# Patient Record
Sex: Female | Born: 1945 | Race: White | Hispanic: No | Marital: Married | Smoking: Former smoker
Health system: Southern US, Community
[De-identification: ages and names within clinical notes are randomized; demographics above are authoritative.]

## PROBLEM LIST (undated history)

## (undated) DIAGNOSIS — D649 Anemia, unspecified: Secondary | ICD-10-CM

## (undated) DIAGNOSIS — F419 Anxiety disorder, unspecified: Secondary | ICD-10-CM

## (undated) DIAGNOSIS — G4730 Sleep apnea, unspecified: Secondary | ICD-10-CM

## (undated) DIAGNOSIS — K219 Gastro-esophageal reflux disease without esophagitis: Secondary | ICD-10-CM

## (undated) DIAGNOSIS — Z9889 Other specified postprocedural states: Secondary | ICD-10-CM

## (undated) DIAGNOSIS — J45909 Unspecified asthma, uncomplicated: Secondary | ICD-10-CM

## (undated) DIAGNOSIS — J302 Other seasonal allergic rhinitis: Secondary | ICD-10-CM

## (undated) DIAGNOSIS — Z993 Dependence on wheelchair: Secondary | ICD-10-CM

## (undated) DIAGNOSIS — R112 Nausea with vomiting, unspecified: Secondary | ICD-10-CM

## (undated) DIAGNOSIS — I10 Essential (primary) hypertension: Secondary | ICD-10-CM

## (undated) HISTORY — DX: Essential (primary) hypertension: I10

## (undated) HISTORY — PX: CHOLECYSTECTOMY: SHX55

## (undated) HISTORY — PX: DILATION AND CURETTAGE OF UTERUS: SHX78

## (undated) HISTORY — PX: APPENDECTOMY: SHX54

## (undated) HISTORY — DX: Other seasonal allergic rhinitis: J30.2

## (undated) HISTORY — PX: TUMOR REMOVAL: SHX12

## (undated) HISTORY — PX: JOINT REPLACEMENT: SHX530

---

## 1991-02-01 HISTORY — PX: PARTIAL HYSTERECTOMY: SHX80

## 2004-07-08 ENCOUNTER — Ambulatory Visit: Payer: Self-pay | Admitting: Obstetrics and Gynecology

## 2005-07-11 ENCOUNTER — Ambulatory Visit: Payer: Self-pay | Admitting: Obstetrics and Gynecology

## 2006-01-31 HISTORY — PX: REPLACEMENT TOTAL KNEE: SUR1224

## 2006-09-25 ENCOUNTER — Ambulatory Visit: Payer: Self-pay | Admitting: General Practice

## 2006-09-25 ENCOUNTER — Other Ambulatory Visit: Payer: Self-pay

## 2006-10-11 ENCOUNTER — Inpatient Hospital Stay: Payer: Self-pay | Admitting: General Practice

## 2007-09-25 ENCOUNTER — Other Ambulatory Visit: Payer: Self-pay

## 2007-09-25 ENCOUNTER — Ambulatory Visit: Payer: Self-pay | Admitting: General Practice

## 2007-10-02 ENCOUNTER — Inpatient Hospital Stay: Payer: Self-pay | Admitting: General Practice

## 2008-02-11 ENCOUNTER — Ambulatory Visit: Payer: Self-pay | Admitting: Internal Medicine

## 2008-03-03 ENCOUNTER — Ambulatory Visit: Payer: Self-pay | Admitting: Internal Medicine

## 2011-09-23 ENCOUNTER — Ambulatory Visit: Payer: Self-pay | Admitting: Internal Medicine

## 2011-10-12 ENCOUNTER — Ambulatory Visit: Payer: Self-pay | Admitting: Cardiology

## 2012-07-30 ENCOUNTER — Ambulatory Visit: Payer: Self-pay | Admitting: Unknown Physician Specialty

## 2012-10-10 ENCOUNTER — Ambulatory Visit: Payer: Self-pay | Admitting: Family Medicine

## 2013-08-09 ENCOUNTER — Ambulatory Visit: Payer: Self-pay | Admitting: Specialist

## 2013-12-24 ENCOUNTER — Ambulatory Visit: Payer: Self-pay | Admitting: Family Medicine

## 2014-03-18 ENCOUNTER — Ambulatory Visit: Payer: Self-pay | Admitting: Family Medicine

## 2014-03-27 ENCOUNTER — Ambulatory Visit (INDEPENDENT_AMBULATORY_CARE_PROVIDER_SITE_OTHER): Payer: Medicare Other | Admitting: Internal Medicine

## 2014-03-27 ENCOUNTER — Encounter: Payer: Self-pay | Admitting: Internal Medicine

## 2014-03-27 ENCOUNTER — Encounter (INDEPENDENT_AMBULATORY_CARE_PROVIDER_SITE_OTHER): Payer: Self-pay

## 2014-03-27 VITALS — BP 124/84 | HR 67 | Temp 97.4°F | Ht 68.0 in | Wt 296.0 lb

## 2014-03-27 DIAGNOSIS — J4520 Mild intermittent asthma, uncomplicated: Secondary | ICD-10-CM

## 2014-03-27 DIAGNOSIS — J45909 Unspecified asthma, uncomplicated: Secondary | ICD-10-CM | POA: Insufficient documentation

## 2014-03-27 DIAGNOSIS — G4733 Obstructive sleep apnea (adult) (pediatric): Secondary | ICD-10-CM | POA: Insufficient documentation

## 2014-03-27 DIAGNOSIS — R918 Other nonspecific abnormal finding of lung field: Secondary | ICD-10-CM

## 2014-03-27 DIAGNOSIS — Z9989 Dependence on other enabling machines and devices: Secondary | ICD-10-CM

## 2014-03-27 NOTE — Patient Instructions (Signed)
Follow up with Dr. Stevenson Clinch in 1 month - methacholine challenge test, pulmonary function test, 6 minute walk test - stop asmanex for now - use albuterol 2puff as needed for shortness of breath\wheezing and coughing every 4-6 hours - if you have to use albuterol for more that 3 consecutive days then restart asmanex 1 inhalation twice per day, rinse and gargle after each use.  - continue with Nystatin swish for thrush.

## 2014-03-31 DIAGNOSIS — R918 Other nonspecific abnormal finding of lung field: Secondary | ICD-10-CM | POA: Insufficient documentation

## 2014-03-31 NOTE — Assessment & Plan Note (Signed)
Encouraged proper weight management.  Excessive weight may contribute to snoring.  Monitor sedative use.  Discussed driving precautions and its relationship with hypersomnolence.  Discussed operating dangerous equipment and its relationship with hypersomnolence.  Discussed sleep hygiene, and benefits of a fixed sleep waked time.  The importance of getting eight or more hours of sleep discussed with patient.  Discussed limiting the use of the computer and television before bedtime.  Decrease naps during the day, so night time sleep will become enhanced.  Limit caffeine, and sleep deprivation.  HTN, stroke, and heart failure are potential risk factors.  Encouraged continued use of CPAP machine 7 nights per week 6-8 hours per night.

## 2014-03-31 NOTE — Progress Notes (Signed)
Date: 03/31/2014  MRN# [id number] Jasmine Morrow 1945/04/29  Referring Physician:   ARDEN AXON is a 69 y.o. old female seen in consultation for establish care of asthma CC: "had asthma in thrush recently" Chief Complaint  Patient presents with  . Advice Only    Pt was dx asthma, she had a repeat ct scan. Pt denies cough, sob, and wheeze. She does have sl. chest tightness.  Synopsis: 69 year old female with clinical diagnosis of asthma, normal spirometry, seen in pulmonary [place] February 2016 for establish care. History of recent mild intermittent asthma, steroid responsive, however, with inhaled steroids gets thrush easily. Current plan: Methacholine challenge testing, pulmonary function tests, 6 minute walk test, when necessary albuterol.  HPI:   She is a pleasant 69 year old female presenting today for establish care visit of a current diagnosis of asthma. History per patient and review of records. Per review of records patient had 4 episodes of coughing and wheezing with recurrent bouts of bronchitis from December 2014 07/02/2013. Patient is being followed by Dr. Vella Kohler who diagnosed her with asthma, based on clinical state and symptoms. The differential was also extension tube dysfunction, chest x-ray did show a questionable tiny left nodular density in June 2015. Patient was referred to ENT for further evaluation, for which she's been following Dr. Tami Ribas allergies and further evaluation of eustactacian tube dysfunction. Patient states that since June she has tried different inhalers for her asthma which include Advair, Symbicort,  Breo.  Patient stated that she got the most clinical benefit from [facility], but would develop thrush in her throat and homicidal her left. Currently she is on Asmanex and nystatin rinse. It is noted that each time she was placed on a steroid inhaler she did have significant improvement cough and wheezing. Patient did have allergy testing performed by ENT, it  was negative, she does have seasonal allergies and is currently on Zyrtec. Patient has a history of previous tobacco use, 2 packs per day x30 years, quit in 1992. She was previously employed doing accounting type work. Patient also has a history of obstructive sleep apnea is currently on CPAP. Patient states that she has not used Asmanex for the past 3 days due to not having any symptoms of shortness of breath, cough, wheezing, also with improvement in thrush of her tongue. Patient states that she uses her CPAP nightly for 4-6 hours. Patient is a recent CT scan done for left upper lobe nodule: New results are significant for stable small subpleural left upper lobe nodule, appears somewhat elongated on the coronal images, there is stable nodular thickening along the right minor fissure, benign appearance, scarring in the lingula has been unchanged, 4 mm groundglass nodule in the left lower lobe is unchanged.  PMHX:   Past Medical History  Diagnosis Date  . Hypertension   . Seasonal allergies    Surgical Hx:  Past Surgical History  Procedure Laterality Date  . Replacement total knee Bilateral 2008    2009  . Cesarean section    . Tumor removal    . Dilation and curettage of uterus     Family Hx:  Family History  Problem Relation Age of Onset  . Bone cancer Mother   . COPD Father    Social Hx:   History  Substance Use Topics  . Smoking status: Former Smoker -- 2.00 packs/day for 30 years    Types: Cigarettes  . Smokeless tobacco: Never Used  . Alcohol Use: No   Medication:  Current Outpatient Rx  Name  Route  Sig  Dispense  Refill  . acetaminophen (TYLENOL) 500 MG tablet   Oral   Take 500 mg by mouth every 6 (six) hours as needed.         Marland Kitchen albuterol (PROVENTIL HFA;VENTOLIN HFA) 108 (90 BASE) MCG/ACT inhaler   Inhalation   Inhale 2 puffs into the lungs every 4 (four) hours as needed.         Marland Kitchen aspirin EC 81 MG tablet   Oral   Take 1 tablet by mouth daily.          Marland Kitchen BREO ELLIPTA 100-25 MCG/INH AEPB   Inhalation   Inhale 100 mcg into the lungs daily.      0     Dispense as written.   . Calcium-Vitamin D 600-200 MG-UNIT per tablet   Oral   Take 1 tablet by mouth 2 (two) times daily.         . carvedilol (COREG) 6.25 MG tablet   Oral   Take 1 tablet by mouth daily.      0   . Cetirizine HCl 10 MG CAPS   Oral   Take 1 tablet by mouth daily.         Marland Kitchen estradiol (ESTRACE) 0.1 MG/GM vaginal cream   Vaginal   Place 2 g vaginally 2 (two) times a week.         . fluticasone (FLONASE) 50 MCG/ACT nasal spray   Each Nare   Place 1 spray into both nostrils daily.         Marland Kitchen triamcinolone ointment (KENALOG) 0.1 %   Topical   Apply 1 application topically 2 (two) times daily.         Marland Kitchen triamterene-hydrochlorothiazide (MAXZIDE-25) 37.5-25 MG per tablet   Oral   Take 1 tablet by mouth daily.      0       Allergies:  Celecoxib; Hydrocodone-acetaminophen; Meloxicam; Morphine sulfate er beads; Naproxen; Other; Rofecoxib; Ciprofloxacin-hydrocortisone; Nickel; and Tramadol-acetaminophen  Review of Systems: Gen:  Denies  fever, sweats, chills HEENT: Denies blurred vision, double vision, ear pain, eye pain, hearing loss, nose bleeds, sore throat Cvc:  No dizziness, chest pain or heaviness Resp:   Intermittent cough and shortness of breath Gi: Denies swallowing difficulty, stomach pain, nausea or vomiting, diarrhea, constipation, bowel incontinence Gu:  Denies bladder incontinence, burning urine Ext:   No Joint pain, stiffness or swelling Skin: No skin rash, easy bruising or bleeding or hives Endoc:  No polyuria, polydipsia , polyphagia or weight change Psych: No depression, insomnia or hallucinations  Other:  All other systems negative  Physical Examination:   VS: BP 124/84 mmHg  Pulse 67  Temp(Src) 97.4 F (36.3 C) (Oral)  Ht 5\' 8"  (1.727 m)  Wt 296 lb (134.265 kg)  BMI 45.02 kg/m2  SpO2 98%  General Appearance: No  distress  Neuro:without focal findings, mental status, speech normal, alert and oriented, cranial nerves 2-12 intact, reflexes normal and symmetric, sensation grossly normal  HEENT: PERRLA, EOM intact, no ptosis, no other lesions noticed; Mallampati 2.  Tongue: Mild thrush left lateral tongue Pulmonary: normal breath sounds., diaphragmatic excursion normal.No wheezing, No rales;   Sputum Production:  None CardiovascularNormal S1,S2.  No m/r/g.  Abdominal aorta pulsation normal.    Abdomen: Benign, Soft, non-tender, No masses, hepatosplenomegaly, No lymphadenopathy Renal:  No costovertebral tenderness  GU:  No performed at this time. Endoc: No evident thyromegaly, no signs of acromegaly or Cushing  features Skin:   warm, no rashes, no ecchymosis  Extremities: normal, no cyanosis, clubbing, no edema, warm with normal capillary refill. Other findings:None    Rad results: (The following images and results were reviewed by Dr. Stevenson Clinch). CT chest June 2015 FINDINGS: There is an indeterminate approximately 6 mm noncalcified nodule within the left upper lobe (image 20, series 4). There is a less well-defined approximately 4 mm ground-glass nodule within the right upper lobe (image 31, series 4).  There is minimal subsegmental atelectasis within the medial aspect of the left upper lobe (image 27, series 4 as with well as within the medial basilar segment of the right lower lobe (representative image 33, series 4). No discrete focal airspace opacities. No pleural effusion or pneumothorax. The central pulmonary airways are widely patent. No mediastinal, hilar axillary lymphadenopathy on this noncontrast examination.  Normal heart size. Minimal coronary artery calcifications. No pericardial effusion. Normal caliber of the thoracic aorta.  Limited noncontrast evaluation of the upper abdomen demonstrates a small splenule within the splenic hilum but is otherwise unremarkable.  No acute or  aggressive osseous abnormalities. Stigmata of dish within the caudal aspect of the thoracic spine.  There is an approximately 2.4 x 2.2 x 1.8 cm hypo attenuating nodule arising from the inferior medial aspect of the left lobe of the thyroid/thyroid isthmus (axial image 6, series 2, coronal image 74, series 5).  IMPRESSION: 1. No acute cardiopulmonary disease. 2. Indeterminate bilateral pulmonary nodules the largest of which within the left upper lobe measures approximately 6 mm in diameter. If the patient is at high risk for bronchogenic carcinoma, follow-up chest CT at 6-12 months is recommended. If the patient is at low risk for bronchogenic carcinoma, follow-up chest CT at 12 months is recommended. This recommendation follows the consensus statement: Guidelines for Management of Small Pulmonary Nodules Detected on CT Scans: A Statement from the [place] as published in Radiology 2005;237:395-400. 3. Suspected approximately 2.4 cm indeterminate nodule arising from the inferior medial aspect of the left lobe of the thyroid/thyroid isthmus. Further evaluation could be performed with dedicated thyroid ultrasound as clinically indicated.  CT chest February 2016 EXAM: CT CHEST WITHOUT CONTRAST  TECHNIQUE: Multidetector CT imaging of the chest was performed following the standard protocol without IV contrast..  COMPARISON: Radiographs 09/25/2006. Chest CT 08/09/2013.  FINDINGS: Mediastinum/Nodes: There are no enlarged mediastinal, hilar or axillary lymph nodes. 1.6 cm low-density lesion involving the left thyroid isthmus on image number 7 is similar to the prior study. The trachea and esophagus demonstrate no significant findings. The heart size is normal. There is no pericardial effusion.Stable mild atherosclerosis of the aorta, great vessels and coronary arteries.  Lungs/Pleura: There is no pleural effusion. Stable small subpleural left upper lobe nodule on images  20 and 21. This appears somewhat elongated on the coronal images. There is stable nodular thickening along the right minor fissure, characteristically benign. Linear scarring in the lingula is unchanged. 4 mm ground-glass nodule in the left lower lobe on image 45 is also unchanged. No new or enlarging nodules identified.  Upper abdomen: Hepatic steatosis. No adrenal mass or acute findings.  Musculoskeletal/Chest wall: There is no chest wall mass or suspicious osseous finding.  IMPRESSION: 1. Stable linear scarring and mild nodularity in both lungs compared with prior study from 7 months ago. 2. Follow-up noncontrast CT in 1 year recommended to better assess stability. This recommendation follows the consensus statement: Guidelines for Management of Small Pulmonary Nodules Detected on CT Scans: A Statement  from the [place] as published in Radiology 2005; 237:395-400. 3. No acute findings or adenopathy. 4. Stable low-density lesion in the thyroid isthmus. 5. Hepatic steatosis.      Other:  07/30/13 SPIROMETRY: FVC was 3.44 liters, 115% of predicted FEV1 was 2.81, 118% of predicted FEV1 ratio was 82 FEF 25-75% liters per second was 130% of predicted Interpretation: normal spirometry  Assessment and Plan: Multiple lung nodules Patient with subcentimeter nodules. Stable nodularity in size and characteristic over the past one year,Will plan for followup CAT scan in one year   OSA on CPAP Encouraged proper weight management.  Excessive weight may contribute to snoring.  Monitor sedative use.  Discussed driving precautions and its relationship with hypersomnolence.  Discussed operating dangerous equipment and its relationship with hypersomnolence.  Discussed sleep hygiene, and benefits of a fixed sleep waked time.  The importance of getting eight or more hours of sleep discussed with patient.  Discussed limiting the use of the computer and television before bedtime.   Decrease naps during the day, so night time sleep will become enhanced.  Limit caffeine, and sleep deprivation.  HTN, stroke, and heart failure are potential risk factors.  Encouraged continued use of CPAP machine 7 nights per week 6-8 hours per night.     Extrinsic asthma Based on history patient most likely has asthma which is developing later in life, however this could be mixed with a component of COPD. On her most recent CT chest there is no noted structural disease to suggest emphysema bronchiectasis. Asthma seems very likely given her improvement in clinical symptoms when she is using an inhaled corticosteroid.  However, her symptoms have been very mild recently and with the use of the ICS she develops thrush fairly quickly even with proper mouth care. At this time. Inhale corticosteroid and downgrade treatment to as needed rescue inhaler and perform bronchoprovocation testing.  Plan: -Methacholine challenge testing, pulmonary function testing, 6 minute walk test -albuterol 2puff as needed for shortness of breath\wheezing and coughing every 4-6 hours -continue with nystatin rinse for 3 days after thrush has cleared.      Updated Medication List Outpatient Encounter Prescriptions as of 03/27/2014  Medication Sig  . acetaminophen (TYLENOL) 500 MG tablet Take 500 mg by mouth every 6 (six) hours as needed.  Marland Kitchen albuterol (PROVENTIL HFA;VENTOLIN HFA) 108 (90 BASE) MCG/ACT inhaler Inhale 2 puffs into the lungs every 4 (four) hours as needed.  Marland Kitchen aspirin EC 81 MG tablet Take 1 tablet by mouth daily.  Marland Kitchen BREO ELLIPTA 100-25 MCG/INH AEPB Inhale 100 mcg into the lungs daily.  . Calcium-Vitamin D 600-200 MG-UNIT per tablet Take 1 tablet by mouth 2 (two) times daily.  . carvedilol (COREG) 6.25 MG tablet Take 1 tablet by mouth daily.  . Cetirizine HCl 10 MG CAPS Take 1 tablet by mouth daily.  Marland Kitchen estradiol (ESTRACE) 0.1 MG/GM vaginal cream Place 2 g vaginally 2 (two) times a week.  .  fluticasone (FLONASE) 50 MCG/ACT nasal spray Place 1 spray into both nostrils daily.  Marland Kitchen triamcinolone ointment (KENALOG) 0.1 % Apply 1 application topically 2 (two) times daily.  Marland Kitchen triamterene-hydrochlorothiazide (MAXZIDE-25) 37.5-25 MG per tablet Take 1 tablet by mouth daily.    Orders for this visit: No orders of the defined types were placed in this encounter.     Thank  you for the consultation and for allowing [facility] to assist in the care of your patient. Our recommendations are noted above.  Please contact  us if we can be of further service.   Vilinda Boehringer, MD [facility] Office Number: [phone]

## 2014-03-31 NOTE — Assessment & Plan Note (Signed)
Patient with subcentimeter nodules. Stable nodularity in size and characteristic over the past one year,Will plan for followup CAT scan in one year

## 2014-03-31 NOTE — Assessment & Plan Note (Addendum)
Based on history patient most likely has asthma which is developing later in life, however this could be mixed with a component of COPD. On her most recent CT chest there is no noted structural disease to suggest emphysema bronchiectasis. Asthma seems very likely given her improvement in clinical symptoms when she is using an inhaled corticosteroid.  However, her symptoms have been very mild recently and with the use of the ICS she develops thrush fairly quickly even with proper mouth care. At this time. Inhale corticosteroid and downgrade treatment to as needed rescue inhaler and perform bronchoprovocation testing.  Plan: -Methacholine challenge testing, pulmonary function testing, 6 minute walk test -albuterol 2puff as needed for shortness of breath\wheezing and coughing every 4-6 hours -continue with nystatin rinse for 3 days after thrush has cleared.

## 2014-04-02 ENCOUNTER — Telehealth: Payer: Self-pay | Admitting: *Deleted

## 2014-04-02 ENCOUNTER — Encounter: Payer: Self-pay | Admitting: Internal Medicine

## 2014-04-02 DIAGNOSIS — J4520 Mild intermittent asthma, uncomplicated: Secondary | ICD-10-CM

## 2014-04-02 NOTE — Telephone Encounter (Signed)
Error

## 2014-04-17 ENCOUNTER — Ambulatory Visit: Payer: Self-pay | Admitting: Internal Medicine

## 2014-04-17 LAB — PULMONARY FUNCTION TEST

## 2014-04-28 ENCOUNTER — Ambulatory Visit: Payer: Medicare Other | Admitting: Internal Medicine

## 2014-05-08 ENCOUNTER — Encounter: Payer: Self-pay | Admitting: Internal Medicine

## 2014-05-09 ENCOUNTER — Other Ambulatory Visit: Payer: Self-pay | Admitting: *Deleted

## 2014-05-09 DIAGNOSIS — J4520 Mild intermittent asthma, uncomplicated: Secondary | ICD-10-CM

## 2014-05-13 ENCOUNTER — Telehealth: Payer: Self-pay | Admitting: *Deleted

## 2014-05-13 ENCOUNTER — Ambulatory Visit (INDEPENDENT_AMBULATORY_CARE_PROVIDER_SITE_OTHER): Payer: Medicare Other | Admitting: Internal Medicine

## 2014-05-13 ENCOUNTER — Encounter: Payer: Self-pay | Admitting: Internal Medicine

## 2014-05-13 VITALS — BP 120/80 | HR 70 | Ht 68.0 in | Wt 300.0 lb

## 2014-05-13 DIAGNOSIS — R0600 Dyspnea, unspecified: Secondary | ICD-10-CM | POA: Diagnosis not present

## 2014-05-13 DIAGNOSIS — R918 Other nonspecific abnormal finding of lung field: Secondary | ICD-10-CM

## 2014-05-13 DIAGNOSIS — Z9989 Dependence on other enabling machines and devices: Secondary | ICD-10-CM

## 2014-05-13 DIAGNOSIS — G4733 Obstructive sleep apnea (adult) (pediatric): Secondary | ICD-10-CM

## 2014-05-13 DIAGNOSIS — J4520 Mild intermittent asthma, uncomplicated: Secondary | ICD-10-CM

## 2014-05-13 DIAGNOSIS — R0602 Shortness of breath: Secondary | ICD-10-CM

## 2014-05-13 LAB — PULMONARY FUNCTION TEST
DL/VA % PRED: 97 %
DL/VA: 5.11 ml/min/mmHg/L
DLCO UNC % PRED: 100 %
DLCO UNC: 29.75 ml/min/mmHg
FEF 25-75 Post: 1.53 L/sec
FEF 25-75 Pre: 3.56 L/sec
FEF2575-%Change-Post: -56 %
FEF2575-%PRED-PRE: 159 %
FEF2575-%Pred-Post: 68 %
FEV1-%CHANGE-POST: -27 %
FEV1-%Pred-Post: 73 %
FEV1-%Pred-Pre: 101 %
FEV1-PRE: 2.76 L
FEV1-Post: 2 L
FEV1FVC-%Change-Post: -25 %
FEV1FVC-%Pred-Pre: 110 %
FEV6-%Change-Post: -2 %
FEV6-%PRED-POST: 93 %
FEV6-%Pred-Pre: 95 %
FEV6-POST: 3.2 L
FEV6-Pre: 3.28 L
FEV6FVC-%PRED-POST: 104 %
FEV6FVC-%Pred-Pre: 104 %
FVC-%Change-Post: -2 %
FVC-%PRED-PRE: 91 %
FVC-%Pred-Post: 89 %
FVC-POST: 3.2 L
FVC-Pre: 3.28 L
POST FEV6/FVC RATIO: 100 %
Post FEV1/FVC ratio: 62 %
Pre FEV1/FVC ratio: 84 %
Pre FEV6/FVC Ratio: 100 %
RV % pred: 91 %
RV: 2.16 L
TLC % pred: 102 %
TLC: 5.79 L

## 2014-05-13 NOTE — Assessment & Plan Note (Signed)
Patient with positive methacholine challenge testing, currently with no obstruction of a pulmonary function test. She does have some mild cough and postnasal drip secondary to allergies for which she is on Zyrtec. Her ENT had previously giving her non-powder inhalers, Asmanex, for which she will start using given home confirmed diagnosis of asthma with a positive methacholine challenge testing. I believe during allergy season her asthma may need controller medication otherwise she is very mild intermittent asthma and only may need a bronchodilator such as albuterol as needed.  Plan: -Asmanex 1 puff twice a day, during high allergy season, otherwise will plan for as needed bronchodilator otherwise. -Continue with allergy control with Zyrtec

## 2014-05-13 NOTE — Progress Notes (Signed)
PFT performed today. 

## 2014-05-13 NOTE — Progress Notes (Signed)
SMW performed today. 

## 2014-05-13 NOTE — Assessment & Plan Note (Signed)
Encouraged proper weight management.  Excessive weight may contribute to snoring.  Monitor sedative use.  Discussed driving precautions and its relationship with hypersomnolence.  Discussed operating dangerous equipment and its relationship with hypersomnolence.  Discussed sleep hygiene, and benefits of a fixed sleep waked time.  The importance of getting eight or more hours of sleep discussed with patient.  Discussed limiting the use of the computer and television before bedtime.  Decrease naps during the day, so night time sleep will become enhanced.  Limit caffeine, and sleep deprivation.  HTN, stroke, and heart failure are potential risk factors.  Encouraged continued use of CPAP machine 7 nights per week 6-8 hours per night.

## 2014-05-13 NOTE — Assessment & Plan Note (Signed)
Patient with subcentimeter nodules. Stable nodularity in size and characteristic over the past one year,Will plan for followup CAT scan in 03/2015

## 2014-05-13 NOTE — Progress Notes (Signed)
MRN# [id number] Jasmine Morrow Jul 22, 1945   CC: Chief Complaint  Patient presents with  . Follow-up    PFT results; SOB at times w/activity      Brief History: Synopsis: 69 year old female with clinical diagnosis of asthma, normal spirometry, seen in pulmonary [place] February 2016 for establish care. History of recent mild intermittent asthma, steroid responsive, however, with inhaled steroids gets thrush easily. Current plan: Methacholine challenge testing, pulmonary function tests, 6 minute walk test, when necessary albuterol.  Brief HPI 03/31/14:She is a pleasant 69 year old female presenting today for establish care visit of a current diagnosis of asthma. History per patient and review of records. 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  Hx of seasonal allergies currently on Zyrtec.  Develops thrush with different inhalers. Currently on intermittent Asmanex  Events since last clinic visit: Bains presents today for follow-up visit of her suspected asthma, and cough. Since her last visit she's had a methacholine challenge test, 6 minute walk test, pulmonary function tests for which she would like to discuss today. Today she endorses that her allergies are acting up and she does have a mild cough, and "tickle" in her throat. Patient is currently using Zyrtec daily.   PMHX:   Past Medical History  Diagnosis Date  . Hypertension   . Seasonal allergies    Surgical Hx:  Past Surgical History  Procedure Laterality Date  . Replacement total knee Bilateral 2008    2009  . Cesarean section    . Tumor removal    . Dilation and curettage of uterus     Family Hx:  Family History  Problem Relation Age of Onset  . Bone cancer Mother   . COPD Father    Social Hx:   History  Substance Use Topics  . Smoking status: Former Smoker -- 2.00 packs/day for 30 years    Types: Cigarettes   . Smokeless tobacco: Never Used  . Alcohol Use: No   Medication:   Current Outpatient Rx  Name  Route  Sig  Dispense  Refill  . acetaminophen (TYLENOL) 500 MG tablet   Oral   Take 500 mg by mouth every 6 (six) hours as needed.         Marland Kitchen albuterol (PROVENTIL HFA;VENTOLIN HFA) 108 (90 BASE) MCG/ACT inhaler   Inhalation   Inhale 2 puffs into the lungs every 4 (four) hours as needed.         Marland Kitchen aspirin EC 81 MG tablet   Oral   Take 1 tablet by mouth daily.         Marland Kitchen BREO ELLIPTA 100-25 MCG/INH AEPB   Inhalation   Inhale 100 mcg into the lungs daily.      0     Dispense as written.   . Calcium-Vitamin D 600-200 MG-UNIT per tablet   Oral   Take 1 tablet by mouth 2 (two) times daily.         . carvedilol (COREG) 6.25 MG tablet   Oral   Take 1 tablet by mouth daily.      0   . Cetirizine HCl 10 MG CAPS   Oral   Take 1 tablet by mouth daily.         Marland Kitchen estradiol (ESTRACE) 0.1 MG/GM vaginal cream   Vaginal   Place 2 g vaginally 2 (two) times a week.         Marland Kitchen  fluticasone (FLONASE) 50 MCG/ACT nasal spray   Each Nare   Place 1 spray into both nostrils daily.         Marland Kitchen triamcinolone ointment (KENALOG) 0.1 %   Topical   Apply 1 application topically 2 (two) times daily.         Marland Kitchen triamterene-hydrochlorothiazide (MAXZIDE-25) 37.5-25 MG per tablet   Oral   Take 1 tablet by mouth daily.      0      Review of Systems: Gen:  Denies  fever, sweats, chills HEENT: Denies blurred vision, double vision, ear pain, eye pain, hearing loss, nose bleeds, sore throat Cvc:  No dizziness, chest pain or heaviness Resp:   Mild cough and throat clearing.  Gi: Denies swallowing difficulty, stomach pain, nausea or vomiting, diarrhea, constipation, bowel incontinence Gu:  Denies bladder incontinence, burning urine Ext:   No Joint pain, stiffness or swelling Skin: No skin rash, easy bruising or bleeding or hives Endoc:  No polyuria, polydipsia , polyphagia or weight  change Psych: No depression, insomnia or hallucinations  Other:  All other systems negative  Allergies:  Celecoxib; Hydrocodone-acetaminophen; Meloxicam; Morphine sulfate er beads; Naproxen; Other; Rofecoxib; Ciprofloxacin-hydrocortisone; Nickel; and Tramadol-acetaminophen  Physical Examination:  VS: BP 120/80 mmHg  Pulse 70  Ht 5\' 8"  (1.727 m)  Wt 300 lb (136.079 kg)  BMI 45.63 kg/m2  SpO2 99%  General Appearance: No distress  Neuro: EXAM: without focal findings, mental status, speech normal, alert and oriented, cranial nerves 2-12 grossly normal  HEENT: PERRLA, EOM intact, no ptosis, no other lesions noticed Pulmonary:Exam: normal breath sounds., diaphragmatic excursion normal.No wheezing, No rales   Cardiovascular:@ Exam:  Normal S1,S2.  No m/r/g.     Abdomen:Exam: Benign, Soft, non-tender, No masses  Skin:   warm, no rashes, no ecchymosis  Extremities: normal, no cyanosis, clubbing, no edema, warm with normal capillary refill.   Labs results:  BMP No results found for: NA, K, CL, CO2, GLUCOSE, BUN, CREATININE   CBC No flowsheet data found.   Methacholine Challenge: Spirometry: FVC: 3.4L 102%predicted FEV1: 2.4L 101%predicted FEV1/FVC:.   Comments: Patient Underwent Methacholine Challenge testing and patient was given subsequent doses to assess for reactive airways and a change form baseline FEV1. It is evidecnt that there was a significant change in FEV1 after 4th dose.   Assessment/Impression: This suggests Positive Methacholine [place] Test.   Pulmonary function testing 05/13/2014 FVC 91% FEV1 101% FEV1/FVC 84, 110% ERV 24 DLCO 100 Impression: Normal PFTs, no obstruction or restriction.  6 minute walk test-258 m/846 feet no desaturations below 88%, highest heart rate 82.  Assessment and Plan:68 yo female presents for follow up of cough and dyspnea. Extrinsic asthma Patient with positive methacholine challenge testing, currently with no obstruction of a  pulmonary function test. She does have some mild cough and postnasal drip secondary to allergies for which she is on Zyrtec. Her ENT had previously giving her non-powder inhalers, Asmanex, for which she will start using given home confirmed diagnosis of asthma with a positive methacholine challenge testing. I believe during allergy season her asthma may need controller medication otherwise she is very mild intermittent asthma and only may need a bronchodilator such as albuterol as needed.  Plan: -Asmanex 1 puff twice a day, during high allergy season, otherwise will plan for as needed bronchodilator otherwise. -Continue with allergy control with Zyrtec     OSA on CPAP Encouraged proper weight management.  Excessive weight may contribute to snoring.  Monitor sedative use.  Discussed driving precautions and its relationship with hypersomnolence.  Discussed operating dangerous equipment and its relationship with hypersomnolence.  Discussed sleep hygiene, and benefits of a fixed sleep waked time.  The importance of getting eight or more hours of sleep discussed with patient.  Discussed limiting the use of the computer and television before bedtime.  Decrease naps during the day, so night time sleep will become enhanced.  Limit caffeine, and sleep deprivation.  HTN, stroke, and heart failure are potential risk factors.  Encouraged continued use of CPAP machine 7 nights per week 6-8 hours per night.       Multiple lung nodules Patient with subcentimeter nodules. Stable nodularity in size and characteristic over the past one year,Will plan for followup CAT scan in 03/2015       Updated Medication List Outpatient Encounter Prescriptions as of 05/13/2014  Medication Sig  . acetaminophen (TYLENOL) 500 MG tablet Take 500 mg by mouth every 6 (six) hours as needed.  Marland Kitchen albuterol (PROVENTIL HFA;VENTOLIN HFA) 108 (90 BASE) MCG/ACT inhaler Inhale 2 puffs into the lungs every 4 (four) hours  as needed.  Marland Kitchen aspirin EC 81 MG tablet Take 1 tablet by mouth daily.  . Calcium-Vitamin D 600-200 MG-UNIT per tablet Take 1 tablet by mouth 2 (two) times daily.  . carvedilol (COREG) 6.25 MG tablet Take 1 tablet by mouth daily.  . Cetirizine HCl 10 MG CAPS Take 1 tablet by mouth daily.  Marland Kitchen estradiol (ESTRACE) 0.1 MG/GM vaginal cream Place 2 g vaginally 2 (two) times a week.  . triamcinolone ointment (KENALOG) 0.1 % Apply 1 application topically 2 (two) times daily.  Marland Kitchen triamterene-hydrochlorothiazide (MAXZIDE-25) 37.5-25 MG per tablet Take 1 tablet by mouth daily.  . [DISCONTINUED] BREO ELLIPTA 100-25 MCG/INH AEPB Inhale 100 mcg into the lungs daily.  . [DISCONTINUED] fluticasone (FLONASE) 50 MCG/ACT nasal spray Place 1 spray into both nostrils daily.    Orders for this visit: No orders of the defined types were placed in this encounter.    Thank  you for the visitation and for allowing  [facility] to assist in the care of your patient. Our recommendations are noted above.  Please contact us if we can be of further service.  Vilinda Boehringer, MD [facility] Office Number: [phone]

## 2014-05-13 NOTE — Telephone Encounter (Signed)
PFT order placed

## 2014-05-13 NOTE — Patient Instructions (Signed)
Follow up with Dr. Stevenson Clinch in 3 months - you have a positive Methacholine Challenge test indicative of Asthma - allergy control will be also paramount to controlling your asthma - you have normal pulmonary function testing  - call our office back once you get home and know what samples you have and would like to use for asthma - diet, weight loss, exercise will add benefit to your overall breathing.

## 2014-06-11 ENCOUNTER — Encounter: Payer: Self-pay | Admitting: Internal Medicine

## 2014-08-11 ENCOUNTER — Ambulatory Visit (INDEPENDENT_AMBULATORY_CARE_PROVIDER_SITE_OTHER): Payer: Medicare Other | Admitting: Internal Medicine

## 2014-08-11 ENCOUNTER — Encounter: Payer: Self-pay | Admitting: Internal Medicine

## 2014-08-11 VITALS — BP 136/80 | HR 83 | Temp 97.8°F | Wt 302.0 lb

## 2014-08-11 DIAGNOSIS — J4520 Mild intermittent asthma, uncomplicated: Secondary | ICD-10-CM

## 2014-08-11 MED ORDER — MOMETASONE FUROATE 110 MCG/INH IN AEPB: 1.0000 | INHALATION_SPRAY | Freq: Every day | RESPIRATORY_TRACT | Status: DC

## 2014-08-11 NOTE — Assessment & Plan Note (Signed)
Patient with positive methacholine challenge testing, currently with no obstruction of a pulmonary function test. She does have some mild cough and postnasal drip secondary to allergies for which she is on Zyrtec. Her ENT had previously giving her non-powder inhalers, Asmanex, for which she will start using given home confirmed diagnosis of asthma with a positive methacholine challenge testing.  Given weather changes and increasing dyspnea and cough, advised patient to start Asmanex 1 puff daily for one month also to gargle and rinse with a Chloraseptic after each use Continue with allergy control with Zyrtec Obtain oral ulcer biopsy report from oral surgeon (Dr. Donzetta Matters)

## 2014-08-11 NOTE — Progress Notes (Signed)
MRN# [id number] Jasmine Morrow [birth year]   CC: Chief Complaint  Patient presents with  . Follow-up    SOB worse in am but gets better throughout day; chest heaviness; prod cough w/green mucus at times      Brief History: Synopsis: 69 year old female with clinical diagnosis of asthma, normal spirometry, seen in pulmonary [place] February 2016 for establish care. History of recent mild intermittent asthma, steroid responsive, however, with inhaled steroids gets thrush easily. Current plan: Methacholine challenge testing, pulmonary function tests, 6 minute walk test, when necessary albuterol.  Brief HPI 03/31/14:She is a pleasant 69 year old female presenting today for establish care visit of a current diagnosis of asthma. History per patient and review of records. 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 Hx of seasonal allergies currently on Zyrtec. Develops thrush with different inhalers. Currently on intermittent Asmanex  ROV 05/13/14: Sukhu presents today for follow-up visit of her suspected asthma, and cough. Since her last visit she's had a methacholine challenge test, 6 minute walk test, pulmonary function tests for which she would like to discuss today. Today she endorses that her allergies are acting up and she does have a mild cough, and "tickle" in her throat. Patient is currently using Zyrtec daily. Plan: Encouraged continued use of CPAP machine 7 nights per week 6-8 hours per night. Stable nodularity in size and characteristic over the past one year,Will plan for followup CAT scan in 03/2015 Asmanex 1 puff twice a day, during high allergy season, otherwise will plan for as needed bronchodilator otherwise. ontinue with allergy control with Zyrtec  Events since last clinic visit:   Patient presents today for a follow-up visit of her asthma. She states since her last visit  she's been doing very well, she is not had use for albuterol or from Asmanex. 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 Patient states since the weather has been erratic in nature, she has had some morning cough that is intermittent of greenish sputum and mild shortness of breath, but this improves throughout the day.   Medication:   Current Outpatient Rx  Name  Route  Sig  Dispense  Refill  . acetaminophen (TYLENOL) 500 MG tablet   Oral   Take 500 mg by mouth every 6 (six) hours as needed.         Marland Kitchen albuterol (PROVENTIL HFA;VENTOLIN HFA) 108 (90 BASE) MCG/ACT inhaler   Inhalation   Inhale 2 puffs into the lungs every 4 (four) hours as needed.         Marland Kitchen aspirin EC 81 MG tablet   Oral   Take 1 tablet by mouth daily.         . Calcium-Vitamin D 600-200 MG-UNIT per tablet   Oral   Take 1 tablet by mouth 2 (two) times daily.         . carvedilol (COREG) 6.25 MG tablet   Oral   Take 1 tablet by mouth daily.      0   . Cetirizine HCl 10 MG CAPS   Oral   Take 1 tablet by mouth daily.         Marland Kitchen  estradiol (ESTRACE) 0.1 MG/GM vaginal cream   Vaginal   Place 2 g vaginally 2 (two) times a week.         . triamcinolone ointment (KENALOG) 0.1 %   Topical   Apply 1 application topically 2 (two) times daily.         Marland Kitchen triamterene-hydrochlorothiazide (MAXZIDE-25) 37.5-25 MG per tablet   Oral   Take 1 tablet by mouth daily.      0   . UNABLE TO FIND      Med Name:Asidis (Vitamin supplement for eyes) Take BID         . Mometasone Furoate 110 MCG/INH AEPB   Inhalation   Inhale 1 puff into the lungs daily.   1 Inhaler   3      Review of Systems: Gen:  Denies  fever, sweats,  chills HEENT: Denies blurred vision, double vision, ear pain, eye pain, hearing loss, nose bleeds, sore throat Cvc:  No dizziness, chest pain or heaviness Resp:   Admits to: Mild shortness of breath, intermittent productive off Gi: Denies swallowing difficulty, stomach pain, nausea or vomiting, diarrhea, constipation, bowel incontinence Gu:  Denies bladder incontinence, burning urine Ext:   No Joint pain, stiffness or swelling Skin: No skin rash, easy bruising or bleeding or hives Endoc:  No polyuria, polydipsia , polyphagia or weight change Other:  All other systems negative  Allergies:  Celecoxib; Hydrocodone-acetaminophen; Meloxicam; Morphine sulfate er beads; Naproxen; Other; Rofecoxib; Ciprofloxacin-hydrocortisone; Nickel; and Tramadol-acetaminophen  Physical Examination:  VS: BP 136/80 mmHg  Pulse 83  Temp(Src) 97.8 F (36.6 C) (Oral)  Wt 302 lb (136.986 kg)  SpO2 97%  General Appearance: No distress  HEENT: PERRLA, no ptosis, no other lesions noticed Pulmonary:normal breath sounds., diaphragmatic excursion normal.No wheezing, No rales   Cardiovascular:  Normal S1,S2.  No m/r/g.     Abdomen:Exam: Benign, Soft, non-tender, No masses  Skin:   warm, no rashes, no ecchymosis  Extremities: normal, no cyanosis, clubbing, warm with normal capillary refill.         Assessment and Plan: 69 year old female seen in follow-up for asthma Extrinsic asthma Patient with positive methacholine challenge testing, currently with no obstruction of a pulmonary function test. She does have some mild cough and postnasal drip secondary to allergies for which she is on Zyrtec. Her ENT had previously giving her non-powder inhalers, Asmanex, for which she will start using given home confirmed diagnosis of asthma with a positive methacholine challenge testing.  Given weather changes and increasing dyspnea and cough, advised patient to start Asmanex 1 puff daily for one month also to gargle and rinse  with a Chloraseptic after each use Continue with allergy control with Zyrtec Obtain oral ulcer biopsy report from oral surgeon (Dr. Donzetta Matters)        Updated Medication List Outpatient Encounter Prescriptions as of 08/11/2014  Medication Sig  . acetaminophen (TYLENOL) 500 MG tablet Take 500 mg by mouth every 6 (six) hours as needed.  Marland Kitchen albuterol (PROVENTIL HFA;VENTOLIN HFA) 108 (90 BASE) MCG/ACT inhaler Inhale 2 puffs into the lungs every 4 (four) hours as needed.  Marland Kitchen aspirin EC 81 MG tablet Take 1 tablet by mouth daily.  . Calcium-Vitamin D 600-200 MG-UNIT per tablet Take 1 tablet by mouth 2 (two) times daily.  . carvedilol (COREG) 6.25 MG tablet Take 1 tablet by mouth daily.  . Cetirizine HCl 10 MG CAPS Take 1 tablet by mouth daily.  Marland Kitchen estradiol (ESTRACE) 0.1 MG/GM vaginal cream Place 2 g vaginally  2 (two) times a week.  . triamcinolone ointment (KENALOG) 0.1 % Apply 1 application topically 2 (two) times daily.  Marland Kitchen triamterene-hydrochlorothiazide (MAXZIDE-25) 37.5-25 MG per tablet Take 1 tablet by mouth daily.  Marland Kitchen UNABLE TO FIND Med Name:Asidis (Vitamin supplement for eyes) Take BID  . Mometasone Furoate 110 MCG/INH AEPB Inhale 1 puff into the lungs daily.   No facility-administered encounter medications on file as of 08/11/2014.    Orders for this visit: No orders of the defined types were placed in this encounter.    Thank  you for the visitation and for allowing  [facility] to assist in the care of your patient. Our recommendations are noted above.  Please contact us if we can be of further service.  Vilinda Boehringer, MD [facility] Office Number: [phone]

## 2014-08-11 NOTE — Patient Instructions (Signed)
Follow up with Dr. Stevenson Clinch 3 months - since allergy season has picked up and the humidity, take Asmanex 159mcg 1 puff daily for 1 month, gargle and rinse after each use - we will obtain a copy of your biopsy results from Dr. Donzetta Matters (oral surgeon). - continue with diet and exercise.

## 2014-11-20 ENCOUNTER — Encounter: Payer: Self-pay | Admitting: Internal Medicine

## 2014-11-20 ENCOUNTER — Ambulatory Visit (INDEPENDENT_AMBULATORY_CARE_PROVIDER_SITE_OTHER): Payer: Medicare Other | Admitting: Internal Medicine

## 2014-11-20 VITALS — BP 138/84 | HR 64 | Wt 301.0 lb

## 2014-11-20 DIAGNOSIS — J4520 Mild intermittent asthma, uncomplicated: Secondary | ICD-10-CM | POA: Diagnosis not present

## 2014-11-20 NOTE — Progress Notes (Signed)
[facility] Consultation      MRN# [id number] Jasmine Morrow December 08, 1945   CC: Chief Complaint  Patient presents with  . Follow-up    doing well; Asmanex makes her worse.      Brief History: Synopsis: 69 year old female with clinical diagnosis of asthma, normal spirometry, seen in pulmonary [place] February 2016 for establish care. History of recent mild intermittent asthma, steroid responsive, however, with inhaled steroids gets thrush easily. Positive [facility] test, normal PFTs. Started on Asmanex.  Clinicaly with Asthma, mostly during allergy season.   Brief HPI 03/31/14:She is a pleasant 69 year old female presenting today for establish care visit of a current diagnosis of asthma. History per patient and review of records. 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 Hx of seasonal allergies currently on Zyrtec. Develops thrush with different inhalers. Currently on intermittent Asmanex  ROV 05/13/14 Ms. Marcelli presents today for follow-up visit of her suspected asthma, and cough. Since her last visit she's had a methacholine challenge test, 6 minute walk test, pulmonary function tests for which she would like to discuss today. Today she endorses that her allergies are acting up and she does have a mild cough, and "tickle" in her throat. Patient is currently using Zyrtec daily. Plan: -Cont with CPAP -Patient with subcentimeter nodules. -Stable nodularity in size and characteristic over the past one year,Will plan for followup CAT scan in 03/2015 -Asmanex 1 puff twice a day, during high allergy season, otherwise will plan for as needed bronchodilator otherwise. -Continue with allergy control with Zyrtec  Events since last clinic visit: Patient resents today for a follow-up visit of asthma and cough. 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 Today patient states that Asmanex does help with some SOB, but after a week of use it was causing paradoxical wheezing.  Once she stopped Asmanex, then the wheezing stopped.   Medication:   Current Outpatient Rx  Name  Route  Sig  Dispense  Refill  . acetaminophen (TYLENOL) 500 MG tablet   Oral   Take 500 mg by mouth every 6 (six) hours as needed.         Marland Kitchen albuterol (PROVENTIL HFA;VENTOLIN HFA) 108 (90 BASE) MCG/ACT inhaler   Inhalation   Inhale 2 puffs into the lungs every 4 (four) hours as needed.         Marland Kitchen aspirin EC 81 MG tablet   Oral   Take 1 tablet by mouth daily.         . Calcium-Vitamin D 600-200 MG-UNIT per tablet   Oral   Take 1 tablet by mouth 2 (two) times daily.         . carvedilol (COREG) 6.25 MG tablet   Oral   Take 1 tablet by mouth daily.      0   . Cetirizine HCl 10 MG CAPS   Oral   Take 1 tablet by mouth daily.         Marland Kitchen estradiol (ESTRACE) 0.1 MG/GM vaginal cream   Vaginal   Place 2 g vaginally 2 (two) times a week.         . Mometasone Furoate 110 MCG/INH AEPB   Inhalation   Inhale 1 puff into the lungs daily.   1 Inhaler  3   . triamcinolone ointment (KENALOG) 0.1 %   Topical   Apply 1 application topically 2 (two) times daily.         Marland Kitchen triamterene-hydrochlorothiazide (MAXZIDE-25) 37.5-25 MG per tablet   Oral   Take 1 tablet by mouth daily.      0   . UNABLE TO FIND      Med Name:Asidis (Vitamin supplement for eyes) Take BID            Review of Systems  Constitutional: Negative for fever and chills.  Eyes: Negative for blurred vision and double vision.  Respiratory: Negative for cough, hemoptysis, sputum production, shortness of breath and wheezing.   Cardiovascular: Negative for chest pain and palpitations.  Gastrointestinal: Negative for heartburn and nausea.  Skin: Negative for  itching and rash.  Neurological: Negative for headaches.      Allergies:  Celecoxib; Hydrocodone-acetaminophen; Meloxicam; Morphine sulfate er beads; Naproxen; Other; Rofecoxib; Ciprofloxacin-hydrocortisone; Nickel; and Tramadol-acetaminophen  Physical Examination:  VS: BP 138/84 mmHg  Pulse 64  Wt 301 lb (136.533 kg)  SpO2 100%  General Appearance: No distress  HEENT: PERRLA, no ptosis, no other lesions noticed Pulmonary:normal breath sounds., diaphragmatic excursion normal.No wheezing, No rales   Cardiovascular:  Normal S1,S2.  No m/r/g.     Abdomen:Exam: Benign, Soft, non-tender, No masses  Skin:   warm, no rashes, no ecchymosis  Extremities: normal, no cyanosis, clubbing, warm with normal capillary refill.      Assessment and Plan: Extrinsic asthma Patient with positive methacholine challenge testing, currently with no obstruction of a pulmonary function test. She does have some mild cough and postnasal drip secondary to allergies for which she is on Zyrtec. Her ENT had previously giving her non-powder inhalers, Asmanex, for which she will start using given home confirmed diagnosis of asthma with a positive methacholine challenge testing. Asmanex is causing some paradoxical wheezing, and this time we recommended using Asmanex mainly during allergy season if she can tolerate it otherwise, patient states she has some samples at home of possibly Symbicort and she will try that.  Plan: -Continue with Asmanex as tolerated during allergy season, otherwise patient advised to try sample of Symbicort which she already has. -Continue allergy control, continue with allergy regiment.         Updated Medication List Outpatient Encounter Prescriptions as of 11/20/2014  Medication Sig  . acetaminophen (TYLENOL) 500 MG tablet Take 500 mg by mouth every 6 (six) hours as needed.  Marland Kitchen albuterol (PROVENTIL HFA;VENTOLIN HFA) 108 (90 BASE) MCG/ACT inhaler Inhale 2 puffs into the lungs every  4 (four) hours as needed.  Marland Kitchen aspirin EC 81 MG tablet Take 1 tablet by mouth daily.  . Calcium-Vitamin D 600-200 MG-UNIT per tablet Take 1 tablet by mouth 2 (two) times daily.  . carvedilol (COREG) 6.25 MG tablet Take 1 tablet by mouth daily.  . Cetirizine HCl 10 MG CAPS Take 1 tablet by mouth daily.  Marland Kitchen estradiol (ESTRACE) 0.1 MG/GM vaginal cream Place 2 g vaginally 2 (two) times a week.  . triamcinolone ointment (KENALOG) 0.1 % Apply 1 application topically 2 (two) times daily.  Marland Kitchen triamterene-hydrochlorothiazide (MAXZIDE-25) 37.5-25 MG per tablet Take 1 tablet by mouth daily.  Marland Kitchen UNABLE TO FIND Med Name:Asidis (Vitamin supplement for eyes) Take BID  . [DISCONTINUED] Mometasone Furoate 110 MCG/INH AEPB Inhale 1 puff into the lungs daily.   No facility-administered encounter medications on file as of 11/20/2014.    Orders for this visit: No orders of the  defined types were placed in this encounter.    Thank  you for the visitation and for allowing  [facility] to assist in the care of your patient. Our recommendations are noted above.  Please contact us if we can be of further service.  Vilinda Boehringer, MD [facility] Office Number: [phone]

## 2014-11-20 NOTE — Patient Instructions (Signed)
Follow up with Dr. Stevenson Clinch in: 6 months - cont with as needed albuterol - Cont with Asmanex during allergy season, if you continue to have wheezing, then stop Asmanex and try your sample Symbicort (1 puff per day, during allergy season, gargle and rinse after each use).  - cont with allergy regiment.

## 2014-11-20 NOTE — Assessment & Plan Note (Signed)
Patient with positive methacholine challenge testing, currently with no obstruction of a pulmonary function test. She does have some mild cough and postnasal drip secondary to allergies for which she is on Zyrtec. Her ENT had previously giving her non-powder inhalers, Asmanex, for which she will start using given home confirmed diagnosis of asthma with a positive methacholine challenge testing. Asmanex is causing some paradoxical wheezing, and this time we recommended using Asmanex mainly during allergy season if she can tolerate it otherwise, patient states she has some samples at home of possibly Symbicort and she will try that.  Plan: -Continue with Asmanex as tolerated during allergy season, otherwise patient advised to try sample of Symbicort which she already has. -Continue allergy control, continue with allergy regiment.

## 2015-03-12 ENCOUNTER — Other Ambulatory Visit: Payer: Self-pay | Admitting: Family Medicine

## 2015-03-12 DIAGNOSIS — Z1231 Encounter for screening mammogram for malignant neoplasm of breast: Secondary | ICD-10-CM

## 2015-03-31 ENCOUNTER — Other Ambulatory Visit: Payer: Self-pay | Admitting: Family Medicine

## 2015-03-31 ENCOUNTER — Ambulatory Visit
Admission: RE | Admit: 2015-03-31 | Discharge: 2015-03-31 | Disposition: A | Discharge status: Home / Self Care | Payer: Medicare Other | Source: Ambulatory Visit | Attending: Family Medicine | Admitting: Family Medicine

## 2015-03-31 DIAGNOSIS — Z1231 Encounter for screening mammogram for malignant neoplasm of breast: Secondary | ICD-10-CM | POA: Diagnosis present

## 2015-05-25 ENCOUNTER — Encounter: Payer: Self-pay | Admitting: Internal Medicine

## 2015-05-25 ENCOUNTER — Ambulatory Visit (INDEPENDENT_AMBULATORY_CARE_PROVIDER_SITE_OTHER): Payer: Medicare Other | Admitting: Internal Medicine

## 2015-05-25 VITALS — BP 136/84 | HR 80 | Ht 68.0 in | Wt 306.8 lb

## 2015-05-25 DIAGNOSIS — R918 Other nonspecific abnormal finding of lung field: Secondary | ICD-10-CM | POA: Diagnosis not present

## 2015-05-25 DIAGNOSIS — J4520 Mild intermittent asthma, uncomplicated: Secondary | ICD-10-CM | POA: Diagnosis not present

## 2015-05-25 MED ORDER — BUDESONIDE-FORMOTEROL FUMARATE 80-4.5 MCG/ACT IN AERO: 2.0000 | INHALATION_SPRAY | Freq: Two times a day (BID) | RESPIRATORY_TRACT | Status: DC

## 2015-05-25 MED ORDER — ALBUTEROL SULFATE HFA 108 (90 BASE) MCG/ACT IN AERS: 2.0000 | INHALATION_SPRAY | RESPIRATORY_TRACT | Status: DC | PRN

## 2015-05-25 NOTE — Progress Notes (Signed)
[facility] Consultation      MRN# [id number] Jasmine Morrow 11-14-1945   CC: Chief Complaint  Patient presents with  . Follow-up    pt states breathing is baseline. DOE, prod cough clear in color due to sinus, chest heaviness. feels asmanex didn't help.      Brief History: Synopsis: 70 year old female with clinical diagnosis of asthma, normal spirometry, seen in pulmonary Jasmine Morrow February 2016 for establish care. History of recent mild intermittent asthma, steroid responsive, however, with inhaled steroids gets thrush easily. Positive M[facility] test, normal PFTs. Started on Asmanex.  Clinicaly with Asthma, mostly during allergy season.   Brief HPI 03/31/14:She is a pleasant 70 year old female presenting today for establish care visit of a current diagnosis of asthma. History per patient and review of records. 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 Hx of seasonal allergies currently on Zyrtec. Develops thrush with different inhalers. Currently on intermittent Asmanex  ROV 05/13/14 Jasmine Morrow presents today for follow-up visit of her suspected asthma, and cough. Since her last visit she's had a methacholine challenge test, 6 minute walk test, pulmonary function tests for which she would like to discuss today. Today she endorses that her allergies are acting up and she does have a mild cough, and "tickle" in her throat. Patient is currently using Zyrtec daily. Plan: -Cont with CPAP -Patient with subcentimeter nodules. -Stable nodularity in size and characteristic over the past one year,Will plan for followup CAT scan in 03/2015 -Asmanex 1 puff twice a day, during high allergy season, otherwise will plan for as needed bronchodilator otherwise. -Continue with allergy control with Zyrtec  Events since last clinic visit: Patient resents today for a follow-up visit of  asthma and cough. Tried using Asmanex during allergy season, but it was not helpful. Today she also admits to nasal drainage especially during high allergy times. She has intermittent albuterol use, maybe 1-2 times per month, depending on the weather. She does have a mild cough in the mornings which she attributes to postnasal drip. She has tried Symbicort, has not used in a long while, cannot remember how long ago she last use. Is willing to start back    Medication:   Current Outpatient Rx  Name  Route  Sig  Dispense  Refill  . acetaminophen (TYLENOL) 500 MG tablet   Oral   Take 500 mg by mouth every 6 (six) hours as needed.         Marland Kitchen albuterol (PROVENTIL HFA;VENTOLIN HFA) 108 (90 BASE) MCG/ACT inhaler   Inhalation   Inhale 2 puffs into the lungs every 4 (four) hours as needed.         Marland Kitchen aspirin EC 81 MG tablet   Oral   Take 1 tablet by mouth daily.         . Calcium-Vitamin D 600-200 MG-UNIT per tablet   Oral   Take 1 tablet by mouth 2 (two) times daily.         . carvedilol (COREG) 6.25 MG tablet   Oral   Take 1 tablet by mouth daily.      0   . Cetirizine HCl 10 MG CAPS   Oral   Take 1 tablet by mouth daily.         Marland Kitchen estradiol (ESTRACE) 0.1 MG/GM vaginal cream   Vaginal   Place 2 g vaginally 2 (two) times a week.         Marland Kitchen  triamcinolone ointment (KENALOG) 0.1 %   Topical   Apply 1 application topically 2 (two) times daily.         Marland Kitchen triamterene-hydrochlorothiazide (MAXZIDE-25) 37.5-25 MG per tablet   Oral   Take 1 tablet by mouth daily.      0   . UNABLE TO FIND      Med Name:Asidis (Vitamin supplement for eyes) Take BID            Review of Systems  Constitutional: Negative for fever and chills.  Eyes: Negative for blurred vision and double vision.  Respiratory: Negative for cough, hemoptysis, sputum production, shortness of breath and wheezing.   Cardiovascular: Negative for chest pain and palpitations.  Gastrointestinal: Negative  for heartburn and nausea.  Skin: Negative for itching and rash.  Neurological: Negative for headaches.      Allergies:  Celecoxib; Hydrocodone-acetaminophen; Meloxicam; Morphine sulfate er beads; Naproxen; Other; Rofecoxib; Ciprofloxacin-hydrocortisone; Nickel; and Tramadol-acetaminophen  Physical Examination:  VS: BP 136/84 mmHg  Pulse 80  Ht 5\' 8"  (1.727 m)  Wt 306 lb 12.8 oz (139.164 kg)  BMI 46.66 kg/m2  SpO2 98%  General Appearance: No distress  HEENT: PERRLA, no ptosis, no other lesions noticed Pulmonary:normal breath sounds., diaphragmatic excursion normal.No wheezing, No rales   Cardiovascular:  Normal S1,S2.  No m/r/g.     Abdomen:Exam: Benign, Soft, non-tender, No masses  Skin:   warm, no rashes, no ecchymosis  Extremities: normal, no cyanosis, clubbing, warm with normal capillary refill.      Assessment and Plan: Multiple lung nodules Patient with subcentimeter nodules. Stable nodularity in size and characteristic over the past one year,Will plan for followup CAT scan in 3 months      Extrinsic asthma Patient with positive methacholine challenge testing, currently with no obstruction on pulmonary function test. She does have some mild cough and postnasal drip secondary to allergies for which she is on Zyrtec. Her ENT had previously giving her non-powder inhalers, Asmanex, for which she will start using given home confirmed diagnosis of asthma with a positive methacholine challenge testing. Asmanex is causing some paradoxical wheezing, we recommended using Asmanex mainly during allergy season which did not provide much benefit.  At today's visit we restarted Symbicort 36mcg.   Plan: -Patient advised to try sample of Symbicort, if no improvement, will try Arnuity, otherwise continue with supportive care and allergy control (She has been doing this all along, without ED/Urgent care visit). -Continue allergy control, continue with allergy  regiment.           Updated Medication List Outpatient Encounter Prescriptions as of 05/25/2015  Medication Sig  . acetaminophen (TYLENOL) 500 MG tablet Take 500 mg by mouth every 6 (six) hours as needed.  Marland Kitchen albuterol (PROVENTIL HFA;VENTOLIN HFA) 108 (90 BASE) MCG/ACT inhaler Inhale 2 puffs into the lungs every 4 (four) hours as needed.  Marland Kitchen aspirin EC 81 MG tablet Take 1 tablet by mouth daily.  . Calcium-Vitamin D 600-200 MG-UNIT per tablet Take 1 tablet by mouth 2 (two) times daily.  . carvedilol (COREG) 6.25 MG tablet Take 1 tablet by mouth daily.  . Cetirizine HCl 10 MG CAPS Take 1 tablet by mouth daily.  Marland Kitchen estradiol (ESTRACE) 0.1 MG/GM vaginal cream Place 2 g vaginally 2 (two) times a week.  . triamcinolone ointment (KENALOG) 0.1 % Apply 1 application topically 2 (two) times daily.  Marland Kitchen triamterene-hydrochlorothiazide (MAXZIDE-25) 37.5-25 MG per tablet Take 1 tablet by mouth daily.  Marland Kitchen UNABLE TO FIND Med Name:Asidis (Vitamin  supplement for eyes) Take BID   No facility-administered encounter medications on file as of 05/25/2015.    Orders for this visit: No orders of the defined types were placed in this encounter.    Thank  you for the visitation and for allowing  [facility] to assist in the care of your patient. Our recommendations are noted above.  Please contact us if we can be of further service.  Vilinda Boehringer, MD [facility] Office Number: [phone]

## 2015-05-25 NOTE — Patient Instructions (Addendum)
Follow-up (Dr. Stevenson Clinch in 3 months -Continue with allergy regiment -restart symbicort 5mcg, 1 puff bid - gargle and rinse after each.

## 2015-05-27 ENCOUNTER — Telehealth: Payer: Self-pay

## 2015-05-27 NOTE — Assessment & Plan Note (Signed)
Patient with subcentimeter nodules. Stable nodularity in size and characteristic over the past one year,Will plan for followup CAT scan in 3 months

## 2015-05-27 NOTE — Telephone Encounter (Signed)
Pt had OV with VM on 05/25/15 was given a sample of Symbicort. Pt states since starting Symbicort her heart is racing & it's keeping her awake @ night. Spoke with VM pt advised to stop Symbicort and try a sample of arnuity & call us in the next week if she's benefiting from it we will call in RX. Pt informed of sample left up front. Pt voiced understanding. Nothing further needed.

## 2015-05-27 NOTE — Assessment & Plan Note (Signed)
Patient with positive methacholine challenge testing, currently with no obstruction on pulmonary function test. She does have some mild cough and postnasal drip secondary to allergies for which she is on Zyrtec. Her ENT had previously giving her non-powder inhalers, Asmanex, for which she will start using given home confirmed diagnosis of asthma with a positive methacholine challenge testing. Asmanex is causing some paradoxical wheezing, we recommended using Asmanex mainly during allergy season which did not provide much benefit.  At today's visit we restarted Symbicort 51mcg.   Plan: -Patient advised to try sample of Symbicort, if no improvement, will try Arnuity, otherwise continue with supportive care and allergy control (She has been doing this all along, without ED/Urgent care visit). -Continue allergy control, continue with allergy regiment.

## 2015-08-17 ENCOUNTER — Telehealth: Payer: Self-pay | Admitting: *Deleted

## 2015-08-17 ENCOUNTER — Ambulatory Visit
Admission: RE | Admit: 2015-08-17 | Discharge: 2015-08-17 | Disposition: A | Discharge status: Home / Self Care | Payer: Medicare Other | Source: Ambulatory Visit | Attending: Internal Medicine | Admitting: Internal Medicine

## 2015-08-17 DIAGNOSIS — R918 Other nonspecific abnormal finding of lung field: Secondary | ICD-10-CM | POA: Insufficient documentation

## 2015-08-17 DIAGNOSIS — I700 Atherosclerosis of aorta: Secondary | ICD-10-CM | POA: Diagnosis not present

## 2015-08-17 MED ORDER — IOPAMIDOL (ISOVUE-300) INJECTION 61%
75.0000 mL | Freq: Once | INTRAVENOUS | Status: AC | PRN
  Administered 2015-08-17: 75 mL via INTRAVENOUS

## 2015-08-17 NOTE — Telephone Encounter (Signed)
-----   Message from Vilinda Boehringer, MD sent at 08/17/2015 12:21 PM EDT ----- Regarding: CT Results Reason for patient of her CT scan results. Stable nodules, no new findings, benign chest CT. Further details can be discussed at follow-up visit   Thank you

## 2015-08-17 NOTE — Telephone Encounter (Signed)
Pt informed. Nothing further needed. 

## 2015-08-24 ENCOUNTER — Ambulatory Visit (INDEPENDENT_AMBULATORY_CARE_PROVIDER_SITE_OTHER): Payer: Medicare Other | Admitting: Internal Medicine

## 2015-08-24 ENCOUNTER — Encounter: Payer: Self-pay | Admitting: Internal Medicine

## 2015-08-24 VITALS — BP 136/74 | HR 85 | Ht 68.0 in | Wt 308.0 lb

## 2015-08-24 DIAGNOSIS — R918 Other nonspecific abnormal finding of lung field: Secondary | ICD-10-CM | POA: Diagnosis not present

## 2015-08-24 DIAGNOSIS — J4520 Mild intermittent asthma, uncomplicated: Secondary | ICD-10-CM

## 2015-08-24 DIAGNOSIS — G4733 Obstructive sleep apnea (adult) (pediatric): Secondary | ICD-10-CM

## 2015-08-24 DIAGNOSIS — Z9989 Dependence on other enabling machines and devices: Secondary | ICD-10-CM

## 2015-08-24 NOTE — Assessment & Plan Note (Signed)
Encouraged proper weight management.  Excessive weight may contribute to snoring.  Monitor sedative use.  Discussed driving precautions and its relationship with hypersomnolence.  Discussed operating dangerous equipment and its relationship with hypersomnolence.  Discussed sleep hygiene, and benefits of a fixed sleep waked time.  The importance of getting eight or more hours of sleep discussed with patient.  Discussed limiting the use of the computer and television before bedtime.  Decrease naps during the day, so night time sleep will become enhanced.  Limit caffeine, and sleep deprivation.  HTN, stroke, and heart failure are potential risk factors.  Encouraged continued use of CPAP machine 7 nights per week 6-8 hours per night.     

## 2015-08-24 NOTE — Progress Notes (Signed)
[facility] Consultation      MRN# [id number] Jasmine Morrow 06-Jul-1945   CC: Chief Complaint  Patient presents with  . Follow-up    breathing is baseline since last OV. sob w/exertion & occ prod cough w/clear mucus mainly in the morning.      Brief History: Synopsis: 70 year old female with clinical diagnosis of asthma, normal spirometry, seen in pulmonary [place] February 2016 for establish care. History of recent mild intermittent asthma, steroid responsive, however, with inhaled steroids gets thrush easily. Positive MCH test, normal PFTs. Started on Asmanex.  Clinicaly with Asthma, mostly during allergy season.   Brief HPI 03/31/14:She is a pleasant 70 year old female presenting today for establish care visit of a current diagnosis of asthma. History per patient and review of records. 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 Hx of seasonal allergies currently on Zyrtec. Develops thrush with different inhalers. Currently on intermittent Asmanex  ROV 05/13/14 Ms. Birdsong presents today for follow-up visit of her suspected asthma, and cough. Since her last visit she's had a methacholine challenge test, 6 minute walk test, pulmonary function tests for which she would like to discuss today. Today she endorses that her allergies are acting up and she does have a mild cough, and "tickle" in her throat. Patient is currently using Zyrtec daily. Plan: -Cont with CPAP -Patient with subcentimeter nodules. -Stable nodularity in size and characteristic over the past one year,Will plan for followup CAT scan in 03/2015 -Asmanex 1 puff twice a day, during high allergy season, otherwise will plan for as needed bronchodilator otherwise. -Continue with allergy control with Zyrtec  Events since last clinic visit: Patient resents today for a follow-up visit of asthma and  cough. Today states that she has a mild intermittent cough, mostly non-productive. Does have some morning sinus drainage, which resolves as the day progress. May use albuterol inhaler once or twice in 14 days. Only has dyspnea with exertion.  Overall with stable respiratory status.  CT Chest results also discussed today.     Medication:   Current Outpatient Rx  . Order #: [id number] Class: Historical Med  . Order #: [id number] Class: Normal  . Order #: [id number] Class: Historical Med  . Order #: [id number] Class: Historical Med  . Order #: [id number] Class: Historical Med  . Order #: [id number] Class: Historical Med  . Order #: [id number] Class: Historical Med  . Order #: [id number] Class: Historical Med  . Order #: [id number] Class: Historical Med  . Order #: [id number] Class: Historical Med  . Order #: [id number] Class: Historical Med     Review of Systems  Constitutional: Negative for chills and fever.  Eyes: Negative for blurred vision and double vision.  Respiratory: Negative for cough, hemoptysis, sputum production, shortness of breath and wheezing.   Cardiovascular: Negative for chest pain and palpitations.  Gastrointestinal: Negative for heartburn and nausea.  Skin: Negative for itching and rash.  Neurological: Negative for headaches.      Allergies:  Celecoxib; Hydrocodone-acetaminophen; Meloxicam; Morphine sulfate er beads; Naproxen; Other; Rofecoxib; Ciprofloxacin-hydrocortisone; Nickel; and Tramadol-acetaminophen  Physical Examination:  VS: BP 136/74 (BP Location: Left Wrist, Cuff Size: Normal)   Pulse 85   Ht 5\' 8"  (1.727 m)   Wt (!) 308 lb (139.7 kg)   SpO2 96%   BMI 46.83 kg/m   General Appearance: No distress  HEENT: PERRLA, no ptosis, no other lesions noticed Pulmonary:normal breath sounds., diaphragmatic excursion normal.No wheezing, No rales  Cardiovascular:  Normal S1,S2.  No m/r/g.     Abdomen:Exam: Benign, Soft, non-tender, No masses  Skin:   warm, no rashes, no  ecchymosis  Extremities: normal, no cyanosis, clubbing, warm with normal capillary refill.    (The following images and results were reviewed by Dr. Stevenson Clinch on 08/24/2015).  CT Chest 08/17/15 CT CHEST WITH CONTRAST  TECHNIQUE: Multidetector CT imaging of the chest was performed during intravenous contrast administration.  CONTRAST:  2mL ISOVUE-300 IOPAMIDOL (ISOVUE-300) INJECTION 61%  COMPARISON:  03/18/2014  FINDINGS: Cardiovascular: The heart is normal in size. No pericardial effusion. The aorta is normal in caliber. No dissection. Mild tortuosity and scattered atherosclerotic calcifications. The branch vessels are patent. Stable scattered coronary artery calcifications.  Mediastinum/Nodes: No breast masses, supraclavicular or axillary lymphadenopathy. There is a stable low-attenuation lesion in the isthmic region of the thyroid gland consistent with a benign cyst.  The heart is normal in size. No pericardial effusion. The aorta is normal in caliber. No dissection. Scattered coronary artery calcifications.  No mediastinal or hilar mass or adenopathy. The esophagus is grossly normal  Lungs/Pleura: Stable small bilateral pulmonary nodules since 2015. No new nodules or acute pulmonary findings. Stable linear scarring changes in the left upper lobe. No pleural effusion.  Upper Abdomen: No significant upper abdominal findings.  Musculoskeletal: No significant bony findings.  IMPRESSION: 1. Stable small bilateral pulmonary nodules since 2015. These are considered benign. No further imaging evaluation or follow-up is necessary. 2. No acute pulmonary findings. Stable scarring changes in the left upper lobe and right lower lobe. 3. No mediastinal or hilar mass or adenopathy. Stable atherosclerotic calcifications involving the aorta and coronary arteries.  Assessment and Plan:70 yo with mild-intermittent asthma for follow up, along with subcentimeter nodules.    Extrinsic asthma Patient with positive methacholine challenge testing, currently with no obstruction on pulmonary function test. She does have some mild cough and postnasal drip secondary to allergies for which she is on Zyrtec. Has tried inhalers in the past, with no significant improvement, most recently Symbicort (however, has tried inhalers hoping for immediate relief, and has not used them consistently). Does not react well to powder inhalers (has chest discomfort).  Given her mild symptoms of asthma, will treat with SABA only.  Majority of her symptoms are usually due to post nasal drip.   Plan: - albuterol inhaler - 2puff every 3-4 hours as needed for shortness of breath\wheezing\recurrent cough - Continue allergy control, continue with allergy regiment.         Multiple lung nodules  CT Chest July 2017 - Stable left upper lobe nodule about 4 mm - no new nodules.  - no need for further imaging  OSA on CPAP Encouraged proper weight management.  Excessive weight may contribute to snoring.  Monitor sedative use.  Discussed driving precautions and its relationship with hypersomnolence.  Discussed operating dangerous equipment and its relationship with hypersomnolence.  Discussed sleep hygiene, and benefits of a fixed sleep waked time.  The importance of getting eight or more hours of sleep discussed with patient.  Discussed limiting the use of the computer and television before bedtime.  Decrease naps during the day, so night time sleep will become enhanced.  Limit caffeine, and sleep deprivation.  HTN, stroke, and heart failure are potential risk factors.  Encouraged continued use of CPAP machine 7 nights per week 6-8 hours per night.       Updated Medication List Outpatient Encounter Prescriptions as of 08/24/2015  Medication Sig  .  acetaminophen (TYLENOL) 500 MG tablet Take 500 mg by mouth every 6 (six) hours as needed.  Marland Kitchen albuterol (PROVENTIL HFA;VENTOLIN HFA)  108 (90 Base) MCG/ACT inhaler Inhale 2 puffs into the lungs every 4 (four) hours as needed.  Marland Kitchen aspirin EC 81 MG tablet Take 1 tablet by mouth daily.  . Calcium-Vitamin D 600-200 MG-UNIT per tablet Take 1 tablet by mouth 2 (two) times daily.  . carvedilol (COREG) 6.25 MG tablet Take 1 tablet by mouth daily.  . Cetirizine HCl 10 MG CAPS Take 1 tablet by mouth daily.  Marland Kitchen estradiol (ESTRACE) 0.1 MG/GM vaginal cream Place 2 g vaginally 2 (two) times a week.  Marland Kitchen omeprazole (PRILOSEC) 20 MG capsule TAKE ONE CAPSULE BY MOUTH DAILY  . triamcinolone ointment (KENALOG) 0.1 % Apply 1 application topically 2 (two) times daily.  Marland Kitchen triamterene-hydrochlorothiazide (MAXZIDE-25) 37.5-25 MG per tablet Take 1 tablet by mouth daily.  Marland Kitchen UNABLE TO FIND Med Name:Asidis (Vitamin supplement for eyes) Take BID  . [DISCONTINUED] budesonide-formoterol (SYMBICORT) 80-4.5 MCG/ACT inhaler Inhale 2 puffs into the lungs 2 (two) times daily.   No facility-administered encounter medications on file as of 08/24/2015.     Orders for this visit: No orders of the defined types were placed in this encounter.   Thank  you for the visitation and for allowing  [facility] to assist in the care of your patient. Our recommendations are noted above.  Please contact us if we can be of further service.  Vilinda Boehringer, MD [facility] Office Number: [phone]

## 2015-08-24 NOTE — Patient Instructions (Signed)
Follow up with Dr. Stevenson Clinch in:6 months - cont with allergy control  - may use sinus rinse, such as netti pot, 2-3 time per week (follow directions on packaging) - avoid allergens as much as possible.

## 2015-08-24 NOTE — Assessment & Plan Note (Addendum)
Patient with positive methacholine challenge testing, currently with no obstruction on pulmonary function test. She does have some mild cough and postnasal drip secondary to allergies for which she is on Zyrtec. Has tried inhalers in the past, with no significant improvement, most recently Symbicort (however, has tried inhalers hoping for immediate relief, and has not used them consistently). Does not react well to powder inhalers (has chest discomfort).  Given her mild symptoms of asthma, will treat with SABA only.  Majority of her symptoms are usually due to post nasal drip.   Plan: - albuterol inhaler - 2puff every 3-4 hours as needed for shortness of breath\wheezing\recurrent cough - Continue allergy control, continue with allergy regiment.

## 2015-08-24 NOTE — Assessment & Plan Note (Signed)
  CT Chest July 2017 - Stable left upper lobe nodule about 4 mm - no new nodules.  - no need for further imaging

## 2016-03-18 ENCOUNTER — Other Ambulatory Visit: Payer: Self-pay | Admitting: Family Medicine

## 2016-03-18 DIAGNOSIS — Z1231 Encounter for screening mammogram for malignant neoplasm of breast: Secondary | ICD-10-CM

## 2016-04-26 ENCOUNTER — Ambulatory Visit
Admission: RE | Admit: 2016-04-26 | Discharge: 2016-04-26 | Disposition: A | Discharge status: Home / Self Care | Payer: Medicare Other | Source: Ambulatory Visit | Attending: Family Medicine | Admitting: Family Medicine

## 2016-04-26 DIAGNOSIS — Z1231 Encounter for screening mammogram for malignant neoplasm of breast: Secondary | ICD-10-CM

## 2016-05-09 ENCOUNTER — Ambulatory Visit (INDEPENDENT_AMBULATORY_CARE_PROVIDER_SITE_OTHER): Payer: Medicare Other | Admitting: Pulmonary Disease

## 2016-05-09 ENCOUNTER — Encounter: Payer: Self-pay | Admitting: Pulmonary Disease

## 2016-05-09 VITALS — BP 134/86 | HR 97 | Wt 312.0 lb

## 2016-05-09 DIAGNOSIS — J4530 Mild persistent asthma, uncomplicated: Secondary | ICD-10-CM

## 2016-05-09 DIAGNOSIS — J301 Allergic rhinitis due to pollen: Secondary | ICD-10-CM

## 2016-05-09 MED ORDER — MONTELUKAST SODIUM 10 MG PO TABS: 10.0000 mg | ORAL_TABLET | Freq: Every day | ORAL | 10 refills | Status: DC

## 2016-05-09 MED ORDER — ALBUTEROL SULFATE HFA 108 (90 BASE) MCG/ACT IN AERS: 2.0000 | INHALATION_SPRAY | RESPIRATORY_TRACT | 3 refills | Status: DC | PRN

## 2016-05-09 NOTE — Patient Instructions (Addendum)
Singulair (montelukast) 10 mg daily - take at bedtime. You amy try on and off to discern whether it is helping your asthma and/or allergy symptoms.  Continue Zyrtec each morning Continue albuterol as needed (refilled) Follow up in 6 months or as needed

## 2016-05-09 NOTE — Progress Notes (Signed)
PULMONARY OFFICE FOLLOW UP NOTE  PROBLEMS:  Mild intermittent asthma with seasonal component Chronic seasonal allergic rhinitis  DATA: 05/13/14 PFTs:  Entirely normal 08/17/15 CT chest: Stable, very small bilateral pulmonary nodules. Lingular infiltrate with mild bronchiectatic changes  INTERVAL HISTORY: Previously followed by Dr. Stevenson Clinch. Last seen July 2017. No major events  SUBJ: Overall has been extremely stable since last seen by Dr. Stevenson Clinch in July. However, with the recent increase in pollens, she notes increased nasal congestion and posterior nasal drainage. She has not been able to tolerate inhaled steroids due to recurrent thrush. She has an albuterol rescue inhaler which she uses extremely rarely. Denies CP, fever, purulent sputum, hemoptysis, LE edema and calf tenderness.  OBJ: Vitals:   05/09/16 1610  BP: 134/86  Pulse: 97  SpO2: 96%  Weight: (!) 141.5 kg (312 lb)    Gen: Obese in NAD HEENT: All WNL Neck: NO LAN, no JVD noted Lungs: full BS, normal percussion note, no wheezes Cardiovascular: Reg rate, normal rhythm, no M noted Abdomen: Soft, NT +BS Ext: no C/C/E Neuro: CNs intact, motor/sens grossly intact Skin: No lesions noted   DATA: No new labs or chest x-ray  IMPRESSION: Mild persistent asthma without complication  Chronic seasonal allergic rhinitis due to pollen    PLAN: Trial of Singulair (montelukast) 10 mg daily at bedtime Continue Zyrtec each morning Continue albuterol as needed (refilled) Follow up in 6 months or as needed   Merton Border, MD [facility] service Mobile [phone] Pager [phone] 05/09/2016

## 2016-05-19 ENCOUNTER — Telehealth: Payer: Self-pay | Admitting: Pulmonary Disease

## 2016-05-19 NOTE — Telephone Encounter (Signed)
Pt states a few days ago she started having cracking and wheezing in her chest. Please call.

## 2016-05-20 NOTE — Telephone Encounter (Signed)
4 days ago pt started with wheezing, crackling and prod cough.  States when she seen you last week she was ok but now it has changed. Pt has used her Albuterol and Singulair. Pt is asking if you have any suggest for an inhaler, non powder and if she needs Prednisone and abx. Please advise.

## 2016-05-25 MED ORDER — BECLOMETHASONE DIPROPIONATE 40 MCG/ACT IN AERS: 2.0000 | INHALATION_SPRAY | Freq: Two times a day (BID) | RESPIRATORY_TRACT | 12 refills | Status: DC

## 2016-05-25 NOTE — Telephone Encounter (Signed)
If she still feels that she needs something, we can start an inhaled steroid but she has had problems with thrush in the past. I have placed ordered for an order for low dose Qvar inhaler if she wants to try that. Rinse mouth thoroughly after use  [name]

## 2016-05-26 NOTE — Telephone Encounter (Signed)
Pt states she went to her PCP and got abx and Prednisone. She ask that we cancel the Qvar that was sent in. pharmacy called. Nothing further needed.

## 2016-06-13 ENCOUNTER — Ambulatory Visit: Payer: Medicare Other | Admitting: Pulmonary Disease

## 2017-02-23 ENCOUNTER — Telehealth: Payer: Self-pay | Admitting: Pulmonary Disease

## 2017-02-23 NOTE — Telephone Encounter (Signed)
Attempted to schedule fu Dr. Alva Garnet   Patient declined has found a new provider

## 2017-04-07 ENCOUNTER — Other Ambulatory Visit: Payer: Self-pay | Admitting: Family Medicine

## 2017-04-07 DIAGNOSIS — Z1231 Encounter for screening mammogram for malignant neoplasm of breast: Secondary | ICD-10-CM

## 2017-05-02 ENCOUNTER — Ambulatory Visit
Admission: RE | Admit: 2017-05-02 | Discharge: 2017-05-02 | Disposition: A | Discharge status: Home / Self Care | Payer: Medicare Other | Source: Ambulatory Visit | Attending: Family Medicine | Admitting: Family Medicine

## 2017-05-02 DIAGNOSIS — Z1231 Encounter for screening mammogram for malignant neoplasm of breast: Secondary | ICD-10-CM | POA: Diagnosis present

## 2018-06-20 ENCOUNTER — Other Ambulatory Visit: Payer: Self-pay | Admitting: Family Medicine

## 2018-06-20 DIAGNOSIS — Z1231 Encounter for screening mammogram for malignant neoplasm of breast: Secondary | ICD-10-CM

## 2018-07-10 ENCOUNTER — Other Ambulatory Visit: Payer: Self-pay

## 2018-07-10 ENCOUNTER — Ambulatory Visit
Admission: RE | Admit: 2018-07-10 | Discharge: 2018-07-10 | Disposition: A | Discharge status: Home / Self Care | Payer: Medicare Other | Source: Ambulatory Visit | Attending: Family Medicine | Admitting: Family Medicine

## 2018-07-10 DIAGNOSIS — Z1231 Encounter for screening mammogram for malignant neoplasm of breast: Secondary | ICD-10-CM | POA: Insufficient documentation

## 2019-02-06 ENCOUNTER — Emergency Department: Payer: Medicare Other

## 2019-02-06 ENCOUNTER — Other Ambulatory Visit: Payer: Self-pay

## 2019-02-06 ENCOUNTER — Emergency Department
Admission: EM | Admit: 2019-02-06 | Discharge: 2019-02-06 | Disposition: A | Discharge status: Home / Self Care | Payer: Medicare Other | Source: Home / Self Care | Attending: Emergency Medicine | Admitting: Emergency Medicine

## 2019-02-06 DIAGNOSIS — Z79899 Other long term (current) drug therapy: Secondary | ICD-10-CM | POA: Insufficient documentation

## 2019-02-06 DIAGNOSIS — S82851A Displaced trimalleolar fracture of right lower leg, initial encounter for closed fracture: Secondary | ICD-10-CM | POA: Diagnosis not present

## 2019-02-06 DIAGNOSIS — Z87891 Personal history of nicotine dependence: Secondary | ICD-10-CM | POA: Insufficient documentation

## 2019-02-06 DIAGNOSIS — Y939 Activity, unspecified: Secondary | ICD-10-CM | POA: Insufficient documentation

## 2019-02-06 DIAGNOSIS — Z7982 Long term (current) use of aspirin: Secondary | ICD-10-CM | POA: Insufficient documentation

## 2019-02-06 DIAGNOSIS — Y999 Unspecified external cause status: Secondary | ICD-10-CM | POA: Insufficient documentation

## 2019-02-06 DIAGNOSIS — W010XXA Fall on same level from slipping, tripping and stumbling without subsequent striking against object, initial encounter: Secondary | ICD-10-CM | POA: Insufficient documentation

## 2019-02-06 DIAGNOSIS — I10 Essential (primary) hypertension: Secondary | ICD-10-CM | POA: Insufficient documentation

## 2019-02-06 DIAGNOSIS — S82891A Other fracture of right lower leg, initial encounter for closed fracture: Secondary | ICD-10-CM

## 2019-02-06 DIAGNOSIS — Y929 Unspecified place or not applicable: Secondary | ICD-10-CM | POA: Insufficient documentation

## 2019-02-06 DIAGNOSIS — Z96659 Presence of unspecified artificial knee joint: Secondary | ICD-10-CM | POA: Insufficient documentation

## 2019-02-06 MED ORDER — OXYCODONE-ACETAMINOPHEN 7.5-325 MG PO TABS: 1.0000 | ORAL_TABLET | Freq: Four times a day (QID) | ORAL | 0 refills | Status: DC | PRN

## 2019-02-06 MED ORDER — ONDANSETRON HCL 8 MG PO TABS: 8.0000 mg | ORAL_TABLET | Freq: Three times a day (TID) | ORAL | 0 refills | Status: DC | PRN

## 2019-02-06 MED ORDER — IBUPROFEN 600 MG PO TABS: 600.0000 mg | ORAL_TABLET | Freq: Three times a day (TID) | ORAL | 0 refills | Status: DC | PRN

## 2019-02-06 MED ORDER — ONDANSETRON 8 MG PO TBDP
8.0000 mg | ORAL_TABLET | Freq: Once | ORAL | Status: AC
  Administered 2019-02-06: 8 mg via ORAL
  Filled 2019-02-06: qty 1

## 2019-02-06 MED ORDER — HYDROMORPHONE HCL 1 MG/ML IJ SOLN
1.0000 mg | Freq: Once | INTRAMUSCULAR | Status: AC
  Administered 2019-02-06: 1 mg via INTRAMUSCULAR
  Filled 2019-02-06: qty 1

## 2019-02-06 NOTE — ED Notes (Signed)
Pt c/o top of cast being too tight, cap refill <3, toes with normal skin coloration, PA Ron in room to assess.

## 2019-02-06 NOTE — ED Notes (Signed)
Bariatric Walker provided for patient to take home.

## 2019-02-06 NOTE — ED Notes (Signed)
Working on getting pt a walker for the appropriate weight. Updated pt on delay.

## 2019-02-06 NOTE — ED Provider Notes (Signed)
[facility] Emergency Department Provider Note   ____________________________________________   First MD Initiated Contact with Patient 02/06/19 1001     (approximate)  I have reviewed the triage vital signs and the nursing notes.   HISTORY  Chief Complaint Ankle Injury    HPI Jasmine Morrow is a 74 y.o. female patient arrived via EMS secondary to to a slip and fall resulting pain and swelling to the right ankle.  Patient rates pain as a 9/10.  Patient described the pain as "sharp".  Patient unable to bear weight secondary to pain and edema.  Patient placed in a splint by EMS prior to arrival.  Patient denies loss of sensation.      Past Medical History:  Diagnosis Date  . Hypertension   . Seasonal allergies     Patient Active Problem List   Diagnosis Date Noted  . Multiple lung nodules 03/31/2014  . 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 OSA on CPAP 03/27/2014    Past Surgical History:  Procedure Laterality Date  . CESAREAN SECTION    . DILATION AND CURETTAGE OF UTERUS    . REPLACEMENT TOTAL KNEE Bilateral 2008   2009  . TUMOR REMOVAL      Prior to Admission medications   Medication Sig Start Date End Date Taking? Authorizing Provider  acetaminophen (TYLENOL) 500 MG tablet Take 500 mg by mouth every 6 (six) hours as needed.    [provider]  albuterol (PROVENTIL HFA;VENTOLIN HFA) 108 (90 Base) MCG/ACT inhaler Inhale 2 puffs into the lungs every 4 (four) hours as needed. 05/09/16   Wilhelmina Mcardle, MD  aspirin EC 81 MG tablet Take 1 tablet by mouth daily.    [provider]  beclomethasone (QVAR) 40 MCG/ACT inhaler Inhale 2 puffs into the lungs 2 (two) times daily. 05/25/16   Wilhelmina Mcardle, MD  Calcium-Vitamin D 600-200 MG-UNIT per tablet Take 1 tablet by mouth 2 (two) times daily.    [provider]  carvedilol (COREG) 6.25 MG tablet Take 1 tablet by mouth daily. 03/25/14   [provider]  Cetirizine HCl  10 MG CAPS Take 1 tablet by mouth daily.    [provider]  estradiol (ESTRACE) 0.1 MG/GM vaginal cream Place 2 g vaginally 2 (two) times a week.    [provider]  ibuprofen (ADVIL) 600 MG tablet Take 1 tablet (600 mg total) by mouth every 8 (eight) hours as needed. 02/06/19   Sable Feil, PA-C  montelukast (SINGULAIR) 10 MG tablet Take 1 tablet (10 mg total) by mouth at bedtime. 05/09/16 05/09/17  Wilhelmina Mcardle, MD  omeprazole (PRILOSEC) 20 MG capsule TAKE ONE CAPSULE BY MOUTH DAILY 04/23/15   [provider]  ondansetron (ZOFRAN) 8 MG tablet Take 1 tablet (8 mg total) by mouth every 8 (eight) hours as needed for nausea or vomiting. 02/06/19   Sable Feil, PA-C  oxyCODONE-acetaminophen (PERCOCET) 7.5-325 MG tablet Take 1 tablet by mouth every 6 (six) hours as needed. 02/06/19   Sable Feil, PA-C  triamcinolone ointment (KENALOG) 0.1 % Apply 1 application topically 2 (two) times daily.    [provider]  triamterene-hydrochlorothiazide (MAXZIDE-25) 37.5-25 MG per tablet Take 1 tablet by mouth daily. 03/25/14   [provider]  UNABLE TO FIND Med Name:Asidis (Vitamin supplement for eyes) Take BID    [provider]    Allergies Celecoxib, Hydrocodone-acetaminophen, Meloxicam, Morphine sulfate er beads, Naproxen, Other, Rofecoxib, Ciprofloxacin-hydrocortisone, Nickel, and Tramadol-acetaminophen  Family History  Problem Relation Age of Onset  . Bone cancer Mother   . COPD Father     Social History Social History   Tobacco Use  . Smoking status: Former Smoker    Packs/day: 2.00    Years: 30.00    Pack years: 60.00    Types: Cigarettes    Quit date: 10/02/1990    Years since quitting: 28.3  . Smokeless tobacco: Never Used  Substance Use Topics  . Alcohol use: No    Alcohol/week: 0.0 standard drinks  . Drug use: No    Review of Systems Constitutional: No fever/chills Eyes: No visual changes. ENT: No sore  throat. Cardiovascular: Denies chest pain. Respiratory: Denies shortness of breath. Gastrointestinal: No abdominal pain.  No nausea, no vomiting.  No diarrhea.  No constipation. Genitourinary: Negative for dysuria. Musculoskeletal: Right ankle pain and deformity. Skin: Negative for rash. Neurological: Negative for headaches, focal weakness or numbness. Endocrine:  Hypertension. Hematological/Lymphatic:  Allergic/Immunilogical: Celebrex, meloxicam, morphine, naproxen, Cipro, hydrocortisone, nickel, and tramadol. ____________________________________________   PHYSICAL EXAM:  VITAL SIGNS: ED Triage Vitals  Enc Vitals Group     BP 02/06/19 0946 (!) 135/56     Pulse Rate 02/06/19 0946 87     Resp 02/06/19 0946 17     Temp 02/06/19 0946 97.8 F (36.6 C)     Temp Source 02/06/19 0946 Oral     SpO2 02/06/19 0946 97 %     Weight 02/06/19 0948 300 lb (136.1 kg)     Height 02/06/19 0948 5\' 8"  (1.727 m)     Head Circumference --      Peak Flow --      Pain Score 02/06/19 0947 9     Pain Loc --      Pain Edu? --      Excl. in [place]? --    Constitutional: Alert and oriented.  Moderate distress.   Neck: No cervical spine tenderness to palpation. Hematological/Lymphatic/Immunilogical: No cervical lymphadenopathy. Cardiovascular: Normal rate, regular rhythm. Grossly normal heart sounds.  Good peripheral circulation. Respiratory: Normal respiratory effort.  No retractions. Lungs CTAB. Musculoskeletal: Obvious edema/ deformity to the right ankle.  Neurologic:  Normal speech and language. No gross focal neurologic deficits are appreciated. No gait instability. Skin:  Skin is warm, dry and intact. No rash noted. Psychiatric: Mood and affect are normal. Speech and behavior are normal.  ____________________________________________   LABS (all labs ordered are listed, but only abnormal results are displayed)  Labs Reviewed - No data to  display ____________________________________________  EKG   ____________________________________________  RADIOLOGY  ED MD interpretation:    Official radiology report(s): DG Lumbar Spine 2-3 Views  Result Date: 02/06/2019 CLINICAL DATA:  Low back pain and right hip pain secondary to a fall in her yard this morning. EXAM: LUMBAR SPINE - 2-3 VIEW COMPARISON:  None. FINDINGS: There is no fracture or bone destruction. Grade 1 spondylolisthesis of L4 on L5 and of L5 on S1 felt to be secondary to severe bilateral facet arthritis. Chronic degenerative disc disease at L1-2 and L2-3 with disc space narrowing and posterior osteophytes protruding into the spinal canal at each of those levels. Minimal lumbar scoliosis with convexity to the right centered at L2-3. IMPRESSION: 1. No acute abnormality. 2. 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 3. Severe degenerative disc disease at L1-2 and L2-3. Electronically Signed   By: Lorriane Shire M.D.   On: 02/06/2019 12:39  DG Ankle Complete Right  Result Date: 02/06/2019 CLINICAL DATA:  Right ankle pain secondary to a fall in the patient's yard. EXAM: RIGHT ANKLE - COMPLETE 3+ VIEW COMPARISON:  None. FINDINGS: There is a fracture dislocation at the right ankle. There are displaced fractures of the medial and lateral malleoli. There is disruption of the distal tibiofibular syndesmosis with dislocation of the foot posteriorly with respect to the tibia. Minimal osteophyte formation on the dorsum of the midfoot. IMPRESSION: 1. Fracture dislocation of the right ankle. 2. Medial and lateral malleolar fractures with disruption of the distal tibiofibular syndesmosis. Electronically Signed   By: Lorriane Shire M.D.   On: 02/06/2019 10:16   CT Ankle Right Wo Contrast  Result Date: 02/06/2019 CLINICAL DATA:  Ankle trauma, slipped and fell in the yard. EXAM: CT OF THE RIGHT ANKLE WITHOUT CONTRAST TECHNIQUE:  Multidetector CT imaging of the right ankle was performed according to the standard protocol. Multiplanar CT image reconstructions were also generated. COMPARISON:  None. FINDINGS: Bones/Joint/Cartilage Acute comminuted fracture of the distal posterior tibial malleolus with 9 mm of distraction at the articular surface. Comminuted and displaced fracture of the medial malleolus. Comminuted fracture of the anterolateral corner of tibial plafond. Posterior subluxation of the talus relative to the tibia. Subchondral cystic changes in the medial and lateral corners of the talar dome. Well corticated bony fragments at the tip of the medial malleolus consistent with sequela prior avulsive injury. Mild osteoarthritis of the subtalar joints. Mild osteoarthritis of the talonavicular joint. Mild osteoarthritis of the navicular-cuneiform joints. Mild osteoarthritis of the second and third tarsometatarsal joints. Small plantar calcaneal spur.  No aggressive osseous lesion. Ligaments Ligaments are suboptimally evaluated by CT. Muscles and Tendons Muscles are normal. No muscle atrophy. Flexor, extensor, peroneal and Achilles tendons are intact. Posterior tibial tendon courses immediately lateral to the major fracture fragment of the posterior malleolus of the distal tibia. Soft tissue No fluid collection or hematoma.  No soft tissue mass. IMPRESSION: 1. Acute comminuted fracture of the distal posterior tibial malleolus with 9 mm of distraction at the articular surface. Comminuted and displaced fracture of the medial malleolus. Comminuted fracture of the anterolateral corner of tibial plafond. Posterior subluxation of the talus relative to the tibia. Electronically Signed   By: Kathreen Devoid   On: 02/06/2019 13:13   DG Hip Unilat W or Wo Pelvis 2-3 Views Right  Result Date: 02/06/2019 CLINICAL DATA:  74 year old female with pain after fall in yard. EXAM: DG HIP (WITH OR WITHOUT PELVIS) 2-3V RIGHT COMPARISON:  None. FINDINGS:  Femoral heads are normally located. Bone mineralization is within normal limits for age. Intact proximal right femur. Proximal left femur appears grossly intact. No pelvis fracture identified. Degenerative changes at the pubic symphysis. Sacral ala and SI joints appear normal. Advanced degenerative changes in the lower lumbar spine. Negative lower abdominal and pelvic visceral contours. IMPRESSION: No acute fracture or dislocation identified about the right hip or pelvis. Electronically Signed   By: Genevie Ann M.D.   On: 02/06/2019 12:39    ____________________________________________   PROCEDURES  Procedure(s) performed (including Critical Care):  Procedures   ____________________________________________   INITIAL IMPRESSION / ASSESSMENT AND PLAN / ED COURSE  As part of my medical decision making, I reviewed the following data within the [facility]     Patient presents with complex fracture of the right ankle secondary to a slip and fall.  Discussed x-ray and CT findings of the right ankle with patient.  Consulted with Dr. Rudene Christians who reduced the subluxation and splinted the patient.  Patient advised to call Dr. Theodore Demark office in 5 days to schedule appointment for surgery.  Patient given discharge care instruction advised on drug effects of medication.  Patient advised to ambulate with support of walker.    BEAUTIFULL RIESBERG was evaluated in Emergency Department on 02/06/2019 for the symptoms described in the history of present illness. She was evaluated in the context of the global COVID-19 pandemic, which necessitated consideration that the patient might be at risk for infection with the SARS-CoV-2 virus that causes COVID-19. Institutional protocols and algorithms that pertain to the evaluation of patients at risk for COVID-19 are in a state of rapid change based on information released by regulatory bodies including the CDC and federal and state organizations. These policies and  algorithms were followed during the patient's care in the ED.       ____________________________________________   FINAL CLINICAL IMPRESSION(S) / ED DIAGNOSES  Final diagnoses:  Closed fracture of right ankle, initial encounter     ED Discharge Orders         Ordered    oxyCODONE-acetaminophen (PERCOCET) 7.5-325 MG tablet  Every 6 hours PRN     02/06/19 1335    ibuprofen (ADVIL) 600 MG tablet  Every 8 hours PRN     02/06/19 1335    ondansetron (ZOFRAN) 8 MG tablet  Every 8 hours PRN     02/06/19 1335           Note:  This document was prepared using Dragon voice recognition software and may include unintentional dictation errors.    Sable Feil, PA-C 02/06/19 1340    Carrie Mew, MD 02/07/19 1521

## 2019-02-06 NOTE — ED Triage Notes (Signed)
Pt states she was on the way to the chiropractor for her back pain and slipped in the yard and fell injuring her right ankle an some pain to the right hip.

## 2019-02-06 NOTE — ED Notes (Signed)
Orthopedics to bedside.

## 2019-02-06 NOTE — Discharge Instructions (Addendum)
Wear splint as directed.  When sitting or laying keep the ankle elevated.  Ambulate only with support of a walker.  Be advised medication may cause drowsiness.

## 2019-02-07 ENCOUNTER — Emergency Department: Payer: Medicare Other

## 2019-02-07 ENCOUNTER — Inpatient Hospital Stay
Admission: EM | Admit: 2019-02-07 | Discharge: 2019-02-14 | DRG: 493 | Disposition: A | Discharge status: Skilled Nursing Facility | Payer: Medicare Other | Attending: Orthopedic Surgery | Admitting: Orthopedic Surgery

## 2019-02-07 ENCOUNTER — Other Ambulatory Visit: Payer: Self-pay

## 2019-02-07 DIAGNOSIS — Z79890 Hormone replacement therapy (postmenopausal): Secondary | ICD-10-CM | POA: Diagnosis not present

## 2019-02-07 DIAGNOSIS — M4316 Spondylolisthesis, lumbar region: Secondary | ICD-10-CM | POA: Diagnosis present

## 2019-02-07 DIAGNOSIS — Z8781 Personal history of (healed) traumatic fracture: Secondary | ICD-10-CM

## 2019-02-07 DIAGNOSIS — Z20822 Contact with and (suspected) exposure to covid-19: Secondary | ICD-10-CM | POA: Diagnosis present

## 2019-02-07 DIAGNOSIS — Z7982 Long term (current) use of aspirin: Secondary | ICD-10-CM | POA: Diagnosis not present

## 2019-02-07 DIAGNOSIS — R918 Other nonspecific abnormal finding of lung field: Secondary | ICD-10-CM | POA: Diagnosis present

## 2019-02-07 DIAGNOSIS — G8918 Other acute postprocedural pain: Secondary | ICD-10-CM | POA: Diagnosis not present

## 2019-02-07 DIAGNOSIS — I10 Essential (primary) hypertension: Secondary | ICD-10-CM | POA: Diagnosis present

## 2019-02-07 DIAGNOSIS — G8929 Other chronic pain: Secondary | ICD-10-CM | POA: Diagnosis present

## 2019-02-07 DIAGNOSIS — B3789 Other sites of candidiasis: Secondary | ICD-10-CM | POA: Diagnosis not present

## 2019-02-07 DIAGNOSIS — Z79899 Other long term (current) drug therapy: Secondary | ICD-10-CM

## 2019-02-07 DIAGNOSIS — W010XXA Fall on same level from slipping, tripping and stumbling without subsequent striking against object, initial encounter: Secondary | ICD-10-CM | POA: Diagnosis present

## 2019-02-07 DIAGNOSIS — Z96653 Presence of artificial knee joint, bilateral: Secondary | ICD-10-CM | POA: Diagnosis present

## 2019-02-07 DIAGNOSIS — Z886 Allergy status to analgesic agent status: Secondary | ICD-10-CM | POA: Diagnosis not present

## 2019-02-07 DIAGNOSIS — Z87891 Personal history of nicotine dependence: Secondary | ICD-10-CM | POA: Diagnosis not present

## 2019-02-07 DIAGNOSIS — Z419 Encounter for procedure for purposes other than remedying health state, unspecified: Secondary | ICD-10-CM

## 2019-02-07 DIAGNOSIS — Z881 Allergy status to other antibiotic agents status: Secondary | ICD-10-CM | POA: Diagnosis not present

## 2019-02-07 DIAGNOSIS — S82891A Other fracture of right lower leg, initial encounter for closed fracture: Secondary | ICD-10-CM | POA: Diagnosis present

## 2019-02-07 DIAGNOSIS — Z888 Allergy status to other drugs, medicaments and biological substances status: Secondary | ICD-10-CM

## 2019-02-07 DIAGNOSIS — J302 Other seasonal allergic rhinitis: Secondary | ICD-10-CM | POA: Diagnosis present

## 2019-02-07 DIAGNOSIS — Z6841 Body Mass Index (BMI) 40.0 and over, adult: Secondary | ICD-10-CM | POA: Diagnosis not present

## 2019-02-07 DIAGNOSIS — J9811 Atelectasis: Secondary | ICD-10-CM | POA: Diagnosis not present

## 2019-02-07 DIAGNOSIS — Z885 Allergy status to narcotic agent status: Secondary | ICD-10-CM | POA: Diagnosis not present

## 2019-02-07 DIAGNOSIS — E662 Morbid (severe) obesity with alveolar hypoventilation: Secondary | ICD-10-CM | POA: Diagnosis present

## 2019-02-07 DIAGNOSIS — Z9889 Other specified postprocedural states: Secondary | ICD-10-CM

## 2019-02-07 DIAGNOSIS — S82851A Displaced trimalleolar fracture of right lower leg, initial encounter for closed fracture: Secondary | ICD-10-CM | POA: Diagnosis present

## 2019-02-07 LAB — CBC WITH DIFFERENTIAL/PLATELET
Abs Immature Granulocytes: 0.05 10*3/uL (ref 0.00–0.07)
Basophils Absolute: 0 10*3/uL (ref 0.0–0.1)
Basophils Relative: 0 %
Eosinophils Absolute: 0 10*3/uL (ref 0.0–0.5)
Eosinophils Relative: 0 %
HCT: 36.3 % (ref 36.0–46.0)
Hemoglobin: 11.8 g/dL — ABNORMAL LOW (ref 12.0–15.0)
Immature Granulocytes: 1 %
Lymphocytes Relative: 4 %
Lymphs Abs: 0.5 10*3/uL — ABNORMAL LOW (ref 0.7–4.0)
MCH: 26.5 pg (ref 26.0–34.0)
MCHC: 32.5 g/dL (ref 30.0–36.0)
MCV: 81.6 fL (ref 80.0–100.0)
Monocytes Absolute: 1.2 10*3/uL — ABNORMAL HIGH (ref 0.1–1.0)
Monocytes Relative: 11 %
Neutro Abs: 8.8 10*3/uL — ABNORMAL HIGH (ref 1.7–7.7)
Neutrophils Relative %: 84 %
Platelets: 169 10*3/uL (ref 150–400)
RBC: 4.45 MIL/uL (ref 3.87–5.11)
RDW: 15 % (ref 11.5–15.5)
WBC: 10.6 10*3/uL — ABNORMAL HIGH (ref 4.0–10.5)
nRBC: 0 % (ref 0.0–0.2)

## 2019-02-07 LAB — COMPREHENSIVE METABOLIC PANEL
ALT: 27 U/L (ref 0–44)
AST: 24 U/L (ref 15–41)
Albumin: 3 g/dL — ABNORMAL LOW (ref 3.5–5.0)
Alkaline Phosphatase: 145 U/L — ABNORMAL HIGH (ref 38–126)
Anion gap: 13 (ref 5–15)
BUN: 50 mg/dL — ABNORMAL HIGH (ref 8–23)
CO2: 24 mmol/L (ref 22–32)
Calcium: 9.8 mg/dL (ref 8.9–10.3)
Chloride: 100 mmol/L (ref 98–111)
Creatinine, Ser: 2.51 mg/dL — ABNORMAL HIGH (ref 0.44–1.00)
GFR calc Af Amer: 21 mL/min — ABNORMAL LOW (ref >60)
GFR calc non Af Amer: 18 mL/min — ABNORMAL LOW (ref >60)
Glucose, Bld: 119 mg/dL — ABNORMAL HIGH (ref 70–99)
Potassium: 3.3 mmol/L — ABNORMAL LOW (ref 3.5–5.1)
Sodium: 137 mmol/L (ref 135–145)
Total Bilirubin: 1.1 mg/dL (ref 0.3–1.2)
Total Protein: 6.7 g/dL (ref 6.5–8.1)

## 2019-02-07 MED ORDER — ZOLPIDEM TARTRATE 5 MG PO TABS
5.0000 mg | ORAL_TABLET | Freq: Every evening | ORAL | Status: DC | PRN
  Administered 2019-02-08: 5 mg via ORAL
  Filled 2019-02-07: qty 1

## 2019-02-07 MED ORDER — DOCUSATE SODIUM 100 MG PO CAPS
100.0000 mg | ORAL_CAPSULE | Freq: Two times a day (BID) | ORAL | Status: DC
  Administered 2019-02-07 – 2019-02-14 (×12): 100 mg via ORAL
  Filled 2019-02-07 (×13): qty 1

## 2019-02-07 MED ORDER — FENTANYL CITRATE (PF) 100 MCG/2ML IJ SOLN: 50.0000 ug | INTRAMUSCULAR | Status: DC | PRN

## 2019-02-07 MED ORDER — HYDROMORPHONE HCL 1 MG/ML IJ SOLN
0.5000 mg | INTRAMUSCULAR | Status: DC | PRN
  Administered 2019-02-07 (×2): 1 mg via INTRAVENOUS
  Filled 2019-02-07 (×2): qty 1

## 2019-02-07 MED ORDER — LORATADINE 10 MG PO TABS
10.0000 mg | ORAL_TABLET | Freq: Every day | ORAL | Status: DC
  Administered 2019-02-10 – 2019-02-14 (×5): 10 mg via ORAL
  Filled 2019-02-07 (×6): qty 1

## 2019-02-07 MED ORDER — CYCLOBENZAPRINE HCL 10 MG PO TABS: 10.0000 mg | ORAL_TABLET | Freq: Every day | ORAL | Status: DC | PRN

## 2019-02-07 MED ORDER — BISACODYL 5 MG PO TBEC
5.0000 mg | DELAYED_RELEASE_TABLET | Freq: Every day | ORAL | Status: DC | PRN
  Administered 2019-02-10 – 2019-02-12 (×2): 5 mg via ORAL
  Filled 2019-02-07 (×2): qty 1

## 2019-02-07 MED ORDER — ALBUTEROL SULFATE (2.5 MG/3ML) 0.083% IN NEBU: 2.5000 mg | INHALATION_SOLUTION | RESPIRATORY_TRACT | Status: DC | PRN

## 2019-02-07 MED ORDER — OXYCODONE HCL 5 MG PO TABS
5.0000 mg | ORAL_TABLET | ORAL | Status: DC | PRN
  Administered 2019-02-08 (×2): 5 mg via ORAL
  Administered 2019-02-09: 10 mg via ORAL
  Administered 2019-02-09: 5 mg via ORAL
  Filled 2019-02-07 (×2): qty 1
  Filled 2019-02-07: qty 2
  Filled 2019-02-07: qty 1

## 2019-02-07 MED ORDER — MAGNESIUM HYDROXIDE 400 MG/5ML PO SUSP
30.0000 mL | Freq: Every day | ORAL | Status: DC | PRN
  Administered 2019-02-10: 30 mL via ORAL
  Filled 2019-02-07 (×2): qty 30

## 2019-02-07 MED ORDER — ACETAMINOPHEN 325 MG PO TABS
325.0000 mg | ORAL_TABLET | Freq: Four times a day (QID) | ORAL | Status: DC | PRN
  Administered 2019-02-09 – 2019-02-13 (×8): 650 mg via ORAL
  Filled 2019-02-07 (×8): qty 2

## 2019-02-07 MED ORDER — ASPIRIN EC 81 MG PO TBEC
81.0000 mg | DELAYED_RELEASE_TABLET | Freq: Every day | ORAL | Status: DC
  Administered 2019-02-09 – 2019-02-14 (×6): 81 mg via ORAL
  Filled 2019-02-07 (×7): qty 1

## 2019-02-07 MED ORDER — POLYVINYL ALCOHOL 1.4 % OP SOLN
1.0000 [drp] | Freq: Four times a day (QID) | OPHTHALMIC | Status: DC | PRN
  Filled 2019-02-07: qty 15

## 2019-02-07 MED ORDER — MAGNESIUM CITRATE PO SOLN
1.0000 | Freq: Once | ORAL | Status: DC | PRN
  Filled 2019-02-07: qty 296

## 2019-02-07 MED ORDER — ACETAMINOPHEN 500 MG PO TABS
1000.0000 mg | ORAL_TABLET | Freq: Four times a day (QID) | ORAL | Status: AC
  Administered 2019-02-07: 1000 mg via ORAL
  Filled 2019-02-07: qty 2

## 2019-02-07 MED ORDER — OCUVITE-LUTEIN PO CAPS
2.0000 | ORAL_CAPSULE | Freq: Two times a day (BID) | ORAL | Status: DC
  Administered 2019-02-07 – 2019-02-14 (×13): 2 via ORAL
  Filled 2019-02-07 (×15): qty 2

## 2019-02-07 MED ORDER — SODIUM CHLORIDE 0.9 % IV SOLN: INTRAVENOUS | Status: DC

## 2019-02-07 MED ORDER — METHOCARBAMOL 1000 MG/10ML IJ SOLN
500.0000 mg | Freq: Four times a day (QID) | INTRAVENOUS | Status: DC | PRN
  Filled 2019-02-07 (×2): qty 5

## 2019-02-07 MED ORDER — METHOCARBAMOL 500 MG PO TABS
500.0000 mg | ORAL_TABLET | Freq: Four times a day (QID) | ORAL | Status: DC | PRN
  Administered 2019-02-08 – 2019-02-10 (×3): 500 mg via ORAL
  Filled 2019-02-07 (×3): qty 1

## 2019-02-07 MED ORDER — CALCIUM CARBONATE-VITAMIN D 500-200 MG-UNIT PO TABS
2.0000 | ORAL_TABLET | Freq: Every day | ORAL | Status: DC
  Administered 2019-02-09 – 2019-02-13 (×5): 2 via ORAL
  Filled 2019-02-07 (×6): qty 2

## 2019-02-07 MED ORDER — PANTOPRAZOLE SODIUM 40 MG PO TBEC
40.0000 mg | DELAYED_RELEASE_TABLET | Freq: Every day | ORAL | Status: DC
  Administered 2019-02-09 – 2019-02-14 (×6): 40 mg via ORAL
  Filled 2019-02-07 (×6): qty 1

## 2019-02-07 MED ORDER — ONDANSETRON HCL 4 MG/2ML IJ SOLN: 4.0000 mg | Freq: Four times a day (QID) | INTRAMUSCULAR | Status: DC | PRN

## 2019-02-07 MED ORDER — OXYCODONE HCL 5 MG PO TABS: 10.0000 mg | ORAL_TABLET | ORAL | Status: DC | PRN

## 2019-02-07 MED ORDER — DIPHENHYDRAMINE HCL 12.5 MG/5ML PO ELIX
12.5000 mg | ORAL_SOLUTION | ORAL | Status: DC | PRN
  Filled 2019-02-07: qty 10

## 2019-02-07 MED ORDER — CARVEDILOL 3.125 MG PO TABS
3.1250 mg | ORAL_TABLET | Freq: Two times a day (BID) | ORAL | Status: DC
  Administered 2019-02-08 – 2019-02-14 (×13): 3.125 mg via ORAL
  Filled 2019-02-07 (×15): qty 1

## 2019-02-07 MED ORDER — ONDANSETRON HCL 4 MG PO TABS
4.0000 mg | ORAL_TABLET | Freq: Four times a day (QID) | ORAL | Status: DC | PRN
  Administered 2019-02-08: 4 mg via ORAL
  Filled 2019-02-07: qty 1

## 2019-02-07 MED ORDER — TRIAMTERENE-HCTZ 37.5-25 MG PO TABS
1.0000 | ORAL_TABLET | Freq: Every day | ORAL | Status: DC
  Administered 2019-02-09 – 2019-02-14 (×5): 1 via ORAL
  Filled 2019-02-07 (×8): qty 1

## 2019-02-07 NOTE — H&P (Signed)
Subjective:   Patient is a 74 y.o. female presents with complaints of right ankle pain.. Onset of symptoms was abrupt starting yesterday when she fell in her backyard.  She has been having problems with her back and she relates her in the fall to this.  She was seen in the emergency room and splinted but overnight has been unable to mobilize her self and is unsafe with her unable to get out of bed she has been urinating on herself and is admitted for definitive treatment of her ankle fracture dislocation.     ago with unchanged course since that time. The pain is located around the right ankle. Patient describes the pain as fairly severe continuous and rated as  . Pain has been associated with she also reports back and hip pain those were x-rayed yesterday and show severe degenerative changes to her lumbar spine with some spondylolisthesis . Patient denies loss of consciousness or any prodromal symptoms.. Symptoms are aggravated by any motion of the leg. Symptoms improve with elevation and ice. Past history includes bilateral knee replacements which have done well.  Previous studies include ankle x-rays and CT scan.  Patient Active Problem List   Diagnosis Date Noted  . Trimalleolar fracture of ankle, closed, right, initial encounter 02/07/2019  . Multiple lung nodules 03/31/2014  . 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 OSA on CPAP 03/27/2014   Past Medical History:  Diagnosis Date  . Hypertension   . Seasonal allergies     Past Surgical History:  Procedure Laterality Date  . CESAREAN SECTION    . DILATION AND CURETTAGE OF UTERUS    . REPLACEMENT TOTAL KNEE Bilateral 2008   2009  . TUMOR REMOVAL      (Not in a hospital admission)  Allergies  Allergen Reactions  . Celecoxib Nausea Only  . Hydrocodone-Acetaminophen Nausea And Vomiting  . Meloxicam Nausea Only  . Morphine Sulfate Er Beads Itching  . Naproxen Swelling  . Rofecoxib Nausea Only  . Ciprofloxacin Rash  . Nickel Rash  .  Tramadol-Acetaminophen Nausea Only and Rash    Social History   Tobacco Use  . Smoking status: Former Smoker    Packs/day: 2.00    Years: 30.00    Pack years: 60.00    Types: Cigarettes    Quit date: 10/02/1990    Years since quitting: 28.3  . Smokeless tobacco: Never Used  Substance Use Topics  . Alcohol use: No    Alcohol/week: 0.0 standard drinks    Family History  Problem Relation Age of Onset  . Bone cancer Mother   . COPD Father     Review of Systems Pertinent items are noted in HPI.  Objective:   Patient Vitals for the past 8 hrs:  BP Pulse Resp SpO2 Height Weight  02/07/19 1501 -- -- -- -- 5\' 8"  (1.727 m) 136.1 kg  02/07/19 1457 (!) 121/52 84 18 94 % -- --   No intake/output data recorded. No intake/output data recorded.    BP (!) 121/52 (BP Location: Left Wrist)   Pulse 84   Resp 18   Ht 5\' 8"  (1.727 m)   Wt 136.1 kg   SpO2 94%   BMI 45.61 kg/m  General appearance: moderate distress Lungs: clear to auscultation bilaterally Heart: regular rate and rhythm, S1, S2 normal, no murmur, click, rub or gallop Extremities: On extremity exam the right ankle had swelling and ecchymosis and now is in a splint.  She has intact sensation to the toes  able to flex extend the toes on the right foot and has brisk capillary refill. Skin: Skin color, texture, turgor normal. No rashes or lesions  ECG: Pending result.  Data ReviewRadiology review: X-rays and CT scan show trimalleolar ankle fracture dislocation with large posterior mall fragment.  There is also medial malleolus fragment and syndesmosis rupture with some loose fragments in the joint.  Assessment:   Active Problems:   Trimalleolar fracture of ankle, closed, right, initial encounter   Plan:   Plan is for open reduction internal fixation tomorrow.  As she is so limited in her mobility preoperatively I think she may end up requiring rehab stay postop.  Discharge planning consult placed.

## 2019-02-07 NOTE — ED Provider Notes (Signed)
[facility] Emergency Department Provider Note    First MD Initiated Contact with Patient 02/07/19 1501     (approximate)  I have reviewed the triage vital signs and the nursing notes.   HISTORY  Chief Complaint Right ankle pain   HPI Jasmine Morrow is a 74 y.o. female with ankle fracture sustained yesterday represents the ER due to inability to care for herself at home and failing outpatient management.  She is complaining of significant right ankle pain.  Had discussed with orthopedic Dr. Rudene Christians who plans to admit patient for operative management tomorrow.  She denies any other symptoms or pain at this time.    Past Medical History:  Diagnosis Date  . Hypertension   . Seasonal allergies    Family History  Problem Relation Age of Onset  . Bone cancer Mother   . COPD Father    Past Surgical History:  Procedure Laterality Date  . CESAREAN SECTION    . DILATION AND CURETTAGE OF UTERUS    . REPLACEMENT TOTAL KNEE Bilateral 2008   2009  . TUMOR REMOVAL     Patient Active Problem List   Diagnosis Date Noted  . Multiple lung nodules 03/31/2014  . 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 OSA on CPAP 03/27/2014      Prior to Admission medications   Medication Sig Start Date End Date Taking? Authorizing Provider  acetaminophen (TYLENOL) 500 MG tablet Take 1,000 mg by mouth every 6 (six) hours as needed for mild pain or headache.     [provider]  albuterol (PROVENTIL HFA;VENTOLIN HFA) 108 (90 Base) MCG/ACT inhaler Inhale 2 puffs into the lungs every 4 (four) hours as needed. Patient taking differently: Inhale 2 puffs into the lungs every 4 (four) hours as needed for wheezing or shortness of breath.  05/09/16   Wilhelmina Mcardle, MD  aspirin EC 81 MG tablet Take 81 mg by mouth daily.     [provider]  Calcium-Vitamin D 600-200 MG-UNIT per tablet Take 2 tablets by mouth daily.     [provider]  carvedilol (COREG) 3.125 MG  tablet Take 3.125 mg by mouth 2 (two) times daily. 12/07/18   [provider]  Cetirizine HCl 10 MG CAPS Take 10 mg by mouth daily.     [provider]  cyclobenzaprine (FLEXERIL) 10 MG tablet Take 10 mg by mouth daily as needed for muscle spasms.    [provider]  fluocinonide gel (LIDEX) [id number] % Apply 1 application topically 3 (three) times daily as needed. Apply to mouth lesions as needed 01/17/19   [provider]  hydroxypropyl methylcellulose / hypromellose (ISOPTO TEARS / GONIOVISC) 2.5 % ophthalmic solution Place 1 drop into both eyes 4 (four) times daily as needed for dry eyes.    [provider]  ibuprofen (ADVIL) 600 MG tablet Take 1 tablet (600 mg total) by mouth every 8 (eight) hours as needed. Patient taking differently: Take 600 mg by mouth every 8 (eight) hours as needed for moderate pain.  02/06/19   Sable Feil, PA-C  montelukast (SINGULAIR) 10 MG tablet Take 1 tablet (10 mg total) by mouth at bedtime. 05/09/16 05/09/17  Wilhelmina Mcardle, MD  Multiple Vitamin (MULTIVITAMIN WITH MINERALS) TABS tablet Take 1 tablet by mouth daily.    [provider]  Multiple Vitamins-Minerals (PRESERVISION AREDS 2 PO) Take 1 tablet by mouth 2 (two) times daily.    [provider]  NONFORMULARY OR COMPOUNDED  ITEM Apply 1 application topically See admin instructions. Compounded cream containing estradiol. Applies vaginally twice a week.    [provider]  omeprazole (PRILOSEC) 20 MG capsule Take 20 mg by mouth 2 (two) times daily as needed (Heartburn).  04/23/15   [provider]  ondansetron (ZOFRAN) 8 MG tablet Take 1 tablet (8 mg total) by mouth every 8 (eight) hours as needed for nausea or vomiting. 02/06/19   Sable Feil, PA-C  oxyCODONE-acetaminophen (PERCOCET) 7.5-325 MG tablet Take 1 tablet by mouth every 6 (six) hours as needed. Patient taking differently: Take 1 tablet by mouth every 6 (six) hours as needed for  severe pain.  02/06/19   Sable Feil, PA-C  triamterene-hydrochlorothiazide (MAXZIDE-25) 37.5-25 MG tablet Take 1 tablet by mouth daily. 03/21/18   [provider]    Allergies Celecoxib, Hydrocodone-acetaminophen, Meloxicam, Morphine sulfate er beads, Naproxen, Rofecoxib, Ciprofloxacin, Nickel, and Tramadol-acetaminophen    Social History Social History   Tobacco Use  . Smoking status: Former Smoker    Packs/day: 2.00    Years: 30.00    Pack years: 60.00    Types: Cigarettes    Quit date: 10/02/1990    Years since quitting: 28.3  . Smokeless tobacco: Never Used  Substance Use Topics  . Alcohol use: No    Alcohol/week: 0.0 standard drinks  . Drug use: No    Review of Systems Patient denies headaches, rhinorrhea, blurry vision, numbness, shortness of breath, chest pain, edema, cough, abdominal pain, nausea, vomiting, diarrhea, dysuria, fevers, rashes or hallucinations unless otherwise stated above in HPI. ____________________________________________   PHYSICAL EXAM:  VITAL SIGNS: Vitals:   02/07/19 1457  BP: (!) 121/52  Pulse: 84  Resp: 18  SpO2: 94%    Constitutional: Alert and oriented.  Eyes: Conjunctivae are normal.  Head: Atraumatic. Nose: No congestion/rhinnorhea. Mouth/Throat: Mucous membranes are moist.   Neck: No stridor. Painless ROM.  Cardiovascular: Normal rate, regular rhythm. Grossly normal heart sounds.  Good peripheral circulation. Respiratory: Normal respiratory effort.  No retractions. Lungs CTAB. Gastrointestinal: Soft and nontender. No distention. No abdominal bruits. No CVA tenderness. Genitourinary:  Musculoskeletal: RLE in splint. N/V intact.  No joint effusions. Neurologic:  Normal speech and language. No gross focal neurologic deficits are appreciated. No facial droop Skin:  Skin is warm, dry and intact. No rash noted. Psychiatric: Mood and affect are normal. Speech and behavior are  normal.  ____________________________________________   LABS (all labs ordered are listed, but only abnormal results are displayed)  No results found for this or any previous visit (from the past 24 hour(s)). ____________________________________________  EKG My review and personal interpretation at Time: 15:41   Indication: preop  Rate: 80  Rhythm: sinus Axis: normal Other: normal intervals, no stemi ____________________________________________  RADIOLOGY I personally reviewed all radiographic images ordered to evaluate for the above acute complaints and reviewed radiology reports and findings.  These findings were personally discussed with the patient.  Please see medical record for radiology report.   ____________________________________________   PROCEDURES  Procedure(s) performed:  Procedures    Critical Care performed: no ____________________________________________   INITIAL IMPRESSION / ASSESSMENT AND PLAN / ED COURSE  Pertinent labs & imaging results that were available during my care of the patient were reviewed by me and considered in my medical decision making (see chart for details).   DDX: fracture, contusion, dislocation  Jasmine Morrow is a 74 y.o. who presents to the ED with injury as described above will be admitted to  the hospital for operative management.  Discussed case with Dr. Rudene Christians who will be admitting patient     The patient was evaluated in Emergency Department today for the symptoms described in the history of present illness. He/she was evaluated in the context of the global COVID-19 pandemic, which necessitated consideration that the patient might be at risk for infection with the SARS-CoV-2 virus that causes COVID-19. Institutional protocols and algorithms that pertain to the evaluation of patients at risk for COVID-19 are in a state of rapid change based on information released by regulatory bodies including the CDC and federal and state  organizations. These policies and algorithms were followed during the patient's care in the ED.  As part of my medical decision making, I reviewed the following data within the [facility] notes reviewed and incorporated, Labs reviewed, notes from prior ED visits and [place] Controlled Substance Database   ____________________________________________   FINAL CLINICAL IMPRESSION(S) / ED DIAGNOSES  Final diagnoses:  Closed fracture of right ankle, initial encounter      NEW MEDICATIONS STARTED DURING THIS VISIT:  New Prescriptions   No medications on file     Note:  This document was prepared using Dragon voice recognition software and may include unintentional dictation errors.    Merlyn Lot, MD 02/07/19 1556

## 2019-02-07 NOTE — ED Notes (Signed)
Pt with right leg in splint from this ED yesterday, capillary refill good, toes warm, pt able to move toes.

## 2019-02-07 NOTE — ED Triage Notes (Signed)
Pt was seen in ED yesterday after falling and breaking right ankle, went home via EMS as husband cannot transport her. Returned today as she states she cannot care for herself at home and husband cannot care for her either.

## 2019-02-07 NOTE — ED Triage Notes (Signed)
Pt states she is unable to take care of herself at home. Pt took some percocet and has some mild itching. Has right ankle surgery scheduled for tomorrow. Called EMS because pt cannot bear weight on right leg.

## 2019-02-08 ENCOUNTER — Other Ambulatory Visit: Payer: Self-pay | Admitting: Orthopedic Surgery

## 2019-02-08 ENCOUNTER — Encounter: Payer: Self-pay | Admitting: Orthopedic Surgery

## 2019-02-08 ENCOUNTER — Inpatient Hospital Stay: Payer: Medicare Other | Admitting: Anesthesiology

## 2019-02-08 ENCOUNTER — Ambulatory Visit: Admission: RE | Admit: 2019-02-08 | Payer: Medicare Other | Source: Ambulatory Visit | Admitting: Orthopedic Surgery

## 2019-02-08 ENCOUNTER — Inpatient Hospital Stay: Payer: Medicare Other

## 2019-02-08 ENCOUNTER — Encounter
Admission: EM | Disposition: A | Discharge status: Skilled Nursing Facility | Payer: Self-pay | Source: Home / Self Care | Attending: Orthopedic Surgery

## 2019-02-08 HISTORY — PX: SYNDESMOSIS REPAIR: SHX5182

## 2019-02-08 HISTORY — PX: ORIF ANKLE FRACTURE: SHX5408

## 2019-02-08 LAB — CBC
HCT: 35.8 % — ABNORMAL LOW (ref 36.0–46.0)
Hemoglobin: 11.5 g/dL — ABNORMAL LOW (ref 12.0–15.0)
MCH: 26.4 pg (ref 26.0–34.0)
MCHC: 32.1 g/dL (ref 30.0–36.0)
MCV: 82.3 fL (ref 80.0–100.0)
Platelets: 192 10*3/uL (ref 150–400)
RBC: 4.35 MIL/uL (ref 3.87–5.11)
RDW: 15 % (ref 11.5–15.5)
WBC: 11.8 10*3/uL — ABNORMAL HIGH (ref 4.0–10.5)
nRBC: 0 % (ref 0.0–0.2)

## 2019-02-08 LAB — CREATININE, SERUM
Creatinine, Ser: 1.5 mg/dL — ABNORMAL HIGH (ref 0.44–1.00)
GFR calc Af Amer: 40 mL/min — ABNORMAL LOW (ref >60)
GFR calc non Af Amer: 34 mL/min — ABNORMAL LOW (ref >60)

## 2019-02-08 LAB — SURGICAL PCR SCREEN
MRSA, PCR: NEGATIVE
Staphylococcus aureus: POSITIVE — AB

## 2019-02-08 LAB — SARS CORONAVIRUS 2 (TAT 6-24 HRS): SARS Coronavirus 2: NEGATIVE

## 2019-02-08 SURGERY — OPEN REDUCTION INTERNAL FIXATION (ORIF) ANKLE FRACTURE
Anesthesia: General | Site: Leg Lower | Laterality: Right

## 2019-02-08 MED ORDER — ONDANSETRON HCL 4 MG/2ML IJ SOLN: 4.0000 mg | Freq: Once | INTRAMUSCULAR | Status: DC | PRN

## 2019-02-08 MED ORDER — GLYCOPYRROLATE 0.2 MG/ML IJ SOLN
INTRAMUSCULAR | Status: DC | PRN
  Administered 2019-02-08: .1 mg via INTRAVENOUS

## 2019-02-08 MED ORDER — CEFAZOLIN SODIUM-DEXTROSE 2-4 GM/100ML-% IV SOLN
2.0000 g | Freq: Four times a day (QID) | INTRAVENOUS | Status: AC
  Administered 2019-02-08 – 2019-02-09 (×3): 2 g via INTRAVENOUS
  Filled 2019-02-08 (×3): qty 100

## 2019-02-08 MED ORDER — FENTANYL CITRATE (PF) 100 MCG/2ML IJ SOLN
INTRAMUSCULAR | Status: AC
  Administered 2019-02-08: 25 ug via INTRAVENOUS
  Filled 2019-02-08: qty 2

## 2019-02-08 MED ORDER — BISACODYL 10 MG RE SUPP
10.0000 mg | Freq: Every day | RECTAL | Status: DC | PRN
  Administered 2019-02-11: 10 mg via RECTAL
  Filled 2019-02-08: qty 1

## 2019-02-08 MED ORDER — BUPIVACAINE LIPOSOME 1.3 % IJ SUSP
INTRAMUSCULAR | Status: AC
  Filled 2019-02-08: qty 20

## 2019-02-08 MED ORDER — POTASSIUM CHLORIDE IN NACL 20-0.9 MEQ/L-% IV SOLN
INTRAVENOUS | Status: DC
  Filled 2019-02-08 (×5): qty 1000

## 2019-02-08 MED ORDER — PHENYLEPHRINE HCL (PRESSORS) 10 MG/ML IV SOLN
INTRAVENOUS | Status: DC | PRN
  Administered 2019-02-08 (×3): 100 ug via INTRAVENOUS
  Administered 2019-02-08: 200 ug via INTRAVENOUS
  Administered 2019-02-08 (×2): 100 ug via INTRAVENOUS

## 2019-02-08 MED ORDER — BUPIVACAINE HCL (PF) 0.25 % IJ SOLN
INTRAMUSCULAR | Status: AC
  Filled 2019-02-08: qty 30

## 2019-02-08 MED ORDER — CHLORHEXIDINE GLUCONATE 4 % EX LIQD
60.0000 mL | Freq: Once | CUTANEOUS | Status: AC
  Administered 2019-02-08: 4 via TOPICAL

## 2019-02-08 MED ORDER — CEFAZOLIN SODIUM-DEXTROSE 2-4 GM/100ML-% IV SOLN
2.0000 g | INTRAVENOUS | Status: DC
  Filled 2019-02-08: qty 100

## 2019-02-08 MED ORDER — EPINEPHRINE PF 1 MG/ML IJ SOLN
INTRAMUSCULAR | Status: AC
  Filled 2019-02-08: qty 1

## 2019-02-08 MED ORDER — ENOXAPARIN SODIUM 40 MG/0.4ML ~~LOC~~ SOLN
40.0000 mg | SUBCUTANEOUS | Status: DC
  Administered 2019-02-09 – 2019-02-12 (×4): 40 mg via SUBCUTANEOUS
  Filled 2019-02-08 (×4): qty 0.4

## 2019-02-08 MED ORDER — DEXMEDETOMIDINE HCL 200 MCG/2ML IV SOLN
INTRAVENOUS | Status: DC | PRN
  Administered 2019-02-08 (×2): 8 ug via INTRAVENOUS

## 2019-02-08 MED ORDER — METOCLOPRAMIDE HCL 10 MG PO TABS
5.0000 mg | ORAL_TABLET | Freq: Three times a day (TID) | ORAL | Status: DC | PRN
  Filled 2019-02-08: qty 1

## 2019-02-08 MED ORDER — SODIUM CHLORIDE FLUSH 0.9 % IV SOLN
INTRAVENOUS | Status: AC
  Filled 2019-02-08: qty 10

## 2019-02-08 MED ORDER — METOCLOPRAMIDE HCL 5 MG/ML IJ SOLN: 5.0000 mg | Freq: Three times a day (TID) | INTRAMUSCULAR | Status: DC | PRN

## 2019-02-08 MED ORDER — POTASSIUM CHLORIDE 20 MEQ PO PACK
40.0000 meq | PACK | Freq: Once | ORAL | Status: AC
  Administered 2019-02-08: 40 meq via ORAL
  Filled 2019-02-08: qty 2

## 2019-02-08 MED ORDER — DOCUSATE SODIUM 100 MG PO CAPS: 100.0000 mg | ORAL_CAPSULE | Freq: Two times a day (BID) | ORAL | Status: DC

## 2019-02-08 MED ORDER — SUCCINYLCHOLINE CHLORIDE 20 MG/ML IJ SOLN
INTRAMUSCULAR | Status: DC | PRN
  Administered 2019-02-08: 120 mg via INTRAVENOUS

## 2019-02-08 MED ORDER — SODIUM CHLORIDE 0.9 % IV SOLN
INTRAVENOUS | Status: DC | PRN
  Administered 2019-02-08: 25 ug/min via INTRAVENOUS

## 2019-02-08 MED ORDER — ROCURONIUM BROMIDE 100 MG/10ML IV SOLN
INTRAVENOUS | Status: DC | PRN
  Administered 2019-02-08: 10 mg via INTRAVENOUS
  Administered 2019-02-08 (×2): 20 mg via INTRAVENOUS
  Administered 2019-02-08: 10 mg via INTRAVENOUS
  Administered 2019-02-08: 20 mg via INTRAVENOUS

## 2019-02-08 MED ORDER — KETAMINE HCL 50 MG/ML IJ SOLN
INTRAMUSCULAR | Status: DC | PRN
  Administered 2019-02-08: 25 mg via INTRAMUSCULAR

## 2019-02-08 MED ORDER — FENTANYL CITRATE (PF) 100 MCG/2ML IJ SOLN
25.0000 ug | INTRAMUSCULAR | Status: DC | PRN
  Administered 2019-02-08 (×3): 25 ug via INTRAVENOUS

## 2019-02-08 MED ORDER — SODIUM CHLORIDE (PF) 0.9 % IJ SOLN
INTRAMUSCULAR | Status: AC
  Filled 2019-02-08: qty 50

## 2019-02-08 MED ORDER — LIDOCAINE HCL (CARDIAC) PF 100 MG/5ML IV SOSY
PREFILLED_SYRINGE | INTRAVENOUS | Status: DC | PRN
  Administered 2019-02-08: 100 mg via INTRAVENOUS

## 2019-02-08 MED ORDER — FENTANYL CITRATE (PF) 100 MCG/2ML IJ SOLN
INTRAMUSCULAR | Status: DC | PRN
  Administered 2019-02-08 (×3): 50 ug via INTRAVENOUS

## 2019-02-08 MED ORDER — NEOMYCIN-POLYMYXIN B GU 40-200000 IR SOLN
Status: AC
  Filled 2019-02-08: qty 20

## 2019-02-08 MED ORDER — SODIUM CHLORIDE 0.9 % IV BOLUS
500.0000 mL | Freq: Once | INTRAVENOUS | Status: AC
  Administered 2019-02-08: 500 mL via INTRAVENOUS

## 2019-02-08 MED ORDER — CHLORHEXIDINE GLUCONATE CLOTH 2 % EX PADS
6.0000 | MEDICATED_PAD | Freq: Every day | CUTANEOUS | Status: DC
  Administered 2019-02-08 – 2019-02-12 (×3): 6 via TOPICAL

## 2019-02-08 MED ORDER — PROPOFOL 10 MG/ML IV BOLUS
INTRAVENOUS | Status: DC | PRN
  Administered 2019-02-08: 130 mg via INTRAVENOUS

## 2019-02-08 MED ORDER — DEXAMETHASONE SODIUM PHOSPHATE 10 MG/ML IJ SOLN
INTRAMUSCULAR | Status: DC | PRN
  Administered 2019-02-08: 4 mg via INTRAVENOUS

## 2019-02-08 MED ORDER — SUGAMMADEX SODIUM 500 MG/5ML IV SOLN
INTRAVENOUS | Status: DC | PRN
  Administered 2019-02-08: 500 mg via INTRAVENOUS

## 2019-02-08 MED ORDER — DEXTROSE 5 % IV SOLN
3.0000 g | Freq: Once | INTRAVENOUS | Status: AC
  Administered 2019-02-08: 3 g via INTRAVENOUS
  Filled 2019-02-08: qty 3000
  Filled 2019-02-08: qty 3

## 2019-02-08 MED ORDER — ONDANSETRON HCL 4 MG/2ML IJ SOLN
INTRAMUSCULAR | Status: DC | PRN
  Administered 2019-02-08: 4 mg via INTRAVENOUS

## 2019-02-08 SURGICAL SUPPLY — 65 items
BIT DRILL 2.5X2.75 QC CALB (BIT) ×3 IMPLANT
BIT DRILL 3.5X5.5 QC CALB (BIT) ×3 IMPLANT
BNDG ELASTIC 4X5.8 VLCR STR LF (GAUZE/BANDAGES/DRESSINGS) ×6 IMPLANT
CANISTER SUCT 1200ML W/VALVE (MISCELLANEOUS) ×3 IMPLANT
CHLORAPREP W/TINT 26 (MISCELLANEOUS) ×6 IMPLANT
COVER WAND RF STERILE (DRAPES) ×3 IMPLANT
CUFF TOURN SGL QUICK 24 (TOURNIQUET CUFF)
CUFF TOURN SGL QUICK 30 (TOURNIQUET CUFF)
CUFF TOURN SGL QUICK 42 (TOURNIQUET CUFF) ×3 IMPLANT
CUFF TRNQT CYL 24X4X16.5-23 (TOURNIQUET CUFF) IMPLANT
CUFF TRNQT CYL 30X4X21-28X (TOURNIQUET CUFF) IMPLANT
DRAPE C-ARMOR (DRAPES) ×3 IMPLANT
DRAPE FLUOR MINI C-ARM 54X84 (DRAPES) IMPLANT
DRAPE INCISE IOBAN 66X45 STRL (DRAPES) ×3 IMPLANT
DRAPE U-SHAPE 47X51 STRL (DRAPES) ×3 IMPLANT
DRSG EMULSION OIL 3X8 NADH (GAUZE/BANDAGES/DRESSINGS) ×3 IMPLANT
ELECT CAUTERY BLADE 6.4 (BLADE) ×3 IMPLANT
ELECT REM PT RETURN 9FT ADLT (ELECTROSURGICAL) ×3
ELECTRODE REM PT RTRN 9FT ADLT (ELECTROSURGICAL) ×2 IMPLANT
FIXATION ZIPTIGHT ANKLE SNDSMS (Ankle) ×4 IMPLANT
GAUZE SPONGE 4X4 12PLY STRL (GAUZE/BANDAGES/DRESSINGS) ×3 IMPLANT
GAUZE XEROFORM 1X8 LF (GAUZE/BANDAGES/DRESSINGS) ×3 IMPLANT
GLOVE SURG SYN 9.0  PF PI (GLOVE) ×1
GLOVE SURG SYN 9.0 PF PI (GLOVE) ×2 IMPLANT
GOWN SRG 2XL LVL 4 RGLN SLV (GOWNS) ×2 IMPLANT
GOWN STRL NON-REIN 2XL LVL4 (GOWNS) ×1
GOWN STRL REUS W/ TWL LRG LVL3 (GOWN DISPOSABLE) ×2 IMPLANT
GOWN STRL REUS W/TWL LRG LVL3 (GOWN DISPOSABLE) ×1
HEMOVAC 400ML (MISCELLANEOUS)
K-WIRE ACE 1.6X6 (WIRE) ×3
KIT DRAIN HEMOVAC JP 7FR 400ML (MISCELLANEOUS) IMPLANT
KIT TURNOVER KIT A (KITS) ×3 IMPLANT
KWIRE ACE 1.6X6 (WIRE) ×2 IMPLANT
LABEL OR SOLS (LABEL) ×3 IMPLANT
NS IRRIG 1000ML POUR BTL (IV SOLUTION) ×3 IMPLANT
PACK EXTREMITY ARMC (MISCELLANEOUS) ×3 IMPLANT
PAD ABD DERMACEA PRESS 5X9 (GAUZE/BANDAGES/DRESSINGS) ×6 IMPLANT
PAD CAST CTTN 4X4 STRL (SOFTGOODS) ×4 IMPLANT
PAD PREP 24X41 OB/GYN DISP (PERSONAL CARE ITEMS) ×3 IMPLANT
PADDING CAST COTTON 4X4 STRL (SOFTGOODS) ×2
PLATE ACE 100DEG 7HOLE (Plate) ×3 IMPLANT
PLATE ACE 3.5MM 2HOLE (Plate) ×3 IMPLANT
SCALPEL PROTECTED #15 DISP (BLADE) ×6 IMPLANT
SCREW CORT FT 32X3.5XNONLOCK (Screw) ×4 IMPLANT
SCREW CORTICAL 3.5MM  30MM (Screw) ×1 IMPLANT
SCREW CORTICAL 3.5MM  32MM (Screw) ×2 IMPLANT
SCREW CORTICAL 3.5MM  44MM (Screw) ×1 IMPLANT
SCREW CORTICAL 3.5MM 26MM (Screw) ×3 IMPLANT
SCREW CORTICAL 3.5MM 30MM (Screw) ×2 IMPLANT
SCREW CORTICAL 3.5MM 38MM (Screw) ×3 IMPLANT
SCREW CORTICAL 3.5MM 40MM (Screw) ×6 IMPLANT
SCREW CORTICAL 3.5MM 44MM (Screw) ×2 IMPLANT
SPLINT CAST 1 STEP 5X30 WHT (MISCELLANEOUS) ×3 IMPLANT
SPONGE LAP 18X18 RF (DISPOSABLE) ×3 IMPLANT
STAPLER SKIN PROX 35W (STAPLE) IMPLANT
SUT ETHILON 3-0 FS-10 30 BLK (SUTURE) ×3
SUT MNCRL AB 4-0 PS2 18 (SUTURE) ×6 IMPLANT
SUT VIC AB 0 CT1 36 (SUTURE) ×3 IMPLANT
SUT VIC AB 2-0 SH 27 (SUTURE) ×2
SUT VIC AB 2-0 SH 27XBRD (SUTURE) ×4 IMPLANT
SUT VIC AB 3-0 SH 27 (SUTURE) ×1
SUT VIC AB 3-0 SH 27X BRD (SUTURE) ×2 IMPLANT
SUTURE EHLN 3-0 FS-10 30 BLK (SUTURE) ×2 IMPLANT
SYR 10ML LL (SYRINGE) ×3 IMPLANT
ZIPTIGHT ANKLE SYNODESMOSS FIX (Ankle) ×6 IMPLANT

## 2019-02-08 NOTE — Anesthesia Procedure Notes (Signed)
Procedure Name: Intubation Date/Time: 02/08/2019 1:42 PM Performed by: Kelton Pillar, CRNA Pre-anesthesia Checklist: Patient identified, Emergency Drugs available, Suction available and Patient being monitored Patient Re-evaluated:Patient Re-evaluated prior to induction Oxygen Delivery Method: Circle system utilized Preoxygenation: Pre-oxygenation with 100% oxygen Induction Type: IV induction Ventilation: Mask ventilation without difficulty Laryngoscope Size: McGraph and 3 Grade View: Grade I Tube type: Oral Tube size: 7.0 mm Number of attempts: 1 Airway Equipment and Method: Stylet and Video-laryngoscopy Placement Confirmation: ETT inserted through vocal cords under direct vision,  positive ETCO2,  breath sounds checked- equal and bilateral and CO2 detector Secured at: 21 cm Tube secured with: Tape Dental Injury: Teeth and Oropharynx as per pre-operative assessment

## 2019-02-08 NOTE — Op Note (Signed)
02/08/2019  3:32 PM  PATIENT:  Jasmine Morrow  74 y.o. female  PRE-OPERATIVE DIAGNOSIS:  CLOSED TRIMALLEOLAR FRACTURE RIGHT ANKLE  POST-OPERATIVE DIAGNOSIS:  CLOSED TRIMALLEOLAR FRACTURE RIGHT ANKLE  PROCEDURE:  Procedure(s): OPEN REDUCTION INTERNAL FIXATION (ORIF) ANKLE FRACTURE, post malleolus (Right) SYNDESMOSIS REPAIR (Right)  SURGEON: Laurene Footman, MD  ASSISTANTS: None  ANESTHESIA:   general  EBL:  Total I/O In: 1472.2 [I.V.:1422.2; IV Piggyback:50] Out: 600 [Urine:600]  BLOOD ADMINISTERED:none  DRAINS: none   LOCAL MEDICATIONS USED:  NONE  SPECIMEN:  No Specimen  DISPOSITION OF SPECIMEN:  N/A  COUNTS:  YES  TOURNIQUET:   Total Tourniquet Time Documented: Thigh (Right) - 52 minutes Total: Thigh (Right) - 52 minutes   IMPLANTS: Biomet third tubular plate with 7 screws and 2 hole plate with 2 zip tight syndesmotic repair devices  DICTATION: .Dragon Dictation patient was brought to the operating room and after adequate anesthesia was obtained the patient was placed prone.  Propria it positioning and checking for any pressure on extremities was done with care to make sure the patient was stable on the table.  C arm was brought in and the leg was scanned with the C arm showing as expected a mid shaft fibula fracture consistent with a Maisonneuve injury consistent with syndesmosis rupture.  The leg was then prepped and draped in the usual sterile fashion.  Inspection of the skin revealed extensive blistering along the medial aspect of the ankle and subsequently the medial malleolus was not repaired because of this.  A posterior lateral approach was made to the ankle with skin incision between the Achilles tendon and peroneal tendons, sural nerve protected.  The deep fascia was incised over the FHL and it was retracted and elevated off the posterior aspect of the tibia.  The fracture fragment was identified with hematoma evacuated out from behind it using a dental pick and  getting anatomic reduction using the dental pick a K wire was used to provisionally fix it.  A third tubular plate was contoured to allow for extra compression distally with 1 screw placed proximally in the shaft and then 2 screws leg through this to allow for further compression of the fracture site.  This gave good stability on stress views on the lateral view with essentially anatomic alignment.  Additional screw was placed in the distal fragment to the anterior cortex through an onlay technique and then the 3 proximal cortical screw holes were filled using standard technique using 3 5 cortical screws drilling measuring and placing the screws.  At this point AP lateral imaging was saved with just this implant and on stress views there was opening of the syndesmosis.  Dissection was carried out easily just anterior and lateral to the peroneal tendons to expose the fibula at the appropriate level with a 2 hole plate applied to zip tight anchors were placed drilling passing the locking device along to the medial side and then cinching this down this gave good stability to the syndesmosis on stress views.  With the medial malleolus was anterior medial fragment it is slightly displaced but with this condition of the skin anteriorly and medially it was felt that it was advisable to stop at this point and allow for soft tissues to heal.  The wound was thoroughly irrigated and tourniquet let down.  There is no significant bleeding.  3-0 Vicryl was used subcutaneously followed by skin staples Xeroform over the incision as well as over the blisters medially followed by 4  x 4's ABD web roll and a stirrup splint followed by an Ace wrap  PLAN OF CARE: Continue as inpatient  PATIENT DISPOSITION:  PACU - hemodynamically stable.

## 2019-02-08 NOTE — Progress Notes (Signed)
Consent signed by patient and this writer witness, placed in medical chart , surgical consent

## 2019-02-08 NOTE — Anesthesia Preprocedure Evaluation (Signed)
Anesthesia Evaluation  Patient identified by MRN, date of birth, ID band Patient awake    Reviewed: Allergy & Precautions, NPO status , Patient's Chart, lab work & pertinent test results  Airway Mallampati: III       Dental   Pulmonary asthma , sleep apnea , former smoker,    Pulmonary exam normal        Cardiovascular hypertension, Normal cardiovascular exam     Neuro/Psych negative neurological ROS  negative psych ROS   GI/Hepatic negative GI ROS, Neg liver ROS,   Endo/Other  negative endocrine ROS  Renal/GU negative Renal ROS  negative genitourinary   Musculoskeletal   Abdominal Normal abdominal exam  (+)   Peds negative pediatric ROS (+)  Hematology negative hematology ROS (+)   Anesthesia Other Findings Past Medical History: No date: Hypertension No date: Seasonal allergies  Reproductive/Obstetrics                             Anesthesia Physical Anesthesia Plan  ASA: III  Anesthesia Plan: General   Post-op Pain Management:    Induction: Intravenous  PONV Risk Score and Plan:   Airway Management Planned: Oral ETT  Additional Equipment:   Intra-op Plan:   Post-operative Plan: Extubation in OR  Informed Consent: I have reviewed the patients History and Physical, chart, labs and discussed the procedure including the risks, benefits and alternatives for the proposed anesthesia with the patient or authorized representative who has indicated his/her understanding and acceptance.     Dental advisory given  Plan Discussed with: CRNA and Surgeon  Anesthesia Plan Comments:         Anesthesia Quick Evaluation

## 2019-02-08 NOTE — Anesthesia Postprocedure Evaluation (Signed)
Anesthesia Post Note  Patient: Jasmine Morrow  Procedure(s) Performed: OPEN REDUCTION INTERNAL FIXATION (ORIF) ANKLE FRACTURE, post malleolus (Right Ankle) SYNDESMOSIS REPAIR (Right Leg Lower)  Patient location during evaluation: PACU Anesthesia Type: General Level of consciousness: awake and alert Pain management: pain level controlled Vital Signs Assessment: post-procedure vital signs reviewed and stable Respiratory status: spontaneous breathing, nonlabored ventilation and respiratory function stable Cardiovascular status: blood pressure returned to baseline and stable Postop Assessment: no apparent nausea or vomiting Anesthetic complications: no     Last Vitals:  Vitals:   02/08/19 1649 02/08/19 1731  BP: (!) 118/55 (!) 136/41  Pulse: 72 74  Resp: 14   Temp:  36.7 C  SpO2: 94%     Last Pain:  Vitals:   02/08/19 1731  TempSrc: Axillary  PainSc:                  Tera Mater

## 2019-02-08 NOTE — Progress Notes (Signed)
Returned from [place] , c/o pain 5/10 .  Unable to get o2 sat patients hands too cold . Will reattempt

## 2019-02-08 NOTE — Transfer of Care (Signed)
Immediate Anesthesia Transfer of Care Note  Patient: Jasmine Morrow  Procedure(s) Performed: OPEN REDUCTION INTERNAL FIXATION (ORIF) ANKLE FRACTURE, post malleolus (Right Ankle) SYNDESMOSIS REPAIR (Right Leg Lower)  Patient Location: PACU  Anesthesia Type:General  Level of Consciousness: sedated  Airway & Oxygen Therapy: Patient Spontanous Breathing and Patient connected to face mask oxygen  Post-op Assessment: Report given to RN and Post -op Vital signs reviewed and stable  Post vital signs: Reviewed and stable  Last Vitals:  Vitals Value Taken Time  BP 137/67 02/08/19 1532  Temp    Pulse 93 02/08/19 1535  Resp 19 02/08/19 1535  SpO2 98 % 02/08/19 1535  Vitals shown include unvalidated device data.  Last Pain:  Vitals:   02/08/19 1248  TempSrc: Oral  PainSc: 10-Worst pain ever      Patients Stated Pain Goal: 0 ([id number])  Complications: No apparent anesthesia complications

## 2019-02-09 NOTE — Progress Notes (Signed)
   Subjective: 1 Day Post-Op Procedure(s) (LRB): OPEN REDUCTION INTERNAL FIXATION (ORIF) ANKLE FRACTURE, post malleolus (Right) SYNDESMOSIS REPAIR (Right) Patient reports pain as 5 on 0-10 scale.  Patient is complaining of pain in her right postoperative ankle as well as her back from her shoulder blade down to her lumbar spine.  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 Patient is well, and has had no acute complaints or problems Denies any CP, SOB, ABD pain. We will start physical therapy today. Plan is to go Skilled nursing facility after hospital stay.  Patient unable to care for herself at home nor with assistance of her husband due to ankle surgery and weightbearing status.  Objective: Vital signs in last 24 hours: Temp:  [97.4 F (36.3 C)-100.2 F (37.9 C)] 97.7 F (36.5 C) (01/09 0752) Pulse Rate:  [66-94] 66 (01/09 0752) Resp:  [14-20] 17 (01/09 0752) BP: (103-139)/(41-75) 125/61 (01/09 0752) SpO2:  [89 %-100 %] 95 % (01/09 0752) Weight:  [138.3 kg] 138.3 kg (01/08 1248)  Intake/Output from previous day: 01/08 0701 - 01/09 0700 In: 2802.2 [P.O.:50; I.V.:2502.2; IV Piggyback:250] Out: 2320 [Urine:2300; Blood:20] Intake/Output this shift: No intake/output data recorded.  Recent Labs    02/07/19 1550 02/08/19 1807  HGB 11.8* 11.5*   Recent Labs    02/07/19 1550 02/08/19 1807  WBC 10.6* 11.8*  RBC 4.45 4.35  HCT 36.3 35.8*  PLT 169 192   Recent Labs    02/07/19 1550 02/08/19 1807  NA 137  --   K 3.3*  --   CL 100  --   CO2 24  --   BUN 50*  --   CREATININE 2.51* 1.50*  GLUCOSE 119*  --   CALCIUM 9.8  --    No results for input(s): LABPT, INR in the last 72 hours.  EXAM General - Patient is Alert, Appropriate and Oriented Extremity - Neurovascular intact Sensation intact distally Intact pulses distally No cellulitis present Compartment soft  Splint intact right lower extremity Dressing -  dressing C/D/I and no drainage Motor Function - intact, moving toes well on exam.   Past Medical History:  Diagnosis Date  . Hypertension   . Seasonal allergies     Assessment/Plan:   1 Day Post-Op Procedure(s) (LRB): OPEN REDUCTION INTERNAL FIXATION (ORIF) ANKLE FRACTURE, post malleolus (Right) SYNDESMOSIS REPAIR (Right) Active Problems:   Trimalleolar fracture of ankle, closed, right, initial encounter   Acute on chronic low back pain Estimated body mass index is 46.38 kg/m as calculated from the following:   Height as of this encounter: 5\' 8"  (1.727 m).   Weight as of this encounter: 138.3 kg. Advance diet Up with therapy, TTWB RLE Continue with current pain regimen Care management to assist with discharge.  Patient will need to be discharged to skilled nursing facility    DVT Prophylaxis - Lovenox Toe-touch weightbearing right lower extremity   T. Rachelle Hora, PA-C [place] [facility] 02/09/2019, 8:57 AM

## 2019-02-09 NOTE — Evaluation (Signed)
Physical Therapy Evaluation Patient Details Name: Jasmine Morrow MRN: [id number] DOB: May 20, 1945 Today's Date: 02/09/2019   History of Present Illness  Pt admitted for R trimalleolar fx and is now s/p ORIF on 02/08/19. History includes HTN. Pt complains of falling while trying to get into a car and fell on her R side and hit her head.  Clinical Impression  Pt is a pleasant 74 year old female who was admitted for R trimalleolar fx and is now s/p ORIF. Pt performs bed mobility with max/total assist for rolling. Unable to further attempt mobility due to decreased strength. Pt demonstrates deficits with strength/fatigue/cogntion. Pt is not at baseline level at this time and is very limited due to restrictive WBing status. Would benefit from skilled PT to address above deficits and promote optimal return to PLOF; recommend transition to STR upon discharge from acute hospitalization.     Follow Up Recommendations SNF    Equipment Recommendations  (TBD at next venue)    Recommendations for Other Services       Precautions / Restrictions Precautions Precautions: Fall Restrictions Weight Bearing Restrictions: Yes RLE Weight Bearing: Touchdown weight bearing      Mobility  Bed Mobility Overal bed mobility: Needs Assistance Bed Mobility: Rolling Rolling: Max assist;Total assist         General bed mobility comments: attempted rolling. Unable to roll on side despite max assist. Would need additional assist secondary to impairments. Of note, bed sheets wet as purewick malfunctioning. CNA notified.  Transfers                 General transfer comment: unable  Ambulation/Gait                Stairs            Wheelchair Mobility    Modified Rankin (Stroke Patients Only)       Balance Overall balance assessment: Needs assistance;History of Falls                                           Pertinent Vitals/Pain Pain Assessment: Faces Faces  Pain Scale: Hurts little more Pain Location: R ankle and back Pain Descriptors / Indicators: Operative site guarding Pain Intervention(s): Limited activity within patient's tolerance    Home Living Family/patient expects to be discharged to:: Private residence Living Arrangements: Spouse/significant other Available Help at Discharge: Family;Available 24 hours/day Type of Home: House Home Access: Stairs to enter Entrance Stairs-Rails: None Entrance Stairs-Number of Steps: 1 Home Layout: One level Home Equipment: Walker - 4 wheels      Prior Function Level of Independence: Needs assistance         Comments: Pt reports she was very active prior to admission, working out at [employer] and using the treadmill. She reports no other falls previously, wasn't using an AD     Hand Dominance        Extremity/Trunk Assessment   Upper Extremity Assessment Upper Extremity Assessment: Generalized weakness(has difficulty reaching for hand rails; grossly 3/5)    Lower Extremity Assessment Lower Extremity Assessment: Generalized weakness(R LE grossly 2/5; L LE grossly 2+/5)       Communication   Communication: No difficulties  Cognition Arousal/Alertness: Awake/alert Behavior During Therapy: WFL for tasks assessed/performed Overall Cognitive Status: Impaired/Different from baseline  General Comments: still remains somewhat confused to situation, slow to respond and is poor historian      General Comments      Exercises Other Exercises Other Exercises: supine ther-ex performed on L LE including AP, SLRs, hip abd/add, and knee flexion. All ther-ex performed x 10 reps with max assist. Educated on continued HEP for strengthing and wrote on board   Assessment/Plan    PT Assessment Patient needs continued PT services  PT Problem List Decreased strength;Decreased range of motion;Decreased activity tolerance;Decreased  balance;Decreased mobility;Obesity;Pain       PT Treatment Interventions DME instruction;Gait training;Therapeutic activities;Therapeutic exercise;Balance training    PT Goals (Current goals can be found in the Care Plan section)  Acute Rehab PT Goals Patient Stated Goal: to go home PT Goal Formulation: With patient Time For Goal Achievement: 02/23/19 Potential to Achieve Goals: Good    Frequency BID   Barriers to discharge Inaccessible home environment;Decreased caregiver support      Co-evaluation               AM-PAC PT "6 Clicks" Mobility  Outcome Measure Help needed turning from your back to your side while in a flat bed without using bedrails?: Total Help needed moving from lying on your back to sitting on the side of a flat bed without using bedrails?: Total Help needed moving to and from a bed to a chair (including a wheelchair)?: Total Help needed standing up from a chair using your arms (e.g., wheelchair or bedside chair)?: Total Help needed to walk in hospital room?: Total Help needed climbing 3-5 steps with a railing? : Total 6 Click Score: 6    End of Session   Activity Tolerance: Patient limited by fatigue Patient left: in bed;with bed alarm set Nurse Communication: Mobility status PT Visit Diagnosis: Muscle weakness (generalized) (M62.81);History of falling (Z91.81);Difficulty in walking, not elsewhere classified (R26.2);Pain Pain - Right/Left: Right Pain - part of body: Ankle and joints of foot    Time: 1345-1413 PT Time Calculation (min) (ACUTE ONLY): 28 min   Charges:   PT Evaluation $PT Eval Moderate Complexity: 1 Mod PT Treatments $Therapeutic Exercise: 8-22 mins        Jasmine Morrow, PT, DPT [phone]   Jasmine Morrow 02/09/2019, 2:49 PM

## 2019-02-09 NOTE — Progress Notes (Signed)
PT Cancellation Note  Patient Details Name: Jasmine Morrow MRN: [id number] DOB: 30-Jul-1945   Cancelled Treatment:    Reason Eval/Treat Not Completed: Other (comment). Consult received and chart reviewed. Evaluation attempted x 2 this date. First attempt pt refused due to pain meds. 2nd attempt, pt very groggy, unable to give accurate history and unable to participate in there-ex, keeping R LE in ER while supine in bed. Will attempt 1 further time this date for evaluation.   Rickey Farrier 02/09/2019, 9:49 AM Greggory Stallion, PT, DPT [phone]

## 2019-02-10 MED ORDER — TRAMADOL HCL 50 MG PO TABS: 50.0000 mg | ORAL_TABLET | Freq: Four times a day (QID) | ORAL | 0 refills | Status: DC | PRN

## 2019-02-10 MED ORDER — ENOXAPARIN SODIUM 40 MG/0.4ML ~~LOC~~ SOLN: 40.0000 mg | SUBCUTANEOUS | 0 refills | Status: DC

## 2019-02-10 MED ORDER — BISACODYL 10 MG RE SUPP: 10.0000 mg | Freq: Every day | RECTAL | 0 refills | Status: DC | PRN

## 2019-02-10 MED ORDER — SODIUM CHLORIDE 0.9% FLUSH: 3.0000 mL | INTRAVENOUS | Status: DC | PRN

## 2019-02-10 MED ORDER — TRAMADOL HCL 50 MG PO TABS
50.0000 mg | ORAL_TABLET | Freq: Four times a day (QID) | ORAL | Status: DC | PRN
  Administered 2019-02-10 (×2): 100 mg via ORAL
  Filled 2019-02-10 (×2): qty 2

## 2019-02-10 MED ORDER — SODIUM CHLORIDE 0.9% FLUSH
3.0000 mL | Freq: Two times a day (BID) | INTRAVENOUS | Status: DC
  Administered 2019-02-10 – 2019-02-14 (×5): 3 mL via INTRAVENOUS

## 2019-02-10 NOTE — Discharge Instructions (Signed)
Diet: As you were doing prior to hospitalization   Shower:  May shower but keep the wounds dry, use an occlusive plastic wrap, NO SOAKING IN TUB.  If the bandage gets wet, change with a clean dry gauze.  Dressing: Keep splint clean and dry.  Activity:  Increase activity slowly as tolerated, but follow the weight bearing instructions below.  No lifting or driving for 6 weeks.  Weight Bearing: Toe-touch weightbearing right lower extremity.  To prevent constipation: you may use a stool softener such as -  Colace (over the counter) 100 mg by mouth twice a day  Drink plenty of fluids (prune juice may be helpful) and high fiber foods Miralax (over the counter) for constipation as needed.    Itching:  If you experience itching with your medications, try taking only a single pain pill, or even half a pain pill at a time.  You may take up to 10 pain pills per day, and you can also use benadryl over the counter for itching or also to help with sleep.   Precautions:  If you experience chest pain or shortness of breath - call 911 immediately for transfer to the hospital emergency department!!  If you develop a fever greater that 101 F, purulent drainage from wound, increased redness or drainage from wound, or calf pain-Call [facility]                                               Follow- Up Appointment:  Please call for an appointment to be seen in 2 weeks at [facility]

## 2019-02-10 NOTE — Progress Notes (Signed)
Physical Therapy Treatment Patient Details Name: Jasmine Morrow MRN: [id number] DOB: 1945/09/18 Today's Date: 02/10/2019    History of Present Illness Pt admitted for R trimalleolar fx and is now s/p ORIF on 02/08/19. History includes HTN. Pt complains of falling while trying to get into a car and fell on her R side and hit her head.    PT Comments    Pt ready for session but hesitant for LLE ex.  She is able to tolerate ex with encouragement and guidance.  Initially very hesitant and only allows minimal ranges but with time she puts in more effort and allows for an increased range.  She continues to posture both LE's in external rotation and education and time were taken to bring both legs back to neutral and position LE upon end of session.  She is encouraged to work on neutral positioning.  She is motivated and wants to try a second set of exercises but fatigues after 10 reps and stated she does not feel like she can do any more at this time.  She is able to reposition herself in bed with HOB lowered in boost position using LLE and pulling on headboard with verbal cues only.  Further bed mobility is deferred at this time due to fatigue.  Anticipate it will take 3+ people for sitting and will need to coordinate care for attempts due to limited mobility, pain and fear.   Follow Up Recommendations  SNF     Equipment Recommendations       Recommendations for Other Services       Precautions / Restrictions Precautions Precautions: Fall Restrictions Weight Bearing Restrictions: Yes RLE Weight Bearing: Touchdown weight bearing    Mobility  Bed Mobility Overal bed mobility: Needs Assistance Bed Mobility: Rolling Rolling: Max assist;Total assist         General bed mobility comments: is able to scoot up in bed in supine trendelenburg using LLE and pulling on headboard with verbal cues only  Transfers                 General transfer comment: unable  Ambulation/Gait                  Stairs             Wheelchair Mobility    Modified Rankin (Stroke Patients Only)       Balance                                            Cognition Arousal/Alertness: Awake/alert Behavior During Therapy: WFL for tasks assessed/performed Overall Cognitive Status: Within Functional Limits for tasks assessed                                        Exercises Other Exercises Other Exercises: supine x 10 - toe wiggles, ankle pups (L) heel slides, ab/add and slr - she is able to tolerate second set of SLR but is too fatigued to continue after.    General Comments        Pertinent Vitals/Pain Pain Assessment: Faces Faces Pain Scale: Hurts even more Pain Location: R ankle and back Pain Descriptors / Indicators: Operative site guarding Pain Intervention(s): Monitored during session;Repositioned;Limited activity within patient's tolerance    Home Living  Prior Function            PT Goals (current goals can now be found in the care plan section) Progress towards PT goals: Progressing toward goals    Frequency    BID      PT Plan Current plan remains appropriate    Co-evaluation              AM-PAC PT "6 Clicks" Mobility   Outcome Measure  Help needed turning from your back to your side while in a flat bed without using bedrails?: Total Help needed moving from lying on your back to sitting on the side of a flat bed without using bedrails?: Total Help needed moving to and from a bed to a chair (including a wheelchair)?: Total Help needed standing up from a chair using your arms (e.g., wheelchair or bedside chair)?: Total Help needed to walk in hospital room?: Total Help needed climbing 3-5 steps with a railing? : Total 6 Click Score: 6    End of Session   Activity Tolerance: Patient limited by fatigue;Patient limited by pain Patient left: in bed;with bed alarm  set;with call bell/phone within reach Nurse Communication: Mobility status Pain - Right/Left: Right Pain - part of body: Ankle and joints of foot     Time: [id number] PT Time Calculation (min) (ACUTE ONLY): 23 min  Charges:  $Therapeutic Exercise: 23-37 mins                     Chesley Noon, PTA 02/10/19, 9:57 AM

## 2019-02-10 NOTE — Progress Notes (Signed)
Pt pain control improved with tramadol per pt report and tolerated well. Post op dsg dry/intact. Son in to visit. Bisocodyl tablet given for constipation- will monitor for response.

## 2019-02-10 NOTE — Discharge Summary (Addendum)
Physician Discharge Summary  Patient ID: Jasmine Morrow MRN: [id number] DOB/AGE: December 14, 1945 74 y.o.  Admit date: 02/07/2019 Discharge date: 02/14/2019 Admission Diagnoses:  Closed fracture of right ankle, initial encounter [S82.891A] Trimalleolar fracture of ankle, closed, right, initial encounter [S82.851A]   Discharge Diagnoses: Patient Active Problem List   Diagnosis Date Noted  . Trimalleolar fracture of ankle, closed, right, initial encounter 02/07/2019  . Multiple lung nodules 03/31/2014  . 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 OSA on CPAP 03/27/2014    Past Medical History:  Diagnosis Date  . Hypertension   . Seasonal allergies      Transfusion: none   Consultants (if any):   Discharged Condition: Improved  Hospital Course: Jasmine Morrow is an 74 y.o. female who was admitted 02/07/2019 with a diagnosis of right ankle fracture dislocation and went to the operating room on 02/08/2019 and underwent the above named procedures.    Surgeries: Procedure(s): OPEN REDUCTION INTERNAL FIXATION (ORIF) ANKLE FRACTURE, post malleolus SYNDESMOSIS REPAIR on 02/08/2019 Patient tolerated the surgery well. Taken to PACU where she was stabilized and then transferred to the orthopedic floor.  Started on Lovenox 40 mg q 24 hrs. No evidence of DVT. Negative Homan. Physical therapy started on day #1 for gait training and transfer.  Patient's foley was d/c on day #1. Patient's IV  was d/c on day #2.  On postop day 4 patient noted to have a little bit of confusion.  Most likely this was due to not wearing her CPAP at night.  We did order a urinalysis which was negative and a chest x-ray which showed some atelectasis and could not rule out a left lower lobe infiltrate.  She was started on azithromycin.  Vital signs stable.  No chest pain shortness of breath.  Patient was encouraged to use her CPAP and did overnight.  On post op day #5 patient with no confusion.  Felt well.  Felt like she had a lot of  improvement with CPAP.  On postop day 6 patient is doing well.  No complaints of pain.  Tolerating p.o. well.  Bowels moving well.  She does complain of starting a yeast infection last night.  She is requesting fluconazole.  Fluconazole ordered.  Patient stable and ready for discharge to skilled nursing facility. SNF unable to support CPAP, 4 liters of oxygen via nasal cannula to be administered at night time.  Implants:  Biomet third tubular plate with 7 screws and 2 hole plate with 2 zip tight syndesmotic repair devices  She was given perioperative antibiotics:  Anti-infectives (From admission, onward)   Start     Dose/Rate Route Frequency Ordered Stop   02/14/19 0800  fluconazole (DIFLUCAN) tablet 100 mg     100 mg Oral  Once 02/14/19 0752     02/13/19 0800  azithromycin (ZITHROMAX) tablet 500 mg     500 mg Oral  Once 02/13/19 0752 02/13/19 0924   02/13/19 0000  azithromycin (ZITHROMAX) 250 MG tablet     250 mg Oral Daily 02/13/19 0807 02/18/19 2359   02/09/19 0600  ceFAZolin (ANCEF) IVPB 2g/100 mL premix  Status:  Discontinued     2 g 200 mL/hr over 30 Minutes Intravenous On call to O.R. 02/08/19 1906 02/10/19 0559   02/08/19 1930  ceFAZolin (ANCEF) IVPB 2g/100 mL premix     2 g 200 mL/hr over 30 Minutes Intravenous Every 6 hours 02/08/19 1741 02/10/19 0700   02/08/19 1600  ceFAZolin (ANCEF) 3 g in dextrose 5 %  50 mL IVPB     3 g 100 mL/hr over 30 Minutes Intravenous  Once 02/08/19 0815 02/09/19 0459    .  She was given sequential compression devices, early ambulation, and aspirin for DVT prophylaxis.  She benefited maximally from the hospital stay and there were no complications.    Recent vital signs:  Vitals:   02/14/19 0020 02/14/19 0739  BP: 131/77 (!) 143/75  Pulse: 80 78  Resp: 17 18  Temp: 98.6 F (37 C) (!) 97.5 F (36.4 C)  SpO2: 96% 100%    Recent laboratory studies:  Lab Results  Component Value Date   HGB 11.5 (L) 02/08/2019   HGB 11.8 (L) 02/07/2019    Lab Results  Component Value Date   WBC 11.8 (H) 02/08/2019   PLT 192 02/08/2019   No results found for: INR Lab Results  Component Value Date   NA 137 02/07/2019   K 3.3 (L) 02/07/2019   CL 100 02/07/2019   CO2 24 02/07/2019   BUN 50 (H) 02/07/2019   CREATININE 1.14 (H) 02/13/2019   GLUCOSE 119 (H) 02/07/2019    Discharge Medications:   Allergies as of 02/14/2019      Reactions   Celecoxib Nausea Only   Hydrocodone-acetaminophen Nausea And Vomiting   Meloxicam Nausea Only   Morphine Sulfate Er Beads Itching   Naproxen Swelling   Rofecoxib Nausea Only   Sulfa Antibiotics Other (See Comments)   Reaction: unknown   Ciprofloxacin Rash   Nickel Rash   Tramadol-acetaminophen Nausea Only, Rash      Medication List    STOP taking these medications   ibuprofen 600 MG tablet Commonly known as: ADVIL   oxyCODONE-acetaminophen 7.5-325 MG tablet Commonly known as: Percocet     TAKE these medications   acetaminophen 500 MG tablet Commonly known as: TYLENOL Take 1,000 mg by mouth every 6 (six) hours as needed for mild pain or headache.   albuterol 108 (90 Base) MCG/ACT inhaler Commonly known as: VENTOLIN HFA Inhale 2 puffs into the lungs every 4 (four) hours as needed. What changed: reasons to take this   aspirin EC 325 MG tablet Take 1 tablet (325 mg total) by mouth daily. What changed:   medication strength  how much to take   azithromycin 250 MG tablet Commonly known as: ZITHROMAX Take 1 tablet (250 mg total) by mouth daily for 5 days.   bisacodyl 10 MG suppository Commonly known as: DULCOLAX Place 1 suppository (10 mg total) rectally daily as needed for moderate constipation.   Calcium-Vitamin D 600-200 MG-UNIT tablet Take 2 tablets by mouth daily.   carvedilol 3.125 MG tablet Commonly known as: COREG Take 3.125 mg by mouth 2 (two) times daily.   Cetirizine HCl 10 MG Caps Take 10 mg by mouth daily.   cyclobenzaprine 10 MG tablet Commonly known  as: FLEXERIL Take 10 mg by mouth daily as needed for muscle spasms.   fluocinonide gel 0.05 % Commonly known as: LIDEX Apply 1 application topically 3 (three) times daily as needed. Apply to mouth lesions as needed   hydroxypropyl methylcellulose / hypromellose 2.5 % ophthalmic solution Commonly known as: ISOPTO TEARS / GONIOVISC Place 1 drop into both eyes 4 (four) times daily as needed for dry eyes.   multivitamin with minerals Tabs tablet Take 1 tablet by mouth daily.   NONFORMULARY OR COMPOUNDED ITEM Apply 1 application topically See admin instructions. Compounded cream containing estradiol. Applies vaginally twice a week.   omeprazole 20 MG  capsule Commonly known as: PRILOSEC Take 20 mg by mouth 2 (two) times daily as needed (Heartburn).   ondansetron 8 MG tablet Commonly known as: Zofran Take 1 tablet (8 mg total) by mouth every 8 (eight) hours as needed for nausea or vomiting.   PRESERVISION AREDS 2 PO Take 1 tablet by mouth 2 (two) times daily.   traMADol 50 MG tablet Commonly known as: ULTRAM Take 1-2 tablets (50-100 mg total) by mouth every 6 (six) hours as needed for severe pain.   triamterene-hydrochlorothiazide 37.5-25 MG tablet Commonly known as: MAXZIDE-25 Take 1 tablet by mouth daily.       Diagnostic Studies: DG Chest 2 View  Result Date: 02/12/2019 CLINICAL DATA:  Cough. EXAM: CHEST - 2 VIEW COMPARISON:  02/07/2019. FINDINGS: Mediastinum hilar structures normal. Heart size stable. Mild bibasilar subsegmental atelectasis and or scarring. Mild left base infiltrate cannot be excluded. No pleural effusion or pneumothorax. IMPRESSION: Mild bibasilar subsegmental atelectasis and or scarring. Mild left base infiltrate cannot be excluded. Electronically Signed   By: Marcello Moores  Register   On: 02/12/2019 10:15   DG Thoracic Spine 2 View  Result Date: 02/11/2019 CLINICAL DATA:  Back pain EXAM: THORACIC SPINE 2 VIEWS COMPARISON:  None. FINDINGS: Vertebral body heights  and alignment are maintained. Mild degenerative changes are present. Included lungs are clear. Normal heart size. IMPRESSION: Mild degenerative changes.  No compression deformity identified. Electronically Signed   By: Macy Mis M.D.   On: 02/11/2019 08:36   DG Lumbar Spine 2-3 Views  Result Date: 02/06/2019 CLINICAL DATA:  Low back pain and right hip pain secondary to a fall in her yard this morning. EXAM: LUMBAR SPINE - 2-3 VIEW COMPARISON:  None. FINDINGS: There is no fracture or bone destruction. Grade 1 spondylolisthesis of L4 on L5 and of L5 on S1 felt to be secondary to severe bilateral facet arthritis. Chronic degenerative disc disease at L1-2 and L2-3 with disc space narrowing and posterior osteophytes protruding into the spinal canal at each of those levels. Minimal lumbar scoliosis with convexity to the right centered at L2-3. IMPRESSION: 1. No acute abnormality. 2. 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 3. Severe degenerative disc disease at L1-2 and L2-3. Electronically Signed   By: Lorriane Shire M.D.   On: 02/06/2019 12:39   DG Ankle 2 Views Right  Result Date: 02/08/2019 CLINICAL DATA:  ORIF right ankle fracture EXAM: DG C-ARM 1-60 MIN; RIGHT ANKLE - 2 VIEW COMPARISON:  02/06/2019 right ankle radiographs FLUOROSCOPY TIME:  Fluoroscopy Time:  1 minutes 45 seconds Number of Acquired Spot Images: 4 FINDINGS: Nondiagnostic spot fluoroscopic intraoperative right ankle radiographs demonstrate transfixation of posterior malleolar fracture with surgical plate and multiple interlocking screws. IMPRESSION: Intraoperative fluoroscopic guidance for ORIF right ankle fracture. Electronically Signed   By: Ilona Sorrel M.D.   On: 02/08/2019 15:30   DG Ankle Complete Right  Result Date: 02/06/2019 CLINICAL DATA:  Right ankle pain secondary to a fall in the patient's yard. EXAM: RIGHT ANKLE - COMPLETE 3+ VIEW COMPARISON:  None. FINDINGS: There  is a fracture dislocation at the right ankle. There are displaced fractures of the medial and lateral malleoli. There is disruption of the distal tibiofibular syndesmosis with dislocation of the foot posteriorly with respect to the tibia. Minimal osteophyte formation on the dorsum of the midfoot. IMPRESSION: 1. Fracture dislocation of the right ankle. 2. Medial and lateral malleolar fractures with disruption of the distal tibiofibular syndesmosis.  Electronically Signed   By: Lorriane Shire M.D.   On: 02/06/2019 10:16   CT Ankle Right Wo Contrast  Result Date: 02/06/2019 CLINICAL DATA:  Ankle trauma, slipped and fell in the yard. EXAM: CT OF THE RIGHT ANKLE WITHOUT CONTRAST TECHNIQUE: Multidetector CT imaging of the right ankle was performed according to the standard protocol. Multiplanar CT image reconstructions were also generated. COMPARISON:  None. FINDINGS: Bones/Joint/Cartilage Acute comminuted fracture of the distal posterior tibial malleolus with 9 mm of distraction at the articular surface. Comminuted and displaced fracture of the medial malleolus. Comminuted fracture of the anterolateral corner of tibial plafond. Posterior subluxation of the talus relative to the tibia. Subchondral cystic changes in the medial and lateral corners of the talar dome. Well corticated bony fragments at the tip of the medial malleolus consistent with sequela prior avulsive injury. Mild osteoarthritis of the subtalar joints. Mild osteoarthritis of the talonavicular joint. Mild osteoarthritis of the navicular-cuneiform joints. Mild osteoarthritis of the second and third tarsometatarsal joints. Small plantar calcaneal spur.  No aggressive osseous lesion. Ligaments Ligaments are suboptimally evaluated by CT. Muscles and Tendons Muscles are normal. No muscle atrophy. Flexor, extensor, peroneal and Achilles tendons are intact. Posterior tibial tendon courses immediately lateral to the major fracture fragment of the posterior  malleolus of the distal tibia. Soft tissue No fluid collection or hematoma.  No soft tissue mass. IMPRESSION: 1. Acute comminuted fracture of the distal posterior tibial malleolus with 9 mm of distraction at the articular surface. Comminuted and displaced fracture of the medial malleolus. Comminuted fracture of the anterolateral corner of tibial plafond. Posterior subluxation of the talus relative to the tibia. Electronically Signed   By: Kathreen Devoid   On: 02/06/2019 13:13   DG Chest Portable 1 View  Result Date: 02/07/2019 CLINICAL DATA:  Hypertension, ankle fracture EXAM: PORTABLE CHEST 1 VIEW COMPARISON:  09/25/2007, 08/17/2015 FINDINGS: The heart size and mediastinal contours are within normal limits. Streaky bibasilar opacities. Linear scarring in the left hilar region. No pleural effusion or pneumothorax. The visualized skeletal structures are unremarkable. IMPRESSION: Streaky bibasilar opacities which could reflect atelectasis or infection. Electronically Signed   By: Davina Poke D.O.   On: 02/07/2019 15:34   DG Ankle Right Port  Result Date: 02/08/2019 CLINICAL DATA:  Status post ORIF of ankle fracture, initial encounter EXAM: PORTABLE RIGHT ANKLE - 2 VIEW COMPARISON:  Intraoperative films from earlier in the same day. FINDINGS: Fixation sideplate is noted along the posterior aspect of the distal tibia. Fixation device bridging the tibia and fibula in the metaphysis is seen. Medial malleolar fracture is again noted and mildly displaced. IMPRESSION: Status post ORIF of right ankle fracture. Electronically Signed   By: Inez Catalina M.D.   On: 02/08/2019 16:34   DG C-Arm 1-60 Min  Result Date: 02/08/2019 CLINICAL DATA:  ORIF right ankle fracture EXAM: DG C-ARM 1-60 MIN; RIGHT ANKLE - 2 VIEW COMPARISON:  02/06/2019 right ankle radiographs FLUOROSCOPY TIME:  Fluoroscopy Time:  1 minutes 45 seconds Number of Acquired Spot Images: 4 FINDINGS: Nondiagnostic spot fluoroscopic intraoperative right ankle  radiographs demonstrate transfixation of posterior malleolar fracture with surgical plate and multiple interlocking screws. IMPRESSION: Intraoperative fluoroscopic guidance for ORIF right ankle fracture. Electronically Signed   By: Ilona Sorrel M.D.   On: 02/08/2019 15:30   DG Hip Unilat W or Wo Pelvis 2-3 Views Right  Result Date: 02/06/2019 CLINICAL DATA:  74 year old female with pain after fall in yard. EXAM: DG HIP (WITH OR WITHOUT PELVIS)  2-3V RIGHT COMPARISON:  None. FINDINGS: Femoral heads are normally located. Bone mineralization is within normal limits for age. Intact proximal right femur. Proximal left femur appears grossly intact. No pelvis fracture identified. Degenerative changes at the pubic symphysis. Sacral ala and SI joints appear normal. Advanced degenerative changes in the lower lumbar spine. Negative lower abdominal and pelvic visceral contours. IMPRESSION: No acute fracture or dislocation identified about the right hip or pelvis. Electronically Signed   By: Genevie Ann M.D.   On: 02/06/2019 12:39    Disposition:      Contact information for follow-up providers    Duanne Guess, PA-C Follow up in 2 week(s).   Specialties: Orthopedic Surgery, Emergency Medicine Contact information: [place] [address] [phone]            Contact information for after-discharge care    [facility] .   Service: Skilled Nursing Contact information: [place] [phone]                   Signed: Feliberto Gottron 02/14/2019, 9:01 AM

## 2019-02-10 NOTE — Progress Notes (Signed)
   Subjective: 2 Days Post-Op Procedure(s) (LRB): OPEN REDUCTION INTERNAL FIXATION (ORIF) ANKLE FRACTURE, post malleolus (Right) SYNDESMOSIS REPAIR (Right) Patient reports pain as 5 on 0-10 scale.  Ankle is not causing her much discomfort.  Still having some back pain.  Oxycodone was too strong, felt like she was out of it.  We will cancel oxycodone and try tramadol.   Patient is well, and has had no acute complaints or problems Denies any CP, SOB, ABD pain. We will continue with physical therapy today. Plan is to go Skilled nursing facility after hospital stay.  Patient unable to care for herself at home nor with assistance of her husband due to ankle surgery and weightbearing status.  Objective: Vital signs in last 24 hours: Temp:  [98 F (36.7 C)-98.3 F (36.8 C)] 98 F (36.7 C) (01/10 0740) Pulse Rate:  [72-75] 72 (01/10 0740) Resp:  [16-17] 16 (01/10 0004) BP: (108-131)/(47-66) 108/66 (01/10 0740) SpO2:  [94 %-96 %] 96 % (01/10 0740)  Intake/Output from previous day: 01/09 0701 - 01/10 0700 In: 693.1 [P.O.:240; I.V.:453.1] Out: 800 [Urine:800] Intake/Output this shift: No intake/output data recorded.  Recent Labs    02/07/19 1550 02/08/19 1807  HGB 11.8* 11.5*   Recent Labs    02/07/19 1550 02/08/19 1807  WBC 10.6* 11.8*  RBC 4.45 4.35  HCT 36.3 35.8*  PLT 169 192   Recent Labs    02/07/19 1550 02/08/19 1807  NA 137  --   K 3.3*  --   CL 100  --   CO2 24  --   BUN 50*  --   CREATININE 2.51* 1.50*  GLUCOSE 119*  --   CALCIUM 9.8  --    No results for input(s): LABPT, INR in the last 72 hours.  EXAM General - Patient is Alert, Appropriate and Oriented Extremity - Neurovascular intact Sensation intact distally Intact pulses distally No cellulitis present Compartment soft  Splint intact right lower extremity Dressing - dressing C/D/I and no drainage Motor Function - intact, moving toes well on exam.   Past Medical History:  Diagnosis Date  .  Hypertension   . Seasonal allergies     Assessment/Plan:   2 Days Post-Op Procedure(s) (LRB): OPEN REDUCTION INTERNAL FIXATION (ORIF) ANKLE FRACTURE, post malleolus (Right) SYNDESMOSIS REPAIR (Right) Active Problems:   Trimalleolar fracture of ankle, closed, right, initial encounter   Acute on chronic low back pain Estimated body mass index is 46.38 kg/m as calculated from the following:   Height as of this encounter: 5\' 8"  (1.727 m).   Weight as of this encounter: 138.3 kg. Advance diet Up with therapy, TTWB RLE Discontinue oxycodone, start tramadol as needed Care management to assist with discharge.  Patient will need to be discharged to skilled nursing facility pending authorization for SNF    DVT Prophylaxis - Lovenox Toe-touch weightbearing right lower extremity   T. Rachelle Hora, PA-C [place] 02/10/2019, 9:20 AM

## 2019-02-11 ENCOUNTER — Other Ambulatory Visit: Admission: RE | Admit: 2019-02-11 | Payer: Medicare Other | Source: Ambulatory Visit

## 2019-02-11 ENCOUNTER — Inpatient Hospital Stay: Payer: Medicare Other

## 2019-02-11 MED ORDER — CHLORHEXIDINE GLUCONATE CLOTH 2 % EX PADS
6.0000 | MEDICATED_PAD | Freq: Every day | CUTANEOUS | Status: DC
  Administered 2019-02-11: 6 via TOPICAL

## 2019-02-11 MED ORDER — MUPIROCIN 2 % EX OINT
1.0000 "application " | TOPICAL_OINTMENT | Freq: Two times a day (BID) | CUTANEOUS | Status: DC
  Administered 2019-02-11 – 2019-02-14 (×7): 1 via NASAL
  Filled 2019-02-11: qty 22

## 2019-02-11 NOTE — Progress Notes (Signed)
   Subjective: 3 Days Post-Op Procedure(s) (LRB): OPEN REDUCTION INTERNAL FIXATION (ORIF) ANKLE FRACTURE, post malleolus (Right) SYNDESMOSIS REPAIR (Right) Patient reports pain as 5 on 0-10 scale.  Ankle is not causing her much discomfort.  Still having some thoracic back pain. Tramadol helping, causing mild nausea. Patient is well, and has had no acute complaints or problems Denies any CP, SOB, ABD pain. We will continue with physical therapy today. Plan is to go Skilled nursing facility after hospital stay.  Patient unable to care for herself at home nor with assistance of her husband due to ankle surgery and weightbearing status.  Objective: Vital signs in last 24 hours: Temp:  [97.7 F (36.5 C)-98.3 F (36.8 C)] 97.7 F (36.5 C) (01/11 0726) Pulse Rate:  [69-73] 70 (01/11 0726) Resp:  [16] 16 (01/11 0006) BP: (134-145)/(60-82) 134/82 (01/11 0726) SpO2:  [96 %-97 %] 96 % (01/11 0726)  Intake/Output from previous day: 01/10 0701 - 01/11 0700 In: -  Out: 1300 [Urine:1300] Intake/Output this shift: No intake/output data recorded.  Recent Labs    02/08/19 1807  HGB 11.5*   Recent Labs    02/08/19 1807  WBC 11.8*  RBC 4.35  HCT 35.8*  PLT 192   Recent Labs    02/08/19 1807  CREATININE 1.50*   No results for input(s): LABPT, INR in the last 72 hours.  EXAM General - Patient is Alert, Appropriate and Oriented Extremity - Neurovascular intact Sensation intact distally Intact pulses distally No cellulitis present Compartment soft  Splint intact right lower extremity Dressing - dressing C/D/I and no drainage Motor Function - intact, moving toes well on exam.   Past Medical History:  Diagnosis Date  . Hypertension   . Seasonal allergies     Assessment/Plan:   3 Days Post-Op Procedure(s) (LRB): OPEN REDUCTION INTERNAL FIXATION (ORIF) ANKLE FRACTURE, post malleolus (Right) SYNDESMOSIS REPAIR (Right) Active Problems:   Trimalleolar fracture of ankle,  closed, right, initial encounter   Acute on chronic low back pain Estimated body mass index is 46.38 kg/m as calculated from the following:   Height as of this encounter: 5\' 8"  (1.727 m).   Weight as of this encounter: 138.3 kg. Advance diet Up with therapy, TTWB RLE X-ray thoracic spine -concern for compression fracture. Continue tramadol for pain control. Continue with nausea medication as needed. Care management to assist with discharge.  Patient will need to be discharged to skilled nursing facility pending authorization for SNF    DVT Prophylaxis - Lovenox Toe-touch weightbearing right lower extremity   T. Rachelle Hora, PA-C [place] 02/11/2019, 7:44 AM

## 2019-02-11 NOTE — Progress Notes (Signed)
Physical Therapy Treatment Patient Details Name: Jasmine Morrow MRN: [id number] DOB: [birth year] Today's Date: 02/11/2019    History of Present Illness Jasmine Morrow is a 35yoF who is admitted for R trimalleolar fx and is now s/p ORIF on 02/08/19. History includes HTN. Pt complains of falling while trying to get into a car and fell on her R side and hit her head. Postoperatively pt is RLE TDWB.    PT Comments    Pt in bed upon arrival, agreeable to participate. Pt appears fairly drowsy upon arrival, is able to alert some, but by end of session is falling asleep with SAQ. Pt consistently unable to demonstrate isolated Rt knee extension activation. Pt has some reluctance to engage in full available ROM for many exercises, moreso on the RLE, not clearly related to pain, but pt encouraged to move limbs in greater amplitude, as her RLE abdct is not sufficient to perform bed mobility, and her heel slides hip flexion ROM is not sufficient for sitting posture. Husband arrives mid session, provides additional narrative of HPI, most of which has been gathered from EMR previously. Husband has questions regarding surgical approach, but Pryor Curia is unable to address these.     Follow Up Recommendations  SNF     Equipment Recommendations  (will likely need a W/C based on strength limitations, which husband is agreeable.)    Recommendations for Other Services       Precautions / Restrictions Precautions Precautions: Fall Restrictions RLE Weight Bearing: Touchdown weight bearing    Mobility  Bed Mobility Overal bed mobility: (deferred due to weakness, cogntiive issues, and somnolenc)                Transfers                    Ambulation/Gait                 Stairs             Wheelchair Mobility    Modified Rankin (Stroke Patients Only)       Balance                                            Cognition Arousal/Alertness:  Lethargic Behavior During Therapy: WFL for tasks assessed/performed Overall Cognitive Status: No family/caregiver present to determine baseline cognitive functioning Area of Impairment: Following commands                       Following Commands: Follows one step commands consistently;Follows one step commands inconsistently;Follows one step commands with increased time     Problem Solving: Slow processing;Decreased initiation;Requires verbal cues;Requires tactile cues        Exercises General Exercises - Lower Extremity Ankle Circles/Pumps: AROM;15 reps;Left;AAROM;Supine;Limitations Ankle Circles/Pumps Limitations: manual resistance at Left ankle Gluteal Sets: Left;AROM;AAROM;Supine;10 reps;Limitations Gluteal Sets Limitations: Cues for Left hooklying single leg bridge isometric; difficulty with exerting an effort, but able to do 10x. Short Arc QuadSinclair Ship;Both;Supine;10 reps;Limitations Short Arc Quad Limitations: Pt unable to activate/isolate Rt knee extension, typical tresults in hip flexion with passive and/or active knee flexion Heel Slides: AAROM;Both;15 reps;Supine;Limitations Heel Slides Limitations: not following commands for large amplitude, hence author assist with P/ROM over 75 degrees hip flexion. Explained to patient that a minimum of 90-100degrees is required for sitting at EOB. Hip ABduction/ADduction: AAROM;Both;15 reps;Supine;Limitations  Hip Abduction/Adduction Limitations: cues for increased limb amplitude on Rt without correction.    General Comments        Pertinent Vitals/Pain Pain Assessment: 0-10 Pain Score: 7  Pain Location: R ankle and low back Pain Descriptors / Indicators: Operative site guarding Pain Intervention(s): Limited activity within patient's tolerance;Repositioned;Monitored during session    Home Living                      Prior Function            PT Goals (current goals can now be found in the care plan section)  Acute Rehab PT Goals Patient Stated Goal: to go home PT Goal Formulation: With patient Time For Goal Achievement: 02/23/19 Potential to Achieve Goals: Poor Progress towards PT goals: Progressing toward goals    Frequency    BID      PT Plan Current plan remains appropriate    Co-evaluation              AM-PAC PT "6 Clicks" Mobility   Outcome Measure  Help needed turning from your back to your side while in a flat bed without using bedrails?: Total Help needed moving from lying on your back to sitting on the side of a flat bed without using bedrails?: Total Help needed moving to and from a bed to a chair (including a wheelchair)?: Total Help needed standing up from a chair using your arms (e.g., wheelchair or bedside chair)?: Total Help needed to walk in hospital room?: Total Help needed climbing 3-5 steps with a railing? : Total 6 Click Score: 6    End of Session   Activity Tolerance: Patient limited by fatigue;Patient limited by lethargy Patient left: in bed;with call bell/phone within reach;with nursing/sitter in room;with family/visitor present Nurse Communication: Mobility status PT Visit Diagnosis: Muscle weakness (generalized) (M62.81);History of falling (Z91.81);Difficulty in walking, not elsewhere classified (R26.2);Pain Pain - Right/Left: Right Pain - part of body: Ankle and joints of foot     Time: [id number] PT Time Calculation (min) (ACUTE ONLY): 16 min  Charges:  $Therapeutic Exercise: 8-22 mins                     3:25 PM, 02/11/19 Etta Grandchild, PT, DPT Physical Therapist - [facility]  [phone] ([place])    Laurel C 02/11/2019, 3:20 PM

## 2019-02-11 NOTE — TOC Initial Note (Signed)
Transition of Care [facility]) - Initial/Assessment Note    Patient Details  Name: Jasmine Morrow MRN: [id number] Date of Birth: [birth year]  Transition of [facility]) CM/SW Contact:    Jasmine Chroman, LCSW Phone Number: 02/11/2019, 2:45 PM  Clinical Narrative:  CSW met with patient's husband outside of room, introduced role, and explained that PT recommendations would be discussed. Patient's husband agreeable to SNF placement. [facility] is first preference. CSW explained that they are not currently taking new rehab referrals due to [place]. He has some contacts that may be able to assist in getting her in. If not, [facility] rehab is likely a second preference. CSW spoke with case manager and she stated they have been full a lot lately but CSW could send referral. Also sent referral out to other local facilities as a backup plan. No further concerns. CSW encouraged patient's husband to contact CSW as needed. CSW will continue to follow patient for support and facilitate discharge to SNF once medically stable.                Expected Discharge Plan: Skilled Nursing Facility Barriers to Discharge: Ship broker, Continued Medical Work up   Patient Goals and CMS Choice   CMS Medicare.gov Compare Post Acute Care list provided to:: Patient Represenative (must comment)(Husband)    Expected Discharge Plan and Services Expected Discharge Plan: [place] Choice: [place] arrangements for the past 2 months: Single Family Home                                      Prior Living Arrangements/Services Living arrangements for the past 2 months: Single Family Home Lives with:: Spouse Patient language and need for interpreter reviewed:: Yes Do you feel safe going back to the place where you live?: Yes      Need for Family Participation in Patient Care: Yes (Comment) Care giver support system in place?: Yes  (comment)   Criminal Activity/Legal Involvement Pertinent to Current Situation/Hospitalization: No - Comment as needed  Activities of Daily Living Home Assistive Devices/Equipment: Eyeglasses ADL Screening (condition at time of admission) Patient's cognitive ability adequate to safely complete daily activities?: Yes Is the patient deaf or have difficulty hearing?: No Does the patient have difficulty seeing, even when wearing glasses/contacts?: No Does the patient have difficulty concentrating, remembering, or making decisions?: No Patient able to express need for assistance with ADLs?: Yes Does the patient have difficulty dressing or bathing?: Yes Independently performs ADLs?: Yes (appropriate for developmental age) Does the patient have difficulty walking or climbing stairs?: No Weakness of Legs: Both Weakness of Arms/Hands: None  Permission Sought/Granted Permission sought to share information with : Facility Sport and exercise psychologist    Share Information with NAME: Jasmine Morrow  Permission granted to share info w AGENCY: SNF's  Permission granted to share info w Relationship: Husband  Permission granted to share info w Contact Information: [phone]  Emotional Assessment Appearance:: Appears stated age Attitude/Demeanor/Rapport: Unable to Assess Affect (typically observed): Unable to Assess Orientation: : Oriented to Self, Oriented to Place, Oriented to  Time, Oriented to Situation Alcohol / Substance Use: Not Applicable Psych Involvement: No (comment)  Admission diagnosis:  Closed fracture of right ankle, initial encounter [S82.891A] Trimalleolar fracture of ankle, closed, right, initial encounter [F64.332R] Patient Active Problem List   Diagnosis Date Noted  . Trimalleolar fracture  of ankle, closed, right, initial encounter 02/07/2019  . Multiple lung nodules 03/31/2014  . 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 OSA on CPAP 03/27/2014   PCP:  Derinda Late, MD Pharmacy:    [facility] [address], [place] [address] Phone: [phone] Fax: [phone]  [place], [place] [address] [address] Phone: [phone] Fax: [place], [place] [address] Phone: [phone] Fax: [phone]  [place], [place] [address] [place] [address] Phone: [phone] Fax: [phone]     Social Determinants of Health (SDOH) Interventions    Readmission Risk Interventions No flowsheet data found.

## 2019-02-11 NOTE — Progress Notes (Signed)
D: Pt alert and oriented. Pt denies experiencing any pain at this time. Pt states she did not sleep well last night and has been very tired today. Pt denies experiencing any AVH today.  A: Scheduled medications administered to pt, per MD orders. Support and encouragement provided. Frequent verbal contact made.   R: No adverse drug reactions noted. Pt complaint with medications and treatment plan. Pt interacts well with staff on the unit. Pt is stable at this time, will continue to monitor and provide care for as ordered.

## 2019-02-11 NOTE — Progress Notes (Signed)
Physical Therapy Treatment Patient Details Name: Jasmine Morrow MRN: [id number] DOB: 04-21-1945 Today's Date: 02/11/2019    History of Present Illness Jasmine Morrow is a 3yoF who is admitted for R trimalleolar fx and is now s/p ORIF on 02/08/19. History includes HTN. Pt complains of falling while trying to get into a car and fell on her R side and hit her head. Postoperatively pt is RLE TDWB.    PT Comments    Pt in bed upon entry agreeable to participate. Pt reports some mild dyspnea upon entry that she says has persisted since being off floor for imaging, she assumes this is related to bed positioning and being too flat, but asks author to lower thermostat to help with stuffiness and ease of breathing. Pt more alert and interactive, but remains quite drowsy. Pt also fairly limited with insight and problem solving at times, not able to manage bed controls, no awareness of purewick placement, unable to locate her water cup, and confused about the meal tray in room (thinks it is lunch rather than breakfast and purports that it belongs to someone else.) Pt struggles with basic limb movements during therex in bed, all requires physical assistance and pt has fatigue that requires recovery mid set prior to completing 15 reps. Pt remains motivated but fatigued. Will continue to follow.      Follow Up Recommendations  SNF     Equipment Recommendations       Recommendations for Other Services       Precautions / Restrictions Precautions Precautions: Fall Restrictions RLE Weight Bearing: Touchdown weight bearing    Mobility  Bed Mobility Overal bed mobility: (deferred d/t substantial limb weakness, drowsiness, and cognitive defciits)                Transfers                    Ambulation/Gait                 Stairs             Wheelchair Mobility    Modified Rankin (Stroke Patients Only)       Balance                                             Cognition Arousal/Alertness: Awake/alert   Overall Cognitive Status: No family/caregiver present to determine baseline cognitive functioning Area of Impairment: Problem solving;Awareness;Following commands;Safety/judgement                       Following Commands: Follows multi-step commands with increased time;Follows multi-step commands inconsistently;Follows one step commands consistently   Awareness: Anticipatory Problem Solving: Slow processing;Decreased initiation;Requires verbal cues;Requires tactile cues        Exercises General Exercises - Lower Extremity Ankle Circles/Pumps: AROM;15 reps;Left;AAROM;Supine Gluteal Sets: Left;15 reps;AROM;AAROM;Supine Short Arc Quad: AAROM;Both;15 reps;Supine Heel Slides: AAROM;Both;15 reps;Supine Hip ABduction/ADduction: AAROM;Both;15 reps;Supine Straight Leg Raises: AAROM;Both;Supine;15 reps Heel Raises: Strengthening;Left;15 reps;Limitations;Supine Heel Raises Limitations: author provides manual resistance    General Comments        Pertinent Vitals/Pain Pain Assessment: 0-10 Pain Score: 8  Pain Location: R ankle and back Pain Intervention(s): Limited activity within patient's tolerance;Monitored during session;Repositioned    Home Living  Prior Function            PT Goals (current goals can now be found in the care plan section) Acute Rehab PT Goals Patient Stated Goal: to go home PT Goal Formulation: With patient Time For Goal Achievement: 02/23/19 Potential to Achieve Goals: Poor Progress towards PT goals: Not progressing toward goals - comment    Frequency    BID      PT Plan Current plan remains appropriate    Co-evaluation              AM-PAC PT "6 Clicks" Mobility   Outcome Measure  Help needed turning from your back to your side while in a flat bed without using bedrails?: Total Help needed moving from lying on your back to sitting on the  side of a flat bed without using bedrails?: Total Help needed moving to and from a bed to a chair (including a wheelchair)?: Total Help needed standing up from a chair using your arms (e.g., wheelchair or bedside chair)?: Total Help needed to walk in hospital room?: Total Help needed climbing 3-5 steps with a railing? : Total 6 Click Score: 6    End of Session   Activity Tolerance: Patient limited by fatigue;Patient limited by pain;Patient limited by lethargy Patient left: in bed;with call bell/phone within reach;with nursing/sitter in room Nurse Communication: Mobility status PT Visit Diagnosis: Muscle weakness (generalized) (M62.81);History of falling (Z91.81);Difficulty in walking, not elsewhere classified (R26.2);Pain Pain - Right/Left: Right Pain - part of body: Ankle and joints of foot     Time: [id number] PT Time Calculation (min) (ACUTE ONLY): 32 min  Charges:  $Therapeutic Exercise: 23-37 mins                     9:59 AM, 02/11/19 Etta Grandchild, PT, DPT Physical Therapist - [facility]  [phone] ([place])    Jolaine Fryberger C 02/11/2019, 9:53 AM

## 2019-02-11 NOTE — Care Management Important Message (Signed)
Important Message  Patient Details  Name: Jasmine Morrow MRN: [id number] Date of Birth: January 28, 1946   Medicare Important Message Given:  Yes     [name] A Ayumi Wangerin 02/11/2019, 12:02 PM

## 2019-02-11 NOTE — NC FL2 (Signed)
[place] LEVEL OF CARE SCREENING TOOL     IDENTIFICATION  Patient Name: Jasmine Morrow Birthdate: 08-07-1945 Sex: female Admission Date (Current Location): 02/07/2019  [place] and [place] Number:  Engineering geologist and Address:  [facility], [address], [place], [place] [address]      Provider Number: [id number]  Attending Physician Name and Address:  Hessie Knows, MD  Relative Name and Phone Number:       Current Level of Care: Hospital Recommended Level of Care: [place] Prior Approval Number:    Date Approved/Denied:   PASRR Number: [id number]  Discharge Plan: SNF    Current Diagnoses: Patient Active Problem List   Diagnosis Date Noted  . Trimalleolar fracture of ankle, closed, right, initial encounter 02/07/2019  . Multiple lung nodules 03/31/2014  . 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 OSA on CPAP 03/27/2014    Orientation RESPIRATION BLADDER Height & Weight     Self, Time, Situation, Place  Normal Continent, External catheter Weight: (!) 305 lb (138.3 kg) Height:  5\' 8"  (172.7 cm)  BEHAVIORAL SYMPTOMS/MOOD NEUROLOGICAL BOWEL NUTRITION STATUS  (None) (None) Continent Diet(Regular)  AMBULATORY STATUS COMMUNICATION OF NEEDS Skin   Extensive Assist Verbally Surgical wounds                       Personal Care Assistance Level of Assistance  Bathing, Feeding, Dressing Bathing Assistance: Maximum assistance Feeding assistance: Limited assistance Dressing Assistance: Maximum assistance     Functional Limitations Info  Sight, Hearing, Speech Sight Info: Adequate Hearing Info: Adequate Speech Info: Adequate    SPECIAL CARE FACTORS FREQUENCY  PT (By licensed PT), OT (By licensed OT)     PT Frequency: 5 x week OT Frequency: 5 x week            Contractures Contractures Info: Not present    Additional Factors Info  Code Status, Allergies Code Status Info: Full Allergies Info:  Celecoxib, Hydrocodone-acetaminophen, Meloxicam, Morphine Sulfate Er Beads, Naproxen, Rofecoxib, Sulfa Antibiotics, Ciprofloxacin, Nickel, Tramadol-acetaminophen           Current Medications (02/11/2019):  This is the current hospital active medication list Current Facility-Administered Medications  Medication Dose Route Frequency Provider Last Rate Last Admin  . acetaminophen (TYLENOL) tablet 325-650 mg  325-650 mg Oral Q6H PRN Hessie Knows, MD   650 mg at 02/11/19 0732  . albuterol (PROVENTIL) (2.5 MG/3ML) 0.083% nebulizer solution 2.5 mg  2.5 mg Inhalation Q4H PRN Hessie Knows, MD      . aspirin EC tablet 81 mg  81 mg Oral Daily Hessie Knows, MD   81 mg at 02/11/19 1008  . bisacodyl (DULCOLAX) EC tablet 5 mg  5 mg Oral Daily PRN Hessie Knows, MD   5 mg at 02/10/19 1022  . bisacodyl (DULCOLAX) suppository 10 mg  10 mg Rectal Daily PRN Hessie Knows, MD   10 mg at 02/11/19 0419  . calcium-vitamin D (OSCAL WITH D) 500-200 MG-UNIT per tablet 2 tablet  2 tablet Oral Daily Hessie Knows, MD   2 tablet at 02/11/19 1008  . carvedilol (COREG) tablet 3.125 mg  3.125 mg Oral BID Hessie Knows, MD   3.125 mg at 02/11/19 1009  . Chlorhexidine Gluconate Cloth 2 % PADS 6 each  6 each Topical Daily Hessie Knows, MD   6 each at 02/10/19 1346  . Chlorhexidine Gluconate Cloth 2 % PADS 6 each  6 each Topical Daily Hessie Knows, MD  6 each at 02/11/19 1300  . cyclobenzaprine (FLEXERIL) tablet 10 mg  10 mg Oral Daily PRN Hessie Knows, MD      . diphenhydrAMINE (BENADRYL) 12.5 MG/5ML elixir 12.5-25 mg  12.5-25 mg Oral Q4H PRN Hessie Knows, MD      . docusate sodium (COLACE) capsule 100 mg  100 mg Oral BID Hessie Knows, MD   100 mg at 02/10/19 2108  . enoxaparin (LOVENOX) injection 40 mg  40 mg Subcutaneous Q24H Hessie Knows, MD   40 mg at 02/11/19 1009  . fentaNYL (SUBLIMAZE) injection 50 mcg  50 mcg Intravenous Q1H PRN Hessie Knows, MD      . HYDROmorphone (DILAUDID) injection 0.5-1 mg  0.5-1 mg  Intravenous Q4H PRN Hessie Knows, MD   1 mg at 02/07/19 2135  . loratadine (CLARITIN) tablet 10 mg  10 mg Oral Daily Hessie Knows, MD   10 mg at 02/11/19 1009  . magnesium citrate solution 1 Bottle  1 Bottle Oral Once PRN Hessie Knows, MD      . magnesium hydroxide (MILK OF MAGNESIA) suspension 30 mL  30 mL Oral Daily PRN Hessie Knows, MD   30 mL at 02/10/19 1726  . methocarbamol (ROBAXIN) tablet 500 mg  500 mg Oral Q6H PRN Hessie Knows, MD   500 mg at 02/10/19 0321   Or  . methocarbamol (ROBAXIN) 500 mg in dextrose 5 % 50 mL IVPB  500 mg Intravenous Q6H PRN Hessie Knows, MD      . metoCLOPramide (REGLAN) tablet 5-10 mg  5-10 mg Oral Q8H PRN Hessie Knows, MD       Or  . metoCLOPramide (REGLAN) injection 5-10 mg  5-10 mg Intravenous Q8H PRN Hessie Knows, MD      . multivitamin-lutein (OCUVITE-LUTEIN) capsule 2 capsule  2 capsule Oral BID Hessie Knows, MD   2 capsule at 02/11/19 1009  . mupirocin ointment (BACTROBAN) 2 % 1 application  1 application Nasal BID Hessie Knows, MD   1 application at [id number]  . ondansetron (ZOFRAN) tablet 4 mg  4 mg Oral Q6H PRN Hessie Knows, MD   4 mg at 02/08/19 2021   Or  . ondansetron (ZOFRAN) injection 4 mg  4 mg Intravenous Q6H PRN Hessie Knows, MD      . pantoprazole (PROTONIX) EC tablet 40 mg  40 mg Oral Daily Hessie Knows, MD   40 mg at 02/11/19 1009  . polyvinyl alcohol (LIQUIFILM TEARS) 1.4 % ophthalmic solution 1 drop  1 drop Both Eyes QID PRN Hessie Knows, MD      . sodium chloride flush (NS) 0.9 % injection 3 mL  3 mL Intravenous Q12H Dorise Hiss C, PA-C   3 mL at 02/11/19 1011  . sodium chloride flush (NS) 0.9 % injection 3 mL  3 mL Intravenous PRN Duanne Guess, PA-C      . traMADol Veatrice Bourbon) tablet 50-100 mg  50-100 mg Oral Q6H PRN Dorise Hiss C, PA-C   100 mg at 02/10/19 1726  . triamterene-hydrochlorothiazide (MAXZIDE-25) 37.5-25 MG per tablet 1 tablet  1 tablet Oral Daily Hessie Knows, MD   1 tablet at 02/11/19 1010  .  zolpidem (AMBIEN) tablet 5 mg  5 mg Oral QHS PRN Hessie Knows, MD   5 mg at 02/08/19 2018     Discharge Medications: Please see discharge summary for a list of discharge medications.  Relevant Imaging Results:  Relevant Lab Results:   Additional Information SS#: [id number]  Candie Chroman, LCSW

## 2019-02-12 ENCOUNTER — Inpatient Hospital Stay: Payer: Medicare Other

## 2019-02-12 ENCOUNTER — Other Ambulatory Visit: Admission: RE | Admit: 2019-02-12 | Payer: Medicare Other | Source: Ambulatory Visit

## 2019-02-12 LAB — URINALYSIS, COMPLETE (UACMP) WITH MICROSCOPIC
Bacteria, UA: NONE SEEN
Bilirubin Urine: NEGATIVE
Glucose, UA: NEGATIVE mg/dL
Hgb urine dipstick: NEGATIVE
Ketones, ur: NEGATIVE mg/dL
Leukocytes,Ua: NEGATIVE
Nitrite: NEGATIVE
Protein, ur: NEGATIVE mg/dL
Specific Gravity, Urine: 1.021 (ref 1.005–1.030)
pH: 5 (ref 5.0–8.0)

## 2019-02-12 MED ORDER — ENOXAPARIN SODIUM 40 MG/0.4ML ~~LOC~~ SOLN
40.0000 mg | Freq: Two times a day (BID) | SUBCUTANEOUS | Status: DC
  Administered 2019-02-13 – 2019-02-14 (×4): 40 mg via SUBCUTANEOUS
  Filled 2019-02-12 (×4): qty 0.4

## 2019-02-12 NOTE — Evaluation (Signed)
Occupational Therapy Evaluation Patient Details Name: Jasmine Morrow MRN: [id number] DOB: 02-12-1945 Today's Date: 02/12/2019    History of Present Illness Jasmine Morrow is a 24yoF who is admitted for R trimalleolar fx and is now s/p ORIF on 02/08/19. History includes HTN. Pt complains of falling while trying to get into a car and fell on her R side and hit her head. Postoperatively pt is RLE TDWB.   Clinical Impression   Ms. Boateng was seen for OT evaluation this date. Pt received semi-supine in bed, with spouse present at bedside. Pt oriented to self and limited place. Is observed to pick at mouth consistently throughout session and requires prompting to open her eyes/attend to this therapist. Pt is able to follow VCs with increased time and multimodal cueing. Per spouse, prior to hospital admission, pt was active and independent, going to the gym regularly and managing ADLs independently. Pt lives with her spouse in a 1 level home with 1 step to enter. Currently pt demonstrates impairments as described below (See OT problem list below) which functionally limit her ability to perform ADL/self-care tasks. Pt currently requires max to total assist for bed level ADL management. Pt has not yet been able to participate in functional mobility with therapy staff. Will likely require heavy 3+ assist.  Pt would benefit from skilled OT to address noted impairments and functional limitations (see below for any additional details) in order to maximize safety and independence while minimizing falls risk and caregiver burden.  Upon hospital discharge, recommend STR to maximize pt safety and return to PLOF.    Follow Up Recommendations  SNF    Equipment Recommendations  3 in 1 bedside commode    Recommendations for Other Services       Precautions / Restrictions Precautions Precautions: Fall Restrictions Weight Bearing Restrictions: Yes RLE Weight Bearing: Touchdown weight bearing      Mobility  Bed Mobility   Bed Mobility: Rolling Rolling: Max assist;Total assist         General bed mobility comments: Pt limited by pain/cognition unsafe or bed mobility this date. Per chart, requires max/total A for rolling in bed. L  Transfers                 General transfer comment: unable    Balance Overall balance assessment: Needs assistance;History of Falls                                         ADL either performed or assessed with clinical judgement   ADL Overall ADL's : Needs assistance/impaired                                       General ADL Comments: Pt significantly limited by impaired cognition, decreased strength, decreased activity tolerance and RLE pain. Requires bed level ADL management with max A for LB ADL tasks. Spouse at bedside indicates she has reqired moderate assist for UB tasks. Pt is able to don/doff mask during OT session with increased processing time and tactile prompting from this therapist. Will continue to assess as cognition improves.     Vision Baseline Vision/History: Wears glasses Wears Glasses: At all times Patient Visual Report: No change from baseline       Perception     Praxis  Pertinent Vitals/Pain Pain Assessment: 0-10 Pain Score: 7  Pain Location: RLE Pain Descriptors / Indicators: Grimacing;Guarding Pain Intervention(s): Limited activity within patient's tolerance;Monitored during session;Repositioned     Hand Dominance Left   Extremity/Trunk Assessment Upper Extremity Assessment Upper Extremity Assessment: Generalized weakness   Lower Extremity Assessment Lower Extremity Assessment: Generalized weakness;RLE deficits/detail;Defer to PT evaluation RLE Deficits / Details: s/p ORIF R trimalleolar pt to be TDWB RLE Coordination: decreased gross motor       Communication Communication Communication: No difficulties   Cognition Arousal/Alertness: Awake/alert Behavior During  Therapy: WFL for tasks assessed/performed Overall Cognitive Status: Impaired/Different from baseline Area of Impairment: Following commands;Safety/judgement;Awareness;Problem solving                       Following Commands: Follows one step commands consistently;Follows one step commands with increased time;Follows multi-step commands inconsistently Safety/Judgement: Decreased awareness of safety;Decreased awareness of deficits Awareness: Anticipatory Problem Solving: Slow processing;Decreased initiation;Requires verbal cues;Requires tactile cues General Comments: Pt is oriented to self and limited place. Husband at bedside indicates this is far from her baseline. States he believes it is from medications. Will continue to assess.   General Comments  Pt spouse at bedside, endorses frustration over hospital care this date. OT utilized therapeutic use of self throughout session to build rapport and encouraged spouse to express concerns to hospital management.    Exercises Other Exercises Other Exercises: BLE hip ABDCT/ADD 1x15; BLE heel slides 1x15; BLE SAQ; LLE combo hip/knee extension manually resisted leg press 1x10; LLE marching(Pt has improved A/ROM on left, still needs some pjhysical assist on the RLE.) Other Exercises: Pt and caregiver educated on falls prevention strategies for home and hospital as well as role of OT in acute care setting.   Shoulder Instructions      Home Living Family/patient expects to be discharged to:: Private residence Living Arrangements: Spouse/significant other Available Help at Discharge: Family;Available 24 hours/day Type of Home: House Home Access: Stairs to enter [employer] of Steps: 1 Entrance Stairs-Rails: None Home Layout: One level               Home Equipment: Walker - 4 wheels          Prior Functioning/Environment Level of Independence: Needs assistance        Comments: Pt reports she was very active prior  to admission, working out at [employer] and using the treadmill. She reports no other falls previously, wasn't using an AD        OT Problem List: Decreased strength;Decreased coordination;Pain;Cardiopulmonary status limiting activity;Decreased activity tolerance;Decreased safety awareness;Impaired balance (sitting and/or standing);Decreased knowledge of use of DME or AE;Decreased knowledge of precautions;Obesity;Decreased range of motion;Decreased cognition      OT Treatment/Interventions: Self-care/ADL training;Therapeutic exercise;Therapeutic activities;DME and/or AE instruction;Patient/family education;Balance training;Energy conservation    OT Goals(Current goals can be found in the care plan section) Acute Rehab OT Goals Patient Stated Goal: to go home OT Goal Formulation: With patient Time For Goal Achievement: 02/26/19 ADL Goals Pt Will Perform Lower Body Dressing: sit to/from stand;with min assist;with adaptive equipment(With LRAD PRN for improved safety and functional independence.) Pt Will Transfer to Toilet: stand pivot transfer;bedside commode;with mod assist;squat pivot transfer Pt Will Perform Toileting - Clothing Manipulation and hygiene: with adaptive equipment;sitting/lateral leans;with min assist;with caregiver independent in assisting Additional ADL Goal #1: Pt will independently verbalize a plan to implement at least 3 learned falls prevention strategies into her daily routines/home environment for improved safety and functional independence  upon hospital DC.  OT Frequency: Min 2X/week   Barriers to D/C: Decreased caregiver support;Inaccessible home environment          Co-evaluation              AM-PAC OT "6 Clicks" Daily Activity     Outcome Measure Help from another person eating meals?: A Little Help from another person taking care of personal grooming?: A Little Help from another person toileting, which includes using toliet, bedpan, or urinal?:  Total Help from another person bathing (including washing, rinsing, drying)?: A Lot Help from another person to put on and taking off regular upper body clothing?: A Little Help from another person to put on and taking off regular lower body clothing?: A Lot 6 Click Score: 14   End of Session    Activity Tolerance: Patient limited by lethargy;Treatment limited secondary to medical complications (Comment) Patient left: in bed;with call bell/phone within reach;with bed alarm set;with family/visitor present  OT Visit Diagnosis: Other abnormalities of gait and mobility (R26.89);History of falling (Z91.81);Pain Pain - Right/Left: Right Pain - part of body: Leg;Ankle and joints of foot                Time: [id number] OT Time Calculation (min): 37 min Charges:  OT General Charges $OT Visit: 1 Visit OT Evaluation $OT Eval Moderate Complexity: 1 Mod OT Treatments $Self Care/Home Management : 8-22 mins  Shara Blazing, M.S., OTR/L Ascom: [phone] 02/12/19, 4:44 PM

## 2019-02-12 NOTE — Progress Notes (Signed)
RT called to notify of CPAP order for patient. Patient has her own CPAP. Upon entering room to assess the CPAP for use, husband expressed his concern that his wife had not been placed on CPAP since admission and it had been in patients room the entire time. Patient not on O2 and order for CPAP had just been placed this afternoon, so RT had no way of knowing patient had a CPAP to be worn.  RT spoke with patients husband about his concerns and stressed that order was now in place for usage of her CPAP and that RT will check on patient and assist in placing CPAP on if needed.

## 2019-02-12 NOTE — TOC Progression Note (Signed)
Transition of Care [facility]) - Progression Note    Patient Details  Name: Jasmine Morrow MRN: [id number] Date of Birth: [birth year]  Transition of Care [facility]) CM/SW [place], LCSW Phone Number: 02/12/2019, 4:07 PM  Clinical Narrative: Patient's husband and son have decided to pursue [facility]. Son was talking with admissions coordinator and mentioned her CPAP but admissions coordinator stated they don't take those. CSW explained no CPAP machine is documented in flowsheets showing that she is using it at nighttime. CSW and husband discussed with patient and she said she has been putting it on herself at night. CSW explained that maybe since she claims she is putting it on herself, it is not being documented. Husband concerned her change in mental status may be caused by her possibly not using it at night. [facility] told son that they can take her with the CPAP machine as long as she can put it on and take it off herself. If not, they will have to use regular oxygen at night. RN has been updated.     Expected Discharge Plan: Skilled Nursing Facility Barriers to Discharge: Ship broker, Continued Medical Work up  Expected Discharge Plan and Services Expected Discharge Plan: [place] Choice: [place] arrangements for the past 2 months: Single Family Home                                       Social Determinants of Health (SDOH) Interventions    Readmission Risk Interventions No flowsheet data found.

## 2019-02-12 NOTE — Progress Notes (Signed)
PT Cancellation Note  Patient Details Name: Jasmine Morrow MRN: [id number] DOB: 1945-06-04   Cancelled Treatment:    Reason Eval/Treat Not Completed: Fatigue/lethargy limiting ability to participate;Patient's level of consciousness(Pt somnolent this afternoon, unable to remain awake to participate in session. Met with pt/husband at bed side. Husband concerned that no one has helped put CPAP on patient since being admitted. RN made aware of pt's concerns. Will hold PT treatment.)   5:09 PM, 02/12/19 Etta Grandchild, PT, DPT Physical Therapist - [facility]  [phone] ([place])     Brevon Dewald C 02/12/2019, 5:08 PM

## 2019-02-12 NOTE — Progress Notes (Signed)
   Subjective: 4 Days Post-Op Procedure(s) (LRB): OPEN REDUCTION INTERNAL FIXATION (ORIF) ANKLE FRACTURE, post malleolus (Right) SYNDESMOSIS REPAIR (Right) Patient reports pain as 5 on 0-10 scale.  Sleeping this am upon entering room. Patient is well, and has had no acute complaints or problems Denies any CP, SOB, ABD pain. We will continue with physical therapy today. Plan is to go Skilled nursing facility after hospital stay.  Patient unable to care for herself at home nor with assistance of her husband due to ankle surgery and weightbearing status.  Objective: Vital signs in last 24 hours: Temp:  [97.7 F (36.5 C)-98.1 F (36.7 C)] 98.1 F (36.7 C) (01/11 2318) Pulse Rate:  [70-96] 96 (01/11 2318) Resp:  [20] 20 (01/11 2318) BP: (134-149)/(67-82) 145/77 (01/11 2318) SpO2:  [94 %-97 %] 94 % (01/11 2318)  Intake/Output from previous day: 01/11 0701 - 01/12 0700 In: 0  Out: 650 [Urine:650] Intake/Output this shift: No intake/output data recorded.  No results for input(s): HGB in the last 72 hours. No results for input(s): WBC, RBC, HCT, PLT in the last 72 hours. No results for input(s): NA, K, CL, CO2, BUN, CREATININE, GLUCOSE, CALCIUM in the last 72 hours. No results for input(s): LABPT, INR in the last 72 hours.  EXAM General - Patient is Alert, Appropriate and Oriented Extremity - Neurovascular intact Sensation intact distally Intact pulses distally No cellulitis present Compartment soft  Splint intact right lower extremity Dressing - dressing C/D/I and no drainage Motor Function - intact, moving toes well on exam.   Past Medical History:  Diagnosis Date  . Hypertension   . Seasonal allergies     Assessment/Plan:   4 Days Post-Op Procedure(s) (LRB): OPEN REDUCTION INTERNAL FIXATION (ORIF) ANKLE FRACTURE, post malleolus (Right) SYNDESMOSIS REPAIR (Right) Active Problems:   Trimalleolar fracture of ankle, closed, right, initial encounter   Acute on chronic  low back pain Estimated body mass index is 46.38 kg/m as calculated from the following:   Height as of this encounter: 5\' 8"  (1.727 m).   Weight as of this encounter: 138.3 kg. Advance diet Up with therapy, TTWB RLE X-ray thoracic spine negative for fracture. Continue tramadol for pain control. Continue with nausea medication as needed. Care management to assist with discharge.  Patient will need to be discharged to skilled nursing facility pending authorization for SNF    DVT Prophylaxis - Lovenox Toe-touch weightbearing right lower extremity   T. Rachelle Hora, PA-C [place] 02/12/2019, 7:25 AM

## 2019-02-12 NOTE — Progress Notes (Signed)
Physical Therapy Treatment Patient Details Name: Jasmine Morrow MRN: [id number] DOB: [birth year] Today's Date: 02/12/2019    History of Present Illness Jasmine Morrow is a 26yoF who is admitted for R trimalleolar fx and is now s/p ORIF on 02/08/19. History includes HTN. Pt complains of falling while trying to get into a car and fell on her R side and hit her head. Postoperatively pt is RLE TDWB.    PT Comments    Pt in bed upon entry sleeping, but easily awakened and agreeable to participate. Pt more alert this date that yesterday, but still has some cognitive impairment and awareness issues. Pt struggles with following commands at times. Pt better able to activate Rt quads this date, but they remain weak and limited, I suspect a chronic issue. Pt also noted to have severe discomfort with Left knee flexion > 80 degrees, hence author modified bed level exercises for improved tolerance. Pt declines mobility training to EOB for fears that she will get dizzy, then she reports that she is currently dizzy and previously nauseated this morning.   Follow Up Recommendations  SNF     Equipment Recommendations       Recommendations for Other Services       Precautions / Restrictions Precautions Precautions: Fall Restrictions RLE Weight Bearing: Touchdown weight bearing    Mobility  Bed Mobility               General bed mobility comments: Pt not agreeable to moving to EOB this date; reports concerned that she will become dizzy  Transfers                    Ambulation/Gait                 Stairs             Wheelchair Mobility    Modified Rankin (Stroke Patients Only)       Balance                                            Cognition Arousal/Alertness: Awake/alert Behavior During Therapy: WFL for tasks assessed/performed Overall Cognitive Status: Within Functional Limits for tasks assessed                          Following Commands: Follows one step commands consistently;Follows one step commands with increased time;Follows multi-step commands inconsistently   Awareness: Anticipatory Problem Solving: Slow processing;Decreased initiation;Requires verbal cues;Requires tactile cues        Exercises Other Exercises Other Exercises: BLE hip ABDCT/ADD 1x15; BLE heel slides 1x15; BLE SAQ; LLE combo hip/knee extension manually resisted leg press 1x10; LLE marching(Pt has improved A/ROM on left, still needs some pjhysical assist on the RLE.)    General Comments        Pertinent Vitals/Pain Pain Assessment: 0-10 Pain Score: 6  Pain Location: R ankle and low back Pain Descriptors / Indicators: Operative site guarding Pain Intervention(s): Limited activity within patient's tolerance;Monitored during session;Premedicated before session;Repositioned;Patient requesting pain meds-RN notified    Home Living                      Prior Function            PT Goals (current goals can now be found in the care plan section)  Acute Rehab PT Goals Patient Stated Goal: to go home PT Goal Formulation: With patient Time For Goal Achievement: 02/23/19 Potential to Achieve Goals: Poor Progress towards PT goals: Not progressing toward goals - comment    Frequency    BID      PT Plan Current plan remains appropriate    Co-evaluation              AM-PAC PT "6 Clicks" Mobility   Outcome Measure  Help needed turning from your back to your side while in a flat bed without using bedrails?: Total Help needed moving from lying on your back to sitting on the side of a flat bed without using bedrails?: Total Help needed moving to and from a bed to a chair (including a wheelchair)?: Total Help needed standing up from a chair using your arms (e.g., wheelchair or bedside chair)?: Total Help needed to walk in hospital room?: Total Help needed climbing 3-5 steps with a railing? : Total 6 Click  Score: 6    End of Session   Activity Tolerance: Patient limited by fatigue;Patient tolerated treatment well Patient left: in bed;with call bell/phone within reach;with nursing/sitter in room;with family/visitor present Nurse Communication: Mobility status PT Visit Diagnosis: Muscle weakness (generalized) (M62.81);History of falling (Z91.81);Difficulty in walking, not elsewhere classified (R26.2);Pain Pain - Right/Left: Right Pain - part of body: Ankle and joints of foot     Time: 1115-1135 PT Time Calculation (min) (ACUTE ONLY): 20 min  Charges:  $Therapeutic Exercise: 8-22 mins                     1:10 PM, 02/12/19 Etta Grandchild, PT, DPT Physical Therapist - [facility]  [phone] ([place])    [place] C 02/12/2019, 1:08 PM

## 2019-02-13 LAB — CREATININE, SERUM
Creatinine, Ser: 1.14 mg/dL — ABNORMAL HIGH (ref 0.44–1.00)
GFR calc Af Amer: 56 mL/min — ABNORMAL LOW (ref >60)
GFR calc non Af Amer: 48 mL/min — ABNORMAL LOW (ref >60)

## 2019-02-13 LAB — RESPIRATORY PANEL BY RT PCR (FLU A&B, COVID)
Influenza A by PCR: NEGATIVE
Influenza B by PCR: NEGATIVE
SARS Coronavirus 2 by RT PCR: NEGATIVE

## 2019-02-13 MED ORDER — AZITHROMYCIN 500 MG PO TABS
500.0000 mg | ORAL_TABLET | Freq: Once | ORAL | Status: AC
  Administered 2019-02-13: 500 mg via ORAL
  Filled 2019-02-13: qty 1

## 2019-02-13 MED ORDER — ASPIRIN EC 325 MG PO TBEC: 325.0000 mg | DELAYED_RELEASE_TABLET | Freq: Every day | ORAL | 0 refills | Status: DC

## 2019-02-13 MED ORDER — AZITHROMYCIN 250 MG PO TABS: 250.0000 mg | ORAL_TABLET | Freq: Every day | ORAL | 0 refills | Status: AC

## 2019-02-13 NOTE — Progress Notes (Signed)
During shift report approx. 7:30 am pt stated she removed CPAP

## 2019-02-13 NOTE — Progress Notes (Signed)
Pt's spouse called at the beginning of shift enquiring about pt's use of CPAP. Informed that pt wanted to wait until after meds to apply CPAP. Approximately 2215 RT came and applied CPAP to pt.

## 2019-02-13 NOTE — Progress Notes (Signed)
Occupational Therapy Treatment Patient Details Name: Jasmine Morrow MRN: [id number] DOB: [birth year] Today's Date: 02/13/2019    History of present illness Jasmine Morrow is a 57yoF who is admitted for R trimalleolar fx and is now s/p ORIF on 02/08/19. History includes HTN. Pt complains of falling while trying to get into a car and fell on her R side and hit her head. Postoperatively pt is RLE TDWB.   OT comments  Jasmine Morrow was seen for OT treatment on this date. Upon arrival to room pt seated on EOB with caregiver and physical therapist present at bedside. Physical therapist stated they were just finishing up treatment session. Pt agreeable to continuing with occupational therapy treatment session. Pt and spouse indicating pt needs to wash and comb her hair. This author provides warmed shampoo cap and provides min assist for pt seated grooming/bathing tasks with consistent cueing for safety and sequencing. See ADL section below for additional details. Pt fatigues quickly with seated grooming tasks and this author provides max assist to return to bed with caregiver eager to provide additional assistance and encourages pt to hold his arm to reposition her torso in bed.  Pt and caregiver express dissatisfaction with hospital experience. This Pryor Curia provides pt husband with number to [facility] Patient Experience. Unit assistant director also notified/aware of pt/caregiver concerns. Pt continues to benefit from skilled OT services to maximize return to PLOF and minimize risk of future falls, injury, caregiver burden, and readmission. Will continue to follow POC as written. Discharge recommendation remains appropriate.    Follow Up Recommendations  SNF    Equipment Recommendations  3 in 1 bedside commode    Recommendations for Other Services      Precautions / Restrictions Precautions Precautions: Fall Restrictions Weight Bearing Restrictions: Yes RLE Weight Bearing: Touchdown weight bearing        Mobility Bed Mobility Overal bed mobility: Needs Assistance Bed Mobility: Sit to Supine     Supine to sit: Mod assist;HOB elevated Sit to supine: Max assist   General bed mobility comments: Pt has difficulty sequencing movements to return to supine in bed this date. Requires max A for BLE mgt and to adjust position in bed. Pt does well maintaining TWB status during sit>sup transfer.  Transfers Overall transfer level: (deferred d/t weakness and TWB)               General transfer comment: Deferred.    Balance Overall balance assessment: Needs assistance;History of Falls Sitting-balance support: Feet supported Sitting balance-Leahy Scale: Good Sitting balance - Comments: Steady static and dynamic sitting EOB with LLE supported and pt maintaining TWB status through RLE.                                   ADL either performed or assessed with clinical judgement   ADL Overall ADL's : Needs assistance/impaired     Grooming: Sitting;Wash/dry face;Set up;Supervision/safety;Cueing for sequencing;Cueing for safety;Brushing hair   Upper Body Bathing: Set up;Minimal assistance;Sitting;Cueing for safety;Cueing for sequencing Upper Body Bathing Details (indicate cue type and reason): Pt able to use BUE to wash hair using a shampoo cap this date. Requires min assist to don/doff cap as well as for completion of task. Pt is easily distracted this date, however, cognition appears improved from time of initial evaluation. Pt keeps eyes open throughout session and is able to follow 1 step VCs consistently.  General ADL Comments: Pt continues to be limited by impaired cognition, decreased strength, decreased activity tolerance and RLE pain. She is able to sit EOB this date to perform seated grooming tasks while maintaining TWB through her RLE. Pt requires consistent cueing for sequencing and attendance to tasks.     Vision Baseline  Vision/History: Wears glasses Wears Glasses: At all times Patient Visual Report: No change from baseline     Perception     Praxis      Cognition Arousal/Alertness: Awake/alert Behavior During Therapy: WFL for tasks assessed/performed Overall Cognitive Status: Impaired/Different from baseline Area of Impairment: Attention                   Current Attention Level: Selective   Following Commands: Follows one step commands consistently;Follows multi-step commands inconsistently;Follows multi-step commands with increased time Safety/Judgement: Decreased awareness of safety;Decreased awareness of deficits   Problem Solving: Slow processing;Decreased initiation;Requires verbal cues;Requires tactile cues General Comments: Pt oriented to self, situation, and place this date. Spouse at bedside endorses this is because the pt "wore her CPAP machine last night". Spouse continues to endorse frustrations with hospital staff and patient experience. Unit assistant director aware of pt/caregiver complaints.        Exercises Other Exercises Other Exercises: Seated LAQ 1x15 bilat; seated marching 1x15 bilat; seated Left ankle PF 1x15 Other Exercises: OT engages pt in seated UB grooming and bathing tasks with consistent prompting for sequencing and safety. Pt requires min assist for hair washing using a shampoo cap (see ADL section for detail).   Shoulder Instructions       General Comments Pt and spouse express dissatisfaction with hospital staff this date. This therapist provides number for Patient Experience to spouse and encourge him to call with questions/concerns. Unit assistant director also notified/aware of caregiver concerns.    Pertinent Vitals/ Pain       Pain Assessment: 0-10 Pain Score: 5  Pain Location: RLE, low back Pain Descriptors / Indicators: Sore;Grimacing;Guarding Pain Intervention(s): Limited activity within patient's tolerance;Monitored during  session;Repositioned  Home Living                                          Prior Functioning/Environment              Frequency  Min 2X/week        Progress Toward Goals  OT Goals(current goals can now be found in the care plan section)  Progress towards OT goals: Progressing toward goals  Acute Rehab OT Goals Patient Stated Goal: to go home OT Goal Formulation: With patient Time For Goal Achievement: 02/26/19  Plan Discharge plan remains appropriate;Frequency remains appropriate    Co-evaluation                 AM-PAC OT "6 Clicks" Daily Activity     Outcome Measure   Help from another person eating meals?: A Little Help from another person taking care of personal grooming?: A Little Help from another person toileting, which includes using toliet, bedpan, or urinal?: A Lot Help from another person bathing (including washing, rinsing, drying)?: A Lot Help from another person to put on and taking off regular upper body clothing?: A Little Help from another person to put on and taking off regular lower body clothing?: A Lot 6 Click Score: 15    End of Session    OT Visit  Diagnosis: Other abnormalities of gait and mobility (R26.89);History of falling (Z91.81);Pain Pain - Right/Left: Right Pain - part of body: Leg;Ankle and joints of foot   Activity Tolerance Patient tolerated treatment well;No increased pain   Patient Left in bed;with call bell/phone within reach;with family/visitor present;Other (comment)(With NTs in room for patient care.)   Nurse Communication          Time: [id number] OT Time Calculation (min): 27 min  Charges: OT General Charges $OT Visit: 1 Visit OT Treatments $Self Care/Home Management : 23-37 mins  Shara Blazing, M.S., OTR/L Ascom: [phone] 02/13/19, 1:35 PM

## 2019-02-13 NOTE — [facility] Notes (Signed)
   Subjective: 5 Days Post-Op Procedure(s) (LRB): OPEN REDUCTION INTERNAL FIXATION (ORIF) ANKLE FRACTURE, post malleolus (Right) SYNDESMOSIS REPAIR (Right) Patient reports pain as mild.  States she rested better last night.  She did use her CPAP.  Denies any confusion.  No complaints this morning. Denies any CP, SOB, ABD pain. We will continue with physical therapy today. Plan is to go Skilled nursing facility after hospital stay.  Patient unable to care for herself at home nor with assistance of her husband due to ankle surgery and weightbearing status.  Objective: Vital signs in last 24 hours: Temp:  [97.7 F (36.5 C)-98.3 F (36.8 C)] 98.1 F (36.7 C) (01/13 0739) Pulse Rate:  [88-106] 90 (01/13 0739) Resp:  [14-18] 14 (01/13 0739) BP: (111-139)/(69-87) 136/75 (01/13 0739) SpO2:  [94 %-98 %] 98 % (01/13 0739)  Intake/Output from previous day: No intake/output data recorded. Intake/Output this shift: No intake/output data recorded.  No results for input(s): HGB in the last 72 hours. No results for input(s): WBC, RBC, HCT, PLT in the last 72 hours. Recent Labs    02/13/19 0645  CREATININE 1.14*   No results for input(s): LABPT, INR in the last 72 hours.  EXAM General - Patient is Alert, Appropriate and Oriented Extremity - Neurovascular intact Sensation intact distally Intact pulses distally No cellulitis present Compartment soft  Splint intact right lower extremity Dressing - dressing C/D/I and no drainage Motor Function - intact, moving toes well on exam.   Past Medical History:  Diagnosis Date  . Hypertension   . Seasonal allergies     Assessment/Plan:   5 Days Post-Op Procedure(s) (LRB): OPEN REDUCTION INTERNAL FIXATION (ORIF) ANKLE FRACTURE, post malleolus (Right) SYNDESMOSIS REPAIR (Right) Active Problems:   Trimalleolar fracture of ankle, closed, right, initial encounter   Acute on chronic low back pain   Cough /cannot exclude infiltrate left lung  base  Estimated body mass index is 46.38 kg/m as calculated from the following:   Height as of this encounter: 5\' 8"  (1.727 m).   Weight as of this encounter: 138.3 kg. Advance diet Up with therapy, TTWB RLE Patient noted to have cough with slight confusion yesterday.  Most likely this was due to not wearing her CPAP at night.  Chest x-ray obtained showing mild bibasilar atelectasis, cannot exclude infiltrate at the left lung base.  Will start on azithromycin.  Urinalysis negative. Pain well controlled Vital signs are stable Patient stable and ready for discharge to skilled nursing facility. Covid test placed.   DVT Prophylaxis - Lovenox Toe-touch weightbearing right lower extremity   T. Rachelle Hora, PA-C [place] 02/13/2019, 7:56 AM

## 2019-02-13 NOTE — TOC Progression Note (Addendum)
Transition of Care [facility]) - Progression Note    Patient Details  Name: Jasmine Morrow MRN: [id number] Date of Birth: October 31, 1945  Transition of Care [facility]) CM/SW [place], LCSW Phone Number: 02/13/2019, 9:07 AM  Clinical Narrative: Per RN, patient can put her cpap machine on and off on her own. [facility] admissions coordinator will confirm they can take her with DON. Faxed clinicals to [facility] to start insurance authorization. COVID test is pending.    10:26 am: [facility] cannot accept patient with CPAP machine but can provide oxygen at night. Admissions coordinator later spoke to [place] coworker stating that she has notified patient's husband. Unsure of decision at this time.  4:10 pm: Husband will discuss possibility of 4 L oxygen at night rather than CPAP machine at [facility] with patient and their son tonight and follow up with [employer] and CSW in the morning.  Expected Discharge Plan: Skilled Nursing Facility Barriers to Discharge: Ship broker, Continued Medical Work up  Expected Discharge Plan and Services Expected Discharge Plan: [place] Choice: [place] arrangements for the past 2 months: Single Family Home                                       Social Determinants of Health (SDOH) Interventions    Readmission Risk Interventions No flowsheet data found.

## 2019-02-13 NOTE — [facility] Notes (Signed)
Physical Therapy Treatment Patient Details Name: Jasmine Morrow MRN: [id number] DOB: [birth year] Today's Date: 02/13/2019    History of Present Illness AVIVA ATTANASIO is a 62yoF who is admitted for R trimalleolar fx and is now s/p ORIF on 02/08/19. History includes HTN. Pt complains of falling while trying to get into a car and fell on her R side and hit her head. Postoperatively pt is RLE TDWB.    PT Comments    Pt more alert today, 50% less delay in response time and initiation- pt generally more interactive verbally. Orthostatic BP established in supine and sitting without symptoms or drop. Pt able to progress to sitting EOB, modA for physical assist, but no dizziness, and once there pt has good sitting balance being able to perform bimanual tasks for ADL without support. Pt assisted with seated exercises. BLE remain incredibly weak and Rt quads remain extraordinarily weak but pt and husband report this as baseline. Pt left sitting up with OT.     Follow Up Recommendations  SNF     Equipment Recommendations  None recommended by PT    Recommendations for Other Services       Precautions / Restrictions Precautions Precautions: Fall Restrictions RLE Weight Bearing: Touchdown weight bearing    Mobility  Bed Mobility Overal bed mobility: Needs Assistance Bed Mobility: Supine to Sit     Supine to sit: Mod assist;HOB elevated     General bed mobility comments: Author assists with RLE, pt uses heavy effort and BUE to pull self to EOB; able to sit unsupported for several minutes and perform seated exercises.  Transfers Overall transfer level: (deferred d/t weakness and TWB)                  Ambulation/Gait                 Stairs             Wheelchair Mobility    Modified Rankin (Stroke Patients Only)       Balance Overall balance assessment: Needs assistance;History of Falls Sitting-balance support: Feet supported Sitting balance-Leahy Scale:  Good                                      Cognition Arousal/Alertness: Awake/alert Behavior During Therapy: WFL for tasks assessed/performed Overall Cognitive Status: Impaired/Different from baseline                               Problem Solving: Slow processing;Decreased initiation;Requires verbal cues;Requires tactile cues        Exercises Other Exercises Other Exercises: Seated LAQ 1x15 bilat; seated marching 1x15 bilat; seated Left ankle PF 1x15    General Comments        Pertinent Vitals/Pain Pain Score: 5  Pain Location: RLE, low back Pain Descriptors / Indicators: Grimacing;Guarding    Home Living                      Prior Function            PT Goals (current goals can now be found in the care plan section) Acute Rehab PT Goals Patient Stated Goal: to go home PT Goal Formulation: With patient Time For Goal Achievement: 02/23/19 Potential to Achieve Goals: Poor Progress towards PT goals: Progressing toward goals    Frequency  BID      PT Plan Current plan remains appropriate    Co-evaluation              AM-PAC PT "6 Clicks" Mobility   Outcome Measure  Help needed turning from your back to your side while in a flat bed without using bedrails?: A Lot Help needed moving from lying on your back to sitting on the side of a flat bed without using bedrails?: A Lot Help needed moving to and from a bed to a chair (including a wheelchair)?: Total Help needed standing up from a chair using your arms (e.g., wheelchair or bedside chair)?: Total Help needed to walk in hospital room?: Total Help needed climbing 3-5 steps with a railing? : Total 6 Click Score: 8    End of Session   Activity Tolerance: Patient tolerated treatment well;No increased pain Patient left: in bed;with call bell/phone within reach;with nursing/sitter in room;with family/visitor present Nurse Communication: Mobility status PT Visit  Diagnosis: Muscle weakness (generalized) (M62.81);History of falling (Z91.81);Difficulty in walking, not elsewhere classified (R26.2);Pain Pain - Right/Left: Right Pain - part of body: Ankle and joints of foot     Time: 1137-1206 PT Time Calculation (min) (ACUTE ONLY): 29 min  Charges:  $Therapeutic Exercise: 23-37 mins                     12:28 PM, 02/13/19 Etta Grandchild, PT, DPT Physical Therapist - [facility]  [phone] ([place])    Mikele Sifuentes C 02/13/2019, 12:25 PM

## 2019-02-13 NOTE — Care Management Important Message (Signed)
Important Message  Patient Details  Name: Jasmine Morrow MRN: [id number] Date of Birth: 1945/07/31   Medicare Important Message Given:  Yes     [name] [name] 02/13/2019, 11:00 AM

## 2019-02-13 NOTE — [place] Notes (Signed)
Physical Therapy Treatment Patient Details Name: Jasmine Morrow MRN: [id number] DOB: [birth year] Today's Date: 02/13/2019    History of Present Illness Jasmine Morrow is a 14yoF who is admitted for R trimalleolar fx and is now s/p ORIF on 02/08/19. History includes HTN. Pt complains of falling while trying to get into a car and fell on her R side and hit her head. Postoperatively pt is RLE TDWB.    PT Comments    Pt in bed upon entry, resumed bed level exercises as on previous days. Pt showing significant improvement in isolated activation for exercises and being able to follow cues, as well as elicit greater force. Despite improved participation and focus, weakness remains a great barrier to advancing functional mobility, high level of physical assistance required for many limb exercises portends a long road ahead prior to independent transfers with single limb NWB/TWB. Pt remains focused and motivated.    Follow Up Recommendations  SNF     Equipment Recommendations  None recommended by PT    Recommendations for Other Services       Precautions / Restrictions Precautions Precautions: Fall Restrictions Weight Bearing Restrictions: Yes RLE Weight Bearing: Touchdown weight bearing    Mobility  Bed Mobility Overal bed mobility: Needs Assistance Bed Mobility: Sit to Supine     Supine to sit: Mod assist;HOB elevated Sit to supine: Max assist   General bed mobility comments: Pt has difficulty sequencing movements to return to supine in bed this date. Requires max A for BLE mgt and to adjust position in bed. Pt does well maintaining TWB status during sit>sup transfer.  Transfers Overall transfer level: (deferred d/t weakness and TWB)               General transfer comment: Deferred.  Ambulation/Gait                 Stairs             Wheelchair Mobility    Modified Rankin (Stroke Patients Only)       Balance Overall balance assessment: Needs  assistance;History of Falls Sitting-balance support: Feet supported Sitting balance-Leahy Scale: Good Sitting balance - Comments: Steady static and dynamic sitting EOB with LLE supported and pt maintaining TWB status through RLE.                                    Cognition Arousal/Alertness: Awake/alert Behavior During Therapy: WFL for tasks assessed/performed Overall Cognitive Status: Impaired/Different from baseline Area of Impairment: Attention                   Current Attention Level: Selective   Following Commands: Follows one step commands consistently;Follows multi-step commands inconsistently;Follows multi-step commands with increased time Safety/Judgement: Decreased awareness of safety;Decreased awareness of deficits   Problem Solving: Slow processing;Decreased initiation;Requires verbal cues;Requires tactile cues General Comments: generally improving, but remains minimally impaired.      Exercises Other Exercises Other Exercises: LLE : slr 1x10 (maxA), SAQ1x15, resisted leg press 2x15, ABDCT 1x15 Other Exercises: RLE: Bent knee raise 1x10 (maxA), heel slides 1x10 (maxA), SAQ 1x10 (maxA), ABDCT x15 modA    General Comments General comments (skin integrity, edema, etc.): Pt and spouse express dissatisfaction with hospital staff this date. This therapist provides number for Patient Experience to spouse and encourge him to call with questions/concerns. Unit assistant director also notified/aware of caregiver concerns.  Pertinent Vitals/Pain Pain Assessment: Faces Pain Score: 5  Faces Pain Scale: Hurts a little bit Pain Location: RLE, low back Pain Descriptors / Indicators: Sore;Grimacing;Guarding Pain Intervention(s): Limited activity within patient's tolerance;Monitored during session;Repositioned    Home Living                      Prior Function            PT Goals (current goals can now be found in the care plan section)  Acute Rehab PT Goals Patient Stated Goal: to go home PT Goal Formulation: With patient Time For Goal Achievement: 02/23/19 Potential to Achieve Goals: Poor Progress towards PT goals: Progressing toward goals    Frequency    BID      PT Plan Current plan remains appropriate    Co-evaluation              AM-PAC PT "6 Clicks" Mobility   Outcome Measure  Help needed turning from your back to your side while in a flat bed without using bedrails?: A Lot Help needed moving from lying on your back to sitting on the side of a flat bed without using bedrails?: A Lot Help needed moving to and from a bed to a chair (including a wheelchair)?: Total Help needed standing up from a chair using your arms (e.g., wheelchair or bedside chair)?: Total Help needed to walk in hospital room?: Total Help needed climbing 3-5 steps with a railing? : Total 6 Click Score: 8    End of Session   Activity Tolerance: Patient tolerated treatment well;Patient limited by fatigue;No increased pain Patient left: in bed;with call bell/phone within reach;with nursing/sitter in room;with family/visitor present Nurse Communication: Mobility status PT Visit Diagnosis: Muscle weakness (generalized) (M62.81);History of falling (Z91.81);Difficulty in walking, not elsewhere classified (R26.2);Pain Pain - Right/Left: Right Pain - part of body: Ankle and joints of foot     Time: 1530-1555 PT Time Calculation (min) (ACUTE ONLY): 25 min  Charges:  $Therapeutic Exercise: 23-37 mins                     4:24 PM, 02/13/19 Etta Grandchild, PT, DPT Physical Therapist - [facility]  [phone] ([place])    Jaimie Redditt C 02/13/2019, 4:19 PM

## 2019-02-13 NOTE — [place] Notes (Signed)
Rounded on pt approximately 00500, pt awake and CPAP removed. Pt reported that she did not want to wear CPAP and requested to be repositioned. Pt was repositioned, cleaned up and linens changed. Pt was then encouraged to wear CPAP and reminded about concerns spouse has about not wearing the CPAP. Pt then obliged and said "you are right, I think I'll wear it."

## 2019-02-14 MED ORDER — FLUCONAZOLE 100 MG PO TABS
100.0000 mg | ORAL_TABLET | Freq: Once | ORAL | Status: AC
  Administered 2019-02-14: 100 mg via ORAL
  Filled 2019-02-14: qty 1

## 2019-02-14 NOTE — TOC Transition Note (Signed)
Transition of Care [facility]) - CM/SW Discharge Note   Patient Details  Name: Jasmine Morrow MRN: [id number] Date of Birth: 10-03-1945  Transition of Care [facility]) CM/SW Contact:  Candie Chroman, LCSW Phone Number: 02/14/2019, 12:18 PM   Clinical Narrative: Patient has orders and insurance approval to discharge to [facility] today. Orders for bedtime oxygen are on discharge summary. Tramadol prescription is in her discharge packet with her EMS paperwork. RN will call report to [phone] Room 502 prior to setting up EMS transport. No further concerns. CSW signing off.    Final next level of care: Skilled Nursing Facility Barriers to Discharge: Barriers Resolved   Patient Goals and CMS Choice   CMS Medicare.gov Compare Post Acute Care list provided to:: Patient Represenative (must comment)(Husband) Choice offered to / list presented to : Patient, Adult Children, Spouse  Discharge Placement PASRR number recieved: 02/11/19            Patient [facility] bed at: [facility] Patient to be transferred to facility by: EMS Name of family member notified: Tiffanie Wentling Patient and family notified of of transfer: 02/14/19  Discharge Plan and Services     Post Acute Care Choice: [place]                               Social Determinants of Health (SDOH) Interventions     Readmission Risk Interventions No flowsheet data found.

## 2019-02-14 NOTE — Progress Notes (Signed)
   Subjective: 6 Days Post-Op Procedure(s) (LRB): OPEN REDUCTION INTERNAL FIXATION (ORIF) ANKLE FRACTURE, post malleolus (Right) SYNDESMOSIS REPAIR (Right) Patient reports pain as mild and well controlled. Not needing medications for pain. Using CPAP. Complains of yeast infection that started last night. No abd pain. Hx of yeast infections with every abx. Requesting fluconazole Denies any CP, SOB, ABD pain. BM yesterday. We will continue with physical therapy today. Plan is to go Skilled nursing facility after hospital stay.  Patient unable to care for herself at home nor with assistance of her husband due to ankle surgery and weightbearing status.  Objective: Vital signs in last 24 hours: Temp:  [97.5 F (36.4 C)-98.6 F (37 C)] 97.5 F (36.4 C) (01/14 0739) Pulse Rate:  [78-94] 78 (01/14 0739) Resp:  [14-18] 18 (01/14 0739) BP: (130-143)/(71-77) 143/75 (01/14 0739) SpO2:  [96 %-100 %] 100 % (01/14 0739)  Intake/Output from previous day: 01/13 0701 - 01/14 0700 In: -  Out: 1200 [Urine:1200] Intake/Output this shift: No intake/output data recorded.  No results for input(s): HGB in the last 72 hours. No results for input(s): WBC, RBC, HCT, PLT in the last 72 hours. Recent Labs    02/13/19 0645  CREATININE 1.14*   No results for input(s): LABPT, INR in the last 72 hours.  EXAM General - Patient is Alert, Appropriate and Oriented Extremity - Neurovascular intact Sensation intact distally Intact pulses distally No cellulitis present Compartment soft  Splint intact right lower extremity Dressing - dressing C/D/I and no drainage Motor Function - intact, moving toes well on exam.   Past Medical History:  Diagnosis Date  . Hypertension   . Seasonal allergies     Assessment/Plan:   6 Days Post-Op Procedure(s) (LRB): OPEN REDUCTION INTERNAL FIXATION (ORIF) ANKLE FRACTURE, post malleolus (Right) SYNDESMOSIS REPAIR (Right) Active Problems:   Trimalleolar fracture of  ankle, closed, right, initial encounter   Acute on chronic low back pain   Cough /cannot exclude infiltrate left lung base   Yeast Vaginitis  Estimated body mass index is 46.38 kg/m as calculated from the following:   Height as of this encounter: 5\' 8"  (1.727 m).   Weight as of this encounter: 138.3 kg. Advance diet Up with therapy, TTWB RLE  Cough -chest x-ray showed mild atelectasis and cannot exclude a left lung base infiltrate.  She is placed on azithromycin.  Yeast vaginitis -fluconazole ordered  Bowels moving well  Pain well controlled  Continue with CPAP  Discharged to skilled nursing facility pending authorization.   DVT Prophylaxis - Lovenox Toe-touch weightbearing right lower extremity   T. Rachelle Hora, PA-C [place] 02/14/2019, 8:51 AM

## 2019-02-14 NOTE — TOC Progression Note (Signed)
Transition of Care [facility]) - Progression Note    Patient Details  Name: Jasmine Morrow MRN: [id number] Date of Birth: 02-24-1945  Transition of Care [facility]) CM/SW [place], LCSW Phone Number: 02/14/2019, 11:26 AM  Clinical Narrative:  Patient and family have chosen to discharge to [facility] and use oxygen at night instead of her CPAP machine. Insurance authorization approved: [id number]. Sent message to PA requesting that oxygen order be entered into the discharge summary. [facility] can take up to 4 L.   Expected Discharge Plan: [place] Barriers to Discharge: Ship broker, Continued Medical Work up  Expected Discharge Plan and Services Expected Discharge Plan: [place] Choice: [place] arrangements for the past 2 months: Single Family Home Expected Discharge Date: 02/14/19                                     Social Determinants of Health (SDOH) Interventions    Readmission Risk Interventions No flowsheet data found.

## 2019-02-14 NOTE — Progress Notes (Signed)
Physical Therapy Treatment Patient Details Name: Jasmine Morrow MRN: [id number] DOB: 1945-10-26 Today's Date: 02/14/2019    History of Present Illness Jasmine Morrow is a 64yoF who is admitted for R trimalleolar fx and is now s/p ORIF on 02/08/19. History includes HTN. Pt complains of falling while trying to get into a car and fell on her R side and hit her head. Postoperatively pt is RLE TDWB.    PT Comments    .Pt in bed upon entry, awake and agreeable to participate. Pt reports motivation to get to commode this date as she is not 'thrilled' about using the bed pan. ModA to EOB where pt spends most of session time. Pt extensive educated on safe use of RW and technique for TWB transfers. From elevated surface pt is able to come STS with RW and Rt TWB woith Mod A, but pt struggles with Left TKE achievement and is unable to rise to full upright, ultimately collapses to sitting before 10sec. Pt attempts lateral scoot to BSC several times, but is never very successful. Pt follows commands well and puts forth good effort to assist in squat pivot transfer to/from [facility] always transferring to the left. OT in room for assistance with transfers for last portion of visit due to safety needs and assist with transfers. Pt progressing well, but remains heavily limited and requiring lots of physical assistance.   Follow Up Recommendations  SNF     Equipment Recommendations  None recommended by PT    Recommendations for Other Services       Precautions / Restrictions Precautions Precautions: Fall Restrictions Weight Bearing Restrictions: Yes RLE Weight Bearing: Touchdown weight bearing    Mobility  Bed Mobility Overal bed mobility: Needs Assistance Bed Mobility: Sit to Supine     Supine to sit: Mod assist;HOB elevated Sit to supine: Max assist;+2 for physical assistance   General bed mobility comments: Pt has difficulty sequencing movements to return to supine in bed this date. Requires max A  for BLE mgt and to adjust position in bed. Pt does well maintaining TWB status during sit>sup transfer.  Transfers Overall transfer level: Needs assistance   Transfers: Lateral/Scoot Geophysicist/field seismologist Transfers;Sit to/from Stand Sit to Stand: Mod assist;From elevated surface   Squat pivot transfers: Max assist;+2 safety/equipment;From elevated surface    Lateral/Scoot Transfers: +2 physical assistance;Max assist;+2 safety/equipment;With slide board;From elevated surface(very limited capacity for lateral scoot, ultiately did better with max+2 squat pivot.) General transfer comment: Pt performs lateral scoot to<>from BSC this date. Maintains TWB through RLE consistently.  Ambulation/Gait                 Stairs             Wheelchair Mobility    Modified Rankin (Stroke Patients Only)       Balance Overall balance assessment: Needs assistance;History of Falls Sitting-balance support: Single extremity supported Sitting balance-Leahy Scale: Good Sitting balance - Comments: Steady static and dynamic sitting on BSC with LLE supported and pt maintaining TWB status through RLE. Able to complete peri-care with moderate assist from this author.                                    Cognition Arousal/Alertness: Awake/alert Behavior During Therapy: WFL for tasks assessed/performed Overall Cognitive Status: Within Functional Limits for tasks assessed  General Comments: Pt cognition improved this date, however continues to require increased time for processing and occasional cues for attending to tasks. Pt states she is "feeling so much better" this date and is A&O x4.      Exercises Other Exercises Other Exercises: Pt assisted with toilet transfer and toileting this date. Completes grooming tasks at bed level after toileting complete. See ADL section for detail.    General Comments        Pertinent  Vitals/Pain Pain Assessment: Faces Faces Pain Scale: No hurt Pain Location: RLE Pain Descriptors / Indicators: Sore;Operative site guarding Pain Intervention(s): Limited activity within patient's tolerance;Repositioned;Monitored during session    Home Living                      Prior Function            PT Goals (current goals can now be found in the care plan section) Acute Rehab PT Goals Patient Stated Goal: to go home PT Goal Formulation: With patient Time For Goal Achievement: 02/23/19 Potential to Achieve Goals: Poor Progress towards PT goals: Progressing toward goals    Frequency    BID      PT Plan Current plan remains appropriate    Co-evaluation PT/OT/SLP Co-Evaluation/Treatment: Yes Reason for Co-Treatment: For patient/therapist safety;To address functional/ADL transfers PT goals addressed during session: Proper use of DME;Balance;Mobility/safety with mobility OT goals addressed during session: ADL's and self-care;Proper use of Adaptive equipment and DME      AM-PAC PT "6 Clicks" Mobility   Outcome Measure  Help needed turning from your back to your side while in a flat bed without using bedrails?: A Lot Help needed moving from lying on your back to sitting on the side of a flat bed without using bedrails?: A Lot Help needed moving to and from a bed to a chair (including a wheelchair)?: Total Help needed standing up from a chair using your arms (e.g., wheelchair or bedside chair)?: Total Help needed to walk in hospital room?: Total Help needed climbing 3-5 steps with a railing? : Total 6 Click Score: 8    End of Session Equipment Utilized During Treatment: Gait belt Activity Tolerance: Patient tolerated treatment well;Patient limited by fatigue;No increased pain Patient left: in bed;with call bell/phone within reach;with nursing/sitter in room Nurse Communication: Mobility status(pee on ankle dressing) PT Visit Diagnosis: Muscle weakness  (generalized) (M62.81);History of falling (Z91.81);Difficulty in walking, not elsewhere classified (R26.2);Pain Pain - Right/Left: Right Pain - part of body: Ankle and joints of foot     Time: [id number] PT Time Calculation (min) (ACUTE ONLY): 30 min  Charges:  $Therapeutic Activity: 23-37 mins                     12:21 PM, 02/14/19 Etta Grandchild, PT, DPT Physical Therapist - [facility]  [phone] ([place])    Jeanann Balinski C 02/14/2019, 12:17 PM

## 2019-02-14 NOTE — Progress Notes (Signed)
Cpap was not on pt during shift change. Pt stated she wore all night until 5:30-6am

## 2019-02-14 NOTE — Progress Notes (Signed)
Occupational Therapy Treatment Patient Details Name: Jasmine Morrow MRN: [id number] DOB: 1945-11-23 Today's Date: 02/14/2019    History of present illness Jasmine Morrow is a 64yoF who is admitted for R trimalleolar fx and is now s/p ORIF on 02/08/19. History includes HTN. Pt complains of falling while trying to get into a car and fell on her R side and hit her head. Postoperatively pt is RLE TDWB.   OT comments  Ms. Urness was seen for OT treatment on this date. Upon arrival to room pt seated on EOB with physical therapist present and requesting +2 assist for [facility] transfer. Pt endorsing needing to urinate and agreeable to OT joining session. Pt requires max +2 assist for lateral scoot transfer from EOB to/from [facility] this date. She does well maintaining TWB status through RLE during functional transfers and toileting. Pt requires moderate assist form this therapist to perform toilet hygine due to body habitus and limited ability to perform lateral lean 2/2 TWB status. +2 max/total assist to return to bed, with pt demonstrating improved ability to engage in log-roll technique. Once back to bed pt performs oral care and face washing given set-up assist from this author. See ADL section below for additional details. Overall, pt demonstrates much improved cognition and activity tolerance from past sessions. She is able to attend with minimal prompting throughout and endorses that she feels "so much better" this date. Pt does require consistent cueing for safety throughout session. Pt continues to benefit from skilled OT services to maximize return to PLOF and minimize risk of future falls, injury, caregiver burden, and readmission. Will continue to follow POC as written. Discharge recommendation remains appropriate.   Follow Up Recommendations  SNF    Equipment Recommendations  3 in 1 bedside commode;Other (comment)(Will require [facility].)    Recommendations for Other Services      Precautions /  Restrictions Precautions Precautions: Fall Restrictions Weight Bearing Restrictions: Yes RLE Weight Bearing: Touchdown weight bearing       Mobility Bed Mobility Overal bed mobility: Needs Assistance Bed Mobility: Sit to Supine       Sit to supine: Max assist;+2 for physical assistance      Transfers Overall transfer level: Needs assistance   Transfers: Lateral/Scoot Transfers          Lateral/Scoot Transfers: +2 physical assistance;Max assist;+2 safety/equipment General transfer comment: Pt performs lateral scoot to<>from BSC this date. Maintains TWB through RLE consistently.    Balance Overall balance assessment: Needs assistance;History of Falls Sitting-balance support: Feet supported Sitting balance-Leahy Scale: Good Sitting balance - Comments: Steady static and dynamic sitting on BSC with LLE supported and pt maintaining TWB status through RLE. Able to complete peri-care with moderate assist from this author.                                   ADL either performed or assessed with clinical judgement   ADL Overall ADL's : Needs assistance/impaired     Grooming: Bed level;Oral care;Set up;Independent;Wash/dry face Grooming Details (indicate cue type and reason): Pt performs face washing and oral care after returning to bed from [facility] this date. She requires set-up, but no additional assist from therapist for tasks. Improved cognition and motor planning noted this date.                 Toilet Transfer: +2 for physical assistance;+2 for safety/equipment;BSC;Requires drop arm;Maximal assistance Toilet Transfer Details (  indicate cue type and reason): Pt requires +2 max/total assist to perform lateral scoot transfer to<>from BSC EOB this date. Pt maintains TWB status through RLE during transfer with physical assist form therapist and cueing for safety. Toileting- Clothing Manipulation and Hygiene: Sitting/lateral lean;Moderate assistance;Cueing for  safety         General ADL Comments: Pt continues to be limited by impaired cognition, decreased strength, decreased activity tolerance and RLE pain. She is able to perfrom transfer to [facility] with max/total assist +2 this date. She has some difficulty with motor planning/sequencing but does demonstrate improved cognition this date from prior sessions.     Vision Baseline Vision/History: Wears glasses Wears Glasses: At all times Patient Visual Report: No change from baseline     Perception     Praxis      Cognition Arousal/Alertness: Awake/alert Behavior During Therapy: WFL for tasks assessed/performed Overall Cognitive Status: Within Functional Limits for tasks assessed                                 General Comments: Pt cognition improved this date, however continues to require increased time for processing and occasional cues for attending to tasks. Pt states she is "feeling so much better" this date and is A&O x4.        Exercises Other Exercises Other Exercises: Pt assisted with toilet transfer and toileting this date. Completes grooming tasks at bed level after toileting complete. See ADL section for detail.   Shoulder Instructions       General Comments      Pertinent Vitals/ Pain       Pain Assessment: Faces Faces Pain Scale: Hurts a little bit Pain Location: RLE Pain Descriptors / Indicators: Sore;Operative site guarding Pain Intervention(s): Limited activity within patient's tolerance;Repositioned;Monitored during session  Home Living                                          Prior Functioning/Environment              Frequency  Min 2X/week        Progress Toward Goals  OT Goals(current goals can now be found in the care plan section)  Progress towards OT goals: Progressing toward goals  Acute Rehab OT Goals Patient Stated Goal: to go home OT Goal Formulation: With patient Time For Goal Achievement: 02/26/19   Plan Discharge plan remains appropriate;Frequency remains appropriate    Co-evaluation                 AM-PAC OT "6 Clicks" Daily Activity     Outcome Measure   Help from another person eating meals?: None Help from another person taking care of personal grooming?: A Little Help from another person toileting, which includes using toliet, bedpan, or urinal?: A Lot Help from another person bathing (including washing, rinsing, drying)?: A Lot Help from another person to put on and taking off regular upper body clothing?: A Little Help from another person to put on and taking off regular lower body clothing?: A Lot 6 Click Score: 16    End of Session Equipment Utilized During Treatment: Gait belt  OT Visit Diagnosis: Other abnormalities of gait and mobility (R26.89);History of falling (Z91.81);Pain Pain - Right/Left: Right Pain - part of body: Leg;Ankle and joints of foot   Activity Tolerance  Patient tolerated treatment well;No increased pain   Patient Left in bed;with call bell/phone within reach;with bed alarm set   Nurse Communication          Time: [id number] OT Time Calculation (min): 25 min  Charges: OT General Charges $OT Visit: 1 Visit OT Treatments $Self Care/Home Management : 23-37 mins  Shara Blazing, M.S., OTR/L Ascom: [phone] 02/14/19, 11:45 AM

## 2019-02-20 ENCOUNTER — Inpatient Hospital Stay
Admission: EM | Admit: 2019-02-20 | Discharge: 2019-03-07 | DRG: 871 | Disposition: A | Discharge status: Skilled Nursing Facility | Payer: Medicare Other | Attending: Internal Medicine | Admitting: Internal Medicine

## 2019-02-20 ENCOUNTER — Other Ambulatory Visit: Payer: Medicare Other

## 2019-02-20 ENCOUNTER — Emergency Department: Payer: Medicare Other

## 2019-02-20 ENCOUNTER — Inpatient Hospital Stay: Payer: Medicare Other

## 2019-02-20 ENCOUNTER — Encounter: Payer: Self-pay | Admitting: Emergency Medicine

## 2019-02-20 ENCOUNTER — Other Ambulatory Visit: Payer: Self-pay

## 2019-02-20 DIAGNOSIS — B9561 Methicillin susceptible Staphylococcus aureus infection as the cause of diseases classified elsewhere: Secondary | ICD-10-CM | POA: Diagnosis not present

## 2019-02-20 DIAGNOSIS — Z91048 Other nonmedicinal substance allergy status: Secondary | ICD-10-CM

## 2019-02-20 DIAGNOSIS — Z79891 Long term (current) use of opiate analgesic: Secondary | ICD-10-CM

## 2019-02-20 DIAGNOSIS — Z20822 Contact with and (suspected) exposure to covid-19: Secondary | ICD-10-CM | POA: Diagnosis present

## 2019-02-20 DIAGNOSIS — E872 Acidosis: Secondary | ICD-10-CM | POA: Diagnosis present

## 2019-02-20 DIAGNOSIS — L8990 Pressure ulcer of unspecified site, unspecified stage: Secondary | ICD-10-CM | POA: Insufficient documentation

## 2019-02-20 DIAGNOSIS — D72823 Leukemoid reaction: Secondary | ICD-10-CM | POA: Diagnosis not present

## 2019-02-20 DIAGNOSIS — R6521 Severe sepsis with septic shock: Secondary | ICD-10-CM | POA: Diagnosis present

## 2019-02-20 DIAGNOSIS — J9690 Respiratory failure, unspecified, unspecified whether with hypoxia or hypercapnia: Secondary | ICD-10-CM | POA: Diagnosis not present

## 2019-02-20 DIAGNOSIS — R918 Other nonspecific abnormal finding of lung field: Secondary | ICD-10-CM | POA: Diagnosis present

## 2019-02-20 DIAGNOSIS — E785 Hyperlipidemia, unspecified: Secondary | ICD-10-CM | POA: Diagnosis present

## 2019-02-20 DIAGNOSIS — G9341 Metabolic encephalopathy: Secondary | ICD-10-CM | POA: Diagnosis present

## 2019-02-20 DIAGNOSIS — Z886 Allergy status to analgesic agent status: Secondary | ICD-10-CM

## 2019-02-20 DIAGNOSIS — E876 Hypokalemia: Secondary | ICD-10-CM | POA: Diagnosis not present

## 2019-02-20 DIAGNOSIS — Z79899 Other long term (current) drug therapy: Secondary | ICD-10-CM

## 2019-02-20 DIAGNOSIS — A4101 Sepsis due to Methicillin susceptible Staphylococcus aureus: Secondary | ICD-10-CM | POA: Diagnosis present

## 2019-02-20 DIAGNOSIS — X58XXXA Exposure to other specified factors, initial encounter: Secondary | ICD-10-CM | POA: Diagnosis not present

## 2019-02-20 DIAGNOSIS — G4733 Obstructive sleep apnea (adult) (pediatric): Secondary | ICD-10-CM | POA: Diagnosis present

## 2019-02-20 DIAGNOSIS — Z7982 Long term (current) use of aspirin: Secondary | ICD-10-CM

## 2019-02-20 DIAGNOSIS — Z967 Presence of other bone and tendon implants: Secondary | ICD-10-CM | POA: Diagnosis not present

## 2019-02-20 DIAGNOSIS — D649 Anemia, unspecified: Secondary | ICD-10-CM | POA: Diagnosis not present

## 2019-02-20 DIAGNOSIS — X58XXXD Exposure to other specified factors, subsequent encounter: Secondary | ICD-10-CM

## 2019-02-20 DIAGNOSIS — Z933 Colostomy status: Secondary | ICD-10-CM | POA: Diagnosis not present

## 2019-02-20 DIAGNOSIS — E861 Hypovolemia: Secondary | ICD-10-CM | POA: Diagnosis not present

## 2019-02-20 DIAGNOSIS — Z888 Allergy status to other drugs, medicaments and biological substances status: Secondary | ICD-10-CM

## 2019-02-20 DIAGNOSIS — R197 Diarrhea, unspecified: Secondary | ICD-10-CM

## 2019-02-20 DIAGNOSIS — D72829 Elevated white blood cell count, unspecified: Secondary | ICD-10-CM | POA: Diagnosis not present

## 2019-02-20 DIAGNOSIS — J302 Other seasonal allergic rhinitis: Secondary | ICD-10-CM | POA: Diagnosis present

## 2019-02-20 DIAGNOSIS — D473 Essential (hemorrhagic) thrombocythemia: Secondary | ICD-10-CM | POA: Diagnosis not present

## 2019-02-20 DIAGNOSIS — K5900 Constipation, unspecified: Secondary | ICD-10-CM | POA: Diagnosis not present

## 2019-02-20 DIAGNOSIS — K449 Diaphragmatic hernia without obstruction or gangrene: Secondary | ICD-10-CM | POA: Diagnosis present

## 2019-02-20 DIAGNOSIS — J45998 Other asthma: Secondary | ICD-10-CM | POA: Diagnosis present

## 2019-02-20 DIAGNOSIS — N179 Acute kidney failure, unspecified: Secondary | ICD-10-CM

## 2019-02-20 DIAGNOSIS — Z9049 Acquired absence of other specified parts of digestive tract: Secondary | ICD-10-CM

## 2019-02-20 DIAGNOSIS — Z9889 Other specified postprocedural states: Secondary | ICD-10-CM | POA: Diagnosis not present

## 2019-02-20 DIAGNOSIS — K625 Hemorrhage of anus and rectum: Secondary | ICD-10-CM | POA: Diagnosis not present

## 2019-02-20 DIAGNOSIS — R5383 Other fatigue: Secondary | ICD-10-CM | POA: Diagnosis not present

## 2019-02-20 DIAGNOSIS — S90911A Unspecified superficial injury of right ankle, initial encounter: Secondary | ICD-10-CM | POA: Diagnosis not present

## 2019-02-20 DIAGNOSIS — R7881 Bacteremia: Secondary | ICD-10-CM | POA: Diagnosis not present

## 2019-02-20 DIAGNOSIS — I10 Essential (primary) hypertension: Secondary | ICD-10-CM

## 2019-02-20 DIAGNOSIS — L89152 Pressure ulcer of sacral region, stage 2: Secondary | ICD-10-CM | POA: Diagnosis not present

## 2019-02-20 DIAGNOSIS — R030 Elevated blood-pressure reading, without diagnosis of hypertension: Secondary | ICD-10-CM | POA: Diagnosis present

## 2019-02-20 DIAGNOSIS — W1839XD Other fall on same level, subsequent encounter: Secondary | ICD-10-CM | POA: Diagnosis not present

## 2019-02-20 DIAGNOSIS — K259 Gastric ulcer, unspecified as acute or chronic, without hemorrhage or perforation: Secondary | ICD-10-CM | POA: Diagnosis present

## 2019-02-20 DIAGNOSIS — Z87891 Personal history of nicotine dependence: Secondary | ICD-10-CM

## 2019-02-20 DIAGNOSIS — D62 Acute posthemorrhagic anemia: Secondary | ICD-10-CM | POA: Diagnosis present

## 2019-02-20 DIAGNOSIS — W19XXXD Unspecified fall, subsequent encounter: Secondary | ICD-10-CM | POA: Diagnosis not present

## 2019-02-20 DIAGNOSIS — Z825 Family history of asthma and other chronic lower respiratory diseases: Secondary | ICD-10-CM

## 2019-02-20 DIAGNOSIS — R578 Other shock: Secondary | ICD-10-CM | POA: Diagnosis not present

## 2019-02-20 DIAGNOSIS — S82891D Other fracture of right lower leg, subsequent encounter for closed fracture with routine healing: Secondary | ICD-10-CM

## 2019-02-20 DIAGNOSIS — K5731 Diverticulosis of large intestine without perforation or abscess with bleeding: Secondary | ICD-10-CM | POA: Diagnosis present

## 2019-02-20 DIAGNOSIS — K921 Melena: Secondary | ICD-10-CM | POA: Diagnosis not present

## 2019-02-20 DIAGNOSIS — Z6841 Body Mass Index (BMI) 40.0 and over, adult: Secondary | ICD-10-CM | POA: Diagnosis not present

## 2019-02-20 DIAGNOSIS — B9562 Methicillin resistant Staphylococcus aureus infection as the cause of diseases classified elsewhere: Secondary | ICD-10-CM | POA: Diagnosis not present

## 2019-02-20 DIAGNOSIS — K922 Gastrointestinal hemorrhage, unspecified: Secondary | ICD-10-CM | POA: Diagnosis not present

## 2019-02-20 DIAGNOSIS — R0602 Shortness of breath: Secondary | ICD-10-CM

## 2019-02-20 DIAGNOSIS — A419 Sepsis, unspecified organism: Secondary | ICD-10-CM

## 2019-02-20 DIAGNOSIS — R579 Shock, unspecified: Secondary | ICD-10-CM

## 2019-02-20 DIAGNOSIS — Z882 Allergy status to sulfonamides status: Secondary | ICD-10-CM

## 2019-02-20 DIAGNOSIS — Z96653 Presence of artificial knee joint, bilateral: Secondary | ICD-10-CM | POA: Diagnosis present

## 2019-02-20 DIAGNOSIS — Z885 Allergy status to narcotic agent status: Secondary | ICD-10-CM

## 2019-02-20 DIAGNOSIS — S80921D Unspecified superficial injury of right lower leg, subsequent encounter: Secondary | ICD-10-CM | POA: Diagnosis not present

## 2019-02-20 DIAGNOSIS — Z881 Allergy status to other antibiotic agents status: Secondary | ICD-10-CM

## 2019-02-20 DIAGNOSIS — K2970 Gastritis, unspecified, without bleeding: Secondary | ICD-10-CM | POA: Diagnosis present

## 2019-02-20 DIAGNOSIS — M545 Low back pain: Secondary | ICD-10-CM | POA: Diagnosis not present

## 2019-02-20 DIAGNOSIS — S80921A Unspecified superficial injury of right lower leg, initial encounter: Secondary | ICD-10-CM | POA: Diagnosis not present

## 2019-02-20 LAB — CBC WITH DIFFERENTIAL/PLATELET
Abs Immature Granulocytes: 2.19 10*3/uL — ABNORMAL HIGH (ref 0.00–0.07)
Basophils Absolute: 0.1 10*3/uL (ref 0.0–0.1)
Basophils Relative: 0 %
Eosinophils Absolute: 0 10*3/uL (ref 0.0–0.5)
Eosinophils Relative: 0 %
HCT: 18.1 % — ABNORMAL LOW (ref 36.0–46.0)
Hemoglobin: 5.6 g/dL — ABNORMAL LOW (ref 12.0–15.0)
Immature Granulocytes: 5 %
Lymphocytes Relative: 5 %
Lymphs Abs: 2.4 10*3/uL (ref 0.7–4.0)
MCH: 27.7 pg (ref 26.0–34.0)
MCHC: 30.9 g/dL (ref 30.0–36.0)
MCV: 89.6 fL (ref 80.0–100.0)
Monocytes Absolute: 2.7 10*3/uL — ABNORMAL HIGH (ref 0.1–1.0)
Monocytes Relative: 6 %
Neutro Abs: 40.6 10*3/uL — ABNORMAL HIGH (ref 1.7–7.7)
Neutrophils Relative %: 84 %
Platelets: 1009 10*3/uL (ref 150–400)
RBC: 2.02 MIL/uL — ABNORMAL LOW (ref 3.87–5.11)
RDW: 20.5 % — ABNORMAL HIGH (ref 11.5–15.5)
Smear Review: NORMAL
WBC: 48 10*3/uL — ABNORMAL HIGH (ref 4.0–10.5)
nRBC: 3 % — ABNORMAL HIGH (ref 0.0–0.2)

## 2019-02-20 LAB — COMPREHENSIVE METABOLIC PANEL
ALT: 18 U/L (ref 0–44)
AST: 26 U/L (ref 15–41)
Albumin: 2.2 g/dL — ABNORMAL LOW (ref 3.5–5.0)
Alkaline Phosphatase: 107 U/L (ref 38–126)
Anion gap: 18 — ABNORMAL HIGH (ref 5–15)
BUN: 99 mg/dL — ABNORMAL HIGH (ref 8–23)
CO2: 17 mmol/L — ABNORMAL LOW (ref 22–32)
Calcium: 9.4 mg/dL (ref 8.9–10.3)
Chloride: 101 mmol/L (ref 98–111)
Creatinine, Ser: 1.92 mg/dL — ABNORMAL HIGH (ref 0.44–1.00)
GFR calc Af Amer: 30 mL/min — ABNORMAL LOW (ref >60)
GFR calc non Af Amer: 26 mL/min — ABNORMAL LOW (ref >60)
Glucose, Bld: 135 mg/dL — ABNORMAL HIGH (ref 70–99)
Potassium: 3.4 mmol/L — ABNORMAL LOW (ref 3.5–5.1)
Sodium: 136 mmol/L (ref 135–145)
Total Bilirubin: 0.8 mg/dL (ref 0.3–1.2)
Total Protein: 6.1 g/dL — ABNORMAL LOW (ref 6.5–8.1)

## 2019-02-20 LAB — PREPARE RBC (CROSSMATCH)

## 2019-02-20 LAB — LACTIC ACID, PLASMA
Lactic Acid, Venous: 2.4 mmol/L (ref 0.5–1.9)
Lactic Acid, Venous: 5.1 mmol/L (ref 0.5–1.9)

## 2019-02-20 LAB — LACTATE DEHYDROGENASE: LDH: 184 U/L (ref 98–192)

## 2019-02-20 LAB — IRON AND TIBC
Iron: 37 ug/dL (ref 28–170)
Saturation Ratios: 9 % — ABNORMAL LOW (ref 10.4–31.8)
TIBC: 407 ug/dL (ref 250–450)
UIBC: 370 ug/dL

## 2019-02-20 LAB — GLUCOSE, CAPILLARY: Glucose-Capillary: 127 mg/dL — ABNORMAL HIGH (ref 70–99)

## 2019-02-20 LAB — LIPASE, BLOOD: Lipase: 66 U/L — ABNORMAL HIGH (ref 11–51)

## 2019-02-20 LAB — C DIFFICILE QUICK SCREEN W PCR REFLEX
C Diff antigen: POSITIVE — AB
C Diff toxin: NEGATIVE

## 2019-02-20 LAB — RESPIRATORY PANEL BY RT PCR (FLU A&B, COVID)
Influenza A by PCR: NEGATIVE
Influenza B by PCR: NEGATIVE
SARS Coronavirus 2 by RT PCR: NEGATIVE

## 2019-02-20 LAB — FERRITIN: Ferritin: 116 ng/mL (ref 11–307)

## 2019-02-20 LAB — TROPONIN I (HIGH SENSITIVITY): Troponin I (High Sensitivity): 35 ng/L — ABNORMAL HIGH (ref <18)

## 2019-02-20 LAB — CLOSTRIDIUM DIFFICILE BY PCR, REFLEXED: Toxigenic C. Difficile by PCR: NEGATIVE

## 2019-02-20 LAB — ABO/RH: ABO/RH(D): O POS

## 2019-02-20 LAB — BRAIN NATRIURETIC PEPTIDE: B Natriuretic Peptide: 86 pg/mL (ref 0.0–100.0)

## 2019-02-20 MED ORDER — PANTOPRAZOLE SODIUM 40 MG IV SOLR: 40.0000 mg | Freq: Two times a day (BID) | INTRAVENOUS | Status: DC

## 2019-02-20 MED ORDER — CALCIUM CARBONATE-VITAMIN D 500-200 MG-UNIT PO TABS
2.0000 | ORAL_TABLET | Freq: Every day | ORAL | Status: DC
  Administered 2019-02-21 – 2019-03-07 (×11): 2 via ORAL
  Filled 2019-02-20 (×12): qty 2

## 2019-02-20 MED ORDER — LACTATED RINGERS IV BOLUS (SEPSIS)
1000.0000 mL | Freq: Once | INTRAVENOUS | Status: AC
  Administered 2019-02-20: 1000 mL via INTRAVENOUS

## 2019-02-20 MED ORDER — FENTANYL CITRATE (PF) 100 MCG/2ML IJ SOLN
50.0000 ug | Freq: Once | INTRAMUSCULAR | Status: AC
  Administered 2019-02-20: 50 ug via INTRAVENOUS
  Filled 2019-02-20: qty 2

## 2019-02-20 MED ORDER — SODIUM CHLORIDE 0.9 % IV SOLN: 2.0000 g | Freq: Once | INTRAVENOUS | Status: DC

## 2019-02-20 MED ORDER — VANCOMYCIN HCL IN DEXTROSE 1-5 GM/200ML-% IV SOLN
1000.0000 mg | Freq: Once | INTRAVENOUS | Status: AC
  Administered 2019-02-20: 1000 mg via INTRAVENOUS

## 2019-02-20 MED ORDER — SODIUM CHLORIDE 0.9 % IV SOLN
10.0000 mL/h | Freq: Once | INTRAVENOUS | Status: AC
  Administered 2019-02-20: 10 mL/h via INTRAVENOUS

## 2019-02-20 MED ORDER — CHLORHEXIDINE GLUCONATE CLOTH 2 % EX PADS
6.0000 | MEDICATED_PAD | Freq: Every day | CUTANEOUS | Status: DC
  Administered 2019-02-22 – 2019-03-07 (×12): 6 via TOPICAL

## 2019-02-20 MED ORDER — SODIUM CHLORIDE 0.9 % IV BOLUS
1000.0000 mL | Freq: Once | INTRAVENOUS | Status: AC
  Administered 2019-02-20: 1000 mL via INTRAVENOUS

## 2019-02-20 MED ORDER — METRONIDAZOLE IN NACL 5-0.79 MG/ML-% IV SOLN
500.0000 mg | Freq: Once | INTRAVENOUS | Status: DC
  Filled 2019-02-20: qty 100

## 2019-02-20 MED ORDER — METRONIDAZOLE IN NACL 5-0.79 MG/ML-% IV SOLN
500.0000 mg | Freq: Three times a day (TID) | INTRAVENOUS | Status: DC
  Administered 2019-02-20 – 2019-02-21 (×3): 500 mg via INTRAVENOUS
  Filled 2019-02-20 (×4): qty 100

## 2019-02-20 MED ORDER — VANCOMYCIN HCL IN DEXTROSE 1-5 GM/200ML-% IV SOLN
1000.0000 mg | Freq: Once | INTRAVENOUS | Status: AC
  Administered 2019-02-20: 1000 mg via INTRAVENOUS
  Filled 2019-02-20: qty 200

## 2019-02-20 MED ORDER — BISACODYL 10 MG RE SUPP
10.0000 mg | Freq: Every day | RECTAL | Status: DC | PRN
  Filled 2019-02-20: qty 1

## 2019-02-20 MED ORDER — LORATADINE 10 MG PO TABS
10.0000 mg | ORAL_TABLET | Freq: Every day | ORAL | Status: DC
  Administered 2019-02-21 – 2019-03-07 (×11): 10 mg via ORAL
  Filled 2019-02-20 (×11): qty 1

## 2019-02-20 MED ORDER — VANCOMYCIN HCL 1500 MG/300ML IV SOLN
1500.0000 mg | Freq: Once | INTRAVENOUS | Status: DC
  Filled 2019-02-20: qty 300

## 2019-02-20 MED ORDER — ONDANSETRON HCL 4 MG PO TABS: 8.0000 mg | ORAL_TABLET | Freq: Three times a day (TID) | ORAL | Status: DC | PRN

## 2019-02-20 MED ORDER — VANCOMYCIN HCL IN DEXTROSE 1-5 GM/200ML-% IV SOLN
1000.0000 mg | Freq: Once | INTRAVENOUS | Status: DC
  Filled 2019-02-20: qty 200

## 2019-02-20 MED ORDER — SODIUM CHLORIDE 0.9 % IV SOLN
80.0000 mg | Freq: Once | INTRAVENOUS | Status: AC
  Administered 2019-02-20: 80 mg via INTRAVENOUS
  Filled 2019-02-20: qty 80

## 2019-02-20 MED ORDER — SODIUM CHLORIDE 0.9 % IV SOLN
2.0000 g | Freq: Once | INTRAVENOUS | Status: AC
  Administered 2019-02-20: 2 g via INTRAVENOUS
  Filled 2019-02-20: qty 2

## 2019-02-20 MED ORDER — SODIUM CHLORIDE 0.9 % IV SOLN
8.0000 mg/h | INTRAVENOUS | Status: DC
  Administered 2019-02-20 – 2019-02-23 (×5): 8 mg/h via INTRAVENOUS
  Filled 2019-02-20 (×8): qty 80

## 2019-02-20 MED ORDER — ASPIRIN 81 MG PO CHEW
324.0000 mg | CHEWABLE_TABLET | Freq: Once | ORAL | Status: AC
  Administered 2019-02-20: 324 mg via ORAL
  Filled 2019-02-20: qty 4

## 2019-02-20 MED ORDER — AZITHROMYCIN 500 MG PO TABS: 250.0000 mg | ORAL_TABLET | Freq: Every day | ORAL | Status: DC

## 2019-02-20 MED ORDER — SODIUM CHLORIDE 0.9 % IV SOLN
0.0000 ug/min | INTRAVENOUS | Status: DC
  Administered 2019-02-20: 20 ug/min via INTRAVENOUS
  Administered 2019-02-21 (×2): 40 ug/min via INTRAVENOUS
  Administered 2019-02-21: 30 ug/min via INTRAVENOUS
  Administered 2019-02-21: 20 ug/min via INTRAVENOUS
  Administered 2019-02-21: 40 ug/min via INTRAVENOUS
  Administered 2019-02-22 (×2): 50 ug/min via INTRAVENOUS
  Filled 2019-02-20: qty 1
  Filled 2019-02-20 (×2): qty 10
  Filled 2019-02-20: qty 1
  Filled 2019-02-20 (×3): qty 10

## 2019-02-20 MED ORDER — HYPROMELLOSE (GONIOSCOPIC) 2.5 % OP SOLN
1.0000 [drp] | Freq: Three times a day (TID) | OPHTHALMIC | Status: DC | PRN
  Filled 2019-02-20: qty 15

## 2019-02-20 MED ORDER — LACTATED RINGERS IV BOLUS (SEPSIS): 500.0000 mL | Freq: Once | INTRAVENOUS | Status: DC

## 2019-02-20 MED ORDER — SODIUM CHLORIDE 0.9 % IV SOLN
2.0000 g | Freq: Two times a day (BID) | INTRAVENOUS | Status: DC
  Filled 2019-02-20 (×2): qty 2

## 2019-02-20 MED ORDER — ONDANSETRON HCL 4 MG/2ML IJ SOLN
4.0000 mg | Freq: Once | INTRAMUSCULAR | Status: AC
  Administered 2019-02-20: 4 mg via INTRAVENOUS
  Filled 2019-02-20: qty 2

## 2019-02-20 NOTE — Sepsis Progress Note (Signed)
Notified bedside nurse of need to draw repeat lactic acid. 

## 2019-02-20 NOTE — Consult Note (Addendum)
Jonathon Bellows , MD [address] [place], [place], [place], [id number], [place], [place], [place], [address] Phone: [phone]  Fax: [phone]  Consultation  Referring Provider:   Dr Jari Pigg Primary Care Physician:  Derinda Late, MD Primary Gastroenterologist:  None          Reason for Consultation:     Melena   Date of Admission:  02/20/2019 Date of Consultation:  02/20/2019         HPI:   JAYNEE BUSHARD is a 74 y.o. female presented to the emergency room earlier today with chest pain.  Recent ankle fracture.  When EMS went to see the patient had saturations in the 80s and 90s, short of breath.  Not on any blood thinners presently but was previously on.  Past medications indicates she has been on aspirin 325 mg, omeprazole in 2017.  In the emergency room hemoglobin was 5.6 and melena noted on exam.  White cell count of 48,000 and platelet count of over thousand.  Being treated for sepsis. Chest x-ray is normal.  VQ scan has been ordered.  Hemoglobin 7.5 g 12 days back with an MCV of 82.3 Covid testing negative  The patient denies any abdominal pain, hematemesis, difficulty breathing at this time.  No specific complaints per the patient.  She does complain of a lot of gas the last few days.  She denies any melena but I did discuss with Dr. Jari Pigg and she did note that the last bowel movement in the ER was black in color.  The patient denies any NSAID use or any blood thinner usage.  She does recall taking a few 81 mg aspirins.  During my interview with her she was drowsy and I had to wake her up a few times to get a full history.   Past Medical History:  Diagnosis Date  . Hypertension   . Seasonal allergies     Past Surgical History:  Procedure Laterality Date  . CESAREAN SECTION    . DILATION AND CURETTAGE OF UTERUS    . ORIF ANKLE FRACTURE Right 02/08/2019   Procedure: OPEN REDUCTION INTERNAL FIXATION (ORIF) ANKLE FRACTURE, post malleolus;  Surgeon: Hessie Knows, MD;  Location: [facility] ORS;  Service: Orthopedics;  Laterality: Right;  . REPLACEMENT TOTAL KNEE Bilateral 2008   2009  . SYNDESMOSIS REPAIR Right 02/08/2019   Procedure: SYNDESMOSIS REPAIR;  Surgeon: Hessie Knows, MD;  Location: [facility] ORS;  Service: Orthopedics;  Laterality: Right;  . TUMOR REMOVAL      Prior to Admission medications   Medication Sig Start Date End Date Taking? Authorizing Provider  acetaminophen (TYLENOL) 500 MG tablet Take 1,000 mg by mouth every 6 (six) hours as needed for mild pain or headache.    Yes [provider]  albuterol (PROVENTIL HFA;VENTOLIN HFA) 108 (90 Base) MCG/ACT inhaler Inhale 2 puffs into the lungs every 4 (four) hours as needed. Patient taking differently: Inhale 2 puffs into the lungs every 4 (four) hours as needed for wheezing or shortness of breath.  05/09/16  Yes Wilhelmina Mcardle, MD  aspirin EC 325 MG tablet Take 1 tablet (325 mg total) by mouth daily. Patient taking differently: Take 325 mg by mouth 2 (two) times daily.  02/13/19  Yes Duanne Guess, PA-C  azithromycin (ZITHROMAX) 250 MG tablet Take 250 mg by mouth daily. 02/15/19 02/20/19 Yes [provider]  bisacodyl (DULCOLAX) 10 MG suppository Place 1 suppository (10 mg total) rectally daily as needed  for moderate constipation. 02/10/19  Yes Duanne Guess, PA-C  Calcium-Vitamin D 600-200 MG-UNIT per tablet Take 2 tablets by mouth daily.    Yes [provider]  carvedilol (COREG) 3.125 MG tablet Take 3.125 mg by mouth 2 (two) times daily. 12/07/18  Yes [provider]  cetirizine (ZYRTEC) 5 MG tablet Take 10 mg by mouth daily.    Yes [provider]  cyclobenzaprine (FLEXERIL) 10 MG tablet Take 10 mg by mouth daily as needed for muscle spasms.   Yes [provider]  fluocinonide cream (LIDEX) AB-123456789 % Apply 1 application topically 3 (three) times daily as needed (mouth sores). Apply to mouth lesions as needed 01/17/19  Yes [provider]  hydroxypropyl methylcellulose / hypromellose (ISOPTO TEARS / GONIOVISC) 2.5 % ophthalmic solution Place 1 drop into both eyes every 8 (eight) hours as needed for dry eyes.    Yes [provider]  Multiple Vitamin (MULTIVITAMIN WITH MINERALS) TABS tablet Take 1 tablet by mouth daily.   Yes [provider]  Multiple Vitamins-Minerals (PRESERVISION AREDS 2 PO) Take 1 tablet by mouth 2 (two) times daily.   Yes [provider]  omeprazole (PRILOSEC) 20 MG capsule Take 20 mg by mouth 2 (two) times daily as needed (Heartburn).  04/23/15  Yes [provider]  ondansetron (ZOFRAN) 8 MG tablet Take 1 tablet (8 mg total) by mouth every 8 (eight) hours as needed for nausea or vomiting. 02/06/19  Yes Sable Feil, PA-C  traMADol (ULTRAM) 50 MG tablet Take 1-2 tablets (50-100 mg total) by mouth every 6 (six) hours as needed for severe pain. 02/10/19  Yes Duanne Guess, PA-C  triamterene-hydrochlorothiazide (MAXZIDE-25) 37.5-25 MG tablet Take 1 tablet by mouth daily. 03/21/18  Yes [provider]    Family History  Problem Relation Age of Onset  . Bone cancer Mother   . COPD Father      Social History   Tobacco Use  . Smoking status: Former Smoker    Packs/day: 2.00    Years: 30.00    Pack years: 60.00    Types: Cigarettes    Quit date: 10/02/1990    Years since quitting: 28.4  . Smokeless tobacco: Never Used  Substance Use Topics  . Alcohol use: No    Alcohol/week: 0.0 standard drinks  . Drug use: No    Allergies as of 02/20/2019 - Review Complete 02/20/2019  Allergen Reaction Noted  . Celecoxib Nausea Only 03/27/2014  . Hydrocodone-acetaminophen Nausea And Vomiting 03/27/2014  . Meloxicam Nausea Only 03/27/2014  . Morphine sulfate er beads Itching 03/27/2014  . Naproxen Swelling 03/27/2014  . Rofecoxib Nausea Only 03/27/2014  . Sulfa antibiotics Other (See Comments) 02/07/2019  . Ciprofloxacin Rash 02/07/2019  . Nickel Rash  03/27/2014  . Tramadol-acetaminophen Nausea Only and Rash 03/27/2014    Review of Systems:    All systems reviewed and negative except where noted in HPI.   Physical Exam:  Vital signs in last 24 hours: Temp:  [97.7 F (36.5 C)] 97.7 F (36.5 C) (01/20 0951) Pulse Rate:  [94] 94 (01/20 0951) Resp:  [18-20] 20 (01/20 1228) BP: (99-108)/(57-95) 104/88 (01/20 1228) SpO2:  [100 %] 100 % (01/20 1228)   General:   Drowsy, eyes closed able to be woken up. Head:  Normocephalic and atraumatic. Eyes:   No icterus.   Conjunctiva pink. PERRLA. Ears:  Normal auditory acuity. Neck:  Supple; no masses or thyroidomegaly Lungs: Respirations even and unlabored. Lungs clear to  auscultation bilaterally.   No wheezes, crackles, or rhonchi.  Heart:  Regular rate and rhythm;  Without murmur, clicks, rubs or gallops Abdomen:  Soft, nondistended, nontender. Normal bowel sounds. No appreciable masses or hepatomegaly.  No rebound or guarding.  Neurologic:  Alert and oriented x3;  grossly normal neurologically.  Drowsy at times Skin:  Intact without significant lesions or rashes. Cervical Nodes:  No significant cervical adenopathy. Psych: Drowsy but answers questions adequately moving all 4 limbs. Right ankle in bandage  LAB RESULTS: Recent Labs    02/20/19 0955  WBC 48.0*  HGB 5.6*  HCT 18.1*  PLT 1,009*   BMET Recent Labs    02/20/19 0955  NA 136  K 3.4*  CL 101  CO2 17*  GLUCOSE 135*  BUN 99*  CREATININE 1.92*  CALCIUM 9.4   LFT Recent Labs    02/20/19 0955  PROT 6.1*  ALBUMIN 2.2*  AST 26  ALT 18  ALKPHOS 107  BILITOT 0.8   PT/INR No results for input(s): LABPROT, INR in the last 72 hours.  STUDIES: DG Chest Port 1 View  Result Date: 02/20/2019 CLINICAL DATA:  Chest pressure EXAM: PORTABLE CHEST 1 VIEW COMPARISON:  February 12, 2019 FINDINGS: Lungs are clear. Heart size and pulmonary vascularity are normal. No adenopathy. No pneumothorax. No bone lesions. IMPRESSION:  Lungs clear.  Cardiac silhouette within normal limits. Electronically Signed   By: Lowella Grip III M.D.   On: 02/20/2019 12:16      Impression / Plan:   SHANDELL RUHLAND is a 74 y.o. y/o female with a recent history of ankle fracture presents to the emergency room with chest pain nausea, shortness of breath found to be anemic and melena on exam.  White cell count close to 50,000 and a platelet count over thousand.  Hypotensive, AKI.  Likely combination of sepsis and anemia which is likely acute.  Per history not on any NSAIDs but her past medication includes 325 mg of aspirin.  In terms of sepsis Clostridium difficile diarrhea is very likely.  The melena is probably unrelated and could be related to an upper GI bleed.  Plan 1.  She would need resuscitation with fluids and PRBCs.  Source of sepsis needs to be evaluated.  Infectious diseases is on board.  Agree need to rule out C. difficile colitis and probably in the interim empirically treat with antibiotics as well.  It would include oral vancomycin and Flagyl.Dr Delaine Lame is going to see the patient. CT abdomen being planned to rule out intraabdominal source of sepsis/colitis.  When hemodynamically stable would need an upper endoscopy.    If she has significant overt bleeding before we can stabilize her for endoscopy then would require either a CT angiogram or a tagged RBC scan to localize the source of bleeding as well as to embolize if needed.  2.  IV PPI gtt.  Thank you for involving me in the care of this patient.      LOS: 0 days   Jonathon Bellows, MD  02/20/2019, 1:12 PM

## 2019-02-20 NOTE — ED Notes (Signed)
Pt transported to CT ?

## 2019-02-20 NOTE — Consult Note (Signed)
Name: Jasmine Morrow MRN: [id number] DOB: 1945/05/16    ADMISSION DATE:  02/20/2019 CONSULTATION DATE:  02/20/2019  REFERRING MD :  Dr. Manuella Ghazi  CHIEF COMPLAINT:  Chest pain  BRIEF PATIENT DESCRIPTION:  74 y.o. Female admitted 02/20/19 with Hemorrhagic shock in setting of Acute blood loss anemia due to GI bleed, along with questionable sepsis/septic shock of unknown etiology.  GI and ID following.  SIGNIFICANT EVENTS  1/20- Admission to Stepdown 1/20- GI, ID, PCCM consulted  STUDIES:  1/20- CXR>> Lungs clear.  Cardiac silhouette within normal limits. 1/20- CT Abdomen & Pelvis w/o contrast>> 1. No CT evidence for acute intra-abdominal or intrapelvic abnormality. 2. Sigmoid colon diverticular disease without acute inflammatory change. 3. Nonvisualized gallbladder presumably due to prior cholecystectomy. Prominent extrahepatic bile duct which may be secondary to postsurgical change though suggest correlation with LFTs.  CULTURES: SARS-CoV-2 PCR 1/20>> Negative Influenza PCR 1/20>> Negative Blood cultures x2 1/20>> Urine 1/20>> Stool 1/20>> C. Diff antigen +, C. Diff toxin - (results indeterminate)  ANTIBIOTICS: Cefepime 1/20>> Flagyl 1/20>> Vancomycin 1/20>>  HISTORY OF PRESENT ILLNESS:   Jasmine Morrow is a 74 year old female with a past medical history notable for hypertension and recent right ankle fracture with ORIF who presented to [facility] ED on 02/20/2019 with complaints of chest pain.  EMS was picking her up today for her Orthopedic appointment when she began to complain of chest discomfort, and was noted to be hypoxic with O2 sats ranging from the 80's to 90's.   Upon presentation to the ED her BP was soft with BP 104/57, HR 94, afebrile.  Initial work-up in the ED revealed anemia with hemoglobin of 5.6, WBC 48, platelets 1009, lactic acid 2.4, BNP 86, high-sensitivity troponin 35, BUN 99, creatinine 1.92, bicarb 17, anion gap 18.  Upon exam she was noted to be positive for  melena.  Chest x-ray without evidence of pneumonia, urinalysis without evidence of UTI.  CT abdomen and pelvis negative for any acute intra-abdominal abnormality.  Her COVID-19 PCR is negative, influenza PCR is negative, and C. difficile antigen positive but C. difficile toxin negative (results indeterminate for C. Difficile).  She is admitted to stepdown unit by hospitalist for further work-up and treatment of hemorrhagic shock in the setting of acute blood loss anemia due to GI bleed and questionable septic shock of unknown etiology.  GI and ID are following.  PCCM was consulted for assistance in managing hypotension.  PAST MEDICAL HISTORY :   has a past medical history of Hypertension and Seasonal allergies.  has a past surgical history that includes Replacement total knee (Bilateral, 2008); Cesarean section; Tumor removal; Dilation and curettage of uterus; ORIF ankle fracture (Right, 02/08/2019); and Syndesmosis repair (Right, 02/08/2019). Prior to Admission medications   Medication Sig Start Date End Date Taking? Authorizing Provider  acetaminophen (TYLENOL) 500 MG tablet Take 1,000 mg by mouth every 6 (six) hours as needed for mild pain or headache.    Yes [provider]  albuterol (PROVENTIL HFA;VENTOLIN HFA) 108 (90 Base) MCG/ACT inhaler Inhale 2 puffs into the lungs every 4 (four) hours as needed. Patient taking differently: Inhale 2 puffs into the lungs every 4 (four) hours as needed for wheezing or shortness of breath.  05/09/16  Yes Wilhelmina Mcardle, MD  aspirin EC 325 MG tablet Take 1 tablet (325 mg total) by mouth daily. Patient taking differently: Take 325 mg by mouth 2 (two) times daily.  02/13/19  Yes Duanne Guess, PA-C  azithromycin (ZITHROMAX) 250  MG tablet Take 250 mg by mouth daily. 02/15/19 02/20/19 Yes [provider]  bisacodyl (DULCOLAX) 10 MG suppository Place 1 suppository (10 mg total) rectally daily as needed for moderate constipation. 02/10/19  Yes Duanne Guess, PA-C  Calcium-Vitamin D 600-200 MG-UNIT per tablet Take 2 tablets by mouth daily.    Yes [provider]  carvedilol (COREG) 3.125 MG tablet Take 3.125 mg by mouth 2 (two) times daily. 12/07/18  Yes [provider]  cetirizine (ZYRTEC) 5 MG tablet Take 10 mg by mouth daily.    Yes [provider]  cyclobenzaprine (FLEXERIL) 10 MG tablet Take 10 mg by mouth daily as needed for muscle spasms.   Yes [provider]  fluocinonide cream (LIDEX) AB-123456789 % Apply 1 application topically 3 (three) times daily as needed (mouth sores). Apply to mouth lesions as needed 01/17/19  Yes [provider]  hydroxypropyl methylcellulose / hypromellose (ISOPTO TEARS / GONIOVISC) 2.5 % ophthalmic solution Place 1 drop into both eyes every 8 (eight) hours as needed for dry eyes.    Yes [provider]  Multiple Vitamin (MULTIVITAMIN WITH MINERALS) TABS tablet Take 1 tablet by mouth daily.   Yes [provider]  Multiple Vitamins-Minerals (PRESERVISION AREDS 2 PO) Take 1 tablet by mouth 2 (two) times daily.   Yes [provider]  omeprazole (PRILOSEC) 20 MG capsule Take 20 mg by mouth 2 (two) times daily as needed (Heartburn).  04/23/15  Yes [provider]  ondansetron (ZOFRAN) 8 MG tablet Take 1 tablet (8 mg total) by mouth every 8 (eight) hours as needed for nausea or vomiting. 02/06/19  Yes Sable Feil, PA-C  traMADol (ULTRAM) 50 MG tablet Take 1-2 tablets (50-100 mg total) by mouth every 6 (six) hours as needed for severe pain. 02/10/19  Yes Duanne Guess, PA-C  triamterene-hydrochlorothiazide (MAXZIDE-25) 37.5-25 MG tablet Take 1 tablet by mouth daily. 03/21/18  Yes [provider]   Allergies  Allergen Reactions  . Celecoxib Nausea Only  . Hydrocodone-Acetaminophen Nausea And Vomiting  . Meloxicam Nausea Only  . Morphine Sulfate Er Beads Itching  . Naproxen Swelling  . Rofecoxib Nausea Only  . Sulfa Antibiotics  Other (See Comments)    Reaction: unknown  . Ciprofloxacin Rash  . Nickel Rash  . Tramadol-Acetaminophen Nausea Only and Rash    FAMILY HISTORY:  family history includes Bone cancer in her mother; COPD in her father. SOCIAL HISTORY:  reports that she quit smoking about 28 years ago. Her smoking use included cigarettes. She has a 60.00 pack-year smoking history. She has never used smokeless tobacco. She reports that she does not drink alcohol or use drugs.   COVID-19 DISASTER DECLARATION:  FULL CONTACT PHYSICAL EXAMINATION WAS NOT POSSIBLE DUE TO TREATMENT OF COVID-19 AND  CONSERVATION OF PERSONAL PROTECTIVE EQUIPMENT, LIMITED EXAM FINDINGS INCLUDE-  Patient assessed or the symptoms described in the history of present illness.  In the context of the Global COVID-19 pandemic, which necessitated consideration that the patient might be at risk for infection with the SARS-CoV-2 virus that causes COVID-19, Institutional protocols and algorithms that pertain to the evaluation of patients at risk for COVID-19 are in a state of rapid change based on information released by regulatory bodies including the CDC and federal and state organizations. These policies and algorithms were followed during the patient's care while in hospital.  REVIEW OF SYSTEMS:  Positives in BOLD: Pt currently denies all complaints Constitutional: Negative for fever, chills, weight loss,  malaise/fatigue and diaphoresis.  HENT: Negative for hearing loss, ear pain, nosebleeds, congestion, sore throat, neck pain, tinnitus and ear discharge.   Eyes: Negative for blurred vision, double vision, photophobia, pain, discharge and redness.  Respiratory: Negative for cough, hemoptysis, sputum production, shortness of breath, wheezing and stridor.   Cardiovascular: Negative for chest pain, palpitations, orthopnea, claudication, leg swelling and PND.  Gastrointestinal: Negative for heartburn, nausea, vomiting, abdominal pain, diarrhea,  constipation, blood in stool and melena.  Genitourinary: Negative for dysuria, urgency, frequency, hematuria and flank pain.  Musculoskeletal: Negative for myalgias, back pain, joint pain and falls.  Skin: Negative for itching and rash.  Neurological: Negative for dizziness, tingling, tremors, sensory change, speech change, focal weakness, seizures, loss of consciousness, weakness and headaches.  Endo/Heme/Allergies: Negative for environmental allergies and polydipsia. Does not bruise/bleed easily.  SUBJECTIVE:  Denies chest pain, SOB, cough, dizziness, abdominal pain, N/V, fever/chills Reports that it is cool in the room Having intermittent melena as reported by nursing  VITAL SIGNS: Temp:  [97.3 F (36.3 C)-97.7 F (36.5 C)] 97.3 F (36.3 C) (01/20 1754) Pulse Rate:  [87-94] 88 (01/20 1900) Resp:  [14-21] 16 (01/20 1900) BP: (95-131)/(44-95) 95/64 (01/20 1900) SpO2:  [95 %-100 %] 95 % (01/20 1800)  PHYSICAL EXAMINATION: General:  Acute on chronically ill appearing female, laying in bed, on Nasal cannula, in NAD Neuro:  Awake, A&O x3, follows commands, no focal deficits, speech clear HEENT:  Atraumatic, normocephalic, neck supple, no JVD Cardiovascular:  RRR, s1s2, no M/R/G Lungs:  Clear to auscultation bilaterally, no wheezing or rales, even, nonlabored, normal effort Abdomen:  Obese, soft, nontender, nondistended, no guarding or rebound tenderness, BS hypoactive Musculoskeletal:  No deformites Skin:  Warm and dry.  No obvious rashes, lesions, or ulcerations  Recent Labs  Lab 02/20/19 0955  NA 136  K 3.4*  CL 101  CO2 17*  BUN 99*  CREATININE 1.92*  GLUCOSE 135*   Recent Labs  Lab 02/20/19 0955  HGB 5.6*  HCT 18.1*  WBC 48.0*  PLT 1,009*   CT ABDOMEN PELVIS WO CONTRAST  Result Date: 02/20/2019 CLINICAL DATA:  Abdomen pain fever postop EXAM: CT ABDOMEN AND PELVIS WITHOUT CONTRAST TECHNIQUE: Multidetector CT imaging of the abdomen and pelvis was performed  following the standard protocol without IV contrast. COMPARISON:  Chest x-ray 02/20/2019 FINDINGS: Lower chest: Lung bases demonstrate no acute consolidation or effusion. The heart size is within normal limits. Hepatobiliary: Gallbladder is not clearly identified and is presumably surgically absent. Extrahepatic common bile duct slightly enlarged at 1 cm. No focal hepatic abnormality Pancreas: Unremarkable. No pancreatic ductal dilatation or surrounding inflammatory changes. Spleen: Normal in size without focal abnormality. Adrenals/Urinary Tract: Adrenal glands are normal. No hydronephrosis. Low-attenuation lesions in the kidneys likely cysts. No ureteral stone. Distended urinary bladder Stomach/Bowel: Radiopaque material in the stomach. No dilated small bowel. No bowel wall thickening. Sigmoid colon diverticula without acute inflammatory change. Appendix not well seen but no right lower quadrant inflammatory process. Vascular/Lymphatic: Nonaneurysmal aorta with mild aortic atherosclerosis. No significantly enlarged lymph nodes Reproductive: Small uterine calcifications, possible fibroids. No adnexal mass. Other: Negative for free air or free fluid. Musculoskeletal: Grade 1 anterolisthesis L4 on L5. Degenerative changes of the spine. No acute osseous abnormality. IMPRESSION: 1. No CT evidence for acute intra-abdominal or intrapelvic abnormality. 2. Sigmoid colon diverticular disease without acute inflammatory change. 3. Nonvisualized gallbladder presumably due to prior cholecystectomy. Prominent extrahepatic bile duct which may be secondary to postsurgical change though suggest correlation with LFTs.  Electronically Signed   By: Donavan Foil M.D.   On: 02/20/2019 17:23   DG Chest Port 1 View  Result Date: 02/20/2019 CLINICAL DATA:  Chest pressure EXAM: PORTABLE CHEST 1 VIEW COMPARISON:  February 12, 2019 FINDINGS: Lungs are clear. Heart size and pulmonary vascularity are normal. No adenopathy. No pneumothorax.  No bone lesions. IMPRESSION: Lungs clear.  Cardiac silhouette within normal limits. Electronically Signed   By: Lowella Grip III M.D.   On: 02/20/2019 12:16    ASSESSMENT / PLAN:  Hemorrhagic shock + ? Septic Shock -Continuous cardiac monitoring -Maintain MAP >65 -IV Fluids -Blood transfusions as indicated -Neosynephrine as needed to maintain MAP goal -High sensitivity Troponin 35 -Consider 2D Echocardiogram  Leukocytosis ? Sepsis, no obvious source -Monitor fever curve -Trend WBC's & Procalcitonin -Follow cultures as above -Continue Cefepime, Flagyl, Vancomycin -ID following, appreciate input -CXR without evidence of PNA -CT abdomen without evidence of acute intraabdominal infection -C. Difficile antigen +, but toxin - (results indeterminate) -Right leg cast needs to be removes to assess surgical site  Acute Blood loss anemia in setting of GI Bleed Thrombocytosis -Monitor for S/Sx of bleeding -Trend CBC (H&H q6h) -SCD's for VTE Prophylaxis (no chemical prophylaxis given bleed) -Transfuse for Hgb <8 -Protonix gtt -GI following, appreciate input ~ plan for EGD when hemodynamically stable -If exhibits overt bleeding, will need tagged RBC scan to localize source of bleeding  AKI Anion gap Acidosis in setting of Lactic Acidosis -Monitor I&O's / urinary output -Follow BMP -Ensure adequate renal perfusion -Avoid nephrotoxic agents as able -Replace electrolytes as indicated -IV Fluids -Trend lactic acid  Right ankle fracture s/p recent ORIF -Ortho consult to remove cast to evaluate surgical site         DISPOSITION: ICU GOALS OF CARE: Full Code VTE PROPHYLAXIS: SCD'S UPDATES: Updated pt at bedside 02/20/2019  Darel Hong, [facility] Pager: [phone]  02/20/2019, 8:04 PM

## 2019-02-20 NOTE — H&P (Signed)
[facility] NAME: Rasheika Madyun    MR#:  [id number]  DATE OF BIRTH:  1945/12/08  DATE OF ADMISSION:  02/20/2019  PRIMARY CARE PHYSICIAN: Derinda Late, MD   REQUESTING/REFERRING PHYSICIAN: Vanessa Ashwaubenon, MD  CHIEF COMPLAINT:   Chief Complaint  Patient presents with  . Chest Pain    HISTORY OF PRESENT ILLNESS:  Traci Peers  is a 74 y.o. female with a known history of recent ankle fracture on 1/7 who comes from [place] for chest pain.  EMS was picking patient up to go to the orthopedic appointment when she stated to them that she was having chest discomfort.  Patient states that her chest discomfort started this morning, unclear what time, chest pressure sensation, nothing made it better, nothing made it worse.  According to EMS her saturations were in the high 80s to 90s. She was on some blood thinners right after the fracture but denies being on them currently.   While In the emergency room, her hemoglobin was 5.6 and melena noted on exam.  White cell count of 48,000 and platelet count of over thousand. She is being admitted for further evaluation of possible sepsis, anemia. 2 PRBC ordered by EDP. She denies any abd pain/diarrhea. PAST MEDICAL HISTORY:   Past Medical History:  Diagnosis Date  . Hypertension   . Seasonal allergies     PAST SURGICAL HISTORY:   Past Surgical History:  Procedure Laterality Date  . CESAREAN SECTION    . DILATION AND CURETTAGE OF UTERUS    . ORIF ANKLE FRACTURE Right 02/08/2019   Procedure: OPEN REDUCTION INTERNAL FIXATION (ORIF) ANKLE FRACTURE, post malleolus;  Surgeon: Hessie Knows, MD;  Location: [facility] ORS;  Service: Orthopedics;  Laterality: Right;  . REPLACEMENT TOTAL KNEE Bilateral 2008   2009  . SYNDESMOSIS REPAIR Right 02/08/2019   Procedure: SYNDESMOSIS REPAIR;  Surgeon: Hessie Knows, MD;  Location: [facility] ORS;  Service: Orthopedics;  Laterality: Right;  . TUMOR REMOVAL      SOCIAL HISTORY:   Social History   Tobacco Use  . Smoking status: Former Smoker    Packs/day: 2.00    Years: 30.00    Pack years: 60.00    Types: Cigarettes    Quit date: 10/02/1990    Years since quitting: 28.4  . Smokeless tobacco: Never Used  Substance Use Topics  . Alcohol use: No    Alcohol/week: 0.0 standard drinks    FAMILY HISTORY:   Family History  Problem Relation Age of Onset  . Bone cancer Mother   . COPD Father     DRUG ALLERGIES:   Allergies  Allergen Reactions  . Celecoxib Nausea Only  . Hydrocodone-Acetaminophen Nausea And Vomiting  . Meloxicam Nausea Only  . Morphine Sulfate Er Beads Itching  . Naproxen Swelling  . Rofecoxib Nausea Only  . Sulfa Antibiotics Other (See Comments)    Reaction: unknown  . Ciprofloxacin Rash  . Nickel Rash  . Tramadol-Acetaminophen Nausea Only and Rash    REVIEW OF SYSTEMS:   Review of Systems  Constitutional: Positive for malaise/fatigue. Negative for diaphoresis, fever and weight loss.  HENT: Negative for ear discharge, ear pain, hearing loss, nosebleeds, sore throat and tinnitus.   Eyes: Negative for blurred vision and pain.  Respiratory: Negative for cough, hemoptysis, shortness of breath and wheezing.   Cardiovascular: Positive for chest pain. Negative for palpitations, orthopnea and leg swelling.  Gastrointestinal: Negative for abdominal pain, blood in stool, constipation, diarrhea, heartburn,  nausea and vomiting.  Genitourinary: Negative for dysuria, frequency and urgency.  Musculoskeletal: Negative for back pain and myalgias.  Skin: Negative for itching and rash.  Neurological: Negative for dizziness, tingling, tremors, focal weakness, seizures, weakness and headaches.  Psychiatric/Behavioral: Negative for depression. The patient is not nervous/anxious.     MEDICATIONS AT HOME:   Prior to Admission medications   Medication Sig Start Date End Date Taking? Authorizing Provider  acetaminophen (TYLENOL) 500 MG tablet Take  1,000 mg by mouth every 6 (six) hours as needed for mild pain or headache.    Yes [provider]  albuterol (PROVENTIL HFA;VENTOLIN HFA) 108 (90 Base) MCG/ACT inhaler Inhale 2 puffs into the lungs every 4 (four) hours as needed. Patient taking differently: Inhale 2 puffs into the lungs every 4 (four) hours as needed for wheezing or shortness of breath.  05/09/16  Yes Wilhelmina Mcardle, MD  aspirin EC 325 MG tablet Take 1 tablet (325 mg total) by mouth daily. Patient taking differently: Take 325 mg by mouth 2 (two) times daily.  02/13/19  Yes Duanne Guess, PA-C  azithromycin (ZITHROMAX) 250 MG tablet Take 250 mg by mouth daily. 02/15/19 02/20/19 Yes [provider]  bisacodyl (DULCOLAX) 10 MG suppository Place 1 suppository (10 mg total) rectally daily as needed for moderate constipation. 02/10/19  Yes Duanne Guess, PA-C  Calcium-Vitamin D 600-200 MG-UNIT per tablet Take 2 tablets by mouth daily.    Yes [provider]  carvedilol (COREG) 3.125 MG tablet Take 3.125 mg by mouth 2 (two) times daily. 12/07/18  Yes [provider]  cetirizine (ZYRTEC) 5 MG tablet Take 10 mg by mouth daily.    Yes [provider]  cyclobenzaprine (FLEXERIL) 10 MG tablet Take 10 mg by mouth daily as needed for muscle spasms.   Yes [provider]  fluocinonide cream (LIDEX) [id number] % Apply 1 application topically 3 (three) times daily as needed (mouth sores). Apply to mouth lesions as needed 01/17/19  Yes [provider]  hydroxypropyl methylcellulose / hypromellose (ISOPTO TEARS / GONIOVISC) 2.5 % ophthalmic solution Place 1 drop into both eyes every 8 (eight) hours as needed for dry eyes.    Yes [provider]  Multiple Vitamin (MULTIVITAMIN WITH MINERALS) TABS tablet Take 1 tablet by mouth daily.   Yes [provider]  Multiple Vitamins-Minerals (PRESERVISION AREDS 2 PO) Take 1 tablet by mouth 2 (two) times daily.   Yes [provider]   omeprazole (PRILOSEC) 20 MG capsule Take 20 mg by mouth 2 (two) times daily as needed (Heartburn).  04/23/15  Yes [provider]  ondansetron (ZOFRAN) 8 MG tablet Take 1 tablet (8 mg total) by mouth every 8 (eight) hours as needed for nausea or vomiting. 02/06/19  Yes Sable Feil, PA-C  traMADol (ULTRAM) 50 MG tablet Take 1-2 tablets (50-100 mg total) by mouth every 6 (six) hours as needed for severe pain. 02/10/19  Yes Duanne Guess, PA-C  triamterene-hydrochlorothiazide (MAXZIDE-25) 37.5-25 MG tablet Take 1 tablet by mouth daily. 03/21/18  Yes [provider]      VITAL SIGNS:  Blood pressure 104/88, pulse 94, temperature 97.7 F (36.5 C), temperature source Oral, resp. rate 20, SpO2 100 %.  PHYSICAL EXAMINATION:  Physical Exam  GENERAL:  74 y.o.-year-old patient lying in the bed with no acute distress.  EYES: Pupils equal, round, reactive to light and accommodation. No scleral icterus. Extraocular muscles intact.  HEENT: Head atraumatic, normocephalic. Oropharynx and nasopharynx clear.  NECK:  Supple, no jugular venous distention. No thyroid enlargement, no tenderness.  LUNGS: Normal breath sounds bilaterally, no wheezing, rales,rhonchi or crepitation. No use of accessory muscles of respiration.  CARDIOVASCULAR: S1, S2 normal. No murmurs, rubs, or gallops.  ABDOMEN: Soft, minimal generalized tenderness, nondistended. Bowel sounds present. No organomegaly or mass.  EXTREMITIES: No pedal edema, cyanosis, or clubbing. Rt ankle wrapped in ace NEUROLOGIC: Cranial nerves II through XII are intact. Muscle strength 5/5 in all extremities. Sensation intact. Gait not checked.  PSYCHIATRIC: The patient is alert and oriented x 3.  SKIN: No obvious rash, lesion, or ulcer.   LABORATORY PANEL:   CBC Recent Labs  Lab 02/20/19 0955  WBC 48.0*  HGB 5.6*  HCT 18.1*  PLT 1,009*    ------------------------------------------------------------------------------------------------------------------  Chemistries  Recent Labs  Lab 02/20/19 0955  NA 136  K 3.4*  CL 101  CO2 17*  GLUCOSE 135*  BUN 99*  CREATININE 1.92*  CALCIUM 9.4  AST 26  ALT 18  ALKPHOS 107  BILITOT 0.8   ------------------------------------------------------------------------------------------------------------------  Cardiac Enzymes No results for input(s): TROPONINI in the last 168 hours. ------------------------------------------------------------------------------------------------------------------  RADIOLOGY:  DG Chest Port 1 View  Result Date: 02/20/2019 CLINICAL DATA:  Chest pressure EXAM: PORTABLE CHEST 1 VIEW COMPARISON:  February 12, 2019 FINDINGS: Lungs are clear. Heart size and pulmonary vascularity are normal. No adenopathy. No pneumothorax. No bone lesions. IMPRESSION: Lungs clear.  Cardiac silhouette within normal limits. Electronically Signed   By: Lowella Grip III M.D.   On: 02/20/2019 12:16   IMPRESSION AND PLAN:  19 Y F being admitted for sepsis and acute anemia  * Septic shock: POA - leukocytosis, hypotensive, elevated lactate -> trend  Likely due to c.diff, sepsis protocol started - empiric treatment with cefepime + vanco + flagyl IV - ID & intensivist c/s, pressors as need - CT abd-pelvis not showing any acute patho  * Acute blood loss anemia/Melena - Hb 5.6 -> 2 PRBC ordered by EDP. If further drop, may need bleeding scan - if stabilizes, GI may consider EGD - GI c/s - Dr Vicente Males seen and appreciate his input - protonix 40 mg IV bid for now  * ARF - hydrate, monitor with agressive IVFs- avoid nephrotoxins  * Thrombocytosis - could be due to sepsis, recheck in am - if persistently elevated, consider onco c/s  * Recent Rt Ankle # Was admitted 1/7-1/14/21 for rt ankle fracture s/p ORIF on 02/08/19.     Patient is critically sick and high risk for acute  cardio-pulmo & multiorgan failure and death. Will admit to [facility] for now.  All the records are reviewed and case discussed with ED provider. Management plans discussed with the patient, Nursing and they are in agreement.  CODE STATUS: FULL CODE  TOTAL TIME (critical care) TAKING CARE OF THIS PATIENT: 45 minutes.    Max Sane M.D on 02/20/2019 at 1:14 PM  [facility]   CC: Primary care physician; Derinda Late, MD   Note: This dictation was prepared with Dragon dictation along with smaller phrase technology. Any transcriptional errors that result from this process are unintentional.

## 2019-02-20 NOTE — Progress Notes (Signed)
CODE SEPSIS - PHARMACY COMMUNICATION  **Broad Spectrum Antibiotics should be administered within 1 hour of Sepsis diagnosis**  Time Code Sepsis Called/Page Received: 1304  Antibiotics Ordered: Vancomycin,Flagyl,Cefepime  Time of 1st antibiotic administration: 1313  Additional action taken by pharmacy: none  If necessary, Name of Provider/Nurse Contacted: [place] Lorah Kalina ,PharmD Clinical Pharmacist  02/20/2019  2:05 PM

## 2019-02-20 NOTE — Sepsis Progress Note (Signed)
Notified bedside nurse of need to administer fluid bolus and collect repeat Lactic Acid after completion of fluid resusitation.

## 2019-02-20 NOTE — ED Notes (Signed)
22G IV in right forearm noted to be infiltrated. IV removed and warm compress applied to the area per Pharmacy recommendations. Full sensation and pulse to affected extremity. Redness and swelling to the area.

## 2019-02-20 NOTE — Consult Note (Addendum)
Pharmacy Antibiotic Note  Jasmine Morrow is a 74 y.o. female admitted on 02/20/2019 with sepsis of unknown source. Current WBC is 48,000, lactic acid level is 2.4 and platelet count is over 1,000. Currently has AKI. From previous labs, patient's baseline serum creatinine level is 1. Current serum creatinine is 1.92 mg/dL. Pharmacy has been consulted for Vancomycin and Cefepime dosing.  Per nurse, IV in right forearm was noted to be infiltrated. Patient completed both antibiotics prior to infiltration.   Plan: 1) Patient received Vancomycin 1g IV in the ED. Will order additional 1.5 g to complete loading dose of 2.5g total. Per nurse, right forearm IV was noted to be infiltrated, causing delay in 1.5g dose administration.  Will order Vancomycin Random level for 02/20/2018@2200     2) Cefepime 2g IV Q12 hours.      Temp (24hrs), Avg:97.7 F (36.5 C), Min:97.7 F (36.5 C), Max:97.7 F (36.5 C)  Recent Labs  Lab 02/20/19 0955 02/20/19 1159  WBC 48.0*  --   CREATININE 1.92*  --   LATICACIDVEN  --  2.4*    Estimated Creatinine Clearance: 39.2 mL/min (A) (by C-G formula based on SCr of 1.92 mg/dL (H)).    Allergies  Allergen Reactions  . Celecoxib Nausea Only  . Hydrocodone-Acetaminophen Nausea And Vomiting  . Meloxicam Nausea Only  . Morphine Sulfate Er Beads Itching  . Naproxen Swelling  . Rofecoxib Nausea Only  . Sulfa Antibiotics Other (See Comments)    Reaction: unknown  . Ciprofloxacin Rash  . Nickel Rash  . Tramadol-Acetaminophen Nausea Only and Rash    Antimicrobials this admission: Vancomycin 1/20 >>  Cefepime 1/20 >>    Microbiology results: 1/20 BCx: pending 1/20 UCx: pending   Thank you for allowing pharmacy to be a part of this patient's care.  Pearla Dubonnet 02/20/2019 3:51 PM

## 2019-02-20 NOTE — ED Provider Notes (Signed)
[facility] Emergency Department Provider Note  ____________________________________________   First MD Initiated Contact with Patient 02/20/19 [phone]     (approximate)  I have reviewed the triage vital signs and the nursing notes.   HISTORY  Chief Complaint Chest Pain    HPI Jasmine Morrow is a 74 y.o. female with recent trimall ankle fracture on 1/7 who comes from [place] for chest pain.  EMS was picking patient up to go to the orthopedic appointment when she stated to them that she was having chest discomfort.  Patient states that her chest discomfort started this morning, unclear what time, chest pressure sensation, nothing made it better, nothing made it worse.  According to EMS her saturations were in the high 80s to 90s.  She denies any prior history of lung issues.  She denies feeling short of breath but is on 2L.  She was on some blood thinners right after the fracture but denies being on them currently.  She denies any fevers or cough.  She states that she thinks she was just frustrated and that is what caused her chest pain.       Past Medical History:  Diagnosis Date  . Hypertension   . Seasonal allergies     Patient Active Problem List   Diagnosis Date Noted  . Trimalleolar fracture of ankle, closed, right, initial encounter 02/07/2019  . Multiple lung nodules 03/31/2014  . 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 OSA on CPAP 03/27/2014    Past Surgical History:  Procedure Laterality Date  . CESAREAN SECTION    . DILATION AND CURETTAGE OF UTERUS    . ORIF ANKLE FRACTURE Right 02/08/2019   Procedure: OPEN REDUCTION INTERNAL FIXATION (ORIF) ANKLE FRACTURE, post malleolus;  Surgeon: Hessie Knows, MD;  Location: [facility] ORS;  Service: Orthopedics;  Laterality: Right;  . REPLACEMENT TOTAL KNEE Bilateral 2008   2009  . SYNDESMOSIS REPAIR Right 02/08/2019   Procedure: SYNDESMOSIS REPAIR;  Surgeon: Hessie Knows, MD;  Location: [facility] ORS;  Service:  Orthopedics;  Laterality: Right;  . TUMOR REMOVAL      Prior to Admission medications   Medication Sig Start Date End Date Taking? Authorizing Provider  acetaminophen (TYLENOL) 500 MG tablet Take 1,000 mg by mouth every 6 (six) hours as needed for mild pain or headache.     [provider]  albuterol (PROVENTIL HFA;VENTOLIN HFA) 108 (90 Base) MCG/ACT inhaler Inhale 2 puffs into the lungs every 4 (four) hours as needed. Patient taking differently: Inhale 2 puffs into the lungs every 4 (four) hours as needed for wheezing or shortness of breath.  05/09/16   Wilhelmina Mcardle, MD  aspirin EC 325 MG tablet Take 1 tablet (325 mg total) by mouth daily. 02/13/19   Duanne Guess, PA-C  bisacodyl (DULCOLAX) 10 MG suppository Place 1 suppository (10 mg total) rectally daily as needed for moderate constipation. 02/10/19   Duanne Guess, PA-C  Calcium-Vitamin D 600-200 MG-UNIT per tablet Take 2 tablets by mouth daily.     [provider]  carvedilol (COREG) 3.125 MG tablet Take 3.125 mg by mouth 2 (two) times daily. 12/07/18   [provider]  Cetirizine HCl 10 MG CAPS Take 10 mg by mouth daily.     [provider]  cyclobenzaprine (FLEXERIL) 10 MG tablet Take 10 mg by mouth daily as needed for muscle spasms.    [provider]  fluocinonide gel (LIDEX) [id number] % Apply 1 application topically 3 (three) times  daily as needed. Apply to mouth lesions as needed 01/17/19   [provider]  hydroxypropyl methylcellulose / hypromellose (ISOPTO TEARS / GONIOVISC) 2.5 % ophthalmic solution Place 1 drop into both eyes 4 (four) times daily as needed for dry eyes.    [provider]  Multiple Vitamin (MULTIVITAMIN WITH MINERALS) TABS tablet Take 1 tablet by mouth daily.    [provider]  Multiple Vitamins-Minerals (PRESERVISION AREDS 2 PO) Take 1 tablet by mouth 2 (two) times daily.    [provider]  NONFORMULARY OR COMPOUNDED ITEM Apply 1  application topically See admin instructions. Compounded cream containing estradiol. Applies vaginally twice a week.    [provider]  omeprazole (PRILOSEC) 20 MG capsule Take 20 mg by mouth 2 (two) times daily as needed (Heartburn).  04/23/15   [provider]  ondansetron (ZOFRAN) 8 MG tablet Take 1 tablet (8 mg total) by mouth every 8 (eight) hours as needed for nausea or vomiting. 02/06/19   Sable Feil, PA-C  traMADol (ULTRAM) 50 MG tablet Take 1-2 tablets (50-100 mg total) by mouth every 6 (six) hours as needed for severe pain. 02/10/19   Duanne Guess, PA-C  triamterene-hydrochlorothiazide (MAXZIDE-25) 37.5-25 MG tablet Take 1 tablet by mouth daily. 03/21/18   [provider]    Allergies Celecoxib, Hydrocodone-acetaminophen, Meloxicam, Morphine sulfate er beads, Naproxen, Rofecoxib, Sulfa antibiotics, Ciprofloxacin, Nickel, and Tramadol-acetaminophen  Family History  Problem Relation Age of Onset  . Bone cancer Mother   . COPD Father     Social History Social History   Tobacco Use  . Smoking status: Former Smoker    Packs/day: 2.00    Years: 30.00    Pack years: 60.00    Types: Cigarettes    Quit date: 10/02/1990    Years since quitting: 28.4  . Smokeless tobacco: Never Used  Substance Use Topics  . Alcohol use: No    Alcohol/week: 0.0 standard drinks  . Drug use: No      Review of Systems Constitutional: No fever/chills Eyes: No visual changes. ENT: No sore throat. Cardiovascular: Positive chest pain Respiratory: Denies shortness of breath. Gastrointestinal: No abdominal pain.  No nausea, no vomiting.  No diarrhea.  No constipation.  Positive frequent stools Genitourinary: Negative for dysuria. Musculoskeletal: Negative for back pain. Skin: Negative for rash. Neurological: Negative for headaches, focal weakness or numbness. All other ROS negative ____________________________________________   PHYSICAL EXAM:  VITAL SIGNS: ED  Triage Vitals [02/20/19 0951]  Enc Vitals Group     BP (!) 104/57     Pulse Rate 94     Resp      Temp 97.7 F (36.5 C)     Temp Source Oral     SpO2      Weight      Height      Head Circumference      Peak Flow      Pain Score 7     Pain Loc      Pain Edu?      Excl. in [place]?     Constitutional: Alert and oriented. Well appearing and in no acute distress overweight female. Eyes: Conjunctivae are normal. EOMI. Head: Atraumatic. Nose: No congestion/rhinnorhea. Mouth/Throat: Mucous membranes are moist.   Neck: No stridor. Trachea Midline. FROM Cardiovascular: Normal rate, regular rhythm. Grossly normal heart sounds.  Good peripheral circulation. Respiratory: Normal respiratory effort.  No retractions. Lungs CTAB. Gastrointestinal: Soft and nontender. No distention. No abdominal bruits.  Musculoskeletal: No  lower extremity tenderness nor edema.  No joint effusions.  Right foot in a splint. Neurologic:  Normal speech and language. No gross focal neurologic deficits are appreciated.  Skin:  Skin is warm, dry and intact. No rash noted. Psychiatric: Mood and affect are normal. Speech and behavior are normal. GU: Deferred   ____________________________________________   LABS (all labs ordered are listed, but only abnormal results are displayed)  Labs Reviewed  CBC WITH DIFFERENTIAL/PLATELET - Abnormal; Notable for the following components:      Result Value   WBC 48.0 (*)    RBC 2.02 (*)    Hemoglobin 5.6 (*)    HCT 18.1 (*)    RDW 20.5 (*)    Platelets 1,009 (*)    nRBC 3.0 (*)    Neutro Abs 40.6 (*)    Monocytes Absolute 2.7 (*)    Abs Immature Granulocytes 2.19 (*)    All other components within normal limits  COMPREHENSIVE METABOLIC PANEL - Abnormal; Notable for the following components:   Potassium 3.4 (*)    CO2 17 (*)    Glucose, Bld 135 (*)    BUN 99 (*)    Creatinine, Ser 1.92 (*)    Total Protein 6.1 (*)    Albumin 2.2 (*)    GFR calc non Af Amer 26 (*)      GFR calc Af Amer 30 (*)    Anion gap 18 (*)    All other components within normal limits  LIPASE, BLOOD - Abnormal; Notable for the following components:   Lipase 66 (*)    All other components within normal limits  IRON AND TIBC - Abnormal; Notable for the following components:   Saturation Ratios 9 (*)    All other components within normal limits  TROPONIN I (HIGH SENSITIVITY) - Abnormal; Notable for the following components:   Troponin I (High Sensitivity) 35 (*)    All other components within normal limits  RESPIRATORY PANEL BY RT PCR (FLU A&B, COVID)  CULTURE, BLOOD (ROUTINE X 2)  CULTURE, BLOOD (ROUTINE X 2)  BRAIN NATRIURETIC PEPTIDE  FERRITIN  LACTATE DEHYDROGENASE  RETICULOCYTES  HAPTOGLOBIN  LACTIC ACID, PLASMA  LACTIC ACID, PLASMA  TYPE AND SCREEN  PREPARE RBC (CROSSMATCH)  ABO/RH  TROPONIN I (HIGH SENSITIVITY)   ____________________________________________   ED ECG REPORT I, Vanessa [place], the attending physician, personally viewed and interpreted this ECG.  EKG is normal sinus rate of 93, no ST elevation, no T wave inversions, QTC of 535  Repeat EKG normal sinus rhythm 95, no ST elevation, no T wave inversions, QTC of 511 ____________________________________________  RADIOLOGY Robert Bellow, personally viewed and evaluated these images (plain radiographs) as part of my medical decision making, as well as reviewing the written report by the radiologist.  ED MD interpretation: Lungs show no evidence of pneumonia  Official radiology report(s): DG Chest Port 1 View  Result Date: 02/20/2019 CLINICAL DATA:  Chest pressure EXAM: PORTABLE CHEST 1 VIEW COMPARISON:  February 12, 2019 FINDINGS: Lungs are clear. Heart size and pulmonary vascularity are normal. No adenopathy. No pneumothorax. No bone lesions. IMPRESSION: Lungs clear.  Cardiac silhouette within normal limits. Electronically Signed   By: Lowella Grip III M.D.   On: 02/20/2019 12:16     ____________________________________________   PROCEDURES  Procedure(s) performed (including Critical Care):  .Critical Care Performed by: Vanessa Rankin, MD Authorized by: Vanessa [place], MD   Critical care provider statement:    Critical care time (  minutes):  45   Critical care was necessary to treat or prevent imminent or life-threatening deterioration of the following conditions:  Circulatory failure and sepsis   Critical care was time spent personally by me on the following activities:  Discussions with consultants, evaluation of patient's response to treatment, examination of patient, ordering and performing treatments and interventions, ordering and review of laboratory studies, ordering and review of radiographic studies, pulse oximetry, re-evaluation of patient's condition, obtaining history from patient or surrogate and review of old charts     ____________________________________________   INITIAL IMPRESSION / [place] / ED COURSE   Jasmine Morrow was evaluated in Emergency Department on 02/20/2019 for the symptoms described in the history of present illness. She was evaluated in the context of the global COVID-19 pandemic, which necessitated consideration that the patient might be at risk for infection with the SARS-CoV-2 virus that causes COVID-19. Institutional protocols and algorithms that pertain to the evaluation of patients at risk for COVID-19 are in a state of rapid change based on information released by regulatory bodies including the CDC and federal and state organizations. These policies and algorithms were followed during the patient's care in the ED.    Most Likely DDx:  -MSK (atypical chest pain) but will get cardiac markers to evaluate for ACS given risk factors/age.  Given chest pain and report of will get CT PE as well to evaluate for pulmonary embolism  DDx that was also considered d/t potential to cause harm, but was found less likely based  on history and physical (as detailed above): -PNA (no fevers, cough but CXR to evaluate) -PNX (reassured with equal b/l breath sounds, CXR to evaluate) -Symptomatic anemia (will get H&H) -Aortic Dissection as no tearing pain and no radiation to the mid back, pulses equal -Pericarditis no rub on exam, EKG changes or hx to suggest dx -Tamponade (no notable SOB, tachycardic, hypotensive) -Esophageal rupture (no h/o diffuse vomitting/no crepitus)   11:58 AM unable to get CT due to her GFR.  Will get x-ray and order VQ scan if able.  Creatinine was elevated.  Hemoglobin down to 5.6 with platelets of 1000.  Patient consented for 2 units of blood.  Paged GI x2 to let them be aware that she does have melena on exam.  Blood pressures are slightly soft.  Getting 1 L of fluid and will then transfuse the blood.  Chest x-ray was negative for pneumonia or pneumothorax.  Ordered VQ scan but not started to think that this is more likely from the anemia but patient was a little hypoxic.  Patient's white count significantly elevated at 48.  No clear source but will give some broad-spectrum antibiotic biotics given some borderline blood pressures.  Discussed with the hospital team for admission     ____________________________________________   FINAL CLINICAL IMPRESSION(S) / ED DIAGNOSES   Final diagnoses:  Symptomatic anemia  Gastrointestinal hemorrhage, unspecified gastrointestinal hemorrhage type  Sepsis, due to unspecified organism, unspecified whether acute organ dysfunction present ([place])     MEDICATIONS GIVEN DURING THIS VISIT:  Medications  0.9 %  sodium chloride infusion (has no administration in time range)  pantoprazole (PROTONIX) 80 mg in sodium chloride 0.9 % 100 mL IVPB (80 mg Intravenous New Bag/Given 02/20/19 1301)  pantoprazole (PROTONIX) 80 mg in sodium chloride 0.9 % 250 mL (0.32 mg/mL) infusion (has no administration in time range)  pantoprazole (PROTONIX) injection 40 mg (has no  administration in time range)  ceFEPIme (MAXIPIME) 2 g in  sodium chloride 0.9 % 100 mL IVPB (has no administration in time range)  metroNIDAZOLE (FLAGYL) IVPB 500 mg (has no administration in time range)  vancomycin (VANCOCIN) IVPB 1000 mg/200 mL premix (has no administration in time range)  aspirin chewable tablet 324 mg (324 mg Oral Given 02/20/19 1013)  fentaNYL (SUBLIMAZE) injection 50 mcg (50 mcg Intravenous Given 02/20/19 1016)  ondansetron (ZOFRAN) injection 4 mg (4 mg Intravenous Given 02/20/19 1016)  sodium chloride 0.9 % bolus 1,000 mL (1,000 mLs Intravenous New Bag/Given 02/20/19 1253)     ED Discharge Orders    None       Note:  This document was prepared using Dragon voice recognition software and may include unintentional dictation errors.   Vanessa [place], MD 02/20/19 [phone]

## 2019-02-20 NOTE — Consult Note (Signed)
NAME: Jasmine Morrow  DOB: 09-Jan-1946  MRN: [id number]  Date/Time: 02/20/2019 4:58 PM  REQUESTING PROVIDER: Dr.Shah Subjective:  REASON FOR CONSULT: sepsis  ?pt is a limited historian Jasmine Morrow is a 74 y.o. with a history of HTn Sent to the ED from SNF as she has been having  Chest pressure since her visit to her ortho this morning. In the ED temp 97.7, BP 104/57, HR 97, pulse ox 100% her wbc was 48, Hb 5.6, PLT 1009, cr 1,92 BLD culture sent, CXR normal  Pt was recently in [facility] between 1/7-1/14/21 for rt ankle fracture for which she underwent ORIF on 02/08/19. Given cefazolin for surgical prophylaxis She was initially given lovenox On day  She had some confusion which was thought to be from not wearing CPAP. CXR had shown some atelectasis ( could not rule out pneumonia) and was started on zithromax. She was better with CPAP and was discharged to [facility] on 1/14 on 4 l oxygen On 02/07/19 cr was 2.51  And on /13/21 with was 1.14. Hb was 11.8 on 1/7, WBC 10.6 and PLT 169  Pt denies fever, abdominal pain Has had diarrhea for a week Black stool She says NSAIDS causes her to have gastritis On 81 mg aspirin Denies taking advil, or other NSAID  Past Medical History:  Diagnosis Date  . Hypertension   . Seasonal allergies     Past Surgical History:  Procedure Laterality Date  . CESAREAN SECTION    . DILATION AND CURETTAGE OF UTERUS    . ORIF ANKLE FRACTURE Right 02/08/2019   Procedure: OPEN REDUCTION INTERNAL FIXATION (ORIF) ANKLE FRACTURE, post malleolus;  Surgeon: Hessie Knows, MD;  Location: [facility] ORS;  Service: Orthopedics;  Laterality: Right;  . REPLACEMENT TOTAL KNEE Bilateral 2008   2009  . SYNDESMOSIS REPAIR Right 02/08/2019   Procedure: SYNDESMOSIS REPAIR;  Surgeon: Hessie Knows, MD;  Location: [facility] ORS;  Service: Orthopedics;  Laterality: Right;  . TUMOR REMOVAL      Social History   Socioeconomic History  . Marital status: Married    Spouse name: Not on file  . Number  of children: Not on file  . Years of education: Not on file  . Highest education level: Not on file  Occupational History  . Not on file  Tobacco Use  . Smoking status: Former Smoker    Packs/day: 2.00    Years: 30.00    Pack years: 60.00    Types: Cigarettes    Quit date: 10/02/1990    Years since quitting: 28.4  . Smokeless tobacco: Never Used  Substance and Sexual Activity  . Alcohol use: No    Alcohol/week: 0.0 standard drinks  . Drug use: No  . Sexual activity: Not on file  Other Topics Concern  . Not on file  Social History Narrative  . Not on file   Social Determinants of Health   Financial Resource Strain:   . Difficulty of Paying Living Expenses: Not on file  Food Insecurity:   . Worried About Charity fundraiser in the Last Year: Not on file  . Ran Out of Food in the Last Year: Not on file  Transportation Needs:   . Lack of Transportation (Medical): Not on file  . Lack of Transportation (Non-Medical): Not on file  Physical Activity:   . Days of Exercise per Week: Not on file  . Minutes of Exercise per Session: Not on file  Stress:   . Feeling of Stress :  Not on file  Social Connections:   . Frequency of Communication with Friends and Family: Not on file  . Frequency of Social Gatherings with Friends and Family: Not on file  . Attends Religious Services: Not on file  . Active Member of Clubs or Organizations: Not on file  . Attends Archivist Meetings: Not on file  . Marital Status: Not on file  Intimate Partner Violence:   . Fear of Current or Ex-Partner: Not on file  . Emotionally Abused: Not on file  . Physically Abused: Not on file  . Sexually Abused: Not on file    Family History  Problem Relation Age of Onset  . Bone cancer Mother   . COPD Father    Allergies  Allergen Reactions  . Celecoxib Nausea Only  . Hydrocodone-Acetaminophen Nausea And Vomiting  . Meloxicam Nausea Only  . Morphine Sulfate Er Beads Itching  . Naproxen  Swelling  . Rofecoxib Nausea Only  . Sulfa Antibiotics Other (See Comments)    Reaction: unknown  . Ciprofloxacin Rash  . Nickel Rash  . Tramadol-Acetaminophen Nausea Only and Rash    ? Current Facility-Administered Medications  Medication Dose Route Frequency Provider Last Rate Last Admin  . 0.9 %  sodium chloride infusion  10 mL/hr Intravenous Once Vanessa Greenevers, MD      . bisacodyl (DULCOLAX) suppository 10 mg  10 mg Rectal Daily PRN Max Sane, MD      . Derrill Memo ON 02/21/2019] calcium-vitamin D (OSCAL WITH D) 500-200 MG-UNIT per tablet 2 tablet  2 tablet Oral Daily Manuella Ghazi, Vipul, MD      . hydroxypropyl methylcellulose / hypromellose (ISOPTO TEARS / GONIOVISC) 2.5 % ophthalmic solution 1 drop  1 drop Both Eyes Q8H PRN Manuella Ghazi, Vipul, MD      . lactated ringers bolus 500 mL  500 mL Intravenous Once Max Sane, MD      . Derrill Memo ON 02/21/2019] loratadine (CLARITIN) tablet 10 mg  10 mg Oral Daily Manuella Ghazi, Vipul, MD      . metroNIDAZOLE (FLAGYL) IVPB 500 mg  500 mg Intravenous Q8H Max Sane, MD   Stopped at 02/20/19 1438  . ondansetron (ZOFRAN) tablet 8 mg  8 mg Oral Q8H PRN Max Sane, MD      . pantoprazole (PROTONIX) 80 mg in sodium chloride 0.9 % 250 mL (0.32 mg/mL) infusion  8 mg/hr Intravenous Continuous Vanessa Scotland, MD 25 mL/hr at 02/20/19 1304 8 mg/hr at 02/20/19 1304  . [START ON 02/24/2019] pantoprazole (PROTONIX) injection 40 mg  40 mg Intravenous Q12H Vanessa [place], MD      . vancomycin (VANCOREADY) IVPB 1500 mg/300 mL  1,500 mg Intravenous Once Pearla Dubonnet, [facility]       Current Outpatient Medications  Medication Sig Dispense Refill  . acetaminophen (TYLENOL) 500 MG tablet Take 1,000 mg by mouth every 6 (six) hours as needed for mild pain or headache.     . albuterol (PROVENTIL HFA;VENTOLIN HFA) 108 (90 Base) MCG/ACT inhaler Inhale 2 puffs into the lungs every 4 (four) hours as needed. (Patient taking differently: Inhale 2 puffs into the lungs every 4 (four) hours as needed for  wheezing or shortness of breath. ) 1 Inhaler 3  . aspirin EC 325 MG tablet Take 1 tablet (325 mg total) by mouth daily. (Patient taking differently: Take 325 mg by mouth 2 (two) times daily. ) 30 tablet 0  . azithromycin (ZITHROMAX) 250 MG tablet Take 250 mg by mouth daily.    Marland Kitchen  bisacodyl (DULCOLAX) 10 MG suppository Place 1 suppository (10 mg total) rectally daily as needed for moderate constipation. 12 suppository 0  . Calcium-Vitamin D 600-200 MG-UNIT per tablet Take 2 tablets by mouth daily.     . carvedilol (COREG) 3.125 MG tablet Take 3.125 mg by mouth 2 (two) times daily.    . cetirizine (ZYRTEC) 5 MG tablet Take 10 mg by mouth daily.     . cyclobenzaprine (FLEXERIL) 10 MG tablet Take 10 mg by mouth daily as needed for muscle spasms.    . fluocinonide cream (LIDEX) [id number] % Apply 1 application topically 3 (three) times daily as needed (mouth sores). Apply to mouth lesions as needed    . hydroxypropyl methylcellulose / hypromellose (ISOPTO TEARS / GONIOVISC) 2.5 % ophthalmic solution Place 1 drop into both eyes every 8 (eight) hours as needed for dry eyes.     . Multiple Vitamin (MULTIVITAMIN WITH MINERALS) TABS tablet Take 1 tablet by mouth daily.    . Multiple Vitamins-Minerals (PRESERVISION AREDS 2 PO) Take 1 tablet by mouth 2 (two) times daily.    Marland Kitchen omeprazole (PRILOSEC) 20 MG capsule Take 20 mg by mouth 2 (two) times daily as needed (Heartburn).     . ondansetron (ZOFRAN) 8 MG tablet Take 1 tablet (8 mg total) by mouth every 8 (eight) hours as needed for nausea or vomiting. 20 tablet 0  . traMADol (ULTRAM) 50 MG tablet Take 1-2 tablets (50-100 mg total) by mouth every 6 (six) hours as needed for severe pain. 40 tablet 0  . triamterene-hydrochlorothiazide (MAXZIDE-25) 37.5-25 MG tablet Take 1 tablet by mouth daily.       Abtx:  Anti-infectives (From admission, onward)   Start     Dose/Rate Route Frequency Ordered Stop   02/20/19 1330  metroNIDAZOLE (FLAGYL) IVPB 500 mg     500 mg 100  mL/hr over 60 Minutes Intravenous Every 8 hours 02/20/19 1311     02/20/19 1315  ceFEPIme (MAXIPIME) 2 g in sodium chloride 0.9 % 100 mL IVPB     2 g 200 mL/hr over 30 Minutes Intravenous  Once 02/20/19 1301 02/20/19 1404   02/20/19 1315  metroNIDAZOLE (FLAGYL) IVPB 500 mg  Status:  Discontinued     500 mg 100 mL/hr over 60 Minutes Intravenous  Once 02/20/19 1301 02/20/19 1314   02/20/19 1315  vancomycin (VANCOCIN) IVPB 1000 mg/200 mL premix  Status:  Discontinued     1,000 mg 200 mL/hr over 60 Minutes Intravenous  Once 02/20/19 1301 02/20/19 1314   02/20/19 1315  azithromycin (ZITHROMAX) tablet 250 mg  Status:  Discontinued     250 mg Oral Daily 02/20/19 1311 02/20/19 1322   02/20/19 1315  ceFEPIme (MAXIPIME) 2 g in sodium chloride 0.9 % 100 mL IVPB  Status:  Discontinued     2 g 200 mL/hr over 30 Minutes Intravenous  Once 02/20/19 1311 02/20/19 1314   02/20/19 1315  vancomycin (VANCOCIN) IVPB 1000 mg/200 mL premix     1,000 mg 200 mL/hr over 60 Minutes Intravenous  Once 02/20/19 1311 02/20/19 1430   02/20/19 1315  vancomycin (VANCOREADY) IVPB 1500 mg/300 mL     1,500 mg 150 mL/hr over 120 Minutes Intravenous  Once 02/20/19 1314        REVIEW OF SYSTEMS:  Const: negative fever, negative chills, negative weight loss Eyes: negative diplopia or visual changes, negative eye pain ENT: negative coryza, negative sore throat Resp: negative cough, hemoptysis, dyspnea Cards: negative for chest pain, palpitations, lower  extremity edema GU: negative for frequency, dysuria and hematuria GI: , diarrhea, black stool Skin: negative for rash and pruritus Heme: negative for easy bruising and gum/nose bleeding MS: general weakness, tired Neurolo:negative for headaches, dizziness, vertigo, memory problems  Psych: negative for feelings of anxiety, depression  Endocrine: negative for thyroid, diabetes Allergy/Immunology- as above? Pertinent Positives include : Objective:  VITALS:  BP 115/67    Pulse 94   Temp 97.7 F (36.5 C) (Oral)   Resp 14   SpO2 100%  PHYSICAL EXAM:  General:lethargic Oriented in person, place responds to questions but speech is not comprehensible due to dry mouth  Very pale. BMI> 50 Head: Normocephalic, without obvious abnormality, atraumatic. Eyes: Conjunctivae clear, anicteric sclerae. Pupils are equal ENT Nares normal. No drainage or sinus tenderness. Lips, mucosa, and tongue very dry Neck: Supple, symmetrical, no adenopathy, thyroid: non tender no carotid bruit and no JVD. Back:did not examine Lungs: b/l air enry Heart: Tachycardia Abdomen: Soft, non-tender,not distended. Bowel sounds normal. No masses Extremities:rt leg in cast, ecchymosis shin, some swelling rt legSkin: No rashes or lesions. Or bruising Lymph: Cervical, supraclavicular normal. Neurologic: cannot be assessed l Pertinent Labs Lab Results CBC    Component Value Date/Time   WBC 48.0 (H) 02/20/2019 0955   RBC 2.02 (L) 02/20/2019 0955   HGB 5.6 (L) 02/20/2019 0955   HCT 18.1 (L) 02/20/2019 0955   PLT 1,009 (HH) 02/20/2019 0955   MCV 89.6 02/20/2019 0955   MCH 27.7 02/20/2019 0955   MCHC 30.9 02/20/2019 0955   RDW 20.5 (H) 02/20/2019 0955   LYMPHSABS 2.4 02/20/2019 0955   MONOABS 2.7 (H) 02/20/2019 0955   EOSABS 0.0 02/20/2019 0955   BASOSABS 0.1 02/20/2019 0955    CMP Latest Ref Rng & Units 02/20/2019 02/13/2019 02/08/2019  Glucose 70 - 99 mg/dL 135(H) - -  BUN 8 - 23 mg/dL 99(H) - -  Creatinine 0.44 - 1.00 mg/dL 1.92(H) 1.14(H) 1.50(H)  Sodium 135 - 145 mmol/L 136 - -  Potassium 3.5 - 5.1 mmol/L 3.4(L) - -  Chloride 98 - 111 mmol/L 101 - -  CO2 22 - 32 mmol/L 17(L) - -  Calcium 8.9 - 10.3 mg/dL 9.4 - -  Total Protein 6.5 - 8.1 g/dL 6.1(L) - -  Total Bilirubin 0.3 - 1.2 mg/dL 0.8 - -  Alkaline Phos 38 - 126 U/L 107 - -  AST 15 - 41 U/L 26 - -  ALT 0 - 44 U/L 18 - -      Microbiology: Recent Results (from the past 240 hour(s))  Respiratory Panel by RT PCR (Flu  A&B, Covid) - Nasopharyngeal Swab     Status: None   Collection Time: 02/13/19  8:52 AM   Specimen: Nasopharyngeal Swab  Result Value Ref Range Status   SARS Coronavirus 2 by RT PCR NEGATIVE NEGATIVE Final    Comment: (NOTE) SARS-CoV-2 target nucleic acids are NOT DETECTED. The SARS-CoV-2 RNA is generally detectable in upper respiratoy specimens during the acute phase of infection. The lowest concentration of SARS-CoV-2 viral copies this assay can detect is 131 copies/mL. A negative result does not preclude SARS-Cov-2 infection and should not be used as the sole basis for treatment or other patient management decisions. A negative result may occur with  improper specimen collection/handling, submission of specimen other than nasopharyngeal swab, presence of viral mutation(s) within the areas targeted by this assay, and inadequate number of viral copies (<131 copies/mL). A negative result must be combined with clinical observations, patient  history, and epidemiological information. The expected result is Negative. Fact Sheet for Patients:  PinkCheek.be Fact Sheet for Healthcare Providers:  GravelBags.it This test is not yet ap proved or cleared by the [place] FDA and  has been authorized for detection and/or diagnosis of SARS-CoV-2 by FDA under an Emergency Use Authorization (EUA). This EUA will remain  in effect (meaning this test can be used) for the duration of the COVID-19 declaration under Section 564(b)(1) of the Act, 21 U.S.C. section 360bbb-3(b)(1), unless the authorization is terminated or revoked sooner.    Influenza A by PCR NEGATIVE NEGATIVE Final   Influenza B by PCR NEGATIVE NEGATIVE Final    Comment: (NOTE) The Xpert Xpress SARS-CoV-2/FLU/RSV assay is intended as an aid in  the diagnosis of influenza from Nasopharyngeal swab specimens and  should not be used as a sole basis for treatment. Nasal washings  and  aspirates are unacceptable for Xpert Xpress SARS-CoV-2/FLU/RSV  testing. Fact Sheet for Patients: PinkCheek.be Fact Sheet for Healthcare Providers: GravelBags.it This test is not yet approved or cleared by the [place] FDA and  has been authorized for detection and/or diagnosis of SARS-CoV-2 by  FDA under an Emergency Use Authorization (EUA). This EUA will remain  in effect (meaning this test can be used) for the duration of the  Covid-19 declaration under Section 564(b)(1) of the Act, 21  U.S.C. section 360bbb-3(b)(1), unless the authorization is  terminated or revoked. Performed at [facility], [address] [place], [place] [address]   Respiratory Panel by RT PCR (Flu A&B, Covid) - Nasopharyngeal Swab     Status: None   Collection Time: 02/20/19  9:55 AM   Specimen: Nasopharyngeal Swab  Result Value Ref Range Status   SARS Coronavirus 2 by RT PCR NEGATIVE NEGATIVE Final    Comment: (NOTE) SARS-CoV-2 target nucleic acids are NOT DETECTED. The SARS-CoV-2 RNA is generally detectable in upper respiratoy specimens during the acute phase of infection. The lowest concentration of SARS-CoV-2 viral copies this assay can detect is 131 copies/mL. A negative result does not preclude SARS-Cov-2 infection and should not be used as the sole basis for treatment or other patient management decisions. A negative result may occur with  improper specimen collection/handling, submission of specimen other than nasopharyngeal swab, presence of viral mutation(s) within the areas targeted by this assay, and inadequate number of viral copies (<131 copies/mL). A negative result must be combined with clinical observations, patient history, and epidemiological information. The expected result is Negative. Fact Sheet for Patients:  PinkCheek.be Fact Sheet for Healthcare Providers:   GravelBags.it This test is not yet ap proved or cleared by the [place] FDA and  has been authorized for detection and/or diagnosis of SARS-CoV-2 by FDA under an Emergency Use Authorization (EUA). This EUA will remain  in effect (meaning this test can be used) for the duration of the COVID-19 declaration under Section 564(b)(1) of the Act, 21 U.S.C. section 360bbb-3(b)(1), unless the authorization is terminated or revoked sooner.    Influenza A by PCR NEGATIVE NEGATIVE Final   Influenza B by PCR NEGATIVE NEGATIVE Final    Comment: (NOTE) The Xpert Xpress SARS-CoV-2/FLU/RSV assay is intended as an aid in  the diagnosis of influenza from Nasopharyngeal swab specimens and  should not be used as a sole basis for treatment. Nasal washings and  aspirates are unacceptable for Xpert Xpress SARS-CoV-2/FLU/RSV  testing. Fact Sheet for Patients: PinkCheek.be Fact Sheet for Healthcare Providers: GravelBags.it This test is not yet approved  or cleared by the [place] and  has been authorized for detection and/or diagnosis of SARS-CoV-2 by  FDA under an Emergency Use Authorization (EUA). This EUA will remain  in effect (meaning this test can be used) for the duration of the  Covid-19 declaration under Section 564(b)(1) of the Act, 21  U.S.C. section 360bbb-3(b)(1), unless the authorization is  terminated or revoked. Performed at [facility], [address] [place],  [address]     IMAGING RESULTS: CXr no infiltrate I have personally reviewed the films ? Impression/Recommendation ? ?Shock- secondary to acute blood loss -GI  Cannot r/o sepsis because of lecocytosis There eis no obvious source ( no pneumonia, No UTI)  But the rt leg cast will have to be removed to check for the surgical site  Acute anemia Leucocytosis and thrombocytosis could be reactive Until cultures are  back and th rt leg is reviewed will continue broad spectrum antibiotic- cefepime can be changed to ceftriaxone  Diarrhea= black tarry stool- melena- cdiff ruled out CT abdomen does not show any acute pathology- no colitis  AKI- could be from hypovolemia/blood loss ? __Rt ankle fracture- recent ORIF. Ortho consult to remove the cast and evaluate the surgical site _________________________________________________ Discussed with care team

## 2019-02-20 NOTE — ED Triage Notes (Signed)
Pt sent from [facility] via ems with reports of chest pressure that began today while at her ortho appt.

## 2019-02-20 NOTE — Sepsis Progress Note (Signed)
Notified provider of need to order repeat lactic acid. ° °

## 2019-02-20 NOTE — Progress Notes (Signed)
PHARMACY -  BRIEF ANTIBIOTIC NOTE   Pharmacy has received consult(s) for Vancomycin and Cefepime from an ED provider.  The patient's profile has been reviewed for ht/wt/allergies/indication/available labs.    One time order(s) placed for Vancomycin 1g IV and Cefepime 2g IV x 1 dose each.  Further antibiotics/pharmacy consults should be ordered by admitting physician if indicated.                       Thank you, Pearla Dubonnet 02/20/2019  1:09 PM

## 2019-02-21 DIAGNOSIS — W19XXXD Unspecified fall, subsequent encounter: Secondary | ICD-10-CM

## 2019-02-21 DIAGNOSIS — R0602 Shortness of breath: Secondary | ICD-10-CM

## 2019-02-21 DIAGNOSIS — A4101 Sepsis due to Methicillin susceptible Staphylococcus aureus: Principal | ICD-10-CM

## 2019-02-21 DIAGNOSIS — Z9889 Other specified postprocedural states: Secondary | ICD-10-CM

## 2019-02-21 DIAGNOSIS — A419 Sepsis, unspecified organism: Secondary | ICD-10-CM

## 2019-02-21 DIAGNOSIS — M545 Low back pain: Secondary | ICD-10-CM

## 2019-02-21 DIAGNOSIS — K922 Gastrointestinal hemorrhage, unspecified: Secondary | ICD-10-CM

## 2019-02-21 DIAGNOSIS — R6521 Severe sepsis with septic shock: Secondary | ICD-10-CM

## 2019-02-21 DIAGNOSIS — D72823 Leukemoid reaction: Secondary | ICD-10-CM

## 2019-02-21 LAB — BLOOD CULTURE ID PANEL (REFLEXED)

## 2019-02-21 LAB — CBC WITH DIFFERENTIAL/PLATELET
Abs Immature Granulocytes: 1.12 10*3/uL — ABNORMAL HIGH (ref 0.00–0.07)
Basophils Absolute: 0.1 10*3/uL (ref 0.0–0.1)
Basophils Relative: 0 %
Eosinophils Absolute: 0 10*3/uL (ref 0.0–0.5)
Eosinophils Relative: 0 %
HCT: 25.6 % — ABNORMAL LOW (ref 36.0–46.0)
Hemoglobin: 8.5 g/dL — ABNORMAL LOW (ref 12.0–15.0)
Immature Granulocytes: 3 %
Lymphocytes Relative: 5 %
Lymphs Abs: 1.6 10*3/uL (ref 0.7–4.0)
MCH: 28.8 pg (ref 26.0–34.0)
MCHC: 33.2 g/dL (ref 30.0–36.0)
MCV: 86.8 fL (ref 80.0–100.0)
Monocytes Absolute: 1.8 10*3/uL — ABNORMAL HIGH (ref 0.1–1.0)
Monocytes Relative: 5 %
Neutro Abs: 30.5 10*3/uL — ABNORMAL HIGH (ref 1.7–7.7)
Neutrophils Relative %: 87 %
Platelets: 784 10*3/uL — ABNORMAL HIGH (ref 150–400)
RBC: 2.95 MIL/uL — ABNORMAL LOW (ref 3.87–5.11)
RDW: 18.1 % — ABNORMAL HIGH (ref 11.5–15.5)
Smear Review: NORMAL
WBC: 35.2 10*3/uL — ABNORMAL HIGH (ref 4.0–10.5)
nRBC: 4.9 % — ABNORMAL HIGH (ref 0.0–0.2)

## 2019-02-21 LAB — COMPREHENSIVE METABOLIC PANEL
ALT: 18 U/L (ref 0–44)
AST: 22 U/L (ref 15–41)
Albumin: 2 g/dL — ABNORMAL LOW (ref 3.5–5.0)
Alkaline Phosphatase: 89 U/L (ref 38–126)
Anion gap: 11 (ref 5–15)
BUN: 96 mg/dL — ABNORMAL HIGH (ref 8–23)
CO2: 22 mmol/L (ref 22–32)
Calcium: 8.8 mg/dL — ABNORMAL LOW (ref 8.9–10.3)
Chloride: 104 mmol/L (ref 98–111)
Creatinine, Ser: 1.71 mg/dL — ABNORMAL HIGH (ref 0.44–1.00)
GFR calc Af Amer: 34 mL/min — ABNORMAL LOW (ref >60)
GFR calc non Af Amer: 29 mL/min — ABNORMAL LOW (ref >60)
Glucose, Bld: 139 mg/dL — ABNORMAL HIGH (ref 70–99)
Potassium: 3.4 mmol/L — ABNORMAL LOW (ref 3.5–5.1)
Sodium: 137 mmol/L (ref 135–145)
Total Bilirubin: 0.7 mg/dL (ref 0.3–1.2)
Total Protein: 5.1 g/dL — ABNORMAL LOW (ref 6.5–8.1)

## 2019-02-21 LAB — BASIC METABOLIC PANEL
Anion gap: 14 (ref 5–15)
BUN: 98 mg/dL — ABNORMAL HIGH (ref 8–23)
CO2: 18 mmol/L — ABNORMAL LOW (ref 22–32)
Calcium: 8.9 mg/dL (ref 8.9–10.3)
Chloride: 109 mmol/L (ref 98–111)
Creatinine, Ser: 1.88 mg/dL — ABNORMAL HIGH (ref 0.44–1.00)
GFR calc Af Amer: 30 mL/min — ABNORMAL LOW (ref >60)
GFR calc non Af Amer: 26 mL/min — ABNORMAL LOW (ref >60)
Glucose, Bld: 137 mg/dL — ABNORMAL HIGH (ref 70–99)
Potassium: 3.2 mmol/L — ABNORMAL LOW (ref 3.5–5.1)
Sodium: 141 mmol/L (ref 135–145)

## 2019-02-21 LAB — CBC
HCT: 21.7 % — ABNORMAL LOW (ref 36.0–46.0)
Hemoglobin: 7.2 g/dL — ABNORMAL LOW (ref 12.0–15.0)
MCH: 29.8 pg (ref 26.0–34.0)
MCHC: 33.2 g/dL (ref 30.0–36.0)
MCV: 89.7 fL (ref 80.0–100.0)
Platelets: 770 10*3/uL — ABNORMAL HIGH (ref 150–400)
RBC: 2.42 MIL/uL — ABNORMAL LOW (ref 3.87–5.11)
RDW: 18.8 % — ABNORMAL HIGH (ref 11.5–15.5)
WBC: 34.9 10*3/uL — ABNORMAL HIGH (ref 4.0–10.5)
nRBC: 2.7 % — ABNORMAL HIGH (ref 0.0–0.2)

## 2019-02-21 LAB — PROTIME-INR
INR: 1.4 — ABNORMAL HIGH (ref 0.8–1.2)
Prothrombin Time: 17 seconds — ABNORMAL HIGH (ref 11.4–15.2)

## 2019-02-21 LAB — URINALYSIS, ROUTINE W REFLEX MICROSCOPIC
Bacteria, UA: NONE SEEN
Bilirubin Urine: NEGATIVE
Glucose, UA: NEGATIVE mg/dL
Hgb urine dipstick: NEGATIVE
Ketones, ur: NEGATIVE mg/dL
Leukocytes,Ua: NEGATIVE
Nitrite: NEGATIVE
Protein, ur: NEGATIVE mg/dL
Specific Gravity, Urine: 1.016 (ref 1.005–1.030)
Squamous Epithelial / HPF: NONE SEEN (ref 0–5)
pH: 5 (ref 5.0–8.0)

## 2019-02-21 LAB — PATHOLOGIST SMEAR REVIEW

## 2019-02-21 LAB — HEMOGLOBIN AND HEMATOCRIT, BLOOD
HCT: 25.3 % — ABNORMAL LOW (ref 36.0–46.0)
HCT: 21 % — ABNORMAL LOW (ref 36.0–46.0)
Hemoglobin: 8.3 g/dL — ABNORMAL LOW (ref 12.0–15.0)
Hemoglobin: 6.8 g/dL — ABNORMAL LOW (ref 12.0–15.0)

## 2019-02-21 LAB — PREPARE RBC (CROSSMATCH)

## 2019-02-21 LAB — CORTISOL: Cortisol, Plasma: 39.9 ug/dL

## 2019-02-21 LAB — VANCOMYCIN, RANDOM: Vancomycin Rm: 29

## 2019-02-21 LAB — PROCALCITONIN
Procalcitonin: 2.3 ng/mL
Procalcitonin: 2.26 ng/mL

## 2019-02-21 LAB — RETICULOCYTES
Immature Retic Fract: 41.2 % — ABNORMAL HIGH (ref 2.3–15.9)
RBC.: 2.47 MIL/uL — ABNORMAL LOW (ref 3.87–5.11)
Retic Count, Absolute: 199.1 10*3/uL — ABNORMAL HIGH (ref 19.0–186.0)
Retic Ct Pct: 8.1 % — ABNORMAL HIGH (ref 0.4–3.1)

## 2019-02-21 LAB — APTT: aPTT: 33 seconds (ref 24–36)

## 2019-02-21 LAB — TROPONIN I (HIGH SENSITIVITY): Troponin I (High Sensitivity): 24 ng/L — ABNORMAL HIGH (ref <18)

## 2019-02-21 LAB — LACTIC ACID, PLASMA: Lactic Acid, Venous: 1.1 mmol/L (ref 0.5–1.9)

## 2019-02-21 MED ORDER — VANCOMYCIN VARIABLE DOSE PER UNSTABLE RENAL FUNCTION (PHARMACIST DOSING): Status: DC

## 2019-02-21 MED ORDER — ACETAMINOPHEN 325 MG PO TABS
650.0000 mg | ORAL_TABLET | Freq: Four times a day (QID) | ORAL | Status: DC | PRN
  Administered 2019-02-21 – 2019-03-06 (×6): 650 mg via ORAL
  Filled 2019-02-21 (×6): qty 2

## 2019-02-21 MED ORDER — SODIUM CHLORIDE 0.9% IV SOLUTION: Freq: Once | INTRAVENOUS | Status: AC

## 2019-02-21 MED ORDER — SODIUM CHLORIDE 0.9 % IV SOLN
2.0000 g | INTRAVENOUS | Status: DC
  Filled 2019-02-21: qty 20

## 2019-02-21 MED ORDER — FENTANYL CITRATE (PF) 100 MCG/2ML IJ SOLN
12.5000 ug | Freq: Once | INTRAMUSCULAR | Status: AC
  Administered 2019-02-21: 12.5 ug via INTRAVENOUS
  Filled 2019-02-21: qty 2

## 2019-02-21 MED ORDER — CEFAZOLIN SODIUM-DEXTROSE 2-4 GM/100ML-% IV SOLN
2.0000 g | Freq: Three times a day (TID) | INTRAVENOUS | Status: DC
  Administered 2019-02-21 – 2019-03-04 (×33): 2 g via INTRAVENOUS
  Filled 2019-02-21 (×38): qty 100

## 2019-02-21 MED ORDER — POTASSIUM CHLORIDE CRYS ER 20 MEQ PO TBCR
40.0000 meq | EXTENDED_RELEASE_TABLET | Freq: Once | ORAL | Status: AC
  Administered 2019-02-21: 40 meq via ORAL
  Filled 2019-02-21: qty 2

## 2019-02-21 MED ORDER — TRAZODONE HCL 50 MG PO TABS
50.0000 mg | ORAL_TABLET | Freq: Every evening | ORAL | Status: DC | PRN
  Administered 2019-02-21: 50 mg via ORAL
  Filled 2019-02-21: qty 1

## 2019-02-21 NOTE — Progress Notes (Signed)
Called and updated pt's spouse Adeana Jaglowski of pt's Shock secondary to GI bleed and sepsis of unknown source requiring blood transfusions and Neosynephrine.  Updated him that GI and ID are following, with plans for EGD when deemed appropriate by GI.  He is very appreciative of the update.   Darel Hong, AGACNP-BC [facility] Pager: [phone]

## 2019-02-21 NOTE — Progress Notes (Signed)
Jonathon Bellows , MD [address], [place], [place], [place], [address] [address] [place], [place], [place], [address] Phone: [phone]  Fax: [phone]   Jasmine Morrow is being followed for GI bleed  Day 1 of follow up   Subjective: She just had a black tarry bowel movement, been on pressors , overall she feels better and is more alert today    Objective: Vital signs in last 24 hours: Vitals:   02/21/19 0600 02/21/19 0700 02/21/19 0800 02/21/19 0830  BP: 120/77 (!) 99/56 (!) 70/54 92/73  Pulse: (!) 103 90 84   Resp: 14 15 (!) 22 16  Temp:    (!) 97.4 F (36.3 C)  TempSrc:    Oral  SpO2: 98% 100% 100% 100%   Weight change:   Intake/Output Summary (Last 24 hours) at 02/21/2019 1328 Last data filed at 02/21/2019 1200 Gross per 24 hour  Intake 1356.21 ml  Output 1500 ml  Net -143.79 ml     Exam: Alert and orientated x 3 , denies any pain  Being changed after having a bowel mvoement   Lab Results: @LABTEST2 @ Micro Results: Recent Results (from the past 240 hour(s))  Respiratory Panel by RT PCR (Flu A&B, Covid) - Nasopharyngeal Swab     Status: None   Collection Time: 02/13/19  8:52 AM   Specimen: Nasopharyngeal Swab  Result Value Ref Range Status   SARS Coronavirus 2 by RT PCR NEGATIVE NEGATIVE Final    Comment: (NOTE) SARS-CoV-2 target nucleic acids are NOT DETECTED. The SARS-CoV-2 RNA is generally detectable in upper respiratoy specimens during the acute phase of infection. The lowest concentration of SARS-CoV-2 viral copies this assay can detect is 131 copies/mL. A negative result does not preclude SARS-Cov-2 infection and should not be used as the sole basis for treatment or other patient management decisions. A negative result may occur with  improper specimen collection/handling, submission of specimen other than nasopharyngeal swab, presence of viral mutation(s) within the areas targeted by this assay, and inadequate number of viral  copies (<131 copies/mL). A negative result must be combined with clinical observations, patient history, and epidemiological information. The expected result is Negative. Fact Sheet for Patients:  PinkCheek.be Fact Sheet for Healthcare Providers:  GravelBags.it This test is not yet ap proved or cleared by the [place] FDA and  has been authorized for detection and/or diagnosis of SARS-CoV-2 by FDA under an Emergency Use Authorization (EUA). This EUA will remain  in effect (meaning this test can be used) for the duration of the COVID-19 declaration under Section 564(b)(1) of the Act, 21 U.S.C. section 360bbb-3(b)(1), unless the authorization is terminated or revoked sooner.    Influenza A by PCR NEGATIVE NEGATIVE Final   Influenza B by PCR NEGATIVE NEGATIVE Final    Comment: (NOTE) The Xpert Xpress SARS-CoV-2/FLU/RSV assay is intended as an aid in  the diagnosis of influenza from Nasopharyngeal swab specimens and  should not be used as a sole basis for treatment. Nasal washings and  aspirates are unacceptable for Xpert Xpress SARS-CoV-2/FLU/RSV  testing. Fact Sheet for Patients: PinkCheek.be Fact Sheet for Healthcare Providers: GravelBags.it This test is not yet approved or cleared by the [place] FDA and  has been authorized for detection and/or diagnosis of SARS-CoV-2 by  FDA under an Emergency Use Authorization (EUA). This EUA will remain  in effect (meaning this test can be used) for the duration of the  Covid-19 declaration under Section 564(b)(1) of the Act, 21  U.S.C. section 360bbb-3(b)(1), unless the authorization is  terminated or revoked. Performed at [facility], [address] [place], [place] [address]   Respiratory Panel by RT PCR (Flu A&B, Covid) - Nasopharyngeal Swab     Status: None   Collection Time: 02/20/19  9:55 AM    Specimen: Nasopharyngeal Swab  Result Value Ref Range Status   SARS Coronavirus 2 by RT PCR NEGATIVE NEGATIVE Final    Comment: (NOTE) SARS-CoV-2 target nucleic acids are NOT DETECTED. The SARS-CoV-2 RNA is generally detectable in upper respiratoy specimens during the acute phase of infection. The lowest concentration of SARS-CoV-2 viral copies this assay can detect is 131 copies/mL. A negative result does not preclude SARS-Cov-2 infection and should not be used as the sole basis for treatment or other patient management decisions. A negative result may occur with  improper specimen collection/handling, submission of specimen other than nasopharyngeal swab, presence of viral mutation(s) within the areas targeted by this assay, and inadequate number of viral copies (<131 copies/mL). A negative result must be combined with clinical observations, patient history, and epidemiological information. The expected result is Negative. Fact Sheet for Patients:  PinkCheek.be Fact Sheet for Healthcare Providers:  GravelBags.it This test is not yet ap proved or cleared by the [place] FDA and  has been authorized for detection and/or diagnosis of SARS-CoV-2 by FDA under an Emergency Use Authorization (EUA). This EUA will remain  in effect (meaning this test can be used) for the duration of the COVID-19 declaration under Section 564(b)(1) of the Act, 21 U.S.C. section 360bbb-3(b)(1), unless the authorization is terminated or revoked sooner.    Influenza A by PCR NEGATIVE NEGATIVE Final   Influenza B by PCR NEGATIVE NEGATIVE Final    Comment: (NOTE) The Xpert Xpress SARS-CoV-2/FLU/RSV assay is intended as an aid in  the diagnosis of influenza from Nasopharyngeal swab specimens and  should not be used as a sole basis for treatment. Nasal washings and  aspirates are unacceptable for Xpert Xpress SARS-CoV-2/FLU/RSV  testing. Fact Sheet  for Patients: PinkCheek.be Fact Sheet for Healthcare Providers: GravelBags.it This test is not yet approved or cleared by the [place] FDA and  has been authorized for detection and/or diagnosis of SARS-CoV-2 by  FDA under an Emergency Use Authorization (EUA). This EUA will remain  in effect (meaning this test can be used) for the duration of the  Covid-19 declaration under Section 564(b)(1) of the Act, 21  U.S.C. section 360bbb-3(b)(1), unless the authorization is  terminated or revoked. Performed at [facility], [address] [place], [place] [address]   Blood culture (routine x 2)     Status: None (Preliminary result)   Collection Time: 02/20/19 12:04 PM   Specimen: BLOOD  Result Value Ref Range Status   Specimen Description BLOOD BLOOD RIGHT HAND  Final   Special Requests   Final    BOTTLES DRAWN AEROBIC AND ANAEROBIC Blood Culture adequate volume   Culture  Setup Time   Final    GRAM POSITIVE COCCI IN BOTH AEROBIC AND ANAEROBIC BOTTLES CRITICAL RESULT CALLED TO, READ BACK BY AND VERIFIED WITH: SCOTT HALL AT [id number] 02/21/19 [facility]  Performed at [facility], [address] [place], [place] [address]    Culture [facility] POSITIVE COCCI  Final   Report Status PENDING  Incomplete  Blood culture (routine x 2)     Status: None (Preliminary result)   Collection Time: 02/20/19 12:33 PM   Specimen: BLOOD  Result Value Ref  Range Status   Specimen Description BLOOD RIGHT ANTECUBITAL  Final   Special Requests   Final    BOTTLES DRAWN AEROBIC AND ANAEROBIC Blood Culture adequate volume   Culture  Setup Time   Final    Organism ID to follow GRAM POSITIVE COCCI AEROBIC BOTTLE ONLY CRITICAL RESULT CALLED TO, READ BACK BY AND VERIFIED WITH: Rito Ehrlich AT [id number] 02/21/19 SDR Performed at [facility], [address], [place], [place] [address]    Culture [facility] POSITIVE COCCI  Final   Report Status  PENDING  Incomplete  Blood Culture ID Panel (Reflexed)     Status: Abnormal   Collection Time: 02/20/19 12:33 PM  Result Value Ref Range Status   Enterococcus species NOT DETECTED NOT DETECTED Final   Listeria monocytogenes NOT DETECTED NOT DETECTED Final   Staphylococcus species DETECTED (A) NOT DETECTED Final    Comment: CRITICAL RESULT CALLED TO, READ BACK BY AND VERIFIED WITH: Rito Ehrlich AT [id number] 02/21/19 SDR    Staphylococcus aureus (BCID) DETECTED (A) NOT DETECTED Final    Comment: Methicillin (oxacillin) susceptible Staphylococcus aureus (MSSA). Preferred therapy is anti staphylococcal beta lactam antibiotic (Cefazolin or Nafcillin), unless clinically contraindicated. CRITICAL RESULT CALLED TO, READ BACK BY AND VERIFIED WITH: Rito Ehrlich AT [id number] 02/21/19 SDR    Methicillin resistance NOT DETECTED NOT DETECTED Final   Streptococcus species NOT DETECTED NOT DETECTED Final   Streptococcus agalactiae NOT DETECTED NOT DETECTED Final   Streptococcus pneumoniae NOT DETECTED NOT DETECTED Final   Streptococcus pyogenes NOT DETECTED NOT DETECTED Final   Acinetobacter baumannii NOT DETECTED NOT DETECTED Final   Enterobacteriaceae species NOT DETECTED NOT DETECTED Final   Enterobacter cloacae complex NOT DETECTED NOT DETECTED Final   Escherichia coli NOT DETECTED NOT DETECTED Final   Klebsiella oxytoca NOT DETECTED NOT DETECTED Final   Klebsiella pneumoniae NOT DETECTED NOT DETECTED Final   Proteus species NOT DETECTED NOT DETECTED Final   Serratia marcescens NOT DETECTED NOT DETECTED Final   Haemophilus influenzae NOT DETECTED NOT DETECTED Final   Neisseria meningitidis NOT DETECTED NOT DETECTED Final   Pseudomonas aeruginosa NOT DETECTED NOT DETECTED Final   Candida albicans NOT DETECTED NOT DETECTED Final   Candida glabrata NOT DETECTED NOT DETECTED Final   Candida krusei NOT DETECTED NOT DETECTED Final   Candida parapsilosis NOT DETECTED NOT DETECTED Final   Candida tropicalis NOT  DETECTED NOT DETECTED Final    Comment: Performed at [facility], [address] [place], [place] [address]  C difficile quick scan w PCR reflex     Status: Abnormal   Collection Time: 02/20/19  6:16 PM   Specimen: STOOL  Result Value Ref Range Status   C Diff antigen POSITIVE (A) NEGATIVE Final   C Diff toxin NEGATIVE NEGATIVE Final   C Diff interpretation Results are indeterminate. See PCR results.  Final    Comment: Performed at [facility], [address] [place], [place] [address]  C. Diff by PCR, Reflexed     Status: None   Collection Time: 02/20/19  6:16 PM  Result Value Ref Range Status   Toxigenic C. Difficile by PCR NEGATIVE NEGATIVE Final    Comment: Patient is colonized with non toxigenic C. difficile. May not need treatment unless significant symptoms are present. Performed at [facility], [address] [place], [place] [address]    Studies/Results: CT ABDOMEN PELVIS WO CONTRAST  Result Date: 02/20/2019 CLINICAL DATA:  Abdomen pain fever postop EXAM: CT ABDOMEN AND PELVIS  WITHOUT CONTRAST TECHNIQUE: Multidetector CT imaging of the abdomen and pelvis was performed following the standard protocol without IV contrast. COMPARISON:  Chest x-ray 02/20/2019 FINDINGS: Lower chest: Lung bases demonstrate no acute consolidation or effusion. The heart size is within normal limits. Hepatobiliary: Gallbladder is not clearly identified and is presumably surgically absent. Extrahepatic common bile duct slightly enlarged at 1 cm. No focal hepatic abnormality Pancreas: Unremarkable. No pancreatic ductal dilatation or surrounding inflammatory changes. Spleen: Normal in size without focal abnormality. Adrenals/Urinary Tract: Adrenal glands are normal. No hydronephrosis. Low-attenuation lesions in the kidneys likely cysts. No ureteral stone. Distended urinary bladder Stomach/Bowel: Radiopaque material in the stomach. No dilated small bowel. No bowel wall thickening.  Sigmoid colon diverticula without acute inflammatory change. Appendix not well seen but no right lower quadrant inflammatory process. Vascular/Lymphatic: Nonaneurysmal aorta with mild aortic atherosclerosis. No significantly enlarged lymph nodes Reproductive: Small uterine calcifications, possible fibroids. No adnexal mass. Other: Negative for free air or free fluid. Musculoskeletal: Grade 1 anterolisthesis L4 on L5. Degenerative changes of the spine. No acute osseous abnormality. IMPRESSION: 1. No CT evidence for acute intra-abdominal or intrapelvic abnormality. 2. Sigmoid colon diverticular disease without acute inflammatory change. 3. Nonvisualized gallbladder presumably due to prior cholecystectomy. Prominent extrahepatic bile duct which may be secondary to postsurgical change though suggest correlation with LFTs. Electronically Signed   By: Donavan Foil M.D.   On: 02/20/2019 17:23   DG Chest Port 1 View  Result Date: 02/20/2019 CLINICAL DATA:  Chest pressure EXAM: PORTABLE CHEST 1 VIEW COMPARISON:  February 12, 2019 FINDINGS: Lungs are clear. Heart size and pulmonary vascularity are normal. No adenopathy. No pneumothorax. No bone lesions. IMPRESSION: Lungs clear.  Cardiac silhouette within normal limits. Electronically Signed   By: Lowella Grip III M.D.   On: 02/20/2019 12:16   Medications: I have reviewed the patient's current medications. Scheduled Meds: . calcium-vitamin D  2 tablet Oral Daily  . Chlorhexidine Gluconate Cloth  6 each Topical Daily  . loratadine  10 mg Oral Daily  . [START ON 02/24/2019] pantoprazole  40 mg Intravenous Q12H   Continuous Infusions: .  ceFAZolin (ANCEF) IV    . lactated ringers    . pantoprozole (PROTONIX) infusion 8 mg/hr (02/20/19 2355)  . phenylephrine (NEO-SYNEPHRINE) Adult infusion 40 mcg/min (02/21/19 0837)   PRN Meds:.bisacodyl, hydroxypropyl methylcellulose / hypromellose, ondansetron CBC Latest Ref Rng & Units 02/21/2019 02/21/2019 02/20/2019  WBC  4.0 - 10.5 K/uL 35.2(H) 34.9(H) 48.0(H)  Hemoglobin 12.0 - 15.0 g/dL 8.5(L) 7.2(L) 5.6(L)  Hematocrit 36.0 - 46.0 % 25.6(L) 21.7(L) 18.1(L)  Platelets 150 - 400 K/uL 784(H) 770(H) 1,009(HH)    CMP Latest Ref Rng & Units 02/21/2019 02/21/2019 02/20/2019  Glucose 70 - 99 mg/dL 137(H) 139(H) 135(H)  BUN 8 - 23 mg/dL 98(H) 96(H) 99(H)  Creatinine 0.44 - 1.00 mg/dL 1.88(H) 1.71(H) 1.92(H)  Sodium 135 - 145 mmol/L 141 137 136  Potassium 3.5 - 5.1 mmol/L 3.2(L) 3.4(L) 3.4(L)  Chloride 98 - 111 mmol/L 109 104 101  CO2 22 - 32 mmol/L 18(L) 22 17(L)  Calcium 8.9 - 10.3 mg/dL 8.9 8.8(L) 9.4  Total Protein 6.5 - 8.1 g/dL - 5.1(L) 6.1(L)  Total Bilirubin 0.3 - 1.2 mg/dL - 0.7 0.8  Alkaline Phos 38 - 126 U/L - 89 107  AST 15 - 41 U/L - 22 26  ALT 0 - 44 U/L - 18 18    Assessment: Active Problems:   Anemia  Jasmine Morrow is a 74 y.o. y/o female  with a recent history of ankle fracture presents to the emergency room with chest pain nausea, shortness of breath found to be anemic and melena on exam.  White cell count close to 50,000 and a platelet count over thousand(improving overnight).  Hypotensive, AKI.  Likely combination of sepsis and anemia which is likely acute.  Per history not on any NSAIDs but her past medication includes 325 mg of aspirin. She does have melena. Staph bacteremia .  Plan 1.  EGD when hemodynamically stable - presently systolics still in the [id number] . IF there is drop in Hb and concern for ongoing bleeding then will need tagged RBC scan . Monitor CBC and transfuse. Avoid high dose asprin if possible. Continue PPI  2. Sepsis being treated empirically with antibiotics. CT scan showed no clear colitis or abdominal source. ID following . I do note GI stool PCR been ordered.      LOS: 1 day   Jonathon Bellows, MD 02/21/2019, 1:28 PM

## 2019-02-21 NOTE — Progress Notes (Signed)
PHARMACY - PHYSICIAN COMMUNICATION CRITICAL VALUE ALERT - BLOOD CULTURE IDENTIFICATION (BCID)  Jasmine Morrow is an 74 y.o. female who presented to [facility] on 02/20/2019  Assessment:  3/4 bottles MSSA  Name of physician (or Provider) Contacted: Drs. Nada Boozer  Current antibiotics: vanc/ceftriaxone/metronidazole  Changes to prescribed antibiotics recommended: cefazolin  Results for orders placed or performed during the hospital encounter of 02/20/19  Blood Culture ID Panel (Reflexed) (Collected: 02/20/2019 12:33 PM)  Result Value Ref Range   Enterococcus species NOT DETECTED NOT DETECTED   Listeria monocytogenes NOT DETECTED NOT DETECTED   Staphylococcus species DETECTED (A) NOT DETECTED   Staphylococcus aureus (BCID) DETECTED (A) NOT DETECTED   Methicillin resistance NOT DETECTED NOT DETECTED   Streptococcus species NOT DETECTED NOT DETECTED   Streptococcus agalactiae NOT DETECTED NOT DETECTED   Streptococcus pneumoniae NOT DETECTED NOT DETECTED   Streptococcus pyogenes NOT DETECTED NOT DETECTED   Acinetobacter baumannii NOT DETECTED NOT DETECTED   Enterobacteriaceae species NOT DETECTED NOT DETECTED   Enterobacter cloacae complex NOT DETECTED NOT DETECTED   Escherichia coli NOT DETECTED NOT DETECTED   Klebsiella oxytoca NOT DETECTED NOT DETECTED   Klebsiella pneumoniae NOT DETECTED NOT DETECTED   Proteus species NOT DETECTED NOT DETECTED   Serratia marcescens NOT DETECTED NOT DETECTED   Haemophilus influenzae NOT DETECTED NOT DETECTED   Neisseria meningitidis NOT DETECTED NOT DETECTED   Pseudomonas aeruginosa NOT DETECTED NOT DETECTED   Candida albicans NOT DETECTED NOT DETECTED   Candida glabrata NOT DETECTED NOT DETECTED   Candida krusei NOT DETECTED NOT DETECTED   Candida parapsilosis NOT DETECTED NOT DETECTED   Candida tropicalis NOT DETECTED NOT DETECTED    Tawnya Crook, PharmD 02/21/2019  12:58 PM

## 2019-02-21 NOTE — Progress Notes (Signed)
PROGRESS NOTE    Jasmine Morrow  [id number] DOB: May 13, 1945 DOA: 02/20/2019 PCP: Derinda Late, MD   Brief Narrative:  Jasmine Morrow  is a 74 y.o. female with a known history of recent ankle fracture on 1/7 who comes from [place] for chest pain. Found to have hypotension and anemia secondary to melena. Admitted for septic/hemorrhagic shock initially requiring pressor support. Now off the pressors.  Subjective: Patient was feeling thirsty when seen this morning.  She was n.p.o. and asking for some water or ice chips.  She was accompanied by her husband at bedside.  Assessment & Plan:   Active Problems:   Anemia  Septic/hemorrhagic shock.  Blood pressure improved although remain little softer.  Off the pressors now. Blood cultures growing MSSA. Orthopedic was consulted to remove the dressing to check surgical site. -Initially treated with broad-spectrum antibiotics now deescalated to cefazolin. -Leukocytosis improving. -Lactic acidosis resolved.  Acute blood loss anemia/melena.  Hemoglobin found to be at 5.6 on admission. Now improved to 8.3 after 2 units of packed RBCs.  Patient was on Eliquis after ankle surgery. -Keep holding Eliquis. -GI was consulted-most likely EGD tomorrow. -Continue with IV Protonix. -Continue to monitor and transfuse if below 8.  AKI.  Most likely secondary to shock. Creatinine improving. -Continue IV hydration. -Avoid nephrotoxic. -Continue to monitor.  Right ankle fracture s/p recent ORIF -Ortho consult to remove cast to evaluate surgical site.  Objective: Vitals:   02/21/19 1600 02/21/19 1700 02/21/19 1729 02/21/19 1800  BP: 114/78 (!) 107/46  (!) 108/49  Pulse:      Resp: 16 17  (!) 23  Temp:   97.7 F (36.5 C)   TempSrc:   Axillary   SpO2:        Intake/Output Summary (Last 24 hours) at 02/21/2019 1926 Last data filed at 02/21/2019 1733 Gross per 24 hour  Intake 2313.05 ml  Output 1925 ml  Net 388.05 ml   There  were no vitals filed for this visit.  Examination:  General exam: Appears calm and comfortable  Respiratory system: Clear to auscultation. Respiratory effort normal. Cardiovascular system: S1 & S2 heard, RRR. No JVD, murmurs, rubs, gallops or clicks. Gastrointestinal system: Soft, nontender, nondistended, bowel sounds positive. Central nervous system: Alert and oriented. No focal neurological deficits.Symmetric 5 x 5 power. Extremities: Right ankle with Ace wrap, no edema, no cyanosis, pulses intact  Skin: No rashes, lesions or ulcers Psychiatry: Judgement and insight appear normal. Mood & affect appropriate.    DVT prophylaxis: SCDs Code Status: Full Family Communication: Husband was updated at bedside. Disposition Plan: Pending improvement.  Consultants:   PCCM  ID  Orthopedic  Procedures:  Antimicrobials:  Cefazolin  Data Reviewed: I have personally reviewed following labs and imaging studies  CBC: Recent Labs  Lab 02/20/19 0955 02/21/19 0138 02/21/19 0848 02/21/19 1353  WBC 48.0* 34.9* 35.2*  --   NEUTROABS 40.6*  --  30.5*  --   HGB 5.6* 7.2* 8.5* 8.3*  HCT 18.1* 21.7* 25.6* 25.3*  MCV 89.6 89.7 86.8  --   PLT 1,009* 770* 784*  --    Basic Metabolic Panel: Recent Labs  Lab 02/20/19 0955 02/21/19 0138 02/21/19 0848  NA 136 137 141  K 3.4* 3.4* 3.2*  CL 101 104 109  CO2 17* 22 18*  GLUCOSE 135* 139* 137*  BUN 99* 96* 98*  CREATININE 1.92* 1.71* 1.88*  CALCIUM 9.4 8.8* 8.9   GFR: Estimated Creatinine Clearance: 40 mL/min (A) (by C-G formula  based on SCr of 1.88 mg/dL (H)). Liver Function Tests: Recent Labs  Lab 02/20/19 0955 02/21/19 0138  AST 26 22  ALT 18 18  ALKPHOS 107 89  BILITOT 0.8 0.7  PROT 6.1* 5.1*  ALBUMIN 2.2* 2.0*   Recent Labs  Lab 02/20/19 0955  LIPASE 66*   No results for input(s): AMMONIA in the last 168 hours. Coagulation Profile: Recent Labs  Lab 02/21/19 0138  INR 1.4*   Cardiac Enzymes: No results for  input(s): CKTOTAL, CKMB, CKMBINDEX, TROPONINI in the last 168 hours. BNP (last 3 results) No results for input(s): PROBNP in the last 8760 hours. HbA1C: No results for input(s): HGBA1C in the last 72 hours. CBG: Recent Labs  Lab 02/20/19 1908  GLUCAP 127*   Lipid Profile: No results for input(s): CHOL, HDL, LDLCALC, TRIG, CHOLHDL, LDLDIRECT in the last 72 hours. Thyroid Function Tests: No results for input(s): TSH, T4TOTAL, FREET4, T3FREE, THYROIDAB in the last 72 hours. Anemia Panel: Recent Labs    02/20/19 0955 02/21/19 0138  FERRITIN 116  --   TIBC 407  --   IRON 37  --   RETICCTPCT  --  8.1*   Sepsis Labs: Recent Labs  Lab 02/20/19 1159 02/20/19 1609 02/21/19 0138 02/21/19 0700  PROCALCITON  --   --  2.30 2.26  LATICACIDVEN 2.4* 5.1* 1.1  --     Recent Results (from the past 240 hour(s))  Respiratory Panel by RT PCR (Flu A&B, Covid) - Nasopharyngeal Swab     Status: None   Collection Time: 02/13/19  8:52 AM   Specimen: Nasopharyngeal Swab  Result Value Ref Range Status   SARS Coronavirus 2 by RT PCR NEGATIVE NEGATIVE Final    Comment: (NOTE) SARS-CoV-2 target nucleic acids are NOT DETECTED. The SARS-CoV-2 RNA is generally detectable in upper respiratoy specimens during the acute phase of infection. The lowest concentration of SARS-CoV-2 viral copies this assay can detect is 131 copies/mL. A negative result does not preclude SARS-Cov-2 infection and should not be used as the sole basis for treatment or other patient management decisions. A negative result may occur with  improper specimen collection/handling, submission of specimen other than nasopharyngeal swab, presence of viral mutation(s) within the areas targeted by this assay, and inadequate number of viral copies (<131 copies/mL). A negative result must be combined with clinical observations, patient history, and epidemiological information. The expected result is Negative. Fact Sheet for Patients:    PinkCheek.be Fact Sheet for Healthcare Providers:  GravelBags.it This test is not yet ap proved or cleared by the [place] FDA and  has been authorized for detection and/or diagnosis of SARS-CoV-2 by FDA under an Emergency Use Authorization (EUA). This EUA will remain  in effect (meaning this test can be used) for the duration of the COVID-19 declaration under Section 564(b)(1) of the Act, 21 U.S.C. section 360bbb-3(b)(1), unless the authorization is terminated or revoked sooner.    Influenza A by PCR NEGATIVE NEGATIVE Final   Influenza B by PCR NEGATIVE NEGATIVE Final    Comment: (NOTE) The Xpert Xpress SARS-CoV-2/FLU/RSV assay is intended as an aid in  the diagnosis of influenza from Nasopharyngeal swab specimens and  should not be used as a sole basis for treatment. Nasal washings and  aspirates are unacceptable for Xpert Xpress SARS-CoV-2/FLU/RSV  testing. Fact Sheet for Patients: PinkCheek.be Fact Sheet for Healthcare Providers: GravelBags.it This test is not yet approved or cleared by the [place] FDA and  has been authorized for detection and/or  diagnosis of SARS-CoV-2 by  FDA under an Emergency Use Authorization (EUA). This EUA will remain  in effect (meaning this test can be used) for the duration of the  Covid-19 declaration under Section 564(b)(1) of the Act, 21  U.S.C. section 360bbb-3(b)(1), unless the authorization is  terminated or revoked. Performed at [facility], [address] [place], [place] [address]   Respiratory Panel by RT PCR (Flu A&B, Covid) - Nasopharyngeal Swab     Status: None   Collection Time: 02/20/19  9:55 AM   Specimen: Nasopharyngeal Swab  Result Value Ref Range Status   SARS Coronavirus 2 by RT PCR NEGATIVE NEGATIVE Final    Comment: (NOTE) SARS-CoV-2 target nucleic acids are NOT DETECTED. The SARS-CoV-2  RNA is generally detectable in upper respiratoy specimens during the acute phase of infection. The lowest concentration of SARS-CoV-2 viral copies this assay can detect is 131 copies/mL. A negative result does not preclude SARS-Cov-2 infection and should not be used as the sole basis for treatment or other patient management decisions. A negative result may occur with  improper specimen collection/handling, submission of specimen other than nasopharyngeal swab, presence of viral mutation(s) within the areas targeted by this assay, and inadequate number of viral copies (<131 copies/mL). A negative result must be combined with clinical observations, patient history, and epidemiological information. The expected result is Negative. Fact Sheet for Patients:  PinkCheek.be Fact Sheet for Healthcare Providers:  GravelBags.it This test is not yet ap proved or cleared by the [place] FDA and  has been authorized for detection and/or diagnosis of SARS-CoV-2 by FDA under an Emergency Use Authorization (EUA). This EUA will remain  in effect (meaning this test can be used) for the duration of the COVID-19 declaration under Section 564(b)(1) of the Act, 21 U.S.C. section 360bbb-3(b)(1), unless the authorization is terminated or revoked sooner.    Influenza A by PCR NEGATIVE NEGATIVE Final   Influenza B by PCR NEGATIVE NEGATIVE Final    Comment: (NOTE) The Xpert Xpress SARS-CoV-2/FLU/RSV assay is intended as an aid in  the diagnosis of influenza from Nasopharyngeal swab specimens and  should not be used as a sole basis for treatment. Nasal washings and  aspirates are unacceptable for Xpert Xpress SARS-CoV-2/FLU/RSV  testing. Fact Sheet for Patients: PinkCheek.be Fact Sheet for Healthcare Providers: GravelBags.it This test is not yet approved or cleared by the [place] FDA  and  has been authorized for detection and/or diagnosis of SARS-CoV-2 by  FDA under an Emergency Use Authorization (EUA). This EUA will remain  in effect (meaning this test can be used) for the duration of the  Covid-19 declaration under Section 564(b)(1) of the Act, 21  U.S.C. section 360bbb-3(b)(1), unless the authorization is  terminated or revoked. Performed at [facility], [address] [place], [place] [address]   Blood culture (routine x 2)     Status: None (Preliminary result)   Collection Time: 02/20/19 12:04 PM   Specimen: BLOOD  Result Value Ref Range Status   Specimen Description BLOOD BLOOD RIGHT HAND  Final   Special Requests   Final    BOTTLES DRAWN AEROBIC AND ANAEROBIC Blood Culture adequate volume   Culture  Setup Time   Final    GRAM POSITIVE COCCI IN BOTH AEROBIC AND ANAEROBIC BOTTLES CRITICAL RESULT CALLED TO, READ BACK BY AND VERIFIED WITH: SCOTT HALL AT [id number] 02/21/19 [facility]  Performed at [facility], [address] [place], [place] [address]    Culture  GRAM POSITIVE COCCI  Final   Report Status PENDING  Incomplete  Blood culture (routine x 2)     Status: None (Preliminary result)   Collection Time: 02/20/19 12:33 PM   Specimen: BLOOD  Result Value Ref Range Status   Specimen Description BLOOD RIGHT ANTECUBITAL  Final   Special Requests   Final    BOTTLES DRAWN AEROBIC AND ANAEROBIC Blood Culture adequate volume   Culture  Setup Time   Final    GRAM POSITIVE COCCI IN BOTH AEROBIC AND ANAEROBIC BOTTLES CRITICAL RESULT CALLED TO, READ BACK BY AND VERIFIED WITH: Rito Ehrlich AT [id number] 02/21/19 [place] Performed at [facility], [address], [place], [place] [address]    Culture [facility] POSITIVE COCCI  Final   Report Status PENDING  Incomplete  Blood Culture ID Panel (Reflexed)     Status: Abnormal   Collection Time: 02/20/19 12:33 PM  Result Value Ref Range Status   Enterococcus species NOT DETECTED NOT DETECTED Final   Listeria  monocytogenes NOT DETECTED NOT DETECTED Final   Staphylococcus species DETECTED (A) NOT DETECTED Final    Comment: CRITICAL RESULT CALLED TO, READ BACK BY AND VERIFIED WITH: Rito Ehrlich AT [id number] 02/21/19 SDR    Staphylococcus aureus (BCID) DETECTED (A) NOT DETECTED Final    Comment: Methicillin (oxacillin) susceptible Staphylococcus aureus (MSSA). Preferred therapy is anti staphylococcal beta lactam antibiotic (Cefazolin or Nafcillin), unless clinically contraindicated. CRITICAL RESULT CALLED TO, READ BACK BY AND VERIFIED WITH: Rito Ehrlich AT [id number] 02/21/19 SDR    Methicillin resistance NOT DETECTED NOT DETECTED Final   Streptococcus species NOT DETECTED NOT DETECTED Final   Streptococcus agalactiae NOT DETECTED NOT DETECTED Final   Streptococcus pneumoniae NOT DETECTED NOT DETECTED Final   Streptococcus pyogenes NOT DETECTED NOT DETECTED Final   Acinetobacter baumannii NOT DETECTED NOT DETECTED Final   Enterobacteriaceae species NOT DETECTED NOT DETECTED Final   Enterobacter cloacae complex NOT DETECTED NOT DETECTED Final   Escherichia coli NOT DETECTED NOT DETECTED Final   Klebsiella oxytoca NOT DETECTED NOT DETECTED Final   Klebsiella pneumoniae NOT DETECTED NOT DETECTED Final   Proteus species NOT DETECTED NOT DETECTED Final   Serratia marcescens NOT DETECTED NOT DETECTED Final   Haemophilus influenzae NOT DETECTED NOT DETECTED Final   Neisseria meningitidis NOT DETECTED NOT DETECTED Final   Pseudomonas aeruginosa NOT DETECTED NOT DETECTED Final   Candida albicans NOT DETECTED NOT DETECTED Final   Candida glabrata NOT DETECTED NOT DETECTED Final   Candida krusei NOT DETECTED NOT DETECTED Final   Candida parapsilosis NOT DETECTED NOT DETECTED Final   Candida tropicalis NOT DETECTED NOT DETECTED Final    Comment: Performed at [facility], [address] [place], [place] [address]  C difficile quick scan w PCR reflex     Status: Abnormal   Collection Time: 02/20/19  6:16  PM   Specimen: STOOL  Result Value Ref Range Status   C Diff antigen POSITIVE (A) NEGATIVE Final   C Diff toxin NEGATIVE NEGATIVE Final   C Diff interpretation Results are indeterminate. See PCR results.  Final    Comment: Performed at [facility], [address] [place], [place] [address]  C. Diff by PCR, Reflexed     Status: None   Collection Time: 02/20/19  6:16 PM  Result Value Ref Range Status   Toxigenic C. Difficile by PCR NEGATIVE NEGATIVE Final    Comment: Patient is colonized with non toxigenic C. difficile. May not need treatment unless significant  symptoms are present. Performed at [facility], [address] [place], [place] [address]      Radiology Studies: CT ABDOMEN PELVIS WO CONTRAST  Result Date: 02/20/2019 CLINICAL DATA:  Abdomen pain fever postop EXAM: CT ABDOMEN AND PELVIS WITHOUT CONTRAST TECHNIQUE: Multidetector CT imaging of the abdomen and pelvis was performed following the standard protocol without IV contrast. COMPARISON:  Chest x-ray 02/20/2019 FINDINGS: Lower chest: Lung bases demonstrate no acute consolidation or effusion. The heart size is within normal limits. Hepatobiliary: Gallbladder is not clearly identified and is presumably surgically absent. Extrahepatic common bile duct slightly enlarged at 1 cm. No focal hepatic abnormality Pancreas: Unremarkable. No pancreatic ductal dilatation or surrounding inflammatory changes. Spleen: Normal in size without focal abnormality. Adrenals/Urinary Tract: Adrenal glands are normal. No hydronephrosis. Low-attenuation lesions in the kidneys likely cysts. No ureteral stone. Distended urinary bladder Stomach/Bowel: Radiopaque material in the stomach. No dilated small bowel. No bowel wall thickening. Sigmoid colon diverticula without acute inflammatory change. Appendix not well seen but no right lower quadrant inflammatory process. Vascular/Lymphatic: Nonaneurysmal aorta with mild aortic atherosclerosis.  No significantly enlarged lymph nodes Reproductive: Small uterine calcifications, possible fibroids. No adnexal mass. Other: Negative for free air or free fluid. Musculoskeletal: Grade 1 anterolisthesis L4 on L5. Degenerative changes of the spine. No acute osseous abnormality. IMPRESSION: 1. No CT evidence for acute intra-abdominal or intrapelvic abnormality. 2. Sigmoid colon diverticular disease without acute inflammatory change. 3. Nonvisualized gallbladder presumably due to prior cholecystectomy. Prominent extrahepatic bile duct which may be secondary to postsurgical change though suggest correlation with LFTs. Electronically Signed   By: Donavan Foil M.D.   On: 02/20/2019 17:23   DG Chest Port 1 View  Result Date: 02/20/2019 CLINICAL DATA:  Chest pressure EXAM: PORTABLE CHEST 1 VIEW COMPARISON:  February 12, 2019 FINDINGS: Lungs are clear. Heart size and pulmonary vascularity are normal. No adenopathy. No pneumothorax. No bone lesions. IMPRESSION: Lungs clear.  Cardiac silhouette within normal limits. Electronically Signed   By: Lowella Grip III M.D.   On: 02/20/2019 12:16    Scheduled Meds: . calcium-vitamin D  2 tablet Oral Daily  . Chlorhexidine Gluconate Cloth  6 each Topical Daily  . loratadine  10 mg Oral Daily  . [START ON 02/24/2019] pantoprazole  40 mg Intravenous Q12H   Continuous Infusions: .  ceFAZolin (ANCEF) IV 2 g (02/21/19 1602)  . lactated ringers    . pantoprozole (PROTONIX) infusion 8 mg/hr (02/20/19 2355)  . phenylephrine (NEO-SYNEPHRINE) Adult infusion 20 mcg/min (02/21/19 1557)     LOS: 1 day   Time spent: 45 minutes.  I personally reviewed her chart.  Lorella Nimrod, MD [facility] Pager [phone]  If 7PM-7AM, please contact night-coverage [email or web address] Password [facility] 02/21/2019, 7:26 PM   This record has been created using Dragon voice recognition software. Errors have been sought and corrected,but may not always be located. Such creation errors  do not reflect on the standard of care.

## 2019-02-21 NOTE — Progress Notes (Signed)
Name: ALACIA PASTOREK MRN: [id number] DOB: 11-20-1945    ADMISSION DATE:  02/20/2019 CONSULTATION DATE:  02/20/2019  REFERRING MD :  Dr. Manuella Ghazi  CHIEF COMPLAINT:  Chest pain  BRIEF PATIENT DESCRIPTION:  74 y.o. Female admitted 02/20/19 with Hemorrhagic shock in setting of Acute blood loss anemia due to GI bleed, along with questionable sepsis/septic shock of unknown etiology.  GI and ID following.  SIGNIFICANT EVENTS  1/20- Admission to Stepdown 1/20- GI, ID, PCCM consulted  STUDIES:  1/20- CXR>> Lungs clear.  Cardiac silhouette within normal limits. 1/20- CT Abdomen & Pelvis w/o contrast>> 1. No CT evidence for acute intra-abdominal or intrapelvic abnormality. 2. Sigmoid colon diverticular disease without acute inflammatory change. 3. Nonvisualized gallbladder presumably due to prior cholecystectomy. Prominent extrahepatic bile duct which may be secondary to postsurgical change though suggest correlation with LFTs.  CULTURES: SARS-CoV-2 PCR 1/20>> Negative Influenza PCR 1/20>> Negative Blood cultures x2 1/20>> Urine 1/20>> Stool 1/20>> C. Diff antigen +, C. Diff toxin - (results indeterminate)  ANTIBIOTICS: Cefepime 1/20>> Flagyl 1/20>> Vancomycin 1/20>>  HISTORY OF PRESENT ILLNESS:   Mrs. Kuramoto is a 74 year old female with a past medical history notable for hypertension and recent right ankle fracture with ORIF who presented to [facility] ED on 02/20/2019 with complaints of chest pain.  EMS was picking her up today for her Orthopedic appointment when she began to complain of chest discomfort, and was noted to be hypoxic with O2 sats ranging from the 80's to 90's.   Upon presentation to the ED her BP was soft with BP 104/57, HR 94, afebrile.  Initial work-up in the ED revealed anemia with hemoglobin of 5.6, WBC 48, platelets 1009, lactic acid 2.4, BNP 86, high-sensitivity troponin 35, BUN 99, creatinine 1.92, bicarb 17, anion gap 18.  Upon exam she was noted to be positive for  melena.  Chest x-ray without evidence of pneumonia, urinalysis without evidence of UTI.  CT abdomen and pelvis negative for any acute intra-abdominal abnormality.  Her COVID-19 PCR is negative, influenza PCR is negative, and C. difficile antigen positive but C. difficile toxin negative (results indeterminate for C. Difficile).  She is admitted to stepdown unit by hospitalist for further work-up and treatment of hemorrhagic shock in the setting of acute blood loss anemia due to GI bleed and questionable septic shock of unknown etiology.  GI and ID are following.  PCCM was consulted for assistance in managing hypotension.  PAST MEDICAL HISTORY :   has a past medical history of Hypertension and Seasonal allergies.  has a past surgical history that includes Replacement total knee (Bilateral, 2008); Cesarean section; Tumor removal; Dilation and curettage of uterus; ORIF ankle fracture (Right, 02/08/2019); and Syndesmosis repair (Right, 02/08/2019). Prior to Admission medications   Medication Sig Start Date End Date Taking? Authorizing Provider  acetaminophen (TYLENOL) 500 MG tablet Take 1,000 mg by mouth every 6 (six) hours as needed for mild pain or headache.    Yes [provider]  albuterol (PROVENTIL HFA;VENTOLIN HFA) 108 (90 Base) MCG/ACT inhaler Inhale 2 puffs into the lungs every 4 (four) hours as needed. Patient taking differently: Inhale 2 puffs into the lungs every 4 (four) hours as needed for wheezing or shortness of breath.  05/09/16  Yes Wilhelmina Mcardle, MD  aspirin EC 325 MG tablet Take 1 tablet (325 mg total) by mouth daily. Patient taking differently: Take 325 mg by mouth 2 (two) times daily.  02/13/19  Yes Duanne Guess, PA-C  azithromycin (ZITHROMAX) 250  MG tablet Take 250 mg by mouth daily. 02/15/19 02/20/19 Yes [provider]  bisacodyl (DULCOLAX) 10 MG suppository Place 1 suppository (10 mg total) rectally daily as needed for moderate constipation. 02/10/19  Yes Duanne Guess, PA-C  Calcium-Vitamin D 600-200 MG-UNIT per tablet Take 2 tablets by mouth daily.    Yes [provider]  carvedilol (COREG) 3.125 MG tablet Take 3.125 mg by mouth 2 (two) times daily. 12/07/18  Yes [provider]  cetirizine (ZYRTEC) 5 MG tablet Take 10 mg by mouth daily.    Yes [provider]  cyclobenzaprine (FLEXERIL) 10 MG tablet Take 10 mg by mouth daily as needed for muscle spasms.   Yes [provider]  fluocinonide cream (LIDEX) [id number] % Apply 1 application topically 3 (three) times daily as needed (mouth sores). Apply to mouth lesions as needed 01/17/19  Yes [provider]  hydroxypropyl methylcellulose / hypromellose (ISOPTO TEARS / GONIOVISC) 2.5 % ophthalmic solution Place 1 drop into both eyes every 8 (eight) hours as needed for dry eyes.    Yes [provider]  Multiple Vitamin (MULTIVITAMIN WITH MINERALS) TABS tablet Take 1 tablet by mouth daily.   Yes [provider]  Multiple Vitamins-Minerals (PRESERVISION AREDS 2 PO) Take 1 tablet by mouth 2 (two) times daily.   Yes [provider]  omeprazole (PRILOSEC) 20 MG capsule Take 20 mg by mouth 2 (two) times daily as needed (Heartburn).  04/23/15  Yes [provider]  ondansetron (ZOFRAN) 8 MG tablet Take 1 tablet (8 mg total) by mouth every 8 (eight) hours as needed for nausea or vomiting. 02/06/19  Yes Sable Feil, PA-C  traMADol (ULTRAM) 50 MG tablet Take 1-2 tablets (50-100 mg total) by mouth every 6 (six) hours as needed for severe pain. 02/10/19  Yes Duanne Guess, PA-C  triamterene-hydrochlorothiazide (MAXZIDE-25) 37.5-25 MG tablet Take 1 tablet by mouth daily. 03/21/18  Yes [provider]   Allergies  Allergen Reactions  . Celecoxib Nausea Only  . Hydrocodone-Acetaminophen Nausea And Vomiting  . Meloxicam Nausea Only  . Morphine Sulfate Er Beads Itching  . Naproxen Swelling  . Rofecoxib Nausea Only  . Sulfa Antibiotics  Other (See Comments)    Reaction: unknown  . Ciprofloxacin Rash  . Nickel Rash  . Tramadol-Acetaminophen Nausea Only and Rash    FAMILY HISTORY:  family history includes Bone cancer in her mother; COPD in her father. SOCIAL HISTORY:  reports that she quit smoking about 28 years ago. Her smoking use included cigarettes. She has a 60.00 pack-year smoking history. She has never used smokeless tobacco. She reports that she does not drink alcohol or use drugs.   COVID-19 DISASTER DECLARATION:  FULL CONTACT PHYSICAL EXAMINATION WAS NOT POSSIBLE DUE TO TREATMENT OF COVID-19 AND  CONSERVATION OF PERSONAL PROTECTIVE EQUIPMENT, LIMITED EXAM FINDINGS INCLUDE-  Patient assessed or the symptoms described in the history of present illness.  In the context of the Global COVID-19 pandemic, which necessitated consideration that the patient might be at risk for infection with the SARS-CoV-2 virus that causes COVID-19, Institutional protocols and algorithms that pertain to the evaluation of patients at risk for COVID-19 are in a state of rapid change based on information released by regulatory bodies including the CDC and federal and state organizations. These policies and algorithms were followed during the patient's care while in hospital.  REVIEW OF SYSTEMS:  Positives in BOLD: Pt currently denies all complaints Constitutional: Negative for fever, chills, weight loss,  malaise/fatigue and diaphoresis.  HENT: Negative for hearing loss, ear pain, nosebleeds, congestion, sore throat, neck pain, tinnitus and ear discharge.   Eyes: Negative for blurred vision, double vision, photophobia, pain, discharge and redness.  Respiratory: Negative for cough, hemoptysis, sputum production, shortness of breath, wheezing and stridor.   Cardiovascular: Negative for chest pain, palpitations, orthopnea, claudication, leg swelling and PND.  Gastrointestinal: Negative for heartburn, nausea, vomiting, abdominal pain, diarrhea,  constipation, blood in stool and melena.  Genitourinary: Negative for dysuria, urgency, frequency, hematuria and flank pain.  Musculoskeletal: Negative for myalgias, back pain, joint pain and falls.  Skin: Negative for itching and rash.  Neurological: Negative for dizziness, tingling, tremors, sensory change, speech change, focal weakness, seizures, loss of consciousness, weakness and headaches.  Endo/Heme/Allergies: Negative for environmental allergies and polydipsia. Does not bruise/bleed easily.  SUBJECTIVE:  Denies chest pain, SOB, cough, dizziness, abdominal pain, N/V, fever/chills Reports that it is cool in the room Having intermittent melena as reported by nursing  VITAL SIGNS: Temp:  [96.9 F (36.1 C)-98.3 F (36.8 C)] 97.7 F (36.5 C) (01/21 1729) Pulse Rate:  [60-105] 105 (01/21 1500) Resp:  [10-24] 23 (01/21 1800) BP: (52-130)/(37-110) 108/49 (01/21 1800) SpO2:  [58 %-100 %] 86 % (01/21 1500)  PHYSICAL EXAMINATION: General:  Acute on chronically ill appearing female, laying in bed, on Nasal cannula, in NAD Neuro:  Awake, A&O x3, follows commands, no focal deficits, speech clear HEENT:  Atraumatic, normocephalic, neck supple, no JVD Cardiovascular:  RRR, s1s2, no M/R/G Lungs:  Clear to auscultation bilaterally, no wheezing or rales, even, nonlabored, normal effort Abdomen:  Obese, soft, nontender, nondistended, no guarding or rebound tenderness, BS hypoactive Musculoskeletal:  No deformites Skin:  Warm and dry.  No obvious rashes, lesions, or ulcerations  Recent Labs  Lab 02/20/19 0955 02/21/19 0138 02/21/19 0848  NA 136 137 141  K 3.4* 3.4* 3.2*  CL 101 104 109  CO2 17* 22 18*  BUN 99* 96* 98*  CREATININE 1.92* 1.71* 1.88*  GLUCOSE 135* 139* 137*   Recent Labs  Lab 02/20/19 0955 02/20/19 0955 02/21/19 0138 02/21/19 0848 02/21/19 1353  HGB 5.6*   < > 7.2* 8.5* 8.3*  HCT 18.1*   < > 21.7* 25.6* 25.3*  WBC 48.0*  --  34.9* 35.2*  --   PLT 1,009*  --   770* 784*  --    < > = values in this interval not displayed.   CT ABDOMEN PELVIS WO CONTRAST  Result Date: 02/20/2019 CLINICAL DATA:  Abdomen pain fever postop EXAM: CT ABDOMEN AND PELVIS WITHOUT CONTRAST TECHNIQUE: Multidetector CT imaging of the abdomen and pelvis was performed following the standard protocol without IV contrast. COMPARISON:  Chest x-ray 02/20/2019 FINDINGS: Lower chest: Lung bases demonstrate no acute consolidation or effusion. The heart size is within normal limits. Hepatobiliary: Gallbladder is not clearly identified and is presumably surgically absent. Extrahepatic common bile duct slightly enlarged at 1 cm. No focal hepatic abnormality Pancreas: Unremarkable. No pancreatic ductal dilatation or surrounding inflammatory changes. Spleen: Normal in size without focal abnormality. Adrenals/Urinary Tract: Adrenal glands are normal. No hydronephrosis. Low-attenuation lesions in the kidneys likely cysts. No ureteral stone. Distended urinary bladder Stomach/Bowel: Radiopaque material in the stomach. No dilated small bowel. No bowel wall thickening. Sigmoid colon diverticula without acute inflammatory change. Appendix not well seen but no right lower quadrant inflammatory process. Vascular/Lymphatic: Nonaneurysmal aorta with mild aortic atherosclerosis. No significantly enlarged lymph nodes Reproductive: Small uterine calcifications, possible fibroids. No adnexal mass. Other:  Negative for free air or free fluid. Musculoskeletal: Grade 1 anterolisthesis L4 on L5. Degenerative changes of the spine. No acute osseous abnormality. IMPRESSION: 1. No CT evidence for acute intra-abdominal or intrapelvic abnormality. 2. Sigmoid colon diverticular disease without acute inflammatory change. 3. Nonvisualized gallbladder presumably due to prior cholecystectomy. Prominent extrahepatic bile duct which may be secondary to postsurgical change though suggest correlation with LFTs. Electronically Signed   By: Donavan Foil M.D.   On: 02/20/2019 17:23   DG Chest Port 1 View  Result Date: 02/20/2019 CLINICAL DATA:  Chest pressure EXAM: PORTABLE CHEST 1 VIEW COMPARISON:  February 12, 2019 FINDINGS: Lungs are clear. Heart size and pulmonary vascularity are normal. No adenopathy. No pneumothorax. No bone lesions. IMPRESSION: Lungs clear.  Cardiac silhouette within normal limits. Electronically Signed   By: Lowella Grip III M.D.   On: 02/20/2019 12:16    ASSESSMENT / PLAN:  Hemorrhagic shock + Septic Shock -Currently off of vasopressors -Status post multiple blood transfusions -Couple of bouts of melena today again -Discussed case case with ID -Have added GI stool panel -Continue antibiotics -Clinically improved today -Reviewed case and care plan with husband in person and later in the day again with son and husband via telephone conference   Acute Blood loss anemia in setting of GI Bleed Thrombocytosis -Monitor for S/Sx of bleeding -Trend CBC (H&H q6h) -SCD's for VTE Prophylaxis (no chemical prophylaxis given bleed) -Transfuse for Hgb <8 -Protonix gtt -GI following, appreciate input ~ plan for EGD when hemodynamically stable -If exhibits overt bleeding, will need tagged RBC scan to localize source of bleeding  AKI Anion gap Acidosis in setting of Lactic Acidosis -Monitor I&O's / urinary output -Follow BMP -Ensure adequate renal perfusion -Avoid nephrotoxic agents as able -Replace electrolytes as indicated -IV Fluids -Trend lactic acid  Right ankle fracture s/p recent ORIF -Ortho consult to remove cast to evaluate surgical site   DISPOSITION: ICU GOALS OF CARE: Full Code VTE PROPHYLAXIS: SCD'S UPDATES: Updated pt at bedside 02/20/2019  Critical care provider statement:    Critical care time (minutes):  109   Critical care time was exclusive of:  Separately billable procedures and  treating other patients   Critical care was necessary to treat or prevent imminent or   life-threatening deterioration of the following conditions:   Acute blood loss anemia, hemorrhagic shock, septic shock, multiple comorbid conditions   Critical care was time spent personally by me on the following  activities:  Development of treatment plan with patient or surrogate,  discussions with consultants, evaluation of patient's response to  treatment, examination of patient, obtaining history from patient or  surrogate, ordering and performing treatments and interventions, ordering  and review of laboratory studies and re-evaluation of patient's condition   I assumed direction of critical care for this patient from another  provider in my specialty: no      Ottie Glazier, M.D.  [facility] [place]

## 2019-02-21 NOTE — Progress Notes (Signed)
ID Husband at bed side Pt has had lower back pain since Jan 3 rd when she abruptly  developed it after waht she thinks as twisting her back. She took aleve for 2 days then On 02/06/19 she fell on her way to the chiropractor and broke her ankle. She was admitted to hospital between 1/7-1/14  And had ORIF  Rt ankle on 1/8  Admitted on 1/20 with chest pain Found to be in shock with severe anemia, thrombocytosis and leucoytosis  Pt says she feels a little better Has very dry mouth Weak   BP 109/72   Pulse 96   Temp 98 F (36.7 C)   Resp (!) 26   SpO2 96%    Awake , alert, pale Chest bl air entry HS- Tachycardia Abd soft B/l knee scar Rt leg cast removed Has a superficial deroofed blister in the front Surgical site on the back- has staples- but no erythema      CBC Latest Ref Rng & Units 02/21/2019 02/21/2019 02/21/2019  WBC 4.0 - 10.5 K/uL - - 35.2(H)  Hemoglobin 12.0 - 15.0 g/dL 6.8(L) 8.3(L) 8.5(L)  Hematocrit 36.0 - 46.0 % 21.0(L) 25.3(L) 25.6(L)  Platelets 150 - 400 K/uL - - 784(H)    CMP Latest Ref Rng & Units 02/21/2019 02/21/2019 02/20/2019  Glucose 70 - 99 mg/dL 137(H) 139(H) 135(H)  BUN 8 - 23 mg/dL 98(H) 96(H) 99(H)  Creatinine 0.44 - 1.00 mg/dL 1.88(H) 1.71(H) 1.92(H)  Sodium 135 - 145 mmol/L 141 137 136  Potassium 3.5 - 5.1 mmol/L 3.2(L) 3.4(L) 3.4(L)  Chloride 98 - 111 mmol/L 109 104 101  CO2 22 - 32 mmol/L 18(L) 22 17(L)  Calcium 8.9 - 10.3 mg/dL 8.9 8.8(L) 9.4  Total Protein 6.5 - 8.1 g/dL - 5.1(L) 6.1(L)  Total Bilirubin 0.3 - 1.2 mg/dL - 0.7 0.8  Alkaline Phos 38 - 126 U/L - 89 107  AST 15 - 41 U/L - 22 26  ALT 0 - 44 U/L - 18 18    Micro BC  02/20/19- MSSA 3/4    Impression/recommendation  Septic shock + acute blood loss shock  MSSA bacteremia: source  ? Leg ? Back R/o infection of the ORIF Need wound culture Need CT leg Need CT lumbar spine Need 2 d Echo  Change vanco/ceftriaxone/flagyl to cefazolin  Severe acute anemia- has GI bleed-  melena - has received multiple PRBC  Leukemoid reaction - infection + ??? Stress response to GI bleed R/o any abscess/ collection rt leg/back  Thrombocytosis > 1000- improving  Rt ankle fracture -- s/p ORIF - leg seen with Dr.Menz- de roofed hemorrhagic blister- Surgical site has no erythema or discharge Will get a wound culture and CT scan  AKI  Pt followed by GI/Intensivist, hospitalist and ortho  Discussed the management with the care team and patient and husband

## 2019-02-21 NOTE — Progress Notes (Signed)
PHARMACY - PHYSICIAN COMMUNICATION CRITICAL VALUE ALERT - BLOOD CULTURE IDENTIFICATION (BCID)  Jasmine Morrow is an 74 y.o. female who presented to [facility] on 02/20/2019 with a chief complaint of chest pain  Assessment:  Lab reports 1 of 4 bottles w/ GPC  Name of physician (or Provider) ContactedTana Conch, NP  Current antibiotics: Vancomycin, Cefepime, Flagyl  Changes to prescribed antibiotics recommended:  Patient is on recommended antibiotics - No changes needed  No results found for this or any previous visit.  Hart Robinsons A 02/21/2019  4:18 AM

## 2019-02-21 NOTE — Consult Note (Signed)
Pharmacy Antibiotic Note  Jasmine Morrow is a 74 y.o. female admitted on 02/20/2019 with sepsis of unknown source. Blood cultures with MSSA. Plan to evaluate surgical site (s/p right ankle fracture with ORIF). Pharmacy has been consulted cefazolin dosing.   Plan: Cefazolin 2 g IV q8h     Temp (24hrs), Avg:97.1 F (36.2 C), Min:96.9 F (36.1 C), Max:97.7 F (36.5 C)  Recent Labs  Lab 02/20/19 0955 02/20/19 1159 02/20/19 1609 02/21/19 0138 02/21/19 0848  WBC 48.0*  --   --  34.9* 35.2*  CREATININE 1.92*  --   --  1.71* 1.88*  LATICACIDVEN  --  2.4* 5.1* 1.1  --   VANCORANDOM  --   --   --  29  --     Estimated Creatinine Clearance: 40 mL/min (A) (by C-G formula based on SCr of 1.88 mg/dL (H)).    Allergies  Allergen Reactions  . Celecoxib Nausea Only  . Hydrocodone-Acetaminophen Nausea And Vomiting  . Meloxicam Nausea Only  . Morphine Sulfate Er Beads Itching  . Naproxen Swelling  . Rofecoxib Nausea Only  . Sulfa Antibiotics Other (See Comments)    Reaction: unknown  . Ciprofloxacin Rash  . Nickel Rash  . Tramadol-Acetaminophen Nausea Only and Rash    Antimicrobials this admission: Vancomycin 1/20 >> 1/21 Cefepime 1/20 >> 1/21 Cefazolin 1/21 >>   Microbiology results: 1/20 BCx: MSSA 1/20 UCx: pending   Thank you for allowing pharmacy to be a part of this patient's care.  Tawnya Crook, PharmD 02/21/2019 12:51 PM

## 2019-02-22 ENCOUNTER — Encounter: Payer: Self-pay | Admitting: Certified Registered Nurse Anesthetist

## 2019-02-22 ENCOUNTER — Inpatient Hospital Stay: Payer: Medicare Other

## 2019-02-22 ENCOUNTER — Encounter: Payer: Self-pay | Admitting: Internal Medicine

## 2019-02-22 ENCOUNTER — Encounter
Admission: EM | Disposition: A | Discharge status: Skilled Nursing Facility | Payer: Self-pay | Source: Home / Self Care | Attending: Family Medicine

## 2019-02-22 ENCOUNTER — Inpatient Hospital Stay
Admit: 2019-02-22 | Discharge: 2019-02-22 | Disposition: A | Discharge status: Home / Self Care | Payer: Medicare Other | Attending: Pulmonary Disease | Admitting: Pulmonary Disease

## 2019-02-22 DIAGNOSIS — R7881 Bacteremia: Secondary | ICD-10-CM

## 2019-02-22 DIAGNOSIS — K921 Melena: Secondary | ICD-10-CM

## 2019-02-22 DIAGNOSIS — R5383 Other fatigue: Secondary | ICD-10-CM

## 2019-02-22 DIAGNOSIS — B9562 Methicillin resistant Staphylococcus aureus infection as the cause of diseases classified elsewhere: Secondary | ICD-10-CM

## 2019-02-22 DIAGNOSIS — Z933 Colostomy status: Secondary | ICD-10-CM

## 2019-02-22 HISTORY — PX: ESOPHAGOGASTRODUODENOSCOPY: SHX5428

## 2019-02-22 HISTORY — PX: FLEXIBLE SIGMOIDOSCOPY: SHX5431

## 2019-02-22 LAB — HEMOGLOBIN AND HEMATOCRIT, BLOOD
HCT: 24.7 % — ABNORMAL LOW (ref 36.0–46.0)
HCT: 22.8 % — ABNORMAL LOW (ref 36.0–46.0)
HCT: 21.9 % — ABNORMAL LOW (ref 36.0–46.0)
Hemoglobin: 8.1 g/dL — ABNORMAL LOW (ref 12.0–15.0)
Hemoglobin: 7.4 g/dL — ABNORMAL LOW (ref 12.0–15.0)
Hemoglobin: 6.9 g/dL — ABNORMAL LOW (ref 12.0–15.0)

## 2019-02-22 LAB — BLOOD GAS, ARTERIAL
Acid-base deficit: 5.1 mmol/L — ABNORMAL HIGH (ref 0.0–2.0)
Bicarbonate: 21.2 mmol/L (ref 20.0–28.0)
FIO2: 0.28
MECHVT: 500 mL
O2 Saturation: 96.5 %
PEEP: 5 cmH2O
Patient temperature: 37
RATE: 15 resp/min
pCO2 arterial: 43 mmHg (ref 32.0–48.0)
pH, Arterial: 7.3 — ABNORMAL LOW (ref 7.350–7.450)
pO2, Arterial: 94 mmHg (ref 83.0–108.0)

## 2019-02-22 LAB — BASIC METABOLIC PANEL
Anion gap: 10 (ref 5–15)
BUN: 90 mg/dL — ABNORMAL HIGH (ref 8–23)
CO2: 20 mmol/L — ABNORMAL LOW (ref 22–32)
Calcium: 8.3 mg/dL — ABNORMAL LOW (ref 8.9–10.3)
Chloride: 112 mmol/L — ABNORMAL HIGH (ref 98–111)
Creatinine, Ser: 1.73 mg/dL — ABNORMAL HIGH (ref 0.44–1.00)
GFR calc Af Amer: 34 mL/min — ABNORMAL LOW (ref >60)
GFR calc non Af Amer: 29 mL/min — ABNORMAL LOW (ref >60)
Glucose, Bld: 150 mg/dL — ABNORMAL HIGH (ref 70–99)
Potassium: 3.2 mmol/L — ABNORMAL LOW (ref 3.5–5.1)
Sodium: 142 mmol/L (ref 135–145)

## 2019-02-22 LAB — CBC WITH DIFFERENTIAL/PLATELET
Abs Immature Granulocytes: 0.6 10*3/uL — ABNORMAL HIGH (ref 0.00–0.07)
Basophils Absolute: 0 10*3/uL (ref 0.0–0.1)
Basophils Relative: 0 %
Eosinophils Absolute: 0 10*3/uL (ref 0.0–0.5)
Eosinophils Relative: 0 %
HCT: 22.2 % — ABNORMAL LOW (ref 36.0–46.0)
Hemoglobin: 7.2 g/dL — ABNORMAL LOW (ref 12.0–15.0)
Immature Granulocytes: 2 %
Lymphocytes Relative: 5 %
Lymphs Abs: 1.3 10*3/uL (ref 0.7–4.0)
MCH: 29.1 pg (ref 26.0–34.0)
MCHC: 32.4 g/dL (ref 30.0–36.0)
MCV: 89.9 fL (ref 80.0–100.0)
Monocytes Absolute: 1.6 10*3/uL — ABNORMAL HIGH (ref 0.1–1.0)
Monocytes Relative: 6 %
Neutro Abs: 22.3 10*3/uL — ABNORMAL HIGH (ref 1.7–7.7)
Neutrophils Relative %: 87 %
Platelets: 595 10*3/uL — ABNORMAL HIGH (ref 150–400)
RBC: 2.47 MIL/uL — ABNORMAL LOW (ref 3.87–5.11)
RDW: 17.8 % — ABNORMAL HIGH (ref 11.5–15.5)
Smear Review: NORMAL
WBC: 25.9 10*3/uL — ABNORMAL HIGH (ref 4.0–10.5)
nRBC: 5.2 % — ABNORMAL HIGH (ref 0.0–0.2)

## 2019-02-22 LAB — PROCALCITONIN: Procalcitonin: 1.09 ng/mL

## 2019-02-22 LAB — ANCA TITERS
Atypical P-ANCA titer: <1:20 {titer}
C-ANCA: <1:20 {titer}
P-ANCA: <1:20 {titer}

## 2019-02-22 LAB — TRIGLYCERIDES: Triglycerides: 228 mg/dL — ABNORMAL HIGH (ref <150)

## 2019-02-22 LAB — ECHOCARDIOGRAM COMPLETE

## 2019-02-22 LAB — URINE CULTURE: Culture: <10000 — AB

## 2019-02-22 LAB — HAPTOGLOBIN: Haptoglobin: 268 mg/dL (ref 42–346)

## 2019-02-22 SURGERY — EGD (ESOPHAGOGASTRODUODENOSCOPY)
Anesthesia: General

## 2019-02-22 SURGERY — SIGMOIDOSCOPY, FLEXIBLE
Anesthesia: Monitor Anesthesia Care

## 2019-02-22 MED ORDER — HALOPERIDOL LACTATE 5 MG/ML IJ SOLN
5.0000 mg | Freq: Once | INTRAMUSCULAR | Status: AC
  Administered 2019-02-22: 5 mg via INTRAVENOUS

## 2019-02-22 MED ORDER — FENTANYL CITRATE (PF) 100 MCG/2ML IJ SOLN
150.0000 ug | Freq: Once | INTRAMUSCULAR | Status: AC
  Administered 2019-02-22: 100 ug via INTRAVENOUS
  Filled 2019-02-22: qty 4

## 2019-02-22 MED ORDER — MORPHINE SULFATE (PF) 2 MG/ML IV SOLN
1.0000 mg | Freq: Once | INTRAVENOUS | Status: AC
  Administered 2019-02-22: 15:00:00 1 mg via INTRAVENOUS

## 2019-02-22 MED ORDER — MIDAZOLAM HCL 2 MG/2ML IJ SOLN
4.0000 mg | Freq: Once | INTRAMUSCULAR | Status: AC
  Administered 2019-02-22: 4 mg via INTRAVENOUS
  Filled 2019-02-22: qty 4

## 2019-02-22 MED ORDER — PROPOFOL 1000 MG/100ML IV EMUL
5.0000 ug/kg/min | INTRAVENOUS | Status: DC
  Administered 2019-02-22: 40 ug/kg/min via INTRAVENOUS
  Administered 2019-02-23: 35 ug/kg/min via INTRAVENOUS
  Administered 2019-02-23: 04:00:00 25 ug/kg/min via INTRAVENOUS
  Filled 2019-02-22 (×2): qty 100

## 2019-02-22 MED ORDER — SUCCINYLCHOLINE CHLORIDE 20 MG/ML IJ SOLN
100.0000 mg | Freq: Once | INTRAMUSCULAR | Status: AC
  Administered 2019-02-22: 17:00:00 50 mg via INTRAVENOUS
  Filled 2019-02-22: qty 1

## 2019-02-22 MED ORDER — FENTANYL CITRATE (PF) 100 MCG/2ML IJ SOLN
12.5000 ug | Freq: Once | INTRAMUSCULAR | Status: AC
  Administered 2019-02-22: 12.5 ug via INTRAVENOUS

## 2019-02-22 MED ORDER — SODIUM CHLORIDE 0.9% IV SOLUTION: Freq: Once | INTRAVENOUS | Status: AC

## 2019-02-22 MED ORDER — MORPHINE SULFATE (PF) 2 MG/ML IV SOLN
INTRAVENOUS | Status: AC
  Filled 2019-02-22: qty 1

## 2019-02-22 MED ORDER — PROPOFOL 1000 MG/100ML IV EMUL
INTRAVENOUS | Status: AC
  Filled 2019-02-22: qty 100

## 2019-02-22 MED ORDER — FENTANYL 2500MCG IN NS 250ML (10MCG/ML) PREMIX INFUSION
INTRAVENOUS | Status: AC
  Filled 2019-02-22: qty 250

## 2019-02-22 MED ORDER — FENTANYL CITRATE (PF) 100 MCG/2ML IJ SOLN
INTRAMUSCULAR | Status: AC
  Filled 2019-02-22: qty 2

## 2019-02-22 MED ORDER — FENTANYL 2500MCG IN NS 250ML (10MCG/ML) PREMIX INFUSION
0.0000 ug/h | INTRAVENOUS | Status: DC
  Administered 2019-02-22: 350 ug/h via INTRAVENOUS
  Administered 2019-02-22: 400 ug/h via INTRAVENOUS
  Filled 2019-02-22: qty 250

## 2019-02-22 MED ORDER — POTASSIUM CHLORIDE 10 MEQ/100ML IV SOLN
10.0000 meq | INTRAVENOUS | Status: AC
  Administered 2019-02-22 (×2): 10 meq via INTRAVENOUS
  Filled 2019-02-22 (×2): qty 100

## 2019-02-22 MED ORDER — PROPOFOL 1000 MG/100ML IV EMUL
5.0000 ug/kg/min | INTRAVENOUS | Status: DC
  Administered 2019-02-22: 40 ug/kg/min via INTRAVENOUS

## 2019-02-22 MED ORDER — SODIUM CHLORIDE 0.9% IV SOLUTION: Freq: Once | INTRAVENOUS | Status: DC

## 2019-02-22 MED ORDER — PHENYLEPHRINE CONCENTRATED 100MG/250ML (0.4 MG/ML) INFUSION SIMPLE
0.0000 ug/min | INTRAVENOUS | Status: DC
  Administered 2019-02-22 (×2): 30 ug/min via INTRAVENOUS
  Administered 2019-02-23: 40 ug/min via INTRAVENOUS
  Filled 2019-02-22 (×3): qty 250

## 2019-02-22 MED ORDER — TECHNETIUM TC 99M-LABELED RED BLOOD CELLS IV KIT
20.0000 | PACK | Freq: Once | INTRAVENOUS | Status: AC | PRN
  Administered 2019-02-22: 13:00:00 19.388 via INTRAVENOUS
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
## 2019-02-22 NOTE — Progress Notes (Signed)
*  PRELIMINARY RESULTS* Echocardiogram 2D Echocardiogram has been performed.  Jasmine Morrow 02/22/2019, 9:32 AM

## 2019-02-22 NOTE — Progress Notes (Signed)
ID  PT continues to have reddish bloody stool from rectal foley Fatigued Was apparently confused this morning as per her nurse  BP (!) 119/49   Pulse (!) 105   Temp (!) 97.2 F (36.2 C) (Oral)   Resp 17   SpO2 100%     O/E  Lethargic Oriented in person, place, year, month  Responds appropriately Mouth dry Chest b/l air entry Hs1s2 tachycardia abd soft Rt leg -   CBC Latest Ref Rng & Units 02/22/2019 02/22/2019 02/22/2019  WBC 4.0 - 10.5 K/uL - - 25.9(H)  Hemoglobin 12.0 - 15.0 g/dL 7.4(L) 8.1(L) 7.2(L)  Hematocrit 36.0 - 46.0 % 22.8(L) 24.7(L) 22.2(L)  Platelets 150 - 400 K/uL - - 595(H)    CMP Latest Ref Rng & Units 02/22/2019 02/21/2019 02/21/2019  Glucose 70 - 99 mg/dL 150(H) 137(H) 139(H)  BUN 8 - 23 mg/dL 90(H) 98(H) 96(H)  Creatinine 0.44 - 1.00 mg/dL 1.73(H) 1.88(H) 1.71(H)  Sodium 135 - 145 mmol/L 142 141 137  Potassium 3.5 - 5.1 mmol/L 3.2(L) 3.2(L) 3.4(L)  Chloride 98 - 111 mmol/L 112(H) 109 104  CO2 22 - 32 mmol/L 20(L) 18(L) 22  Calcium 8.9 - 10.3 mg/dL 8.3(L) 8.9 8.8(L)  Total Protein 6.5 - 8.1 g/dL - - 5.1(L)  Total Bilirubin 0.3 - 1.2 mg/dL - - 0.7  Alkaline Phos 38 - 126 U/L - - 89  AST 15 - 41 U/L - - 22  ALT 0 - 44 U/L - - 18    Vertebrae: No evidence of acute fracture or pars defect. No evidence osteomyelitis. There is multilevel spondylosis with endplate osteophytes and facet hypertrophy.  Paraspinal and other soft tissues: No paraspinal inflammatory changes or fluid collections. Diffuse aortic and branch vessel atherosclerosis. Exophytic 4 cm cyst from the lower pole of the right kidney. There are additional low-density retroperitoneal masses on the right, lateral to the psoas muscle, measuring up to 3.2 cm on image 111/6. 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  CT ankle Negative for evidence of osteomyelitis.  No abscess is identified   Micro- MSSA 4/4 on 02/20/19 02/22/19-  repeat BC sent  Impression/recommendation  Septic shock + acute blood loss shock  MSSA bacteremia: ?source    R/o infection of the ORIF  CT leg/ CT lumbar spine no evidence of infection 2 d Echo- no obvious vegetation- will need TEE when stable Continue cefazolin  Severe acute anemia- has severe GI bleed- has received multiple PRBC- Nuclear scan has no obvious source identified Gi following  Leukemoid reaction - infection +  GI bleed improving  Thrombocytosis-- improving  Rt ankle fracture -- s/p ORIF - leg seen with Dr.Menz- de roofed hemorrhagic blister- Surgical site has no erythema or discharge Will get a wound culture   AKI  Pt followed by GI/Intensivist, hospitalist and ortho  Discussed the management with the care team  [facility] on call remotely this weekend- call if needed

## 2019-02-22 NOTE — Progress Notes (Signed)
[place] for Electrolyte Monitoring and Replacement   Recent Labs: Potassium (mmol/L)  Date Value  02/22/2019 3.2 (L)   Calcium (mg/dL)  Date Value  02/22/2019 8.3 (L)   Albumin (g/dL)  Date Value  02/21/2019 2.0 (L)   Sodium (mmol/L)  Date Value  02/22/2019 142     Assessment: 74 year old female admitted on 02/20/2019 with sepsis of unknown source. Blood cultures with MSSA. Concern for GIB.  Goal of Therapy:  Electrolytes WNL  Plan:  Potassium 10 mEq IV x 2 given earlier today. Will hold off on additional supplementation as patient away at scan. Will follow up electrolytes with morning labs.  Tawnya Crook ,PharmD Clinical Pharmacist 02/22/2019 2:40 PM

## 2019-02-22 NOTE — Progress Notes (Signed)
Given that pt is having continued melena concerning for ongoing bleeding, requiring blood transfusions and vasopressor, will place central line for access.  After using ultrasound to assess bilateral IJ's, both IJ's are severely collapsing. Pt is also confused, and unable to keep her head positioned.   Will obtain emergent line in femoral vein.  Goal is to remove femoral line as soon as feasible.   Darel Hong, AGACNP-BC [place] [facility] Pager: [phone]

## 2019-02-22 NOTE — Op Note (Signed)
[facility] Gastroenterology Patient Name: Jasmine Morrow Procedure Date: 02/22/2019 5:51 PM MRN: [id number] Account #: [id number] Date of Birth: 01/07/1946 Admit Type: Inpatient Age: 74 Room: [facility] ENDO ROOM 4 Gender: Female Note Status: Finalized Procedure:             Upper GI endoscopy Indications:           Acute post hemorrhagic anemia, Hematochezia, Melena Providers:             Benay Pike. Alice Reichert MD, MD Referring MD:          No Local Md, MD (Referring MD) Medicines:             Fentanyl IV and Propofol IV per intensivist. None Complications:         No immediate complications. Estimated blood loss: None. Procedure:             Pre-Anesthesia Assessment:                        - The risks and benefits of the procedure and the                         sedation options and risks were discussed with the                         patient. All questions were answered and informed                         consent was obtained.                        - Patient identification and proposed procedure were                         verified prior to the procedure by the nurse. The                         procedure was verified in the procedure room.                        - ASA Grade Assessment: IV - A patient with severe                         systemic disease that is a constant threat to life.                        - After reviewing the risks and benefits, the patient                         was deemed in satisfactory condition to undergo the                         procedure.                        After obtaining informed consent, the endoscope was                         passed under direct vision. Throughout the procedure,  the patient's blood pressure, pulse, and oxygen                         saturations were monitored continuously. The Endoscope                         was introduced through the mouth, and advanced to the      third part of duodenum. The upper GI endoscopy was                         accomplished without difficulty. The patient tolerated                         the procedure well. Findings:      The esophagus was normal.      Patchy mild inflammation characterized by erosions and erythema was       found in the prepyloric region of the stomach.      Two non-bleeding superficial gastric ulcers with no stigmata of bleeding       were found in the gastric fundus. The largest lesion was 3 mm in largest       dimension.      A 1 cm hiatal hernia was present.      The examined duodenum was normal.      The exam was otherwise without abnormality. Impression:            - Normal esophagus.                        - Gastritis.                        - Non-bleeding gastric ulcers with no stigmata of                         bleeding.                        - 1 cm hiatal hernia.                        - Normal examined duodenum.                        - The examination was otherwise normal.                        - No specimens collected. Moderate Sedation:      See the other procedure note for documentation of moderate sedation with       intraservice time. Recommendation:        - Proceed with flexible sigmoidoscopy Procedure Code(s):     --- Professional ---                        [phone], RT, Esophagogastroduodenoscopy, flexible,                         transoral; diagnostic, including collection of                         specimen(s) by brushing or washing, when performed                         (  separate procedure) Diagnosis Code(s):     --- Professional ---                        K92.1, Melena (includes Hematochezia)                        D62, Acute posthemorrhagic anemia                        K44.9, Diaphragmatic hernia without obstruction or                         gangrene                        K25.9, Gastric ulcer, unspecified as acute or chronic,                         without  hemorrhage or perforation                        K29.70, Gastritis, unspecified, without bleeding CPT copyright 2019 American Medical Association. All rights reserved. The codes documented in this report are preliminary and upon coder review may  be revised to meet current compliance requirements. Efrain Sella MD, MD 02/22/2019 6:23:09 PM This report has been signed electronically. Number of Addenda: 0 Note Initiated On: 02/22/2019 5:51 PM Estimated Blood Loss:  Estimated blood loss: none.      [facility]

## 2019-02-22 NOTE — Procedures (Signed)
Endotracheal Intubation: Patient required placement of an artificial airway secondary to Respiratory Failure  Consent: from son Gelena Klosinski , husband present and wanted son to make decision  Hand washing performed prior to starting the procedure.   Medications administered for sedation prior to procedure:  Midazolam 2 mg IV,  Succynilcholine 50 IIV, Fentanyl 100 mcg IV.    A time out procedure was called and correct patient, name, & ID confirmed. Needed supplies and equipment were assembled and checked to include ETT, 10 ml syringe, Glidescope, Mac and Miller blades, suction, oxygen and bag mask valve, end tidal CO2 monitor.   Patient was positioned to align the mouth and pharynx to facilitate visualization of the glottis.   Heart rate, SpO2 and blood pressure was continuously monitored during the procedure. Pre-oxygenation was conducted prior to intubation and endotracheal tube was placed through the vocal cords into the trachea.     The artificial airway was placed under direct visualization via glidescope route using a 23 ETT on the first attempt.  ETT was secured at 23 cm mark.  Placement was confirmed by auscuitation of lungs with good breath sounds bilaterally and no stomach sounds.  Condensation was noted on endotracheal tube.   Pulse ox 98%.  CO2 detector in place with appropriate color change.   Complications: None .    Chest radiograph ordered and pending.   Comments: OGT placed via glidescope.   Ottie Glazier, M.D.  [facility] [place]

## 2019-02-22 NOTE — Progress Notes (Signed)
Pt continues to have episodes of melena and requiring blood transfusions along with Neosynephrine to maintain MAP >65.  Will obtain tagged RBC scan.     Darel Hong, AGACNP-BC [place] [facility] Pager: [phone]

## 2019-02-22 NOTE — Progress Notes (Signed)
Patient not in ICU Gone to nuclear medicine for tagged RBC scan as she had further rectal bleeding and hypotension. Had to restart pressors.   I went down to imaging and initial images do not show a bleeding source. BUN/Cr ratio has been stable and not disproportionately elevated .  Unable to examine  I have discussed with Dr Lanney Gins and Dr Alice Reichert who is on call for the weekend.   She would need an EGD , was not done yesterday as she was hypotensive. She may be intubated later today and we can see what returns on the NG tube and depending on hemodynamic stability will need to determine timing of EGD , if bilious makes upper GI bleed less likely although not completely and once intubated would be easier to perform EGD.   She is going to go to CT scan after nuclear imaging .  Dr Jonathon Bellows MD,MRCP [facility]) Gastroenterology/Hepatology Pager: [phone]

## 2019-02-22 NOTE — Op Note (Signed)
[facility] Gastroenterology Patient Name: Jasmine Morrow Procedure Date: 02/22/2019 5:49 PM MRN: [id number] Account #: [id number] Date of Birth: 11-06-1945 Admit Type: Inpatient Age: 74 Room: [facility] ENDO ROOM 4 Gender: Female Note Status: Finalized Procedure:             Colonoscopy Indications:           Hematochezia, Acute post hemorrhagic anemia Providers:             Benay Pike. Alice Reichert MD, MD Referring MD:          No Local Md, MD (Referring MD) Medicines:             Propofol drip per Intensivist Complications:         No immediate complications., Hemorrhage Procedure:             Pre-Anesthesia Assessment:                        - The risks and benefits of the procedure and the                         sedation options and risks were discussed with the                         patient. All questions were answered and informed                         consent was obtained.                        - Patient identification and proposed procedure were                         verified prior to the procedure by the nurse. The                         procedure was verified in the procedure room.                        - ASA Grade Assessment: IV - A patient with severe                         systemic disease that is a constant threat to life.                        - After reviewing the risks and benefits, the patient                         was deemed in satisfactory condition to undergo the                         procedure.                        After obtaining informed consent, the colonoscope was                         passed under direct vision. Throughout the procedure,  the patient's blood pressure, pulse, and oxygen                         saturations were monitored continuously. The Endoscope                         was introduced through the anus and advanced to the                         the terminal ileum, with identification of the                          appendiceal orifice and IC valve. After obtaining                         informed consent, the colonoscope was passed under                         direct vision. Throughout the procedure, the patient's                         blood pressure, pulse, and oxygen saturations were                         monitored continuously. The Colonoscope was introduced                         through the and advanced to the the cecum, identified                         by appendiceal orifice and ileocecal valve. The                         colonoscopy was technically difficult and complex due                         to excessive bleeding. Successful completion of the                         procedure was aided by lavage. The patient tolerated                         the procedure well. No bowel preparation was given                         prior to the procedure. The quality of visualization                         was fair. Findings:      The perianal and digital rectal examinations were normal. Pertinent       negatives include normal sphincter tone.      Red blood was found in the entire colon.      Red blood was found in the sigmoid colon.      Multiple small and large-mouthed diverticula were found in the sigmoid       colon. There was no evidence of diverticular bleeding.      A few medium-mouthed diverticula were found in the ascending colon.  There was no evidence of diverticular bleeding.      There is no endoscopic evidence of inflammation, mass, polyps,       ulcerations or angiodysplasia in the entire colon.      Normal mucosa was found in the entire colon.      Retroflexion not performed.      The terminal ileum contained hematin (altered blood/coffee-ground-like       material). Impression:            - Blood in the entire examined colon.                        - Blood in the sigmoid colon.                        - Moderate diverticulosis in the sigmoid  colon. There                         was no evidence of diverticular bleeding.                        - Mild diverticulosis in the ascending colon. There                         was no evidence of diverticular bleeding.                        - Normal mucosa in the entire examined colon.                        - No specimens collected.                        - This is a presumed diverticular bleed. No other                         etiologies are identified. Recommendation:        - Continue close observation.                        Continue all current efforts as per intensivist.                        If rapid bleeding recurs, would consider CTA if                         possible vs. repeat trbc scan, as I am unsure if this                         is a left or right sided diverticular bleed. Procedure Code(s):     --- Professional ---                        [phone], Colonoscopy, flexible; diagnostic, including                         collection of specimen(s) by brushing or washing, when                         performed (separate procedure) Diagnosis Code(s):     ---  Professional ---                        K57.30, Diverticulosis of large intestine without                         perforation or abscess without bleeding                        D62, Acute posthemorrhagic anemia                        K92.1, Melena (includes Hematochezia)                        K92.2, Gastrointestinal hemorrhage, unspecified CPT copyright 2019 American Medical Association. All rights reserved. The codes documented in this report are preliminary and upon coder review may  be revised to meet current compliance requirements. Efrain Sella MD, MD 02/22/2019 7:22:08 PM This report has been signed electronically. Number of Addenda: 0 Note Initiated On: 02/22/2019 5:49 PM Estimated Blood Loss:  Estimated blood loss was minimal. Estimated blood loss                         was minimal.      [facility]

## 2019-02-22 NOTE — Procedures (Signed)
Central Venous Catheter Insertion Procedure Note KAYANN ROETHLISBERGER [id number] [birth year]  Procedure: Insertion of Central Venous Catheter Indications: Assessment of intravascular volume, Drug and/or fluid administration and Frequent blood sampling  Procedure Details Consent: Unable to obtain consent because of emergent medical necessity. Time Out: Verified patient identification, verified procedure, site/side was marked, verified correct patient position, special equipment/implants available, medications/allergies/relevent history reviewed, required imaging and test results available.  Performed  Maximum sterile technique was used including antiseptics, cap, gloves, gown, hand hygiene, mask and sheet. Skin prep: Chlorhexidine; local anesthetic administered A antimicrobial bonded/coated triple lumen catheter was placed in the right femoral vein due to emergent situation using the Seldinger technique.  Evaluation Blood flow good Complications: No apparent complications Patient did tolerate procedure well. Chest X-ray ordered to verify placement.  CXR: Not needed, placed in right femoral vein.   Procedure was performed using Ultrasound for direct visualization of cannulization of right femoral vein.  Line was secured at the 20 cm mark.    Darel Hong, AGACNP-BC [place] [facility] Pager: [phone]  Bradly Bienenstock 02/22/2019, 6:24 AM

## 2019-02-22 NOTE — Consult Note (Signed)
Pharmacy Antibiotic Note  Jasmine Morrow is a 74 y.o. female admitted on 02/20/2019 with sepsis of unknown source. Blood cultures with MSSA. Plan to evaluate surgical site (s/p right ankle fracture with ORIF). Pharmacy has been consulted for cefazolin dosing.   Plan: Cefazolin 2 g IV q8h     Temp (24hrs), Avg:97.7 F (36.5 C), Min:97.2 F (36.2 C), Max:98.3 F (36.8 C)  Recent Labs  Lab 02/20/19 0955 02/20/19 1159 02/20/19 1609 02/21/19 0138 02/21/19 0848 02/22/19 0100  WBC 48.0*  --   --  34.9* 35.2* 25.9*  CREATININE 1.92*  --   --  1.71* 1.88* 1.73*  LATICACIDVEN  --  2.4* 5.1* 1.1  --   --   VANCORANDOM  --   --   --  29  --   --     CrCl cannot be calculated (Unknown ideal weight.).    Allergies  Allergen Reactions  . Celecoxib Nausea Only  . Hydrocodone-Acetaminophen Nausea And Vomiting  . Meloxicam Nausea Only  . Morphine Sulfate Er Beads Itching  . Naproxen Swelling  . Rofecoxib Nausea Only  . Sulfa Antibiotics Other (See Comments)    Reaction: unknown  . Ciprofloxacin Rash  . Nickel Rash  . Tramadol-Acetaminophen Nausea Only and Rash    Antimicrobials this admission: Vancomycin 1/20 >> 1/21 Cefepime 1/20 >> 1/21 Cefazolin 1/21 >>   Microbiology results: 1/20 BCx: MSSA 1/20 UCx: insignificant growth  Thank you for allowing pharmacy to be a part of this patient's care.  Tawnya Crook, PharmD 02/22/2019 2:39 PM

## 2019-02-22 NOTE — Progress Notes (Signed)
100 mcg of fentanyl removed from pyxis. 12.5 mcg administered to patient. 87.5 mcg wasted with Laurin Coder, RN.

## 2019-02-22 NOTE — Progress Notes (Signed)
[facility] Gastroenterology Inpatient Progress Note  Subjective: As the GI on-call physician, I was asked by Dr. Rexene Agent to come evaluate this patient with ongoing melena despite negative tagged red blood cell scan.  Chart was reviewed and it appears the patient is a 74 year old female presenting for hypotension with sepsis as well as over blood loss with melena.  Patient appears confused with a husband is at the bedside.  The patient's husband has some basic understanding of what needs to be done but is unwilling to give consent to any procedures without his son Mccayla Kellough) counsel. Apparently patient has been hypotensive and just recently became normotensive off vasopressors.  In an attempt to avoid resuming vasopressors and for the reason of ongoing gastrointestinal bleeding, we are being asked to perform endoscopy today. Records were reviewed from gastroenterologist Dr. Vicente Males who has been seeing the patient since February 19, 2018.  Endoscopy had been delayed due to the patient's hypotension and sepsis diagnosis and understandably considered procedure portending high risk to the patient.  In lieu of endoscopy, earlier today I tagged red blood cell scan was performed which is negative for any active bleeding.  However, the patient continued to bleed.  I spoke to Dr. Vicente Males about the patient's condition earlier this afternoon before results of tagged red blood cell scan were available.  Objective: Vital signs in last 24 hours: Temp:  [97.2 F (36.2 C)-98.3 F (36.8 C)] 97.2 F (36.2 C) (01/22 1029) Pulse Rate:  [80-115] 85 (01/22 1445) Resp:  [12-26] 21 (01/22 1415) BP: (81-155)/(35-82) 110/41 (01/22 1445) SpO2:  [95 %-100 %] 100 % (01/22 1445) Blood pressure (!) 110/41, pulse 85, temperature (!) 97.2 F (36.2 C), temperature source Oral, resp. rate (!) 21, SpO2 100 %.    Intake/Output from previous day: 01/21 0701 - 01/22 0700 In: 2444.6 [I.V.:2144.6; IV Piggyback:300] Out: [id number]  [Urine:2675; Stool:800]  Intake/Output this shift: Total I/O In: 563.5 [Blood:563.5] Out: 360 [Urine:360]   General appearance: Alert, mildly confused.  In no distress Resp: Coarse breath sounds bilaterally Cardio: Slightly tachycardic, no gallop GI: Obese, mildly distended without tenderness.  Bowel sounds are positive Extremities: Significant edema in both legs with obesity.  Evidence of left ankle fracture   Lab Results: Results for orders placed or performed during the hospital encounter of 02/20/19 (from the past 24 hour(s))  Hemoglobin and hematocrit, blood     Status: Abnormal   Collection Time: 02/21/19  7:23 PM  Result Value Ref Range   Hemoglobin 6.8 (L) 12.0 - 15.0 g/dL   HCT 21.0 (L) 36.0 - 46.0 %  Prepare RBC     Status: None   Collection Time: 02/21/19  8:13 PM  Result Value Ref Range   Order Confirmation      ORDER PROCESSED BY BLOOD BANK Performed at [facility], [address] [place], [place] [address]   CULTURE, BLOOD (ROUTINE X 2) w Reflex to ID Panel     Status: None (Preliminary result)   Collection Time: 02/22/19 12:59 AM   Specimen: BLOOD  Result Value Ref Range   Specimen Description BLOOD RIGHT ANTECUBITAL    Special Requests      BOTTLES DRAWN AEROBIC ONLY Blood Culture adequate volume   Culture      NO GROWTH < 12 HOURS Performed at [facility], [address] [place], [place] [address]    Report Status PENDING   CULTURE, BLOOD (ROUTINE X 2) w Reflex to ID Panel  Status: None (Preliminary result)   Collection Time: 02/22/19 12:59 AM   Specimen: BLOOD  Result Value Ref Range   Specimen Description BLOOD BLOOD RIGHT HAND    Special Requests      BOTTLES DRAWN AEROBIC AND ANAEROBIC Blood Culture adequate volume   Culture      NO GROWTH < 12 HOURS Performed at [facility], [address] [place], [place] [address]    Report Status PENDING   Procalcitonin     Status: None   Collection Time: 02/22/19   1:00 AM  Result Value Ref Range   Procalcitonin 1.09 ng/mL  Basic metabolic panel     Status: Abnormal   Collection Time: 02/22/19  1:00 AM  Result Value Ref Range   Sodium 142 135 - 145 mmol/L   Potassium 3.2 (L) 3.5 - 5.1 mmol/L   Chloride 112 (H) 98 - 111 mmol/L   CO2 20 (L) 22 - 32 mmol/L   Glucose, Bld 150 (H) 70 - 99 mg/dL   BUN 90 (H) 8 - 23 mg/dL   Creatinine, Ser 1.73 (H) 0.44 - 1.00 mg/dL   Calcium 8.3 (L) 8.9 - 10.3 mg/dL   GFR calc non Af Amer 29 (L) >60 mL/min   GFR calc Af Amer 34 (L) >60 mL/min   Anion gap 10 5 - 15  CBC with Differential/Platelet     Status: Abnormal   Collection Time: 02/22/19  1:00 AM  Result Value Ref Range   WBC 25.9 (H) 4.0 - 10.5 K/uL   RBC 2.47 (L) 3.87 - 5.11 MIL/uL   Hemoglobin 7.2 (L) 12.0 - 15.0 g/dL   HCT 22.2 (L) 36.0 - 46.0 %   MCV 89.9 80.0 - 100.0 fL   MCH 29.1 26.0 - 34.0 pg   MCHC 32.4 30.0 - 36.0 g/dL   RDW 17.8 (H) 11.5 - 15.5 %   Platelets 595 (H) 150 - 400 K/uL   nRBC 5.2 (H) 0.0 - 0.2 %   Neutrophils Relative % 87 %   Neutro Abs 22.3 (H) 1.7 - 7.7 K/uL   Lymphocytes Relative 5 %   Lymphs Abs 1.3 0.7 - 4.0 K/uL   Monocytes Relative 6 %   Monocytes Absolute 1.6 (H) 0.1 - 1.0 K/uL   Eosinophils Relative 0 %   Eosinophils Absolute 0.0 0.0 - 0.5 K/uL   Basophils Relative 0 %   Basophils Absolute 0.0 0.0 - 0.1 K/uL   WBC Morphology MILD LEFT SHIFT (1-5% METAS, OCC MYELO, OCC BANDS)    Smear Review Normal platelet morphology    Immature Granulocytes 2 %   Abs Immature Granulocytes 0.60 (H) 0.00 - 0.07 K/uL   Polychromasia PRESENT   Prepare fresh frozen plasma     Status: None (Preliminary result)   Collection Time: 02/22/19  4:30 AM  Result Value Ref Range   Unit Number [id number]    Blood Component Type THAWED PLASMA    Unit division 00    Status of Unit ISSUED    Transfusion Status OK TO TRANSFUSE   Prepare RBC     Status: None   Collection Time: 02/22/19  5:00 AM  Result Value Ref Range   Order Confirmation       ORDER PROCESSED BY BLOOD BANK Performed at [facility], [address] [place], [place] [address]   Hemoglobin and hematocrit, blood     Status: Abnormal   Collection Time: 02/22/19  9:17 AM  Result Value Ref Range  Hemoglobin 8.1 (L) 12.0 - 15.0 g/dL   HCT 24.7 (L) 36.0 - 46.0 %  Hemoglobin and hematocrit, blood     Status: Abnormal   Collection Time: 02/22/19 11:22 AM  Result Value Ref Range   Hemoglobin 7.4 (L) 12.0 - 15.0 g/dL   HCT 22.8 (L) 36.0 - 46.0 %     Recent Labs    02/21/19 0138 02/21/19 0138 02/21/19 0848 02/21/19 1353 02/22/19 0100 02/22/19 0917 02/22/19 1122  WBC 34.9*  --  35.2*  --  25.9*  --   --   HGB 7.2*   < > 8.5*   < > 7.2* 8.1* 7.4*  HCT 21.7*   < > 25.6*   < > 22.2* 24.7* 22.8*  PLT 770*  --  784*  --  595*  --   --    < > = values in this interval not displayed.   BMET Recent Labs    02/21/19 0138 02/21/19 0848 02/22/19 0100  NA 137 141 142  K 3.4* 3.2* 3.2*  CL 104 109 112*  CO2 22 18* 20*  GLUCOSE 139* 137* 150*  BUN 96* 98* 90*  CREATININE 1.71* 1.88* 1.73*  CALCIUM 8.8* 8.9 8.3*   LFT Recent Labs    02/21/19 0138  PROT 5.1*  ALBUMIN 2.0*  AST 22  ALT 18  ALKPHOS 89  BILITOT 0.7   PT/INR Recent Labs    02/21/19 0138  LABPROT 17.0*  INR 1.4*   Hepatitis Panel No results for input(s): HEPBSAG, HCVAB, HEPAIGM, HEPBIGM in the last 72 hours. C-Diff Recent Labs    02/20/19 1816  CDIFFTOX NEGATIVE   Recent Labs    02/20/19 1816  CDIFFPCR NEGATIVE     Studies/Results: CT LUMBAR SPINE WO CONTRAST  Result Date: 02/22/2019 CLINICAL DATA:  Low back and right hip pain since recent fall on 02/06/2019. Bacteremia with suspected infection/inflammation. EXAM: CT LUMBAR SPINE WITHOUT CONTRAST TECHNIQUE: Multidetector CT imaging of the lumbar spine was performed without intravenous contrast administration. Multiplanar CT image reconstructions were also generated. COMPARISON:  Abdominopelvic CT 02/20/2019,  chest CT 08/17/2015 and lumbar spine radiographs 02/06/2019. FINDINGS: Segmentation: There are 5 lumbar type vertebral bodies as correlated with previous imaging. Alignment: Stable grade 1 degenerative anterolisthesis at L4-5 and L5-S1. Mild convex right scoliosis. Vertebrae: No evidence of acute fracture or pars defect. No evidence osteomyelitis. There is multilevel spondylosis with endplate osteophytes and facet hypertrophy. Paraspinal and other soft tissues: No paraspinal inflammatory changes or fluid collections. Diffuse aortic and branch vessel atherosclerosis. Exophytic 4 cm cyst from the lower pole of the right kidney. There are additional low-density retroperitoneal masses on the right, lateral to the psoas muscle, measuring up to 3.2 cm on image 111/6. 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 Disc levels: There is mild disc degeneration and endplate osteophyte formation in the thoracic spine extending from T9-10 through T12-L1. No resulting spinal stenosis or nerve root encroachment. L1-2: Loss of disc height with annular disc bulging and endplate osteophytes. No significant spinal stenosis or nerve root encroachment. L2-3: Advanced loss of disc height with vacuum phenomenon, annular disc bulging and endplate osteophytes. Mild facet and ligamentous hypertrophy. These factors contribute to mild spinal stenosis and mild left-greater-than-right foraminal narrowing. L3-4: Relatively preserved disc height with mild disc bulging and endplate osteophyte formation asymmetric to the left. Moderate facet and ligamentous hypertrophy. These factors contribute to mild spinal stenosis with mild asymmetric narrowing of the lateral recess and foramen  on the left. L4-5: Mild loss of disc height with mild annular disc bulging. Advanced bilateral facet hypertrophy, accounting for the grade 1 anterolisthesis. Resulting mild-to-moderate spinal stenosis with narrowing of  both lateral recesses and asymmetric right foraminal narrowing. L5-S1: Advanced bilateral facet hypertrophy accounting for the grade 1 anterolisthesis. Advanced degenerative disc disease with loss of disc height, vacuum phenomenon and endplate osteophytes asymmetric to the right. Moderate foraminal narrowing bilaterally. IMPRESSION: 1. No CT evidence of spinal or paraspinal infection. 2. Multilevel spondylosis, similar to recent abdominal CT. Disc and endplate degeneration are most advanced at the L2-3 and L5-S1 levels. There are advanced facet degenerative changes bilaterally at L4-5 and L5-S1. Resulting spinal and foraminal stenosis as detailed above; the right foraminal narrowing is worst at L4-5. 3. Lobulated low-density retroperitoneal mass on the right has a nonaggressive appearance and may reflect a lymphangioma, although is incompletely evaluated by this noncontrast study. This is lateral to the psoas muscle and unrelated to the spine. Electronically Signed   By: Richardean Sale M.D.   On: 02/22/2019 15:33   NM GI Blood Loss  Result Date: 02/22/2019 CLINICAL DATA:  Bright red blood per rectum. History of black tarry stools. EXAM: NUCLEAR MEDICINE GASTROINTESTINAL BLEEDING SCAN TECHNIQUE: Sequential abdominal images were obtained following intravenous administration of Tc-58m labeled red blood cells. RADIOPHARMACEUTICALS:  19.4 mCi Tc-76m pertechnetate in-vitro labeled red cells. COMPARISON:  CT 02/20/2019. FINDINGS: Following administration radiopharmaceutical standard on static images obtained. No evidence of GI bleed noted. IMPRESSION: No evidence of GI bleed. Electronically Signed   By: Marcello Moores  Register   On: 02/22/2019 15:07   CT ANKLE RIGHT WO CONTRAST  Result Date: 02/22/2019 CLINICAL DATA:  The patient suffered right ankle fractures in a trip and fall 02/06/2019. She underwent fixation 02/08/2019. The patient was then admitted to the hospital 02/20/2019 with chest discomfort and oxygen  desaturation, elevated white blood cell count and possible sepsis. Subsequent encounter. EXAM: CT OF THE RIGHT ANKLE WITHOUT CONTRAST TECHNIQUE: Multidetector CT imaging of the right ankle was performed according to the standard protocol. Multiplanar CT image reconstructions were also generated. COMPARISON:  CT and plain films right ankle 02/06/2019. Plain films right ankle 02/08/2019. FINDINGS: Bones/Joint/Cartilage The patient is status post plate and screw fixation of a posterior malleolar fracture and repair of the syndesmosis with wires and plate along the distal fibula. Hardware is intact without evidence of loosening or infection. No bony destructive change or periosteal reaction is identified. Inferiorly displaced transverse fracture through the base of the medial malleolus is unchanged. There is also a fracture of the anterior corner of the distal tibia on the lateral side where a fracture fragment measuring 1 cm in diameter is laterally rotated and displaced, unchanged. Lateral tilt and posterior subluxation of the talus at the tibiotalar joint have been reduced. Posterior malleolar fracture is in near anatomic position and alignment. There is mild widening of the medial clear space. Tiny subchondral cyst in both the medial and lateral talar dome are again seen. Mild midfoot osteoarthritis is noted. There is a very small plantar calcaneal spur. Ligaments Suboptimally assessed by CT. Muscles and Tendons Intact and normal in appearance. No evidence of tenosynovitis or tendon entrapment. Soft tissues No joint effusion. Mild soft tissue swelling is seen about the ankle. No focal fluid collection or soft tissue gas. IMPRESSION: Negative for evidence of osteomyelitis.  No abscess is identified. Postoperative change as described above. Position and alignment of the patient's fractures are improved. Electronically Signed   By: Marcello Moores  Dalessio M.D.   On: 02/22/2019 13:48   ECHOCARDIOGRAM COMPLETE  Result Date:  02/22/2019   ECHOCARDIOGRAM REPORT   Patient Name:   CINDER CAMBRA Date of Exam: 02/22/2019 Medical Rec #:  [id number]       Height:       68.0 in Accession #:    [id number]      Weight:       305.0 lb Date of Birth:  [birth year]       BSA:          2.45 m Patient Age:    43 years        BP:           123/74 mmHg Patient Gender: F               HR:           139 bpm. Exam Location:  [facility] Procedure: 2D Echo, Color Doppler and Cardiac Doppler Indications:     Bacteremia 790.7  History:         Patient has no prior history of Echocardiogram examinations.                  Risk Factors:Hypertension.  Sonographer:     Sherrie Sport RDCS (AE) Referring Phys:  [id number] Bradly Bienenstock Diagnosing Phys: Yolonda Kida MD  Sonographer Comments: Technically difficult study due to poor echo windows, suboptimal parasternal window, no apical window and no subcostal window. IMPRESSIONS  1. Left ventricular ejection fraction, by visual estimation, is 65 to 70%. The left ventricle has normal function. Normal left ventricular posterior wall thickness. There is no left ventricular hypertrophy.  2. The left ventricle has no regional wall motion abnormalities.  3. Global right ventricle has normal systolic function.The right ventricular size is normal. No increase in right ventricular wall thickness.  4. Left atrial size was normal.  5. Right atrial size was normal.  6. The mitral valve is normal in structure. Trivial mitral valve regurgitation.  7. The tricuspid valve is normal in structure.  8. The tricuspid valve is normal in structure. Tricuspid valve regurgitation is mild.  9. The aortic valve is normal in structure. Aortic valve regurgitation is not visualized. 10. The pulmonic valve was abnormal. Pulmonic valve regurgitation is not visualized. 11. The aortic root was not well visualized. 12. Normal pulmonary artery systolic pressure. FINDINGS  Left Ventricle: Left ventricular ejection fraction, by visual estimation, is 65 to 70%.  The left ventricle has normal function. The left ventricle has no regional wall motion abnormalities. Normal left ventricular posterior wall thickness. There is no left ventricular hypertrophy. Left ventricular diastolic parameters were normal. Right Ventricle: The right ventricular size is normal. No increase in right ventricular wall thickness. Global RV systolic function is has normal systolic function. The tricuspid regurgitant velocity is 2.19 m/s, and with an assumed right atrial pressure  of 10 mmHg, the estimated right ventricular systolic pressure is normal at 29.2 mmHg. Left Atrium: Left atrial size was normal in size. Right Atrium: Right atrial size was normal in size Pericardium: There is no evidence of pericardial effusion. Mitral Valve: The mitral valve is normal in structure. Trivial mitral valve regurgitation. Tricuspid Valve: The tricuspid valve is normal in structure. Tricuspid valve regurgitation is mild. Aortic Valve: The aortic valve is normal in structure. Aortic valve regurgitation is not visualized. Pulmonic Valve: The pulmonic valve was abnormal. Pulmonic valve regurgitation is not visualized. Pulmonic regurgitation is not visualized. Aorta: The aortic root  was not well visualized. IAS/Shunts: No atrial level shunt detected by color flow Doppler.  LEFT VENTRICLE PLAX 2D LVIDd:         3.21 cm LVIDs:         2.05 cm LV PW:         1.38 cm LV IVS:        1.14 cm LVOT diam:     2.00 cm LV SV:         28 ml LV SV Index:   10.46 LVOT Area:     3.14 cm  LEFT ATRIUM         Index LA diam:    3.60 cm 1.47 cm/m   AORTA Ao Root diam: 2.50 cm TRICUSPID VALVE TR Peak grad:   19.2 mmHg TR Vmax:        219.00 cm/s  SHUNTS Systemic Diam: 2.00 cm  Yolonda Kida MD Electronically signed by Yolonda Kida MD Signature Date/Time: 02/22/2019/12:48:45 PM    Final     Scheduled Inpatient Medications:   . sodium chloride   Intravenous Once  . calcium-vitamin D  2 tablet Oral Daily  . Chlorhexidine  Gluconate Cloth  6 each Topical Daily  . fentaNYL (SUBLIMAZE) injection  150 mcg Intravenous Once  . loratadine  10 mg Oral Daily  . midazolam  4 mg Intravenous Once  . [START ON 02/24/2019] pantoprazole  40 mg Intravenous Q12H  . succinylcholine  100 mg Intravenous Once    Continuous Inpatient Infusions:   .  ceFAZolin (ANCEF) IV 2 g (02/22/19 1601)  . lactated ringers    . pantoprozole (PROTONIX) infusion 8 mg/hr (02/22/19 0844)  . phenylephrine (NEO-SYNEPHRINE) Adult infusion Stopped (02/22/19 1035)    PRN Inpatient Medications:  acetaminophen, bisacodyl, hydroxypropyl methylcellulose / hypromellose, ondansetron, traZODone  Miscellaneous: Negative  Assessment:  #1.  Persistent gastrointestinal bleeding with hypotension.  Differential diagnosis includes peptic ulcer disease, now more concerning for malignancy given recent results of abdominal CT scan showing a lobulated mass near the spinal cord thought to be possible benign versus malignant tumor that could not be further characterized without contrast.  Colon cancer primary has been discussed.  2.  Sepsis-on antibiotics empirically.  3.  Sepsis with hypotension.  4.  Confusion related to sepsis.  Plan:  #1.  Proceed with EGD urgently to avoid hemorrhagic shock related to gastrointestinal bleeding. The patient's husband and son (Travis)understand  the nature of the planned procedure, indications, risks, alternatives and potential complications including but not limited to bleeding, infection, perforation, damage to internal organs and possible oversedation/side effects from anesthesia. The patient agrees and gives consent to proceed.   Please refer to procedure notes for findings, recommendations and patient disposition/instructions.  Ahmani Daoud K. Alice Reichert, M.D. 02/22/2019, 5:33 PM

## 2019-02-22 NOTE — Progress Notes (Signed)
Flex sig anticipated, but colonoscopy was performed. Pt was adequately prepped for colonoscopy.

## 2019-02-23 LAB — PREPARE FRESH FROZEN PLASMA: Unit division: 0

## 2019-02-23 LAB — CBC WITH DIFFERENTIAL/PLATELET
Abs Immature Granulocytes: 0.51 10*3/uL — ABNORMAL HIGH (ref 0.00–0.07)
Basophils Absolute: 0 10*3/uL (ref 0.0–0.1)
Basophils Relative: 0 %
Eosinophils Absolute: 0.2 10*3/uL (ref 0.0–0.5)
Eosinophils Relative: 1 %
HCT: 26.6 % — ABNORMAL LOW (ref 36.0–46.0)
Hemoglobin: 8.3 g/dL — ABNORMAL LOW (ref 12.0–15.0)
Immature Granulocytes: 2 %
Lymphocytes Relative: 11 %
Lymphs Abs: 2.2 10*3/uL (ref 0.7–4.0)
MCH: 29 pg (ref 26.0–34.0)
MCHC: 31.2 g/dL (ref 30.0–36.0)
MCV: 93 fL (ref 80.0–100.0)
Monocytes Absolute: 1.8 10*3/uL — ABNORMAL HIGH (ref 0.1–1.0)
Monocytes Relative: 8 %
Neutro Abs: 16.6 10*3/uL — ABNORMAL HIGH (ref 1.7–7.7)
Neutrophils Relative %: 78 %
Platelets: 514 10*3/uL — ABNORMAL HIGH (ref 150–400)
RBC: 2.86 MIL/uL — ABNORMAL LOW (ref 3.87–5.11)
RDW: 18.1 % — ABNORMAL HIGH (ref 11.5–15.5)
Smear Review: NORMAL
WBC: 21.3 10*3/uL — ABNORMAL HIGH (ref 4.0–10.5)
nRBC: 7.6 % — ABNORMAL HIGH (ref 0.0–0.2)

## 2019-02-23 LAB — HEMOGLOBIN AND HEMATOCRIT, BLOOD
HCT: 24.2 % — ABNORMAL LOW (ref 36.0–46.0)
HCT: 25.1 % — ABNORMAL LOW (ref 36.0–46.0)
HCT: 23.1 % — ABNORMAL LOW (ref 36.0–46.0)
HCT: 25.4 % — ABNORMAL LOW (ref 36.0–46.0)
Hemoglobin: 7.7 g/dL — ABNORMAL LOW (ref 12.0–15.0)
Hemoglobin: 7.8 g/dL — ABNORMAL LOW (ref 12.0–15.0)
Hemoglobin: 7.4 g/dL — ABNORMAL LOW (ref 12.0–15.0)
Hemoglobin: 8.1 g/dL — ABNORMAL LOW (ref 12.0–15.0)

## 2019-02-23 LAB — BASIC METABOLIC PANEL
Anion gap: 10 (ref 5–15)
BUN: 58 mg/dL — ABNORMAL HIGH (ref 8–23)
CO2: 21 mmol/L — ABNORMAL LOW (ref 22–32)
Calcium: 8.7 mg/dL — ABNORMAL LOW (ref 8.9–10.3)
Chloride: 116 mmol/L — ABNORMAL HIGH (ref 98–111)
Creatinine, Ser: 1.4 mg/dL — ABNORMAL HIGH (ref 0.44–1.00)
GFR calc Af Amer: 43 mL/min — ABNORMAL LOW (ref >60)
GFR calc non Af Amer: 37 mL/min — ABNORMAL LOW (ref >60)
Glucose, Bld: 89 mg/dL (ref 70–99)
Potassium: 2.8 mmol/L — ABNORMAL LOW (ref 3.5–5.1)
Sodium: 147 mmol/L — ABNORMAL HIGH (ref 135–145)

## 2019-02-23 LAB — CULTURE, BLOOD (ROUTINE X 2): Special Requests: ADEQUATE

## 2019-02-23 LAB — BPAM FFP
Blood Product Expiration Date: 202101272359
ISSUE DATE / TIME: 202101220834
Unit Type and Rh: 5100

## 2019-02-23 LAB — PREPARE RBC (CROSSMATCH)

## 2019-02-23 LAB — GLUCOSE, CAPILLARY: Glucose-Capillary: 98 mg/dL (ref 70–99)

## 2019-02-23 MED ORDER — CHLORHEXIDINE GLUCONATE 0.12 % MT SOLN
15.0000 mL | Freq: Two times a day (BID) | OROMUCOSAL | Status: DC
  Administered 2019-02-23 – 2019-03-07 (×21): 15 mL via OROMUCOSAL
  Filled 2019-02-23 (×20): qty 15

## 2019-02-23 MED ORDER — ORAL CARE MOUTH RINSE
15.0000 mL | Freq: Two times a day (BID) | OROMUCOSAL | Status: DC
  Administered 2019-02-24 – 2019-03-04 (×9): 15 mL via OROMUCOSAL

## 2019-02-23 MED ORDER — LACTATED RINGERS IV SOLN: INTRAVENOUS | Status: DC

## 2019-02-23 MED ORDER — POTASSIUM CHLORIDE 10 MEQ/100ML IV SOLN
10.0000 meq | INTRAVENOUS | Status: AC
  Administered 2019-02-23 (×4): 10 meq via INTRAVENOUS
  Filled 2019-02-23 (×4): qty 100

## 2019-02-23 MED ORDER — FAMOTIDINE IN NACL 20-0.9 MG/50ML-% IV SOLN
20.0000 mg | Freq: Two times a day (BID) | INTRAVENOUS | Status: DC
  Administered 2019-02-23 – 2019-02-25 (×4): 20 mg via INTRAVENOUS
  Filled 2019-02-23 (×4): qty 50

## 2019-02-23 NOTE — Progress Notes (Signed)
Patient is resting comfortably now apparently was intubated overnight and then later extubated. Just discussed her CT findings with her husband regarding the ankle. Her medial malleolus will need to be fixed at some point. That will require her skin to be more healed than it is now and obviously improved medical condition. We have about a month to get that fixed. I am hoping she is going to be more alert and cooperative tomorrow so we can roll her up on her side and get her staples out. Plan on just going with a continued soft dressing until she is going to be mobilized at which point we will want to put a cast on for more stability.

## 2019-02-23 NOTE — Progress Notes (Signed)
Name: Jasmine Morrow MRN: [id number] DOB: 30-Jan-1946    ADMISSION DATE:  02/20/2019 CONSULTATION DATE:  02/20/2019  REFERRING MD :  Dr. Manuella Ghazi  CHIEF COMPLAINT:  Chest pain  BRIEF PATIENT DESCRIPTION:  74 y.o. Female admitted 02/20/19 with Hemorrhagic shock in setting of Acute blood loss anemia due to GI bleed, along with questionable sepsis/septic shock of unknown etiology.  GI and ID following.  SIGNIFICANT EVENTS  1/20- Admission to Stepdown 1/20- GI, ID, PCCM consulted  STUDIES:  1/20- CXR>> Lungs clear.  Cardiac silhouette within normal limits. 1/20- CT Abdomen & Pelvis w/o contrast>> 1. No CT evidence for acute intra-abdominal or intrapelvic abnormality. 2. Sigmoid colon diverticular disease without acute inflammatory change. 3. Nonvisualized gallbladder presumably due to prior cholecystectomy. Prominent extrahepatic bile duct which may be secondary to postsurgical change though suggest correlation with LFTs.  CULTURES: SARS-CoV-2 PCR 1/20>> Negative Influenza PCR 1/20>> Negative Blood cultures x2 1/20>> Urine 1/20>> Stool 1/20>> C. Diff antigen +, C. Diff toxin - (results indeterminate)  ANTIBIOTICS: Cefepime 1/20>> Flagyl 1/20>> Vancomycin 1/20>>  HISTORY OF PRESENT ILLNESS:   Jasmine Morrow is a 74 year old female with a past medical history notable for hypertension and recent right ankle fracture with ORIF who presented to [facility] ED on 02/20/2019 with complaints of chest pain.  EMS was picking her up today for her Orthopedic appointment when she began to complain of chest discomfort, and was noted to be hypoxic with O2 sats ranging from the 80's to 90's.   Upon presentation to the ED her BP was soft with BP 104/57, HR 94, afebrile.  Initial work-up in the ED revealed anemia with hemoglobin of 5.6, WBC 48, platelets 1009, lactic acid 2.4, BNP 86, high-sensitivity troponin 35, BUN 99, creatinine 1.92, bicarb 17, anion gap 18.  Upon exam she was noted to be positive for  melena.  Chest x-ray without evidence of pneumonia, urinalysis without evidence of UTI.  CT abdomen and pelvis negative for any acute intra-abdominal abnormality.  Her COVID-19 PCR is negative, influenza PCR is negative, and C. difficile antigen positive but C. difficile toxin negative (results indeterminate for C. Difficile).  She is admitted to stepdown unit by hospitalist for further work-up and treatment of hemorrhagic shock in the setting of acute blood loss anemia due to GI bleed and questionable septic shock of unknown etiology.  GI and ID are following.  PCCM was consulted for assistance in managing hypotension.  PAST MEDICAL HISTORY :   has a past medical history of Hypertension and Seasonal allergies.  has a past surgical history that includes Replacement total knee (Bilateral, 2008); Cesarean section; Tumor removal; Dilation and curettage of uterus; ORIF ankle fracture (Right, 02/08/2019); and Syndesmosis repair (Right, 02/08/2019). Prior to Admission medications   Medication Sig Start Date End Date Taking? Authorizing Provider  acetaminophen (TYLENOL) 500 MG tablet Take 1,000 mg by mouth every 6 (six) hours as needed for mild pain or headache.    Yes [provider]  albuterol (PROVENTIL HFA;VENTOLIN HFA) 108 (90 Base) MCG/ACT inhaler Inhale 2 puffs into the lungs every 4 (four) hours as needed. Patient taking differently: Inhale 2 puffs into the lungs every 4 (four) hours as needed for wheezing or shortness of breath.  05/09/16  Yes Wilhelmina Mcardle, MD  aspirin EC 325 MG tablet Take 1 tablet (325 mg total) by mouth daily. Patient taking differently: Take 325 mg by mouth 2 (two) times daily.  02/13/19  Yes Duanne Guess, PA-C  azithromycin (ZITHROMAX) 250  MG tablet Take 250 mg by mouth daily. 02/15/19 02/20/19 Yes [provider]  bisacodyl (DULCOLAX) 10 MG suppository Place 1 suppository (10 mg total) rectally daily as needed for moderate constipation. 02/10/19  Yes Duanne Guess, PA-C  Calcium-Vitamin D 600-200 MG-UNIT per tablet Take 2 tablets by mouth daily.    Yes [provider]  carvedilol (COREG) 3.125 MG tablet Take 3.125 mg by mouth 2 (two) times daily. 12/07/18  Yes [provider]  cetirizine (ZYRTEC) 5 MG tablet Take 10 mg by mouth daily.    Yes [provider]  cyclobenzaprine (FLEXERIL) 10 MG tablet Take 10 mg by mouth daily as needed for muscle spasms.   Yes [provider]  fluocinonide cream (LIDEX) [id number] % Apply 1 application topically 3 (three) times daily as needed (mouth sores). Apply to mouth lesions as needed 01/17/19  Yes [provider]  hydroxypropyl methylcellulose / hypromellose (ISOPTO TEARS / GONIOVISC) 2.5 % ophthalmic solution Place 1 drop into both eyes every 8 (eight) hours as needed for dry eyes.    Yes [provider]  Multiple Vitamin (MULTIVITAMIN WITH MINERALS) TABS tablet Take 1 tablet by mouth daily.   Yes [provider]  Multiple Vitamins-Minerals (PRESERVISION AREDS 2 PO) Take 1 tablet by mouth 2 (two) times daily.   Yes [provider]  omeprazole (PRILOSEC) 20 MG capsule Take 20 mg by mouth 2 (two) times daily as needed (Heartburn).  04/23/15  Yes [provider]  ondansetron (ZOFRAN) 8 MG tablet Take 1 tablet (8 mg total) by mouth every 8 (eight) hours as needed for nausea or vomiting. 02/06/19  Yes Sable Feil, PA-C  traMADol (ULTRAM) 50 MG tablet Take 1-2 tablets (50-100 mg total) by mouth every 6 (six) hours as needed for severe pain. 02/10/19  Yes Duanne Guess, PA-C  triamterene-hydrochlorothiazide (MAXZIDE-25) 37.5-25 MG tablet Take 1 tablet by mouth daily. 03/21/18  Yes [provider]   Allergies  Allergen Reactions  . Celecoxib Nausea Only  . Hydrocodone-Acetaminophen Nausea And Vomiting  . Meloxicam Nausea Only  . Morphine Sulfate Er Beads Itching  . Naproxen Swelling  . Rofecoxib Nausea Only  . Sulfa Antibiotics  Other (See Comments)    Reaction: unknown  . Ciprofloxacin Rash  . Nickel Rash  . Tramadol-Acetaminophen Nausea Only and Rash    FAMILY HISTORY:  family history includes Bone cancer in her mother; COPD in her father. SOCIAL HISTORY:  reports that she quit smoking about 28 years ago. Her smoking use included cigarettes. She has a 60.00 pack-year smoking history. She has never used smokeless tobacco. She reports that she does not drink alcohol or use drugs.   COVID-19 DISASTER DECLARATION:  FULL CONTACT PHYSICAL EXAMINATION WAS NOT POSSIBLE DUE TO TREATMENT OF COVID-19 AND  CONSERVATION OF PERSONAL PROTECTIVE EQUIPMENT, LIMITED EXAM FINDINGS INCLUDE-  Patient assessed or the symptoms described in the history of present illness.  In the context of the Global COVID-19 pandemic, which necessitated consideration that the patient might be at risk for infection with the SARS-CoV-2 virus that causes COVID-19, Institutional protocols and algorithms that pertain to the evaluation of patients at risk for COVID-19 are in a state of rapid change based on information released by regulatory bodies including the CDC and federal and state organizations. These policies and algorithms were followed during the patient's care while in hospital.  REVIEW OF SYSTEMS:  Positives in BOLD: Pt currently denies all complaints Constitutional: Negative for fever, chills, weight loss,  malaise/fatigue and diaphoresis.  HENT: Negative for hearing loss, ear pain, nosebleeds, congestion, sore throat, neck pain, tinnitus and ear discharge.   Eyes: Negative for blurred vision, double vision, photophobia, pain, discharge and redness.  Respiratory: Negative for cough, hemoptysis, sputum production, shortness of breath, wheezing and stridor.   Cardiovascular: Negative for chest pain, palpitations, orthopnea, claudication, leg swelling and PND.  Gastrointestinal: Negative for heartburn, nausea, vomiting, abdominal pain, +melena  diarreah abd pain Genitourinary: Negative for dysuria, urgency, frequency, hematuria and flank pain.  Musculoskeletal: Negative for myalgias, back pain, joint pain and falls.  Skin: Negative for itching and rash.  Neurological: Negative for dizziness, tingling, tremors, sensory change, speech change, focal weakness, seizures, loss of consciousness, weakness and headaches.  Endo/Heme/Allergies: Negative for environmental allergies and polydipsia. Does not bruise/bleed easily.  SUBJECTIVE:  Denies chest pain, SOB, cough, dizziness, abdominal pain, N/V, fever/chills Reports that it is cool in the room Having intermittent melena as reported by nursing  VITAL SIGNS: Temp:  [97.8 F (36.6 C)-98.2 F (36.8 C)] 98.2 F (36.8 C) (01/23 0900) Pulse Rate:  [63-115] 103 (01/23 1000) Resp:  [12-21] 14 (01/23 1000) BP: (89-137)/(41-83) 117/65 (01/23 1000) SpO2:  [95 %-100 %] 100 % (01/23 1000) FiO2 (%):  [28 %] 28 % (01/23 0850) Weight:  [131.8 kg] 131.8 kg (01/22 2100)  PHYSICAL EXAMINATION: General:  Acute on chronically ill appearing female, laying in bed, on Nasal cannula, in NAD Neuro:  Awake, A&O x3, follows commands, no focal deficits, speech clear HEENT:  Atraumatic, normocephalic, neck supple, no JVD Cardiovascular:  RRR, s1s2, no M/R/G Lungs:  Clear to auscultation bilaterally, no wheezing or rales, even, nonlabored, normal effort Abdomen:  Obese, soft, nontender, nondistended, no guarding or rebound tenderness, BS hypoactive, rectal tube with melena Musculoskeletal:  No deformites Skin:  Warm and dry.  No obvious rashes, lesions, or ulcerations  Recent Labs  Lab 02/21/19 0848 02/22/19 0100 02/23/19 0540  NA 141 142 147*  K 3.2* 3.2* 2.8*  CL 109 112* 116*  CO2 18* 20* 21*  BUN 98* 90* 58*  CREATININE 1.88* 1.73* 1.40*  GLUCOSE 137* 150* 89   Recent Labs  Lab 02/21/19 0848 02/21/19 1353 02/22/19 0100 02/22/19 0917 02/23/19 0355 02/23/19 0540 02/23/19 0907  HGB 8.5*    < > 7.2*   < > 7.7* 8.3* 7.8*  HCT 25.6*   < > 22.2*   < > 24.2* 26.6* 25.1*  WBC 35.2*  --  25.9*  --   --  21.3*  --   PLT 784*  --  595*  --   --  514*  --    < > = values in this interval not displayed.   CT LUMBAR SPINE WO CONTRAST  Result Date: 02/22/2019 CLINICAL DATA:  Low back and right hip pain since recent fall on 02/06/2019. Bacteremia with suspected infection/inflammation. EXAM: CT LUMBAR SPINE WITHOUT CONTRAST TECHNIQUE: Multidetector CT imaging of the lumbar spine was performed without intravenous contrast administration. Multiplanar CT image reconstructions were also generated. COMPARISON:  Abdominopelvic CT 02/20/2019, chest CT 08/17/2015 and lumbar spine radiographs 02/06/2019. FINDINGS: Segmentation: There are 5 lumbar type vertebral bodies as correlated with previous imaging. Alignment: Stable grade 1 degenerative anterolisthesis at L4-5 and L5-S1. Mild convex right scoliosis. Vertebrae: No evidence of acute fracture or pars defect. No evidence osteomyelitis. There is multilevel spondylosis with endplate osteophytes and facet hypertrophy. Paraspinal and other soft tissues: No paraspinal inflammatory changes or fluid collections. Diffuse aortic and branch vessel atherosclerosis. Exophytic 4  cm cyst from the lower pole of the right kidney. There are additional low-density retroperitoneal masses on the right, lateral to the psoas muscle, measuring up to 3.2 cm on image 111/6. 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 Disc levels: There is mild disc degeneration and endplate osteophyte formation in the thoracic spine extending from T9-10 through T12-L1. No resulting spinal stenosis or nerve root encroachment. L1-2: Loss of disc height with annular disc bulging and endplate osteophytes. No significant spinal stenosis or nerve root encroachment. L2-3: Advanced loss of disc height with vacuum phenomenon, annular disc bulging and endplate  osteophytes. Mild facet and ligamentous hypertrophy. These factors contribute to mild spinal stenosis and mild left-greater-than-right foraminal narrowing. L3-4: Relatively preserved disc height with mild disc bulging and endplate osteophyte formation asymmetric to the left. Moderate facet and ligamentous hypertrophy. These factors contribute to mild spinal stenosis with mild asymmetric narrowing of the lateral recess and foramen on the left. L4-5: Mild loss of disc height with mild annular disc bulging. Advanced bilateral facet hypertrophy, accounting for the grade 1 anterolisthesis. Resulting mild-to-moderate spinal stenosis with narrowing of both lateral recesses and asymmetric right foraminal narrowing. L5-S1: Advanced bilateral facet hypertrophy accounting for the grade 1 anterolisthesis. Advanced degenerative disc disease with loss of disc height, vacuum phenomenon and endplate osteophytes asymmetric to the right. Moderate foraminal narrowing bilaterally. IMPRESSION: 1. No CT evidence of spinal or paraspinal infection. 2. Multilevel spondylosis, similar to recent abdominal CT. Disc and endplate degeneration are most advanced at the L2-3 and L5-S1 levels. There are advanced facet degenerative changes bilaterally at L4-5 and L5-S1. Resulting spinal and foraminal stenosis as detailed above; the right foraminal narrowing is worst at L4-5. 3. Lobulated low-density retroperitoneal mass on the right has a nonaggressive appearance and may reflect a lymphangioma, although is incompletely evaluated by this noncontrast study. This is lateral to the psoas muscle and unrelated to the spine. Electronically Signed   By: Richardean Sale M.D.   On: 02/22/2019 15:33   NM GI Blood Loss  Result Date: 02/22/2019 CLINICAL DATA:  Bright red blood per rectum. History of black tarry stools. EXAM: NUCLEAR MEDICINE GASTROINTESTINAL BLEEDING SCAN TECHNIQUE: Sequential abdominal images were obtained following intravenous  administration of Tc-50m labeled red blood cells. RADIOPHARMACEUTICALS:  19.4 mCi Tc-45m pertechnetate in-vitro labeled red cells. COMPARISON:  CT 02/20/2019. FINDINGS: Following administration radiopharmaceutical standard on static images obtained. No evidence of GI bleed noted. IMPRESSION: No evidence of GI bleed. Electronically Signed   By: Marcello Moores  Register   On: 02/22/2019 15:07   CT ANKLE RIGHT WO CONTRAST  Result Date: 02/22/2019 CLINICAL DATA:  The patient suffered right ankle fractures in a trip and fall 02/06/2019. She underwent fixation 02/08/2019. The patient was then admitted to the hospital 02/20/2019 with chest discomfort and oxygen desaturation, elevated white blood cell count and possible sepsis. Subsequent encounter. EXAM: CT OF THE RIGHT ANKLE WITHOUT CONTRAST TECHNIQUE: Multidetector CT imaging of the right ankle was performed according to the standard protocol. Multiplanar CT image reconstructions were also generated. COMPARISON:  CT and plain films right ankle 02/06/2019. Plain films right ankle 02/08/2019. FINDINGS: Bones/Joint/Cartilage The patient is status post plate and screw fixation of a posterior malleolar fracture and repair of the syndesmosis with wires and plate along the distal fibula. Hardware is intact without evidence of loosening or infection. No bony destructive change or periosteal reaction is identified. Inferiorly displaced transverse fracture through the base of the medial malleolus is unchanged. There is  also a fracture of the anterior corner of the distal tibia on the lateral side where a fracture fragment measuring 1 cm in diameter is laterally rotated and displaced, unchanged. Lateral tilt and posterior subluxation of the talus at the tibiotalar joint have been reduced. Posterior malleolar fracture is in near anatomic position and alignment. There is mild widening of the medial clear space. Tiny subchondral cyst in both the medial and lateral talar dome are again  seen. Mild midfoot osteoarthritis is noted. There is a very small plantar calcaneal spur. Ligaments Suboptimally assessed by CT. Muscles and Tendons Intact and normal in appearance. No evidence of tenosynovitis or tendon entrapment. Soft tissues No joint effusion. Mild soft tissue swelling is seen about the ankle. No focal fluid collection or soft tissue gas. IMPRESSION: Negative for evidence of osteomyelitis.  No abscess is identified. Postoperative change as described above. Position and alignment of the patient's fractures are improved. Electronically Signed   By: Inge Rise M.D.   On: 02/22/2019 13:48   ECHOCARDIOGRAM COMPLETE  Result Date: 02/22/2019   ECHOCARDIOGRAM REPORT   Patient Name:   MIRAYA HARTINGER Date of Exam: 02/22/2019 Medical Rec #:  [id number]       Height:       68.0 in Accession #:    [id number]      Weight:       305.0 lb Date of Birth:  05/08/1945       BSA:          2.45 m Patient Age:    38 years        BP:           123/74 mmHg Patient Gender: F               HR:           139 bpm. Exam Location:  [facility] Procedure: 2D Echo, Color Doppler and Cardiac Doppler Indications:     Bacteremia 790.7  History:         Patient has no prior history of Echocardiogram examinations.                  Risk Factors:Hypertension.  Sonographer:     Sherrie Sport RDCS (AE) Referring Phys:  [id number] Bradly Bienenstock Diagnosing Phys: Yolonda Kida MD  Sonographer Comments: Technically difficult study due to poor echo windows, suboptimal parasternal window, no apical window and no subcostal window. IMPRESSIONS  1. Left ventricular ejection fraction, by visual estimation, is 65 to 70%. The left ventricle has normal function. Normal left ventricular posterior wall thickness. There is no left ventricular hypertrophy.  2. The left ventricle has no regional wall motion abnormalities.  3. Global right ventricle has normal systolic function.The right ventricular size is normal. No increase in right ventricular  wall thickness.  4. Left atrial size was normal.  5. Right atrial size was normal.  6. The mitral valve is normal in structure. Trivial mitral valve regurgitation.  7. The tricuspid valve is normal in structure.  8. The tricuspid valve is normal in structure. Tricuspid valve regurgitation is mild.  9. The aortic valve is normal in structure. Aortic valve regurgitation is not visualized. 10. The pulmonic valve was abnormal. Pulmonic valve regurgitation is not visualized. 11. The aortic root was not well visualized. 12. Normal pulmonary artery systolic pressure. FINDINGS  Left Ventricle: Left ventricular ejection fraction, by visual estimation, is 65 to 70%. The left ventricle has normal function. The left ventricle has no  regional wall motion abnormalities. Normal left ventricular posterior wall thickness. There is no left ventricular hypertrophy. Left ventricular diastolic parameters were normal. Right Ventricle: The right ventricular size is normal. No increase in right ventricular wall thickness. Global RV systolic function is has normal systolic function. The tricuspid regurgitant velocity is 2.19 m/s, and with an assumed right atrial pressure  of 10 mmHg, the estimated right ventricular systolic pressure is normal at 29.2 mmHg. Left Atrium: Left atrial size was normal in size. Right Atrium: Right atrial size was normal in size Pericardium: There is no evidence of pericardial effusion. Mitral Valve: The mitral valve is normal in structure. Trivial mitral valve regurgitation. Tricuspid Valve: The tricuspid valve is normal in structure. Tricuspid valve regurgitation is mild. Aortic Valve: The aortic valve is normal in structure. Aortic valve regurgitation is not visualized. Pulmonic Valve: The pulmonic valve was abnormal. Pulmonic valve regurgitation is not visualized. Pulmonic regurgitation is not visualized. Aorta: The aortic root was not well visualized. IAS/Shunts: No atrial level shunt detected by color flow  Doppler.  LEFT VENTRICLE PLAX 2D LVIDd:         3.21 cm LVIDs:         2.05 cm LV PW:         1.38 cm LV IVS:        1.14 cm LVOT diam:     2.00 cm LV SV:         28 ml LV SV Index:   10.46 LVOT Area:     3.14 cm  LEFT ATRIUM         Index LA diam:    3.60 cm 1.47 cm/m   AORTA Ao Root diam: 2.50 cm TRICUSPID VALVE TR Peak grad:   19.2 mmHg TR Vmax:        219.00 cm/s  SHUNTS Systemic Diam: 2.00 cm  Yolonda Kida MD Electronically signed by Yolonda Kida MD Signature Date/Time: 02/22/2019/12:48:45 PM    Final    COLONOSCOPY 02/22/19    ASSESSMENT / PLAN:  Hemorrhagic shock + Septic Shock -Currently off of vasopressors-as of 02/22/19 -Status post multiple blood transfusions - bouts of melena -slowing down now.  -source of sepsis is MSSA bacteremia  -ID on case - appreciate input - on Ancef  -Have added GI stool panel -Clinically improved today -Reviewed case and care plan with husband in person   Acute Blood loss anemia in setting of GI Bleed Thrombocytosis -Monitor for S/Sx of bleeding -Trend CBC (H&H q6h) -SCD's for VTE Prophylaxis (no chemical prophylaxis given bleed) -Transfuse for Hgb <8 -Protonix gtt -s/p EGD - unremarkable, s/p C-scope - diverticular dz worse at sigmoid - pictorial documentation below  AKI - KDIGO2 -prerenal - due to contraction and intraavascular volume depletion due to hemoragic shock  - IVF rescusitaion    Right ankle fracture s/p recent ORIF -Ortho consult to remove cast to evaluate surgical site- no signs of infection   DISPOSITION: ICU GOALS OF CARE: Full Code VTE PROPHYLAXIS: SCD'S UPDATES: Updated pt at bedside 02/20/2019  Critical care provider statement:    Critical care time (minutes):  32   Critical care time was exclusive of:  Separately billable procedures and  treating other patients   Critical care was necessary to treat or prevent imminent or  life-threatening deterioration of the following conditions:   Acute blood loss  anemia, hemorrhagic shock, septic shock, multiple comorbid conditions   Critical care was time spent personally by me on the following  activities:  Development of treatment plan with patient or surrogate,  discussions with consultants, evaluation of patient's response to  treatment, examination of patient, obtaining history from patient or  surrogate, ordering and performing treatments and interventions, ordering  and review of laboratory studies and re-evaluation of patient's condition   I assumed direction of critical care for this patient from another  provider in my specialty: no      Ottie Glazier, M.D.  [facility]

## 2019-02-23 NOTE — Progress Notes (Signed)
GI Inpatient Follow-up Note  Patient Identification: Jasmine Morrow is a 74 y.o. female with no significant past medical history admitted for hypotension with sepsis and acute blood loss anemia with blood per rectum. Notably, she underwent surgery for an ankle fracture on 02/08/19, and was at [facility] for rehab. Procedures were initially unable to be performed due to hypotension despite pressors. Tagged RBC scan on 02/20/19 did not reveal a source of bleeding. 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 Dr. Alice Reichert was asked to evaluate patient for procedures yesterday, as she became normotensive off pressors. EGD - 1 cm hiatal hernia, gastritis in prepyloric region, and two non-bleeding superficial ulcers without stigmata of bleeding in gastric fundus. Colonoscopy - hematin at TI, red blood in entire colon, sigmoid and ascending diverticulosis without evidence of diverticular bleeding. If acute bleeding recurred, CTA vs repeat tagged RBC scan was recommended.   Patient was drowsy and difficult to arouse during visit today. Responded with grunts to voice. No family at bedside.   Discussed RN with and Dr. Lanney Gins. Patient was intubated overnight, now extubated. Remained hypotensive overnight, BP slightly improved this morning. She received 1 unit PRBCs last night, Hgb stable at 8.3 this morning, then 7.8 on recheck 4 hours later. No acute bleeding events noted.  Subjective:  Scheduled Inpatient Medications:   calcium-vitamin D  2 tablet Oral Daily   Chlorhexidine Gluconate Cloth  6 each Topical Daily   loratadine  10 mg Oral Daily   [START ON 02/24/2019] pantoprazole  40 mg Intravenous Q12H    Continuous Inpatient Infusions:     ceFAZolin (ANCEF) IV 2 g (02/23/19 0655)   fentaNYL infusion INTRAVENOUS 300 mcg/hr (02/23/19 0402)   pantoprozole (PROTONIX) infusion 8 mg/hr (02/23/19 0524)   phenylephrine (NEO-SYNEPHRINE)  Adult infusion 40 mcg/min (02/23/19 0447)   potassium chloride     propofol (DIPRIVAN) infusion 25 mcg/kg/min (02/23/19 0416)    PRN Inpatient Medications:  acetaminophen, bisacodyl, hydroxypropyl methylcellulose / hypromellose, ondansetron, traZODone  Review of Systems: Constitutional: Weight is stable.  Eyes: No changes in vision. ENT: No oral lesions, sore throat.  GI: see HPI.  Heme/Lymph: No easy bruising.  CV: No chest pain.  GU: No hematuria.  Integumentary: No rashes.  Neuro: No headaches.  Psych: No depression/anxiety.  Endocrine: No heat/cold intolerance.  Allergic/Immunologic: No urticaria.  Resp: No cough, SOB.  Musculoskeletal: No joint swelling.    Physical Examination: BP (!) 89/56 (BP Location: Right Arm)   Pulse 99   Temp 97.8 F (36.6 C) (Axillary)   Resp 12   Wt 131.8 kg   SpO2 100%   BMI 44.18 kg/m  Gen: NAD, sleeping during visit, difficult to arouse but responds to voice HEENT: PEERLA, Neck: supple, no JVD Chest: CTA bilaterally, no wheezes, crackles, or other adventitious sounds CV: RRR, no m/g/c/r Abd: soft, mildly distended, NT, +BS in all four quadrants; no HSM, guarding, ridigity, or rebound tenderness Rectal: Dark brown stool in rectal tubing. Some black stool in rectal  Ext: no edema, well perfused with 2+ pulses, Skin: no rash or lesions noted Lymph: no LAD  Data: Lab Results  Component Value Date   WBC 21.3 (H) 02/23/2019   HGB 8.3 (L) 02/23/2019   HCT 26.6 (L) 02/23/2019   MCV 93.0 02/23/2019   PLT 514 (H) 02/23/2019   Recent Labs  Lab 02/22/19 2057 02/23/19 0355 02/23/19 0540  HGB 6.9* 7.7* 8.3*   Lab Results  Component Value Date   NA 147 (H) 02/23/2019   K 2.8 (L) 02/23/2019   CL 116 (H) 02/23/2019   CO2 21 (L) 02/23/2019   BUN 58 (H) 02/23/2019   CREATININE 1.40 (H) 02/23/2019   Lab Results  Component Value Date   ALT 18 02/21/2019   AST 22 02/21/2019   ALKPHOS 89 02/21/2019   BILITOT 0.7 02/21/2019    Recent Labs  Lab 02/21/19 0138  APTT 33  INR 1.4*   Assessment/Plan: Jasmine Morrow is a 74 y.o. female with no significant past medical history admitted for hypotension with sepsis and acute blood loss anemia, also s/p ankle fracture on 1/8.  1. GI bleeding with hypotension - EGD demonstrated 2 gastric ulcers, while colonoscopy revealed hematin at the TI and red blood throughout colon. Question bleeding from ulcers vs diverticular bleed.  2. Anemia - secondary to GI bleeding. Improved from 7.7 yesterday afternoon to 8.3 today after 1 unit PRBCs last night. 3. Sepsis with hypotension - MSSA bacteremia per ID, with source unknown. On empiric antibiotics.  Recommendations: - No further GI intervention at this time. Continue to monitor rectal output and H/H. Notify GI if signs or symptoms of bleeding recurs, will consider CTA if available vs repeat tagged RBC scan. - Continue IV pantoprazole 40 mg q 12 hrs. - Monitor serial Hgb/Hct. Transfuse <7.  Patient has been discussed with Dr. Alice Reichert, pending further GI recommendations at this time. Please call with questions or concerns.  Lavera Guise, PA-C [facility] Phone: [phone] Pager: [phone]

## 2019-02-23 NOTE — Consult Note (Addendum)
Pharmacy Antibiotic Note  Jasmine Morrow is a 74 y.o. female admitted on 02/20/2019 with sepsis of unknown source. Blood cultures with MSSA. Plan to evaluate surgical site (s/p right ankle fracture with ORIF). Pharmacy has been consulted for cefazolin dosing. ID following.    Plan: Day 4 of abx. Will continue with Cefazolin 2 g IV q8h. F/u with sensitivities. Repeat Bcx on 1/22 still pending, currently NGTD.   Weight: 290 lb 9.1 oz (131.8 kg)  Temp (24hrs), Avg:97.8 F (36.6 C), Min:97.2 F (36.2 C), Max:98.1 F (36.7 C)  Recent Labs  Lab 02/20/19 0955 02/20/19 1159 02/20/19 1609 02/21/19 0138 02/21/19 0848 02/22/19 0100 02/23/19 0540  WBC 48.0*  --   --  34.9* 35.2* 25.9* 21.3*  CREATININE 1.92*  --   --  1.71* 1.88* 1.73* 1.40*  LATICACIDVEN  --  2.4* 5.1* 1.1  --   --   --   VANCORANDOM  --   --   --  29  --   --   --     Estimated Creatinine Clearance: 52.2 mL/min (A) (by C-G formula based on SCr of 1.4 mg/dL (H)).    Allergies  Allergen Reactions  . Celecoxib Nausea Only  . Hydrocodone-Acetaminophen Nausea And Vomiting  . Meloxicam Nausea Only  . Morphine Sulfate Er Beads Itching  . Naproxen Swelling  . Rofecoxib Nausea Only  . Sulfa Antibiotics Other (See Comments)    Reaction: unknown  . Ciprofloxacin Rash  . Nickel Rash  . Tramadol-Acetaminophen Nausea Only and Rash    Antimicrobials this admission: Vancomycin 1/20 >> 1/21 Cefepime 1/20 >> 1/21 Cefazolin 1/21 >>   Microbiology results: 1/20 BCx: MSSA 1/20 UCx: insignificant growth 1/22 Bcx: pending  Thank you for allowing pharmacy to be a part of this patient's care.  Oswald Hillock, PharmD, BCPS 02/23/2019 7:34 AM

## 2019-02-23 NOTE — Progress Notes (Signed)
[place] for Electrolyte Monitoring and Replacement   Recent Labs: Potassium (mmol/L)  Date Value  02/23/2019 2.8 (L)   Calcium (mg/dL)  Date Value  02/23/2019 8.7 (L)   Albumin (g/dL)  Date Value  02/21/2019 2.0 (L)   Sodium (mmol/L)  Date Value  02/23/2019 147 (H)     Assessment: 74 year old female admitted on 02/20/2019 with sepsis of unknown source. Blood cultures with MSSA. Concern for GIB.  Goal of Therapy:  Electrolytes WNL  Plan:  Will give Potassium 10 mEq IV x 4. Will follow up electrolytes with morning labs.  Oswald Hillock ,PharmD, BCPS Clinical Pharmacist 02/23/2019 7:30 AM

## 2019-02-24 LAB — CBC WITH DIFFERENTIAL/PLATELET
Abs Immature Granulocytes: 0.14 10*3/uL — ABNORMAL HIGH (ref 0.00–0.07)
Basophils Absolute: 0 10*3/uL (ref 0.0–0.1)
Basophils Relative: 0 %
Eosinophils Absolute: 0.2 10*3/uL (ref 0.0–0.5)
Eosinophils Relative: 2 %
HCT: 23.7 % — ABNORMAL LOW (ref 36.0–46.0)
Hemoglobin: 7.4 g/dL — ABNORMAL LOW (ref 12.0–15.0)
Immature Granulocytes: 1 %
Lymphocytes Relative: 8 %
Lymphs Abs: 0.8 10*3/uL (ref 0.7–4.0)
MCH: 28.7 pg (ref 26.0–34.0)
MCHC: 31.2 g/dL (ref 30.0–36.0)
MCV: 91.9 fL (ref 80.0–100.0)
Monocytes Absolute: 0.7 10*3/uL (ref 0.1–1.0)
Monocytes Relative: 7 %
Neutro Abs: 8.1 10*3/uL — ABNORMAL HIGH (ref 1.7–7.7)
Neutrophils Relative %: 82 %
Platelets: 345 10*3/uL (ref 150–400)
RBC: 2.58 MIL/uL — ABNORMAL LOW (ref 3.87–5.11)
RDW: 18.5 % — ABNORMAL HIGH (ref 11.5–15.5)
WBC: 9.9 10*3/uL (ref 4.0–10.5)
nRBC: 1.4 % — ABNORMAL HIGH (ref 0.0–0.2)

## 2019-02-24 LAB — TYPE AND SCREEN
ABO/RH(D): O POS
Antibody Screen: POSITIVE
Donor AG Type: NEGATIVE
Donor AG Type: NEGATIVE
Donor AG Type: NEGATIVE
Donor AG Type: NEGATIVE
Donor AG Type: NEGATIVE
Donor AG Type: NEGATIVE
PT AG Type: NEGATIVE
Unit division: 0
Unit division: 0
Unit division: 0
Unit division: 0
Unit division: 0
Unit division: 0
Unit division: 0
Unit division: 0

## 2019-02-24 LAB — BPAM RBC
Blood Product Expiration Date: 202102132359
Blood Product Expiration Date: 202102212359
Blood Product Expiration Date: 202102212359
Blood Product Expiration Date: 202102232359
Blood Product Expiration Date: 202102232359
Blood Product Expiration Date: 202102282359
Blood Product Expiration Date: 202103012359
Blood Product Expiration Date: 202103012359
ISSUE DATE / TIME: 202101201748
ISSUE DATE / TIME: 202101202000
ISSUE DATE / TIME: 202101210313
ISSUE DATE / TIME: 202101212023
ISSUE DATE / TIME: 202101220449
ISSUE DATE / TIME: 202101222236
Unit Type and Rh: 5100
Unit Type and Rh: 5100
Unit Type and Rh: 5100
Unit Type and Rh: 5100
Unit Type and Rh: 5100
Unit Type and Rh: 5100
Unit Type and Rh: 5100
Unit Type and Rh: 5100

## 2019-02-24 LAB — BASIC METABOLIC PANEL
Anion gap: 8 (ref 5–15)
BUN: 44 mg/dL — ABNORMAL HIGH (ref 8–23)
CO2: 23 mmol/L (ref 22–32)
Calcium: 8.7 mg/dL — ABNORMAL LOW (ref 8.9–10.3)
Chloride: 115 mmol/L — ABNORMAL HIGH (ref 98–111)
Creatinine, Ser: 1.01 mg/dL — ABNORMAL HIGH (ref 0.44–1.00)
GFR calc Af Amer: >60 mL/min (ref >60)
GFR calc non Af Amer: 56 mL/min — ABNORMAL LOW (ref >60)
Glucose, Bld: 96 mg/dL (ref 70–99)
Potassium: 2.7 mmol/L — CL (ref 3.5–5.1)
Sodium: 146 mmol/L — ABNORMAL HIGH (ref 135–145)

## 2019-02-24 LAB — CULTURE, BLOOD (ROUTINE X 2): Special Requests: ADEQUATE

## 2019-02-24 LAB — GLUCOSE, CAPILLARY
Glucose-Capillary: 84 mg/dL (ref 70–99)
Glucose-Capillary: 96 mg/dL (ref 70–99)
Glucose-Capillary: 92 mg/dL (ref 70–99)

## 2019-02-24 LAB — PHOSPHORUS: Phosphorus: 2.6 mg/dL (ref 2.5–4.6)

## 2019-02-24 LAB — POTASSIUM: Potassium: 3.4 mmol/L — ABNORMAL LOW (ref 3.5–5.1)

## 2019-02-24 LAB — MPO/PR-3 (ANCA) ANTIBODIES
ANCA Proteinase 3: <3.5 U/mL (ref 0.0–3.5)
Myeloperoxidase Abs: <9 U/mL (ref 0.0–9.0)

## 2019-02-24 LAB — MAGNESIUM: Magnesium: 2.3 mg/dL (ref 1.7–2.4)

## 2019-02-24 MED ORDER — POTASSIUM CHLORIDE 10 MEQ/50ML IV SOLN
10.0000 meq | INTRAVENOUS | Status: AC
  Administered 2019-02-24 (×2): 10 meq via INTRAVENOUS
  Filled 2019-02-24 (×2): qty 50

## 2019-02-24 MED ORDER — POTASSIUM CHLORIDE 10 MEQ/50ML IV SOLN
10.0000 meq | INTRAVENOUS | Status: AC
  Administered 2019-02-24 (×6): 10 meq via INTRAVENOUS
  Filled 2019-02-24 (×6): qty 50

## 2019-02-24 NOTE — Progress Notes (Signed)
ID-  Chart reviewed Spoke to her nurse  Pt off pressors Not extubated Apparently was a little restless and impulsive this morning as per her nurse-  Patient Vitals for the past 24 hrs:  BP Temp Temp src Pulse Resp SpO2  02/24/19 0900 -- -- -- -- (!) 22 --  02/24/19 0800 137/68 98 F (36.7 C) Oral (!) 112 20 100 %  02/24/19 0700 -- -- -- (!) 112 (!) 21 100 %  02/24/19 0600 139/66 -- -- 99 12 100 %  02/24/19 0400 (!) 148/59 97.9 F (36.6 C) Oral 95 11 100 %  02/24/19 0200 (!) 145/63 -- -- (!) 102 11 100 %  02/24/19 0009 (!) 143/67 -- -- (!) 105 (!) 9 100 %  02/23/19 2200 (!) 152/48 -- -- (!) 103 13 100 %  02/23/19 2006 (!) 176/51 97.8 F (36.6 C) Oral (!) 103 13 100 %  02/23/19 1800 (!) 125/52 -- -- (!) 108 13 100 %  02/23/19 1700 126/65 98.4 F (36.9 C) -- (!) 105 15 100 %  02/23/19 1600 126/70 -- -- (!) 104 16 100 %  02/23/19 1500 125/66 -- -- (!) 114 16 100 %  02/23/19 1400 (!) 136/56 -- -- (!) 106 12 100 %  02/23/19 1300 131/68 -- -- (!) 107 12 100 %  02/23/19 1200 124/61 98.4 F (36.9 C) Axillary 97 15 100 %  02/23/19 1100 131/66 -- -- (!) 101 14 100 %   CBC Latest Ref Rng & Units 02/24/2019 02/23/2019 02/23/2019  WBC 4.0 - 10.5 K/uL 9.9 - -  Hemoglobin 12.0 - 15.0 g/dL 7.4(L) 8.1(L) 7.4(L)  Hematocrit 36.0 - 46.0 % 23.7(L) 25.4(L) 23.1(L)  Platelets 150 - 400 K/uL 345 - -    CMP Latest Ref Rng & Units 02/24/2019 02/23/2019 02/22/2019  Glucose 70 - 99 mg/dL 96 89 150(H)  BUN 8 - 23 mg/dL 44(H) 58(H) 90(H)  Creatinine 0.44 - 1.00 mg/dL 1.01(H) 1.40(H) 1.73(H)  Sodium 135 - 145 mmol/L 146(H) 147(H) 142  Potassium 3.5 - 5.1 mmol/L 2.7(LL) 2.8(L) 3.2(L)  Chloride 98 - 111 mmol/L 115(H) 116(H) 112(H)  CO2 22 - 32 mmol/L 23 21(L) 20(L)  Calcium 8.9 - 10.3 mg/dL 8.7(L) 8.7(L) 8.3(L)  Total Protein 6.5 - 8.1 g/dL - - -  Total Bilirubin 0.3 - 1.2 mg/dL - - -  Alkaline Phos 38 - 126 U/L - - -  AST 15 - 41 U/L - - -  ALT 0 - 44 U/L - - -    Colonoscopy report Multiple small  and large-mouthed diverticula were found in the sigmoid colon. There was no evidence of diverticular bleeding. Old Blood was seen in the ileocaecal valve  Endoscopy no gastric or esphageal bleeding source NM scan no bleeding identified  Micro- MSSA  - 02/20/19  02/22/19- Bc Ng  MSSA bacteremia- -source likely the wound ( deroofed hemorrhagic blister on the front of the rt. leg( surgical site at the back of the leg  looks good) Would do a superficial culture Repeat blood culture neg Ct of the leg shows hardware but no collection or evidence of infection Needs TEE- will discuss with cardiologist tomorrow  Shock secondary to acute GI bleed and sepsis- off pressors and has improved  Acute blood loss anemia- colonoscopy revealed red blood and multiple diverticula Upper endoscopy no obvious source  Discussed with her nurse

## 2019-02-24 NOTE — Consult Note (Signed)
Pharmacy Antibiotic Note  Jasmine Morrow is a 74 y.o. female admitted on 02/20/2019 with sepsis of unknown source. Blood cultures with MSSA. Plan to evaluate surgical site (s/p right ankle fracture with ORIF). Pharmacy has been consulted for cefazolin dosing. ID following.    Plan: Day 5 of abx. Will continue with Cefazolin 2 g IV q8h. F/u with sensitivities. Calling lab to get sensitivities released. Repeat Bcx on 1/22 still pending, currently NGTD.   Weight: 290 lb 9.1 oz (131.8 kg)  Temp (24hrs), Avg:98.1 F (36.7 C), Min:97.8 F (36.6 C), Max:98.4 F (36.9 C)  Recent Labs  Lab 02/20/19 0955 02/20/19 1159 02/20/19 1609 02/21/19 0138 02/21/19 0848 02/22/19 0100 02/23/19 0540 02/24/19 0300  WBC   < >  --   --  34.9* 35.2* 25.9* 21.3* 9.9  CREATININE   < >  --   --  1.71* 1.88* 1.73* 1.40* 1.01*  LATICACIDVEN  --  2.4* 5.1* 1.1  --   --   --   --   VANCORANDOM  --   --   --  29  --   --   --   --    < > = values in this interval not displayed.    Estimated Creatinine Clearance: 72.4 mL/min (A) (by C-G formula based on SCr of 1.01 mg/dL (H)).    Allergies  Allergen Reactions  . Celecoxib Nausea Only  . Hydrocodone-Acetaminophen Nausea And Vomiting  . Meloxicam Nausea Only  . Morphine Sulfate Er Beads Itching  . Naproxen Swelling  . Rofecoxib Nausea Only  . Sulfa Antibiotics Other (See Comments)    Reaction: unknown  . Ciprofloxacin Rash  . Nickel Rash  . Tramadol-Acetaminophen Nausea Only and Rash    Antimicrobials this admission: Vancomycin 1/20 >> 1/21 Cefepime 1/20 >> 1/21 Cefazolin 1/21 >>   Microbiology results: 1/20 BCx: MSSA 1/20 UCx: insignificant growth 1/22 Bcx: pending  Thank you for allowing pharmacy to be a part of this patient's care.  Oswald Hillock, PharmD, BCPS 02/24/2019 8:13 AM

## 2019-02-24 NOTE — Progress Notes (Signed)
[place] for Electrolyte Monitoring and Replacement   Recent Labs: Potassium (mmol/L)  Date Value  02/24/2019 2.7 (LL)   Magnesium (mg/dL)  Date Value  02/24/2019 2.3   Calcium (mg/dL)  Date Value  02/24/2019 8.7 (L)   Albumin (g/dL)  Date Value  02/21/2019 2.0 (L)   Phosphorus (mg/dL)  Date Value  02/24/2019 2.6   Sodium (mmol/L)  Date Value  02/24/2019 146 (H)     Assessment: 74 year old female admitted on 02/20/2019 with sepsis of unknown source. Blood cultures with MSSA. Concern for GIB.  Goal of Therapy:  Electrolytes WNL  Plan:  Medical team gave Potassium 10 mEq IV x 6. Will follow up electrolytes with morning labs.  Oswald Hillock ,PharmD, BCPS Clinical Pharmacist 02/24/2019 8:11 AM

## 2019-02-24 NOTE — Progress Notes (Signed)
I met this patient after visits with other. She is preparing for surgery on a broken ankle. We chatted for awhile and I prayed with her, I assured her someone would follow up. She was appreciative of the visit.

## 2019-02-24 NOTE — Progress Notes (Signed)
GI Inpatient Follow-up Note  Patient Identification: Jasmine Morrow is a 74 y.o. female with no significant past medical history admitted for hypotension with sepsis and acute blood loss anemia with blood per rectum. Notably, she underwent surgery for an ankle fracture on 02/08/19, and was at [facility] for rehab. Procedures were initially unable to be performed due to hypotension despite pressors. Tagged RBC scan on 02/20/19 did not reveal a source of bleeding. 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 Dr. Alice Reichert was asked to evaluate patient for procedures yesterday, as she became normotensive off pressors. EGD - 1 cm hiatal hernia, gastritis in prepyloric region, and two non-bleeding superficial ulcers without stigmata of bleeding in gastric fundus. Colonoscopy - hematin at TI, red blood in entire colon, sigmoid and ascending diverticulosis without evidence of diverticular bleeding. If acute bleeding recurred, CTA vs repeat tagged RBC scan was recommended.   Jasmine Morrow is more alert today. No new GI complaints.  No acute bleeding events noted per RN.  Subjective:  Scheduled Inpatient Medications:  . calcium-vitamin D  2 tablet Oral Daily  . chlorhexidine  15 mL Mouth Rinse BID  . Chlorhexidine Gluconate Cloth  6 each Topical Daily  . loratadine  10 mg Oral Daily  . mouth rinse  15 mL Mouth Rinse q12n4p    Continuous Inpatient Infusions:   .  ceFAZolin (ANCEF) IV 2 g (02/24/19 [id number])  . famotidine (PEPCID) IV 20 mg (02/23/19 2253)  . lactated ringers 100 mL/hr at 02/24/19 0300  . potassium chloride 10 mEq (02/24/19 0748)    PRN Inpatient Medications:  acetaminophen, bisacodyl, hydroxypropyl methylcellulose / hypromellose, ondansetron  Review of Systems: Not obtained due to critical illness.   Physical Examination: BP 139/66   Pulse 99   Temp 97.9 F (36.6 C) (Oral)   Resp 12   Wt 131.8 kg   SpO2 100%    BMI 44.18 kg/m  Gen: NAD, follows verbal commands and answers questions simply but appropriately HEENT: PEERLA Neck: supple, no JVD Chest: CTA bilaterally, no wheezes, crackles, or other adventitious sounds CV: RRR, no m/g/c/r Abd: soft, mildly distended, NT, +BS in all four quadrants; no HSM, guarding, ridigity, or rebound tenderness Rectal: Dark brown stool in rectal tubing. No obvious melena. Ext: no edema, well perfused with 2+ pulses Skin: no rash or lesions noted Lymph: no LAD  Data: Lab Results  Component Value Date   WBC 9.9 02/24/2019   HGB 7.4 (L) 02/24/2019   HCT 23.7 (L) 02/24/2019   MCV 91.9 02/24/2019   PLT 345 02/24/2019   Recent Labs  Lab 02/23/19 1518 02/23/19 2035 02/24/19 0300  HGB 7.4* 8.1* 7.4*   Lab Results  Component Value Date   NA 146 (H) 02/24/2019   K 2.7 (LL) 02/24/2019   CL 115 (H) 02/24/2019   CO2 23 02/24/2019   BUN 44 (H) 02/24/2019   CREATININE 1.01 (H) 02/24/2019   Lab Results  Component Value Date   ALT 18 02/21/2019   AST 22 02/21/2019   ALKPHOS 89 02/21/2019   BILITOT 0.7 02/21/2019   Recent Labs  Lab 02/21/19 0138  APTT 33  INR 1.4*   Assessment/Plan: Jasmine Morrow is a 74 y.o. female with no significant past medical history admitted for hypotension with sepsis and acute blood loss anemia, also s/p ankle fracture on 1/8.  1. GI bleeding with hypotension - EGD demonstrated 2 gastric ulcers, while colonoscopy revealed hematin  at the TI and red blood throughout colon. Question bleeding from diverticular bleed, possibly compounded by ulcers. On pantoprazole.   2. Anemia - secondary to GI bleeding. S/p multiple transfusions. 8.3 yesterday after 1 unit PRBCs the night before, now 7.4 this morning. Fewer episodes of melena noted. Likely hemodilution. 3. Sepsis with hypotension - MSSA bacteremia per ID, source unknown. On empiric antibiotics.  Recommendations: - No further GI intervention at this time. Continue to monitor rectal  output and H/H. Notify GI if signs or symptoms of bleeding recurs, consider CTA if available vs repeat tagged RBC scan. - Continue IV pantoprazole 40 mg q 12 hrs. - Monitor serial Hgb/Hct q 6 hrs. Transfuse <8.  Patient has been discussed with Dr. Alice Reichert, pending further GI recommendations at this time. Please call with questions or concerns.  Lavera Guise, PA-C [facility] [place] [facility] Phone: [phone] Pager: [phone]

## 2019-02-24 NOTE — Progress Notes (Signed)
Dressing change today and staples removed from posterior lateral approach.  Wound is Steri-Stripped.  No evidence of infection or drainage from that wound.  From her anterior medial ankle the areas of the blood best there is are partially healed.  Dr. Steva Ready wanted a culture and await that culture and then will redress wound.  When she is ready to get out of bed will need to have her in a cast.

## 2019-02-24 NOTE — Progress Notes (Signed)
eLink Physician-Brief Progress Note Patient Name: Jasmine Morrow DOB: [birth year] MRN: [id number]   Date of Service  02/24/2019  HPI/Events of Note  Labs resulted - potassium 3.4  eICU Interventions  Reviewed labs, 2 runs of potassium added, pharmacy is reviewing am labs, labs ordered for 5 am     Intervention Category Minor Interventions: Electrolytes abnormality - evaluation and management  Delsa Grana 02/24/2019, 9:48 PM

## 2019-02-24 NOTE — Progress Notes (Signed)
Name: [name] MRN: [id number] DOB: 1945/07/23    ADMISSION DATE:  02/20/2019 CONSULTATION DATE:  02/20/2019  REFERRING MD :  Dr. Manuella Ghazi  CHIEF COMPLAINT:  Chest pain  BRIEF PATIENT DESCRIPTION:  74 y.o. Female admitted 02/20/19 with Hemorrhagic shock in setting of Acute blood loss anemia due to GI bleed, along with questionable sepsis/septic shock of unknown etiology.  GI and ID following.  SIGNIFICANT EVENTS  1/20- Admission to Stepdown 1/20- GI, ID, PCCM consulted 02/23/19 -s/p EGD and c-scope 02/24/19- extubated , GI bleed stopped, reviewed plan with husband.  S/p eval by orho with dressing change, s/p eval by ID plan for TEE, clinically improved with more appropriate mentation  STUDIES:  1/20- CXR>> Lungs clear.  Cardiac silhouette within normal limits. 1/20- CT Abdomen & Pelvis w/o contrast>> 1. No CT evidence for acute intra-abdominal or intrapelvic abnormality. 2. Sigmoid colon diverticular disease without acute inflammatory change. 3. Nonvisualized gallbladder presumably due to prior cholecystectomy. Prominent extrahepatic bile duct which may be secondary to postsurgical change though suggest correlation with LFTs.  CULTURES: SARS-CoV-2 PCR 1/20>> Negative Influenza PCR 1/20>> Negative Blood cultures x2 1/20>> Urine 1/20>> Stool 1/20>> C. Diff antigen +, C. Diff toxin - (results indeterminate)  ANTIBIOTICS: Cefepime 1/20>> Flagyl 1/20>> Vancomycin 1/20>>  HISTORY OF PRESENT ILLNESS:   Jasmine Morrow is a 74 year old female with a past medical history notable for hypertension and recent right ankle fracture with ORIF who presented to [facility] ED on 02/20/2019 with complaints of chest pain.  EMS was picking her up today for her Orthopedic appointment when she began to complain of chest discomfort, and was noted to be hypoxic with O2 sats ranging from the 80's to 90's.   Upon presentation to the ED her BP was soft with BP 104/57, HR 94, afebrile.  Initial work-up in the ED  revealed anemia with hemoglobin of 5.6, WBC 48, platelets 1009, lactic acid 2.4, BNP 86, high-sensitivity troponin 35, BUN 99, creatinine 1.92, bicarb 17, anion gap 18.  Upon exam she was noted to be positive for melena.  Chest x-ray without evidence of pneumonia, urinalysis without evidence of UTI.  CT abdomen and pelvis negative for any acute intra-abdominal abnormality.  Her COVID-19 PCR is negative, influenza PCR is negative, and C. difficile antigen positive but C. difficile toxin negative (results indeterminate for C. Difficile).  She is admitted to stepdown unit by hospitalist for further work-up and treatment of hemorrhagic shock in the setting of acute blood loss anemia due to GI bleed and questionable septic shock of unknown etiology.  GI and ID are following.  PCCM was consulted for assistance in managing hypotension.  PAST MEDICAL HISTORY :   has a past medical history of Hypertension and Seasonal allergies.  has a past surgical history that includes Replacement total knee (Bilateral, 2008); Cesarean section; Tumor removal; Dilation and curettage of uterus; ORIF ankle fracture (Right, 02/08/2019); and Syndesmosis repair (Right, 02/08/2019). Prior to Admission medications   Medication Sig Start Date End Date Taking? Authorizing Provider  acetaminophen (TYLENOL) 500 MG tablet Take 1,000 mg by mouth every 6 (six) hours as needed for mild pain or headache.    Yes [provider]  albuterol (PROVENTIL HFA;VENTOLIN HFA) 108 (90 Base) MCG/ACT inhaler Inhale 2 puffs into the lungs every 4 (four) hours as needed. Patient taking differently: Inhale 2 puffs into the lungs every 4 (four) hours as needed for wheezing or shortness of breath.  05/09/16  Yes Wilhelmina Mcardle, MD  aspirin EC  325 MG tablet Take 1 tablet (325 mg total) by mouth daily. Patient taking differently: Take 325 mg by mouth 2 (two) times daily.  02/13/19  Yes Duanne Guess, PA-C  azithromycin (ZITHROMAX) 250 MG tablet Take 250 mg  by mouth daily. 02/15/19 02/20/19 Yes [provider]  bisacodyl (DULCOLAX) 10 MG suppository Place 1 suppository (10 mg total) rectally daily as needed for moderate constipation. 02/10/19  Yes Duanne Guess, PA-C  Calcium-Vitamin D 600-200 MG-UNIT per tablet Take 2 tablets by mouth daily.    Yes [provider]  carvedilol (COREG) 3.125 MG tablet Take 3.125 mg by mouth 2 (two) times daily. 12/07/18  Yes [provider]  cetirizine (ZYRTEC) 5 MG tablet Take 10 mg by mouth daily.    Yes [provider]  cyclobenzaprine (FLEXERIL) 10 MG tablet Take 10 mg by mouth daily as needed for muscle spasms.   Yes [provider]  fluocinonide cream (LIDEX) [id number] % Apply 1 application topically 3 (three) times daily as needed (mouth sores). Apply to mouth lesions as needed 01/17/19  Yes [provider]  hydroxypropyl methylcellulose / hypromellose (ISOPTO TEARS / GONIOVISC) 2.5 % ophthalmic solution Place 1 drop into both eyes every 8 (eight) hours as needed for dry eyes.    Yes [provider]  Multiple Vitamin (MULTIVITAMIN WITH MINERALS) TABS tablet Take 1 tablet by mouth daily.   Yes [provider]  Multiple Vitamins-Minerals (PRESERVISION AREDS 2 PO) Take 1 tablet by mouth 2 (two) times daily.   Yes [provider]  omeprazole (PRILOSEC) 20 MG capsule Take 20 mg by mouth 2 (two) times daily as needed (Heartburn).  04/23/15  Yes [provider]  ondansetron (ZOFRAN) 8 MG tablet Take 1 tablet (8 mg total) by mouth every 8 (eight) hours as needed for nausea or vomiting. 02/06/19  Yes Sable Feil, PA-C  traMADol (ULTRAM) 50 MG tablet Take 1-2 tablets (50-100 mg total) by mouth every 6 (six) hours as needed for severe pain. 02/10/19  Yes Duanne Guess, PA-C  triamterene-hydrochlorothiazide (MAXZIDE-25) 37.5-25 MG tablet Take 1 tablet by mouth daily. 03/21/18  Yes [provider]   Allergies  Allergen Reactions  .  Celecoxib Nausea Only  . Hydrocodone-Acetaminophen Nausea And Vomiting  . Meloxicam Nausea Only  . Morphine Sulfate Er Beads Itching  . Naproxen Swelling  . Rofecoxib Nausea Only  . Sulfa Antibiotics Other (See Comments)    Reaction: unknown  . Ciprofloxacin Rash  . Nickel Rash  . Tramadol-Acetaminophen Nausea Only and Rash    FAMILY HISTORY:  family history includes Bone cancer in her mother; COPD in her father. SOCIAL HISTORY:  reports that she quit smoking about 28 years ago. Her smoking use included cigarettes. She has a 60.00 pack-year smoking history. She has never used smokeless tobacco. She reports that she does not drink alcohol or use drugs.     REVIEW OF SYSTEMS:  Positives in BOLD: Pt currently denies all complaints Constitutional: Negative for fever, chills, weight loss, malaise/fatigue and diaphoresis.  HENT: Negative for hearing loss, ear pain, nosebleeds, congestion, sore throat, neck pain, tinnitus and ear discharge.   Eyes: Negative for blurred vision, double vision, photophobia, pain, discharge and redness.  Respiratory: Negative for cough, hemoptysis, sputum production, shortness of breath, wheezing and stridor.   Cardiovascular: Negative for chest pain, palpitations, orthopnea, claudication, leg swelling and PND.  Gastrointestinal: Negative for heartburn, nausea, vomiting, abdominal pain,  Genitourinary: Negative for dysuria, urgency, frequency, hematuria and  flank pain.  Musculoskeletal: Negative for myalgias, back pain, joint pain and falls.  Skin: Negative for itching and rash.  Neurological: Negative for dizziness, tingling, tremors, sensory change, speech change, focal weakness, seizures, loss of consciousness, weakness and headaches.  Endo/Heme/Allergies: Negative for environmental allergies and polydipsia. Does not bruise/bleed easily.  SUBJECTIVE:  Denies chest pain, SOB, cough, dizziness, abdominal pain, N/V, fever/chills Reports that it is cool in  the room Having intermittent melena as reported by nursing  VITAL SIGNS: Temp:  [97.8 F (36.6 C)-98.4 F (36.9 C)] 98 F (36.7 C) (01/24 0800) Pulse Rate:  [95-112] 112 (01/24 0800) Resp:  [9-22] 22 (01/24 0900) BP: (125-176)/(48-70) 137/68 (01/24 0800) SpO2:  [100 %] 100 % (01/24 0800)  PHYSICAL EXAMINATION: General:  Acute on chronically ill appearing female, laying in bed, on Nasal cannula, in NAD Neuro:  Awake, intermittenlty confused but with imprvement from previous  HEENT:  Atraumatic, normocephalic, neck supple, no JVD Cardiovascular:  RRR, s1s2, no M/R/G Lungs:  Clear to auscultation bilaterally, no wheezing or rales, even, nonlabored, normal effort Abdomen:  Obese, soft, nontender, nondistended, no guarding or rebound tenderness, BS hypoactive, rectal tube with melena Musculoskeletal:  No deformites Skin:  Warm and dry.  No obvious rashes, lesions, or ulcerations  Recent Labs  Lab 02/22/19 0100 02/23/19 0540 02/24/19 0300  NA 142 147* 146*  K 3.2* 2.8* 2.7*  CL 112* 116* 115*  CO2 20* 21* 23  BUN 90* 58* 44*  CREATININE 1.73* 1.40* 1.01*  GLUCOSE 150* 89 96   Recent Labs  Lab 02/22/19 0100 02/22/19 0917 02/23/19 0540 02/23/19 0907 02/23/19 1518 02/23/19 2035 02/24/19 0300  HGB 7.2*   < > 8.3*   < > 7.4* 8.1* 7.4*  HCT 22.2*   < > 26.6*   < > 23.1* 25.4* 23.7*  WBC 25.9*  --  21.3*  --   --   --  9.9  PLT 595*  --  514*  --   --   --  345   < > = values in this interval not displayed.   No results found. COLONOSCOPY 02/22/19    ASSESSMENT / PLAN:  Hemorrhagic shock + Septic Shock -Currently off of vasopressors-as of 02/22/19 -Status post multiple blood transfusions - bouts of melena -stopped today -source of sepsis is MSSA bacteremia  -ID on case - appreciate input - on Ancef  -Have added GI stool panel -Clinically improved today -Reviewed case and care plan with husband in person   Acute Blood loss anemia in setting of GI  Bleed Thrombocytosis -Monitor for S/Sx of bleeding -Trend CBC (H&H q6h) -SCD's for VTE Prophylaxis (no chemical prophylaxis given bleed) -Transfuse for Hgb <8 -Protonix gtt -s/p EGD - unremarkable, s/p C-scope - diverticular dz worse at sigmoid - pictorial documentation below  AKI - KDIGO2 -prerenal - due to contraction and intraavascular volume depletion due to hemoragic shock  - IVF rescusitaion    Right ankle fracture s/p recent ORIF -Ortho consult to remove cast to evaluate surgical site- no signs of infection 1/24-redressed by Dr Rudene Christians   DISPOSITION: ICU GOALS OF CARE: Full Code VTE PROPHYLAXIS: SCD'S UPDATES: Updated pt at bedside 02/20/2019  Critical care provider statement:    Critical care time (minutes):  32   Critical care time was exclusive of:  Separately billable procedures and  treating other patients   Critical care was necessary to treat or prevent imminent or  life-threatening deterioration of the following conditions:   Acute blood loss  anemia, hemorrhagic shock, septic shock, multiple comorbid conditions   Critical care was time spent personally by me on the following  activities:  Development of treatment plan with patient or surrogate,  discussions with consultants, evaluation of patient's response to  treatment, examination of patient, obtaining history from patient or  surrogate, ordering and performing treatments and interventions, ordering  and review of laboratory studies and re-evaluation of patient's condition   I assumed direction of critical care for this patient from another  provider in my specialty: no      Ottie Glazier, M.D.  [facility] [place]

## 2019-02-25 ENCOUNTER — Encounter: Payer: Self-pay | Admitting: *Deleted

## 2019-02-25 DIAGNOSIS — K625 Hemorrhage of anus and rectum: Secondary | ICD-10-CM

## 2019-02-25 DIAGNOSIS — S80921D Unspecified superficial injury of right lower leg, subsequent encounter: Secondary | ICD-10-CM

## 2019-02-25 DIAGNOSIS — B9561 Methicillin susceptible Staphylococcus aureus infection as the cause of diseases classified elsewhere: Secondary | ICD-10-CM

## 2019-02-25 DIAGNOSIS — Z967 Presence of other bone and tendon implants: Secondary | ICD-10-CM

## 2019-02-25 LAB — COMPREHENSIVE METABOLIC PANEL
ALT: 6 U/L (ref 0–44)
AST: 28 U/L (ref 15–41)
Albumin: 2.2 g/dL — ABNORMAL LOW (ref 3.5–5.0)
Alkaline Phosphatase: 84 U/L (ref 38–126)
Anion gap: 7 (ref 5–15)
BUN: 35 mg/dL — ABNORMAL HIGH (ref 8–23)
CO2: 21 mmol/L — ABNORMAL LOW (ref 22–32)
Calcium: 8.9 mg/dL (ref 8.9–10.3)
Chloride: 120 mmol/L — ABNORMAL HIGH (ref 98–111)
Creatinine, Ser: 0.91 mg/dL (ref 0.44–1.00)
GFR calc Af Amer: >60 mL/min (ref >60)
GFR calc non Af Amer: >60 mL/min (ref >60)
Glucose, Bld: 97 mg/dL (ref 70–99)
Potassium: 3.6 mmol/L (ref 3.5–5.1)
Sodium: 148 mmol/L — ABNORMAL HIGH (ref 135–145)
Total Bilirubin: 0.8 mg/dL (ref 0.3–1.2)
Total Protein: 5.1 g/dL — ABNORMAL LOW (ref 6.5–8.1)

## 2019-02-25 LAB — GLUCOSE, CAPILLARY: Glucose-Capillary: 90 mg/dL (ref 70–99)

## 2019-02-25 LAB — CBC WITH DIFFERENTIAL/PLATELET
Abs Immature Granulocytes: 0.06 10*3/uL (ref 0.00–0.07)
Basophils Absolute: 0 10*3/uL (ref 0.0–0.1)
Basophils Relative: 0 %
Eosinophils Absolute: 0.2 10*3/uL (ref 0.0–0.5)
Eosinophils Relative: 2 %
HCT: 24.8 % — ABNORMAL LOW (ref 36.0–46.0)
Hemoglobin: 7.5 g/dL — ABNORMAL LOW (ref 12.0–15.0)
Immature Granulocytes: 1 %
Lymphocytes Relative: 10 %
Lymphs Abs: 0.9 10*3/uL (ref 0.7–4.0)
MCH: 28 pg (ref 26.0–34.0)
MCHC: 30.2 g/dL (ref 30.0–36.0)
MCV: 92.5 fL (ref 80.0–100.0)
Monocytes Absolute: 0.6 10*3/uL (ref 0.1–1.0)
Monocytes Relative: 6 %
Neutro Abs: 7.1 10*3/uL (ref 1.7–7.7)
Neutrophils Relative %: 81 %
Platelets: 303 10*3/uL (ref 150–400)
RBC: 2.68 MIL/uL — ABNORMAL LOW (ref 3.87–5.11)
RDW: 18.6 % — ABNORMAL HIGH (ref 11.5–15.5)
WBC: 8.8 10*3/uL (ref 4.0–10.5)
nRBC: 0.7 % — ABNORMAL HIGH (ref 0.0–0.2)

## 2019-02-25 LAB — GI PATHOGEN PANEL BY PCR, STOOL

## 2019-02-25 MED ORDER — SODIUM CHLORIDE 0.9 % IV SOLN: INTRAVENOUS | Status: DC

## 2019-02-25 MED ORDER — DEXTROSE 5 % IV SOLN: INTRAVENOUS | Status: DC

## 2019-02-25 MED ORDER — PANTOPRAZOLE SODIUM 40 MG PO TBEC
40.0000 mg | DELAYED_RELEASE_TABLET | Freq: Two times a day (BID) | ORAL | Status: DC
  Administered 2019-02-25 – 2019-03-07 (×19): 40 mg via ORAL
  Filled 2019-02-25 (×19): qty 1

## 2019-02-25 NOTE — Progress Notes (Signed)
Jasmine Morrow visited pt. while rounding on ICU and observing pt.'s husband present @ bedside, as follow up from previous encounter last week and per RN report that pt. had been 'tearful and anxious' earlier today.  Pt. able to talk but appeared slightly confused.  Husband shared pt. would be staying here a few more days for further treatment.  No needs expressed, but pt. and husband are aware of [facility] availability.        02/25/19 1245  Clinical Encounter Type  Visited With Patient and family together  Visit Type Follow-up;Psychological support;Critical Care  Referral From Chaplain;Other (Comment) (RT)  Spiritual Encounters  Spiritual Needs Emotional  Stress Factors  Patient Stress Factors Health changes  Family Stress Factors Health changes

## 2019-02-25 NOTE — Progress Notes (Signed)
Pharmacy Electrolyte Monitoring Consult:  Pharmacy consulted to assist in monitoring and replacing electrolytes in this 74 y.o. female admitted on 02/20/2019 with Chest Pain   Labs:  Sodium (mmol/[place])  Date Value  02/25/2019 148 (H)   Potassium (mmol/[place])  Date Value  02/25/2019 3.6   Magnesium (mg/dL)  Date Value  02/24/2019 2.3   Phosphorus (mg/dL)  Date Value  02/24/2019 2.6   Calcium (mg/dL)  Date Value  02/25/2019 8.9   Albumin (g/dL)  Date Value  02/25/2019 2.2 ([place])    Assessment/Plan: Patient started on D5 at 58mL/hr during am rounds. Will order potassium 62mEq PO x 1 .  Will obtain BMP/Magnesium with am rounds.   Goal potassium ~ 4 and goal magnesium ~ 2.   Pharmacy will continue to monitor and adjust per consult.   Jasmine Morrow 02/25/2019 5:13 PM

## 2019-02-25 NOTE — Progress Notes (Signed)
Jasmine Lame, MD [facility]   [address], [place], [place] [address] Phone: [phone] Fax : [phone]   Subjective: The patient has had no further sign of any GI bleeding.  The patient has no abdominal pain at the present time.  It was believed that the patient had a diverticular bleed.  Hemoglobin yesterday was 7.4 with today being 7.5   Objective: Vital signs in last 24 hours: Vitals:   02/25/19 0700 02/25/19 0800 02/25/19 1000 02/25/19 1200  BP: 129/75 (!) 149/82 (!) 149/70 135/74  Pulse: 98 (!) 106 (!) 102 (!) 107  Resp: (!) 21   20  Temp:  98 F (36.7 C)    TempSrc:      SpO2: 100% 100% 100% 100%  Weight:       Weight change:   Intake/Output Summary (Last 24 hours) at 02/25/2019 1416 Last data filed at 02/25/2019 0800 Gross per 24 hour  Intake 12586.59 ml  Output 170 ml  Net 12416.59 ml     Exam: Heart:: Regular rate and rhythm, S1S2 present or without murmur or extra heart sounds Lungs: normal and clear to auscultation and percussion Abdomen: soft, nontender, normal bowel sounds   Lab Results: @LABTEST2 @ Micro Results: Recent Results (from the past 240 hour(s))  Respiratory Panel by RT PCR (Flu A&B, Covid) - Nasopharyngeal Swab     Status: None   Collection Time: 02/20/19  9:55 AM   Specimen: Nasopharyngeal Swab  Result Value Ref Range Status   SARS Coronavirus 2 by RT PCR NEGATIVE NEGATIVE Final    Comment: (NOTE) SARS-CoV-2 target nucleic acids are NOT DETECTED. The SARS-CoV-2 RNA is generally detectable in upper respiratoy specimens during the acute phase of infection. The lowest concentration of SARS-CoV-2 viral copies this assay can detect is 131 copies/mL. A negative result does not preclude SARS-Cov-2 infection and should not be used as the sole basis for treatment or other patient management decisions. A negative result may occur with  improper specimen collection/handling, submission of specimen other than nasopharyngeal swab,  presence of viral mutation(s) within the areas targeted by this assay, and inadequate number of viral copies (<131 copies/mL). A negative result must be combined with clinical observations, patient history, and epidemiological information. The expected result is Negative. Fact Sheet for Patients:  PinkCheek.be Fact Sheet for Healthcare Providers:  GravelBags.it This test is not yet ap proved or cleared by the [place] FDA and  has been authorized for detection and/or diagnosis of SARS-CoV-2 by FDA under an Emergency Use Authorization (EUA). This EUA will remain  in effect (meaning this test can be used) for the duration of the COVID-19 declaration under Section 564(b)(1) of the Act, 21 U.S.C. section 360bbb-3(b)(1), unless the authorization is terminated or revoked sooner.    Influenza A by PCR NEGATIVE NEGATIVE Final   Influenza B by PCR NEGATIVE NEGATIVE Final    Comment: (NOTE) The Xpert Xpress SARS-CoV-2/FLU/RSV assay is intended as an aid in  the diagnosis of influenza from Nasopharyngeal swab specimens and  should not be used as a sole basis for treatment. Nasal washings and  aspirates are unacceptable for Xpert Xpress SARS-CoV-2/FLU/RSV  testing. Fact Sheet for Patients: PinkCheek.be Fact Sheet for Healthcare Providers: GravelBags.it This test is not yet approved or cleared by the [place] FDA and  has been authorized for detection and/or diagnosis of SARS-CoV-2 by  FDA under an Emergency Use Authorization (EUA). This EUA will remain  in effect (meaning this test can be used) for the  duration of the  Covid-19 declaration under Section 564(b)(1) of the Act, 21  U.S.C. section 360bbb-3(b)(1), unless the authorization is  terminated or revoked. Performed at [facility], [address], [place], [place] [address]   Urine culture     Status:  Abnormal   Collection Time: 02/20/19 10:46 AM   Specimen: Urine, Clean Catch  Result Value Ref Range Status   Specimen Description   Final    URINE, CLEAN CATCH Performed at [facility], [address], [place], [place] [address]    Special Requests   Final    NONE Performed at [facility], [address], [place], [place] [address]    Culture (A)  Final    <10,000 COLONIES/mL INSIGNIFICANT GROWTH Performed at [facility] [address], [place], [place] [address]    Report Status 02/22/2019 FINAL  Final  Blood culture (routine x 2)     Status: Abnormal   Collection Time: 02/20/19 12:04 PM   Specimen: BLOOD  Result Value Ref Range Status   Specimen Description   Final    BLOOD BLOOD RIGHT HAND Performed at [facility], [address] [place], [place] [address]    Special Requests   Final    BOTTLES DRAWN AEROBIC AND ANAEROBIC Blood Culture adequate volume Performed at [facility], [address] [place], [place] [address]    Culture  Setup Time   Final    GRAM POSITIVE COCCI IN BOTH AEROBIC AND ANAEROBIC BOTTLES CRITICAL RESULT CALLED TO, READ BACK BY AND VERIFIED WITH: SCOTT HALL AT [id number] 02/21/19 SNG  Performed at [facility], [address] [place], [place] [address]    Culture STAPHYLOCOCCUS AUREUS (A)  Final   Report Status 02/24/2019 FINAL  Final   Organism ID, Bacteria STAPHYLOCOCCUS AUREUS  Final      Susceptibility   Staphylococcus aureus - MIC*    CIPROFLOXACIN <=0.5 SENSITIVE Sensitive     ERYTHROMYCIN >=8 RESISTANT Resistant     GENTAMICIN <=0.5 SENSITIVE Sensitive     OXACILLIN 0.5 SENSITIVE Sensitive     TETRACYCLINE <=1 SENSITIVE Sensitive     VANCOMYCIN 1 SENSITIVE Sensitive     TRIMETH/SULFA <=10 SENSITIVE Sensitive     CLINDAMYCIN RESISTANT Resistant     RIFAMPIN <=0.5 SENSITIVE Sensitive     Inducible Clindamycin POSITIVE Resistant     * STAPHYLOCOCCUS AUREUS  Blood culture (routine x  2)     Status: Abnormal   Collection Time: 02/20/19 12:33 PM   Specimen: BLOOD  Result Value Ref Range Status   Specimen Description   Final    BLOOD RIGHT ANTECUBITAL Performed at [facility], [address] [place], [place] [address]    Special Requests   Final    BOTTLES DRAWN AEROBIC AND ANAEROBIC Blood Culture adequate volume Performed at [facility], [address] [place], [place] [address]    Culture  Setup Time   Final    GRAM POSITIVE COCCI IN BOTH AEROBIC AND ANAEROBIC BOTTLES CRITICAL RESULT CALLED TO, READ BACK BY AND VERIFIED WITH: Rito Ehrlich AT [id number] 02/21/19 SDR Performed at [facility], [address] [place], [place] [address]    Culture (A)  Final    STAPHYLOCOCCUS AUREUS SUSCEPTIBILITIES PERFORMED ON PREVIOUS CULTURE WITHIN THE LAST 5 DAYS. Performed at [facility], [place] [address] [place],  [address]    Report Status 02/23/2019 FINAL  Final  Blood Culture ID Panel (Reflexed)  Status: Abnormal   Collection Time: 02/20/19 12:33 PM  Result Value Ref Range Status   Enterococcus species NOT DETECTED NOT DETECTED Final   Listeria monocytogenes NOT DETECTED NOT DETECTED Final   Staphylococcus species DETECTED (A) NOT DETECTED Final    Comment: CRITICAL RESULT CALLED TO, READ BACK BY AND VERIFIED WITH: Rito Ehrlich AT [id number] 02/21/19 SDR    Staphylococcus aureus (BCID) DETECTED (A) NOT DETECTED Final    Comment: Methicillin (oxacillin) susceptible Staphylococcus aureus (MSSA). Preferred therapy is anti staphylococcal beta lactam antibiotic (Cefazolin or Nafcillin), unless clinically contraindicated. CRITICAL RESULT CALLED TO, READ BACK BY AND VERIFIED WITH: Rito Ehrlich AT [id number] 02/21/19 SDR    Methicillin resistance NOT DETECTED NOT DETECTED Final   Streptococcus species NOT DETECTED NOT DETECTED Final   Streptococcus agalactiae NOT DETECTED NOT DETECTED Final   Streptococcus pneumoniae NOT DETECTED NOT DETECTED  Final   Streptococcus pyogenes NOT DETECTED NOT DETECTED Final   Acinetobacter baumannii NOT DETECTED NOT DETECTED Final   Enterobacteriaceae species NOT DETECTED NOT DETECTED Final   Enterobacter cloacae complex NOT DETECTED NOT DETECTED Final   Escherichia coli NOT DETECTED NOT DETECTED Final   Klebsiella oxytoca NOT DETECTED NOT DETECTED Final   Klebsiella pneumoniae NOT DETECTED NOT DETECTED Final   Proteus species NOT DETECTED NOT DETECTED Final   Serratia marcescens NOT DETECTED NOT DETECTED Final   Haemophilus influenzae NOT DETECTED NOT DETECTED Final   Neisseria meningitidis NOT DETECTED NOT DETECTED Final   Pseudomonas aeruginosa NOT DETECTED NOT DETECTED Final   Candida albicans NOT DETECTED NOT DETECTED Final   Candida glabrata NOT DETECTED NOT DETECTED Final   Candida krusei NOT DETECTED NOT DETECTED Final   Candida parapsilosis NOT DETECTED NOT DETECTED Final   Candida tropicalis NOT DETECTED NOT DETECTED Final    Comment: Performed at [facility], [address] [place], [place] [address]  C difficile quick scan w PCR reflex     Status: Abnormal   Collection Time: 02/20/19  6:16 PM   Specimen: STOOL  Result Value Ref Range Status   C Diff antigen POSITIVE (A) NEGATIVE Final   C Diff toxin NEGATIVE NEGATIVE Final   C Diff interpretation Results are indeterminate. See PCR results.  Final    Comment: Performed at [facility], [address] [place], [place] [address]  C. Diff by PCR, Reflexed     Status: None   Collection Time: 02/20/19  6:16 PM  Result Value Ref Range Status   Toxigenic C. Difficile by PCR NEGATIVE NEGATIVE Final    Comment: Patient is colonized with non toxigenic C. difficile. May not need treatment unless significant symptoms are present. Performed at [facility], [address] [place], [place] [address]   GI pathogen panel by PCR, stool     Status: Abnormal   Collection Time: 02/21/19  4:04 PM   Specimen: Stool    Result Value Ref Range Status   Plesiomonas shigelloides NOT DETECTED NOT DETECTED Final   Yersinia enterocolitica NOT DETECTED NOT DETECTED Final   Vibrio NOT DETECTED NOT DETECTED Final   Enteropathogenic E coli DETECTED (A) NOT DETECTED Final   E coli (ETEC) LT/ST NOT DETECTED NOT DETECTED Final   E coli [id number] by PCR Not applicable NOT DETECTED Final   Cryptosporidium by PCR NOT DETECTED NOT DETECTED Final   Entamoeba histolytica NOT DETECTED NOT DETECTED Final   Adenovirus F 40/41 NOT DETECTED NOT DETECTED Final   Norovirus GI/GII NOT DETECTED NOT DETECTED Final  Sapovirus NOT DETECTED NOT DETECTED Final    Comment: (NOTE) Performed At: [facility] [place], [place] [id number] Rush Farmer MD [id number]    Vibrio cholerae NOT DETECTED NOT DETECTED Final   Campylobacter by PCR NOT DETECTED NOT DETECTED Final   Salmonella by PCR NOT DETECTED NOT DETECTED Final   E coli (STEC) NOT DETECTED NOT DETECTED Final   Enteroaggregative E coli NOT DETECTED NOT DETECTED Final   Shigella by PCR NOT DETECTED NOT DETECTED Final   Cyclospora cayetanensis NOT DETECTED NOT DETECTED Final   Astrovirus NOT DETECTED NOT DETECTED Final   G lamblia by PCR NOT DETECTED NOT DETECTED Final   Rotavirus A by PCR NOT DETECTED NOT DETECTED Final  CULTURE, BLOOD (ROUTINE X 2) w Reflex to ID Panel     Status: None (Preliminary result)   Collection Time: 02/22/19 12:59 AM   Specimen: BLOOD  Result Value Ref Range Status   Specimen Description BLOOD RIGHT ANTECUBITAL  Final   Special Requests   Final    BOTTLES DRAWN AEROBIC ONLY Blood Culture adequate volume   Culture   Final    NO GROWTH 3 DAYS Performed at [facility], [address], [place], [place] [address]    Report Status PENDING  Incomplete  CULTURE, BLOOD (ROUTINE X 2) w Reflex to ID Panel     Status: None (Preliminary result)   Collection Time: 02/22/19 12:59 AM   Specimen: BLOOD  Result Value Ref Range  Status   Specimen Description BLOOD BLOOD RIGHT HAND  Final   Special Requests   Final    BOTTLES DRAWN AEROBIC AND ANAEROBIC Blood Culture adequate volume   Culture   Final    NO GROWTH 3 DAYS Performed at [facility], [address] [place], [place] [address]    Report Status PENDING  Incomplete  Aerobic Culture (superficial specimen)     Status: None (Preliminary result)   Collection Time: 02/24/19  2:00 PM   Specimen: Wound  Result Value Ref Range Status   Specimen Description   Final    WOUND Performed at [facility], [address] [place], [place] [address]    Special Requests   Final    NONE Performed at [facility], [address] [place], [place] [address]    Gram Stain   Final    RARE WBC PRESENT, PREDOMINANTLY PMN NO ORGANISMS SEEN    Culture   Final    NO GROWTH < 24 HOURS Performed at [facility], [place] [address], [place],  [address]    Report Status PENDING  Incomplete   Studies/Results: No results found. Medications: I have reviewed the patient's current medications. Scheduled Meds: . calcium-vitamin D  2 tablet Oral Daily  . chlorhexidine  15 mL Mouth Rinse BID  . Chlorhexidine Gluconate Cloth  6 each Topical Daily  . loratadine  10 mg Oral Daily  . mouth rinse  15 mL Mouth Rinse q12n4p  . pantoprazole  40 mg Oral BID AC   Continuous Infusions: . sodium chloride    .  ceFAZolin (ANCEF) IV 2 g (02/25/19 0547)  . dextrose 50 mL/hr at 02/25/19 1129   PRN Meds:.acetaminophen, bisacodyl, hydroxypropyl methylcellulose / hypromellose, ondansetron   Assessment: Active Problems:   Symptomatic anemia   SOB (shortness of breath)   Gastrointestinal hemorrhage   Sepsis ([place])    Plan: The patient was found to have diverticulosis with some blood on the colonoscopy  and thought to have a diverticular bleed.  The patient has had no further bleeding and her hemoglobin is stable.  She is going for TEE tomorrow.   I had a long discussion with the patient about her possible cause of bleeding and that her hemoglobin is stable.  Nothing further to do from a GI point of view at this time.  I will sign off.  Please call if any further GI concerns or questions.  We would like to thank you for the opportunity to participate in the care of Jasmine Morrow.     LOS: 5 days   Jasmine Morrow 02/25/2019, 2:16 PM Pager [phone] 7am-5pm  Check AMION for 5pm -7am coverage and on weekends

## 2019-02-25 NOTE — Consult Note (Signed)
Pharmacy Antibiotic Note  Jasmine Morrow is a 74 y.o. female admitted on 02/20/2019 with sepsis of unknown source. Blood cultures with MSSA. Plan to evaluate surgical site (s/p right ankle fracture with ORIF). Pharmacy has been consulted for cefazolin dosing. ID following.    Plan: Day 7 of abx. Will continue with Cefazolin 2 g IV q8h. Repeat blood cultures remain negative. TEE Pending.    Weight: 290 lb 9.1 oz (131.8 kg)  Temp (24hrs), Avg:97.9 F (36.6 C), Min:97.7 F (36.5 C), Max:98.3 F (36.8 C)  Recent Labs  Lab 02/20/19 0955 02/20/19 1159 02/20/19 1609 02/21/19 0138 02/21/19 0138 02/21/19 0848 02/22/19 0100 02/23/19 0540 02/24/19 0300 02/25/19 0411  WBC   < >  --   --  34.9*   < > 35.2* 25.9* 21.3* 9.9 8.8  CREATININE   < >  --   --  1.71*   < > 1.88* 1.73* 1.40* 1.01* 0.91  LATICACIDVEN  --  2.4* 5.1* 1.1  --   --   --   --   --   --   VANCORANDOM  --   --   --  29  --   --   --   --   --   --    < > = values in this interval not displayed.    Estimated Creatinine Clearance: 80.4 mL/min (by C-G formula based on SCr of 0.91 mg/dL).    Allergies  Allergen Reactions  . Celecoxib Nausea Only  . Hydrocodone-Acetaminophen Nausea And Vomiting  . Meloxicam Nausea Only  . Morphine Sulfate Er Beads Itching  . Naproxen Swelling  . Rofecoxib Nausea Only  . Sulfa Antibiotics Other (See Comments)    Reaction: unknown  . Ciprofloxacin Rash  . Nickel Rash  . Tramadol-Acetaminophen Nausea Only and Rash    Antimicrobials this admission: Vancomycin 1/20 >> 1/21 Cefepime 1/20 >> 1/21 Cefazolin 1/21 >>   Microbiology results: 1/20 BCx: MSSA 1/20 UCx: insignificant growth 1/22 Bcx: pending 1/21 GI Panel: Ecoli   Thank you for allowing pharmacy to be a part of this patient's care.  Frady Taddeo L 02/25/2019 5:13 PM

## 2019-02-25 NOTE — Progress Notes (Signed)
Patient was confused this am, better now.  Dressing dry. Plan to cast when PT can be started.   Will need further surgery on medial malleolus when skin is adequately healed on anterior/medial ankle and medically stable, expect at least 2 weeks for that.

## 2019-02-25 NOTE — Progress Notes (Signed)
Patient ID: Jasmine Morrow, female   DOB: 1945-03-06, 74 y.o.   MRN: [id number] transfer from ICU. [facility] hospitalist will start seeing patient from 26 January.  Spoke with Dr. Mortimer Fries  [name] comes from [facility] for chest pain. She was found to have hemoglobin of 5.6 on admission. She was admitted in the ICU with   Hemorrhagic shock/acute blood loss anemia/melena received couple  units of blood transfusion. G.I. was consulted. EGD showed non-bleeding gastric ulcer colonoscopy showed blood in the entire colon and sigmoid area-- presumed diverticular bleed.  Sepsis-MSSA uremia with positive blood cultures now on cefazolin followed by ID  Acute injury injury secondary to shock improving with IV hydration  Right ankle fracture status post recent ORIF--followed by Dr Rudene Christians  Pt is Full code

## 2019-02-25 NOTE — Progress Notes (Signed)
ID Says she is feeling better Had some thing to eat today No fever   BP (!) 144/79   Pulse (!) 104   Temp 98.9 F (37.2 C) (Oral)   Resp (!) 7   Wt 131.8 kg   SpO2 98%   BMI 44.18 kg/m    Awake and alert Oriented in person, year, month  Chest b/l air entry HSs 1s2 And soft Rectal tube has dark blood Rt leg dressing not removed  CBC Latest Ref Rng & Units 02/25/2019 02/24/2019 02/23/2019  WBC 4.0 - 10.5 K/uL 8.8 9.9 -  Hemoglobin 12.0 - 15.0 g/dL 7.5(L) 7.4(L) 8.1(L)  Hematocrit 36.0 - 46.0 % 24.8(L) 23.7(L) 25.4(L)  Platelets 150 - 400 K/uL 303 345 -     CMP Latest Ref Rng & Units 02/25/2019 02/24/2019 02/24/2019  Glucose 70 - 99 mg/dL 97 - 96  BUN 8 - 23 mg/dL 35(H) - 44(H)  Creatinine 0.44 - 1.00 mg/dL 0.91 - 1.01(H)  Sodium 135 - 145 mmol/L 148(H) - 146(H)  Potassium 3.5 - 5.1 mmol/L 3.6 3.4(L) 2.7(LL)  Chloride 98 - 111 mmol/L 120(H) - 115(H)  CO2 22 - 32 mmol/L 21(L) - 23  Calcium 8.9 - 10.3 mg/dL 8.9 - 8.7(L)  Total Protein 6.5 - 8.1 g/dL 5.1(L) - -  Total Bilirubin 0.3 - 1.2 mg/dL 0.8 - -  Alkaline Phos 38 - 126 U/L 84 - -  AST 15 - 41 U/L 28 - -  ALT 0 - 44 U/L 6 - -     Blood culture 02/20/19 MSSA 4/4  02/22/19 - NG  02/24/19 Wound culture  Impression   MSSA bacteremia- source could be the superficial wound on the front of the rt leg The surgical site looks fine CT of the leg has no abscess or osteo Culture of the wound sent TEE to r/o endocarditis Repeat culture of blood neg Continue cefazolin  ORIF- rt ankle for fracture Has hardware  Concern for infection but CT no evidence   Severe acute anemia due to GI bleed GI bleed had colonoscopy and upper gig As per Dr.toledo this is diverticular bleeding  Leukemoid reaction- resolved Severe thrombocytosis - resolved   AKI- resolved  Discussed the management with patient

## 2019-02-25 NOTE — Consult Note (Signed)
[facility] Consultation Note  Patient ID: Jasmine Morrow, MRN: [id number], DOB/AGE: 06-19-1945 74 y.o. Admit date: 02/20/2019   Date of Consult: 02/25/2019 Primary Physician: Derinda Late, MD Primary Cardiologist: None  Chief Complaint:  Chief Complaint  Patient presents with  . Chest Pain   Reason for Consult: Possible endocarditis  HPI: 74 y.o. female with apparent concerns of peripheral vascular disease with significant new concerns of lower leg cellulitis ulcers who is coming in with acute onset of bacteremia and significan manifestations.  The patient has had broad-spectrum antibiotics with improvements of symptoms but has had MRSA bacteremia.  There has been concerns that this bacteremia may or may have affected the cardiovascular system and or caused endocarditis.  Currently there is no evidence of significant heart murmurs or other changes concerning for this but will need further evaluation including transesophageal echocardiogram.  Patient understands all the risks and benefits of cardiac evaluation  Past Medical History:  Diagnosis Date  . Hypertension   . Seasonal allergies       Surgical History:  Past Surgical History:  Procedure Laterality Date  . CESAREAN SECTION    . DILATION AND CURETTAGE OF UTERUS    . ESOPHAGOGASTRODUODENOSCOPY N/A 02/22/2019   Procedure: ESOPHAGOGASTRODUODENOSCOPY (EGD);  Surgeon: Toledo, Benay Pike, MD;  Location: [facility] ENDOSCOPY;  Service: Gastroenterology;  Laterality: N/A;  . FLEXIBLE SIGMOIDOSCOPY N/A 02/22/2019   Procedure: FLEXIBLE SIGMOIDOSCOPY;  Surgeon: Toledo, Benay Pike, MD;  Location: [facility] ENDOSCOPY;  Service: Gastroenterology;  Laterality: N/A;  . ORIF ANKLE FRACTURE Right 02/08/2019   Procedure: OPEN REDUCTION INTERNAL FIXATION (ORIF) ANKLE FRACTURE, post malleolus;  Surgeon: Hessie Knows, MD;  Location: [facility] ORS;  Service: Orthopedics;  Laterality: Right;  . REPLACEMENT TOTAL KNEE Bilateral 2008   2009  .  SYNDESMOSIS REPAIR Right 02/08/2019   Procedure: SYNDESMOSIS REPAIR;  Surgeon: Hessie Knows, MD;  Location: [facility] ORS;  Service: Orthopedics;  Laterality: Right;  . TUMOR REMOVAL       Home Meds: Prior to Admission medications   Medication Sig Start Date End Date Taking? Authorizing Provider  acetaminophen (TYLENOL) 500 MG tablet Take 1,000 mg by mouth every 6 (six) hours as needed for mild pain or headache.    Yes [provider]  albuterol (PROVENTIL HFA;VENTOLIN HFA) 108 (90 Base) MCG/ACT inhaler Inhale 2 puffs into the lungs every 4 (four) hours as needed. Patient taking differently: Inhale 2 puffs into the lungs every 4 (four) hours as needed for wheezing or shortness of breath.  05/09/16  Yes Wilhelmina Mcardle, MD  aspirin EC 325 MG tablet Take 1 tablet (325 mg total) by mouth daily. Patient taking differently: Take 325 mg by mouth 2 (two) times daily.  02/13/19  Yes Duanne Guess, PA-C  bisacodyl (DULCOLAX) 10 MG suppository Place 1 suppository (10 mg total) rectally daily as needed for moderate constipation. 02/10/19  Yes Duanne Guess, PA-C  Calcium-Vitamin D 600-200 MG-UNIT per tablet Take 2 tablets by mouth daily.    Yes [provider]  carvedilol (COREG) 3.125 MG tablet Take 3.125 mg by mouth 2 (two) times daily. 12/07/18  Yes [provider]  cetirizine (ZYRTEC) 5 MG tablet Take 10 mg by mouth daily.    Yes [provider]  cyclobenzaprine (FLEXERIL) 10 MG tablet Take 10 mg by mouth daily as needed for muscle spasms.   Yes [provider]  fluocinonide cream (LIDEX) [id number] % Apply 1 application topically 3 (three) times daily as needed (mouth sores). Apply  to mouth lesions as needed 01/17/19  Yes [provider]  hydroxypropyl methylcellulose / hypromellose (ISOPTO TEARS / GONIOVISC) 2.5 % ophthalmic solution Place 1 drop into both eyes every 8 (eight) hours as needed for dry eyes.    Yes [provider]  Multiple Vitamin  (MULTIVITAMIN WITH MINERALS) TABS tablet Take 1 tablet by mouth daily.   Yes [provider]  Multiple Vitamins-Minerals (PRESERVISION AREDS 2 PO) Take 1 tablet by mouth 2 (two) times daily.   Yes [provider]  omeprazole (PRILOSEC) 20 MG capsule Take 20 mg by mouth 2 (two) times daily as needed (Heartburn).  04/23/15  Yes [provider]  ondansetron (ZOFRAN) 8 MG tablet Take 1 tablet (8 mg total) by mouth every 8 (eight) hours as needed for nausea or vomiting. 02/06/19  Yes Sable Feil, PA-C  traMADol (ULTRAM) 50 MG tablet Take 1-2 tablets (50-100 mg total) by mouth every 6 (six) hours as needed for severe pain. 02/10/19  Yes Duanne Guess, PA-C  triamterene-hydrochlorothiazide (MAXZIDE-25) 37.5-25 MG tablet Take 1 tablet by mouth daily. 03/21/18  Yes [provider]    Inpatient Medications:  . calcium-vitamin D  2 tablet Oral Daily  . chlorhexidine  15 mL Mouth Rinse BID  . Chlorhexidine Gluconate Cloth  6 each Topical Daily  . loratadine  10 mg Oral Daily  . mouth rinse  15 mL Mouth Rinse q12n4p  . pantoprazole  40 mg Oral BID AC   .  ceFAZolin (ANCEF) IV 2 g (02/25/19 1424)  . dextrose 50 mL/hr at 02/25/19 1600    Allergies:  Allergies  Allergen Reactions  . Celecoxib Nausea Only  . Hydrocodone-Acetaminophen Nausea And Vomiting  . Meloxicam Nausea Only  . Morphine Sulfate Er Beads Itching  . Naproxen Swelling  . Rofecoxib Nausea Only  . Sulfa Antibiotics Other (See Comments)    Reaction: unknown  . Ciprofloxacin Rash  . Nickel Rash  . Tramadol-Acetaminophen Nausea Only and Rash    Social History   Socioeconomic History  . Marital status: Married    Spouse name: Not on file  . Number of children: Not on file  . Years of education: Not on file  . Highest education level: Not on file  Occupational History  . Not on file  Tobacco Use  . Smoking status: Former Smoker    Packs/day: 2.00    Years: 30.00    Pack years: 60.00     Types: Cigarettes    Quit date: 10/02/1990    Years since quitting: 28.4  . Smokeless tobacco: Never Used  Substance and Sexual Activity  . Alcohol use: No    Alcohol/week: 0.0 standard drinks  . Drug use: No  . Sexual activity: Not on file  Other Topics Concern  . Not on file  Social History Narrative  . Not on file   Social Determinants of Health   Financial Resource Strain:   . Difficulty of Paying Living Expenses: Not on file  Food Insecurity:   . Worried About Charity fundraiser in the Last Year: Not on file  . Ran Out of Food in the Last Year: Not on file  Transportation Needs:   . Lack of Transportation (Medical): Not on file  . Lack of Transportation (Non-Medical): Not on file  Physical Activity:   . Days of Exercise per Week: Not on file  . Minutes of Exercise per Session: Not on file  Stress:   . Feeling of Stress :  Not on file  Social Connections:   . Frequency of Communication with Friends and Family: Not on file  . Frequency of Social Gatherings with Friends and Family: Not on file  . Attends Religious Services: Not on file  . Active Member of Clubs or Organizations: Not on file  . Attends Archivist Meetings: Not on file  . Marital Status: Not on file  Intimate Partner Violence:   . Fear of Current or Ex-Partner: Not on file  . Emotionally Abused: Not on file  . Physically Abused: Not on file  . Sexually Abused: Not on file     Family History  Problem Relation Age of Onset  . Bone cancer Mother   . COPD Father      Review of Systems Positive for none Negative for: General:  chills, fever, night sweats or weight changes.  Cardiovascular: PND orthopnea syncope dizziness  Dermatological skin lesions rashes Respiratory: Cough congestion Urologic: Frequent urination urination at night and hematuria Abdominal: negative for nausea, vomiting, diarrhea, bright red blood per rectum, melena, or hematemesis Neurologic: negative for visual changes,  and/or hearing changes  All other systems reviewed and are otherwise negative except as noted above.  Labs: No results for input(s): CKTOTAL, CKMB, TROPONINI in the last 72 hours. Lab Results  Component Value Date   WBC 8.8 02/25/2019   HGB 7.5 (L) 02/25/2019   HCT 24.8 (L) 02/25/2019   MCV 92.5 02/25/2019   PLT 303 02/25/2019    Recent Labs  Lab 02/25/19 0411  NA 148*  K 3.6  CL 120*  CO2 21*  BUN 35*  CREATININE 0.91  CALCIUM 8.9  PROT 5.1*  BILITOT 0.8  ALKPHOS 84  ALT 6  AST 28  GLUCOSE 97   Lab Results  Component Value Date   TRIG 228 (H) 02/22/2019   No results found for: DDIMER  Radiology/Studies:  CT ABDOMEN PELVIS WO CONTRAST  Result Date: 02/20/2019 CLINICAL DATA:  Abdomen pain fever postop EXAM: CT ABDOMEN AND PELVIS WITHOUT CONTRAST TECHNIQUE: Multidetector CT imaging of the abdomen and pelvis was performed following the standard protocol without IV contrast. COMPARISON:  Chest x-ray 02/20/2019 FINDINGS: Lower chest: Lung bases demonstrate no acute consolidation or effusion. The heart size is within normal limits. Hepatobiliary: Gallbladder is not clearly identified and is presumably surgically absent. Extrahepatic common bile duct slightly enlarged at 1 cm. No focal hepatic abnormality Pancreas: Unremarkable. No pancreatic ductal dilatation or surrounding inflammatory changes. Spleen: Normal in size without focal abnormality. Adrenals/Urinary Tract: Adrenal glands are normal. No hydronephrosis. Low-attenuation lesions in the kidneys likely cysts. No ureteral stone. Distended urinary bladder Stomach/Bowel: Radiopaque material in the stomach. No dilated small bowel. No bowel wall thickening. Sigmoid colon diverticula without acute inflammatory change. Appendix not well seen but no right lower quadrant inflammatory process. Vascular/Lymphatic: Nonaneurysmal aorta with mild aortic atherosclerosis. No significantly enlarged lymph nodes Reproductive: Small uterine  calcifications, possible fibroids. No adnexal mass. Other: Negative for free air or free fluid. Musculoskeletal: Grade 1 anterolisthesis L4 on L5. Degenerative changes of the spine. No acute osseous abnormality. IMPRESSION: 1. No CT evidence for acute intra-abdominal or intrapelvic abnormality. 2. Sigmoid colon diverticular disease without acute inflammatory change. 3. Nonvisualized gallbladder presumably due to prior cholecystectomy. Prominent extrahepatic bile duct which may be secondary to postsurgical change though suggest correlation with LFTs. Electronically Signed   By: Donavan Foil M.D.   On: 02/20/2019 17:23   DG Chest 2 View  Result Date: 02/12/2019 CLINICAL DATA:  Cough. EXAM: CHEST - 2 VIEW COMPARISON:  02/07/2019. FINDINGS: Mediastinum hilar structures normal. Heart size stable. Mild bibasilar subsegmental atelectasis and or scarring. Mild left base infiltrate cannot be excluded. No pleural effusion or pneumothorax. IMPRESSION: Mild bibasilar subsegmental atelectasis and or scarring. Mild left base infiltrate cannot be excluded. Electronically Signed   By: Marcello Moores  Register   On: 02/12/2019 10:15   DG Thoracic Spine 2 View  Result Date: 02/11/2019 CLINICAL DATA:  Back pain EXAM: THORACIC SPINE 2 VIEWS COMPARISON:  None. FINDINGS: Vertebral body heights and alignment are maintained. Mild degenerative changes are present. Included lungs are clear. Normal heart size. IMPRESSION: Mild degenerative changes.  No compression deformity identified. Electronically Signed   By: Macy Mis M.D.   On: 02/11/2019 08:36   DG Lumbar Spine 2-3 Views  Result Date: 02/06/2019 CLINICAL DATA:  Low back pain and right hip pain secondary to a fall in her yard this morning. EXAM: LUMBAR SPINE - 2-3 VIEW COMPARISON:  None. FINDINGS: There is no fracture or bone destruction. Grade 1 spondylolisthesis of L4 on L5 and of L5 on S1 felt to be secondary to severe bilateral facet arthritis. Chronic degenerative disc  disease at L1-2 and L2-3 with disc space narrowing and posterior osteophytes protruding into the spinal canal at each of those levels. Minimal lumbar scoliosis with convexity to the right centered at L2-3. IMPRESSION: 1. No acute abnormality. 2. 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 3. Severe degenerative disc disease at L1-2 and L2-3. Electronically Signed   By: Lorriane Shire M.D.   On: 02/06/2019 12:39   DG Ankle 2 Views Right  Result Date: 02/08/2019 CLINICAL DATA:  ORIF right ankle fracture EXAM: DG C-ARM 1-60 MIN; RIGHT ANKLE - 2 VIEW COMPARISON:  02/06/2019 right ankle radiographs FLUOROSCOPY TIME:  Fluoroscopy Time:  1 minutes 45 seconds Number of Acquired Spot Images: 4 FINDINGS: Nondiagnostic spot fluoroscopic intraoperative right ankle radiographs demonstrate transfixation of posterior malleolar fracture with surgical plate and multiple interlocking screws. IMPRESSION: Intraoperative fluoroscopic guidance for ORIF right ankle fracture. Electronically Signed   By: Ilona Sorrel M.D.   On: 02/08/2019 15:30   DG Ankle Complete Right  Result Date: 02/06/2019 CLINICAL DATA:  Right ankle pain secondary to a fall in the patient's yard. EXAM: RIGHT ANKLE - COMPLETE 3+ VIEW COMPARISON:  None. FINDINGS: There is a fracture dislocation at the right ankle. There are displaced fractures of the medial and lateral malleoli. There is disruption of the distal tibiofibular syndesmosis with dislocation of the foot posteriorly with respect to the tibia. Minimal osteophyte formation on the dorsum of the midfoot. IMPRESSION: 1. Fracture dislocation of the right ankle. 2. Medial and lateral malleolar fractures with disruption of the distal tibiofibular syndesmosis. Electronically Signed   By: Lorriane Shire M.D.   On: 02/06/2019 10:16   CT LUMBAR SPINE WO CONTRAST  Result Date: 02/22/2019 CLINICAL DATA:  Low back and right hip pain since recent fall on  02/06/2019. Bacteremia with suspected infection/inflammation. EXAM: CT LUMBAR SPINE WITHOUT CONTRAST TECHNIQUE: Multidetector CT imaging of the lumbar spine was performed without intravenous contrast administration. Multiplanar CT image reconstructions were also generated. COMPARISON:  Abdominopelvic CT 02/20/2019, chest CT 08/17/2015 and lumbar spine radiographs 02/06/2019. FINDINGS: Segmentation: There are 5 lumbar type vertebral bodies as correlated with previous imaging. Alignment: Stable grade 1 degenerative anterolisthesis at L4-5 and L5-S1. Mild convex right scoliosis. Vertebrae: No evidence of acute fracture or pars defect. No evidence  osteomyelitis. There is multilevel spondylosis with endplate osteophytes and facet hypertrophy. Paraspinal and other soft tissues: No paraspinal inflammatory changes or fluid collections. Diffuse aortic and branch vessel atherosclerosis. Exophytic 4 cm cyst from the lower pole of the right kidney. There are additional low-density retroperitoneal masses on the right, lateral to the psoas muscle, measuring up to 3.2 cm on image 111/6. 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 Disc levels: There is mild disc degeneration and endplate osteophyte formation in the thoracic spine extending from T9-10 through T12-L1. No resulting spinal stenosis or nerve root encroachment. L1-2: Loss of disc height with annular disc bulging and endplate osteophytes. No significant spinal stenosis or nerve root encroachment. L2-3: Advanced loss of disc height with vacuum phenomenon, annular disc bulging and endplate osteophytes. Mild facet and ligamentous hypertrophy. These factors contribute to mild spinal stenosis and mild left-greater-than-right foraminal narrowing. L3-4: Relatively preserved disc height with mild disc bulging and endplate osteophyte formation asymmetric to the left. Moderate facet and ligamentous hypertrophy. These factors  contribute to mild spinal stenosis with mild asymmetric narrowing of the lateral recess and foramen on the left. L4-5: Mild loss of disc height with mild annular disc bulging. Advanced bilateral facet hypertrophy, accounting for the grade 1 anterolisthesis. Resulting mild-to-moderate spinal stenosis with narrowing of both lateral recesses and asymmetric right foraminal narrowing. L5-S1: Advanced bilateral facet hypertrophy accounting for the grade 1 anterolisthesis. Advanced degenerative disc disease with loss of disc height, vacuum phenomenon and endplate osteophytes asymmetric to the right. Moderate foraminal narrowing bilaterally. IMPRESSION: 1. No CT evidence of spinal or paraspinal infection. 2. Multilevel spondylosis, similar to recent abdominal CT. Disc and endplate degeneration are most advanced at the L2-3 and L5-S1 levels. There are advanced facet degenerative changes bilaterally at L4-5 and L5-S1. Resulting spinal and foraminal stenosis as detailed above; the right foraminal narrowing is worst at L4-5. 3. Lobulated low-density retroperitoneal mass on the right has a nonaggressive appearance and may reflect a lymphangioma, although is incompletely evaluated by this noncontrast study. This is lateral to the psoas muscle and unrelated to the spine. Electronically Signed   By: Richardean Sale M.D.   On: 02/22/2019 15:33   NM GI Blood Loss  Result Date: 02/22/2019 CLINICAL DATA:  Bright red blood per rectum. History of black tarry stools. EXAM: NUCLEAR MEDICINE GASTROINTESTINAL BLEEDING SCAN TECHNIQUE: Sequential abdominal images were obtained following intravenous administration of Tc-56m labeled red blood cells. RADIOPHARMACEUTICALS:  19.4 mCi Tc-30m pertechnetate in-vitro labeled red cells. COMPARISON:  CT 02/20/2019. FINDINGS: Following administration radiopharmaceutical standard on static images obtained. No evidence of GI bleed noted. IMPRESSION: No evidence of GI bleed. Electronically Signed   By:  Marcello Moores  Register   On: 02/22/2019 15:07   CT ANKLE RIGHT WO CONTRAST  Result Date: 02/22/2019 CLINICAL DATA:  The patient suffered right ankle fractures in a trip and fall 02/06/2019. She underwent fixation 02/08/2019. The patient was then admitted to the hospital 02/20/2019 with chest discomfort and oxygen desaturation, elevated white blood cell count and possible sepsis. Subsequent encounter. EXAM: CT OF THE RIGHT ANKLE WITHOUT CONTRAST TECHNIQUE: Multidetector CT imaging of the right ankle was performed according to the standard protocol. Multiplanar CT image reconstructions were also generated. COMPARISON:  CT and plain films right ankle 02/06/2019. Plain films right ankle 02/08/2019. FINDINGS: Bones/Joint/Cartilage The patient is status post plate and screw fixation of a posterior malleolar fracture and repair of the syndesmosis with wires and plate along the distal fibula. Hardware is  intact without evidence of loosening or infection. No bony destructive change or periosteal reaction is identified. Inferiorly displaced transverse fracture through the base of the medial malleolus is unchanged. There is also a fracture of the anterior corner of the distal tibia on the lateral side where a fracture fragment measuring 1 cm in diameter is laterally rotated and displaced, unchanged. Lateral tilt and posterior subluxation of the talus at the tibiotalar joint have been reduced. Posterior malleolar fracture is in near anatomic position and alignment. There is mild widening of the medial clear space. Tiny subchondral cyst in both the medial and lateral talar dome are again seen. Mild midfoot osteoarthritis is noted. There is a very small plantar calcaneal spur. Ligaments Suboptimally assessed by CT. Muscles and Tendons Intact and normal in appearance. No evidence of tenosynovitis or tendon entrapment. Soft tissues No joint effusion. Mild soft tissue swelling is seen about the ankle. No focal fluid collection or soft  tissue gas. IMPRESSION: Negative for evidence of osteomyelitis.  No abscess is identified. Postoperative change as described above. Position and alignment of the patient's fractures are improved. Electronically Signed   By: Inge Rise M.D.   On: 02/22/2019 13:48   CT Ankle Right Wo Contrast  Result Date: 02/06/2019 CLINICAL DATA:  Ankle trauma, slipped and fell in the yard. EXAM: CT OF THE RIGHT ANKLE WITHOUT CONTRAST TECHNIQUE: Multidetector CT imaging of the right ankle was performed according to the standard protocol. Multiplanar CT image reconstructions were also generated. COMPARISON:  None. FINDINGS: Bones/Joint/Cartilage Acute comminuted fracture of the distal posterior tibial malleolus with 9 mm of distraction at the articular surface. Comminuted and displaced fracture of the medial malleolus. Comminuted fracture of the anterolateral corner of tibial plafond. Posterior subluxation of the talus relative to the tibia. Subchondral cystic changes in the medial and lateral corners of the talar dome. Well corticated bony fragments at the tip of the medial malleolus consistent with sequela prior avulsive injury. Mild osteoarthritis of the subtalar joints. Mild osteoarthritis of the talonavicular joint. Mild osteoarthritis of the navicular-cuneiform joints. Mild osteoarthritis of the second and third tarsometatarsal joints. Small plantar calcaneal spur.  No aggressive osseous lesion. Ligaments Ligaments are suboptimally evaluated by CT. Muscles and Tendons Muscles are normal. No muscle atrophy. Flexor, extensor, peroneal and Achilles tendons are intact. Posterior tibial tendon courses immediately lateral to the major fracture fragment of the posterior malleolus of the distal tibia. Soft tissue No fluid collection or hematoma.  No soft tissue mass. IMPRESSION: 1. Acute comminuted fracture of the distal posterior tibial malleolus with 9 mm of distraction at the articular surface. Comminuted and displaced  fracture of the medial malleolus. Comminuted fracture of the anterolateral corner of tibial plafond. Posterior subluxation of the talus relative to the tibia. Electronically Signed   By: Kathreen Devoid   On: 02/06/2019 13:13   DG Chest Port 1 View  Result Date: 02/20/2019 CLINICAL DATA:  Chest pressure EXAM: PORTABLE CHEST 1 VIEW COMPARISON:  February 12, 2019 FINDINGS: Lungs are clear. Heart size and pulmonary vascularity are normal. No adenopathy. No pneumothorax. No bone lesions. IMPRESSION: Lungs clear.  Cardiac silhouette within normal limits. Electronically Signed   By: Lowella Grip III M.D.   On: 02/20/2019 12:16   DG Chest Portable 1 View  Result Date: 02/07/2019 CLINICAL DATA:  Hypertension, ankle fracture EXAM: PORTABLE CHEST 1 VIEW COMPARISON:  09/25/2007, 08/17/2015 FINDINGS: The heart size and mediastinal contours are within normal limits. Streaky bibasilar opacities. Linear scarring in the left hilar  region. No pleural effusion or pneumothorax. The visualized skeletal structures are unremarkable. IMPRESSION: Streaky bibasilar opacities which could reflect atelectasis or infection. Electronically Signed   By: Davina Poke D.O.   On: 02/07/2019 15:34   DG Ankle Right Port  Result Date: 02/08/2019 CLINICAL DATA:  Status post ORIF of ankle fracture, initial encounter EXAM: PORTABLE RIGHT ANKLE - 2 VIEW COMPARISON:  Intraoperative films from earlier in the same day. FINDINGS: Fixation sideplate is noted along the posterior aspect of the distal tibia. Fixation device bridging the tibia and fibula in the metaphysis is seen. Medial malleolar fracture is again noted and mildly displaced. IMPRESSION: Status post ORIF of right ankle fracture. Electronically Signed   By: Inez Catalina M.D.   On: 02/08/2019 16:34   DG C-Arm 1-60 Min  Result Date: 02/08/2019 CLINICAL DATA:  ORIF right ankle fracture EXAM: DG C-ARM 1-60 MIN; RIGHT ANKLE - 2 VIEW COMPARISON:  02/06/2019 right ankle radiographs  FLUOROSCOPY TIME:  Fluoroscopy Time:  1 minutes 45 seconds Number of Acquired Spot Images: 4 FINDINGS: Nondiagnostic spot fluoroscopic intraoperative right ankle radiographs demonstrate transfixation of posterior malleolar fracture with surgical plate and multiple interlocking screws. IMPRESSION: Intraoperative fluoroscopic guidance for ORIF right ankle fracture. Electronically Signed   By: Ilona Sorrel M.D.   On: 02/08/2019 15:30   ECHOCARDIOGRAM COMPLETE  Result Date: 02/22/2019   ECHOCARDIOGRAM REPORT   Patient Name:   Jasmine Morrow Date of Exam: 02/22/2019 Medical Rec #:  [id number]       Height:       68.0 in Accession #:    [id number]      Weight:       305.0 lb Date of Birth:  September 18, 1945       BSA:          2.45 m Patient Age:    25 years        BP:           123/74 mmHg Patient Gender: F               HR:           139 bpm. Exam Location:  [facility] Procedure: 2D Echo, Color Doppler and Cardiac Doppler Indications:     Bacteremia 790.7  History:         Patient has no prior history of Echocardiogram examinations.                  Risk Factors:Hypertension.  Sonographer:     Sherrie Sport RDCS (AE) Referring Phys:  [id number] Bradly Bienenstock Diagnosing Phys: Yolonda Kida MD  Sonographer Comments: Technically difficult study due to poor echo windows, suboptimal parasternal window, no apical window and no subcostal window. IMPRESSIONS  1. Left ventricular ejection fraction, by visual estimation, is 65 to 70%. The left ventricle has normal function. Normal left ventricular posterior wall thickness. There is no left ventricular hypertrophy.  2. The left ventricle has no regional wall motion abnormalities.  3. Global right ventricle has normal systolic function.The right ventricular size is normal. No increase in right ventricular wall thickness.  4. Left atrial size was normal.  5. Right atrial size was normal.  6. The mitral valve is normal in structure. Trivial mitral valve regurgitation.  7. The tricuspid valve  is normal in structure.  8. The tricuspid valve is normal in structure. Tricuspid valve regurgitation is mild.  9. The aortic valve is normal in structure. Aortic valve regurgitation is not visualized. 10.  The pulmonic valve was abnormal. Pulmonic valve regurgitation is not visualized. 11. The aortic root was not well visualized. 12. Normal pulmonary artery systolic pressure. FINDINGS  Left Ventricle: Left ventricular ejection fraction, by visual estimation, is 65 to 70%. The left ventricle has normal function. The left ventricle has no regional wall motion abnormalities. Normal left ventricular posterior wall thickness. There is no left ventricular hypertrophy. Left ventricular diastolic parameters were normal. Right Ventricle: The right ventricular size is normal. No increase in right ventricular wall thickness. Global RV systolic function is has normal systolic function. The tricuspid regurgitant velocity is 2.19 m/s, and with an assumed right atrial pressure  of 10 mmHg, the estimated right ventricular systolic pressure is normal at 29.2 mmHg. Left Atrium: Left atrial size was normal in size. Right Atrium: Right atrial size was normal in size Pericardium: There is no evidence of pericardial effusion. Mitral Valve: The mitral valve is normal in structure. Trivial mitral valve regurgitation. Tricuspid Valve: The tricuspid valve is normal in structure. Tricuspid valve regurgitation is mild. Aortic Valve: The aortic valve is normal in structure. Aortic valve regurgitation is not visualized. Pulmonic Valve: The pulmonic valve was abnormal. Pulmonic valve regurgitation is not visualized. Pulmonic regurgitation is not visualized. Aorta: The aortic root was not well visualized. IAS/Shunts: No atrial level shunt detected by color flow Doppler.  LEFT VENTRICLE PLAX 2D LVIDd:         3.21 cm LVIDs:         2.05 cm LV PW:         1.38 cm LV IVS:        1.14 cm LVOT diam:     2.00 cm LV SV:         28 ml LV SV Index:   10.46  LVOT Area:     3.14 cm  LEFT ATRIUM         Index LA diam:    3.60 cm 1.47 cm/m   AORTA Ao Root diam: 2.50 cm TRICUSPID VALVE TR Peak grad:   19.2 mmHg TR Vmax:        219.00 cm/s  SHUNTS Systemic Diam: 2.00 cm  Yolonda Kida MD Electronically signed by Yolonda Kida MD Signature Date/Time: 02/22/2019/12:48:45 PM    Final    DG Hip Unilat W or Wo Pelvis 2-3 Views Right  Result Date: 02/06/2019 CLINICAL DATA:  74 year old female with pain after fall in yard. EXAM: DG HIP (WITH OR WITHOUT PELVIS) 2-3V RIGHT COMPARISON:  None. FINDINGS: Femoral heads are normally located. Bone mineralization is within normal limits for age. Intact proximal right femur. Proximal left femur appears grossly intact. No pelvis fracture identified. Degenerative changes at the pubic symphysis. Sacral ala and SI joints appear normal. Advanced degenerative changes in the lower lumbar spine. Negative lower abdominal and pelvic visceral contours. IMPRESSION: No acute fracture or dislocation identified about the right hip or pelvis. Electronically Signed   By: Genevie Ann M.D.   On: 02/06/2019 12:39    EKG: Normal sinus rhythm  Weights: Filed Weights   02/22/19 2100  Weight: 131.8 kg     Physical Exam: Blood pressure (!) 146/87, pulse (!) 104, temperature 98.3 F (36.8 C), resp. rate 14, weight 131.8 kg, SpO2 100 %. Body mass index is 44.18 kg/m. General: Well developed, well nourished, in no acute distress. Head eyes ears nose throat: Normocephalic, atraumatic, sclera non-icteric, no xanthomas, nares are without discharge. No apparent thyromegaly and/or mass  Lungs: Normal respiratory effort.  no wheezes, no rales, no rhonchi.  Heart: RRR with normal S1 S2. no murmur gallop, no rub, PMI is normal size and placement, carotid upstroke normal without bruit, jugular venous pressure is normal Abdomen: Soft, non-tender, non-distended with normoactive bowel sounds. No hepatomegaly. No rebound/guarding. No obvious abdominal  masses. Abdominal aorta is normal size without bruit Extremities: 1+ edema. no cyanosis, no clubbing, positive ulcers  Peripheral : 2+ bilateral upper extremity pulses, 2+ bilateral femoral pulses, 2+ bilateral dorsal pedal pulse Neuro: Alert and oriented. No facial asymmetry. No focal deficit. Moves all extremities spontaneously. Musculoskeletal: Normal muscle tone without kyphosis Psych:  Responds to questions appropriately with a normal affect.    Assessment: 74 year old female with MRSA bacteremia with concerns of bacterial endocarditis  Plan: 1.  Continue supportive care of bacteremia and infection 2 2.  Proceed to transesophageal echocardiogram for further evaluation and treatment options of possibility of endocarditis.  Patient understands the risk and benefits of transesophageal echocardiogram.  This includes the possibility of death stroke heart attack esophageal perforation sore throat and side effects of medications.  The patient is at low risk for moderate sedation  Signed, Corey Skains M.D. [facility] 02/25/2019, 5:08 PM

## 2019-02-26 ENCOUNTER — Inpatient Hospital Stay
Admit: 2019-02-26 | Discharge: 2019-02-26 | Disposition: A | Discharge status: Home / Self Care | Payer: Medicare Other | Attending: Internal Medicine | Admitting: Internal Medicine

## 2019-02-26 ENCOUNTER — Encounter
Admission: EM | Disposition: A | Discharge status: Skilled Nursing Facility | Payer: Self-pay | Source: Home / Self Care | Attending: Family Medicine

## 2019-02-26 DIAGNOSIS — S80921A Unspecified superficial injury of right lower leg, initial encounter: Secondary | ICD-10-CM

## 2019-02-26 DIAGNOSIS — X58XXXA Exposure to other specified factors, initial encounter: Secondary | ICD-10-CM

## 2019-02-26 HISTORY — PX: TEE WITHOUT CARDIOVERSION: SHX5443

## 2019-02-26 LAB — CBC WITH DIFFERENTIAL/PLATELET
Abs Immature Granulocytes: 0.06 10*3/uL (ref 0.00–0.07)
Basophils Absolute: 0 10*3/uL (ref 0.0–0.1)
Basophils Relative: 0 %
Eosinophils Absolute: 0.4 10*3/uL (ref 0.0–0.5)
Eosinophils Relative: 5 %
HCT: 26.6 % — ABNORMAL LOW (ref 36.0–46.0)
Hemoglobin: 7.9 g/dL — ABNORMAL LOW (ref 12.0–15.0)
Immature Granulocytes: 1 %
Lymphocytes Relative: 11 %
Lymphs Abs: 0.8 10*3/uL (ref 0.7–4.0)
MCH: 27.8 pg (ref 26.0–34.0)
MCHC: 29.7 g/dL — ABNORMAL LOW (ref 30.0–36.0)
MCV: 93.7 fL (ref 80.0–100.0)
Monocytes Absolute: 0.8 10*3/uL (ref 0.1–1.0)
Monocytes Relative: 10 %
Neutro Abs: 5.8 10*3/uL (ref 1.7–7.7)
Neutrophils Relative %: 73 %
Platelets: 258 10*3/uL (ref 150–400)
RBC: 2.84 MIL/uL — ABNORMAL LOW (ref 3.87–5.11)
RDW: 17.8 % — ABNORMAL HIGH (ref 11.5–15.5)
WBC: 7.9 10*3/uL (ref 4.0–10.5)
nRBC: 0.3 % — ABNORMAL HIGH (ref 0.0–0.2)

## 2019-02-26 LAB — BASIC METABOLIC PANEL
Anion gap: 6 (ref 5–15)
BUN: 27 mg/dL — ABNORMAL HIGH (ref 8–23)
CO2: 23 mmol/L (ref 22–32)
Calcium: 9.1 mg/dL (ref 8.9–10.3)
Chloride: 114 mmol/L — ABNORMAL HIGH (ref 98–111)
Creatinine, Ser: 0.89 mg/dL (ref 0.44–1.00)
GFR calc Af Amer: >60 mL/min (ref >60)
GFR calc non Af Amer: >60 mL/min (ref >60)
Glucose, Bld: 99 mg/dL (ref 70–99)
Potassium: 3.3 mmol/L — ABNORMAL LOW (ref 3.5–5.1)
Sodium: 143 mmol/L (ref 135–145)

## 2019-02-26 LAB — MRSA PCR SCREENING: MRSA by PCR: NEGATIVE

## 2019-02-26 LAB — MAGNESIUM: Magnesium: 2.1 mg/dL (ref 1.7–2.4)

## 2019-02-26 LAB — PHOSPHORUS: Phosphorus: 2.5 mg/dL (ref 2.5–4.6)

## 2019-02-26 SURGERY — ECHOCARDIOGRAM, TRANSESOPHAGEAL
Anesthesia: Moderate Sedation

## 2019-02-26 MED ORDER — MIDAZOLAM HCL 5 MG/5ML IJ SOLN
INTRAMUSCULAR | Status: AC
  Filled 2019-02-26: qty 5

## 2019-02-26 MED ORDER — MIDAZOLAM HCL 5 MG/5ML IJ SOLN
INTRAMUSCULAR | Status: AC | PRN
  Administered 2019-02-26 (×2): 1 mg via INTRAVENOUS

## 2019-02-26 MED ORDER — SODIUM CHLORIDE 0.9 % IV SOLN: INTRAVENOUS | Status: DC

## 2019-02-26 MED ORDER — POTASSIUM CHLORIDE CRYS ER 20 MEQ PO TBCR
40.0000 meq | EXTENDED_RELEASE_TABLET | ORAL | Status: AC
  Administered 2019-02-26 (×2): 40 meq via ORAL
  Filled 2019-02-26: qty 2

## 2019-02-26 MED ORDER — BUTAMBEN-TETRACAINE-BENZOCAINE 2-2-14 % EX AERO
INHALATION_SPRAY | CUTANEOUS | Status: AC
  Filled 2019-02-26: qty 5

## 2019-02-26 MED ORDER — FENTANYL CITRATE (PF) 100 MCG/2ML IJ SOLN
INTRAMUSCULAR | Status: AC
  Filled 2019-02-26: qty 2

## 2019-02-26 MED ORDER — LIDOCAINE VISCOUS HCL 2 % MT SOLN
OROMUCOSAL | Status: AC
  Filled 2019-02-26: qty 15

## 2019-02-26 MED ORDER — SODIUM CHLORIDE FLUSH 0.9 % IV SOLN
INTRAVENOUS | Status: AC
  Filled 2019-02-26: qty 10

## 2019-02-26 MED ORDER — FENTANYL CITRATE (PF) 100 MCG/2ML IJ SOLN
INTRAMUSCULAR | Status: AC | PRN
  Administered 2019-02-26: 25 ug via INTRAVENOUS

## 2019-02-26 NOTE — Progress Notes (Signed)
*  PRELIMINARY RESULTS* Echocardiogram Echocardiogram Transesophageal has been performed.  Jasmine Morrow 02/26/2019, 1:05 PM

## 2019-02-26 NOTE — Progress Notes (Signed)
Phone call received from husband Yvone Neu Goodin;updated on patient condition.

## 2019-02-26 NOTE — CV Procedure (Signed)
Transesophageal echocardiogram preliminary report  Jasmine Morrow [id number] 1946-01-14  Preliminary diagnosis  Bacteremia with possible endocarditis  Postprocedural diagnosis Normal LV function without evidence of endocarditis or vegetation  Time out A timeout was performed by the nursing staff and physicians specifically identifying the procedure performed, identification of the patient, the type of sedation, all allergies and medications, all pertinent medical history, and presedation assessment of nasopharynx. The patient and or family understand the risks of the procedure including the rare risks of death, stroke, heart attack, esophogeal perforation, sore throat, and reaction to medications given.  Moderate sedation During this procedure the patient has received Versed 2 milligrams and fentanyl 25 micrograms to achieve appropriate moderate sedation.  The patient had continued monitoring of heart rate, oxygenation, blood pressure, respiratory rate, and extent of signs of sedation throughout the entire procedure.  The patient received this moderate sedation over a period of 18 minutes.  Both the nursing staff and I were present during the procedure when the patient had moderate sedation for 100% of the time.  Treatment considerations  No additional treatment considerations needed for bacteremia due to no current evidence of endocarditis  For further details of transesophageal echocardiogram please refer to final report.  Signed,  Corey Skains M.D. [facility] 02/26/2019 1:08 PM

## 2019-02-26 NOTE — Progress Notes (Signed)
PROGRESS NOTE  PCCM transfer  Jasmine Morrow  [id number] DOB: Apr 18, 1945 DOA: 02/20/2019 PCP: Derinda Late, MD   Brief Narrative:  Jasmine Morrow  is a 74 y.o. female with a known history of recent ankle fracture on 1/7 who comes from [place] for chest pain. Found to have hypotension and anemia secondary to melena. Admitted for septic/hemorrhagic shock initially requiring pressor support. She was then intubated due to worsening mental status and inability to protect airway.  Extubated on 02/24/19. Now off the pressors.  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  No more melena or hematemesis.  See if total of 6 units of PRBC and 1 unit of fresh frozen plasma.  Subjective: Patient was feeling better when seen this morning.  She was thirsty as she was n.p.o. for TEE later today.  She was accompanied by her husband who have multiple questions regarding TEE and other procedures including MSSA bacteremia which were answered.  Assessment & Plan:   Active Problems:   Symptomatic anemia   SOB (shortness of breath)   Gastrointestinal hemorrhage   Sepsis ([place])  Septic/hemorrhagic shock.  Resolved. Blood pressure improves and patient is close to her baseline now. Most likely secondary to diverticular bleed.  Patient underwent EGD and colonoscopy along with RBC tagged red cell scan which was negative for any active bleeding.  She received multiple blood transfusions, total of 6 PRBC and 1 unit of FFP. -Hemoglobin is stable around 7.9 today. -Continue with PPI. -Continue to monitor-transfuse if below 7. -PT/OT evaluation.  MSSA bacteremia. Blood cultures growing MSSA, most likely her surgical site is the source. TTE and TEE both were negative for any endocarditis. Antibiotics were deescalated to cefazolin. -ID is following-appreciate their recommendations most likely will need 2 weeks of antibiotic.  AKI.  Resolved now, most likely secondary to  shock. -Avoid nephrotoxic. -Continue to monitor.  Right ankle fracture s/p recent ORIF -Ortho is following.  Currently no sign of infection at the surgical site.  Objective: Vitals:   02/26/19 1305 02/26/19 1315 02/26/19 1330 02/26/19 1335  BP: 130/67 (!) 116/52 (!) 134/92 (!) 135/53  Pulse: 83 82 77 78  Resp: (!) 21 16 14 14   Temp:      TempSrc:      SpO2: 100% 100% 100% 100%  Weight:        Intake/Output Summary (Last 24 hours) at 02/26/2019 1541 Last data filed at 02/26/2019 1433 Gross per 24 hour  Intake 1023.67 ml  Output 1220 ml  Net -196.33 ml   Filed Weights   02/22/19 2100  Weight: 131.8 kg    Examination:  General exam: Appears calm and comfortable  Respiratory system: Clear to auscultation. Respiratory effort normal. Cardiovascular system: S1 & S2 heard, RRR. No JVD, murmurs, rubs, gallops or clicks. Gastrointestinal system: Soft, nontender, nondistended, bowel sounds positive. Central nervous system: Alert and oriented. No focal neurological deficits.Symmetric 5 x 5 power. Extremities: Right ankle with Ace wrap, no edema, no cyanosis, pulses intact  Skin: No rashes, lesions or ulcers Psychiatry: Judgement and insight appear normal. Mood & affect appropriate.    DVT prophylaxis: SCDs Code Status: Full Family Communication: Husband was updated at bedside. Disposition Plan: Pending improvement.  PT/OT ordered for discharge planning.  Consultants:   PCCM  ID  Orthopedic  Procedures:  Antimicrobials:  Cefazolin  Data Reviewed: I have personally reviewed following labs and imaging studies  CBC: Recent Labs  Lab 02/22/19 0100 02/22/19 0917 02/23/19 0540 02/23/19 [id number]  02/23/19 1518 02/23/19 2035 02/24/19 0300 02/25/19 0411 02/26/19 0620  WBC 25.9*  --  21.3*  --   --   --  9.9 8.8 7.9  NEUTROABS 22.3*  --  16.6*  --   --   --  8.1* 7.1 5.8  HGB 7.2*   < > 8.3*   < > 7.4* 8.1* 7.4* 7.5* 7.9*  HCT 22.2*   < > 26.6*   < > 23.1* 25.4* 23.7*  24.8* 26.6*  MCV 89.9  --  93.0  --   --   --  91.9 92.5 93.7  PLT 595*  --  514*  --   --   --  345 303 258   < > = values in this interval not displayed.   Basic Metabolic Panel: Recent Labs  Lab 02/22/19 0100 02/22/19 0100 02/23/19 0540 02/24/19 0300 02/24/19 2059 02/25/19 0411 02/26/19 0620  NA 142  --  147* 146*  --  148* 143  K 3.2*   < > 2.8* 2.7* 3.4* 3.6 3.3*  CL 112*  --  116* 115*  --  120* 114*  CO2 20*  --  21* 23  --  21* 23  GLUCOSE 150*  --  89 96  --  97 99  BUN 90*  --  58* 44*  --  35* 27*  CREATININE 1.73*  --  1.40* 1.01*  --  0.91 0.89  CALCIUM 8.3*  --  8.7* 8.7*  --  8.9 9.1  MG  --   --   --  2.3  --   --  2.1  PHOS  --   --   --  2.6  --   --  2.5   < > = values in this interval not displayed.   GFR: Estimated Creatinine Clearance: 82.2 mL/min (by C-G formula based on SCr of 0.89 mg/dL). Liver Function Tests: Recent Labs  Lab 02/20/19 0955 02/21/19 0138 02/25/19 0411  AST 26 22 28   ALT 18 18 6   ALKPHOS 107 89 84  BILITOT 0.8 0.7 0.8  PROT 6.1* 5.1* 5.1*  ALBUMIN 2.2* 2.0* 2.2*   Recent Labs  Lab 02/20/19 0955  LIPASE 66*   No results for input(s): AMMONIA in the last 168 hours. Coagulation Profile: Recent Labs  Lab 02/21/19 0138  INR 1.4*   Cardiac Enzymes: No results for input(s): CKTOTAL, CKMB, CKMBINDEX, TROPONINI in the last 168 hours. BNP (last 3 results) No results for input(s): PROBNP in the last 8760 hours. HbA1C: No results for input(s): HGBA1C in the last 72 hours. CBG: Recent Labs  Lab 02/23/19 1153 02/24/19 0413 02/24/19 1127 02/24/19 1625 02/25/19 1145  GLUCAP 98 84 96 92 90   Lipid Profile: No results for input(s): CHOL, HDL, LDLCALC, TRIG, CHOLHDL, LDLDIRECT in the last 72 hours. Thyroid Function Tests: No results for input(s): TSH, T4TOTAL, FREET4, T3FREE, THYROIDAB in the last 72 hours. Anemia Panel: No results for input(s): VITAMINB12, FOLATE, FERRITIN, TIBC, IRON, RETICCTPCT in the last 72  hours. Sepsis Labs: Recent Labs  Lab 02/20/19 1159 02/20/19 1609 02/21/19 0138 02/21/19 0700 02/22/19 0100  PROCALCITON  --   --  2.30 2.26 1.09  LATICACIDVEN 2.4* 5.1* 1.1  --   --     Recent Results (from the past 240 hour(s))  Respiratory Panel by RT PCR (Flu A&B, Covid) - Nasopharyngeal Swab     Status: None   Collection Time: 02/20/19  9:55 AM   Specimen: Nasopharyngeal Swab  Result  Value Ref Range Status   SARS Coronavirus 2 by RT PCR NEGATIVE NEGATIVE Final    Comment: (NOTE) SARS-CoV-2 target nucleic acids are NOT DETECTED. The SARS-CoV-2 RNA is generally detectable in upper respiratoy specimens during the acute phase of infection. The lowest concentration of SARS-CoV-2 viral copies this assay can detect is 131 copies/mL. A negative result does not preclude SARS-Cov-2 infection and should not be used as the sole basis for treatment or other patient management decisions. A negative result may occur with  improper specimen collection/handling, submission of specimen other than nasopharyngeal swab, presence of viral mutation(s) within the areas targeted by this assay, and inadequate number of viral copies (<131 copies/mL). A negative result must be combined with clinical observations, patient history, and epidemiological information. The expected result is Negative. Fact Sheet for Patients:  PinkCheek.be Fact Sheet for Healthcare Providers:  GravelBags.it This test is not yet ap proved or cleared by the [place] FDA and  has been authorized for detection and/or diagnosis of SARS-CoV-2 by FDA under an Emergency Use Authorization (EUA). This EUA will remain  in effect (meaning this test can be used) for the duration of the COVID-19 declaration under Section 564(b)(1) of the Act, 21 U.S.C. section 360bbb-3(b)(1), unless the authorization is terminated or revoked sooner.    Influenza A by PCR NEGATIVE NEGATIVE  Final   Influenza B by PCR NEGATIVE NEGATIVE Final    Comment: (NOTE) The Xpert Xpress SARS-CoV-2/FLU/RSV assay is intended as an aid in  the diagnosis of influenza from Nasopharyngeal swab specimens and  should not be used as a sole basis for treatment. Nasal washings and  aspirates are unacceptable for Xpert Xpress SARS-CoV-2/FLU/RSV  testing. Fact Sheet for Patients: PinkCheek.be Fact Sheet for Healthcare Providers: GravelBags.it This test is not yet approved or cleared by the [place] FDA and  has been authorized for detection and/or diagnosis of SARS-CoV-2 by  FDA under an Emergency Use Authorization (EUA). This EUA will remain  in effect (meaning this test can be used) for the duration of the  Covid-19 declaration under Section 564(b)(1) of the Act, 21  U.S.C. section 360bbb-3(b)(1), unless the authorization is  terminated or revoked. Performed at [facility], [address], [place], [place] [address]   Urine culture     Status: Abnormal   Collection Time: 02/20/19 10:46 AM   Specimen: Urine, Clean Catch  Result Value Ref Range Status   Specimen Description   Final    URINE, CLEAN CATCH Performed at [facility], [address] [place], [place] [address]    Special Requests   Final    NONE Performed at [facility], [address] [place], [place] [address]    Culture (A)  Final    <10,000 COLONIES/mL INSIGNIFICANT GROWTH Performed at [facility] [address], [place], [place] [address]    Report Status 02/22/2019 FINAL  Final  Blood culture (routine x 2)     Status: Abnormal   Collection Time: 02/20/19 12:04 PM   Specimen: BLOOD  Result Value Ref Range Status   Specimen Description   Final    BLOOD BLOOD RIGHT HAND Performed at [facility], [address], [place], [place] [address]    Special Requests   Final    BOTTLES DRAWN AEROBIC AND  ANAEROBIC Blood Culture adequate volume Performed at [facility], [address] [place],  [address]    Culture  Setup Time   Final  GRAM POSITIVE COCCI IN BOTH AEROBIC AND ANAEROBIC BOTTLES CRITICAL RESULT CALLED TO, READ BACK BY AND VERIFIED WITH: SCOTT HALL AT [phone] 02/21/19 [place]  Performed at [facility], [address] [place], [place] [address]    Culture STAPHYLOCOCCUS AUREUS (A)  Final   Report Status 02/24/2019 FINAL  Final   Organism ID, Bacteria STAPHYLOCOCCUS AUREUS  Final      Susceptibility   Staphylococcus aureus - MIC*    CIPROFLOXACIN <=0.5 SENSITIVE Sensitive     ERYTHROMYCIN >=8 RESISTANT Resistant     GENTAMICIN <=0.5 SENSITIVE Sensitive     OXACILLIN 0.5 SENSITIVE Sensitive     TETRACYCLINE <=1 SENSITIVE Sensitive     VANCOMYCIN 1 SENSITIVE Sensitive     TRIMETH/SULFA <=10 SENSITIVE Sensitive     CLINDAMYCIN RESISTANT Resistant     RIFAMPIN <=0.5 SENSITIVE Sensitive     Inducible Clindamycin POSITIVE Resistant     * STAPHYLOCOCCUS AUREUS  Blood culture (routine x 2)     Status: Abnormal   Collection Time: 02/20/19 12:33 PM   Specimen: BLOOD  Result Value Ref Range Status   Specimen Description   Final    BLOOD RIGHT ANTECUBITAL Performed at [facility], [address] [place], [place] [address]    Special Requests   Final    BOTTLES DRAWN AEROBIC AND ANAEROBIC Blood Culture adequate volume Performed at [facility], [address] [place], [place] [address]    Culture  Setup Time   Final    GRAM POSITIVE COCCI IN BOTH AEROBIC AND ANAEROBIC BOTTLES CRITICAL RESULT CALLED TO, READ BACK BY AND VERIFIED WITH: Rito Ehrlich AT [id number] 02/21/19 SDR Performed at [facility], [address] [place], [place] [address]    Culture (A)  Final    STAPHYLOCOCCUS AUREUS SUSCEPTIBILITIES PERFORMED ON PREVIOUS CULTURE WITHIN THE LAST 5 DAYS. Performed at [facility], [place] [address], [place],  [place] [address]    Report Status 02/23/2019 FINAL  Final  Blood Culture ID Panel (Reflexed)     Status: Abnormal   Collection Time: 02/20/19 12:33 PM  Result Value Ref Range Status   Enterococcus species NOT DETECTED NOT DETECTED Final   Listeria monocytogenes NOT DETECTED NOT DETECTED Final   Staphylococcus species DETECTED (A) NOT DETECTED Final    Comment: CRITICAL RESULT CALLED TO, READ BACK BY AND VERIFIED WITH: Rito Ehrlich AT [id number] 02/21/19 SDR    Staphylococcus aureus (BCID) DETECTED (A) NOT DETECTED Final    Comment: Methicillin (oxacillin) susceptible Staphylococcus aureus (MSSA). Preferred therapy is anti staphylococcal beta lactam antibiotic (Cefazolin or Nafcillin), unless clinically contraindicated. CRITICAL RESULT CALLED TO, READ BACK BY AND VERIFIED WITH: Rito Ehrlich AT [id number] 02/21/19 SDR    Methicillin resistance NOT DETECTED NOT DETECTED Final   Streptococcus species NOT DETECTED NOT DETECTED Final   Streptococcus agalactiae NOT DETECTED NOT DETECTED Final   Streptococcus pneumoniae NOT DETECTED NOT DETECTED Final   Streptococcus pyogenes NOT DETECTED NOT DETECTED Final   Acinetobacter baumannii NOT DETECTED NOT DETECTED Final   Enterobacteriaceae species NOT DETECTED NOT DETECTED Final   Enterobacter cloacae complex NOT DETECTED NOT DETECTED Final   Escherichia coli NOT DETECTED NOT DETECTED Final   Klebsiella oxytoca NOT DETECTED NOT DETECTED Final   Klebsiella pneumoniae NOT DETECTED NOT DETECTED Final   Proteus species NOT DETECTED NOT DETECTED Final   Serratia marcescens NOT DETECTED NOT DETECTED Final   Haemophilus influenzae NOT DETECTED NOT DETECTED Final   Neisseria meningitidis NOT DETECTED NOT  DETECTED Final   Pseudomonas aeruginosa NOT DETECTED NOT DETECTED Final   Candida albicans NOT DETECTED NOT DETECTED Final   Candida glabrata NOT DETECTED NOT DETECTED Final   Candida krusei NOT DETECTED NOT DETECTED Final   Candida parapsilosis NOT DETECTED NOT DETECTED  Final   Candida tropicalis NOT DETECTED NOT DETECTED Final    Comment: Performed at [facility], [address] [place], [place] [address]  C difficile quick scan w PCR reflex     Status: Abnormal   Collection Time: 02/20/19  6:16 PM   Specimen: STOOL  Result Value Ref Range Status   C Diff antigen POSITIVE (A) NEGATIVE Final   C Diff toxin NEGATIVE NEGATIVE Final   C Diff interpretation Results are indeterminate. See PCR results.  Final    Comment: Performed at [facility], [address] [place], [place] [address]  C. Diff by PCR, Reflexed     Status: None   Collection Time: 02/20/19  6:16 PM  Result Value Ref Range Status   Toxigenic C. Difficile by PCR NEGATIVE NEGATIVE Final    Comment: Patient is colonized with non toxigenic C. difficile. May not need treatment unless significant symptoms are present. Performed at [facility], [address] [place], [place] [address]   GI pathogen panel by PCR, stool     Status: Abnormal   Collection Time: 02/21/19  4:04 PM   Specimen: Stool  Result Value Ref Range Status   Plesiomonas shigelloides NOT DETECTED NOT DETECTED Final   Yersinia enterocolitica NOT DETECTED NOT DETECTED Final   Vibrio NOT DETECTED NOT DETECTED Final   Enteropathogenic E coli DETECTED (A) NOT DETECTED Final   E coli (ETEC) LT/ST NOT DETECTED NOT DETECTED Final   E coli [id number] by PCR Not applicable NOT DETECTED Final   Cryptosporidium by PCR NOT DETECTED NOT DETECTED Final   Entamoeba histolytica NOT DETECTED NOT DETECTED Final   Adenovirus F 40/41 NOT DETECTED NOT DETECTED Final   Norovirus GI/GII NOT DETECTED NOT DETECTED Final   Sapovirus NOT DETECTED NOT DETECTED Final    Comment: (NOTE) Performed At: [facility] [address] [place], [place] [id number] Rush Farmer MD [id number]    Vibrio cholerae NOT DETECTED NOT DETECTED Final   Campylobacter by PCR NOT DETECTED NOT DETECTED Final   Salmonella by PCR NOT  DETECTED NOT DETECTED Final   E coli (STEC) NOT DETECTED NOT DETECTED Final   Enteroaggregative E coli NOT DETECTED NOT DETECTED Final   Shigella by PCR NOT DETECTED NOT DETECTED Final   Cyclospora cayetanensis NOT DETECTED NOT DETECTED Final   Astrovirus NOT DETECTED NOT DETECTED Final   G lamblia by PCR NOT DETECTED NOT DETECTED Final   Rotavirus A by PCR NOT DETECTED NOT DETECTED Final  CULTURE, BLOOD (ROUTINE X 2) w Reflex to ID Panel     Status: None (Preliminary result)   Collection Time: 02/22/19 12:59 AM   Specimen: BLOOD  Result Value Ref Range Status   Specimen Description BLOOD RIGHT ANTECUBITAL  Final   Special Requests   Final    BOTTLES DRAWN AEROBIC ONLY Blood Culture adequate volume   Culture   Final    NO GROWTH 4 DAYS Performed at [facility], [address] [place], [place] [address]    Report Status PENDING  Incomplete  CULTURE, BLOOD (ROUTINE X 2) w Reflex to ID Panel     Status: None (Preliminary result)   Collection Time: 02/22/19 12:59 AM  Specimen: BLOOD  Result Value Ref Range Status   Specimen Description BLOOD BLOOD RIGHT HAND  Final   Special Requests   Final    BOTTLES DRAWN AEROBIC AND ANAEROBIC Blood Culture adequate volume   Culture   Final    NO GROWTH 4 DAYS Performed at [facility], [address], [place], [place] [address]    Report Status PENDING  Incomplete  Aerobic Culture (superficial specimen)     Status: None (Preliminary result)   Collection Time: 02/24/19  2:00 PM   Specimen: Wound  Result Value Ref Range Status   Specimen Description   Final    WOUND Performed at [facility], [address], [place], [place] [address]    Special Requests   Final    NONE Performed at [facility], [address] [place], [place] [address]    Gram Stain   Final    RARE WBC PRESENT, PREDOMINANTLY PMN NO ORGANISMS SEEN    Culture   Final    NO GROWTH 2 DAYS Performed at [facility],  [place] [address] [place], [place] [address]    Report Status PENDING  Incomplete  MRSA PCR Screening     Status: None   Collection Time: 02/26/19  4:51 AM   Specimen: Nasal Mucosa; Nasopharyngeal  Result Value Ref Range Status   MRSA by PCR NEGATIVE NEGATIVE Final    Comment:        The GeneXpert MRSA Assay (FDA approved for NASAL specimens only), is one component of a comprehensive MRSA colonization surveillance program. It is not intended to diagnose MRSA infection nor to guide or monitor treatment for MRSA infections. Performed at [facility], [address], [place], [place] [address]      Radiology Studies: No results found.  Scheduled Meds: . butamben-tetracaine-benzocaine      . calcium-vitamin D  2 tablet Oral Daily  . chlorhexidine  15 mL Mouth Rinse BID  . Chlorhexidine Gluconate Cloth  6 each Topical Daily  . fentaNYL      . lidocaine      . loratadine  10 mg Oral Daily  . mouth rinse  15 mL Mouth Rinse q12n4p  . midazolam      . pantoprazole  40 mg Oral BID AC  . potassium chloride  40 mEq Oral Q4H  . sodium chloride flush       Continuous Infusions: .  ceFAZolin (ANCEF) IV 2 g (02/26/19 1438)  . dextrose Stopped (02/26/19 0512)     LOS: 6 days   Time spent: 45 minutes.  I personally reviewed her chart.  Lorella Nimrod, MD [facility] Pager [phone]  If 7PM-7AM, please contact night-coverage [email or web address] Password [facility] 02/26/2019, 3:41 PM   This record has been created using Dragon voice recognition software. Errors have been sought and corrected,but may not always be located. Such creation errors do not reflect on the standard of care.

## 2019-02-26 NOTE — Care Management Important Message (Signed)
Important Message  Patient Details  Name: Jasmine Morrow MRN: [id number] Date of Birth: 1945/12/20   Medicare Important Message Given:  Yes     Dannette Barbara 02/26/2019, 12:42 PM

## 2019-02-26 NOTE — Plan of Care (Signed)

## 2019-02-26 NOTE — Progress Notes (Signed)
ID  Out of ICU Says she is feeling much better Had TEE today  BP (!) 135/53   Pulse 78   Temp 97.7 F (36.5 C)   Resp 14   Wt 131.8 kg   SpO2 100%   BMI 44.18 kg/m    O/e awake and alert Chest b/l air entry Hss1s2 abd soft Edema legs Rt leg dressing not removed   CBC Latest Ref Rng & Units 02/26/2019 02/25/2019 02/24/2019  WBC 4.0 - 10.5 K/uL 7.9 8.8 9.9  Hemoglobin 12.0 - 15.0 g/dL 7.9(L) 7.5(L) 7.4(L)  Hematocrit 36.0 - 46.0 % 26.6(L) 24.8(L) 23.7(L)  Platelets 150 - 400 K/uL 258 303 345    CBC Latest Ref Rng & Units 02/26/2019 02/25/2019 02/24/2019  WBC 4.0 - 10.5 K/uL 7.9 8.8 9.9  Hemoglobin 12.0 - 15.0 g/dL 7.9(L) 7.5(L) 7.4(L)  Hematocrit 36.0 - 46.0 % 26.6(L) 24.8(L) 23.7(L)  Platelets 150 - 400 K/uL 258 303 345    BC-02/20/19 MSSA 02/22/19 BC -NG  02/24/19 Wound culture- NG  Impression/Recommendation MSSA bacteremia- source could be the superficial wound on the front of the rt leg The surgical site looks fine CT of the leg has no abscess or osteo Culture of the wound sent- NG so far TEE -No endocarditis Repeat culture of blood neg Continue cefazolin x 4 weeks  ORIF- rt ankle for fracture Has hardware  Concern for infection but CT no evidence , before hardware is placed will need a culture from deep tissue- will discuss with Dr.Menz  Severe acute anemia due to GI bleed  As per Dr.toledo this is diverticular bleeding  Leukemoid reaction- resolved Severe thrombocytosis - resolved   AKI- resolved  Discussed the management with patient

## 2019-02-26 NOTE — Progress Notes (Signed)
Pharmacy Electrolyte Monitoring Consult:  Pharmacy consulted to assist in monitoring and replacing electrolytes in this 74 y.o. female admitted on 02/20/2019 with Chest Pain   Labs:  Sodium (mmol/L)  Date Value  02/26/2019 143   Potassium (mmol/L)  Date Value  02/26/2019 3.3 (L)   Magnesium (mg/dL)  Date Value  02/26/2019 2.1   Phosphorus (mg/dL)  Date Value  02/26/2019 2.5   Calcium (mg/dL)  Date Value  02/26/2019 9.1   Albumin (g/dL)  Date Value  02/25/2019 2.2 (L)    Assessment/Plan: Potassium 40 mEq PO x 2 doses. Will order BMP with am labs.  Goal potassium ~ 4 and goal magnesium ~ 2.   Pharmacy will continue to monitor and adjust per consult.   Tawnya Crook, PharmD 02/26/2019 1:57 PM

## 2019-02-26 NOTE — Progress Notes (Signed)
[facility] Encounter Note  Patient: Jasmine Morrow / Admit Date: 02/20/2019 / Date of Encounter: 02/26/2019, 1:11 PM   Subjective: Patient feeling well today with no evidence of significant cardiovascular symptoms.  Patient improved with wound infection and hemodynamically stable. Transesophageal echocardiogram showing normal LV systolic function with ejection fraction of 60% No evidence of significant valvular heart disease endocarditis or vegetation seen  Review of Systems: Positive for: Shortness of breath Negative for: Vision change, hearing change, syncope, dizziness, nausea, vomiting,diarrhea, bloody stool, stomach pain, cough, congestion, diaphoresis, urinary frequency, urinary pain,skin lesions, skin rashes Others previously listed  Objective: Telemetry: Normal sinus rhythm Physical Exam: Blood pressure 123/62, pulse 76, temperature 97.7 F (36.5 C), resp. rate 14, weight 131.8 kg, SpO2 100 %. Body mass index is 44.18 kg/m. General: Well developed, well nourished, in no acute distress. Head: Normocephalic, atraumatic, sclera non-icteric, no xanthomas, nares are without discharge. Neck: No apparent masses Lungs: Normal respirations with no wheezes, no rhonchi, no rales , no crackles   Heart: Regular rate and rhythm, normal S1 S2, no murmur, no rub, no gallop, PMI is normal size and placement, carotid upstroke normal without bruit, jugular venous pressure normal Abdomen: Soft, non-tender, non-distended with normoactive bowel sounds. No hepatosplenomegaly. Abdominal aorta is normal size without bruit Extremities: 1+ edema, no clubbing, no cyanosis, positive ulcers,  Peripheral: 2+ radial, 2+ femoral, 2+ dorsal pedal pulses Neuro: Alert and oriented. Moves all extremities spontaneously. Psych:  Responds to questions appropriately with a normal affect.   Intake/Output Summary (Last 24 hours) at 02/26/2019 1311 Last data filed at 02/26/2019 0518 Gross per 24  hour  Intake 1023.67 ml  Output 970 ml  Net 53.67 ml    Inpatient Medications:  . butamben-tetracaine-benzocaine      . [MAR Hold] calcium-vitamin D  2 tablet Oral Daily  . [MAR Hold] chlorhexidine  15 mL Mouth Rinse BID  . [MAR Hold] Chlorhexidine Gluconate Cloth  6 each Topical Daily  . fentaNYL      . lidocaine      . [MAR Hold] loratadine  10 mg Oral Daily  . [MAR Hold] mouth rinse  15 mL Mouth Rinse q12n4p  . midazolam      . [MAR Hold] pantoprazole  40 mg Oral BID AC  . [MAR Hold] potassium chloride  40 mEq Oral Q4H  . sodium chloride flush       Infusions:  . sodium chloride    . [MAR Hold]  ceFAZolin (ANCEF) IV 2 g (02/26/19 0512)  . dextrose Stopped (02/26/19 0512)    Labs: Recent Labs    02/24/19 0300 02/24/19 2059 02/25/19 0411 02/26/19 0620  NA 146*  --  148* 143  K 2.7*   < > 3.6 3.3*  CL 115*  --  120* 114*  CO2 23  --  21* 23  GLUCOSE 96  --  97 99  BUN 44*  --  35* 27*  CREATININE 1.01*  --  0.91 0.89  CALCIUM 8.7*  --  8.9 9.1  MG 2.3  --   --  2.1  PHOS 2.6  --   --  2.5   < > = values in this interval not displayed.   Recent Labs    02/25/19 0411  AST 28  ALT 6  ALKPHOS 84  BILITOT 0.8  PROT 5.1*  ALBUMIN 2.2*   Recent Labs    02/25/19 0411 02/26/19 0620  WBC 8.8 7.9  NEUTROABS 7.1 5.8  HGB  7.5* 7.9*  HCT 24.8* 26.6*  MCV 92.5 93.7  PLT 303 258   No results for input(s): CKTOTAL, CKMB, TROPONINI in the last 72 hours. Invalid input(s): POCBNP No results for input(s): HGBA1C in the last 72 hours.   Weights: Filed Weights   02/22/19 2100  Weight: 131.8 kg     Radiology/Studies:  CT ABDOMEN PELVIS WO CONTRAST  Result Date: 02/20/2019 CLINICAL DATA:  Abdomen pain fever postop EXAM: CT ABDOMEN AND PELVIS WITHOUT CONTRAST TECHNIQUE: Multidetector CT imaging of the abdomen and pelvis was performed following the standard protocol without IV contrast. COMPARISON:  Chest x-ray 02/20/2019 FINDINGS: Lower chest: Lung bases  demonstrate no acute consolidation or effusion. The heart size is within normal limits. Hepatobiliary: Gallbladder is not clearly identified and is presumably surgically absent. Extrahepatic common bile duct slightly enlarged at 1 cm. No focal hepatic abnormality Pancreas: Unremarkable. No pancreatic ductal dilatation or surrounding inflammatory changes. Spleen: Normal in size without focal abnormality. Adrenals/Urinary Tract: Adrenal glands are normal. No hydronephrosis. Low-attenuation lesions in the kidneys likely cysts. No ureteral stone. Distended urinary bladder Stomach/Bowel: Radiopaque material in the stomach. No dilated small bowel. No bowel wall thickening. Sigmoid colon diverticula without acute inflammatory change. Appendix not well seen but no right lower quadrant inflammatory process. Vascular/Lymphatic: Nonaneurysmal aorta with mild aortic atherosclerosis. No significantly enlarged lymph nodes Reproductive: Small uterine calcifications, possible fibroids. No adnexal mass. Other: Negative for free air or free fluid. Musculoskeletal: Grade 1 anterolisthesis L4 on L5. Degenerative changes of the spine. No acute osseous abnormality. IMPRESSION: 1. No CT evidence for acute intra-abdominal or intrapelvic abnormality. 2. Sigmoid colon diverticular disease without acute inflammatory change. 3. Nonvisualized gallbladder presumably due to prior cholecystectomy. Prominent extrahepatic bile duct which may be secondary to postsurgical change though suggest correlation with LFTs. Electronically Signed   By: Donavan Foil M.D.   On: 02/20/2019 17:23   DG Chest 2 View  Result Date: 02/12/2019 CLINICAL DATA:  Cough. EXAM: CHEST - 2 VIEW COMPARISON:  02/07/2019. FINDINGS: Mediastinum hilar structures normal. Heart size stable. Mild bibasilar subsegmental atelectasis and or scarring. Mild left base infiltrate cannot be excluded. No pleural effusion or pneumothorax. IMPRESSION: Mild bibasilar subsegmental atelectasis  and or scarring. Mild left base infiltrate cannot be excluded. Electronically Signed   By: Marcello Moores  Register   On: 02/12/2019 10:15   DG Thoracic Spine 2 View  Result Date: 02/11/2019 CLINICAL DATA:  Back pain EXAM: THORACIC SPINE 2 VIEWS COMPARISON:  None. FINDINGS: Vertebral body heights and alignment are maintained. Mild degenerative changes are present. Included lungs are clear. Normal heart size. IMPRESSION: Mild degenerative changes.  No compression deformity identified. Electronically Signed   By: Macy Mis M.D.   On: 02/11/2019 08:36   DG Lumbar Spine 2-3 Views  Result Date: 02/06/2019 CLINICAL DATA:  Low back pain and right hip pain secondary to a fall in her yard this morning. EXAM: LUMBAR SPINE - 2-3 VIEW COMPARISON:  None. FINDINGS: There is no fracture or bone destruction. Grade 1 spondylolisthesis of L4 on L5 and of L5 on S1 felt to be secondary to severe bilateral facet arthritis. Chronic degenerative disc disease at L1-2 and L2-3 with disc space narrowing and posterior osteophytes protruding into the spinal canal at each of those levels. Minimal lumbar scoliosis with convexity to the right centered at L2-3. IMPRESSION: 1. No acute abnormality. 2. 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 3. Severe degenerative disc disease at L1-2 and  L2-3. Electronically Signed   By: Lorriane Shire M.D.   On: 02/06/2019 12:39   DG Ankle 2 Views Right  Result Date: 02/08/2019 CLINICAL DATA:  ORIF right ankle fracture EXAM: DG C-ARM 1-60 MIN; RIGHT ANKLE - 2 VIEW COMPARISON:  02/06/2019 right ankle radiographs FLUOROSCOPY TIME:  Fluoroscopy Time:  1 minutes 45 seconds Number of Acquired Spot Images: 4 FINDINGS: Nondiagnostic spot fluoroscopic intraoperative right ankle radiographs demonstrate transfixation of posterior malleolar fracture with surgical plate and multiple interlocking screws. IMPRESSION: Intraoperative fluoroscopic guidance for ORIF  right ankle fracture. Electronically Signed   By: Ilona Sorrel M.D.   On: 02/08/2019 15:30   DG Ankle Complete Right  Result Date: 02/06/2019 CLINICAL DATA:  Right ankle pain secondary to a fall in the patient's yard. EXAM: RIGHT ANKLE - COMPLETE 3+ VIEW COMPARISON:  None. FINDINGS: There is a fracture dislocation at the right ankle. There are displaced fractures of the medial and lateral malleoli. There is disruption of the distal tibiofibular syndesmosis with dislocation of the foot posteriorly with respect to the tibia. Minimal osteophyte formation on the dorsum of the midfoot. IMPRESSION: 1. Fracture dislocation of the right ankle. 2. Medial and lateral malleolar fractures with disruption of the distal tibiofibular syndesmosis. Electronically Signed   By: Lorriane Shire M.D.   On: 02/06/2019 10:16   CT LUMBAR SPINE WO CONTRAST  Result Date: 02/22/2019 CLINICAL DATA:  Low back and right hip pain since recent fall on 02/06/2019. Bacteremia with suspected infection/inflammation. EXAM: CT LUMBAR SPINE WITHOUT CONTRAST TECHNIQUE: Multidetector CT imaging of the lumbar spine was performed without intravenous contrast administration. Multiplanar CT image reconstructions were also generated. COMPARISON:  Abdominopelvic CT 02/20/2019, chest CT 08/17/2015 and lumbar spine radiographs 02/06/2019. FINDINGS: Segmentation: There are 5 lumbar type vertebral bodies as correlated with previous imaging. Alignment: Stable grade 1 degenerative anterolisthesis at L4-5 and L5-S1. Mild convex right scoliosis. Vertebrae: No evidence of acute fracture or pars defect. No evidence osteomyelitis. There is multilevel spondylosis with endplate osteophytes and facet hypertrophy. Paraspinal and other soft tissues: No paraspinal inflammatory changes or fluid collections. Diffuse aortic and branch vessel atherosclerosis. Exophytic 4 cm cyst from the lower pole of the right kidney. There are additional low-density retroperitoneal masses on  the right, lateral to the psoas muscle, measuring up to 3.2 cm on image 111/6. 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 Disc levels: There is mild disc degeneration and endplate osteophyte formation in the thoracic spine extending from T9-10 through T12-L1. No resulting spinal stenosis or nerve root encroachment. L1-2: Loss of disc height with annular disc bulging and endplate osteophytes. No significant spinal stenosis or nerve root encroachment. L2-3: Advanced loss of disc height with vacuum phenomenon, annular disc bulging and endplate osteophytes. Mild facet and ligamentous hypertrophy. These factors contribute to mild spinal stenosis and mild left-greater-than-right foraminal narrowing. L3-4: Relatively preserved disc height with mild disc bulging and endplate osteophyte formation asymmetric to the left. Moderate facet and ligamentous hypertrophy. These factors contribute to mild spinal stenosis with mild asymmetric narrowing of the lateral recess and foramen on the left. L4-5: Mild loss of disc height with mild annular disc bulging. Advanced bilateral facet hypertrophy, accounting for the grade 1 anterolisthesis. Resulting mild-to-moderate spinal stenosis with narrowing of both lateral recesses and asymmetric right foraminal narrowing. L5-S1: Advanced bilateral facet hypertrophy accounting for the grade 1 anterolisthesis. Advanced degenerative disc disease with loss of disc height, vacuum phenomenon and endplate osteophytes asymmetric to the right. Moderate  foraminal narrowing bilaterally. IMPRESSION: 1. No CT evidence of spinal or paraspinal infection. 2. Multilevel spondylosis, similar to recent abdominal CT. Disc and endplate degeneration are most advanced at the L2-3 and L5-S1 levels. There are advanced facet degenerative changes bilaterally at L4-5 and L5-S1. Resulting spinal and foraminal stenosis as detailed above; the right foraminal narrowing  is worst at L4-5. 3. Lobulated low-density retroperitoneal mass on the right has a nonaggressive appearance and may reflect a lymphangioma, although is incompletely evaluated by this noncontrast study. This is lateral to the psoas muscle and unrelated to the spine. Electronically Signed   By: Richardean Sale M.D.   On: 02/22/2019 15:33   NM GI Blood Loss  Result Date: 02/22/2019 CLINICAL DATA:  Bright red blood per rectum. History of black tarry stools. EXAM: NUCLEAR MEDICINE GASTROINTESTINAL BLEEDING SCAN TECHNIQUE: Sequential abdominal images were obtained following intravenous administration of Tc-46m labeled red blood cells. RADIOPHARMACEUTICALS:  19.4 mCi Tc-54m pertechnetate in-vitro labeled red cells. COMPARISON:  CT 02/20/2019. FINDINGS: Following administration radiopharmaceutical standard on static images obtained. No evidence of GI bleed noted. IMPRESSION: No evidence of GI bleed. Electronically Signed   By: Marcello Moores  Register   On: 02/22/2019 15:07   CT ANKLE RIGHT WO CONTRAST  Result Date: 02/22/2019 CLINICAL DATA:  The patient suffered right ankle fractures in a trip and fall 02/06/2019. She underwent fixation 02/08/2019. The patient was then admitted to the hospital 02/20/2019 with chest discomfort and oxygen desaturation, elevated white blood cell count and possible sepsis. Subsequent encounter. EXAM: CT OF THE RIGHT ANKLE WITHOUT CONTRAST TECHNIQUE: Multidetector CT imaging of the right ankle was performed according to the standard protocol. Multiplanar CT image reconstructions were also generated. COMPARISON:  CT and plain films right ankle 02/06/2019. Plain films right ankle 02/08/2019. FINDINGS: Bones/Joint/Cartilage The patient is status post plate and screw fixation of a posterior malleolar fracture and repair of the syndesmosis with wires and plate along the distal fibula. Hardware is intact without evidence of loosening or infection. No bony destructive change or periosteal reaction is  identified. Inferiorly displaced transverse fracture through the base of the medial malleolus is unchanged. There is also a fracture of the anterior corner of the distal tibia on the lateral side where a fracture fragment measuring 1 cm in diameter is laterally rotated and displaced, unchanged. Lateral tilt and posterior subluxation of the talus at the tibiotalar joint have been reduced. Posterior malleolar fracture is in near anatomic position and alignment. There is mild widening of the medial clear space. Tiny subchondral cyst in both the medial and lateral talar dome are again seen. Mild midfoot osteoarthritis is noted. There is a very small plantar calcaneal spur. Ligaments Suboptimally assessed by CT. Muscles and Tendons Intact and normal in appearance. No evidence of tenosynovitis or tendon entrapment. Soft tissues No joint effusion. Mild soft tissue swelling is seen about the ankle. No focal fluid collection or soft tissue gas. IMPRESSION: Negative for evidence of osteomyelitis.  No abscess is identified. Postoperative change as described above. Position and alignment of the patient's fractures are improved. Electronically Signed   By: Inge Rise M.D.   On: 02/22/2019 13:48   CT Ankle Right Wo Contrast  Result Date: 02/06/2019 CLINICAL DATA:  Ankle trauma, slipped and fell in the yard. EXAM: CT OF THE RIGHT ANKLE WITHOUT CONTRAST TECHNIQUE: Multidetector CT imaging of the right ankle was performed according to the standard protocol. Multiplanar CT image reconstructions were also generated. COMPARISON:  None. FINDINGS: Bones/Joint/Cartilage Acute comminuted fracture of  the distal posterior tibial malleolus with 9 mm of distraction at the articular surface. Comminuted and displaced fracture of the medial malleolus. Comminuted fracture of the anterolateral corner of tibial plafond. Posterior subluxation of the talus relative to the tibia. Subchondral cystic changes in the medial and lateral corners of  the talar dome. Well corticated bony fragments at the tip of the medial malleolus consistent with sequela prior avulsive injury. Mild osteoarthritis of the subtalar joints. Mild osteoarthritis of the talonavicular joint. Mild osteoarthritis of the navicular-cuneiform joints. Mild osteoarthritis of the second and third tarsometatarsal joints. Small plantar calcaneal spur.  No aggressive osseous lesion. Ligaments Ligaments are suboptimally evaluated by CT. Muscles and Tendons Muscles are normal. No muscle atrophy. Flexor, extensor, peroneal and Achilles tendons are intact. Posterior tibial tendon courses immediately lateral to the major fracture fragment of the posterior malleolus of the distal tibia. Soft tissue No fluid collection or hematoma.  No soft tissue mass. IMPRESSION: 1. Acute comminuted fracture of the distal posterior tibial malleolus with 9 mm of distraction at the articular surface. Comminuted and displaced fracture of the medial malleolus. Comminuted fracture of the anterolateral corner of tibial plafond. Posterior subluxation of the talus relative to the tibia. Electronically Signed   By: Kathreen Devoid   On: 02/06/2019 13:13   DG Chest Port 1 View  Result Date: 02/20/2019 CLINICAL DATA:  Chest pressure EXAM: PORTABLE CHEST 1 VIEW COMPARISON:  February 12, 2019 FINDINGS: Lungs are clear. Heart size and pulmonary vascularity are normal. No adenopathy. No pneumothorax. No bone lesions. IMPRESSION: Lungs clear.  Cardiac silhouette within normal limits. Electronically Signed   By: Lowella Grip III M.D.   On: 02/20/2019 12:16   DG Chest Portable 1 View  Result Date: 02/07/2019 CLINICAL DATA:  Hypertension, ankle fracture EXAM: PORTABLE CHEST 1 VIEW COMPARISON:  09/25/2007, 08/17/2015 FINDINGS: The heart size and mediastinal contours are within normal limits. Streaky bibasilar opacities. Linear scarring in the left hilar region. No pleural effusion or pneumothorax. The visualized skeletal  structures are unremarkable. IMPRESSION: Streaky bibasilar opacities which could reflect atelectasis or infection. Electronically Signed   By: Davina Poke D.O.   On: 02/07/2019 15:34   DG Ankle Right Port  Result Date: 02/08/2019 CLINICAL DATA:  Status post ORIF of ankle fracture, initial encounter EXAM: PORTABLE RIGHT ANKLE - 2 VIEW COMPARISON:  Intraoperative films from earlier in the same day. FINDINGS: Fixation sideplate is noted along the posterior aspect of the distal tibia. Fixation device bridging the tibia and fibula in the metaphysis is seen. Medial malleolar fracture is again noted and mildly displaced. IMPRESSION: Status post ORIF of right ankle fracture. Electronically Signed   By: Inez Catalina M.D.   On: 02/08/2019 16:34   DG C-Arm 1-60 Min  Result Date: 02/08/2019 CLINICAL DATA:  ORIF right ankle fracture EXAM: DG C-ARM 1-60 MIN; RIGHT ANKLE - 2 VIEW COMPARISON:  02/06/2019 right ankle radiographs FLUOROSCOPY TIME:  Fluoroscopy Time:  1 minutes 45 seconds Number of Acquired Spot Images: 4 FINDINGS: Nondiagnostic spot fluoroscopic intraoperative right ankle radiographs demonstrate transfixation of posterior malleolar fracture with surgical plate and multiple interlocking screws. IMPRESSION: Intraoperative fluoroscopic guidance for ORIF right ankle fracture. Electronically Signed   By: Ilona Sorrel M.D.   On: 02/08/2019 15:30   ECHOCARDIOGRAM COMPLETE  Result Date: 02/22/2019   ECHOCARDIOGRAM REPORT   Patient Name:   ZANDREA OLES Date of Exam: 02/22/2019 Medical Rec #:  [id number]       Height:  68.0 in Accession #:    [id number]      Weight:       305.0 lb Date of Birth:  [birth year]       BSA:          2.45 m Patient Age:    74 years        BP:           123/74 mmHg Patient Gender: F               HR:           139 bpm. Exam Location:  [facility] Procedure: 2D Echo, Color Doppler and Cardiac Doppler Indications:     Bacteremia 790.7  History:         Patient has no prior history of  Echocardiogram examinations.                  Risk Factors:Hypertension.  Sonographer:     Sherrie Sport RDCS (AE) Referring Phys:  [id number] Bradly Bienenstock Diagnosing Phys: Yolonda Kida MD  Sonographer Comments: Technically difficult study due to poor echo windows, suboptimal parasternal window, no apical window and no subcostal window. IMPRESSIONS  1. Left ventricular ejection fraction, by visual estimation, is 65 to 70%. The left ventricle has normal function. Normal left ventricular posterior wall thickness. There is no left ventricular hypertrophy.  2. The left ventricle has no regional wall motion abnormalities.  3. Global right ventricle has normal systolic function.The right ventricular size is normal. No increase in right ventricular wall thickness.  4. Left atrial size was normal.  5. Right atrial size was normal.  6. The mitral valve is normal in structure. Trivial mitral valve regurgitation.  7. The tricuspid valve is normal in structure.  8. The tricuspid valve is normal in structure. Tricuspid valve regurgitation is mild.  9. The aortic valve is normal in structure. Aortic valve regurgitation is not visualized. 10. The pulmonic valve was abnormal. Pulmonic valve regurgitation is not visualized. 11. The aortic root was not well visualized. 12. Normal pulmonary artery systolic pressure. FINDINGS  Left Ventricle: Left ventricular ejection fraction, by visual estimation, is 65 to 70%. The left ventricle has normal function. The left ventricle has no regional wall motion abnormalities. Normal left ventricular posterior wall thickness. There is no left ventricular hypertrophy. Left ventricular diastolic parameters were normal. Right Ventricle: The right ventricular size is normal. No increase in right ventricular wall thickness. Global RV systolic function is has normal systolic function. The tricuspid regurgitant velocity is 2.19 m/s, and with an assumed right atrial pressure  of 10 mmHg, the estimated  right ventricular systolic pressure is normal at 29.2 mmHg. Left Atrium: Left atrial size was normal in size. Right Atrium: Right atrial size was normal in size Pericardium: There is no evidence of pericardial effusion. Mitral Valve: The mitral valve is normal in structure. Trivial mitral valve regurgitation. Tricuspid Valve: The tricuspid valve is normal in structure. Tricuspid valve regurgitation is mild. Aortic Valve: The aortic valve is normal in structure. Aortic valve regurgitation is not visualized. Pulmonic Valve: The pulmonic valve was abnormal. Pulmonic valve regurgitation is not visualized. Pulmonic regurgitation is not visualized. Aorta: The aortic root was not well visualized. IAS/Shunts: No atrial level shunt detected by color flow Doppler.  LEFT VENTRICLE PLAX 2D LVIDd:         3.21 cm LVIDs:         2.05 cm LV PW:  1.38 cm LV IVS:        1.14 cm LVOT diam:     2.00 cm LV SV:         28 ml LV SV Index:   10.46 LVOT Area:     3.14 cm  LEFT ATRIUM         Index LA diam:    3.60 cm 1.47 cm/m   AORTA Ao Root diam: 2.50 cm TRICUSPID VALVE TR Peak grad:   19.2 mmHg TR Vmax:        219.00 cm/s  SHUNTS Systemic Diam: 2.00 cm  Yolonda Kida MD Electronically signed by Yolonda Kida MD Signature Date/Time: 02/22/2019/12:48:45 PM    Final    DG Hip Unilat W or Wo Pelvis 2-3 Views Right  Result Date: 02/06/2019 CLINICAL DATA:  74 year old female with pain after fall in yard. EXAM: DG HIP (WITH OR WITHOUT PELVIS) 2-3V RIGHT COMPARISON:  None. FINDINGS: Femoral heads are normally located. Bone mineralization is within normal limits for age. Intact proximal right femur. Proximal left femur appears grossly intact. No pelvis fracture identified. Degenerative changes at the pubic symphysis. Sacral ala and SI joints appear normal. Advanced degenerative changes in the lower lumbar spine. Negative lower abdominal and pelvic visceral contours. IMPRESSION: No acute fracture or dislocation identified about  the right hip or pelvis. Electronically Signed   By: Genevie Ann M.D.   On: 02/06/2019 12:39     Assessment and Recommendation  74 y.o. female with known borderline hypertension hyperlipidemia having significant lower leg wound infection with Staphylococcus bacteremia slowly improving with antibiotic therapy without evidence of cardiac involvement vegetation and/or endocarditis 1.  Continue supportive care of infection 2.  No further cardiac intervention or diagnostic necessary at this time 3.  Call if further questions  Signed, Serafina Royals M.D. FACC

## 2019-02-27 ENCOUNTER — Inpatient Hospital Stay: Payer: Self-pay

## 2019-02-27 LAB — BASIC METABOLIC PANEL
Anion gap: 7 (ref 5–15)
BUN: 22 mg/dL (ref 8–23)
CO2: 20 mmol/L — ABNORMAL LOW (ref 22–32)
Calcium: 8.7 mg/dL — ABNORMAL LOW (ref 8.9–10.3)
Chloride: 115 mmol/L — ABNORMAL HIGH (ref 98–111)
Creatinine, Ser: 0.66 mg/dL (ref 0.44–1.00)
GFR calc Af Amer: >60 mL/min (ref >60)
GFR calc non Af Amer: >60 mL/min (ref >60)
Glucose, Bld: 91 mg/dL (ref 70–99)
Potassium: 3.7 mmol/L (ref 3.5–5.1)
Sodium: 142 mmol/L (ref 135–145)

## 2019-02-27 LAB — CBC
HCT: 25.5 % — ABNORMAL LOW (ref 36.0–46.0)
Hemoglobin: 8.1 g/dL — ABNORMAL LOW (ref 12.0–15.0)
MCH: 28.3 pg (ref 26.0–34.0)
MCHC: 31.8 g/dL (ref 30.0–36.0)
MCV: 89.2 fL (ref 80.0–100.0)
Platelets: 207 10*3/uL (ref 150–400)
RBC: 2.86 MIL/uL — ABNORMAL LOW (ref 3.87–5.11)
RDW: 17.2 % — ABNORMAL HIGH (ref 11.5–15.5)
WBC: 8.4 10*3/uL (ref 4.0–10.5)
nRBC: 0 % (ref 0.0–0.2)

## 2019-02-27 LAB — CULTURE, BLOOD (ROUTINE X 2)
Culture: NO GROWTH
Culture: NO GROWTH
Special Requests: ADEQUATE
Special Requests: ADEQUATE

## 2019-02-27 MED ORDER — SODIUM CHLORIDE 0.9% FLUSH
10.0000 mL | Freq: Two times a day (BID) | INTRAVENOUS | Status: DC
  Administered 2019-02-27 – 2019-03-05 (×7): 10 mL

## 2019-02-27 MED ORDER — SODIUM CHLORIDE 0.9% FLUSH: 10.0000 mL | INTRAVENOUS | Status: DC | PRN

## 2019-02-27 MED ORDER — SODIUM CHLORIDE 0.9% FLUSH
10.0000 mL | Freq: Two times a day (BID) | INTRAVENOUS | Status: DC
  Administered 2019-02-27 – 2019-03-06 (×13): 10 mL

## 2019-02-27 NOTE — Evaluation (Signed)
Physical Therapy Evaluation Patient Details Name: Jasmine Morrow MRN: [id number] DOB: 1945/06/27 Today's Date: 02/27/2019   History of Present Illness  Pt is 74 y.o. female with a known history of recent ankle fracture and ORIF 02/08/2019. Admitted from from [facility] for chest pain. Found to have hypotension and anemia secondary to melena. Admitted for septic/hemorrhagic shock initially requiring pressor support. She was then intubated due to worsening mental status and inability to protect airway.  Extubated on 02/24/19. Now off the pressors.  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  No more melena or hematemesis. Has had total of 6 units of PRBC and 1 unit of fresh frozen plasma.    Clinical Impression  Patient alert, anxious throughout session oriented to self. With leading questions/cues patient oriented to date/year and location [facility]). Pt did express a few facts during session that leads this therapist to believe she was somewhat oriented to situation. No family at beside to confirm PLOF, acquired from recent hospital stay.  The patient needed physical assistance for LE exercises. Supine <> sit with max-totalAx2, total assist needed for LE management, maxAx2 for trunk elevation and to reposition patient at EOB to midline. The patient was able to sit EOB for several minutes with poor-fair balance, pt self limiting due to anxiety/fear. CGA-minA provided during sitting ADL tasks. Further mobility held due to weakness, elevated HR (in 130's with sitting EOB), and patient anxiety.  Overall the patient demonstrated deficits (see "PT Problem List") that impede the patient's functional abilities, safety, and mobility and would benefit from skilled PT intervention. Recommendation SNF.     Follow Up Recommendations SNF    Equipment Recommendations  None recommended by PT    Recommendations for Other Services       Precautions / Restrictions  Precautions Precautions: Fall Precaution Comments: R ankle cast Restrictions Weight Bearing Restrictions: Yes RLE Weight Bearing: Touchdown weight bearing      Mobility  Bed Mobility Overal bed mobility: Needs Assistance Bed Mobility: Sit to Supine;Supine to Sit     Supine to sit: HOB elevated Sit to supine: Max assist;+2 for physical assistance   General bed mobility comments: Pt needed totalA for LE management to transfer to EOB, and maxA for trunk elevation. Return to supine transfer very similiar level of assistance needed  Transfers                 General transfer comment: deferred due to patient anxiety/fear, elevated HR and weakness  Ambulation/Gait                Stairs            Wheelchair Mobility    Modified Rankin (Stroke Patients Only)       Balance Overall balance assessment: Needs assistance;History of Falls Sitting-balance support: Single extremity supported;Bilateral upper extremity supported;Feet supported   Sitting balance - Comments: Pt intermittently able to sit EOB with unilateral UE support, self limited due to fear. CGA for confidence, occasional minA                                     Pertinent Vitals/Pain Pain Assessment: Faces Faces Pain Scale: Hurts a little bit Pain Location: RLEwith movement Pain Descriptors / Indicators: Aching Pain Intervention(s): Limited activity within patient's tolerance;Monitored during session;Repositioned    Home Living Family/patient expects to be discharged to:: Private residence Living Arrangements: Spouse/significant other Available  Help at Discharge: Family;Available 24 hours/day Type of Home: House Home Access: Stairs to enter Entrance Stairs-Rails: None Entrance Stairs-Number of Steps: 1 Home Layout: One level Home Equipment: Walker - 4 wheels Additional Comments: PLOF gathered from chart. Pt disoriented this AM and able to provide some information    Prior  Function           Comments: Pt reports she was very active prior to admission, working out at [employer] and using the treadmill. She reports no other falls previously, wasn't using an AD     Hand Dominance   Dominant Hand: Left    Extremity/Trunk Assessment   Upper Extremity Assessment Upper Extremity Assessment: Defer to OT evaluation;Generalized weakness    Lower Extremity Assessment Lower Extremity Assessment: RLE deficits/detail;Defer to PT evaluation;LLE deficits/detail RLE Deficits / Details: s/p ORIF R trimalleolar pt to be TTWB. Pt unable to lift LE against gravity without assistance LLE Deficits / Details: assistance needed for heel slide and SLR       Communication   Communication: No difficulties  Cognition Arousal/Alertness: Awake/alert Behavior During Therapy: Anxious Overall Cognitive Status: No family/caregiver present to determine baseline cognitive functioning Area of Impairment: Attention;Following commands;Awareness;Orientation                 Orientation Level: Disoriented to;Situation;Time;Place(with cueing and time patient able to state she was in [place], the month/year, and that she had had surgery on her ankle) Current Attention Level: Selective   Following Commands: Follows one step commands consistently       General Comments: Oriented to self, with time and leading questions pt oriented to time and [place]. Pt anxious throughout session requesting her husband to be present and for therapist to call him throughout. Therapist did attempt to call husband, unable to get a hold of him.      General Comments      Exercises Other Exercises Other Exercises: heel slides bilaterally with AAROMx5. Patient sat EOB for several minutes to perform some ADLs with OT. Other Exercises: PT provided patient with active listening over concerns, encouragement as needed and education about importance of mobility   Assessment/Plan    PT  Assessment Patient needs continued PT services  PT Problem List Decreased strength;Decreased range of motion;Decreased activity tolerance;Decreased balance;Decreased mobility;Obesity;Pain       PT Treatment Interventions DME instruction;Gait training;Therapeutic activities;Therapeutic exercise;Balance training    PT Goals (Current goals can be found in the Care Plan section)  Acute Rehab PT Goals Patient Stated Goal: to see her husband PT Goal Formulation: With patient Time For Goal Achievement: 03/13/19 Potential to Achieve Goals: Fair    Frequency 7X/week   Barriers to discharge Inaccessible home environment;Decreased caregiver support      Co-evaluation PT/OT/SLP Co-Evaluation/Treatment: Yes Reason for Co-Treatment: For patient/therapist safety;To address functional/ADL transfers PT goals addressed during session: Mobility/safety with mobility;Balance;Proper use of DME;Strengthening/ROM OT goals addressed during session: ADL's and self-care;Proper use of Adaptive equipment and DME;Strengthening/ROM       AM-PAC PT "6 Clicks" Mobility  Outcome Measure Help needed turning from your back to your side while in a flat bed without using bedrails?: Total Help needed moving from lying on your back to sitting on the side of a flat bed without using bedrails?: Total   Help needed standing up from a chair using your arms (e.g., wheelchair or bedside chair)?: Total Help needed to walk in hospital room?: Total Help needed climbing 3-5 steps with a railing? : Total 6 Click Score:  5    End of Session Equipment Utilized During Treatment: Gait belt Activity Tolerance: Other (comment);Patient limited by fatigue(limited by patient fear/anxiety) Patient left: in bed;with call bell/phone within reach;with nursing/sitter in room Nurse Communication: Mobility status PT Visit Diagnosis: Muscle weakness (generalized) (M62.81);History of falling (Z91.81);Difficulty in walking, not elsewhere  classified (R26.2);Pain Pain - Right/Left: Right Pain - part of body: Ankle and joints of foot    Time: 1000-1038 PT Time Calculation (min) (ACUTE ONLY): 38 min   Charges:   PT Evaluation $PT Eval Moderate Complexity: 1 Mod PT Treatments $Therapeutic Exercise: 8-22 mins        Lieutenant Diego PT, DPT 11:57 AM,02/27/19

## 2019-02-27 NOTE — Treatment Plan (Signed)
Diagnosis: Staph aureus bacteremia Baseline Creatinine 0.66    Allergies  Allergen Reactions  . Celecoxib Nausea Only  . Hydrocodone-Acetaminophen Nausea And Vomiting  . Meloxicam Nausea Only  . Morphine Sulfate Er Beads Itching  . Naproxen Swelling  . Rofecoxib Nausea Only  . Sulfa Antibiotics Other (See Comments)    Reaction: unknown  . Ciprofloxacin Rash  . Nickel Rash  . Tramadol-Acetaminophen Nausea Only and Rash    OPAT Orders Discharge antibiotics: Cefazolin 2 grams IV every 8 hours End Date:03/29/19   [facility] Care Per Protocol:including placement of biopatch  Labs weekly while on IV antibiotics: _X_ CBC with differential  _X_ CMP   _X_ Please pull PIC at completion of IV antibiotics   Fax weekly labs to Dr.Younis Mathey  [phone]  Clinic Follow Up Appt:3 weeks- call [phone]

## 2019-02-27 NOTE — Evaluation (Signed)
Occupational Therapy Evaluation Patient Details Name: Jasmine Morrow MRN: [id number] DOB: [birth year] Today's Date: 02/27/2019    History of Present Illness Pt is 74 y.o. female with a known history of recent ankle fracture and ORIF 02/08/2019. Admitted from from [facility] for chest pain. Found to have hypotension and anemia secondary to melena. Admitted for septic/hemorrhagic shock initially requiring pressor support. She was then intubated due to worsening mental status and inability to protect airway.  Extubated on 02/24/19. Now off the pressors.  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  No more melena or hematemesis. Has had total of 6 units of PRBC and 1 unit of fresh frozen plasma.   Clinical Impression   Pt was seen for OT evaluation this date. Prior to hospital admission, pt was currently at [facility] for rehab following previous hospitalization with ankle fracture. Pt typically lives at home with spouse. Currently pt demonstrates impairments as described below (See OT problem list) which functionally limit her ability to perform ADL/self-care tasks. Pt currently requires MAX A +2 persons to transition sup<>sit and intermittent MIN A for EOB sitting. Pt requires MOD A with UB bathing, and MAX to TOTAL A for most LB ADLs at this time.  Pt would benefit from skilled OT to address noted impairments and functional limitations (see below for any additional details) in order to maximize safety and independence while minimizing falls risk and caregiver burden. Upon hospital discharge, recommend SNF at d/c for pt to get some STR to improve fxl activity tolerance and strength as it pertains to ADLs/ADL mobility.     Follow Up Recommendations  SNF    Equipment Recommendations  Other (comment)(defer to next level of care)    Recommendations for Other Services       Precautions / Restrictions Precautions Precautions: Fall Precaution Comments: R ankle  cast Restrictions Weight Bearing Restrictions: Yes RLE Weight Bearing: Touchdown weight bearing      Mobility Bed Mobility Overal bed mobility: Needs Assistance Bed Mobility: Sit to Supine;Supine to Sit     Supine to sit: Max assist;+2 for physical assistance;HOB elevated Sit to supine: Max assist;+2 for physical assistance      Transfers                 General transfer comment: fxl transfer not assessed at this time as pt HR in 130s with MAX A x2 for sup to sit and reamined elevated entire time while upright sitting EOB.    Balance Overall balance assessment: Needs assistance;History of Falls Sitting-balance support: Single extremity supported;Bilateral upper extremity supported;Feet supported Sitting balance-Leahy Scale: Good Sitting balance - Comments: CGA to MIN A occasionally to correct pt, pt primarily maintains without assist. Initially she uses B UEs for support, but is eventually able to sustain static sitting without UEs.                                   ADL either performed or assessed with clinical judgement   ADL Overall ADL's : Needs assistance/impaired Eating/Feeding: Set up;Sitting   Grooming: Wash/dry face;Oral care;Set up;Sitting   Upper Body Bathing: Moderate assistance;Sitting   Lower Body Bathing: Total assistance;Bed level   Upper Body Dressing : Moderate assistance;Sitting   Lower Body Dressing: Total assistance;Bed level     Toilet Transfer Details (indicate cue type and reason): fxl transfer not assessed at this time as pt HR in 130s with  MAX A x2 for sup to sit and reamined elevated entire time while upright sitting EOB.                 Vision Baseline Vision/History: Wears glasses Wears Glasses: At all times Patient Visual Report: No change from baseline       Perception     Praxis      Pertinent Vitals/Pain Pain Assessment: Faces Faces Pain Scale: Hurts a little bit Pain Location: RLEwith  movement Pain Descriptors / Indicators: Aching Pain Intervention(s): Limited activity within patient's tolerance;Monitored during session;Repositioned     Hand Dominance Left   Extremity/Trunk Assessment Upper Extremity Assessment Upper Extremity Assessment: Generalized weakness(moves through full arc of motion. Shld/elbow/grip 4-/5 bilaterally)   Lower Extremity Assessment Lower Extremity Assessment: Defer to PT evaluation;Generalized weakness;RLE deficits/detail RLE Deficits / Details: s/p ORIF R trimalleolar pt to be TTWB. Pt unable to lift LE against gravity without assistance       Communication Communication Communication: No difficulties   Cognition Arousal/Alertness: Awake/alert Behavior During Therapy: Anxious Overall Cognitive Status: No family/caregiver present to determine baseline cognitive functioning Area of Impairment: Attention;Following commands;Orientation;Memory;Safety/judgement;Problem solving                 Orientation Level: Disoriented to;Situation;Time;Place Current Attention Level: Selective Memory: Decreased short-term memory Following Commands: Follows one step commands consistently Safety/Judgement: Decreased awareness of safety;Decreased awareness of deficits   Problem Solving: Slow processing;Decreased initiation;Requires verbal cues;Requires tactile cues General Comments: Oriented to self, with time and leading questions pt oriented to time and [place]. Pt anxious throughout session requesting her husband to be present and for therapist to call him throughout. Therapist did attempt to call husband, unable to get a hold of him.   General Comments       Exercises Other Exercises Other Exercises: OT facilitates implementation of therapeutic use of self help address concerns re: contacting husband and encourage pt to participate as she is able.   Shoulder Instructions      Home Living Family/patient expects to be discharged to::  Private residence Living Arrangements: Spouse/significant other Available Help at Discharge: Family;Available 24 hours/day Type of Home: House Home Access: Stairs to enter [employer] of Steps: 1 Entrance Stairs-Rails: None Home Layout: One level               Home Equipment: Walker - 4 wheels   Additional Comments: PLOF gathered from chart. Pt disoriented this AM and able to provide some information      Prior Functioning/Environment Level of Independence: Needs assistance        Comments: Pt reports she was very active prior to admission, working out at [employer] and using the treadmill. She reports no other falls previously, wasn't using an AD prior to last admission        OT Problem List: Decreased strength;Decreased coordination;Pain;Cardiopulmonary status limiting activity;Decreased activity tolerance;Decreased safety awareness;Impaired balance (sitting and/or standing);Decreased knowledge of use of DME or AE;Decreased knowledge of precautions;Obesity;Decreased range of motion;Decreased cognition      OT Treatment/Interventions: Self-care/ADL training;Therapeutic exercise;Therapeutic activities;DME and/or AE instruction;Patient/family education;Balance training;Energy conservation    OT Goals(Current goals can be found in the care plan section) Acute Rehab OT Goals Patient Stated Goal: to see her husband OT Goal Formulation: With patient Time For Goal Achievement: 03/13/19 Potential to Achieve Goals: Good  OT Frequency: Min 2X/week   Barriers to D/C: Decreased caregiver support;Inaccessible home environment          Co-evaluation PT/OT/SLP Co-Evaluation/Treatment: Yes Reason  for Co-Treatment: For patient/therapist safety;To address functional/ADL transfers PT goals addressed during session: Mobility/safety with mobility;Balance;Proper use of DME;Strengthening/ROM OT goals addressed during session: ADL's and self-care;Proper use of Adaptive  equipment and DME;Strengthening/ROM      AM-PAC OT "6 Clicks" Daily Activity     Outcome Measure Help from another person eating meals?: None Help from another person taking care of personal grooming?: A Little Help from another person toileting, which includes using toliet, bedpan, or urinal?: A Lot Help from another person bathing (including washing, rinsing, drying)?: A Lot Help from another person to put on and taking off regular upper body clothing?: A Little Help from another person to put on and taking off regular lower body clothing?: A Lot 6 Click Score: 16   End of Session    Activity Tolerance: Patient tolerated treatment well;No increased pain Patient left: in bed;with call bell/phone within reach;with bed alarm set  OT Visit Diagnosis: Other abnormalities of gait and mobility (R26.89);History of falling (Z91.81);Pain Pain - Right/Left: Right Pain - part of body: Ankle and joints of foot                Time: [id number] OT Time Calculation (min): 31 min Charges:  OT General Charges $OT Visit: 1 Visit OT Evaluation $OT Eval Moderate Complexity: 1 Mod OT Treatments $Self Care/Home Management : 8-22 mins  Gerrianne Scale, MS, OTR/L ascom [phone] 02/27/19, 4:34 PM

## 2019-02-27 NOTE — Progress Notes (Signed)
Pharmacy Electrolyte Monitoring Consult:  Pharmacy consulted to assist in monitoring and replacing electrolytes in this 74 y.o. female admitted on 02/20/2019 with Chest Pain   Labs:  Sodium (mmol/L)  Date Value  02/27/2019 142   Potassium (mmol/L)  Date Value  02/27/2019 3.7   Magnesium (mg/dL)  Date Value  02/26/2019 2.1   Phosphorus (mg/dL)  Date Value  02/26/2019 2.5   Calcium (mg/dL)  Date Value  02/27/2019 8.7 (L)   Albumin (g/dL)  Date Value  02/25/2019 2.2 (L)    Assessment/Plan: No replacement indicated. Will defer lab monitoring to MD.  Goal potassium ~ 4 and goal magnesium ~ 2.   Pharmacy available to adjust per consult.   Tawnya Crook, PharmD 02/27/2019 11:59 AM

## 2019-02-27 NOTE — Consult Note (Signed)
PHARMACY CONSULT NOTE FOR:  OUTPATIENT  PARENTERAL ANTIBIOTIC THERAPY (OPAT)  Indication: MSSA bacteremia Regimen: Ancef 2 g q8H  End date: 03/29/19  IV antibiotic discharge orders are pended. To discharging provider:  please sign these orders via discharge navigator,  Select New Orders & click on the button choice - Manage This Unsigned Work.     Thank you for allowing pharmacy to be a part of this patient's care.  Oswald Hillock 02/27/2019, 4:45 PM

## 2019-02-27 NOTE — Progress Notes (Signed)
PROGRESS NOTE  PCCM transfer  Jasmine Morrow  [id number] DOB: Dec 13, 1945 DOA: 02/20/2019 PCP: Derinda Late, MD   Brief Narrative:  Jasmine Morrow  is a 74 y.o. female with a known history of recent ankle fracture on 1/7 who comes from [place] for chest pain. Found to have hypotension and anemia secondary to melena. Admitted for septic/hemorrhagic shock initially requiring pressor support. She was then intubated due to worsening mental status and inability to protect airway.  Extubated on 02/24/19. Now off the pressors.  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  No more melena or hematemesis.  See if total of 6 units of PRBC and 1 unit of fresh frozen plasma.  Subjective: Patient was feeling better when seen this morning.  No new complaints.  Assessment & Plan:   Active Problems:   Symptomatic anemia   SOB (shortness of breath)   Gastrointestinal hemorrhage   Sepsis ([place])  Septic/hemorrhagic shock.  Resolved. Blood pressure improves and patient is close to her baseline now. Most likely secondary to diverticular bleed.  Patient underwent EGD and colonoscopy along with RBC tagged red cell scan which was negative for any active bleeding.  She received multiple blood transfusions, total of 6 PRBC and 1 unit of FFP. -Hemoglobin is stable around 8.1 today. -Continue with PPI. -Continue to monitor-transfuse if below 7. -PT/OT evaluation-ending SNF placement. Patient came from [facility], social worker consult was placed to look where she can be discharged as she is medically stable enough to go. -Discontinue femoral line and rectal tube.  MSSA bacteremia. Blood cultures growing MSSA, most likely her surgical site is the source. TTE and TEE both were negative for any endocarditis. Antibiotics were deescalated to cefazolin-will need 4 weeks of cefazolin. -PICC line order were placed. -ID is following-appreciate their recommendations.  AKI.   Resolved now, most likely secondary to shock. -Avoid nephrotoxic. -Continue to monitor.  Right ankle fracture s/p recent ORIF -Ortho is following.  Currently no sign of infection at the surgical site.  Objective: Vitals:   02/26/19 1949 02/27/19 0502 02/27/19 0503 02/27/19 0717  BP: (!) 120/55 125/60 125/60 (!) 120/54  Pulse: 70 85 85 79  Resp: 19 19 19 18   Temp: 98.4 F (36.9 C) (!) 97.4 F (36.3 C) (!) 97.4 F (36.3 C) 98.5 F (36.9 C)  TempSrc: Oral Oral Oral Oral  SpO2: 100%  100% 100%  Weight:        Intake/Output Summary (Last 24 hours) at 02/27/2019 1307 Last data filed at 02/27/2019 [id number] Gross per 24 hour  Intake 827.87 ml  Output 1550 ml  Net -722.13 ml   Filed Weights   02/22/19 2100  Weight: 131.8 kg    Examination:  General exam: Chronically ill-appearing lady, appears calm and comfortable  Respiratory system: Clear to auscultation. Respiratory effort normal. Cardiovascular system: S1 & S2 heard, RRR. No JVD, murmurs, rubs, gallops or clicks. Gastrointestinal system: Soft, nontender, nondistended, bowel sounds positive. Central nervous system: Alert and oriented. No focal neurological deficits.Symmetric 5 x 5 power. Extremities: Right ankle with Ace wrap, no edema, no cyanosis, pulses intact  Skin: No rashes, lesions or ulcers Psychiatry: Judgement and insight appear normal. Mood & affect appropriate.    DVT prophylaxis: SCDs Code Status: Full Family Communication: No family at bedside. Disposition Plan: PT/OT recommending SNF placement, waiting for bed availability.  Medically stable now.  Consultants:   PCCM  ID  Orthopedic  Procedures:  Antimicrobials:  Cefazolin  Data Reviewed:  I have personally reviewed following labs and imaging studies  CBC: Recent Labs  Lab 02/22/19 0100 02/22/19 0917 02/23/19 0540 02/23/19 0907 02/23/19 2035 02/24/19 0300 02/25/19 0411 02/26/19 0620 02/27/19 0541  WBC 25.9*  --  21.3*  --   --  9.9 8.8  7.9 8.4  NEUTROABS 22.3*  --  16.6*  --   --  8.1* 7.1 5.8  --   HGB 7.2*   < > 8.3*   < > 8.1* 7.4* 7.5* 7.9* 8.1*  HCT 22.2*   < > 26.6*   < > 25.4* 23.7* 24.8* 26.6* 25.5*  MCV 89.9  --  93.0  --   --  91.9 92.5 93.7 89.2  PLT 595*  --  514*  --   --  345 303 258 207   < > = values in this interval not displayed.   Basic Metabolic Panel: Recent Labs  Lab 02/23/19 0540 02/23/19 0540 02/24/19 0300 02/24/19 2059 02/25/19 0411 02/26/19 0620 02/27/19 0541  NA 147*  --  146*  --  148* 143 142  K 2.8*   < > 2.7* 3.4* 3.6 3.3* 3.7  CL 116*  --  115*  --  120* 114* 115*  CO2 21*  --  23  --  21* 23 20*  GLUCOSE 89  --  96  --  97 99 91  BUN 58*  --  44*  --  35* 27* 22  CREATININE 1.40*  --  1.01*  --  0.91 0.89 0.66  CALCIUM 8.7*  --  8.7*  --  8.9 9.1 8.7*  MG  --   --  2.3  --   --  2.1  --   PHOS  --   --  2.6  --   --  2.5  --    < > = values in this interval not displayed.   GFR: Estimated Creatinine Clearance: 91.4 mL/min (by C-G formula based on SCr of 0.66 mg/dL). Liver Function Tests: Recent Labs  Lab 02/21/19 0138 02/25/19 0411  AST 22 28  ALT 18 6  ALKPHOS 89 84  BILITOT 0.7 0.8  PROT 5.1* 5.1*  ALBUMIN 2.0* 2.2*   No results for input(s): LIPASE, AMYLASE in the last 168 hours. No results for input(s): AMMONIA in the last 168 hours. Coagulation Profile: Recent Labs  Lab 02/21/19 0138  INR 1.4*   Cardiac Enzymes: No results for input(s): CKTOTAL, CKMB, CKMBINDEX, TROPONINI in the last 168 hours. BNP (last 3 results) No results for input(s): PROBNP in the last 8760 hours. HbA1C: No results for input(s): HGBA1C in the last 72 hours. CBG: Recent Labs  Lab 02/23/19 1153 02/24/19 0413 02/24/19 1127 02/24/19 1625 02/25/19 1145  GLUCAP 98 84 96 92 90   Lipid Profile: No results for input(s): CHOL, HDL, LDLCALC, TRIG, CHOLHDL, LDLDIRECT in the last 72 hours. Thyroid Function Tests: No results for input(s): TSH, T4TOTAL, FREET4, T3FREE, THYROIDAB in  the last 72 hours. Anemia Panel: No results for input(s): VITAMINB12, FOLATE, FERRITIN, TIBC, IRON, RETICCTPCT in the last 72 hours. Sepsis Labs: Recent Labs  Lab 02/20/19 1609 02/21/19 0138 02/21/19 0700 02/22/19 0100  PROCALCITON  --  2.30 2.26 1.09  LATICACIDVEN 5.1* 1.1  --   --     Recent Results (from the past 240 hour(s))  Respiratory Panel by RT PCR (Flu A&B, Covid) - Nasopharyngeal Swab     Status: None   Collection Time: 02/20/19  9:55 AM   Specimen: Nasopharyngeal  Swab  Result Value Ref Range Status   SARS Coronavirus 2 by RT PCR NEGATIVE NEGATIVE Final    Comment: (NOTE) SARS-CoV-2 target nucleic acids are NOT DETECTED. The SARS-CoV-2 RNA is generally detectable in upper respiratoy specimens during the acute phase of infection. The lowest concentration of SARS-CoV-2 viral copies this assay can detect is 131 copies/mL. A negative result does not preclude SARS-Cov-2 infection and should not be used as the sole basis for treatment or other patient management decisions. A negative result may occur with  improper specimen collection/handling, submission of specimen other than nasopharyngeal swab, presence of viral mutation(s) within the areas targeted by this assay, and inadequate number of viral copies (<131 copies/mL). A negative result must be combined with clinical observations, patient history, and epidemiological information. The expected result is Negative. Fact Sheet for Patients:  PinkCheek.be Fact Sheet for Healthcare Providers:  GravelBags.it This test is not yet ap proved or cleared by the [place] FDA and  has been authorized for detection and/or diagnosis of SARS-CoV-2 by FDA under an Emergency Use Authorization (EUA). This EUA will remain  in effect (meaning this test can be used) for the duration of the COVID-19 declaration under Section 564(b)(1) of the Act, 21 U.S.C. section  360bbb-3(b)(1), unless the authorization is terminated or revoked sooner.    Influenza A by PCR NEGATIVE NEGATIVE Final   Influenza B by PCR NEGATIVE NEGATIVE Final    Comment: (NOTE) The Xpert Xpress SARS-CoV-2/FLU/RSV assay is intended as an aid in  the diagnosis of influenza from Nasopharyngeal swab specimens and  should not be used as a sole basis for treatment. Nasal washings and  aspirates are unacceptable for Xpert Xpress SARS-CoV-2/FLU/RSV  testing. Fact Sheet for Patients: PinkCheek.be Fact Sheet for Healthcare Providers: GravelBags.it This test is not yet approved or cleared by the [place] FDA and  has been authorized for detection and/or diagnosis of SARS-CoV-2 by  FDA under an Emergency Use Authorization (EUA). This EUA will remain  in effect (meaning this test can be used) for the duration of the  Covid-19 declaration under Section 564(b)(1) of the Act, 21  U.S.C. section 360bbb-3(b)(1), unless the authorization is  terminated or revoked. Performed at [facility], [address] [place], [place] [address]   Urine culture     Status: Abnormal   Collection Time: 02/20/19 10:46 AM   Specimen: Urine, Clean Catch  Result Value Ref Range Status   Specimen Description   Final    URINE, CLEAN CATCH Performed at [facility], [address] [place], [place] [address]    Special Requests   Final    NONE Performed at [facility], [address] [place], [place] [address]    Culture (A)  Final    <10,000 COLONIES/mL INSIGNIFICANT GROWTH Performed at [facility] [address] [place], [place] [address]    Report Status 02/22/2019 FINAL  Final  Blood culture (routine x 2)     Status: Abnormal   Collection Time: 02/20/19 12:04 PM   Specimen: BLOOD  Result Value Ref Range Status   Specimen Description   Final    BLOOD BLOOD RIGHT HAND Performed at [facility], [address], [place], [place] [address]    Special Requests   Final    BOTTLES DRAWN AEROBIC AND ANAEROBIC Blood Culture adequate volume Performed at [facility], [address] [place], [place] [address]    Culture  Setup Time  Final    GRAM POSITIVE COCCI IN BOTH AEROBIC AND ANAEROBIC BOTTLES CRITICAL RESULT CALLED TO, READ BACK BY AND VERIFIED WITH: SCOTT HALL AT [phone] 02/21/19 [place]  Performed at [facility], [address] [place], [place] [address]    Culture STAPHYLOCOCCUS AUREUS (A)  Final   Report Status 02/24/2019 FINAL  Final   Organism ID, Bacteria STAPHYLOCOCCUS AUREUS  Final      Susceptibility   Staphylococcus aureus - MIC*    CIPROFLOXACIN <=0.5 SENSITIVE Sensitive     ERYTHROMYCIN >=8 RESISTANT Resistant     GENTAMICIN <=0.5 SENSITIVE Sensitive     OXACILLIN 0.5 SENSITIVE Sensitive     TETRACYCLINE <=1 SENSITIVE Sensitive     VANCOMYCIN 1 SENSITIVE Sensitive     TRIMETH/SULFA <=10 SENSITIVE Sensitive     CLINDAMYCIN RESISTANT Resistant     RIFAMPIN <=0.5 SENSITIVE Sensitive     Inducible Clindamycin POSITIVE Resistant     * STAPHYLOCOCCUS AUREUS  Blood culture (routine x 2)     Status: Abnormal   Collection Time: 02/20/19 12:33 PM   Specimen: BLOOD  Result Value Ref Range Status   Specimen Description   Final    BLOOD RIGHT ANTECUBITAL Performed at [facility], [address], [place], [place] [address]    Special Requests   Final    BOTTLES DRAWN AEROBIC AND ANAEROBIC Blood Culture adequate volume Performed at [facility], [address] [place], [place] [address]    Culture  Setup Time   Final    GRAM POSITIVE COCCI IN BOTH AEROBIC AND ANAEROBIC BOTTLES CRITICAL RESULT CALLED TO, READ BACK BY AND VERIFIED WITH: Rito Ehrlich AT [id number] 02/21/19 SDR Performed at [facility], [address] [place], [place] [address]    Culture (A)  Final    STAPHYLOCOCCUS AUREUS SUSCEPTIBILITIES PERFORMED ON  PREVIOUS CULTURE WITHIN THE LAST 5 DAYS. Performed at [facility], [place] [address], [place], [place] [address]    Report Status 02/23/2019 FINAL  Final  Blood Culture ID Panel (Reflexed)     Status: Abnormal   Collection Time: 02/20/19 12:33 PM  Result Value Ref Range Status   Enterococcus species NOT DETECTED NOT DETECTED Final   Listeria monocytogenes NOT DETECTED NOT DETECTED Final   Staphylococcus species DETECTED (A) NOT DETECTED Final    Comment: CRITICAL RESULT CALLED TO, READ BACK BY AND VERIFIED WITH: Rito Ehrlich AT [id number] 02/21/19 SDR    Staphylococcus aureus (BCID) DETECTED (A) NOT DETECTED Final    Comment: Methicillin (oxacillin) susceptible Staphylococcus aureus (MSSA). Preferred therapy is anti staphylococcal beta lactam antibiotic (Cefazolin or Nafcillin), unless clinically contraindicated. CRITICAL RESULT CALLED TO, READ BACK BY AND VERIFIED WITH: Rito Ehrlich AT [id number] 02/21/19 SDR    Methicillin resistance NOT DETECTED NOT DETECTED Final   Streptococcus species NOT DETECTED NOT DETECTED Final   Streptococcus agalactiae NOT DETECTED NOT DETECTED Final   Streptococcus pneumoniae NOT DETECTED NOT DETECTED Final   Streptococcus pyogenes NOT DETECTED NOT DETECTED Final   Acinetobacter baumannii NOT DETECTED NOT DETECTED Final   Enterobacteriaceae species NOT DETECTED NOT DETECTED Final   Enterobacter cloacae complex NOT DETECTED NOT DETECTED Final   Escherichia coli NOT DETECTED NOT DETECTED Final   Klebsiella oxytoca NOT DETECTED NOT DETECTED Final   Klebsiella pneumoniae NOT DETECTED NOT DETECTED Final   Proteus species NOT DETECTED NOT DETECTED Final   Serratia marcescens NOT DETECTED NOT DETECTED Final   Haemophilus influenzae NOT DETECTED NOT DETECTED Final   Neisseria  meningitidis NOT DETECTED NOT DETECTED Final   Pseudomonas aeruginosa NOT DETECTED NOT DETECTED Final   Candida albicans NOT DETECTED NOT DETECTED Final   Candida glabrata NOT DETECTED NOT  DETECTED Final   Candida krusei NOT DETECTED NOT DETECTED Final   Candida parapsilosis NOT DETECTED NOT DETECTED Final   Candida tropicalis NOT DETECTED NOT DETECTED Final    Comment: Performed at [facility], [address] [place], [place] [address]  C difficile quick scan w PCR reflex     Status: Abnormal   Collection Time: 02/20/19  6:16 PM   Specimen: STOOL  Result Value Ref Range Status   C Diff antigen POSITIVE (A) NEGATIVE Final   C Diff toxin NEGATIVE NEGATIVE Final   C Diff interpretation Results are indeterminate. See PCR results.  Final    Comment: Performed at [facility], [address] [place], [place] [address]  C. Diff by PCR, Reflexed     Status: None   Collection Time: 02/20/19  6:16 PM  Result Value Ref Range Status   Toxigenic C. Difficile by PCR NEGATIVE NEGATIVE Final    Comment: Patient is colonized with non toxigenic C. difficile. May not need treatment unless significant symptoms are present. Performed at [facility], [address] [place], [place] [address]   GI pathogen panel by PCR, stool     Status: Abnormal   Collection Time: 02/21/19  4:04 PM   Specimen: Stool  Result Value Ref Range Status   Plesiomonas shigelloides NOT DETECTED NOT DETECTED Final   Yersinia enterocolitica NOT DETECTED NOT DETECTED Final   Vibrio NOT DETECTED NOT DETECTED Final   Enteropathogenic E coli DETECTED (A) NOT DETECTED Final   E coli (ETEC) LT/ST NOT DETECTED NOT DETECTED Final   E coli [id number] by PCR Not applicable NOT DETECTED Final   Cryptosporidium by PCR NOT DETECTED NOT DETECTED Final   Entamoeba histolytica NOT DETECTED NOT DETECTED Final   Adenovirus F 40/41 NOT DETECTED NOT DETECTED Final   Norovirus GI/GII NOT DETECTED NOT DETECTED Final   Sapovirus NOT DETECTED NOT DETECTED Final    Comment: (NOTE) Performed At: [facility] [address] [place], [place] [id number] Rush Farmer MD [id number]    Vibrio  cholerae NOT DETECTED NOT DETECTED Final   Campylobacter by PCR NOT DETECTED NOT DETECTED Final   Salmonella by PCR NOT DETECTED NOT DETECTED Final   E coli (STEC) NOT DETECTED NOT DETECTED Final   Enteroaggregative E coli NOT DETECTED NOT DETECTED Final   Shigella by PCR NOT DETECTED NOT DETECTED Final   Cyclospora cayetanensis NOT DETECTED NOT DETECTED Final   Astrovirus NOT DETECTED NOT DETECTED Final   G lamblia by PCR NOT DETECTED NOT DETECTED Final   Rotavirus A by PCR NOT DETECTED NOT DETECTED Final  CULTURE, BLOOD (ROUTINE X 2) w Reflex to ID Panel     Status: None   Collection Time: 02/22/19 12:59 AM   Specimen: BLOOD  Result Value Ref Range Status   Specimen Description BLOOD RIGHT ANTECUBITAL  Final   Special Requests   Final    BOTTLES DRAWN AEROBIC ONLY Blood Culture adequate volume   Culture   Final    NO GROWTH 5 DAYS Performed at [facility], [address] [place], [place] [address]    Report Status 02/27/2019 FINAL  Final  CULTURE, BLOOD (ROUTINE X 2) w Reflex to ID Panel     Status: None   Collection Time: 02/22/19 12:59 AM  Specimen: BLOOD  Result Value Ref Range Status   Specimen Description BLOOD BLOOD RIGHT HAND  Final   Special Requests   Final    BOTTLES DRAWN AEROBIC AND ANAEROBIC Blood Culture adequate volume   Culture   Final    NO GROWTH 5 DAYS Performed at [facility], [address], [place], [place] [address]    Report Status 02/27/2019 FINAL  Final  Aerobic Culture (superficial specimen)     Status: None (Preliminary result)   Collection Time: 02/24/19  2:00 PM   Specimen: Wound  Result Value Ref Range Status   Specimen Description   Final    WOUND Performed at [facility], [address] [place], [place] [address]    Special Requests   Final    NONE Performed at [facility], [address] [place], [place] [address]    Gram Stain   Final    RARE WBC PRESENT, PREDOMINANTLY PMN NO  ORGANISMS SEEN    Culture   Final    NO GROWTH 3 DAYS Performed at [facility], [place] [address], [place], [place] [address]    Report Status PENDING  Incomplete  MRSA PCR Screening     Status: None   Collection Time: 02/26/19  4:51 AM   Specimen: Nasal Mucosa; Nasopharyngeal  Result Value Ref Range Status   MRSA by PCR NEGATIVE NEGATIVE Final    Comment:        The GeneXpert MRSA Assay (FDA approved for NASAL specimens only), is one component of a comprehensive MRSA colonization surveillance program. It is not intended to diagnose MRSA infection nor to guide or monitor treatment for MRSA infections. Performed at [facility], [address], [place], [place] [address]      Radiology Studies: [place] EKG SITE RITE  Result Date: 02/27/2019 If [facility] image not attached, placement could not be confirmed due to current cardiac rhythm.   Scheduled Meds: . calcium-vitamin D  2 tablet Oral Daily  . chlorhexidine  15 mL Mouth Rinse BID  . Chlorhexidine Gluconate Cloth  6 each Topical Daily  . loratadine  10 mg Oral Daily  . mouth rinse  15 mL Mouth Rinse q12n4p  . pantoprazole  40 mg Oral BID AC  . sodium chloride flush  10-40 mL Intracatheter Q12H   Continuous Infusions: .  ceFAZolin (ANCEF) IV 2 g (02/27/19 0615)  . dextrose 50 mL/hr at 02/27/19 0452     LOS: 7 days   Time spent: 35 minutes.   Lorella Nimrod, MD [facility] Pager [phone]  If 7PM-7AM, please contact night-coverage [email or web address] Password [facility] 02/27/2019, 1:07 PM   This record has been created using Systems analyst. Errors have been sought and corrected,but may not always be located. Such creation errors do not reflect on the standard of care.

## 2019-02-27 NOTE — Progress Notes (Signed)
Pts HR is elevated, this is not an acute change for her

## 2019-02-27 NOTE — Progress Notes (Signed)
Patient has her cpap unit from home. Appears to be in good working order. No rips or tears noted in hose or mask. Powers on and off. Patient is able to put on and off. Patient states will have husband to bring in distilled water tomorrow.

## 2019-02-27 NOTE — NC FL2 (Signed)
[place] LEVEL OF CARE SCREENING TOOL     IDENTIFICATION  Patient Name: Jasmine Morrow Birthdate: 1945-07-12 Sex: female Admission Date (Current Location): 02/20/2019  [place] and [place] Number:  Engineering geologist and Address:  [facility], [address], [place], [place] [address]      Provider Number: [id number]  Attending Physician Name and Address:  Lorella Nimrod, MD  Relative Name and Phone Number:       Current Level of Care: Hospital Recommended Level of Care: [place] Prior Approval Number:    Date Approved/Denied:   PASRR Number: [id number]  Discharge Plan: SNF    Current Diagnoses: Patient Active Problem List   Diagnosis Date Noted  . SOB (shortness of breath)   . Gastrointestinal hemorrhage   . Sepsis ([place])   . Symptomatic anemia 02/20/2019  . Trimalleolar fracture of ankle, closed, right, initial encounter 02/07/2019  . Multiple lung nodules 03/31/2014  . 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 OSA on CPAP 03/27/2014    Orientation RESPIRATION BLADDER Height & Weight     Self, Time, Situation, Place  Normal Incontinent Weight: 131.8 kg Height:     BEHAVIORAL SYMPTOMS/MOOD NEUROLOGICAL BOWEL NUTRITION STATUS      Continent Diet  AMBULATORY STATUS COMMUNICATION OF NEEDS Skin   Extensive Assist Verbally Surgical wounds                       Personal Care Assistance Level of Assistance  Bathing, Feeding, Dressing Bathing Assistance: Maximum assistance Feeding assistance: Maximum assistance Dressing Assistance: Maximum assistance     Functional Limitations Info  Sight Sight Info: Adequate(glasses) Hearing Info: Adequate      SPECIAL CARE FACTORS FREQUENCY  PT (By licensed PT), OT (By licensed OT)     PT Frequency: 5x week OT Frequency: 5x week            Contractures Contractures Info: Not present    Additional Factors Info  Code Status, Allergies Code Status Info:  Full Allergies Info: Celecoxib,hydrocodone-acetaminophen, Meloxicam, Morphine Sulfate ER Beads, Naproxen, Rofecoxib, Sulfa Antifiotics, Ciprofloxacin, Nichel, Tramadol-acetaminophen           Current Medications (02/27/2019):  This is the current hospital active medication list Current Facility-Administered Medications  Medication Dose Route Frequency Provider Last Rate Last Admin  . acetaminophen (TYLENOL) tablet 650 mg  650 mg Oral Q6H PRN Ottie Glazier, MD   650 mg at 02/25/19 0444  . bisacodyl (DULCOLAX) suppository 10 mg  10 mg Rectal Daily PRN Max Sane, MD      . calcium-vitamin D (OSCAL WITH D) 500-200 MG-UNIT per tablet 2 tablet  2 tablet Oral Daily Max Sane, MD   2 tablet at 02/27/19 0934  . ceFAZolin (ANCEF) IVPB 2g/100 mL premix  2 g Intravenous Q8H Lorella Nimrod, MD 200 mL/hr at 02/27/19 0615 2 g at 02/27/19 0615  . chlorhexidine (PERIDEX) 0.12 % solution 15 mL  15 mL Mouth Rinse BID Ottie Glazier, MD   15 mL at 02/27/19 0934  . Chlorhexidine Gluconate Cloth 2 % PADS 6 each  6 each Topical Daily Bradly Bienenstock, NP   6 each at 02/25/19 [phone]  . dextrose 5 % solution   Intravenous Continuous Flora Lipps, MD 50 mL/hr at 02/27/19 0452 Rate Verify at 02/27/19 0452  . hydroxypropyl methylcellulose / hypromellose (ISOPTO TEARS / GONIOVISC) 2.5 % ophthalmic solution 1 drop  1 drop Both Eyes Q8H PRN Max Sane, MD      .  loratadine (CLARITIN) tablet 10 mg  10 mg Oral Daily Max Sane, MD   10 mg at 02/27/19 0934  . MEDLINE mouth rinse  15 mL Mouth Rinse q12n4p Aleskerov, [place], MD   15 mL at 02/25/19 1400  . ondansetron (ZOFRAN) tablet 8 mg  8 mg Oral Q8H PRN Max Sane, MD      . pantoprazole (PROTONIX) EC tablet 40 mg  40 mg Oral BID AC Flora Lipps, MD   40 mg at 02/27/19 0934  . sodium chloride flush (NS) 0.9 % injection 10-40 mL  10-40 mL Intracatheter Q12H Lorella Nimrod, MD   10 mL at 02/27/19 0936  . sodium chloride flush (NS) 0.9 % injection 10-40 mL  10-40 mL  Intracatheter PRN Lorella Nimrod, MD         Discharge Medications: Please see discharge summary for a list of discharge medications.  Relevant Imaging Results:  Relevant Lab Results:   Additional Information SS#: [id number], CPAP,  Victorino Dike, RN

## 2019-02-27 NOTE — Progress Notes (Signed)
Short leg cast applied, she is TTWB for PT

## 2019-02-27 NOTE — Progress Notes (Signed)
Peripherally Inserted Central Catheter/Midline Placement  The IV Nurse has discussed with the patient and/or persons authorized to consent for the patient, the purpose of this procedure and the potential benefits and risks involved with this procedure.  The benefits include less needle sticks, lab draws from the catheter, and the patient may be discharged home with the catheter. Risks include, but not limited to, infection, bleeding, blood clot (thrombus formation), and puncture of an artery; nerve damage and irregular heartbeat and possibility to perform a PICC exchange if needed/ordered by physician.  Alternatives to this procedure were also discussed.  Bard Power PICC patient education guide, fact sheet on infection prevention and patient information card has been provided to patient /or left at bedside.    PICC/Midline Placement Documentation  PICC Single Lumen 02/27/19 PICC Right Cephalic 42 cm 0 cm (Active)  Indication for Insertion or Continuance of Line Home intravenous therapies (PICC only) 02/27/19 2030  Exposed Catheter (cm) 0 cm 02/27/19 2030  Site Assessment Clean;Dry;Intact 02/27/19 2030  Line Status Blood return noted;Flushed;Saline locked 02/27/19 2030  Dressing Type Transparent;Occlusive;Securing device 02/27/19 2030  Dressing Status Clean;Dry;Intact;Antimicrobial disc in place 02/27/19 2030  Line Adjustment (NICU/IV Team Only) No 02/27/19 2030  Dressing Intervention New dressing 02/27/19 2030  Dressing Change Due 03/06/19 02/27/19 2030       Edson Snowball 02/27/2019, 8:51 PM

## 2019-02-27 NOTE — TOC Progression Note (Signed)
Transition of Care [facility]) - Progression Note    Patient Details  Name: ZUZU EGLE MRN: [id number] Date of Birth: 29-Jul-1945  Transition of Care [facility]) CM/SW [place], RN Phone Number: 02/27/2019, 2:13 PM  Clinical Narrative:     At this time, spouse refuses [facility], [employer], [employer], [employer] [facility] [place], [facility],    Expected Discharge Plan: [place] Barriers to Discharge: Continued Medical Work up  Expected Discharge Plan and Services Expected Discharge Plan: [place]   Discharge Planning Services: CM Consult   Living arrangements for the past 2 months: [place]                                       Social Determinants of Health (SDOH) Interventions    Readmission Risk Interventions No flowsheet data found.

## 2019-02-27 NOTE — TOC Progression Note (Signed)
Transition of Care [facility]) - Progression Note    Patient Details  Name: SAMAURI ROCKENBACH MRN: [id number] Date of Birth: [birth year]  Transition of Care [facility]) CM/SW [place], RN Phone Number: 02/27/2019, 3:04 PM  Clinical Narrative:     Just witnessed, patient's husband yelling at his wife when she states it is ok for me to help with bed search.  Husband becomes agitated and states, " I am trying to split them and that his wife can't make decisions.  That he has to make them."  Patient becomes visibly upset and tearful and apologetic for her husbands actions.  She then tries to protect me verbally to her husband that I am there to help.  He states, "Those people aren't important they are just social workers."  I explained I am a Transport planner and my partner is a Education officer, museum and we are part of Transitions team to help locate the next level of care.  Husband continues to express he wants to find placement and it will happen when he decides.  I then asked patient if I could could try and locate a SNF bed as back up to her husband locating one, in case she is set for discharge before he finds one. She is agreeable to this.     Expected Discharge Plan: [place] Barriers to Discharge: Continued Medical Work up  Expected Discharge Plan and Services Expected Discharge Plan: [place]   Discharge Planning Services: CM Consult   Living arrangements for the past 2 months: [place]                                       Social Determinants of Health (SDOH) Interventions    Readmission Risk Interventions No flowsheet data found.

## 2019-02-27 NOTE — Progress Notes (Signed)
1330:Patients husband at beside discussing previous complications in care at facilites, pt became visually  upset and anxious asking him to stop 1430: called pts son to inform that this behavior is effecting patients care/ treatment, patients son states he will try and talk to his father about not discussing prior incidents in front of her.

## 2019-02-27 NOTE — Progress Notes (Signed)
Received order for PICC  

## 2019-02-27 NOTE — TOC Initial Note (Signed)
Transition of Care [facility]) - Initial/Assessment Note    Patient Details  Name: Jasmine Morrow MRN: [id number] Date of Birth: 1945-08-20  Transition of Care [facility]) CM/SW Contact:    Victorino Dike, RN Phone Number: 02/27/2019, 2:02 PM  Clinical Narrative:       Met with patient and spouse today to discuss discharge planning.  Patient reports not wanting to return to [facility].  She is agreeable to me looking for another bed offer.  Her husband was in room, he is still expressing his displeasure with his wife's healthcare team and decisions and feels he should be more involved.  He reported he does not feel his wife can remember or make good decisions.  This appeared to cause some frustration for patient as she told him, "I am not a child!"  Continued to discuss care with both patient and spouse.  Patient is agreeable to bed search for SNF rehab and skilled nursing as patient will be on long term antibiotics.  Husband is having difficulty making decisions in facilities, stating he has "clout" and will find a place.  He was not able to name a place with a bed offer.  I explained that I am here to help and I provided him with a list to all skilled nursing facilities within a 50 mile radius provided by [email or web address].  I asked him to look at list and I would be happy to reach out to the admissions team and send the appropriate information they need to determine if they can offer a bed with patient's skilled needs at discharge.  Will follow-up with patient and spouse later today and continue to follow for new needs.    Spouse also expressed concerns of the DME he received during last hospitalization, I have notified [employer] and their representative will be following up with him.              Expected Discharge Plan: Skilled Nursing Facility Barriers to Discharge: Continued Medical Work up   Patient Goals and CMS Choice        Expected Discharge Plan and Services Expected Discharge  Plan: [place]   Discharge Planning Services: CM Consult   Living arrangements for the past 2 months: [place]                                      Prior Living Arrangements/Services Living arrangements for the past 2 months: [place] Lives with:: Spouse Patient language and need for interpreter reviewed:: Yes        Need for Family Participation in Patient Care: No (Comment) Care giver support system in place?: Yes (comment) Current home services: DME([facility]) Criminal Activity/Legal Involvement Pertinent to Current Situation/Hospitalization: No - Comment as needed  Activities of Daily Living      Permission Sought/Granted                  Emotional Assessment Appearance:: Appears stated age Attitude/Demeanor/Rapport: Engaged Affect (typically observed): Appropriate Orientation: : Oriented to Self, Oriented to Place, Oriented to  Time, Oriented to Situation Alcohol / Substance Use: Not Applicable Psych Involvement: No (comment)  Admission diagnosis:  SOB (shortness of breath) [R06.02] Anemia [D64.9] Symptomatic anemia [D64.9] Gastrointestinal hemorrhage, unspecified gastrointestinal hemorrhage type [K92.2] Sepsis, due to unspecified organism, unspecified whether acute organ dysfunction present [facility]) [A41.9] Patient Active Problem List   Diagnosis Date Noted  .  SOB (shortness of breath)   . Gastrointestinal hemorrhage   . Sepsis ([place])   . Symptomatic anemia 02/20/2019  . Trimalleolar fracture of ankle, closed, right, initial encounter 02/07/2019  . Multiple lung nodules 03/31/2014  . 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 OSA on CPAP 03/27/2014   PCP:  Derinda Late, MD Pharmacy:   [facility] [address], [place] [address] Phone: [phone] Fax: [phone]  [place], [place] [address] [address] Phone: [phone] Fax: [place], [place] [address] Phone: [phone] Fax: [phone]  [place], [place] [address] Phone: [phone] Fax: [phone]     Social Determinants of Health (SDOH) Interventions    Readmission Risk Interventions No flowsheet data found.

## 2019-02-27 NOTE — TOC Progression Note (Signed)
Transition of Care [facility]) - Progression Note    Patient Details  Name: Jasmine Morrow MRN: [id number] Date of Birth: Mar 26, 1945  Transition of Care [facility]) CM/SW [place], RN Phone Number: 02/27/2019, 3:56 PM  Clinical Narrative:      Telephone Conversation:  Very pleasant conversation with son Darnelle Maffucci who is concerned for his mother and father.  He expresses his Dads concern for losing his partner of 97 years.  This is very understandable.  Darnelle Maffucci also understands his mother is going to need skilled therapy to continue to work towards coming home.  He is in agreement with his mother for me to do a bed search for skilled therapy.    Expected Discharge Plan: [place] Barriers to Discharge: Continued Medical Work up  Expected Discharge Plan and Services Expected Discharge Plan: [place]   Discharge Planning Services: CM Consult   Living arrangements for the past 2 months: [place]                                       Social Determinants of Health (SDOH) Interventions    Readmission Risk Interventions No flowsheet data found.

## 2019-02-28 DIAGNOSIS — L8990 Pressure ulcer of unspecified site, unspecified stage: Secondary | ICD-10-CM | POA: Insufficient documentation

## 2019-02-28 LAB — BASIC METABOLIC PANEL
Anion gap: 5 (ref 5–15)
BUN: 17 mg/dL (ref 8–23)
CO2: 23 mmol/L (ref 22–32)
Calcium: 8.6 mg/dL — ABNORMAL LOW (ref 8.9–10.3)
Chloride: 111 mmol/L (ref 98–111)
Creatinine, Ser: 0.56 mg/dL (ref 0.44–1.00)
GFR calc Af Amer: >60 mL/min (ref >60)
GFR calc non Af Amer: >60 mL/min (ref >60)
Glucose, Bld: 95 mg/dL (ref 70–99)
Potassium: 3.4 mmol/L — ABNORMAL LOW (ref 3.5–5.1)
Sodium: 139 mmol/L (ref 135–145)

## 2019-02-28 LAB — AEROBIC CULTURE W GRAM STAIN (SUPERFICIAL SPECIMEN): Culture: NO GROWTH

## 2019-02-28 LAB — HEMOGLOBIN AND HEMATOCRIT, BLOOD
HCT: 24.6 % — ABNORMAL LOW (ref 36.0–46.0)
HCT: 27.6 % — ABNORMAL LOW (ref 36.0–46.0)
Hemoglobin: 7.4 g/dL — ABNORMAL LOW (ref 12.0–15.0)
Hemoglobin: 8.4 g/dL — ABNORMAL LOW (ref 12.0–15.0)

## 2019-02-28 LAB — CBC
HCT: 22.8 % — ABNORMAL LOW (ref 36.0–46.0)
Hemoglobin: 6.9 g/dL — ABNORMAL LOW (ref 12.0–15.0)
MCH: 27.8 pg (ref 26.0–34.0)
MCHC: 30.3 g/dL (ref 30.0–36.0)
MCV: 91.9 fL (ref 80.0–100.0)
Platelets: 202 10*3/uL (ref 150–400)
RBC: 2.48 MIL/uL — ABNORMAL LOW (ref 3.87–5.11)
RDW: 16.9 % — ABNORMAL HIGH (ref 11.5–15.5)
WBC: 5.3 10*3/uL (ref 4.0–10.5)
nRBC: 0 % (ref 0.0–0.2)

## 2019-02-28 LAB — MAGNESIUM: Magnesium: 1.5 mg/dL — ABNORMAL LOW (ref 1.7–2.4)

## 2019-02-28 MED ORDER — SODIUM CHLORIDE 0.9% IV SOLUTION: Freq: Once | INTRAVENOUS | Status: AC

## 2019-02-28 MED ORDER — POTASSIUM CHLORIDE CRYS ER 20 MEQ PO TBCR
40.0000 meq | EXTENDED_RELEASE_TABLET | Freq: Once | ORAL | Status: AC
  Administered 2019-02-28: 13:00:00 40 meq via ORAL
  Filled 2019-02-28: qty 2

## 2019-02-28 NOTE — Progress Notes (Signed)
Physical Therapy Treatment Patient Details Name: Jasmine Morrow MRN: [id number] DOB: 1945/07/07 Today's Date: 02/28/2019    History of Present Illness Pt is 74 y.o. female with a known history of recent ankle fracture and ORIF 02/08/2019. Admitted from from [facility] for chest pain. Found to have hypotension and anemia secondary to melena. Admitted for septic/hemorrhagic shock initially requiring pressor support. She was then intubated due to worsening mental status and inability to protect airway.  Extubated on 02/24/19. Now off the pressors.  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  No more melena or hematemesis. Has had total of 6 units of PRBC and 1 unit of fresh frozen plasma.    PT Comments    Pt was long sitting in bed upon arriving. She was alert and oriented x 4 this date and able to recall proper wt bearing (TTWB) on R LE. Agrees to PT session and bed exercises. Did not perform OOB activity 2/2 to Hgb 6.9 and awaiting transfusion later this date. Pt performed and tolerated there ex in bed without c/o pain.( see exercises performed below). PT will continue to follow pt per POC and continue to progress pt as able. Pt was resting in bed with call bell in reach, bed alarm set, and RLE elevated.    Follow Up Recommendations  SNF     Equipment Recommendations  None recommended by PT    Recommendations for Other Services       Precautions / Restrictions Precautions Precautions: Fall Precaution Comments: R ankle cast Required Braces or Orthoses: Splint/Cast Splint/Cast: R foot/ankle cast Restrictions Weight Bearing Restrictions: Yes RLE Weight Bearing: Touchdown weight bearing    Mobility  Bed Mobility                  Transfers                    Ambulation/Gait                 Stairs             Wheelchair Mobility    Modified Rankin (Stroke Patients Only)       Balance                                             Cognition Arousal/Alertness: Awake/alert Behavior During Therapy: WFL for tasks assessed/performed Overall Cognitive Status: Within Functional Limits for tasks assessed                                 General Comments: Pt was alert and oriented this date. pleasant and cooperative.      Exercises General Exercises - Lower Extremity Ankle Circles/Pumps: AROM;Left;10 reps;Supine Ankle Circles/Pumps Limitations: manual resistance at Left ankle Quad Sets: Both;10 reps;Supine Gluteal Sets: AROM;10 reps;Supine Short Arc Quad: AAROM;Both;10 reps;Supine Heel Slides: AAROM;Both;10 reps;Supine Hip ABduction/ADduction: AAROM;Both;10 reps;Supine Straight Leg Raises: AAROM;Both;10 reps;Supine    General Comments        Pertinent Vitals/Pain Pain Assessment: No/denies pain Pain Score: 0-No pain Faces Pain Scale: No hurt Pain Location: RLEwith movement Pain Descriptors / Indicators: Aching Pain Intervention(s): Limited activity within patient's tolerance    Home Living  Prior Function            PT Goals (current goals can now be found in the care plan section) Progress towards PT goals: Progressing toward goals    Frequency    7X/week      PT Plan Current plan remains appropriate    Co-evaluation     PT goals addressed during session: Strengthening/ROM        AM-PAC PT "6 Clicks" Mobility   Outcome Measure  Help needed turning from your back to your side while in a flat bed without using bedrails?: Total Help needed moving from lying on your back to sitting on the side of a flat bed without using bedrails?: Total Help needed moving to and from a bed to a chair (including a wheelchair)?: Total Help needed standing up from a chair using your arms (e.g., wheelchair or bedside chair)?: Total Help needed to walk in hospital room?: Total Help needed climbing 3-5 steps with a railing? : Total 6  Click Score: 6    End of Session   Activity Tolerance: Patient tolerated treatment well;No increased pain Patient left: in bed;with bed alarm set;with call bell/phone within reach Nurse Communication: Mobility status PT Visit Diagnosis: Muscle weakness (generalized) (M62.81);History of falling (Z91.81);Difficulty in walking, not elsewhere classified (R26.2);Pain     Time: [id number] PT Time Calculation (min) (ACUTE ONLY): 15 min  Charges:  $Therapeutic Exercise: 8-22 mins                     Julaine Fusi PTA 02/28/19, 11:47 AM

## 2019-02-28 NOTE — Progress Notes (Signed)
PROGRESS NOTE  PCCM transfer  Jasmine Morrow  [id number] DOB: 08-04-1945 DOA: 02/20/2019 PCP: Derinda Late, MD   Brief Narrative:  Jasmine Morrow  is a 74 y.o. female with a known history of recent ankle fracture on 1/7 who comes from [place] for chest pain. Found to have hypotension and anemia secondary to melena. Admitted for septic/hemorrhagic shock initially requiring pressor support. She was then intubated due to worsening mental status and inability to protect airway.  Extubated on 02/24/19. Now off the pressors.  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  No more melena or hematemesis.  See if total of 6 units of PRBC and 1 unit of fresh frozen plasma.  Subjective: Patient was feeling little weak when seen this morning.  No new complaints.  Denies any abdominal pain, nausea or vomiting.  Assessment & Plan:   Active Problems:   Symptomatic anemia   SOB (shortness of breath)   Gastrointestinal hemorrhage   Sepsis (HCC)   Pressure injury of skin  Septic/hemorrhagic shock.  Resolved. Most likely secondary to diverticular bleed.  Patient underwent EGD and colonoscopy along with RBC tagged red cell scan which was negative for any active bleeding.  She received multiple blood transfusions, total of 6 PRBC and 1 unit of FFP. Hemoglobin dropped again today to 6.9 without any obvious bleeding.  Rectal tube was pulled out yesterday with dark-colored liquidy stool.  No nursing concern about melena or hematochezia at this time.  Had 1 BM since morning which was appeared to be normal. -Asked GI again-recommending repeating a RBC tagged  cell scan if there is any obvious bleeding and consult vascular if needed. -2 units of packed RBCs ordered. -Continue with PPI. -Continue to monitor-transfuse if below 7. -PT/OT evaluation-commending SNF placement. Patient came from [employer], [employer] [employer] working on placement.  Apparently having some conflict with  husband who wants to decide everything on behalf of patient.  MSSA bacteremia. Blood cultures growing MSSA, most likely her surgical site is the source. TTE and TEE both were negative for any endocarditis. Antibiotics were deescalated to cefazolin-will need 4 weeks of cefazolin.  End date 03/29/2019. -PICC line were placed. -ID is following-appreciate their recommendations.  AKI.  Resolved now, most likely secondary to shock. -Avoid nephrotoxic. -Continue to monitor.  Hypokalemia.  Potassium at 3.4. -Check magnesium-it was normal few days ago. -Replete electrolyte. -Monitor  Right ankle fracture s/p recent ORIF -Ortho is following.  Currently no sign of infection at the surgical site.  Objective: Vitals:   02/27/19 2106 02/27/19 2115 02/28/19 0429 02/28/19 0803  BP: (!) 134/50  110/63 121/60  Pulse: 86 99 91 91  Resp: 20  20 16   Temp: 98.4 F (36.9 C)  (!) 97.3 F (36.3 C) 98.3 F (36.8 C)  TempSrc: Oral  Oral Oral  SpO2: (!) 84% 100% 97% 100%  Weight:   (!) 142.4 kg     Intake/Output Summary (Last 24 hours) at 02/28/2019 1158 Last data filed at 02/28/2019 0028 Gross per 24 hour  Intake 375.19 ml  Output 800 ml  Net -424.81 ml   Filed Weights   02/22/19 2100 02/28/19 0429  Weight: 131.8 kg (!) 142.4 kg    Examination:  General exam: Chronically ill-appearing lady, appears calm and comfortable  Respiratory system: Clear to auscultation. Respiratory effort normal. Cardiovascular system: S1 & S2 heard, RRR. No JVD, murmurs, rubs, gallops or clicks. Gastrointestinal system: Soft, nontender, nondistended, bowel sounds positive. Central nervous system: Alert  and oriented. No focal neurological deficits.Symmetric 5 x 5 power. Extremities: Right ankle with Ace wrap, no edema, no cyanosis, pulses intact  Skin: No rashes, lesions or ulcers Psychiatry: Judgement and insight appear normal. Mood & affect appropriate.    DVT prophylaxis: SCDs Code Status: Full Family  Communication: No family at bedside. Disposition Plan: PT/OT recommending SNF placement, waiting for bed availability.  Medically stable now.  Consultants:   PCCM  ID  Orthopedic  Procedures:  Antimicrobials:  Cefazolin  Data Reviewed: I have personally reviewed following labs and imaging studies  CBC: Recent Labs  Lab 02/22/19 0100 02/22/19 0917 02/23/19 0540 02/23/19 0907 02/24/19 0300 02/25/19 0411 02/26/19 0620 02/27/19 0541 02/28/19 0703  WBC 25.9*  --  21.3*   < > 9.9 8.8 7.9 8.4 5.3  NEUTROABS 22.3*  --  16.6*  --  8.1* 7.1 5.8  --   --   HGB 7.2*   < > 8.3*   < > 7.4* 7.5* 7.9* 8.1* 6.9*  HCT 22.2*   < > 26.6*   < > 23.7* 24.8* 26.6* 25.5* 22.8*  MCV 89.9  --  93.0   < > 91.9 92.5 93.7 89.2 91.9  PLT 595*  --  514*   < > 345 303 258 207 202   < > = values in this interval not displayed.   Basic Metabolic Panel: Recent Labs  Lab 02/24/19 0300 02/24/19 0300 02/24/19 2059 02/25/19 0411 02/26/19 0620 02/27/19 0541 02/28/19 0703  NA 146*  --   --  148* 143 142 139  K 2.7*   < > 3.4* 3.6 3.3* 3.7 3.4*  CL 115*  --   --  120* 114* 115* 111  CO2 23  --   --  21* 23 20* 23  GLUCOSE 96  --   --  97 99 91 95  BUN 44*  --   --  35* 27* 22 17  CREATININE 1.01*  --   --  0.91 0.89 0.66 0.56  CALCIUM 8.7*  --   --  8.9 9.1 8.7* 8.6*  MG 2.3  --   --   --  2.1  --   --   PHOS 2.6  --   --   --  2.5  --   --    < > = values in this interval not displayed.   GFR: Estimated Creatinine Clearance: 95.6 mL/min (by C-G formula based on SCr of 0.56 mg/dL). Liver Function Tests: Recent Labs  Lab 02/25/19 0411  AST 28  ALT 6  ALKPHOS 84  BILITOT 0.8  PROT 5.1*  ALBUMIN 2.2*   No results for input(s): LIPASE, AMYLASE in the last 168 hours. No results for input(s): AMMONIA in the last 168 hours. Coagulation Profile: No results for input(s): INR, PROTIME in the last 168 hours. Cardiac Enzymes: No results for input(s): CKTOTAL, CKMB, CKMBINDEX, TROPONINI in the  last 168 hours. BNP (last 3 results) No results for input(s): PROBNP in the last 8760 hours. HbA1C: No results for input(s): HGBA1C in the last 72 hours. CBG: Recent Labs  Lab 02/23/19 1153 02/24/19 0413 02/24/19 1127 02/24/19 1625 02/25/19 1145  GLUCAP 98 84 96 92 90   Lipid Profile: No results for input(s): CHOL, HDL, LDLCALC, TRIG, CHOLHDL, LDLDIRECT in the last 72 hours. Thyroid Function Tests: No results for input(s): TSH, T4TOTAL, FREET4, T3FREE, THYROIDAB in the last 72 hours. Anemia Panel: No results for input(s): VITAMINB12, FOLATE, FERRITIN, TIBC, IRON, RETICCTPCT in the  last 72 hours. Sepsis Labs: Recent Labs  Lab 02/22/19 0100  PROCALCITON 1.09    Recent Results (from the past 240 hour(s))  Respiratory Panel by RT PCR (Flu A&B, Covid) - Nasopharyngeal Swab     Status: None   Collection Time: 02/20/19  9:55 AM   Specimen: Nasopharyngeal Swab  Result Value Ref Range Status   SARS Coronavirus 2 by RT PCR NEGATIVE NEGATIVE Final    Comment: (NOTE) SARS-CoV-2 target nucleic acids are NOT DETECTED. The SARS-CoV-2 RNA is generally detectable in upper respiratoy specimens during the acute phase of infection. The lowest concentration of SARS-CoV-2 viral copies this assay can detect is 131 copies/mL. A negative result does not preclude SARS-Cov-2 infection and should not be used as the sole basis for treatment or other patient management decisions. A negative result may occur with  improper specimen collection/handling, submission of specimen other than nasopharyngeal swab, presence of viral mutation(s) within the areas targeted by this assay, and inadequate number of viral copies (<131 copies/mL). A negative result must be combined with clinical observations, patient history, and epidemiological information. The expected result is Negative. Fact Sheet for Patients:  PinkCheek.be Fact Sheet for Healthcare Providers:    GravelBags.it This test is not yet ap proved or cleared by the [place] FDA and  has been authorized for detection and/or diagnosis of SARS-CoV-2 by FDA under an Emergency Use Authorization (EUA). This EUA will remain  in effect (meaning this test can be used) for the duration of the COVID-19 declaration under Section 564(b)(1) of the Act, 21 U.S.C. section 360bbb-3(b)(1), unless the authorization is terminated or revoked sooner.    Influenza A by PCR NEGATIVE NEGATIVE Final   Influenza B by PCR NEGATIVE NEGATIVE Final    Comment: (NOTE) The Xpert Xpress SARS-CoV-2/FLU/RSV assay is intended as an aid in  the diagnosis of influenza from Nasopharyngeal swab specimens and  should not be used as a sole basis for treatment. Nasal washings and  aspirates are unacceptable for Xpert Xpress SARS-CoV-2/FLU/RSV  testing. Fact Sheet for Patients: PinkCheek.be Fact Sheet for Healthcare Providers: GravelBags.it This test is not yet approved or cleared by the [place] FDA and  has been authorized for detection and/or diagnosis of SARS-CoV-2 by  FDA under an Emergency Use Authorization (EUA). This EUA will remain  in effect (meaning this test can be used) for the duration of the  Covid-19 declaration under Section 564(b)(1) of the Act, 21  U.S.C. section 360bbb-3(b)(1), unless the authorization is  terminated or revoked. Performed at [facility], [address] [place], [place] [address]   Urine culture     Status: Abnormal   Collection Time: 02/20/19 10:46 AM   Specimen: Urine, Clean Catch  Result Value Ref Range Status   Specimen Description   Final    URINE, CLEAN CATCH Performed at [facility], [address], [place], [place] [address]    Special Requests   Final    NONE Performed at [facility], [address] [place], [place] [address]    Culture (A)   Final    <10,000 COLONIES/mL INSIGNIFICANT GROWTH Performed at [facility] [address], [place],  [address]    Report Status 02/22/2019 FINAL  Final  Blood culture (routine x 2)     Status: Abnormal   Collection Time: 02/20/19 12:04 PM   Specimen: BLOOD  Result Value Ref Range Status   Specimen Description   Final    BLOOD BLOOD  RIGHT HAND Performed at [facility], [address], [place], [place] [address]    Special Requests   Final    BOTTLES DRAWN AEROBIC AND ANAEROBIC Blood Culture adequate volume Performed at [facility], [address] [place], [place] [address]    Culture  Setup Time   Final    GRAM POSITIVE COCCI IN BOTH AEROBIC AND ANAEROBIC BOTTLES CRITICAL RESULT CALLED TO, READ BACK BY AND VERIFIED WITH: SCOTT HALL AT [id number] 02/21/19 SNG  Performed at [facility], [address] [place], [place] [address]    Culture STAPHYLOCOCCUS AUREUS (A)  Final   Report Status 02/24/2019 FINAL  Final   Organism ID, Bacteria STAPHYLOCOCCUS AUREUS  Final      Susceptibility   Staphylococcus aureus - MIC*    CIPROFLOXACIN <=0.5 SENSITIVE Sensitive     ERYTHROMYCIN >=8 RESISTANT Resistant     GENTAMICIN <=0.5 SENSITIVE Sensitive     OXACILLIN 0.5 SENSITIVE Sensitive     TETRACYCLINE <=1 SENSITIVE Sensitive     VANCOMYCIN 1 SENSITIVE Sensitive     TRIMETH/SULFA <=10 SENSITIVE Sensitive     CLINDAMYCIN RESISTANT Resistant     RIFAMPIN <=0.5 SENSITIVE Sensitive     Inducible Clindamycin POSITIVE Resistant     * STAPHYLOCOCCUS AUREUS  Blood culture (routine x 2)     Status: Abnormal   Collection Time: 02/20/19 12:33 PM   Specimen: BLOOD  Result Value Ref Range Status   Specimen Description   Final    BLOOD RIGHT ANTECUBITAL Performed at [facility], [address] [place], [place] [address]    Special Requests   Final    BOTTLES DRAWN AEROBIC AND ANAEROBIC Blood Culture adequate volume Performed at [facility], [address] [place], [place] [address]    Culture  Setup Time   Final    GRAM POSITIVE COCCI IN BOTH AEROBIC AND ANAEROBIC BOTTLES CRITICAL RESULT CALLED TO, READ BACK BY AND VERIFIED WITH: Rito Ehrlich AT [id number] 02/21/19 SDR Performed at [facility], [address] [place], [place] [address]    Culture (A)  Final    STAPHYLOCOCCUS AUREUS SUSCEPTIBILITIES PERFORMED ON PREVIOUS CULTURE WITHIN THE LAST 5 DAYS. Performed at [facility], [place] [address] [place], [place] [address]    Report Status 02/23/2019 FINAL  Final  Blood Culture ID Panel (Reflexed)     Status: Abnormal   Collection Time: 02/20/19 12:33 PM  Result Value Ref Range Status   Enterococcus species NOT DETECTED NOT DETECTED Final   Listeria monocytogenes NOT DETECTED NOT DETECTED Final   Staphylococcus species DETECTED (A) NOT DETECTED Final    Comment: CRITICAL RESULT CALLED TO, READ BACK BY AND VERIFIED WITH: Rito Ehrlich AT [id number] 02/21/19 SDR    Staphylococcus aureus (BCID) DETECTED (A) NOT DETECTED Final    Comment: Methicillin (oxacillin) susceptible Staphylococcus aureus (MSSA). Preferred therapy is anti staphylococcal beta lactam antibiotic (Cefazolin or Nafcillin), unless clinically contraindicated. CRITICAL RESULT CALLED TO, READ BACK BY AND VERIFIED WITH: Rito Ehrlich AT [id number] 02/21/19 SDR    Methicillin resistance NOT DETECTED NOT DETECTED Final   Streptococcus species NOT DETECTED NOT DETECTED Final   Streptococcus agalactiae NOT DETECTED NOT DETECTED Final   Streptococcus pneumoniae NOT DETECTED NOT DETECTED Final   Streptococcus pyogenes NOT DETECTED NOT DETECTED Final   Acinetobacter baumannii NOT DETECTED NOT DETECTED Final   Enterobacteriaceae species NOT DETECTED NOT DETECTED Final   Enterobacter cloacae complex NOT DETECTED NOT DETECTED Final  Escherichia coli NOT DETECTED NOT DETECTED Final   Klebsiella oxytoca NOT DETECTED NOT DETECTED Final   Klebsiella  pneumoniae NOT DETECTED NOT DETECTED Final   Proteus species NOT DETECTED NOT DETECTED Final   Serratia marcescens NOT DETECTED NOT DETECTED Final   Haemophilus influenzae NOT DETECTED NOT DETECTED Final   Neisseria meningitidis NOT DETECTED NOT DETECTED Final   Pseudomonas aeruginosa NOT DETECTED NOT DETECTED Final   Candida albicans NOT DETECTED NOT DETECTED Final   Candida glabrata NOT DETECTED NOT DETECTED Final   Candida krusei NOT DETECTED NOT DETECTED Final   Candida parapsilosis NOT DETECTED NOT DETECTED Final   Candida tropicalis NOT DETECTED NOT DETECTED Final    Comment: Performed at [facility], [place], [place] [address]  C difficile quick scan w PCR reflex     Status: Abnormal   Collection Time: 02/20/19  6:16 PM   Specimen: STOOL  Result Value Ref Range Status   C Diff antigen POSITIVE (A) NEGATIVE Final   C Diff toxin NEGATIVE NEGATIVE Final   C Diff interpretation Results are indeterminate. See PCR results.  Final    Comment: Performed at [facility], [address] [place], [place] [address]  C. Diff by PCR, Reflexed     Status: None   Collection Time: 02/20/19  6:16 PM  Result Value Ref Range Status   Toxigenic C. Difficile by PCR NEGATIVE NEGATIVE Final    Comment: Patient is colonized with non toxigenic C. difficile. May not need treatment unless significant symptoms are present. Performed at [facility], [address] [place], [place] [address]   GI pathogen panel by PCR, stool     Status: Abnormal   Collection Time: 02/21/19  4:04 PM   Specimen: Stool  Result Value Ref Range Status   Plesiomonas shigelloides NOT DETECTED NOT DETECTED Final   Yersinia enterocolitica NOT DETECTED NOT DETECTED Final   Vibrio NOT DETECTED NOT DETECTED Final   Enteropathogenic E coli DETECTED (A) NOT DETECTED Final   E coli (ETEC) LT/ST NOT DETECTED NOT DETECTED Final   E coli [id number] by PCR Not applicable NOT DETECTED Final    Cryptosporidium by PCR NOT DETECTED NOT DETECTED Final   Entamoeba histolytica NOT DETECTED NOT DETECTED Final   Adenovirus F 40/41 NOT DETECTED NOT DETECTED Final   Norovirus GI/GII NOT DETECTED NOT DETECTED Final   Sapovirus NOT DETECTED NOT DETECTED Final    Comment: (NOTE) Performed At: [facility] [address] [place], [place] [id number] Rush Farmer MD [id number]    Vibrio cholerae NOT DETECTED NOT DETECTED Final   Campylobacter by PCR NOT DETECTED NOT DETECTED Final   Salmonella by PCR NOT DETECTED NOT DETECTED Final   E coli (STEC) NOT DETECTED NOT DETECTED Final   Enteroaggregative E coli NOT DETECTED NOT DETECTED Final   Shigella by PCR NOT DETECTED NOT DETECTED Final   Cyclospora cayetanensis NOT DETECTED NOT DETECTED Final   Astrovirus NOT DETECTED NOT DETECTED Final   G lamblia by PCR NOT DETECTED NOT DETECTED Final   Rotavirus A by PCR NOT DETECTED NOT DETECTED Final  CULTURE, BLOOD (ROUTINE X 2) w Reflex to ID Panel     Status: None   Collection Time: 02/22/19 12:59 AM   Specimen: BLOOD  Result Value Ref Range Status   Specimen Description BLOOD RIGHT ANTECUBITAL  Final   Special Requests   Final    BOTTLES DRAWN AEROBIC ONLY Blood Culture adequate volume   Culture  Final    NO GROWTH 5 DAYS Performed at [facility], [address] [place], [place] [address]    Report Status 02/27/2019 FINAL  Final  CULTURE, BLOOD (ROUTINE X 2) w Reflex to ID Panel     Status: None   Collection Time: 02/22/19 12:59 AM   Specimen: BLOOD  Result Value Ref Range Status   Specimen Description BLOOD BLOOD RIGHT HAND  Final   Special Requests   Final    BOTTLES DRAWN AEROBIC AND ANAEROBIC Blood Culture adequate volume   Culture   Final    NO GROWTH 5 DAYS Performed at [facility], [address], [place], [place] [address]    Report Status 02/27/2019 FINAL  Final  Aerobic Culture (superficial specimen)     Status: None   Collection Time:  02/24/19  2:00 PM   Specimen: Wound  Result Value Ref Range Status   Specimen Description   Final    WOUND Performed at [facility], [address] [place], [place] [address]    Special Requests   Final    NONE Performed at [facility], [address] [place], [place] [address]    Gram Stain   Final    RARE WBC PRESENT, PREDOMINANTLY PMN NO ORGANISMS SEEN    Culture   Final    NO GROWTH 3 DAYS Performed at [facility], [address], [place], [place] [address]    Report Status 02/28/2019 FINAL  Final  MRSA PCR Screening     Status: None   Collection Time: 02/26/19  4:51 AM   Specimen: Nasal Mucosa; Nasopharyngeal  Result Value Ref Range Status   MRSA by PCR NEGATIVE NEGATIVE Final    Comment:        The GeneXpert MRSA Assay (FDA approved for NASAL specimens only), is one component of a comprehensive MRSA colonization surveillance program. It is not intended to diagnose MRSA infection nor to guide or monitor treatment for MRSA infections. Performed at [facility], [address], [place], [place] [address]      Radiology Studies: ECHO TEE  Result Date: 02/27/2019   TRANSESOPHOGEAL ECHO REPORT   Patient Name:   TORRIA GREALISH Date of Exam: 02/26/2019 Medical Rec #:  [id number]       Height:       68.0 in Accession #:    [id number]      Weight:       290.6 lb Date of Birth:  Feb 04, 1945       BSA:          2.40 m Patient Age:    32 years        BP:           134/92 mmHg Patient Gender: F               HR:           77 bpm. Exam Location:  [facility]  Procedure: Transesophageal Echo, Color Doppler, Cardiac Doppler and Saline            Contrast Bubble Study Indications:     None Listed  History:         Patient has prior history of Echocardiogram examinations, most                  recent 02/22/2019.  Sonographer:     Sherrie Sport RDCS (AE) Referring Phys:  [place] Diagnosing Phys: Serafina Royals  MD  PROCEDURE: The transesophogeal  probe was passed through the esophogus of the patient. The patient developed no complications during the procedure. IMPRESSIONS  1. Left ventricular ejection fraction, by visual estimation, is 60 to 65%. The left ventricle has normal function. There is no left ventricular hypertrophy.  2. The left ventricle has no regional wall motion abnormalities.  3. Global right ventricle has normal systolic function.The right ventricular size is normal. No increase in right ventricular wall thickness.  4. Left atrial size was normal.  5. Right atrial size was normal.  6. The mitral valve is normal in structure. Trivial mitral valve regurgitation.  7. No trisuspid valve vegetation visualized.  8. The tricuspid valve is normal in structure.  9. The tricuspid valve is normal in structure. Tricuspid valve regurgitation is trivial. 10. The aortic valve is normal in structure. Aortic valve regurgitation is trivial. 11. The pulmonic valve was grossly normal. Pulmonic valve regurgitation is trivial. 12. Evidence of atrial level shunting detected by color flow Doppler. FINDINGS  Left Ventricle: Left ventricular ejection fraction, by visual estimation, is 60 to 65%. The left ventricle has normal function. The left ventricle has no regional wall motion abnormalities. There is no left ventricular hypertrophy. Right Ventricle: The right ventricular size is normal. No increase in right ventricular wall thickness. Global RV systolic function is has normal systolic function. Left Atrium: Left atrial size was normal in size. Right Atrium: Right atrial size was normal in size Pericardium: There is no evidence of pericardial effusion. Mitral Valve: The mitral valve is normal in structure. Trivial mitral valve regurgitation. There is no evidence of mitral valve vegetation. Tricuspid Valve: The tricuspid valve is normal in structure. Tricuspid valve regurgitation is trivial. No TV vegetation was visualized. Aortic Valve: The aortic valve is normal  in structure. Aortic valve regurgitation is trivial. Pulmonic Valve: The pulmonic valve was grossly normal. Pulmonic valve regurgitation is trivial. Aorta: The aortic root, ascending aorta and aortic arch are all structurally normal, with no evidence of dilitation or obstruction. Shunts: Agitated saline contrast was given intravenously to evaluate for intracardiac shunting. Saline shunting was observed within 3-6 cardiac cycles suggestive of interatrial shunt. There is no evidence of an atrial septal defect. Evidence of atrial level shunting detected by color flow Doppler.  Serafina Royals MD Electronically signed by Serafina Royals MD Signature Date/Time: 02/27/2019/5:14:57 PM    Final    [place] EKG SITE RITE  Result Date: 02/27/2019 If Site Rite image not attached, placement could not be confirmed due to current cardiac rhythm.   Scheduled Meds: . sodium chloride   Intravenous Once  . calcium-vitamin D  2 tablet Oral Daily  . chlorhexidine  15 mL Mouth Rinse BID  . Chlorhexidine Gluconate Cloth  6 each Topical Daily  . loratadine  10 mg Oral Daily  . mouth rinse  15 mL Mouth Rinse q12n4p  . pantoprazole  40 mg Oral BID AC  . sodium chloride flush  10-40 mL Intracatheter Q12H  . sodium chloride flush  10-40 mL Intracatheter Q12H   Continuous Infusions: .  ceFAZolin (ANCEF) IV 2 g (02/28/19 0654)  . dextrose 50 mL/hr at 02/27/19 1600     LOS: 8 days   Time spent: 40 minutes.   Lorella Nimrod, MD [facility] Pager [phone]  If 7PM-7AM, please contact night-coverage [email or web address] Password [facility] 02/28/2019, 11:58 AM   This record has been created using Dragon voice recognition software. Errors have been sought and corrected,but may not always be located.  Such creation errors do not reflect on the standard of care.

## 2019-02-28 NOTE — Progress Notes (Signed)
Jasmine Lame, MD [facility]   [address], [place], [place] [address] Phone: [phone] Fax : [phone]   Subjective: The patient was reported to have a drop in her hemoglobin with a drop from 8.1 yesterday to 6.9.  The patient has had no sign of bleeding and denies any shortness of breath or symptoms of her anemia.  The patient's indices also showed that the white cell count had gone from averaging around 8-5.3 on the same blood draw that showed the patient to have worsening anemia.  The patient has had a bowel movement that the nursing staff states did not appear to have any blood in it nor was it dark.   Objective: Vital signs in last 24 hours: Vitals:   02/27/19 2106 02/27/19 2115 02/28/19 0429 02/28/19 0803  BP: (!) 134/50  110/63 121/60  Pulse: 86 99 91 91  Resp: 20  20 16   Temp: 98.4 F (36.9 C)  (!) 97.3 F (36.3 C) 98.3 F (36.8 C)  TempSrc: Oral  Oral Oral  SpO2: (!) 84% 100% 97% 100%  Weight:   (!) 142.4 kg    Weight change:   Intake/Output Summary (Last 24 hours) at 02/28/2019 1432 Last data filed at 02/28/2019 0028 Gross per 24 hour  Intake 375.19 ml  Output 800 ml  Net -424.81 ml   Exam: General: Patient alert and orientated in no apparent distress laying in bed speaking in full sentences. Extremities: Without cyanosis or clubbing  Lab Results: @LABTEST2 @ Micro Results: Recent Results (from the past 240 hour(s))  Respiratory Panel by RT PCR (Flu A&B, Covid) - Nasopharyngeal Swab     Status: None   Collection Time: 02/20/19  9:55 AM   Specimen: Nasopharyngeal Swab  Result Value Ref Range Status   SARS Coronavirus 2 by RT PCR NEGATIVE NEGATIVE Final    Comment: (NOTE) SARS-CoV-2 target nucleic acids are NOT DETECTED. The SARS-CoV-2 RNA is generally detectable in upper respiratoy specimens during the acute phase of infection. The lowest concentration of SARS-CoV-2 viral copies this assay can detect is 131 copies/mL. A negative result does not  preclude SARS-Cov-2 infection and should not be used as the sole basis for treatment or other patient management decisions. A negative result may occur with  improper specimen collection/handling, submission of specimen other than nasopharyngeal swab, presence of viral mutation(s) within the areas targeted by this assay, and inadequate number of viral copies (<131 copies/mL). A negative result must be combined with clinical observations, patient history, and epidemiological information. The expected result is Negative. Fact Sheet for Patients:  PinkCheek.be Fact Sheet for Healthcare Providers:  GravelBags.it This test is not yet ap proved or cleared by the [place] FDA and  has been authorized for detection and/or diagnosis of SARS-CoV-2 by FDA under an Emergency Use Authorization (EUA). This EUA will remain  in effect (meaning this test can be used) for the duration of the COVID-19 declaration under Section 564(b)(1) of the Act, 21 U.S.C. section 360bbb-3(b)(1), unless the authorization is terminated or revoked sooner.    Influenza A by PCR NEGATIVE NEGATIVE Final   Influenza B by PCR NEGATIVE NEGATIVE Final    Comment: (NOTE) The Xpert Xpress SARS-CoV-2/FLU/RSV assay is intended as an aid in  the diagnosis of influenza from Nasopharyngeal swab specimens and  should not be used as a sole basis for treatment. Nasal washings and  aspirates are unacceptable for Xpert Xpress SARS-CoV-2/FLU/RSV  testing. Fact Sheet for Patients: PinkCheek.be Fact Sheet for Healthcare Providers: GravelBags.it  This test is not yet approved or cleared by the [place] and  has been authorized for detection and/or diagnosis of SARS-CoV-2 by  FDA under an Emergency Use Authorization (EUA). This EUA will remain  in effect (meaning this test can be used) for the duration of the    Covid-19 declaration under Section 564(b)(1) of the Act, 21  U.S.C. section 360bbb-3(b)(1), unless the authorization is  terminated or revoked. Performed at [facility], [address] [place], [place] [address]   Urine culture     Status: Abnormal   Collection Time: 02/20/19 10:46 AM   Specimen: Urine, Clean Catch  Result Value Ref Range Status   Specimen Description   Final    URINE, CLEAN CATCH Performed at [facility], [address], [place], [place] [address]    Special Requests   Final    NONE Performed at [facility], [address], [place], [place] [address]    Culture (A)  Final    <10,000 COLONIES/mL INSIGNIFICANT GROWTH Performed at [facility] [address] [place], [place] [address]    Report Status 02/22/2019 FINAL  Final  Blood culture (routine x 2)     Status: Abnormal   Collection Time: 02/20/19 12:04 PM   Specimen: BLOOD  Result Value Ref Range Status   Specimen Description   Final    BLOOD BLOOD RIGHT HAND Performed at [facility], [address] [place], [place] [address]    Special Requests   Final    BOTTLES DRAWN AEROBIC AND ANAEROBIC Blood Culture adequate volume Performed at [facility], [address] [place], [place] [address]    Culture  Setup Time   Final    GRAM POSITIVE COCCI IN BOTH AEROBIC AND ANAEROBIC BOTTLES CRITICAL RESULT CALLED TO, READ BACK BY AND VERIFIED WITH: SCOTT HALL AT [id number] 02/21/19 SNG  Performed at [facility], [address] [place], [place] [address]    Culture STAPHYLOCOCCUS AUREUS (A)  Final   Report Status 02/24/2019 FINAL  Final   Organism ID, Bacteria STAPHYLOCOCCUS AUREUS  Final      Susceptibility   Staphylococcus aureus - MIC*    CIPROFLOXACIN <=0.5 SENSITIVE Sensitive     ERYTHROMYCIN >=8 RESISTANT Resistant     GENTAMICIN <=0.5 SENSITIVE Sensitive     OXACILLIN 0.5 SENSITIVE Sensitive     TETRACYCLINE <=1 SENSITIVE Sensitive      VANCOMYCIN 1 SENSITIVE Sensitive     TRIMETH/SULFA <=10 SENSITIVE Sensitive     CLINDAMYCIN RESISTANT Resistant     RIFAMPIN <=0.5 SENSITIVE Sensitive     Inducible Clindamycin POSITIVE Resistant     * STAPHYLOCOCCUS AUREUS  Blood culture (routine x 2)     Status: Abnormal   Collection Time: 02/20/19 12:33 PM   Specimen: BLOOD  Result Value Ref Range Status   Specimen Description   Final    BLOOD RIGHT ANTECUBITAL Performed at [facility], [address], [place], [place] [address]    Special Requests   Final    BOTTLES DRAWN AEROBIC AND ANAEROBIC Blood Culture adequate volume Performed at [facility], [address] [place], [place] [address]    Culture  Setup Time   Final    GRAM POSITIVE COCCI IN BOTH AEROBIC AND ANAEROBIC BOTTLES CRITICAL RESULT CALLED TO, READ BACK BY AND VERIFIED WITH: Rito Ehrlich AT [id number] 02/21/19 SDR Performed at [facility], [address] [place],  [address]  Culture (A)  Final    STAPHYLOCOCCUS AUREUS SUSCEPTIBILITIES PERFORMED ON PREVIOUS CULTURE WITHIN THE LAST 5 DAYS. Performed at [facility], [place] [address] [place], [place] [address]    Report Status 02/23/2019 FINAL  Final  Blood Culture ID Panel (Reflexed)     Status: Abnormal   Collection Time: 02/20/19 12:33 PM  Result Value Ref Range Status   Enterococcus species NOT DETECTED NOT DETECTED Final   Listeria monocytogenes NOT DETECTED NOT DETECTED Final   Staphylococcus species DETECTED (A) NOT DETECTED Final    Comment: CRITICAL RESULT CALLED TO, READ BACK BY AND VERIFIED WITH: Rito Ehrlich AT [id number] 02/21/19 SDR    Staphylococcus aureus (BCID) DETECTED (A) NOT DETECTED Final    Comment: Methicillin (oxacillin) susceptible Staphylococcus aureus (MSSA). Preferred therapy is anti staphylococcal beta lactam antibiotic (Cefazolin or Nafcillin), unless clinically contraindicated. CRITICAL RESULT CALLED TO, READ BACK BY AND VERIFIED  WITH: Rito Ehrlich AT [id number] 02/21/19 SDR    Methicillin resistance NOT DETECTED NOT DETECTED Final   Streptococcus species NOT DETECTED NOT DETECTED Final   Streptococcus agalactiae NOT DETECTED NOT DETECTED Final   Streptococcus pneumoniae NOT DETECTED NOT DETECTED Final   Streptococcus pyogenes NOT DETECTED NOT DETECTED Final   Acinetobacter baumannii NOT DETECTED NOT DETECTED Final   Enterobacteriaceae species NOT DETECTED NOT DETECTED Final   Enterobacter cloacae complex NOT DETECTED NOT DETECTED Final   Escherichia coli NOT DETECTED NOT DETECTED Final   Klebsiella oxytoca NOT DETECTED NOT DETECTED Final   Klebsiella pneumoniae NOT DETECTED NOT DETECTED Final   Proteus species NOT DETECTED NOT DETECTED Final   Serratia marcescens NOT DETECTED NOT DETECTED Final   Haemophilus influenzae NOT DETECTED NOT DETECTED Final   Neisseria meningitidis NOT DETECTED NOT DETECTED Final   Pseudomonas aeruginosa NOT DETECTED NOT DETECTED Final   Candida albicans NOT DETECTED NOT DETECTED Final   Candida glabrata NOT DETECTED NOT DETECTED Final   Candida krusei NOT DETECTED NOT DETECTED Final   Candida parapsilosis NOT DETECTED NOT DETECTED Final   Candida tropicalis NOT DETECTED NOT DETECTED Final    Comment: Performed at [facility], [address] [place], [place] [address]  C difficile quick scan w PCR reflex     Status: Abnormal   Collection Time: 02/20/19  6:16 PM   Specimen: STOOL  Result Value Ref Range Status   C Diff antigen POSITIVE (A) NEGATIVE Final   C Diff toxin NEGATIVE NEGATIVE Final   C Diff interpretation Results are indeterminate. See PCR results.  Final    Comment: Performed at [facility], [address] [place], [place] [address]  C. Diff by PCR, Reflexed     Status: None   Collection Time: 02/20/19  6:16 PM  Result Value Ref Range Status   Toxigenic C. Difficile by PCR NEGATIVE NEGATIVE Final    Comment: Patient is colonized with non toxigenic C.  difficile. May not need treatment unless significant symptoms are present. Performed at [facility], [address] [place], [place] [address]   GI pathogen panel by PCR, stool     Status: Abnormal   Collection Time: 02/21/19  4:04 PM   Specimen: Stool  Result Value Ref Range Status   Plesiomonas shigelloides NOT DETECTED NOT DETECTED Final   Yersinia enterocolitica NOT DETECTED NOT DETECTED Final   Vibrio NOT DETECTED NOT DETECTED Final   Enteropathogenic E coli DETECTED (A) NOT DETECTED Final   E coli (ETEC) LT/ST NOT DETECTED NOT DETECTED Final  E coli [id number] by PCR Not applicable NOT DETECTED Final   Cryptosporidium by PCR NOT DETECTED NOT DETECTED Final   Entamoeba histolytica NOT DETECTED NOT DETECTED Final   Adenovirus F 40/41 NOT DETECTED NOT DETECTED Final   Norovirus GI/GII NOT DETECTED NOT DETECTED Final   Sapovirus NOT DETECTED NOT DETECTED Final    Comment: (NOTE) Performed At: [facility] [address] [place], [place] [id number] Rush Farmer MD [id number]    Vibrio cholerae NOT DETECTED NOT DETECTED Final   Campylobacter by PCR NOT DETECTED NOT DETECTED Final   Salmonella by PCR NOT DETECTED NOT DETECTED Final   E coli (STEC) NOT DETECTED NOT DETECTED Final   Enteroaggregative E coli NOT DETECTED NOT DETECTED Final   Shigella by PCR NOT DETECTED NOT DETECTED Final   Cyclospora cayetanensis NOT DETECTED NOT DETECTED Final   Astrovirus NOT DETECTED NOT DETECTED Final   G lamblia by PCR NOT DETECTED NOT DETECTED Final   Rotavirus A by PCR NOT DETECTED NOT DETECTED Final  CULTURE, BLOOD (ROUTINE X 2) w Reflex to ID Panel     Status: None   Collection Time: 02/22/19 12:59 AM   Specimen: BLOOD  Result Value Ref Range Status   Specimen Description BLOOD RIGHT ANTECUBITAL  Final   Special Requests   Final    BOTTLES DRAWN AEROBIC ONLY Blood Culture adequate volume   Culture   Final    NO GROWTH 5 DAYS Performed at [facility], [address] [place], [place] [address]    Report Status 02/27/2019 FINAL  Final  CULTURE, BLOOD (ROUTINE X 2) w Reflex to ID Panel     Status: None   Collection Time: 02/22/19 12:59 AM   Specimen: BLOOD  Result Value Ref Range Status   Specimen Description BLOOD BLOOD RIGHT HAND  Final   Special Requests   Final    BOTTLES DRAWN AEROBIC AND ANAEROBIC Blood Culture adequate volume   Culture   Final    NO GROWTH 5 DAYS Performed at [facility], [address] [place], [place] [address]    Report Status 02/27/2019 FINAL  Final  Aerobic Culture (superficial specimen)     Status: None   Collection Time: 02/24/19  2:00 PM   Specimen: Wound  Result Value Ref Range Status   Specimen Description   Final    WOUND Performed at [facility], [address], [place], [place] [address]    Special Requests   Final    NONE Performed at [facility], [address] [place], [place] [address]    Gram Stain   Final    RARE WBC PRESENT, PREDOMINANTLY PMN NO ORGANISMS SEEN    Culture   Final    NO GROWTH 3 DAYS Performed at [facility], [place] [address], [place], [place] [address]    Report Status 02/28/2019 FINAL  Final  MRSA PCR Screening     Status: None   Collection Time: 02/26/19  4:51 AM   Specimen: Nasal Mucosa; Nasopharyngeal  Result Value Ref Range Status   MRSA by PCR NEGATIVE NEGATIVE Final    Comment:        The GeneXpert MRSA Assay (FDA approved for NASAL specimens only), is one component of a comprehensive MRSA colonization surveillance program. It is not intended to diagnose MRSA infection nor to guide or monitor treatment for MRSA infections. Performed at [facility], [address], [place], [place] [address]  Studies/Results: [place] EKG SITE RITE  Result Date: 02/27/2019 If Site Rite image not attached, placement could not be confirmed due to current cardiac rhythm.  Medications: I have reviewed the patient's  current medications. Scheduled Meds: . sodium chloride   Intravenous Once  . calcium-vitamin D  2 tablet Oral Daily  . chlorhexidine  15 mL Mouth Rinse BID  . Chlorhexidine Gluconate Cloth  6 each Topical Daily  . loratadine  10 mg Oral Daily  . mouth rinse  15 mL Mouth Rinse q12n4p  . pantoprazole  40 mg Oral BID AC  . sodium chloride flush  10-40 mL Intracatheter Q12H  . sodium chloride flush  10-40 mL Intracatheter Q12H   Continuous Infusions: .  ceFAZolin (ANCEF) IV 2 g (02/28/19 1404)  . dextrose 50 mL/hr at 02/27/19 1600   PRN Meds:.acetaminophen, bisacodyl, hydroxypropyl methylcellulose / hypromellose, ondansetron, sodium chloride flush, sodium chloride flush   Assessment: Active Problems:   Symptomatic anemia   SOB (shortness of breath)   Gastrointestinal hemorrhage   Sepsis (HCC)   Pressure injury of skin    Plan: This patient had a drop in her hemoglobin from 8.1-6.9 without any sign of bleeding.  The patient has had blood ordered for transfusion.  I have asked the nursing staff to get a stat hemoglobin since the drop of more than 1 g of hemoglobin should result in some sign of GI bleeding such as black stools or bloody stools.  Since the patient has had none of these I would recommend making sure that the morning blood draw was correct before doing a transfusion on this patient.  If the repeat blood draw shows the anemia then transfusion would be warranted.  The patient has already had a upper endoscopy and colonoscopy without any sign of bleeding except for diverticulosis and it is unlikely that repeating these would give any more information.  Especially since there is no overt sign of any GI bleeding clinically.  The patient and her husband have been explained the plan and agree with it.   LOS: 8 days   Jasmine Morrow 02/28/2019, 2:32 PM Pager [phone] 7am-5pm  Check AMION for 5pm -7am coverage and on weekends

## 2019-02-28 NOTE — Progress Notes (Signed)
Blood transfusion complete, tolerated well.

## 2019-02-28 NOTE — Progress Notes (Signed)
OT Cancellation Note  Patient Details Name: Jasmine Morrow MRN: [id number] DOB: [birth year]   Cancelled Treatment:    Reason Eval/Treat Not Completed: Medical issues which prohibited therapy. Repeat labs indicating Hgb 7.4. Per chart, orders to transfuse 1 unit of PRBC. Will hold OT and re-attempt at later date/time as appropriate.   Jeni Salles, MPH, MS, OTR/L ascom [phone] 02/28/19, 3:17 PM

## 2019-02-28 NOTE — Progress Notes (Signed)
Repeat hbg 7.4- MD made aware/ orders to transfuse 1 unit of PRBC/ will monitor.

## 2019-02-28 NOTE — Progress Notes (Signed)
Per Dr.Wohl, repeat H&H before transfusing blood, will obtain lab and monitor for results.

## 2019-02-28 NOTE — Plan of Care (Signed)
  Problem: Clinical Measurements: Goal: Ability to maintain clinical measurements within normal limits will improve Outcome: Progressing Note: Hemoglobin has improved after transfusion   Problem: Activity: Goal: Risk for activity intolerance will decrease Outcome: Not Progressing Note: Encourage patient to engage in self care

## 2019-02-28 NOTE — Progress Notes (Signed)
Stage 2 pressure ulcer noted to pt;s sacrum/area measure and foam applied. MD made aware. Will start Q2 hour turns/will continue to monitor.

## 2019-03-01 DIAGNOSIS — D649 Anemia, unspecified: Secondary | ICD-10-CM

## 2019-03-01 DIAGNOSIS — S90911A Unspecified superficial injury of right ankle, initial encounter: Secondary | ICD-10-CM

## 2019-03-01 LAB — CBC
HCT: 26.2 % — ABNORMAL LOW (ref 36.0–46.0)
Hemoglobin: 8.2 g/dL — ABNORMAL LOW (ref 12.0–15.0)
MCH: 28.2 pg (ref 26.0–34.0)
MCHC: 31.3 g/dL (ref 30.0–36.0)
MCV: 90 fL (ref 80.0–100.0)
Platelets: 229 10*3/uL (ref 150–400)
RBC: 2.91 MIL/uL — ABNORMAL LOW (ref 3.87–5.11)
RDW: 16.5 % — ABNORMAL HIGH (ref 11.5–15.5)
WBC: 5.4 10*3/uL (ref 4.0–10.5)
nRBC: 0 % (ref 0.0–0.2)

## 2019-03-01 LAB — BASIC METABOLIC PANEL
Anion gap: 5 (ref 5–15)
BUN: 12 mg/dL (ref 8–23)
CO2: 23 mmol/L (ref 22–32)
Calcium: 8.5 mg/dL — ABNORMAL LOW (ref 8.9–10.3)
Chloride: 109 mmol/L (ref 98–111)
Creatinine, Ser: 0.58 mg/dL (ref 0.44–1.00)
GFR calc Af Amer: >60 mL/min (ref >60)
GFR calc non Af Amer: >60 mL/min (ref >60)
Glucose, Bld: 95 mg/dL (ref 70–99)
Potassium: 3.8 mmol/L (ref 3.5–5.1)
Sodium: 137 mmol/L (ref 135–145)

## 2019-03-01 LAB — RESPIRATORY PANEL BY RT PCR (FLU A&B, COVID)
Influenza A by PCR: NEGATIVE
Influenza B by PCR: NEGATIVE
SARS Coronavirus 2 by RT PCR: NEGATIVE

## 2019-03-01 MED ORDER — PANTOPRAZOLE SODIUM 40 MG PO TBEC: 40.0000 mg | DELAYED_RELEASE_TABLET | Freq: Two times a day (BID) | ORAL | Status: DC

## 2019-03-01 MED ORDER — SODIUM CHLORIDE 0.9 % IV SOLN
INTRAVENOUS | Status: DC | PRN
  Administered 2019-03-01: 250 mL via INTRAVENOUS
  Administered 2019-03-02: 15:00:00 500 mL via INTRAVENOUS
  Administered 2019-03-02: 06:00:00 250 mL via INTRAVENOUS
  Administered 2019-03-03 (×2): 500 mL via INTRAVENOUS
  Administered 2019-03-06 (×2): 250 mL via INTRAVENOUS

## 2019-03-01 MED ORDER — ORAL CARE MOUTH RINSE: 15.0000 mL | Freq: Two times a day (BID) | OROMUCOSAL | 0 refills | Status: DC

## 2019-03-01 MED ORDER — CEFAZOLIN IV (FOR PTA / DISCHARGE USE ONLY): 2.0000 g | Freq: Three times a day (TID) | INTRAVENOUS | 0 refills | Status: DC

## 2019-03-01 MED ORDER — CEFAZOLIN SODIUM-DEXTROSE 2-4 GM/100ML-% IV SOLN: 2.0000 g | Freq: Three times a day (TID) | INTRAVENOUS | Status: DC

## 2019-03-01 MED ORDER — LORATADINE 10 MG PO TABS: 10.0000 mg | ORAL_TABLET | Freq: Every day | ORAL | Status: DC

## 2019-03-01 NOTE — Progress Notes (Signed)
Jasmine Lame, MD [facility]   [address], [place], [place] [address] Phone: [phone] Fax : [phone]   Subjective: The patient is feeling well and her hemoglobin has been stable.  The patient is now in the midst of discharge planning to find a place for her to do some rehab.  She has no complaints at the present time and states that she is not having any further signs of bleeding such as black stools or bloody stools.  She also denies any abdominal pain.   Objective: Vital signs in last 24 hours: Vitals:   03/01/19 0419 03/01/19 0812 03/01/19 1624 03/01/19 1955  BP: (!) 114/58 (!) 118/55 (!) 128/56 (!) 129/53  Pulse: (!) 109 97 92 (!) 101  Resp: 20 19 19 20   Temp: 98.2 F (36.8 C) 97.9 F (36.6 C) 97.8 F (36.6 C) 98.3 F (36.8 C)  TempSrc: Oral Oral Oral Oral  SpO2: 100% 100% 100% 100%  Weight: (!) 141.5 kg      Weight change: -0.907 kg  Intake/Output Summary (Last 24 hours) at 03/01/2019 2121 Last data filed at 03/01/2019 1955 Gross per 24 hour  Intake 775 ml  Output 1500 ml  Net -725 ml     Exam: Heart:: Regular rate and rhythm, S1S2 present or without murmur or extra heart sounds Lungs: normal and clear to auscultation and percussion Abdomen: soft, nontender, normal bowel sounds   Lab Results: @LABTEST2 @ Micro Results: Recent Results (from the past 240 hour(s))  Respiratory Panel by RT PCR (Flu A&B, Covid) - Nasopharyngeal Swab     Status: None   Collection Time: 02/20/19  9:55 AM   Specimen: Nasopharyngeal Swab  Result Value Ref Range Status   SARS Coronavirus 2 by RT PCR NEGATIVE NEGATIVE Final    Comment: (NOTE) SARS-CoV-2 target nucleic acids are NOT DETECTED. The SARS-CoV-2 RNA is generally detectable in upper respiratoy specimens during the acute phase of infection. The lowest concentration of SARS-CoV-2 viral copies this assay can detect is 131 copies/mL. A negative result does not preclude SARS-Cov-2 infection and should not be used as  the sole basis for treatment or other patient management decisions. A negative result may occur with  improper specimen collection/handling, submission of specimen other than nasopharyngeal swab, presence of viral mutation(s) within the areas targeted by this assay, and inadequate number of viral copies (<131 copies/mL). A negative result must be combined with clinical observations, patient history, and epidemiological information. The expected result is Negative. Fact Sheet for Patients:  PinkCheek.be Fact Sheet for Healthcare Providers:  GravelBags.it This test is not yet ap proved or cleared by the [place] FDA and  has been authorized for detection and/or diagnosis of SARS-CoV-2 by FDA under an Emergency Use Authorization (EUA). This EUA will remain  in effect (meaning this test can be used) for the duration of the COVID-19 declaration under Section 564(b)(1) of the Act, 21 U.S.C. section 360bbb-3(b)(1), unless the authorization is terminated or revoked sooner.    Influenza A by PCR NEGATIVE NEGATIVE Final   Influenza B by PCR NEGATIVE NEGATIVE Final    Comment: (NOTE) The Xpert Xpress SARS-CoV-2/FLU/RSV assay is intended as an aid in  the diagnosis of influenza from Nasopharyngeal swab specimens and  should not be used as a sole basis for treatment. Nasal washings and  aspirates are unacceptable for Xpert Xpress SARS-CoV-2/FLU/RSV  testing. Fact Sheet for Patients: PinkCheek.be Fact Sheet for Healthcare Providers: GravelBags.it This test is not yet approved or cleared by the [place]  States FDA and  has been authorized for detection and/or diagnosis of SARS-CoV-2 by  FDA under an Emergency Use Authorization (EUA). This EUA will remain  in effect (meaning this test can be used) for the duration of the  Covid-19 declaration under Section 564(b)(1) of the Act, 21    U.S.C. section 360bbb-3(b)(1), unless the authorization is  terminated or revoked. Performed at [facility], [address] [place], [place] [address]   Urine culture     Status: Abnormal   Collection Time: 02/20/19 10:46 AM   Specimen: Urine, Clean Catch  Result Value Ref Range Status   Specimen Description   Final    URINE, CLEAN CATCH Performed at [facility], [address] [place], [place] [address]    Special Requests   Final    NONE Performed at [facility], [address], [place], [place] [address]    Culture (A)  Final    <10,000 COLONIES/mL INSIGNIFICANT GROWTH Performed at [facility] [address], [place], [place] [address]    Report Status 02/22/2019 FINAL  Final  Blood culture (routine x 2)     Status: Abnormal   Collection Time: 02/20/19 12:04 PM   Specimen: BLOOD  Result Value Ref Range Status   Specimen Description   Final    BLOOD BLOOD RIGHT HAND Performed at [facility], [address] [place], [place] [address]    Special Requests   Final    BOTTLES DRAWN AEROBIC AND ANAEROBIC Blood Culture adequate volume Performed at [facility], [address] [place], [place] [address]    Culture  Setup Time   Final    GRAM POSITIVE COCCI IN BOTH AEROBIC AND ANAEROBIC BOTTLES CRITICAL RESULT CALLED TO, READ BACK BY AND VERIFIED WITH: SCOTT HALL AT [id number] 02/21/19 SNG  Performed at [facility], [address] [place], [place] [address]    Culture STAPHYLOCOCCUS AUREUS (A)  Final   Report Status 02/24/2019 FINAL  Final   Organism ID, Bacteria STAPHYLOCOCCUS AUREUS  Final      Susceptibility   Staphylococcus aureus - MIC*    CIPROFLOXACIN <=0.5 SENSITIVE Sensitive     ERYTHROMYCIN >=8 RESISTANT Resistant     GENTAMICIN <=0.5 SENSITIVE Sensitive     OXACILLIN 0.5 SENSITIVE Sensitive     TETRACYCLINE <=1 SENSITIVE Sensitive     VANCOMYCIN 1 SENSITIVE Sensitive     TRIMETH/SULFA <=10  SENSITIVE Sensitive     CLINDAMYCIN RESISTANT Resistant     RIFAMPIN <=0.5 SENSITIVE Sensitive     Inducible Clindamycin POSITIVE Resistant     * STAPHYLOCOCCUS AUREUS  Blood culture (routine x 2)     Status: Abnormal   Collection Time: 02/20/19 12:33 PM   Specimen: BLOOD  Result Value Ref Range Status   Specimen Description   Final    BLOOD RIGHT ANTECUBITAL Performed at [facility], [address] [place], [place] [address]    Special Requests   Final    BOTTLES DRAWN AEROBIC AND ANAEROBIC Blood Culture adequate volume Performed at [facility], [address] [place], [place] [address]    Culture  Setup Time   Final    GRAM POSITIVE COCCI IN BOTH AEROBIC AND ANAEROBIC BOTTLES CRITICAL RESULT CALLED TO, READ BACK BY AND VERIFIED WITH: Rito Ehrlich AT [id number] 02/21/19 SDR Performed at [facility], [address] [place],  [address]    Culture (A)  Final    STAPHYLOCOCCUS AUREUS  SUSCEPTIBILITIES PERFORMED ON PREVIOUS CULTURE WITHIN THE LAST 5 DAYS. Performed at [facility], [place] [address], [place], [place] [address]    Report Status 02/23/2019 FINAL  Final  Blood Culture ID Panel (Reflexed)     Status: Abnormal   Collection Time: 02/20/19 12:33 PM  Result Value Ref Range Status   Enterococcus species NOT DETECTED NOT DETECTED Final   Listeria monocytogenes NOT DETECTED NOT DETECTED Final   Staphylococcus species DETECTED (A) NOT DETECTED Final    Comment: CRITICAL RESULT CALLED TO, READ BACK BY AND VERIFIED WITH: Rito Ehrlich AT [id number] 02/21/19 SDR    Staphylococcus aureus (BCID) DETECTED (A) NOT DETECTED Final    Comment: Methicillin (oxacillin) susceptible Staphylococcus aureus (MSSA). Preferred therapy is anti staphylococcal beta lactam antibiotic (Cefazolin or Nafcillin), unless clinically contraindicated. CRITICAL RESULT CALLED TO, READ BACK BY AND VERIFIED WITH: Rito Ehrlich AT [id number] 02/21/19 SDR    Methicillin resistance NOT  DETECTED NOT DETECTED Final   Streptococcus species NOT DETECTED NOT DETECTED Final   Streptococcus agalactiae NOT DETECTED NOT DETECTED Final   Streptococcus pneumoniae NOT DETECTED NOT DETECTED Final   Streptococcus pyogenes NOT DETECTED NOT DETECTED Final   Acinetobacter baumannii NOT DETECTED NOT DETECTED Final   Enterobacteriaceae species NOT DETECTED NOT DETECTED Final   Enterobacter cloacae complex NOT DETECTED NOT DETECTED Final   Escherichia coli NOT DETECTED NOT DETECTED Final   Klebsiella oxytoca NOT DETECTED NOT DETECTED Final   Klebsiella pneumoniae NOT DETECTED NOT DETECTED Final   Proteus species NOT DETECTED NOT DETECTED Final   Serratia marcescens NOT DETECTED NOT DETECTED Final   Haemophilus influenzae NOT DETECTED NOT DETECTED Final   Neisseria meningitidis NOT DETECTED NOT DETECTED Final   Pseudomonas aeruginosa NOT DETECTED NOT DETECTED Final   Candida albicans NOT DETECTED NOT DETECTED Final   Candida glabrata NOT DETECTED NOT DETECTED Final   Candida krusei NOT DETECTED NOT DETECTED Final   Candida parapsilosis NOT DETECTED NOT DETECTED Final   Candida tropicalis NOT DETECTED NOT DETECTED Final    Comment: Performed at [facility], [address] [place], [place] [address]  C difficile quick scan w PCR reflex     Status: Abnormal   Collection Time: 02/20/19  6:16 PM   Specimen: STOOL  Result Value Ref Range Status   C Diff antigen POSITIVE (A) NEGATIVE Final   C Diff toxin NEGATIVE NEGATIVE Final   C Diff interpretation Results are indeterminate. See PCR results.  Final    Comment: Performed at [facility], [address] [place], [place] [address]  C. Diff by PCR, Reflexed     Status: None   Collection Time: 02/20/19  6:16 PM  Result Value Ref Range Status   Toxigenic C. Difficile by PCR NEGATIVE NEGATIVE Final    Comment: Patient is colonized with non toxigenic C. difficile. May not need treatment unless significant symptoms are  present. Performed at [facility], [address] [place],  [address]   GI pathogen panel by PCR, stool     Status: Abnormal   Collection Time: 02/21/19  4:04 PM   Specimen: Stool  Result Value Ref Range Status   Plesiomonas shigelloides NOT DETECTED NOT DETECTED Final   Yersinia enterocolitica NOT DETECTED NOT DETECTED Final   Vibrio NOT DETECTED NOT DETECTED Final   Enteropathogenic E coli DETECTED (A) NOT DETECTED Final   E coli (ETEC) LT/ST NOT DETECTED NOT DETECTED Final   E coli [id number] by PCR Not applicable NOT  DETECTED Final   Cryptosporidium by PCR NOT DETECTED NOT DETECTED Final   Entamoeba histolytica NOT DETECTED NOT DETECTED Final   Adenovirus F 40/41 NOT DETECTED NOT DETECTED Final   Norovirus GI/GII NOT DETECTED NOT DETECTED Final   Sapovirus NOT DETECTED NOT DETECTED Final    Comment: (NOTE) Performed At: [facility] [address] [place], [place] [id number] Rush Farmer MD [id number]    Vibrio cholerae NOT DETECTED NOT DETECTED Final   Campylobacter by PCR NOT DETECTED NOT DETECTED Final   Salmonella by PCR NOT DETECTED NOT DETECTED Final   E coli (STEC) NOT DETECTED NOT DETECTED Final   Enteroaggregative E coli NOT DETECTED NOT DETECTED Final   Shigella by PCR NOT DETECTED NOT DETECTED Final   Cyclospora cayetanensis NOT DETECTED NOT DETECTED Final   Astrovirus NOT DETECTED NOT DETECTED Final   G lamblia by PCR NOT DETECTED NOT DETECTED Final   Rotavirus A by PCR NOT DETECTED NOT DETECTED Final  CULTURE, BLOOD (ROUTINE X 2) w Reflex to ID Panel     Status: None   Collection Time: 02/22/19 12:59 AM   Specimen: BLOOD  Result Value Ref Range Status   Specimen Description BLOOD RIGHT ANTECUBITAL  Final   Special Requests   Final    BOTTLES DRAWN AEROBIC ONLY Blood Culture adequate volume   Culture   Final    NO GROWTH 5 DAYS Performed at [facility], [address] [place], [place] [address]    Report Status  02/27/2019 FINAL  Final  CULTURE, BLOOD (ROUTINE X 2) w Reflex to ID Panel     Status: None   Collection Time: 02/22/19 12:59 AM   Specimen: BLOOD  Result Value Ref Range Status   Specimen Description BLOOD BLOOD RIGHT HAND  Final   Special Requests   Final    BOTTLES DRAWN AEROBIC AND ANAEROBIC Blood Culture adequate volume   Culture   Final    NO GROWTH 5 DAYS Performed at [facility], [address] [place], [place] [address]    Report Status 02/27/2019 FINAL  Final  Aerobic Culture (superficial specimen)     Status: None   Collection Time: 02/24/19  2:00 PM   Specimen: Wound  Result Value Ref Range Status   Specimen Description   Final    WOUND Performed at [facility], [address], [place], [place] [address]    Special Requests   Final    NONE Performed at [facility], [address] [place], [place] [address]    Gram Stain   Final    RARE WBC PRESENT, PREDOMINANTLY PMN NO ORGANISMS SEEN    Culture   Final    NO GROWTH 3 DAYS Performed at [facility], [place] [address], [place], [place] [address]    Report Status 02/28/2019 FINAL  Final  MRSA PCR Screening     Status: None   Collection Time: 02/26/19  4:51 AM   Specimen: Nasal Mucosa; Nasopharyngeal  Result Value Ref Range Status   MRSA by PCR NEGATIVE NEGATIVE Final    Comment:        The GeneXpert MRSA Assay (FDA approved for NASAL specimens only), is one component of a comprehensive MRSA colonization surveillance program. It is not intended to diagnose MRSA infection nor to guide or monitor treatment for MRSA infections. Performed at [facility], [address], [place], [place] [address]   Respiratory Panel by RT PCR (Flu A&B, Covid) -  Nasopharyngeal Swab     Status: None   Collection Time: 03/01/19 12:59 PM   Specimen: Nasopharyngeal Swab  Result Value Ref Range Status   SARS Coronavirus 2 by RT PCR NEGATIVE NEGATIVE Final    Comment:  (NOTE) SARS-CoV-2 target nucleic acids are NOT DETECTED. The SARS-CoV-2 RNA is generally detectable in upper respiratoy specimens during the acute phase of infection. The lowest concentration of SARS-CoV-2 viral copies this assay can detect is 131 copies/mL. A negative result does not preclude SARS-Cov-2 infection and should not be used as the sole basis for treatment or other patient management decisions. A negative result may occur with  improper specimen collection/handling, submission of specimen other than nasopharyngeal swab, presence of viral mutation(s) within the areas targeted by this assay, and inadequate number of viral copies (<131 copies/mL). A negative result must be combined with clinical observations, patient history, and epidemiological information. The expected result is Negative. Fact Sheet for Patients:  PinkCheek.be Fact Sheet for Healthcare Providers:  GravelBags.it This test is not yet ap proved or cleared by the [place] FDA and  has been authorized for detection and/or diagnosis of SARS-CoV-2 by FDA under an Emergency Use Authorization (EUA). This EUA will remain  in effect (meaning this test can be used) for the duration of the COVID-19 declaration under Section 564(b)(1) of the Act, 21 U.S.C. section 360bbb-3(b)(1), unless the authorization is terminated or revoked sooner.    Influenza A by PCR NEGATIVE NEGATIVE Final   Influenza B by PCR NEGATIVE NEGATIVE Final    Comment: (NOTE) The Xpert Xpress SARS-CoV-2/FLU/RSV assay is intended as an aid in  the diagnosis of influenza from Nasopharyngeal swab specimens and  should not be used as a sole basis for treatment. Nasal washings and  aspirates are unacceptable for Xpert Xpress SARS-CoV-2/FLU/RSV  testing. Fact Sheet for Patients: PinkCheek.be Fact Sheet for Healthcare  Providers: GravelBags.it This test is not yet approved or cleared by the [place] FDA and  has been authorized for detection and/or diagnosis of SARS-CoV-2 by  FDA under an Emergency Use Authorization (EUA). This EUA will remain  in effect (meaning this test can be used) for the duration of the  Covid-19 declaration under Section 564(b)(1) of the Act, 21  U.S.C. section 360bbb-3(b)(1), unless the authorization is  terminated or revoked. Performed at [facility], [address] [place], [place] [address]    Studies/Results: No results found. Medications: I have reviewed the patient's current medications. Scheduled Meds: . calcium-vitamin D  2 tablet Oral Daily  . chlorhexidine  15 mL Mouth Rinse BID  . Chlorhexidine Gluconate Cloth  6 each Topical Daily  . loratadine  10 mg Oral Daily  . mouth rinse  15 mL Mouth Rinse q12n4p  . pantoprazole  40 mg Oral BID AC  . sodium chloride flush  10-40 mL Intracatheter Q12H  . sodium chloride flush  10-40 mL Intracatheter Q12H   Continuous Infusions: .  ceFAZolin (ANCEF) IV 2 g (03/01/19 1438)  . dextrose 50 mL/hr at 02/27/19 1600   PRN Meds:.acetaminophen, bisacodyl, hydroxypropyl methylcellulose / hypromellose, ondansetron, sodium chloride flush, sodium chloride flush   Assessment: Active Problems:   Symptomatic anemia   SOB (shortness of breath)   Gastrointestinal hemorrhage   Sepsis (HCC)   Pressure injury of skin    Plan: This patient has had no further sign of GI bleeding.  The patient hemoglobin has been stable after transfusion of 1 unit of blood yesterday.  The patient is awaiting  discharge.  Nothing further to do from a GI point of view.  I will sign off.  Please call if any further GI concerns or questions.  We would like to thank you for the opportunity to participate in the care of Jasmine Morrow.      LOS: 9 days   Jasmine Morrow 03/01/2019, 9:21 PM Pager [phone]  7am-5pm  Check AMION for 5pm -7am coverage and on weekends

## 2019-03-01 NOTE — Discharge Instructions (Signed)
Anemia  Anemia is a condition in which you do not have enough red blood cells or hemoglobin. Hemoglobin is a substance in red blood cells that carries oxygen. When you do not have enough red blood cells or hemoglobin (are anemic), your body cannot get enough oxygen and your organs may not work properly. As a result, you may feel very tired or have other problems. What are the causes? Common causes of anemia include:  Excessive bleeding. Anemia can be caused by excessive bleeding inside or outside the body, including bleeding from the intestine or from periods in women.  Poor nutrition.  Long-lasting (chronic) kidney, thyroid, and liver disease.  Bone marrow disorders.  Cancer and treatments for cancer.  HIV (human immunodeficiency virus) and AIDS (acquired immunodeficiency syndrome).  Treatments for HIV and AIDS.  Spleen problems.  Blood disorders.  Infections, medicines, and autoimmune disorders that destroy red blood cells. What are the signs or symptoms? Symptoms of this condition include:  Minor weakness.  Dizziness.  Headache.  Feeling heartbeats that are irregular or faster than normal (palpitations).  Shortness of breath, especially with exercise.  Paleness.  Cold sensitivity.  Indigestion.  Nausea.  Difficulty sleeping.  Difficulty concentrating. Symptoms may occur suddenly or develop slowly. If your anemia is mild, you may not have symptoms. How is this diagnosed? This condition is diagnosed based on:  Blood tests.  Your medical history.  A physical exam.  Bone marrow biopsy. Your health care provider may also check your stool (feces) for blood and may do additional testing to look for the cause of your bleeding. You may also have other tests, including:  Imaging tests, such as a CT scan or MRI.  Endoscopy.  Colonoscopy. How is this treated? Treatment for this condition depends on the cause. If you continue to lose a lot of blood, you may  need to be treated at a hospital. Treatment may include:  Taking supplements of iron, vitamin S31, or folic acid.  Taking a hormone medicine (erythropoietin) that can help to stimulate red blood cell growth.  Having a blood transfusion. This may be needed if you lose a lot of blood.  Making changes to your diet.  Having surgery to remove your spleen. Follow these instructions at home:  Take over-the-counter and prescription medicines only as told by your health care provider.  Take supplements only as told by your health care provider.  Follow any diet instructions that you were given.  Keep all follow-up visits as told by your health care provider. This is important. Contact a health care provider if:  You develop new bleeding anywhere in the body. Get help right away if:  You are very weak.  You are short of breath.  You have pain in your abdomen or chest.  You are dizzy or feel faint.  You have trouble concentrating.  You have bloody or black, tarry stools.  You vomit repeatedly or you vomit up blood. Summary  Anemia is a condition in which you do not have enough red blood cells or enough of a substance in your red blood cells that carries oxygen (hemoglobin).  Symptoms may occur suddenly or develop slowly.  If your anemia is mild, you may not have symptoms.  This condition is diagnosed with blood tests as well as a medical history and physical exam. Other tests may be needed.  Treatment for this condition depends on the cause of the anemia. This information is not intended to replace advice given to you by  your health care provider. Make sure you discuss any questions you have with your health care provider. Document Revised: 12/30/2016 Document Reviewed: 02/19/2016 Elsevier Patient Education  [place].   Anemia  Anemia is a condition in which you do not have enough red blood cells or hemoglobin. Hemoglobin is a substance in red blood cells that  carries oxygen. When you do not have enough red blood cells or hemoglobin (are anemic), your body cannot get enough oxygen and your organs may not work properly. As a result, you may feel very tired or have other problems. What are the causes? Common causes of anemia include:  Excessive bleeding. Anemia can be caused by excessive bleeding inside or outside the body, including bleeding from the intestine or from periods in women.  Poor nutrition.  Long-lasting (chronic) kidney, thyroid, and liver disease.  Bone marrow disorders.  Cancer and treatments for cancer.  HIV (human immunodeficiency virus) and AIDS (acquired immunodeficiency syndrome).  Treatments for HIV and AIDS.  Spleen problems.  Blood disorders.  Infections, medicines, and autoimmune disorders that destroy red blood cells. What are the signs or symptoms? Symptoms of this condition include:  Minor weakness.  Dizziness.  Headache.  Feeling heartbeats that are irregular or faster than normal (palpitations).  Shortness of breath, especially with exercise.  Paleness.  Cold sensitivity.  Indigestion.  Nausea.  Difficulty sleeping.  Difficulty concentrating. Symptoms may occur suddenly or develop slowly. If your anemia is mild, you may not have symptoms. How is this diagnosed? This condition is diagnosed based on:  Blood tests.  Your medical history.  A physical exam.  Bone marrow biopsy. Your health care provider may also check your stool (feces) for blood and may do additional testing to look for the cause of your bleeding. You may also have other tests, including:  Imaging tests, such as a CT scan or MRI.  Endoscopy.  Colonoscopy. How is this treated? Treatment for this condition depends on the cause. If you continue to lose a lot of blood, you may need to be treated at a hospital. Treatment may include:  Taking supplements of iron, vitamin M09, or folic acid.  Taking a hormone medicine  (erythropoietin) that can help to stimulate red blood cell growth.  Having a blood transfusion. This may be needed if you lose a lot of blood.  Making changes to your diet.  Having surgery to remove your spleen. Follow these instructions at home:  Take over-the-counter and prescription medicines only as told by your health care provider.  Take supplements only as told by your health care provider.  Follow any diet instructions that you were given.  Keep all follow-up visits as told by your health care provider. This is important. Contact a health care provider if:  You develop new bleeding anywhere in the body. Get help right away if:  You are very weak.  You are short of breath.  You have pain in your abdomen or chest.  You are dizzy or feel faint.  You have trouble concentrating.  You have bloody or black, tarry stools.  You vomit repeatedly or you vomit up blood. Summary  Anemia is a condition in which you do not have enough red blood cells or enough of a substance in your red blood cells that carries oxygen (hemoglobin).  Symptoms may occur suddenly or develop slowly.  If your anemia is mild, you may not have symptoms.  This condition is diagnosed with blood tests as well as a medical  history and physical exam. Other tests may be needed.  Treatment for this condition depends on the cause of the anemia. This information is not intended to replace advice given to you by your health care provider. Make sure you discuss any questions you have with your health care provider. Document Revised: 12/30/2016 Document Reviewed: 02/19/2016 Elsevier Patient Education  [place].

## 2019-03-01 NOTE — Progress Notes (Addendum)
Physical Therapy Treatment Patient Details Name: Jasmine Morrow MRN: [id number] DOB: 1945-12-11 Today's Date: 03/01/2019    History of Present Illness Pt is 74 y.o. female with a known history of recent ankle fracture and ORIF 02/08/2019. Admitted from from [facility] for chest pain. Found to have hypotension and anemia secondary to melena. Admitted for septic/hemorrhagic shock initially requiring pressor support. She was then intubated due to worsening mental status and inability to protect airway.  Extubated on 02/24/19. Now off the pressors.  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  No more melena or hematemesis. Has had total of 6 units of PRBC and 1 unit of fresh frozen plasma.    PT Comments    Pt was long sitting in bed upon arriving. She is agreeable to PT session and reports 4/10 R ankle/foot pain. Resting vitals BP 123/64, O2 99% on room air, and HR 105 bpm. She was able to recall proper wt bearing restrictions (TTWB) however was unwilling to trial EOB sitting or any OOB activity even with max encouragement. She does however agree to bed exercises to improve strength. Pt is planning to d/c to SNF this date. See exercises listed below for there ex performed. She tolerated well but continues to present with strength deficits throughout BLEs. PT will continue to encourage increased activity and progress pt as able per POC. Pt was repositioned in bed post session with call bell in reach, bed alarm set, and RLE elevated.    Follow Up Recommendations  SNF     Equipment Recommendations  None recommended by PT    Recommendations for Other Services       Precautions / Restrictions Precautions Precautions: Fall Precaution Comments: R ankle cast Required Braces or Orthoses: Splint/Cast Splint/Cast: R foot/ankle cast Restrictions Weight Bearing Restrictions: Yes RLE Weight Bearing: Touchdown weight bearing    Mobility  Bed Mobility Overal bed mobility:  Needs Assistance                Transfers                    Ambulation/Gait                 Stairs             Wheelchair Mobility    Modified Rankin (Stroke Patients Only)       Balance                                            Cognition Arousal/Alertness: Awake/alert Behavior During Therapy: WFL for tasks assessed/performed Overall Cognitive Status: Within Functional Limits for tasks assessed                                 General Comments: Pt was alert and oriented this date. pleasant and cooperative.      Exercises General Exercises - Lower Extremity Ankle Circles/Pumps: AROM;Left;20 reps;Supine(2 sets) Quad Sets: AROM;10 reps;Supine;Both(2 sets) Gluteal Sets: AROM;10 reps;Supine;Both(2 sets) Short Arc Javier [employer]clair Ship;Both;10 reps;Supine(2 sets) Heel Slides: AAROM;Both;10 reps;Supine(2 sets) Hip ABduction/ADduction: AAROM;Both;10 reps;Supine(2 sets) Straight Leg Raises: AAROM;Both;10 reps;Supine(2 sets)    General Comments        Pertinent Vitals/Pain Pain Assessment: 0-10 Pain Score: 4  Faces Pain Scale: Hurts a little bit Pain Location: RLEwith movement Pain  Descriptors / Indicators: Constant Pain Intervention(s): Limited activity within patient's tolerance;Premedicated before session    Home Living                      Prior Function            PT Goals (current goals can now be found in the care plan section) Acute Rehab PT Goals Patient Stated Goal: " I feel ok" "4/10 pain Progress towards PT goals: Progressing toward goals(slow progression with strengthening)    Frequency    7X/week      PT Plan Current plan remains appropriate    Co-evaluation     PT goals addressed during session: Strengthening/ROM        AM-PAC PT "6 Clicks" Mobility   Outcome Measure  Help needed turning from your back to your side while in a flat bed without using bedrails?:  Total Help needed moving from lying on your back to sitting on the side of a flat bed without using bedrails?: Total Help needed moving to and from a bed to a chair (including a wheelchair)?: Total Help needed standing up from a chair using your arms (e.g., wheelchair or bedside chair)?: Total Help needed to walk in hospital room?: Total Help needed climbing 3-5 steps with a railing? : Total 6 Click Score: 6    End of Session   Activity Tolerance: Patient tolerated treatment well Patient left: in bed;with call bell/phone within reach;with bed alarm set Nurse Communication: Mobility status PT Visit Diagnosis: Muscle weakness (generalized) (M62.81);History of falling (Z91.81);Difficulty in walking, not elsewhere classified (R26.2);Pain Pain - Right/Left: Right Pain - part of body: Ankle and joints of foot     Time: 1110-1140 PT Time Calculation (min) (ACUTE ONLY): 30 min  Charges:  $Therapeutic Exercise: 23-37 mins                     Julaine Fusi PTA 03/01/19, 11:53 AM

## 2019-03-01 NOTE — TOC Progression Note (Addendum)
Transition of Care [facility]) - Progression Note    Patient Details  Name: Jasmine Morrow MRN: [id number] Date of Birth: [birth year]  [facility] CM/SW Contact  Eileen Stanford, LCSW Phone Number: 03/01/2019, 10:51 AM  Clinical Narrative:   Pt is d/c. CSW spoke with pt and let her know she would be d/c. Pt was upset. CSW provided only bed offer at this time. Pt said she would call CSW back.   CSW spoke with [facility], they will not have a non covid bed until tomorrow. CSW explained this to pt. Pt is now asking CSW to send referral to [facility].    [facility] has declined pt. Pt is made aware. Pt was faxed out further. [facility] in [place] offered a bed. Pt was made aware and ask that CSW contact her son and spouse. CSW attempted to reach son however it was busy. CSW called back into the room and pt's spouse answered. He was updated and states he was going to call pt's son because he said he spoke with [facility] and states they said they had a bed for the pt.   Pt's son called CSW back and states he doesn't understand why  take pt when they said they "had a bed last week." Pt's son is going to contact [facility] and will call CSW back.  [facility] will not have a bed until Monday. [facility] has now offered a bed. Son has been notified.  [facility] wont have a bed until Sunday. [facility] in [place] has made a offer. CSW awaiting response to determine when bed would be available.    Expected Discharge Plan: [place] Barriers to Discharge: Continued Medical Work up  Expected Discharge Plan and Services Expected Discharge Plan: [place]   Discharge Planning Services: CM Consult   Living arrangements for the past 2 months: [place] Expected Discharge Date: 03/01/19                                     Social Determinants of Health (SDOH) Interventions    Readmission Risk  Interventions No flowsheet data found.

## 2019-03-01 NOTE — Discharge Summary (Addendum)
Physician Discharge Summary  Addendum: Supposed to be discharged to SNF on 03/02/19 but due disagreement between husband and accepting SNF's, Pt ended up staying. Pt is medically sable to be discharged to SNF.   Patient ID: KEDRA TAMAS MRN: [id number] DOB/AGE: [birth year] 74 y.o.  Admit date: 02/20/2019 Discharge date: 03/07/2019  Admission Diagnoses:  Discharge Diagnoses:  Active Problems:   Symptomatic anemia   SOB (shortness of breath)   Gastrointestinal hemorrhage   Sepsis (HCC)   Pressure injury of skin   Discharged Condition: fair  Hospital Course:  Brief Narrative:  LindaJohnsonis a74 y.o.femalewith a known history of recent ankle fracture on 1/7 who comes from [place] for chest pain. Found to have hypotension and anemia secondary to melena. Admitted for septic/hemorrhagic shock initially requiring pressor support. She was then intubated due to worsening mental status and inability to protect airway.  Extubated on 02/24/19. Now off the pressors.  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  No more melena or hematemesis.  See if total of 6 units of PRBC and 1 unit of fresh frozen plasma.  Septic/hemorrhagic shock.  Resolved  Most likely secondary to diverticular bleed.  Patient underwent EGD and colonoscopy along with RBC tagged red cell scan (during this hospitalization) which was negative for any active bleeding.  She received multiple blood transfusions, total of 7 PRBC and 1 unit of FFP. Hemoglobin was found to be around 7.4 without any obvious bleeding. --> Received another unit of pRBC. Repeat H/H on D/C remained above 8/26.  -Expected, hemoglobin and hematocrit should remain within the stable range.  Do not anticipate frequent blood transfusion either.  Check hemoglobin and hematocrit per facility protocol only.  - Rectal tube was pulled. No nursing concern about melena or hematochezia at this time.  Had a couple of BMs which was appeared  to be normal. - GI was reconsulted on 02/28/19 due slight drop of H/H --> No new intervention is indicated as Pt's H/H remained stable   - PT/OT rec to SNF.  - CM found a bed at the local SNF on 03/01/19. Pt is medically stable to be discharged to SNF.   MSSA bacteremia Blood cultures growing MSSA, most likely her surgical site is the source. TTE and TEE both were negative for any endocarditis  Antibiotics were deescalated to cefazolin-will need 4 weeks of cefazolin.  End date 03/29/2019. -PICC line is in place. -ID has been following; placed order for Abx OP/at the SNF and f/u labs already   AKI.  Resolved now, most likely secondary to shock.  Hypokalemia: Resolved     Right ankle fracture s/p recent ORIF - Cast changed by ortho on 03/06/2019 so she will not have to transported Friday to their office.  Skin is healing, no sign of infection. - ortho planning outpatient ORIF next Thursday. - Currently no sign of infection at the surgical site. - TTWB for PT - SNF for rehab   Consults: ID and GI and Ortho Surgery   Significant Diagnostic Studies: Radiologic images, blood work  Treatments: Per hospital course and discharge med list  Discharge Exam: Blood pressure (!) 142/64, pulse 99, temperature 98.3 F (36.8 C), temperature source Oral, resp. rate 18, height 5\' 8"  (1.727 m), weight (!) 141.2 kg, SpO2 100 %.  General exam: appears calm and comfortable  Respiratory system: Clear to auscultation. Respiratory effort normal. Cardiovascular system: S1 & S2 heard, RRR. No JVD, murmurs, rubs, gallops or clicks. Gastrointestinal system: Soft, nontender,  nondistended, bowel sounds positive. Central nervous system: Alert and oriented. No focal neurological deficits.Symmetric 5 x 5 power. Extremities: Right ankle with Ace wrap, no edema, no cyanosis, pulses intact  Skin: No rashes, lesions or ulcers Psychiatry: Judgement and insight appear normal. Mood & affect appropriate.   Disposition:  Discharge disposition: 03-Skilled Nursing Facility     Patient is stable to be discharged to the skilled nursing facility with continuation of IV antibiotic as directed by the infectious diseases specialty.  Patient is advised to follow-up with facility MD and her PCP and specialties as indicated.  Spoke to patient's husband.  Answered to all questions.  Expressed understanding.  Husband and wife has preferred nursing facility.  Notified case Freight forwarder.  Patient should be qualified to be discharged to that particular SNF.  Discharge Instructions    Call MD for:   Complete by: As directed    Call MD for any bright red blood per rectum, bright blood present with vomiting or black tarry stool or coffee-ground emesis   Call MD for:  difficulty breathing, headache or visual disturbances   Complete by: As directed    Call MD for:  temperature >100.4   Complete by: As directed    Diet - low sodium heart healthy   Complete by: As directed    Home infusion instructions   Complete by: As directed    Instructions: Flushing of vascular access device: 0.9% NaCl pre/post medication administration and prn patency; Heparin 100 u/ml, 81ml for implanted ports and Heparin 10u/ml, 37ml for all other central venous catheters.   Walk with assistance   Complete by: As directed    Per PT/OT     Allergies as of 03/07/2019      Reactions   Celecoxib Nausea Only   Hydrocodone-acetaminophen Nausea And Vomiting   Meloxicam Nausea Only   Morphine Sulfate Er Beads Itching   Naproxen Swelling   Rofecoxib Nausea Only   Sulfa Antibiotics Other (See Comments)   Reaction: unknown   Ciprofloxacin Rash   Nickel Rash   Tramadol-acetaminophen Nausea Only, Rash      Medication List    STOP taking these medications   aspirin EC 325 MG tablet   azithromycin 250 MG tablet Commonly known as: ZITHROMAX   cetirizine 5 MG tablet Commonly known as: ZYRTEC Replaced by: loratadine 10 MG tablet   omeprazole 20 MG  capsule Commonly known as: PRILOSEC   triamterene-hydrochlorothiazide 37.5-25 MG tablet Commonly known as: MAXZIDE-25     TAKE these medications   acetaminophen 500 MG tablet Commonly known as: TYLENOL Take 1,000 mg by mouth every 6 (six) hours as needed for mild pain or headache.   albuterol 108 (90 Base) MCG/ACT inhaler Commonly known as: VENTOLIN HFA Inhale 2 puffs into the lungs every 4 (four) hours as needed. What changed: reasons to take this   bisacodyl 10 MG suppository Commonly known as: DULCOLAX Place 1 suppository (10 mg total) rectally daily as needed for moderate constipation.   Calcium-Vitamin D 600-200 MG-UNIT tablet Take 2 tablets by mouth daily.   carvedilol 3.125 MG tablet Commonly known as: COREG Take 3.125 mg by mouth 2 (two) times daily.   ceFAZolin  IVPB Commonly known as: ANCEF Inject 2 g into the vein every 8 (eight) hours for 23 days. Indication:  MSSA bacteremia Last Day of Therapy:  03/29/2019 Labs - Once weekly:  CBC/D and CMP   cyclobenzaprine 10 MG tablet Commonly known as: FLEXERIL Take 10 mg by mouth daily as  needed for muscle spasms.   fluconazole 150 MG tablet Commonly known as: DIFLUCAN Take 1 tablet (150 mg total) by mouth daily as needed (PRN for genital fungus while on Abx (Has a Hx of and taking it as PRN)).   fluocinonide cream 0.05 % Commonly known as: LIDEX Apply 1 application topically 3 (three) times daily as needed (mouth sores). Apply to mouth lesions as needed   hydroxypropyl methylcellulose / hypromellose 2.5 % ophthalmic solution Commonly known as: ISOPTO TEARS / GONIOVISC Place 1 drop into both eyes every 8 (eight) hours as needed for dry eyes.   loratadine 10 MG tablet Commonly known as: CLARITIN Take 1 tablet (10 mg total) by mouth daily. Replaces: cetirizine 5 MG tablet   mouth rinse Liqd solution 15 mLs by Mouth Rinse route 2 times daily at 12 noon and 4 pm.   multivitamin with minerals Tabs tablet Take 1  tablet by mouth daily.   ondansetron 8 MG tablet Commonly known as: Zofran Take 1 tablet (8 mg total) by mouth every 8 (eight) hours as needed for nausea or vomiting.   pantoprazole 40 MG tablet Commonly known as: PROTONIX Take 1 tablet (40 mg total) by mouth 2 (two) times daily before a meal.   PRESERVISION AREDS 2 PO Take 1 tablet by mouth 2 (two) times daily.   traMADol 50 MG tablet Commonly known as: ULTRAM Take 1-2 tablets (50-100 mg total) by mouth every 6 (six) hours as needed for severe pain.            Home Infusion Instuctions  (From admission, onward)         Start     Ordered   03/01/19 0000  Home infusion instructions    Question:  Instructions  Answer:  Flushing of vascular access device: 0.9% NaCl pre/post medication administration and prn patency; Heparin 100 u/ml, 45ml for implanted ports and Heparin 10u/ml, 48ml for all other central venous catheters.   03/01/19 1044          Contact information for follow-up providers    Hessie Knows, MD. Go on 03/08/2019.   Specialty: Orthopedic Surgery Why: f/u appointment at [facility] information: [address] [place] [address] [phone]            Contact information for after-discharge care    Destination    [facility] .   Service: Skilled Nursing Contact information: [place] [phone]                  Signed: Max Sane 03/07/2019, 8:11 AM

## 2019-03-01 NOTE — Progress Notes (Signed)
ID  Feeling better Awaiting SNF  Patient Vitals for the past 24 hrs:  BP Temp Temp src Pulse Resp SpO2 Weight  03/01/19 0812 (!) 118/55 97.9 F (36.6 C) Oral 97 19 100 % --  03/01/19 0419 (!) 114/58 98.2 F (36.8 C) Oral (!) 109 20 100 % (!) 141.5 kg  02/28/19 1922 122/62 98.4 F (36.9 C) Oral (!) 102 18 100 % --   O/e awake and alert, no distress Rt leg dressing not removed abd soft   CBC Latest Ref Rng & Units 03/01/2019 02/28/2019 02/28/2019  WBC 4.0 - 10.5 K/uL 5.4 - -  Hemoglobin 12.0 - 15.0 g/dL 8.2(L) 8.4(L) 7.4(L)  Hematocrit 36.0 - 46.0 % 26.2(L) 27.6(L) 24.6(L)  Platelets 150 - 400 K/uL 229 - -    CMP Latest Ref Rng & Units 03/01/2019 02/28/2019 02/27/2019  Glucose 70 - 99 mg/dL 95 95 91  BUN 8 - 23 mg/dL 12 17 22   Creatinine 0.44 - 1.00 mg/dL 0.58 0.56 0.66  Sodium 135 - 145 mmol/L 137 139 142  Potassium 3.5 - 5.1 mmol/L 3.8 3.4(L) 3.7  Chloride 98 - 111 mmol/L 109 111 115(H)  CO2 22 - 32 mmol/L 23 23 20(L)  Calcium 8.9 - 10.3 mg/dL 8.5(L) 8.6(L) 8.7(L)  Total Protein 6.5 - 8.1 g/dL - - -  Total Bilirubin 0.3 - 1.2 mg/dL - - -  Alkaline Phos 38 - 126 U/L - - -  AST 15 - 41 U/L - - -  ALT 0 - 44 U/L - - -    Impression/recommendation  MSSA bacteremia - unclear source- had a superficial wound on the anterior aspect of the rt ankle area- culture that was done from the wound was negative b ( but done a few days on antibiotic) Surgical site of th ORIF itself was okay. CT of the foot did not show osteo or collection TEE neg CT of the lumbar spine no discitis repeat blood culture in 48 hrs promptly negative  will be on cefazolin until 03/29/19 She has ORIF rt ankle- Dr.Menz planning to do an aspiration/culture in 2 weeks before another surgery to place hardware can be done. Discussed with him  Severe GI bleed needing multiple transfuison Nuclear med scan was neg, colonoscopy revealed multiple diverticul and blood in colon. There was no blood in the diverticula but  that was questioned to be the source by GI. A repeat Ct and angio is considered if she bleeds again. She has been stable   AKi resolved  Thrombocytosis and leukemoid reaction resolved  Anemia- stable Circulatory shock - resolved  Discussed with patient. She is awaiting to go to SNF. Will follow as OP

## 2019-03-01 NOTE — Care Management Important Message (Signed)
Important Message  Patient Details  Name: Jasmine Morrow MRN: [id number] Date of Birth: 1945/10/17   Medicare Important Message Given:  Yes     Dannette Barbara 03/01/2019, 11:18 AM

## 2019-03-02 LAB — GLUCOSE, CAPILLARY: Glucose-Capillary: 106 mg/dL — ABNORMAL HIGH (ref 70–99)

## 2019-03-02 MED ORDER — FLUCONAZOLE 100 MG PO TABS
100.0000 mg | ORAL_TABLET | Freq: Every day | ORAL | Status: AC
  Administered 2019-03-02 – 2019-03-04 (×3): 100 mg via ORAL
  Filled 2019-03-02 (×3): qty 1

## 2019-03-02 NOTE — Progress Notes (Signed)
Physical Therapy Treatment Patient Details Name: Jasmine Morrow MRN: [id number] DOB: [birth year] Today's Date: 03/02/2019    History of Present Illness Pt is 74 y.o. female with a known history of recent ankle fracture and ORIF 02/08/2019. Admitted from from [facility] for chest pain. Found to have hypotension and anemia secondary to melena. Admitted for septic/hemorrhagic shock initially requiring pressor support. She was then intubated due to worsening mental status and inability to protect airway.  Extubated on 02/24/19. Now off the pressors.  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  No more melena or hematemesis. Has had total of 6 units of PRBC and 1 unit of fresh frozen plasma.    PT Comments    Pt was supine in bed upon arriving. She agrees to PT session and is cooperative throughout. Pt is slightly anxious. Resting vitals BP: 114/75 HR 102, and O2 98% on room air. Agreeable to sitting up EOB. Pt require +2 max assist to roll R and transition to sitting on EOB. Increased time to perform with vcs for technique and sequencing.  Pt sat EOB x 5 minutes performing several exercises (listed below) prior to fatigue and requesting to return to supine. Total assist progressing BLEs back into bed and repositioning pt to [facility]. Once settled supine, pt performed strengthening exercises and tolerated well. HR did elevated to 120s but quickly decreases with rest and breathing techniques. Pt was resting comfortably in bed with BP 122/64 sao2 98% and HR 108 post session. She is progressing with PT and will continue current POC until d/c. PT continues to recommend SNF at d/c.    Follow Up Recommendations  SNF     Equipment Recommendations  None recommended by PT    Recommendations for Other Services       Precautions / Restrictions Precautions Precautions: Fall Precaution Comments: R ankle cast Required Braces or Orthoses: Splint/Cast Splint/Cast: R foot/ankle  cast Restrictions Weight Bearing Restrictions: Yes RLE Weight Bearing: Touchdown weight bearing(RLE)    Mobility  Bed Mobility Overal bed mobility: Needs Assistance Bed Mobility: Supine to Sit;Sit to Supine Rolling: +2 for physical assistance;Max assist;Total assist   Supine to sit: Max assist;+2 for physical assistance Sit to supine: Total assist   General bed mobility comments: Pt required max assist + 2 to roll R to short sit with one person assiting LEs and other assisting upper body. Vcs throughout for technique and sequencing.  Transfers                    Ambulation/Gait                 Stairs             Wheelchair Mobility    Modified Rankin (Stroke Patients Only)       Balance Overall balance assessment: Needs assistance;History of Falls Sitting-balance support: Feet supported;Bilateral upper extremity supported Sitting balance-Leahy Scale: Good Sitting balance - Comments: Pt sat EOB x 10 minutes prior to returning to supine. she required BUE/BLE support to maintain with CGA for safety. Occasional posterior lean but able to correct without therapist intervention Postural control: Posterior lean                                  Cognition Arousal/Alertness: Awake/alert Behavior During Therapy: WFL for tasks assessed/performed Overall Cognitive Status: Within Functional Limits for tasks assessed Area of Impairment: Attention  Current Attention Level: Selective           General Comments: Pt was alert throughout and cooperative this date. Needs refocusing to stay on task desired      Exercises General Exercises - Lower Extremity Ankle Circles/Pumps: AROM;Left;20 reps;Supine Quad Sets: AROM;Both;10 reps;Supine Gluteal Sets: AROM;10 reps;Supine Long Arc Quad: AAROM;Both;10 reps;Seated( AAROM LAQ seated EOB 2 x10 BLEs) Heel Slides: AAROM;Both;10 reps;Supine Hip ABduction/ADduction:  AAROM;Both;10 reps;Supine Straight Leg Raises: AAROM;Both;10 reps;Supine    General Comments        Pertinent Vitals/Pain Pain Assessment: No/denies pain Pain Score: 0-No pain(until post session) Faces Pain Scale: Hurts a little bit Pain Location: RLEwith movement Pain Descriptors / Indicators: Guarding Pain Intervention(s): Other (comment)(pt c/o minimal pain after session 2/10)    Home Living                      Prior Function            PT Goals (current goals can now be found in the care plan section) Acute Rehab PT Goals Patient Stated Goal: " I'm feeling pretty good today" Progress towards PT goals: Progressing toward goals    Frequency    7X/week      PT Plan Current plan remains appropriate    Co-evaluation     PT goals addressed during session: Mobility/safety with mobility;Strengthening/ROM        AM-PAC PT "6 Clicks" Mobility   Outcome Measure  Help needed turning from your back to your side while in a flat bed without using bedrails?: A Lot Help needed moving from lying on your back to sitting on the side of a flat bed without using bedrails?: Total Help needed moving to and from a bed to a chair (including a wheelchair)?: Total Help needed standing up from a chair using your arms (e.g., wheelchair or bedside chair)?: Total Help needed to walk in hospital room?: Total Help needed climbing 3-5 steps with a railing? : Total 6 Click Score: 7    End of Session Equipment Utilized During Treatment: Gait belt Activity Tolerance: Patient limited by fatigue Patient left: in bed;with call bell/phone within reach;with bed alarm set Nurse Communication: Mobility status PT Visit Diagnosis: Muscle weakness (generalized) (M62.81);History of falling (Z91.81);Difficulty in walking, not elsewhere classified (R26.2);Pain Pain - Right/Left: Right Pain - part of body: Ankle and joints of foot     Time: [id number] PT Time Calculation (min) (ACUTE  ONLY): 17 min  Charges:  $Therapeutic Activity: 8-22 mins                     Julaine Fusi PTA 03/02/19, 1:02 PM

## 2019-03-02 NOTE — Progress Notes (Signed)
PROGRESS NOTE  PCCM transfer  Jasmine Morrow  [id number] DOB: Jan 06, 1946 DOA: 02/20/2019 PCP: Jasmine Late, MD   Brief Narrative:  Jasmine Morrow  is a 74 y.o. female with a known history of recent ankle fracture on 1/7 who comes from [place] for chest pain. Found to have hypotension and anemia secondary to melena. Admitted for septic/hemorrhagic shock initially requiring pressor support. She was then intubated due to worsening mental status and inability to protect airway.  Extubated on 02/24/19. Now off the pressors.  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  No more melena or hematemesis.  See if total of 6 units of PRBC and 1 unit of fresh frozen plasma.  Subjective: Patient was feeling little weak. Wants to work with PT/OT.  SNF offered a bed.  However patient is reluctant to go there.  Assessment & Plan:   Active Problems:   Symptomatic anemia   SOB (shortness of breath)   Gastrointestinal hemorrhage   Sepsis (HCC)   Pressure injury of skin  Septic/hemorrhagic shock.  Resolved. Most likely secondary to diverticular bleed. Patient underwent EGD and colonoscopy along with RBC tagged red cell scan (during this hospitalization) which was negative for any active bleeding. She received multiple blood transfusions, total of 7 PRBC and 1 unit of FFP. Hemoglobin was found to be around 7.4 without any obvious bleeding.--> Received another unit of pRBC. Repeat H/H on D/C remained above 8/26.  -Expected at hemoglobin and hematocrit should remain within the stable range.  Do not anticipate frequent blood transfusion either.  Check hemoglobin and hematocrit per facility protocol only. transfuse if below 7. -Rectal tube was pulled. No nursing concern about melena or hematochezia at this time. Had a couple of BMs which was appeared to be normal. - GI was reconsulted on 02/28/19 due slight drop of H/H --> No new intervention is indicated as Pt's H/H remained  stable   - Continue with PPI. - PT/OT rec to SNF.  - CM found a bed at the local SNF on 03/01/19. Pt is medically stable to be discharged to SNF.   Per patient and husband have been refusing to go to anywhere else other than the preferred SNF that has already rejected.  Case manager has been working with patient's husband and family.  MSSA bacteremia. Blood cultures growing MSSA, most likely her surgical site is the source. TTE and TEE both were negative for any endocarditis  Antibiotics were deescalated to cefazolin-will need 4 weeks of cefazolin.End date 03/29/2019. -PICC line were placed. -ID has been following; placed order for Abx OP/at the SNF and f/u labs already   AKI.  Resolved now, most likely secondary to shock. -Avoid nephrotoxic. -Continue to monitor.  Hypokalemia.  Resolved  Right ankle fracture s/p recent ORIF -Ortho has seen the patient.  Currently no sign of infection at the surgical site. -Patient may follow-up outpatient  Objective:  03/01/19 1624 03/01/19 1955  BP: (!) 128/56 (!) 129/53  Pulse: 92 (!) 101  Resp: 19 20  Temp: 97.8 F (36.6 C) 98.3 F (36.8 C)  TempSrc: Oral Oral  SpO2: 100% 100%  Weight:      02/28/19 0429 03/01/19 0419  Weight: (!) 142.4 kg (!) 141.5 kg    Examination:  General exam: Chronically ill-appearing lady, appears calm and comfortable  Respiratory system: Clear to auscultation. Respiratory effort normal. Cardiovascular system: S1 & S2 heard, RRR. No JVD, murmurs, rubs, gallops or clicks. Gastrointestinal system: Soft, nontender, nondistended, bowel  sounds positive. Central nervous system: Alert and oriented. No focal neurological deficits.Symmetric 5 x 5 power. Extremities: Right ankle with Ace wrap, no edema, no cyanosis, pulses intact  Skin: No rashes, lesions or ulcers Psychiatry: Judgement and insight appear normal. Mood & affect appropriate.    DVT prophylaxis: SCDs Code Status: Full Family Communication: No family  at bedside. Disposition Plan: Patient was accepted at any skilled nursing facility.  Patient was supposed to be discharged to the facility today on 03/01/2019.  However patient and her husband refused to go there.  They have a preferred list of skilled nursing facility.  Case manager has notified and is working.  Consultants:   PCCM  ID  Orthopedic  Procedures:  Antimicrobials:  Cefazolin  Data Reviewed: I have personally reviewed following labs and imaging studies  CBC: Recent Labs  Lab 02/24/19 0300 02/24/19 0300 02/25/19 0411 02/25/19 0411 02/26/19 0620 02/26/19 0620 02/27/19 0541 02/28/19 0703 02/28/19 1407 02/28/19 2017 03/01/19 0457  WBC 9.9   < > 8.8  --  7.9  --  8.4 5.3  --   --  5.4  NEUTROABS 8.1*  --  7.1  --  5.8  --   --   --   --   --   --   HGB 7.4*   < > 7.5*   < > 7.9*   < > 8.1* 6.9* 7.4* 8.4* 8.2*  HCT 23.7*   < > 24.8*   < > 26.6*   < > 25.5* 22.8* 24.6* 27.6* 26.2*  MCV 91.9   < > 92.5  --  93.7  --  89.2 91.9  --   --  90.0  PLT 345   < > 303  --  258  --  207 202  --   --  229   < > = values in this interval not displayed.   Basic Metabolic Panel: Recent Labs  Lab 02/24/19 0300 02/24/19 2059 02/25/19 0411 02/26/19 0620 02/27/19 0541 02/28/19 0703 03/01/19 0457  NA 146*  --  148* 143 142 139 137  K 2.7*   < > 3.6 3.3* 3.7 3.4* 3.8  CL 115*  --  120* 114* 115* 111 109  CO2 23  --  21* 23 20* 23 23  GLUCOSE 96  --  97 99 91 95 95  BUN 44*  --  35* 27* 22 17 12   CREATININE 1.01*  --  0.91 0.89 0.66 0.56 0.58  CALCIUM 8.7*  --  8.9 9.1 8.7* 8.6* 8.5*  MG 2.3  --   --  2.1  --  1.5*  --   PHOS 2.6  --   --  2.5  --   --   --    < > = values in this interval not displayed.   GFR: Estimated Creatinine Clearance: 95.1 mL/min (by C-G formula based on SCr of 0.58 mg/dL). Liver Function Tests: Recent Labs  Lab 02/25/19 0411  AST 28  ALT 6  ALKPHOS 84  BILITOT 0.8  PROT 5.1*  ALBUMIN 2.2*   No results for input(s): LIPASE, AMYLASE in  the last 168 hours. No results for input(s): AMMONIA in the last 168 hours. Coagulation Profile: No results for input(s): INR, PROTIME in the last 168 hours. Cardiac Enzymes: No results for input(s): CKTOTAL, CKMB, CKMBINDEX, TROPONINI in the last 168 hours. BNP (last 3 results) No results for input(s): PROBNP in the last 8760 hours. HbA1C: No results for input(s): HGBA1C in the  last 72 hours. CBG: Recent Labs  Lab 02/24/19 0413 02/24/19 1127 02/24/19 1625 02/25/19 1145  GLUCAP 84 96 92 90   Lipid Profile: No results for input(s): CHOL, HDL, LDLCALC, TRIG, CHOLHDL, LDLDIRECT in the last 72 hours. Thyroid Function Tests: No results for input(s): TSH, T4TOTAL, FREET4, T3FREE, THYROIDAB in the last 72 hours. Anemia Panel: No results for input(s): VITAMINB12, FOLATE, FERRITIN, TIBC, IRON, RETICCTPCT in the last 72 hours. Sepsis Labs: No results for input(s): PROCALCITON, LATICACIDVEN in the last 168 hours.  Recent Results (from the past 240 hour(s))  C difficile quick scan w PCR reflex     Status: Abnormal   Collection Time: 02/20/19  6:16 PM   Specimen: STOOL  Result Value Ref Range Status   C Diff antigen POSITIVE (A) NEGATIVE Final   C Diff toxin NEGATIVE NEGATIVE Final   C Diff interpretation Results are indeterminate. See PCR results.  Final    Comment: Performed at [facility], [address] [place], [place] [address]  C. Diff by PCR, Reflexed     Status: None   Collection Time: 02/20/19  6:16 PM  Result Value Ref Range Status   Toxigenic C. Difficile by PCR NEGATIVE NEGATIVE Final    Comment: Patient is colonized with non toxigenic C. difficile. May not need treatment unless significant symptoms are present. Performed at [facility], [address] [place], [place] [address]   GI pathogen panel by PCR, stool     Status: Abnormal   Collection Time: 02/21/19  4:04 PM   Specimen: Stool  Result Value Ref Range Status   Plesiomonas shigelloides  NOT DETECTED NOT DETECTED Final   Yersinia enterocolitica NOT DETECTED NOT DETECTED Final   Vibrio NOT DETECTED NOT DETECTED Final   Enteropathogenic E coli DETECTED (A) NOT DETECTED Final   E coli (ETEC) LT/ST NOT DETECTED NOT DETECTED Final   E coli [id number] by PCR Not applicable NOT DETECTED Final   Cryptosporidium by PCR NOT DETECTED NOT DETECTED Final   Entamoeba histolytica NOT DETECTED NOT DETECTED Final   Adenovirus F 40/41 NOT DETECTED NOT DETECTED Final   Norovirus GI/GII NOT DETECTED NOT DETECTED Final   Sapovirus NOT DETECTED NOT DETECTED Final    Comment: (NOTE) Performed At: [facility] [address] [place], [place] [id number] Rush Farmer MD [id number]    Vibrio cholerae NOT DETECTED NOT DETECTED Final   Campylobacter by PCR NOT DETECTED NOT DETECTED Final   Salmonella by PCR NOT DETECTED NOT DETECTED Final   E coli (STEC) NOT DETECTED NOT DETECTED Final   Enteroaggregative E coli NOT DETECTED NOT DETECTED Final   Shigella by PCR NOT DETECTED NOT DETECTED Final   Cyclospora cayetanensis NOT DETECTED NOT DETECTED Final   Astrovirus NOT DETECTED NOT DETECTED Final   G lamblia by PCR NOT DETECTED NOT DETECTED Final   Rotavirus A by PCR NOT DETECTED NOT DETECTED Final  CULTURE, BLOOD (ROUTINE X 2) w Reflex to ID Panel     Status: None   Collection Time: 02/22/19 12:59 AM   Specimen: BLOOD  Result Value Ref Range Status   Specimen Description BLOOD RIGHT ANTECUBITAL  Final   Special Requests   Final    BOTTLES DRAWN AEROBIC ONLY Blood Culture adequate volume   Culture   Final    NO GROWTH 5 DAYS Performed at [facility], [address] [place], [place] [address]    Report Status 02/27/2019 FINAL  Final  CULTURE, BLOOD (ROUTINE X  2) w Reflex to ID Panel     Status: None   Collection Time: 02/22/19 12:59 AM   Specimen: BLOOD  Result Value Ref Range Status   Specimen Description BLOOD BLOOD RIGHT HAND  Final   Special Requests   Final     BOTTLES DRAWN AEROBIC AND ANAEROBIC Blood Culture adequate volume   Culture   Final    NO GROWTH 5 DAYS Performed at [facility], [address] [place], [place] [address]    Report Status 02/27/2019 FINAL  Final  Aerobic Culture (superficial specimen)     Status: None   Collection Time: 02/24/19  2:00 PM   Specimen: Wound  Result Value Ref Range Status   Specimen Description   Final    WOUND Performed at [facility], [address], [place], [place] [address]    Special Requests   Final    NONE Performed at [facility], [address] [place], [place] [address]    Gram Stain   Final    RARE WBC PRESENT, PREDOMINANTLY PMN NO ORGANISMS SEEN    Culture   Final    NO GROWTH 3 DAYS Performed at [facility], [place] [address], [place], [place] [address]    Report Status 02/28/2019 FINAL  Final  MRSA PCR Screening     Status: None   Collection Time: 02/26/19  4:51 AM   Specimen: Nasal Mucosa; Nasopharyngeal  Result Value Ref Range Status   MRSA by PCR NEGATIVE NEGATIVE Final    Comment:        The GeneXpert MRSA Assay (FDA approved for NASAL specimens only), is one component of a comprehensive MRSA colonization surveillance program. It is not intended to diagnose MRSA infection nor to guide or monitor treatment for MRSA infections. Performed at [facility], [address] [place], [place] [address]   Respiratory Panel by RT PCR (Flu A&B, Covid) - Nasopharyngeal Swab     Status: None   Collection Time: 03/01/19 12:59 PM   Specimen: Nasopharyngeal Swab  Result Value Ref Range Status   SARS Coronavirus 2 by RT PCR NEGATIVE NEGATIVE Final    Comment: (NOTE) SARS-CoV-2 target nucleic acids are NOT DETECTED. The SARS-CoV-2 RNA is generally detectable in upper respiratoy specimens during the acute phase of infection. The lowest concentration of SARS-CoV-2 viral copies this assay can detect is 131 copies/mL. A negative result  does not preclude SARS-Cov-2 infection and should not be used as the sole basis for treatment or other patient management decisions. A negative result may occur with  improper specimen collection/handling, submission of specimen other than nasopharyngeal swab, presence of viral mutation(s) within the areas targeted by this assay, and inadequate number of viral copies (<131 copies/mL). A negative result must be combined with clinical observations, patient history, and epidemiological information. The expected result is Negative. Fact Sheet for Patients:  PinkCheek.be Fact Sheet for Healthcare Providers:  GravelBags.it This test is not yet ap proved or cleared by the [place] FDA and  has been authorized for detection and/or diagnosis of SARS-CoV-2 by FDA under an Emergency Use Authorization (EUA). This EUA will remain  in effect (meaning this test can be used) for the duration of the COVID-19 declaration under Section 564(b)(1) of the Act, 21 U.S.C. section 360bbb-3(b)(1), unless the authorization is terminated or revoked sooner.    Influenza A by PCR NEGATIVE NEGATIVE Final   Influenza B by PCR NEGATIVE NEGATIVE Final    Comment: (NOTE)  The Xpert Xpress SARS-CoV-2/FLU/RSV assay is intended as an aid in  the diagnosis of influenza from Nasopharyngeal swab specimens and  should not be used as a sole basis for treatment. Nasal washings and  aspirates are unacceptable for Xpert Xpress SARS-CoV-2/FLU/RSV  testing. Fact Sheet for Patients: PinkCheek.be Fact Sheet for Healthcare Providers: GravelBags.it This test is not yet approved or cleared by the [place] FDA and  has been authorized for detection and/or diagnosis of SARS-CoV-2 by  FDA under an Emergency Use Authorization (EUA). This EUA will remain  in effect (meaning this test can be used) for the duration of  the  Covid-19 declaration under Section 564(b)(1) of the Act, 21  U.S.C. section 360bbb-3(b)(1), unless the authorization is  terminated or revoked. Performed at [facility], [address], [place],  [address]      Radiology Studies: No results found.  Scheduled Meds: . calcium-vitamin D  2 tablet Oral Daily  . chlorhexidine  15 mL Mouth Rinse BID  . Chlorhexidine Gluconate Cloth  6 each Topical Daily  . loratadine  10 mg Oral Daily  . mouth rinse  15 mL Mouth Rinse q12n4p  . pantoprazole  40 mg Oral BID AC  . sodium chloride flush  10-40 mL Intracatheter Q12H  . sodium chloride flush  10-40 mL Intracatheter Q12H   Continuous Infusions: . sodium chloride 250 mL (03/02/19 0538)  .  ceFAZolin (ANCEF) IV 2 g (03/02/19 0539)  . dextrose 50 mL/hr at 02/27/19 1600     LOS: 10 days   Time spent: 40 minutes.   Thornell Mule, MD [facility] Pager [phone]  If 7PM-7AM, please contact night-coverage [email or web address]  This record has been created using Systems analyst. Errors have been sought and corrected,but may not always be located. Such creation errors do not reflect on the standard of care.

## 2019-03-02 NOTE — Progress Notes (Signed)
PROGRESS NOTE  PCCM transfer  [name]  [id number] DOB: 20-Jan-1946 DOA: 02/20/2019 PCP: Derinda Late, MD   Brief Narrative:  Jasmine Morrow  is a 74 y.o. female with a known history of recent ankle fracture on 1/7 who comes from [place] for chest pain. Found to have hypotension and anemia secondary to melena. Admitted for septic/hemorrhagic shock initially requiring pressor support. She was then intubated due to worsening mental status and inability to protect airway.  Extubated on 02/24/19. Now off the pressors.  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  No more melena or hematemesis.  See if total of 6 units of PRBC and 1 unit of fresh frozen plasma.  Subjective: Patient is doing much better.  Awaiting for SNF placement.  Assessment & Plan:   Active Problems:   Symptomatic anemia   SOB (shortness of breath)   Gastrointestinal hemorrhage   Sepsis (HCC)   Pressure injury of skin  Septic/hemorrhagic shock.  Resolved. Most likely secondary to diverticular bleed. Patient underwent EGD and colonoscopy along with RBC tagged red cell scan (during this hospitalization) which was negative for any active bleeding. She received multiple blood transfusions, total of 7 PRBC and 1 unit of FFP. Hemoglobin was found to be around 7.4 without any obvious bleeding.--> Received another unit of pRBC. Repeat H/H on D/C remained above 8/26.  -Expected at hemoglobin and hematocrit should remain within the stable range.  Do not anticipate frequent blood transfusion either.  Check hemoglobin and hematocrit per facility protocol only. transfuse if below 7. -Rectal tube was pulled. No nursing concern about melena or hematochezia at this time. Had a couple of BMs which was appeared to be normal. - GI was reconsulted on 02/28/19 due slight drop of H/H --> No new intervention is indicated as Pt's H/H remained stable   - Continue with PPI. - PT/OT rec to SNF.  - CM  found a bed at the local SNF on 03/01/19. Pt is medically stable to be discharged to SNF.   Per patient and husband have been refusing to go to anywhere else other than the preferred SNF that has already rejected.  Case manager has been working with patient's husband and family.  MSSA bacteremia. Blood cultures growing MSSA, most likely her surgical site is the source. TTE and TEE both were negative for any endocarditis  Antibiotics were deescalated to cefazolin-will need 4 weeks of cefazolin.End date 03/29/2019. -PICC line were placed. -ID has been following; placed order for Abx OP/at the SNF and f/u labs already   AKI.  Resolved now, most likely secondary to shock. -Avoid nephrotoxic. -Continue to monitor.  Hypokalemia.  Resolved  Right ankle fracture s/p recent ORIF -Ortho has seen the patient.  Currently no sign of infection at the surgical site. -Patient may follow-up outpatient  Objective: Vitals with BMI 03/02/2019 03/02/2019 03/02/2019  Height - - -  Weight - - 311 lbs 3 oz  BMI - - [id number]  Systolic [id number]  Diastolic 63 58 60  Pulse [id number] 81 105    Last Weight  Most recent update: 03/02/2019  4:51 AM   Weight  141.2 kg (311 lb 3.2 oz)             Examination:  General exam: Appears very comfortable and says she is looking forward to be discharged Respiratory system: Clear to auscultation. Respiratory effort normal. Cardiovascular system: S1 & S2 heard, RRR. No JVD, murmurs, rubs, gallops or clicks. Gastrointestinal  system: Soft, nontender, nondistended, bowel sounds positive. Central nervous system: Alert and oriented. No focal neurological deficits.Symmetric 5 x 5 power. Extremities: Right ankle with Ace wrap, no edema, no cyanosis, pulses intact  Skin: No rashes, lesions or ulcers Psychiatry: Judgement and insight appear normal. Mood & affect appropriate.    DVT prophylaxis: SCDs Code Status: Full Family Communication: Spoke with husband can on 03/01/2019.   Answered all questions. Disposition Plan: CSW contacted pt's husband, Chrissie Noa, who stated that he does not want his wife to go to [facility]. He only wants her to go to [facility]. Per other CSW notes and doctor notes, [name] denied the patient even after additional paperwork. Patient's husband stated that he would talked with someone over at [facility] and get back with CSW. -Case management is working on it.  Awaiting discharge  Consultants:   PCCM  ID  Orthopedic  Procedures:  Antimicrobials:  Cefazolin  Data Reviewed: I have personally reviewed following labs and imaging studies  CBC: Recent Labs  Lab 02/24/19 0300 02/24/19 0300 02/25/19 0411 02/25/19 0411 02/26/19 0620 02/26/19 0620 02/27/19 0541 02/28/19 0703 02/28/19 1407 02/28/19 2017 03/01/19 0457  WBC 9.9   < > 8.8  --  7.9  --  8.4 5.3  --   --  5.4  NEUTROABS 8.1*  --  7.1  --  5.8  --   --   --   --   --   --   HGB 7.4*   < > 7.5*   < > 7.9*   < > 8.1* 6.9* 7.4* 8.4* 8.2*  HCT 23.7*   < > 24.8*   < > 26.6*   < > 25.5* 22.8* 24.6* 27.6* 26.2*  MCV 91.9   < > 92.5  --  93.7  --  89.2 91.9  --   --  90.0  PLT 345   < > 303  --  258  --  207 202  --   --  229   < > = values in this interval not displayed.   Basic Metabolic Panel: Recent Labs  Lab 02/24/19 0300 02/24/19 2059 02/25/19 0411 02/26/19 0620 02/27/19 0541 02/28/19 0703 03/01/19 0457  NA 146*  --  148* 143 142 139 137  K 2.7*   < > 3.6 3.3* 3.7 3.4* 3.8  CL 115*  --  120* 114* 115* 111 109  CO2 23  --  21* 23 20* 23 23  GLUCOSE 96  --  97 99 91 95 95  BUN 44*  --  35* 27* 22 17 12   CREATININE 1.01*  --  0.91 0.89 0.66 0.56 0.58  CALCIUM 8.7*  --  8.9 9.1 8.7* 8.6* 8.5*  MG 2.3  --   --  2.1  --  1.5*  --   PHOS 2.6  --   --  2.5  --   --   --    < > = values in this interval not displayed.   GFR: Estimated Creatinine Clearance: 95.1 mL/min (by C-G formula based on SCr of 0.58 mg/dL). Liver Function  Tests: Recent Labs  Lab 02/25/19 0411  AST 28  ALT 6  ALKPHOS 84  BILITOT 0.8  PROT 5.1*  ALBUMIN 2.2*   No results for input(s): LIPASE, AMYLASE in the last 168 hours. No results for input(s): AMMONIA in the last 168 hours. Coagulation Profile: No results for input(s): INR, PROTIME in the last 168 hours. Cardiac Enzymes: No  results for input(s): CKTOTAL, CKMB, CKMBINDEX, TROPONINI in the last 168 hours. BNP (last 3 results) No results for input(s): PROBNP in the last 8760 hours. HbA1C: No results for input(s): HGBA1C in the last 72 hours. CBG: Recent Labs  Lab 02/24/19 0413 02/24/19 1127 02/24/19 1625 02/25/19 1145  GLUCAP 84 96 92 90   Lipid Profile: No results for input(s): CHOL, HDL, LDLCALC, TRIG, CHOLHDL, LDLDIRECT in the last 72 hours. Thyroid Function Tests: No results for input(s): TSH, T4TOTAL, FREET4, T3FREE, THYROIDAB in the last 72 hours. Anemia Panel: No results for input(s): VITAMINB12, FOLATE, FERRITIN, TIBC, IRON, RETICCTPCT in the last 72 hours. Sepsis Labs: No results for input(s): PROCALCITON, LATICACIDVEN in the last 168 hours.  Recent Results (from the past 240 hour(s))  C difficile quick scan w PCR reflex     Status: Abnormal   Collection Time: 02/20/19  6:16 PM   Specimen: STOOL  Result Value Ref Range Status   C Diff antigen POSITIVE (A) NEGATIVE Final   C Diff toxin NEGATIVE NEGATIVE Final   C Diff interpretation Results are indeterminate. See PCR results.  Final    Comment: Performed at [facility], [address] [place], [place] [address]  C. Diff by PCR, Reflexed     Status: None   Collection Time: 02/20/19  6:16 PM  Result Value Ref Range Status   Toxigenic C. Difficile by PCR NEGATIVE NEGATIVE Final    Comment: Patient is colonized with non toxigenic C. difficile. May not need treatment unless significant symptoms are present. Performed at [facility], [address] [place], [place] [address]   GI pathogen  panel by PCR, stool     Status: Abnormal   Collection Time: 02/21/19  4:04 PM   Specimen: Stool  Result Value Ref Range Status   Plesiomonas shigelloides NOT DETECTED NOT DETECTED Final   Yersinia enterocolitica NOT DETECTED NOT DETECTED Final   Vibrio NOT DETECTED NOT DETECTED Final   Enteropathogenic E coli DETECTED (A) NOT DETECTED Final   E coli (ETEC) LT/ST NOT DETECTED NOT DETECTED Final   E coli [id number] by PCR Not applicable NOT DETECTED Final   Cryptosporidium by PCR NOT DETECTED NOT DETECTED Final   Entamoeba histolytica NOT DETECTED NOT DETECTED Final   Adenovirus F 40/41 NOT DETECTED NOT DETECTED Final   Norovirus GI/GII NOT DETECTED NOT DETECTED Final   Sapovirus NOT DETECTED NOT DETECTED Final    Comment: (NOTE) Performed At: [facility] [address] [place], [place] [id number] Rush Farmer MD [id number]    Vibrio cholerae NOT DETECTED NOT DETECTED Final   Campylobacter by PCR NOT DETECTED NOT DETECTED Final   Salmonella by PCR NOT DETECTED NOT DETECTED Final   E coli (STEC) NOT DETECTED NOT DETECTED Final   Enteroaggregative E coli NOT DETECTED NOT DETECTED Final   Shigella by PCR NOT DETECTED NOT DETECTED Final   Cyclospora cayetanensis NOT DETECTED NOT DETECTED Final   Astrovirus NOT DETECTED NOT DETECTED Final   G lamblia by PCR NOT DETECTED NOT DETECTED Final   Rotavirus A by PCR NOT DETECTED NOT DETECTED Final  CULTURE, BLOOD (ROUTINE X 2) w Reflex to ID Panel     Status: None   Collection Time: 02/22/19 12:59 AM   Specimen: BLOOD  Result Value Ref Range Status   Specimen Description BLOOD RIGHT ANTECUBITAL  Final   Special Requests   Final    BOTTLES DRAWN AEROBIC ONLY Blood Culture adequate volume   Culture  Final    NO GROWTH 5 DAYS Performed at [facility], [address] [place], [place] [address]    Report Status 02/27/2019 FINAL  Final  CULTURE, BLOOD (ROUTINE X 2) w Reflex to ID Panel     Status: None   Collection Time:  02/22/19 12:59 AM   Specimen: BLOOD  Result Value Ref Range Status   Specimen Description BLOOD BLOOD RIGHT HAND  Final   Special Requests   Final    BOTTLES DRAWN AEROBIC AND ANAEROBIC Blood Culture adequate volume   Culture   Final    NO GROWTH 5 DAYS Performed at [facility], [address] [place], [place] [address]    Report Status 02/27/2019 FINAL  Final  Aerobic Culture (superficial specimen)     Status: None   Collection Time: 02/24/19  2:00 PM   Specimen: Wound  Result Value Ref Range Status   Specimen Description   Final    WOUND Performed at [facility], [address], [place], [place] [address]    Special Requests   Final    NONE Performed at [facility], [address] [place], [place] [address]    Gram Stain   Final    RARE WBC PRESENT, PREDOMINANTLY PMN NO ORGANISMS SEEN    Culture   Final    NO GROWTH 3 DAYS Performed at [facility], [address] [place], [place] [address]    Report Status 02/28/2019 FINAL  Final  MRSA PCR Screening     Status: None   Collection Time: 02/26/19  4:51 AM   Specimen: Nasal Mucosa; Nasopharyngeal  Result Value Ref Range Status   MRSA by PCR NEGATIVE NEGATIVE Final    Comment:        The GeneXpert MRSA Assay (FDA approved for NASAL specimens only), is one component of a comprehensive MRSA colonization surveillance program. It is not intended to diagnose MRSA infection nor to guide or monitor treatment for MRSA infections. Performed at [facility], [address] [place], [place] [address]   Respiratory Panel by RT PCR (Flu A&B, Covid) - Nasopharyngeal Swab     Status: None   Collection Time: 03/01/19 12:59 PM   Specimen: Nasopharyngeal Swab  Result Value Ref Range Status   SARS Coronavirus 2 by RT PCR NEGATIVE NEGATIVE Final    Comment: (NOTE) SARS-CoV-2 target nucleic acids are NOT DETECTED. The SARS-CoV-2 RNA is generally detectable in upper  respiratoy specimens during the acute phase of infection. The lowest concentration of SARS-CoV-2 viral copies this assay can detect is 131 copies/mL. A negative result does not preclude SARS-Cov-2 infection and should not be used as the sole basis for treatment or other patient management decisions. A negative result may occur with  improper specimen collection/handling, submission of specimen other than nasopharyngeal swab, presence of viral mutation(s) within the areas targeted by this assay, and inadequate number of viral copies (<131 copies/mL). A negative result must be combined with clinical observations, patient history, and epidemiological information. The expected result is Negative. Fact Sheet for Patients:  PinkCheek.be Fact Sheet for Healthcare Providers:  GravelBags.it This test is not yet ap proved or cleared by the [place] FDA and  has been authorized for detection and/or diagnosis of SARS-CoV-2 by FDA under an Emergency Use Authorization (EUA). This EUA will remain  in effect (meaning this test can be used) for the duration of the COVID-19 declaration under Section 564(b)(1) of the Act, 21 U.S.C.  section 360bbb-3(b)(1), unless the authorization is terminated or revoked sooner.    Influenza A by PCR NEGATIVE NEGATIVE Final   Influenza B by PCR NEGATIVE NEGATIVE Final    Comment: (NOTE) The Xpert Xpress SARS-CoV-2/FLU/RSV assay is intended as an aid in  the diagnosis of influenza from Nasopharyngeal swab specimens and  should not be used as a sole basis for treatment. Nasal washings and  aspirates are unacceptable for Xpert Xpress SARS-CoV-2/FLU/RSV  testing. Fact Sheet for Patients: PinkCheek.be Fact Sheet for Healthcare Providers: GravelBags.it This test is not yet approved or cleared by the [place] FDA and  has been authorized for  detection and/or diagnosis of SARS-CoV-2 by  FDA under an Emergency Use Authorization (EUA). This EUA will remain  in effect (meaning this test can be used) for the duration of the  Covid-19 declaration under Section 564(b)(1) of the Act, 21  U.S.C. section 360bbb-3(b)(1), unless the authorization is  terminated or revoked. Performed at [facility], [address], [place], [place] [address]      Radiology Studies: No results found.  Scheduled Meds: . calcium-vitamin D  2 tablet Oral Daily  . chlorhexidine  15 mL Mouth Rinse BID  . Chlorhexidine Gluconate Cloth  6 each Topical Daily  . loratadine  10 mg Oral Daily  . mouth rinse  15 mL Mouth Rinse q12n4p  . pantoprazole  40 mg Oral BID AC  . sodium chloride flush  10-40 mL Intracatheter Q12H  . sodium chloride flush  10-40 mL Intracatheter Q12H   Continuous Infusions: . sodium chloride 500 mL (03/02/19 1436)  .  ceFAZolin (ANCEF) IV 2 g (03/02/19 1437)  . dextrose 50 mL/hr at 02/27/19 1600     LOS: 10 days   Time spent: 40 minutes.   Thornell Mule, MD [facility] Pager [phone]  If 7PM-7AM, please contact night-coverage [email or web address]  This record has been created using Systems analyst. Errors have been sought and corrected,but may not always be located. Such creation errors do not reflect on the standard of care.

## 2019-03-02 NOTE — Progress Notes (Signed)
   Patient HR in low 120s with movement, denies SOB/CP. This is not an acute change.

## 2019-03-02 NOTE — TOC Progression Note (Signed)
Transition of Care [facility]) - Progression Note    Patient Details  Name: Jasmine Morrow MRN: [id number] Date of Birth: 1945/08/23  Transition of Care [facility]) CM/SW [place], [place] Phone Number: 03/02/2019, 1:54 PM  Clinical Narrative:     CSW received a call from [facility] who stated that they can take the patient today. CSW faxed over information (discharge summary) and information about the patient's PICC line.   CSW contacted pt's husband, Jasmine Morrow, who stated that he does not want his wife to go to [facility]. He only wants her to go to [facility]. Per other CSW notes and doctor notes, [name] denied the patient even after additional paperwork. Patient's husband stated that he would talked with someone over at [facility] and get back with CSW.  CSW received a phone call from pt's husband that stated that [facility] still will not take the patient due to her having to have transfusions. Pt's husband stated that she does not need to have transfusion that she just needs PT and then she can go home after two weeks. Pt's husband stated that he only wants her to go to [facility], but since she cannot. He wants her to go to [facility] in [place] on Monday. Per husband, she was accepted and can go on Monday. Pt's husband denied wanting her to go to [facility] even though she has a bed placement for today.  CSW contacted Lanelle Bal with [employer] [facility] to inform her that the patient's husband does not want the patient to go there.  CSW contacted Marden Noble at [facility] in [place] to confirm that the patient can come on Monday. Marden Noble stated that he has not talked to anyone but he will look into it and let the CSW know.   Expected Discharge Plan: [place] Barriers to Discharge: Continued Medical Work up  Expected Discharge Plan and Services Expected Discharge Plan: [place]   Discharge Planning  Services: CM Consult   Living arrangements for the past 2 months: [place] Expected Discharge Date: 03/02/19                                     Social Determinants of Health (SDOH) Interventions    Readmission Risk Interventions No flowsheet data found.

## 2019-03-02 NOTE — Progress Notes (Addendum)
Patient Heart jumps up into the 120s with movement.   This is not an acute change.  Patient was being moved in bed.  Asymptomatic

## 2019-03-03 LAB — BPAM RBC
Blood Product Expiration Date: 202102162359
Blood Product Expiration Date: 202103012359
Blood Product Expiration Date: 202103012359
Blood Product Expiration Date: 202103082359
ISSUE DATE / TIME: 202101281547
Unit Type and Rh: 5100
Unit Type and Rh: 5100
Unit Type and Rh: 5100
Unit Type and Rh: 5100

## 2019-03-03 LAB — TYPE AND SCREEN
ABO/RH(D): O POS
Antibody Screen: POSITIVE
Donor AG Type: NEGATIVE
Unit division: 0
Unit division: 0
Unit division: 0
Unit division: 0

## 2019-03-03 LAB — PREPARE RBC (CROSSMATCH)

## 2019-03-03 MED ORDER — PSYLLIUM 95 % PO PACK
1.0000 | PACK | Freq: Every day | ORAL | Status: DC
  Administered 2019-03-03: 1 via ORAL
  Filled 2019-03-03 (×5): qty 1

## 2019-03-03 MED ORDER — MAGNESIUM HYDROXIDE 400 MG/5ML PO SUSP
30.0000 mL | Freq: Every day | ORAL | Status: DC
  Administered 2019-03-03: 14:00:00 30 mL via ORAL
  Filled 2019-03-03 (×5): qty 30

## 2019-03-03 MED ORDER — MAGNESIUM HYDROXIDE 400 MG/5ML PO SUSP: 30.0000 mL | Freq: Every day | ORAL | Status: DC | PRN

## 2019-03-03 MED ORDER — METOPROLOL TARTRATE 25 MG PO TABS: 12.5000 mg | ORAL_TABLET | Freq: Two times a day (BID) | ORAL | Status: DC

## 2019-03-03 MED ORDER — CARVEDILOL 3.125 MG PO TABS
3.1250 mg | ORAL_TABLET | Freq: Two times a day (BID) | ORAL | Status: DC
  Administered 2019-03-03 – 2019-03-07 (×8): 3.125 mg via ORAL
  Filled 2019-03-03 (×8): qty 1

## 2019-03-03 NOTE — Progress Notes (Signed)
Patient awake. cpap on standby. Patient writing and listening to music. Self administers cpap.

## 2019-03-03 NOTE — Progress Notes (Addendum)
Patient HR has been in 110s before, but more with activity.  Paged MD just to let him know.   Patient is asymptomatic.  Resting in bed   MD ordered Coreg for patient

## 2019-03-03 NOTE — Progress Notes (Addendum)
Physical Therapy Treatment Patient Details Name: Jasmine Morrow MRN: [id number] DOB: [birth year] Today's Date: 03/03/2019    History of Present Illness Pt is 74 y.o. female with a known history of recent ankle fracture and ORIF 02/08/2019. Admitted from from [facility] for chest pain. Found to have hypotension and anemia secondary to melena. Admitted for septic/hemorrhagic shock initially requiring pressor support. She was then intubated due to worsening mental status and inability to protect airway.  Extubated on 02/24/19. Now off the pressors.  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  No more melena or hematemesis. Has had total of 6 units of PRBC and 1 unit of fresh frozen plasma.    PT Comments    Pt in bed, reports general nausea and awaiting a laxative.  Prefers to stay still in bed but agrees to supine ex. Participated in exercises as described below.  Discussed progression of exercises/mobility and answered questions regarding rehab process.  Pt voiced concerns over her weight and mobility limitations.  Encouragement given.  Encouraged independent HEP as able several times per day.  SNF remains appropriate for discharge.   Follow Up Recommendations  SNF     Equipment Recommendations  None recommended by PT    Recommendations for Other Services       Precautions / Restrictions Precautions Precautions: Fall Precaution Comments: R ankle cast Required Braces or Orthoses: Splint/Cast Splint/Cast: R foot/ankle cast Restrictions Weight Bearing Restrictions: Yes RLE Weight Bearing: Touchdown weight bearing(RLE)    Mobility  Bed Mobility                  Transfers                    Ambulation/Gait                 Stairs             Wheelchair Mobility    Modified Rankin (Stroke Patients Only)       Balance                                            Cognition Arousal/Alertness:  Awake/alert Behavior During Therapy: WFL for tasks assessed/performed Overall Cognitive Status: Within Functional Limits for tasks assessed                                 General Comments: Pt was alert and oriented this date. pleasant and cooperative.      Exercises Other Exercises Other Exercises: BLE A/AAROM as able for toe wiggles, ankle pump L, heel slides, ab/add, SLR, quad sets and int/external rotation of hips to encourage neutral position all 2 x 10    General Comments        Pertinent Vitals/Pain Pain Assessment: Faces Faces Pain Scale: Hurts little more Pain Location: RLE with movement and general abdominal discomfort Pain Descriptors / Indicators: Guarding;Sore Pain Intervention(s): Limited activity within patient's tolerance;Monitored during session    Home Living                      [place] Function            PT Goals (current goals can now be found in the care plan section) Progress towards PT goals: Progressing toward goals  Frequency    7X/week      PT Plan Current plan remains appropriate    Co-evaluation              AM-PAC PT "6 Clicks" Mobility   Outcome Measure  Help needed turning from your back to your side while in a flat bed without using bedrails?: A Lot Help needed moving from lying on your back to sitting on the side of a flat bed without using bedrails?: Total Help needed moving to and from a bed to a chair (including a wheelchair)?: Total Help needed standing up from a chair using your arms (e.g., wheelchair or bedside chair)?: Total Help needed to walk in hospital room?: Total Help needed climbing 3-5 steps with a railing? : Total 6 Click Score: 7    End of Session Equipment Utilized During Treatment: Gait belt Activity Tolerance: Patient tolerated treatment well Patient left: in bed;with call bell/phone within reach;with bed alarm set Nurse Communication: Mobility status Pain - Right/Left:  Right Pain - part of body: Ankle and joints of foot     Time: [id number] PT Time Calculation (min) (ACUTE ONLY): 14 min  Charges:  $Therapeutic Exercise: 8-22 mins                    .Chesley Noon, PTA 03/03/19, 11:05 AM

## 2019-03-03 NOTE — Care Management (Addendum)
TOC RN CM: Call to [facility] @ [facility] advised that [employer] has suspended admissions to the SNF and he is not able to accept patient as previously discussed.   RN CM will outreach to husband to discuss other options. Explained inability to transfer to [facility]. Husband understands and advised he was open to other placement but would not consider [facility] or [facility]. Husband states he has a contact at [facility] and thinks he may be able to get her admitted and he also has an option of getting her into "[facility]". RN CM advised him to outreach to his contact and let them know he is trying to gain admission.   RN CM: Husband requested call to [facility] @ [facility] [phone]. Open to the option of [place].   RN CM: LVMM @ [facility] department, option 8, 1, 3 requested callback for discussion of possible rehab placement.

## 2019-03-03 NOTE — Progress Notes (Signed)
PROGRESS NOTE  PCCM transfer  Jasmine Morrow  [id number] DOB: [birth year] DOA: 02/20/2019 PCP: Derinda Late, MD   Brief Narrative:  Jasmine Morrow  is a 74 y.o. female with a known history of recent ankle fracture on 1/7 who comes from [place] for chest pain. Found to have hypotension and anemia secondary to melena. Admitted for septic/hemorrhagic shock initially requiring pressor support. She was then intubated due to worsening mental status and inability to protect airway.  Extubated on 02/24/19. Now off the pressors.  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  No more melena or hematemesis.  See if total of 6 units of PRBC and 1 unit of fresh frozen plasma.  Subjective: Patient continues to do well.  Awaiting for SNF placement.  Assessment & Plan:   Active Problems:   Symptomatic anemia   SOB (shortness of breath)   Gastrointestinal hemorrhage   Sepsis (HCC)   Pressure injury of skin  Septic/hemorrhagic shock.  Resolved. Most likely secondary to diverticular bleed. Patient underwent EGD and colonoscopy along with RBC tagged red cell scan (during this hospitalization) which was negative for any active bleeding. She received multiple blood transfusions, total of 7 PRBC and 1 unit of FFP. Hemoglobin was found to be around 7.4 without any obvious bleeding.--> Received another unit of pRBC. Repeat H/H on D/C remained above 8/26.  -Expected at hemoglobin and hematocrit should remain within the stable range.  Do not anticipate frequent blood transfusion either.  Check hemoglobin and hematocrit per facility protocol only. transfuse if below 7. -Rectal tube was pulled. No nursing concern about melena or hematochezia at this time. Had a couple of BMs which was appeared to be normal. - GI was reconsulted on 02/28/19 due slight drop of H/H --> No new intervention is indicated as Pt's H/H remained stable   - Continue with PPI. - PT/OT rec to SNF.  -We will  to be discharged for rehab  MSSA bacteremia. Blood cultures growing MSSA, most likely her surgical site is the source. TTE and TEE both were negative for any endocarditis  Antibiotics were deescalated to cefazolin-will need 4 weeks of cefazolin.End date 03/29/2019. -PICC line were placed. -ID has been following; placed order for Abx OP/at the SNF and f/u labs already.   AKI.  Resolved now, most likely secondary to shock. -Avoid nephrotoxic. -Continue to monitor.  Hypokalemia.  Resolved  Right ankle fracture s/p recent ORIF -Ortho has seen the patient.  Currently no sign of infection at the surgical site. -Patient may follow-up outpatient  Patient wanted to take Diflucan because she said every time she is on antibiotics she developed some fungal groin infection.  And Diflucan helped her.  Started 3 days course of once daily Diflucan p.o.  -Ask for bowel regimen as she said she is often constipated and has bowel movement every 2 to 3 days.  As needed bowel regimen along with Metamucil added  Objective: Vitals with BMI 03/03/2019 03/03/2019 03/02/2019  Height - - -  Weight - - -  BMI - - -  Systolic [id number] [id number] -  Diastolic 73 57 -  Pulse [id number] 109 106    Last Weight  Most recent update: 03/02/2019  4:51 AM   Weight  141.2 kg (311 lb 3.2 oz)             Examination:  General exam: Appears very comfortable and says she is looking forward to be discharged Respiratory system: Clear to auscultation. Respiratory effort  normal. Cardiovascular system: S1 & S2 heard, RRR. No JVD, murmurs, rubs, gallops or clicks. Gastrointestinal system: Soft, nontender, nondistended, bowel sounds positive. Central nervous system: Alert and oriented. No focal neurological deficits.Symmetric 5 x 5 power. Extremities: Right ankle with Ace wrap, no edema, no cyanosis, pulses intact  Skin: No rashes, lesions or ulcers Psychiatry: Judgement and insight appear normal. Mood & affect appropriate.    DVT  prophylaxis: SCDs Code Status: Full Family Communication: Spoke with husband can on 03/01/2019.  Answered all questions. Disposition Plan: CSW contacted pt's husband, Chrissie Noa, who stated that he does not want his wife to go to [facility]. He only wants her to go to [facility]. Per other CSW notes and doctor notes, [name] denied the patient even after additional paperwork. Patient's husband stated that he would talked with someone over at [facility] and get back with CSW. -Case management is working on it.   -Awaiting skilled nursing placement acceptance  Consultants:   PCCM  ID  Orthopedic  Procedures:  Antimicrobials:  Cefazolin  Data Reviewed: I have personally reviewed following labs and imaging studies  CBC: Recent Labs  Lab 02/25/19 0411 02/25/19 0411 02/26/19 0620 02/26/19 0620 02/27/19 0541 02/28/19 0703 02/28/19 1407 02/28/19 2017 03/01/19 0457  WBC 8.8  --  7.9  --  8.4 5.3  --   --  5.4  NEUTROABS 7.1  --  5.8  --   --   --   --   --   --   HGB 7.5*   < > 7.9*   < > 8.1* 6.9* 7.4* 8.4* 8.2*  HCT 24.8*   < > 26.6*   < > 25.5* 22.8* 24.6* 27.6* 26.2*  MCV 92.5  --  93.7  --  89.2 91.9  --   --  90.0  PLT 303  --  258  --  207 202  --   --  229   < > = values in this interval not displayed.   Basic Metabolic Panel: Recent Labs  Lab 02/25/19 0411 02/26/19 0620 02/27/19 0541 02/28/19 0703 03/01/19 0457  NA 148* 143 142 139 137  K 3.6 3.3* 3.7 3.4* 3.8  CL 120* 114* 115* 111 109  CO2 21* 23 20* 23 23  GLUCOSE 97 99 91 95 95  BUN 35* 27* 22 17 12   CREATININE 0.91 0.89 0.66 0.56 0.58  CALCIUM 8.9 9.1 8.7* 8.6* 8.5*  MG  --  2.1  --  1.5*  --   PHOS  --  2.5  --   --   --    GFR: Estimated Creatinine Clearance: 95.1 mL/min (by C-G formula based on SCr of 0.58 mg/dL). Liver Function Tests: Recent Labs  Lab 02/25/19 0411  AST 28  ALT 6  ALKPHOS 84  BILITOT 0.8  PROT 5.1*  ALBUMIN 2.2*   No results for input(s): LIPASE,  AMYLASE in the last 168 hours. No results for input(s): AMMONIA in the last 168 hours. Coagulation Profile: No results for input(s): INR, PROTIME in the last 168 hours. Cardiac Enzymes: No results for input(s): CKTOTAL, CKMB, CKMBINDEX, TROPONINI in the last 168 hours. BNP (last 3 results) No results for input(s): PROBNP in the last 8760 hours. HbA1C: No results for input(s): HGBA1C in the last 72 hours. CBG: Recent Labs  Lab 02/24/19 1625 02/25/19 1145 03/02/19 1820  GLUCAP 92 90 106*   Lipid Profile: No results for input(s): CHOL, HDL, LDLCALC, TRIG, CHOLHDL, LDLDIRECT in the  last 72 hours. Thyroid Function Tests: No results for input(s): TSH, T4TOTAL, FREET4, T3FREE, THYROIDAB in the last 72 hours. Anemia Panel: No results for input(s): VITAMINB12, FOLATE, FERRITIN, TIBC, IRON, RETICCTPCT in the last 72 hours. Sepsis Labs: No results for input(s): PROCALCITON, LATICACIDVEN in the last 168 hours.  Recent Results (from the past 240 hour(s))  GI pathogen panel by PCR, stool     Status: Abnormal   Collection Time: 02/21/19  4:04 PM   Specimen: Stool  Result Value Ref Range Status   Plesiomonas shigelloides NOT DETECTED NOT DETECTED Final   Yersinia enterocolitica NOT DETECTED NOT DETECTED Final   Vibrio NOT DETECTED NOT DETECTED Final   Enteropathogenic E coli DETECTED (A) NOT DETECTED Final   E coli (ETEC) LT/ST NOT DETECTED NOT DETECTED Final   E coli A999333 by PCR Not applicable NOT DETECTED Final   Cryptosporidium by PCR NOT DETECTED NOT DETECTED Final   Entamoeba histolytica NOT DETECTED NOT DETECTED Final   Adenovirus F 40/41 NOT DETECTED NOT DETECTED Final   Norovirus GI/GII NOT DETECTED NOT DETECTED Final   Sapovirus NOT DETECTED NOT DETECTED Final    Comment: (NOTE) Performed At: [facility] [address] [place], [place] [id number] Rush Farmer MD [id number]    Vibrio cholerae NOT DETECTED NOT DETECTED Final   Campylobacter by PCR NOT DETECTED  NOT DETECTED Final   Salmonella by PCR NOT DETECTED NOT DETECTED Final   E coli (STEC) NOT DETECTED NOT DETECTED Final   Enteroaggregative E coli NOT DETECTED NOT DETECTED Final   Shigella by PCR NOT DETECTED NOT DETECTED Final   Cyclospora cayetanensis NOT DETECTED NOT DETECTED Final   Astrovirus NOT DETECTED NOT DETECTED Final   G lamblia by PCR NOT DETECTED NOT DETECTED Final   Rotavirus A by PCR NOT DETECTED NOT DETECTED Final  CULTURE, BLOOD (ROUTINE X 2) w Reflex to ID Panel     Status: None   Collection Time: 02/22/19 12:59 AM   Specimen: BLOOD  Result Value Ref Range Status   Specimen Description BLOOD RIGHT ANTECUBITAL  Final   Special Requests   Final    BOTTLES DRAWN AEROBIC ONLY Blood Culture adequate volume   Culture   Final    NO GROWTH 5 DAYS Performed at [facility], [address] [place], [place] [address]    Report Status 02/27/2019 FINAL  Final  CULTURE, BLOOD (ROUTINE X 2) w Reflex to ID Panel     Status: None   Collection Time: 02/22/19 12:59 AM   Specimen: BLOOD  Result Value Ref Range Status   Specimen Description BLOOD BLOOD RIGHT HAND  Final   Special Requests   Final    BOTTLES DRAWN AEROBIC AND ANAEROBIC Blood Culture adequate volume   Culture   Final    NO GROWTH 5 DAYS Performed at [facility], [address] [place], [place] [address]    Report Status 02/27/2019 FINAL  Final  Aerobic Culture (superficial specimen)     Status: None   Collection Time: 02/24/19  2:00 PM   Specimen: Wound  Result Value Ref Range Status   Specimen Description   Final    WOUND Performed at [facility], [address], [place], [place] [address]    Special Requests   Final    NONE Performed at [facility] [address] [place], [place] [address]    Gram Stain   Final    RARE WBC PRESENT, PREDOMINANTLY PMN NO ORGANISMS  SEEN    Culture   Final    NO GROWTH 3 DAYS Performed at [facility], [place] [address], [place], [place] [address]    Report Status 02/28/2019 FINAL  Final  MRSA PCR Screening     Status: None   Collection Time: 02/26/19  4:51 AM   Specimen: Nasal Mucosa; Nasopharyngeal  Result Value Ref Range Status   MRSA by PCR NEGATIVE NEGATIVE Final    Comment:        The GeneXpert MRSA Assay (FDA approved for NASAL specimens only), is one component of a comprehensive MRSA colonization surveillance program. It is not intended to diagnose MRSA infection nor to guide or monitor treatment for MRSA infections. Performed at [facility], [address] [place], [place] [address]   Respiratory Panel by RT PCR (Flu A&B, Covid) - Nasopharyngeal Swab     Status: None   Collection Time: 03/01/19 12:59 PM   Specimen: Nasopharyngeal Swab  Result Value Ref Range Status   SARS Coronavirus 2 by RT PCR NEGATIVE NEGATIVE Final    Comment: (NOTE) SARS-CoV-2 target nucleic acids are NOT DETECTED. The SARS-CoV-2 RNA is generally detectable in upper respiratoy specimens during the acute phase of infection. The lowest concentration of SARS-CoV-2 viral copies this assay can detect is 131 copies/mL. A negative result does not preclude SARS-Cov-2 infection and should not be used as the sole basis for treatment or other patient management decisions. A negative result may occur with  improper specimen collection/handling, submission of specimen other than nasopharyngeal swab, presence of viral mutation(s) within the areas targeted by this assay, and inadequate number of viral copies (<131 copies/mL). A negative result must be combined with clinical observations, patient history, and epidemiological information. The expected result is Negative. Fact Sheet for Patients:  PinkCheek.be Fact Sheet for Healthcare Providers:  GravelBags.it This test is not yet ap proved or cleared by the [place] FDA and  has been authorized for  detection and/or diagnosis of SARS-CoV-2 by FDA under an Emergency Use Authorization (EUA). This EUA will remain  in effect (meaning this test can be used) for the duration of the COVID-19 declaration under Section 564(b)(1) of the Act, 21 U.S.C. section 360bbb-3(b)(1), unless the authorization is terminated or revoked sooner.    Influenza A by PCR NEGATIVE NEGATIVE Final   Influenza B by PCR NEGATIVE NEGATIVE Final    Comment: (NOTE) The Xpert Xpress SARS-CoV-2/FLU/RSV assay is intended as an aid in  the diagnosis of influenza from Nasopharyngeal swab specimens and  should not be used as a sole basis for treatment. Nasal washings and  aspirates are unacceptable for Xpert Xpress SARS-CoV-2/FLU/RSV  testing. Fact Sheet for Patients: PinkCheek.be Fact Sheet for Healthcare Providers: GravelBags.it This test is not yet approved or cleared by the [place] FDA and  has been authorized for detection and/or diagnosis of SARS-CoV-2 by  FDA under an Emergency Use Authorization (EUA). This EUA will remain  in effect (meaning this test can be used) for the duration of the  Covid-19 declaration under Section 564(b)(1) of the Act, 21  U.S.C. section 360bbb-3(b)(1), unless the authorization is  terminated or revoked. Performed at [facility], [address] [place], [place] [address]      Radiology Studies: No results found.  Scheduled Meds: . calcium-vitamin D  2 tablet Oral Daily  . chlorhexidine  15 mL Mouth Rinse BID  . Chlorhexidine Gluconate Cloth  6 each Topical Daily  . fluconazole  100  mg Oral Daily  . loratadine  10 mg Oral Daily  . magnesium hydroxide  30 mL Oral Daily  . mouth rinse  15 mL Mouth Rinse q12n4p  . pantoprazole  40 mg Oral BID AC  . psyllium  1 packet Oral Daily  . sodium chloride flush  10-40 mL Intracatheter Q12H  . sodium chloride flush  10-40 mL Intracatheter Q12H   Continuous  Infusions: . sodium chloride Stopped (03/03/19 0619)  .  ceFAZolin (ANCEF) IV 2 g (03/03/19 [id number])  . dextrose 50 mL/hr at 02/27/19 1600     LOS: 11 days   Time spent: 40 minutes.   Thornell Mule, MD [facility] Pager [phone]  If 7PM-7AM, please contact night-coverage [email or web address]  This record has been created using Systems analyst. Errors have been sought and corrected,but may not always be located. Such creation errors do not reflect on the standard of care.

## 2019-03-04 MED ORDER — CEFAZOLIN SODIUM-DEXTROSE 2-4 GM/100ML-% IV SOLN
2.0000 g | Freq: Three times a day (TID) | INTRAVENOUS | Status: DC
  Administered 2019-03-04 – 2019-03-07 (×9): 2 g via INTRAVENOUS
  Filled 2019-03-04 (×11): qty 100

## 2019-03-04 MED ORDER — NYSTATIN 100000 UNIT/GM EX POWD
Freq: Three times a day (TID) | CUTANEOUS | Status: DC
  Filled 2019-03-04: qty 15

## 2019-03-04 NOTE — Progress Notes (Signed)
Physical Therapy Treatment Patient Details Name: Jasmine Morrow MRN: [id number] DOB: 08-Apr-1945 Today's Date: 03/04/2019    History of Present Illness Pt is 74 y.o. female with a known history of recent ankle fracture and ORIF 02/08/2019. Admitted from from [facility] for chest pain. Found to have hypotension and anemia secondary to melena. Admitted for septic/hemorrhagic shock initially requiring pressor support. She was then intubated due to worsening mental status and inability to protect airway.  Extubated on 02/24/19. Now off the pressors.  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  No more melena or hematemesis. Has had total of 6 units of PRBC and 1 unit of fresh frozen plasma.    PT Comments    Pt in bed, ready to participate in session but does not want to attempt sitting EOB "Let's not rock the boat today"  She did put good effort into HEP and was able to increase reps today.  She was inc BM and urine during session but did not want to get cleaned up at end of session stating she did not feel she was finished.  Education provided regarding not waiting too long for skin integrity/breakdown issues.  Voiced understanding.   Follow Up Recommendations  SNF     Equipment Recommendations  None recommended by PT    Recommendations for Other Services       Precautions / Restrictions Precautions Precautions: Fall Precaution Comments: R ankle cast Required Braces or Orthoses: Splint/Cast Splint/Cast: R foot/ankle cast Restrictions Weight Bearing Restrictions: Yes RLE Weight Bearing: Touchdown weight bearing    Mobility  Bed Mobility               General bed mobility comments: "Let's not today"  declined sitting EOB today  Transfers                    Ambulation/Gait                 Stairs             Wheelchair Mobility    Modified Rankin (Stroke Patients Only)       Balance                                             Cognition Arousal/Alertness: Awake/alert Behavior During Therapy: WFL for tasks assessed/performed Overall Cognitive Status: Within Functional Limits for tasks assessed                                        Exercises Other Exercises Other Exercises: BLE A/AAROM as able for toe wiggles, ankle pump L, heel slides, ab/add, SLR, glut sets  quad sets and int/external rotation of hips to encourage neutral position all 2 x 15, SAQ BLE 4 x 10    General Comments        Pertinent Vitals/Pain Pain Assessment: Faces Faces Pain Scale: Hurts little more Pain Location: RLE with movement and general abdominal discomfort Pain Descriptors / Indicators: Guarding;Sore    Home Living                      Prior Function            PT Goals (current goals can now be found in the  care plan section) Progress towards PT goals: Progressing toward goals    Frequency    7X/week      PT Plan Current plan remains appropriate    Co-evaluation              AM-PAC PT "6 Clicks" Mobility   Outcome Measure  Help needed turning from your back to your side while in a flat bed without using bedrails?: A Lot Help needed moving from lying on your back to sitting on the side of a flat bed without using bedrails?: Total Help needed moving to and from a bed to a chair (including a wheelchair)?: Total Help needed standing up from a chair using your arms (e.g., wheelchair or bedside chair)?: Total Help needed to walk in hospital room?: Total Help needed climbing 3-5 steps with a railing? : Total 6 Click Score: 7    End of Session Equipment Utilized During Treatment: Gait belt Activity Tolerance: Patient tolerated treatment well Patient left: in bed;with call bell/phone within reach;with bed alarm set Nurse Communication: Mobility status Pain - Right/Left: Right Pain - part of body: Ankle and joints of foot     Time: [id number] PT Time  Calculation (min) (ACUTE ONLY): 18 min  Charges:  $Therapeutic Exercise: 8-22 mins                    Chesley Noon, PTA 03/04/19, 12:55 PM

## 2019-03-04 NOTE — Progress Notes (Signed)
PROGRESS NOTE  PCCM transfer  Jasmine Morrow  [id number] DOB: 1945-07-10 DOA: 02/20/2019 PCP: Derinda Late, MD   Brief Narrative:  Jasmine Morrow  is a 74 y.o. female with a known history of recent ankle fracture on 1/7 who comes from [place] for chest pain. Found to have hypotension and anemia secondary to melena. Admitted for septic/hemorrhagic shock initially requiring pressor support. She was then intubated due to worsening mental status and inability to protect airway.  Extubated on 02/24/19. Now off the pressors.  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  No more melena or hematemesis.  See if total of 6 units of PRBC and 1 unit of fresh frozen plasma.  Subjective: Patient remained medically stable.  Awaiting SNF discharge.  Assessment & Plan:   Active Problems:   Symptomatic anemia   SOB (shortness of breath)   Gastrointestinal hemorrhage   Sepsis (HCC)   Pressure injury of skin  Septic/hemorrhagic shock.  Resolved. Most likely secondary to diverticular bleed. Patient underwent EGD and colonoscopy along with RBC tagged red cell scan (during this hospitalization) which was negative for any active bleeding. She received multiple blood transfusions, total of 7 PRBC and 1 unit of FFP. Hemoglobin was found to be around 7.4 without any obvious bleeding.--> Received another unit of pRBC. Repeat H/H on D/C remained above 8/26.  -Expected hemoglobin and hematocrit should remain within the stable range.  Do not anticipate frequent blood transfusion either.  Check hemoglobin and hematocrit per facility protocol only. transfuse if below 7. -Rectal tube was pulled. No nursing concern about melena or hematochezia at this time. Had a couple of BMs which was appeared to be normal. - GI was reconsulted on 02/28/19 due slight drop of H/H --> No new intervention is indicated as Pt's H/H remained stable   - Continue with PPI. - PT/OT rec to SNF.  -We will  to be discharged for rehab  MSSA bacteremia. Blood cultures growing MSSA, most likely her surgical site is the source. TTE and TEE both were negative for any endocarditis  Antibiotics were deescalated to cefazolin-will need 4 weeks of cefazolin.End date 03/29/2019. -PICC line were placed. -ID has been following; placed order for Abx OP/at the SNF and f/u labs already.   AKI.  Resolved now, most likely secondary to shock. -Avoid nephrotoxic. -Continue to monitor.  Hypokalemia.  Resolved  Right ankle fracture s/p recent ORIF -Ortho has seen the patient.  Currently no sign of infection at the surgical site. -Patient may follow-up outpatient  Patient wanted to take Diflucan because she said every time she is on antibiotics she developed some fungal groin infection.  And Diflucan helped her.  --Status post Diflucan course.  Stool softener/bowel regimen as needed.  Objective: Vitals with BMI 03/04/2019 03/04/2019 03/04/2019  Height - - -  Weight - - -  BMI - - -  Systolic [id number]  Diastolic 63 57 66  Pulse 97 95 89    Last Weight  Most recent update: 03/02/2019  4:51 AM   Weight  141.2 kg (311 lb 3.2 oz)             Examination:  General exam: Appears very comfortable and says she is looking forward to be discharged Respiratory system: Clear to auscultation. Respiratory effort normal. Cardiovascular system: S1 & S2 heard, RRR. No JVD, murmurs, rubs, gallops or clicks. Gastrointestinal system: Soft, nontender, nondistended, bowel sounds positive. Central nervous system: Alert and oriented. No focal neurological  deficits.Symmetric 5 x 5 power. Extremities: Right ankle with Ace wrap, no edema, no cyanosis, pulses intact  Skin: No rashes, lesions or ulcers Psychiatry: Judgement and insight appear normal. Mood & affect appropriate.    DVT prophylaxis: SCDs Code Status: Full Family Communication: Spoke with husband can on 03/01/2019.  Answered all questions. Disposition Plan:  CSW contacted pt's husband, Chrissie Noa, who stated that he does not want his wife to go to [facility]. He only wants her to go to [facility]. Per other CSW, [facility] declined. -Case management is working other facilities -Awaiting skilled nursing placement acceptance while patient is medically stable to be discharged to SNF  Consultants:   PCCM  ID  Orthopedic  Procedures:  Antimicrobials:  Cefazolin  Data Reviewed: I have personally reviewed following labs and imaging studies  CBC: Recent Labs  Lab 02/26/19 0620 02/26/19 0620 02/27/19 0541 02/28/19 0703 02/28/19 1407 02/28/19 2017 03/01/19 0457  WBC 7.9  --  8.4 5.3  --   --  5.4  NEUTROABS 5.8  --   --   --   --   --   --   HGB 7.9*   < > 8.1* 6.9* 7.4* 8.4* 8.2*  HCT 26.6*   < > 25.5* 22.8* 24.6* 27.6* 26.2*  MCV 93.7  --  89.2 91.9  --   --  90.0  PLT 258  --  207 202  --   --  229   < > = values in this interval not displayed.   Basic Metabolic Panel: Recent Labs  Lab 02/26/19 0620 02/27/19 0541 02/28/19 0703 03/01/19 0457  NA 143 142 139 137  K 3.3* 3.7 3.4* 3.8  CL 114* 115* 111 109  CO2 23 20* 23 23  GLUCOSE 99 91 95 95  BUN 27* 22 17 12   CREATININE 0.89 0.66 0.56 0.58  CALCIUM 9.1 8.7* 8.6* 8.5*  MG 2.1  --  1.5*  --   PHOS 2.5  --   --   --    GFR: Estimated Creatinine Clearance: 95.1 mL/min (by C-G formula based on SCr of 0.58 mg/dL). Liver Function Tests: No results for input(s): AST, ALT, ALKPHOS, BILITOT, PROT, ALBUMIN in the last 168 hours. No results for input(s): LIPASE, AMYLASE in the last 168 hours. No results for input(s): AMMONIA in the last 168 hours. Coagulation Profile: No results for input(s): INR, PROTIME in the last 168 hours. Cardiac Enzymes: No results for input(s): CKTOTAL, CKMB, CKMBINDEX, TROPONINI in the last 168 hours. BNP (last 3 results) No results for input(s): PROBNP in the last 8760 hours. HbA1C: No results for input(s): HGBA1C in the last 72  hours. CBG: Recent Labs  Lab 03/02/19 1820  GLUCAP 106*   Lipid Profile: No results for input(s): CHOL, HDL, LDLCALC, TRIG, CHOLHDL, LDLDIRECT in the last 72 hours. Thyroid Function Tests: No results for input(s): TSH, T4TOTAL, FREET4, T3FREE, THYROIDAB in the last 72 hours. Anemia Panel: No results for input(s): VITAMINB12, FOLATE, FERRITIN, TIBC, IRON, RETICCTPCT in the last 72 hours. Sepsis Labs: No results for input(s): PROCALCITON, LATICACIDVEN in the last 168 hours.  Recent Results (from the past 240 hour(s))  Aerobic Culture (superficial specimen)     Status: None   Collection Time: 02/24/19  2:00 PM   Specimen: Wound  Result Value Ref Range Status   Specimen Description   Final    WOUND Performed at [facility], [address] [place], [place] [address]    Special Requests  Final    NONE Performed at [facility], [address] [place], [place] [address]    Gram Stain   Final    RARE WBC PRESENT, PREDOMINANTLY PMN NO ORGANISMS SEEN    Culture   Final    NO GROWTH 3 DAYS Performed at [facility] [address] [place], [place] [address]    Report Status 02/28/2019 FINAL  Final  MRSA PCR Screening     Status: None   Collection Time: 02/26/19  4:51 AM   Specimen: Nasal Mucosa; Nasopharyngeal  Result Value Ref Range Status   MRSA by PCR NEGATIVE NEGATIVE Final    Comment:        The GeneXpert MRSA Assay (FDA approved for NASAL specimens only), is one component of a comprehensive MRSA colonization surveillance program. It is not intended to diagnose MRSA infection nor to guide or monitor treatment for MRSA infections. Performed at [facility], [address] [place], [place] [address]   Respiratory Panel by RT PCR (Flu A&B, Covid) - Nasopharyngeal Swab     Status: None   Collection Time: 03/01/19 12:59 PM   Specimen: Nasopharyngeal Swab  Result Value Ref Range Status   SARS Coronavirus 2 by RT PCR NEGATIVE  NEGATIVE Final    Comment: (NOTE) SARS-CoV-2 target nucleic acids are NOT DETECTED. The SARS-CoV-2 RNA is generally detectable in upper respiratoy specimens during the acute phase of infection. The lowest concentration of SARS-CoV-2 viral copies this assay can detect is 131 copies/mL. A negative result does not preclude SARS-Cov-2 infection and should not be used as the sole basis for treatment or other patient management decisions. A negative result may occur with  improper specimen collection/handling, submission of specimen other than nasopharyngeal swab, presence of viral mutation(s) within the areas targeted by this assay, and inadequate number of viral copies (<131 copies/mL). A negative result must be combined with clinical observations, patient history, and epidemiological information. The expected result is Negative. Fact Sheet for Patients:  PinkCheek.be Fact Sheet for Healthcare Providers:  GravelBags.it This test is not yet ap proved or cleared by the [place] FDA and  has been authorized for detection and/or diagnosis of SARS-CoV-2 by FDA under an Emergency Use Authorization (EUA). This EUA will remain  in effect (meaning this test can be used) for the duration of the COVID-19 declaration under Section 564(b)(1) of the Act, 21 U.S.C. section 360bbb-3(b)(1), unless the authorization is terminated or revoked sooner.    Influenza A by PCR NEGATIVE NEGATIVE Final   Influenza B by PCR NEGATIVE NEGATIVE Final    Comment: (NOTE) The Xpert Xpress SARS-CoV-2/FLU/RSV assay is intended as an aid in  the diagnosis of influenza from Nasopharyngeal swab specimens and  should not be used as a sole basis for treatment. Nasal washings and  aspirates are unacceptable for Xpert Xpress SARS-CoV-2/FLU/RSV  testing. Fact Sheet for Patients: PinkCheek.be Fact Sheet for Healthcare Providers:  GravelBags.it This test is not yet approved or cleared by the [place] FDA and  has been authorized for detection and/or diagnosis of SARS-CoV-2 by  FDA under an Emergency Use Authorization (EUA). This EUA will remain  in effect (meaning this test can be used) for the duration of the  Covid-19 declaration under Section 564(b)(1) of the Act, 21  U.S.C. section 360bbb-3(b)(1), unless the authorization is  terminated or revoked. Performed at [facility], [address], [place],  [address]      Radiology Studies: No results found.  Scheduled Meds: . calcium-vitamin D  2 tablet Oral Daily  . carvedilol  3.125 mg Oral BID WC  . chlorhexidine  15 mL Mouth Rinse BID  . Chlorhexidine Gluconate Cloth  6 each Topical Daily  . loratadine  10 mg Oral Daily  . magnesium hydroxide  30 mL Oral Daily  . mouth rinse  15 mL Mouth Rinse q12n4p  . pantoprazole  40 mg Oral BID AC  . psyllium  1 packet Oral Daily  . sodium chloride flush  10-40 mL Intracatheter Q12H  . sodium chloride flush  10-40 mL Intracatheter Q12H   Continuous Infusions: . sodium chloride Stopped (03/03/19 1606)  .  ceFAZolin (ANCEF) IV 2 g (03/04/19 1452)  . dextrose 50 mL/hr at 02/27/19 1600     LOS: 12 days   Time spent: 40 minutes.   Thornell Mule, MD [facility] Pager [phone]  If 7PM-7AM, please contact night-coverage [email or web address]  This record has been created using Systems analyst. Errors have been sought and corrected,but may not always be located. Such creation errors do not reflect on the standard of care.

## 2019-03-04 NOTE — Care Management Important Message (Signed)
Important Message  Patient Details  Name: Jasmine Morrow MRN: [id number] Date of Birth: 1945/03/04   Medicare Important Message Given:  Yes     Dannette Barbara 03/04/2019, 11:40 AM

## 2019-03-04 NOTE — Consult Note (Addendum)
Pharmacy Antibiotic Note  Jasmine Morrow is a 74 y.o. female admitted on 02/20/2019 with sepsis of unknown source. Blood cultures with MSSA. Plan to evaluate surgical site (s/p right ankle fracture with ORIF). Pharmacy has been consulted for cefazolin dosing. ID following.    Plan: Day 13 of abx. Will continue with Cefazolin 2 g IV q8h. Repeat blood cultures remain negative. End date planned for 2/26.   Height: 5\' 8"  (172.7 cm) Weight: (!) 311 lb 3.2 oz (141.2 kg) IBW/kg (Calculated) : 63.9  Temp (24hrs), Avg:98.1 F (36.7 C), Min:97.6 F (36.4 C), Max:98.8 F (37.1 C)  Recent Labs  Lab 02/26/19 0620 02/27/19 0541 02/28/19 0703 03/01/19 0457  WBC 7.9 8.4 5.3 5.4  CREATININE 0.89 0.66 0.56 0.58    Estimated Creatinine Clearance: 95.1 mL/min (by C-G formula based on SCr of 0.58 mg/dL).    Allergies  Allergen Reactions  . Celecoxib Nausea Only  . Hydrocodone-Acetaminophen Nausea And Vomiting  . Meloxicam Nausea Only  . Morphine Sulfate Er Beads Itching  . Naproxen Swelling  . Rofecoxib Nausea Only  . Sulfa Antibiotics Other (See Comments)    Reaction: unknown  . Ciprofloxacin Rash  . Nickel Rash  . Tramadol-Acetaminophen Nausea Only and Rash    Antimicrobials this admission: Vancomycin 1/20 >> 1/21 Cefepime 1/20 >> 1/21 Cefazolin 1/21 >>   Microbiology results: 1/20 BCx: MSSA 1/20 UCx: insignificant growth 1/22 Bcx: pending 1/21 GI Panel: Ecoli   Thank you for allowing pharmacy to be a part of this patient's care.  Oswald Hillock, PharmD, BCPS 03/04/2019 11:51 AM

## 2019-03-04 NOTE — Plan of Care (Signed)

## 2019-03-04 NOTE — TOC Progression Note (Signed)
Transition of Care [facility]) - Progression Note    Patient Details  Name: Jasmine Morrow MRN: [id number] Date of Birth: 01-21-1946  Transition of [facility]) CM/SW Contact  Shelbie Ammons, RN Phone Number: 03/04/2019, 1:56 PM  Clinical Narrative:   RNCM placed call to [facility] and left a message for return call. Received return call from CM who reported she had already talked with patient's husband and that patient would be brought up for review tomorrow 2/1, she also reports to this CM that because they also have inpatient even if they do accept patient, if a patient comes in through the ED that needs that bed they can take it.  RNCM returned call to husband to discuss as well that no contact has been made yet with [facility] health care. Husband was able to provide information. Placed call to Cleophus Molt with [facility] and discussed case, information faxed to [phone].     Expected Discharge Plan: [place] Barriers to Discharge: Continued Medical Work up  Expected Discharge Plan and Services Expected Discharge Plan: [place]   Discharge Planning Services: CM Consult   Living arrangements for the past 2 months: [place] Expected Discharge Date: 03/02/19                                     Social Determinants of Health (SDOH) Interventions    Readmission Risk Interventions No flowsheet data found.

## 2019-03-05 LAB — RESPIRATORY PANEL BY RT PCR (FLU A&B, COVID)
Influenza A by PCR: NEGATIVE
Influenza B by PCR: NEGATIVE
SARS Coronavirus 2 by RT PCR: NEGATIVE

## 2019-03-05 MED ORDER — FLUCONAZOLE 50 MG PO TABS
150.0000 mg | ORAL_TABLET | Freq: Every day | ORAL | Status: DC | PRN
  Administered 2019-03-05 – 2019-03-06 (×2): 150 mg via ORAL
  Filled 2019-03-05 (×5): qty 3

## 2019-03-05 NOTE — TOC Progression Note (Signed)
Transition of Care [facility]) - Progression Note    Patient Details  Name: Jasmine Morrow MRN: [id number] Date of Birth: Jun 11, 1945  [facility]) CM/SW Contact  Shelbie Ammons, RN Phone Number: 03/05/2019, 3:28 PM  Clinical Narrative:  RNCM spoke with patient's husband as well as son on the phone. Did explain to both that discharge order had been put in but that this CM was waiting to hear from [facility] and that it had been confirmed that the information had been received for review. Son verbalizing that he has major concerns over patient going to [facility] due to reviews he has read. Son questioning other facilities in [place], voice mails left for [facility], [facility]. [facility] and [facility] both said no. [facility] sent to their nursing director for review. RNCM will follow up.     Expected Discharge Plan: [place] Barriers to Discharge: Continued Medical Work up  Expected Discharge Plan and Services Expected Discharge Plan: [place]   Discharge Planning Services: CM Consult   Living arrangements for the past 2 months: [place] Expected Discharge Date: 03/05/19                                     Social Determinants of Health (SDOH) Interventions    Readmission Risk Interventions No flowsheet data found.

## 2019-03-05 NOTE — Progress Notes (Signed)
Physical Therapy Treatment Patient Details Name: Jasmine Morrow MRN: [id number] DOB: 07-Jun-1945 Today's Date: 03/05/2019    History of Present Illness Pt is 74 y.o. female with a known history of recent ankle fracture and ORIF 02/08/2019. Admitted from from [facility] for chest pain. Found to have hypotension and anemia secondary to melena. Admitted for septic/hemorrhagic shock initially requiring pressor support. She was then intubated due to worsening mental status and inability to protect airway.  Extubated on 02/24/19. Now off the pressors.  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  No more melena or hematemesis. Has had total of 6 units of PRBC and 1 unit of fresh frozen plasma.    PT Comments    Pt was supine in bed with HOB slightly elevated upon arriving. She agrees to PT/OT co-treat and is cooperative throughout. Pt does get slightly anxious but with relaxation techniques/breathing techniques calms. She agrees to EOB sitting. Max assist +2 to safely transition from supine >R sidelyinh> to short sit. Increased time to perform with assistance with BLEs and upper body support. Pt sat EOB x 18 minutes and tolerated well. HR did elevate from 108 to 120s but pt tolerated well. She required max assist +2 to return to supine and be repositioned to [facility]. Therapist reviewed there ex previously issued (see list below) and therapist gave her a handout. Pt is progressing with PT. Continued recommendation for SNF at d/c.      Follow Up Recommendations  SNF     Equipment Recommendations  None recommended by PT    Recommendations for Other Services       Precautions / Restrictions Precautions Precautions: Fall Precaution Comments: R ankle cast Required Braces or Orthoses: Splint/Cast Splint/Cast: R foot/ankle cast Restrictions Weight Bearing Restrictions: Yes RLE Weight Bearing: Touchdown weight bearing    Mobility  Bed Mobility Overal bed mobility: Needs  Assistance Bed Mobility: Supine to Sit;Rolling Rolling: +2 for physical assistance;+2 for safety/equipment;Max assist   Supine to sit: Max assist;+2 for physical assistance Sit to supine: Max assist;+2 for physical assistance;+2 for safety/equipment   General bed mobility comments: Pt was able to roll R to short sit with + 2 assist for safety and pt's deficits. Pt does get slightly anxious prior to performing but was willing with encouragement. Vcs for technique and sequencing. one person assisted in front of pt and one behind for safety. pt required assistance with BLEs and trunk control  Transfers                 General transfer comment: unsafe to progress to transfer at this time 2/2 weakness  Ambulation/Gait                 Stairs             Wheelchair Mobility    Modified Rankin (Stroke Patients Only)       Balance Overall balance assessment: Needs assistance;History of Falls Sitting-balance support: Feet supported;Bilateral upper extremity supported;No upper extremity supported Sitting balance-Leahy Scale: Poor Sitting balance - Comments: pt sat EOB x 18 minutes total throughout session with varied amount of assistance depending on UE support or not. OT had pt brushing hair and non UE support activities. pt with fair balance with BUE and BLEs supported but poor without UE support Postural control: Posterior lean  Cognition Arousal/Alertness: Awake/alert Behavior During Therapy: WFL for tasks assessed/performed Overall Cognitive Status: Within Functional Limits for tasks assessed                                 General Comments: Pt much more alert and cooperative throughout session. does get anxious but with breathing techniques calms      Exercises General Exercises - Lower Extremity Ankle Circles/Pumps: Left;AROM;10 reps;Supine Quad Sets: AROM;Both;10 reps Long Arc Quad:  AROM;Left;AAROM;Right;10 reps;Seated(pt requires assistance with LAQ with RLE through full ROM)    General Comments        Pertinent Vitals/Pain Pain Assessment: 0-10 Pain Score: 3  Faces Pain Scale: Hurts a little bit Pain Location: R ankle with movements Pain Descriptors / Indicators: Guarding;Sharp;Sore Pain Intervention(s): Monitored during session    Home Living                      Prior Function            PT Goals (current goals can now be found in the care plan section) Acute Rehab PT Goals Patient Stated Goal: " I'm feeling pretty good today" Progress towards PT goals: Progressing toward goals    Frequency    7X/week      PT Plan Current plan remains appropriate    Co-evaluation PT/OT/SLP Co-Evaluation/Treatment: Yes Reason for Co-Treatment: Complexity of the patient's impairments (multi-system involvement);For patient/therapist safety PT goals addressed during session: Mobility/safety with mobility;Balance;Strengthening/ROM        AM-PAC PT "6 Clicks" Mobility   Outcome Measure  Help needed turning from your back to your side while in a flat bed without using bedrails?: A Lot Help needed moving from lying on your back to sitting on the side of a flat bed without using bedrails?: Total Help needed moving to and from a bed to a chair (including a wheelchair)?: Total Help needed standing up from a chair using your arms (e.g., wheelchair or bedside chair)?: Total Help needed to walk in hospital room?: Total Help needed climbing 3-5 steps with a railing? : Total 6 Click Score: 7    End of Session   Activity Tolerance: Patient tolerated treatment well Patient left: in bed;with call bell/phone within reach;with bed alarm set Nurse Communication: Mobility status PT Visit Diagnosis: Muscle weakness (generalized) (M62.81);History of falling (Z91.81);Difficulty in walking, not elsewhere classified (R26.2);Pain Pain - Right/Left: Right Pain - part  of body: Ankle and joints of foot     Time: 1135-1158 PT Time Calculation (min) (ACUTE ONLY): 23 min  Charges:  $Therapeutic Activity: 8-22 mins                     Julaine Fusi PTA 03/05/19, 12:39 PM

## 2019-03-05 NOTE — Progress Notes (Signed)
Occupational Therapy Treatment Patient Details Name: Jasmine Morrow MRN: [id number] DOB: [birth year] Today's Date: 03/05/2019    History of present illness Pt is 74 y.o. female with a known history of recent ankle fracture and ORIF 02/08/2019. Admitted from from [facility] for chest pain. Found to have hypotension and anemia secondary to melena. Admitted for septic/hemorrhagic shock initially requiring pressor support. She was then intubated due to worsening mental status and inability to protect airway.  Extubated on 02/24/19. Now off the pressors.  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  No more melena or hematemesis. Has had total of 6 units of PRBC and 1 unit of fresh frozen plasma.   OT comments  Pt seen for OT tx this date. CoTx with Pt conducted d/t pt with very low fxl activity tolerance. Pt demos some improved mentation and motivation this date including willingness to come to EOB sitting. Some mild anxiety surrounding mobilization noted. Pt tolerates EOB sitting x18 mins to participate in therapeutic activities and some aspects of self care. Pt demos POOR sitting balance requiring intermittent MIN A for static balance and MOD A for more dynamic/functional task participation. Pt requires MAX A x2 for sup<>sit. Overall, anticipate that SNF is still safest d/c dispo.    Follow Up Recommendations  SNF    Equipment Recommendations  Other (comment)(defer)    Recommendations for Other Services      Precautions / Restrictions Precautions Precautions: Fall Precaution Comments: R ankle cast Required Braces or Orthoses: Splint/Cast Splint/Cast: R foot/ankle cast Restrictions Weight Bearing Restrictions: Yes RLE Weight Bearing: Touchdown weight bearing Other Position/Activity Restrictions: monitor HR-increases to 120s with modest activity (ex: sup<>sit)       Mobility Bed Mobility Overal bed mobility: Needs Assistance Bed Mobility: Supine to  Sit;Rolling Rolling: +2 for physical assistance;+2 for safety/equipment;Max assist   Supine to sit: +2 for physical assistance;Max assist Sit to supine: +2 for physical assistance;+2 for safety/equipment;Max assist   General bed mobility comments: Pt requires MOD verbal/tactile cues to sequence hand placement and foot placement to assist with rolling  Transfers                 General transfer comment: deferred d/t weakness/low tolerance for EOB sitting at this time.    Balance Overall balance assessment: Needs assistance;History of Falls Sitting-balance support: Feet supported;Bilateral upper extremity supported;No upper extremity supported Sitting balance-Leahy Scale: Poor Sitting balance - Comments: pt sat approximately 18 mins with intermittent CGA to MIN A required for static balance, MOD A required for fxl activity, pt with some brief periods of supporting herself with UEs to maintain balance. Postural control: Posterior lean                                 ADL either performed or assessed with clinical judgement   ADL Overall ADL's : Needs assistance/impaired     Grooming: Brushing hair;Minimal assistance Grooming Details (indicate cue type and reason): Pt able to completes brushing lateral and anterior portions of her hair I'ly, however, limited UE ROM to reach posterior aspect, requring MIN A while in EOB sitting. In addition, intermittent MIN A to maintain sitting balance at EOB.                                     Vision Baseline Vision/History: Wears glasses Wears  Glasses: At all times Patient Visual Report: No change from baseline     Perception     Praxis      Cognition Arousal/Alertness: Awake/alert Behavior During Therapy: WFL for tasks assessed/performed Overall Cognitive Status: Within Functional Limits for tasks assessed                                 General Comments: Pt much more alert this date,  some minimal anxiety surrounding mobilizing, but overall tolerates well and able to appropriately follow commands.        Exercises Exercises: General Lower Extremity General Exercises - Lower Extremity Ankle Circles/Pumps: Left;AROM;10 reps;Supine Quad Sets: AROM;Both;10 reps Long Arc Quad: AROM;Left;AAROM;Right;10 reps;Seated(pt requires assistance with LAQ with RLE through full ROM) Other Exercises Other Exercises: OT facilitates pt participation in postural extension exercise for 1 set x10 reps with 4-5 second hold on each rep to improve surface aread for breathing as well as improve trunk strength as it pertains to safety and balance in sitting for fxl task participation   Shoulder Instructions       General Comments      Pertinent Vitals/ Pain       Pain Assessment: 0-10 Pain Score: 3  Faces Pain Scale: Hurts a little bit Pain Location: R LE with mobilization Pain Descriptors / Indicators: Guarding;Sore Pain Intervention(s): Monitored during session;Repositioned(elevated R LE on pillow at end of session)  Home Living                                          Prior Functioning/Environment              Frequency  Min 2X/week        Progress Toward Goals  OT Goals(current goals can now be found in the care plan section)  Progress towards OT goals: Progressing toward goals  Acute Rehab OT Goals Patient Stated Goal: to get better at moving OT Goal Formulation: With patient Time For Goal Achievement: 03/13/19  Plan Discharge plan remains appropriate;Frequency remains appropriate    Co-evaluation    PT/OT/SLP Co-Evaluation/Treatment: Yes Reason for Co-Treatment: Complexity of the patient's impairments (multi-system involvement);For patient/therapist safety PT goals addressed during session: Mobility/safety with mobility;Balance;Strengthening/ROM OT goals addressed during session: ADL's and self-care;Proper use of Adaptive equipment and  DME;Strengthening/ROM      AM-PAC OT "6 Clicks" Daily Activity     Outcome Measure   Help from another person eating meals?: None Help from another person taking care of personal grooming?: A Little Help from another person toileting, which includes using toliet, bedpan, or urinal?: A Lot Help from another person bathing (including washing, rinsing, drying)?: A Lot Help from another person to put on and taking off regular upper body clothing?: A Little Help from another person to put on and taking off regular lower body clothing?: A Lot 6 Click Score: 16    End of Session    OT Visit Diagnosis: Other abnormalities of gait and mobility (R26.89);History of falling (Z91.81);Pain Pain - Right/Left: Right Pain - part of body: Ankle and joints of foot   Activity Tolerance Patient tolerated treatment well   Patient Left in bed;with call bell/phone within reach;with bed alarm set   Nurse Communication          Time: [id number] OT Time Calculation (min): 26 min  Charges: OT General Charges $OT Visit: 1 Visit OT Treatments $Self Care/Home Management : 8-22 mins  Gerrianne Scale, [place], OTR/L ascom [phone] 03/05/19, 4:01 PM

## 2019-03-05 NOTE — TOC Progression Note (Signed)
Transition of Care [facility]) - Progression Note    Patient Details  Name: Jasmine Morrow MRN: [id number] Date of Birth: 1945-07-26  Transition of Care [facility]) CM/SW Contact  Shelbie Ammons, RN Phone Number: 03/05/2019, 1:35 PM  Clinical Narrative:   RNCM received call back from husband stating that he has spoken with his son and that they would request CM to contact one more facility, [facility] in [place]. Husband is unable to give contact information but CM did locate and after two calls was able to speak with [facility] admissions co-ordinator who after discussion did give fax number of [phone]. Discussed that patient is ready for discharge so as soon as possible would be good.     Expected Discharge Plan: [place] Barriers to Discharge: Continued Medical Work up  Expected Discharge Plan and Services Expected Discharge Plan: [place]   Discharge Planning Services: CM Consult   Living arrangements for the past 2 months: [place] Expected Discharge Date: 03/05/19                                     Social Determinants of Health (SDOH) Interventions    Readmission Risk Interventions No flowsheet data found.

## 2019-03-05 NOTE — TOC Progression Note (Signed)
Transition of Care [facility]) - Progression Note    Patient Details  Name: Jasmine Morrow MRN: [id number] Date of Birth: February 09, 1945  Transition of Care [facility]) CM/SW Contact  Shelbie Ammons, RN Phone Number: 03/05/2019, 12:09 PM  Clinical Narrative:   RNCM spoke with patient at bedside and gave update that this CM had contacted [facility] and left messages for return calls from both regarding possible bed offers.   RNCM placed call to [facility] they still do not have bed, [facility] they still do not have a bed. RNCM placed call to Francesca Jewett who is [facility] liaison and learned that they have bed availability at there [facility], [place] and [facility] facilities. Received return call from [facility] with questions regarding patient's participation in therapy as well as some of her lab results. Earnest Bailey reported they couldn't offer anything today but they would re-evaluate her again tomorrow. Placed additional call to Cleophus Molt at [facility] who reports at this time they do not have any beds to offer, they only have semi-private available and due to the infection she is being treated for they will not be able to put her in semi-private.   RNCM attempted to speak with patient again at bedside however she is working with PT, asked if it would be ok to contact husband to which she is agreeable. Placed call to husband and reported all findings with facilities as well as the fact that beds are still available at the [facility] facilities. Husband reports that he will need to call and speak with his son. Did review with husband that patient is ready for discharge and a choice will need to be made. Awaiting call back.     Expected Discharge Plan: [place] Barriers to Discharge: Continued Medical Work up  Expected Discharge Plan and Services Expected Discharge Plan: [place]   Discharge Planning Services: CM Consult   Living arrangements for the past 2  months: [place] Expected Discharge Date: 03/05/19                                     Social Determinants of Health (SDOH) Interventions    Readmission Risk Interventions No flowsheet data found.

## 2019-03-05 NOTE — Progress Notes (Signed)
PROGRESS NOTE  PCCM transfer  Jasmine Morrow  [id number] DOB: 1945/10/03 DOA: 02/20/2019 PCP: Derinda Late, MD   Brief Narrative:  Jasmine Morrow  is a 74 y.o. female with a known history of recent ankle fracture on 1/7 who comes from [place] for chest pain. Found to have hypotension and anemia secondary to melena. Admitted for septic/hemorrhagic shock initially requiring pressor support. She was then intubated due to worsening mental status and inability to protect airway.  Extubated on 02/24/19. Now off the pressors.  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  No more melena or hematemesis.  See if total of 6 units of PRBC and 1 unit of fresh frozen plasma.  Subjective: No acute issues.  Awaiting SNF discharge.  Assessment & Plan:   Active Problems:   Symptomatic anemia   SOB (shortness of breath)   Gastrointestinal hemorrhage   Sepsis (HCC)   Pressure injury of skin  Septic/hemorrhagic shock.  Resolved. Most likely secondary to diverticular bleed. Patient underwent EGD and colonoscopy along with RBC tagged red cell scan (during this hospitalization) which was negative for any active bleeding. She received multiple blood transfusions, total of 7 PRBC and 1 unit of FFP. Hemoglobin was found to be around 7.4 without any obvious bleeding.--> Received another unit of pRBC. Repeat H/H on D/C remained above 8/26.  -Expected hemoglobin and hematocrit should remain within the stable range.  Do not anticipate frequent blood transfusion either.  Check hemoglobin and hematocrit per facility protocol only. transfuse if below 7. -Rectal tube was pulled. No nursing concern about melena or hematochezia at this time. Had a couple of BMs which was appeared to be normal. - GI was reconsulted on 02/28/19 due slight drop of H/H --> No new intervention is indicated as Pt's H/H remained stable   - Continue with PPI. - PT/OT rec to SNF.  -We will to be discharged  for rehab  MSSA bacteremia. Blood cultures growing MSSA, most likely her surgical site is the source. TTE and TEE both were negative for any endocarditis  Antibiotics were deescalated to cefazolin-will need 4 weeks of cefazolin.End date 03/29/2019. -PICC line were placed. -ID has been following; placed order for Abx OP/at the SNF and f/u labs already.   AKI.  Resolved now, most likely secondary to shock. -Avoid nephrotoxic. -Continue to monitor.  Hypokalemia.  Resolved  Right ankle fracture s/p recent ORIF -Ortho has seen the patient.  Currently no sign of infection at the surgical site. -Patient may follow-up outpatient  Patient wanted to take Diflucan as needed because she said every time she is on antibiotics she developed some fungal groin infection.  Used to take Diflucan as needed and Diflucan helped her.    Stool softener/bowel regimen as needed.  Objective: Vitals with BMI 03/05/2019 03/05/2019 03/05/2019  Height - - -  Weight - - -  BMI - - -  Systolic Q000111Q Q000111Q [id number]  Diastolic 68 61 72  Pulse [id number] 97 102    Last Weight  Most recent update: 03/02/2019  4:51 AM   Weight  141.2 kg (311 lb 3.2 oz)             Examination:  General exam: Appears very comfortable and says she is looking forward to be discharged Respiratory system: Clear to auscultation. Respiratory effort normal. Cardiovascular system: S1 & S2 heard, RRR. No JVD, murmurs, rubs, gallops or clicks. Gastrointestinal system: Soft, nontender, nondistended, bowel sounds positive. Central nervous system: Alert and  oriented. No focal neurological deficits.Symmetric 5 x 5 power. Extremities: Right ankle with Ace wrap, no edema, no cyanosis, pulses intact  Skin: No rashes, lesions or ulcers Psychiatry: Judgement and insight appear normal. Mood & affect appropriate.    DVT prophylaxis: SCDs Code Status: Full Family Communication: Spoke with husband can on 03/01/2019.  Answered all questions. Disposition Plan: CSW  contacted pt's husband, Chrissie Noa, who stated that he does not want his wife to go to [facility]. He only wants her to go to [facility]. Per other CSW, [facility] declined. -Case management is working on it -Awaiting skilled nursing placement acceptance while patient is medically stable to be discharged to SNF  Consultants:   PCCM  ID  Orthopedic  Procedures:  Antimicrobials:  Cefazolin  Data Reviewed: I have personally reviewed following labs and imaging studies  CBC: Recent Labs  Lab 02/27/19 0541 02/28/19 0703 02/28/19 1407 02/28/19 2017 03/01/19 0457  WBC 8.4 5.3  --   --  5.4  HGB 8.1* 6.9* 7.4* 8.4* 8.2*  HCT 25.5* 22.8* 24.6* 27.6* 26.2*  MCV 89.2 91.9  --   --  90.0  PLT 207 202  --   --  Q000111Q   Basic Metabolic Panel: Recent Labs  Lab 02/27/19 0541 02/28/19 0703 03/01/19 0457  NA 142 139 137  K 3.7 3.4* 3.8  CL 115* 111 109  CO2 20* 23 23  GLUCOSE 91 95 95  BUN 22 17 12   CREATININE 0.66 0.56 0.58  CALCIUM 8.7* 8.6* 8.5*  MG  --  1.5*  --    GFR: Estimated Creatinine Clearance: 95.1 mL/min (by C-G formula based on SCr of 0.58 mg/dL). Liver Function Tests: No results for input(s): AST, ALT, ALKPHOS, BILITOT, PROT, ALBUMIN in the last 168 hours. No results for input(s): LIPASE, AMYLASE in the last 168 hours. No results for input(s): AMMONIA in the last 168 hours. Coagulation Profile: No results for input(s): INR, PROTIME in the last 168 hours. Cardiac Enzymes: No results for input(s): CKTOTAL, CKMB, CKMBINDEX, TROPONINI in the last 168 hours. BNP (last 3 results) No results for input(s): PROBNP in the last 8760 hours. HbA1C: No results for input(s): HGBA1C in the last 72 hours. CBG: Recent Labs  Lab 03/02/19 1820  GLUCAP 106*   Lipid Profile: No results for input(s): CHOL, HDL, LDLCALC, TRIG, CHOLHDL, LDLDIRECT in the last 72 hours. Thyroid Function Tests: No results for input(s): TSH, T4TOTAL, FREET4, T3FREE, THYROIDAB in the  last 72 hours. Anemia Panel: No results for input(s): VITAMINB12, FOLATE, FERRITIN, TIBC, IRON, RETICCTPCT in the last 72 hours. Sepsis Labs: No results for input(s): PROCALCITON, LATICACIDVEN in the last 168 hours.  Recent Results (from the past 240 hour(s))  Aerobic Culture (superficial specimen)     Status: None   Collection Time: 02/24/19  2:00 PM   Specimen: Wound  Result Value Ref Range Status   Specimen Description   Final    WOUND Performed at [facility], [address] [place], [place] [address]    Special Requests   Final    NONE Performed at [facility], [address] [place], [place] [address]    Gram Stain   Final    RARE WBC PRESENT, PREDOMINANTLY PMN NO ORGANISMS SEEN    Culture   Final    NO GROWTH 3 DAYS Performed at [facility], [place] [address], [place], [place] [address]    Report Status 02/28/2019 FINAL  Final  MRSA PCR  Screening     Status: None   Collection Time: 02/26/19  4:51 AM   Specimen: Nasal Mucosa; Nasopharyngeal  Result Value Ref Range Status   MRSA by PCR NEGATIVE NEGATIVE Final    Comment:        The GeneXpert MRSA Assay (FDA approved for NASAL specimens only), is one component of a comprehensive MRSA colonization surveillance program. It is not intended to diagnose MRSA infection nor to guide or monitor treatment for MRSA infections. Performed at [facility], [address] [place], [place] [address]   Respiratory Panel by RT PCR (Flu A&B, Covid) - Nasopharyngeal Swab     Status: None   Collection Time: 03/01/19 12:59 PM   Specimen: Nasopharyngeal Swab  Result Value Ref Range Status   SARS Coronavirus 2 by RT PCR NEGATIVE NEGATIVE Final    Comment: (NOTE) SARS-CoV-2 target nucleic acids are NOT DETECTED. The SARS-CoV-2 RNA is generally detectable in upper respiratoy specimens during the acute phase of infection. The lowest concentration of SARS-CoV-2 viral copies this assay can detect  is 131 copies/mL. A negative result does not preclude SARS-Cov-2 infection and should not be used as the sole basis for treatment or other patient management decisions. A negative result may occur with  improper specimen collection/handling, submission of specimen other than nasopharyngeal swab, presence of viral mutation(s) within the areas targeted by this assay, and inadequate number of viral copies (<131 copies/mL). A negative result must be combined with clinical observations, patient history, and epidemiological information. The expected result is Negative. Fact Sheet for Patients:  PinkCheek.be Fact Sheet for Healthcare Providers:  GravelBags.it This test is not yet ap proved or cleared by the [place] FDA and  has been authorized for detection and/or diagnosis of SARS-CoV-2 by FDA under an Emergency Use Authorization (EUA). This EUA will remain  in effect (meaning this test can be used) for the duration of the COVID-19 declaration under Section 564(b)(1) of the Act, 21 U.S.C. section 360bbb-3(b)(1), unless the authorization is terminated or revoked sooner.    Influenza A by PCR NEGATIVE NEGATIVE Final   Influenza B by PCR NEGATIVE NEGATIVE Final    Comment: (NOTE) The Xpert Xpress SARS-CoV-2/FLU/RSV assay is intended as an aid in  the diagnosis of influenza from Nasopharyngeal swab specimens and  should not be used as a sole basis for treatment. Nasal washings and  aspirates are unacceptable for Xpert Xpress SARS-CoV-2/FLU/RSV  testing. Fact Sheet for Patients: PinkCheek.be Fact Sheet for Healthcare Providers: GravelBags.it This test is not yet approved or cleared by the [place] FDA and  has been authorized for detection and/or diagnosis of SARS-CoV-2 by  FDA under an Emergency Use Authorization (EUA). This EUA will remain  in effect (meaning this  test can be used) for the duration of the  Covid-19 declaration under Section 564(b)(1) of the Act, 21  U.S.C. section 360bbb-3(b)(1), unless the authorization is  terminated or revoked. Performed at [facility], [address], [place], [place] [address]      Radiology Studies: No results found.  Scheduled Meds: . calcium-vitamin D  2 tablet Oral Daily  . carvedilol  3.125 mg Oral BID WC  . chlorhexidine  15 mL Mouth Rinse BID  . Chlorhexidine Gluconate Cloth  6 each Topical Daily  . loratadine  10 mg Oral Daily  . magnesium hydroxide  30 mL Oral Daily  . mouth rinse  15 mL Mouth Rinse q12n4p  . nystatin   Topical TID  .  pantoprazole  40 mg Oral BID AC  . psyllium  1 packet Oral Daily  . sodium chloride flush  10-40 mL Intracatheter Q12H  . sodium chloride flush  10-40 mL Intracatheter Q12H   Continuous Infusions: . sodium chloride Stopped (03/03/19 1606)  .  ceFAZolin (ANCEF) IV 2 g (03/05/19 0555)  . dextrose 50 mL/hr at 02/27/19 1600     LOS: 13 days   Time spent: 40 minutes.   Thornell Mule, MD [facility] Pager [phone]  If 7PM-7AM, please contact night-coverage [email or web address]  This record has been created using Systems analyst. Errors have been sought and corrected,but may not always be located. Such creation errors do not reflect on the standard of care.

## 2019-03-06 DIAGNOSIS — R0602 Shortness of breath: Secondary | ICD-10-CM

## 2019-03-06 MED ORDER — CEFAZOLIN IV (FOR PTA / DISCHARGE USE ONLY): 2.0000 g | Freq: Three times a day (TID) | INTRAVENOUS | 0 refills | Status: AC

## 2019-03-06 MED ORDER — FLUCONAZOLE 150 MG PO TABS: 150.0000 mg | ORAL_TABLET | Freq: Every day | ORAL | 0 refills | Status: DC | PRN

## 2019-03-06 NOTE — Consult Note (Signed)
Pharmacy Antibiotic Note  Jasmine Morrow is a 74 y.o. female admitted on 02/20/2019 with sepsis of unknown source. Blood cultures with MSSA. Plan to evaluate surgical site (s/p right ankle fracture with ORIF). Pharmacy has been consulted for cefazolin dosing. ID following.    Plan: Day 15 of abx. Will continue with Cefazolin 2 g IV q8h. Repeat blood cultures remain negative. End date planned for 2/26.   Height: 5\' 8"  (172.7 cm) Weight: (!) 311 lb 3.2 oz (141.2 kg) IBW/kg (Calculated) : 63.9  Temp (24hrs), Avg:98.2 F (36.8 C), Min:97.9 F (36.6 C), Max:98.6 F (37 C)  Recent Labs  Lab 02/28/19 0703 03/01/19 0457  WBC 5.3 5.4  CREATININE 0.56 0.58    Estimated Creatinine Clearance: 95.1 mL/min (by C-G formula based on SCr of 0.58 mg/dL).    Allergies  Allergen Reactions  . Celecoxib Nausea Only  . Hydrocodone-Acetaminophen Nausea And Vomiting  . Meloxicam Nausea Only  . Morphine Sulfate Er Beads Itching  . Naproxen Swelling  . Rofecoxib Nausea Only  . Sulfa Antibiotics Other (See Comments)    Reaction: unknown  . Ciprofloxacin Rash  . Nickel Rash  . Tramadol-Acetaminophen Nausea Only and Rash    Antimicrobials this admission: Vancomycin 1/20 >> 1/21 Cefepime 1/20 >> 1/21 Cefazolin 1/21 >>   Microbiology results: 1/20 BCx: MSSA 1/20 UCx: insignificant growth 1/22 Bcx: pending 1/21 GI Panel: Ecoli   Thank you for allowing pharmacy to be a part of this patient's care.  Oswald Hillock, PharmD, BCPS 03/06/2019 10:27 AM

## 2019-03-06 NOTE — TOC Progression Note (Signed)
Transition of Care [facility]) - Progression Note    Patient Details  Name: [name] MRN: [id number] Date of Birth: April 15, 1945  Transition of Care [facility]) CM/SW Contact  Shelbie Ammons, RN Phone Number: 03/06/2019, 3:52 PM  Clinical Narrative:  RNCM informed by patient's son that husband had went to [facility] to check on facility. After a little amount of time son relayed that husband was indeed happy with facility and was on his way back to hospital. Placed call to [facility] to check on authorization, was routed through to speak to [place] who was able to provide authorization. Authorization starts today and goes through 2/5. Authorization number is [id number] and case manager for follow up is [email or web address]. Husband arrived to hospital around 3pm and after some discussion between he and his wife at bedside they both verbalized that they would accept bed offer for transport to [facility]. Placed call to [place] [facility] who reports they can take her but that it will need to be tomorrow morning as they have to order a bariatric mattress. RNCM informed patient, husband, MD and bedside nurse of plan.     Expected Discharge Plan: [place] Barriers to Discharge: Continued Medical Work up  Expected Discharge Plan and Services Expected Discharge Plan: [place]   Discharge Planning Services: CM Consult   Living arrangements for the past 2 months: [place] Expected Discharge Date: 03/05/19                                     Social Determinants of Health (SDOH) Interventions    Readmission Risk Interventions No flowsheet data found.

## 2019-03-06 NOTE — TOC Progression Note (Signed)
Transition of Care [facility]) - Progression Note    Patient Details  Name: WILHEMINA GARIEPY MRN: [id number] Date of Birth: March 09, 1945  Transition of Care [facility]) CM/SW Contact  Shelbie Ammons, RN Phone Number: 03/06/2019, 1:03 PM  Clinical Narrative:    RNCM placed call to both patient's husband and son and left VM, did learn from [facility] that they would not be able to offer a bed. Received return call from patient's husband at around [place]. Did discuss facility could not offer a bed and offers that had been made. Discussed that a decision needed to be made by sometime around lunch time of what facility they would prefer. Husband requested this CM call back to [facility], call made and received return call with report that at this time they still do not have a bed to offer. Throughout morning did message back and forth with son and answer any questions he had.     Expected Discharge Plan: [place] Barriers to Discharge: Continued Medical Work up  Expected Discharge Plan and Services Expected Discharge Plan: [place]   Discharge Planning Services: CM Consult   Living arrangements for the past 2 months: [place] Expected Discharge Date: 03/05/19                                     Social Determinants of Health (SDOH) Interventions    Readmission Risk Interventions No flowsheet data found.

## 2019-03-06 NOTE — Progress Notes (Signed)
Cast changed today so she will not have to transported Friday to office.  Skin is healing, no sign of infection. Plan outpatient ORIF next Thursday.

## 2019-03-06 NOTE — Progress Notes (Signed)
Physical Therapy Treatment Patient Details Name: Jasmine Morrow MRN: [id number] DOB: 09-12-1945 Today's Date: 03/06/2019    History of Present Illness Pt is 74 y.o. female with a known history of recent ankle fracture and ORIF 02/08/2019. Admitted from from [facility] for chest pain. Found to have hypotension and anemia secondary to melena. Admitted for septic/hemorrhagic shock initially requiring pressor support. She was then intubated due to worsening mental status and inability to protect airway.  Extubated on 02/24/19. Now off the pressors.  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  No more melena or hematemesis. Has had total of 6 units of PRBC and 1 unit of fresh frozen plasma.    PT Comments    Pt was supine in bed upon arriving with [facility] elevated ~ 30 degrees. She required minimal encouragement to participate but was willing and cooperative once motivated. Prior to sitting up EOB pt perform supine there ex to promote BLEs strengthening( see exercises performed below). She required max assist to roll R to short sit EOB with increased time and vcs throughout for technique and sequencing. Pt is anxious but with relaxation techniques, tolerates well. Pt sat EOB x ten minutes prior to returning to supine and being reposition to [facility] with RLE elevated, bell alarm set, and call bell in reach. Overall pt tolerated session well. Continued recommendation for SNF at d/c for safety. Unsafe to progress to standing 2/2 to LE weakness and wt bearing restriction on RLE. PT will continue to progress and follow pt per POC.   Follow Up Recommendations  SNF     Equipment Recommendations  None recommended by PT    Recommendations for Other Services       Precautions / Restrictions Precautions Precautions: Fall Precaution Comments: R ankle cast Required Braces or Orthoses: Splint/Cast Splint/Cast: R foot/ankle cast Restrictions Weight Bearing Restrictions: Yes RLE Weight  Bearing: Touchdown weight bearing    Mobility  Bed Mobility Overal bed mobility: Needs Assistance Bed Mobility: Supine to Sit;Rolling;Sit to Supine Rolling: Mod assist(rolled to R)   Supine to sit: Max assist(HOB flat) Sit to supine: Total assist   General bed mobility comments: Max assist of one to supine>roll R>to short sit with increased time and vcs throughout for technique, sequencing, and safety. Total assist to return to supine and reposition back to [place] transfer comment: unsafe to trial at this time  Ambulation/Gait                 Stairs             Wheelchair Mobility    Modified Rankin (Stroke Patients Only)       Balance Overall balance assessment: Needs assistance;History of Falls Sitting-balance support: Bilateral upper extremity supported;Feet supported Sitting balance-Leahy Scale: Fair Sitting balance - Comments: pt able to sit EOB x ten minutes with BLEs supported and BUE support. she required CGA for safety                                    Cognition Arousal/Alertness: Awake/alert Behavior During Therapy: WFL for tasks assessed/performed Overall Cognitive Status: Within Functional Limits for tasks assessed Area of Impairment: Attention  General Comments: Pt needs constant cueing to stay focus on desired task      Exercises General Exercises - Lower Extremity Ankle Circles/Pumps: Left;AROM;10 reps;Supine Quad Sets: AROM;Both;10 reps Gluteal Sets: AROM;10 reps;Supine Short Arc Quad: AAROM;Right;10 reps;AROM;Left;Supine Long Arc Quad: AROM;Both;10 reps;Seated(seated EOB) Heel Slides: AAROM;Both;10 reps;Supine Hip ABduction/ADduction: AAROM;Both;10 reps;Supine Straight Leg Raises: AAROM;Both;10 reps;Supine    General Comments        Pertinent Vitals/Pain Pain Assessment: 0-10 Pain Score: 4  Faces Pain Scale: Hurts a little  bit Pain Location: R LE with mobilization Pain Intervention(s): Limited activity within patient's tolerance    Home Living                      Prior Function            PT Goals (current goals can now be found in the care plan section) Acute Rehab PT Goals Patient Stated Goal: " I just got a new cast but I feel ok." Progress towards PT goals: Progressing toward goals    Frequency    7X/week      PT Plan Current plan remains appropriate    Co-evaluation              AM-PAC PT "6 Clicks" Mobility   Outcome Measure  Help needed turning from your back to your side while in a flat bed without using bedrails?: A Little Help needed moving from lying on your back to sitting on the side of a flat bed without using bedrails?: A Lot Help needed moving to and from a bed to a chair (including a wheelchair)?: A Lot Help needed standing up from a chair using your arms (e.g., wheelchair or bedside chair)?: Total Help needed to walk in hospital room?: Total Help needed climbing 3-5 steps with a railing? : Total 6 Click Score: 10    End of Session   Activity Tolerance: Patient tolerated treatment well;Patient limited by fatigue Patient left: in bed;with bed alarm set;with call bell/phone within reach Nurse Communication: Mobility status PT Visit Diagnosis: Muscle weakness (generalized) (M62.81);History of falling (Z91.81);Difficulty in walking, not elsewhere classified (R26.2);Pain Pain - Right/Left: Right Pain - part of body: Ankle and joints of foot     Time: [id number] PT Time Calculation (min) (ACUTE ONLY): 18 min  Charges:  $Therapeutic Activity: 8-22 mins                     Julaine Fusi PTA 03/06/19, 2:10 PM

## 2019-03-06 NOTE — Progress Notes (Signed)
PROGRESS NOTE  PCCM transfer  Jasmine Morrow  [id number] DOB: Apr 09, 1945 DOA: 02/20/2019 PCP: Derinda Late, MD   Brief Narrative:  Jasmine Morrow  is a 74 y.o. female with a known history of recent ankle fracture on 1/7 who comes from [place] for chest pain. Found to have hypotension and anemia secondary to melena. Admitted for septic/hemorrhagic shock initially requiring pressor support. She was then intubated due to worsening mental status and inability to protect airway.  Extubated on 02/24/19. Now off the pressors.  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  No more melena or hematemesis.  See if total of 6 units of PRBC and 1 unit of fresh frozen plasma.  Subjective: No acute issues.  Awaiting SNF discharge. Feels much better  Assessment & Plan:   Active Problems:   Symptomatic anemia   SOB (shortness of breath)   Gastrointestinal hemorrhage   Sepsis (HCC)   Pressure injury of skin  Septic/hemorrhagic shock.  Resolved. Most likely secondary to diverticular bleed. Patient underwent EGD and colonoscopy along with RBC tagged red cell scan (during this hospitalization) which was negative for any active bleeding. She received multiple blood transfusions, total of 7 PRBC and 1 unit of FFP. Hemoglobin was found to be around 7.4 without any obvious bleeding.--> Received another unit of pRBC. Repeat H/H on D/C remained above 8/26.  -Expected hemoglobin and hematocrit should remain within the stable range.  Do not anticipate frequent blood transfusion either.  Check hemoglobin and hematocrit per facility protocol only. transfuse if below 7. -Rectal tube was pulled. No nursing concern about melena or hematochezia at this time. Had a couple of BMs which was appeared to be normal. - GI was reconsulted on 02/28/19 due slight drop of H/H --> No new intervention is indicated as Pt's H/H remained stable   - Continue with PPI. - PT/OT rec to SNF.    -discharged for rehab tomorrow  MSSA bacteremia. Blood cultures growing MSSA, most likely her surgical site is the source. TTE and TEE both were negative for any endocarditis  Antibiotics were deescalated to cefazolin-will need 4 weeks of cefazolin.End date 03/29/2019. -PICC line were placed. -ID has been following; placed order for Abx OP/at the SNF and f/u labs already.   AKI.  Resolved now, most likely secondary to shock. -Avoid nephrotoxic. -Continue to monitor.  Hypokalemia.  Resolved  Right ankle fracture s/p recent ORIF -Ortho has seen the patient.  Currently no sign of infection at the surgical site. -Patient may follow-up outpatient  Patient wanted to take Diflucan as needed because she said every time she is on antibiotics she developed some fungal groin infection.  Used to take Diflucan as needed and Diflucan helped her.    Stool softener/bowel regimen as needed.  Objective: Vitals with BMI 03/06/2019 03/06/2019 03/06/2019  Height - - -  Weight - - -  BMI - - -  Systolic [id number]  Diastolic 66 65 78  Pulse [id number] 102 81    Last Weight  Most recent update: 03/02/2019  4:51 AM   Weight  141.2 kg (311 lb 3.2 oz)             Examination:  General exam: Appears very comfortable and says she is looking forward to be discharged Respiratory system: Clear to auscultation. Respiratory effort normal. Cardiovascular system: S1 & S2 heard, RRR. No JVD, murmurs, rubs, gallops or clicks. Gastrointestinal system: Soft, nontender, nondistended, bowel sounds positive. Central nervous system: Alert  and oriented. No focal neurological deficits.Symmetric 5 x 5 power. Extremities: Right ankle with Ace wrap, no edema, no cyanosis, pulses intact  Skin: No rashes, lesions or ulcers Psychiatry: Judgement and insight appear normal. Mood & affect appropriate.    DVT prophylaxis: SCDs Code Status: Full Family Communication: TOC working with family on dispo Disposition Plan:   discharged to SNF tomorrow per [facility] team  Consultants:   PCCM  ID  Orthopedic  Procedures:  Antimicrobials:  Cefazolin  Data Reviewed: I have personally reviewed following labs and imaging studies  CBC: Recent Labs  Lab 02/28/19 0703 02/28/19 1407 02/28/19 2017 03/01/19 0457  WBC 5.3  --   --  5.4  HGB 6.9* 7.4* 8.4* 8.2*  HCT 22.8* 24.6* 27.6* 26.2*  MCV 91.9  --   --  90.0  PLT 202  --   --  [id number]   Basic Metabolic Panel: Recent Labs  Lab 02/28/19 0703 03/01/19 0457  NA 139 137  K 3.4* 3.8  CL 111 109  CO2 23 23  GLUCOSE 95 95  BUN 17 12  CREATININE 0.56 0.58  CALCIUM 8.6* 8.5*  MG 1.5*  --    GFR: Estimated Creatinine Clearance: 95.1 mL/min (by C-G formula based on SCr of 0.58 mg/dL). Liver Function Tests: No results for input(s): AST, ALT, ALKPHOS, BILITOT, PROT, ALBUMIN in the last 168 hours. No results for input(s): LIPASE, AMYLASE in the last 168 hours. No results for input(s): AMMONIA in the last 168 hours. Coagulation Profile: No results for input(s): INR, PROTIME in the last 168 hours. Cardiac Enzymes: No results for input(s): CKTOTAL, CKMB, CKMBINDEX, TROPONINI in the last 168 hours. BNP (last 3 results) No results for input(s): PROBNP in the last 8760 hours. HbA1C: No results for input(s): HGBA1C in the last 72 hours. CBG: Recent Labs  Lab 03/02/19 1820  GLUCAP 106*   Lipid Profile: No results for input(s): CHOL, HDL, LDLCALC, TRIG, CHOLHDL, LDLDIRECT in the last 72 hours. Thyroid Function Tests: No results for input(s): TSH, T4TOTAL, FREET4, T3FREE, THYROIDAB in the last 72 hours. Anemia Panel: No results for input(s): VITAMINB12, FOLATE, FERRITIN, TIBC, IRON, RETICCTPCT in the last 72 hours. Sepsis Labs: No results for input(s): PROCALCITON, LATICACIDVEN in the last 168 hours.  Recent Results (from the past 240 hour(s))  MRSA PCR Screening     Status: None   Collection Time: 02/26/19  4:51 AM   Specimen: Nasal Mucosa;  Nasopharyngeal  Result Value Ref Range Status   MRSA by PCR NEGATIVE NEGATIVE Final    Comment:        The GeneXpert MRSA Assay (FDA approved for NASAL specimens only), is one component of a comprehensive MRSA colonization surveillance program. It is not intended to diagnose MRSA infection nor to guide or monitor treatment for MRSA infections. Performed at [facility], [address] [place], [place] [address]   Respiratory Panel by RT PCR (Flu A&B, Covid) - Nasopharyngeal Swab     Status: None   Collection Time: 03/01/19 12:59 PM   Specimen: Nasopharyngeal Swab  Result Value Ref Range Status   SARS Coronavirus 2 by RT PCR NEGATIVE NEGATIVE Final    Comment: (NOTE) SARS-CoV-2 target nucleic acids are NOT DETECTED. The SARS-CoV-2 RNA is generally detectable in upper respiratoy specimens during the acute phase of infection. The lowest concentration of SARS-CoV-2 viral copies this assay can detect is 131 copies/mL. A negative result does not preclude SARS-Cov-2 infection and should not be used as the sole basis  for treatment or other patient management decisions. A negative result may occur with  improper specimen collection/handling, submission of specimen other than nasopharyngeal swab, presence of viral mutation(s) within the areas targeted by this assay, and inadequate number of viral copies (<131 copies/mL). A negative result must be combined with clinical observations, patient history, and epidemiological information. The expected result is Negative. Fact Sheet for Patients:  PinkCheek.be Fact Sheet for Healthcare Providers:  GravelBags.it This test is not yet ap proved or cleared by the [place] FDA and  has been authorized for detection and/or diagnosis of SARS-CoV-2 by FDA under an Emergency Use Authorization (EUA). This EUA will remain  in effect (meaning this test can be used) for the duration  of the COVID-19 declaration under Section 564(b)(1) of the Act, 21 U.S.C. section 360bbb-3(b)(1), unless the authorization is terminated or revoked sooner.    Influenza A by PCR NEGATIVE NEGATIVE Final   Influenza B by PCR NEGATIVE NEGATIVE Final    Comment: (NOTE) The Xpert Xpress SARS-CoV-2/FLU/RSV assay is intended as an aid in  the diagnosis of influenza from Nasopharyngeal swab specimens and  should not be used as a sole basis for treatment. Nasal washings and  aspirates are unacceptable for Xpert Xpress SARS-CoV-2/FLU/RSV  testing. Fact Sheet for Patients: PinkCheek.be Fact Sheet for Healthcare Providers: GravelBags.it This test is not yet approved or cleared by the [place] FDA and  has been authorized for detection and/or diagnosis of SARS-CoV-2 by  FDA under an Emergency Use Authorization (EUA). This EUA will remain  in effect (meaning this test can be used) for the duration of the  Covid-19 declaration under Section 564(b)(1) of the Act, 21  U.S.C. section 360bbb-3(b)(1), unless the authorization is  terminated or revoked. Performed at [facility], [address] [place], [place] [address]   Respiratory Panel by RT PCR (Flu A&B, Covid) - Nasopharyngeal Swab     Status: None   Collection Time: 03/05/19 12:45 PM   Specimen: Nasopharyngeal Swab  Result Value Ref Range Status   SARS Coronavirus 2 by RT PCR NEGATIVE NEGATIVE Final    Comment: (NOTE) SARS-CoV-2 target nucleic acids are NOT DETECTED. The SARS-CoV-2 RNA is generally detectable in upper respiratoy specimens during the acute phase of infection. The lowest concentration of SARS-CoV-2 viral copies this assay can detect is 131 copies/mL. A negative result does not preclude SARS-Cov-2 infection and should not be used as the sole basis for treatment or other patient management decisions. A negative result may occur with  improper specimen  collection/handling, submission of specimen other than nasopharyngeal swab, presence of viral mutation(s) within the areas targeted by this assay, and inadequate number of viral copies (<131 copies/mL). A negative result must be combined with clinical observations, patient history, and epidemiological information. The expected result is Negative. Fact Sheet for Patients:  PinkCheek.be Fact Sheet for Healthcare Providers:  GravelBags.it This test is not yet ap proved or cleared by the [place] FDA and  has been authorized for detection and/or diagnosis of SARS-CoV-2 by FDA under an Emergency Use Authorization (EUA). This EUA will remain  in effect (meaning this test can be used) for the duration of the COVID-19 declaration under Section 564(b)(1) of the Act, 21 U.S.C. section 360bbb-3(b)(1), unless the authorization is terminated or revoked sooner.    Influenza A by PCR NEGATIVE NEGATIVE Final   Influenza B by PCR NEGATIVE NEGATIVE Final    Comment: (NOTE) The Xpert Xpress SARS-CoV-2/FLU/RSV assay is intended as an aid  in  the diagnosis of influenza from Nasopharyngeal swab specimens and  should not be used as a sole basis for treatment. Nasal washings and  aspirates are unacceptable for Xpert Xpress SARS-CoV-2/FLU/RSV  testing. Fact Sheet for Patients: PinkCheek.be Fact Sheet for Healthcare Providers: GravelBags.it This test is not yet approved or cleared by the [place] FDA and  has been authorized for detection and/or diagnosis of SARS-CoV-2 by  FDA under an Emergency Use Authorization (EUA). This EUA will remain  in effect (meaning this test can be used) for the duration of the  Covid-19 declaration under Section 564(b)(1) of the Act, 21  U.S.C. section 360bbb-3(b)(1), unless the authorization is  terminated or revoked. Performed at [facility],  [address] [place], [place] [address]      Radiology Studies: No results found.  Scheduled Meds: . calcium-vitamin D  2 tablet Oral Daily  . carvedilol  3.125 mg Oral BID WC  . chlorhexidine  15 mL Mouth Rinse BID  . Chlorhexidine Gluconate Cloth  6 each Topical Daily  . loratadine  10 mg Oral Daily  . magnesium hydroxide  30 mL Oral Daily  . mouth rinse  15 mL Mouth Rinse q12n4p  . nystatin   Topical TID  . pantoprazole  40 mg Oral BID AC  . psyllium  1 packet Oral Daily  . sodium chloride flush  10-40 mL Intracatheter Q12H  . sodium chloride flush  10-40 mL Intracatheter Q12H   Continuous Infusions: . sodium chloride Stopped (03/06/19 1510)  .  ceFAZolin (ANCEF) IV Stopped (03/06/19 1438)  . dextrose 50 mL/hr at 02/27/19 1600     LOS: 14 days   Time spent: 40 minutes.   Max Sane, MD [facility]  If 7PM-7AM, please contact night-coverage [email or web address]  This record has been created using Systems analyst. Errors have been sought and corrected,but may not always be located. Such creation errors do not reflect on the standard of care.

## 2019-03-07 NOTE — Progress Notes (Addendum)
Patient given discharge instructions. Report also given to [facility] at [facility]. Discharge packet ready. Patient transported to facility via EMS. Husband was at bedside and took all of her belongings with him. Patient left with PICC line for antibiotic tx. Tele monitor off.

## 2019-03-07 NOTE — Plan of Care (Signed)

## 2019-03-07 NOTE — TOC Transition Note (Signed)
Transition of Care [facility]) - CM/SW Discharge Note   Patient Details  Name: Jasmine Morrow MRN: [id number] Date of Birth: [birth year]  Transition of Care [facility]) CM/SW Contact:  Shelbie Ammons, RN Phone Number: 03/07/2019, 11:17 AM   Clinical Narrative:   RNCM placed call to [facility] to assure bed was ready for patient. Patient notified at bedside and secure chat sent to MD and bedside nurse. After assurance of bed ready placed call to EMS and arrange transport. EMS packet delivered to floor and phone number given to nurse to call report.     Final next level of care: Skilled Nursing Facility Barriers to Discharge: Barriers Resolved   Patient Goals and CMS Choice        Discharge Placement              Patient chooses bed at: Other - please specify in the comment section below:([facility]) Patient to be transferred to facility by: [facility] Name of family member notified: patient at bedside    Discharge Plan and Services   Discharge Planning Services: CM Consult                                 Social Determinants of Health ([place]) Interventions     Readmission Risk Interventions No flowsheet data found.

## 2019-03-11 ENCOUNTER — Other Ambulatory Visit
Admit: 2019-03-11 | Discharge: 2019-03-11 | Disposition: A | Discharge status: Home / Self Care | Payer: Medicare Other | Attending: Orthopedic Surgery | Admitting: Orthopedic Surgery

## 2019-03-11 ENCOUNTER — Other Ambulatory Visit: Payer: Self-pay | Admitting: Orthopedic Surgery

## 2019-03-11 HISTORY — DX: Unspecified asthma, uncomplicated: J45.909

## 2019-03-11 HISTORY — DX: Gastro-esophageal reflux disease without esophagitis: K21.9

## 2019-03-11 NOTE — Pre-Procedure Instructions (Addendum)
Kacie from Dr. Rudene Christians office called the facility where the patient currently resides to notify them of the arrival time for day of surgery. I called and talked with the patient's nurse and notified of the medications to take morning of surgery: Albuterol, Coreg, Claritin, Protonix, Ultram if needed. NPO after midnight except clear liquids 2 hours prior to arrival time.

## 2019-03-12 ENCOUNTER — Other Ambulatory Visit: Admit: 2019-03-12 | Payer: Medicare Other

## 2019-03-13 NOTE — Progress Notes (Signed)
This RN spoke with Caryl Pina at the nursing facility where this patient resides.  Reiterated medications for morning of surgery.  Negative Covid result from facility 03/11/19 is being faxed.  Per management (sds) , patient husband is allowed to accompany her to the preoperative area. Transportation was alerted to patient arrival time.

## 2019-03-14 ENCOUNTER — Ambulatory Visit: Payer: Medicare Other

## 2019-03-14 ENCOUNTER — Ambulatory Visit: Payer: Medicare Other | Admitting: Registered Nurse

## 2019-03-14 ENCOUNTER — Ambulatory Visit
Admission: RE | Admit: 2019-03-14 | Discharge: 2019-03-14 | Disposition: A | Discharge status: Home / Self Care | Payer: Medicare Other | Source: Ambulatory Visit | Attending: Orthopedic Surgery | Admitting: Orthopedic Surgery

## 2019-03-14 ENCOUNTER — Inpatient Hospital Stay: Admission: RE | Admit: 2019-03-14 | Payer: Medicare Other | Source: Ambulatory Visit

## 2019-03-14 ENCOUNTER — Encounter
Admission: RE | Disposition: A | Discharge status: Home / Self Care | Payer: Self-pay | Source: Ambulatory Visit | Attending: Orthopedic Surgery

## 2019-03-14 ENCOUNTER — Other Ambulatory Visit: Payer: Self-pay

## 2019-03-14 ENCOUNTER — Encounter: Payer: Self-pay | Admitting: Orthopedic Surgery

## 2019-03-14 DIAGNOSIS — I10 Essential (primary) hypertension: Secondary | ICD-10-CM | POA: Insufficient documentation

## 2019-03-14 DIAGNOSIS — Z882 Allergy status to sulfonamides status: Secondary | ICD-10-CM | POA: Diagnosis not present

## 2019-03-14 DIAGNOSIS — Z808 Family history of malignant neoplasm of other organs or systems: Secondary | ICD-10-CM | POA: Diagnosis not present

## 2019-03-14 DIAGNOSIS — X58XXXA Exposure to other specified factors, initial encounter: Secondary | ICD-10-CM | POA: Diagnosis not present

## 2019-03-14 DIAGNOSIS — Z825 Family history of asthma and other chronic lower respiratory diseases: Secondary | ICD-10-CM | POA: Diagnosis not present

## 2019-03-14 DIAGNOSIS — Z79899 Other long term (current) drug therapy: Secondary | ICD-10-CM | POA: Insufficient documentation

## 2019-03-14 DIAGNOSIS — D649 Anemia, unspecified: Secondary | ICD-10-CM | POA: Diagnosis not present

## 2019-03-14 DIAGNOSIS — Z96653 Presence of artificial knee joint, bilateral: Secondary | ICD-10-CM | POA: Insufficient documentation

## 2019-03-14 DIAGNOSIS — R0602 Shortness of breath: Secondary | ICD-10-CM | POA: Insufficient documentation

## 2019-03-14 DIAGNOSIS — S82851A Displaced trimalleolar fracture of right lower leg, initial encounter for closed fracture: Secondary | ICD-10-CM | POA: Insufficient documentation

## 2019-03-14 DIAGNOSIS — T148XXA Other injury of unspecified body region, initial encounter: Secondary | ICD-10-CM

## 2019-03-14 DIAGNOSIS — Z888 Allergy status to other drugs, medicaments and biological substances status: Secondary | ICD-10-CM | POA: Diagnosis not present

## 2019-03-14 DIAGNOSIS — Z9109 Other allergy status, other than to drugs and biological substances: Secondary | ICD-10-CM | POA: Diagnosis not present

## 2019-03-14 DIAGNOSIS — Z886 Allergy status to analgesic agent status: Secondary | ICD-10-CM | POA: Diagnosis not present

## 2019-03-14 DIAGNOSIS — Z6841 Body Mass Index (BMI) 40.0 and over, adult: Secondary | ICD-10-CM | POA: Diagnosis not present

## 2019-03-14 DIAGNOSIS — G4733 Obstructive sleep apnea (adult) (pediatric): Secondary | ICD-10-CM | POA: Diagnosis not present

## 2019-03-14 DIAGNOSIS — J45909 Unspecified asthma, uncomplicated: Secondary | ICD-10-CM | POA: Diagnosis not present

## 2019-03-14 DIAGNOSIS — K219 Gastro-esophageal reflux disease without esophagitis: Secondary | ICD-10-CM | POA: Diagnosis not present

## 2019-03-14 DIAGNOSIS — Z87891 Personal history of nicotine dependence: Secondary | ICD-10-CM | POA: Diagnosis not present

## 2019-03-14 DIAGNOSIS — Z885 Allergy status to narcotic agent status: Secondary | ICD-10-CM | POA: Insufficient documentation

## 2019-03-14 DIAGNOSIS — R918 Other nonspecific abnormal finding of lung field: Secondary | ICD-10-CM | POA: Insufficient documentation

## 2019-03-14 DIAGNOSIS — K922 Gastrointestinal hemorrhage, unspecified: Secondary | ICD-10-CM | POA: Diagnosis not present

## 2019-03-14 DIAGNOSIS — Z881 Allergy status to other antibiotic agents status: Secondary | ICD-10-CM | POA: Diagnosis not present

## 2019-03-14 HISTORY — DX: Sleep apnea, unspecified: G47.30

## 2019-03-14 HISTORY — PX: ORIF ANKLE FRACTURE: SHX5408

## 2019-03-14 SURGERY — OPEN REDUCTION INTERNAL FIXATION (ORIF) ANKLE FRACTURE
Anesthesia: General | Site: Ankle | Laterality: Right

## 2019-03-14 MED ORDER — IPRATROPIUM-ALBUTEROL 0.5-2.5 (3) MG/3ML IN SOLN
3.0000 mL | Freq: Once | RESPIRATORY_TRACT | Status: AC
  Administered 2019-03-14: 13:00:00 3 mL via RESPIRATORY_TRACT

## 2019-03-14 MED ORDER — ROCURONIUM BROMIDE 50 MG/5ML IV SOLN
INTRAVENOUS | Status: AC
  Filled 2019-03-14: qty 1

## 2019-03-14 MED ORDER — PROPOFOL 10 MG/ML IV BOLUS
INTRAVENOUS | Status: DC | PRN
  Administered 2019-03-14: 200 mg via INTRAVENOUS

## 2019-03-14 MED ORDER — DEXAMETHASONE SODIUM PHOSPHATE 10 MG/ML IJ SOLN
INTRAMUSCULAR | Status: AC
  Filled 2019-03-14: qty 1

## 2019-03-14 MED ORDER — SODIUM CHLORIDE (PF) 0.9 % IJ SOLN
INTRAMUSCULAR | Status: AC
  Filled 2019-03-14: qty 10

## 2019-03-14 MED ORDER — CEFAZOLIN SODIUM-DEXTROSE 2-4 GM/100ML-% IV SOLN: 2.0000 g | INTRAVENOUS | Status: DC

## 2019-03-14 MED ORDER — FENTANYL CITRATE (PF) 100 MCG/2ML IJ SOLN
25.0000 ug | INTRAMUSCULAR | Status: DC | PRN
  Administered 2019-03-14 (×3): 25 ug via INTRAVENOUS

## 2019-03-14 MED ORDER — ACETAMINOPHEN 500 MG PO TABS
ORAL_TABLET | ORAL | Status: AC
  Administered 2019-03-14: 14:00:00 1000 mg via ORAL
  Filled 2019-03-14: qty 2

## 2019-03-14 MED ORDER — PHENYLEPHRINE HCL-NACL 10-0.9 MG/250ML-% IV SOLN
INTRAVENOUS | Status: DC | PRN
  Administered 2019-03-14: 25 ug/min via INTRAVENOUS

## 2019-03-14 MED ORDER — NEOMYCIN-POLYMYXIN B GU 40-200000 IR SOLN
Status: DC | PRN
  Administered 2019-03-14: 4 mL

## 2019-03-14 MED ORDER — FENTANYL CITRATE (PF) 100 MCG/2ML IJ SOLN
INTRAMUSCULAR | Status: AC
  Filled 2019-03-14: qty 2

## 2019-03-14 MED ORDER — SUGAMMADEX SODIUM 500 MG/5ML IV SOLN
INTRAVENOUS | Status: DC | PRN
  Administered 2019-03-14: 300 mg via INTRAVENOUS

## 2019-03-14 MED ORDER — ROCURONIUM BROMIDE 100 MG/10ML IV SOLN
INTRAVENOUS | Status: DC | PRN
  Administered 2019-03-14: 20 mg via INTRAVENOUS
  Administered 2019-03-14: 50 mg via INTRAVENOUS

## 2019-03-14 MED ORDER — ACETAMINOPHEN 500 MG PO TABS: 1000.0000 mg | ORAL_TABLET | Freq: Once | ORAL | Status: AC

## 2019-03-14 MED ORDER — IPRATROPIUM-ALBUTEROL 0.5-2.5 (3) MG/3ML IN SOLN
RESPIRATORY_TRACT | Status: AC
  Filled 2019-03-14: qty 3

## 2019-03-14 MED ORDER — FENTANYL CITRATE (PF) 100 MCG/2ML IJ SOLN
INTRAMUSCULAR | Status: AC
  Administered 2019-03-14: 25 ug via INTRAVENOUS
  Filled 2019-03-14: qty 2

## 2019-03-14 MED ORDER — ONDANSETRON HCL 4 MG/2ML IJ SOLN
INTRAMUSCULAR | Status: DC | PRN
  Administered 2019-03-14: 4 mg via INTRAVENOUS

## 2019-03-14 MED ORDER — CEFAZOLIN SODIUM-DEXTROSE 2-4 GM/100ML-% IV SOLN
INTRAVENOUS | Status: AC
  Filled 2019-03-14: qty 100

## 2019-03-14 MED ORDER — FAMOTIDINE 20 MG PO TABS
ORAL_TABLET | ORAL | Status: AC
  Administered 2019-03-14: 09:00:00 20 mg via ORAL
  Filled 2019-03-14: qty 1

## 2019-03-14 MED ORDER — LACTATED RINGERS IV SOLN: INTRAVENOUS | Status: DC | PRN

## 2019-03-14 MED ORDER — KETAMINE HCL 10 MG/ML IJ SOLN
INTRAMUSCULAR | Status: DC | PRN
  Administered 2019-03-14: 20 mg via INTRAVENOUS
  Administered 2019-03-14: 30 mg via INTRAVENOUS

## 2019-03-14 MED ORDER — CALCIUM CARBONATE ANTACID 500 MG PO CHEW
1.0000 | CHEWABLE_TABLET | Freq: Once | ORAL | Status: AC
  Administered 2019-03-14: 200 mg via ORAL
  Filled 2019-03-14: qty 1

## 2019-03-14 MED ORDER — LACTATED RINGERS IV SOLN: INTRAVENOUS | Status: DC

## 2019-03-14 MED ORDER — ONDANSETRON HCL 4 MG/2ML IJ SOLN: 4.0000 mg | Freq: Once | INTRAMUSCULAR | Status: DC | PRN

## 2019-03-14 MED ORDER — LIDOCAINE HCL (PF) 2 % IJ SOLN
INTRAMUSCULAR | Status: AC
  Filled 2019-03-14: qty 10

## 2019-03-14 MED ORDER — ONDANSETRON HCL 4 MG/2ML IJ SOLN
INTRAMUSCULAR | Status: AC
  Filled 2019-03-14: qty 2

## 2019-03-14 MED ORDER — DEXAMETHASONE SODIUM PHOSPHATE 10 MG/ML IJ SOLN
INTRAMUSCULAR | Status: DC | PRN
  Administered 2019-03-14: 10 mg via INTRAVENOUS

## 2019-03-14 MED ORDER — LIDOCAINE HCL (CARDIAC) PF 100 MG/5ML IV SOSY
PREFILLED_SYRINGE | INTRAVENOUS | Status: DC | PRN
  Administered 2019-03-14: 100 mg via INTRAVENOUS

## 2019-03-14 MED ORDER — PHENYLEPHRINE HCL (PRESSORS) 10 MG/ML IV SOLN
INTRAVENOUS | Status: DC | PRN
  Administered 2019-03-14: 100 ug via INTRAVENOUS
  Administered 2019-03-14: 150 ug via INTRAVENOUS
  Administered 2019-03-14: 200 ug via INTRAVENOUS

## 2019-03-14 MED ORDER — FAMOTIDINE 20 MG PO TABS: 20.0000 mg | ORAL_TABLET | Freq: Once | ORAL | Status: AC

## 2019-03-14 MED ORDER — CHLORHEXIDINE GLUCONATE 4 % EX LIQD
60.0000 mL | Freq: Once | CUTANEOUS | Status: AC
  Administered 2019-03-14: 4 via TOPICAL

## 2019-03-14 MED ORDER — FENTANYL CITRATE (PF) 100 MCG/2ML IJ SOLN
INTRAMUSCULAR | Status: DC | PRN
  Administered 2019-03-14: 25 ug via INTRAVENOUS
  Administered 2019-03-14: 50 ug via INTRAVENOUS
  Administered 2019-03-14: 25 ug via INTRAVENOUS

## 2019-03-14 SURGICAL SUPPLY — 51 items
BIT DRILL 2.9 CANN QC NONSTRL (BIT) ×1 IMPLANT
BNDG ELASTIC 4X5.8 VLCR STR LF (GAUZE/BANDAGES/DRESSINGS) ×4 IMPLANT
CANISTER SUCT 1200ML W/VALVE (MISCELLANEOUS) ×2 IMPLANT
CASTING MATERIAL DELTA LITE (CAST SUPPLIES) ×3 IMPLANT
CHLORAPREP W/TINT 26 (MISCELLANEOUS) ×2 IMPLANT
COVER WAND RF STERILE (DRAPES) ×2 IMPLANT
CUFF TOURN SGL QUICK 30 (TOURNIQUET CUFF) ×1
CUFF TRNQT CYL 30X4X21-28X (TOURNIQUET CUFF) IMPLANT
DRAPE FLUOR MINI C-ARM 54X84 (DRAPES) ×2 IMPLANT
DRAPE INCISE IOBAN 66X45 STRL (DRAPES) ×2 IMPLANT
DRAPE U-SHAPE 47X51 STRL (DRAPES) ×2 IMPLANT
DRSG EMULSION OIL 3X8 NADH (GAUZE/BANDAGES/DRESSINGS) ×2 IMPLANT
ELECT CAUTERY BLADE 6.4 (BLADE) ×2 IMPLANT
ELECT REM PT RETURN 9FT ADLT (ELECTROSURGICAL) ×2
ELECTRODE REM PT RTRN 9FT ADLT (ELECTROSURGICAL) ×1 IMPLANT
GAUZE SPONGE 4X4 12PLY STRL (GAUZE/BANDAGES/DRESSINGS) ×2 IMPLANT
GAUZE XEROFORM 1X8 LF (GAUZE/BANDAGES/DRESSINGS) ×2 IMPLANT
GAUZE XEROFORM 5X9 LF (GAUZE/BANDAGES/DRESSINGS) ×1 IMPLANT
GLOVE SURG SYN 9.0  PF PI (GLOVE) ×1
GLOVE SURG SYN 9.0 PF PI (GLOVE) ×1 IMPLANT
GOWN SRG 2XL LVL 4 RGLN SLV (GOWNS) ×1 IMPLANT
GOWN STRL NON-REIN 2XL LVL4 (GOWNS) ×1
GOWN STRL REUS W/ TWL LRG LVL3 (GOWN DISPOSABLE) ×1 IMPLANT
GOWN STRL REUS W/TWL LRG LVL3 (GOWN DISPOSABLE) ×1
HEMOVAC 400ML (MISCELLANEOUS)
K-WIRE ACE 1.6X6 (WIRE) ×4
KIT DRAIN HEMOVAC JP 7FR 400ML (MISCELLANEOUS) IMPLANT
KIT TURNOVER KIT A (KITS) ×2 IMPLANT
KWIRE ACE 1.6X6 (WIRE) IMPLANT
LABEL OR SOLS (LABEL) ×2 IMPLANT
NS IRRIG 1000ML POUR BTL (IV SOLUTION) ×2 IMPLANT
PACK EXTREMITY ARMC (MISCELLANEOUS) ×2 IMPLANT
PAD ABD DERMACEA PRESS 5X9 (GAUZE/BANDAGES/DRESSINGS) ×3 IMPLANT
PAD CAST CTTN 4X4 STRL (SOFTGOODS) ×2 IMPLANT
PAD PREP 24X41 OB/GYN DISP (PERSONAL CARE ITEMS) ×2 IMPLANT
PADDING CAST COTTON 4X4 STRL (SOFTGOODS) ×1
SCALPEL PROTECTED #15 DISP (BLADE) ×4 IMPLANT
SCREW ACE CAN 4.0 34M (Screw) ×1 IMPLANT
SPLINT CAST 1 STEP 5X30 WHT (MISCELLANEOUS) ×2 IMPLANT
SPONGE LAP 18X18 RF (DISPOSABLE) ×2 IMPLANT
STAPLER SKIN PROX 35W (STAPLE) ×2 IMPLANT
SUT ETHILON 3-0 FS-10 30 BLK (SUTURE) ×2
SUT MNCRL AB 4-0 PS2 18 (SUTURE) ×4 IMPLANT
SUT VIC AB 0 CT1 36 (SUTURE) ×2 IMPLANT
SUT VIC AB 2-0 SH 27 (SUTURE) ×2
SUT VIC AB 2-0 SH 27XBRD (SUTURE) ×2 IMPLANT
SUT VIC AB 3-0 SH 27 (SUTURE) ×1
SUT VIC AB 3-0 SH 27X BRD (SUTURE) ×1 IMPLANT
SUT VIC AB 4-0 FS2 27 (SUTURE) ×1 IMPLANT
SUTURE EHLN 3-0 FS-10 30 BLK (SUTURE) ×1 IMPLANT
SYR 10ML LL (SYRINGE) ×2 IMPLANT

## 2019-03-14 NOTE — Discharge Instructions (Addendum)
AMBULATORY SURGERY  DISCHARGE INSTRUCTIONS   1) The drugs that you were given will stay in your system until tomorrow so for the next 24 hours you should not:  A) Drive an automobile B) Make any legal decisions C) Drink any alcoholic beverage   2) You may resume regular meals tomorrow.  Today it is better to start with liquids and gradually work up to solid foods.  You may eat anything you prefer, but it is better to start with liquids, then soup and crackers, and gradually work up to solid foods.   3) Please notify your doctor immediately if you have any unusual bleeding, trouble breathing, redness and pain at the surgery site, drainage, fever, or pain not relieved by medication.    4) Additional Instructions:        Please contact your physician with any problems or Same Day Surgery at [phone], Monday through Friday 6 am to 4 pm, or [place] at [facility] number at [phone].Keep leg elevated is much as possible. Continue with current medications.  Continue with current weightbearing status.  Toe-touch weightbearing.

## 2019-03-14 NOTE — Progress Notes (Signed)
Feeling better.

## 2019-03-14 NOTE — Anesthesia Procedure Notes (Signed)
Procedure Name: Intubation Date/Time: 03/14/2019 10:33 AM Performed by: Lia Foyer, CRNA Pre-anesthesia Checklist: Patient identified, Emergency Drugs available, Suction available and Patient being monitored Patient Re-evaluated:Patient Re-evaluated prior to induction Oxygen Delivery Method: Circle system utilized Preoxygenation: Pre-oxygenation with 100% oxygen Induction Type: IV induction Ventilation: Mask ventilation without difficulty Laryngoscope Size: McGraph and 3 Grade View: Grade I Tube type: Oral Tube size: 7.5 mm Number of attempts: 1 Airway Equipment and Method: Stylet,  Oral airway,  Video-laryngoscopy and Patient positioned with wedge pillow Placement Confirmation: ETT inserted through vocal cords under direct vision,  positive ETCO2 and breath sounds checked- equal and bilateral Secured at: 22 cm Tube secured with: Tape Dental Injury: Teeth and Oropharynx as per pre-operative assessment

## 2019-03-14 NOTE — Progress Notes (Signed)
EKG done    Pt has chest heaviness  Mid chest area

## 2019-03-14 NOTE — OR Nursing (Signed)
Pt will be taken back via [facility] transport. PT spouse , Yvone Neu [phone]

## 2019-03-14 NOTE — Transfer of Care (Signed)
Immediate Anesthesia Transfer of Care Note  Patient: Jasmine Morrow  Procedure(s) Performed: OPEN REDUCTION INTERNAL FIXATION (ORIF) ANKLE FRACTURE, MEDIAL MALLEOLUS (Right Ankle)  Patient Location: PACU  Anesthesia Type:General  Level of Consciousness: awake  Airway & Oxygen Therapy: Patient Spontanous Breathing and Patient connected to face mask oxygen  Post-op Assessment: Report given to RN and Post -op Vital signs reviewed and stable  Post vital signs: Reviewed  Last Vitals:  Vitals Value Taken Time  BP 122/65 03/14/19 1139  Temp    Pulse 74 03/14/19 1141  Resp 17 03/14/19 1141  SpO2 100 % 03/14/19 1141  Vitals shown include unvalidated device data.  Last Pain:  Vitals:   03/14/19 0821  TempSrc: Tympanic  PainSc:          Complications: No apparent anesthesia complications

## 2019-03-14 NOTE — Progress Notes (Signed)
Raised pt up  States she needed to breath deeper   Lungs decreased throughout

## 2019-03-14 NOTE — Progress Notes (Signed)
Pt states  Feels like she needs a deep breath    Duo neb given  Lungs sounds diminished

## 2019-03-14 NOTE — Progress Notes (Signed)
Dr Rosey Bath in to check pt  Ordered tums

## 2019-03-14 NOTE — Anesthesia Preprocedure Evaluation (Signed)
Anesthesia Evaluation  Patient identified by MRN, date of birth, ID band Patient awake    Reviewed: Allergy & Precautions, NPO status , Patient's Chart, lab work & pertinent test results  History of Anesthesia Complications Negative for: history of anesthetic complications  Airway Mallampati: III       Dental  (+) Dental Advidsory Given, Teeth Intact, Caps   Pulmonary neg shortness of breath, asthma , sleep apnea and Continuous Positive Airway Pressure Ventilation , neg recent URI, former smoker,    Pulmonary exam normal        Cardiovascular hypertension, (-) angina(-) Past MI and (-) Cardiac Stents Normal cardiovascular exam(-) dysrhythmias (-) Valvular Problems/Murmurs     Neuro/Psych negative neurological ROS  negative psych ROS   GI/Hepatic Neg liver ROS, GERD  ,  Endo/Other  neg diabetesMorbid obesity  Renal/GU negative Renal ROS  negative genitourinary   Musculoskeletal   Abdominal Normal abdominal exam  (+)   Peds negative pediatric ROS (+)  Hematology negative hematology ROS (+)   Anesthesia Other Findings Past Medical History: No date: Hypertension No date: Seasonal allergies  Reproductive/Obstetrics negative OB ROS                             Anesthesia Physical  Anesthesia Plan  ASA: III  Anesthesia Plan: General   Post-op Pain Management:    Induction: Intravenous  PONV Risk Score and Plan: 3 and Ondansetron, Dexamethasone and Treatment may vary due to age or medical condition  Airway Management Planned: Oral ETT  Additional Equipment:   Intra-op Plan:   Post-operative Plan: Extubation in OR  Informed Consent: I have reviewed the patients History and Physical, chart, labs and discussed the procedure including the risks, benefits and alternatives for the proposed anesthesia with the patient or authorized representative who has indicated his/her understanding and  acceptance.     Dental advisory given  Plan Discussed with: CRNA and Surgeon  Anesthesia Plan Comments:         Anesthesia Quick Evaluation

## 2019-03-14 NOTE — Progress Notes (Signed)
Belching   Crackers and water given

## 2019-03-14 NOTE — H&P (Signed)
Subjective:   Patient is a 74 y.o. female presents with prior ankle fracture dislocation, had medial skin blisters preventing prior repair of medial side. SKin should be healed to allow repair today..   Patient Active Problem List   Diagnosis Date Noted  . Pressure injury of skin 02/28/2019  . SOB (shortness of breath)   . Gastrointestinal hemorrhage   . Sepsis ([place])   . Symptomatic anemia 02/20/2019  . Trimalleolar fracture of ankle, closed, right, initial encounter 02/07/2019  . Multiple lung nodules 03/31/2014  . 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 OSA on CPAP 03/27/2014   Past Medical History:  Diagnosis Date  . Asthma   . GERD (gastroesophageal reflux disease)   . Hypertension   . Seasonal allergies   . Sleep apnea     Past Surgical History:  Procedure Laterality Date  . CESAREAN SECTION    . DILATION AND CURETTAGE OF UTERUS    . ESOPHAGOGASTRODUODENOSCOPY N/A 02/22/2019   Procedure: ESOPHAGOGASTRODUODENOSCOPY (EGD);  Surgeon: Toledo, Benay Pike, MD;  Location: [facility] ENDOSCOPY;  Service: Gastroenterology;  Laterality: N/A;  . FLEXIBLE SIGMOIDOSCOPY N/A 02/22/2019   Procedure: FLEXIBLE SIGMOIDOSCOPY;  Surgeon: Toledo, Benay Pike, MD;  Location: [facility] ENDOSCOPY;  Service: Gastroenterology;  Laterality: N/A;  . ORIF ANKLE FRACTURE Right 02/08/2019   Procedure: OPEN REDUCTION INTERNAL FIXATION (ORIF) ANKLE FRACTURE, post malleolus;  Surgeon: Hessie Knows, MD;  Location: [facility] ORS;  Service: Orthopedics;  Laterality: Right;  . REPLACEMENT TOTAL KNEE Bilateral 2008   2009  . SYNDESMOSIS REPAIR Right 02/08/2019   Procedure: SYNDESMOSIS REPAIR;  Surgeon: Hessie Knows, MD;  Location: [facility] ORS;  Service: Orthopedics;  Laterality: Right;  . TEE WITHOUT CARDIOVERSION N/A 02/26/2019   Procedure: TRANSESOPHAGEAL ECHOCARDIOGRAM (TEE);  Surgeon: Corey Skains, MD;  Location: [facility] ORS;  Service: Cardiovascular;  Laterality: N/A;  . TUMOR REMOVAL      Medications Prior to Admission  Medication  Sig Dispense Refill Last Dose  . acetaminophen (TYLENOL) 500 MG tablet Take 1,000 mg by mouth every 6 (six) hours as needed for mild pain or headache.    Past Week at Unknown time  . albuterol (PROVENTIL HFA;VENTOLIN HFA) 108 (90 Base) MCG/ACT inhaler Inhale 2 puffs into the lungs every 4 (four) hours as needed. (Patient taking differently: Inhale 2 puffs into the lungs every 4 (four) hours as needed for wheezing or shortness of breath. ) 1 Inhaler 3 03/14/2019 at Unknown time  . bisacodyl (DULCOLAX) 10 MG suppository Place 1 suppository (10 mg total) rectally daily as needed for moderate constipation. 12 suppository 0 Past Week at Unknown time  . carvedilol (COREG) 3.125 MG tablet Take 3.125 mg by mouth 2 (two) times daily.   03/14/2019 at Unknown time  . cyclobenzaprine (FLEXERIL) 10 MG tablet Take 10 mg by mouth daily as needed for muscle spasms.   Past Week at Unknown time  . fluocinonide cream (LIDEX) [id number] % Apply 1 application topically 3 (three) times daily as needed (mouth sores). Apply to mouth lesions as needed   Past Week at Unknown time  . hydroxypropyl methylcellulose / hypromellose (ISOPTO TEARS / GONIOVISC) 2.5 % ophthalmic solution Place 1 drop into both eyes every 8 (eight) hours as needed for dry eyes.    Past Week at Unknown time  . loratadine (CLARITIN) 10 MG tablet Take 1 tablet (10 mg total) by mouth daily.   Past Week at Unknown time  . mouth rinse LIQD solution 15 mLs by Mouth Rinse route 2 times daily at 12 noon  and 4 pm.  0 Past Week at Unknown time  . Multiple Vitamin (MULTIVITAMIN WITH MINERALS) TABS tablet Take 1 tablet by mouth daily.   Past Week at Unknown time  . Multiple Vitamins-Minerals (PRESERVISION AREDS 2 PO) Take 1 tablet by mouth 2 (two) times daily.   Past Week at Unknown time  . ondansetron (ZOFRAN) 8 MG tablet Take 1 tablet (8 mg total) by mouth every 8 (eight) hours as needed for nausea or vomiting. 20 tablet 0 Past Week at Unknown time  . pantoprazole  (PROTONIX) 40 MG tablet Take 1 tablet (40 mg total) by mouth 2 (two) times daily before a meal.   Past Week at Unknown time  . Calcium-Vitamin D 600-200 MG-UNIT per tablet Take 2 tablets by mouth daily.    03/08/2019  . ceFAZolin (ANCEF) IVPB Inject 2 g into the vein every 8 (eight) hours for 23 days. Indication:  MSSA bacteremia Last Day of Therapy:  03/29/2019 Labs - Once weekly:  CBC/D and CMP 69 Units 0 03/07/2019  . fluconazole (DIFLUCAN) 150 MG tablet Take 1 tablet (150 mg total) by mouth daily as needed (PRN for genital fungus while on Abx (Has a Hx of and taking it as PRN)). 3 tablet 0 03/07/2019  . traMADol (ULTRAM) 50 MG tablet Take 1-2 tablets (50-100 mg total) by mouth every 6 (six) hours as needed for severe pain. 40 tablet 0    Allergies  Allergen Reactions  . Celecoxib Nausea Only  . Hydrocodone-Acetaminophen Nausea And Vomiting  . Meloxicam Nausea Only  . Morphine Sulfate Er Beads Itching  . Naproxen Swelling  . Rofecoxib Nausea Only  . Sulfa Antibiotics Other (See Comments)    Reaction: unknown  . Ciprofloxacin Rash  . Nickel Rash  . Tramadol-Acetaminophen Nausea Only and Rash    Social History   Tobacco Use  . Smoking status: Former Smoker    Packs/day: 2.00    Years: 30.00    Pack years: 60.00    Types: Cigarettes    Quit date: 10/02/1990    Years since quitting: 28.4  . Smokeless tobacco: Never Used  Substance Use Topics  . Alcohol use: No    Alcohol/week: 0.0 standard drinks    Family History  Problem Relation Age of Onset  . Bone cancer Mother   . COPD Father     Review of Systems Pertinent items are noted in HPI.  Objective:   Patient Vitals for the past 8 hrs:  BP Temp Temp src Pulse Resp SpO2  03/14/19 0821 -- (!) 97.2 F (36.2 C) Tympanic -- -- --  03/14/19 0820 128/87 -- -- (!) 101 18 97 %   No intake/output data recorded. No intake/output data recorded.    BP 128/87   Pulse (!) 101   Temp (!) 97.2 F (36.2 C) (Tympanic)   Resp 18    SpO2 97%  General appearance: alert, cooperative and appears stated age Lungs: clear to auscultation bilaterally Heart: regular rate and rhythm, S1, S2 normal, no murmur, click, rub or gallop Extremities: right leg in [facility]  Data ReviewRadiology review: Prior CT and xrays shows anterior medial fragment off medial malleolus and tibial plafond  Assessment:   Active Problems:   * No active hospital problems. * displaced triamalleolar ankle fracture dislocation, with medial malleolus to be repaired today  Plan:    ORIF medial malleolus.

## 2019-03-14 NOTE — Op Note (Signed)
03/14/2019  11:36 AM  PATIENT:  Jasmine Morrow  74 y.o. female  PRE-OPERATIVE DIAGNOSIS:  CLOSED FRACTURE RIGHT ANKLE  POST-OPERATIVE DIAGNOSIS:  CLOSED FRACTURE RIGHT ANKLE  PROCEDURE:  Procedure(s): OPEN REDUCTION INTERNAL FIXATION (ORIF) ANKLE FRACTURE, MEDIAL MALLEOLUS (Right)  SURGEON: Laurene Footman, MD  ASSISTANTS: None  ANESTHESIA:   general  EBL:  Total I/O In: -  Out: 250 [Blood:250]  BLOOD ADMINISTERED:none  DRAINS: none   LOCAL MEDICATIONS USED:  NONE  SPECIMEN:  No Specimen  DISPOSITION OF SPECIMEN:  N/A  COUNTS:  YES  TOURNIQUET:  * Missing tourniquet times found for documented tourniquets in log: [id number] *  IMPLANTS: 34 mm cannulated screw x1  DICTATION: .Dragon Dictation patient was brought to the operating room and after adequate general anesthesia was obtained right leg was prepped and draped in the usual sterile fashion with a tourniquet applied the upper calf.  After patient identification and timeout procedures were completed, tourniquet was raised.  An incision was made over the medial malleolus away from the area of skin damage.  The skin was quite thin and there was some necrotic fat underlying it with a large hematoma present.  This was evacuated and the medial malleolar fragment was identified it was very anterior the joint was explored and there was a small loose fragment that was removed and there is thorough irrigation of the joint to get out any loose bodies.  The joint was joint fluid was also cultured at the start of the case.  This is due to a prior history of sepsis and concern that she could have had an infection in the ankle there is no evidence of that today.  After removing some scar tissue on both the malleolar fragment and the anterior medial tibia fragment could be brought to reduced position.  Was quite a bit of venous bleeding and so the tourniquet was let down and this actually led to less bleeding.  K wires were placed to hold the  fragment in position because of its size and thinness to terminate only single screw that he places a second screws.  Would cause comminution and lack of fixation.  When the K wire was in the appropriate position drilling was carried out followed by placing the 4.0334 mm screw with this gave good compression of the fracture with near-anatomic alignment with just a slightly widened clear space medially.  After thorough irrigation of the wound wound was closed with skin staples as the skin was so thin it would not hold subcutaneous sutures.  Xeroform sheet was placed over the medial ankle to help cover prior areas of skin blisters additionally there was noted to be a pressure sore with necrotic skin on the plantar lateral l aspect of the fifth metatarsal head this was also dressed with Xeroform and additional padding applied.  Following this short leg cast with appropriate padding was placed.  PLAN OF CARE: Transfer back to rehab facility in [place]:  PACU - hemodynamically stable.

## 2019-03-14 NOTE — OR Nursing (Signed)
Call nursing home and gave report to [facility] who advised she would arrange transportation with ETA of 45 minutes. Discharge instructions sent with pt. Pt. And husband made aware of plan.

## 2019-03-15 NOTE — Anesthesia Postprocedure Evaluation (Signed)
Anesthesia Post Note  Patient: Jasmine Morrow  Procedure(s) Performed: OPEN REDUCTION INTERNAL FIXATION (ORIF) ANKLE FRACTURE, MEDIAL MALLEOLUS (Right Ankle)  Patient location during evaluation: PACU Anesthesia Type: General Level of consciousness: awake and alert Pain management: pain level controlled Vital Signs Assessment: post-procedure vital signs reviewed and stable Respiratory status: spontaneous breathing, nonlabored ventilation, respiratory function stable and patient connected to nasal cannula oxygen Cardiovascular status: blood pressure returned to baseline and stable Postop Assessment: no apparent nausea or vomiting Anesthetic complications: no     Last Vitals:  Vitals:   03/14/19 1318 03/14/19 1500  BP: (!) 115/57 112/72  Pulse: 84 82  Resp: 12 14  Temp: (!) 36.3 C (!) 36.3 C  SpO2: 97%     Last Pain:  Vitals:   03/14/19 1500  TempSrc:   PainSc: 0-No pain                 Martha Clan

## 2019-03-19 LAB — AEROBIC/ANAEROBIC CULTURE W GRAM STAIN (SURGICAL/DEEP WOUND)
Culture: NO GROWTH
Gram Stain: NONE SEEN

## 2019-03-28 ENCOUNTER — Telehealth: Payer: Self-pay

## 2019-03-28 NOTE — Telephone Encounter (Signed)
Thanks, I confirmed with Dr.Menz and IV antibiotic can be stopped and PICC can be removed

## 2019-03-28 NOTE — Telephone Encounter (Signed)
Spoke to husband and he stated patient is at [facility] in [place]. I called to place verbal orders to remove PICC Line and Suanne Marker approved that she will remove after last dose 03/29/19. We also asked that labs be sent to [place] from this point forward.

## 2019-04-30 ENCOUNTER — Other Ambulatory Visit: Payer: Self-pay | Admitting: Infectious Diseases

## 2019-05-17 ENCOUNTER — Encounter: Payer: Self-pay | Admitting: *Deleted

## 2019-11-06 ENCOUNTER — Other Ambulatory Visit: Payer: Self-pay | Admitting: Orthopedic Surgery

## 2019-11-08 ENCOUNTER — Other Ambulatory Visit: Payer: Self-pay

## 2019-11-08 ENCOUNTER — Encounter
Admission: RE | Admit: 2019-11-08 | Discharge: 2019-11-08 | Disposition: A | Discharge status: Home / Self Care | Payer: Medicare Other | Source: Ambulatory Visit | Attending: Orthopedic Surgery | Admitting: Orthopedic Surgery

## 2019-11-08 HISTORY — DX: Anxiety disorder, unspecified: F41.9

## 2019-11-08 NOTE — Patient Instructions (Addendum)
Your procedure is scheduled on: 11/12/19 Report to [facility]. To find out your arrival time please call [phone] between 1PM - 3PM on 11/11/19.  Remember: Instructions that are not followed completely may result in serious medical risk, up to and including death, or upon the discretion of your surgeon and anesthesiologist your surgery may need to be rescheduled.     _X__ 1. Do not eat food after midnight the night before your procedure.                 No gum chewing or hard candies. You may drink clear liquids up to 2 hours                 before you are scheduled to arrive for your surgery- DO not drink clear                 liquids within 2 hours of the start of your surgery.                 Clear Liquids include:  water, apple juice without pulp, clear carbohydrate                 drink such as Clearfast or Gatorade, Black Coffee or Tea (Do not add                 anything to coffee or tea). Diabetics water only  __X__2.  On the morning of surgery brush your teeth with toothpaste and water, you                 may rinse your mouth with mouthwash if you wish.  Do not swallow any              toothpaste of mouthwash.     _X__ 3.  No Alcohol for 24 hours before or after surgery.   _X__ 4.  Do Not Smoke or use e-cigarettes For 24 Hours Prior to Your Surgery.                 Do not use any chewable tobacco products for at least 6 hours prior to                 surgery.  ____  5.  Bring all medications with you on the day of surgery if instructed.   __X__  6.  Notify your doctor if there is any change in your medical condition      (cold, fever, infections).     Do not wear jewelry, make-up, hairpins, clips or nail polish. Do not wear lotions, powders, or perfumes.  Do not shave 48 hours prior to surgery. Men may shave face and neck. Do not bring valuables to the hospital.    [facility] is not responsible for any belongings  or valuables.  Contacts, dentures/partials or body piercings may not be worn into surgery. Bring a case for your contacts, glasses or hearing aids, a denture cup will be supplied. Leave your suitcase in the car. After surgery it may be brought to your room. For patients admitted to the hospital, discharge time is determined by your treatment team.   Patients discharged the day of surgery will not be allowed to drive home.   Please read over the following fact sheets that you were given:   MRSA Information  __X__ Take these medicines the morning of surgery with A SIP OF WATER:  1. carvedilol (COREG) 3.125 MG tablet  2. cetirizine (ZYRTEC) 10 MG tablet  3. escitalopram (LEXAPRO) 10 MG tablet  4. pantoprazole (PROTONIX) 40 MG tablet  5.  6.  ____ Fleet Enema (as directed)   __X__ Use CHG Soap/SAGE wipes as directed  ____ Use inhalers on the day of surgery  ____ Stop metformin/Janumet/Farxiga 2 days prior to surgery    ____ Take 1/2 of usual insulin dose the night before surgery. No insulin the morning          of surgery.   ____ Stop Blood Thinners Coumadin/Plavix/Xarelto/Pleta/Pradaxa/Eliquis/Effient/Aspirin  on   Or contact your Surgeon, Cardiologist or Medical Doctor regarding  ability to stop your blood thinners  __X__ Stop Anti-inflammatories 7 days before surgery such as Advil, Ibuprofen, Motrin,  BC or Goodies Powder, Naprosyn, Naproxen, Aleve,     __X__ Stop all herbal supplements, fish oil or vitamin E until after surgery.    ____ Bring C-Pap to the hospital.       How to Use Chlorhexidine for Bathing Chlorhexidine gluconate (CHG) is a germ-killing (antiseptic) solution that is used to clean the skin. It can get rid of the bacteria that normally live on the skin and can keep them away for about 24 hours. To clean your skin with CHG, you may be given:  A CHG solution to use in the shower or as part of a sponge bath.  A prepackaged cloth that contains  CHG. Cleaning your skin with CHG may help lower the risk for infection:  While you are staying in the intensive care unit of the hospital.  If you have a vascular access, such as a central line, to provide short-term or long-term access to your veins.  If you have a catheter to drain urine from your bladder.  If you are on a ventilator. A ventilator is a machine that helps you breathe by moving air in and out of your lungs.  After surgery. What are the risks? Risks of using CHG include:  A skin reaction.  Hearing loss, if CHG gets in your ears.  Eye injury, if CHG gets in your eyes and is not rinsed out.  The CHG product catching fire. Make sure that you avoid smoking and flames after applying CHG to your skin. Do not use CHG:  If you have a chlorhexidine allergy or have previously reacted to chlorhexidine.  On babies younger than 16 months of age. How to use CHG solution  Use CHG only as told by your health care provider, and follow the instructions on the label.  Use the full amount of CHG as directed. Usually, this is one bottle. During a shower Follow these steps when using CHG solution during a shower (unless your health care provider gives you different instructions): 1. Start the shower. 2. Use your normal soap and shampoo to wash your face and hair. 3. Turn off the shower or move out of the shower stream. 4. Pour the CHG onto a clean washcloth. Do not use any type of brush or rough-edged sponge. 5. Starting at your neck, lather your body down to your toes. Make sure you follow these instructions: ? If you will be having surgery, pay special attention to the part of your body where you will be having surgery. Scrub this area for at least 1 minute. ? Do not use CHG on your head or face. If the solution gets into your ears or eyes, rinse them well with water. ? Avoid your genital  area. ? Avoid any areas of skin that have broken skin, cuts, or scrapes. ? Scrub your back  and under your arms. Make sure to wash skin folds. 6. Let the lather sit on your skin for 1-2 minutes or as long as told by your health care provider. 7. Thoroughly rinse your entire body in the shower. Make sure that all body creases and crevices are rinsed well. 8. Dry off with a clean towel. Do not put any substances on your body afterward--such as powder, lotion, or perfume--unless you are told to do so by your health care provider. Only use lotions that are recommended by the manufacturer. 9. Put on clean clothes or pajamas. 10. If it is the night before your surgery, sleep in clean sheets.  During a sponge bath Follow these steps when using CHG solution during a sponge bath (unless your health care provider gives you different instructions): 1. Use your normal soap and shampoo to wash your face and hair. 2. Pour the CHG onto a clean washcloth. 3. Starting at your neck, lather your body down to your toes. Make sure you follow these instructions: ? If you will be having surgery, pay special attention to the part of your body where you will be having surgery. Scrub this area for at least 1 minute. ? Do not use CHG on your head or face. If the solution gets into your ears or eyes, rinse them well with water. ? Avoid your genital area. ? Avoid any areas of skin that have broken skin, cuts, or scrapes. ? Scrub your back and under your arms. Make sure to wash skin folds. 4. Let the lather sit on your skin for 1-2 minutes or as long as told by your health care provider. 5. Using a different clean, wet washcloth, thoroughly rinse your entire body. Make sure that all body creases and crevices are rinsed well. 6. Dry off with a clean towel. Do not put any substances on your body afterward--such as powder, lotion, or perfume--unless you are told to do so by your health care provider. Only use lotions that are recommended by the manufacturer. 7. Put on clean clothes or pajamas. 8. If it is the night  before your surgery, sleep in clean sheets. How to use CHG prepackaged cloths  Only use CHG cloths as told by your health care provider, and follow the instructions on the label.  Use the CHG cloth on clean, dry skin.  Do not use the CHG cloth on your head or face unless your health care provider tells you to.  When washing with the CHG cloth: ? Avoid your genital area. ? Avoid any areas of skin that have broken skin, cuts, or scrapes. Before surgery Follow these steps when using a CHG cloth to clean before surgery (unless your health care provider gives you different instructions): 1. Using the CHG cloth, vigorously scrub the part of your body where you will be having surgery. Scrub using a back-and-forth motion for 3 minutes. The area on your body should be completely wet with CHG when you are done scrubbing. 2. Do not rinse. Discard the cloth and let the area air-dry. Do not put any substances on the area afterward, such as powder, lotion, or perfume. 3. Put on clean clothes or pajamas. 4. If it is the night before your surgery, sleep in clean sheets.  For general bathing Follow these steps when using CHG cloths for general bathing (unless your health care provider gives you different  instructions). 1. Use a separate CHG cloth for each area of your body. Make sure you wash between any folds of skin and between your fingers and toes. Wash your body in the following order, switching to a new cloth after each step: ? The front of your neck, shoulders, and chest. ? Both of your arms, under your arms, and your hands. ? Your stomach and groin area, avoiding the genitals. ? Your right leg and foot. ? Your left leg and foot. ? The back of your neck, your back, and your buttocks. 2. Do not rinse. Discard the cloth and let the area air-dry. Do not put any substances on your body afterward--such as powder, lotion, or perfume--unless you are told to do so by your health care provider. Only use  lotions that are recommended by the manufacturer. 3. Put on clean clothes or pajamas. Contact a health care provider if:  Your skin gets irritated after scrubbing.  You have questions about using your solution or cloth. Get help right away if:  Your eyes become very red or swollen.  Your eyes itch badly.  Your skin itches badly and is red or swollen.  Your hearing changes.  You have trouble seeing.  You have swelling or tingling in your mouth or throat.  You have trouble breathing.  You swallow any chlorhexidine. Summary  Chlorhexidine gluconate (CHG) is a germ-killing (antiseptic) solution that is used to clean the skin. Cleaning your skin with CHG may help to lower your risk for infection.  You may be given CHG to use for bathing. It may be in a bottle or in a prepackaged cloth to use on your skin. Carefully follow your health care provider's instructions and the instructions on the product label.  Do not use CHG if you have a chlorhexidine allergy.  Contact your health care provider if your skin gets irritated after scrubbing. This information is not intended to replace advice given to you by your health care provider. Make sure you discuss any questions you have with your health care provider. Document Revised: 04/05/2018 Document Reviewed: 12/15/2016 Elsevier Patient Education  [employer].   How to Use an Incentive Spirometer An incentive spirometer is a tool that measures how well you are filling your lungs with each breath. Learning to take long, deep breaths using this tool can help you keep your lungs clear and active. This may help to reverse or lessen your chance of developing breathing (pulmonary) problems, especially infection. You may be asked to use a spirometer:  After a surgery.  If you have a lung problem or a history of smoking.  After a long period of time when you have been unable to move or be active. If the spirometer includes an indicator  to show the highest number that you have reached, your health care provider or respiratory therapist will help you set a goal. Keep a list (log) of your progress as told by your health care provider. What are the risks?  Breathing too quickly may cause dizziness or cause you to pass out. Take your time so you do not get dizzy or light-headed.  If you are in pain, you may need to take pain medicine before doing incentive spirometry. It is harder to take a deep breath if you are having pain. How to use your incentive spirometer  1. Sit up on the edge of your bed or on a chair. 2. Hold the incentive spirometer so that it is in an upright  position. 3. Before you use the spirometer, breathe out normally. 4. Place the mouthpiece in your mouth. Make sure your lips are closed tightly around it. 5. Breathe in slowly and as deeply as you can through your mouth, causing the piston or the ball to rise toward the top of the chamber. 6. Hold your breath for 3-5 seconds, or for as long as possible. ? If the spirometer includes a coach indicator, use this to guide you in breathing. Slow down your breathing if the indicator goes above the marked areas. 7. Remove the mouthpiece from your mouth and breathe out normally. The piston or ball will return to the bottom of the chamber. 8. Rest for a few seconds, then repeat the steps 10 or more times. ? Take your time and take a few normal breaths between deep breaths so that you do not get dizzy or light-headed. ? Do this every 1-2 hours when you are awake. 9. If the spirometer includes a goal marker to show the highest number you have reached (best effort), use this as a goal to work toward during each repetition. 10. After each set of 10 deep breaths, cough a few times. This will help to make sure that your lungs are clear. ? If you have an incision on your chest or abdomen from surgery, place a pillow or a rolled-up towel firmly against the incision when you cough.  This can help to reduce pain from coughing. General tips  When you become able to get out of bed, walk around often and continue to cough to help clear your lungs.  Keep using the incentive spirometer until your health care provider says it is okay to stop using it. If you have been in the hospital, you may be told to keep using the spirometer at home. Contact a health care provider if:  You are having difficulty using the spirometer.  You have trouble using the spirometer as often as instructed.  Your pain medicine is not giving enough relief for you to use the spirometer as told.  You have a fever.  You develop shortness of breath. Get help right away if:  You develop a cough with bloody mucus from the lungs (bloody sputum).  You have fluid or blood coming from an incision site after you cough. Summary  An incentive spirometer is a tool that can help you learn to take long, deep breaths to keep your lungs clear and active.  You may be asked to use a spirometer after a surgery, if you have a lung problem or a history of smoking, or if you have been inactive for a long period of time.  Use your incentive spirometer as instructed every 1-2 hours while you are awake.  If you have an incision on your chest or abdomen, place a pillow or a rolled-up towel firmly against your incision when you cough. This will help to reduce pain. This information is not intended to replace advice given to you by your health care provider. Make sure you discuss any questions you have with your health care provider. Document Revised: 08/17/2018 Document Reviewed: 11/30/2016 Elsevier Patient Education  2020 [employer].

## 2019-11-11 MED ORDER — DEXTROSE 5 % IV SOLN
3.0000 g | INTRAVENOUS | Status: AC
  Administered 2019-11-12: 3 g via INTRAVENOUS
  Filled 2019-11-11: qty 3

## 2019-11-12 ENCOUNTER — Other Ambulatory Visit
Admission: RE | Admit: 2019-11-12 | Discharge: 2019-11-12 | Disposition: A | Discharge status: Home / Self Care | Payer: Medicare Other | Source: Ambulatory Visit | Attending: Orthopedic Surgery | Admitting: Orthopedic Surgery

## 2019-11-12 ENCOUNTER — Ambulatory Visit: Payer: Medicare Other

## 2019-11-12 ENCOUNTER — Ambulatory Visit: Payer: Medicare Other | Admitting: Anesthesiology

## 2019-11-12 ENCOUNTER — Encounter: Payer: Self-pay | Admitting: Orthopedic Surgery

## 2019-11-12 ENCOUNTER — Encounter
Admission: RE | Disposition: A | Discharge status: Home / Self Care | Payer: Self-pay | Source: Home / Self Care | Attending: Orthopedic Surgery

## 2019-11-12 ENCOUNTER — Ambulatory Visit
Admission: RE | Admit: 2019-11-12 | Discharge: 2019-11-12 | Disposition: A | Discharge status: Home / Self Care | Payer: Medicare Other | Attending: Orthopedic Surgery | Admitting: Orthopedic Surgery

## 2019-11-12 ENCOUNTER — Other Ambulatory Visit: Payer: Self-pay

## 2019-11-12 DIAGNOSIS — Z881 Allergy status to other antibiotic agents status: Secondary | ICD-10-CM | POA: Insufficient documentation

## 2019-11-12 DIAGNOSIS — F419 Anxiety disorder, unspecified: Secondary | ICD-10-CM | POA: Insufficient documentation

## 2019-11-12 DIAGNOSIS — M1990 Unspecified osteoarthritis, unspecified site: Secondary | ICD-10-CM | POA: Insufficient documentation

## 2019-11-12 DIAGNOSIS — Z96653 Presence of artificial knee joint, bilateral: Secondary | ICD-10-CM | POA: Diagnosis not present

## 2019-11-12 DIAGNOSIS — Z7982 Long term (current) use of aspirin: Secondary | ICD-10-CM | POA: Diagnosis not present

## 2019-11-12 DIAGNOSIS — Z79899 Other long term (current) drug therapy: Secondary | ICD-10-CM | POA: Diagnosis not present

## 2019-11-12 DIAGNOSIS — Z885 Allergy status to narcotic agent status: Secondary | ICD-10-CM | POA: Diagnosis not present

## 2019-11-12 DIAGNOSIS — S82892D Other fracture of left lower leg, subsequent encounter for closed fracture with routine healing: Secondary | ICD-10-CM | POA: Insufficient documentation

## 2019-11-12 DIAGNOSIS — Z20822 Contact with and (suspected) exposure to covid-19: Secondary | ICD-10-CM | POA: Diagnosis not present

## 2019-11-12 DIAGNOSIS — J45909 Unspecified asthma, uncomplicated: Secondary | ICD-10-CM | POA: Insufficient documentation

## 2019-11-12 DIAGNOSIS — I10 Essential (primary) hypertension: Secondary | ICD-10-CM | POA: Insufficient documentation

## 2019-11-12 DIAGNOSIS — T8484XA Pain due to internal orthopedic prosthetic devices, implants and grafts, initial encounter: Secondary | ICD-10-CM | POA: Diagnosis present

## 2019-11-12 DIAGNOSIS — K219 Gastro-esophageal reflux disease without esophagitis: Secondary | ICD-10-CM | POA: Diagnosis not present

## 2019-11-12 DIAGNOSIS — S82851D Displaced trimalleolar fracture of right lower leg, subsequent encounter for closed fracture with routine healing: Secondary | ICD-10-CM | POA: Insufficient documentation

## 2019-11-12 DIAGNOSIS — Y831 Surgical operation with implant of artificial internal device as the cause of abnormal reaction of the patient, or of later complication, without mention of misadventure at the time of the procedure: Secondary | ICD-10-CM | POA: Insufficient documentation

## 2019-11-12 DIAGNOSIS — Z888 Allergy status to other drugs, medicaments and biological substances status: Secondary | ICD-10-CM | POA: Diagnosis not present

## 2019-11-12 DIAGNOSIS — Z87891 Personal history of nicotine dependence: Secondary | ICD-10-CM | POA: Diagnosis not present

## 2019-11-12 DIAGNOSIS — G4730 Sleep apnea, unspecified: Secondary | ICD-10-CM | POA: Diagnosis not present

## 2019-11-12 DIAGNOSIS — Z882 Allergy status to sulfonamides status: Secondary | ICD-10-CM | POA: Diagnosis not present

## 2019-11-12 DIAGNOSIS — X58XXXD Exposure to other specified factors, subsequent encounter: Secondary | ICD-10-CM | POA: Diagnosis not present

## 2019-11-12 HISTORY — PX: HARDWARE REMOVAL: SHX979

## 2019-11-12 LAB — SARS CORONAVIRUS 2 BY RT PCR (HOSPITAL ORDER, PERFORMED IN ~~LOC~~ HOSPITAL LAB): SARS Coronavirus 2: NEGATIVE

## 2019-11-12 SURGERY — REMOVAL, HARDWARE
Anesthesia: General | Laterality: Right

## 2019-11-12 MED ORDER — METOCLOPRAMIDE HCL 10 MG PO TABS: 5.0000 mg | ORAL_TABLET | Freq: Three times a day (TID) | ORAL | Status: DC | PRN

## 2019-11-12 MED ORDER — TRAMADOL HCL 50 MG PO TABS: 50.0000 mg | ORAL_TABLET | Freq: Four times a day (QID) | ORAL | 0 refills | Status: DC | PRN

## 2019-11-12 MED ORDER — PROPOFOL 500 MG/50ML IV EMUL
INTRAVENOUS | Status: DC | PRN
  Administered 2019-11-12: 125 ug/kg/min via INTRAVENOUS

## 2019-11-12 MED ORDER — METOCLOPRAMIDE HCL 5 MG/ML IJ SOLN: 5.0000 mg | Freq: Three times a day (TID) | INTRAMUSCULAR | Status: DC | PRN

## 2019-11-12 MED ORDER — FENTANYL CITRATE (PF) 100 MCG/2ML IJ SOLN
INTRAMUSCULAR | Status: AC
  Filled 2019-11-12: qty 2

## 2019-11-12 MED ORDER — ONDANSETRON HCL 4 MG PO TABS: 4.0000 mg | ORAL_TABLET | Freq: Four times a day (QID) | ORAL | Status: DC | PRN

## 2019-11-12 MED ORDER — PROPOFOL 10 MG/ML IV BOLUS
INTRAVENOUS | Status: DC | PRN
  Administered 2019-11-12: 30 mg via INTRAVENOUS
  Administered 2019-11-12: 20 mg via INTRAVENOUS

## 2019-11-12 MED ORDER — FENTANYL CITRATE (PF) 100 MCG/2ML IJ SOLN
INTRAMUSCULAR | Status: DC | PRN
  Administered 2019-11-12 (×2): 25 ug via INTRAVENOUS

## 2019-11-12 MED ORDER — ONDANSETRON HCL 4 MG/2ML IJ SOLN: 4.0000 mg | Freq: Four times a day (QID) | INTRAMUSCULAR | Status: DC | PRN

## 2019-11-12 MED ORDER — TRAMADOL HCL 50 MG PO TABS
ORAL_TABLET | ORAL | Status: AC
  Filled 2019-11-12: qty 1

## 2019-11-12 MED ORDER — PHENYLEPHRINE HCL (PRESSORS) 10 MG/ML IV SOLN
INTRAVENOUS | Status: DC | PRN
  Administered 2019-11-12: 50 ug via INTRAVENOUS
  Administered 2019-11-12: 100 ug via INTRAVENOUS
  Administered 2019-11-12: 50 ug via INTRAVENOUS

## 2019-11-12 MED ORDER — CHLORHEXIDINE GLUCONATE 0.12 % MT SOLN
OROMUCOSAL | Status: AC
  Filled 2019-11-12: qty 15

## 2019-11-12 MED ORDER — LACTATED RINGERS IV SOLN: INTRAVENOUS | Status: DC

## 2019-11-12 MED ORDER — LIDOCAINE HCL 1 % IJ SOLN
INTRAMUSCULAR | Status: DC | PRN
  Administered 2019-11-12: 10 mL

## 2019-11-12 MED ORDER — SODIUM CHLORIDE 0.9 % IV SOLN: INTRAVENOUS | Status: DC

## 2019-11-12 MED ORDER — FENTANYL CITRATE (PF) 100 MCG/2ML IJ SOLN
25.0000 ug | INTRAMUSCULAR | Status: DC | PRN
  Administered 2019-11-12: 50 ug via INTRAVENOUS

## 2019-11-12 MED ORDER — CHLORHEXIDINE GLUCONATE 0.12 % MT SOLN
15.0000 mL | Freq: Once | OROMUCOSAL | Status: AC
  Administered 2019-11-12: 15 mL via OROMUCOSAL

## 2019-11-12 MED ORDER — TRAMADOL HCL 50 MG PO TABS
50.0000 mg | ORAL_TABLET | Freq: Once | ORAL | Status: AC
  Administered 2019-11-12: 50 mg via ORAL

## 2019-11-12 MED ORDER — ORAL CARE MOUTH RINSE: 15.0000 mL | Freq: Once | OROMUCOSAL | Status: AC

## 2019-11-12 MED ORDER — BUPIVACAINE HCL (PF) 0.5 % IJ SOLN
INTRAMUSCULAR | Status: DC | PRN
  Administered 2019-11-12: 10 mL

## 2019-11-12 MED ORDER — ONDANSETRON HCL 4 MG/2ML IJ SOLN: 4.0000 mg | Freq: Once | INTRAMUSCULAR | Status: DC | PRN

## 2019-11-12 SURGICAL SUPPLY — 39 items
APL PRP STRL LF DISP 70% ISPRP (MISCELLANEOUS) ×1
CANISTER SUCT 1200ML W/VALVE (MISCELLANEOUS) ×2 IMPLANT
CHLORAPREP W/TINT 26 (MISCELLANEOUS) ×2 IMPLANT
COVER WAND RF STERILE (DRAPES) ×2 IMPLANT
CUFF TOURN SGL QUICK 24 (TOURNIQUET CUFF)
CUFF TOURN SGL QUICK 30 (TOURNIQUET CUFF)
CUFF TRNQT CYL 24X4X16.5-23 (TOURNIQUET CUFF) IMPLANT
CUFF TRNQT CYL 30X4X21-28X (TOURNIQUET CUFF) IMPLANT
DRAPE C-ARM XRAY 36X54 (DRAPES) ×2 IMPLANT
DRAPE INCISE IOBAN 66X45 STRL (DRAPES) ×2 IMPLANT
DRSG EMULSION OIL 3X8 NADH (GAUZE/BANDAGES/DRESSINGS) ×2 IMPLANT
ELECT CAUTERY BLADE 6.4 (BLADE) ×2 IMPLANT
ELECT REM PT RETURN 9FT ADLT (ELECTROSURGICAL) ×2
ELECTRODE REM PT RTRN 9FT ADLT (ELECTROSURGICAL) ×1 IMPLANT
GAUZE SPONGE 4X4 12PLY STRL (GAUZE/BANDAGES/DRESSINGS) ×2 IMPLANT
GAUZE XEROFORM 1X8 LF (GAUZE/BANDAGES/DRESSINGS) ×2 IMPLANT
GLOVE SURG SYN 9.0  PF PI (GLOVE) ×1
GLOVE SURG SYN 9.0 PF PI (GLOVE) ×1 IMPLANT
GOWN SRG 2XL LVL 4 RGLN SLV (GOWNS) ×1 IMPLANT
GOWN STRL NON-REIN 2XL LVL4 (GOWNS) ×2
GOWN STRL REUS W/ TWL LRG LVL3 (GOWN DISPOSABLE) ×1 IMPLANT
GOWN STRL REUS W/TWL LRG LVL3 (GOWN DISPOSABLE) ×2
KIT TURNOVER KIT A (KITS) ×2 IMPLANT
NDL FILTER BLUNT 18X1 1/2 (NEEDLE) ×1 IMPLANT
NEEDLE FILTER BLUNT 18X 1/2SAF (NEEDLE) ×1
NEEDLE FILTER BLUNT 18X1 1/2 (NEEDLE) ×1 IMPLANT
NS IRRIG 1000ML POUR BTL (IV SOLUTION) ×2 IMPLANT
PACK EXTREMITY (MISCELLANEOUS) ×2 IMPLANT
PAD ABD DERMACEA PRESS 5X9 (GAUZE/BANDAGES/DRESSINGS) ×4 IMPLANT
SCALPEL PROTECTED #15 DISP (BLADE) ×4 IMPLANT
STAPLER SKIN PROX 35W (STAPLE) ×2 IMPLANT
SUT ETHIBOND NAB CT1 #1 30IN (SUTURE) ×2 IMPLANT
SUT ETHILON 3-0 FS-10 30 BLK (SUTURE) ×2
SUT VIC AB 0 CT1 36 (SUTURE) ×2 IMPLANT
SUT VIC AB 2-0 CT1 27 (SUTURE) ×2
SUT VIC AB 2-0 CT1 TAPERPNT 27 (SUTURE) ×1 IMPLANT
SUTURE EHLN 3-0 FS-10 30 BLK (SUTURE) ×1 IMPLANT
SYR 10ML LL (SYRINGE) ×2 IMPLANT
WATER STERILE IRR 1000ML POUR (IV SOLUTION) ×2 IMPLANT

## 2019-11-12 NOTE — Transfer of Care (Signed)
Immediate Anesthesia Transfer of Care Note  Patient: Jasmine Morrow  Procedure(s) Performed: Right ankle hardware removal (Right )  Patient Location: PACU  Anesthesia Type:General  Level of Consciousness: awake and sedated  Airway & Oxygen Therapy: Patient Spontanous Breathing and Patient connected to face mask oxygen  Post-op Assessment: Report given to RN and Post -op Vital signs reviewed and stable  Post vital signs: Reviewed and stable  Last Vitals:  Vitals Value Taken Time  BP 110/66 11/12/19 1154  Temp    Pulse 93 11/12/19 1156  Resp 19 11/12/19 1156  SpO2 100 % 11/12/19 1156  Vitals shown include unvalidated device data.  Last Pain:  Vitals:   11/12/19 0918  TempSrc: Tympanic  PainSc: 0-No pain         Complications: No complications documented.

## 2019-11-12 NOTE — Discharge Instructions (Addendum)
Keep ankle elevated is much as possible at home over the next 2 weeks. Leave wound VAC on for 1 week and then have home health remove it and apply new dressing. Weightbearing as tolerated on right leg. Pain medicine as directed. Call office if you are having problems.  AMBULATORY SURGERY  DISCHARGE INSTRUCTIONS   1) The drugs that you were given will stay in your system until tomorrow so for the next 24 hours you should not:  A) Drive an automobile B) Make any legal decisions C) Drink any alcoholic beverage   2) You may resume regular meals tomorrow.  Today it is better to start with liquids and gradually work up to solid foods.  You may eat anything you prefer, but it is better to start with liquids, then soup and crackers, and gradually work up to solid foods.   3) Please notify your doctor immediately if you have any unusual bleeding, trouble breathing, redness and pain at the surgery site, drainage, fever, or pain not relieved by medication.    4) Additional Instructions:        Please contact your physician with any problems or Same Day Surgery at [phone], Monday through Friday 6 am to 4 pm, or [place] [facility] number at [phone].

## 2019-11-12 NOTE — Anesthesia Preprocedure Evaluation (Signed)
Anesthesia Evaluation  Patient identified by MRN, date of birth, ID band Patient awake    Reviewed: Allergy & Precautions, NPO status , Patient's Chart, lab work & pertinent test results  History of Anesthesia Complications Negative for: history of anesthetic complications  Airway Mallampati: III  TM Distance: >3 FB Neck ROM: Full    Dental  (+) Implants   Pulmonary asthma , sleep apnea and Continuous Positive Airway Pressure Ventilation , former smoker,    breath sounds clear to auscultation- rhonchi (-) wheezing      Cardiovascular hypertension, Pt. on medications (-) CAD, (-) Past MI, (-) Cardiac Stents and (-) CABG  Rhythm:Regular Rate:Normal - Systolic murmurs and - Diastolic murmurs    Neuro/Psych neg Seizures Anxiety negative neurological ROS     GI/Hepatic Neg liver ROS, GERD  ,  Endo/Other  negative endocrine ROSneg diabetes  Renal/GU negative Renal ROS     Musculoskeletal negative musculoskeletal ROS (+)   Abdominal (+) + obese,   Peds  Hematology  (+) anemia ,   Anesthesia Other Findings Past Medical History: No date: Anxiety No date: Asthma No date: GERD (gastroesophageal reflux disease) No date: Hypertension No date: Seasonal allergies No date: Sleep apnea   Reproductive/Obstetrics                             Anesthesia Physical Anesthesia Plan  ASA: III  Anesthesia Plan: General   Post-op Pain Management:    Induction: Intravenous  PONV Risk Score and Plan: 2 and Propofol infusion  Airway Management Planned: Natural Airway  Additional Equipment:   Intra-op Plan:   Post-operative Plan:   Informed Consent: I have reviewed the patients History and Physical, chart, labs and discussed the procedure including the risks, benefits and alternatives for the proposed anesthesia with the patient or authorized representative who has indicated his/her understanding and  acceptance.     Dental advisory given  Plan Discussed with: CRNA and Anesthesiologist  Anesthesia Plan Comments:         Anesthesia Quick Evaluation

## 2019-11-12 NOTE — Anesthesia Procedure Notes (Signed)
Date/Time: 11/12/2019 11:13 AM Performed by: Nelda Marseille, CRNA Pre-anesthesia Checklist: Patient identified, Emergency Drugs available, Suction available and Patient being monitored Oxygen Delivery Method: Simple face mask

## 2019-11-12 NOTE — Progress Notes (Signed)
Pt states she took 81mg  of Asprin last night, Dr. Andree Coss aware.

## 2019-11-12 NOTE — H&P (Signed)
Chief Complaint  Patient presents with  . Follow-up  S/P Rt Ankle ORIF 03/14/19    History of the Present Illness: Jasmine Morrow is a 74 y.o. female here today.   The patient presents for follow-up evaluation status post right ankle ORIF. She had some breakdown of her medial wound. She had extensive blistering at the time of her fracture. She had a delayed repair of the medial malleolus. Recently, she had some drainage at the ankle. The patient states she has lost about 60 to 70 pounds.   The patient states her foot did not bother her at all until the the blister burst. She is not having much pain at the right ankle, but is having more pain when she trying to walk on her right foot.   She states she has a lot of constipation and stomach troubles.   The patient takes aspirin.   I have reviewed past medical, surgical, social and family history, and allergies as documented in the EMR.  Past Medical History: Past Medical History:  Diagnosis Date  . Anemia  . Arthritis  . Asthma without status asthmaticus, unspecified  . History of chest pain  Chest pain with abnormal Myoview treadmill stress test 09/2011. Cardiac cath 10/2011 showed completely normal coronary arteries, however she did have mild left ventricular dysfunction with anterior wall motion abnormality. Left ventricular EF was 51%.  . History of chickenpox  . Hyperlipidemia  . Hypertension  . 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 Macular degeneration  . Multiple thyroid nodules  . Osteoarthritis  . Ovarian cyst  . Seasonal allergies  . Sinusitis, unspecified  . Sleep apnea  On CPAP.  Marland Kitchen Ulcer   Past Surgical History: Past Surgical History:  Procedure Laterality Date  . ANKLE ARTHODESIS W/ ARTHROSCOPY Right 02/08/2019  03/14/2019 second surgery  . CESAREAN SECTION  . CHOLECYSTECTOMY  . COLONOSCOPY 07/30/2012  internal hemorrhoids, diverticulosis  . COLONOSCOPY 02/22/2019  Blood in entire colon/Diverticulosis  - Presumed diverticular bleed. No repeat recommended per TKT.  . EGD 02/22/2019  Gastritis/gastric ulcers/Hiatal hernia/Otherwise normal - no repeat recommended per TKT.  Marland Kitchen Left total knee arthroplasty 10/12/2007  Dr Marry Guan  . ORIF ANKLE FRACTURE Right 03/14/2019  Dr. Rudene Christians  . Right total knee arthroplasty 10/11/2006  Dr Marry Guan  . SALPINGO OOPHORECTOMY Bilateral 1993   Past Family History: Family History  Problem Relation Age of Onset  . High blood pressure (Hypertension) Mother  . Bone cancer Mother  . Osteoporosis (Thinning of bones) Mother  . COPD Mother  . No Known Problems Father   Medications: Current Outpatient Medications Ordered in Epic  Medication Sig Dispense Refill  . acetaminophen (TYLENOL) 500 MG tablet Take 500 mg by mouth every 6 (six) hours as needed for Pain.  Marland Kitchen aspirin 81 MG EC tablet Take 81 mg by mouth once daily.  . calcium carbonate-vitamin D3 (CALTRATE 600+D) 600 mg(1,500mg ) -200 unit tablet Take 1 tablet by mouth 2 (two) times daily with meals.  . carvediloL (COREG) 3.125 MG tablet TAKE ONE TABLET TWICE DAILY WITH MEALS 180 tablet 3  . cetirizine (ZYRTEC) 10 mg capsule Take 10 mg by mouth once daily.  . Compound Medication Hydrocortisone 25 mg vaginal suppository Insert supp in vagina 3 times weekly Disp 12 with 3 refills 12 each 3  . escitalopram oxalate (LEXAPRO) 10 MG tablet TAKE 1 TABLET BY MOUTH DAILY 30 tablet 2  . multivit-min-FA-lycopen-lutein (CENTRUM SILVER) 0.4-300-250 mg-mcg-mcg Tab Take by mouth.  . nitrofurantoin, macrocrystal-monohydrate, (MACROBID)  100 MG capsule Take 1 capsule (100 mg total) by mouth 2 (two) times daily for 14 days 28 capsule 0  . nystatin (MYCOSTATIN) 100,000 unit/gram ointment Apply topically 2 (two) times daily 30 g 6  . pantoprazole (PROTONIX) 40 MG DR tablet Take 1 tablet (40 mg total) by mouth once daily 30 tablet 11  . traMADoL (ULTRAM) 50 mg tablet Take 1 tablet (50 mg total) by mouth every 6 (six) hours as needed for  Pain 40 tablet 2  . vit C,E-Zn-coppr-lutein-zeaxan (PRESERVISION AREDS 2) 250-200-40-1 mg-unit-mg-mg Cap Take by mouth 2 (two) times daily.   No current Epic-ordered facility-administered medications on file.   Allergies: Allergies  Allergen Reactions  . Avinza [Morphine] Itching  . Cipro Hc [Ciprofloxacin-Hydrocortisone] Rash  . Clindamycin Headache  . Darvocet A500 [Propoxyphene N-Acetaminophen] Nausea  . Nickel Rash  . Singulair [Montelukast] Other (See Comments)  Gland swelling  . Sulfa (Sulfonamide Antibiotics) Swelling  . Ultracet [Tramadol-Acetaminophen] Rash  . Vicodin [Hydrocodone-Acetaminophen] Vomiting  . Vioxx [Rofecoxib] Other (See Comments)  GI    Body mass index is 38.01 kg/m.  Review of Systems: A comprehensive 14 point ROS was performed, reviewed, and the pertinent orthopaedic findings are documented in the HPI.  Vitals:  11/06/19 0945  BP: 138/76    General Physical Examination:   General/Constitutional: No apparent distress: well-nourished and well developed. Eyes: Pupils equal, round with synchronous movement. Lungs: Clear to auscultation HEENT: Normal Vascular: No edema, swelling or tenderness, except as noted in detailed exam. Cardiac: Heart rate and rhythm is regular. Integumentary: No impressive skin lesions present, except as noted in detailed exam. Neuro/Psych: Normal mood and affect, oriented to person, place and time.  Musculoskeletal Examination:  On exam, the patient has a 1 cm in diameter wound on the right ankle at the hardware site.  Radiographs:  X-ray taken today with a paperclip over her wound. AP, lateral, and oblique x-rays of the right ankle were ordered and personally reviewed today. These show the paperclip is just above the distal syndesmosis TightRope, so both of those might be involved, as the more proximal one is just posterior and the more distal one is just distal to the paperclip.  Assessment: ICD-10-CM  1. S/P  ORIF (open reduction internal fixation) fracture Z98.890  Z87.81  2. Closed trimalleolar fracture of right ankle with routine healing, subsequent encounter S82.851D  3. Ankle wound, left, subsequent encounter S91.002D   Plan:  The patient has clinical findings of right ankle wound status post ORIF.  We discussed the patient's x-ray findings. I recommend removal of both the syndesmosis fixation devices, as they should no longer be necessary, and that may offer resolution of the granulation tissue and open wound. I explained the surgery and postoperative course in detail. The patient's wound was rebandaged today. I advised her to use some peroxide on a gauze pad applied to the wound for approximately 10 minutes, then blot it dry and rebandage the wound.  We will schedule the patient for surgery in the near future.  Surgical Risks:  The nature of the condition and the proposed procedure has been reviewed in detail with the patient. Surgical versus non-surgical options and prognosis for recovery have been reviewed and the inherent risks and benefits of each have been discussed including the risks of infection, bleeding, injury to nerves/blood vessels/tendons, incomplete relief of symptoms, persisting pain and/or stiffness, loss of function, complex regional pain syndrome, failure of the procedure, as appropriate.  Attestation: I, Dawn Royse, am documenting  for [facility], MD utilizing [place].    Reviewed paper H+P,  No changes noted.

## 2019-11-12 NOTE — Anesthesia Postprocedure Evaluation (Signed)
Anesthesia Post Note  Patient: Jasmine Morrow  Procedure(s) Performed: Right ankle hardware removal (Right )  Patient location during evaluation: PACU Anesthesia Type: General Level of consciousness: awake and alert and oriented Pain management: pain level controlled Vital Signs Assessment: post-procedure vital signs reviewed and stable Respiratory status: spontaneous breathing, nonlabored ventilation and respiratory function stable Cardiovascular status: blood pressure returned to baseline and stable Postop Assessment: no signs of nausea or vomiting Anesthetic complications: no   No complications documented.   Last Vitals:  Vitals:   11/12/19 1253 11/12/19 1300  BP: 130/63 (!) 110/50  Pulse: 66 67  Resp: 20 20  Temp: (!) 36.2 C   SpO2: 99% 99%    Last Pain:  Vitals:   11/12/19 1253  TempSrc: Temporal  PainSc: 4                  Lateefa Crosby

## 2019-11-12 NOTE — Op Note (Signed)
11/12/2019  11:51 AM  PATIENT:  Jasmine Morrow  74 y.o. female  PRE-OPERATIVE DIAGNOSIS:  S/P ORIF open reduction internal fixation fracture Z98.890, Z87.81 Closed trimalleolar fracture of right ankle with routine healing, subsequent encounter S82.851D  painful hardware with open wound  POST-OPERATIVE DIAGNOSIS:  S/P ORIF open reduction internal fixation fracture Z98.890, Z87.81 Closed trimalleolar fracture of rig painful hardware with open wound  PROCEDURE:  Procedure(s): Right ankle hardware removal (Right)  SURGEON: Laurene Footman, MD  ASSISTANTS: None  ANESTHESIA:   local and IV sedation  EBL:  Total I/O In: 750 [I.V.:700; IV Piggyback:50] Out: 5 [Blood:5]  BLOOD ADMINISTERED:none  DRAINS: Incisional wound VAC applied   LOCAL MEDICATIONS USED:  MARCAINE    and XYLOCAINE   SPECIMEN:  Source of Specimen:  Culture x1  DISPOSITION OF SPECIMEN:  Microbiology  COUNTS:  YES  TOURNIQUET:  * No tourniquets in log *  IMPLANTS: None  DICTATION: .Dragon Dictation patient was brought to the operating room and after adequate anesthesia was obtained with sedation and appropriate patient identification timeout procedure were completed 10 cc 1% Xylocaine 10 cc half percent Sensorcaine plain were infiltrated in the area of the planned incision and around the open wound.  The leg was then prepped and draped in the usual sterile fashion and repeat timeout procedure carried out.  With the mini C arm get aiding in finding the prior zip tight anchors on the medial side of the ankle the 2 anchors were identified and with traction the sutures came out with them there is no purulent material but a culture was obtained over the bone.  The wound was then thoroughly irrigated no incision was required just going through the area of skin breakdown approximately a centimeter half in diameter.  With Praveena applied patient was sent to recovery in stable condition  PLAN OF CARE: Discharge to home  after PACU  PATIENT DISPOSITION:  PACU - hemodynamically stable.

## 2019-11-13 ENCOUNTER — Encounter: Payer: Self-pay | Admitting: Orthopedic Surgery

## 2019-11-17 LAB — AEROBIC/ANAEROBIC CULTURE W GRAM STAIN (SURGICAL/DEEP WOUND)

## 2020-01-01 HISTORY — PX: SCAR DEBRIDEMENT OF TOTAL KNEE: SHX6544

## 2020-01-08 ENCOUNTER — Other Ambulatory Visit: Payer: Self-pay

## 2020-01-08 ENCOUNTER — Encounter: Payer: Medicare Other | Attending: Internal Medicine | Admitting: Internal Medicine

## 2020-01-08 DIAGNOSIS — L97319 Non-pressure chronic ulcer of right ankle with unspecified severity: Secondary | ICD-10-CM | POA: Insufficient documentation

## 2020-01-08 DIAGNOSIS — I10 Essential (primary) hypertension: Secondary | ICD-10-CM | POA: Insufficient documentation

## 2020-01-09 ENCOUNTER — Other Ambulatory Visit
Admission: RE | Admit: 2020-01-09 | Discharge: 2020-01-09 | Disposition: A | Discharge status: Home / Self Care | Payer: Medicare Other | Source: Ambulatory Visit | Attending: Internal Medicine | Admitting: Internal Medicine

## 2020-01-09 DIAGNOSIS — L089 Local infection of the skin and subcutaneous tissue, unspecified: Secondary | ICD-10-CM | POA: Diagnosis present

## 2020-01-09 NOTE — Progress Notes (Signed)
SHARIECE, VIVEIROS ([id number]) Visit Report for 01/08/2020 Abuse/Suicide Risk Screen Details Patient Name: Jasmine Morrow, Jasmine Morrow. Date of Service: 01/08/2020 12:45 PM Medical Record Number: [id number] Patient Account Number: [id number] Date of Birth/Sex: [birth year] (74 y.o. Female) Treating RN: Dolan Amen Primary Care Maanav Kassabian: Emily Filbert Other Clinician: Referring Naaman Curro: Emily Filbert Treating Meridian Scherger/Extender: Tito Dine in Treatment: 0 Abuse/Suicide Risk Screen Items Answer ABUSE RISK SCREEN: Has anyone close to you tried to hurt or harm you recentlyo No Do you feel uncomfortable with anyone in your familyo No Has anyone forced you do things that you didnot want to doo No Electronic Signature(s) Signed: 01/09/2020 4:55:46 PM By: Georges Mouse, Minus Breeding Entered By: Georges Mouse, Minus Breeding on 01/08/2020 13:40:02 Jasmine Morrow ([id number]) -------------------------------------------------------------------------------- Activities of Daily Living Details Patient Name: Jasmine Morrow, Jasmine Morrow. Date of Service: 01/08/2020 12:45 PM Medical Record Number: [id number] Patient Account Number: [id number] Date of Birth/Sex: [birth year] (74 y.o. Female) Treating RN: Dolan Amen Primary Care Tacia Hindley: Emily Filbert Other Clinician: Referring Arlee Santosuosso: Emily Filbert Treating Mardy Hoppe/Extender: Tito Dine in Treatment: 0 Activities of Daily Living Items Answer Activities of Daily Living (Please select one for each item) Drive Automobile Not Able Take Medications Need Assistance Use Telephone Need Assistance Care for Appearance Need Assistance Use Toilet Need Assistance Bath / Shower Need Assistance Dress Self Need Assistance Feed Self Need Assistance Walk Need Assistance Get In / Out Bed Need Assistance Housework Need Assistance Prepare Meals Need Assistance Handle Money Need Assistance Shop for Self Need Assistance Electronic Signature(s) Signed: 01/09/2020 4:55:46  PM By: Georges Mouse, Minus Breeding Entered By: Georges Mouse, Minus Breeding on 01/08/2020 13:40:28 Jasmine Morrow ([id number]) -------------------------------------------------------------------------------- Education Screening Details Patient Name: Jasmine Morrow. Date of Service: 01/08/2020 12:45 PM Medical Record Number: [id number] Patient Account Number: [id number] Date of Birth/Sex: 1945/02/15 (74 y.o. Female) Treating RN: Dolan Amen Primary Care Deamber Buckhalter: Emily Filbert Other Clinician: Referring Bricelyn Freestone: Emily Filbert Treating Tesslyn Baumert/Extender: Tito Dine in Treatment: 0 Learning Preferences/Education Level/Primary Language Learning Preference: Explanation, Demonstration Highest Education Level: College or Above Preferred Language: English Cognitive Barrier Language Barrier: No Translator Needed: No Memory Deficit: No Emotional Barrier: No Cultural/Religious Beliefs Affecting Medical Care: No Physical Barrier Impaired Vision: No Impaired Hearing: No Decreased Hand dexterity: No Knowledge/Comprehension Knowledge Level: High Comprehension Level: High Ability to understand written instructions: High Ability to understand verbal instructions: High Motivation Anxiety Level: Anxious Cooperation: Cooperative Education Importance: Acknowledges Need Interest in Health Problems: Asks Questions Perception: Coherent Willingness to Engage in Self-Management High Activities: Readiness to Engage in Self-Management High Activities: Electronic Signature(s) Signed: 01/09/2020 4:55:46 PM By: Georges Mouse, Minus Breeding Entered By: Georges Mouse, Minus Breeding on 01/08/2020 13:41:02 Jasmine Morrow ([id number]) -------------------------------------------------------------------------------- Fall Risk Assessment Details Patient Name: Jasmine Morrow. Date of Service: 01/08/2020 12:45 PM Medical Record Number: [id number] Patient Account Number: [id number] Date of Birth/Sex: 09/05/1945  (74 y.o. Female) Treating RN: Dolan Amen Primary Care Lorah Kalina: Emily Filbert Other Clinician: Referring Hakeem Frazzini: Emily Filbert Treating Candance Bohlman/Extender: Tito Dine in Treatment: 0 Fall Risk Assessment Items Have you had 2 or more falls in the last 12 monthso 0 Yes Have you had any fall that resulted in injury in the last 12 monthso 0 Yes FALLS RISK SCREEN History of falling - immediate or within 3 months 0 No Secondary diagnosis (Do you have 2 or more medical diagnoseso) 15 Yes Ambulatory aid None/bed rest/wheelchair/nurse 0 Yes Crutches/cane/walker 15 Yes Furniture 0 No Intravenous therapy Access/Saline/Heparin Lock 0 No Gait/Transferring Normal/ bed rest/ wheelchair 0 No  Weak (short steps with or without shuffle, stooped but able to lift head while walking, may 10 Yes seek support from furniture) Impaired (short steps with shuffle, may have difficulty arising from chair, head down, impaired 0 No balance) Mental Status Oriented to own ability 0 Yes Electronic Signature(s) Signed: 01/09/2020 4:55:46 PM By: Georges Mouse, Minus Breeding Entered By: Georges Mouse, Kenia on 01/08/2020 13:41:35 Jasmine Morrow ([id number]) -------------------------------------------------------------------------------- Foot Assessment Details Patient Name: Jasmine Morrow. Date of Service: 01/08/2020 12:45 PM Medical Record Number: [id number] Patient Account Number: [id number] Date of Birth/Sex: [birth year] (74 y.o. Female) Treating RN: Dolan Amen Primary Care Tzipora Mcinroy: Emily Filbert Other Clinician: Referring Quashawn Jewkes: Emily Filbert Treating Kinisha Soper/Extender: Tito Dine in Treatment: 0 Foot Assessment Items Site Locations + = Sensation present, - = Sensation absent, C = Callus, U = Ulcer R = Redness, W = Warmth, M = Maceration, PU = Pre-ulcerative lesion F = Fissure, S = Swelling, D = Dryness Assessment Right: Left: Other Deformity: No No Prior Foot Ulcer: No  No Prior Amputation: No No Charcot Joint: No No Ambulatory Status: Ambulatory With Help Assistance Device: Walker Gait: Administrator, arts) Signed: 01/09/2020 4:55:46 PM By: Georges Mouse, Minus Breeding Entered By: Georges Mouse, Minus Breeding on 01/08/2020 13:49:37 Jasmine Morrow ([id number]) -------------------------------------------------------------------------------- Nutrition Risk Screening Details Patient Name: Jasmine Morrow. Date of Service: 01/08/2020 12:45 PM Medical Record Number: [id number] Patient Account Number: [id number] Date of Birth/Sex: 02-15-1945 (74 y.o. Female) Treating RN: Dolan Amen Primary Care Lawan Nanez: Emily Filbert Other Clinician: Referring Dareth Andrew: Emily Filbert Treating Channon Ambrosini/Extender: Tito Dine in Treatment: 0 Height (in): Weight (lbs): Body Mass Index (BMI): Nutrition Risk Screening Items Score Screening NUTRITION RISK SCREEN: I have an illness or condition that made me change the kind and/or amount of food I eat 0 No I eat fewer than two meals per day 0 No I eat few fruits and vegetables, or milk products 2 Yes I have three or more drinks of beer, liquor or wine almost every day 0 No I have tooth or mouth problems that make it hard for me to eat 0 No I don't always have enough money to buy the food I need 0 No I eat alone most of the time 0 No I take three or more different prescribed or over-the-counter drugs a day 1 Yes Without wanting to, I have lost or gained 10 pounds in the last six months 0 No I am not always physically able to shop, cook and/or feed myself 2 Yes Nutrition Protocols Good Risk Protocol Moderate Risk Protocol 0 Provide education on nutrition High Risk Proctocol Risk Level: Moderate Risk Score: 5 Electronic Signature(s) Signed: 01/09/2020 4:55:46 PM By: Georges Mouse, Kenia Entered By: Georges Mouse, Minus Breeding on 01/08/2020 13:43:16

## 2020-01-12 LAB — AEROBIC CULTURE W GRAM STAIN (SUPERFICIAL SPECIMEN): Gram Stain: NONE SEEN

## 2020-01-15 ENCOUNTER — Other Ambulatory Visit: Payer: Self-pay

## 2020-01-15 ENCOUNTER — Encounter: Payer: Medicare Other | Admitting: Internal Medicine

## 2020-01-15 DIAGNOSIS — L97319 Non-pressure chronic ulcer of right ankle with unspecified severity: Secondary | ICD-10-CM | POA: Diagnosis not present

## 2020-01-17 NOTE — Progress Notes (Signed)
Jasmine Morrow, Jasmine Morrow ([id number]) Visit Report for 01/15/2020 Arrival Information Details Patient Name: Jasmine Morrow, Jasmine Morrow. Date of Service: 01/15/2020 1:30 PM Medical Record Number: [id number] Patient Account Number: [id number] Date of Birth/Sex: 10-25-1945 (74 y.o. F) Treating RN: Dolan Amen Primary Care Tosha Belgarde: Emily Filbert Other Clinician: Referring Odas Ozer: Emily Filbert Treating Edison Nicholson/Extender: Tito Dine in Treatment: 1 Visit Information History Since Last Visit Pain Present Now: Yes Patient Arrived: Wheel Chair Arrival Time: 13:49 Accompanied By: husband Transfer Assistance: None Patient Identification Verified: Yes Secondary Verification Process Completed: Yes Electronic Signature(s) Signed: 01/15/2020 5:15:03 PM By: Georges Mouse, Minus Breeding RN Entered By: Georges Mouse, Minus Breeding on 01/15/2020 13:50:03 Jasmine Morrow ([id number]) -------------------------------------------------------------------------------- Encounter Discharge Information Details Patient Name: Jasmine Morrow, Jasmine Morrow. Date of Service: 01/15/2020 1:30 PM Medical Record Number: [id number] Patient Account Number: [id number] Date of Birth/Sex: 01-May-1945 (74 y.o. F) Treating RN: Cornell Barman Primary Care Damarys Speir: Emily Filbert Other Clinician: Referring Keshon Markovitz: Emily Filbert Treating Babatunde Seago/Extender: Tito Dine in Treatment: 1 Encounter Discharge Information Items Discharge Condition: Stable Ambulatory Status: Ambulatory Discharge Destination: Home Transportation: Private Auto Accompanied By: self Schedule Follow-up Appointment: Yes Clinical Summary of Care: Electronic Signature(s) Signed: 01/16/2020 5:16:15 PM By: Gretta Cool, BSN, RN, CWS, Kim RN, BSN Entered By: Gretta Cool, BSN, RN, CWS, Kim on 01/15/2020 14:28:04 Jasmine Morrow ([id number]) -------------------------------------------------------------------------------- Lower Extremity Assessment Details Patient Name: Jasmine Morrow, Jasmine Morrow. Date of Service: 01/15/2020 1:30 PM Medical Record Number: [id number] Patient Account Number: [id number] Date of Birth/Sex: 11/27/1945 (74 y.o. F) Treating RN: Dolan Amen Primary Care Kentravious Lipford: Emily Filbert Other Clinician: Referring Trena Dunavan: Emily Filbert Treating Basem Yannuzzi/Extender: Tito Dine in Treatment: 1 Edema Assessment Assessed: [Left: No] [Right: Yes] Edema: [Left: Ye] [Right: s] Calf Left: Right: Point of Measurement: 26 cm From Medial Instep 37 cm Ankle Left: Right: Point of Measurement: 10 cm From Medial Instep 26.5 cm Electronic Signature(s) Signed: 01/15/2020 5:15:03 PM By: Georges Mouse, Minus Breeding RN Entered By: Georges Mouse, Minus Breeding on 01/15/2020 14:12:13 Jasmine Morrow ([id number]) -------------------------------------------------------------------------------- Multi Wound Chart Details Patient Name: Jasmine Morrow. Date of Service: 01/15/2020 1:30 PM Medical Record Number: [id number] Patient Account Number: [id number] Date of Birth/Sex: 05/07/1945 (74 y.o. F) Treating RN: Cornell Barman Primary Care Jearld Hemp: Emily Filbert Other Clinician: Referring Jarmal Lewelling: Emily Filbert Treating Bentlee Benningfield/Extender: Tito Dine in Treatment: 1 Vital Signs Height(in): 68 Pulse(bpm): 98 Weight(lbs): 200 Blood Pressure(mmHg): 94/52 Body Mass Index(BMI): 30 Temperature(F): 97.5 Respiratory Rate(breaths/min): 18 Photos: [N/A:N/A] Wound Location: Right Lower Leg N/A N/A Wounding Event: Surgical Injury N/A N/A Primary Etiology: Open Surgical Wound N/A N/A Comorbid History: Hypertension, Osteoarthritis N/A N/A Date Acquired: 02/01/2019 N/A N/A Weeks of Treatment: 1 N/A N/A Wound Status: Open N/A N/A Measurements L x W x D (cm) 1x1.1x1.2 N/A N/A Area (cm) : 0.864 N/A N/A Volume (cm) : 1.037 N/A N/A % Reduction in Area: 16.70% N/A N/A % Reduction in Volume: 16.60% N/A N/A Classification: Full Thickness Without Exposed N/A N/A Support  Structures Exudate Amount: Large N/A N/A Exudate Type: Serosanguineous N/A N/A Exudate Color: red, brown N/A N/A Granulation Amount: Medium (34-66%) N/A N/A Granulation Quality: Red N/A N/A Necrotic Amount: Small (1-33%) N/A N/A Exposed Structures: Fat Layer (Subcutaneous Tissue): N/A N/A Yes Fascia: No Tendon: No Muscle: No Joint: No Bone: No Epithelialization: None N/A N/A Procedures Performed: CHEM CAUT GRANULATION TISS N/A N/A Treatment Notes Wound #1 (Right Lower Leg) Notes Silver cell, xsorb, 3 layer right Electronic Signature(s) Jasmine Morrow, Jasmine Morrow ([id number]) Signed: 01/15/2020 4:29:45 PM By: Linton Ham MD  Entered By: Linton Ham on 01/15/2020 14:46:45 Jasmine Morrow ([id number]) -------------------------------------------------------------------------------- Ballplay Details Patient Name: Jasmine Morrow, Jasmine Morrow. Date of Service: 01/15/2020 1:30 PM Medical Record Number: [id number] Patient Account Number: [id number] Date of Birth/Sex: August 14, 1945 (74 y.o. F) Treating RN: Cornell Barman Primary Care Tilia Faso: Emily Filbert Other Clinician: Referring Olen Eaves: Emily Filbert Treating Markiya Keefe/Extender: Tito Dine in Treatment: 1 Active Inactive Necrotic Tissue Nursing Diagnoses: Impaired tissue integrity related to necrotic/devitalized tissue Goals: Necrotic/devitalized tissue will be minimized in the wound bed Date Initiated: 01/08/2020 Target Resolution Date: 02/06/2020 Goal Status: Active Interventions: Assess patient pain level pre-, during and post procedure and prior to discharge Treatment Activities: Apply topical anesthetic as ordered : 01/08/2020 Notes: Osteomyelitis Nursing Diagnoses: Potential for infection: osteomyelitis Goals: Signs and symptoms for osteomyelitis will be recognized and promptly addressed Date Initiated: 01/08/2020 Target Resolution Date: 02/06/2020 Goal Status: Active Interventions: Assess for signs and  symptoms of osteomyelitis resolution every visit Notes: Wound/Skin Impairment Nursing Diagnoses: Impaired tissue integrity Goals: Patient/caregiver will verbalize understanding of skin care regimen Date Initiated: 01/08/2020 Target Resolution Date: 02/06/2020 Goal Status: Active Ulcer/skin breakdown will have a volume reduction of 30% by week 4 Date Initiated: 01/08/2020 Target Resolution Date: 02/08/2020 Goal Status: Active Interventions: Assess ulceration(s) every visit Treatment Activities: Patient referred to home care : 01/08/2020 Notes: Jasmine Morrow, Jasmine Morrow ([id number]) Electronic Signature(s) Signed: 01/16/2020 5:16:15 PM By: Gretta Cool, BSN, RN, CWS, Kim RN, BSN Entered By: Gretta Cool, BSN, RN, CWS, Kim on 01/15/2020 14:20:47 Jasmine Morrow ([id number]) -------------------------------------------------------------------------------- Pain Assessment Details Patient Name: Jasmine Morrow, Jasmine Morrow. Date of Service: 01/15/2020 1:30 PM Medical Record Number: [id number] Patient Account Number: [id number] Date of Birth/Sex: [birth year] (74 y.o. F) Treating RN: Dolan Amen Primary Care Macy Lingenfelter: Emily Filbert Other Clinician: Referring Bond Grieshop: Emily Filbert Treating Breylin Dom/Extender: Tito Dine in Treatment: 1 Active Problems Location of Pain Severity and Description of Pain Patient Has Paino Yes Site Locations Pain Location: Pain in Ulcers Rate the pain. Current Pain Level: 8 Pain Management and Medication Current Pain Management: Electronic Signature(s) Signed: 01/15/2020 5:15:03 PM By: Georges Mouse, Minus Breeding RN Entered By: Georges Mouse, Kenia on 01/15/2020 14:01:51 Jasmine Morrow ([id number]) -------------------------------------------------------------------------------- Patient/Caregiver Education Details Patient Name: Jasmine Morrow. Date of Service: 01/15/2020 1:30 PM Medical Record Number: [id number] Patient Account Number: [id number] Date of Birth/Gender:  04-Apr-1945 (74 y.o. F) Treating RN: Cornell Barman Primary Care Physician: Emily Filbert Other Clinician: Referring Physician: Emily Filbert Treating Physician/Extender: Tito Dine in Treatment: 1 Education Assessment Education Provided To: Patient Education Topics Provided Wound/Skin Impairment: Handouts: Caring for Your Ulcer Methods: Demonstration, Explain/Verbal Responses: State content correctly Electronic Signature(s) Signed: 01/16/2020 5:16:15 PM By: Gretta Cool, BSN, RN, CWS, Kim RN, BSN Entered By: Gretta Cool, BSN, RN, CWS, Kim on 01/15/2020 14:27:13 Jasmine Morrow ([id number]) -------------------------------------------------------------------------------- Wound Assessment Details Patient Name: Jasmine Morrow, Jasmine Morrow. Date of Service: 01/15/2020 1:30 PM Medical Record Number: [id number] Patient Account Number: [id number] Date of Birth/Sex: 1945/08/07 (74 y.o. F) Treating RN: Cornell Barman Primary Care Sherle Mello: Emily Filbert Other Clinician: Referring Alysson Geist: Emily Filbert Treating Bianca Vester/Extender: Tito Dine in Treatment: 1 Wound Status Wound Number: 1 Primary Etiology: Open Surgical Wound Wound Location: Right Lower Leg Wound Status: Open Wounding Event: Surgical Injury Comorbid History: Hypertension, Osteoarthritis Date Acquired: 02/01/2019 Weeks Of Treatment: 1 Clustered Wound: No Photos Wound Measurements Length: (cm) 1 Width: (cm) 1.1 Depth: (cm) 1.2 Area: (cm) 0.864 Volume: (cm) 1.037 % Reduction in Area: 16.7% % Reduction in Volume: 16.6% Epithelialization: None Tunneling: No  Undermining: No Wound Description Classification: Full Thickness Without Exposed Support Struc Exudate Amount: Large Exudate Type: Serosanguineous Exudate Color: red, brown tures Foul Odor After Cleansing: No Slough/Fibrino Yes Wound Bed Granulation Amount: Medium (34-66%) Exposed Structure Granulation Quality: Red Fascia Exposed: No Necrotic Amount: Small  (1-33%) Fat Layer (Subcutaneous Tissue) Exposed: Yes Necrotic Quality: Adherent Slough Tendon Exposed: No Muscle Exposed: No Joint Exposed: No Bone Exposed: No Treatment Notes Wound #1 (Right Lower Leg) Notes Silver cell, xsorb, 3 layer right Electronic Signature(s) Signed: 01/16/2020 5:16:15 PM By: Gretta Cool, BSN, RN, CWS, Kim RN, BSN [address] [place]. ([id number]) Entered By: Gretta Cool, BSN, RN, CWS, Kim on 01/15/2020 14:19:56 Jasmine Morrow ([id number]) -------------------------------------------------------------------------------- Vitals Details Patient Name: Jasmine Morrow. Date of Service: 01/15/2020 1:30 PM Medical Record Number: [id number] Patient Account Number: [id number] Date of Birth/Sex: Oct 05, 1945 (74 y.o. F) Treating RN: Dolan Amen Primary Care Darnise Montag: Emily Filbert Other Clinician: Referring Daksh Coates: Emily Filbert Treating Dannika Hilgeman/Extender: Tito Dine in Treatment: 1 Vital Signs Time Taken: 13:50 Temperature (F): 97.5 Height (in): 68 Pulse (bpm): 98 Weight (lbs): 200 Respiratory Rate (breaths/min): 18 Body Mass Index (BMI): 30.4 Blood Pressure (mmHg): 94/52 Reference Range: 80 - 120 mg / dl Electronic Signature(s) Signed: 01/15/2020 5:15:03 PM By: Georges Mouse, Minus Breeding RN Entered By: Georges Mouse, Minus Breeding on 01/15/2020 14:01:31

## 2020-01-17 NOTE — Progress Notes (Signed)
MARSHAE, AZAM ([id number]) Visit Report for 01/15/2020 HPI Details Patient Name: Jasmine Morrow, Jasmine Morrow. Date of Service: 01/15/2020 1:30 PM Medical Record Number: [id number] Patient Account Number: [id number] Date of Birth/Sex: [birth year] (74 y.o. F) Treating RN: Cornell Barman Primary Care Provider: Emily Filbert Other Clinician: Referring Provider: Emily Filbert Treating Provider/Extender: Tito Dine in Treatment: 1 History of Present Illness HPI Description: ADMISSION 01/08/2020 This is a 74 year old woman who is not a diabetic. She had a right ankle trimalleolar fracture that she suffered in early January. I believe there were fracture blisters or skin blisters so this had a delayed surgical repair. In the meantime she was admitted to hospital from 02/20/2019 through 03/07/2019 with a diverticular bleed and MSSA sepsis. TEE was negative. It was felt her surgical ankle site was the source although it seems that this underwent an ORIF on 03/14/2019. She was given a 23-day course of cefazolin post discharge and I believe she was sent to a skilled facility in [place]. She developed right ankle swelling in August oral antibiotics were again given. There is a description of the wound relating to a call from [facility] on 10[facility]03[facility]2019. Dr. Rudene Christians took her back to the OR on 11/12/2019 I think for removal loath to localize screws. An operative culture again grew MSSA she was given a 7-day course of Keflex. She has been followed by orthopedics according to her husband she has a protruding area of hyper granular tissue that they have been shaving down but it keeps on returning. She was referred here for review of this difficult wound. 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 We could not do an ABI in this clinic because of discomfort when we started to inflate the cuff 01/15/2020; this is a medically complex woman I admitted to the clinic last week. She has a small draining wound on the right medial ankle in the setting of a complex ORIF in February of this year. This was for a trimalleolar fracture. Subsequent to this she developed MSSA sepsis and was critically ill. She had a course of IV antibiotics and then a 23-day course of cefazolin. She was taken back to the OR by Dr. Rudene Christians of orthopedic surgery on 11/12/2019 and I think he did some local hardware removal. Intraoperative culture grew again MSSA and she received a 7-day course of Keflex. She is allergic to quinolones and sulfonamides. The patient complains of difficulty with putting weight here there is pain in the ankle. She also has pain in both knees. I think she is minimally weightbearing/ambulatory. She is not obviously systemically unwell Culture of the drainage last week again grew MSSA. This is resistant to clindamycin. The patient tells me that cephalexin/Keflex gives her extreme nausea and yeast infections in her buttocks and periarea. Electronic Signature(s) Signed: 01/15/2020 4:29:45 PM By: Linton Ham MD Entered By: Linton Ham on 01/15/2020 14:50:31 Jasmine Morrow ([id number]) -------------------------------------------------------------------------------- [facility] Details Patient Name: Jasmine Morrow, Jasmine Morrow. Date of Service: 01/15/2020 1:30 PM Medical Record Number: [id number] Patient Account Number: [id number] Date of Birth/Sex: [birth year] (74 y.o. F) Treating RN: Cornell Barman Primary Care Provider: Emily Filbert Other Clinician: Referring Provider: Emily Filbert Treating Provider/Extender: Tito Dine in Treatment: 1 Procedure Performed for: Wound #1 Right Lower Leg Performed By: Physician  Ricard Dillon, MD Post Procedure Diagnosis Same as Pre-procedure Notes 2 silver nitrate sticks used Electronic Signature(s) Signed: 01/16/2020 5:16:15 PM By: Gretta Cool, BSN, RN, CWS, Kim RN, BSN Entered By: Gretta Cool, BSN, RN, CWS, Kim on 01/15/2020 14:26:38 Jasmine Morrow ([id number]) -------------------------------------------------------------------------------- Physical Exam Details Patient Name: Jasmine Morrow, Jasmine Morrow. Date of Service: 01/15/2020 1:30 PM Medical Record Number: [id number] Patient Account Number: [id number] Date of Birth/Sex: February 22, 1945 (74 y.o. F) Treating RN: Cornell Barman Primary Care Provider: Emily Filbert Other Clinician: Referring Provider: Emily Filbert Treating Provider/Extender: Tito Dine in Treatment: 1 Constitutional Patient is hypotensive.. Pulse regular and within target range for patient.Marland Kitchen Respirations regular, non-labored and within target range.. Temperature is normal and within the target range for the patient.. Patient looks somewhat pale. Respiratory Respiratory effort is easy and symmetric bilaterally. Rate is normal at rest and on room air.. Cardiovascular Pedal pulses are palpable. Integumentary (Hair, Skin) No obvious ankle effusion on the right. No erythema or swelling around the wound. Psychiatric Patient appears depressed today.. Notes Wound exam; the patient has a small area on the right medial ankle. Protruding granular tissue that weeps. This easily probes to either hardware or bone. Electronic Signature(s) Signed: 01/15/2020 4:29:45 PM By: Linton Ham MD Entered By: Linton Ham on 01/15/2020 14:52:24 Jasmine Morrow ([id number]) -------------------------------------------------------------------------------- Physician Orders Details Patient Name: Jasmine Morrow, Jasmine Morrow. Date of Service: 01/15/2020 1:30 PM Medical Record Number: [id number] Patient Account Number: [id number] Date of Birth/Sex: [birth year] (74 y.o. F) Treating  RN: Cornell Barman Primary Care Provider: Emily Filbert Other Clinician: Referring Provider: Emily Filbert Treating Provider/Extender: Tito Dine in Treatment: 1 Verbal / Phone Orders: No Diagnosis Coding Wound Cleansing Wound #1 Right Lower Leg o Antibacterial soap, wash wounds, rinse and pat dry prior to dressing wounds Anesthetic (add to Medication List) Wound #1 Right Lower Leg o Topical Lidocaine 4% cream applied to wound bed prior to debridement (In Clinic Only). Primary Wound Dressing Wound #1 Right Lower Leg o Silver Alginate Secondary Dressing Wound #1 Right Lower Leg o XtraSorb Dressing Change Frequency Wound #1 Right Lower Leg o Change Dressing Monday, Wednesday, Friday Follow-up Appointments Wound #1 Right Lower Leg o Return Appointment in 1 week. Edema Control Wound #1 Right Lower Leg o 3 Layer Compression System - Left Lower Extremity o Elevate legs to the level of the heart and pump ankles as often as possible Additional Orders / Instructions Wound #1 Right Lower Leg o Increase protein intake. Home Health Wound #1 Right Lower Leg o [place] Visits - [place] Nurse may visit PRN to address patientos wound care needs. o *FACE TO FACE ENCOUNTER: MEDICARE and MEDICAID PATIENTS: I certify that this patient is under my care and that I had a face-to-face encounter that meets the physician face-to-face encounter requirements with this patient on this date. The encounter with the patient was in whole or in part for the following MEDICAL CONDITION: (primary reason for [place]) MEDICAL NECESSITY: I certify, that based on my findings, NURSING services are a medically necessary home health service. *HOME BOUND STATUS: I certify that my clinical findings support that this patient is homebound (i.e., Due to illness or injury, pt requires aid of supportive devices such as crutches, cane, wheelchairs,  walkers, the use of special transportation or the assistance of another person to leave their place of residence. There is a normal inability to leave the home and doing so requires considerable and taxing effort. Other absences are  for medical reasons / religious services and are infrequent or of short duration when for other reasons). *Please direct any NON-WOUND related issues/requests for orders to patient's Primary Care Physician. *If current dressing causes regression in wound condition, may D/C ordered dressing product/s and apply Normal Saline Moist Dressing daily until next [place] / Other MD appointment. [place] of regression in wound condition at [phone]. o Please direct any NON-WOUND related issues/requests for orders to patient's Primary Care Physician Jasmine Morrow, Jasmine Morrow ([id number]) Patient Medications Allergies: morphine, sulfur, Cipro Notifications Medication Indication Start End doxycycline monohydrate MSSA wound 01/15/2020 infection DOSE oral 100 mg capsule - 1 capsule oral bid for 14 days Electronic Signature(s) Signed: 01/15/2020 2:40:16 PM By: Linton Ham MD Entered By: Linton Ham on 01/15/2020 14:40:15 Jasmine Morrow ([id number]) -------------------------------------------------------------------------------- Problem List Details Patient Name: Jasmine Morrow, Jasmine Morrow. Date of Service: 01/15/2020 1:30 PM Medical Record Number: [id number] Patient Account Number: [id number] Date of Birth/Sex: 16-Jul-1945 (74 y.o. 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 Date of Service: 01/15/2020 1:30 PM Medical Record Number: [id number] Patient Account Number: [id number] Date of Birth/Sex: 04-Feb-1945 (74 y.o. F) Treating RN: Cornell Barman Primary Care Provider: Emily Filbert Other Clinician: Referring Provider: Emily Filbert Treating Provider/Extender: Tito Dine in Treatment: 1 Subjective History of Present Illness (HPI) ADMISSION 01/08/2020 This is a 74 year old woman who is not a diabetic. She had a right ankle trimalleolar fracture that she suffered in early January. I believe there were fracture blisters or skin blisters so this had a delayed surgical repair. In the meantime she was admitted to hospital from 02/20/2019 through 03/07/2019 with a diverticular bleed and MSSA sepsis. TEE was negative. It was felt her surgical ankle site was the source although it seems that this underwent an ORIF on 03/14/2019. She was given a 23-day course of cefazolin post discharge and I believe she was sent to a skilled facility in [place]. She developed right ankle swelling in August oral antibiotics were again given. There is a description of the wound relating to a call from [facility] on 10/03/2019. Dr. Rudene Christians took her back to the OR on 11/12/2019 I think for removal loath to localize screws. An operative culture again grew MSSA she was given a 7-day course of Keflex. She has been followed by orthopedics according to her husband she has a  protruding area of hyper granular tissue that they have been shaving down but it keeps on returning. She was referred here for review of this difficult wound. 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 We could not do an ABI in this clinic because of discomfort when we started to inflate  the cuff 01/15/2020; this is a medically complex woman I admitted to the clinic last week. She has a small draining wound on the right medial ankle in the setting of a complex ORIF in February of this year. This was for a trimalleolar fracture. Subsequent to this she developed MSSA sepsis and was critically ill. She had a course of IV antibiotics and then a 23-day course of cefazolin. She was taken back to the OR by Dr. Rudene Christians of orthopedic surgery on 11/12/2019 and I think he did some local hardware removal. Intraoperative culture grew again MSSA and she received a 7-day course of Keflex. She is allergic to quinolones and sulfonamides. The patient complains of difficulty with putting weight here there is pain in the ankle. She also has pain in both knees. I think she is minimally weightbearing/ambulatory. She is not obviously systemically unwell Culture of the drainage last week again grew MSSA. This is resistant to clindamycin. The patient tells me that cephalexin/Keflex gives her extreme nausea and yeast infections in her buttocks and periarea. Objective Constitutional Patient is hypotensive.. Pulse regular and within target range for patient.Marland Kitchen Respirations regular, non-labored and within target range.. Temperature is normal and within the target range for the patient.. Patient looks somewhat pale. Vitals Time Taken: 1:50 PM, Height: 68  in, Weight: 200 lbs, BMI: 30.4, Temperature: 97.5 F, Pulse: 98 bpm, Respiratory Rate: 18 breaths/min, Blood Pressure: 94/52 mmHg. Respiratory Respiratory effort is easy and symmetric bilaterally. Rate is normal at rest and on room air.. Cardiovascular Pedal pulses are palpable. Psychiatric Patient appears depressed today.. General Notes: Wound exam; the patient has a small area on the right medial ankle. Protruding granular tissue that weeps. This easily probes to either hardware or bone. Jasmine Morrow, Jasmine R. ([id number]) Integumentary (Hair, Skin) No obvious ankle effusion on the right. No erythema or swelling around the wound. Wound #1 status is Open. Original cause of wound was Surgical Injury. The wound is located on the Right Lower Leg. The wound measures 1cm length x 1.1cm width x 1.2cm depth; 0.864cm^2 area and 1.037cm^3 volume. There is Fat Layer (Subcutaneous Tissue) exposed. There is no tunneling or undermining noted. There is a large amount of serosanguineous drainage noted. There is medium (34-66%) red granulation within the wound bed. There is a small (1-33%) amount of necrotic tissue within the wound bed including Adherent Slough. 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 An CHEM CAUT GRANULATION TISS procedure was performed by Ricard Dillon, MD. Post procedure Diagnosis Wound #1: Same as Pre-Procedure Notes: 2 silver nitrate sticks used Plan Wound Cleansing: Wound #1 Right Lower Leg: Antibacterial soap, wash wounds, rinse and pat dry prior to dressing wounds Anesthetic (add to Medication  List): Wound #1 Right Lower Leg: Topical Lidocaine 4% cream applied to wound bed prior to debridement (In Clinic Only). Primary Wound Dressing: Wound #1 Right Lower Leg: Silver Alginate Secondary Dressing: Wound #1 Right Lower Leg: XtraSorb Dressing Change Frequency: Wound #1 Right Lower Leg: Change Dressing Monday, Wednesday, Friday Follow-up Appointments: Wound #1 Right Lower Leg: Return Appointment in 1 week. Edema Control: Wound #1 Right Lower Leg: 3 Layer Compression System - Left Lower Extremity Elevate legs to the level of the heart  and pump ankles as often as possible Additional Orders / Instructions: Wound #1 Right Lower Leg: Increase protein intake. Home Health: Wound #1 Right Lower Leg: Continue Home Health Visits - [place] Nurse may visit PRN to address patient s wound care needs. *FACE TO FACE ENCOUNTER: MEDICARE and MEDICAID PATIENTS: I certify that this patient is under my care and that I had a face-to-face encounter that meets the physician face-to-face encounter requirements with this patient on this date. The encounter with the patient was in whole or in part for the following MEDICAL CONDITION: (primary reason for [place]) MEDICAL NECESSITY: I certify, that based on my findings, NURSING services are a medically necessary home health service. *HOME BOUND STATUS: I certify that my clinical findings support that this patient is homebound (i.e., Due to illness or injury, pt requires aid of supportive devices such as crutches, cane, wheelchairs, walkers, the use of special transportation or the assistance of another person to leave their place of residence. There is a normal inability to leave the home and doing so requires considerable and taxing effort. Other absences are for medical reasons / religious services and are infrequent or of short Jasmine Morrow, Jasmine Morrow. ([id number]) duration when for other reasons). *Please direct any NON-WOUND  related issues/requests for orders to patient's Primary Care Physician. *If current dressing causes regression in wound condition, may D/C ordered dressing product/s and apply Normal Saline Moist Dressing daily until next [place] / Other MD appointment. [place] of regression in wound condition at [phone]. Please direct any NON-WOUND related issues/requests for orders to patient's Primary Care Physician The following medication(s) was prescribed: doxycycline monohydrate oral 100 mg capsule 1 capsule oral bid for 14 days for MSSA wound infection starting 01/15/2020 1. 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 2. I do not think there is a way to eradicate this with medical therapy. I think she would need an extensive surgery to remove the hardware however I am not sure that would leave her ambulatory I am not even sure she can get through the surgery. 3. I explained the best I can I I think this wound is actually an exit site for infection rather than a wound that will heal. It looked exactly the same as last week 4. I used silver nitrate on this we are using silver alginate under compression with 3 layers. 5. I am going to give her a prolonged course of doxycycline perhaps for a month and see how this does. They seem reluctant to go back to orthopedics which is what I told them they might need to do. Of course the option to hardware removal would be an amputation. Her husband thought that that would simply "kill her". Unfortunately I think it ultimately might come to this Electronic Signature(s) Signed: 01/15/2020 4:29:45 PM By: Linton Ham MD Entered By: Linton Ham on 01/15/2020 14:55:17 Jasmine Morrow ([id number]) -------------------------------------------------------------------------------- [place] Details Patient Name: Jasmine Morrow. Date of Service: 01/15/2020 Medical Record  Number: [id number] Patient Account Number: [id number] Date of Birth/Sex: Sep 04, 1945 (74 y.o. 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 B95.61 Methicillin susceptible Staphylococcus aureus infection as the cause  of diseases classified elsewhere Facility Procedures CPT4 Code: 16073710 Description: 62694 - CHEM CAUT GRANULATION TISS Modifier: Quantity: 1 CPT4 Code: Description: ICD-10 Diagnosis Description 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

## 2020-01-17 NOTE — Progress Notes (Signed)
Jasmine, Morrow ([id number]) Visit Report for 01/08/2020 Allergy List Details Patient Name: Jasmine Morrow, Jasmine Morrow. Date of Service: 01/08/2020 12:45 PM Medical Record Number: [id number] Patient Account Number: [id number] Date of Birth/Sex: 1945-05-09 (74 y.o. F) Treating RN: Dolan Amen Primary Care Tristian Sickinger: Emily Filbert Other Clinician: Referring Kinneth Fujiwara: Emily Filbert Treating Analena Gama/Extender: Ricard Dillon Weeks in Treatment: 0 Allergies Active Allergies morphine Reaction: itching sulfur Cipro Allergy Notes Electronic Signature(s) Signed: 01/09/2020 4:55:46 PM By: Georges Mouse, Minus Breeding Entered By: Georges Mouse, Minus Breeding on 01/08/2020 13:26:34 Yates Decamp ([id number]) -------------------------------------------------------------------------------- Arrival Information Details Patient Name: Jasmine, Morrow. Date of Service: 01/08/2020 12:45 PM Medical Record Number: [id number] Patient Account Number: [id number] Date of Birth/Sex: Oct 22, 1945 (74 y.o. F) Treating RN: Dolan Amen Primary Care Raynald Rouillard: Emily Filbert Other Clinician: Referring Jessilynn Taft: Emily Filbert Treating Sunita Demond/Extender: Tito Dine in Treatment: 0 Visit Information Patient Arrived: Wheel Chair Arrival Time: 13:23 Accompanied By: husband Transfer Assistance: None Patient Identification Verified: Yes Secondary Verification Process Completed: Yes Electronic Signature(s) Signed: 01/09/2020 4:55:46 PM By: Georges Mouse, Minus Breeding Entered By: Georges Mouse, Minus Breeding on 01/08/2020 13:43:43 Yates Decamp ([id number]) -------------------------------------------------------------------------------- Clinic Level of Care Assessment Details Patient Name: Jasmine, Morrow. Date of Service: 01/08/2020 12:45 PM Medical Record Number: [id number] Patient Account Number: [id number] Date of Birth/Sex: 1945/08/26 (74 y.o. F) Treating RN: Cornell Barman Primary Care Makenleigh Crownover: Emily Filbert Other  Clinician: Referring Brytney Somes: Emily Filbert Treating Kandy Towery/Extender: Tito Dine in Treatment: 0 Clinic Level of Care Assessment Items TOOL 1 Quantity Score []  - Use when EandM and Procedure is performed on INITIAL visit 0 ASSESSMENTS - Nursing Assessment / Reassessment X - General Physical Exam (combine w/ comprehensive assessment (listed just below) when performed on new 1 20 pt. evals) X- 1 25 Comprehensive Assessment (HX, ROS, Risk Assessments, Wounds Hx, etc.) ASSESSMENTS - Wound and Skin Assessment / Reassessment []  - Dermatologic / Skin Assessment (not related to wound area) 0 ASSESSMENTS - Ostomy and/or Continence Assessment and Care []  - Incontinence Assessment and Management 0 []  - 0 Ostomy Care Assessment and Management (repouching, etc.) PROCESS - Coordination of Care X - Simple Patient / Family Education for ongoing care 1 15 []  - 0 Complex (extensive) Patient / Family Education for ongoing care X- 1 10 Staff obtains Programmer, systems, Records, Test Results / Process Orders []  - 0 Staff telephones HHA, Nursing Homes / Clarify orders / etc []  - 0 Routine Transfer to another Facility (non-emergent condition) []  - 0 Routine Hospital Admission (non-emergent condition) X- 1 15 New Admissions / Biomedical engineer / Ordering NPWT, Apligraf, etc. []  - 0 Emergency Hospital Admission (emergent condition) PROCESS - Special Needs []  - Pediatric / Minor Patient Management 0 []  - 0 Isolation Patient Management []  - 0 Hearing / Language / Visual special needs []  - 0 Assessment of Community assistance (transportation, D/C planning, etc.) []  - 0 Additional assistance / Altered mentation []  - 0 Support Surface(s) Assessment (bed, cushion, seat, etc.) INTERVENTIONS - Miscellaneous []  - External ear exam 0 X- 1 10 Patient Transfer (multiple staff / Civil Service fast streamer / Similar devices) []  - 0 Simple Staple / Suture removal (25 or less) []  - 0 Complex Staple / Suture  removal (26 or more) []  - 0 Hypo/Hyperglycemic Management (do not check if billed separately) X- 1 15 Ankle / Brachial Index (ABI) - do not check if billed separately Has the patient been seen at the hospital within the last three years: Yes Total Score: 110 Level Of Care: New/Established -  Level 3 NAKENYA, THEALL ([id number]) Electronic Signature(s) Signed: 01/16/2020 5:16:53 PM By: Gretta Cool, BSN, RN, CWS, Kim RN, BSN Entered By: Gretta Cool, BSN, RN, CWS, Kim on 01/08/2020 14:32:24 Yates Decamp ([id number]) -------------------------------------------------------------------------------- Encounter Discharge Information Details Patient Name: Jasmine, Morrow. Date of Service: 01/08/2020 12:45 PM Medical Record Number: [id number] Patient Account Number: [id number] Date of Birth/Sex: May 31, 1945 (74 y.o. F) Treating RN: Cornell Barman Primary Care Amely Voorheis: Emily Filbert Other Clinician: Referring Wei Newbrough: Emily Filbert Treating Demarcus Thielke/Extender: Tito Dine in Treatment: 0 Encounter Discharge Information Items Post Procedure Vitals Discharge Condition: Stable Unable to obtain vitals Reason: . Ambulatory Status: Wheelchair Discharge Destination: Home Transportation: Private Auto Accompanied By: husband Schedule Follow-up Appointment: Yes Clinical Summary of Care: Electronic Signature(s) Signed: 01/16/2020 5:16:53 PM By: Gretta Cool, BSN, RN, CWS, Kim RN, BSN Entered By: Gretta Cool, BSN, RN, CWS, Kim on 01/08/2020 Jasmine, Morrow ([id number]) -------------------------------------------------------------------------------- Lower Extremity Assessment Details Patient Name: Jasmine, Morrow. Date of Service: 01/08/2020 12:45 PM Medical Record Number: [id number] Patient Account Number: [id number] Date of Birth/Sex: [birth year] (74 y.o. F) Treating RN: Dolan Amen Primary Care Zanasia Hickson: Emily Filbert Other Clinician: Referring Tywanna Seifer: Emily Filbert Treating Addalynn Kumari/Extender:  Tito Dine in Treatment: 0 Edema Assessment Assessed: [Left: No] [Right: Yes] Edema: [Left: Ye] [Right: s] Calf Left: Right: Point of Measurement: 26 cm From Medial Instep 43 cm Ankle Left: Right: Point of Measurement: 10 cm From Medial Instep 32 cm Notes patient did not tolerate ABI Electronic Signature(s) Signed: 01/09/2020 4:55:46 PM By: Georges Mouse, Minus Breeding Entered By: Georges Mouse, Minus Breeding on 01/08/2020 13:48:27 Yates Decamp ([id number]) -------------------------------------------------------------------------------- Multi Wound Chart Details Patient Name: Yates Decamp. Date of Service: 01/08/2020 12:45 PM Medical Record Number: [id number] Patient Account Number: [id number] Date of Birth/Sex: 01-05-1946 (74 y.o. F) Treating RN: Cornell Barman Primary Care Carena Stream: Emily Filbert Other Clinician: Referring Danell Vazquez: Emily Filbert Treating Mazzy Santarelli/Extender: Tito Dine in Treatment: 0 Vital Signs Height(in): 68 Pulse(bpm): 105 Weight(lbs): 200 Blood Pressure(mmHg): 110/55 Body Mass Index(BMI): 30 Temperature(F): 97.6 Respiratory Rate(breaths/min): 16 Photos: [N/A:N/A] Wound Location: Right Lower Leg N/A N/A Wounding Event: Surgical Injury N/A N/A Primary Etiology: Open Surgical Wound N/A N/A Comorbid History: Hypertension, Osteoarthritis N/A N/A Date Acquired: 02/01/2019 N/A N/A Weeks of Treatment: 0 N/A N/A Wound Status: Open N/A N/A Measurements L x W x D (cm) 1.1x1.2x0.1 N/A N/A Area (cm) : 1.037 N/A N/A Volume (cm) : 0.104 N/A N/A % Reduction in Area: 0.00% N/A N/A % Reduction in Volume: 0.00% N/A N/A Classification: Full Thickness Without Exposed N/A N/A Support Structures Exudate Amount: Large N/A N/A Exudate Type: Sanguinous N/A N/A Exudate Color: red N/A N/A Granulation Amount: Medium (34-66%) N/A N/A Granulation Quality: Red N/A N/A Necrotic Amount: Small (1-33%) N/A N/A Exposed Structures: Fat Layer (Subcutaneous  Tissue): N/A N/A Yes Fascia: No Tendon: No Muscle: No Joint: No Bone: No Epithelialization: None N/A N/A Treatment Notes Electronic Signature(s) Signed: 01/09/2020 7:46:45 PM By: Linton Ham MD Entered By: Linton Ham on 01/08/2020 14:15:23 Yates Decamp ([id number]) -------------------------------------------------------------------------------- [place] Details Patient Name: KALIMAH, CAPURRO. Date of Service: 01/08/2020 12:45 PM Medical Record Number: [id number] Patient Account Number: [id number] Date of Birth/Sex: 10-14-1945 (74 y.o. F) Treating RN: Cornell Barman Primary Care Ashlin Kreps: Emily Filbert Other Clinician: Referring Haide Klinker: Emily Filbert Treating Khasir Woodrome/Extender: Tito Dine in Treatment: 0 Active Inactive Necrotic Tissue Nursing Diagnoses: Impaired tissue integrity related to necrotic/devitalized tissue Goals: Necrotic/devitalized tissue will be minimized in the wound bed Date Initiated: 01/08/2020 Target  Resolution Date: 02/06/2020 Goal Status: Active Interventions: Assess patient pain level pre-, during and post procedure and prior to discharge Treatment Activities: Apply topical anesthetic as ordered : 01/08/2020 Notes: Orientation to the Wound Care Program Nursing Diagnoses: Knowledge deficit related to the wound healing center program Goals: Patient/caregiver will verbalize understanding of the [place] Program Date Initiated: 01/08/2020 Target Resolution Date: 02/06/2020 Goal Status: Active Interventions: Provide education on orientation to the wound center Notes: Osteomyelitis Nursing Diagnoses: Potential for infection: osteomyelitis Goals: Signs and symptoms for osteomyelitis will be recognized and promptly addressed Date Initiated: 01/08/2020 Target Resolution Date: 02/06/2020 Goal Status: Active Interventions: Assess for signs and symptoms of osteomyelitis resolution every  visit Notes: Wound/Skin Impairment Nursing Diagnoses: Impaired tissue integrity Goals: Patient/caregiver will verbalize understanding of skin care regimen LATACHA, TEXEIRA ([id number]) Date Initiated: 01/08/2020 Target Resolution Date: 02/06/2020 Goal Status: Active Ulcer/skin breakdown will have a volume reduction of 30% by week 4 Date Initiated: 01/08/2020 Target Resolution Date: 02/08/2020 Goal Status: Active Interventions: Assess ulceration(s) every visit Treatment Activities: Patient referred to home care : 01/08/2020 Notes: Electronic Signature(s) Signed: 01/16/2020 5:16:53 PM By: Gretta Cool, BSN, RN, CWS, Kim RN, BSN Entered By: Gretta Cool, BSN, RN, CWS, Kim on 01/08/2020 14:03:45 Yates Decamp ([id number]) -------------------------------------------------------------------------------- Pain Assessment Details Patient Name: MELIS, TROCHEZ. Date of Service: 01/08/2020 12:45 PM Medical Record Number: [id number] Patient Account Number: [id number] Date of Birth/Sex: July 19, 1945 (74 y.o. F) Treating RN: Dolan Amen Primary Care Keiera Strathman: Emily Filbert Other Clinician: Referring Carsin Randazzo: Emily Filbert Treating Cullen Vanallen/Extender: Tito Dine in Treatment: 0 Active Problems Location of Pain Severity and Description of Pain Patient Has Paino Yes Site Locations Pain Location: Generalized Pain Rate the pain. Current Pain Level: 7 Pain Management and Medication Current Pain Management: Electronic Signature(s) Signed: 01/09/2020 4:55:46 PM By: Georges Mouse, Minus Breeding Entered By: Georges Mouse, Kenia on 01/08/2020 13:44:17 Yates Decamp ([id number]) -------------------------------------------------------------------------------- Patient/Caregiver Education Details Patient Name: Yates Decamp. Date of Service: 01/08/2020 12:45 PM Medical Record Number: [id number] Patient Account Number: [id number] Date of Birth/Gender: [birth year] (74 y.o. F) Treating RN: Cornell Barman Primary Care Physician: Emily Filbert Other Clinician: Referring Physician: Emily Filbert Treating Physician/Extender: Tito Dine in Treatment: 0 Education Assessment Education Provided To: Patient Education Topics Provided Wound/Skin Impairment: Handouts: Caring for Your Ulcer Methods: Demonstration, Explain/Verbal Responses: State content correctly Electronic Signature(s) Signed: 01/16/2020 5:16:53 PM By: Gretta Cool, BSN, RN, CWS, Kim RN, BSN Entered By: Gretta Cool, BSN, RN, CWS, Kim on 01/08/2020 14:32:55 Yates Decamp ([id number]) -------------------------------------------------------------------------------- Wound Assessment Details Patient Name: SHAQUEENA, MAUCERI. Date of Service: 01/08/2020 12:45 PM Medical Record Number: [id number] Patient Account Number: [id number] Date of Birth/Sex: 09-21-1945 (74 y.o. F) Treating RN: Cornell Barman Primary Care Alecxander Mainwaring: Emily Filbert Other Clinician: Referring Tykia Mellone: Emily Filbert Treating Jaxyn Rout/Extender: Tito Dine in Treatment: 0 Wound Status Wound Number: 1 Primary Etiology: Open Surgical Wound Wound Location: Right Lower Leg Wound Status: Open Wounding Event: Surgical Injury Comorbid History: Hypertension, Osteoarthritis Date Acquired: 02/01/2019 Weeks Of Treatment: 0 Clustered Wound: No Photos Wound Measurements Length: (cm) 1.1 Width: (cm) 1.2 Depth: (cm) 1.2 Area: (cm) 1.037 Volume: (cm) 1.244 % Reduction in Area: 0% % Reduction in Volume: -1096.2% Epithelialization: None Tunneling: No Undermining: No Wound Description Classification: Full Thickness Without Exposed Support Structu Exudate Amount: Large Exudate Type: Sanguinous Exudate Color: red res Foul Odor After Cleansing: No Slough/Fibrino Yes Wound Bed Granulation Amount: Medium (34-66%) Exposed Structure Granulation Quality: Red Fascia Exposed: No Necrotic Amount: Small (1-33%) Fat  Layer (Subcutaneous Tissue) Exposed:  Yes Necrotic Quality: Adherent Slough Tendon Exposed: No Muscle Exposed: No Joint Exposed: No Bone Exposed: No Electronic Signature(s) Signed: 01/16/2020 5:16:53 PM By: Gretta Cool, BSN, RN, CWS, Kim RN, BSN Entered By: Gretta Cool, BSN, RN, CWS, Kim on 01/08/2020 14:30:17 Yates Decamp ([id number]) -------------------------------------------------------------------------------- [place] Details Patient Name: Yates Decamp. Date of Service: 01/08/2020 12:45 PM Medical Record Number: [id number] Patient Account Number: [id number] Date of Birth/Sex: 10/16/1945 (74 y.o. F) Treating RN: Dolan Amen Primary Care Carrigan Delafuente: Emily Filbert Other Clinician: Referring Courtny Bennison: Emily Filbert Treating Cadan Maggart/Extender: Tito Dine in Treatment: 0 Vital Signs Time Taken: 13:30 Temperature (F): 97.6 Height (in): 68 Pulse (bpm): 105 Source: Stated Respiratory Rate (breaths/min): 16 Weight (lbs): 200 Blood Pressure (mmHg): 110/55 Source: Stated Reference Range: 80 - 120 mg / dl Body Mass Index (BMI): 30.4 Electronic Signature(s) Signed: 01/09/2020 4:55:46 PM By: Georges Mouse, Minus Breeding Entered By: Georges Mouse, Minus Breeding on 01/08/2020 13:44:48

## 2020-01-17 NOTE — Progress Notes (Signed)
VICTORIANA, AZIZ ([id number]) Visit Report for 01/08/2020 Chief Complaint Document Details Patient Name: Jasmine Morrow, Jasmine Morrow. Date of Service: 01/08/2020 12:45 PM Medical Record Number: [id number] Patient Account Number: [id number] Date of Birth/Sex: 20-Jul-1945 (74 y.o. F) Treating RN: Cornell Barman Primary Care Provider: Emily Filbert Other Clinician: Referring Provider: Emily Filbert Treating Provider/Extender: Tito Dine in Treatment: 0 Information Obtained from: Patient Chief Complaint 01/08/2020; patient is here for review of a nonhealing wound on her right medial ankle in the setting of recent surgery Electronic Signature(s) Signed: 01/09/2020 7:46:45 PM By: Linton Ham MD Entered By: Linton Ham on 01/08/2020 14:16:06 Jasmine Morrow ([id number]) -------------------------------------------------------------------------------- Debridement Details Patient Name: Jasmine Morrow. Date of Service: 01/08/2020 12:45 PM Medical Record Number: [id number] Patient Account Number: [id number] Date of Birth/Sex: May 07, 1945 (74 y.o. F) Treating RN: Cornell Barman Primary Care Provider: Emily Filbert Other Clinician: Referring Provider: Emily Filbert Treating Provider/Extender: Tito Dine in Treatment: 0 Debridement Performed for Wound #1 Right Lower Leg Assessment: Performed By: Physician Ricard Dillon, MD Debridement Type: Debridement Level of Consciousness (Pre- Awake and Alert procedure): Pre-procedure Verification/Time Out Yes - 14:28 Taken: Pain Control: Lidocaine Total Area Debrided (L x W): 1.1 (cm) x 1.2 (cm) = 1.32 (cm) Tissue and other material Viable, Non-Viable, Subcutaneous debrided: Level: Skin/Subcutaneous Tissue Debridement Description: Excisional Instrument: Blade Bleeding: Large Hemostasis Achieved: Silver Nitrate Response to Treatment: Procedure was tolerated well Level of Consciousness (Post- Awake and Alert procedure): Post  Debridement Measurements of Total Wound Length: (cm) 1.1 Width: (cm) 1.2 Depth: (cm) 1.2 Volume: (cm) 1.244 Character of Wound/Ulcer Post Debridement: Stable Post Procedure Diagnosis Same as Pre-procedure Electronic Signature(s) Signed: 01/09/2020 7:46:45 PM By: Linton Ham MD Signed: 01/16/2020 5:16:53 PM By: Gretta Cool, BSN, RN, CWS, Kim RN, BSN Entered By: Gretta Cool, BSN, RN, CWS, Kim on 01/08/2020 14:30:05 Jasmine Morrow ([id number]) -------------------------------------------------------------------------------- HPI Details Patient Name: Jasmine Morrow, Jasmine Morrow. Date of Service: 01/08/2020 12:45 PM Medical Record Number: [id number] Patient Account Number: [id number] Date of Birth/Sex: 03/24/1945 (74 y.o. F) Treating RN: Cornell Barman Primary Care Provider: Emily Filbert Other Clinician: Referring Provider: Emily Filbert Treating Provider/Extender: Tito Dine in Treatment: 0 History of Present Illness HPI Description: ADMISSION 01/08/2020 This is a 74 year old woman who is not a diabetic. She had a right ankle trimalleolar fracture that she suffered in early January. I believe there were fracture blisters or skin blisters so this had a delayed surgical repair. In the meantime she was admitted to hospital from 02/20/2019 through 03/07/2019 with a diverticular bleed and MSSA sepsis. TEE was negative. It was felt her surgical ankle site was the source although it seems that this underwent an ORIF on 03/14/2019. She was given a 23-day course of cefazolin post discharge and I believe she was sent to a skilled facility in [place]. She developed right ankle swelling in August oral antibiotics were again given. There is a description of the wound relating to a [facility]2019. Dr. Rudene Christians took her back to the OR on 11/12/2019 I think for removal loath to localize screws. An operative culture again grew MSSA she was given a 7-day course of Keflex. She has been followed by  orthopedics according to her husband she has a protruding area of hyper granular tissue that they have been shaving down but it keeps on returning. She was referred here for review of this difficult wound. 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 We could not do an ABI in this clinic because of discomfort when we started to inflate the cuff Electronic Signature(s) Signed: 01/09/2020 7:46:45 PM By: Linton Ham MD Entered By: Linton Ham on 01/08/2020 [place], [place]. ([id number]) -------------------------------------------------------------------------------- Physical Exam Details Patient Name: Jasmine Morrow, Jasmine Morrow. Date of Service: 01/08/2020 12:45 PM Medical Record Number: [id number] Patient Account Number: [id number] Date of Birth/Sex: 1945-07-18 (74 y.o. F) Treating RN: Cornell Barman Primary Care Provider: Emily Filbert Other Clinician: Referring Provider: Emily Filbert Treating Provider/Extender: Tito Dine in Treatment: 0 Constitutional Sitting or standing Blood Pressure is within target range for patient.. Pulse regular and within target range for patient.Marland Kitchen Respirations regular, non- labored and within target range.. Temperature is normal and within the target range for the patient.Marland Kitchen appears in no distress. Cardiovascular Heart rhythm and rate regular, without murmur or gallop.. Pedal pulses were palpable at the dorsalis pedis and posterior tibial. Nonpitting edema in the right leg. Integumentary (Hair, Skin) No primary skin lesions were seen. Psychiatric No evidence of depression, anxiety, or agitation. Calm, cooperative, and communicative. Appropriate interactions and  affect.. Notes Wound exam; the patient has a small area on the medial right ankle that is open. On arrival she has the same protruding granular tissue that her husband shows me on the phone that orthopedics has been debriding down. Probing down through this goes straight down to bone or hardware. There was no gross purulence no surrounding erythema Electronic Signature(s) Signed: 01/09/2020 7:46:45 PM By: Linton Ham MD Entered By: Linton Ham on 01/08/2020 14:35:26 Jasmine Morrow ([id number]) -------------------------------------------------------------------------------- Physician Orders Details Patient Name: Jasmine Morrow. Date of Service: 01/08/2020 12:45 PM Medical Record Number: [id number] Patient Account Number: [id number] Date of Birth/Sex: 10-11-1945 (74 y.o. F) Treating RN: Cornell Barman Primary Care Provider: Emily Filbert Other Clinician: Referring Provider: Emily Filbert Treating Provider/Extender: Tito Dine in Treatment: 0 Verbal / Phone Orders: No Diagnosis Coding Wound Cleansing Wound #1 Right Lower Leg o Antibacterial soap, wash wounds, rinse and pat dry prior to dressing wounds Anesthetic (add to Medication List) Wound #1 Right Lower Leg o Topical Lidocaine 4% cream applied to wound bed prior to debridement (In Clinic Only). Primary Wound Dressing Wound #1 Right Lower Leg o Silver Alginate Secondary Dressing Wound #1 Right Lower Leg o XtraSorb Dressing Change Frequency Wound #1 Right Lower Leg o Change Dressing Monday, Wednesday, Friday Follow-up Appointments Wound #1 Right Lower Leg o Return Appointment in 1 week. Edema Control Wound #1 Right Lower Leg o 3 Layer Compression System - Left Lower Extremity o Elevate legs to the level of the heart and pump ankles as often as possible Additional Orders / Instructions Wound #1 Right Lower Leg o Increase protein intake. Home Health Wound #1 Right Lower Leg o [place] Visits - [place] Nurse may visit PRN to address patientos wound care needs. o *FACE TO FACE ENCOUNTER: MEDICARE and MEDICAID PATIENTS: I certify that this patient is under my care and that I had a face-to-face encounter that meets the physician face-to-face encounter requirements with this patient on this date. The encounter with the patient was in whole or in part for the following MEDICAL CONDITION: (primary reason for [place]) MEDICAL NECESSITY: I certify, that based on my findings, NURSING services are a medically necessary home health  service. *HOME BOUND STATUS: I certify that my clinical findings support that this patient is homebound (i.e., Due to illness or injury, pt requires aid of supportive devices such as crutches, cane, wheelchairs, walkers, the use of special transportation or the assistance of another person to leave their place of residence. There is a normal inability to leave the home and doing so requires considerable and taxing effort. Other absences are for medical reasons / religious services and are infrequent or of short duration when for other reasons). *Please direct any NON-WOUND related issues/requests for orders to patient's Primary Care Physician. *If current dressing causes regression in wound condition, may D/C ordered dressing product/s and apply Normal Saline Moist Dressing daily until next [place] / Other MD appointment. [place] of regression in wound condition at [phone]. o Please direct any NON-WOUND related issues/requests for orders to patient's Primary Care Physician Jasmine Morrow, Jasmine Morrow ([id number]) Laboratory o Bacteria identified in Wound by Culture (MICRO) - right lower leg oooo LOINC Code: 4166-0 oooo Convenience Name: Wound culture routine Electronic Signature(s) Signed: 01/09/2020 7:46:45 PM By: Linton Ham MD Signed: 01/16/2020 5:16:53 PM By: Gretta Cool, BSN,  RN, CWS, Kim RN, BSN Entered By: Gretta Cool, BSN, RN, CWS, Kim on 01/08/2020 14:06:35 Jasmine Morrow ([id number]) -------------------------------------------------------------------------------- Problem List Details Patient Name: ANNALEE, MEYERHOFF. Date of Service: 01/08/2020 12:45 PM Medical Record Number: [id number] Patient Account Number: [id number] Date of Birth/Sex: February 02, 1945 (74 y.o. 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 Date of Service: 01/08/2020 12:45 PM Medical Record Number: [id number] Patient Account Number: [id number] Date of Birth/Sex: [birth year] (74 y.o. F) Treating RN: Cornell Barman Primary Care Provider: Emily Filbert Other Clinician: Referring Provider: Emily Filbert Treating Provider/Extender: Tito Dine in Treatment: 0 Subjective Chief Complaint Information obtained from Patient 01/08/2020; patient is here for review of a nonhealing wound on her right medial ankle in the setting of recent surgery History of Present Illness (HPI) ADMISSION 01/08/2020 This is a 74 year old  woman who is not a diabetic. She had a right ankle trimalleolar fracture that she suffered in early January. I believe there were fracture blisters or skin blisters so this had a delayed surgical repair. In the meantime she was admitted to hospital from 02/20/2019 through 03/07/2019 with a diverticular bleed and MSSA sepsis. TEE was negative. It was felt her surgical ankle site was the source although it seems that this underwent an ORIF on 03/14/2019. She was given a 23-day course of cefazolin post discharge and I believe she was sent to a skilled facility in [place]. She developed right ankle swelling in August oral antibiotics were again given. There is a description of the wound relating to a call from [facility] on 10[facility]03[facility]2019. Dr. Rudene Christians took her back to the OR on 11/12/2019 I think for removal loath to localize screws. An operative culture again grew MSSA  she was given a 7-day course of Keflex. She has been followed by orthopedics according to her husband she has a protruding area of hyper granular tissue that they have been shaving down but it keeps on returning. She was referred here for review of this difficult wound. 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 We could not do an ABI in this clinic because of discomfort when we started to inflate the cuff Patient History Information obtained from Patient. Allergies morphine (Reaction: itching), sulfur, Cipro Family History Cancer - Mother, Heart Disease - Mother, Lung Disease - Father, No family history of Diabetes, Hereditary Spherocytosis, Hypertension, Kidney Disease, Seizures, Stroke, Thyroid Problems, Tuberculosis. Social History Former smoker, Marital  Status - Married, Alcohol Use - Never, Drug Use - No History, Caffeine Use - Moderate. Medical History Eyes Denies history of Cataracts, Glaucoma, Optic Neuritis Ear/Nose/Mouth/Throat Denies history of Chronic sinus problems/congestion, Middle ear problems Hematologic/Lymphatic Denies history of Anemia, Hemophilia, Human Immunodeficiency Virus, Lymphedema, Sickle Cell Disease Respiratory Denies history of Aspiration, Asthma, Chronic Obstructive Pulmonary Disease (COPD), Pneumothorax, Sleep Apnea, Tuberculosis Cardiovascular Patient has history of Hypertension Denies history of Angina, Arrhythmia, Congestive Heart Failure, Coronary Artery Disease, Deep Vein Thrombosis, Hypotension, Myocardial Infarction, Peripheral Arterial Disease, Peripheral Venous Disease, Phlebitis, Vasculitis Gastrointestinal Denies history of Cirrhosis , Colitis, Crohn s, Hepatitis A, Hepatitis B, Hepatitis C Endocrine Denies history of Type I Diabetes, Type II Diabetes Genitourinary Denies history of End Stage Renal Disease Immunological Denies history of Lupus Erythematosus, Raynaud s, Scleroderma Integumentary (Skin) Denies history of History of Burn, History of pressure wounds Musculoskeletal Patient has history of Osteoarthritis Jasmine Morrow, Jasmine Morrow ([id number]) Denies history of Gout, Rheumatoid Arthritis, Osteomyelitis Neurologic Denies history of Dementia, Neuropathy, Quadriplegia, Paraplegia, Seizure Disorder Oncologic Denies history of Received Chemotherapy, Received Radiation Psychiatric Denies history of Anorexia/bulimia, Confinement Anxiety Review of Systems (ROS) Constitutional Symptoms (General Health) Complains or has symptoms of Fatigue, Marked Weight Change - 100 pound weight loss since jan. Denies complaints or symptoms of Fever, Chills. Eyes Complains or has symptoms of Dry Eyes, Glasses / Contacts. Denies complaints or symptoms of Vision Changes. Ear/Nose/Mouth/Throat Denies complaints  or symptoms of Difficult clearing ears, Sinusitis. Hematologic/Lymphatic Denies complaints or symptoms of Bleeding / Clotting Disorders, Human Immunodeficiency Virus. Respiratory Denies complaints or symptoms of Chronic or frequent coughs, Shortness of Breath. Cardiovascular Denies complaints or symptoms of Chest pain, LE edema. Gastrointestinal Complains or has symptoms of Nausea. Denies complaints or symptoms of Frequent diarrhea, Vomiting, constipation Endocrine Denies complaints or symptoms of Hepatitis, Thyroid disease, Polydypsia (Excessive Thirst). Genitourinary Denies complaints or symptoms of Kidney failure/ Dialysis, Incontinence/dribbling. Immunological Denies complaints or symptoms of Hives, Itching. Integumentary (Skin) Complains or has symptoms of Wounds, Swelling. Denies complaints or symptoms of Bleeding or bruising tendency, Breakdown. Musculoskeletal Complains or has symptoms of Muscle Pain, Muscle Weakness. Neurologic Complains or has symptoms of Numbness/parasthesias - legs, Focal/Weakness. Psychiatric Complains or has symptoms of Anxiety. Denies complaints or symptoms of Claustrophobia. Objective Constitutional Sitting or standing Blood Pressure is within target range for patient.. Pulse regular and within target range for patient.Marland Kitchen Respirations regular, non- labored and within target range.. Temperature is normal and within the target range for the patient.Marland Kitchen appears in no distress. Vitals Time Taken: 1:30 PM, Height: 68 in, Source: Stated, Weight: 200 lbs, Source: Stated, BMI: 30.4,  Temperature: 97.6 F, Pulse: 105 bpm, Respiratory Rate: 16 breaths/min, Blood Pressure: 110/55 mmHg. Cardiovascular Heart rhythm and rate regular, without murmur or gallop.. Pedal pulses were palpable at the dorsalis pedis and posterior tibial. Nonpitting edema in the right leg. Psychiatric No evidence of depression, anxiety, or agitation. Calm, cooperative, and communicative.  Appropriate interactions and affect.. General Notes: Wound exam; the patient has a small area on the medial right ankle that is open. On arrival she has the same protruding granular tissue that her husband shows me on the phone that orthopedics has been debriding down. Probing down through this goes straight down to bone or hardware. There was no gross purulence no surrounding erythema Integumentary (Hair, Skin) No primary skin lesions were seen. Wound #1 status is Open. Original cause of wound was Surgical Injury. The wound is located on the Right Lower Leg. The wound measures 1.1cm length x 1.2cm width x 1.2cm depth; 1.037cm^2 area and 1.244cm^3 volume. There is Fat Layer (Subcutaneous Tissue) exposed. There is no Jasmine Morrow, Jasmine R. ([id number]) tunneling or undermining noted. There is a large amount of sanguinous drainage noted. There is medium (34-66%) red granulation within the wound bed. There is a small (1-33%) amount of necrotic tissue within the wound bed including Adherent Slough. 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 There was a Excisional Skin/Subcutaneous Tissue Debridement with a total area of 1.32 sq cm performed by Ricard Dillon, MD. With the following instrument(s): Blade to remove Viable and Non-Viable tissue/material. Material removed includes Subcutaneous Tissue after achieving pain control using Lidocaine. No specimens were taken. A time out was conducted at 14:28, prior to the start of the procedure. A Large amount of bleeding was controlled with Silver Nitrate. The procedure was tolerated  well. Post Debridement Measurements: 1.1cm length x 1.2cm width x 1.2cm depth; 1.244cm^3 volume. Character of Wound/Ulcer Post Debridement is stable. Post procedure Diagnosis Wound #1: Same as Pre-Procedure Plan Wound Cleansing: Wound #1 Right Lower Leg: Antibacterial soap, wash wounds, rinse and pat dry prior to dressing wounds Anesthetic (add to Medication List): Wound #1 Right Lower Leg: Topical Lidocaine 4% cream applied to wound bed prior to debridement (In Clinic Only). Primary Wound Dressing: Wound #1 Right Lower Leg: Silver Alginate Secondary Dressing: Wound #1 Right Lower Leg: XtraSorb Dressing Change Frequency: Wound #1 Right Lower Leg: Change Dressing Monday, Wednesday, Friday Follow-up Appointments: Wound #1 Right Lower Leg: Return Appointment in 1 week. Edema Control: Wound #1 Right Lower Leg: 3 Layer Compression System - Left Lower Extremity Elevate legs to the level of the heart and pump ankles as often as possible Additional Orders / Instructions: Wound #1 Right Lower Leg: Increase protein intake. Home Health: Wound #1 Right Lower Leg: Continue Home Health Visits - [place] Nurse may visit PRN to address patient s wound care needs. *FACE TO FACE ENCOUNTER: MEDICARE and MEDICAID PATIENTS: I certify that this patient is under my care and that I had a face-to-face encounter that meets the physician face-to-face encounter requirements with this patient on this date. The encounter with the patient was in whole or in part for the following MEDICAL CONDITION: (primary reason for [place]) MEDICAL NECESSITY: I certify, that based  on my findings, NURSING services are a medically necessary home health service. *HOME BOUND STATUS: I certify that my clinical findings support that this patient is homebound (i.e., Due to illness or injury, pt requires aid of supportive devices such as crutches, cane, wheelchairs, walkers, the use of special  transportation or the assistance of another person to leave their place of residence. There is a normal inability to leave the home and doing so requires considerable and taxing effort. Other absences are for medical reasons / religious services and are infrequent or of short duration when for other reasons). *Please direct any NON-WOUND related issues/requests for orders to patient's Primary Care Physician. *If Jasmine Morrow, Jasmine Morrow ([id number]) current dressing causes regression in wound condition, may D/C ordered dressing product/s and apply Normal Saline Moist Dressing daily until next [place] / Other MD appointment. [place] of regression in wound condition at [phone]. Please direct any NON-WOUND related issues/requests for orders to patient's Primary Care Physician Laboratory ordered were: Wound culture routine - right lower leg #1 raised area of hyper granular tissue over the wound that is apparently draining sanguinous material at home. This kind of presentation always makes me worried about chronic infection. I removed the hyper granulated tissue obtained a culture for CandS and then cauterize the area with silver nitrate. As mentioned this clearly guard goes down to either hardware or bone but neither was actively visible. 2. She has some degree of chronic venous insufficiency or lymphedema she will definitely need compression 3. No empiric antibiotics, I am awaiting a culture 4. We dressed the wound with silver alginate, drawtex ABDs and put her in 3 layer compression we will try to get home health to change the dressing 5. I am concerned about underlying osteomyelitis and/or a draining source of infection that we were not able to identify 6. The amount of hardware she has retained will make an MRI and possible even CT scanning this area would be difficult to make any sort of determination. I spent 45 minutes in review of the patient's past medical  history, face-to-face evaluation and preparation of this record Electronic Signature(s) Signed: 01/09/2020 7:46:45 PM By: Linton Ham MD Entered By: Linton Ham on 01/08/2020 14:37:59 Jasmine Morrow ([id number]) -------------------------------------------------------------------------------- ROS/PFSH Details Patient Name: Jasmine Morrow. Date of Service: 01/08/2020 12:45 PM Medical Record Number: [id number] Patient Account Number: [id number] Date of Birth/Sex: 05-15-1945 (74 y.o. F) Treating RN: Dolan Amen Primary Care Provider: Emily Filbert Other Clinician: Referring Provider: Emily Filbert Treating Provider/Extender: Tito Dine in Treatment: 0 Information Obtained From Patient Constitutional Symptoms (General Health) Complaints and Symptoms: Positive for: Fatigue; Marked Weight Change - 100 pound weight loss since jan Negative for: Fever; Chills Eyes Complaints and Symptoms: Positive for: Dry Eyes; Glasses / Contacts Negative for: Vision Changes Medical History: Negative for: Cataracts; Glaucoma; Optic Neuritis Ear/Nose/Mouth/Throat Complaints and Symptoms: Negative for: Difficult clearing ears; Sinusitis Medical History: Negative for: Chronic sinus problems/congestion; Middle ear problems Hematologic/Lymphatic Complaints and Symptoms: Negative for: Bleeding / Clotting Disorders; Human Immunodeficiency Virus Medical History: Negative for: Anemia; Hemophilia; Human Immunodeficiency Virus; Lymphedema; Sickle Cell Disease Respiratory Complaints and Symptoms: Negative for: Chronic or frequent coughs; Shortness of Breath Medical History: Negative for: Aspiration; Asthma; Chronic Obstructive Pulmonary Disease (COPD); Pneumothorax; Sleep Apnea; Tuberculosis Cardiovascular Complaints and Symptoms: Negative for: Chest pain; LE edema Medical History: Positive for: Hypertension Negative for: Angina; Arrhythmia; Congestive Heart Failure; Coronary Artery  Disease; Deep Vein Thrombosis; Hypotension; Myocardial Infarction; Peripheral Arterial Disease;  Peripheral Venous Disease; Phlebitis; Vasculitis Gastrointestinal Complaints and Symptoms: Positive for: Nausea Negative for: Frequent diarrhea; Vomiting Review of System Notes: constipation Medical HistoryBREEAN, Jasmine Morrow ([id number]) Negative for: Cirrhosis ; Colitis; Crohnos; Hepatitis A; Hepatitis B; Hepatitis C Endocrine Complaints and Symptoms: Negative for: Hepatitis; Thyroid disease; Polydypsia (Excessive Thirst) Medical History: Negative for: Type I Diabetes; Type II Diabetes Genitourinary Complaints and Symptoms: Negative for: Kidney failure/ Dialysis; Incontinence/dribbling Medical History: Negative for: End Stage Renal Disease Immunological Complaints and Symptoms: Negative for: Hives; Itching Medical History: Negative for: Lupus Erythematosus; Raynaudos; Scleroderma Integumentary (Skin) Complaints and Symptoms: Positive for: Wounds; Swelling Negative for: Bleeding or bruising tendency; Breakdown Medical History: Negative for: History of Burn; History of pressure wounds Musculoskeletal Complaints and Symptoms: Positive for: Muscle Pain; Muscle Weakness Medical History: Positive for: Osteoarthritis Negative for: Gout; Rheumatoid Arthritis; Osteomyelitis Neurologic Complaints and Symptoms: Positive for: Numbness/parasthesias - legs; Focal/Weakness Medical History: Negative for: Dementia; Neuropathy; Quadriplegia; Paraplegia; Seizure Disorder Psychiatric Complaints and Symptoms: Positive for: Anxiety Negative for: Claustrophobia Medical History: Negative for: Anorexia/bulimia; Confinement Anxiety Oncologic Medical History: Negative for: Received Chemotherapy; Received Radiation Immunizations Pneumococcal Vaccine: Received Pneumococcal Vaccination: Yes Jasmine Morrow, Jasmine Morrow ([id number]) Implantable Devices None Family and Social History Cancer: Yes - Mother;  Diabetes: No; Heart Disease: Yes - Mother; Hereditary Spherocytosis: No; Hypertension: No; Kidney Disease: No; Lung Disease: Yes - Father; Seizures: No; Stroke: No; Thyroid Problems: No; Tuberculosis: No; Former smoker; Marital Status - Married; Alcohol Use: Never; Drug Use: No History; Caffeine Use: Moderate Electronic Signature(s) Signed: 01/09/2020 4:55:46 PM By: Charlett Nose Signed: 01/09/2020 7:46:45 PM By: Linton Ham MD Entered By: Georges Mouse, Minus Breeding on 01/08/2020 13:39:51 Jasmine Morrow ([id number]) -------------------------------------------------------------------------------- [place] Details Patient Name: Jasmine Morrow, Jasmine Morrow. Date of Service: 01/08/2020 Medical Record Number: [id number] Patient Account Number: [id number] Date of Birth/Sex: November 01, 1945 (74 y.o. 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 B95.61 Methicillin susceptible Staphylococcus aureus infection as the cause of diseases classified elsewhere Facility Procedures CPT4 Code: 14239532 Description: 99213 - WOUND CARE VISIT-LEV 3 EST PT Modifier: Quantity: 1 CPT4 Code: 02334356 Description: 11042 - DEB SUBQ TISSUE 20 SQ CM/< Modifier: Quantity: 1 CPT4 Code: Description: ICD-10 Diagnosis Description 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 Modifier: Quantity: Physician Procedures CPT4 Code Description: 8616837 29021 - WC PHYS LEVEL 4 - NEW PT Modifier: 25 Quantity: 1 CPT4 Code Description: ICD-10 Diagnosis Description T81.31XD Disruption of external operation (surgical) wound, not  elsewhere classifie 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

## 2020-01-22 ENCOUNTER — Other Ambulatory Visit: Payer: Self-pay

## 2020-01-22 ENCOUNTER — Encounter: Payer: Medicare Other | Admitting: Internal Medicine

## 2020-01-22 ENCOUNTER — Emergency Department: Payer: Medicare Other

## 2020-01-22 ENCOUNTER — Inpatient Hospital Stay
Admission: EM | Admit: 2020-01-22 | Discharge: 2020-02-01 | DRG: 485 | Disposition: A | Discharge status: Home Health | Payer: Medicare Other | Attending: Internal Medicine | Admitting: Internal Medicine

## 2020-01-22 ENCOUNTER — Encounter: Payer: Self-pay | Admitting: *Deleted

## 2020-01-22 DIAGNOSIS — Z96653 Presence of artificial knee joint, bilateral: Secondary | ICD-10-CM | POA: Diagnosis present

## 2020-01-22 DIAGNOSIS — Z885 Allergy status to narcotic agent status: Secondary | ICD-10-CM | POA: Diagnosis not present

## 2020-01-22 DIAGNOSIS — Z87891 Personal history of nicotine dependence: Secondary | ICD-10-CM | POA: Diagnosis not present

## 2020-01-22 DIAGNOSIS — T8453XD Infection and inflammatory reaction due to internal right knee prosthesis, subsequent encounter: Secondary | ICD-10-CM | POA: Diagnosis not present

## 2020-01-22 DIAGNOSIS — B3749 Other urogenital candidiasis: Secondary | ICD-10-CM | POA: Diagnosis not present

## 2020-01-22 DIAGNOSIS — I10 Essential (primary) hypertension: Secondary | ICD-10-CM | POA: Diagnosis present

## 2020-01-22 DIAGNOSIS — L02415 Cutaneous abscess of right lower limb: Secondary | ICD-10-CM | POA: Diagnosis present

## 2020-01-22 DIAGNOSIS — D509 Iron deficiency anemia, unspecified: Secondary | ICD-10-CM | POA: Diagnosis present

## 2020-01-22 DIAGNOSIS — D631 Anemia in chronic kidney disease: Secondary | ICD-10-CM | POA: Diagnosis not present

## 2020-01-22 DIAGNOSIS — R110 Nausea: Secondary | ICD-10-CM | POA: Diagnosis not present

## 2020-01-22 DIAGNOSIS — Z7982 Long term (current) use of aspirin: Secondary | ICD-10-CM | POA: Diagnosis not present

## 2020-01-22 DIAGNOSIS — L089 Local infection of the skin and subcutaneous tissue, unspecified: Secondary | ICD-10-CM | POA: Diagnosis present

## 2020-01-22 DIAGNOSIS — T8459XA Infection and inflammatory reaction due to other internal joint prosthesis, initial encounter: Secondary | ICD-10-CM | POA: Diagnosis not present

## 2020-01-22 DIAGNOSIS — Y831 Surgical operation with implant of artificial internal device as the cause of abnormal reaction of the patient, or of later complication, without mention of misadventure at the time of the procedure: Secondary | ICD-10-CM | POA: Diagnosis present

## 2020-01-22 DIAGNOSIS — Z825 Family history of asthma and other chronic lower respiratory diseases: Secondary | ICD-10-CM | POA: Diagnosis not present

## 2020-01-22 DIAGNOSIS — T8459XD Infection and inflammatory reaction due to other internal joint prosthesis, subsequent encounter: Secondary | ICD-10-CM | POA: Diagnosis not present

## 2020-01-22 DIAGNOSIS — D62 Acute posthemorrhagic anemia: Secondary | ICD-10-CM | POA: Diagnosis not present

## 2020-01-22 DIAGNOSIS — Z20822 Contact with and (suspected) exposure to covid-19: Secondary | ICD-10-CM | POA: Diagnosis present

## 2020-01-22 DIAGNOSIS — D638 Anemia in other chronic diseases classified elsewhere: Secondary | ICD-10-CM | POA: Diagnosis not present

## 2020-01-22 DIAGNOSIS — F32A Depression, unspecified: Secondary | ICD-10-CM | POA: Diagnosis present

## 2020-01-22 DIAGNOSIS — Z882 Allergy status to sulfonamides status: Secondary | ICD-10-CM

## 2020-01-22 DIAGNOSIS — T8453XA Infection and inflammatory reaction due to internal right knee prosthesis, initial encounter: Secondary | ICD-10-CM | POA: Diagnosis present

## 2020-01-22 DIAGNOSIS — M25461 Effusion, right knee: Secondary | ICD-10-CM | POA: Diagnosis not present

## 2020-01-22 DIAGNOSIS — A4101 Sepsis due to Methicillin susceptible Staphylococcus aureus: Secondary | ICD-10-CM | POA: Diagnosis present

## 2020-01-22 DIAGNOSIS — M00071 Staphylococcal arthritis, right ankle and foot: Secondary | ICD-10-CM | POA: Diagnosis not present

## 2020-01-22 DIAGNOSIS — R7881 Bacteremia: Secondary | ICD-10-CM | POA: Diagnosis not present

## 2020-01-22 DIAGNOSIS — Z9109 Other allergy status, other than to drugs and biological substances: Secondary | ICD-10-CM

## 2020-01-22 DIAGNOSIS — Z23 Encounter for immunization: Secondary | ICD-10-CM

## 2020-01-22 DIAGNOSIS — Z809 Family history of malignant neoplasm, unspecified: Secondary | ICD-10-CM | POA: Diagnosis not present

## 2020-01-22 DIAGNOSIS — Z888 Allergy status to other drugs, medicaments and biological substances status: Secondary | ICD-10-CM | POA: Diagnosis not present

## 2020-01-22 DIAGNOSIS — K219 Gastro-esophageal reflux disease without esophagitis: Secondary | ICD-10-CM

## 2020-01-22 DIAGNOSIS — B958 Unspecified staphylococcus as the cause of diseases classified elsewhere: Secondary | ICD-10-CM | POA: Diagnosis not present

## 2020-01-22 DIAGNOSIS — Z79899 Other long term (current) drug therapy: Secondary | ICD-10-CM

## 2020-01-22 DIAGNOSIS — T8450XA Infection and inflammatory reaction due to unspecified internal joint prosthesis, initial encounter: Secondary | ICD-10-CM | POA: Diagnosis not present

## 2020-01-22 DIAGNOSIS — Z96659 Presence of unspecified artificial knee joint: Secondary | ICD-10-CM | POA: Diagnosis not present

## 2020-01-22 DIAGNOSIS — F419 Anxiety disorder, unspecified: Secondary | ICD-10-CM | POA: Diagnosis present

## 2020-01-22 DIAGNOSIS — Z886 Allergy status to analgesic agent status: Secondary | ICD-10-CM

## 2020-01-22 DIAGNOSIS — K922 Gastrointestinal hemorrhage, unspecified: Secondary | ICD-10-CM | POA: Diagnosis present

## 2020-01-22 DIAGNOSIS — L0291 Cutaneous abscess, unspecified: Secondary | ICD-10-CM

## 2020-01-22 DIAGNOSIS — G4733 Obstructive sleep apnea (adult) (pediatric): Secondary | ICD-10-CM | POA: Diagnosis present

## 2020-01-22 DIAGNOSIS — J45909 Unspecified asthma, uncomplicated: Secondary | ICD-10-CM | POA: Diagnosis present

## 2020-01-22 DIAGNOSIS — M216X1 Other acquired deformities of right foot: Secondary | ICD-10-CM | POA: Diagnosis present

## 2020-01-22 DIAGNOSIS — A4901 Methicillin susceptible Staphylococcus aureus infection, unspecified site: Secondary | ICD-10-CM | POA: Diagnosis not present

## 2020-01-22 DIAGNOSIS — M96661 Fracture of femur following insertion of orthopedic implant, joint prosthesis, or bone plate, right leg: Secondary | ICD-10-CM | POA: Diagnosis present

## 2020-01-22 DIAGNOSIS — Z8619 Personal history of other infectious and parasitic diseases: Secondary | ICD-10-CM | POA: Diagnosis not present

## 2020-01-22 DIAGNOSIS — D649 Anemia, unspecified: Secondary | ICD-10-CM | POA: Diagnosis not present

## 2020-01-22 DIAGNOSIS — L0889 Other specified local infections of the skin and subcutaneous tissue: Secondary | ICD-10-CM | POA: Diagnosis not present

## 2020-01-22 LAB — COMPREHENSIVE METABOLIC PANEL
ALT: 11 U/L (ref 0–44)
AST: 20 U/L (ref 15–41)
Albumin: 3.1 g/dL — ABNORMAL LOW (ref 3.5–5.0)
Alkaline Phosphatase: 124 U/L (ref 38–126)
Anion gap: 13 (ref 5–15)
BUN: 13 mg/dL (ref 8–23)
CO2: 22 mmol/L (ref 22–32)
Calcium: 10.5 mg/dL — ABNORMAL HIGH (ref 8.9–10.3)
Chloride: 102 mmol/L (ref 98–111)
Creatinine, Ser: 0.86 mg/dL (ref 0.44–1.00)
GFR, Estimated: >60 mL/min (ref >60)
Glucose, Bld: 132 mg/dL — ABNORMAL HIGH (ref 70–99)
Potassium: 4.1 mmol/L (ref 3.5–5.1)
Sodium: 137 mmol/L (ref 135–145)
Total Bilirubin: 0.6 mg/dL (ref 0.3–1.2)
Total Protein: 7.8 g/dL (ref 6.5–8.1)

## 2020-01-22 LAB — CBC WITH DIFFERENTIAL/PLATELET
Abs Immature Granulocytes: 0.08 10*3/uL — ABNORMAL HIGH (ref 0.00–0.07)
Basophils Absolute: 0.1 10*3/uL (ref 0.0–0.1)
Basophils Relative: 0 %
Eosinophils Absolute: 0.1 10*3/uL (ref 0.0–0.5)
Eosinophils Relative: 1 %
HCT: 33.2 % — ABNORMAL LOW (ref 36.0–46.0)
Hemoglobin: 10 g/dL — ABNORMAL LOW (ref 12.0–15.0)
Immature Granulocytes: 1 %
Lymphocytes Relative: 10 %
Lymphs Abs: 1.6 10*3/uL (ref 0.7–4.0)
MCH: 21.1 pg — ABNORMAL LOW (ref 26.0–34.0)
MCHC: 30.1 g/dL (ref 30.0–36.0)
MCV: 70.2 fL — ABNORMAL LOW (ref 80.0–100.0)
Monocytes Absolute: 0.9 10*3/uL (ref 0.1–1.0)
Monocytes Relative: 6 %
Neutro Abs: 12.8 10*3/uL — ABNORMAL HIGH (ref 1.7–7.7)
Neutrophils Relative %: 82 %
Platelets: 797 10*3/uL — ABNORMAL HIGH (ref 150–400)
RBC: 4.73 MIL/uL (ref 3.87–5.11)
RDW: 16 % — ABNORMAL HIGH (ref 11.5–15.5)
WBC: 15.5 10*3/uL — ABNORMAL HIGH (ref 4.0–10.5)
nRBC: 0 % (ref 0.0–0.2)

## 2020-01-22 LAB — RESP PANEL BY RT-PCR (FLU A&B, COVID) ARPGX2
Influenza A by PCR: NEGATIVE
Influenza B by PCR: NEGATIVE
SARS Coronavirus 2 by RT PCR: NEGATIVE

## 2020-01-22 LAB — LACTIC ACID, PLASMA
Lactic Acid, Venous: 2.7 mmol/L (ref 0.5–1.9)
Lactic Acid, Venous: 2.3 mmol/L (ref 0.5–1.9)

## 2020-01-22 LAB — PROTIME-INR
INR: 1.2 (ref 0.8–1.2)
Prothrombin Time: 14.3 seconds (ref 11.4–15.2)

## 2020-01-22 MED ORDER — TRAMADOL HCL 50 MG PO TABS
50.0000 mg | ORAL_TABLET | Freq: Four times a day (QID) | ORAL | Status: DC | PRN
Start: 2020-01-22 — End: 2020-01-24
  Administered 2020-01-23: 50 mg via ORAL
  Filled 2020-01-22 (×2): qty 1

## 2020-01-22 MED ORDER — SODIUM CHLORIDE 0.9 % IV SOLN: INTRAVENOUS | Status: DC

## 2020-01-22 MED ORDER — ONDANSETRON HCL 4 MG/2ML IJ SOLN
4.0000 mg | Freq: Four times a day (QID) | INTRAMUSCULAR | Status: DC | PRN
  Administered 2020-01-23: 4 mg via INTRAVENOUS
  Filled 2020-01-22: qty 2

## 2020-01-22 MED ORDER — DEXTROSE 5 % IV SOLN
2.0000 g | Freq: Three times a day (TID) | INTRAVENOUS | Status: DC
  Administered 2020-01-23: 2 g via INTRAVENOUS
  Filled 2020-01-22 (×3): qty 20

## 2020-01-22 MED ORDER — ACETAMINOPHEN 325 MG PO TABS: 650.0000 mg | ORAL_TABLET | Freq: Four times a day (QID) | ORAL | Status: DC | PRN

## 2020-01-22 MED ORDER — ADULT MULTIVITAMIN W/MINERALS CH
1.0000 | ORAL_TABLET | Freq: Every day | ORAL | Status: DC
  Administered 2020-01-25 – 2020-01-30 (×6): 1 via ORAL
  Filled 2020-01-22 (×8): qty 1

## 2020-01-22 MED ORDER — LORATADINE 10 MG PO TABS
10.0000 mg | ORAL_TABLET | Freq: Every day | ORAL | Status: DC
  Administered 2020-01-23 – 2020-01-31 (×8): 10 mg via ORAL
  Filled 2020-01-22 (×8): qty 1

## 2020-01-22 MED ORDER — MAGNESIUM HYDROXIDE 400 MG/5ML PO SUSP: 30.0000 mL | Freq: Every day | ORAL | Status: DC | PRN

## 2020-01-22 MED ORDER — SODIUM CHLORIDE 0.9 % IV SOLN
2.0000 g | Freq: Once | INTRAVENOUS | Status: AC
  Administered 2020-01-22: 2 g via INTRAVENOUS
  Filled 2020-01-22: qty 20

## 2020-01-22 MED ORDER — TRAZODONE HCL 50 MG PO TABS
25.0000 mg | ORAL_TABLET | Freq: Every evening | ORAL | Status: DC | PRN
Start: 2020-01-22 — End: 2020-02-01
  Filled 2020-01-22: qty 1

## 2020-01-22 MED ORDER — LACTATED RINGERS IV BOLUS (SEPSIS): 1000.0000 mL | Freq: Once | INTRAVENOUS | Status: DC

## 2020-01-22 MED ORDER — VANCOMYCIN HCL IN DEXTROSE 1-5 GM/200ML-% IV SOLN
1000.0000 mg | Freq: Once | INTRAVENOUS | Status: AC
  Administered 2020-01-22: 1000 mg via INTRAVENOUS
  Filled 2020-01-22: qty 200

## 2020-01-22 MED ORDER — ALBUTEROL SULFATE HFA 108 (90 BASE) MCG/ACT IN AERS
2.0000 | INHALATION_SPRAY | RESPIRATORY_TRACT | Status: DC | PRN
  Filled 2020-01-22: qty 6.7

## 2020-01-22 MED ORDER — ESCITALOPRAM OXALATE 10 MG PO TABS
10.0000 mg | ORAL_TABLET | Freq: Every day | ORAL | Status: DC
  Administered 2020-01-23 – 2020-01-31 (×8): 10 mg via ORAL
  Filled 2020-01-22 (×10): qty 1

## 2020-01-22 MED ORDER — ACETAMINOPHEN 650 MG RE SUPP: 650.0000 mg | Freq: Four times a day (QID) | RECTAL | Status: DC | PRN

## 2020-01-22 MED ORDER — CALCIUM CARBONATE-VITAMIN D 500-200 MG-UNIT PO TABS
2.0000 | ORAL_TABLET | Freq: Every day | ORAL | Status: DC
  Administered 2020-01-25 – 2020-01-30 (×6): 2 via ORAL
  Filled 2020-01-22 (×8): qty 2

## 2020-01-22 MED ORDER — CARVEDILOL 6.25 MG PO TABS
3.1250 mg | ORAL_TABLET | Freq: Two times a day (BID) | ORAL | Status: DC
Start: 2020-01-22 — End: 2020-01-24
  Filled 2020-01-22: qty 1

## 2020-01-22 MED ORDER — ONDANSETRON HCL 4 MG PO TABS: 4.0000 mg | ORAL_TABLET | Freq: Four times a day (QID) | ORAL | Status: DC | PRN

## 2020-01-22 MED ORDER — OCUVITE-LUTEIN PO CAPS
1.0000 | ORAL_CAPSULE | Freq: Two times a day (BID) | ORAL | Status: DC
  Administered 2020-01-25 – 2020-01-31 (×12): 1 via ORAL
  Filled 2020-01-22 (×19): qty 1

## 2020-01-22 MED ORDER — LACTATED RINGERS IV SOLN: INTRAVENOUS | Status: DC

## 2020-01-22 MED ORDER — VANCOMYCIN HCL IN DEXTROSE 1-5 GM/200ML-% IV SOLN: 1000.0000 mg | Freq: Once | INTRAVENOUS | Status: DC

## 2020-01-22 MED ORDER — PANTOPRAZOLE SODIUM 40 MG PO TBEC
40.0000 mg | DELAYED_RELEASE_TABLET | Freq: Every day | ORAL | Status: DC
  Administered 2020-01-23: 40 mg via ORAL
  Filled 2020-01-22: qty 1

## 2020-01-22 MED ORDER — ENOXAPARIN SODIUM 40 MG/0.4ML ~~LOC~~ SOLN
40.0000 mg | SUBCUTANEOUS | Status: DC
  Administered 2020-01-23: 40 mg via SUBCUTANEOUS
  Filled 2020-01-22: qty 0.4

## 2020-01-22 MED ORDER — SODIUM CHLORIDE 0.9 % IV BOLUS
1000.0000 mL | Freq: Once | INTRAVENOUS | Status: AC
  Administered 2020-01-22: 1000 mL via INTRAVENOUS

## 2020-01-22 MED ORDER — HYPROMELLOSE (GONIOSCOPIC) 2.5 % OP SOLN
1.0000 [drp] | Freq: Three times a day (TID) | OPHTHALMIC | Status: DC | PRN
  Filled 2020-01-22: qty 15

## 2020-01-22 NOTE — ED Provider Notes (Signed)
[facility] Emergency Department Provider Note  ____________________________________________   I have reviewed the triage vital signs and the nursing notes.   HISTORY  Chief Complaint Wound Infection   History limited by: Not Limited   HPI Jasmine Morrow is a 74 y.o. female who presents to the emergency department today because of concerns for infection.  Patient has had issues with a right ankle wound after ankle fracture and ORIF for roughly 1 year.  She states that she is being seen at wound care center for this.  Today she went in to have them take a look at that also discussed with them and now an area around her right knee that was draining earlier today.  She denies any acute pain to her knee, although has history of bilateral knee pain. She states while at wound care center they were able to get out a lot of fluid from the knee wound.  They were concerned that she might be septic.   Records reviewed. Per medical record review patient has a history of complicated history revolving around right ankle fracture s/p orif and chronic wounds.   Past Medical History:  Diagnosis Date  . Anxiety   . Asthma   . GERD (gastroesophageal reflux disease)   . Hypertension   . Seasonal allergies   . Sleep apnea     Patient Active Problem List   Diagnosis Date Noted  . Pressure injury of skin 02/28/2019  . SOB (shortness of breath)   . Gastrointestinal hemorrhage   . Sepsis ([place])   . Symptomatic anemia 02/20/2019  . Trimalleolar fracture of ankle, closed, right, initial encounter 02/07/2019  . Multiple lung nodules 03/31/2014  . 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 OSA on CPAP 03/27/2014    Past Surgical History:  Procedure Laterality Date  . CESAREAN SECTION    . DILATION AND CURETTAGE OF UTERUS    . ESOPHAGOGASTRODUODENOSCOPY N/A 02/22/2019   Procedure: ESOPHAGOGASTRODUODENOSCOPY (EGD);  Surgeon: Toledo, Benay Pike, MD;  Location: [facility] ENDOSCOPY;  Service:  Gastroenterology;  Laterality: N/A;  . FLEXIBLE SIGMOIDOSCOPY N/A 02/22/2019   Procedure: FLEXIBLE SIGMOIDOSCOPY;  Surgeon: Toledo, Benay Pike, MD;  Location: [facility] ENDOSCOPY;  Service: Gastroenterology;  Laterality: N/A;  . HARDWARE REMOVAL Right 11/12/2019   Procedure: Right ankle hardware removal;  Surgeon: Hessie Knows, MD;  Location: [facility] ORS;  Service: Orthopedics;  Laterality: Right;  . ORIF ANKLE FRACTURE Right 03/14/2019   Procedure: OPEN REDUCTION INTERNAL FIXATION (ORIF) ANKLE FRACTURE, MEDIAL MALLEOLUS;  Surgeon: Hessie Knows, MD;  Location: [facility] ORS;  Service: Orthopedics;  Laterality: Right;  . ORIF ANKLE FRACTURE Right 02/08/2019   Procedure: OPEN REDUCTION INTERNAL FIXATION (ORIF) ANKLE FRACTURE, post malleolus;  Surgeon: Hessie Knows, MD;  Location: [facility] ORS;  Service: Orthopedics;  Laterality: Right;  . REPLACEMENT TOTAL KNEE Bilateral 2008   2009  . SYNDESMOSIS REPAIR Right 02/08/2019   Procedure: SYNDESMOSIS REPAIR;  Surgeon: Hessie Knows, MD;  Location: [facility] ORS;  Service: Orthopedics;  Laterality: Right;  . TEE WITHOUT CARDIOVERSION N/A 02/26/2019   Procedure: TRANSESOPHAGEAL ECHOCARDIOGRAM (TEE);  Surgeon: Corey Skains, MD;  Location: [facility] ORS;  Service: Cardiovascular;  Laterality: N/A;  . 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 Authorizing Provider  albuterol (PROVENTIL HFA;VENTOLIN HFA) 108 (90 Base) MCG/ACT inhaler Inhale 2 puffs into the lungs every 4 (four) hours as needed. Patient taking differently: Inhale 2 puffs  into the lungs every 4 (four) hours as needed for wheezing or shortness of breath.  05/09/16   Wilhelmina Mcardle, MD  aspirin EC 81 MG tablet Take 81 mg by mouth daily. Swallow whole.    [provider]  Calcium-Vitamin D 600-200 MG-UNIT per tablet Take 2 tablets by mouth daily.     [provider]  carvedilol (COREG) 3.125 MG tablet Take 3.125  mg by mouth 2 (two) times daily. 12/07/18   [provider]  cetirizine (ZYRTEC) 10 MG tablet Take 10 mg by mouth daily.    [provider]  escitalopram (LEXAPRO) 10 MG tablet Take 10 mg by mouth daily. 10/17/19   [provider]  hydroxypropyl methylcellulose / hypromellose (ISOPTO TEARS / GONIOVISC) 2.5 % ophthalmic solution Place 1 drop into both eyes 3 (three) times daily as needed for dry eyes.     [provider]  Multiple Vitamin (MULTIVITAMIN WITH MINERALS) TABS tablet Take 1 tablet by mouth daily.    [provider]  Multiple Vitamins-Minerals (PRESERVISION AREDS 2 PO) Take 1 tablet by mouth 2 (two) times daily.    [provider]  nitrofurantoin, macrocrystal-monohydrate, (MACROBID) 100 MG capsule Take 100 mg by mouth 2 (two) times daily. 11/05/19   [provider]  nystatin ointment (MYCOSTATIN) Apply 1 application topically daily as needed for irritation. 10/02/19   [provider]  pantoprazole (PROTONIX) 40 MG tablet Take 1 tablet (40 mg total) by mouth 2 (two) times daily before a meal. Patient taking differently: Take 40 mg by mouth daily.  03/01/19   Thornell Mule, MD  Skin Protectants, Misc. (CALAZIME SKIN PROTECTANT EX) Apply 1 application topically 4 (four) times daily as needed (diaper changes).    [provider]  traMADol (ULTRAM) 50 MG tablet Take 1 tablet (50 mg total) by mouth every 6 (six) hours as needed. 11/12/19 11/11/20  Hessie Knows, MD  traMADol (ULTRAM) 50 MG tablet Take 1 tablet (50 mg total) by mouth every 6 (six) hours as needed. 11/12/19 11/11/20  Hessie Knows, MD    Allergies Celecoxib, Hydrocodone-acetaminophen, Meloxicam, Morphine sulfate er beads, Naproxen, Rofecoxib, Sulfa antibiotics, Ciprofloxacin, Nickel, and Tramadol-acetaminophen  Family History  Problem Relation Age of Onset  . Bone cancer Mother   . COPD Father     Social History Social History   Tobacco Use  .  Smoking status: Former Smoker    Packs/day: 2.00    Years: 30.00    Pack years: 60.00    Types: Cigarettes    Quit date: 10/02/1990    Years since quitting: 29.3  . Smokeless tobacco: Never Used  Vaping Use  . Vaping Use: Never used  Substance Use Topics  . Alcohol use: No    Alcohol/week: 0.0 standard drinks  . Drug use: No    Review of Systems Constitutional: No fever/chills Eyes: No visual changes. ENT: No sore throat. Cardiovascular: Denies chest pain. Respiratory: Denies shortness of breath. Gastrointestinal: No abdominal pain.  No nausea, no vomiting.  No diarrhea.   Genitourinary: Negative for dysuria. Musculoskeletal: Negative for back pain. Skin: Positive for wounds to right ankle and right knee. Neurological: Negative for headaches, focal weakness or numbness.  ____________________________________________   PHYSICAL EXAM:  VITAL SIGNS: ED Triage Vitals  Enc Vitals Group     BP 01/22/20 1855 (!) 108/59     Pulse Rate 01/22/20 1855 (!) 121     Resp 01/22/20 1855 (!) 22     Temp 01/22/20 1855 98.1  F (36.7 C)     Temp Source 01/22/20 1855 Oral     SpO2 01/22/20 1855 100 %     Weight --      Height --      Head Circumference --      Peak Flow --      Pain Score 01/22/20 1901 8   Constitutional: Alert and oriented.  Eyes: Conjunctivae are normal.  ENT      Head: Normocephalic and atraumatic.      Nose: No congestion/rhinnorhea.      Mouth/Throat: Mucous membranes are moist.      Neck: No stridor. Hematological/Lymphatic/Immunilogical: No cervical lymphadenopathy. Cardiovascular: Normal rate, regular rhythm.  No murmurs, rubs, or gallops.  Respiratory: Normal respiratory effort without tachypnea nor retractions. Breath sounds are clear and equal bilaterally. No wheezes/rales/rhonchi. Gastrointestinal: Soft and non tender. No rebound. No guarding.  Genitourinary: Deferred Musculoskeletal: Normal range of motion in all extremities. No lower extremity  edema. Neurologic:  Normal speech and language. No gross focal neurologic deficits are appreciated.  Skin:  Positive for wound near right medial malleolus, positive for wound below right knee. Psychiatric: Mood and affect are normal. Speech and behavior are normal. Patient exhibits appropriate insight and judgment.  ____________________________________________    LABS (pertinent positives/negatives)  Lactic acid 2.7 CMP wnl except glu 132, ca 10.5, alb 3.1 CBC wbc 15.5, hgb 10.0, plt 797  ____________________________________________   EKG  None  ____________________________________________    RADIOLOGY  Right tib fib Chronic fracture deformity of right mid fibula and postsurgical changes to ankle Some soft tissue swelling  Right knee S/p right knee replacement with age indeterminate fracture. Large joint effusion.  ____________________________________________   PROCEDURES  Procedures  CRITICAL CARE Performed by: Nance Pear   Total critical care time: 30 minutes  Critical care time was exclusive of separately billable procedures and treating other patients.  Critical care was necessary to treat or prevent imminent or life-threatening deterioration.  Critical care was time spent personally by me on the following activities: development of treatment plan with patient and/or surrogate as well as nursing, discussions with consultants, evaluation of patient's response to treatment, examination of patient, obtaining history from patient or surrogate, ordering and performing treatments and interventions, ordering and review of laboratory studies, ordering and review of radiographic studies, pulse oximetry and re-evaluation of patient's condition.  ____________________________________________   INITIAL IMPRESSION / ASSESSMENT AND PLAN / ED COURSE  Pertinent labs & imaging results that were available during my care of the patient were reviewed by me and considered  in my medical decision making (see chart for details).   Patient presented to the emergency department today from wound care center because of concerns for new wound to the right knee.  She does have some spontaneous drainage from this wound.  Patient's blood work is concerning for infection with elevated white count and lactic acidosis. Because of this patient was started on broad spectrum antibiotics and iv fluids. Discussed with hospitalist for admission.   ____________________________________________   FINAL CLINICAL IMPRESSION(S) / ED DIAGNOSES  Final diagnoses:  Wound infection  Abscess     Note: This dictation was prepared with Dragon dictation. Any transcriptional errors that result from this process are unintentional     Nance Pear, MD 01/22/20 2205

## 2020-01-22 NOTE — Progress Notes (Signed)
PHARMACY -  BRIEF ANTIBIOTIC NOTE   Pharmacy has received consult(s) for Vancomycin from an ED provider.  The patient's profile has been reviewed for ht/wt/allergies/indication/available labs.    One time order(s) placed for Vancomycin 1000mg   Further antibiotics/pharmacy consults should be ordered by admitting physician if indicated.                       Thank you, Ena Dawley 01/22/2020  9:41 PM

## 2020-01-22 NOTE — ED Notes (Signed)
Patient transported to X-ray 

## 2020-01-22 NOTE — H&P (Signed)
[place]   PATIENT NAME: Jasmine Morrow    MR#:  [id number]  DATE OF BIRTH:  [birth year]  DATE OF ADMISSION:  01/22/2020  PRIMARY CARE PHYSICIAN: Danella Penton, MD   REQUESTING/REFERRING PHYSICIAN: Phineas Semen, MD  CHIEF COMPLAINT:   Chief Complaint  Patient presents with  . Wound Infection    HISTORY OF PRESENT ILLNESS:  Jasmine Morrow  is a 74 y.o. Caucasian female with a known history of asthma, hypertension, obstructive sleep apnea, GERD and anxiety, who presented to the emergency room with acute onset of right ankle swelling and pain with draining wound which has developed over the last 5 days.  She was seen at the wound clinic today and had drainage of significant amount of pus per her report with subsequent referral to the emergency room for further management.  The patient has had a complication from right ankle surgery and hardware infection and underwent several debridement surgeries and January, February and October.  She denies any fever or chills.  No nausea or vomiting or abdominal pain.  No chest pain or palpitations.  Upon presentation to the emergency room, blood pressure was 108/59 with a heart rate of 121 and respiratory rate of 22.  Labs revealed low albumin of 3.1 with otherwise unremarkable CMP, lactic acid of 2.7 and later 2.3 and CBC revealed cytosis 15.5 with neutrophilia and anemia.  COVID-19 PCR and influenza antigens came back negative.  Blood cultures were drawn.  Right knee x-ray showed, 1. Right knee replacement with a nondisplaced fracture the of indeterminate age adjacent to the medial aspect of the distal right femoral prosthesis. 2. Large joint effusion. Right tibia/fibula x-ray showed: 1. Chronic fracture deformity of the mid right fibula with chronic posttraumatic and postsurgical changes involving the right ankle, as described above. 2. Moderate severity diffuse soft tissue swelling.  The patient was given IV Rocephin and  vancomycin, 1 L bolus of IV lactated Ringer followed 150 mL/h and 1 L bolus of IV normal saline.  She will be admitted to a medical monitored bed for further evaluation and management.  PAST MEDICAL HISTORY:   Past Medical History:  Diagnosis Date  . Anxiety   . Asthma   . GERD (gastroesophageal reflux disease)   . Hypertension   . Seasonal allergies   . Sleep apnea     PAST SURGICAL HISTORY:   Past Surgical History:  Procedure Laterality Date  . CESAREAN SECTION    . DILATION AND CURETTAGE OF UTERUS    . ESOPHAGOGASTRODUODENOSCOPY N/A 02/22/2019   Procedure: ESOPHAGOGASTRODUODENOSCOPY (EGD);  Surgeon: Toledo, Boykin Nearing, MD;  Location: [facility];  Service: Gastroenterology;  Laterality: N/A;  . FLEXIBLE SIGMOIDOSCOPY N/A 02/22/2019   Procedure: FLEXIBLE SIGMOIDOSCOPY;  Surgeon: Toledo, Boykin Nearing, MD;  Location: [facility];  Service: Gastroenterology;  Laterality: N/A;  . HARDWARE REMOVAL Right 11/12/2019   Procedure: Right ankle hardware removal;  Surgeon: Kennedy Bucker, MD;  Location: [facility];  Service: Orthopedics;  Laterality: Right;  . ORIF ANKLE FRACTURE Right 03/14/2019   Procedure: OPEN REDUCTION INTERNAL FIXATION (ORIF) ANKLE FRACTURE, MEDIAL MALLEOLUS;  Surgeon: Kennedy Bucker, MD;  Location: [facility];  Service: Orthopedics;  Laterality: Right;  . ORIF ANKLE FRACTURE Right 02/08/2019   Procedure: OPEN REDUCTION INTERNAL FIXATION (ORIF) ANKLE FRACTURE, post malleolus;  Surgeon: Kennedy Bucker, MD;  Location: [facility];  Service: Orthopedics;  Laterality: Right;  . REPLACEMENT TOTAL KNEE Bilateral 2008   2009  . SYNDESMOSIS REPAIR Right 02/08/2019  Procedure: SYNDESMOSIS REPAIR;  Surgeon: Kennedy Bucker, MD;  Location: [facility];  Service: Orthopedics;  Laterality: Right;  . TEE WITHOUT CARDIOVERSION N/A 02/26/2019   Procedure: TRANSESOPHAGEAL ECHOCARDIOGRAM (TEE);  Surgeon: Lamar Blinks, MD;  Location: [facility];  Service: Cardiovascular;  Laterality: N/A;  . 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 Smoking status: Former Smoker    Packs/day: 2.00    Years: 30.00    Pack years: 60.00    Types: Cigarettes    Quit date: 10/02/1990    Years since quitting: 29.3  . Smokeless tobacco: Never Used  Substance Use Topics  . Alcohol use: No    Alcohol/week: 0.0 standard drinks    FAMILY HISTORY:   Family History  Problem Relation Age of Onset  . Bone cancer Mother   . COPD Father     DRUG ALLERGIES:   Allergies  Allergen Reactions  . Celecoxib Nausea Only  . Hydrocodone-Acetaminophen Nausea And Vomiting  . Meloxicam Nausea Only  . Morphine Sulfate Er Beads Itching  . Naproxen Swelling  . Rofecoxib Nausea Only  . Sulfa Antibiotics Other (See Comments)    Reaction: unknown  . Ciprofloxacin Rash  . Nickel Rash  . Tramadol-Acetaminophen Nausea Only and Rash    Per pt she takes this when PT comes only makes her feel "Loopy" 11/08/2019    REVIEW OF SYSTEMS:   ROS As per history of present illness. All pertinent systems were reviewed above. Constitutional, HEENT, cardiovascular, respiratory, GI, GU, musculoskeletal, neuro, psychiatric, endocrine, integumentary and hematologic systems were reviewed and are otherwise negative/unremarkable except for positive findings mentioned above in the HPI.   MEDICATIONS AT HOME:   Prior to Admission medications   Medication Sig Start Date End Date Taking? Authorizing Provider  albuterol (PROVENTIL HFA;VENTOLIN HFA) 108 (90 Base) MCG/ACT inhaler Inhale 2 puffs into the lungs every 4 (four) hours as needed. Patient taking differently: Inhale 2 puffs into the lungs every 4 (four) hours as needed for wheezing or shortness of breath.  05/09/16   Merwyn Katos, MD  aspirin EC 81 MG tablet Take 81 mg by mouth daily. Swallow whole.    [provider]  Calcium-Vitamin D 600-200 MG-UNIT per tablet Take 2 tablets by mouth daily.      [provider]  carvedilol (COREG) 3.125 MG tablet Take 3.125 mg by mouth 2 (two) times daily. 12/07/18   [provider]  cetirizine (ZYRTEC) 10 MG tablet Take 10 mg by mouth daily.    [provider]  escitalopram (LEXAPRO) 10 MG tablet Take 10 mg by mouth daily. 10/17/19   [provider]  hydroxypropyl methylcellulose / hypromellose (ISOPTO TEARS / GONIOVISC) 2.5 % ophthalmic solution Place 1 drop into both eyes 3 (three) times daily as needed for dry eyes.     [provider]  Multiple Vitamin (MULTIVITAMIN WITH MINERALS) TABS tablet Take 1 tablet by mouth daily.    [provider]  Multiple Vitamins-Minerals (PRESERVISION AREDS 2 PO) Take 1 tablet by mouth 2 (two) times daily.    [provider]  nitrofurantoin, macrocrystal-monohydrate, (MACROBID) 100 MG capsule Take 100 mg by mouth 2 (two) times daily. 11/05/19   [provider]  nystatin ointment (MYCOSTATIN) Apply 1 application topically daily as needed for irritation. 10/02/19   [provider]  pantoprazole (PROTONIX) 40 MG tablet Take 1 tablet (40 mg total)  by mouth 2 (two) times daily before a meal. Patient taking differently: Take 40 mg by mouth daily.  03/01/19   Thornell Mule, MD  Skin Protectants, Misc. (CALAZIME SKIN PROTECTANT EX) Apply 1 application topically 4 (four) times daily as needed (diaper changes).    [provider]  traMADol (ULTRAM) 50 MG tablet Take 1 tablet (50 mg total) by mouth every 6 (six) hours as needed. 11/12/19 11/11/20  Hessie Knows, MD  traMADol (ULTRAM) 50 MG tablet Take 1 tablet (50 mg total) by mouth every 6 (six) hours as needed. 11/12/19 11/11/20  Hessie Knows, MD      VITAL SIGNS:  Blood pressure (!) 108/59, pulse (!) 118, temperature 98.1 F (36.7 C), temperature source Oral, resp. rate 20, SpO2 100 %.  PHYSICAL EXAMINATION:  Physical Exam  GENERAL:  74 y.o.-year-old patient lying in the bed with no acute  distress.  EYES: Pupils equal, round, reactive to light and accommodation. No scleral icterus. Extraocular muscles intact.  HEENT: Head atraumatic, normocephalic. Oropharynx and nasopharynx clear.  NECK:  Supple, no jugular venous distention. No thyroid enlargement, no tenderness.  LUNGS: Normal breath sounds bilaterally, no wheezing, rales,rhonchi or crepitation. No use of accessory muscles of respiration.  CARDIOVASCULAR: Regular rate and rhythm, S1, S2 normal. No murmurs, rubs, or gallops.  ABDOMEN: Soft, nondistended, nontender. Bowel sounds present. No organomegaly or mass.  EXTREMITIES: Bilateral lower extremity 1+ pitting edema with no cyanosis, or clubbing.  NEUROLOGIC: Cranial nerves II through XII are intact. Muscle strength 5/5 in all extremities. Sensation intact. Gait not checked.  PSYCHIATRIC: The patient is alert and oriented x 3.  Normal affect and good eye contact. SKIN: Right knee skin erythema with tenderness and induration minimal fluctuation and purulent drainage.  Right ankle open wound with surrounding erythema and tenderness and red granulation tissue at the base.    LABORATORY PANEL:   CBC Recent Labs  Lab 01/22/20 1915  WBC 15.5*  HGB 10.0*  HCT 33.2*  PLT 797*   ------------------------------------------------------------------------------------------------------------------  Chemistries  Recent Labs  Lab 01/22/20 1915  NA 137  K 4.1  CL 102  CO2 22  GLUCOSE 132*  BUN 13  CREATININE 0.86  CALCIUM 10.5*  AST 20  ALT 11  ALKPHOS 124  BILITOT 0.6   ------------------------------------------------------------------------------------------------------------------  Cardiac Enzymes No results for input(s): TROPONINI in the last 168 hours. ------------------------------------------------------------------------------------------------------------------  RADIOLOGY:  DG Tibia/Fibula Right  Result Date: 01/22/2020 CLINICAL DATA:  Nonhealing right  foot wound. EXAM: RIGHT TIBIA AND FIBULA - 2 VIEW COMPARISON:  March 14, 2019 FINDINGS: There is no evidence of an acute fracture or dislocation. A chronic fracture deformity is seen involving the shaft of the mid right fibula. Radiopaque fixation plates and screws are also seen involving the distal right tibia, right medial malleolus and right lateral malleolus. A chronic deformity is seen involving the right ankle mortise and adjacent portion of the dome of the talus. Moderate severity diffuse soft tissue swelling is noted. IMPRESSION: 1. Chronic fracture deformity of the mid right fibula with chronic posttraumatic and postsurgical changes involving the right ankle, as described above. 2. Moderate severity diffuse soft tissue swelling. Electronically Signed   By: Virgina Norfolk M.D.   On: 01/22/2020 19:54   DG Knee Complete 4 Views Right  Result Date: 01/22/2020 CLINICAL DATA:  Anterior right knee wound. 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 A curvilinear cortical lucency is seen within the  medial aspect of the distal right femur, adjacent to the distal right femoral prosthesis. A chronic fracture deformity seen involving the mid right fibular shaft. There is no evidence of dislocation. A large joint effusion is seen. Subsequent marked severity anterior and medial soft tissue swelling is seen. No soft tissue air is identified. IMPRESSION: 1. Right knee replacement with a nondisplaced fracture the of indeterminate age adjacent to the medial aspect of the distal right femoral prosthesis. 2. Large joint effusion. Electronically Signed   By: Virgina Norfolk M.D.   On: 01/22/2020 19:50      IMPRESSION AND PLAN:   1.  Right knee abscess with right knee effusion. -The patient will be admitted to a medical monitored bed. -We will continue antibiotic therapy with IV vancomycin and Rocephin. -We will follow wound culture and  obtain blood cultures. -Pain management will be provided. -Orthopedic consultation will be obtained. -Dr. Marry Guan will be consulted and notified in a.m.  2.  Essential hypertension -We will continue Coreg.  3.  Depression. -We will continue Lexapro.  4.  GERD. -We will continue PPI therapy.  5.  Asthma. -We will continue as needed albuterol.  6.  DVT prophylaxis. -Subcutaneous Lovenox    All the records are reviewed and case discussed with ED provider. The plan of care was discussed in details with the patient (and family). I answered all questions. The patient agreed to proceed with the above mentioned plan. Further management will depend upon hospital course.   CODE STATUS: Full code  Status is: Inpatient  Remains inpatient appropriate because:Ongoing active pain requiring inpatient pain management, Ongoing diagnostic testing needed not appropriate for outpatient work up, Unsafe d/c plan, IV treatments appropriate due to intensity of illness or inability to take PO and Inpatient level of care appropriate due to severity of illness   Dispo: The patient is from: Home              Anticipated d/c is to: Home              Anticipated d/c date is: 3 days              Patient currently is not medically stable to d/c.   TOTAL TIME TAKING CARE OF THIS PATIENT: 55 minutes.    Christel Mormon M.D on 01/22/2020 at 10:21 PM  [facility]   From 7 PM-7 AM, contact night-coverage [email or web address]  CC: Primary care physician; Rusty Aus, MD

## 2020-01-22 NOTE — ED Triage Notes (Signed)
Pt says that she was sent here by the wound care doctor who she saw today. Reports she has a non healing wound on her right foot, but now has a wound on her right (anterior) knee that has purulent drainage. Denies fevers.

## 2020-01-23 ENCOUNTER — Encounter: Payer: Self-pay | Admitting: Family Medicine

## 2020-01-23 ENCOUNTER — Other Ambulatory Visit
Admission: RE | Admit: 2020-01-23 | Discharge: 2020-01-23 | Disposition: A | Discharge status: Home / Self Care | Payer: Medicare Other | Source: Ambulatory Visit | Attending: Internal Medicine | Admitting: Internal Medicine

## 2020-01-23 DIAGNOSIS — T8453XA Infection and inflammatory reaction due to internal right knee prosthesis, initial encounter: Principal | ICD-10-CM

## 2020-01-23 DIAGNOSIS — D649 Anemia, unspecified: Secondary | ICD-10-CM

## 2020-01-23 DIAGNOSIS — L0889 Other specified local infections of the skin and subcutaneous tissue: Secondary | ICD-10-CM

## 2020-01-23 DIAGNOSIS — L02415 Cutaneous abscess of right lower limb: Secondary | ICD-10-CM | POA: Diagnosis not present

## 2020-01-23 DIAGNOSIS — B958 Unspecified staphylococcus as the cause of diseases classified elsewhere: Secondary | ICD-10-CM

## 2020-01-23 DIAGNOSIS — Z8619 Personal history of other infectious and parasitic diseases: Secondary | ICD-10-CM

## 2020-01-23 LAB — IRON AND TIBC
Iron: 18 ug/dL — ABNORMAL LOW (ref 28–170)
Saturation Ratios: 8 % — ABNORMAL LOW (ref 10.4–31.8)
TIBC: 224 ug/dL — ABNORMAL LOW (ref 250–450)
UIBC: 206 ug/dL

## 2020-01-23 LAB — HEMOGLOBIN AND HEMATOCRIT, BLOOD
HCT: 24.8 % — ABNORMAL LOW (ref 36.0–46.0)
Hemoglobin: 7.3 g/dL — ABNORMAL LOW (ref 12.0–15.0)

## 2020-01-23 LAB — CBC
HCT: 25.1 % — ABNORMAL LOW (ref 36.0–46.0)
Hemoglobin: 7.7 g/dL — ABNORMAL LOW (ref 12.0–15.0)
MCH: 21.4 pg — ABNORMAL LOW (ref 26.0–34.0)
MCHC: 30.7 g/dL (ref 30.0–36.0)
MCV: 69.7 fL — ABNORMAL LOW (ref 80.0–100.0)
Platelets: 668 10*3/uL — ABNORMAL HIGH (ref 150–400)
RBC: 3.6 MIL/uL — ABNORMAL LOW (ref 3.87–5.11)
RDW: 16 % — ABNORMAL HIGH (ref 11.5–15.5)
WBC: 9.9 10*3/uL (ref 4.0–10.5)
nRBC: 0 % (ref 0.0–0.2)

## 2020-01-23 LAB — BASIC METABOLIC PANEL
Anion gap: 6 (ref 5–15)
BUN: 12 mg/dL (ref 8–23)
CO2: 26 mmol/L (ref 22–32)
Calcium: 9.8 mg/dL (ref 8.9–10.3)
Chloride: 108 mmol/L (ref 98–111)
Creatinine, Ser: 0.83 mg/dL (ref 0.44–1.00)
GFR, Estimated: >60 mL/min (ref >60)
Glucose, Bld: 102 mg/dL — ABNORMAL HIGH (ref 70–99)
Potassium: 3.8 mmol/L (ref 3.5–5.1)
Sodium: 140 mmol/L (ref 135–145)

## 2020-01-23 LAB — FERRITIN: Ferritin: 66 ng/mL (ref 11–307)

## 2020-01-23 LAB — HEMOGLOBIN: Hemoglobin: 8.3 g/dL — ABNORMAL LOW (ref 12.0–15.0)

## 2020-01-23 LAB — HEMATOCRIT: HCT: 26.7 % — ABNORMAL LOW (ref 36.0–46.0)

## 2020-01-23 LAB — PREPARE RBC (CROSSMATCH)

## 2020-01-23 LAB — C-REACTIVE PROTEIN: CRP: 5.9 mg/dL — ABNORMAL HIGH (ref <1.0)

## 2020-01-23 LAB — SEDIMENTATION RATE: Sed Rate: 107 mm/hr — ABNORMAL HIGH (ref 0–30)

## 2020-01-23 MED ORDER — INFLUENZA VAC A&B SA ADJ QUAD 0.5 ML IM PRSY
0.5000 mL | PREFILLED_SYRINGE | INTRAMUSCULAR | Status: DC
  Filled 2020-01-23: qty 0.5

## 2020-01-23 MED ORDER — SODIUM CHLORIDE 0.9 % IV BOLUS
500.0000 mL | Freq: Once | INTRAVENOUS | Status: AC
  Administered 2020-01-23: 500 mL via INTRAVENOUS

## 2020-01-23 MED ORDER — SODIUM CHLORIDE 0.9% IV SOLUTION: Freq: Once | INTRAVENOUS | Status: AC

## 2020-01-23 MED ORDER — HYDROMORPHONE HCL 1 MG/ML IJ SOLN: 1.0000 mg | INTRAMUSCULAR | Status: DC | PRN

## 2020-01-23 MED ORDER — SODIUM CHLORIDE 0.9 % IV SOLN
2.0000 g | INTRAVENOUS | Status: DC
  Administered 2020-01-23 – 2020-01-26 (×4): 2 g via INTRAVENOUS
  Filled 2020-01-23: qty 2
  Filled 2020-01-23: qty 20
  Filled 2020-01-23 (×3): qty 2

## 2020-01-23 MED ORDER — VANCOMYCIN HCL IN DEXTROSE 1-5 GM/200ML-% IV SOLN
1000.0000 mg | Freq: Two times a day (BID) | INTRAVENOUS | Status: DC
  Administered 2020-01-23: 1000 mg via INTRAVENOUS
  Filled 2020-01-23 (×2): qty 200

## 2020-01-23 MED ORDER — VANCOMYCIN HCL 750 MG/150ML IV SOLN
750.0000 mg | Freq: Two times a day (BID) | INTRAVENOUS | Status: DC
  Administered 2020-01-23 – 2020-01-28 (×10): 750 mg via INTRAVENOUS
  Filled 2020-01-23 (×12): qty 150

## 2020-01-23 NOTE — Progress Notes (Signed)
Pharmacy Antibiotic Note  Jasmine Morrow is a 74 y.o. female admitted on 01/22/2020 with cellulitis. The patient has had a complication from right ankle surgery and hardware infection and underwent several debridement surgeries and January, February and October. 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 Pharmacy has been consulted for vancomycin dosing. Her renal function is stable at what appears to be her baseline level.  Plan: adjust vancomycin dose to 750 mg IV every 12 hours  Ke: 0.057 h-1  T1/2: 12.2 h  Css (calculated): 23.8 / 12.8 mcg/mL  Levels as clinically indicated  Daily renal function assessment while on IV vancomycin  Height: 5\' 9"  (175.3 cm) Weight: 88.5 kg (195 lb 1.7 oz) IBW/kg (Calculated) : 66.2  Temp (24hrs), Avg:98.2 F (36.8 C), Min:98.1 F (36.7 C), Max:98.4 F (36.9 C)  Recent Labs  Lab 01/22/20 1915 01/22/20 2202 01/23/20 0540  WBC 15.5*  --  9.9  CREATININE 0.86  --  0.83  LATICACIDVEN 2.7* 2.3*  --     Estimated Creatinine Clearance: 71.6 mL/min (by C-G formula based on SCr of 0.83 mg/dL).    Allergies  Allergen Reactions  . Celecoxib Nausea Only  . Hydrocodone-Acetaminophen Nausea And Vomiting  . Meloxicam Nausea Only  . Morphine Sulfate Er Beads Itching  . Naproxen Swelling  . Rofecoxib Nausea Only  . Sulfa Antibiotics Other (See Comments)    Reaction: unknown  . Ciprofloxacin Rash  . Nickel Rash  . Tramadol-Acetaminophen Nausea Only and Rash    Per pt she takes this when PT comes only makes her feel "Loopy" 11/08/2019    Antimicrobials this admission: ceftriaxone 12/22  >>  vancomycin 12/22  >>   Microbiology results: 12/22 BCx: NG < 12 hours 12/22 SARS CoV-2: negative 12/22 influenza A/B: negative  Thank you for allowing pharmacy to be a part of this patient's care.  Dallie Piles 01/23/2020 9:25 AM

## 2020-01-23 NOTE — Progress Notes (Signed)
Secure chat to Dr. Dwyane Dee: routine vitals obtained at this time, BP 96/45, patient asymptomatic.

## 2020-01-23 NOTE — Progress Notes (Signed)
Pharmacy Antibiotic Note  Jasmine Morrow is a 74 y.o. female admitted on 01/22/2020 with cellulitis.  Pharmacy has been consulted for Vancomycin dosing.  Plan: Vancomycin 1gm IV q12hrs (per nomogram)  Height: 5\' 9"  (175.3 cm) Weight: 88.5 kg (195 lb 1.7 oz) IBW/kg (Calculated) : 66.2  Temp (24hrs), Avg:98.1 F (36.7 C), Min:98.1 F (36.7 C), Max:98.1 F (36.7 C)  Recent Labs  Lab 01/22/20 1915 01/22/20 2202  WBC 15.5*  --   CREATININE 0.86  --   LATICACIDVEN 2.7* 2.3*    Estimated Creatinine Clearance: 69.1 mL/min (by C-G formula based on SCr of 0.86 mg/dL).    Allergies  Allergen Reactions  . Celecoxib Nausea Only  . Hydrocodone-Acetaminophen Nausea And Vomiting  . Meloxicam Nausea Only  . Morphine Sulfate Er Beads Itching  . Naproxen Swelling  . Rofecoxib Nausea Only  . Sulfa Antibiotics Other (See Comments)    Reaction: unknown  . Ciprofloxacin Rash  . Nickel Rash  . Tramadol-Acetaminophen Nausea Only and Rash    Per pt she takes this when PT comes only makes her feel "Loopy" 11/08/2019    Antimicrobials this admission:   >>    >>   Dose adjustments this admission:   Microbiology results:  BCx:   UCx:    Sputum:    MRSA PCR:   Thank you for allowing pharmacy to be a part of this patient's care.  Hart Robinsons A 01/23/2020 3:32 AM

## 2020-01-23 NOTE — Consult Note (Signed)
ORTHOPAEDIC CONSULTATION  PATIENT NAME: Jasmine Morrow DOB: 06-Apr-1945  MRN: [id number]  REQUESTING PHYSICIAN: Shawna Clamp, MD  Chief Complaint: Right knee infection, right ankle wound  HPI: Jasmine Morrow is a 74 y.o. female who was hospitalized last night due to drainage from the right knee. She had been seen in the [facility] earlier in the day for a chronic right ankle wound, and the wound care physician "probed" the right knee with resultant purulent drainage. Dr. Janalyn Rouse note was not available for review today. Cultures of the drainage were obtained and at the time of this evaluation were "no growth" in 12 hours.  The patient has a complicated past history. 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 She had done extremely well with both knees. On February 07, 2019 the patient sustained a fall resulting in a right trimalleolar ankle fracture. She underwent open reduction internal fixation of the right lateral malleolus and repair of the syndesmosis injury with subsequent delayed fixation of the medial malleolus fracture on March 14, 2019 as per Dr. Gordy Levan. In the interim, the patient was hospitalized with MSSA bacteremia and sepsis requiring ICU admission and intubation. She was on an extended course of antibiotics. She was subsequently evaluated by Dr. Gordy Levan in October with breakdown of the medial wound. She was returned to the operating room for removal of the syndesmosis anchors. She has had persistent difficulty with the medial wound healing and was referred to the Wound Care clinic. Recent cultures of the wound were consistent with MSSA.  The patient believes she may have abraded the right knee against her hospital bed at home. She has had some persistent drainage since the site was probed In the [facility]. She denies any fevers or chills.  Past Medical History:  Diagnosis Date  . Anxiety   . Asthma   .  GERD (gastroesophageal reflux disease)   . Hypertension   . Seasonal allergies   . Sleep apnea    Past Surgical History:  Procedure Laterality Date  . CESAREAN SECTION    . DILATION AND CURETTAGE OF UTERUS    . ESOPHAGOGASTRODUODENOSCOPY N/A 02/22/2019   Procedure: ESOPHAGOGASTRODUODENOSCOPY (EGD);  Surgeon: Toledo, Benay Pike, MD;  Location: [facility] END[facility];  Service: Gastroenterology;  Laterality: N/A;  . FLEXIBLE SIGMOIDOSCOPY N/A 02/22/2019   Procedure: FLEXIBLE SIGMOIDOSCOPY;  Surgeon: Toledo, Benay Pike, MD;  Location: [facility] ENDOSCOPY;  Service: Gastroenterology;  Laterality: N/A;  . HARDWARE REMOVAL Right 11/12/2019   Procedure: Right ankle hardware removal;  Surgeon: Hessie Knows, MD;  Location: [facility] ORS;  Service: Orthopedics;  Laterality: Right;  . ORIF ANKLE FRACTURE Right 03/14/2019   Procedure: OPEN REDUCTION INTERNAL FIXATION (ORIF) ANKLE FRACTURE, MEDIAL MALLEOLUS;  Surgeon: Hessie Knows, MD;  Location: [facility] ORS;  Service: Orthopedics;  Laterality: Right;  . ORIF ANKLE FRACTURE Right 02/08/2019   Procedure: OPEN REDUCTION INTERNAL FIXATION (ORIF) ANKLE FRACTURE, post malleolus;  Surgeon: Hessie Knows, MD;  Location: [facility] OR[facility];  Service: Orthopedics;  Laterality: Right;  . REPLACEMENT TOTAL KNEE Bilateral 2008   2009  . SYNDESMOSIS REPAIR Right 02/08/2019   Procedure: SYNDESMOSIS REPAIR;  Surgeon: Hessie Knows, MD;  Location: [facility] OR[facility];  Service: Orthopedics;  Laterality: Right;  . TEE WITHOUT CARDIOVERSION N/A 02/26/2019   Procedure: TRANSESOPHAGEAL ECHOCARDIOGRAM (TEE);  Surgeon: Corey Skains, MD;  Location: [facility] ORS;  Service: Cardiovascular;  Laterality: N/A;  . 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 Marital status: Married    Spouse name: Not on file  . Number of children: Not on file  . Years of education: Not on file  . Highest education level: Not on file  Occupational History  . Not on file  Tobacco Use   . Smoking status: Former Smoker    Packs/day: 2.00    Years: 30.00    Pack years: 60.00    Types: Cigarettes    Quit date: 10/02/1990    Years since quitting: 29.3  . Smokeless tobacco: Never Used  Vaping Use  . Vaping Use: Never used  Substance and Sexual Activity  . Alcohol use: No    Alcohol/week: 0.0 standard drinks  . Drug use: No  . Sexual activity: Not on file  Other Topics Concern  . Not on file  Social History Narrative  . Not on file   Social Determinants of Health   Financial Resource Strain: Not on file  Food Insecurity: Not on file  Transportation Needs: Not on file  Physical Activity: Not on file  Stress: Not on file  Social Connections: Not on file   Family History  Problem Relation Age of Onset  . Bone cancer Mother   . COPD Father    Allergies  Allergen Reactions  . Celecoxib Nausea Only  . Hydrocodone-Acetaminophen Nausea And Vomiting  . Meloxicam Nausea Only  . Morphine Sulfate Er Beads Itching  . Naproxen Swelling  . Rofecoxib Nausea Only  . Sulfa Antibiotics Other (See Comments)    Reaction: unknown  . Ciprofloxacin Rash  . Nickel Rash  . Tramadol-Acetaminophen Nausea Only and Rash    Per pt she takes this when PT comes only makes her feel "Loopy" 11/08/2019   Prior to Admission medications   Medication Sig Start Date End Date Taking? Authorizing Provider  aspirin EC 81 MG tablet Take 81 mg by mouth daily. Swallow whole.   Yes [provider]  carvedilol (COREG) 3.125 MG tablet Take 3.125 mg by mouth 2 (two) times daily. 12/07/18  Yes [provider]  cetirizine (ZYRTEC) 10 MG tablet Take 10 mg by mouth daily.   Yes [provider]  doxycycline (MONODOX) 100 MG capsule Take 100 mg by mouth 2 (two) times daily. 01/15/20  Yes [provider]  escitalopram (LEXAPRO) 10 MG tablet Take 10 mg by mouth daily. 10/17/19  Yes [provider]  Multiple Vitamins-Minerals (PRESERVISION AREDS 2 PO) Take 1 tablet  by mouth 2 (two) times daily.   Yes [provider]  nitrofurantoin, macrocrystal-monohydrate, (MACROBID) 100 MG capsule Take 100 mg by mouth 2 (two) times daily. 11/05/19  Yes [provider]  nystatin ointment (MYCOSTATIN) Apply 1 application topically daily as needed for irritation. 10/02/19  Yes [provider]  pantoprazole (PROTONIX) 40 MG tablet Take 1 tablet (40 mg total) by mouth 2 (two) times daily before a meal. Patient taking differently: Take 40 mg by mouth daily. 03/01/19  Yes Thornell Mule, MD  Skin Protectants, Misc. (CALAZIME SKIN PROTECTANT EX) Apply 1 application topically 4 (four) times daily as needed (diaper changes).   Yes [provider]  traMADol (ULTRAM) 50 MG tablet Take 1 tablet (50 mg total) by mouth every 6 (six) hours as needed. 11/12/19 11/11/20 Yes Hessie Knows, MD  albuterol (PROVENTIL HFA;VENTOLIN HFA) 108 (90 Base) MCG/ACT inhaler Inhale 2 puffs into the lungs every 4 (four) hours as needed. Patient not taking: Reported  on 01/22/2020 05/09/16   Wilhelmina Mcardle, MD  Calcium-Vitamin D 600-200 MG-UNIT per tablet Take 2 tablets by mouth daily.  Patient not taking: Reported on 01/22/2020    [provider]  hydroxypropyl methylcellulose / hypromellose (ISOPTO TEARS / GONIOVISC) 2.5 % ophthalmic solution Place 1 drop into both eyes 3 (three) times daily as needed for dry eyes.  Patient not taking: Reported on 01/22/2020    [provider]  Multiple Vitamin (MULTIVITAMIN WITH MINERALS) TABS tablet Take 1 tablet by mouth daily. Patient not taking: Reported on 01/22/2020    [provider]   DG Tibia/Fibula Right  Result Date: 01/22/2020 CLINICAL DATA:  Nonhealing right foot wound. EXAM: RIGHT TIBIA AND FIBULA - 2 VIEW COMPARISON:  March 14, 2019 FINDINGS: There is no evidence of an acute fracture or dislocation. A chronic fracture deformity is seen involving the shaft of the mid right fibula. Radiopaque  fixation plates and screws are also seen involving the distal right tibia, right medial malleolus and right lateral malleolus. A chronic deformity is seen involving the right ankle mortise and adjacent portion of the dome of the talus. Moderate severity diffuse soft tissue swelling is noted. IMPRESSION: 1. Chronic fracture deformity of the mid right fibula with chronic posttraumatic and postsurgical changes involving the right ankle, as described above. 2. Moderate severity diffuse soft tissue swelling. Electronically Signed   By: Virgina Norfolk M.D.   On: 01/22/2020 19:54   DG Knee Complete 4 Views Right  Result Date: 01/22/2020 CLINICAL DATA:  Anterior right knee wound. 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 A curvilinear cortical lucency is seen within the medial aspect of the distal right femur, adjacent to the distal right femoral prosthesis. A chronic fracture deformity seen involving the mid right fibular shaft. There is no evidence of dislocation. A large joint effusion is seen. Subsequent marked severity anterior and medial soft tissue swelling is seen. No soft tissue air is identified. IMPRESSION: 1. Right knee replacement with a nondisplaced fracture the of indeterminate age adjacent to the medial aspect of the distal right femoral prosthesis. 2. Large joint effusion. Electronically Signed   By: Virgina Norfolk M.D.   On: 01/22/2020 19:50    Positive ROS: All other systems have been reviewed and were otherwise negative with the exception of those mentioned in the HPI and as above.  Physical Exam: General: Well developed, well nourished pale female seen in no acute distress. HEENT: Atraumatic and normocephalic. Sclera are clear. Extraocular motion is intact. Oropharynx is clear with moist mucosa. Neck: Supple, nontender, good range of motion. Lungs: Clear to auscultation bilaterally. Cardiovascular: Regular rate  and rhythm with normal S1 and S2. No murmurs. No gallops or rubs. Pedal pulses are difficult to palpate bilaterally. Homans test is negative bilaterally. Mild pretibial and ankle edema. Abdomen: Soft, nontender, and nondistended. Bowel sounds are present. Skin: No lesions in the area of chief complaint Neurologic: Awake, alert, and oriented. Sensory function is grossly intact. Motor strength is felt to be 5 over 5 bilaterally. No clonus or tremor. Good motor coordination. Lymphatic: No axillary or cervical lymphadenopathy  MUSCULOSKELETAL: Examination of the right knee demonstrates well-healed surgical incision. Soft tissue swelling is noted with some mild increased warmth. There is a localized area of erythema at the inferior most aspect of the surgical scar. No drainage can be expressed although there is some drainage on the dressing. The knee is stable  to varus valgus stress. Knee range of motion was not assessed.  Examination of the left knee demonstrates a well-healed surgical incision. Some slight skin discoloration is noted laterally but no gross erythema. Knee range of motion is well-tolerated. Partnerships atrophy is noted.  Examination of the right lower leg demonstrates a 1 cm x 1 cm lesion to the medial aspect of the ankle. Mild erythema is noted. Gentle ankle range of motion is well-tolerated.  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 The severity of these findings was explained. We discussed the option of right knee arthrotomy, irrigation and debridement, placement of antibiotic beads, and IV antibiotics versus removal of the implants and placement of an antibiotic spacer. After lengthy discussion of these options we have agreed upon arthrotomy with irrigation debridement and placement of antibiotic beads. I do have concerns about the  patient's ability for wound healing given the lesion on the lower leg. We may need to involve Plastic Surgery.  Tsion Inghram P. Holley Bouche M.D.

## 2020-01-23 NOTE — Progress Notes (Signed)
PROGRESS NOTE    Jasmine Morrow  TOI:[id number] DOB: Sep 06, 1945 DOA: 01/22/2020 PCP: Rusty Aus, MD   Brief Narrative:  This 74 years old female with PMH of asthma, hypertension, obstructive sleep apnea, GERD, anxiety presents to the emergency department with acute onset of right ankle swelling and pain with draining wound which has developed over the last 5 days.  Patient was seen in the wound care clinic and was sent to the emergency department after she was found to have significant drainage from her wound.  Patient reports that she has complication from right ankle surgery and hardware infection and underwent several debridement surgeries in this year. Patient is admitted for sepsis secondary to wound infection.  Patient was started on ceftriaxone and vancomycin,  infectious disease and orthopedics consulted.  She is found to have anemia , getting blood transfusion.  Assessment & Plan:   Active Problems:   Abscess of right knee   Sepsis secondary to right knee abscess with right knee effusion: Patient presented with tachycardia, tachypnea, hypotension, lactic acid 2.7, obvious source of infection. -The patient will be admitted to a medical monitored bed. -Continue empiric antibiotics with IV vancomycin and Rocephin. -Follow blood cultures and wound cultures. -Adequate pain control. -Orthopedics Dr. Marry Guan was notified.  He will see the patient in the afternoon. -Infectious disease was consulted,  awaiting recommendation. -Patient might need surgical intervention depends upon Ortho evaluation.  2.  Essential hypertension      Hold Coreg.  Blood pressure is on soft side.  3.  Depression.    Continue Lexapro  4.  GERD.    Continue Protonix 5.  Asthma.    Continue as needed albuterol  6.  Microcytic hypochromic anemia. Baseline hemoglobin runs between 8 and 9 Hemoglobin 7.3.  Transfuse 1 PRBC follow posttransfusion CBC. Obtain iron profile.   DVT prophylaxis:  Lovenox Code Status: Full code Family Communication: No family at bedside Disposition Plan:   Status is: Inpatient  Remains inpatient appropriate because:Inpatient level of care appropriate due to severity of illness   Dispo: The patient is from: Home              Anticipated d/c is to: SNF              Anticipated d/c date is: > 3 days              Patient currently is not medically stable to d/c.  Consultants:   Orthopedics  Infectious disease  Procedures: None Antimicrobials:  Anti-infectives (From admission, onward)   Start     Dose/Rate Route Frequency Ordered Stop   01/23/20 2200  cefTRIAXone (ROCEPHIN) 2 g in sodium chloride 0.9 % 100 mL IVPB        2 g 200 mL/hr over 30 Minutes Intravenous Every 24 hours 01/23/20 0935     01/23/20 2200  vancomycin (VANCOREADY) IVPB 750 mg/150 mL        750 mg 150 mL/hr over 60 Minutes Intravenous Every 12 hours 01/23/20 0959     01/23/20 0800  vancomycin (VANCOCIN) IVPB 1000 mg/200 mL premix  Status:  Discontinued        1,000 mg 200 mL/hr over 60 Minutes Intravenous Every 12 hours 01/23/20 0331 01/23/20 0959   01/22/20 2230  vancomycin (VANCOCIN) IVPB 1000 mg/200 mL premix  Status:  Discontinued        1,000 mg 200 mL/hr over 60 Minutes Intravenous  Once 01/22/20 2220 01/23/20 0033   01/22/20 2230  ceFAZolin (ANCEF) 2 g in dextrose 5 % 100 mL IVPB  Status:  Discontinued        2 g 240 mL/hr over 30 Minutes Intravenous Every 8 hours 01/22/20 2220 01/23/20 0935   01/22/20 2145  vancomycin (VANCOCIN) IVPB 1000 mg/200 mL premix        1,000 mg 200 mL/hr over 60 Minutes Intravenous  Once 01/22/20 2139 01/22/20 2348   01/22/20 2145  cefTRIAXone (ROCEPHIN) 2 g in sodium chloride 0.9 % 100 mL IVPB        2 g 200 mL/hr over 30 Minutes Intravenous  Once 01/22/20 2139 01/22/20 2229      Subjective: Patient was seen and examined at bedside.  Overnight events noted.  Patient appears pale,  reports having pain in the right ankle and right  knee.  She still has significant edema in the right leg,  states she has been bedbound for last 1 year.  Objective: Vitals:   01/23/20 0521 01/23/20 0933 01/23/20 1216 01/23/20 1216  BP: (!) 122/54 (!) 110/45  (!) 96/45  Pulse: 87 64  70  Resp: 18 20  18   Temp: 98.4 F (36.9 C) 97.7 F (36.5 C) 98.1 F (36.7 C)   TempSrc: Oral Oral Oral   SpO2: 100% 100%  97%  Weight:      Height:        Intake/Output Summary (Last 24 hours) at 01/23/2020 1426 Last data filed at 01/23/2020 0417 Gross per 24 hour  Intake 1186.69 ml  Output --  Net 1186.69 ml   Filed Weights   01/23/20 0210  Weight: 88.5 kg    Examination:  General exam: Appears calm and comfortable, not in any acute distress. Respiratory system: Clear to auscultation. Respiratory effort normal. Cardiovascular system: S1 & S2 heard, RRR. No JVD, murmurs, rubs, gallops or clicks. No pedal edema. Gastrointestinal system: Abdomen is nondistended, soft and nontender. No organomegaly or masses felt. Normal bowel sounds heard. Central nervous system: Alert and oriented. No focal neurological deficits. Extremities: Right leg swollen,  surgical scar noted, open wound with drainage noted. Skin: No rashes, lesions or ulcers Psychiatry: Judgement and insight appear normal. Mood & affect appropriate.     Data Reviewed: I have personally reviewed following labs and imaging studies  CBC: Recent Labs  Lab 01/22/20 1915 01/23/20 0540 01/23/20 0824  WBC 15.5* 9.9  --   NEUTROABS 12.8*  --   --   HGB 10.0* 7.7* 7.3*  HCT 33.2* 25.1* 24.8*  MCV 70.2* 69.7*  --   PLT 797* 668*  --    Basic Metabolic Panel: Recent Labs  Lab 01/22/20 1915 01/23/20 0540  NA 137 140  K 4.1 3.8  CL 102 108  CO2 22 26  GLUCOSE 132* 102*  BUN 13 12  CREATININE 0.86 0.83  CALCIUM 10.5* 9.8   GFR: Estimated Creatinine Clearance: 71.6 mL/min (by C-G formula based on SCr of 0.83 mg/dL). Liver Function Tests: Recent Labs  Lab 01/22/20 1915   AST 20  ALT 11  ALKPHOS 124  BILITOT 0.6  PROT 7.8  ALBUMIN 3.1*   No results for input(s): LIPASE, AMYLASE in the last 168 hours. No results for input(s): AMMONIA in the last 168 hours. Coagulation Profile: Recent Labs  Lab 01/22/20 1915  INR 1.2   Cardiac Enzymes: No results for input(s): CKTOTAL, CKMB, CKMBINDEX, TROPONINI in the last 168 hours. BNP (last 3 results) No results for input(s): PROBNP in the last 8760 hours. HbA1C: No results  for input(s): HGBA1C in the last 72 hours. CBG: No results for input(s): GLUCAP in the last 168 hours. Lipid Profile: No results for input(s): CHOL, HDL, LDLCALC, TRIG, CHOLHDL, LDLDIRECT in the last 72 hours. Thyroid Function Tests: No results for input(s): TSH, T4TOTAL, FREET4, T3FREE, THYROIDAB in the last 72 hours. Anemia Panel: No results for input(s): VITAMINB12, FOLATE, FERRITIN, TIBC, IRON, RETICCTPCT in the last 72 hours. Sepsis Labs: Recent Labs  Lab 01/22/20 1915 01/22/20 2202  LATICACIDVEN 2.7* 2.3*    Recent Results (from the past 240 hour(s))  Culture, blood (Routine x 2)     Status: None (Preliminary result)   Collection Time: 01/22/20  7:15 PM   Specimen: BLOOD  Result Value Ref Range Status   Specimen Description BLOOD BLOOD RIGHT HAND  Final   Special Requests   Final    BOTTLES DRAWN AEROBIC ONLY Blood Culture results may not be optimal due to an inadequate volume of blood received in culture bottles   Culture   Final    NO GROWTH < 12 HOURS Performed at [facility], [address] [place], [place] [address]    Report Status PENDING  Incomplete  Culture, blood (Routine x 2)     Status: None (Preliminary result)   Collection Time: 01/22/20 10:02 PM   Specimen: BLOOD  Result Value Ref Range Status   Specimen Description BLOOD RIGHT ANTECUBITAL  Final   Special Requests   Final    BOTTLES DRAWN AEROBIC AND ANAEROBIC Blood Culture adequate volume   Culture   Final    NO GROWTH < 12  HOURS Performed at [facility], [address], [place],  [address]    Report Status PENDING  Incomplete  Resp Panel by RT-PCR (Flu A&B, Covid) Nasopharyngeal Swab     Status: None   Collection Time: 01/22/20 10:02 PM   Specimen: Nasopharyngeal Swab; Nasopharyngeal(NP) swabs in vial transport medium  Result Value Ref Range Status   SARS Coronavirus 2 by RT PCR NEGATIVE NEGATIVE Final    Comment: (NOTE) SARS-CoV-2 target nucleic acids are NOT DETECTED.  The SARS-CoV-2 RNA is generally detectable in upper respiratory specimens during the acute phase of infection. The lowest concentration of SARS-CoV-2 viral copies this assay can detect is 138 copies/mL. A negative result does not preclude SARS-Cov-2 infection and should not be used as the sole basis for treatment or other patient management decisions. A negative result may occur with  improper specimen collection/handling, submission of specimen other than nasopharyngeal swab, presence of viral mutation(s) within the areas targeted by this assay, and inadequate number of viral copies(<138 copies/mL). A negative result must be combined with clinical observations, patient history, and epidemiological information. The expected result is Negative.  Fact Sheet for Patients:  EntrepreneurPulse.com.au  Fact Sheet for Healthcare Providers:  IncredibleEmployment.be  This test is no t yet approved or cleared by the [place] FDA and  has been authorized for detection and/or diagnosis of SARS-CoV-2 by FDA under an Emergency Use Authorization (EUA). This EUA will remain  in effect (meaning this test can be used) for the duration of the COVID-19 declaration under Section 564(b)(1) of the Act, 21 U.S.C.section 360bbb-3(b)(1), unless the authorization is terminated  or revoked sooner.       Influenza A by PCR NEGATIVE NEGATIVE Final   Influenza B by PCR NEGATIVE NEGATIVE Final     Comment: (NOTE) The Xpert Xpress SARS-CoV-2/FLU/RSV plus assay is intended as an aid in the diagnosis of influenza from  Nasopharyngeal swab specimens and should not be used as a sole basis for treatment. Nasal washings and aspirates are unacceptable for Xpert Xpress SARS-CoV-2/FLU/RSV testing.  Fact Sheet for Patients: EntrepreneurPulse.com.au  Fact Sheet for Healthcare Providers: IncredibleEmployment.be  This test is not yet approved or cleared by the [place] FDA and has been authorized for detection and/or diagnosis of SARS-CoV-2 by FDA under an Emergency Use Authorization (EUA). This EUA will remain in effect (meaning this test can be used) for the duration of the COVID-19 declaration under Section 564(b)(1) of the Act, 21 U.S.C. section 360bbb-3(b)(1), unless the authorization is terminated or revoked.  Performed at [facility], [address], [place], [place] [address]     Radiology Studies: DG Tibia/Fibula Right  Result Date: 01/22/2020 CLINICAL DATA:  Nonhealing right foot wound. EXAM: RIGHT TIBIA AND FIBULA - 2 VIEW COMPARISON:  March 14, 2019 FINDINGS: There is no evidence of an acute fracture or dislocation. A chronic fracture deformity is seen involving the shaft of the mid right fibula. Radiopaque fixation plates and screws are also seen involving the distal right tibia, right medial malleolus and right lateral malleolus. A chronic deformity is seen involving the right ankle mortise and adjacent portion of the dome of the talus. Moderate severity diffuse soft tissue swelling is noted. IMPRESSION: 1. Chronic fracture deformity of the mid right fibula with chronic posttraumatic and postsurgical changes involving the right ankle, as described above. 2. Moderate severity diffuse soft tissue swelling. Electronically Signed   By: Virgina Norfolk M.D.   On: 01/22/2020 19:54   DG Knee Complete 4 Views Right  Result Date:  01/22/2020 CLINICAL DATA:  Anterior right knee wound. 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 A curvilinear cortical lucency is seen within the medial aspect of the distal right femur, adjacent to the distal right femoral prosthesis. A chronic fracture deformity seen involving the mid right fibular shaft. There is no evidence of dislocation. A large joint effusion is seen. Subsequent marked severity anterior and medial soft tissue swelling is seen. No soft tissue air is identified. IMPRESSION: 1. Right knee replacement with a nondisplaced fracture the of indeterminate age adjacent to the medial aspect of the distal right femoral prosthesis. 2. Large joint effusion. Electronically Signed   By: Virgina Norfolk M.D.   On: 01/22/2020 19:50   Scheduled Meds: . sodium chloride   Intravenous Once  . calcium-vitamin D  2 tablet Oral Daily  . carvedilol  3.125 mg Oral BID  . enoxaparin (LOVENOX) injection  40 mg Subcutaneous Q24H  . escitalopram  10 mg Oral Daily  . [START ON 01/24/2020] influenza vaccine adjuvanted  0.5 mL Intramuscular Tomorrow-1000  . loratadine  10 mg Oral Daily  . multivitamin with minerals  1 tablet Oral Daily  . multivitamin-lutein  1 capsule Oral BID  . pantoprazole  40 mg Oral Daily   Continuous Infusions: . sodium chloride 100 mL/hr at 01/23/20 0417  . cefTRIAXone (ROCEPHIN)  IV    . lactated ringers    . vancomycin       LOS: 1 day    Time spent: 35 mins    Doreen Garretson, MD [facility]   If 7PM-7AM, please contact night-coverage

## 2020-01-24 ENCOUNTER — Inpatient Hospital Stay: Payer: Medicare Other | Admitting: Anesthesiology

## 2020-01-24 ENCOUNTER — Encounter
Admission: EM | Disposition: A | Discharge status: Home Health | Payer: Self-pay | Source: Home / Self Care | Attending: Family Medicine

## 2020-01-24 DIAGNOSIS — L02415 Cutaneous abscess of right lower limb: Secondary | ICD-10-CM | POA: Diagnosis not present

## 2020-01-24 HISTORY — PX: TOTAL KNEE REVISION WITH SCAR DEBRIDEMENT/PATELLA REVISION WITH POLY EXCHANGE: SHX6128

## 2020-01-24 LAB — URINALYSIS, ROUTINE W REFLEX MICROSCOPIC
Bilirubin Urine: NEGATIVE
Glucose, UA: NEGATIVE mg/dL
Hgb urine dipstick: NEGATIVE
Ketones, ur: NEGATIVE mg/dL
Nitrite: NEGATIVE
Protein, ur: NEGATIVE mg/dL
Specific Gravity, Urine: 1.005 (ref 1.005–1.030)
pH: 5 (ref 5.0–8.0)

## 2020-01-24 LAB — GRAM STAIN

## 2020-01-24 LAB — BASIC METABOLIC PANEL
Anion gap: 7 (ref 5–15)
BUN: 9 mg/dL (ref 8–23)
CO2: 25 mmol/L (ref 22–32)
Calcium: 9.6 mg/dL (ref 8.9–10.3)
Chloride: 110 mmol/L (ref 98–111)
Creatinine, Ser: 0.71 mg/dL (ref 0.44–1.00)
GFR, Estimated: >60 mL/min (ref >60)
Glucose, Bld: 94 mg/dL (ref 70–99)
Potassium: 3.9 mmol/L (ref 3.5–5.1)
Sodium: 142 mmol/L (ref 135–145)

## 2020-01-24 LAB — MAGNESIUM: Magnesium: 1.9 mg/dL (ref 1.7–2.4)

## 2020-01-24 LAB — HEMOGLOBIN AND HEMATOCRIT, BLOOD
HCT: 28.5 % — ABNORMAL LOW (ref 36.0–46.0)
Hemoglobin: 8.8 g/dL — ABNORMAL LOW (ref 12.0–15.0)

## 2020-01-24 LAB — PHOSPHORUS: Phosphorus: 3.8 mg/dL (ref 2.5–4.6)

## 2020-01-24 SURGERY — TOTAL KNEE REVISION WITH SCAR DEBRIDEMENT/PATELLA REVISION WITH POLY EXCHANGE
Anesthesia: General | Site: Knee | Laterality: Right

## 2020-01-24 MED ORDER — ONDANSETRON HCL 4 MG/2ML IJ SOLN: 4.0000 mg | Freq: Once | INTRAMUSCULAR | Status: DC | PRN

## 2020-01-24 MED ORDER — SUGAMMADEX SODIUM 200 MG/2ML IV SOLN
INTRAVENOUS | Status: DC | PRN
  Administered 2020-01-24: 200 mg via INTRAVENOUS

## 2020-01-24 MED ORDER — GENTAMICIN SULFATE 40 MG/ML IJ SOLN
INTRAMUSCULAR | Status: DC | PRN
  Administered 2020-01-24: 80 mg

## 2020-01-24 MED ORDER — EPHEDRINE SULFATE 50 MG/ML IJ SOLN
INTRAMUSCULAR | Status: DC | PRN
  Administered 2020-01-24 (×4): 5 mg via INTRAVENOUS

## 2020-01-24 MED ORDER — MAGNESIUM HYDROXIDE 400 MG/5ML PO SUSP
30.0000 mL | Freq: Every day | ORAL | Status: DC
  Administered 2020-01-25 – 2020-01-26 (×2): 30 mL via ORAL
  Filled 2020-01-24 (×3): qty 30

## 2020-01-24 MED ORDER — TRAMADOL HCL 50 MG PO TABS
50.0000 mg | ORAL_TABLET | Freq: Four times a day (QID) | ORAL | Status: DC | PRN
  Administered 2020-01-24 – 2020-01-28 (×8): 50 mg via ORAL
  Filled 2020-01-24 (×8): qty 1

## 2020-01-24 MED ORDER — SODIUM CHLORIDE 0.9 % IV SOLN: INTRAVENOUS | Status: DC

## 2020-01-24 MED ORDER — FERROUS SULFATE 325 (65 FE) MG PO TABS
325.0000 mg | ORAL_TABLET | Freq: Two times a day (BID) | ORAL | Status: DC
  Administered 2020-01-25 – 2020-01-30 (×11): 325 mg via ORAL
  Filled 2020-01-24 (×12): qty 1

## 2020-01-24 MED ORDER — PANTOPRAZOLE SODIUM 40 MG PO TBEC
40.0000 mg | DELAYED_RELEASE_TABLET | Freq: Two times a day (BID) | ORAL | Status: DC
  Administered 2020-01-24 – 2020-01-27 (×7): 40 mg via ORAL
  Filled 2020-01-24 (×7): qty 1

## 2020-01-24 MED ORDER — LACTATED RINGERS IV SOLN: INTRAVENOUS | Status: DC | PRN

## 2020-01-24 MED ORDER — PHENYLEPHRINE HCL (PRESSORS) 10 MG/ML IV SOLN
INTRAVENOUS | Status: DC | PRN
  Administered 2020-01-24 (×2): 100 ug via INTRAVENOUS
  Administered 2020-01-24: 200 ug via INTRAVENOUS
  Administered 2020-01-24 (×3): 100 ug via INTRAVENOUS

## 2020-01-24 MED ORDER — HYDROMORPHONE HCL 1 MG/ML IJ SOLN
INTRAMUSCULAR | Status: DC | PRN
  Administered 2020-01-24: .4 mg via INTRAVENOUS
  Administered 2020-01-24: .2 mg via INTRAVENOUS
  Administered 2020-01-24: .4 mg via INTRAVENOUS

## 2020-01-24 MED ORDER — NEOMYCIN-POLYMYXIN B GU 40-200000 IR SOLN
Status: DC | PRN
  Administered 2020-01-24: 4 mL

## 2020-01-24 MED ORDER — BUPIVACAINE HCL (PF) 0.25 % IJ SOLN
INTRAMUSCULAR | Status: AC
  Filled 2020-01-24: qty 30

## 2020-01-24 MED ORDER — VANCOMYCIN HCL 1000 MG IV SOLR
INTRAVENOUS | Status: DC | PRN
  Administered 2020-01-24: 1 g

## 2020-01-24 MED ORDER — HYDROMORPHONE HCL 1 MG/ML IJ SOLN
0.5000 mg | INTRAMUSCULAR | Status: DC | PRN
  Administered 2020-01-24: 1 mg via INTRAVENOUS
  Administered 2020-01-25: 0.5 mg via INTRAVENOUS
  Administered 2020-01-26: 1 mg via INTRAVENOUS
  Filled 2020-01-24 (×4): qty 1

## 2020-01-24 MED ORDER — MENTHOL 3 MG MT LOZG
1.0000 | LOZENGE | OROMUCOSAL | Status: DC | PRN
  Filled 2020-01-24: qty 9

## 2020-01-24 MED ORDER — OXYCODONE HCL 5 MG PO TABS
5.0000 mg | ORAL_TABLET | ORAL | Status: DC | PRN
  Filled 2020-01-24 (×2): qty 1

## 2020-01-24 MED ORDER — DEXAMETHASONE SODIUM PHOSPHATE 10 MG/ML IJ SOLN
INTRAMUSCULAR | Status: DC | PRN
  Administered 2020-01-24: 10 mg via INTRAVENOUS

## 2020-01-24 MED ORDER — ACETAMINOPHEN 10 MG/ML IV SOLN
INTRAVENOUS | Status: DC | PRN
  Administered 2020-01-24: 1000 mg via INTRAVENOUS

## 2020-01-24 MED ORDER — ALUM & MAG HYDROXIDE-SIMETH 200-200-20 MG/5ML PO SUSP
30.0000 mL | ORAL | Status: DC | PRN
  Administered 2020-01-30: 30 mL via ORAL
  Filled 2020-01-24: qty 30

## 2020-01-24 MED ORDER — PHENOL 1.4 % MT LIQD
1.0000 | OROMUCOSAL | Status: DC | PRN
  Filled 2020-01-24: qty 177

## 2020-01-24 MED ORDER — ONDANSETRON HCL 4 MG/2ML IJ SOLN
INTRAMUSCULAR | Status: DC | PRN
  Administered 2020-01-24: 4 mg via INTRAVENOUS

## 2020-01-24 MED ORDER — ENOXAPARIN SODIUM 30 MG/0.3ML ~~LOC~~ SOLN
30.0000 mg | Freq: Two times a day (BID) | SUBCUTANEOUS | Status: DC
  Administered 2020-01-25 – 2020-01-31 (×14): 30 mg via SUBCUTANEOUS
  Filled 2020-01-24 (×14): qty 0.3

## 2020-01-24 MED ORDER — ASCORBIC ACID 500 MG PO TABS
500.0000 mg | ORAL_TABLET | Freq: Two times a day (BID) | ORAL | Status: DC
  Administered 2020-01-25 – 2020-01-30 (×11): 500 mg via ORAL
  Filled 2020-01-24 (×14): qty 1

## 2020-01-24 MED ORDER — DEXMEDETOMIDINE HCL 200 MCG/2ML IV SOLN
INTRAVENOUS | Status: DC | PRN
  Administered 2020-01-24: 4 ug via INTRAVENOUS
  Administered 2020-01-24: 8 ug via INTRAVENOUS

## 2020-01-24 MED ORDER — LIDOCAINE HCL (CARDIAC) PF 100 MG/5ML IV SOSY
PREFILLED_SYRINGE | INTRAVENOUS | Status: DC | PRN
  Administered 2020-01-24: 80 mg via INTRAVENOUS

## 2020-01-24 MED ORDER — ACETAMINOPHEN 325 MG PO TABS
325.0000 mg | ORAL_TABLET | Freq: Four times a day (QID) | ORAL | Status: DC | PRN
  Administered 2020-01-30 – 2020-01-31 (×2): 650 mg via ORAL
  Filled 2020-01-24 (×2): qty 2

## 2020-01-24 MED ORDER — ONDANSETRON HCL 4 MG/2ML IJ SOLN
4.0000 mg | Freq: Four times a day (QID) | INTRAMUSCULAR | Status: DC | PRN
  Administered 2020-01-26 – 2020-02-01 (×7): 4 mg via INTRAVENOUS
  Filled 2020-01-24 (×7): qty 2

## 2020-01-24 MED ORDER — TRANEXAMIC ACID-NACL 1000-0.7 MG/100ML-% IV SOLN
1000.0000 mg | Freq: Once | INTRAVENOUS | Status: DC
  Filled 2020-01-24 (×2): qty 100

## 2020-01-24 MED ORDER — BUPIVACAINE LIPOSOME 1.3 % IJ SUSP
INTRAMUSCULAR | Status: AC
  Filled 2020-01-24: qty 20

## 2020-01-24 MED ORDER — ROCURONIUM BROMIDE 100 MG/10ML IV SOLN
INTRAVENOUS | Status: DC | PRN
  Administered 2020-01-24: 20 mg via INTRAVENOUS
  Administered 2020-01-24: 80 mg via INTRAVENOUS
  Administered 2020-01-24: 20 mg via INTRAVENOUS

## 2020-01-24 MED ORDER — PROPOFOL 10 MG/ML IV BOLUS
INTRAVENOUS | Status: DC | PRN
  Administered 2020-01-24: 20 mg via INTRAVENOUS
  Administered 2020-01-24: 120 mg via INTRAVENOUS

## 2020-01-24 MED ORDER — DIPHENHYDRAMINE HCL 12.5 MG/5ML PO ELIX
12.5000 mg | ORAL_SOLUTION | ORAL | Status: DC | PRN
Start: 2020-01-24 — End: 2020-02-01
  Filled 2020-01-24: qty 10

## 2020-01-24 MED ORDER — ONDANSETRON HCL 4 MG PO TABS
4.0000 mg | ORAL_TABLET | Freq: Four times a day (QID) | ORAL | Status: DC | PRN
  Administered 2020-01-28 – 2020-01-31 (×3): 4 mg via ORAL
  Filled 2020-01-24 (×3): qty 1

## 2020-01-24 MED ORDER — BISACODYL 10 MG RE SUPP: 10.0000 mg | Freq: Every day | RECTAL | Status: DC | PRN

## 2020-01-24 MED ORDER — ACETAMINOPHEN 10 MG/ML IV SOLN: 1000.0000 mg | Freq: Once | INTRAVENOUS | Status: DC | PRN

## 2020-01-24 MED ORDER — FLEET ENEMA 7-19 GM/118ML RE ENEM: 1.0000 | ENEMA | Freq: Once | RECTAL | Status: DC | PRN

## 2020-01-24 MED ORDER — FENTANYL CITRATE (PF) 100 MCG/2ML IJ SOLN: 25.0000 ug | INTRAMUSCULAR | Status: DC | PRN

## 2020-01-24 MED ORDER — FENTANYL CITRATE (PF) 100 MCG/2ML IJ SOLN
INTRAMUSCULAR | Status: DC | PRN
  Administered 2020-01-24: 50 ug via INTRAVENOUS
  Administered 2020-01-24: 100 ug via INTRAVENOUS
  Administered 2020-01-24: 50 ug via INTRAVENOUS

## 2020-01-24 MED ORDER — HYDROMORPHONE HCL 1 MG/ML IJ SOLN: 0.5000 mg | INTRAMUSCULAR | Status: DC | PRN

## 2020-01-24 SURGICAL SUPPLY — 71 items
BLADE OSCILLATING/SAGITTAL (BLADE) ×1
BLADE SAGITTAL 25.0X1.19X90 (BLADE) IMPLANT
BLADE SAW 1/2 (BLADE) IMPLANT
BLADE SW THK.38XMED LNG THN (BLADE) ×1 IMPLANT
CNTNR SPEC 2.5X3XGRAD LEK (MISCELLANEOUS) ×1
CONNECTOR Y WND VAC (MISCELLANEOUS) ×1 IMPLANT
CONT SPEC 4OZ STER OR WHT (MISCELLANEOUS) ×1
CONTAINER SPEC 2.5X3XGRAD LEK (MISCELLANEOUS) ×1 IMPLANT
COOLER POLAR GLACIER W/PUMP (MISCELLANEOUS) ×2 IMPLANT
COVER BACK TABLE REUSABLE LG (DRAPES) ×2 IMPLANT
COVER WAND RF STERILE (DRAPES) ×2 IMPLANT
CUFF TOURN SGL QUICK 24 (TOURNIQUET CUFF)
CUFF TOURN SGL QUICK 30 (TOURNIQUET CUFF) ×1
CUFF TRNQT CYL 24X4X16.5-23 (TOURNIQUET CUFF) IMPLANT
CUFF TRNQT CYL 30X4X21-28X (TOURNIQUET CUFF) ×1 IMPLANT
DRAPE 3/4 80X56 (DRAPES) ×4 IMPLANT
DRSG DERMACEA 8X12 NADH (GAUZE/BANDAGES/DRESSINGS) IMPLANT
DRSG MEPILEX SACRM 8.7X9.8 (GAUZE/BANDAGES/DRESSINGS) ×2 IMPLANT
DRSG OPSITE POSTOP 4X14 (GAUZE/BANDAGES/DRESSINGS) ×2 IMPLANT
DURAPREP 26ML APPLICATOR (WOUND CARE) ×2 IMPLANT
ELECT REM PT RETURN 9FT ADLT (ELECTROSURGICAL) ×2
ELECTRODE REM PT RTRN 9FT ADLT (ELECTROSURGICAL) ×1 IMPLANT
GAUZE SPONGE 4X4 12PLY STRL (GAUZE/BANDAGES/DRESSINGS) ×2 IMPLANT
GLOVE BIOGEL M STRL SZ7.5 (GLOVE) ×2 IMPLANT
GLOVE INDICATOR 8.0 STRL GRN (GLOVE) ×2 IMPLANT
GOWN STRL REUS W/ TWL LRG LVL3 (GOWN DISPOSABLE) ×2 IMPLANT
GOWN STRL REUS W/TWL LRG LVL3 (GOWN DISPOSABLE) ×2
HANDPIECE VERSAJET DEBRIDEMENT (MISCELLANEOUS) ×4 IMPLANT
HEMOVAC 400CC 10FR (MISCELLANEOUS) IMPLANT
HOOD PEEL AWAY FLYTE STAYCOOL (MISCELLANEOUS) ×4 IMPLANT
IRRIGATION STRYKERFLOW (MISCELLANEOUS) ×1 IMPLANT
IRRIGATOR STRYKERFLOW (MISCELLANEOUS) ×2
KIT STIMULAN RAPID CURE 5CC (Orthopedic Implant) ×4 IMPLANT
KIT TURNOVER KIT A (KITS) ×2 IMPLANT
KNIFE SCULPS 14X20 (INSTRUMENTS) ×2 IMPLANT
MANIFOLD NEPTUNE II (INSTRUMENTS) ×4 IMPLANT
NDL SAFETY ECLIPSE 18X1.5 (NEEDLE) ×1 IMPLANT
NEEDLE HYPO 18GX1.5 SHARP (NEEDLE) ×1
NEEDLE SPNL 18GX3.5 QUINCKE PK (NEEDLE) ×2 IMPLANT
NEEDLE SPNL 20GX3.5 QUINCKE YW (NEEDLE) ×4 IMPLANT
NS IRRIG 1000ML POUR BTL (IV SOLUTION) ×2 IMPLANT
PACK TOTAL KNEE (MISCELLANEOUS) ×2 IMPLANT
PAD ABD DERMACEA PRESS 5X9 (GAUZE/BANDAGES/DRESSINGS) IMPLANT
PAD WRAPON POLAR KNEE (MISCELLANEOUS) ×1 IMPLANT
PENCIL SMOKE ULTRAEVAC 22 CON (MISCELLANEOUS) ×2 IMPLANT
PLATE ROT INSERT 10MM SIZE 4 (Plate) ×2 IMPLANT
PREVENA INCISION MGT 90 150 (MISCELLANEOUS) ×2 IMPLANT
PULSAVAC PLUS IRRIG FAN TIP (DISPOSABLE) ×2
SOL .9 NS 3000ML IRR  AL (IV SOLUTION) ×2
SOL .9 NS 3000ML IRR UROMATIC (IV SOLUTION) ×2 IMPLANT
SPONGE LAP 18X18 RF (DISPOSABLE) ×2 IMPLANT
STAPLER SKIN PROX 35W (STAPLE) ×2 IMPLANT
SUCTION FRAZIER HANDLE 10FR (MISCELLANEOUS) ×1
SUCTION TUBE FRAZIER 10FR DISP (MISCELLANEOUS) ×1 IMPLANT
SUT MNCRL 0 1X36 CT-1 (SUTURE) ×1 IMPLANT
SUT MON AB 2-0 CT1 36 (SUTURE) ×2 IMPLANT
SUT MONOCRYL 0 (SUTURE) ×1
SUT PROLENE 1 CT 1 30 (SUTURE) ×2 IMPLANT
SUT VIC AB 0 CT1 36 (SUTURE) ×2 IMPLANT
SUT VIC AB 1 CT1 36 (SUTURE) ×4 IMPLANT
SUT VIC AB 2-0 CT1 (SUTURE) ×2 IMPLANT
SWAB CULTURE AMIES ANAERIB BLU (MISCELLANEOUS) ×6 IMPLANT
SYR 20ML LL LF (SYRINGE) ×2 IMPLANT
SYR 30ML LL (SYRINGE) ×4 IMPLANT
TIP BRUSH PULSAVAC PLUS 24.33 (MISCELLANEOUS) ×2 IMPLANT
TIP FAN IRRIG PULSAVAC PLUS (DISPOSABLE) ×1 IMPLANT
TOWEL OR 17X26 4PK STRL BLUE (TOWEL DISPOSABLE) ×2 IMPLANT
TOWER CARTRIDGE SMART MIX (DISPOSABLE) ×2 IMPLANT
TRAY FOLEY MTR SLVR 16FR STAT (SET/KITS/TRAYS/PACK) ×2 IMPLANT
WND VAC CONN Y (MISCELLANEOUS) ×1
WRAPON POLAR PAD KNEE (MISCELLANEOUS) ×2

## 2020-01-24 NOTE — Progress Notes (Signed)
Pt alert and oriented x4, respirations even and non-labored. Pt off unit to surgery at this time in stable condition - NAD noted.

## 2020-01-24 NOTE — Consult Note (Signed)
NAME: Jasmine Morrow  DOB: September 24, 1945  MRN: [id number]  Date/Time: 01/23/2020 5:50 PM  REQUESTING PROVIDER:Kumar Subjective:  REASON FOR CONSULT: Right knee prosthetic joint infection Patient is known to me from previous admission. History from patient and chart reviewed in detail.  ? Jasmine Morrow is a 74 y.o. female with a history of hypertension, obstructive sleep apnea, GERD, asthma, anxiety, right total knee replacement, complicated right foot infection presents to the emergency room with draining right knee wound present for the past 5 days.  She has chronic right ankle wound which has been discharging as well.  She has no fever or chills.  She thinks she may have hit her right knee on the bed railing at home.  Her history is as follows  She was admitted to hospital between 1/7-1/14/21 for a fall and ankle fracture Rt  And had ORIF   on 02/08/19 Readmitted 1/2-/21-03/07/19  for MSSA bacteremia/GI bleed Surgical site had skin blisters and  TEE negative, CT of the lumbar spine no discitis Discharged to rehab on 03/07/19 with IV cefazolin to be given till 03/29/19 On 2/11 underwent ORIF of the medial malleolus. Culture taken from the joint that day was negative She finished antibiotic on 03/29/19. Followed up with Dr.Menz as OP and on 07/05/19 the xray taken in his office showed well healed trimalleolar ankle fracture dislocation, syndesmosis intact, and hardware that is intact.  There is joint space narrowing consistent with some both. Post-traumatic degenerative change. During October visit she was noted to have some discharge and blisters/open wound in the medial side On 11/12/19 she had removal of the rt ankle hardware by Dr.Menz. culture was rare staph aureus. She was prescribed keflex for 7 days by Dr.MEnz on 11/15/19. She continued to have non healing wound and drainage inspite of 2 rounds of antibiotics She was then followed at the wound clinic and culture taken shows MSSA .   She was  sent to the ED on 01/22/20 from the wound clinic with non healing wound In the ED BP 108/59, HR 121 , temp  Lactate 2.7, WBC 15.5 Rt knee xray showed rt knee replacement with a non displaced fracture of indeterminate age adjacent to the medical aspect of the distal right femoral prosthesis Large joint effusion I am asked to see the patient for the infection. Orthopedics has been consulted and Dr. Marry Guan is seen the patient. Past Medical History:  Diagnosis Date  . Anxiety   . Asthma   . GERD (gastroesophageal reflux disease)   . Hypertension   . Seasonal allergies   . Sleep apnea     Past Surgical History:  Procedure Laterality Date  . CESAREAN SECTION    . DILATION AND CURETTAGE OF UTERUS    . ESOPHAGOGASTRODUODENOSCOPY N/A 02/22/2019   Procedure: ESOPHAGOGASTRODUODENOSCOPY (EGD);  Surgeon: Toledo, Benay Pike, MD;  Location: [facility];  Service: Gastroenterology;  Laterality: N/A;  . FLEXIBLE SIGMOIDOSCOPY N/A 02/22/2019   Procedure: FLEXIBLE SIGMOIDOSCOPY;  Surgeon: Toledo, Benay Pike, MD;  Location: [facility];  Service: Gastroenterology;  Laterality: N/A;  . HARDWARE REMOVAL Right 11/12/2019   Procedure: Right ankle hardware removal;  Surgeon: Hessie Knows, MD;  Location: [facility];  Service: Orthopedics;  Laterality: Right;  . ORIF ANKLE FRACTURE Right 03/14/2019   Procedure: OPEN REDUCTION INTERNAL FIXATION (ORIF) ANKLE FRACTURE, MEDIAL MALLEOLUS;  Surgeon: Hessie Knows, MD;  Location: [facility];  Service: Orthopedics;  Laterality: Right;  . ORIF ANKLE FRACTURE Right 02/08/2019   Procedure: OPEN REDUCTION INTERNAL FIXATION (  ORIF) ANKLE FRACTURE, post malleolus;  Surgeon: Hessie Knows, MD;  Location: [facility];  Service: Orthopedics;  Laterality: Right;  . REPLACEMENT TOTAL KNEE Bilateral 2008   2009  . SYNDESMOSIS REPAIR Right 02/08/2019   Procedure: SYNDESMOSIS REPAIR;  Surgeon: Hessie Knows, MD;  Location: [facility];  Service: Orthopedics;  Laterality: Right;  . TEE WITHOUT  CARDIOVERSION N/A 02/26/2019   Procedure: TRANSESOPHAGEAL ECHOCARDIOGRAM (TEE);  Surgeon: Corey Skains, MD;  Location: [facility];  Service: Cardiovascular;  Laterality: N/A;  . 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 Marital status: Married    Spouse name: Not on file  . Number of children: Not on file  . Years of education: Not on file  . Highest education level: Not on file  Occupational History  . Not on file  Tobacco Use  . Smoking status: Former Smoker    Packs/day: 2.00    Years: 30.00    Pack years: 60.00    Types: Cigarettes    Quit date: 10/02/1990    Years since quitting: 29.3  . Smokeless tobacco: Never Used  Vaping Use  . Vaping Use: Never used  Substance and Sexual Activity  . Alcohol use: No    Alcohol/week: 0.0 standard drinks  . Drug use: No  . Sexual activity: Not on file  Other Topics Concern  . Not on file  Social History Narrative  . Not on file   Social Determinants of Health   Financial Resource Strain: Not on file  Food Insecurity: Not on file  Transportation Needs: Not on file  Physical Activity: Not on file  Stress: Not on file  Social Connections: Not on file  Intimate Partner Violence: Not on file    Family History  Problem Relation Age of Onset  . Bone cancer Mother   . COPD Father    Allergies  Allergen Reactions  . Celecoxib Nausea Only  . Hydrocodone-Acetaminophen Nausea And Vomiting  . Meloxicam Nausea Only  . Morphine Sulfate Er Beads Itching  . Naproxen Swelling  . Rofecoxib Nausea Only  . Sulfa Antibiotics Other (See Comments)    Reaction: unknown  . Ciprofloxacin Rash  . Nickel Rash  . Tramadol-Acetaminophen Nausea Only and Rash    Per pt she takes this when PT comes only makes her feel "Loopy" 11/08/2019    ? Current Facility-Administered Medications  Medication Dose Route Frequency Provider Last Rate Last Admin  . 0.9 %  sodium chloride infusion    Intravenous Continuous Mansy, Arvella Merles, MD   Stopped at 01/23/20 1556  . acetaminophen (TYLENOL) tablet 650 mg  650 mg Oral Q6H PRN Mansy, Jan A, MD       Or  . acetaminophen (TYLENOL) suppository 650 mg  650 mg Rectal Q6H PRN Mansy, Jan A, MD      . albuterol (VENTOLIN HFA) 108 (90 Base) MCG/ACT inhaler 2 puff  2 puff Inhalation Q4H PRN Mansy, Arvella Merles, MD      . calcium-vitamin D (OSCAL WITH D) 500-200 MG-UNIT per tablet 2 tablet  2 tablet Oral Daily Mansy, Jan A, MD      . carvedilol (COREG) tablet 3.125 mg  3.125 mg Oral BID Mansy, Jan A, MD      . cefTRIAXone (ROCEPHIN) 2 g in sodium chloride 0.9 % 100 mL IVPB  2 g Intravenous Q24H Shawna Clamp, MD      . escitalopram (LEXAPRO) tablet  10 mg  10 mg Oral Daily Mansy, Jan A, MD   10 mg at 01/23/20 0940  . HYDROmorphone (DILAUDID) injection 1 mg  1 mg Intravenous Q4H PRN Mansy, Jan A, MD      . hydroxypropyl methylcellulose / hypromellose (ISOPTO TEARS / GONIOVISC) 2.5 % ophthalmic solution 1 drop  1 drop Both Eyes TID PRN Mansy, Jan A, MD      . Derrill Memo ON 01/24/2020] influenza vaccine adjuvanted (FLUAD) injection 0.5 mL  0.5 mL Intramuscular Tomorrow-1000 Mansy, Jan A, MD      . lactated ringers bolus 1,000 mL  1,000 mL Intravenous Once Nance Pear, MD      . loratadine (CLARITIN) tablet 10 mg  10 mg Oral Daily Mansy, Jan A, MD   10 mg at 01/23/20 0940  . magnesium hydroxide (MILK OF MAGNESIA) suspension 30 mL  30 mL Oral Daily PRN Mansy, Jan A, MD      . multivitamin with minerals tablet 1 tablet  1 tablet Oral Daily Mansy, Jan A, MD      . multivitamin-lutein (OCUVITE-LUTEIN) capsule 1 capsule  1 capsule Oral BID Mansy, Jan A, MD      . ondansetron [facility]) tablet 4 mg  4 mg Oral Q6H PRN Mansy, Jan A, MD       Or  . ondansetron [facility]) injection 4 mg  4 mg Intravenous Q6H PRN Mansy, Jan A, MD   4 mg at 01/23/20 [id number]  . pantoprazole (PROTONIX) EC tablet 40 mg  40 mg Oral Daily Mansy, Jan A, MD   40 mg at 01/23/20 0940  . traMADol (ULTRAM)  tablet 50 mg  50 mg Oral Q6H PRN Mansy, Jan A, MD   50 mg at 01/23/20 0940  . traZODone (DESYREL) tablet 25 mg  25 mg Oral QHS PRN Mansy, Jan A, MD      . vancomycin (VANCOREADY) IVPB 750 mg/150 mL  750 mg Intravenous Q12H Dallie Piles, RPH         Abtx:  Anti-infectives (From admission, onward)   Start     Dose/Rate Route Frequency Ordered Stop   01/23/20 2200  cefTRIAXone (ROCEPHIN) 2 g in sodium chloride 0.9 % 100 mL IVPB        2 g 200 mL/hr over 30 Minutes Intravenous Every 24 hours 01/23/20 0935     01/23/20 2200  vancomycin (VANCOREADY) IVPB 750 mg/150 mL        750 mg 150 mL/hr over 60 Minutes Intravenous Every 12 hours 01/23/20 0959     01/23/20 0800  vancomycin (VANCOCIN) IVPB 1000 mg/200 mL premix  Status:  Discontinued        1,000 mg 200 mL/hr over 60 Minutes Intravenous Every 12 hours 01/23/20 0331 01/23/20 0959   01/22/20 2230  vancomycin (VANCOCIN) IVPB 1000 mg/200 mL premix  Status:  Discontinued        1,000 mg 200 mL/hr over 60 Minutes Intravenous  Once 01/22/20 2220 01/23/20 0033   01/22/20 2230  ceFAZolin (ANCEF) 2 g in dextrose 5 % 100 mL IVPB  Status:  Discontinued        2 g 240 mL/hr over 30 Minutes Intravenous Every 8 hours 01/22/20 2220 01/23/20 0935   01/22/20 2145  vancomycin (VANCOCIN) IVPB 1000 mg/200 mL premix        1,000 mg 200 mL/hr over 60 Minutes Intravenous  Once 01/22/20 2139 01/22/20 2348   01/22/20 2145  cefTRIAXone (ROCEPHIN) 2 g in sodium chloride 0.9 % 100  mL IVPB        2 g 200 mL/hr over 30 Minutes Intravenous  Once 01/22/20 2139 01/22/20 2229      REVIEW OF SYSTEMS:  Const: negative fever, negative chills, intentional weight loss of 100 pounds Eyes: negative diplopia or visual changes, negative eye pain ENT: negative coryza, negative sore throat Resp: negative cough, hemoptysis, dyspnea Cards: negative for chest pain, palpitations, lower extremity edema GU: negative for frequency, dysuria and hematuria GI: Negative for abdominal  pain, diarrhea, bleeding, constipation Skin: negative for rash and pruritus Heme: negative for easy bruising and gum/nose bleeding MS: Has weakness, pain in the right ankle and right knee, poor mobility for the past year. Neurolo:negative for headaches, dizziness, vertigo, memory problems  Psych:  anxiety, Endocrine: negative for thyroid, diabetes Allergy/Immunology-multiple allergies as above Objective:  VITALS:  BP 117/61 (BP Location: Left Arm)   Pulse 68   Temp 98.1 F (36.7 C) (Oral)   Resp 18   Ht 5\' 9"  (1.753 m)   Wt 88.5 kg   SpO2 100%   BMI 28.81 kg/m  PHYSICAL EXAM:  General: Alert, cooperative, no distress, appears stated age.  Head: Normocephalic, without obvious abnormality, atraumatic. Eyes: Conjunctivae clear, anicteric sclerae. Pupils are equal ENT Nares normal. No drainage or sinus tenderness. Lips, mucosa, and tongue mucosa dry . No Thrush Neck: Supple, symmetrical, no adenopathy, thyroid: non tender no carotid bruit and no JVD. Lungs: Clear to auscultation bilaterally. No Wheezing or Rhonchi. No rales. Heart: Regular rate and rhythm, no murmur, rub or gallop. Abdomen: Soft, non-tender,not distended. Bowel sounds normal. No masses Extremities: Right knee swollen Surgical scar Has a small opening with serosanguineous discharge Right ankle medial aspect there is a surgical scar with a chronic fistulous wound     Edema legs. Skin: No rashes or lesions. Or bruising Lymph: Cervical, supraclavicular normal. Neurologic: Grossly non-focal Pertinent Labs Lab Results CBC    Component Value Date/Time   WBC 9.9 01/23/2020 0540   RBC 3.60 (L) 01/23/2020 0540   HGB 7.3 (L) 01/23/2020 0824   HCT 24.8 (L) 01/23/2020 0824   PLT 668 (H) 01/23/2020 0540   MCV 69.7 (L) 01/23/2020 0540   MCH 21.4 (L) 01/23/2020 0540   MCHC 30.7 01/23/2020 0540   RDW 16.0 (H) 01/23/2020 0540   LYMPHSABS 1.6 01/22/2020 1915   MONOABS 0.9 01/22/2020 1915   EOSABS 0.1 01/22/2020  1915   BASOSABS 0.1 01/22/2020 1915    CMP Latest Ref Rng & Units 01/23/2020 01/22/2020 03/01/2019  Glucose 70 - 99 mg/dL 102(H) 132(H) 95  BUN 8 - 23 mg/dL 12 13 12   Creatinine 0.44 - 1.00 mg/dL 0.83 0.86 0.58  Sodium 135 - 145 mmol/L 140 137 137  Potassium 3.5 - 5.1 mmol/L 3.8 4.1 3.8  Chloride 98 - 111 mmol/L 108 102 109  CO2 22 - 32 mmol/L 26 22 23   Calcium 8.9 - 10.3 mg/dL 9.8 10.5(H) 8.5(L)  Total Protein 6.5 - 8.1 g/dL - 7.8 -  Total Bilirubin 0.3 - 1.2 mg/dL - 0.6 -  Alkaline Phos 38 - 126 U/L - 124 -  AST 15 - 41 U/L - 20 -  ALT 0 - 44 U/L - 11 -      Microbiology: Recent Results (from the past 240 hour(s))  Aerobic Culture (superficial specimen)     Status: None (Preliminary result)   Collection Time: 01/22/20  1:50 PM   Specimen: KNEE  Result Value Ref Range Status   Specimen Description  Final    KNEE Performed at [facility], [address], [place], [place] [address]    Special Requests   Final    NONE Performed at [facility], [address] [place], [place] [address]    Gram Stain   Final    RARE WBC PRESENT,BOTH PMN AND MONONUCLEAR NO ORGANISMS SEEN Performed at [facility], [address], [place], [place] [address]    Culture PENDING  Incomplete   Report Status PENDING  Incomplete  Culture, blood (Routine x 2)     Status: None (Preliminary result)   Collection Time: 01/22/20  7:15 PM   Specimen: BLOOD  Result Value Ref Range Status   Specimen Description BLOOD BLOOD RIGHT HAND  Final   Special Requests   Final    BOTTLES DRAWN AEROBIC ONLY Blood Culture results may not be optimal due to an inadequate volume of blood received in culture bottles   Culture   Final    NO GROWTH < 12 HOURS Performed at [facility], [address], [place], [place] [address]    Report Status PENDING  Incomplete  Culture, blood (Routine x 2)     Status: None (Preliminary result)   Collection Time: 01/22/20 10:02 PM    Specimen: BLOOD  Result Value Ref Range Status   Specimen Description BLOOD RIGHT ANTECUBITAL  Final   Special Requests   Final    BOTTLES DRAWN AEROBIC AND ANAEROBIC Blood Culture adequate volume   Culture   Final    NO GROWTH < 12 HOURS Performed at [facility], [address] [place], [place] [address]    Report Status PENDING  Incomplete  Resp Panel by RT-PCR (Flu A&B, Covid) Nasopharyngeal Swab     Status: None   Collection Time: 01/22/20 10:02 PM   Specimen: Nasopharyngeal Swab; Nasopharyngeal(NP) swabs in vial transport medium  Result Value Ref Range Status   SARS Coronavirus 2 by RT PCR NEGATIVE NEGATIVE Final    Comment: (NOTE) SARS-CoV-2 target nucleic acids are NOT DETECTED.  The SARS-CoV-2 RNA is generally detectable in upper respiratory specimens during the acute phase of infection. The lowest concentration of SARS-CoV-2 viral copies this assay can detect is 138 copies/mL. A negative result does not preclude SARS-Cov-2 infection and should not be used as the sole basis for treatment or other patient management decisions. A negative result may occur with  improper specimen collection/handling, submission of specimen other than nasopharyngeal swab, presence of viral mutation(s) within the areas targeted by this assay, and inadequate number of viral copies(<138 copies/mL). A negative result must be combined with clinical observations, patient history, and epidemiological information. The expected result is Negative.  Fact Sheet for Patients:  BloggerCourse.com  Fact Sheet for Healthcare Providers:  SeriousBroker.it  This test is no t yet approved or cleared by the [place] FDA and  has been authorized for detection and/or diagnosis of SARS-CoV-2 by FDA under an Emergency Use Authorization (EUA). This EUA will remain  in effect (meaning this test can be used) for the duration of the COVID-19  declaration under Section 564(b)(1) of the Act, 21 U.S.C.section 360bbb-3(b)(1), unless the authorization is terminated  or revoked sooner.       Influenza A by PCR NEGATIVE NEGATIVE Final   Influenza B by PCR NEGATIVE NEGATIVE Final    Comment: (NOTE) The Xpert Xpress SARS-CoV-2/FLU/RSV plus assay is intended as an aid in the diagnosis of influenza from Nasopharyngeal swab specimens and should not  be used as a sole basis for treatment. Nasal washings and aspirates are unacceptable for Xpert Xpress SARS-CoV-2/FLU/RSV testing.  Fact Sheet for Patients: EntrepreneurPulse.com.au  Fact Sheet for Healthcare Providers: IncredibleEmployment.be  This test is not yet approved or cleared by the [place] FDA and has been authorized for detection and/or diagnosis of SARS-CoV-2 by FDA under an Emergency Use Authorization (EUA). This EUA will remain in effect (meaning this test can be used) for the duration of the COVID-19 declaration under Section 564(b)(1) of the Act, 21 U.S.C. section 360bbb-3(b)(1), unless the authorization is terminated or revoked.  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    Right knee replacement with a nondisplaced fracture the of indeterminate age adjacent to the medial aspect of the distal right femoral prosthesis.2. Large joint effusion.  Impression/Recommendation ? Rt periprosthetic knee infection likely from the ongoing rt ankle infection.  Likely going to be staph aureus.  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  Anemia.  Chronic infection could be contributing. Patient has a history of GI bleed.  MSSA bacteremia in Jan 2021- receievd 6 weeks of IV antibiotics ? Patient is currently on vancomycin and ceftriaxone.  We will de-escalate once cultures available.  ? ___________________________________________________ Discussed with patient,  ID will follow her peripherally this weekend.  Call if needed.  [phone]. Note:  This document was prepared using Dragon voice recognition software and may include unintentional dictation errors.

## 2020-01-24 NOTE — Transfer of Care (Signed)
Immediate Anesthesia Transfer of Care Note  Patient: Jasmine Morrow  Procedure(s) Performed: TOTAL KNEE REVISION WITH SCAR DEBRIDEMENT/PATELLA REVISION WITH POLY EXCHANGE (Right Knee)  Patient Location: PACU  Anesthesia Type:General  Level of Consciousness: drowsy  Airway & Oxygen Therapy: Patient Spontanous Breathing and Patient connected to face mask oxygen  Post-op Assessment: Report given to RN and Post -op Vital signs reviewed and stable  Post vital signs: Reviewed and stable  Last Vitals:  Vitals Value Taken Time  BP 115/63 01/24/20 1434  Temp    Pulse 76 01/24/20 1437  Resp 18 01/24/20 1437  SpO2 100 % 01/24/20 1437  Vitals shown include unvalidated device data.  Last Pain:  Vitals:   01/24/20 0752  TempSrc: Oral  PainSc:          Complications: No complications documented.

## 2020-01-24 NOTE — Anesthesia Procedure Notes (Signed)
Procedure Name: Intubation Date/Time: 01/24/2020 10:46 AM Performed by: Tollie Eth, CRNA Pre-anesthesia Checklist: Patient identified, Patient being monitored, Timeout performed, Emergency Drugs available and Suction available Patient Re-evaluated:Patient Re-evaluated prior to induction Oxygen Delivery Method: Circle system utilized Preoxygenation: Pre-oxygenation with 100% oxygen Induction Type: IV induction Ventilation: Mask ventilation without difficulty Laryngoscope Size: 3 and McGraph Grade View: Grade I Tube type: Oral Tube size: 6.5 mm Number of attempts: 1 Airway Equipment and Method: Stylet Placement Confirmation: ETT inserted through vocal cords under direct vision,  positive ETCO2 and breath sounds checked- equal and bilateral Secured at: 21 cm Tube secured with: Tape Dental Injury: Teeth and Oropharynx as per pre-operative assessment

## 2020-01-24 NOTE — Op Note (Signed)
OPERATIVE NOTE  DATE OF SURGERY:  01/22/2020 - 01/24/2020  PATIENT NAME:  Jasmine Morrow   DOB: Aug 18, 1945  MRN: [id number]   PRE-OPERATIVE DIAGNOSIS: Right periprosthetic knee infection  POST-OPERATIVE DIAGNOSIS:  Same  PROCEDURE: Right knee arthrotomy, irrigation and debridement of the right knee, polyethylene exchange  SURGEON:  Marciano Sequin., M.D.   ASSISTANT: Cassell Smiles, PA-C  ANESTHESIA: general  ESTIMATED BLOOD LOSS: 50 mL  FLUIDS REPLACED: 1300 mL of crystalloid  TOURNIQUET TIME: 83 minutes  DRAINS: 2 medium Hemovac drains, wound VAC  IMPLANTS UTILIZED: DePuy size 410 mm rotating platform stabilized polyethylene insert; Stimulan beads  INDICATIONS FOR SURGERY: Jasmine Morrow is a 74 y.o. year old female who has remote history of right total knee arthroplasty.  She was being seen by [facility] for a nonhealing right ankle lesion, and the [facility] physician apparently probed an area of the knee and had the expression of purulent material.  Findings were concerning for right periprosthetic infection.  After discussion of the risks and benefits of surgical intervention, the patient expressed understanding of the risks benefits and agree with plans for right knee arthrotomy, irrigation debridement, polyethylene exchange, and placement of antibiotic Stimulan beads.Marland Kitchen   PROCEDURE IN DETAIL: The patient was brought into the operating room and, after adequate general endotracheal anesthesia was achieved, a tourniquet was placed on the patient's upper right thigh.  Patient's right knee and leg were cleaned and prepped with alcohol and DuraPrep draped usual sterile fashion.  A "timeout" was performed as per usual protocol.  The right lower extreme was elevated in order to exsanguinate the leg and the tourniquet was inflated to 300 mmHg.  An anterior longitudinal incision was made in line with the previous surgical scar.  Of note was a punctate lesion along the inferior aspect of  the wound with some drainage.  Dissection was continued into the subcutaneous tissue and there was a pocket of purulent material noted along the medial flare of the tibia.  Swabs were obtained for stat Gram stain and cultures.  The wound was debrided using the Versajet and irrigated.  Next, the right knee was aspirated and approximately 5 cc of purulent drainage was aspirated and submitted for complete cell count, stat Gram stain, and cultures.  A medial parapatellar approach was utilized and copious amounts of purulent material were encountered.  Dense fibrotic tissue was encountered along the medial lateral gutters and extending along the infrapatellar area.  The fibrotic tissue was sharply excised using electrocautery.  Swabs were obtained from the medial gutter, lateral gutter, and suprapatellar pouch for stat Gram stain and cultures.  The medial and lateral gutters were reestablished and the knee was flexed so as to remove the polyethylene insert.  There was no significant wear appreciated on the polyethylene insert.  Swabs were obtained of the posterior recess and submitted for stat Gram stain and cultures.  The soft tissue along the posterior recess was debrided sharply using electrocautery.  The Versajet was then used to systematically debrided first the medial gutter, extending into the suprapatellar pouch, down the lateral gutter, and then along the posterior recess.  A total of almost 3000 mL of saline was utilized for the debridement with the Versajet.  The tourniquet was deflated after total tourniquet time of 83 minutes.  Hemostasis was achieved using electrocautery.  Next, the knee was irrigated with a total of 6000 cc normal saline with antibiotic solution.  Finally, surgery for was used to soak the  wound for approximately 5 minutes.  A total of 10 cc of Stimulan powder was mixed with 1 g of vancomycin and 240 mg of gentamicin so as to form beads.  The beads were placed along the posterior recess,  medial lateral gutters, suprapatellar pouch, and along the subcutaneous recess.  A Depuy size 410 mm rotating platform polyethylene insert was inserted and the knee was placed range of motion with good tracking appreciated.  2 medium Hemovac drains were placed wound bed brought through separate stab incision.  The medial parapatellar incision was reapproximated using interrupted sutures of #1 Prolene.  Subcutaneous tissue was approximated in layers using first #0 Monocryl followed by #2-0 Monocryl.  The tissue along the inferior aspect of the wound was somewhat friable but was well approximated.  Skin was then closed with skin staples.  A wound VAC was applied.  At the conclusion of the case, the dressing from the medial ankle lesion was removed and cleaned.  A wound VAC pad was applied and connected to the wound VAC.  The patient tolerated the procedure well.  She was transported to the recovery room in stable condition.  Jasmine Morrow M.D.

## 2020-01-24 NOTE — Anesthesia Preprocedure Evaluation (Signed)
Anesthesia Evaluation  Patient identified by MRN, date of birth, ID band Patient awake  General Assessment Comment:Patient with infected prosthetic knee, as well as infected ankle  Reviewed: Allergy & Precautions, NPO status , Patient's Chart, lab work & pertinent test results  History of Anesthesia Complications Negative for: history of anesthetic complications  Airway Mallampati: III  TM Distance: >3 FB Neck ROM: Full    Dental  (+) Implants, Teeth Intact   Pulmonary sleep apnea and Continuous Positive Airway Pressure Ventilation , Patient abstained from smoking.Not current smoker, former smoker,    breath sounds clear to auscultation- rhonchi (-) wheezing      Cardiovascular hypertension, Pt. on medications (-) CAD, (-) Past MI, (-) Cardiac Stents and (-) CABG  Rhythm:Regular Rate:Normal - Systolic murmurs and - Diastolic murmurs    Neuro/Psych neg Seizures Anxiety negative neurological ROS     GI/Hepatic Neg liver ROS, GERD  ,  Endo/Other  negative endocrine ROSneg diabetes  Renal/GU negative Renal ROS     Musculoskeletal negative musculoskeletal ROS (+)   Abdominal (+) - obese,   Peds  Hematology  (+) Blood dyscrasia, anemia , S/p 1u PRBC transfusion, no clear source of bleeding, presumed chronic disease    Anesthesia Other Findings Past Medical History: No date: Anxiety No date: Asthma No date: GERD (gastroesophageal reflux disease) No date: Hypertension No date: Seasonal allergies No date: Sleep apnea   Reproductive/Obstetrics                             Anesthesia Physical  Anesthesia Plan  ASA: III  Anesthesia Plan: General   Post-op Pain Management:    Induction: Intravenous  PONV Risk Score and Plan: 3 and Dexamethasone, Ondansetron and Treatment may vary due to age or medical condition  Airway Management Planned: Oral ETT  Additional Equipment: None  Intra-op  Plan:   Post-operative Plan: Extubation in OR  Informed Consent: I have reviewed the patients History and Physical, chart, labs and discussed the procedure including the risks, benefits and alternatives for the proposed anesthesia with the patient or authorized representative who has indicated his/her understanding and acceptance.     Dental advisory given  Plan Discussed with: CRNA and Anesthesiologist  Anesthesia Plan Comments: (Discussed risks of anesthesia with patient, including PONV, sore throat, lip/dental damage. Rare risks discussed as well, such as cardiorespiratory and neurological sequelae. Patient understands.)        Anesthesia Quick Evaluation

## 2020-01-24 NOTE — Progress Notes (Signed)
PROGRESS NOTE    Jasmine Morrow  [id number] DOB: Mar 23, 1945 DOA: 01/22/2020 PCP: Rusty Aus, MD   Brief Narrative:  This 74 years old female with PMH of asthma, hypertension, obstructive sleep apnea, GERD, anxiety presents to the emergency department with acute onset of right ankle swelling and pain with draining wound which has developed over the last 5 days.  Patient was seen in the wound care clinic and was sent to the emergency department after she was found to have significant drainage from her wound.  Patient reports that she has complication from right ankle surgery and hardware infection and underwent several debridement surgeries in this year. Patient is admitted for sepsis secondary to wound infection.  Patient was started on ceftriaxone and vancomycin,  infectious disease and orthopedics consulted.  She is found to have anemia , has received 1 unit PRBC. Hb 8.8  Assessment & Plan:   Active Problems:   Abscess of right knee   Sepsis secondary to right knee abscess with right knee effusion: - Patient presented with tachycardia, tachypnea, hypotension, lactic acid 2.7, obvious source of infection. -The patient will be admitted to a medical monitored bed. -Continue empiric antibiotics with IV vancomycin and Rocephin. -Follow blood cultures and wound cultures. -Adequate pain control. -Orthopedics Dr. Marry Guan consulted.  Patient is scheduled to have debridement today. - Infectious disease was consulted, recommended to continue current antibiotics,  awaiting blood and wound cultures. -Patient might need surgical intervention depends upon Ortho evaluation.  2.  Essential hypertension      Hold Coreg.  Blood pressure is on soft side.  3.  Depression.    Continue Lexapro  4.  GERD.    Continue Protonix.  5.  Asthma.    Continue as needed albuterol  6.  Microcytic hypochromic anemia. Baseline hemoglobin runs between 8 and 9 Hemoglobin 7.3.  S/p Transfuse 1 PRBC .   Hemoglobin 8.8 Obtain iron profile.   DVT prophylaxis: Lovenox Code Status: Full code Family Communication: No family at bedside Disposition Plan:   Status is: Inpatient  Remains inpatient appropriate because:Inpatient level of care appropriate due to severity of illness   Dispo: The patient is from: Home              Anticipated d/c is to: SNF              Anticipated d/c date is: > 3 days              Patient currently is not medically stable to d/c.  Consultants:   Orthopedics  Infectious disease  Procedures: None Antimicrobials:  Anti-infectives (From admission, onward)   Start     Dose/Rate Route Frequency Ordered Stop   01/24/20 1235  vancomycin (VANCOCIN) powder  Status:  Discontinued          As needed 01/24/20 1235 01/24/20 1434   01/24/20 1234  gentamicin (GARAMYCIN) injection  Status:  Discontinued          As needed 01/24/20 1234 01/24/20 1434   01/23/20 2200  [MAR Hold]  cefTRIAXone (ROCEPHIN) 2 g in sodium chloride 0.9 % 100 mL IVPB        (MAR Hold since Fri 01/24/2020 at 1036.Hold Reason: Transfer to a Procedural area.)   2 g 200 mL/hr over 30 Minutes Intravenous Every 24 hours 01/23/20 0935     01/23/20 2200  [MAR Hold]  vancomycin (VANCOREADY) IVPB 750 mg/150 mL        (MAR Hold since Fri 01/24/2020  at 1036.Hold Reason: Transfer to a Procedural area.)   750 mg 150 mL/hr over 60 Minutes Intravenous Every 12 hours 01/23/20 0959     01/23/20 0800  vancomycin (VANCOCIN) IVPB 1000 mg/200 mL premix  Status:  Discontinued        1,000 mg 200 mL/hr over 60 Minutes Intravenous Every 12 hours 01/23/20 0331 01/23/20 0959   01/22/20 2230  vancomycin (VANCOCIN) IVPB 1000 mg/200 mL premix  Status:  Discontinued        1,000 mg 200 mL/hr over 60 Minutes Intravenous  Once 01/22/20 2220 01/23/20 0033   01/22/20 2230  ceFAZolin (ANCEF) 2 g in dextrose 5 % 100 mL IVPB  Status:  Discontinued        2 g 240 mL/hr over 30 Minutes Intravenous Every 8 hours 01/22/20 2220  01/23/20 0935   01/22/20 2145  vancomycin (VANCOCIN) IVPB 1000 mg/200 mL premix        1,000 mg 200 mL/hr over 60 Minutes Intravenous  Once 01/22/20 2139 01/22/20 2348   01/22/20 2145  cefTRIAXone (ROCEPHIN) 2 g in sodium chloride 0.9 % 100 mL IVPB        2 g 200 mL/hr over 30 Minutes Intravenous  Once 01/22/20 2139 01/22/20 2229      Subjective: Patient was seen and examined at bedside.  Overnight events noted.  Patient reports having pain in the right ankle and right knee.   She still has significant edema in the right leg,  states she has been bedbound for last 1 year.  Patient is going to have debridement today.  Objective: Vitals:   01/23/20 2300 01/24/20 0412 01/24/20 0750 01/24/20 0752  BP: (!) 102/51 (!) 131/56  (!) 122/50  Pulse: 68 70 88 70  Resp: 17 20  16   Temp: 97.8 F (36.6 C) 98.3 F (36.8 C)  98 F (36.7 C)  TempSrc: Oral Oral  Oral  SpO2: 100% 97% 99% 97%  Weight:      Height:        Intake/Output Summary (Last 24 hours) at 01/24/2020 1441 Last data filed at 01/24/2020 1359 Gross per 24 hour  Intake 3688.58 ml  Output 900 ml  Net 2788.58 ml   Filed Weights   01/23/20 0210  Weight: 88.5 kg    Examination:  General exam: Appears calm and comfortable, not in any acute distress. Respiratory system: Clear to auscultation. Respiratory effort normal. Cardiovascular system: S1 & S2 heard, RRR. No JVD, murmurs, rubs, gallops or clicks. No pedal edema. Gastrointestinal system: Abdomen is nondistended, soft and nontender. No organomegaly or masses felt. Normal bowel sounds heard. Central nervous system: Alert and oriented. No focal neurological deficits. Extremities: Right leg swollen,  surgical scar noted, open wound with drainage noted. Skin: No rashes, lesions or ulcers Psychiatry: Judgement and insight appear normal. Mood & affect appropriate.     Data Reviewed: I have personally reviewed following labs and imaging studies  CBC: Recent Labs  Lab  01/22/20 1915 01/23/20 0540 01/23/20 0824 01/23/20 1958 01/24/20 0538  WBC 15.5* 9.9  --   --   --   NEUTROABS 12.8*  --   --   --   --   HGB 10.0* 7.7* 7.3* 8.3* 8.8*  HCT 33.2* 25.1* 24.8* 26.7* 28.5*  MCV 70.2* 69.7*  --   --   --   PLT 797* 668*  --   --   --    Basic Metabolic Panel: Recent Labs  Lab 01/22/20 1915 01/23/20 0540  01/24/20 0538  NA 137 140 142  K 4.1 3.8 3.9  CL 102 108 110  CO2 22 26 25   GLUCOSE 132* 102* 94  BUN 13 12 9   CREATININE 0.86 0.83 0.71  CALCIUM 10.5* 9.8 9.6  MG  --   --  1.9  PHOS  --   --  3.8   GFR: Estimated Creatinine Clearance: 74.3 mL/min (by C-G formula based on SCr of 0.71 mg/dL). Liver Function Tests: Recent Labs  Lab 01/22/20 1915  AST 20  ALT 11  ALKPHOS 124  BILITOT 0.6  PROT 7.8  ALBUMIN 3.1*   No results for input(s): LIPASE, AMYLASE in the last 168 hours. No results for input(s): AMMONIA in the last 168 hours. Coagulation Profile: Recent Labs  Lab 01/22/20 1915  INR 1.2   Cardiac Enzymes: No results for input(s): CKTOTAL, CKMB, CKMBINDEX, TROPONINI in the last 168 hours. BNP (last 3 results) No results for input(s): PROBNP in the last 8760 hours. HbA1C: No results for input(s): HGBA1C in the last 72 hours. CBG: No results for input(s): GLUCAP in the last 168 hours. Lipid Profile: No results for input(s): CHOL, HDL, LDLCALC, TRIG, CHOLHDL, LDLDIRECT in the last 72 hours. Thyroid Function Tests: No results for input(s): TSH, T4TOTAL, FREET4, T3FREE, THYROIDAB in the last 72 hours. Anemia Panel: Recent Labs    01/23/20 0540  FERRITIN 66  TIBC 224*  IRON 18*   Sepsis Labs: Recent Labs  Lab 01/22/20 1915 01/22/20 2202  LATICACIDVEN 2.7* 2.3*    Recent Results (from the past 240 hour(s))  Aerobic Culture (superficial specimen)     Status: None (Preliminary result)   Collection Time: 01/22/20  1:50 PM   Specimen: KNEE  Result Value Ref Range Status   Specimen Description   Final     KNEE Performed at [facility], [address] [place], [place] [address]    Special Requests   Final    NONE Performed at [facility], [address] [place], [place] [address]    Gram Stain   Final    RARE WBC PRESENT,BOTH PMN AND MONONUCLEAR NO ORGANISMS SEEN    Culture   Final    NO GROWTH < 24 HOURS Performed at [facility], [place] [address], [place], [place] [address]    Report Status PENDING  Incomplete  Culture, blood (Routine x 2)     Status: None (Preliminary result)   Collection Time: 01/22/20  7:15 PM   Specimen: BLOOD  Result Value Ref Range Status   Specimen Description BLOOD BLOOD RIGHT HAND  Final   Special Requests   Final    BOTTLES DRAWN AEROBIC ONLY Blood Culture results may not be optimal due to an inadequate volume of blood received in culture bottles   Culture   Final    NO GROWTH 2 DAYS Performed at [facility], [address], [place],  [address]    Report Status PENDING  Incomplete  Culture, blood (Routine x 2)     Status: None (Preliminary result)   Collection Time: 01/22/20 10:02 PM   Specimen: BLOOD  Result Value Ref Range Status   Specimen Description BLOOD RIGHT ANTECUBITAL  Final   Special Requests   Final    BOTTLES DRAWN AEROBIC AND ANAEROBIC Blood Culture adequate volume   Culture   Final    NO GROWTH 2 DAYS Performed at [facility], [address], [place],  [address]    Report Status  PENDING  Incomplete  Resp Panel by RT-PCR (Flu A&B, Covid) Nasopharyngeal Swab     Status: None   Collection Time: 01/22/20 10:02 PM   Specimen: Nasopharyngeal Swab; Nasopharyngeal(NP) swabs in vial transport medium  Result Value Ref Range Status   SARS Coronavirus 2 by RT PCR NEGATIVE NEGATIVE Final    Comment: (NOTE) SARS-CoV-2 target nucleic acids are NOT DETECTED.  The SARS-CoV-2 RNA is generally detectable in upper respiratory specimens during the acute phase of infection. The  lowest concentration of SARS-CoV-2 viral copies this assay can detect is 138 copies/mL. A negative result does not preclude SARS-Cov-2 infection and should not be used as the sole basis for treatment or other patient management decisions. A negative result may occur with  improper specimen collection/handling, submission of specimen other than nasopharyngeal swab, presence of viral mutation(s) within the areas targeted by this assay, and inadequate number of viral copies(<138 copies/mL). A negative result must be combined with clinical observations, patient history, and epidemiological information. The expected result is Negative.  Fact Sheet for Patients:  EntrepreneurPulse.com.au  Fact Sheet for Healthcare Providers:  IncredibleEmployment.be  This test is no t yet approved or cleared by the [place] FDA and  has been authorized for detection and/or diagnosis of SARS-CoV-2 by FDA under an Emergency Use Authorization (EUA). This EUA will remain  in effect (meaning this test can be used) for the duration of the COVID-19 declaration under Section 564(b)(1) of the Act, 21 U.S.C.section 360bbb-3(b)(1), unless the authorization is terminated  or revoked sooner.       Influenza A by PCR NEGATIVE NEGATIVE Final   Influenza B by PCR NEGATIVE NEGATIVE Final    Comment: (NOTE) The Xpert Xpress SARS-CoV-2/FLU/RSV plus assay is intended as an aid in the diagnosis of influenza from Nasopharyngeal swab specimens and should not be used as a sole basis for treatment. Nasal washings and aspirates are unacceptable for Xpert Xpress SARS-CoV-2/FLU/RSV testing.  Fact Sheet for Patients: EntrepreneurPulse.com.au  Fact Sheet for Healthcare Providers: IncredibleEmployment.be  This test is not yet approved or cleared by the [place] FDA and has been authorized for detection and/or diagnosis of SARS-CoV-2 by FDA under  an Emergency Use Authorization (EUA). This EUA will remain in effect (meaning this test can be used) for the duration of the COVID-19 declaration under Section 564(b)(1) of the Act, 21 U.S.C. section 360bbb-3(b)(1), unless the authorization is terminated or revoked.  Performed at [facility], [address] [place], [place] [address]   Aerobic Culture (superficial specimen)     Status: None (Preliminary result)   Collection Time: 01/23/20  7:02 PM   Specimen: Wound  Result Value Ref Range Status   Specimen Description   Final    WOUND Performed at [facility], [address], [place], [place] [address]    Special Requests   Final    NONE Performed at [facility], [address] [place], [place] [address]    Gram Stain   Final    FEW WBC PRESENT, PREDOMINANTLY PMN NO ORGANISMS SEEN Performed at [facility], [place] [address], [place], [place] [address]    Culture PENDING  Incomplete   Report Status PENDING  Incomplete  Gram stain     Status: None   Collection Time: 01/24/20 11:43 AM   Specimen: PATH Other; Wound  Result Value Ref Range Status   Specimen Description WOUND  Final   Special Requests NONE  Final   Gram Stain  Final    MODERATE WBC SEEN NO ORGANISMS SEEN RESULT CALLED TO, READ BACK BY AND VERIFIED WITH: Caralee Ates AT 1218 01/24/20 SDR Performed at [facility], [address] [place], [place] [address]    Report Status 01/24/2020 FINAL  Final  Gram stain     Status: None   Collection Time: 01/24/20 12:15 PM   Specimen: Wound  Result Value Ref Range Status   Specimen Description WOUND  Final   Special Requests C RIGHT KNEE  SUPRA PATELLAR POUCH   Final   Gram Stain   Final    MODERATE WBC SEEN NO ORGANISMS SEEN RESULT CALLED TO, READ BACK BY AND VERIFIED WITHCaralee Ates AT 3614 01/24/20 SDR Performed at [facility], [address] [place], [place] [address]    Report Status  01/24/2020 FINAL  Final  Gram stain     Status: None   Collection Time: 01/24/20 12:20 PM   Specimen: Wound  Result Value Ref Range Status   Specimen Description WOUND  Final   Special Requests D RIGHT  KNEE LATERAL GUTTEN  Final   Gram Stain   Final    FEW WBC SEEN NO ORGANISMS SEEN TERESA BENSON AT 0086 01/24/20 SDR Performed at [facility], [address] [place], [place] [address]    Report Status 01/24/2020 FINAL  Final  Gram stain     Status: None   Collection Time: 01/24/20 12:22 PM   Specimen: Wound  Result Value Ref Range Status   Specimen Description WOUND  Final   Special Requests E RIGHT KNEE MEDIAL GUTTEN  Final   Gram Stain   Final    FEW WBC SEEN NO ORGANISMS SEEN RESULT CALLED TO, READ BACK BY AND VERIFIED WITHCaralee Ates AT 0932 01/24/20 SDR Performed at [facility], [address] [place], [place] [address]    Report Status 01/24/2020 FINAL  Final  Gram stain     Status: None   Collection Time: 01/24/20 12:24 PM   Specimen: Wound  Result Value Ref Range Status   Specimen Description WOUND  Final   Special Requests F RIGHT KNEE POSTERIOR RECESS  Final   Gram Stain   Final    MANY WBC SEEN NO ORGANISMS SEEN RESULT CALLED TO, READ BACK BY AND VERIFIED WITHCaralee Ates AT 5809 01/24/20 SDR Performed at [facility], [address], [place], [place] [address]    Report Status 01/24/2020 FINAL  Final    Radiology Studies: DG Tibia/Fibula Right  Result Date: 01/22/2020 CLINICAL DATA:  Nonhealing right foot wound. EXAM: RIGHT TIBIA AND FIBULA - 2 VIEW COMPARISON:  March 14, 2019 FINDINGS: There is no evidence of an acute fracture or dislocation. A chronic fracture deformity is seen involving the shaft of the mid right fibula. Radiopaque fixation plates and screws are also seen involving the distal right tibia, right medial malleolus and right lateral malleolus. A chronic deformity is seen involving the right ankle  mortise and adjacent portion of the dome of the talus. Moderate severity diffuse soft tissue swelling is noted. IMPRESSION: 1. Chronic fracture deformity of the mid right fibula with chronic posttraumatic and postsurgical changes involving the right ankle, as described above. 2. Moderate severity diffuse soft tissue swelling. Electronically Signed   By: Virgina Norfolk M.D.   On: 01/22/2020 19:54   DG Knee Complete 4 Views Right  Result Date: 01/22/2020 CLINICAL DATA:  Anterior right knee wound. EXAM: RIGHT KNEE - COMPLETE 4+  VIEW COMPARISON:  October 11, 2006 FINDINGS: A right knee replacement is seen. A curvilinear cortical lucency is seen within the medial aspect of the distal right femur, adjacent to the distal right femoral prosthesis. A chronic fracture deformity seen involving the mid right fibular shaft. There is no evidence of dislocation. A large joint effusion is seen. Subsequent marked severity anterior and medial soft tissue swelling is seen. No soft tissue air is identified. IMPRESSION: 1. Right knee replacement with a nondisplaced fracture the of indeterminate age adjacent to the medial aspect of the distal right femoral prosthesis. 2. Large joint effusion. Electronically Signed   By: Virgina Norfolk M.D.   On: 01/22/2020 19:50   Scheduled Meds: . [MAR Hold] calcium-vitamin D  2 tablet Oral Daily  . [MAR Hold] escitalopram  10 mg Oral Daily  . influenza vaccine adjuvanted  0.5 mL Intramuscular Tomorrow-1000  . [MAR Hold] loratadine  10 mg Oral Daily  . [MAR Hold] multivitamin with minerals  1 tablet Oral Daily  . [MAR Hold] multivitamin-lutein  1 capsule Oral BID  . [MAR Hold] pantoprazole  40 mg Oral Daily   Continuous Infusions: . sodium chloride Stopped (01/23/20 1556)  . [MAR Hold] cefTRIAXone (ROCEPHIN)  IV 2 g (01/23/20 2150)  . [MAR Hold] lactated ringers    . [MAR Hold] vancomycin 750 mg (01/23/20 2244)     LOS: 2 days    Time spent: 25 mins    Pearline Yerby, MD [facility]   If 7PM-7AM, please contact night-coverage

## 2020-01-24 NOTE — Care Management Important Message (Signed)
Important Message  Patient Details  Name: Jasmine Morrow MRN: [id number] Date of Birth: December 19, 1945   Medicare Important Message Given:  Yes     Jasmine Morrow 01/24/2020, 11:44 AM

## 2020-01-24 NOTE — Anesthesia Postprocedure Evaluation (Signed)
Anesthesia Post Note  Patient: Jasmine Morrow  Procedure(s) Performed: TOTAL KNEE REVISION WITH SCAR DEBRIDEMENT/PATELLA REVISION WITH POLY EXCHANGE (Right Knee)  Patient location during evaluation: PACU Anesthesia Type: General Level of consciousness: oriented and awake and alert Pain management: pain level controlled Vital Signs Assessment: post-procedure vital signs reviewed and stable Respiratory status: spontaneous breathing, respiratory function stable and patient connected to nasal cannula oxygen Cardiovascular status: blood pressure returned to baseline and stable Postop Assessment: no headache, no backache and no apparent nausea or vomiting Anesthetic complications: no   No complications documented.   Last Vitals:  Vitals:   01/24/20 1900 01/24/20 1941  BP: 126/70 (!) 111/50  Pulse: 79 68  Resp:  18  Temp:  (!) 36.4 C  SpO2: 97% 98%    Last Pain:  Vitals:   01/24/20 1714  TempSrc: Oral  PainSc: 8                  Arita Miss

## 2020-01-25 DIAGNOSIS — L02415 Cutaneous abscess of right lower limb: Secondary | ICD-10-CM | POA: Diagnosis not present

## 2020-01-25 LAB — CBC
HCT: 27.8 % — ABNORMAL LOW (ref 36.0–46.0)
Hemoglobin: 8.3 g/dL — ABNORMAL LOW (ref 12.0–15.0)
MCH: 21.7 pg — ABNORMAL LOW (ref 26.0–34.0)
MCHC: 29.9 g/dL — ABNORMAL LOW (ref 30.0–36.0)
MCV: 72.6 fL — ABNORMAL LOW (ref 80.0–100.0)
Platelets: 492 10*3/uL — ABNORMAL HIGH (ref 150–400)
RBC: 3.83 MIL/uL — ABNORMAL LOW (ref 3.87–5.11)
RDW: 17.1 % — ABNORMAL HIGH (ref 11.5–15.5)
WBC: 11.7 10*3/uL — ABNORMAL HIGH (ref 4.0–10.5)
nRBC: 0 % (ref 0.0–0.2)

## 2020-01-25 LAB — BASIC METABOLIC PANEL
Anion gap: 7 (ref 5–15)
BUN: 10 mg/dL (ref 8–23)
CO2: 26 mmol/L (ref 22–32)
Calcium: 9.7 mg/dL (ref 8.9–10.3)
Chloride: 106 mmol/L (ref 98–111)
Creatinine, Ser: 0.8 mg/dL (ref 0.44–1.00)
GFR, Estimated: >60 mL/min (ref >60)
Glucose, Bld: 115 mg/dL — ABNORMAL HIGH (ref 70–99)
Potassium: 4.2 mmol/L (ref 3.5–5.1)
Sodium: 139 mmol/L (ref 135–145)

## 2020-01-25 LAB — URINE CULTURE: Culture: NO GROWTH

## 2020-01-25 LAB — AEROBIC CULTURE W GRAM STAIN (SUPERFICIAL SPECIMEN): Culture: NORMAL

## 2020-01-25 LAB — PHOSPHORUS: Phosphorus: 3.3 mg/dL (ref 2.5–4.6)

## 2020-01-25 LAB — MAGNESIUM: Magnesium: 1.8 mg/dL (ref 1.7–2.4)

## 2020-01-25 MED ORDER — CARVEDILOL 6.25 MG PO TABS
3.1250 mg | ORAL_TABLET | Freq: Two times a day (BID) | ORAL | Status: DC
  Administered 2020-01-25 – 2020-01-31 (×12): 3.125 mg via ORAL
  Filled 2020-01-25 (×12): qty 1

## 2020-01-25 MED ORDER — PHENAZOPYRIDINE HCL 200 MG PO TABS
200.0000 mg | ORAL_TABLET | Freq: Three times a day (TID) | ORAL | Status: DC | PRN
  Filled 2020-01-25 (×2): qty 1

## 2020-01-25 MED ORDER — RISAQUAD PO CAPS
1.0000 | ORAL_CAPSULE | Freq: Every day | ORAL | Status: DC
  Administered 2020-01-25 – 2020-01-31 (×7): 1 via ORAL
  Filled 2020-01-25 (×7): qty 1

## 2020-01-25 MED ORDER — ENSURE ENLIVE PO LIQD
237.0000 mL | Freq: Two times a day (BID) | ORAL | Status: DC
  Administered 2020-01-25 – 2020-01-31 (×10): 237 mL via ORAL

## 2020-01-25 NOTE — Plan of Care (Signed)

## 2020-01-25 NOTE — Progress Notes (Signed)
PT Cancellation Note  Patient Details Name: DEDRA MATSUO MRN: [id number] DOB: 03-Feb-1945   Cancelled Treatment:    Reason Eval/Treat Not Completed: Patient declined, no reason specified   PT brought in knee immobilizer as requested by physician. Pateint reclining in bed and husband in chair nearby. Patient has red eyes as if she has been crying and states they have prayed on it and want to delay PT eval until tomorrow due to being uncertain about safety of getting up. PT explained that she was no longer unclear about Dr. Clydell Hakim desired precautions for R knee, but that if patient preferred we could wait until tomorrow. She was a bit hesitant but appeared to be declining. PT asked if she needed anything including just talking since she looked close to tearful. At some point her husband started talking about how she had been telling too long a story the first time this PT attempted eval earlier today. PT told him that she was the patient and everything she wanted to tell me was relevant and the PT didn't mind hearing it if it was important to her. PT mentioned there seemed to be some conflict between them earlier that may be upsetting to her. Husband got angry and said PT was lying that there was not conflict and he was leaving. Stated he was going to report PT. PT stated she had seen the conflict between them at the first PT attempt (him cutting her off and telling her that she shouldn't be talking, taking control of the conversation, that made her visibly upset). PT explained that it is PT's job to hear the patient and attend to her needs. Husband continued to go on about how he would not be disrespected by "a little twit" and started getting ready to leave. PT clarified with patient that she did not want PT at this time and had what she needed. Re-emphasized that the patient's wishes were important. Patient's husband is hard of hearing, so much of this conversation required elevated voices  Patient  declined PT at this time. Left immobilizer for later use. Will re-attempt PT eval at later time/date.  Of note, PT did spend a lot of time with the patient, her husband, and on the phone with their son Pieter Partridge (per patient's husband's request) at last attempt and made every effort to clarify precautions and explain role of PT in acute care setting., which was of concern to family. Did contact Dr. Marry Guan and was instructed by him that patient is to have immobilizer on R knee when out of bed and is WBAT on that side. Patient's husband was very directive and protective of his wife for whom he appears to be the primary caregiver.   Everlean Alstrom. Graylon Good, PT, DPT 01/25/20, 2:39 PM

## 2020-01-25 NOTE — Progress Notes (Signed)
PT Cancellation Note  Patient Details Name: Jasmine Morrow MRN: [id number] DOB: Sep 07, 1945   Cancelled Treatment:    Reason Eval/Treat Not Completed: Other (comment) (Patient's husband and son adamant that Dr. Marry Guan said not to let PT bend her knee and patient said she was told something similar. PT orders say "do not work on ROM." Clarified with Dr. Marry Guan by chat. He states she is WBAT R LE and needs to have immobilizer on when out of bed. No immobilizer in the room. Will hold until immobilizer present and will re-attempt at later time/date as appropriate. Will check on immobilizer order)  Everlean Alstrom. Graylon Good, PT, DPT 01/25/20, 12:57 PM

## 2020-01-25 NOTE — Progress Notes (Signed)
PROGRESS NOTE    Jasmine Morrow  [id number] DOB: Mar 11, 1945 DOA: 01/22/2020 PCP: Rusty Aus, MD   Brief Narrative:  This 74 years old female with PMH of asthma, hypertension, obstructive sleep apnea, GERD, anxiety presents to the emergency department with acute onset of right ankle swelling and pain with draining wound which has developed over the last 5 days.  Patient was seen in the wound care clinic and was sent to the emergency department after she was found to have significant drainage from her wound.  Patient reports that she has complication from right ankle surgery and hardware infection and underwent several debridement surgeries in this year. Patient is admitted for sepsis secondary to wound infection.  Patient was started on ceftriaxone and vancomycin,  infectious disease and orthopedics consulted.  She is found to have anemia , has received 1 unit PRBC. Hb 8.8.  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 Postoperative day 1.  Assessment & Plan:   Active Problems:   Abscess of right knee   Sepsis secondary to right knee abscess with right knee effusion: - Patient presented with tachycardia, tachypnea, hypotension, lactic acid 2.7, obvious source of infection. -The patient will be admitted to a medical monitored bed. -Continue empiric antibiotics with IV vancomycin and Rocephin. -Blood cultures and wound cultures no growth so far.  Continue to check. -Adequate pain control. -Orthopedics Dr. Marry Guan consulted.   - Patient underwent total knee revision with a scar debridement and patella revision with polyexchange postoperative day 1. - Infectious disease was consulted, recommended to continue current antibiotics,  awaiting blood and wound cultures.   2.  Essential hypertension Resume Coreg.  Blood pressure is improving.  3.  Depression.    Continue Lexapro  4.  GERD.    Continue Protonix.  5.  Asthma.     Continue as needed albuterol  6.  Microcytic hypochromic anemia. Baseline hemoglobin runs between 8 and 9. Hemoglobin 7.3.  S/p Transfuse 1 PRBC .  Hemoglobin 8.3  Obtain iron profile.   DVT prophylaxis: Lovenox Code Status: Full code Family Communication: No family at bedside Disposition Plan:   Status is: Inpatient  Remains inpatient appropriate because:Inpatient level of care appropriate due to severity of illness   Dispo: The patient is from: Home              Anticipated d/c is to: SNF              Anticipated d/c date is: > 3 days              Patient currently is not medically stable to d/c.  Consultants:   Orthopedics  Infectious disease  Procedures: None Antimicrobials:  Anti-infectives (From admission, onward)   Start     Dose/Rate Route Frequency Ordered Stop   01/24/20 1235  vancomycin (VANCOCIN) powder  Status:  Discontinued          As needed 01/24/20 1235 01/24/20 1434   01/24/20 1234  gentamicin (GARAMYCIN) injection  Status:  Discontinued          As needed 01/24/20 1234 01/24/20 1434   01/23/20 2200  cefTRIAXone (ROCEPHIN) 2 g in sodium chloride 0.9 % 100 mL IVPB        2 g 200 mL/hr over 30 Minutes Intravenous Every 24 hours 01/23/20 0935     01/23/20 2200  vancomycin (VANCOREADY) IVPB 750 mg/150 mL        750 mg 150 mL/hr  over 60 Minutes Intravenous Every 12 hours 01/23/20 0959     01/23/20 0800  vancomycin (VANCOCIN) IVPB 1000 mg/200 mL premix  Status:  Discontinued        1,000 mg 200 mL/hr over 60 Minutes Intravenous Every 12 hours 01/23/20 0331 01/23/20 0959   01/22/20 2230  vancomycin (VANCOCIN) IVPB 1000 mg/200 mL premix  Status:  Discontinued        1,000 mg 200 mL/hr over 60 Minutes Intravenous  Once 01/22/20 2220 01/23/20 0033   01/22/20 2230  ceFAZolin (ANCEF) 2 g in dextrose 5 % 100 mL IVPB  Status:  Discontinued        2 g 240 mL/hr over 30 Minutes Intravenous Every 8 hours 01/22/20 2220 01/23/20 0935   01/22/20 2145  vancomycin  (VANCOCIN) IVPB 1000 mg/200 mL premix        1,000 mg 200 mL/hr over 60 Minutes Intravenous  Once 01/22/20 2139 01/22/20 2348   01/22/20 2145  cefTRIAXone (ROCEPHIN) 2 g in sodium chloride 0.9 % 100 mL IVPB        2 g 200 mL/hr over 30 Minutes Intravenous  Once 01/22/20 2139 01/22/20 2229      Subjective: Patient was seen and examined at bedside.  Overnight events noted.   Patient status post ORIF postoperative day 1.  She reports still having pain but better. She denies any other concerns. Objective: Vitals:   01/24/20 2340 01/25/20 0533 01/25/20 0958 01/25/20 1118  BP: 122/60 (!) 119/55 (!) 125/54 (!) 116/52  Pulse: 60 76 71 79  Resp: 18 20 14 16   Temp:  (!) 97.4 F (36.3 C) 97.9 F (36.6 C) 97.8 F (36.6 C)  TempSrc: Oral   Oral  SpO2: 100% 100% 100% 100%  Weight:      Height:        Intake/Output Summary (Last 24 hours) at 01/25/2020 1430 Last data filed at 01/25/2020 1035 Gross per 24 hour  Intake 340 ml  Output 855 ml  Net -515 ml   Filed Weights   01/23/20 0210  Weight: 88.5 kg    Examination:  General exam: Appears calm and comfortable, not in any acute distress. Respiratory system: Clear to auscultation. Respiratory effort normal. Cardiovascular system: S1 & S2 heard, RRR. No JVD, murmurs, rubs, gallops or clicks. No pedal edema. Gastrointestinal system: Abdomen is nondistended, soft and nontender. No organomegaly or masses felt. Normal bowel sounds heard. Central nervous system: Alert and oriented. No focal neurological deficits. Extremities: Right leg in dressing.  In immobilizer.   Skin: No rashes, lesions or ulcers Psychiatry: Judgement and insight appear normal. Mood & affect appropriate.     Data Reviewed: I have personally reviewed following labs and imaging studies  CBC: Recent Labs  Lab 01/22/20 1915 01/23/20 0540 01/23/20 0824 01/23/20 1958 01/24/20 0538 01/25/20 0450  WBC 15.5* 9.9  --   --   --  11.7*  NEUTROABS 12.8*  --   --    --   --   --   HGB 10.0* 7.7* 7.3* 8.3* 8.8* 8.3*  HCT 33.2* 25.1* 24.8* 26.7* 28.5* 27.8*  MCV 70.2* 69.7*  --   --   --  72.6*  PLT 797* 668*  --   --   --  010*   Basic Metabolic Panel: Recent Labs  Lab 01/22/20 1915 01/23/20 0540 01/24/20 0538 01/25/20 0450  NA 137 140 142 139  K 4.1 3.8 3.9 4.2  CL 102 108 110 106  CO2 22 26  25 26  GLUCOSE 132* 102* 94 115*  BUN 13 12 9 10   CREATININE 0.86 0.83 0.71 0.80  CALCIUM 10.5* 9.8 9.6 9.7  MG  --   --  1.9 1.8  PHOS  --   --  3.8 3.3   GFR: Estimated Creatinine Clearance: 74.3 mL/min (by C-G formula based on SCr of 0.8 mg/dL). Liver Function Tests: Recent Labs  Lab 01/22/20 1915  AST 20  ALT 11  ALKPHOS 124  BILITOT 0.6  PROT 7.8  ALBUMIN 3.1*   No results for input(s): LIPASE, AMYLASE in the last 168 hours. No results for input(s): AMMONIA in the last 168 hours. Coagulation Profile: Recent Labs  Lab 01/22/20 1915  INR 1.2   Cardiac Enzymes: No results for input(s): CKTOTAL, CKMB, CKMBINDEX, TROPONINI in the last 168 hours. BNP (last 3 results) No results for input(s): PROBNP in the last 8760 hours. HbA1C: No results for input(s): HGBA1C in the last 72 hours. CBG: No results for input(s): GLUCAP in the last 168 hours. Lipid Profile: No results for input(s): CHOL, HDL, LDLCALC, TRIG, CHOLHDL, LDLDIRECT in the last 72 hours. Thyroid Function Tests: No results for input(s): TSH, T4TOTAL, FREET4, T3FREE, THYROIDAB in the last 72 hours. Anemia Panel: Recent Labs    01/23/20 0540  FERRITIN 66  TIBC 224*  IRON 18*   Sepsis Labs: Recent Labs  Lab 01/22/20 1915 01/22/20 2202  LATICACIDVEN 2.7* 2.3*    Recent Results (from the past 240 hour(s))  Aerobic Culture (superficial specimen)     Status: None   Collection Time: 01/22/20  1:50 PM   Specimen: KNEE  Result Value Ref Range Status   Specimen Description   Final    KNEE Performed at [facility], [address], [place], [place]  [address]    Special Requests   Final    NONE Performed at [facility], [place] [address] [place], [place] [address]    Gram Stain   Final    RARE WBC PRESENT,BOTH PMN AND MONONUCLEAR NO ORGANISMS SEEN    Culture   Final    RARE NORMAL SKIN FLORA Performed at [facility], [place] [address] [place], [place] [address]    Report Status 01/25/2020 FINAL  Final  Culture, blood (Routine x 2)     Status: None (Preliminary result)   Collection Time: 01/22/20  7:15 PM   Specimen: BLOOD  Result Value Ref Range Status   Specimen Description BLOOD BLOOD RIGHT HAND  Final   Special Requests   Final    BOTTLES DRAWN AEROBIC ONLY Blood Culture results may not be optimal due to an inadequate volume of blood received in culture bottles   Culture   Final    NO GROWTH 3 DAYS Performed at [facility], [address] [place],  [address]    Report Status PENDING  Incomplete  Culture, blood (Routine x 2)     Status: None (Preliminary result)   Collection Time: 01/22/20 10:02 PM   Specimen: BLOOD  Result Value Ref Range Status   Specimen Description BLOOD RIGHT ANTECUBITAL  Final   Special Requests   Final    BOTTLES DRAWN AEROBIC AND ANAEROBIC Blood Culture adequate volume   Culture   Final    NO GROWTH 3 DAYS Performed at [facility], [address] [place],  [address]    Report Status PENDING  Incomplete  Resp Panel by RT-PCR (Flu A&B, Covid) Nasopharyngeal Swab  Status: None   Collection Time: 01/22/20 10:02 PM   Specimen: Nasopharyngeal Swab; Nasopharyngeal(NP) swabs in vial transport medium  Result Value Ref Range Status   SARS Coronavirus 2 by RT PCR NEGATIVE NEGATIVE Final    Comment: (NOTE) SARS-CoV-2 target nucleic acids are NOT DETECTED.  The SARS-CoV-2 RNA is generally detectable in upper respiratory specimens during the acute phase of infection. The lowest concentration of SARS-CoV-2 viral copies this assay can detect is 138  copies/mL. A negative result does not preclude SARS-Cov-2 infection and should not be used as the sole basis for treatment or other patient management decisions. A negative result may occur with  improper specimen collection/handling, submission of specimen other than nasopharyngeal swab, presence of viral mutation(s) within the areas targeted by this assay, and inadequate number of viral copies(<138 copies/mL). A negative result must be combined with clinical observations, patient history, and epidemiological information. The expected result is Negative.  Fact Sheet for Patients:  EntrepreneurPulse.com.au  Fact Sheet for Healthcare Providers:  IncredibleEmployment.be  This test is no t yet approved or cleared by the [place] FDA and  has been authorized for detection and/or diagnosis of SARS-CoV-2 by FDA under an Emergency Use Authorization (EUA). This EUA will remain  in effect (meaning this test can be used) for the duration of the COVID-19 declaration under Section 564(b)(1) of the Act, 21 U.S.C.section 360bbb-3(b)(1), unless the authorization is terminated  or revoked sooner.       Influenza A by PCR NEGATIVE NEGATIVE Final   Influenza B by PCR NEGATIVE NEGATIVE Final    Comment: (NOTE) The Xpert Xpress SARS-CoV-2/FLU/RSV plus assay is intended as an aid in the diagnosis of influenza from Nasopharyngeal swab specimens and should not be used as a sole basis for treatment. Nasal washings and aspirates are unacceptable for Xpert Xpress SARS-CoV-2/FLU/RSV testing.  Fact Sheet for Patients: EntrepreneurPulse.com.au  Fact Sheet for Healthcare Providers: IncredibleEmployment.be  This test is not yet approved or cleared by the [place] FDA and has been authorized for detection and/or diagnosis of SARS-CoV-2 by FDA under an Emergency Use Authorization (EUA). This EUA will remain in effect (meaning  this test can be used) for the duration of the COVID-19 declaration under Section 564(b)(1) of the Act, 21 U.S.C. section 360bbb-3(b)(1), unless the authorization is terminated or revoked.  Performed at [facility], [address] [place], [place] [address]   Aerobic Culture (superficial specimen)     Status: None (Preliminary result)   Collection Time: 01/23/20  7:02 PM   Specimen: Wound  Result Value Ref Range Status   Specimen Description   Final    WOUND Performed at [facility], [address] [place], [place] [address]    Special Requests   Final    NONE Performed at [facility], [address] [place], [place] [address]    Gram Stain   Final    FEW WBC PRESENT, PREDOMINANTLY PMN NO ORGANISMS SEEN    Culture   Final    CULTURE REINCUBATED FOR BETTER GROWTH Performed at [facility], [place] [address] [place], [place] [address]    Report Status PENDING  Incomplete  Anaerobic culture     Status: None (Preliminary result)   Collection Time: 01/24/20 11:43 AM   Specimen: PATH Other; Wound  Result Value Ref Range Status   Specimen Description SYNOVIAL RIGHT KNEE  Final   Special Requests   Final    RECEIVED IN STERILE CUP Performed at [facility], [place] [address], [place], [place] [address]    Gram Stain PENDING  Incomplete   Culture PENDING  Incomplete   Report Status PENDING  Incomplete  Gram stain     Status: None   Collection Time: 01/24/20 11:43 AM   Specimen: PATH Other; Wound  Result Value Ref Range Status   Specimen Description WOUND  Final   Special Requests NONE  Final   Gram Stain   Final    MODERATE WBC SEEN NO ORGANISMS SEEN RESULT CALLED TO, READ BACK BY AND VERIFIED WITH: Caralee Ates AT 1218 01/24/20 SDR Performed at [facility], [address], [place], [place] [address]    Report Status 01/24/2020 FINAL  Final  Aerobic/Anaerobic Culture (surgical/deep wound)     Status: None  (Preliminary result)   Collection Time: 01/24/20 11:43 AM   Specimen: Synovial, Right Knee; Body Fluid  Result Value Ref Range Status   Specimen Description WOUND RIGHT KNEE  Final   Special Requests SUPERFICIAL SWAB  Final   Gram Stain PENDING  Incomplete   Culture   Final    NO GROWTH < 12 HOURS Performed at [facility], [place] [address], [place], [place] [address]    Report Status PENDING  Incomplete  Body fluid culture     Status: None (Preliminary result)   Collection Time: 01/24/20 11:43 AM   Specimen: Synovium  Result Value Ref Range Status   Specimen Description SYNOVIAL FLUID RIGHT KNEE  Final   Special Requests RECEIVED IN A CUP  Final   Gram Stain   Final    ABUNDANT WBC PRESENT, PREDOMINANTLY PMN NO ORGANISMS SEEN    Culture   Final    NO GROWTH < 12 HOURS Performed at [facility], [place] [address], [place], [place] [address]    Report Status PENDING  Incomplete  Gram stain     Status: None   Collection Time: 01/24/20 12:15 PM   Specimen: Wound  Result Value Ref Range Status   Specimen Description WOUND  Final   Special Requests C RIGHT KNEE  SUPRA PATELLAR POUCH   Final   Gram Stain   Final    MODERATE WBC SEEN NO ORGANISMS SEEN RESULT CALLED TO, READ BACK BY AND VERIFIED WITHCaralee Ates AT [id number] 01/24/20 SDR Performed at [facility], [address] [place], [place] [address]    Report Status 01/24/2020 FINAL  Final  Aerobic/Anaerobic Culture (surgical/deep wound)     Status: None (Preliminary result)   Collection Time: 01/24/20 12:15 PM   Specimen: Wound  Result Value Ref Range Status   Specimen Description WOUND RIGHT KNEE  Final   Special Requests SPECIMEN C SUPRA PATELLAR POUCH  Final   Gram Stain PENDING  Incomplete   Culture   Final    NO GROWTH < 12 HOURS Performed at [facility], [place] [address] [place], [place] [address]    Report Status PENDING  Incomplete  Gram stain     Status: None   Collection Time:  01/24/20 12:20 PM   Specimen: Wound  Result Value Ref Range Status   Specimen Description WOUND  Final   Special Requests D RIGHT  KNEE LATERAL GUTTEN  Final   Gram Stain   Final    FEW WBC SEEN NO ORGANISMS SEEN TERESA BENSON AT [id number] 01/24/20 SDR Performed at [facility], [address], [place], [place] [address]    Report Status 01/24/2020 FINAL  Final  Aerobic/Anaerobic Culture (surgical/deep wound)     Status: None (Preliminary result)   Collection Time: 01/24/20 12:20 PM   Specimen: Wound  Result Value Ref Range Status   Specimen Description   Final    WOUND Performed at [facility], [address] [place], [place] [address]    Special Requests  SPECIMEN D RIGHT KNEE LATERAL GUTTERS  Final   Gram Stain PENDING  Incomplete   Culture   Final    NO GROWTH < 12 HOURS Performed at [facility], [place] [address], [place], [place] [address]    Report Status PENDING  Incomplete  Gram stain     Status: None   Collection Time: 01/24/20 12:22 PM   Specimen: Wound  Result Value Ref Range Status   Specimen Description WOUND  Final   Special Requests E RIGHT KNEE MEDIAL GUTTEN  Final   Gram Stain   Final    FEW WBC SEEN NO ORGANISMS SEEN RESULT CALLED TO, READ BACK BY AND VERIFIED WITHCaralee Ates AT [id number] 01/24/20 SDR Performed at [facility], [address], [place], [place] [address]    Report Status 01/24/2020 FINAL  Final  Aerobic/Anaerobic Culture (surgical/deep wound)     Status: None (Preliminary result)   Collection Time: 01/24/20 12:22 PM   Specimen: Wound  Result Value Ref Range Status   Specimen Description   Final    WOUND Performed at [facility], [address], [place], [place] [address]    Special Requests  SPECIMENE RIGHT KNEE MEDIAL GUTTEN  Final   Gram Stain PENDING  Incomplete   Culture   Final    NO GROWTH < 12 HOURS Performed at [facility], [place] [address], [place], [place] [address]     Report Status PENDING  Incomplete  Gram stain     Status: None   Collection Time: 01/24/20 12:24 PM   Specimen: Wound  Result Value Ref Range Status   Specimen Description WOUND  Final   Special Requests F RIGHT KNEE POSTERIOR RECESS  Final   Gram Stain   Final    MANY WBC SEEN NO ORGANISMS SEEN RESULT CALLED TO, READ BACK BY AND VERIFIED WITHCaralee Ates AT [id number] 01/24/20 SDR Performed at [facility], [address], [place], [place] [address]    Report Status 01/24/2020 FINAL  Final  Aerobic/Anaerobic Culture (surgical/deep wound)     Status: None (Preliminary result)   Collection Time: 01/24/20 12:24 PM   Specimen: Wound  Result Value Ref Range Status   Specimen Description   Final    WOUND Performed at [facility], [address] [place], [place] [address]    Special Requests  SPECIMENF RIGHT KNEE POSTERIOI RECESS  Final   Gram Stain PENDING  Incomplete   Culture   Final    NO GROWTH < 12 HOURS Performed at [facility], [place] [address] [place], [place] [address]    Report Status PENDING  Incomplete    Radiology Studies: No results found. Scheduled Meds: . acidophilus  1 capsule Oral Daily  . vitamin C  500 mg Oral BID  . calcium-vitamin D  2 tablet Oral Daily  . enoxaparin (LOVENOX) injection  30 mg Subcutaneous Q12H  . escitalopram  10 mg Oral Daily  . feeding supplement  237 mL Oral BID BM  . ferrous sulfate  325 mg Oral BID WC  . influenza vaccine adjuvanted  0.5 mL  Intramuscular Tomorrow-1000  . loratadine  10 mg Oral Daily  . magnesium hydroxide  30 mL Oral Daily  . multivitamin with minerals  1 tablet Oral Daily  . multivitamin-lutein  1 capsule Oral BID  . pantoprazole  40 mg Oral BID   Continuous Infusions: . sodium chloride 100 mL/hr at 01/24/20 1815  . cefTRIAXone (ROCEPHIN)  IV 2 g (01/24/20 2205)  . tranexamic acid    . vancomycin 750 mg (01/25/20 1020)     LOS: 3 days    Time spent: 25 mins    Keshana Klemz, MD [facility]   If 7PM-7AM, please contact night-coverage

## 2020-01-25 NOTE — Progress Notes (Signed)
  Subjective: 1 Day Post-Op Procedure(s) (LRB): TOTAL KNEE REVISION WITH SCAR DEBRIDEMENT/PATELLA REVISION WITH POLY EXCHANGE (Right) Patient reports pain as moderate.   Patient is well, and has had no acute complaints or problems Negative for chest pain and shortness of breath Fever: no Gastrointestinal: negative for nausea and vomiting.   Patient has not had a bowel movement but has been passing gas.  Objective: Vital signs in last 24 hours: Temp:  [96.9 F (36.1 C)-97.9 F (36.6 C)] 97.9 F (36.6 C) (12/25 0958) Pulse Rate:  [60-88] 71 (12/25 0958) Resp:  [6-28] 14 (12/25 0958) BP: (109-128)/(50-70) 125/54 (12/25 0958) SpO2:  [96 %-100 %] 100 % (12/25 0958)  Intake/Output from previous day:  Intake/Output Summary (Last 24 hours) at 01/25/2020 1056 Last data filed at 01/25/2020 1035 Gross per 24 hour  Intake 1640 ml  Output 1755 ml  Net -115 ml    Intake/Output this shift: Total I/O In: 240 [P.O.:240] Out: -   Labs: Recent Labs    01/23/20 0540 01/23/20 0824 01/23/20 1958 01/24/20 0538 01/25/20 0450  HGB 7.7* 7.3* 8.3* 8.8* 8.3*   Recent Labs    01/23/20 0540 01/23/20 0824 01/24/20 0538 01/25/20 0450  WBC 9.9  --   --  11.7*  RBC 3.60*  --   --  3.83*  HCT 25.1*   < > 28.5* 27.8*  PLT 668*  --   --  492*   < > = values in this interval not displayed.   Recent Labs    01/24/20 0538 01/25/20 0450  NA 142 139  K 3.9 4.2  CL 110 106  CO2 25 26  BUN 9 10  CREATININE 0.71 0.80  GLUCOSE 94 115*  CALCIUM 9.6 9.7   Recent Labs    01/22/20 1915  INR 1.2     EXAM General - Patient is Alert, Appropriate and Oriented Extremity - Neurovascular intact Dorsiflexion/Plantar flexion intact Compartment soft Dressing/Incision -Postoperative dressing remains in place., Polar Care in place and working. , Hemovac in place. ~50 mL of serosanguinous fluid in canister Motor Function - intact, moving foot and toes well on exam.  Cardiovascular- Regular rate  and rhythm, no murmurs/rubs/gallops Respiratory- Lungs clear to auscultation bilaterally Gastrointestinal- soft, nontender and hypoactive bowel sounds  Exam of right knee: No erythema, mild warmth. No pain with gentle passive ROM.   Assessment/Plan: 1 Day Post-Op Procedure(s) (LRB): TOTAL KNEE REVISION WITH SCAR DEBRIDEMENT/PATELLA REVISION WITH POLY EXCHANGE (Right) Active Problems:   Abscess of right knee  Estimated body mass index is 28.81 kg/m as calculated from the following:   Height as of this encounter: 5\' 9"  (1.753 m).   Weight as of this encounter: 88.5 kg. Advance diet Up with therapy-no ROM of the R knee.    DVT Prophylaxis - Lovenox, Ted hose and foot pumps Weight-Bearing as tolerated to right leg  Cassell Smiles, PA-C [facility] Orthopaedic Surgery 01/25/2020, 10:56 AM

## 2020-01-26 ENCOUNTER — Encounter: Payer: Self-pay | Admitting: Orthopedic Surgery

## 2020-01-26 DIAGNOSIS — L02415 Cutaneous abscess of right lower limb: Secondary | ICD-10-CM | POA: Diagnosis not present

## 2020-01-26 LAB — CBC
HCT: 28.8 % — ABNORMAL LOW (ref 36.0–46.0)
Hemoglobin: 8.5 g/dL — ABNORMAL LOW (ref 12.0–15.0)
MCH: 21.6 pg — ABNORMAL LOW (ref 26.0–34.0)
MCHC: 29.5 g/dL — ABNORMAL LOW (ref 30.0–36.0)
MCV: 73.3 fL — ABNORMAL LOW (ref 80.0–100.0)
Platelets: 485 10*3/uL — ABNORMAL HIGH (ref 150–400)
RBC: 3.93 MIL/uL (ref 3.87–5.11)
RDW: 17.6 % — ABNORMAL HIGH (ref 11.5–15.5)
WBC: 10.5 10*3/uL (ref 4.0–10.5)
nRBC: 0 % (ref 0.0–0.2)

## 2020-01-26 LAB — BASIC METABOLIC PANEL WITH GFR
Anion gap: 7 (ref 5–15)
BUN: 11 mg/dL (ref 8–23)
CO2: 26 mmol/L (ref 22–32)
Calcium: 10.3 mg/dL (ref 8.9–10.3)
Chloride: 103 mmol/L (ref 98–111)
Creatinine, Ser: 0.66 mg/dL (ref 0.44–1.00)
GFR, Estimated: >60 mL/min
Glucose, Bld: 103 mg/dL — ABNORMAL HIGH (ref 70–99)
Potassium: 3.8 mmol/L (ref 3.5–5.1)
Sodium: 136 mmol/L (ref 135–145)

## 2020-01-26 LAB — PHOSPHORUS: Phosphorus: 2.9 mg/dL (ref 2.5–4.6)

## 2020-01-26 LAB — MAGNESIUM: Magnesium: 2.1 mg/dL (ref 1.7–2.4)

## 2020-01-26 NOTE — Evaluation (Addendum)
Physical Therapy Evaluation Patient Details Name: Jasmine Morrow MRN: [id number] DOB: [birth year] Today's Date: 01/26/2020   History of Present Illness  Pt is a 74 y/o F who was admitted on 01/22/20 after presenting to ED with R ankle swelling, pain, & draining wound which has developed over the last 5 days. Pt admitted for sepsis 2/2 wound infection. 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 PMH: asthma, HTN, OSA, GERD, anxiety  Clinical Impression  Pt received in bed initially reporting inability to participate as she is already set up for PT with another agency. PT educated pt on purpose of visit & pt requested PT speak to husband. Pt contacted husband & PT spoke to him via speakerphone at pt's bedside. PT educated pt husband Yvone Neu) on purpose of PT visit, RLE precautions (NO ROM to R knee, knee immobilizer when OOB, & RLE WBAT). Yvone Neu reports he is agreeable to PT working with pt but wants everyone to be "kind, compassionate, and patient" with pt. Yvone Neu also states "if she falls I will be very upset". Phone call ended & pt agreeable to tx. PT provides total assist for donning/doffing R knee immobilizer & educating pt on it's use. Pt agreeable to supine<>sit & requires min assist, but defers standing. After sitting EOB ~1 min pt returns supine & is able to scoot to [facility] with extra time, bed rails, and bed flat with cuing to push with LLE. Pt notes fatigue after activity & also reports her & her husband have been managing at home with pt able to perform little physical mobility. At this time, recommending HHPT f/u upon d/c from hospital as pt eager to re-start services with advanced home care. Will continue to see pt to progress transfers & strengthening as able.  PT educated pt on need to transfer sit>stand from elevated surfaces 2/2 inability to flex R knee at this time.    Follow Up Recommendations Home health PT;Supervision for  mobility/OOB;Supervision/Assistance - 24 hour    Equipment Recommendations  None recommended by PT (pt already has all DME needs)    Recommendations for Other Services       Precautions / Restrictions Precautions Precautions: Fall Precaution Comments: NO ROM to R Knee Required Braces or Orthoses: Knee Immobilizer - Right Knee Immobilizer - Right: On when out of bed or walking Restrictions Weight Bearing Restrictions: Yes RLE Weight Bearing: Weight bearing as tolerated      Mobility  Bed Mobility Overal bed mobility: Needs Assistance Bed Mobility: Supine to Sit;Sit to Supine     Supine to sit: Min assist;HOB elevated Sit to supine: Min assist   General bed mobility comments: PT supports RLE throughout movement otherwise pt able to complete supine<>sit    Transfers Overall transfer level:  (pt deferred)                  Ambulation/Gait                Stairs            Wheelchair Mobility    Modified Rankin (Stroke Patients Only)       Balance Overall balance assessment: Needs assistance Sitting-balance support: No upper extremity supported;Bilateral upper extremity supported Sitting balance-Leahy Scale: Fair Sitting balance - Comments: supervision static sitting EOB  Pertinent Vitals/Pain Pain Assessment: 0-10 Pain Score: 8  Pain Location: RLE Pain Descriptors / Indicators: Discomfort;Grimacing Pain Intervention(s): Limited activity within patient's tolerance;Monitored during session;Premedicated before session (pt reports she is premedicated)    Home Living Family/patient expects to be discharged to:: Private residence Living Arrangements: Spouse/significant other Available Help at Discharge: Family;Available 24 hours/day Type of Home: House Home Access: Stairs to enter;Ramped entrance (pt reports small threshold step to enter town home then notes her husband built a small ramp over  it) Entrance Stairs-Rails: None Entrance Stairs-Number of Steps: 1 Home Layout: One level Home Equipment: Environmental consultant - 4 wheels;Bedside commode;Wheelchair - manual;Hospital bed Additional Comments: Pt reports she was working with [facility] prior to admission & was walking a max of 65 ft with FWW. Pt reports she either uses a depends or transfers to [facility] to void bowel/bladder. Pt has w/c at home.    Prior Function Level of Independence: Needs assistance               Hand Dominance        Extremity/Trunk Assessment   Upper Extremity Assessment Upper Extremity Assessment: Overall WFL for tasks assessed    Lower Extremity Assessment Lower Extremity Assessment: Generalized weakness (RLE not formally tested)       Communication   Communication: No difficulties  Cognition Arousal/Alertness: Awake/alert Behavior During Therapy: WFL for tasks assessed/performed Overall Cognitive Status: Within Functional Limits for tasks assessed                                        General Comments      Exercises     Assessment/Plan    PT Assessment Patient needs continued PT services  PT Problem List Decreased strength;Decreased balance;Decreased knowledge of precautions;Pain;Decreased range of motion;Decreased mobility;Decreased activity tolerance;Decreased skin integrity;Decreased knowledge of use of DME       PT Treatment Interventions DME instruction;Functional mobility training;Balance training;Patient/family education;Gait training;Modalities;Therapeutic activities;Neuromuscular re-education;Manual techniques;Therapeutic exercise;Stair training;Wheelchair mobility training    PT Goals (Current goals can be found in the Care Plan section)  Acute Rehab PT Goals Patient Stated Goal: "be patient with me" PT Goal Formulation: With patient/family Time For Goal Achievement: 02/09/20 Potential to Achieve Goals: Fair    Frequency BID   Barriers to discharge         Co-evaluation               AM-PAC PT "6 Clicks" Mobility  Outcome Measure Help needed turning from your back to your side while in a flat bed without using bedrails?: A Little Help needed moving from lying on your back to sitting on the side of a flat bed without using bedrails?: A Lot Help needed moving to and from a bed to a chair (including a wheelchair)?: Total Help needed standing up from a chair using your arms (e.g., wheelchair or bedside chair)?: Total Help needed to walk in hospital room?: Total Help needed climbing 3-5 steps with a railing? : Total 6 Click Score: 9    End of Session Equipment Utilized During Treatment: Right knee immobilizer Activity Tolerance: Patient limited by fatigue;Patient tolerated treatment well;Patient limited by pain Patient left: in bed;with call bell/phone within reach;with bed alarm set (BLE foot pumps donned, RLE polar care donned) Nurse Communication: Mobility status;Precautions PT Visit Diagnosis: Muscle weakness (generalized) (M62.81);Difficulty in walking, not elsewhere classified (R26.2);Pain Pain - Right/Left: Right Pain - part of body:  Leg    Time: [id number] PT Time Calculation (min) (ACUTE ONLY): 39 min   Charges:   PT Evaluation $PT Eval Low Complexity: 1 Low PT Treatments $Therapeutic Activity: 23-37 mins        Lavone Nian, PT, DPT 01/26/20, 10:55 AM   [name] 01/26/2020, 10:49 AM

## 2020-01-26 NOTE — Progress Notes (Signed)
Ice replenished in cooler, patient required stronger pain medication this morning, iv pain medication administered.

## 2020-01-26 NOTE — Progress Notes (Signed)
PROGRESS NOTE    Jasmine Morrow  [id number] DOB: [birth year] DOA: 01/22/2020 PCP: Rusty Aus, MD   Brief Narrative:  This 74 years old female with PMH of asthma, hypertension, obstructive sleep apnea, GERD, anxiety presents to the emergency department with acute onset of right ankle swelling and pain with draining wound which has developed over the last 5 days.  Patient was seen in the wound care clinic and was sent to the emergency department after she was found to have significant drainage from her wound.  Patient reports that she has complication from right ankle surgery and hardware infection and underwent several debridement surgeries in this year. Patient is admitted for sepsis secondary to wound infection.  Patient was started on ceftriaxone and vancomycin,  infectious disease and orthopedics consulted.  She is found to have anemia , has received 1 unit PRBC. Hb 8.8. 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 Postoperative day 2.  Assessment & Plan:   Active Problems:   Abscess of right knee   Sepsis secondary to right knee abscess with right knee effusion: - Patient presented with tachycardia, tachypnea, hypotension, lactic acid 2.7, obvious source of infection. - Admitted to medical monitored bed. - Continue empiric antibiotics with IV vancomycin and Rocephin. - Blood cultures and wound cultures no growth so far.  Continue to check. - Adequate pain control tramadol as needed. -Orthopedics Dr. Marry Guan consulted.   - Patient underwent total knee revision with a scar debridement and patella revision with polyexchange postoperative day 2. - Infectious disease was consulted, recommended to continue current antibiotics,  awaiting blood and wound cultures.   2.  Essential hypertension  Resume Coreg.  Blood pressure is improving.  3.  Depression.    Continue Lexapro  4.  GERD.    Continue Protonix.  5.  Asthma.     Continue as needed albuterol  6.  Microcytic hypochromic anemia. Baseline hemoglobin runs between 8 and 9. Hemoglobin 7.3.  S/p Transfuse 1 PRBC .  Hemoglobin 8.5  Iron profile-pending   DVT prophylaxis: Lovenox Code Status: Full code Family Communication: No family at bedside Disposition Plan:   Status is: Inpatient  Remains inpatient appropriate because:Inpatient level of care appropriate due to severity of illness   Dispo: The patient is from: Home              Anticipated d/c is to: SNF              Anticipated d/c date is: > 3 days              Patient currently is not medically stable to d/c.  Consultants:   Orthopedics  Infectious disease  Procedures: None Antimicrobials:  Anti-infectives (From admission, onward)   Start     Dose/Rate Route Frequency Ordered Stop   01/24/20 1235  vancomycin (VANCOCIN) powder  Status:  Discontinued          As needed 01/24/20 1235 01/24/20 1434   01/24/20 1234  gentamicin (GARAMYCIN) injection  Status:  Discontinued          As needed 01/24/20 1234 01/24/20 1434   01/23/20 2200  cefTRIAXone (ROCEPHIN) 2 g in sodium chloride 0.9 % 100 mL IVPB        2 g 200 mL/hr over 30 Minutes Intravenous Every 24 hours 01/23/20 0935     01/23/20 2200  vancomycin (VANCOREADY) IVPB 750 mg/150 mL        750 mg 150  mL/hr over 60 Minutes Intravenous Every 12 hours 01/23/20 0959     01/23/20 0800  vancomycin (VANCOCIN) IVPB 1000 mg/200 mL premix  Status:  Discontinued        1,000 mg 200 mL/hr over 60 Minutes Intravenous Every 12 hours 01/23/20 0331 01/23/20 0959   01/22/20 2230  vancomycin (VANCOCIN) IVPB 1000 mg/200 mL premix  Status:  Discontinued        1,000 mg 200 mL/hr over 60 Minutes Intravenous  Once 01/22/20 2220 01/23/20 0033   01/22/20 2230  ceFAZolin (ANCEF) 2 g in dextrose 5 % 100 mL IVPB  Status:  Discontinued        2 g 240 mL/hr over 30 Minutes Intravenous Every 8 hours 01/22/20 2220 01/23/20 0935   01/22/20 2145  vancomycin  (VANCOCIN) IVPB 1000 mg/200 mL premix        1,000 mg 200 mL/hr over 60 Minutes Intravenous  Once 01/22/20 2139 01/22/20 2348   01/22/20 2145  cefTRIAXone (ROCEPHIN) 2 g in sodium chloride 0.9 % 100 mL IVPB        2 g 200 mL/hr over 30 Minutes Intravenous  Once 01/22/20 2139 01/22/20 2229      Subjective: Patient was seen and examined at bedside.  Overnight events noted.   Patient status post ORIF postoperative day 2.  She reports still having pain but better. Patient been encouraged to participate in physical therapy..  Objective: Vitals:   01/25/20 1118 01/25/20 1653 01/25/20 2157 01/25/20 2204  BP: (!) 116/52 (!) 146/66 (!) 130/56 (!) 130/56  Pulse: 79 65 73 78  Resp: 16 14  20   Temp: 97.8 F (36.6 C) 97.7 F (36.5 C)  98.3 F (36.8 C)  TempSrc: Oral   Oral  SpO2: 100% 100%  96%  Weight:      Height:        Intake/Output Summary (Last 24 hours) at 01/26/2020 1313 Last data filed at 01/26/2020 1035 Gross per 24 hour  Intake 0 ml  Output 0 ml  Net 0 ml   Filed Weights   01/23/20 0210  Weight: 88.5 kg    Examination:  General exam: Appears calm and comfortable, not in any acute distress. Respiratory system: Clear to auscultation. Respiratory effort normal. Cardiovascular system: S1 & S2 heard, RRR. No JVD, murmurs, rubs, gallops or clicks. No pedal edema. Gastrointestinal system: Abdomen is nondistended, soft and nontender. No organomegaly or masses felt. Normal bowel sounds heard. Central nervous system: Alert and oriented. No focal neurological deficits. Extremities: Right leg in dressing.  In immobilizer.   Skin: No rashes, lesions or ulcers Psychiatry: Judgement and insight appear normal. Mood & affect appropriate.     Data Reviewed: I have personally reviewed following labs and imaging studies  CBC: Recent Labs  Lab 01/22/20 1915 01/23/20 0540 01/23/20 0824 01/23/20 1958 01/24/20 0538 01/25/20 0450 01/26/20 0550  WBC 15.5* 9.9  --   --   --   11.7* 10.5  NEUTROABS 12.8*  --   --   --   --   --   --   HGB 10.0* 7.7* 7.3* 8.3* 8.8* 8.3* 8.5*  HCT 33.2* 25.1* 24.8* 26.7* 28.5* 27.8* 28.8*  MCV 70.2* 69.7*  --   --   --  72.6* 73.3*  PLT 797* 668*  --   --   --  492* 973*   Basic Metabolic Panel: Recent Labs  Lab 01/22/20 1915 01/23/20 0540 01/24/20 0538 01/25/20 0450 01/26/20 0550  NA 137 140 142  139 136  K 4.1 3.8 3.9 4.2 3.8  CL 102 108 110 106 103  CO2 22 26 25 26 26   GLUCOSE 132* 102* 94 115* 103*  BUN 13 12 9 10 11   CREATININE 0.86 0.83 0.71 0.80 0.66  CALCIUM 10.5* 9.8 9.6 9.7 10.3  MG  --   --  1.9 1.8 2.1  PHOS  --   --  3.8 3.3 2.9   GFR: Estimated Creatinine Clearance: 74.3 mL/min (by C-G formula based on SCr of 0.66 mg/dL). Liver Function Tests: Recent Labs  Lab 01/22/20 1915  AST 20  ALT 11  ALKPHOS 124  BILITOT 0.6  PROT 7.8  ALBUMIN 3.1*   No results for input(s): LIPASE, AMYLASE in the last 168 hours. No results for input(s): AMMONIA in the last 168 hours. Coagulation Profile: Recent Labs  Lab 01/22/20 1915  INR 1.2   Cardiac Enzymes: No results for input(s): CKTOTAL, CKMB, CKMBINDEX, TROPONINI in the last 168 hours. BNP (last 3 results) No results for input(s): PROBNP in the last 8760 hours. HbA1C: No results for input(s): HGBA1C in the last 72 hours. CBG: No results for input(s): GLUCAP in the last 168 hours. Lipid Profile: No results for input(s): CHOL, HDL, LDLCALC, TRIG, CHOLHDL, LDLDIRECT in the last 72 hours. Thyroid Function Tests: No results for input(s): TSH, T4TOTAL, FREET4, T3FREE, THYROIDAB in the last 72 hours. Anemia Panel: No results for input(s): VITAMINB12, FOLATE, FERRITIN, TIBC, IRON, RETICCTPCT in the last 72 hours. Sepsis Labs: Recent Labs  Lab 01/22/20 1915 01/22/20 2202  LATICACIDVEN 2.7* 2.3*    Recent Results (from the past 240 hour(s))  Aerobic Culture (superficial specimen)     Status: None   Collection Time: 01/22/20  1:50 PM   Specimen: KNEE   Result Value Ref Range Status   Specimen Description   Final    KNEE Performed at [facility], [address] [place], [place] [address]    Special Requests   Final    NONE Performed at [facility], [address] [place], [place] [address]    Gram Stain   Final    RARE WBC PRESENT,BOTH PMN AND MONONUCLEAR NO ORGANISMS SEEN    Culture   Final    RARE NORMAL SKIN FLORA Performed at [facility], [place] [address] [place], [place] [address]    Report Status 01/25/2020 FINAL  Final  Culture, blood (Routine x 2)     Status: None (Preliminary result)   Collection Time: 01/22/20  7:15 PM   Specimen: BLOOD  Result Value Ref Range Status   Specimen Description BLOOD BLOOD RIGHT HAND  Final   Special Requests   Final    BOTTLES DRAWN AEROBIC ONLY Blood Culture results may not be optimal due to an inadequate volume of blood received in culture bottles   Culture   Final    NO GROWTH 4 DAYS Performed at [facility], [address] [place],  [address]    Report Status PENDING  Incomplete  Culture, blood (Routine x 2)     Status: None (Preliminary result)   Collection Time: 01/22/20 10:02 PM   Specimen: BLOOD  Result Value Ref Range Status   Specimen Description BLOOD RIGHT ANTECUBITAL  Final   Special Requests   Final    BOTTLES DRAWN AEROBIC AND ANAEROBIC Blood Culture adequate volume   Culture   Final    NO GROWTH 4 DAYS Performed at [facility], [place],  [place], [place] [address]    Report Status PENDING  Incomplete  Resp Panel by RT-PCR (Flu A&B, Covid) Nasopharyngeal Swab     Status: None   Collection Time: 01/22/20 10:02 PM   Specimen: Nasopharyngeal Swab; Nasopharyngeal(NP) swabs in vial transport medium  Result Value Ref Range Status   SARS Coronavirus 2 by RT PCR NEGATIVE NEGATIVE Final    Comment: (NOTE) SARS-CoV-2 target nucleic acids are NOT DETECTED.  The SARS-CoV-2 RNA is generally detectable in  upper respiratory specimens during the acute phase of infection. The lowest concentration of SARS-CoV-2 viral copies this assay can detect is 138 copies/mL. A negative result does not preclude SARS-Cov-2 infection and should not be used as the sole basis for treatment or other patient management decisions. A negative result may occur with  improper specimen collection/handling, submission of specimen other than nasopharyngeal swab, presence of viral mutation(s) within the areas targeted by this assay, and inadequate number of viral copies(<138 copies/mL). A negative result must be combined with clinical observations, patient history, and epidemiological information. The expected result is Negative.  Fact Sheet for Patients:  EntrepreneurPulse.com.au  Fact Sheet for Healthcare Providers:  IncredibleEmployment.be  This test is no t yet approved or cleared by the [place] FDA and  has been authorized for detection and/or diagnosis of SARS-CoV-2 by FDA under an Emergency Use Authorization (EUA). This EUA will remain  in effect (meaning this test can be used) for the duration of the COVID-19 declaration under Section 564(b)(1) of the Act, 21 U.S.C.section 360bbb-3(b)(1), unless the authorization is terminated  or revoked sooner.       Influenza A by PCR NEGATIVE NEGATIVE Final   Influenza B by PCR NEGATIVE NEGATIVE Final    Comment: (NOTE) The Xpert Xpress SARS-CoV-2/FLU/RSV plus assay is intended as an aid in the diagnosis of influenza from Nasopharyngeal swab specimens and should not be used as a sole basis for treatment. Nasal washings and aspirates are unacceptable for Xpert Xpress SARS-CoV-2/FLU/RSV testing.  Fact Sheet for Patients: EntrepreneurPulse.com.au  Fact Sheet for Healthcare Providers: IncredibleEmployment.be  This test is not yet approved or cleared by the [place] FDA and has been  authorized for detection and/or diagnosis of SARS-CoV-2 by FDA under an Emergency Use Authorization (EUA). This EUA will remain in effect (meaning this test can be used) for the duration of the COVID-19 declaration under Section 564(b)(1) of the Act, 21 U.S.C. section 360bbb-3(b)(1), unless the authorization is terminated or revoked.  Performed at [facility], [place] [place], [place] [address]   Aerobic Culture (superficial specimen)     Status: None (Preliminary result)   Collection Time: 01/23/20  7:02 PM   Specimen: Wound  Result Value Ref Range Status   Specimen Description   Final    WOUND Performed at [facility], [address] [place], [place] [address]    Special Requests   Final    NONE Performed at [facility], [place] [place], [place] [address]    Gram Stain   Final    FEW WBC PRESENT, PREDOMINANTLY PMN NO ORGANISMS SEEN    Culture   Final    CULTURE REINCUBATED FOR BETTER GROWTH Performed at [facility], [place] [address], [place], [place] [address]    Report Status PENDING  Incomplete  Anaerobic culture     Status: None (Preliminary result)   Collection Time: 01/24/20 11:43 AM   Specimen: PATH Other; Wound  Result Value Ref Range Status  Specimen Description SYNOVIAL RIGHT KNEE  Final   Special Requests   Final    RECEIVED IN STERILE CUP Performed at [facility], [place] [address] [place], [place] [address]    Gram Stain PENDING  Incomplete   Culture   Final    NO ANAEROBES ISOLATED; CULTURE IN PROGRESS FOR 5 DAYS   Report Status PENDING  Incomplete  Gram stain     Status: None   Collection Time: 01/24/20 11:43 AM   Specimen: PATH Other; Wound  Result Value Ref Range Status   Specimen Description WOUND  Final   Special Requests NONE  Final   Gram Stain   Final    MODERATE WBC SEEN NO ORGANISMS SEEN RESULT CALLED TO, READ BACK BY AND VERIFIED WITH: Caralee Ates AT 1218 01/24/20  SDR Performed at [facility], [address], [place], [place] [address]    Report Status 01/24/2020 FINAL  Final  Aerobic/Anaerobic Culture (surgical/deep wound)     Status: None (Preliminary result)   Collection Time: 01/24/20 11:43 AM   Specimen: Synovial, Right Knee; Body Fluid  Result Value Ref Range Status   Specimen Description WOUND RIGHT KNEE  Final   Special Requests   Final    SUPERFICIAL SWAB Performed at [facility], [place] [address], [place], [place] [address]    Gram Stain PENDING  Incomplete   Culture   Final    CULTURE REINCUBATED FOR BETTER GROWTH NO ANAEROBES ISOLATED; CULTURE IN PROGRESS FOR 5 DAYS    Report Status PENDING  Incomplete  Body fluid culture     Status: None (Preliminary result)   Collection Time: 01/24/20 11:43 AM   Specimen: Synovium  Result Value Ref Range Status   Specimen Description SYNOVIAL FLUID RIGHT KNEE  Final   Special Requests RECEIVED IN A CUP  Final   Gram Stain   Final    ABUNDANT WBC PRESENT, PREDOMINANTLY PMN NO ORGANISMS SEEN    Culture   Final    CULTURE REINCUBATED FOR BETTER GROWTH Performed at [facility], [place] [address] [place], [place] [address]    Report Status PENDING  Incomplete  Gram stain     Status: None   Collection Time: 01/24/20 12:15 PM   Specimen: Wound  Result Value Ref Range Status   Specimen Description WOUND  Final   Special Requests C RIGHT KNEE  SUPRA PATELLAR POUCH   Final   Gram Stain   Final    MODERATE WBC SEEN NO ORGANISMS SEEN RESULT CALLED TO, READ BACK BY AND VERIFIED WITHCaralee Ates AT [id number] 01/24/20 SDR Performed at [facility], [address] [place], [place] [address]    Report Status 01/24/2020 FINAL  Final  Aerobic/Anaerobic Culture (surgical/deep wound)     Status: None (Preliminary result)   Collection Time: 01/24/20 12:15 PM   Specimen: Wound  Result Value Ref Range Status   Specimen Description WOUND RIGHT KNEE  Final   Special  Requests   Final    SPECIMEN C SUPRA PATELLAR POUCH Performed at [facility], [address] [place],  [address]    Gram Stain PENDING  Incomplete   Culture   Final    CULTURE REINCUBATED FOR BETTER GROWTH NO ANAEROBES ISOLATED; CULTURE IN PROGRESS FOR 5 DAYS    Report Status PENDING  Incomplete  Gram stain     Status: None   Collection Time: 01/24/20 12:20 PM   Specimen: Wound  Result  Value Ref Range Status   Specimen Description WOUND  Final   Special Requests D RIGHT  KNEE LATERAL GUTTEN  Final   Gram Stain   Final    FEW WBC SEEN NO ORGANISMS SEEN TERESA BENSON AT [id number] 01/24/20 SDR Performed at [facility], [address] [place], [place] [address]    Report Status 01/24/2020 FINAL  Final  Aerobic/Anaerobic Culture (surgical/deep wound)     Status: None (Preliminary result)   Collection Time: 01/24/20 12:20 PM   Specimen: Wound  Result Value Ref Range Status   Specimen Description   Final    WOUND Performed at [facility], [address] [place], [place] [address]    Special Requests  SPECIMEN D RIGHT KNEE LATERAL GUTTERS  Final   Gram Stain PENDING  Incomplete   Culture   Final    CULTURE REINCUBATED FOR BETTER GROWTH Performed at [facility], [place] [address] [place], [place] [address]    Report Status PENDING  Incomplete  Gram stain     Status: None   Collection Time: 01/24/20 12:22 PM   Specimen: Wound  Result Value Ref Range Status   Specimen Description WOUND  Final   Special Requests E RIGHT KNEE MEDIAL GUTTEN  Final   Gram Stain   Final    FEW WBC SEEN NO ORGANISMS SEEN RESULT CALLED TO, READ BACK BY AND VERIFIED WITHCaralee Ates AT [id number] 01/24/20 SDR Performed at [facility], [address], [place], [place] [address]    Report Status 01/24/2020 FINAL  Final  Aerobic/Anaerobic Culture (surgical/deep wound)     Status: None (Preliminary result)   Collection Time: 01/24/20 12:22 PM   Specimen:  Wound  Result Value Ref Range Status   Specimen Description   Final    WOUND Performed at [facility], [address], [place], [place] [address]    Special Requests   Final     SPECIMENE RIGHT KNEE MEDIAL GUTTEN Performed at [facility], [place] [address] [place], [place] [address]    Gram Stain PENDING  Incomplete   Culture   Final    CULTURE REINCUBATED FOR BETTER GROWTH NO ANAEROBES ISOLATED; CULTURE IN PROGRESS FOR 5 DAYS    Report Status PENDING  Incomplete  Gram stain     Status: None   Collection Time: 01/24/20 12:24 PM   Specimen: Wound  Result Value Ref Range Status   Specimen Description WOUND  Final   Special Requests F RIGHT KNEE POSTERIOR RECESS  Final   Gram Stain   Final    MANY WBC SEEN NO ORGANISMS SEEN RESULT CALLED TO, READ BACK BY AND VERIFIED WITHCaralee Ates AT [id number] 01/24/20 SDR Performed at [facility], [address] [place], [place] [address]    Report Status 01/24/2020 FINAL  Final  Aerobic/Anaerobic Culture (surgical/deep wound)     Status: None (Preliminary result)   Collection Time: 01/24/20 12:24 PM   Specimen: Wound  Result Value Ref Range Status   Specimen Description   Final    WOUND Performed at [facility], [address] [place], [place] [address]    Special Requests   Final     SPECIMENF RIGHT KNEE POSTERIOI RECESS Performed at [facility], [place] [address], [place], [place] [address]    Gram Stain PENDING  Incomplete   Culture   Final    CULTURE REINCUBATED FOR BETTER GROWTH NO ANAEROBES ISOLATED; CULTURE IN  PROGRESS FOR 5 DAYS    Report Status PENDING  Incomplete  Urine culture     Status: None   Collection Time: 01/24/20  2:49 PM   Specimen: Urine, Catheterized  Result Value Ref Range Status   Specimen Description   Final    URINE, CATHETERIZED Performed at [facility], [address] [place], [place] [address]    Special Requests   Final    NONE Performed at  [facility], [address] [place], [place] [address]    Culture   Final    NO GROWTH Performed at [facility], [place] [address] [place], [place] [address]    Report Status 01/25/2020 FINAL  Final    Radiology Studies: No results found. Scheduled Meds: . acidophilus  1 capsule Oral Daily  . vitamin C  500 mg Oral BID  . calcium-vitamin D  2 tablet Oral Daily  . carvedilol  3.125 mg Oral BID  . enoxaparin (LOVENOX) injection  30 mg Subcutaneous Q12H  . escitalopram  10 mg Oral Daily  . feeding supplement  237 mL Oral BID BM  . ferrous sulfate  325 mg Oral BID WC  . influenza vaccine adjuvanted  0.5 mL Intramuscular Tomorrow-1000  . loratadine  10 mg Oral Daily  . magnesium hydroxide  30 mL Oral Daily  . multivitamin with minerals  1 tablet Oral Daily  . multivitamin-lutein  1 capsule Oral BID  . pantoprazole  40 mg Oral BID   Continuous Infusions: . sodium chloride 100 mL/hr at 01/24/20 1815  . cefTRIAXone (ROCEPHIN)  IV 2 g (01/25/20 2214)  . tranexamic acid    . vancomycin 750 mg (01/26/20 0928)     LOS: 4 days    Time spent: 25 mins    Deshon Hsiao, MD [facility]   If 7PM-7AM, please contact night-coverage

## 2020-01-26 NOTE — Evaluation (Signed)
Occupational Therapy Evaluation Patient Details Name: Jasmine Morrow MRN: [id number] DOB: 09-21-1945 Today's Date: 01/26/2020    History of Present Illness Pt is a 74 y/o F who was admitted on 01/22/20 after presenting to ED with R ankle swelling, pain, & draining wound which has developed over the last 5 days. Pt admitted for sepsis 2/2 wound infection. 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 PMH: asthma, HTN, OSA, GERD, anxiety   Clinical Impression   Pt seen for OT evaluation this date s/p total knee revision with scar debridement/patella revision with polyexchange.  Presents with muscle weakness, decreased transfers, functional mobility and decreased ability to perform self care and IADL tasks.  Patient lives with husband and would like to go home if possible.  Based on her current abilities, recommend short term rehab but if she progresses well in the next couple days she may be appropriate for home with home health.  Pt would benefit from skilled OT services in the acute care setting to maximize safety and independence with necessary daily tasks.      Follow Up Recommendations  SNF (depends on progress, otherwise if she returns home she will need [facility].)    Equipment Recommendations       Recommendations for Other Services       Precautions / Restrictions Precautions Precautions: Fall Precaution Comments: NO ROM to R Knee Required Braces or Orthoses: Knee Immobilizer - Right Knee Immobilizer - Right: On when out of bed or walking Restrictions Weight Bearing Restrictions: Yes RLE Weight Bearing: Weight bearing as tolerated      Mobility Bed Mobility Overal bed mobility: Needs Assistance Bed Mobility: Supine to Sit;Sit to Supine     Supine to sit: Min assist;HOB elevated Sit to supine: Min assist        Transfers                 General transfer comment: Pt declined OOB activity this pm due to feeling  dizzy    Balance                                           ADL either performed or assessed with clinical judgement   ADL Overall ADL's : Needs assistance/impaired Eating/Feeding: Set up   Grooming: Set up;Bed level   Upper Body Bathing: Set up;Bed level;Minimal assistance   Lower Body Bathing: Maximal assistance   Upper Body Dressing : Minimal assistance;Set up   Lower Body Dressing: Set up;Maximal assistance     Toilet Transfer Details (indicate cue type and reason): anticipate min to mod assist per clinical judgement           General ADL Comments: Husband present during evaluation and reports he would like to be able to take her home.  Pt reports she realizes this is based on her progress and she may need to go to SNF.  Husband reports he would like to avoid SNF if possible.     Vision Baseline Vision/History: Wears glasses Wears Glasses: Reading only       Perception     Praxis      Pertinent Vitals/Pain Pain Assessment: 0-10 Pain Score: 4  Pain Location: RLE pain increases with movement. Pain Descriptors / Indicators: Discomfort;Grimacing Pain Intervention(s): Limited activity within patient's tolerance     Hand Dominance Left   Extremity/Trunk Assessment Upper Extremity Assessment  Upper Extremity Assessment: Generalized weakness   Lower Extremity Assessment Lower Extremity Assessment: Defer to PT evaluation       Communication Communication Communication: No difficulties   Cognition Arousal/Alertness: Awake/alert Behavior During Therapy: WFL for tasks assessed/performed Overall Cognitive Status: Within Functional Limits for tasks assessed                                     General Comments       Exercises     Shoulder Instructions      Home Living Family/patient expects to be discharged to:: Private residence Living Arrangements: Spouse/significant other Available Help at Discharge: Family;Available  24 hours/day Type of Home: House Home Access: Stairs to enter;Ramped entrance Entrance Stairs-Number of Steps: 1 Entrance Stairs-Rails: None Home Layout: One level     Bathroom Shower/Tub: Walk-in shower;Door   Bathroom Toilet: Handicapped height     Home Equipment: Environmental consultant - 4 wheels;Bedside commode;Wheelchair - manual;Hospital bed   Additional Comments: Pt reports she was working with [facility] prior to admission, had OT in the past.  Pt reports she either uses a depends or transfers to [facility] to void bowel/bladder. Pt has w/c at home.      Prior Functioning/Environment Level of Independence: Needs assistance  Gait / Transfers Assistance Needed: Was getting HHPT prior to admission and working on transfer and mobility.  Issues since last Jan after ankle fx and now knee revision. ADL's / Homemaking Assistance Needed: PTA she required some assist with lower body dressing and self care tasks from husband.  Husband does the housework, cooking and cleaning.            OT Problem List: Decreased strength;Decreased knowledge of use of DME or AE;Decreased range of motion;Decreased activity tolerance;Impaired balance (sitting and/or standing);Pain      OT Treatment/Interventions: Self-care/ADL training;Therapeutic exercise;Patient/family education;Balance training;Therapeutic activities;DME and/or AE instruction    OT Goals(Current goals can be found in the care plan section) Acute Rehab OT Goals Patient Stated Goal: to be as independent as possible OT Goal Formulation: With patient/family Time For Goal Achievement: 02/08/20 Potential to Achieve Goals: Good ADL Goals Pt Will Perform Lower Body Dressing: (P) with min assist Pt Will Transfer to Toilet: (P) with min guard assist  OT Frequency: Min 1X/week   Barriers to D/C:            Co-evaluation              AM-PAC OT "6 Clicks" Daily Activity     Outcome Measure Help from another person eating meals?: None Help from another  person taking care of personal grooming?: A Little Help from another person toileting, which includes using toliet, bedpan, or urinal?: A Lot Help from another person bathing (including washing, rinsing, drying)?: A Lot Help from another person to put on and taking off regular upper body clothing?: A Little Help from another person to put on and taking off regular lower body clothing?: A Lot 6 Click Score: 16   End of Session Equipment Utilized During Treatment: Gait belt  Activity Tolerance: Other (comment) (dizziness) Patient left: in bed;with call bell/phone within reach;with bed alarm set;with family/visitor present  OT Visit Diagnosis: Unsteadiness on feet (R26.81);Muscle weakness (generalized) (M62.81)                Time: 1421-1440 OT Time Calculation (min): 19 min Charges:  OT General Charges $OT Visit: 1 Visit OT Evaluation $  OT Eval Low Complexity: 1 Low  Lucinda Spells T Tiquan Bouch, OTR/L, CLT   Kacia Halley 01/26/2020, 2:58 PM

## 2020-01-26 NOTE — Progress Notes (Addendum)
  Subjective: 2 Days Post-Op Procedure(s) (LRB): TOTAL KNEE REVISION WITH SCAR DEBRIDEMENT/PATELLA REVISION WITH POLY EXCHANGE (Right) Patient reports pain as moderate.   Patient is well, but has had some minor complaints of nausea earlier.  Plan is to go to SNF after discharge. Negative for chest pain and shortness of breath Fever: no Gastrointestinal: negative for vomiting.   Patient has not had a bowel movement.  Objective: Vital signs in last 24 hours: Temp:  [97.7 F (36.5 C)-98.3 F (36.8 C)] 98.3 F (36.8 C) (12/25 2204) Pulse Rate:  [65-79] 78 (12/25 2204) Resp:  [14-20] 20 (12/25 2204) BP: (116-146)/(52-66) 130/56 (12/25 2204) SpO2:  [96 %-100 %] 96 % (12/25 2204)  Intake/Output from previous day:  Intake/Output Summary (Last 24 hours) at 01/26/2020 0849 Last data filed at 01/25/2020 2015 Gross per 24 hour  Intake 240 ml  Output 0 ml  Net 240 ml    Intake/Output this shift: No intake/output data recorded.  Labs: Recent Labs    01/23/20 1958 01/24/20 0538 01/25/20 0450 01/26/20 0550  HGB 8.3* 8.8* 8.3* 8.5*   Recent Labs    01/25/20 0450 01/26/20 0550  WBC 11.7* 10.5  RBC 3.83* 3.93  HCT 27.8* 28.8*  PLT 492* 485*   Recent Labs    01/25/20 0450 01/26/20 0550  NA 139 136  K 4.2 3.8  CL 106 103  CO2 26 26  BUN 10 11  CREATININE 0.80 0.66  GLUCOSE 115* 103*  CALCIUM 9.7 10.3   No results for input(s): LABPT, INR in the last 72 hours.   EXAM General - Patient is Alert, Appropriate and Oriented Extremity - Neurovascular intact Dorsiflexion/Plantar flexion intact Compartment soft Post-op dressing and prevena in place.  Dressing/Incision -Postoperative dressing remains in place., Polar Care in place and working. 100 mL of serosanguinous drainage noted in canister  Motor Function - intact, moving foot and toes well on exam.  Cardiovascular- Regular rate and rhythm, no murmurs/rubs/gallops Respiratory- Lungs clear to auscultation  bilaterally Gastrointestinal- soft, nontender and active bowel sounds   Left knee: increased warmth, no erythema. No pain with gentle passive ROM. N/V intact to LLE.    Assessment/Plan: 2 Days Post-Op Procedure(s) (LRB): TOTAL KNEE REVISION WITH SCAR DEBRIDEMENT/PATELLA REVISION WITH POLY EXCHANGE (Right) Active Problems:   Abscess of right knee  Estimated body mass index is 28.81 kg/m as calculated from the following:   Height as of this encounter: 5\' 9"  (1.753 m).   Weight as of this encounter: 88.5 kg. Advance diet Up with therapy-no ROM of R knee  Discussed the importance of therapy with the patient, and that Dr. Marry Guan wants her to participate. Explained that she could walk with the knee immobilizer while using the walker.   Also discussed the use of oxycodone or tramadol for pain control as opposed to dilaudid. Patient states that she has bad reactions to oxycodone and that the last time she took it she "talked out of her head for a few days." She is open to trying tramadol instead. Explained she could take up to 100 mg at a time if necessary.   Knee immobilizer when ambulating.  Post-op dressing removed. Hemovac removed. Mini compression dressing applied. TED hose applied to non-operative leg.   DVT Prophylaxis - Lovenox, Ted hose and foot pumps Weight-Bearing as tolerated to right leg  Cassell Smiles, PA-C [facility] 01/26/2020, 8:49 AM

## 2020-01-27 ENCOUNTER — Encounter: Payer: Self-pay | Admitting: Orthopedic Surgery

## 2020-01-27 DIAGNOSIS — T8459XA Infection and inflammatory reaction due to other internal joint prosthesis, initial encounter: Secondary | ICD-10-CM

## 2020-01-27 DIAGNOSIS — L02415 Cutaneous abscess of right lower limb: Secondary | ICD-10-CM | POA: Diagnosis not present

## 2020-01-27 DIAGNOSIS — Z96659 Presence of unspecified artificial knee joint: Secondary | ICD-10-CM

## 2020-01-27 LAB — BPAM RBC
Blood Product Expiration Date: 202112302359
Blood Product Expiration Date: 202201012359
Blood Product Expiration Date: 202201022359
ISSUE DATE / TIME: 202112231421
Unit Type and Rh: 5100
Unit Type and Rh: 5100
Unit Type and Rh: 5100

## 2020-01-27 LAB — TYPE AND SCREEN
ABO/RH(D): O POS
Antibody Screen: POSITIVE
Donor AG Type: NEGATIVE
Unit division: 0
Unit division: 0
Unit division: 0

## 2020-01-27 LAB — CULTURE, BLOOD (ROUTINE X 2)
Culture: NO GROWTH
Culture: NO GROWTH
Special Requests: ADEQUATE

## 2020-01-27 LAB — IRON AND TIBC
Iron: 20 ug/dL — ABNORMAL LOW (ref 28–170)
Saturation Ratios: 9 % — ABNORMAL LOW (ref 10.4–31.8)
TIBC: 224 ug/dL — ABNORMAL LOW (ref 250–450)
UIBC: 204 ug/dL

## 2020-01-27 LAB — FERRITIN: Ferritin: 56 ng/mL (ref 11–307)

## 2020-01-27 LAB — HEMOGLOBIN AND HEMATOCRIT, BLOOD
HCT: 27.9 % — ABNORMAL LOW (ref 36.0–46.0)
Hemoglobin: 8.2 g/dL — ABNORMAL LOW (ref 12.0–15.0)

## 2020-01-27 NOTE — Progress Notes (Signed)
Physical Therapy Treatment Patient Details Name: Jasmine Morrow MRN: [id number] DOB: 10/13/1945 Today's Date: 01/27/2020    History of Present Illness Pt is a 74 y/o F who was admitted on 01/22/20 after presenting to ED with R ankle swelling, pain, & draining wound which has developed over the last 5 days. Pt admitted for sepsis 2/2 wound infection. 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 PMH: asthma, HTN, OSA, GERD, anxiety    PT Comments    Pt seen for PT treatment with session focusing heavily on education. Pt is able to complete supine>sit with supervision but still requires min assist for RLE for sit>supine. PT & pt spent significant time problem solving transfers & transport home. Pt currently requires MOD assist for sit>stand from significantly elevated bed & tolerates standing <10 seconds. Pt notes she can borrow a w/c accessible van for transport home but PT educates pt on significantly increased difficulty with sit>stand from low w/c surface and voices concern re: pt's ability to safely transfer w/c>hospital bed at home. PT recommends & pt is agreeable to non emergent EMS transport home at this time. Pt also reports she discussed with her husband & would prefer to d/c home vs STR. PT educates pt on how & when to don/doff R knee immobilizer but question pt's ability to teach back & would benefit from further education. PT & pt discussed goal of taking side steps at EOB tomorrow if possible. Will continue to follow pt acutely to progress strengthening, transfers, & gait as able.     Follow Up Recommendations  Home health PT;Supervision for mobility/OOB;Supervision/Assistance - 24 hour     Equipment Recommendations   (elevating leg rests for wheelchair), non emergent EMS transport home   Recommendations for Other Services       Precautions / Restrictions Precautions Precautions: Fall Precaution Comments: NO ROM to R  Knee Required Braces or Orthoses: Knee Immobilizer - Right Knee Immobilizer - Right: On when out of bed or walking Restrictions Weight Bearing Restrictions: Yes RLE Weight Bearing: Weight bearing as tolerated    Mobility  Bed Mobility Overal bed mobility: Needs Assistance Bed Mobility: Supine to Sit;Sit to Supine     Supine to sit: Supervision;HOB elevated Sit to supine: Min assist;HOB elevated   General bed mobility comments: assistance to elevate RLE onto bed  Transfers Overall transfer level: Needs assistance Equipment used: Rolling walker (2 wheeled) Transfers: Sit to/from Stand Sit to Stand: Mod assist;From elevated surface         General transfer comment: cuing for hand placement, pt unable to sufficiently place LLE underneath BOS, pt pushes to standing & during transfer shuffles her BLE feet underneath her  Ambulation/Gait                 Stairs             Wheelchair Mobility    Modified Rankin (Stroke Patients Only)       Balance Overall balance assessment: Needs assistance Sitting-balance support: No upper extremity supported;Feet supported Sitting balance-Leahy Scale: Good Sitting balance - Comments: supervision static sitting EOB   Standing balance support: Bilateral upper extremity supported Standing balance-Leahy Scale: Poor Standing balance comment: BUE support on RW for static standing                            Cognition Arousal/Alertness: Awake/alert Behavior During Therapy: WFL for tasks assessed/performed Overall Cognitive Status: Within Functional  Limits for tasks assessed                                 General Comments: pt anxious with mobility      Exercises      General Comments General comments (skin integrity, edema, etc.): PT dons/doffs R knee immobilizer at beginning & end of session      Pertinent Vitals/Pain Pain Assessment: Faces Faces Pain Scale: Hurts a little bit Pain  Location: RLE Pain Descriptors / Indicators: Discomfort Pain Intervention(s): Limited activity within patient's tolerance;Monitored during session    Home Living                      Prior Function            PT Goals (current goals can now be found in the care plan section) Acute Rehab PT Goals Patient Stated Goal: to be as independent as possible PT Goal Formulation: With patient/family Time For Goal Achievement: 02/09/20 Potential to Achieve Goals: Fair Progress towards PT goals: Progressing toward goals    Frequency    BID      PT Plan Current plan remains appropriate    Co-evaluation              AM-PAC PT "6 Clicks" Mobility   Outcome Measure  Help needed turning from your back to your side while in a flat bed without using bedrails?: A Little Help needed moving from lying on your back to sitting on the side of a flat bed without using bedrails?: A Little Help needed moving to and from a bed to a chair (including a wheelchair)?: A Lot Help needed standing up from a chair using your arms (e.g., wheelchair or bedside chair)?: A Lot Help needed to walk in hospital room?: A Lot Help needed climbing 3-5 steps with a railing? : Total 6 Click Score: 13    End of Session Equipment Utilized During Treatment: Right knee immobilizer;Gait belt Activity Tolerance: Patient tolerated treatment well;Patient limited by fatigue;Patient limited by pain Patient left: in bed;with call bell/phone within reach;with bed alarm set (BLE foot pumps & RLE polar care donned)   PT Visit Diagnosis: Muscle weakness (generalized) (M62.81);Difficulty in walking, not elsewhere classified (R26.2);Pain Pain - Right/Left: Right Pain - part of body: Leg     Time: 2330-0762 PT Time Calculation (min) (ACUTE ONLY): 42 min  Charges:  $Therapeutic Activity: 38-52 mins                     Lavone Nian, PT, DPT 01/27/20, 4:36 PM    [name] 01/27/2020, 4:32 PM

## 2020-01-27 NOTE — Progress Notes (Signed)
Physical Therapy Treatment Patient Details Name: Jasmine Morrow MRN: [id number] DOB: Jun 03, 1945 Today's Date: 01/27/2020    History of Present Illness Pt is a 74 y/o F who was admitted on 01/22/20 after presenting to ED with R ankle swelling, pain, & draining wound which has developed over the last 5 days. Pt admitted for sepsis 2/2 wound infection. 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 PMH: asthma, HTN, OSA, GERD, anxiety    PT Comments    Pt seen for PT tx on this date, with pt reporting less pain compared to yesterday. PT dons/doffs R knee immobilizer total assist at beginning & end of session. Pt performs RLE exercises as noted below with good activation noted in quads. Pt performs supine>sit with less assistance on this date and progresses to sit<>stand with mod assist with pt able to maintain standing ~10 seconds before requesting to sit. PT educated pt on potential need to d/c to SNF but gives impression & pt voices she'd prefer to go home upon d/c from acute care setting but will discuss options with husband. PT educates pt on need to progress to transfers to chair to allow pt to safely d/c home but pt states she can also get non emergent EMS transport home if necessary. Will continue to follow pt acutely to progress transfers & gait as able.   During session, wound vac tubing disconnected - nurse notified & fixed tubing during session.     Follow Up Recommendations  Home health PT;Supervision for mobility/OOB;Supervision/Assistance - 24 hour     Equipment Recommendations  None recommended by PT    Recommendations for Other Services       Precautions / Restrictions Precautions Precautions: Fall Precaution Comments: NO ROM to R Knee Required Braces or Orthoses: Knee Immobilizer - Right Knee Immobilizer - Right: On when out of bed or walking Restrictions Weight Bearing Restrictions: Yes RLE Weight Bearing: Weight bearing  as tolerated    Mobility  Bed Mobility Overal bed mobility: Needs Assistance Bed Mobility: Supine to Sit;Sit to Supine     Supine to sit: Min guard;HOB elevated Sit to supine: Min assist;HOB elevated (to support RLE)   General bed mobility comments: extra time  Transfers Overall transfer level: Needs assistance   Transfers: Sit to/from Stand Sit to Stand: Mod assist         General transfer comment: cuing for safe hand placement on RW/bed, extra time to complete transitional movement, cuing for LLE placement but pt with limited ability to place LLE underneath BOS, significantly elevated EOB 2/2 R knee immobilizer/need to maintain extension  Ambulation/Gait                 Stairs             Wheelchair Mobility    Modified Rankin (Stroke Patients Only)       Balance Overall balance assessment: Needs assistance Sitting-balance support: Feet supported;Bilateral upper extremity supported Sitting balance-Leahy Scale: Fair Sitting balance - Comments: supervision static sitting EOB   Standing balance support: Bilateral upper extremity supported Standing balance-Leahy Scale: Poor Standing balance comment: BUE support on RW for static standing                            Cognition Arousal/Alertness: Awake/alert Behavior During Therapy: WFL for tasks assessed/performed Overall Cognitive Status: Within Functional Limits for tasks assessed  Exercises General Exercises - Lower Extremity Hip ABduction/ADduction: AAROM;10 reps;Right;Supine;Strengthening (hip abduction, with R knee immobilizer donned) Hip Flexion/Marching: AAROM;10 reps;Right;Supine;Strengthening    General Comments General comments (skin integrity, edema, etc.): pt reports some wooziness with supine>sit & sit<>stand but it dissipates      Pertinent Vitals/Pain Pain Assessment: 0-10 Pain Score:  (5/10 RLE, 3/10 LLE) Pain  Location: BLE Pain Descriptors / Indicators: Discomfort Pain Intervention(s): Limited activity within patient's tolerance;Monitored during session;Repositioned    Home Living                      Prior Function            PT Goals (current goals can now be found in the care plan section) Acute Rehab PT Goals Patient Stated Goal: to be as independent as possible PT Goal Formulation: With patient/family Time For Goal Achievement: 02/09/20 Potential to Achieve Goals: Fair Progress towards PT goals: Progressing toward goals    Frequency    BID      PT Plan Current plan remains appropriate    Co-evaluation              AM-PAC PT "6 Clicks" Mobility   Outcome Measure  Help needed turning from your back to your side while in a flat bed without using bedrails?: A Little Help needed moving from lying on your back to sitting on the side of a flat bed without using bedrails?: A Little Help needed moving to and from a bed to a chair (including a wheelchair)?: A Lot Help needed standing up from a chair using your arms (e.g., wheelchair or bedside chair)?: A Lot Help needed to walk in hospital room?: A Lot Help needed climbing 3-5 steps with a railing? : Total 6 Click Score: 13    End of Session Equipment Utilized During Treatment: Right knee immobilizer;Gait belt Activity Tolerance: Patient tolerated treatment well;Patient limited by fatigue;Patient limited by pain Patient left: in bed;with call bell/phone within reach;with bed alarm set (BLE foot pumps & RLE polar care donned)   PT Visit Diagnosis: Muscle weakness (generalized) (M62.81);Difficulty in walking, not elsewhere classified (R26.2);Pain Pain - Right/Left: Right Pain - part of body: Leg     Time: [id number] PT Time Calculation (min) (ACUTE ONLY): 32 min  Charges:  $Therapeutic Activity: 23-37 mins                     Lavone Nian, PT, DPT 01/27/20, 10:39 AM    [name] 01/27/2020,  10:37 AM

## 2020-01-27 NOTE — Progress Notes (Signed)
Pharmacy Antibiotic Note  Jasmine Morrow is a 74 y.o. female admitted on 01/22/2020 with cellulitis. The patient has had a complication from right ankle surgery and hardware infection and underwent several debridement surgeries and January, February and October. 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 Pharmacy has been consulted for vancomycin dosing. Her renal function is stable at what appears to be her baseline level.  Plan:  vancomycin dose to 750 mg IV every 12 hours  Ke: 0.057 h-1  T1/2: 12.2 h  Css (calculated): 23.8 / 12.8 mcg/mL  Levels as clinically indicated  Daily renal function assessment while on IV vancomycin  12/27- Wound cx: Staph Aureus-f/u- sensitivites pending. Anticipate checking Vancomycin level (if continued Vancomycin is indicated by culture result)     Height: 5\' 9"  (175.3 cm) Weight: 88.5 kg (195 lb 1.7 oz) IBW/kg (Calculated) : 66.2  Temp (24hrs), Avg:98.4 F (36.9 C), Min:98.2 F (36.8 C), Max:98.6 F (37 C)  Recent Labs  Lab 01/22/20 1915 01/22/20 2202 01/23/20 0540 01/24/20 0538 01/25/20 0450 01/26/20 0550  WBC 15.5*  --  9.9  --  11.7* 10.5  CREATININE 0.86  --  0.83 0.71 0.80 0.66  LATICACIDVEN 2.7* 2.3*  --   --   --   --     Estimated Creatinine Clearance: 74.3 mL/min (by C-G formula based on SCr of 0.66 mg/dL).    Allergies  Allergen Reactions  . Celecoxib Nausea Only  . Hydrocodone-Acetaminophen Nausea And Vomiting  . Meloxicam Nausea Only  . Morphine Sulfate Er Beads Itching  . Naproxen Swelling  . Oxycodone Other (See Comments)    disorientation  . Rofecoxib Nausea Only  . Sulfa Antibiotics Other (See Comments)    Reaction: unknown  . Ciprofloxacin Rash  . Nickel Rash  . Tramadol-Acetaminophen Nausea Only and Rash    Per pt she takes this when PT comes only makes her feel "Loopy" 11/08/2019    Antimicrobials this admission: ceftriaxone 12/22  >> 12/27 vancomycin 12/22  >>   Microbiology results: 12/22  BCx: NG 5 days 12/22 SARS CoV-2: negative 12/22 influenza A/B: negative 12/24 Right knee wound:  Rare Staph aureus  Thank you for allowing pharmacy to be a part of this patient's care.  Kiara Keep A 01/27/2020 12:22 PM

## 2020-01-27 NOTE — Progress Notes (Signed)
  Subjective: 3 Days Post-Op Procedure(s) (LRB): TOTAL KNEE REVISION WITH SCAR DEBRIDEMENT/PATELLA REVISION WITH POLY EXCHANGE (Right)  Patient working with physical therapy as I entered the room. Patient reports pain as well-controlled.   Patient is well, and has had no acute complaints or problems Negative for chest pain and shortness of breath Fever: no Gastrointestinal: negative for nausea and vomiting.   Patient has had a bowel movement.  Objective: Vital signs in last 24 hours: Temp:  [98.2 F (36.8 C)-99 F (37.2 C)] 99 F (37.2 C) (12/27 1615) Pulse Rate:  [72-86] 85 (12/27 1615) Resp:  [16-20] 16 (12/27 1615) BP: (116-134)/(50-66) 134/58 (12/27 1615) SpO2:  [97 %-100 %] 100 % (12/27 1615)  Intake/Output from previous day:  Intake/Output Summary (Last 24 hours) at 01/27/2020 1738 Last data filed at 01/27/2020 1300 Gross per 24 hour  Intake 2330 ml  Output --  Net 2330 ml    Intake/Output this shift: Total I/O In: 360 [P.O.:360] Out: -   Labs: Recent Labs    01/25/20 0450 01/26/20 0550 01/27/20 0524  HGB 8.3* 8.5* 8.2*   Recent Labs    01/25/20 0450 01/26/20 0550 01/27/20 0524  WBC 11.7* 10.5  --   RBC 3.83* 3.93  --   HCT 27.8* 28.8* 27.9*  PLT 492* 485*  --    Recent Labs    01/25/20 0450 01/26/20 0550  NA 139 136  K 4.2 3.8  CL 106 103  CO2 26 26  BUN 10 11  CREATININE 0.80 0.66  GLUCOSE 115* 103*  CALCIUM 9.7 10.3   No results for input(s): LABPT, INR in the last 72 hours.   EXAM General - Patient is Alert, Appropriate and Oriented Extremity - Neurovascular intact Dorsiflexion/Plantar flexion intact Compartment soft; Patient seated at bedside with knee immobilizer in place  Dressing/Incision -Prevena wound vacs in place over the ankle and knee, working properly, approximately 125 mL of serosanguinous fluid noted in canister.  Motor Function - intact, moving foot and toes well on exam.     Assessment/Plan: 3 Days Post-Op  Procedure(s) (LRB): TOTAL KNEE REVISION WITH SCAR DEBRIDEMENT/PATELLA REVISION WITH POLY EXCHANGE (Right) Active Problems:   Abscess of right knee  Estimated body mass index is 28.81 kg/m as calculated from the following:   Height as of this encounter: 5\' 9"  (1.753 m).   Weight as of this encounter: 88.5 kg. Advance diet Up with therapy    DVT Prophylaxis - Lovenox, Ted hose and foot pumps Weight-Bearing as tolerated to right leg  Cassell Smiles, PA-C [facility] 01/27/2020, 5:38 PM

## 2020-01-27 NOTE — Care Management Important Message (Signed)
Important Message  Patient Details  Name: Jasmine Morrow MRN: [id number] Date of Birth: 1945/11/13   Medicare Important Message Given:  Yes     Johnell Comings 01/27/2020, 12:16 PM

## 2020-01-27 NOTE — Progress Notes (Signed)
PROGRESS NOTE    Jasmine Morrow  [id number] DOB: [birth year] DOA: 01/22/2020 PCP: Rusty Aus, MD   Brief Narrative:  This 74 years old female with PMH of asthma, hypertension, obstructive sleep apnea, GERD, anxiety presents to the emergency department with acute onset of right ankle swelling and pain with draining wound which has developed over the last 5 days.  Patient was seen in the wound care clinic and was sent to the emergency department after she was found to have significant drainage from her wound.  Patient reports that she has complication from right ankle surgery and hardware infection and underwent several debridement surgeries in this year. Patient is admitted for sepsis secondary to wound infection.  Patient was started on ceftriaxone and vancomycin,  infectious disease and orthopedics consulted. She is found to have anemia , has received 1 unit PRBC. Hb 8.8. 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 Postoperative day 3.  Assessment & Plan:   Active Problems:   Abscess of right knee   Sepsis secondary to right knee abscess with right knee effusion: - Patient presented with tachycardia, tachypnea, hypotension, lactic acid 2.7, obvious source of infection. -Admitted to medical monitored bed. -Continue empiric antibiotics with IV vancomycin and Rocephin. - Blood cultures: no growth so far.  Wound culture growing staph aureus,  sensitivity pending. - Adequate pain control tramadol as needed. -Orthopedics Dr. Marry Guan consulted.   - Patient underwent total knee revision with a scar debridement and patella revision with polyexchange postoperative day 3. - Infectious disease was consulted, recommended to continue current antibiotics,  awaiting blood and wound cultures.   2.  Essential hypertension  Resume Coreg.  Blood pressure is improving.  3.  Depression.    Continue Lexapro  4.  GERD.    Continue  Protonix.  5.  Asthma.    Continue as needed albuterol  6.  Microcytic hypochromic anemia. Baseline hemoglobin runs between 8 and 9. Hemoglobin 7.3.  S/p Transfuse 1 PRBC .  Hemoglobin 8.5  Iron profile-pending   DVT prophylaxis: Lovenox Code Status: Full code Family Communication: No family at bedside Disposition Plan:   Status is: Inpatient  Remains inpatient appropriate because:Inpatient level of care appropriate due to severity of illness   Dispo: The patient is from: Home              Anticipated d/c is to: SNF              Anticipated d/c date is: > 3 days              Patient currently is not medically stable to d/c.  Consultants:   Orthopedics  Infectious disease  Procedures: None Antimicrobials:  Anti-infectives (From admission, onward)   Start     Dose/Rate Route Frequency Ordered Stop   01/24/20 1235  vancomycin (VANCOCIN) powder  Status:  Discontinued          As needed 01/24/20 1235 01/24/20 1434   01/24/20 1234  gentamicin (GARAMYCIN) injection  Status:  Discontinued          As needed 01/24/20 1234 01/24/20 1434   01/23/20 2200  cefTRIAXone (ROCEPHIN) 2 g in sodium chloride 0.9 % 100 mL IVPB  Status:  Discontinued        2 g 200 mL/hr over 30 Minutes Intravenous Every 24 hours 01/23/20 0935 01/27/20 1113   01/23/20 2200  vancomycin (VANCOREADY) IVPB 750 mg/150 mL  750 mg 150 mL/hr over 60 Minutes Intravenous Every 12 hours 01/23/20 0959     01/23/20 0800  vancomycin (VANCOCIN) IVPB 1000 mg/200 mL premix  Status:  Discontinued        1,000 mg 200 mL/hr over 60 Minutes Intravenous Every 12 hours 01/23/20 0331 01/23/20 0959   01/22/20 2230  vancomycin (VANCOCIN) IVPB 1000 mg/200 mL premix  Status:  Discontinued        1,000 mg 200 mL/hr over 60 Minutes Intravenous  Once 01/22/20 2220 01/23/20 0033   01/22/20 2230  ceFAZolin (ANCEF) 2 g in dextrose 5 % 100 mL IVPB  Status:  Discontinued        2 g 240 mL/hr over 30 Minutes Intravenous Every 8  hours 01/22/20 2220 01/23/20 0935   01/22/20 2145  vancomycin (VANCOCIN) IVPB 1000 mg/200 mL premix        1,000 mg 200 mL/hr over 60 Minutes Intravenous  Once 01/22/20 2139 01/22/20 2348   01/22/20 2145  cefTRIAXone (ROCEPHIN) 2 g in sodium chloride 0.9 % 100 mL IVPB        2 g 200 mL/hr over 30 Minutes Intravenous  Once 01/22/20 2139 01/22/20 2229      Subjective: Patient was seen and examined at bedside.  Overnight events noted.   Patient status post ORIF postoperative day 3.  She reports still having pain but better. Patient has participated in physical therapy , still reports having significant pain.  Objective: Vitals:   01/26/20 2052 01/26/20 2339 01/27/20 0516 01/27/20 1148  BP: (!) 129/50 (!) 122/56 116/66 116/64  Pulse: 72 72 86 82  Resp: 17 20 17 16   Temp: 98.2 F (36.8 C) 98.6 F (37 C) 98.4 F (36.9 C)   TempSrc: Oral Oral Oral   SpO2: 97% 100% 98% 97%  Weight:      Height:        Intake/Output Summary (Last 24 hours) at 01/27/2020 1412 Last data filed at 01/27/2020 0900 Gross per 24 hour  Intake 2210 ml  Output --  Net 2210 ml   Filed Weights   01/23/20 0210  Weight: 88.5 kg    Examination:  General exam: Appears calm and comfortable, not in any acute distress. Respiratory system: Clear to auscultation. Respiratory effort normal. Cardiovascular system: S1 & S2 heard, RRR. No JVD, murmurs, rubs, gallops or clicks. No pedal edema. Gastrointestinal system: Abdomen is nondistended, soft and nontender. No organomegaly or masses felt. Normal bowel sounds heard. Central nervous system: Alert and oriented. No focal neurological deficits. Extremities: Right leg in dressing.  In immobilizer.   Skin: No rashes, lesions or ulcers Psychiatry: Judgement and insight appear normal. Mood & affect appropriate.     Data Reviewed: I have personally reviewed following labs and imaging studies  CBC: Recent Labs  Lab 01/22/20 1915 01/23/20 0540 01/23/20 0824  01/23/20 1958 01/24/20 0538 01/25/20 0450 01/26/20 0550 01/27/20 0524  WBC 15.5* 9.9  --   --   --  11.7* 10.5  --   NEUTROABS 12.8*  --   --   --   --   --   --   --   HGB 10.0* 7.7*   < > 8.3* 8.8* 8.3* 8.5* 8.2*  HCT 33.2* 25.1*   < > 26.7* 28.5* 27.8* 28.8* 27.9*  MCV 70.2* 69.7*  --   --   --  72.6* 73.3*  --   PLT 797* 668*  --   --   --  492* 485*  --    < > =  values in this interval not displayed.   Basic Metabolic Panel: Recent Labs  Lab 01/22/20 1915 01/23/20 0540 01/24/20 0538 01/25/20 0450 01/26/20 0550  NA 137 140 142 139 136  K 4.1 3.8 3.9 4.2 3.8  CL 102 108 110 106 103  CO2 22 26 25 26 26   GLUCOSE 132* 102* 94 115* 103*  BUN 13 12 9 10 11   CREATININE 0.86 0.83 0.71 0.80 0.66  CALCIUM 10.5* 9.8 9.6 9.7 10.3  MG  --   --  1.9 1.8 2.1  PHOS  --   --  3.8 3.3 2.9   GFR: Estimated Creatinine Clearance: 74.3 mL/min (by C-G formula based on SCr of 0.66 mg/dL). Liver Function Tests: Recent Labs  Lab 01/22/20 1915  AST 20  ALT 11  ALKPHOS 124  BILITOT 0.6  PROT 7.8  ALBUMIN 3.1*   No results for input(s): LIPASE, AMYLASE in the last 168 hours. No results for input(s): AMMONIA in the last 168 hours. Coagulation Profile: Recent Labs  Lab 01/22/20 1915  INR 1.2   Cardiac Enzymes: No results for input(s): CKTOTAL, CKMB, CKMBINDEX, TROPONINI in the last 168 hours. BNP (last 3 results) No results for input(s): PROBNP in the last 8760 hours. HbA1C: No results for input(s): HGBA1C in the last 72 hours. CBG: No results for input(s): GLUCAP in the last 168 hours. Lipid Profile: No results for input(s): CHOL, HDL, LDLCALC, TRIG, CHOLHDL, LDLDIRECT in the last 72 hours. Thyroid Function Tests: No results for input(s): TSH, T4TOTAL, FREET4, T3FREE, THYROIDAB in the last 72 hours. Anemia Panel: Recent Labs    01/27/20 0524  FERRITIN 56  TIBC 224*  IRON 20*   Sepsis Labs: Recent Labs  Lab 01/22/20 1915 01/22/20 2202  LATICACIDVEN 2.7* 2.3*     Recent Results (from the past 240 hour(s))  Aerobic Culture (superficial specimen)     Status: None   Collection Time: 01/22/20  1:50 PM   Specimen: KNEE  Result Value Ref Range Status   Specimen Description   Final    KNEE Performed at [facility], [address], [place], [address] [place]    Special Requests   Final    NONE Performed at [facility], [address] [place], [address] [place]    Gram Stain   Final    RARE WBC PRESENT,BOTH PMN AND MONONUCLEAR NO ORGANISMS SEEN    Culture   Final    RARE NORMAL SKIN FLORA Performed at [facility], [address] [place], [address] [place]    Report Status 01/25/2020 FINAL  Final  Culture, blood (Routine x 2)     Status: None   Collection Time: 01/22/20  7:15 PM   Specimen: BLOOD  Result Value Ref Range Status   Specimen Description BLOOD BLOOD RIGHT HAND  Final   Special Requests   Final    BOTTLES DRAWN AEROBIC ONLY Blood Culture results may not be optimal due to an inadequate volume of blood received in culture bottles   Culture   Final    NO GROWTH 5 DAYS Performed at [facility], [address], [place], [address] [place]    Report Status 01/27/2020 FINAL  Final  Culture, blood (Routine x 2)     Status: None   Collection Time: 01/22/20 10:02 PM   Specimen: BLOOD  Result Value Ref Range Status   Specimen Description BLOOD RIGHT ANTECUBITAL  Final   Special Requests   Final    BOTTLES  DRAWN AEROBIC AND ANAEROBIC Blood Culture adequate volume   Culture   Final    NO GROWTH 5 DAYS Performed at [facility], [address] [place], [place] [address]    Report Status 01/27/2020 FINAL  Final  Resp Panel by RT-PCR (Flu A&B, Covid) Nasopharyngeal Swab     Status: None   Collection Time: 01/22/20 10:02 PM   Specimen: Nasopharyngeal Swab; Nasopharyngeal(NP) swabs in vial transport medium  Result Value Ref Range Status   SARS Coronavirus 2 by RT PCR NEGATIVE NEGATIVE  Final    Comment: (NOTE) SARS-CoV-2 target nucleic acids are NOT DETECTED.  The SARS-CoV-2 RNA is generally detectable in upper respiratory specimens during the acute phase of infection. The lowest concentration of SARS-CoV-2 viral copies this assay can detect is 138 copies/mL. A negative result does not preclude SARS-Cov-2 infection and should not be used as the sole basis for treatment or other patient management decisions. A negative result may occur with  improper specimen collection/handling, submission of specimen other than nasopharyngeal swab, presence of viral mutation(s) within the areas targeted by this assay, and inadequate number of viral copies(<138 copies/mL). A negative result must be combined with clinical observations, patient history, and epidemiological information. The expected result is Negative.  Fact Sheet for Patients:  EntrepreneurPulse.com.au  Fact Sheet for Healthcare Providers:  IncredibleEmployment.be  This test is no t yet approved or cleared by the [place] FDA and  has been authorized for detection and/or diagnosis of SARS-CoV-2 by FDA under an Emergency Use Authorization (EUA). This EUA will remain  in effect (meaning this test can be used) for the duration of the COVID-19 declaration under Section 564(b)(1) of the Act, 21 U.S.C.section 360bbb-3(b)(1), unless the authorization is terminated  or revoked sooner.       Influenza A by PCR NEGATIVE NEGATIVE Final   Influenza B by PCR NEGATIVE NEGATIVE Final    Comment: (NOTE) The Xpert Xpress SARS-CoV-2/FLU/RSV plus assay is intended as an aid in the diagnosis of influenza from Nasopharyngeal swab specimens and should not be used as a sole basis for treatment. Nasal washings and aspirates are unacceptable for Xpert Xpress SARS-CoV-2/FLU/RSV testing.  Fact Sheet for Patients: EntrepreneurPulse.com.au  Fact Sheet for Healthcare  Providers: IncredibleEmployment.be  This test is not yet approved or cleared by the [place] FDA and has been authorized for detection and/or diagnosis of SARS-CoV-2 by FDA under an Emergency Use Authorization (EUA). This EUA will remain in effect (meaning this test can be used) for the duration of the COVID-19 declaration under Section 564(b)(1) of the Act, 21 U.S.C. section 360bbb-3(b)(1), unless the authorization is terminated or revoked.  Performed at [facility], [address] [place], [place] [address]   Aerobic Culture (superficial specimen)     Status: None (Preliminary result)   Collection Time: 01/23/20  7:02 PM   Specimen: Wound  Result Value Ref Range Status   Specimen Description   Final    WOUND Performed at [facility], [address] [place], [place] [address]    Special Requests   Final    NONE Performed at [facility], [address] [place], [place] [address]    Gram Stain   Final    FEW WBC PRESENT, PREDOMINANTLY PMN NO ORGANISMS SEEN    Culture   Final    RARE STAPHYLOCOCCUS AUREUS SUSCEPTIBILITIES TO FOLLOW Performed at [facility], [place] [address] [place], [place] [address]    Report Status PENDING  Incomplete  Anaerobic culture     Status: None (Preliminary result)   Collection Time: 01/24/20 11:43 AM   Specimen: PATH Other; Wound  Result Value Ref Range Status   Specimen Description SYNOVIAL RIGHT KNEE  Final   Special Requests RECEIVED IN STERILE CUP  Final   Gram Stain   Final    ABUNDANT WBC PRESENT,BOTH PMN AND MONONUCLEAR NO ORGANISMS SEEN Performed at [facility], [address], [place], [place] [address]    Culture   Final    NO ANAEROBES ISOLATED; CULTURE IN PROGRESS FOR 5 DAYS   Report Status PENDING  Incomplete  Gram stain     Status: None   Collection Time: 01/24/20 11:43 AM   Specimen: PATH Other; Wound  Result Value Ref Range Status   Specimen  Description WOUND  Final   Special Requests NONE  Final   Gram Stain   Final    MODERATE WBC SEEN NO ORGANISMS SEEN RESULT CALLED TO, READ BACK BY AND VERIFIED WITH: Caralee Ates AT 1218 01/24/20 SDR Performed at [facility], [address] [place], [place] [address]    Report Status 01/24/2020 FINAL  Final  Aerobic/Anaerobic Culture (surgical/deep wound)     Status: None (Preliminary result)   Collection Time: 01/24/20 11:43 AM   Specimen: Synovial, Right Knee; Body Fluid  Result Value Ref Range Status   Specimen Description WOUND RIGHT KNEE  Final   Special Requests SUPERFICIAL SWAB  Final   Gram Stain   Final    RARE WBC PRESENT, PREDOMINANTLY MONONUCLEAR RARE GRAM POSITIVE COCCI IN CLUSTERS Performed at [facility], [place] [address] [place], [place] [address]    Culture   Final    RARE STAPHYLOCOCCUS AUREUS SUSCEPTIBILITIES TO FOLLOW NO ANAEROBES ISOLATED; CULTURE IN PROGRESS FOR 5 DAYS    Report Status PENDING  Incomplete  Body fluid culture     Status: None (Preliminary result)   Collection Time: 01/24/20 11:43 AM   Specimen: Synovium  Result Value Ref Range Status   Specimen Description SYNOVIAL FLUID RIGHT KNEE  Final   Special Requests RECEIVED IN A CUP  Final   Gram Stain   Final    ABUNDANT WBC PRESENT, PREDOMINANTLY PMN NO ORGANISMS SEEN    Culture   Final    RARE STAPHYLOCOCCUS AUREUS SUSCEPTIBILITIES TO FOLLOW CRITICAL RESULT CALLED TO, READ BACK BY AND VERIFIED WITH: PHARMD L KLUTTZ [id number] AT 903 BY CM Performed at [facility], [place] [address] [place], [place] [address]    Report Status PENDING  Incomplete  Gram stain     Status: None   Collection Time: 01/24/20 12:15 PM   Specimen: Wound  Result Value Ref Range Status   Specimen Description WOUND  Final   Special Requests C RIGHT KNEE  SUPRA PATELLAR POUCH   Final   Gram Stain   Final    MODERATE WBC SEEN NO ORGANISMS SEEN RESULT CALLED TO, READ BACK BY AND VERIFIED  WITHCaralee Ates AT [id number] 01/24/20 SDR Performed at [facility], [address] [place], [place] [address]    Report Status 01/24/2020 FINAL  Final  Aerobic/Anaerobic Culture (surgical/deep wound)     Status: None (Preliminary result)   Collection Time: 01/24/20 12:15 PM   Specimen: Wound  Result Value Ref Range Status   Specimen Description WOUND RIGHT KNEE  Final   Special Requests SPECIMEN C SUPRA PATELLAR POUCH  Final   Gram Stain   Final  RARE NO WBC SEEN NO ORGANISMS SEEN Performed at [facility], [address], [place], [place] [address]    Culture   Final    RARE STAPHYLOCOCCUS AUREUS CORRECTED RESULTS PREVIOUSLY REPORTED AS: STAPHYLOCOCCUS CAPITIS CORRECTED RESULTS CALLED TO: PHARMD L KLUTTZ [id number] AT 1333 BY CM SUSCEPTIBILITIES TO FOLLOW NO ANAEROBES ISOLATED; CULTURE IN PROGRESS FOR 5 DAYS    Report Status PENDING  Incomplete  Gram stain     Status: None   Collection Time: 01/24/20 12:20 PM   Specimen: Wound  Result Value Ref Range Status   Specimen Description WOUND  Final   Special Requests D RIGHT  KNEE LATERAL GUTTEN  Final   Gram Stain   Final    FEW WBC SEEN NO ORGANISMS SEEN TERESA BENSON AT 1303 01/24/20 SDR Performed at [facility], [address] [place], [place] [address]    Report Status 01/24/2020 FINAL  Final  Aerobic/Anaerobic Culture (surgical/deep wound)     Status: None (Preliminary result)   Collection Time: 01/24/20 12:20 PM   Specimen: Wound  Result Value Ref Range Status   Specimen Description   Final    WOUND Performed at [facility], [address] [place], [place] [address]    Special Requests  SPECIMEN D RIGHT KNEE LATERAL GUTTERS  Final   Gram Stain   Final    RARE WBC PRESENT,BOTH PMN AND MONONUCLEAR NO ORGANISMS SEEN Performed at [facility], [address] [place], [place] [address]    Culture   Final    RARE STAPHYLOCOCCUS AUREUS SUSCEPTIBILITIES TO FOLLOW NO  ANAEROBES ISOLATED; CULTURE IN PROGRESS FOR 5 DAYS    Report Status PENDING  Incomplete  Gram stain     Status: None   Collection Time: 01/24/20 12:22 PM   Specimen: Wound  Result Value Ref Range Status   Specimen Description WOUND  Final   Special Requests E RIGHT KNEE MEDIAL GUTTEN  Final   Gram Stain   Final    FEW WBC SEEN NO ORGANISMS SEEN RESULT CALLED TO, READ BACK BY AND VERIFIED WITHKnute Neu AT 1321 01/24/20 SDR Performed at [facility], [address], [place], [place] [address]    Report Status 01/24/2020 FINAL  Final  Aerobic/Anaerobic Culture (surgical/deep wound)     Status: None (Preliminary result)   Collection Time: 01/24/20 12:22 PM   Specimen: Wound  Result Value Ref Range Status   Specimen Description   Final    WOUND Performed at [facility], [address] [place], [place] [address]    Special Requests  SPECIMENE RIGHT KNEE MEDIAL GUTTEN  Final   Gram Stain   Final    FEW WBC PRESENT,BOTH PMN AND MONONUCLEAR NO ORGANISMS SEEN Performed at [facility], [address], [place], [place] [address]    Culture   Final    RARE STAPHYLOCOCCUS AUREUS SUSCEPTIBILITIES TO FOLLOW NO ANAEROBES ISOLATED; CULTURE IN PROGRESS FOR 5 DAYS    Report Status PENDING  Incomplete  Gram stain     Status: None   Collection Time: 01/24/20 12:24 PM   Specimen: Wound  Result Value Ref Range Status   Specimen Description WOUND  Final   Special Requests F RIGHT KNEE POSTERIOR RECESS  Final   Gram Stain   Final    MANY WBC SEEN NO ORGANISMS SEEN RESULT CALLED TO, READ BACK BY AND VERIFIED WITHKnute Neu AT 1321 01/24/20 SDR Performed at [facility],  [place], [place] [address]    Report Status 01/24/2020 FINAL  Final  Aerobic/Anaerobic Culture (surgical/deep wound)     Status: None (Preliminary result)   Collection Time: 01/24/20 12:24 PM   Specimen: Wound  Result Value Ref Range Status   Specimen  Description   Final    WOUND Performed at [facility], [address], [place], [place] [address]    Special Requests  SPECIMENF RIGHT KNEE POSTERIOI RECESS  Final   Gram Stain   Final    RARE WBC PRESENT, PREDOMINANTLY PMN RARE GRAM POSITIVE COCCI IN PAIRS Performed at [facility], [place] [address] [place], [place] [address]    Culture   Final    RARE STAPHYLOCOCCUS AUREUS SUSCEPTIBILITIES TO FOLLOW NO ANAEROBES ISOLATED; CULTURE IN PROGRESS FOR 5 DAYS    Report Status PENDING  Incomplete  Urine culture     Status: None   Collection Time: 01/24/20  2:49 PM   Specimen: Urine, Catheterized  Result Value Ref Range Status   Specimen Description   Final    URINE, CATHETERIZED Performed at [facility], [address] [place], [place] [address]    Special Requests   Final    NONE Performed at [facility], [address], [place], [place] [address]    Culture   Final    NO GROWTH Performed at [facility], [place] [address], [place], [place] [address]    Report Status 01/25/2020 FINAL  Final    Radiology Studies: No results found. Scheduled Meds:  acidophilus  1 capsule Oral Daily   vitamin C  500 mg Oral BID   calcium-vitamin D  2 tablet Oral Daily   carvedilol  3.125 mg Oral BID   enoxaparin (LOVENOX) injection  30 mg Subcutaneous Q12H   escitalopram  10 mg Oral Daily   feeding supplement  237 mL Oral BID BM   ferrous sulfate  325 mg Oral BID WC   influenza vaccine adjuvanted  0.5 mL Intramuscular Tomorrow-1000   loratadine  10 mg Oral Daily   magnesium hydroxide  30 mL Oral Daily   multivitamin with minerals  1 tablet Oral Daily   multivitamin-lutein  1 capsule Oral BID   pantoprazole  40 mg Oral BID   Continuous Infusions:  tranexamic acid     vancomycin 750 mg (01/27/20 1148)     LOS: 5 days    Time spent: 25 mins    Marra Fraga, MD [facility]   If 7PM-7AM, please contact  night-coverage

## 2020-01-27 NOTE — Progress Notes (Signed)
   Date of Admission:  01/22/2020   Husband at bed side  Subjective: She is feeling better Pain better No fever  Medications:  . acidophilus  1 capsule Oral Daily  . vitamin C  500 mg Oral BID  . calcium-vitamin D  2 tablet Oral Daily  . carvedilol  3.125 mg Oral BID  . enoxaparin (LOVENOX) injection  30 mg Subcutaneous Q12H  . escitalopram  10 mg Oral Daily  . feeding supplement  237 mL Oral BID BM  . ferrous sulfate  325 mg Oral BID WC  . influenza vaccine adjuvanted  0.5 mL Intramuscular Tomorrow-1000  . loratadine  10 mg Oral Daily  . magnesium hydroxide  30 mL Oral Daily  . multivitamin with minerals  1 tablet Oral Daily  . multivitamin-lutein  1 capsule Oral BID  . pantoprazole  40 mg Oral BID    Objective: Vital signs in last 24 hours: Temp:  [98.2 F (36.8 C)-98.6 F (37 C)] 98.4 F (36.9 C) (12/27 0516) Pulse Rate:  [72-86] 86 (12/27 0516) Resp:  [17-20] 17 (12/27 0516) BP: (116-129)/(50-67) 116/66 (12/27 0516) SpO2:  [97 %-100 %] 98 % (12/27 0516)  PHYSICAL EXAM:  General: Alert, cooperative, no distress, pale Head: Normocephalic, without obvious abnormality, atraumatic. Eyes: Conjunctivae clear, anicteric sclerae. Pupils are equal ENT Nares normal. No drainage or sinus tenderness. Lips, mucosa, and tongue normal. No Thrush Neck: Supple, symmetrical, no adenopathy, thyroid: non tender no carotid bruit and no JVD. Lungs: Clear to auscultation bilaterally. No Wheezing or Rhonchi. No rales. Heart: Regular rate and rhythm, no murmur, rub or gallop. Abdomen: Soft, non-tender,not distended. Bowel sounds normal. No masses Extremities: rt knee has dressing, wound vac Left ankle wound Skin: No rashes or lesions. Or bruising Lymph: Cervical, supraclavicular normal. 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  Anemia.  Chronic infection could be contributing. Patient has a history of GI bleed.  MSSA bacteremia in Jan 2021- receievd 6 weeks of IV antibiotics  Discussed with patient and her husband and Dr.Hooten

## 2020-01-28 ENCOUNTER — Inpatient Hospital Stay: Payer: Self-pay

## 2020-01-28 DIAGNOSIS — T8450XA Infection and inflammatory reaction due to unspecified internal joint prosthesis, initial encounter: Secondary | ICD-10-CM

## 2020-01-28 DIAGNOSIS — D649 Anemia, unspecified: Secondary | ICD-10-CM | POA: Diagnosis not present

## 2020-01-28 DIAGNOSIS — A4901 Methicillin susceptible Staphylococcus aureus infection, unspecified site: Secondary | ICD-10-CM

## 2020-01-28 DIAGNOSIS — L02415 Cutaneous abscess of right lower limb: Secondary | ICD-10-CM | POA: Diagnosis not present

## 2020-01-28 LAB — COMPREHENSIVE METABOLIC PANEL
ALT: 9 U/L (ref 0–44)
AST: 11 U/L — ABNORMAL LOW (ref 15–41)
Albumin: 2.3 g/dL — ABNORMAL LOW (ref 3.5–5.0)
Alkaline Phosphatase: 98 U/L (ref 38–126)
Anion gap: 7 (ref 5–15)
BUN: 11 mg/dL (ref 8–23)
CO2: 27 mmol/L (ref 22–32)
Calcium: 10.2 mg/dL (ref 8.9–10.3)
Chloride: 103 mmol/L (ref 98–111)
Creatinine, Ser: 0.65 mg/dL (ref 0.44–1.00)
GFR, Estimated: >60 mL/min (ref >60)
Glucose, Bld: 107 mg/dL — ABNORMAL HIGH (ref 70–99)
Potassium: 3.7 mmol/L (ref 3.5–5.1)
Sodium: 137 mmol/L (ref 135–145)
Total Bilirubin: 0.6 mg/dL (ref 0.3–1.2)
Total Protein: 5.9 g/dL — ABNORMAL LOW (ref 6.5–8.1)

## 2020-01-28 LAB — SEDIMENTATION RATE: Sed Rate: 91 mm/hr — ABNORMAL HIGH (ref 0–22)

## 2020-01-28 LAB — AEROBIC CULTURE W GRAM STAIN (SUPERFICIAL SPECIMEN)

## 2020-01-28 LAB — C-REACTIVE PROTEIN: CRP: 9.5 mg/dL — ABNORMAL HIGH (ref <1.0)

## 2020-01-28 LAB — PHOSPHORUS: Phosphorus: 3.2 mg/dL (ref 2.5–4.6)

## 2020-01-28 LAB — ACID FAST SMEAR (AFB, MYCOBACTERIA): Acid Fast Smear: NEGATIVE

## 2020-01-28 LAB — HEMOGLOBIN AND HEMATOCRIT, BLOOD
HCT: 28.2 % — ABNORMAL LOW (ref 36.0–46.0)
Hemoglobin: 8.5 g/dL — ABNORMAL LOW (ref 12.0–15.0)

## 2020-01-28 LAB — MAGNESIUM: Magnesium: 2 mg/dL (ref 1.7–2.4)

## 2020-01-28 LAB — BODY FLUID CULTURE

## 2020-01-28 MED ORDER — PANTOPRAZOLE SODIUM 40 MG PO TBEC
40.0000 mg | DELAYED_RELEASE_TABLET | Freq: Two times a day (BID) | ORAL | Status: DC
  Administered 2020-01-28 – 2020-01-31 (×8): 40 mg via ORAL
  Filled 2020-01-28 (×8): qty 1

## 2020-01-28 MED ORDER — MAGNESIUM HYDROXIDE 400 MG/5ML PO SUSP
30.0000 mL | Freq: Every day | ORAL | Status: DC | PRN
Start: 2020-01-28 — End: 2020-02-01
  Administered 2020-01-29: 30 mL via ORAL
  Filled 2020-01-28: qty 30

## 2020-01-28 MED ORDER — RIFAMPIN 300 MG PO CAPS
300.0000 mg | ORAL_CAPSULE | Freq: Two times a day (BID) | ORAL | Status: DC
  Administered 2020-01-28 – 2020-01-30 (×4): 300 mg via ORAL
  Filled 2020-01-28 (×5): qty 1

## 2020-01-28 MED ORDER — CEFAZOLIN SODIUM-DEXTROSE 2-4 GM/100ML-% IV SOLN
2.0000 g | Freq: Three times a day (TID) | INTRAVENOUS | Status: DC
  Administered 2020-01-28 – 2020-02-01 (×11): 2 g via INTRAVENOUS
  Filled 2020-01-28 (×14): qty 100

## 2020-01-28 NOTE — Progress Notes (Signed)
Occupational Therapy Treatment Patient Details Name: Jasmine Morrow MRN: [id number] DOB: Apr 19, 1945 Today's Date: 01/28/2020    History of present illness Pt is a 74 y/o F who was admitted on 01/22/20 after presenting to ED with R ankle swelling, pain, & draining wound which has developed over the last 5 days. Pt admitted for sepsis 2/2 wound infection. 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 PMH: asthma, HTN, OSA, GERD, anxiety.   OT comments  Pt seen for OT treatment on this date. Upon arrival to room pt was seated upright in bed, teary-eyed following discussion with infectious disease specialist, however was agreeable to session following encouragement. Pt A&O x 4. Pt instructed on polar care mgt, falls prevention strategies, home/routines modifications, and DME/AE for LB bathing and dressing tasks; pt received paper handout on polar care mgt. Pt verbalized understanding of instruction provided, but would benefit from return demonstration in future sessions. Pt currently requires maximal assist for LB dressing while in seated position due to pain and limited AROM of L knee. Pt continues to benefit from skilled OT services to maximize return to PLOF and minimize risk of future falls, injury, caregiver burden, and readmission. Will continue to follow POC. Discharge recommendation to be changed to home health and intermittent supervision.   Follow Up Recommendations  Home health OT;Supervision - Intermittent          Precautions / Restrictions Precautions Precautions: Fall Precaution Comments: No ROM to R Knee Required Braces or Orthoses: Knee Immobilizer - Right Knee Immobilizer - Right: On when out of bed or walking Restrictions Weight Bearing Restrictions: Yes RLE Weight Bearing: Weight bearing as tolerated              ADL either performed or assessed with clinical judgement   ADL Overall ADL's : Needs assistance/impaired                      Lower Body Dressing: Maximal assistance Lower Body Dressing Details (indicate cue type and reason): to don/doff socks and SCDs               General ADL Comments: Pt states that her husband is able to assist with all ADLs at home, but that he has some mobility challenges. Pt reports receiving home health services in the past and stated that she would like to receive them for help with ADLs at home               Cognition Arousal/Alertness: Awake/alert Behavior During Therapy: [facility] for tasks assessed/performed Overall Cognitive Status: Within Functional Limits for tasks assessed                                 General Comments: pt is A and O x 4. Upon arrival, pt appeared teary-eyed following appointment with infectious disease specialist                   Pertinent Vitals/ Pain       Faces Pain Scale: Hurts a little bit         Frequency  Min 1X/week        Progress Toward Goals  OT Goals(current goals can now be found in the care plan section)  Progress towards OT goals: Progressing toward goals  Acute Rehab OT Goals Patient Stated Goal: to go home OT Goal Formulation: With patient Time For Goal Achievement:  02/08/20 Potential to Achieve Goals: Good  Plan Discharge plan needs to be updated       AM-PAC OT "6 Clicks" Daily Activity     Outcome Measure   Help from another person eating meals?: None Help from another person taking care of personal grooming?: A Little Help from another person toileting, which includes using toliet, bedpan, or urinal?: A Lot Help from another person bathing (including washing, rinsing, drying)?: A Lot Help from another person to put on and taking off regular upper body clothing?: A Little Help from another person to put on and taking off regular lower body clothing?: A Lot 6 Click Score: 16    End of Session Equipment Utilized During Treatment: Right knee immobilizer  OT Visit  Diagnosis: Unsteadiness on feet (R26.81);Muscle weakness (generalized) (M62.81)   Activity Tolerance Patient tolerated treatment well   Patient Left in bed;with call bell/phone within reach;with bed alarm set   Nurse Communication Precautions;Mobility status        Time: 1420-1459 OT Time Calculation (min): 39 min  Charges: OT General Charges $OT Visit: 1 Visit OT Treatments $Self Care/Home Management : 8-22 mins $Therapeutic Activity: 23-37 mins  Fredirick Maudlin, OTR/L ASCOM: [id number]   01/28/2020, 3:19 PM

## 2020-01-28 NOTE — Progress Notes (Signed)
Physical Therapy Treatment Patient Details Name: Jasmine Morrow MRN: [id number] DOB: 1945-03-12 Today's Date: 01/28/2020    History of Present Illness Pt is a 74 y/o F who was admitted on 01/22/20 after presenting to ED with R ankle swelling, pain, & draining wound which has developed over the last 5 days. Pt admitted for sepsis 2/2 wound infection. 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 PMH: asthma, HTN, OSA, GERD, anxiety    PT Comments    Pt was long sitting in bed upon arriving. She is waiting to eat breakfast and was agreeable to session. She wanted to attempt OOB ambulation. Was able to exit L side of bed with increased time however no physical assistance. Stood from elevated bed height with mod assist, Has difficulty sit > stand due to poor knee flexion on LLE. RLE ion knee immobilizer throughout all OOB activity. Once standing, pt is able to take steps using RW with CGA only. She did fatigue quickly and was unwilling to sit up in recliner. Once assisted(min assist) back to bed, PA arrived. She does not feel she will need rehab. Has a supportive spouse who is available 24 hours a day to assist at home. Recommend HHPT at DC to address deficits while improve ROM,strength, and overall safety with ADL. Pt has all needed equipment.    Follow Up Recommendations  Home health PT;Supervision for mobility/OOB;Supervision/Assistance - 24 hour     Equipment Recommendations  None recommended by PT    Recommendations for Other Services       Precautions / Restrictions Precautions Precautions: Fall Precaution Comments: NO ROM to R Knee Required Braces or Orthoses: Knee Immobilizer - Right Knee Immobilizer - Right: On when out of bed or walking Restrictions Weight Bearing Restrictions: Yes RLE Weight Bearing: Weight bearing as tolerated    Mobility  Bed Mobility Overal bed mobility: Needs Assistance Bed Mobility: Supine to Sit;Sit to  Supine     Supine to sit: Supervision;HOB elevated Sit to supine: Min assist   General bed mobility comments: supervision to achieve EOB short sit however min assist to progress RLE back intobed after OOB activity  Transfers Overall transfer level: Needs assistance Equipment used: Rolling walker (2 wheeled) Transfers: Sit to/from Stand Sit to Stand: Mod assist;From elevated surface         General transfer comment: Pt has poor knee flexon which impacts her ability to stand without mod assist. Bed height elevated to achieve.  Ambulation/Gait Ambulation/Gait assistance: Min guard Gait Distance (Feet): 15 Feet Assistive device: Rolling walker (2 wheeled) Gait Pattern/deviations: Step-to pattern;Antalgic Gait velocity: decreased   General Gait Details: Pt was able to ambulate with knee immobilizer donned with slow step to antalgic gait pattern      Balance Overall balance assessment: Needs assistance Sitting-balance support: No upper extremity supported;Feet supported Sitting balance-Leahy Scale: Good Sitting balance - Comments: no LOB in sitting   Standing balance support: Bilateral upper extremity supported Standing balance-Leahy Scale: Fair Standing balance comment: reliant on UE support to maintain balance however no LOB noted during standing activity           Cognition Arousal/Alertness: Awake/alert Behavior During Therapy: WFL for tasks assessed/performed Overall Cognitive Status: Within Functional Limits for tasks assessed        General Comments: pt is A and O x 4,anxious with mobility.             Pertinent Vitals/Pain Pain Assessment: 0-10 Pain Score: 7  Pain Location:  RLE Pain Descriptors / Indicators: Discomfort Pain Intervention(s): Limited activity within patient's tolerance;Monitored during session;Premedicated before session;Repositioned;Ice applied           PT Goals (current goals can now be found in the care plan section) Acute Rehab  PT Goals Patient Stated Goal: to be as independent as possible Progress towards PT goals: Progressing toward goals    Frequency    BID      PT Plan Current plan remains appropriate       AM-PAC PT "6 Clicks" Mobility   Outcome Measure  Help needed turning from your back to your side while in a flat bed without using bedrails?: A Little Help needed moving from lying on your back to sitting on the side of a flat bed without using bedrails?: A Little Help needed moving to and from a bed to a chair (including a wheelchair)?: A Lot Help needed standing up from a chair using your arms (e.g., wheelchair or bedside chair)?: A Lot Help needed to walk in hospital room?: A Lot Help needed climbing 3-5 steps with a railing? : A Lot 6 Click Score: 14    End of Session Equipment Utilized During Treatment: Right knee immobilizer;Gait belt Activity Tolerance: Patient tolerated treatment well;Patient limited by fatigue Patient left: in bed;with call bell/phone within reach;with bed alarm set Nurse Communication: Mobility status;Precautions PT Visit Diagnosis: Muscle weakness (generalized) (M62.81);Difficulty in walking, not elsewhere classified (R26.2);Pain Pain - Right/Left: Right Pain - part of body: Leg     Time: 0830-0908 PT Time Calculation (min) (ACUTE ONLY): 38 min  Charges:  $Gait Training: 8-22 mins $Therapeutic Exercise: 23-37 mins                     Julaine Fusi PTA 01/28/20, 10:51 AM

## 2020-01-28 NOTE — Progress Notes (Signed)
ID Pt doing okay No fever Pain under control Patient Vitals for the past 24 hrs:  BP Temp Temp src Pulse Resp SpO2  01/28/20 1535 (!) 116/59 98.1 F (36.7 C) Oral 84 16 98 %  01/28/20 1211 (!) 131/58 98.2 F (36.8 C) Oral 68 15 100 %  01/28/20 0700 119/66 (!) 97.2 F (36.2 C) Oral 91 20 91 %  01/28/20 0359 (!) 120/58 97.9 F (36.6 C) Oral 91 20 97 %  01/27/20 2325 (!) 113/54 98.2 F (36.8 C) -- 84 18 100 %  01/27/20 2058 123/63 98.7 F (37.1 C) -- 84 19 100 %   Awake, alert Chest b/l air entry [id number] abd soft Rt knee- dressing not removed  Labs CBC Latest Ref Rng & Units 01/28/2020 01/27/2020 01/26/2020  WBC 4.0 - 10.5 K/uL - - 10.5  Hemoglobin 12.0 - 15.0 g/dL 6.6(A) 6.3(K) 1.6(W)  Hematocrit 36.0 - 46.0 % 28.2(L) 27.9(L) 28.8(L)  Platelets 150 - 400 K/uL - - 485(H)   CMP Latest Ref Rng & Units 01/28/2020 01/26/2020 01/25/2020  Glucose 70 - 99 mg/dL 109(N) 235(T) 732(K)  BUN 8 - 23 mg/dL 11 11 10   Creatinine 0.44 - 1.00 mg/dL 0.25 4.27  Sodium 135 - 145 mmol/L 137 136 139  Potassium 3.5 - 5.1 mmol/L 3.7 3.8 4.2  Chloride 98 - 111 mmol/L 103 103 106  CO2 22 - 32 mmol/L 27 26 26   Calcium 8.9 - 10.3 mg/dL 0.62 9.7  Total Protein 6.5 - 8.1 g/dL 5.9(L) - -  Total Bilirubin 0.3 - 1.2 mg/dL 0.6 - -  Alkaline Phos 38 - 126 U/L 98 - -  AST 15 - 41 U/L 11(L) - -  ALT 0 - 44 U/L 9 - -    01/23/20 ankle wound - Staph aureus-( not MRSA) Rt knee 6/6 culture staph aureus--all methicillin sensitive ( previously reported staph capitis changed to staph aureus  Impression/recommendation 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 Underwent right knee arthrotomy, irrigation and debridement and polyexchange on 01/24/2020.  Currently on vancomycin.  We will discontinue that and change to cefazolin 2 g IV every 8 along with rifampin 300 mg p.o. twice daily She will need initially 6 weeks of IV antibiotic and then p.o. for a total of 6 months.   May need suppressive therapy indefinitely if the knee hardware is going to be in place.  Complicated infection of the right ankle.  Had a fracture and later ORIF early this year.  Recurrent MSSA infection of the right oral.  Recent cultures have been MSSA as well.  Patient has hardware and the chances of eradicating the infection is minimal.  Anemia.  Chronic infection could be contributing to this.  Patient has a history of GI bleed.  MSSA bacteremia in January 2021 and received 6 weeks of IV antibiotics.  Discussed the management with the patient in great detail. We will place OPAT order

## 2020-01-28 NOTE — TOC Initial Note (Signed)
Transition of Care [facility]) - Initial/Assessment Note    Patient Details  Name: Jasmine Morrow MRN: [id number] Date of Birth: Oct 11, 1945  Transition of Care [facility]) CM/SW Contact:    Maree Krabbe, LCSW Phone Number: 01/28/2020, 3:18 PM  Clinical Narrative:  Pt states she was getting HH prior to admission. Pt is active with [facility] receiving RN and PT. Pt lives with spouse. Pt is agreeable to resume services. Pt states she has all the equipment she needs at home. Barbara Cower with [facility] notified. Will need HH orders for pt to resume services after d/c.                 Expected Discharge Plan: Home w Home Health Services Barriers to Discharge: Continued Medical Work up   Patient Goals and CMS Choice Patient states their goals for this hospitalization and ongoing recovery are:: to get better   Choice offered to / list presented to : Patient  Expected Discharge Plan and Services Expected Discharge Plan: Home w Home Health Services In-house Referral: Clinical Social Work   Post Acute Care Choice: Home Health Living arrangements for the past 2 months: Single Family Home                   DME Agency: NA       HH Arranged: RN,PT HH Agency: [facility] Date HH Agency Contacted: 01/28/20 Time HH Agency Contacted: 1517 Representative spoke with at [facility] Agency: Barbara Cower  Prior Living Arrangements/Services Living arrangements for the past 2 months: Single Family Home Lives with:: Spouse Patient language and need for interpreter reviewed:: Yes Do you feel safe going back to the place where you live?: Yes      Need for Family Participation in Patient Care: Yes (Comment) Care giver support system in place?: Yes (comment) Current home services: Home PT,Home RN Criminal Activity/Legal Involvement Pertinent to Current Situation/Hospitalization: No - Comment as needed  Activities of Daily Living Home Assistive Devices/Equipment: Eyeglasses,Wheelchair ADL Screening  (condition at time of admission) Patient's cognitive ability adequate to safely complete daily activities?: Yes Is the patient deaf or have difficulty hearing?: Yes Does the patient have difficulty seeing, even when wearing glasses/contacts?: No Does the patient have difficulty concentrating, remembering, or making decisions?: No Patient able to express need for assistance with ADLs?: Yes Does the patient have difficulty dressing or bathing?: No Independently performs ADLs?: No Communication: Independent Dressing (OT): Needs assistance Is this a change from baseline?: Pre-admission baseline Grooming: Needs assistance Is this a change from baseline?: Pre-admission baseline Feeding: Independent Bathing: Needs assistance Is this a change from baseline?: Pre-admission baseline Toileting: Needs assistance Is this a change from baseline?: Pre-admission baseline In/Out Bed: Needs assistance Is this a change from baseline?: Pre-admission baseline Walks in Home: Needs assistance Is this a change from baseline?: Pre-admission baseline Does the patient have difficulty walking or climbing stairs?: Yes Weakness of Legs: Both Weakness of Arms/Hands: None  Permission Sought/Granted Permission sought to share information with : Family Supports    Share Information with NAME: Iantha Fallen  Permission granted to share info w AGENCY: [facility]  Permission granted to share info w Relationship: spouse     Emotional Assessment Appearance:: Appears stated age Attitude/Demeanor/Rapport: Engaged Affect (typically observed): Accepting Orientation: : Oriented to Self,Oriented to Place,Oriented to  Time,Oriented to Situation Alcohol / Substance Use: Not Applicable Psych Involvement: No (comment)  Admission diagnosis:  Abscess [L02.91] Wound infection [T14.8XXA, L08.9] Abscess of right knee [L02.415] Patient Active Problem List  Diagnosis Date Noted  . Abscess of right knee 01/22/2020  . Pressure  injury of skin 02/28/2019  . SOB (shortness of breath)   . Gastrointestinal hemorrhage   . Sepsis ([place])   . Symptomatic anemia 02/20/2019  . Trimalleolar fracture of ankle, closed, right, initial encounter 02/07/2019  . Multiple lung nodules 03/31/2014  . 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 OSA on CPAP 03/27/2014   PCP:  Rusty Aus, MD Pharmacy:   [facility] [address] [place] [address] Phone: [phone] Fax: [place], [place] [address] [place] [address] Phone: [phone] Fax: [place], [place] [address] Phone: [phone] Fax: [phone]  [place], [place] - [place] [address] Phone: [phone] Fax: [phone]     Social Determinants of Health (SDOH) Interventions    Readmission Risk Interventions No flowsheet data found.

## 2020-01-28 NOTE — Progress Notes (Signed)
Physical Therapy Treatment Patient Details Name: Jasmine Morrow MRN: [id number] DOB: 05-01-1945 Today's Date: 01/28/2020    History of Present Illness Pt is a 74 y/o F who was admitted on 01/22/20 after presenting to ED with R ankle swelling, pain, & draining wound which has developed over the last 5 days. Pt admitted for sepsis 2/2 wound infection. 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 PMH: asthma, HTN, OSA, GERD, anxiety.    PT Comments    Pt was supine in bed with H)B elevated ~ 20 degrees and knee immobilizer donned. Readjusted knee immobilizer priro to performing OOB activity. She was able to exit L side of bed without assistance. Stood from elevated bed height with mod assist and then was able to ambulate with RW with CGA. Overall was able to improve gait kinemtics and improve safety with gait training. Pt's spouse was not in today but will be tomorrow. Plan to demonstrate proper techniques for brace, OOB activity, to improve safety with DC home with HHPT. Pt's spouse will be caregiver 24 hours a day. Will return in AM to address deficits while continuing to progress current POC. Pt was in bed at conclusion of session with call bell in reach,polar care reapplied, towel roll under RLE to promote extension and RN tech aware of pt's abilities.    Follow Up Recommendations  Home health PT;Supervision for mobility/OOB;Supervision/Assistance - 24 hour     Equipment Recommendations  None recommended by PT    Recommendations for Other Services       Precautions / Restrictions Precautions Precautions: Fall Precaution Comments: No ROM to R Knee Required Braces or Orthoses: Knee Immobilizer - Right Knee Immobilizer - Right: On when out of bed or walking Restrictions Weight Bearing Restrictions: Yes RLE Weight Bearing: Weight bearing as tolerated    Mobility  Bed Mobility Overal bed mobility: Needs Assistance Bed Mobility: Supine  to Sit;Sit to Supine     Supine to sit: Supervision;HOB elevated Sit to supine: Supervision      Transfers Overall transfer level: Needs assistance Equipment used: Rolling walker (2 wheeled) Transfers: Sit to/from Stand Sit to Stand: Mod assist;From elevated surface         General transfer comment: Pt has poor knee flexon which impacts her ability to stand without mod assist. Bed height elevated to achieve.  Ambulation/Gait Ambulation/Gait assistance: Min guard Gait Distance (Feet): 25 Feet Assistive device: Rolling walker (2 wheeled) Gait Pattern/deviations: Antalgic;Trunk flexed;Wide base of support;Step-to pattern (knee immobilizer donned throughout) Gait velocity: decreased   General Gait Details: Pt was easily able to ambulate o doorway of room and return 2 x without LOB. does endorse fatigue and c/o increased pain       Balance Overall balance assessment: Needs assistance Sitting-balance support: No upper extremity supported;Feet supported Sitting balance-Leahy Scale: Good Sitting balance - Comments: no LOB in sitting   Standing balance support: Bilateral upper extremity supported Standing balance-Leahy Scale: Fair Standing balance comment: reliant on UE support to maintain balance however no LOB noted during standing activity       Cognition Arousal/Alertness: Awake/alert Behavior During Therapy: WFL for tasks assessed/performed Overall Cognitive Status: Within Functional Limits for tasks assessed      General Comments: pt is A and O x 4.             Pertinent Vitals/Pain Pain Assessment: 0-10 Pain Score: 5  Faces Pain Scale: Hurts a little bit Pain Location: RLE Pain Descriptors / Indicators: Discomfort  Pain Intervention(s): Limited activity within patient's tolerance;Monitored during session;Premedicated before session;Repositioned;Ice applied           PT Goals (current goals can now be found in the care plan section) Acute Rehab PT  Goals Patient Stated Goal: to go home Progress towards PT goals: Progressing toward goals    Frequency    BID      PT Plan Current plan remains appropriate       AM-PAC PT "6 Clicks" Mobility   Outcome Measure  Help needed turning from your back to your side while in a flat bed without using bedrails?: A Little Help needed moving from lying on your back to sitting on the side of a flat bed without using bedrails?: A Little Help needed moving to and from a bed to a chair (including a wheelchair)?: A Little Help needed standing up from a chair using your arms (e.g., wheelchair or bedside chair)?: A Lot Help needed to walk in hospital room?: A Little Help needed climbing 3-5 steps with a railing? : A Lot 6 Click Score: 16    End of Session Equipment Utilized During Treatment: Right knee immobilizer;Gait belt Activity Tolerance: Patient tolerated treatment well;Patient limited by fatigue Patient left: in bed;with call bell/phone within reach;with bed alarm set Nurse Communication: Mobility status;Precautions PT Visit Diagnosis: Muscle weakness (generalized) (M62.81);Difficulty in walking, not elsewhere classified (R26.2);Pain Pain - Right/Left: Right Pain - part of body: Leg     Time: [id number] PT Time Calculation (min) (ACUTE ONLY): 20 min  Charges:  $Gait Training: 8-22 mins                     Julaine Fusi PTA 01/28/20, 4:11 PM

## 2020-01-28 NOTE — Progress Notes (Signed)
PROGRESS NOTE    Jasmine Morrow  [id number] DOB: [birth year] DOA: 01/22/2020 PCP: Rusty Aus, MD   Brief Narrative:  This 74 years old female with PMH of asthma, hypertension, obstructive sleep apnea, GERD, anxiety presents to the emergency department with acute onset of right ankle swelling and pain with draining wound which has developed over the last 5 days.  Patient was seen in the wound care clinic and was sent to the emergency department after she was found to have significant drainage from her wound.  Patient reports that she has complication from right ankle surgery and hardware infection and underwent several debridement surgeries in this year. Patient is admitted for sepsis secondary to wound infection.  Patient was started on ceftriaxone and vancomycin,  infectious disease and orthopedics consulted. She is found to have anemia , has received 1 unit PRBC. Hb 8.8. 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 Postoperative day 4.  Assessment & Plan:   Active Problems:   Abscess of right knee   Sepsis secondary to right knee abscess with right knee effusion: - Patient presented with tachycardia, tachypnea, hypotension, lactic acid 2.7, obvious source of infection. -Admitted to medical monitored bed. -Continue empiric antibiotics with IV vancomycin and Rocephin. - Blood cultures: no growth so far.  Wound culture growing staph aureus,  sensitivity pending. - Adequate pain control tramadol as needed. -Orthopedics Dr. Marry Guan consulted.   - Patient underwent total knee revision with a scar debridement and patella revision with polyexchange postoperative day 4. - Infectious disease was consulted, recommended to continue current antibiotics. - Currently on vancomycin- will change to cefazolin if it grows MSSA- will also add rifampin-  - She will need 6 months of antibiotics course - ( 6 weeks IV and then PO) - PICC line order  placed.   2.  Essential hypertension Continue Coreg.  Blood pressure is improving.  3.  Depression.    Continue Lexapro  4.  GERD.    Continue Protonix.  5.  Asthma.    Continue as needed albuterol  6.  Microcytic hypochromic anemia. Baseline hemoglobin runs between 8 and 9. Hemoglobin 7.3.  S/p Transfuse 1 PRBC .  Hemoglobin 8.5  Iron profile-pending   DVT prophylaxis: Lovenox Code Status: Full code Family Communication: No family at bedside Disposition Plan:   Status is: Inpatient  Remains inpatient appropriate because:Inpatient level of care appropriate due to severity of illness   Dispo: The patient is from: Home              Anticipated d/c is to: SNF              Anticipated d/c date is: > 3 days              Patient currently is not medically stable to d/c.  Consultants:   Orthopedics  Infectious disease  Procedures: None Antimicrobials:  Anti-infectives (From admission, onward)   Start     Dose/Rate Route Frequency Ordered Stop   01/28/20 2200  ceFAZolin (ANCEF) IVPB 2g/100 mL premix        2 g 200 mL/hr over 30 Minutes Intravenous Every 8 hours 01/28/20 1424     01/28/20 2200  rifampin (RIFADIN) capsule 300 mg        300 mg Oral Every 12 hours 01/28/20 1424     01/24/20 1235  vancomycin (VANCOCIN) powder  Status:  Discontinued          As needed  01/24/20 1235 01/24/20 1434   01/24/20 1234  gentamicin (GARAMYCIN) injection  Status:  Discontinued          As needed 01/24/20 1234 01/24/20 1434   01/23/20 2200  cefTRIAXone (ROCEPHIN) 2 g in sodium chloride 0.9 % 100 mL IVPB  Status:  Discontinued        2 g 200 mL/hr over 30 Minutes Intravenous Every 24 hours 01/23/20 0935 01/27/20 1113   01/23/20 2200  vancomycin (VANCOREADY) IVPB 750 mg/150 mL        750 mg 150 mL/hr over 60 Minutes Intravenous Every 12 hours 01/23/20 0959     01/23/20 0800  vancomycin (VANCOCIN) IVPB 1000 mg/200 mL premix  Status:  Discontinued        1,000 mg 200 mL/hr over  60 Minutes Intravenous Every 12 hours 01/23/20 0331 01/23/20 0959   01/22/20 2230  vancomycin (VANCOCIN) IVPB 1000 mg/200 mL premix  Status:  Discontinued        1,000 mg 200 mL/hr over 60 Minutes Intravenous  Once 01/22/20 2220 01/23/20 0033   01/22/20 2230  ceFAZolin (ANCEF) 2 g in dextrose 5 % 100 mL IVPB  Status:  Discontinued        2 g 240 mL/hr over 30 Minutes Intravenous Every 8 hours 01/22/20 2220 01/23/20 0935   01/22/20 2145  vancomycin (VANCOCIN) IVPB 1000 mg/200 mL premix        1,000 mg 200 mL/hr over 60 Minutes Intravenous  Once 01/22/20 2139 01/22/20 2348   01/22/20 2145  cefTRIAXone (ROCEPHIN) 2 g in sodium chloride 0.9 % 100 mL IVPB        2 g 200 mL/hr over 30 Minutes Intravenous  Once 01/22/20 2139 01/22/20 2229      Subjective: Patient was seen and examined at bedside.  Overnight events noted.   Patient status post ORIF postoperative day 4.  She reports still having pain but better.Patient has participated in physical therapy , still reports having significant pain.  Objective: Vitals:   01/27/20 2325 01/28/20 0359 01/28/20 0700 01/28/20 1211  BP: (!) 113/54 (!) 120/58 119/66 (!) 131/58  Pulse: 84 91 91 68  Resp: 18 20 20 15   Temp: 98.2 F (36.8 C) 97.9 F (36.6 C) (!) 97.2 F (36.2 C) 98.2 F (36.8 C)  TempSrc:  Oral Oral Oral  SpO2: 100% 97% 91% 100%  Weight:      Height:        Intake/Output Summary (Last 24 hours) at 01/28/2020 1436 Last data filed at 01/28/2020 0948 Gross per 24 hour  Intake 0 ml  Output --  Net 0 ml   Filed Weights   01/23/20 0210  Weight: 88.5 kg    Examination:  General exam: Appears calm and comfortable, not in any acute distress. Respiratory system: Clear to auscultation. Respiratory effort normal. Cardiovascular system: S1 & S2 heard, RRR. No JVD, murmurs, rubs, gallops or clicks. No pedal edema. Gastrointestinal system: Abdomen is nondistended, soft and nontender. No organomegaly or masses felt. Normal bowel  sounds heard. Central nervous system: Alert and oriented. No focal neurological deficits. Extremities: Right leg in dressing.  In immobilizer.   Skin: No rashes, lesions or ulcers Psychiatry: Judgement and insight appear normal. Mood & affect appropriate.     Data Reviewed: I have personally reviewed following labs and imaging studies  CBC: Recent Labs  Lab 01/22/20 1915 01/23/20 0540 01/23/20 0824 01/24/20 0538 01/25/20 0450 01/26/20 0550 01/27/20 0524 01/28/20 0621  WBC 15.5* 9.9  --   --  11.7* 10.5  --   --   NEUTROABS 12.8*  --   --   --   --   --   --   --   HGB 10.0* 7.7*   < > 8.8* 8.3* 8.5* 8.2* 8.5*  HCT 33.2* 25.1*   < > 28.5* 27.8* 28.8* 27.9* 28.2*  MCV 70.2* 69.7*  --   --  72.6* 73.3*  --   --   PLT 797* 668*  --   --  492* 485*  --   --    < > = values in this interval not displayed.   Basic Metabolic Panel: Recent Labs  Lab 01/23/20 0540 01/24/20 0538 01/25/20 0450 01/26/20 0550 01/28/20 0621  NA 140 142 139 136 137  K 3.8 3.9 4.2 3.8 3.7  CL 108 110 106 103 103  CO2 26 25 26 26 27   GLUCOSE 102* 94 115* 103* 107*  BUN 12 9 10 11 11   CREATININE 0.83 0.71 0.80 0.66 0.65  CALCIUM 9.8 9.6 9.7 10.3 10.2  MG  --  1.9 1.8 2.1 2.0  PHOS  --  3.8 3.3 2.9 3.2   GFR: Estimated Creatinine Clearance: 74.3 mL/min (by C-G formula based on SCr of 0.65 mg/dL). Liver Function Tests: Recent Labs  Lab 01/22/20 1915 01/28/20 0621  AST 20 11*  ALT 11 9  ALKPHOS 124 98  BILITOT 0.6 0.6  PROT 7.8 5.9*  ALBUMIN 3.1* 2.3*   No results for input(s): LIPASE, AMYLASE in the last 168 hours. No results for input(s): AMMONIA in the last 168 hours. Coagulation Profile: Recent Labs  Lab 01/22/20 1915  INR 1.2   Cardiac Enzymes: No results for input(s): CKTOTAL, CKMB, CKMBINDEX, TROPONINI in the last 168 hours. BNP (last 3 results) No results for input(s): PROBNP in the last 8760 hours. HbA1C: No results for input(s): HGBA1C in the last 72 hours. CBG: No  results for input(s): GLUCAP in the last 168 hours. Lipid Profile: No results for input(s): CHOL, HDL, LDLCALC, TRIG, CHOLHDL, LDLDIRECT in the last 72 hours. Thyroid Function Tests: No results for input(s): TSH, T4TOTAL, FREET4, T3FREE, THYROIDAB in the last 72 hours. Anemia Panel: Recent Labs    01/27/20 0524  FERRITIN 56  TIBC 224*  IRON 20*   Sepsis Labs: Recent Labs  Lab 01/22/20 1915 01/22/20 2202  LATICACIDVEN 2.7* 2.3*    Recent Results (from the past 240 hour(s))  Aerobic Culture (superficial specimen)     Status: None   Collection Time: 01/22/20  1:50 PM   Specimen: KNEE  Result Value Ref Range Status   Specimen Description   Final    KNEE Performed at [facility], [address] [place], [place] [address]    Special Requests   Final    NONE Performed at [facility], [address] [place], [place] [address]    Gram Stain   Final    RARE WBC PRESENT,BOTH PMN AND MONONUCLEAR NO ORGANISMS SEEN    Culture   Final    RARE NORMAL SKIN FLORA Performed at [facility], [place] [address] [place], [place] [address]    Report Status 01/25/2020 FINAL  Final  Culture, blood (Routine x 2)     Status: None   Collection Time: 01/22/20  7:15 PM   Specimen: BLOOD  Result Value Ref Range Status   Specimen Description BLOOD BLOOD RIGHT HAND  Final   Special Requests   Final    BOTTLES DRAWN  AEROBIC ONLY Blood Culture results may not be optimal due to an inadequate volume of blood received in culture bottles   Culture   Final    NO GROWTH 5 DAYS Performed at [facility], [address] [place], [place] [address]    Report Status 01/27/2020 FINAL  Final  Culture, blood (Routine x 2)     Status: None   Collection Time: 01/22/20 10:02 PM   Specimen: BLOOD  Result Value Ref Range Status   Specimen Description BLOOD RIGHT ANTECUBITAL  Final   Special Requests   Final    BOTTLES DRAWN AEROBIC AND ANAEROBIC Blood Culture adequate  volume   Culture   Final    NO GROWTH 5 DAYS Performed at [facility], [address] [place], [place] [address]    Report Status 01/27/2020 FINAL  Final  Resp Panel by RT-PCR (Flu A&B, Covid) Nasopharyngeal Swab     Status: None   Collection Time: 01/22/20 10:02 PM   Specimen: Nasopharyngeal Swab; Nasopharyngeal(NP) swabs in vial transport medium  Result Value Ref Range Status   SARS Coronavirus 2 by RT PCR NEGATIVE NEGATIVE Final    Comment: (NOTE) SARS-CoV-2 target nucleic acids are NOT DETECTED.  The SARS-CoV-2 RNA is generally detectable in upper respiratory specimens during the acute phase of infection. The lowest concentration of SARS-CoV-2 viral copies this assay can detect is 138 copies/mL. A negative result does not preclude SARS-Cov-2 infection and should not be used as the sole basis for treatment or other patient management decisions. A negative result may occur with  improper specimen collection/handling, submission of specimen other than nasopharyngeal swab, presence of viral mutation(s) within the areas targeted by this assay, and inadequate number of viral copies(<138 copies/mL). A negative result must be combined with clinical observations, patient history, and epidemiological information. The expected result is Negative.  Fact Sheet for Patients:  EntrepreneurPulse.com.au  Fact Sheet for Healthcare Providers:  IncredibleEmployment.be  This test is no t yet approved or cleared by the [place] FDA and  has been authorized for detection and/or diagnosis of SARS-CoV-2 by FDA under an Emergency Use Authorization (EUA). This EUA will remain  in effect (meaning this test can be used) for the duration of the COVID-19 declaration under Section 564(b)(1) of the Act, 21 U.S.C.section 360bbb-3(b)(1), unless the authorization is terminated  or revoked sooner.       Influenza A by PCR NEGATIVE NEGATIVE Final    Influenza B by PCR NEGATIVE NEGATIVE Final    Comment: (NOTE) The Xpert Xpress SARS-CoV-2/FLU/RSV plus assay is intended as an aid in the diagnosis of influenza from Nasopharyngeal swab specimens and should not be used as a sole basis for treatment. Nasal washings and aspirates are unacceptable for Xpert Xpress SARS-CoV-2/FLU/RSV testing.  Fact Sheet for Patients: EntrepreneurPulse.com.au  Fact Sheet for Healthcare Providers: IncredibleEmployment.be  This test is not yet approved or cleared by the [place] FDA and has been authorized for detection and/or diagnosis of SARS-CoV-2 by FDA under an Emergency Use Authorization (EUA). This EUA will remain in effect (meaning this test can be used) for the duration of the COVID-19 declaration under Section 564(b)(1) of the Act, 21 U.S.C. section 360bbb-3(b)(1), unless the authorization is terminated or revoked.  Performed at [facility], [address] [place], [place] [address]   Aerobic Culture (superficial specimen)     Status: None   Collection Time: 01/23/20  7:02 PM   Specimen: Wound  Result Value Ref Range Status  Specimen Description   Final    WOUND Performed at [facility], [address] [place], [place] [address]    Special Requests   Final    NONE Performed at [facility], [address] [place], [place] [address]    Gram Stain   Final    FEW WBC PRESENT, PREDOMINANTLY PMN NO ORGANISMS SEEN Performed at [facility], [place] [address], [place], [place] [address]    Culture RARE STAPHYLOCOCCUS AUREUS  Final   Report Status 01/28/2020 FINAL  Final   Organism ID, Bacteria STAPHYLOCOCCUS AUREUS  Final      Susceptibility   Staphylococcus aureus - MIC*    CIPROFLOXACIN <=0.5 SENSITIVE Sensitive     ERYTHROMYCIN >=8 RESISTANT Resistant     GENTAMICIN <=0.5 SENSITIVE Sensitive     OXACILLIN 0.5 SENSITIVE Sensitive     TETRACYCLINE <=1  SENSITIVE Sensitive     VANCOMYCIN 1 SENSITIVE Sensitive     TRIMETH/SULFA <=10 SENSITIVE Sensitive     CLINDAMYCIN RESISTANT Resistant     RIFAMPIN <=0.5 SENSITIVE Sensitive     Inducible Clindamycin POSITIVE Resistant     * RARE STAPHYLOCOCCUS AUREUS  Anaerobic culture     Status: None (Preliminary result)   Collection Time: 01/24/20 11:43 AM   Specimen: PATH Other; Wound  Result Value Ref Range Status   Specimen Description SYNOVIAL RIGHT KNEE  Final   Special Requests RECEIVED IN STERILE CUP  Final   Gram Stain   Final    ABUNDANT WBC PRESENT,BOTH PMN AND MONONUCLEAR NO ORGANISMS SEEN Performed at [facility], [address], [place], [place] [address]    Culture   Final    NO ANAEROBES ISOLATED; CULTURE IN PROGRESS FOR 5 DAYS   Report Status PENDING  Incomplete  Gram stain     Status: None   Collection Time: 01/24/20 11:43 AM   Specimen: PATH Other; Wound  Result Value Ref Range Status   Specimen Description WOUND  Final   Special Requests NONE  Final   Gram Stain   Final    MODERATE WBC SEEN NO ORGANISMS SEEN RESULT CALLED TO, READ BACK BY AND VERIFIED WITH: Caralee Ates AT 1218 01/24/20 SDR Performed at [facility], [address] [place], [place] [address]    Report Status 01/24/2020 FINAL  Final  Aerobic/Anaerobic Culture (surgical/deep wound)     Status: None (Preliminary result)   Collection Time: 01/24/20 11:43 AM   Specimen: Synovial, Right Knee; Body Fluid  Result Value Ref Range Status   Specimen Description WOUND RIGHT KNEE  Final   Special Requests SUPERFICIAL SWAB  Final   Gram Stain   Final    RARE WBC PRESENT, PREDOMINANTLY MONONUCLEAR RARE GRAM POSITIVE COCCI IN CLUSTERS Performed at [facility], [place] [address], [place], [place] [address]    Culture   Final    RARE STAPHYLOCOCCUS AUREUS NO ANAEROBES ISOLATED; CULTURE IN PROGRESS FOR 5 DAYS    Report Status PENDING  Incomplete   Organism ID, Bacteria STAPHYLOCOCCUS  AUREUS  Final      Susceptibility   Staphylococcus aureus - MIC*    CIPROFLOXACIN <=0.5 SENSITIVE Sensitive     ERYTHROMYCIN >=8 RESISTANT Resistant     GENTAMICIN <=0.5 SENSITIVE Sensitive     OXACILLIN 0.5 SENSITIVE Sensitive     TETRACYCLINE <=1 SENSITIVE Sensitive     VANCOMYCIN 1 SENSITIVE Sensitive     TRIMETH/SULFA <=10 SENSITIVE Sensitive     CLINDAMYCIN  RESISTANT Resistant     RIFAMPIN <=0.5 SENSITIVE Sensitive     Inducible Clindamycin POSITIVE Resistant     * RARE STAPHYLOCOCCUS AUREUS  Body fluid culture     Status: None   Collection Time: 01/24/20 11:43 AM   Specimen: Synovium  Result Value Ref Range Status   Specimen Description SYNOVIAL FLUID RIGHT KNEE  Final   Special Requests RECEIVED IN A CUP  Final   Gram Stain   Final    ABUNDANT WBC PRESENT, PREDOMINANTLY PMN NO ORGANISMS SEEN    Culture   Final    RARE STAPHYLOCOCCUS AUREUS CRITICAL RESULT CALLED TO, READ BACK BY AND VERIFIED WITH: PHARMD L KLUTTZ [id number] AT 903 BY CM Performed at [facility], [place] [address], [place], [place] [address]    Report Status 01/28/2020 FINAL  Final   Organism ID, Bacteria STAPHYLOCOCCUS AUREUS  Final      Susceptibility   Staphylococcus aureus - MIC*    CIPROFLOXACIN <=0.5 SENSITIVE Sensitive     ERYTHROMYCIN >=8 RESISTANT Resistant     GENTAMICIN <=0.5 SENSITIVE Sensitive     OXACILLIN 0.5 SENSITIVE Sensitive     TETRACYCLINE <=1 SENSITIVE Sensitive     VANCOMYCIN 1 SENSITIVE Sensitive     TRIMETH/SULFA <=10 SENSITIVE Sensitive     CLINDAMYCIN RESISTANT Resistant     RIFAMPIN <=0.5 SENSITIVE Sensitive     Inducible Clindamycin POSITIVE Resistant     * RARE STAPHYLOCOCCUS AUREUS  Gram stain     Status: None   Collection Time: 01/24/20 12:15 PM   Specimen: Wound  Result Value Ref Range Status   Specimen Description WOUND  Final   Special Requests C RIGHT KNEE  SUPRA PATELLAR POUCH   Final   Gram Stain   Final    MODERATE WBC SEEN NO ORGANISMS SEEN RESULT  CALLED TO, READ BACK BY AND VERIFIED WITHCaralee Ates AT [id number] 01/24/20 SDR Performed at [facility], [address], [place], [place] [address]    Report Status 01/24/2020 FINAL  Final  Aerobic/Anaerobic Culture (surgical/deep wound)     Status: None (Preliminary result)   Collection Time: 01/24/20 12:15 PM   Specimen: Wound  Result Value Ref Range Status   Specimen Description WOUND RIGHT KNEE  Final   Special Requests SPECIMEN C SUPRA PATELLAR POUCH  Final   Gram Stain   Final    RARE NO WBC SEEN NO ORGANISMS SEEN Performed at [facility], [place] [address], [place], [place] [address]    Culture   Final    RARE STAPHYLOCOCCUS AUREUS CORRECTED RESULTS PREVIOUSLY REPORTED AS: STAPHYLOCOCCUS CAPITIS CORRECTED RESULTS CALLED TO: PHARMD L KLUTTZ [id number] AT [id number] BY CM NO ANAEROBES ISOLATED; CULTURE IN PROGRESS FOR 5 DAYS    Report Status PENDING  Incomplete   Organism ID, Bacteria STAPHYLOCOCCUS AUREUS  Final      Susceptibility   Staphylococcus aureus - MIC*    CIPROFLOXACIN <=0.5 SENSITIVE Sensitive     ERYTHROMYCIN >=8 RESISTANT Resistant     GENTAMICIN <=0.5 SENSITIVE Sensitive     OXACILLIN <=0.25 SENSITIVE Sensitive     TETRACYCLINE <=1 SENSITIVE Sensitive     VANCOMYCIN <=0.5 SENSITIVE Sensitive     TRIMETH/SULFA <=10 SENSITIVE Sensitive     CLINDAMYCIN RESISTANT Resistant     RIFAMPIN <=0.5 SENSITIVE Sensitive     Inducible Clindamycin POSITIVE Resistant     * RARE STAPHYLOCOCCUS AUREUS  Gram stain     Status: None   Collection Time:  01/24/20 12:20 PM   Specimen: Wound  Result Value Ref Range Status   Specimen Description WOUND  Final   Special Requests D RIGHT  KNEE LATERAL GUTTEN  Final   Gram Stain   Final    FEW WBC SEEN NO ORGANISMS SEEN TERESA BENSON AT [id number] 01/24/20 SDR Performed at [facility], [address] [place], [place] [address]    Report Status 01/24/2020 FINAL  Final  Aerobic/Anaerobic Culture (surgical/deep wound)      Status: None (Preliminary result)   Collection Time: 01/24/20 12:20 PM   Specimen: Wound  Result Value Ref Range Status   Specimen Description   Final    WOUND Performed at [facility], [address] [place], [place] [address]    Special Requests  SPECIMEN D RIGHT KNEE LATERAL GUTTERS  Final   Gram Stain   Final    RARE WBC PRESENT,BOTH PMN AND MONONUCLEAR NO ORGANISMS SEEN Performed at [facility], [place] [address] [place], [place] [address]    Culture   Final    RARE STAPHYLOCOCCUS AUREUS NO ANAEROBES ISOLATED; CULTURE IN PROGRESS FOR 5 DAYS    Report Status PENDING  Incomplete   Organism ID, Bacteria STAPHYLOCOCCUS AUREUS  Final      Susceptibility   Staphylococcus aureus - MIC*    CIPROFLOXACIN <=0.5 SENSITIVE Sensitive     ERYTHROMYCIN >=8 RESISTANT Resistant     GENTAMICIN <=0.5 SENSITIVE Sensitive     OXACILLIN 0.5 SENSITIVE Sensitive     TETRACYCLINE <=1 SENSITIVE Sensitive     VANCOMYCIN <=0.5 SENSITIVE Sensitive     TRIMETH/SULFA <=10 SENSITIVE Sensitive     CLINDAMYCIN RESISTANT Resistant     RIFAMPIN <=0.5 SENSITIVE Sensitive     Inducible Clindamycin POSITIVE Resistant     * RARE STAPHYLOCOCCUS AUREUS  Gram stain     Status: None   Collection Time: 01/24/20 12:22 PM   Specimen: Wound  Result Value Ref Range Status   Specimen Description WOUND  Final   Special Requests E RIGHT KNEE MEDIAL GUTTEN  Final   Gram Stain   Final    FEW WBC SEEN NO ORGANISMS SEEN RESULT CALLED TO, READ BACK BY AND VERIFIED WITHCaralee Ates AT [id number] 01/24/20 SDR Performed at [facility], [address], [place], [place] [address]    Report Status 01/24/2020 FINAL  Final  Aerobic/Anaerobic Culture (surgical/deep wound)     Status: None (Preliminary result)   Collection Time: 01/24/20 12:22 PM   Specimen: Wound  Result Value Ref Range Status   Specimen Description   Final    WOUND Performed at [facility], [address],  [place], [place] [address]    Special Requests  [place]  Final   Gram Stain   Final    FEW WBC PRESENT,BOTH PMN AND MONONUCLEAR NO ORGANISMS SEEN Performed at [facility], [place] [address] [place], [place] [address]    Culture   Final    RARE STAPHYLOCOCCUS AUREUS NO ANAEROBES ISOLATED; CULTURE IN PROGRESS FOR 5 DAYS    Report Status PENDING  Incomplete   Organism ID, Bacteria STAPHYLOCOCCUS AUREUS  Final      Susceptibility   Staphylococcus aureus - MIC*    CIPROFLOXACIN <=0.5 SENSITIVE Sensitive     ERYTHROMYCIN >=8 RESISTANT Resistant     GENTAMICIN <=0.5 SENSITIVE Sensitive     OXACILLIN <=0.25 SENSITIVE Sensitive     TETRACYCLINE <=1 SENSITIVE Sensitive  VANCOMYCIN 1 SENSITIVE Sensitive     TRIMETH/SULFA <=10 SENSITIVE Sensitive     CLINDAMYCIN RESISTANT Resistant     RIFAMPIN <=0.5 SENSITIVE Sensitive     Inducible Clindamycin POSITIVE Resistant     * RARE STAPHYLOCOCCUS AUREUS  Gram stain     Status: None   Collection Time: 01/24/20 12:24 PM   Specimen: Wound  Result Value Ref Range Status   Specimen Description WOUND  Final   Special Requests F RIGHT KNEE POSTERIOR RECESS  Final   Gram Stain   Final    MANY WBC SEEN NO ORGANISMS SEEN RESULT CALLED TO, READ BACK BY AND VERIFIED WITHKnute Neu AT 1321 01/24/20 SDR Performed at [facility], [address], [place], [place] [address]    Report Status 01/24/2020 FINAL  Final  Aerobic/Anaerobic Culture (surgical/deep wound)     Status: None (Preliminary result)   Collection Time: 01/24/20 12:24 PM   Specimen: Wound  Result Value Ref Range Status   Specimen Description   Final    WOUND Performed at [facility], [address] [place], [place] [address]    Special Requests  SPECIMENF RIGHT KNEE POSTERIOI RECESS  Final   Gram Stain   Final    RARE WBC PRESENT, PREDOMINANTLY PMN RARE GRAM POSITIVE COCCI IN PAIRS Performed at [facility], [address], [place], [place] [address]    Culture   Final    RARE STAPHYLOCOCCUS AUREUS NO ANAEROBES ISOLATED; CULTURE IN PROGRESS FOR 5 DAYS    Report Status PENDING  Incomplete   Organism ID, Bacteria STAPHYLOCOCCUS AUREUS  Final      Susceptibility   Staphylococcus aureus - MIC*    CIPROFLOXACIN <=0.5 SENSITIVE Sensitive     ERYTHROMYCIN >=8 RESISTANT Resistant     GENTAMICIN <=0.5 SENSITIVE Sensitive     OXACILLIN 0.5 SENSITIVE Sensitive     TETRACYCLINE <=1 SENSITIVE Sensitive     VANCOMYCIN 1 SENSITIVE Sensitive     TRIMETH/SULFA <=10 SENSITIVE Sensitive     CLINDAMYCIN RESISTANT Resistant     RIFAMPIN <=0.5 SENSITIVE Sensitive     Inducible Clindamycin POSITIVE Resistant     * RARE STAPHYLOCOCCUS AUREUS  Urine culture     Status: None   Collection Time: 01/24/20  2:49 PM   Specimen: Urine, Catheterized  Result Value Ref Range Status   Specimen Description   Final    URINE, CATHETERIZED Performed at [facility], [address] [place], [place] [address]    Special Requests   Final    NONE Performed at [facility], [address], [place], [place] [address]    Culture   Final    NO GROWTH Performed at [facility], [address] [place], [place] [address]    Report Status 01/25/2020 FINAL  Final    Radiology Studies: No results found. Scheduled Meds: . acidophilus  1 capsule Oral Daily  . vitamin C  500 mg Oral BID  . calcium-vitamin D  2 tablet Oral Daily  . carvedilol  3.125 mg Oral BID  . enoxaparin (LOVENOX) injection  30 mg Subcutaneous Q12H  . escitalopram  10 mg Oral Daily  . feeding supplement  237 mL Oral BID BM  . ferrous sulfate  325 mg Oral BID WC  . influenza vaccine adjuvanted  0.5 mL Intramuscular Tomorrow-1000  . loratadine  10 mg Oral Daily  . multivitamin with minerals  1 tablet Oral Daily  . multivitamin-lutein  1 capsule Oral BID  . pantoprazole  40 mg Oral BID AC  . rifampin  300 mg Oral Q12H   Continuous  Infusions: .  ceFAZolin (ANCEF) IV    . tranexamic acid    . vancomycin 750 mg (01/28/20 1001)     LOS: 6 days    Time spent: 25 mins    Ashlin Kreps, MD [facility]   If 7PM-7AM, please contact night-coverage

## 2020-01-28 NOTE — Progress Notes (Signed)
PICC placement order for home received, no d/c date. Primary RN made aware PICC line will be placed tomorrow 12/29. Has working PIV, floor RN will place PIV consult if needed.

## 2020-01-28 NOTE — Progress Notes (Signed)
  Subjective: 4 Days Post-Op Procedure(s) (LRB): TOTAL KNEE REVISION WITH SCAR DEBRIDEMENT/PATELLA REVISION WITH POLY EXCHANGE (Right)  Patient finishing with therapy as I enter. Patient reports pain as well-controlled.   Patient has had minor nausea in the AM that has resolved. Negative for chest pain and shortness of breath Fever: no Gastrointestinal: negative for  vomiting.   Patient has had a bowel movement.  Objective: Vital signs in last 24 hours: Temp:  [97.2 F (36.2 C)-99 F (37.2 C)] 97.2 F (36.2 C) (12/28 0700) Pulse Rate:  [82-91] 91 (12/28 0700) Resp:  [16-20] 20 (12/28 0700) BP: (113-134)/(54-66) 119/66 (12/28 0700) SpO2:  [91 %-100 %] 91 % (12/28 0700)  Intake/Output from previous day:  Intake/Output Summary (Last 24 hours) at 01/28/2020 0912 Last data filed at 01/28/2020 0113 Gross per 24 hour  Intake 120 ml  Output --  Net 120 ml    Intake/Output this shift: No intake/output data recorded.  Labs: Recent Labs    01/26/20 0550 01/27/20 0524 01/28/20 0621  HGB 8.5* 8.2* 8.5*   Recent Labs    01/26/20 0550 01/27/20 0524 01/28/20 0621  WBC 10.5  --   --   RBC 3.93  --   --   HCT 28.8* 27.9* 28.2*  PLT 485*  --   --    Recent Labs    01/26/20 0550 01/28/20 0621  NA 136 137  K 3.8 3.7  CL 103 103  CO2 26 27  BUN 11 11  CREATININE 0.66 0.65  GLUCOSE 103* 107*  CALCIUM 10.3 10.2   No results for input(s): LABPT, INR in the last 72 hours.   EXAM General - Patient is Alert, Appropriate and Oriented Extremity - Neurovascular intact Dorsiflexion/Plantar flexion intact Compartment soft Dressing/Incision -Prevena in place to knee and ankle, serosanguinous drainage in canister measured at ~125 mL Motor Function - intact, moving foot and toes well on exam.  Cardiovascular- Regular rate and rhythm, no murmurs/rubs/gallops Respiratory- Lungs clear to auscultation bilaterally Gastrointestinal- soft, nontender and active bowel sounds  L  Knee: no erythema, mild warmth. No signs of infection. No pain with gentle PROM. N/v intact to LLE   Assessment/Plan: 4 Days Post-Op Procedure(s) (LRB): TOTAL KNEE REVISION WITH SCAR DEBRIDEMENT/PATELLA REVISION WITH POLY EXCHANGE (Right) Active Problems:   Abscess of right knee  Estimated body mass index is 28.81 kg/m as calculated from the following:   Height as of this encounter: 5\' 9"  (1.753 m).   Weight as of this encounter: 88.5 kg. Advance diet Up with therapy  Follow-up 2 weeks post-op for staple removal.  DVT Prophylaxis - Lovenox, Ted hose and foot pumps Weight-Bearing as tolerated to right leg with knee immobilizer  , PA-C [facility] 01/28/2020, 9:12 AM

## 2020-01-28 NOTE — Treatment Plan (Signed)
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 Celecoxib Nausea Only  . Hydrocodone-Acetaminophen Nausea And Vomiting  . Meloxicam Nausea Only  . Morphine Sulfate Er Beads Itching  . Naproxen Swelling  . Oxycodone Other (See Comments)    disorientation  . Rofecoxib Nausea Only  . Sulfa Antibiotics Other (See Comments)    Reaction: unknown  . Ciprofloxacin Rash  . Nickel Rash  . Tramadol-Acetaminophen Nausea Only and Rash    Per pt she takes this when PT comes only makes her feel "Loopy" 11/08/2019    OPAT Orders Discharge antibiotics: Cefazolin 2 grams IV  every 8 hrs until : 2/4//22 Rifampin 330m PO BID until 03/06/20  [facility]  Labs weekly on Monday while on IV antibiotics: _X_ CBC with differential _X_ CMP _X_ CRP __X ESR   X__ Please pull PIC at completion of IV antibiotics   Fax weekly labs to [phone] Clinic Follow Up Appt:02/13/20   Call [phone] any questions

## 2020-01-28 NOTE — Progress Notes (Addendum)
MARGURITTE, FLORENTINO ([id number]) Visit Report for 01/22/2020 Arrival Information Details Patient Name: Jasmine Morrow, Jasmine Morrow. Date of Service: 01/22/2020 1:30 PM Medical Record Number: [id number] Patient Account Number: [id number] Date of Birth/Sex: Dec 02, 1945 (74 y.o. F) Treating RN: Dolan Amen Primary Care Allina Riches: Emily Filbert Other Clinician: Referring Fredia Chittenden: Emily Filbert Treating Uno Esau/Extender: Tito Dine in Treatment: 2 Visit Information History Since Last Visit Pain Present Now: Yes Patient Arrived: Wheel Chair Arrival Time: 13:27 Accompanied By: husband Transfer Assistance: EasyPivot Patient Lift Patient Identification Verified: Yes Secondary Verification Process Completed: Yes Electronic Signature(s) Signed: 01/22/2020 5:01:16 PM By: Georges Mouse, Minus Breeding RN Entered By: Georges Mouse, Minus Breeding on 01/22/2020 13:28:39 Jasmine Morrow ([id number]) -------------------------------------------------------------------------------- Clinic Level of Care Assessment Details Patient Name: Jasmine Morrow. Date of Service: 01/22/2020 1:30 PM Medical Record Number: [id number] Patient Account Number: [id number] Date of Birth/Sex: Jun 01, 1945 (74 y.o. F) Treating RN: Cornell Barman Primary Care Vishnu Moeller: Emily Filbert Other Clinician: Referring Cassandra Mcmanaman: Emily Filbert Treating Latoya Maulding/Extender: Tito Dine in Treatment: 2 Clinic Level of Care Assessment Items TOOL 4 Quantity Score []  - Use when only an EandM is performed on FOLLOW-UP visit 0 ASSESSMENTS - Nursing Assessment / Reassessment X - Reassessment of Co-morbidities (includes updates in patient status) 1 10 X- 1 5 Reassessment of Adherence to Treatment Plan ASSESSMENTS - Wound and Skin Assessment / Reassessment []  - Simple Wound Assessment / Reassessment - one wound 0 X- 2 5 Complex Wound Assessment / Reassessment - multiple wounds []  - 0 Dermatologic / Skin Assessment (not related to wound  area) ASSESSMENTS - Focused Assessment []  - Circumferential Edema Measurements - multi extremities 0 []  - 0 Nutritional Assessment / Counseling / Intervention []  - 0 Lower Extremity Assessment (monofilament, tuning fork, pulses) []  - 0 Peripheral Arterial Disease Assessment (using hand held doppler) ASSESSMENTS - Ostomy and/or Continence Assessment and Care []  - Incontinence Assessment and Management 0 []  - 0 Ostomy Care Assessment and Management (repouching, etc.) PROCESS - Coordination of Care X - Simple Patient / Family Education for ongoing care 1 15 []  - 0 Complex (extensive) Patient / Family Education for ongoing care X- 1 10 Staff obtains Consents, Records, Test Results / Process Orders []  - 0 Staff telephones HHA, Nursing Homes / Clarify orders / etc []  - 0 Routine Transfer to another Facility (non-emergent condition) []  - 0 Routine Hospital Admission (non-emergent condition) []  - 0 New Admissions / Biomedical engineer / Ordering NPWT, Apligraf, etc. []  - 0 Emergency Hospital Admission (emergent condition) X- 1 10 Simple Discharge Coordination []  - 0 Complex (extensive) Discharge Coordination PROCESS - Special Needs []  - Pediatric / Minor Patient Management 0 []  - 0 Isolation Patient Management []  - 0 Hearing / Language / Visual special needs []  - 0 Assessment of Community assistance (transportation, D/C planning, etc.) []  - 0 Additional assistance / Altered mentation []  - 0 Support Surface(s) Assessment (bed, cushion, seat, etc.) INTERVENTIONS - Wound Cleansing / Measurement ARLISA, VANNOSTRAND R. ([id number]) []  - 0 Simple Wound Cleansing - one wound X- 2 5 Complex Wound Cleansing - multiple wounds X- 1 5 Wound Imaging (photographs - any number of wounds) []  - 0 Wound Tracing (instead of photographs) []  - 0 Simple Wound Measurement - one wound X- 2 5 Complex Wound Measurement - multiple wounds INTERVENTIONS - Wound Dressings []  - Small Wound  Dressing one or multiple wounds 0 []  - 0 Medium Wound Dressing one or multiple wounds X- 2 20 Large Wound Dressing one or multiple wounds []  -  0 Application of Medications - topical []  - 0 Application of Medications - injection INTERVENTIONS - Miscellaneous []  - External ear exam 0 X- 1 5 Specimen Collection (cultures, biopsies, blood, body fluids, etc.) X- 1 5 Specimen(s) / Culture(s) sent or taken to Lab for analysis X- 1 10 Patient Transfer (multiple staff / Civil Service fast streamer / Similar devices) []  - 0 Simple Staple / Suture removal (25 or less) []  - 0 Complex Staple / Suture removal (26 or more) []  - 0 Hypo / Hyperglycemic Management (close monitor of Blood Glucose) []  - 0 Ankle / Brachial Index (ABI) - do not check if billed separately X- 1 5 Vital Signs Has the patient been seen at the hospital within the last three years: Yes Total Score: 150 Level Of Care: New/Established - Level 4 Electronic Signature(s) Signed: 01/24/2020 7:55:12 AM By: Gretta Cool, BSN, RN, CWS, Kim RN, BSN Entered By: Gretta Cool, BSN, RN, CWS, Kim on 01/22/2020 16:43:18 Jasmine Morrow ([id number]) -------------------------------------------------------------------------------- Encounter Discharge Information Details Patient Name: Jasmine Morrow, Jasmine Morrow. Date of Service: 01/22/2020 1:30 PM Medical Record Number: [id number] Patient Account Number: [id number] Date of Birth/Sex: [birth year] (74 y.o. F) Treating RN: Cornell Barman Primary Care Thorin Starner: Emily Filbert Other Clinician: Referring Mahonri Seiden: Emily Filbert Treating Lillyann Ahart/Extender: Tito Dine in Treatment: 2 Encounter Discharge Information Items Discharge Condition: Stable Ambulatory Status: Wheelchair Discharge Destination: Home Transportation: Private Auto Accompanied By: self Schedule Follow-up Appointment: Yes Clinical Summary of Care: Electronic Signature(s) Signed: 01/22/2020 4:48:27 PM By: Gretta Cool, BSN, RN, CWS, Kim RN, BSN Entered By:  Gretta Cool, BSN, RN, CWS, Kim on 01/22/2020 16:48:26 Jasmine Morrow ([id number]) -------------------------------------------------------------------------------- Lower Extremity Assessment Details Patient Name: Jasmine Morrow, Jasmine Morrow. Date of Service: 01/22/2020 1:30 PM Medical Record Number: [id number] Patient Account Number: [id number] Date of Birth/Sex: 04/02/1945 (74 y.o. F) Treating RN: Dolan Amen Primary Care Hasheem Voland: Emily Filbert Other Clinician: Referring Alvino Lechuga: Emily Filbert Treating Bohdi Leeds/Extender: Tito Dine in Treatment: 2 Edema Assessment Assessed: [Left: No] [Right: Yes] Edema: [Left: Ye] [Right: s] Calf Left: Right: Point of Measurement: 26 cm From Medial Instep 33.5 cm Ankle Left: Right: Point of Measurement: 10 cm From Medial Instep 25.6 cm Vascular Assessment Pulses: Dorsalis Pedis Palpable: [Right:No Yes] Electronic Signature(s) Signed: 01/22/2020 5:01:16 PM By: Georges Mouse, Minus Breeding RN Entered By: Georges Mouse, Minus Breeding on 01/22/2020 13:52:33 Jasmine Morrow ([id number]) -------------------------------------------------------------------------------- Multi Wound Chart Details Patient Name: Jasmine Morrow. Date of Service: 01/22/2020 1:30 PM Medical Record Number: [id number] Patient Account Number: [id number] Date of Birth/Sex: 1946/01/02 (74 y.o. F) Treating RN: Cornell Barman Primary Care Kyl Givler: Emily Filbert Other Clinician: Referring Malaky Tetrault: Emily Filbert Treating Yolette Hastings/Extender: Tito Dine in Treatment: 2 Vital Signs Height(in): 68 Pulse(bpm): 113 Weight(lbs): 200 Blood Pressure(mmHg): 95/59 Body Mass Index(BMI): 30 Temperature(F): 97.6 Respiratory Rate(breaths/min): 18 Photos: [N/A:N/A] Wound Location: Right Lower Leg Right Knee N/A Wounding Event: Surgical Injury Skin Tear/Laceration N/A Primary Etiology: Open Surgical Wound Skin Tear N/A Comorbid History: Hypertension, Osteoarthritis Hypertension,  Osteoarthritis N/A Date Acquired: 02/01/2019 01/18/2020 N/A Weeks of Treatment: 2 0 N/A Wound Status: Open Open N/A Measurements L x W x D (cm) 1x1x0.2 0.5x0.5x0.1 N/A Area (cm) : 0.785 0.196 N/A Volume (cm) : 0.157 0.02 N/A % Reduction in Area: 24.30% 0.00% N/A % Reduction in Volume: 87.40% 0.00% N/A Classification: Full Thickness Without Exposed Full Thickness Without Exposed N/A Support Structures Support Structures Exudate Amount: Large Large N/A Exudate Type: Serosanguineous Purulent N/A Exudate Color: red, brown yellow, brown, green N/A Granulation Amount: Medium (34-66%) Large (67-100%) N/A  Granulation Quality: Red Red N/A Necrotic Amount: Small (1-33%) None Present (0%) N/A Exposed Structures: Fat Layer (Subcutaneous Tissue): Fascia: No N/A Yes Fat Layer (Subcutaneous Tissue): Fascia: No No Tendon: No Tendon: No Muscle: No Muscle: No Joint: No Joint: No Bone: No Bone: No Epithelialization: None None N/A Treatment Notes Electronic Signature(s) Signed: 01/24/2020 7:55:12 AM By: Elliot Gurney, BSN, RN, CWS, Kim RN, BSN Entered By: Elliot Gurney, BSN, RN, CWS, Kim on 01/22/2020 14:37:36 Jasmine Morrow ([id number]) -------------------------------------------------------------------------------- Multi-Disciplinary Care Plan Details Patient Name: Jasmine Morrow, Jasmine Morrow. Date of Service: 01/22/2020 1:30 PM Medical Record Number: [id number] Patient Account Number: [id number] Date of Birth/Sex: 12/28/1945 (74 y.o. F) Treating RN: Huel Coventry Primary Care Alexie Lanni: Bethann Punches Other Clinician: Referring Demeshia Sherburne: Bethann Punches Treating Emerly Prak/Extender: Altamese [address] in Treatment: 2 Active Inactive Electronic Signature(s) Signed: 02/25/2020 4:57:52 PM By: Elliot Gurney, BSN, RN, CWS, Kim RN, BSN Previous Signature: 01/28/2020 2:53:56 PM Version By: Elliot Gurney BSN, RN, CWS, Kim RN, BSN Previous Signature: 01/24/2020 7:55:12 AM Version By: Elliot Gurney, BSN, RN, CWS, Kim RN, BSN Entered By: Elliot Gurney,  BSN, RN, CWS, Kim on 02/25/2020 16:57:52 Jasmine Morrow ([id number]) -------------------------------------------------------------------------------- Pain Assessment Details Patient Name: Jasmine Morrow, Jasmine Morrow. Date of Service: 01/22/2020 1:30 PM Medical Record Number: [id number] Patient Account Number: [id number] Date of Birth/Sex: 1945-05-26 (74 y.o. F) Treating RN: Rogers Blocker Primary Care Foster Frericks: Bethann Punches Other Clinician: Referring Daruis Swaim: Bethann Punches Treating Timothee Gali/Extender: Altamese [place] in Treatment: 2 Active Problems Location of Pain Severity and Description of Pain Patient Has Paino Yes Site Locations Pain Location: Pain in Ulcers Rate the pain. Current Pain Level: 8 Pain Management and Medication Current Pain Management: Electronic Signature(s) Signed: 01/22/2020 5:01:16 PM By: Phillis Haggis, Dondra Prader RN Entered By: Phillis Haggis, Kenia on 01/22/2020 13:31:55 Jasmine Morrow ([id number]) -------------------------------------------------------------------------------- Wound Assessment Details Patient Name: Jasmine Morrow, Jasmine Morrow. Date of Service: 01/22/2020 1:30 PM Medical Record Number: [id number] Patient Account Number: [id number] Date of Birth/Sex: [birth year] (74 y.o. F) Treating RN: Rogers Blocker Primary Care Chung Chagoya: Bethann Punches Other Clinician: Referring Tekeya Geffert: Bethann Punches Treating Genecis Veley/Extender: Altamese [place] in Treatment: 2 Wound Status Wound Number: 1 Primary Etiology: Open Surgical Wound Wound Location: Right Lower Leg Wound Status: Open Wounding Event: Surgical Injury Comorbid History: Hypertension, Osteoarthritis Date Acquired: 02/01/2019 Weeks Of Treatment: 2 Clustered Wound: No Photos Wound Measurements Length: (cm) 1 Width: (cm) 1 Depth: (cm) 0.2 Area: (cm) 0.785 Volume: (cm) 0.157 % Reduction in Area: 24.3% % Reduction in Volume: 87.4% Epithelialization: None Wound Description Classification: Full  Thickness Without Exposed Support Structures Exudate Amount: Large Exudate Type: Serosanguineous Exudate Color: red, brown Foul Odor After Cleansing: No Slough/Fibrino Yes Wound Bed Granulation Amount: Medium (34-66%) Exposed Structure Granulation Quality: Red Fascia Exposed: No Necrotic Amount: Small (1-33%) Fat Layer (Subcutaneous Tissue) Exposed: Yes Necrotic Quality: Adherent Slough Tendon Exposed: No Muscle Exposed: No Joint Exposed: No Bone Exposed: No Electronic Signature(s) Signed: 01/22/2020 5:01:16 PM By: Phillis Haggis, Dondra Prader RN Entered By: Phillis Haggis, Kenia on 01/22/2020 13:49:36 Jasmine Morrow ([id number]) -------------------------------------------------------------------------------- Wound Assessment Details Patient Name: Jasmine Morrow. Date of Service: 01/22/2020 1:30 PM Medical Record Number: [id number] Patient Account Number: [id number] Date of Birth/Sex: 08-19-1945 (74 y.o. F) Treating RN: Rogers Blocker Primary Care Shaquil Aldana: Bethann Punches Other Clinician: Referring Allee Busk: Bethann Punches Treating Merrill Villarruel/Extender: Altamese [place] in Treatment: 2 Wound Status Wound Number: 2 Primary Etiology: Skin Tear Wound Location: Right Knee Wound Status: Open Wounding Event: Skin Tear/Laceration Comorbid History: Hypertension, Osteoarthritis Date Acquired: 01/18/2020 Weeks Of Treatment:  0 Clustered Wound: No Photos Wound Measurements Length: (cm) 0.5 Width: (cm) 0.5 Depth: (cm) 0.1 Area: (cm) 0.196 Volume: (cm) 0.02 % Reduction in Area: 0% % Reduction in Volume: 0% Epithelialization: None Tunneling: No Undermining: No Wound Description Classification: Full Thickness Without Exposed Support Structures Exudate Amount: Large Exudate Type: Purulent Exudate Color: yellow, brown, green Foul Odor After Cleansing: No Slough/Fibrino No Wound Bed Granulation Amount: Large (67-100%) Exposed Structure Granulation Quality: Red Fascia  Exposed: No Necrotic Amount: None Present (0%) Fat Layer (Subcutaneous Tissue) Exposed: No Tendon Exposed: No Muscle Exposed: No Joint Exposed: No Bone Exposed: No Electronic Signature(s) Signed: 01/22/2020 5:01:16 PM By: Georges Mouse, Minus Breeding RN Entered By: Georges Mouse, Kenia on 01/22/2020 13:50:19 Jasmine Morrow ([id number]) -------------------------------------------------------------------------------- Vitals Details Patient Name: Jasmine Morrow. Date of Service: 01/22/2020 1:30 PM Medical Record Number: [id number] Patient Account Number: [id number] Date of Birth/Sex: 1945/04/21 (74 y.o. F) Treating RN: Dolan Amen Primary Care Zareena Willis: Emily Filbert Other Clinician: Referring Mykeria Garman: Emily Filbert Treating Chaunice Obie/Extender: Tito Dine in Treatment: 2 Vital Signs Time Taken: 13:28 Temperature (F): 97.6 Height (in): 68 Pulse (bpm): 113 Weight (lbs): 200 Respiratory Rate (breaths/min): 18 Body Mass Index (BMI): 30.4 Blood Pressure (mmHg): 95/59 Reference Range: 80 - 120 mg / dl Electronic Signature(s) Signed: 01/22/2020 5:01:16 PM By: Georges Mouse, Minus Breeding RN Entered By: Georges Mouse, Minus Breeding on 01/22/2020 13:31:43

## 2020-01-29 ENCOUNTER — Ambulatory Visit: Payer: Medicare Other | Admitting: Internal Medicine

## 2020-01-29 DIAGNOSIS — A4901 Methicillin susceptible Staphylococcus aureus infection, unspecified site: Secondary | ICD-10-CM | POA: Diagnosis not present

## 2020-01-29 DIAGNOSIS — T8459XA Infection and inflammatory reaction due to other internal joint prosthesis, initial encounter: Secondary | ICD-10-CM | POA: Diagnosis not present

## 2020-01-29 DIAGNOSIS — Z96659 Presence of unspecified artificial knee joint: Secondary | ICD-10-CM | POA: Diagnosis not present

## 2020-01-29 DIAGNOSIS — D631 Anemia in chronic kidney disease: Secondary | ICD-10-CM | POA: Diagnosis not present

## 2020-01-29 DIAGNOSIS — L02415 Cutaneous abscess of right lower limb: Secondary | ICD-10-CM | POA: Diagnosis not present

## 2020-01-29 DIAGNOSIS — R7881 Bacteremia: Secondary | ICD-10-CM

## 2020-01-29 LAB — PHOSPHORUS: Phosphorus: 3.6 mg/dL (ref 2.5–4.6)

## 2020-01-29 LAB — CBC
HCT: 27.8 % — ABNORMAL LOW (ref 36.0–46.0)
Hemoglobin: 8.2 g/dL — ABNORMAL LOW (ref 12.0–15.0)
MCH: 21.8 pg — ABNORMAL LOW (ref 26.0–34.0)
MCHC: 29.5 g/dL — ABNORMAL LOW (ref 30.0–36.0)
MCV: 73.7 fL — ABNORMAL LOW (ref 80.0–100.0)
Platelets: 439 10*3/uL — ABNORMAL HIGH (ref 150–400)
RBC: 3.77 MIL/uL — ABNORMAL LOW (ref 3.87–5.11)
RDW: 19.1 % — ABNORMAL HIGH (ref 11.5–15.5)
WBC: 8.7 10*3/uL (ref 4.0–10.5)
nRBC: 0 % (ref 0.0–0.2)

## 2020-01-29 LAB — BASIC METABOLIC PANEL
Anion gap: 10 (ref 5–15)
BUN: 15 mg/dL (ref 8–23)
CO2: 25 mmol/L (ref 22–32)
Calcium: 10.1 mg/dL (ref 8.9–10.3)
Chloride: 102 mmol/L (ref 98–111)
Creatinine, Ser: 0.7 mg/dL (ref 0.44–1.00)
GFR, Estimated: >60 mL/min (ref >60)
Glucose, Bld: 117 mg/dL — ABNORMAL HIGH (ref 70–99)
Potassium: 3.6 mmol/L (ref 3.5–5.1)
Sodium: 137 mmol/L (ref 135–145)

## 2020-01-29 LAB — MAGNESIUM: Magnesium: 2 mg/dL (ref 1.7–2.4)

## 2020-01-29 MED ORDER — SODIUM CHLORIDE 0.9 % IV SOLN
510.0000 mg | Freq: Once | INTRAVENOUS | Status: AC
  Administered 2020-01-29: 510 mg via INTRAVENOUS
  Filled 2020-01-29: qty 17

## 2020-01-29 MED ORDER — SODIUM CHLORIDE 0.9% FLUSH
10.0000 mL | Freq: Two times a day (BID) | INTRAVENOUS | Status: DC
  Administered 2020-01-29 – 2020-01-31 (×4): 10 mL

## 2020-01-29 MED ORDER — CHLORHEXIDINE GLUCONATE CLOTH 2 % EX PADS
6.0000 | MEDICATED_PAD | Freq: Every day | CUTANEOUS | Status: DC
  Administered 2020-01-30 – 2020-01-31 (×2): 6 via TOPICAL

## 2020-01-29 MED ORDER — SODIUM CHLORIDE 0.9% FLUSH
10.0000 mL | INTRAVENOUS | Status: DC | PRN
Start: 2020-01-29 — End: 2020-02-01

## 2020-01-29 NOTE — Progress Notes (Signed)
PHARMACY CONSULT NOTE FOR:  OUTPATIENT  PARENTERAL ANTIBIOTIC THERAPY (OPAT)  Indication: MSSA R knee PJI Regimen: Cefazolin 2gm IV q8h End date: 03/06/2020  Patient to also receive rifampin 300mg  PO BID until 03/06/2020  IV antibiotic discharge orders are pended. To discharging provider:  please sign these orders via discharge navigator,  Select New Orders & click on the button choice - Manage This Unsigned Work.     Thank you for allowing pharmacy to be a part of this patient's care.  05/04/2020, PharmD, BCPS.   Work Cell: [phone] 01/29/2020 9:13 AM

## 2020-01-29 NOTE — Progress Notes (Signed)
Peripherally Inserted Central Catheter Placement  The IV Nurse has discussed with the patient and/or persons authorized to consent for the patient, the purpose of this procedure and the potential benefits and risks involved with this procedure.  The benefits include less needle sticks, lab draws from the catheter, and the patient may be discharged home with the catheter. Risks include, but not limited to, infection, bleeding, blood clot (thrombus formation), and puncture of an artery; nerve damage and irregular heartbeat and possibility to perform a PICC exchange if needed/ordered by physician.  Alternatives to this procedure were also discussed.  Bard Power PICC patient education guide, fact sheet on infection prevention and patient information card has been provided to patient /or left at bedside.    PICC Placement Documentation  PICC Single Lumen 01/29/20 PICC Right Brachial 41 cm 0 cm (Active)  Indication for Insertion or Continuance of Line Home intravenous therapies (PICC only) 01/29/20 1530  Exposed Catheter (cm) 0 cm 01/29/20 1530  Site Assessment Clean;Dry;Intact 01/29/20 1530  Line Status Flushed;Saline locked;Blood return noted 01/29/20 1530  Dressing Type Transparent;Securing device 01/29/20 1530  Dressing Status Clean;Dry;Intact 01/29/20 1530  Antimicrobial disc in place? Yes 01/29/20 1530  Safety Lock Not Applicable 01/29/20 1530  Dressing Intervention New dressing 01/29/20 1530  Dressing Change Due 02/05/20 01/29/20 1530       Jasmine Morrow 01/29/2020, 3:54 PM

## 2020-01-29 NOTE — Progress Notes (Signed)
Physical Therapy Treatment Patient Details Name: Jasmine Morrow MRN: [id number] DOB: Dec 18, 1945 Today's Date: 01/29/2020    History of Present Illness Pt is a 74 y/o F who was admitted on 01/22/20 after presenting to ED with R ankle swelling, pain, & draining wound which has developed over the last 5 days. Pt admitted for sepsis 2/2 wound infection. 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 PMH: asthma, HTN, OSA, GERD, anxiety.    PT Comments    Pt/spouse very upset upon arriving to room. " They were trying to DC [place] today!" Pt's spouse very upset and required a lot of education. Pt agrees to session and has episodes of tearfulness during session. "feeling overwhelmed." Therapist educated pt on why PICC line needs to be placed, how to properly apply knee immobilizer, how wound vac/polar care works and what needs to be performed at home daily. Pt's spouse much more calm and confident about up coming DC by end of session. Spouse curious about EMS transport to home. Patient was able to exit bed and stand to RW however did not want to ambulate this session. Pt was re-educated not to perform ROM on RLE. She has limited AROM on LLE which impacts her ability to stand. Pt's son called during session and states she will have sufficient assistance at home at DC. Will benefit form continued skilled PT at DC to address deficits and improve independence with ADLs.     Follow Up Recommendations  Home health PT;Supervision for mobility/OOB;Supervision/Assistance - 24 hour;Other (comment) (pt unwilling to go to SNF)     Equipment Recommendations  None recommended by PT    Recommendations for Other Services       Precautions / Restrictions Precautions Precautions: Fall Precaution Comments: No ROM to R Knee Required Braces or Orthoses: Knee Immobilizer - Right Knee Immobilizer - Right: On when out of bed or walking Restrictions Weight Bearing  Restrictions: Yes RLE Weight Bearing: Weight bearing as tolerated    Mobility  Bed Mobility Overal bed mobility: Needs Assistance Bed Mobility: Supine to Sit;Sit to Supine     Supine to sit: Supervision;HOB elevated Sit to supine: Supervision      Transfers Overall transfer level: Needs assistance Equipment used: Rolling walker (2 wheeled) Transfers: Sit to/from Stand Sit to Stand: Mod assist;From elevated surface         General transfer comment: Pt has poor knee flexon which impacts her ability to stand without mod assist. Bed height elevated to achieve.  Ambulation/Gait    General Gait Details: pt requested not to ambulate today.       Balance Overall balance assessment: Needs assistance Sitting-balance support: No upper extremity supported;Feet supported Sitting balance-Leahy Scale: Good Sitting balance - Comments: no LOB in sitting   Standing balance support: Bilateral upper extremity supported Standing balance-Leahy Scale: Fair Standing balance comment: reliant on UE support to maintain balance however no LOB noted during standing activity         Cognition Arousal/Alertness: Awake/alert Behavior During Therapy: WFL for tasks assessed/performed Overall Cognitive Status: Within Functional Limits for tasks assessed          General Comments: pt is A and O x 4. Supportive spouse in room and very upset that pt was going to be DC this date.         General Comments General comments (skin integrity, edema, etc.): Extensive education to pt/pt's spouse about expectations for home and how to apply knee immobilizer, polar care,  safety with mobility, use og gait belt, ect      Pertinent Vitals/Pain Pain Assessment: No/denies pain Pain Score: 0-No pain           PT Goals (current goals can now be found in the care plan section) Acute Rehab PT Goals Patient Stated Goal: to go home Progress towards PT goals: Progressing toward goals    Frequency     BID      PT Plan Current plan remains appropriate       AM-PAC PT "6 Clicks" Mobility   Outcome Measure  Help needed turning from your back to your side while in a flat bed without using bedrails?: A Little Help needed moving from lying on your back to sitting on the side of a flat bed without using bedrails?: A Little Help needed moving to and from a bed to a chair (including a wheelchair)?: A Little Help needed standing up from a chair using your arms (e.g., wheelchair or bedside chair)?: A Lot Help needed to walk in hospital room?: A Little Help needed climbing 3-5 steps with a railing? : A Lot 6 Click Score: 16    End of Session Equipment Utilized During Treatment: Right knee immobilizer;Gait belt Activity Tolerance: Patient tolerated treatment well;Patient limited by fatigue Patient left: in bed;with call bell/phone within reach;with bed alarm set Nurse Communication: Mobility status;Precautions PT Visit Diagnosis: Muscle weakness (generalized) (M62.81);Difficulty in walking, not elsewhere classified (R26.2);Pain Pain - Right/Left: Right Pain - part of body: Leg     Time: [id number] PT Time Calculation (min) (ACUTE ONLY): 65 min  Charges:  $Therapeutic Activity: 53-67 mins                     Julaine Fusi PTA 01/29/20, 1:32 PM

## 2020-01-29 NOTE — Progress Notes (Signed)
PROGRESS NOTE    Jasmine Morrow  JOI:[id number] DOB: 23-Apr-1945 DOA: 01/22/2020 PCP: Danella Penton, MD   Chief Complain: Abscess of right knee  Brief Narrative: 74 years old female with PMH of asthma, hypertension, obstructive sleep apnea, GERD, anxiety presents to the emergency department with acute onset of right ankle swelling and pain with draining wound which has developed over the last 5 days.  Patient was seen in the wound care clinic and was sent to the emergency department after she was found to have significant drainage from her wound.  Patient reports that she has complication from right ankle surgery and hardware infection and underwent several debridement surgeries in this year. Patient was admitted for sepsis secondary to wound infection.  Patient was started on ceftriaxone and vancomycin,  infectious disease and orthopedics consulted. She is found to have anemia , has received 1 unit PRBC. Hb 8.8. 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 ID were following her here.  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  ID recommended to continue cefazolin until 03/06/2020 along with rifampin.  The plan is to put a PICC line.  Plan is to discharge her home with home health after orthopedics clearance.  Patient want to talk to orthopedics before putting a PICC line or before discharge.  Assessment & Plan:   Active Problems:   Abscess of right knee   Sepsis secondary to right knee abscess with right knee effusion: Patient initially presented with tachycardia tachypnea, hypotension.  He had lactic acid of 2.7.  He was started on broad-spectrum antibiotics with vancomycin and ceftriaxone.  Blood culture did not show any growth.  Wound culture showed MSSA.  ID were following.  Currently he is on cefazolin and rifampin.  ID recommended to continue antibiotics till 03/06/2020. Plan is to put the PICC  line today for home IV antibiotics.  Status post total knee replacement: Orthopedics are following.  She underwent total knee revision with scar debridement/vaginal revision with polyexchange.  PT/OT recommended home health on discharge.  Hypertension: Currently blood pressure stable.  Continue Coreg  Depression: On Lexapro  GERD: Continue Protonix  Asthma: Currently stable.  Continue bronchodilators as needed.  Microcytic anemia: Most likely secondary to acute on chronic blood loss from surgery.  Hemoglobin in the range of 8.  She was transfused with 1 unit of PRBC here.  Iron studies showed low iron.  She was given a dose of iron infusion.  She needs to be on oral iron supplementation on discharge.         DVT prophylaxis:Lovenox Code Status: Full Family Communication:: Husband at bedside  Status is: Inpatient  Remains inpatient appropriate because:Inpatient level of care appropriate due to severity of illness   Dispo: The patient is from: Home              Anticipated d/c is to: Home              Anticipated d/c date is: 1 day              Patient currently is medically stable to d/c.     Consultants: Ortho,ID  Procedures:As above  Antimicrobials:  Anti-infectives (From admission, onward)   Start     Dose/Rate Route Frequency Ordered Stop   01/28/20 2200  ceFAZolin (ANCEF) IVPB 2g/100 mL premix        2 g 200 mL/hr over 30 Minutes Intravenous Every  8 hours 01/28/20 1424     01/28/20 2200  rifampin (RIFADIN) capsule 300 mg        300 mg Oral Every 12 hours 01/28/20 1424 03/06/20 2359   01/24/20 1235  vancomycin (VANCOCIN) powder  Status:  Discontinued          As needed 01/24/20 1235 01/24/20 1434   01/24/20 1234  gentamicin (GARAMYCIN) injection  Status:  Discontinued          As needed 01/24/20 1234 01/24/20 1434   01/23/20 2200  cefTRIAXone (ROCEPHIN) 2 g in sodium chloride 0.9 % 100 mL IVPB  Status:  Discontinued        2 g 200 mL/hr over 30 Minutes  Intravenous Every 24 hours 01/23/20 0935 01/27/20 1113   01/23/20 2200  vancomycin (VANCOREADY) IVPB 750 mg/150 mL  Status:  Discontinued        750 mg 150 mL/hr over 60 Minutes Intravenous Every 12 hours 01/23/20 0959 01/28/20 1542   01/23/20 0800  vancomycin (VANCOCIN) IVPB 1000 mg/200 mL premix  Status:  Discontinued        1,000 mg 200 mL/hr over 60 Minutes Intravenous Every 12 hours 01/23/20 0331 01/23/20 0959   01/22/20 2230  vancomycin (VANCOCIN) IVPB 1000 mg/200 mL premix  Status:  Discontinued        1,000 mg 200 mL/hr over 60 Minutes Intravenous  Once 01/22/20 2220 01/23/20 0033   01/22/20 2230  ceFAZolin (ANCEF) 2 g in dextrose 5 % 100 mL IVPB  Status:  Discontinued        2 g 240 mL/hr over 30 Minutes Intravenous Every 8 hours 01/22/20 2220 01/23/20 0935   01/22/20 2145  vancomycin (VANCOCIN) IVPB 1000 mg/200 mL premix        1,000 mg 200 mL/hr over 60 Minutes Intravenous  Once 01/22/20 2139 01/22/20 2348   01/22/20 2145  cefTRIAXone (ROCEPHIN) 2 g in sodium chloride 0.9 % 100 mL IVPB        2 g 200 mL/hr over 30 Minutes Intravenous  Once 01/22/20 2139 01/22/20 2229      Subjective: Patient seen and examined at the bedside this morning.  Hemodynamically stable.  Comfortable.  Denies any new complaints today.  She was surprised about the plan of discharge to home.  She wanted to talk to her orthopedics physician first before deciding anything.  Objective: Vitals:   01/28/20 2009 01/28/20 2347 01/29/20 0531 01/29/20 0958  BP: (!) 104/50 (!) 123/47 (!) 124/51 127/65  Pulse: 77 73 97 90  Resp: 18 18 20 20   Temp: 98.4 F (36.9 C) 98.5 F (36.9 C) 98.6 F (37 C) 98.7 F (37.1 C)  TempSrc: Oral  Oral Oral  SpO2: 99% 100% 97% 98%  Weight:      Height:        Intake/Output Summary (Last 24 hours) at 01/29/2020 1052 Last data filed at 01/29/2020 0610 Gross per 24 hour  Intake 0 ml  Output --  Net 0 ml   Filed Weights   01/23/20 0210  Weight: 88.5 kg     Examination:  General exam: Comfortable HEENT:PERRL,Oral mucosa moist, Ear/Nose normal on gross exam Respiratory system: Bilateral equal air entry, normal vesicular breath sounds, no wheezes or crackles  Cardiovascular system: S1 & S2 heard, RRR. No JVD, murmurs, rubs, gallops or clicks. No pedal edema. Gastrointestinal system: Abdomen is nondistended, soft and nontender. No organomegaly or masses felt. Normal bowel sounds heard. Central nervous system: Alert and oriented. No focal  neurological deficits. Extremities: No edema, no clubbing ,no cyanosis, right knee wrapped with dressings, immobiliser  skin: No rashes, lesions or ulcers,no icterus ,no pallor   Data Reviewed: I have personally reviewed following labs and imaging studies  CBC: Recent Labs  Lab 01/22/20 1915 01/23/20 0540 01/23/20 0824 01/25/20 0450 01/26/20 0550 01/27/20 0524 01/28/20 0621 01/29/20 0530  WBC 15.5* 9.9  --  11.7* 10.5  --   --  8.7  NEUTROABS 12.8*  --   --   --   --   --   --   --   HGB 10.0* 7.7*   < > 8.3* 8.5* 8.2* 8.5* 8.2*  HCT 33.2* 25.1*   < > 27.8* 28.8* 27.9* 28.2* 27.8*  MCV 70.2* 69.7*  --  72.6* 73.3*  --   --  73.7*  PLT 797* 668*  --  492* 485*  --   --  439*   < > = values in this interval not displayed.   Basic Metabolic Panel: Recent Labs  Lab 01/24/20 0538 01/25/20 0450 01/26/20 0550 01/28/20 0621 01/29/20 0530  NA 142 139 136 137 137  K 3.9 4.2 3.8 3.7 3.6  CL 110 106 103 103 102  CO2 25 26 26 27 25   GLUCOSE 94 115* 103* 107* 117*  BUN 9 10 11 11 15   CREATININE 0.71 0.80 0.66 0.65 0.70  CALCIUM 9.6 9.7 10.3 10.2 10.1  MG 1.9 1.8 2.1 2.0 2.0  PHOS 3.8 3.3 2.9 3.2 3.6   GFR: Estimated Creatinine Clearance: 74.3 mL/min (by C-G formula based on SCr of 0.7 mg/dL). Liver Function Tests: Recent Labs  Lab 01/22/20 1915 01/28/20 0621  AST 20 11*  ALT 11 9  ALKPHOS 124 98  BILITOT 0.6 0.6  PROT 7.8 5.9*  ALBUMIN 3.1* 2.3*   No results for input(s): LIPASE,  AMYLASE in the last 168 hours. No results for input(s): AMMONIA in the last 168 hours. Coagulation Profile: Recent Labs  Lab 01/22/20 1915  INR 1.2   Cardiac Enzymes: No results for input(s): CKTOTAL, CKMB, CKMBINDEX, TROPONINI in the last 168 hours. BNP (last 3 results) No results for input(s): PROBNP in the last 8760 hours. HbA1C: No results for input(s): HGBA1C in the last 72 hours. CBG: No results for input(s): GLUCAP in the last 168 hours. Lipid Profile: No results for input(s): CHOL, HDL, LDLCALC, TRIG, CHOLHDL, LDLDIRECT in the last 72 hours. Thyroid Function Tests: No results for input(s): TSH, T4TOTAL, FREET4, T3FREE, THYROIDAB in the last 72 hours. Anemia Panel: Recent Labs    01/27/20 0524  FERRITIN 56  TIBC 224*  IRON 20*   Sepsis Labs: Recent Labs  Lab 01/22/20 1915 01/22/20 2202  LATICACIDVEN 2.7* 2.3*    Recent Results (from the past 240 hour(s))  Aerobic Culture (superficial specimen)     Status: None   Collection Time: 01/22/20  1:50 PM   Specimen: KNEE  Result Value Ref Range Status   Specimen Description   Final    KNEE Performed at [facility], [address] [place], [place] [address]    Special Requests   Final    NONE Performed at [facility], [address] [place], [place] [address]    Gram Stain   Final    RARE WBC PRESENT,BOTH PMN AND MONONUCLEAR NO ORGANISMS SEEN    Culture   Final    RARE NORMAL SKIN FLORA Performed at [facility], [place] [address] [place], [place] [address]  Report Status 01/25/2020 FINAL  Final  Culture, blood (Routine x 2)     Status: None   Collection Time: 01/22/20  7:15 PM   Specimen: BLOOD  Result Value Ref Range Status   Specimen Description BLOOD BLOOD RIGHT HAND  Final   Special Requests   Final    BOTTLES DRAWN AEROBIC ONLY Blood Culture results may not be optimal due to an inadequate volume of blood received in culture bottles   Culture   Final    NO GROWTH 5  DAYS Performed at [facility], [address], [place], [place] [address]    Report Status 01/27/2020 FINAL  Final  Culture, blood (Routine x 2)     Status: None   Collection Time: 01/22/20 10:02 PM   Specimen: BLOOD  Result Value Ref Range Status   Specimen Description BLOOD RIGHT ANTECUBITAL  Final   Special Requests   Final    BOTTLES DRAWN AEROBIC AND ANAEROBIC Blood Culture adequate volume   Culture   Final    NO GROWTH 5 DAYS Performed at [facility], [address] [place], [place] [address]    Report Status 01/27/2020 FINAL  Final  Resp Panel by RT-PCR (Flu A&B, Covid) Nasopharyngeal Swab     Status: None   Collection Time: 01/22/20 10:02 PM   Specimen: Nasopharyngeal Swab; Nasopharyngeal(NP) swabs in vial transport medium  Result Value Ref Range Status   SARS Coronavirus 2 by RT PCR NEGATIVE NEGATIVE Final    Comment: (NOTE) SARS-CoV-2 target nucleic acids are NOT DETECTED.  The SARS-CoV-2 RNA is generally detectable in upper respiratory specimens during the acute phase of infection. The lowest concentration of SARS-CoV-2 viral copies this assay can detect is 138 copies/mL. A negative result does not preclude SARS-Cov-2 infection and should not be used as the sole basis for treatment or other patient management decisions. A negative result may occur with  improper specimen collection/handling, submission of specimen other than nasopharyngeal swab, presence of viral mutation(s) within the areas targeted by this assay, and inadequate number of viral copies(<138 copies/mL). A negative result must be combined with clinical observations, patient history, and epidemiological information. The expected result is Negative.  Fact Sheet for Patients:  EntrepreneurPulse.com.au  Fact Sheet for Healthcare Providers:  IncredibleEmployment.be  This test is no t yet approved or cleared by the [place] FDA and  has been  authorized for detection and/or diagnosis of SARS-CoV-2 by FDA under an Emergency Use Authorization (EUA). This EUA will remain  in effect (meaning this test can be used) for the duration of the COVID-19 declaration under Section 564(b)(1) of the Act, 21 U.S.C.section 360bbb-3(b)(1), unless the authorization is terminated  or revoked sooner.       Influenza A by PCR NEGATIVE NEGATIVE Final   Influenza B by PCR NEGATIVE NEGATIVE Final    Comment: (NOTE) The Xpert Xpress SARS-CoV-2/FLU/RSV plus assay is intended as an aid in the diagnosis of influenza from Nasopharyngeal swab specimens and should not be used as a sole basis for treatment. Nasal washings and aspirates are unacceptable for Xpert Xpress SARS-CoV-2/FLU/RSV testing.  Fact Sheet for Patients: EntrepreneurPulse.com.au  Fact Sheet for Healthcare Providers: IncredibleEmployment.be  This test is not yet approved or cleared by the [place] FDA and has been authorized for detection and/or diagnosis of SARS-CoV-2 by FDA under an Emergency Use Authorization (EUA). This EUA will remain in effect (meaning this test can be used) for the duration of the COVID-19 declaration under Section 564(b)(1)  of the Act, 21 U.S.C. section 360bbb-3(b)(1), unless the authorization is terminated or revoked.  Performed at [facility], [address], [place], [place] [address]   Aerobic Culture (superficial specimen)     Status: None   Collection Time: 01/23/20  7:02 PM   Specimen: Wound  Result Value Ref Range Status   Specimen Description   Final    WOUND Performed at [facility], [address] [place], [place] [address]    Special Requests   Final    NONE Performed at [facility], [address] [place], [place] [address]    Gram Stain   Final    FEW WBC PRESENT, PREDOMINANTLY PMN NO ORGANISMS SEEN Performed at [facility], [place] [address],  [place], [place] [address]    Culture RARE STAPHYLOCOCCUS AUREUS  Final   Report Status 01/28/2020 FINAL  Final   Organism ID, Bacteria STAPHYLOCOCCUS AUREUS  Final      Susceptibility   Staphylococcus aureus - MIC*    CIPROFLOXACIN <=0.5 SENSITIVE Sensitive     ERYTHROMYCIN >=8 RESISTANT Resistant     GENTAMICIN <=0.5 SENSITIVE Sensitive     OXACILLIN 0.5 SENSITIVE Sensitive     TETRACYCLINE <=1 SENSITIVE Sensitive     VANCOMYCIN 1 SENSITIVE Sensitive     TRIMETH/SULFA <=10 SENSITIVE Sensitive     CLINDAMYCIN RESISTANT Resistant     RIFAMPIN <=0.5 SENSITIVE Sensitive     Inducible Clindamycin POSITIVE Resistant     * RARE STAPHYLOCOCCUS AUREUS  Anaerobic culture     Status: None (Preliminary result)   Collection Time: 01/24/20 11:43 AM   Specimen: PATH Other; Wound  Result Value Ref Range Status   Specimen Description SYNOVIAL RIGHT KNEE  Final   Special Requests RECEIVED IN STERILE CUP  Final   Gram Stain   Final    ABUNDANT WBC PRESENT,BOTH PMN AND MONONUCLEAR NO ORGANISMS SEEN Performed at [facility], [address], [place], [place] [address]    Culture   Final    NO ANAEROBES ISOLATED; CULTURE IN PROGRESS FOR 5 DAYS   Report Status PENDING  Incomplete  Gram stain     Status: None   Collection Time: 01/24/20 11:43 AM   Specimen: PATH Other; Wound  Result Value Ref Range Status   Specimen Description WOUND  Final   Special Requests NONE  Final   Gram Stain   Final    MODERATE WBC SEEN NO ORGANISMS SEEN RESULT CALLED TO, READ BACK BY AND VERIFIED WITH: Caralee Ates AT 1218 01/24/20 SDR Performed at [facility], [address] [place], [place] [address]    Report Status 01/24/2020 FINAL  Final  Aerobic/Anaerobic Culture (surgical/deep wound)     Status: None (Preliminary result)   Collection Time: 01/24/20 11:43 AM   Specimen: Synovial, Right Knee; Body Fluid  Result Value Ref Range Status   Specimen Description WOUND RIGHT KNEE  Final   Special  Requests SUPERFICIAL SWAB  Final   Gram Stain   Final    RARE WBC PRESENT, PREDOMINANTLY MONONUCLEAR RARE GRAM POSITIVE COCCI IN CLUSTERS Performed at [facility], [place] [address] [place], [place] [address]    Culture   Final    RARE STAPHYLOCOCCUS AUREUS NO ANAEROBES ISOLATED; CULTURE IN PROGRESS FOR 5 DAYS    Report Status PENDING  Incomplete   Organism ID, Bacteria STAPHYLOCOCCUS AUREUS  Final      Susceptibility   Staphylococcus aureus - MIC*  CIPROFLOXACIN <=0.5 SENSITIVE Sensitive     ERYTHROMYCIN >=8 RESISTANT Resistant     GENTAMICIN <=0.5 SENSITIVE Sensitive     OXACILLIN 0.5 SENSITIVE Sensitive     TETRACYCLINE <=1 SENSITIVE Sensitive     VANCOMYCIN 1 SENSITIVE Sensitive     TRIMETH/SULFA <=10 SENSITIVE Sensitive     CLINDAMYCIN RESISTANT Resistant     RIFAMPIN <=0.5 SENSITIVE Sensitive     Inducible Clindamycin POSITIVE Resistant     * RARE STAPHYLOCOCCUS AUREUS  Body fluid culture     Status: None   Collection Time: 01/24/20 11:43 AM   Specimen: Synovium  Result Value Ref Range Status   Specimen Description SYNOVIAL FLUID RIGHT KNEE  Final   Special Requests RECEIVED IN A CUP  Final   Gram Stain   Final    ABUNDANT WBC PRESENT, PREDOMINANTLY PMN NO ORGANISMS SEEN    Culture   Final    RARE STAPHYLOCOCCUS AUREUS CRITICAL RESULT CALLED TO, READ BACK BY AND VERIFIED WITH: PHARMD L KLUTTZ [id number] AT 903 BY CM Performed at [facility], [place] [address] [place], [place] [address]    Report Status 01/28/2020 FINAL  Final   Organism ID, Bacteria STAPHYLOCOCCUS AUREUS  Final      Susceptibility   Staphylococcus aureus - MIC*    CIPROFLOXACIN <=0.5 SENSITIVE Sensitive     ERYTHROMYCIN >=8 RESISTANT Resistant     GENTAMICIN <=0.5 SENSITIVE Sensitive     OXACILLIN 0.5 SENSITIVE Sensitive     TETRACYCLINE <=1 SENSITIVE Sensitive     VANCOMYCIN 1 SENSITIVE Sensitive     TRIMETH/SULFA <=10 SENSITIVE Sensitive     CLINDAMYCIN RESISTANT Resistant      RIFAMPIN <=0.5 SENSITIVE Sensitive     Inducible Clindamycin POSITIVE Resistant     * RARE STAPHYLOCOCCUS AUREUS  Acid Fast Smear (AFB)     Status: None   Collection Time: 01/24/20 11:48 AM   Specimen: Synovial, Right Knee; Body Fluid  Result Value Ref Range Status   AFB Specimen Processing Concentration  Final   Acid Fast Smear Negative  Final    Comment: (NOTE) Performed At: [facility] [place], [place] [id number] Rush Farmer MD [id number]    Source (AFB) WOUND  Final    Comment: Performed at [facility], [address] [place], [place] [address]  Gram stain     Status: None   Collection Time: 01/24/20 12:15 PM   Specimen: Wound  Result Value Ref Range Status   Specimen Description WOUND  Final   Special Requests C RIGHT KNEE  SUPRA PATELLAR POUCH   Final   Gram Stain   Final    MODERATE WBC SEEN NO ORGANISMS SEEN RESULT CALLED TO, READ BACK BY AND VERIFIED WITHCaralee Ates AT [id number] 01/24/20 SDR Performed at [facility], [address] [place], [place] [address]    Report Status 01/24/2020 FINAL  Final  Aerobic/Anaerobic Culture (surgical/deep wound)     Status: None (Preliminary result)   Collection Time: 01/24/20 12:15 PM   Specimen: Wound  Result Value Ref Range Status   Specimen Description WOUND RIGHT KNEE  Final   Special Requests SPECIMEN C SUPRA PATELLAR POUCH  Final   Gram Stain   Final    RARE NO WBC SEEN NO ORGANISMS SEEN Performed at [facility], [place] [address], [place],  [address]    Culture   Final    RARE STAPHYLOCOCCUS AUREUS CORRECTED RESULTS PREVIOUSLY REPORTED AS: STAPHYLOCOCCUS CAPITIS  CORRECTED RESULTS CALLED TO: PHARMD L KLUTTZ [id number] AT [id number] BY CM NO ANAEROBES ISOLATED; CULTURE IN PROGRESS FOR 5 DAYS    Report Status PENDING  Incomplete   Organism ID, Bacteria STAPHYLOCOCCUS AUREUS  Final      Susceptibility   Staphylococcus aureus - MIC*    CIPROFLOXACIN <=0.5 SENSITIVE  Sensitive     ERYTHROMYCIN >=8 RESISTANT Resistant     GENTAMICIN <=0.5 SENSITIVE Sensitive     OXACILLIN <=0.25 SENSITIVE Sensitive     TETRACYCLINE <=1 SENSITIVE Sensitive     VANCOMYCIN <=0.5 SENSITIVE Sensitive     TRIMETH/SULFA <=10 SENSITIVE Sensitive     CLINDAMYCIN RESISTANT Resistant     RIFAMPIN <=0.5 SENSITIVE Sensitive     Inducible Clindamycin POSITIVE Resistant     * RARE STAPHYLOCOCCUS AUREUS  Gram stain     Status: None   Collection Time: 01/24/20 12:20 PM   Specimen: Wound  Result Value Ref Range Status   Specimen Description WOUND  Final   Special Requests D RIGHT  KNEE LATERAL GUTTEN  Final   Gram Stain   Final    FEW WBC SEEN NO ORGANISMS SEEN TERESA BENSON AT [id number] 01/24/20 SDR Performed at [facility], [address] [place], [place] [address]    Report Status 01/24/2020 FINAL  Final  Aerobic/Anaerobic Culture (surgical/deep wound)     Status: None (Preliminary result)   Collection Time: 01/24/20 12:20 PM   Specimen: Wound  Result Value Ref Range Status   Specimen Description   Final    WOUND Performed at [facility], [address], [place], [place] [address]    Special Requests  SPECIMEN D RIGHT KNEE LATERAL GUTTERS  Final   Gram Stain   Final    RARE WBC PRESENT,BOTH PMN AND MONONUCLEAR NO ORGANISMS SEEN Performed at [facility], [place] [address] [place], [place] [address]    Culture   Final    RARE STAPHYLOCOCCUS AUREUS NO ANAEROBES ISOLATED; CULTURE IN PROGRESS FOR 5 DAYS    Report Status PENDING  Incomplete   Organism ID, Bacteria STAPHYLOCOCCUS AUREUS  Final      Susceptibility   Staphylococcus aureus - MIC*    CIPROFLOXACIN <=0.5 SENSITIVE Sensitive     ERYTHROMYCIN >=8 RESISTANT Resistant     GENTAMICIN <=0.5 SENSITIVE Sensitive     OXACILLIN 0.5 SENSITIVE Sensitive     TETRACYCLINE <=1 SENSITIVE Sensitive     VANCOMYCIN <=0.5 SENSITIVE Sensitive     TRIMETH/SULFA <=10 SENSITIVE Sensitive     CLINDAMYCIN  RESISTANT Resistant     RIFAMPIN <=0.5 SENSITIVE Sensitive     Inducible Clindamycin POSITIVE Resistant     * RARE STAPHYLOCOCCUS AUREUS  Gram stain     Status: None   Collection Time: 01/24/20 12:22 PM   Specimen: Wound  Result Value Ref Range Status   Specimen Description WOUND  Final   Special Requests E RIGHT KNEE MEDIAL GUTTEN  Final   Gram Stain   Final    FEW WBC SEEN NO ORGANISMS SEEN RESULT CALLED TO, READ BACK BY AND VERIFIED WITHCaralee Ates AT [id number] 01/24/20 SDR Performed at [facility], [address], [place], [place] [address]    Report Status 01/24/2020 FINAL  Final  Aerobic/Anaerobic Culture (surgical/deep wound)     Status: None (Preliminary result)   Collection Time: 01/24/20 12:22 PM   Specimen: Wound  Result Value Ref Range Status   Specimen Description   Final    WOUND Performed  at [facility], [address] [place], [place] [address]    Special Requests  SPECIMENE RIGHT KNEE MEDIAL GUTTEN  Final   Gram Stain   Final    FEW WBC PRESENT,BOTH PMN AND MONONUCLEAR NO ORGANISMS SEEN Performed at [facility], [place] [address] [place], [place] [address]    Culture   Final    RARE STAPHYLOCOCCUS AUREUS NO ANAEROBES ISOLATED; CULTURE IN PROGRESS FOR 5 DAYS    Report Status PENDING  Incomplete   Organism ID, Bacteria STAPHYLOCOCCUS AUREUS  Final      Susceptibility   Staphylococcus aureus - MIC*    CIPROFLOXACIN <=0.5 SENSITIVE Sensitive     ERYTHROMYCIN >=8 RESISTANT Resistant     GENTAMICIN <=0.5 SENSITIVE Sensitive     OXACILLIN <=0.25 SENSITIVE Sensitive     TETRACYCLINE <=1 SENSITIVE Sensitive     VANCOMYCIN 1 SENSITIVE Sensitive     TRIMETH/SULFA <=10 SENSITIVE Sensitive     CLINDAMYCIN RESISTANT Resistant     RIFAMPIN <=0.5 SENSITIVE Sensitive     Inducible Clindamycin POSITIVE Resistant     * RARE STAPHYLOCOCCUS AUREUS  Gram stain     Status: None   Collection Time: 01/24/20 12:24 PM   Specimen: Wound  Result  Value Ref Range Status   Specimen Description WOUND  Final   Special Requests F RIGHT KNEE POSTERIOR RECESS  Final   Gram Stain   Final    MANY WBC SEEN NO ORGANISMS SEEN RESULT CALLED TO, READ BACK BY AND VERIFIED WITHCaralee Ates AT [id number] 01/24/20 SDR Performed at [facility], [address], [place], [place] [address]    Report Status 01/24/2020 FINAL  Final  Aerobic/Anaerobic Culture (surgical/deep wound)     Status: None (Preliminary result)   Collection Time: 01/24/20 12:24 PM   Specimen: Wound  Result Value Ref Range Status   Specimen Description   Final    WOUND Performed at [facility], [address] [place], [place] [address]    Special Requests  SPECIMENF RIGHT KNEE POSTERIOI RECESS  Final   Gram Stain   Final    RARE WBC PRESENT, PREDOMINANTLY PMN RARE GRAM POSITIVE COCCI IN PAIRS Performed at [facility], [place] [address], [place], [place] [address]    Culture   Final    RARE STAPHYLOCOCCUS AUREUS NO ANAEROBES ISOLATED; CULTURE IN PROGRESS FOR 5 DAYS    Report Status PENDING  Incomplete   Organism ID, Bacteria STAPHYLOCOCCUS AUREUS  Final      Susceptibility   Staphylococcus aureus - MIC*    CIPROFLOXACIN <=0.5 SENSITIVE Sensitive     ERYTHROMYCIN >=8 RESISTANT Resistant     GENTAMICIN <=0.5 SENSITIVE Sensitive     OXACILLIN 0.5 SENSITIVE Sensitive     TETRACYCLINE <=1 SENSITIVE Sensitive     VANCOMYCIN 1 SENSITIVE Sensitive     TRIMETH/SULFA <=10 SENSITIVE Sensitive     CLINDAMYCIN RESISTANT Resistant     RIFAMPIN <=0.5 SENSITIVE Sensitive     Inducible Clindamycin POSITIVE Resistant     * RARE STAPHYLOCOCCUS AUREUS  Urine culture     Status: None   Collection Time: 01/24/20  2:49 PM   Specimen: Urine, Catheterized  Result Value Ref Range Status   Specimen Description   Final    URINE, CATHETERIZED Performed at [facility], [address], [place],  [address]    Special Requests   Final     NONE Performed at [facility], [address] [place], [place] [address]    Culture   Final    NO GROWTH Performed at [facility], [place] [address], [place], [place] [address]    Report Status 01/25/2020 FINAL  Final         Radiology Studies: [place] EKG SITE RITE  Result Date: 01/28/2020 If Site Rite image not attached, placement could not be confirmed due to current cardiac rhythm.       Scheduled Meds: . acidophilus  1 capsule Oral Daily  . vitamin C  500 mg Oral BID  . calcium-vitamin D  2 tablet Oral Daily  . carvedilol  3.125 mg Oral BID  . enoxaparin (LOVENOX) injection  30 mg Subcutaneous Q12H  . escitalopram  10 mg Oral Daily  . feeding supplement  237 mL Oral BID BM  . ferrous sulfate  325 mg Oral BID WC  . influenza vaccine adjuvanted  0.5 mL Intramuscular Tomorrow-1000  . loratadine  10 mg Oral Daily  . multivitamin with minerals  1 tablet Oral Daily  . multivitamin-lutein  1 capsule Oral BID  . pantoprazole  40 mg Oral BID AC  . rifampin  300 mg Oral Q12H   Continuous Infusions: .  ceFAZolin (ANCEF) IV 2 g (01/29/20 0533)  . ferumoxytol    . tranexamic acid       LOS: 7 days    Time spent: 25 mins.More than 50% of that time was spent in counseling and/or coordination of care.      Shelly Coss, MD [facility] P12/29/2021, 10:52 AM

## 2020-01-29 NOTE — Progress Notes (Signed)
Spoke with patient and patient's husband re: PICC placement. They want to speak first with the patient's doctors before placing the PICC. RN made aware.

## 2020-01-29 NOTE — Progress Notes (Signed)
ID Doing well Pain better controlled  Patient Vitals for the past 24 hrs:  BP Temp Temp src Pulse Resp SpO2  01/29/20 2105 108/60 98.8 F (37.1 C) -- 75 16 100 %  01/29/20 1639 115/66 97.8 F (36.6 C) Oral 78 17 100 %  01/29/20 1147 (!) 103/55 98.4 F (36.9 C) Oral 75 17 99 %  01/29/20 0958 127/65 98.7 F (37.1 C) Oral 90 20 98 %  01/29/20 0531 (!) 124/51 98.6 F (37 C) Oral 97 20 97 %  awake and alert Rt PICC Chest b/l air entry HSs 1s2 abd soft Rt knee wound vac Rt ankle area wound vac  Labs CBC Latest Ref Rng & Units 01/29/2020 01/28/2020 01/27/2020  WBC 4.0 - 10.5 K/uL 8.7 - -  Hemoglobin 12.0 - 15.0 g/dL 8.2(L) 8.5(L) 8.2(L)  Hematocrit 36.0 - 46.0 % 27.8(L) 28.2(L) 27.9(L)  Platelets 150 - 400 K/uL 439(H) - -   CMP Latest Ref Rng & Units 01/29/2020 01/28/2020 01/26/2020  Glucose 70 - 99 mg/dL 707(E) 675(Q) 492(E)  BUN 8 - 23 mg/dL 15 11 11   Creatinine 0.44 - 1.00 mg/dL 1.00 7.12  Sodium 135 - 145 mmol/L 137 137 136  Potassium 3.5 - 5.1 mmol/L 3.6 3.7 3.8  Chloride 98 - 111 mmol/L 102 103 103  CO2 22 - 32 mmol/L 25 27 26   Calcium 8.9 - 10.3 mg/dL 1.97 58.8  Total Protein 6.5 - 8.1 g/dL - 5.9(L) -  Total Bilirubin 0.3 - 1.2 mg/dL - 0.6 -  Alkaline Phos 38 - 126 U/L - 98 -  AST 15 - 41 U/L - 11(L) -  ALT 0 - 44 U/L - 9 -   Micro 01/23/20 ankle wound - Staph aureus-( not MRSA) Rt knee 6/6 culture staph aureus--all methicillin sensitive ( previously reported staph capitis changed to staph aureus  Impression/recommendation 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 Underwent right knee arthrotomy, irrigation debridement and polyexchange on 01/24/2020.  Patient is on cefazolin and p.o. rifampin.  She will need 6 weeks of IV antibiotic plus rifampin and then dual oral therapy for total of 6 months.  Following which she may need suppressive antibiotic therapy indefinitely if the knee hardware is going to be in  place.  Complicated infection of the right ankle. Had a fracture and later or if early this year.  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  Patient has some remaining hardware in that ankle.  There is a wound on the medial malleolar area.  Chance of eradicating the infection is minimal with hardware being present.  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  She also has a history of GI bleed  MSSA bacteremia January 2021 and received 6 weeks of IV antibiotics. "Home IV antibiotic order has been placed. I will follow her as outpatient. Discussed the management with the patient in great detail.  ID will sign off.  Call if needed.

## 2020-01-29 NOTE — Consult Note (Signed)
ORTHOPAEDICS: POD #  -right knee arthrotomy, irrigation and debridement, placement of antibiotic eluding beads, and polyethylene exchange  Intraoperative cultures were consistent with methicillin sensitive staph aureus..  There has been scant drainage from the wound VAC.  She has been tolerating physical therapy reasonably well.  I have again instructed her to avoid range of motion of the knee to allow for wound healing.  I have reviewed the plans for antibiotic coverage and discussed the patient's status with Dr. Joylene Draft.  PICC line was placed earlier today in anticipation of home antibiotics.  The patient will need home physical therapy.  I would like to continue with the wound VAC through the weekend and have her return to the office next week for a wound check.  Given the patient is a limited mobility/range of motion of both knees, I believe that the need for transportation home via EMS is necessary.  Mckenzie Bove P. Angie Fava M.D.

## 2020-01-29 NOTE — Progress Notes (Signed)
PT Cancellation Note  Patient Details Name: Jasmine Morrow MRN: [id number] DOB: 1945/12/17   Cancelled Treatment:     PT attempt. Pt getting PICC line. Will return later time when more appropriate.    Rushie Chestnut 01/29/2020, 2:52 PM

## 2020-01-30 DIAGNOSIS — D638 Anemia in other chronic diseases classified elsewhere: Secondary | ICD-10-CM

## 2020-01-30 DIAGNOSIS — T8459XA Infection and inflammatory reaction due to other internal joint prosthesis, initial encounter: Secondary | ICD-10-CM | POA: Diagnosis not present

## 2020-01-30 DIAGNOSIS — R110 Nausea: Secondary | ICD-10-CM

## 2020-01-30 DIAGNOSIS — M00071 Staphylococcal arthritis, right ankle and foot: Secondary | ICD-10-CM

## 2020-01-30 DIAGNOSIS — L02415 Cutaneous abscess of right lower limb: Secondary | ICD-10-CM | POA: Diagnosis not present

## 2020-01-30 DIAGNOSIS — A4901 Methicillin susceptible Staphylococcus aureus infection, unspecified site: Secondary | ICD-10-CM | POA: Diagnosis not present

## 2020-01-30 LAB — AEROBIC/ANAEROBIC CULTURE W GRAM STAIN (SURGICAL/DEEP WOUND)

## 2020-01-30 LAB — ANAEROBIC CULTURE

## 2020-01-30 MED ORDER — PROCHLORPERAZINE EDISYLATE 10 MG/2ML IJ SOLN
10.0000 mg | Freq: Four times a day (QID) | INTRAMUSCULAR | Status: DC | PRN
  Administered 2020-01-30: 10 mg via INTRAVENOUS
  Filled 2020-01-30 (×2): qty 2

## 2020-01-30 MED ORDER — ENOXAPARIN SODIUM 40 MG/0.4ML ~~LOC~~ SOLN: 40.0000 mg | SUBCUTANEOUS | 0 refills | Status: DC

## 2020-01-30 NOTE — Progress Notes (Signed)
PT Cancellation Note  Patient Details Name: Jasmine Morrow MRN: [id number] DOB: 05-12-1945   Cancelled Treatment:     PT attempt. 3rd attempt today. Pt is nauseous and unwilling to participate at this time. Pt request, therapist return at later time this date. Plan to DC home today however per pt/pt's spouse, "Not going to do it." Pt's spouse voiced he is unable to perform required task to administer medications that pt will be using for several weeks. "We are looking to find someone who can help [place] at home." Acute PT will continue to follow per POC.    Rushie Chestnut 01/30/2020, 11:45 AM

## 2020-01-30 NOTE — Progress Notes (Addendum)
PROGRESS NOTE    Jasmine Morrow  BJS:[id number] DOB: 02/17/1945 DOA: 01/22/2020 PCP: Rusty Aus, MD   Chief Complain: Abscess of right knee  Brief Narrative: 74 years old female with PMH of asthma, hypertension, obstructive sleep apnea, GERD, anxiety presents to the emergency department with acute onset of right ankle swelling and pain with draining wound which has developed over the last 5 days.  Patient was seen in the wound care clinic and was sent to the emergency department after she was found to have significant drainage from her wound.  Patient reports that she has complication from right ankle surgery and hardware infection and underwent several debridement surgeries in this year. Patient was admitted for sepsis secondary to wound infection.  Patient was started on ceftriaxone and vancomycin,  infectious disease and orthopedics consulted. She is found to have anemia , has received 1 unit PRBC. Hb 8.8. 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 ID were following her here.  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  ID recommended to continue cefazolin until 03/06/2020 along with rifampin.  PICC line placed.  Patient complains of severe nausea today..  Assessment & Plan:   Active Problems:   Abscess of right knee   Sepsis secondary to right knee abscess with right knee effusion: Patient initially presented with tachycardia tachypnea, hypotension.  He had lactic acid of 2.7.  She was started on broad-spectrum antibiotics with vancomycin and ceftriaxone.  Blood culture did not show any growth.  Wound culture showed MSSA.  ID were following.  Currently she is on cefazolin and rifampin.  ID recommended to continue antibiotics till 03/06/2020. Underwent PICC for home IV antibiotics.  Status post total knee replacement: Orthopedics are following.  She underwent total knee revision with scar  debridement/vaginal revision with polyexchange.  PT/OT recommended home health on discharge.  Nausea/abdominal discomfort: Continue antiemetics, Maalox.  Iron pills discontinued  Hypertension: Currently blood pressure stable.  Continue Coreg  Depression: On Lexapro  GERD: Continue Protonix  Asthma: Currently stable.  Continue bronchodilators as needed.  Microcytic anemia: Most likely secondary to acute on chronic blood loss from surgery.  Hemoglobin in the range of 8.  She was transfused with 1 unit of PRBC here.  Iron studies showed low iron.  She was given a dose of iron infusion.  Disposition: Plan is for discharge to home with home health.  Patient and her husband very anxious and concerned about lack of help at home.  Patient's husband is suffering from arthritis and he is extremely overwhelmed.  Patient and her husband requested me some time for allowing them to figure out some help at home.  She was also complaining of severe nausea which prevents the discharge today.         DVT prophylaxis:Lovenox Code Status: Full Family Communication:: Husband at bedside  Status is: Inpatient  Remains inpatient appropriate because:Inpatient level of care appropriate due to severity of illness   Dispo: The patient is from: Home              Anticipated d/c is to: Home              Anticipated d/c date is: 1 day              Patient currently is medically stable to d/c. DC plan tomorrow    Consultants: Ortho,ID  Procedures:As above  Antimicrobials:  Anti-infectives (From admission, onward)  Start     Dose/Rate Route Frequency Ordered Stop   01/28/20 2200  ceFAZolin (ANCEF) IVPB 2g/100 mL premix        2 g 200 mL/hr over 30 Minutes Intravenous Every 8 hours 01/28/20 1424     01/28/20 2200  rifampin (RIFADIN) capsule 300 mg        300 mg Oral Every 12 hours 01/28/20 1424 03/06/20 2359   01/24/20 1235  vancomycin (VANCOCIN) powder  Status:  Discontinued          As needed  01/24/20 1235 01/24/20 1434   01/24/20 1234  gentamicin (GARAMYCIN) injection  Status:  Discontinued          As needed 01/24/20 1234 01/24/20 1434   01/23/20 2200  cefTRIAXone (ROCEPHIN) 2 g in sodium chloride 0.9 % 100 mL IVPB  Status:  Discontinued        2 g 200 mL/hr over 30 Minutes Intravenous Every 24 hours 01/23/20 0935 01/27/20 1113   01/23/20 2200  vancomycin (VANCOREADY) IVPB 750 mg/150 mL  Status:  Discontinued        750 mg 150 mL/hr over 60 Minutes Intravenous Every 12 hours 01/23/20 0959 01/28/20 1542   01/23/20 0800  vancomycin (VANCOCIN) IVPB 1000 mg/200 mL premix  Status:  Discontinued        1,000 mg 200 mL/hr over 60 Minutes Intravenous Every 12 hours 01/23/20 0331 01/23/20 0959   01/22/20 2230  vancomycin (VANCOCIN) IVPB 1000 mg/200 mL premix  Status:  Discontinued        1,000 mg 200 mL/hr over 60 Minutes Intravenous  Once 01/22/20 2220 01/23/20 0033   01/22/20 2230  ceFAZolin (ANCEF) 2 g in dextrose 5 % 100 mL IVPB  Status:  Discontinued        2 g 240 mL/hr over 30 Minutes Intravenous Every 8 hours 01/22/20 2220 01/23/20 0935   01/22/20 2145  vancomycin (VANCOCIN) IVPB 1000 mg/200 mL premix        1,000 mg 200 mL/hr over 60 Minutes Intravenous  Once 01/22/20 2139 01/22/20 2348   01/22/20 2145  cefTRIAXone (ROCEPHIN) 2 g in sodium chloride 0.9 % 100 mL IVPB        2 g 200 mL/hr over 30 Minutes Intravenous  Once 01/22/20 2139 01/22/20 2229      Subjective: Patient seen and examined at the bedside this morning.  Hemodynamically stable.  Husband at the bedside.  Very anxious and emotional about her overall status and burden on her husband.  Objective: Vitals:   01/29/20 1639 01/29/20 2105 01/30/20 0347 01/30/20 0800  BP: 115/66 108/60 (!) 105/49 (!) 121/59  Pulse: 78 75 82 95  Resp: 17 16 16 16   Temp: 97.8 F (36.6 C) 98.8 F (37.1 C) 98 F (36.7 C) 98.7 F (37.1 C)  TempSrc: Oral  Oral Oral  SpO2: 100% 100% 97% 98%  Weight:      Height:         Intake/Output Summary (Last 24 hours) at 01/30/2020 1306 Last data filed at 01/30/2020 0900 Gross per 24 hour  Intake 880 ml  Output --  Net 880 ml   Filed Weights   01/23/20 0210  Weight: 88.5 kg    Examination:  General exam: Emotional, tearful HEENT:PERRL,Oral mucosa moist, Ear/Nose normal on gross exam Respiratory system: Bilateral equal air entry, normal vesicular breath sounds, no wheezes or crackles  Cardiovascular system: S1 & S2 heard, RRR. No JVD, murmurs, rubs, gallops or clicks. Gastrointestinal system: Abdomen is  nondistended, soft and nontender. No organomegaly or masses felt. Normal bowel sounds heard. Central nervous system: Alert and oriented. No focal neurological deficits. Extremities: No edema, no clubbing ,no cyanosis, right knee wrapped with dressings/immobilizer/drain skin: No rashes, lesions or ulcers,no icterus ,no pallor    Data Reviewed: I have personally reviewed following labs and imaging studies  CBC: Recent Labs  Lab 01/25/20 0450 01/26/20 0550 01/27/20 0524 01/28/20 0621 01/29/20 0530  WBC 11.7* 10.5  --   --  8.7  HGB 8.3* 8.5* 8.2* 8.5* 8.2*  HCT 27.8* 28.8* 27.9* 28.2* 27.8*  MCV 72.6* 73.3*  --   --  73.7*  PLT 492* 485*  --   --  439*   Basic Metabolic Panel: Recent Labs  Lab 01/24/20 0538 01/25/20 0450 01/26/20 0550 01/28/20 0621 01/29/20 0530  NA 142 139 136 137 137  K 3.9 4.2 3.8 3.7 3.6  CL 110 106 103 103 102  CO2 25 26 26 27 25   GLUCOSE 94 115* 103* 107* 117*  BUN 9 10 11 11 15   CREATININE 0.71 0.80 0.66 0.65 0.70  CALCIUM 9.6 9.7 10.3 10.2 10.1  MG 1.9 1.8 2.1 2.0 2.0  PHOS 3.8 3.3 2.9 3.2 3.6   GFR: Estimated Creatinine Clearance: 74.3 mL/min (by C-G formula based on SCr of 0.7 mg/dL). Liver Function Tests: Recent Labs  Lab 01/28/20 0621  AST 11*  ALT 9  ALKPHOS 98  BILITOT 0.6  PROT 5.9*  ALBUMIN 2.3*   No results for input(s): LIPASE, AMYLASE in the last 168 hours. No results for input(s):  AMMONIA in the last 168 hours. Coagulation Profile: No results for input(s): INR, PROTIME in the last 168 hours. Cardiac Enzymes: No results for input(s): CKTOTAL, CKMB, CKMBINDEX, TROPONINI in the last 168 hours. BNP (last 3 results) No results for input(s): PROBNP in the last 8760 hours. HbA1C: No results for input(s): HGBA1C in the last 72 hours. CBG: No results for input(s): GLUCAP in the last 168 hours. Lipid Profile: No results for input(s): CHOL, HDL, LDLCALC, TRIG, CHOLHDL, LDLDIRECT in the last 72 hours. Thyroid Function Tests: No results for input(s): TSH, T4TOTAL, FREET4, T3FREE, THYROIDAB in the last 72 hours. Anemia Panel: No results for input(s): VITAMINB12, FOLATE, FERRITIN, TIBC, IRON, RETICCTPCT in the last 72 hours. Sepsis Labs: No results for input(s): PROCALCITON, LATICACIDVEN in the last 168 hours.  Recent Results (from the past 240 hour(s))  Aerobic Culture (superficial specimen)     Status: None   Collection Time: 01/22/20  1:50 PM   Specimen: KNEE  Result Value Ref Range Status   Specimen Description   Final    KNEE Performed at [facility], [address], [place], [address] [place]    Special Requests   Final    NONE Performed at [facility], [address] [place], [address] [place]    Gram Stain   Final    RARE WBC PRESENT,BOTH PMN AND MONONUCLEAR NO ORGANISMS SEEN    Culture   Final    RARE NORMAL SKIN FLORA Performed at [facility], [address] [place], [address] [place]    Report Status 01/25/2020 FINAL  Final  Culture, blood (Routine x 2)     Status: None   Collection Time: 01/22/20  7:15 PM   Specimen: BLOOD  Result Value Ref Range Status   Specimen Description BLOOD BLOOD RIGHT HAND  Final   Special Requests   Final    BOTTLES DRAWN AEROBIC ONLY  Blood Culture results may not be optimal due to an inadequate volume of blood received in culture bottles   Culture   Final    NO GROWTH 5 DAYS Performed  at [facility], [address] [place], [place] [address]    Report Status 01/27/2020 FINAL  Final  Culture, blood (Routine x 2)     Status: None   Collection Time: 01/22/20 10:02 PM   Specimen: BLOOD  Result Value Ref Range Status   Specimen Description BLOOD RIGHT ANTECUBITAL  Final   Special Requests   Final    BOTTLES DRAWN AEROBIC AND ANAEROBIC Blood Culture adequate volume   Culture   Final    NO GROWTH 5 DAYS Performed at [facility], [address] [place], [place] [address]    Report Status 01/27/2020 FINAL  Final  Resp Panel by RT-PCR (Flu A&B, Covid) Nasopharyngeal Swab     Status: None   Collection Time: 01/22/20 10:02 PM   Specimen: Nasopharyngeal Swab; Nasopharyngeal(NP) swabs in vial transport medium  Result Value Ref Range Status   SARS Coronavirus 2 by RT PCR NEGATIVE NEGATIVE Final    Comment: (NOTE) SARS-CoV-2 target nucleic acids are NOT DETECTED.  The SARS-CoV-2 RNA is generally detectable in upper respiratory specimens during the acute phase of infection. The lowest concentration of SARS-CoV-2 viral copies this assay can detect is 138 copies/mL. A negative result does not preclude SARS-Cov-2 infection and should not be used as the sole basis for treatment or other patient management decisions. A negative result may occur with  improper specimen collection/handling, submission of specimen other than nasopharyngeal swab, presence of viral mutation(s) within the areas targeted by this assay, and inadequate number of viral copies(<138 copies/mL). A negative result must be combined with clinical observations, patient history, and epidemiological information. The expected result is Negative.  Fact Sheet for Patients:  BloggerCourse.com  Fact Sheet for Healthcare Providers:  SeriousBroker.it  This test is no t yet approved or cleared by the [place] FDA and  has been authorized for  detection and/or diagnosis of SARS-CoV-2 by FDA under an Emergency Use Authorization (EUA). This EUA will remain  in effect (meaning this test can be used) for the duration of the COVID-19 declaration under Section 564(b)(1) of the Act, 21 U.S.C.section 360bbb-3(b)(1), unless the authorization is terminated  or revoked sooner.       Influenza A by PCR NEGATIVE NEGATIVE Final   Influenza B by PCR NEGATIVE NEGATIVE Final    Comment: (NOTE) The Xpert Xpress SARS-CoV-2/FLU/RSV plus assay is intended as an aid in the diagnosis of influenza from Nasopharyngeal swab specimens and should not be used as a sole basis for treatment. Nasal washings and aspirates are unacceptable for Xpert Xpress SARS-CoV-2/FLU/RSV testing.  Fact Sheet for Patients: BloggerCourse.com  Fact Sheet for Healthcare Providers: SeriousBroker.it  This test is not yet approved or cleared by the [place] FDA and has been authorized for detection and/or diagnosis of SARS-CoV-2 by FDA under an Emergency Use Authorization (EUA). This EUA will remain in effect (meaning this test can be used) for the duration of the COVID-19 declaration under Section 564(b)(1) of the Act, 21 U.S.C. section 360bbb-3(b)(1), unless the authorization is terminated or revoked.  Performed at [facility], [address] [place], [place] [address]   Aerobic Culture (superficial specimen)     Status: None   Collection Time: 01/23/20  7:02 PM   Specimen: Wound  Result Value Ref Range Status   Specimen  Description   Final    WOUND Performed at [facility], [address] [place], [place] [address]    Special Requests   Final    NONE Performed at [facility], [address] [place], [place] [address]    Gram Stain   Final    FEW WBC PRESENT, PREDOMINANTLY PMN NO ORGANISMS SEEN Performed at [facility], [address] [place], [place]  [address]    Culture RARE STAPHYLOCOCCUS AUREUS  Final   Report Status 01/28/2020 FINAL  Final   Organism ID, Bacteria STAPHYLOCOCCUS AUREUS  Final      Susceptibility   Staphylococcus aureus - MIC*    CIPROFLOXACIN <=0.5 SENSITIVE Sensitive     ERYTHROMYCIN >=8 RESISTANT Resistant     GENTAMICIN <=0.5 SENSITIVE Sensitive     OXACILLIN 0.5 SENSITIVE Sensitive     TETRACYCLINE <=1 SENSITIVE Sensitive     VANCOMYCIN 1 SENSITIVE Sensitive     TRIMETH/SULFA <=10 SENSITIVE Sensitive     CLINDAMYCIN RESISTANT Resistant     RIFAMPIN <=0.5 SENSITIVE Sensitive     Inducible Clindamycin POSITIVE Resistant     * RARE STAPHYLOCOCCUS AUREUS  Anaerobic culture     Status: None   Collection Time: 01/24/20 11:43 AM   Specimen: PATH Other; Wound  Result Value Ref Range Status   Specimen Description SYNOVIAL RIGHT KNEE  Final   Special Requests RECEIVED IN STERILE CUP  Final   Gram Stain   Final    ABUNDANT WBC PRESENT,BOTH PMN AND MONONUCLEAR NO ORGANISMS SEEN    Culture   Final    NO ANAEROBES ISOLATED Performed at [facility], [address], [place], [place] [address]    Report Status 01/30/2020 FINAL  Final  Gram stain     Status: None   Collection Time: 01/24/20 11:43 AM   Specimen: PATH Other; Wound  Result Value Ref Range Status   Specimen Description WOUND  Final   Special Requests NONE  Final   Gram Stain   Final    MODERATE WBC SEEN NO ORGANISMS SEEN RESULT CALLED TO, READ BACK BY AND VERIFIED WITH: Knute Neu AT 1218 01/24/20 SDR Performed at [facility], [address], [place], [place] [address]    Report Status 01/24/2020 FINAL  Final  Aerobic/Anaerobic Culture (surgical/deep wound)     Status: None   Collection Time: 01/24/20 11:43 AM   Specimen: Synovial, Right Knee; Body Fluid  Result Value Ref Range Status   Specimen Description WOUND RIGHT KNEE  Final   Special Requests SUPERFICIAL SWAB  Final   Gram Stain   Final    RARE WBC PRESENT,  PREDOMINANTLY MONONUCLEAR RARE GRAM POSITIVE COCCI IN CLUSTERS    Culture   Final    RARE STAPHYLOCOCCUS AUREUS NO ANAEROBES ISOLATED Performed at [facility], [address], [place], [place] [address]    Report Status 01/30/2020 FINAL  Final   Organism ID, Bacteria STAPHYLOCOCCUS AUREUS  Final      Susceptibility   Staphylococcus aureus - MIC*    CIPROFLOXACIN <=0.5 SENSITIVE Sensitive     ERYTHROMYCIN >=8 RESISTANT Resistant     GENTAMICIN <=0.5 SENSITIVE Sensitive     OXACILLIN 0.5 SENSITIVE Sensitive     TETRACYCLINE <=1 SENSITIVE Sensitive     VANCOMYCIN 1 SENSITIVE Sensitive     TRIMETH/SULFA <=10 SENSITIVE Sensitive     CLINDAMYCIN RESISTANT Resistant     RIFAMPIN <=0.5 SENSITIVE Sensitive  Inducible Clindamycin POSITIVE Resistant     * RARE STAPHYLOCOCCUS AUREUS  Body fluid culture     Status: None   Collection Time: 01/24/20 11:43 AM   Specimen: Synovium  Result Value Ref Range Status   Specimen Description SYNOVIAL FLUID RIGHT KNEE  Final   Special Requests RECEIVED IN A CUP  Final   Gram Stain   Final    ABUNDANT WBC PRESENT, PREDOMINANTLY PMN NO ORGANISMS SEEN    Culture   Final    RARE STAPHYLOCOCCUS AUREUS CRITICAL RESULT CALLED TO, READ BACK BY AND VERIFIED WITH: PHARMD L KLUTTZ [id number] AT 903 BY CM Performed at [facility], [address] [place], [place] [address]    Report Status 01/28/2020 FINAL  Final   Organism ID, Bacteria STAPHYLOCOCCUS AUREUS  Final      Susceptibility   Staphylococcus aureus - MIC*    CIPROFLOXACIN <=0.5 SENSITIVE Sensitive     ERYTHROMYCIN >=8 RESISTANT Resistant     GENTAMICIN <=0.5 SENSITIVE Sensitive     OXACILLIN 0.5 SENSITIVE Sensitive     TETRACYCLINE <=1 SENSITIVE Sensitive     VANCOMYCIN 1 SENSITIVE Sensitive     TRIMETH/SULFA <=10 SENSITIVE Sensitive     CLINDAMYCIN RESISTANT Resistant     RIFAMPIN <=0.5 SENSITIVE Sensitive     Inducible Clindamycin POSITIVE Resistant     * RARE STAPHYLOCOCCUS  AUREUS  Acid Fast Smear (AFB)     Status: None   Collection Time: 01/24/20 11:48 AM   Specimen: Synovial, Right Knee; Body Fluid  Result Value Ref Range Status   AFB Specimen Processing Concentration  Final   Acid Fast Smear Negative  Final    Comment: (NOTE) Performed At: [facility] [address] [place], [place] [id number] Jolene Schimke MD NM:[id number]    Source (AFB) WOUND  Final    Comment: Performed at [facility], [address] [place], [place] [address]  Gram stain     Status: None   Collection Time: 01/24/20 12:15 PM   Specimen: Wound  Result Value Ref Range Status   Specimen Description WOUND  Final   Special Requests C RIGHT KNEE  SUPRA PATELLAR POUCH   Final   Gram Stain   Final    MODERATE WBC SEEN NO ORGANISMS SEEN RESULT CALLED TO, READ BACK BY AND VERIFIED WITHKnute Neu AT 1303 01/24/20 SDR Performed at [facility], [address] [place], [place] [address]    Report Status 01/24/2020 FINAL  Final  Aerobic/Anaerobic Culture (surgical/deep wound)     Status: None   Collection Time: 01/24/20 12:15 PM   Specimen: Wound  Result Value Ref Range Status   Specimen Description WOUND RIGHT KNEE  Final   Special Requests SPECIMEN C SUPRA PATELLAR POUCH  Final   Gram Stain RARE NO WBC SEEN NO ORGANISMS SEEN   Final   Culture   Final    RARE STAPHYLOCOCCUS AUREUS CORRECTED RESULTS PREVIOUSLY REPORTED AS: STAPHYLOCOCCUS CAPITIS CORRECTED RESULTS CALLED TO: PHARMD L KLUTTZ [id number] AT 1333 BY CM NO ANAEROBES ISOLATED Performed at [facility], [address] [place], [place] [address]    Report Status 01/30/2020 FINAL  Final   Organism ID, Bacteria STAPHYLOCOCCUS AUREUS  Final      Susceptibility   Staphylococcus aureus - MIC*    CIPROFLOXACIN <=0.5 SENSITIVE Sensitive     ERYTHROMYCIN >=8 RESISTANT Resistant     GENTAMICIN <=0.5 SENSITIVE Sensitive     OXACILLIN <=0.25 SENSITIVE Sensitive  TETRACYCLINE <=1  SENSITIVE Sensitive     VANCOMYCIN <=0.5 SENSITIVE Sensitive     TRIMETH/SULFA <=10 SENSITIVE Sensitive     CLINDAMYCIN RESISTANT Resistant     RIFAMPIN <=0.5 SENSITIVE Sensitive     Inducible Clindamycin POSITIVE Resistant     * RARE STAPHYLOCOCCUS AUREUS  Gram stain     Status: None   Collection Time: 01/24/20 12:20 PM   Specimen: Wound  Result Value Ref Range Status   Specimen Description WOUND  Final   Special Requests D RIGHT  KNEE LATERAL GUTTEN  Final   Gram Stain   Final    FEW WBC SEEN NO ORGANISMS SEEN TERESA BENSON AT 1303 01/24/20 SDR Performed at [facility], [address] [place], [place] [address]    Report Status 01/24/2020 FINAL  Final  Aerobic/Anaerobic Culture (surgical/deep wound)     Status: None   Collection Time: 01/24/20 12:20 PM   Specimen: Wound  Result Value Ref Range Status   Specimen Description   Final    WOUND Performed at [facility], [address] [place], [place] [address]    Special Requests  SPECIMEN D RIGHT KNEE LATERAL GUTTERS  Final   Gram Stain   Final    RARE WBC PRESENT,BOTH PMN AND MONONUCLEAR NO ORGANISMS SEEN    Culture   Final    RARE STAPHYLOCOCCUS AUREUS NO ANAEROBES ISOLATED Performed at [facility], [address], [place], [place] [address]    Report Status 01/30/2020 FINAL  Final   Organism ID, Bacteria STAPHYLOCOCCUS AUREUS  Final      Susceptibility   Staphylococcus aureus - MIC*    CIPROFLOXACIN <=0.5 SENSITIVE Sensitive     ERYTHROMYCIN >=8 RESISTANT Resistant     GENTAMICIN <=0.5 SENSITIVE Sensitive     OXACILLIN 0.5 SENSITIVE Sensitive     TETRACYCLINE <=1 SENSITIVE Sensitive     VANCOMYCIN <=0.5 SENSITIVE Sensitive     TRIMETH/SULFA <=10 SENSITIVE Sensitive     CLINDAMYCIN RESISTANT Resistant     RIFAMPIN <=0.5 SENSITIVE Sensitive     Inducible Clindamycin POSITIVE Resistant     * RARE STAPHYLOCOCCUS AUREUS  Gram stain     Status: None   Collection Time: 01/24/20 12:22 PM    Specimen: Wound  Result Value Ref Range Status   Specimen Description WOUND  Final   Special Requests E RIGHT KNEE MEDIAL GUTTEN  Final   Gram Stain   Final    FEW WBC SEEN NO ORGANISMS SEEN RESULT CALLED TO, READ BACK BY AND VERIFIED WITHKnute Neu AT 1321 01/24/20 SDR Performed at [facility], [address] [place], [place] [address]    Report Status 01/24/2020 FINAL  Final  Aerobic/Anaerobic Culture (surgical/deep wound)     Status: None   Collection Time: 01/24/20 12:22 PM   Specimen: Wound  Result Value Ref Range Status   Specimen Description   Final    WOUND Performed at [facility], [address] [place], [place] [address]    Special Requests  SPECIMENE RIGHT KNEE MEDIAL GUTTEN  Final   Gram Stain   Final    FEW WBC PRESENT,BOTH PMN AND MONONUCLEAR NO ORGANISMS SEEN    Culture   Final    RARE STAPHYLOCOCCUS AUREUS NO ANAEROBES ISOLATED Performed at [facility], [address], [place], [place] [address]    Report Status 01/30/2020 FINAL  Final   Organism ID, Bacteria STAPHYLOCOCCUS AUREUS  Final  Susceptibility   Staphylococcus aureus - MIC*    CIPROFLOXACIN <=0.5 SENSITIVE Sensitive     ERYTHROMYCIN >=8 RESISTANT Resistant     GENTAMICIN <=0.5 SENSITIVE Sensitive     OXACILLIN <=0.25 SENSITIVE Sensitive     TETRACYCLINE <=1 SENSITIVE Sensitive     VANCOMYCIN 1 SENSITIVE Sensitive     TRIMETH/SULFA <=10 SENSITIVE Sensitive     CLINDAMYCIN RESISTANT Resistant     RIFAMPIN <=0.5 SENSITIVE Sensitive     Inducible Clindamycin POSITIVE Resistant     * RARE STAPHYLOCOCCUS AUREUS  Gram stain     Status: None   Collection Time: 01/24/20 12:24 PM   Specimen: Wound  Result Value Ref Range Status   Specimen Description WOUND  Final   Special Requests F RIGHT KNEE POSTERIOR RECESS  Final   Gram Stain   Final    MANY WBC SEEN NO ORGANISMS SEEN RESULT CALLED TO, READ BACK BY AND VERIFIED WITHKnute Neu: TERESA BENSON AT 1321 01/24/20  SDR Performed at [facility], [address] [place], [place] [address]    Report Status 01/24/2020 FINAL  Final  Aerobic/Anaerobic Culture (surgical/deep wound)     Status: None   Collection Time: 01/24/20 12:24 PM   Specimen: Wound  Result Value Ref Range Status   Specimen Description   Final    WOUND Performed at [facility], [address] [place], [place] [address]    Special Requests  SPECIMENF RIGHT KNEE POSTERIOI RECESS  Final   Gram Stain   Final    RARE WBC PRESENT, PREDOMINANTLY PMN RARE GRAM POSITIVE COCCI IN PAIRS    Culture   Final    RARE STAPHYLOCOCCUS AUREUS NO ANAEROBES ISOLATED Performed at [facility] [facility], [address] [place], [place] [address]    Report Status 01/30/2020 FINAL  Final   Organism ID, Bacteria STAPHYLOCOCCUS AUREUS  Final      Susceptibility   Staphylococcus aureus - MIC*    CIPROFLOXACIN <=0.5 SENSITIVE Sensitive     ERYTHROMYCIN >=8 RESISTANT Resistant     GENTAMICIN <=0.5 SENSITIVE Sensitive     OXACILLIN 0.5 SENSITIVE Sensitive     TETRACYCLINE <=1 SENSITIVE Sensitive     VANCOMYCIN 1 SENSITIVE Sensitive     TRIMETH/SULFA <=10 SENSITIVE Sensitive     CLINDAMYCIN RESISTANT Resistant     RIFAMPIN <=0.5 SENSITIVE Sensitive     Inducible Clindamycin POSITIVE Resistant     * RARE STAPHYLOCOCCUS AUREUS  Urine culture     Status: None   Collection Time: 01/24/20  2:49 PM   Specimen: Urine, Catheterized  Result Value Ref Range Status   Specimen Description   Final    URINE, CATHETERIZED Performed at [facility], [address] [place], [place] [address]    Special Requests   Final    NONE Performed at [facility], [address] [place], [place] [address]    Culture   Final    NO GROWTH Performed at [facility], [address] [place], [place] [address]    Report Status 01/25/2020 FINAL  Final         Radiology Studies: US EKG SITE RITE  Result Date:  01/28/2020 If Site Rite image not attached, placement could not be confirmed due to current cardiac rhythm.       Scheduled Meds: . acidophilus  1 capsule Oral Daily  . vitamin C  500 mg Oral BID  . calcium-vitamin D  2 tablet Oral Daily  .  carvedilol  3.125 mg Oral BID  . Chlorhexidine Gluconate Cloth  6 each Topical Daily  . enoxaparin (LOVENOX) injection  30 mg Subcutaneous Q12H  . escitalopram  10 mg Oral Daily  . feeding supplement  237 mL Oral BID BM  . ferrous sulfate  325 mg Oral BID WC  . influenza vaccine adjuvanted  0.5 mL Intramuscular Tomorrow-1000  . loratadine  10 mg Oral Daily  . multivitamin with minerals  1 tablet Oral Daily  . multivitamin-lutein  1 capsule Oral BID  . pantoprazole  40 mg Oral BID AC  . rifampin  300 mg Oral Q12H  . sodium chloride flush  10-40 mL Intracatheter Q12H   Continuous Infusions: .  ceFAZolin (ANCEF) IV 2 g (01/30/20 0523)     LOS: 8 days    Time spent: 25 mins.More than 50% of that time was spent in counseling and/or coordination of care.      Burnadette Pop, MD [facility] P12/30/2021, 1:06 PM

## 2020-01-30 NOTE — Progress Notes (Signed)
°  Subjective: 6 Days Post-Op Procedure(s) (LRB): TOTAL KNEE REVISION WITH SCAR DEBRIDEMENT/PATELLA REVISION WITH POLY EXCHANGE (Right) Patient reports pain as mild.   Patient continues to complain of AM nausea. Plan is to go Home after hospital stay. Negative for chest pain and shortness of breath Fever: no Gastrointestinal: negative for vomiting.   Patient has had a bowel movement.  Objective: Vital signs in last 24 hours: Temp:  [98 F (36.7 C)-98.8 F (37.1 C)] 98.7 F (37.1 C) (12/30 0800) Pulse Rate:  [75-95] 95 (12/30 0800) Resp:  [16] 16 (12/30 0800) BP: (105-121)/(49-60) 121/59 (12/30 0800) SpO2:  [97 %-100 %] 98 % (12/30 0800)  Intake/Output from previous day:  Intake/Output Summary (Last 24 hours) at 01/30/2020 1803 Last data filed at 01/30/2020 0900 Gross per 24 hour  Intake 640 ml  Output --  Net 640 ml    Intake/Output this shift: No intake/output data recorded.  Labs: Recent Labs    01/28/20 0621 01/29/20 0530  HGB 8.5* 8.2*   Recent Labs    01/28/20 0621 01/29/20 0530  WBC  --  8.7  RBC  --  3.77*  HCT 28.2* 27.8*  PLT  --  439*   Recent Labs    01/28/20 0621 01/29/20 0530  NA 137 137  K 3.7 3.6  CL 103 102  CO2 27 25  BUN 11 15  CREATININE 0.65 0.70  GLUCOSE 107* 117*  CALCIUM 10.2 10.1   No results for input(s): LABPT, INR in the last 72 hours.   EXAM General - Patient is Alert, Appropriate and Oriented Extremity - Neurovascular intact Dorsiflexion/Plantar flexion intact Compartment soft Dressing/Incision -Prevena in place over the ankle and knee. ~150 mL of drainage noted in the canister Motor Function - intact, moving foot and toes well on exam.  Cardiovascular- Regular rate and rhythm, no murmurs/rubs/gallops Respiratory- Lungs clear to auscultation bilaterally Gastrointestinal- soft and nontender  Left knee: increased warmth, no erythema. No pain with gentle passive ROM.   Assessment/Plan: 6 Days Post-Op Procedure(s)  (LRB): TOTAL KNEE REVISION WITH SCAR DEBRIDEMENT/PATELLA REVISION WITH POLY EXCHANGE (Right) Active Problems:   Abscess of right knee  Estimated body mass index is 28.81 kg/m as calculated from the following:   Height as of this encounter: 5\' 9"  (1.753 m).   Weight as of this encounter: 88.5 kg. Advance diet Up with therapy  Discharge home per medicine. Patient to continue with knee immobilizer when ambulating.   DVT Prophylaxis - Lovenox, Ted hose and foot pumps Weight-Bearing as tolerated to right leg  , PA-C [facility] 01/30/2020, 6:03 PM

## 2020-01-30 NOTE — Progress Notes (Signed)
PT Cancellation Note  Patient Details Name: Jasmine Morrow MRN: [id number] DOB: Feb 06, 1945   Cancelled Treatment:     PT attempt. Pt continues to state she is having severe nausea. Max encouragement to participate however pt continues to be resistive. Discussed importance of performing there ex in bed when nausea is relieved. Reviewed all PT aspects of care and pt states confidence in all. She Is planning to DC home tomorrow. She did state to discuss care with son due to husband feeling overhelmed easily.    Rushie Chestnut 01/30/2020, 3:12 PM

## 2020-01-30 NOTE — TOC Progression Note (Addendum)
Transition of Care [facility]) - Progression Note    Patient Details  Name: Jasmine Morrow MRN: [id number] Date of Birth: Jun 01, 1945  Transition of Care [facility]) CM/SW Contact  Maree Krabbe, LCSW Phone Number: 01/30/2020, 2:47 PM  Clinical Narrative: Pam with [facility] is working with family on home IV antibiotics. Anticipate dc tomorrow as pt's spouse was too overwhelmed for pt to d/c home today. Pt will also get a HH aid in addition to PT, OT, RN, and Social Work, with [facility] to assist in care.      Expected Discharge Plan: Home w Home Health Services Barriers to Discharge: Continued Medical Work up  Expected Discharge Plan and Services Expected Discharge Plan: Home w Home Health Services In-house Referral: Clinical Social Work   Post Acute Care Choice: Home Health Living arrangements for the past 2 months: Single Family Home                   DME Agency: NA       HH Arranged: RN,PT HH Agency: [facility] Date HH Agency Contacted: 01/28/20 Time HH Agency Contacted: 1517 Representative spoke with at [facility] Agency: Barbara Cower   Social Determinants of Health (SDOH) Interventions    Readmission Risk Interventions No flowsheet data found.

## 2020-01-30 NOTE — Care Management Important Message (Signed)
Important Message  Patient Details  Name: Jasmine Morrow MRN: [id number] Date of Birth: 1945/09/13   Medicare Important Message Given:  Yes     Johnell Comings 01/30/2020, 12:45 PM

## 2020-01-30 NOTE — Progress Notes (Signed)
ID Pt c/o severe nausea. Says it is the worst she has ever had Has been present since morning No relief to zofran, maalox No vomiting Was not able to eat much  Patient Vitals for the past 24 hrs:  BP Temp Temp src Pulse Resp SpO2  01/30/20 1939 (!) 114/40 98.8 F (37.1 C) Oral 82 17 97 %  01/30/20 0800 (!) 121/59 98.7 F (37.1 C) Oral 95 16 98 %  01/30/20 0347 (!) 105/49 98 F (36.7 C) Oral 82 16 97 %  01/29/20 2105 108/60 98.8 F (37.1 C) -- 75 16 100 %    O/e awake and alert Chest b/l air entry Hss1s2 And soft Rt PICC Rt knee surgical site dressing not removed Rt ankle wound vac Labs CBC Latest Ref Rng & Units 01/29/2020 01/28/2020 01/27/2020  WBC 4.0 - 10.5 K/uL 8.7 - -  Hemoglobin 12.0 - 15.0 g/dL 8.2(L) 8.5(L) 8.2(L)  Hematocrit 36.0 - 46.0 % 27.8(L) 28.2(L) 27.9(L)  Platelets 150 - 400 K/uL 439(H) - -    CMP Latest Ref Rng & Units 01/29/2020 01/28/2020 01/26/2020  Glucose 70 - 99 mg/dL 175(Z) 025(E) 527(P)  BUN 8 - 23 mg/dL 15 11 11   Creatinine 0.44 - 1.00 mg/dL 8.24 2.35  Sodium 135 - 145 mmol/L 137 137 136  Potassium 3.5 - 5.1 mmol/L 3.6 3.7 3.8  Chloride 98 - 111 mmol/L 102 103 103  CO2 22 - 32 mmol/L 25 27 26   Calcium 8.9 - 10.3 mg/dL 3.61 44.3  Total Protein 6.5 - 8.1 g/dL - 5.9(L) -  Total Bilirubin 0.3 - 1.2 mg/dL - 0.6 -  Alkaline Phos 38 - 126 U/L - 98 -  AST 15 - 41 U/L - 11(L) -  ALT 0 - 44 U/L - 9 -    Impression/recommendation 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 Underwent right knee arthrotomy, irrigation and debridement and polyexchange on 01/24/2020.  She is currently on cefazolin and p.o. rifampin.  She will need 6 weeks of IV antibiotic plus rifampin and and then dual oral therapy for a total of 6 months.  She may need suppressive antibiotic therapy indefinitely if the knee hardware is going to be in place.  Severe nausea since this morning.  Likely medication related.  Rifampin can cause  nausea.  We will hold it tonight and see whether she improves tomorrow if she improves then we will rechallenge her with Zofran. Complicated infection of the right ankle.  Had a fracture earlier this year.  And later had ORIF. 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  She has remaining hardware in the right ankle.  Chance of eradicating infection is minimal with hardware being present.  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  She also has a history of GI bleed.  MSSA bacteremia January 2021 and received 6 weeks of IV antibiotics.  Discussed the management with the patient in detail.

## 2020-01-31 DIAGNOSIS — T8459XA Infection and inflammatory reaction due to other internal joint prosthesis, initial encounter: Secondary | ICD-10-CM | POA: Diagnosis not present

## 2020-01-31 DIAGNOSIS — L02415 Cutaneous abscess of right lower limb: Secondary | ICD-10-CM | POA: Diagnosis not present

## 2020-01-31 DIAGNOSIS — D638 Anemia in other chronic diseases classified elsewhere: Secondary | ICD-10-CM | POA: Diagnosis not present

## 2020-01-31 DIAGNOSIS — Z96659 Presence of unspecified artificial knee joint: Secondary | ICD-10-CM | POA: Diagnosis not present

## 2020-01-31 DIAGNOSIS — A4901 Methicillin susceptible Staphylococcus aureus infection, unspecified site: Secondary | ICD-10-CM | POA: Diagnosis not present

## 2020-01-31 MED ORDER — ONDANSETRON HCL 4 MG PO TABS: 4.0000 mg | ORAL_TABLET | Freq: Four times a day (QID) | ORAL | 0 refills | Status: DC | PRN

## 2020-01-31 MED ORDER — TRAMADOL HCL 50 MG PO TABS: 50.0000 mg | ORAL_TABLET | Freq: Four times a day (QID) | ORAL | 0 refills | Status: DC | PRN

## 2020-01-31 MED ORDER — ALUM & MAG HYDROXIDE-SIMETH 200-200-20 MG/5ML PO SUSP: 30.0000 mL | ORAL | 0 refills | Status: DC | PRN

## 2020-01-31 MED ORDER — POLYETHYLENE GLYCOL 3350 17 G PO PACK: 17.0000 g | PACK | Freq: Every day | ORAL | 1 refills | Status: AC | PRN

## 2020-01-31 MED ORDER — RIFAMPIN 300 MG PO CAPS: 300.0000 mg | ORAL_CAPSULE | Freq: Two times a day (BID) | ORAL | 0 refills | Status: AC

## 2020-01-31 MED ORDER — CEFAZOLIN IV (FOR PTA / DISCHARGE USE ONLY): 2.0000 g | Freq: Three times a day (TID) | INTRAVENOUS | 0 refills | Status: AC

## 2020-01-31 MED ORDER — SODIUM CHLORIDE 0.9 % IV SOLN
INTRAVENOUS | Status: DC | PRN
  Administered 2020-01-31: 250 mL via INTRAVENOUS

## 2020-01-31 NOTE — Progress Notes (Signed)
Physical Therapy Treatment Patient Details Name: Jasmine Morrow MRN: [id number] DOB: [birth year] Today's Date: 01/31/2020    History of Present Illness Pt is a 74 y/o F who was admitted on 01/22/20 after presenting to ED with R ankle swelling, pain, & draining wound which has developed over the last 5 days. Pt admitted for sepsis 2/2 wound infection. 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 PMH: asthma, HTN, OSA, GERD, anxiety.    PT Comments    Pt reluctantly agreeable to tx with encouragement. Pt performs BLE strengthening exercises with AAROM and cuing for proper technique. Pt demonstrates significantly limited LLE knee flexion during heel slides (~30 degrees) & notes pain 2/2 "atrophy". Pt reports she hasn't been doing good (re: functional progress) since this PT saw her last but PT provides encouragement as pt was able to ambulate during AM session which is a great improvement.     Follow Up Recommendations  Home health PT     Equipment Recommendations  None recommended by PT    Recommendations for Other Services       Precautions / Restrictions Precautions Precautions: Fall Precaution Comments: No ROM to R Knee Required Braces or Orthoses: Knee Immobilizer - Right Knee Immobilizer - Right: On when out of bed or walking Restrictions Weight Bearing Restrictions: Yes RLE Weight Bearing: Weight bearing as tolerated    Mobility  Bed Mobility Overal bed mobility: Needs Assistance Bed Mobility: Supine to Sit;Sit to Supine     Supine to sit: Supervision;HOB elevated Sit to supine: Min assist;HOB elevated      Transfers Overall transfer level: Needs assistance Equipment used: Rolling walker (2 wheeled) Transfers: Sit to/from Stand Sit to Stand: Mod assist;From elevated surface            Ambulation/Gait Ambulation/Gait assistance: Min guard Gait Distance (Feet): 12 Feet Assistive device: Rolling walker (2  wheeled)   Gait velocity: decreased   General Gait Details: pt ambulated to doorway of room and return   Stairs             Wheelchair Mobility    Modified Rankin (Stroke Patients Only)       Balance Overall balance assessment: Needs assistance Sitting-balance support: No upper extremity supported;Feet supported Sitting balance-Leahy Scale: Good     Standing balance support: Bilateral upper extremity supported Standing balance-Leahy Scale: Fair Standing balance comment: reliant on UE support to maintain balance however no LOB noted during standing activity                            Cognition Arousal/Alertness: Awake/alert Behavior During Therapy: WFL for tasks assessed/performed Overall Cognitive Status: Within Functional Limits for tasks assessed                                 General Comments: Pt A and O x 4      Exercises General Exercises - Lower Extremity Ankle Circles/Pumps: AROM;Right;Left;Both;Supine;10 reps Gluteal Sets: AROM;Both;Supine;10 reps Short Arc Quad: AROM;Left;Supine;10 reps;Strengthening Heel Slides: AROM;Strengthening;Supine;Left;10 reps Hip ABduction/ADduction: AAROM;10 reps;Right;Supine;Strengthening;Left (hip abduction) Straight Leg Raises: AAROM;Left;Supine;10 reps    General Comments        Pertinent Vitals/Pain Pain Assessment: No/denies pain Pain Score: 5  Faces Pain Scale: No hurt Pain Location: B knees Pain Descriptors / Indicators: Discomfort Pain Intervention(s): Monitored during session    Home Living  Prior Function            PT Goals (current goals can now be found in the care plan section) Acute Rehab PT Goals Patient Stated Goal: to go home PT Goal Formulation: With patient/family Time For Goal Achievement: 02/09/20 Potential to Achieve Goals: Fair Progress towards PT goals: Progressing toward goals    Frequency    BID      PT Plan  Current plan remains appropriate    Co-evaluation              AM-PAC PT "6 Clicks" Mobility   Outcome Measure  Help needed turning from your back to your side while in a flat bed without using bedrails?: A Little Help needed moving from lying on your back to sitting on the side of a flat bed without using bedrails?: A Little Help needed moving to and from a bed to a chair (including a wheelchair)?: A Little Help needed standing up from a chair using your arms (e.g., wheelchair or bedside chair)?: A Lot Help needed to walk in hospital room?: A Little Help needed climbing 3-5 steps with a railing? : A Lot 6 Click Score: 16    End of Session Equipment Utilized During Treatment: Right knee immobilizer;Gait belt Activity Tolerance: Patient tolerated treatment well Patient left: in bed;with call bell/phone within reach;with bed alarm set (BLE foot pumps donned) Nurse Communication: Mobility status;Precautions PT Visit Diagnosis: Muscle weakness (generalized) (M62.81);Difficulty in walking, not elsewhere classified (R26.2);Pain Pain - Right/Left: Right Pain - part of body: Knee     Time: [id number] PT Time Calculation (min) (ACUTE ONLY): 11 min  Charges: $Therapeutic Exercise: 8-22 mins                     Lavone Nian, PT, DPT 01/31/20, 3:52 PM    [name] 01/31/2020, 3:50 PM

## 2020-01-31 NOTE — Progress Notes (Signed)
Physical Therapy Treatment Patient Details Name: Jasmine Morrow MRN: [id number] DOB: [birth year] Today's Date: 01/31/2020    History of Present Illness Pt is a 74 y/o F who was admitted on 01/22/20 after presenting to ED with R ankle swelling, pain, & draining wound which has developed over the last 5 days. Pt admitted for sepsis 2/2 wound infection. 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 PMH: asthma, HTN, OSA, GERD, anxiety.    PT Comments    Pt was supine in bed with HOB elevated ~ 20 degrees. She reports feeling better than yesterday and is planning to DC home later today. Assisted with placement of knee immobilizer prior to OOB activity. Pt was able to exit L side of bed, stand and ambulate ~ 12 ft with RW. Overall tolerated session well and is progressing. Pt will benefit from HHPT at DC to address deficits and improve safe functional abilities.    Follow Up Recommendations  Home health PT     Equipment Recommendations  None recommended by PT    Recommendations for Other Services       Precautions / Restrictions Precautions Precautions: Fall Precaution Comments: No ROM to R Knee Required Braces or Orthoses: Knee Immobilizer - Right Knee Immobilizer - Right: On when out of bed or walking Restrictions Weight Bearing Restrictions: Yes RLE Weight Bearing: Weight bearing as tolerated    Mobility  Bed Mobility Overal bed mobility: Needs Assistance Bed Mobility: Supine to Sit;Sit to Supine     Supine to sit: Supervision;HOB elevated Sit to supine: Min assist;HOB elevated      Transfers Overall transfer level: Needs assistance Equipment used: Rolling walker (2 wheeled) Transfers: Sit to/from Stand Sit to Stand: Mod assist;From elevated surface            Ambulation/Gait Ambulation/Gait assistance: Min guard Gait Distance (Feet): 12 Feet Assistive device: Rolling walker (2 wheeled)   Gait velocity:  decreased   General Gait Details: pt ambulated to doorway of room and return       Balance Overall balance assessment: Needs assistance Sitting-balance support: No upper extremity supported;Feet supported Sitting balance-Leahy Scale: Good     Standing balance support: Bilateral upper extremity supported Standing balance-Leahy Scale: Fair Standing balance comment: reliant on UE support to maintain balance however no LOB noted during standing activity      Cognition Arousal/Alertness: Awake/alert Behavior During Therapy: WFL for tasks assessed/performed Overall Cognitive Status: Within Functional Limits for tasks assessed        General Comments: Pt A and O x 4             Pertinent Vitals/Pain Pain Assessment: No/denies pain Pain Score: 0-No pain Faces Pain Scale: No hurt Pain Location: RLE Pain Descriptors / Indicators: Discomfort Pain Intervention(s): Limited activity within patient's tolerance;Monitored during session;Premedicated before session;Repositioned           PT Goals (current goals can now be found in the care plan section) Acute Rehab PT Goals Patient Stated Goal: to go home Progress towards PT goals: Progressing toward goals    Frequency    BID      PT Plan Current plan remains appropriate       AM-PAC PT "6 Clicks" Mobility   Outcome Measure  Help needed turning from your back to your side while in a flat bed without using bedrails?: A Little Help needed moving from lying on your back to sitting on the side of a flat bed without using  bedrails?: A Little Help needed moving to and from a bed to a chair (including a wheelchair)?: A Little Help needed standing up from a chair using your arms (e.g., wheelchair or bedside chair)?: A Lot Help needed to walk in hospital room?: A Little Help needed climbing 3-5 steps with a railing? : A Lot 6 Click Score: 16    End of Session Equipment Utilized During Treatment: Right knee immobilizer;Gait  belt Activity Tolerance: Patient tolerated treatment well;Patient limited by fatigue Patient left: in bed;with call bell/phone within reach;with bed alarm set Nurse Communication: Mobility status;Precautions PT Visit Diagnosis: Muscle weakness (generalized) (M62.81);Difficulty in walking, not elsewhere classified (R26.2);Pain Pain - Right/Left: Right Pain - part of body: Leg     Time: 1100-1125 PT Time Calculation (min) (ACUTE ONLY): 25 min  Charges:  $Gait Training: 8-22 mins $Therapeutic Activity: 8-22 mins                     Jetta Lout PTA 01/31/20, 1:01 PM

## 2020-01-31 NOTE — Progress Notes (Signed)
ID Pt says nausea improved by 30% since stopping rifampin She would like to hold off for now and restart Monday with zofran No fever No pain Patient Vitals for the past 24 hrs:  BP Temp Temp src Pulse Resp SpO2  01/31/20 1634 (!) 107/50 -- -- 79 16 100 %  01/31/20 0742 116/67 98 F (36.7 C) Oral 79 18 --  01/31/20 0449 122/63 97.8 F (36.6 C) Oral 80 17 98 %  01/30/20 1939 (!) 114/40 98.8 F (37.1 C) Oral 82 17 97 %   Awake and alert Chest cta Hss1s2 abd soft Rt knee- wound vac Rt foot vac  Labs CBC Latest Ref Rng & Units 01/29/2020 01/28/2020 01/27/2020  WBC 4.0 - 10.5 K/uL 8.7 - -  Hemoglobin 12.0 - 15.0 g/dL 8.2(L) 8.5(L) 8.2(L)  Hematocrit 36.0 - 46.0 % 27.8(L) 28.2(L) 27.9(L)  Platelets 150 - 400 K/uL 439(H) - -    CMP Latest Ref Rng & Units 01/29/2020 01/28/2020 01/26/2020  Glucose 70 - 99 mg/dL 097(D) 532(D) 924(Q)  BUN 8 - 23 mg/dL 15 11 11   Creatinine 0.44 - 1.00 mg/dL 6.83 4.19  Sodium 135 - 145 mmol/L 137 137 136  Potassium 3.5 - 5.1 mmol/L 3.6 3.7 3.8  Chloride 98 - 111 mmol/L 102 103 103  CO2 22 - 32 mmol/L 25 27 26   Calcium 8.9 - 10.3 mg/dL 6.22 29.7  Total Protein 6.5 - 8.1 g/dL - 5.9(L) -  Total Bilirubin 0.3 - 1.2 mg/dL - 0.6 -  Alkaline Phos 38 - 126 U/L - 98 -  AST 15 - 41 U/L - 11(L) -  ALT 0 - 44 U/L - 9 -   Impression/recommendation 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  Underwent debridement and polyexchange on 01/24/2020.  Currently on cefazolin.  Rifampin has been on hold since last night because of nausea.  The plan is to restart rifampin on Monday once she goes home with preceding Zofran to prevent any nausea.  She will need 6 weeks of IV antibiotics plus rifampin because of hardware being present and then will need dual oral therapy for a total of 6 months. Discussed with patient the importance of taking rifampin.   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  She also has a history of GI  bleed.  Complicated infection of the right ankle.  Had fracture earlier this year and had ORIF surgery.  Complicated by infection at the site with MSSA.  Treated for MSSA bacteremia January 2021 with 6 weeks of IV antibiotics.  She has a follow-up appointment with me as outpatient. ID will sign off.  Call if needed.

## 2020-01-31 NOTE — TOC Progression Note (Addendum)
Transition of Care [facility]) - Progression Note    Patient Details  Name: Jasmine Morrow MRN: [id number] Date of Birth: 27-Sep-1945  Transition of Care [facility] CM/SW Contact  Maree Krabbe, LCSW Phone Number: 01/31/2020, 1:04 PM  Clinical Narrative:   Unfortunately pt will be unable to dc today because home health was arranged to help her today at 2 however, after that would not be able to accommodate her today for IV antibiotics, CSW will arranged transport for tomorrow because she needs to be home by 9 in order for them to help. CSW will set up transport for 8. MD and RN notified.   Transport is scheduled for 8:00 via [facility].    Expected Discharge Plan: Home w Home Health Services Barriers to Discharge: Continued Medical Work up  Expected Discharge Plan and Services Expected Discharge Plan: Home w Home Health Services In-house Referral: Clinical Social Work   Post Acute Care Choice: Home Health Living arrangements for the past 2 months: Single Family Home                   DME Agency: NA       HH Arranged: RN,PT HH Agency: [facility] Date HH Agency Contacted: 01/28/20 Time HH Agency Contacted: [phone] Representative spoke with at [facility] Agency: Barbara Cower   Social Determinants of Health (SDOH) Interventions    Readmission Risk Interventions No flowsheet data found.

## 2020-01-31 NOTE — Discharge Summary (Signed)
Physician Discharge Summary  Jasmine Morrow UJW:[id number] DOB: 06-Apr-1945 DOA: 01/22/2020  PCP: Rusty Aus, MD  Admit date: 01/22/2020 Discharge date: 01/31/2020  Admitted From: Home Disposition:  Home  Discharge Condition:Stable CODE STATUS:FULL Diet recommendation: Heart Healthy    Brief/Interim Summary: 74 years old female with PMH of asthma, hypertension, obstructive sleep apnea, GERD, anxiety presents to the emergency department with acute onset of right ankle swelling and pain with draining wound which has developed over the last 5 days. Patient was seen in the wound care clinic and was sent to the emergency department after she was found to have significant drainage from her wound. Patient reports that she has complication from right ankle surgery and hardware infection and underwent several debridement surgeries in this year. Patient was admitted for sepsis secondary to wound infection. Patient was started on ceftriaxone and vancomycin, infectious disease and orthopedics consulted. She is found to have anemia , has received 1 unit PRBC. Hb 8.8. 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 ID were following her here.  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  ID recommended to continue cefazolin until 03/06/2020 along with rifampin.  PICC line placed.  She is medically stable for discharge to home.  She will follow-up with orthopedics as an outpatient.   Following problems were addressed during her hospitalization:  Sepsis secondary to right knee abscess with right knee effusion: Patient initially presented with tachycardia tachypnea, hypotension.  He had lactic acid of 2.7.  She was started on broad-spectrum antibiotics with vancomycin and ceftriaxone.  Blood culture did not show any growth.  Wound culture showed MSSA.  ID were following.  Currently she is on cefazolin and rifampin.   ID recommended to continue antibiotics till 03/06/2020. Underwent PICC for home IV antibiotics.  Status post total knee replacement: Orthopedics are following.  She underwent total knee revision with scar debridement/vaginal revision with polyexchange.  PT/OT recommended home health on discharge.  She will follow-up with orthopedics as an outpatient  Nausea/abdominal discomfort: Continue antiemetics, Maalox.   Hypertension: Currently blood pressure stable.  Continue Coreg  Depression: On Lexapro  GERD: Continue Protonix  Asthma: Currently stable.  Continue bronchodilators as needed.  Microcytic anemia: Most likely secondary to acute on chronic blood loss from surgery.  Hemoglobin in the range of 8.  She was transfused with 1 unit of PRBC here.  Iron studies showed low iron.  She was given a dose of iron infusion.   Discharge Diagnoses:  Active Problems:   Abscess of right knee    Discharge Instructions  Discharge Instructions    [facility] pharmacist to adjust dose for Vancomycin, Aminoglycosides and other anti-infective therapies as requested by physician.   Complete by: As directed    [facility] to provide Cath Flo 12m   Complete by: As directed    Administer for PICC line occlusion and as ordered by physician for other access device issues.   Anaphylaxis Kit: Provided to treat any anaphylactic reaction to the medication being provided to the patient if First Dose or when requested by physician   Complete by: As directed    Epinephrine 151mml vial / amp: Administer 0.36m336m0.36ml7mubcutaneously once for moderate to severe anaphylaxis, nurse to call physician and pharmacy when reaction occurs and call 911 if needed for immediate care   Diphenhydramine 50mg64mIV vial: Administer 25-50mg 336mM PRN for first dose reaction, rash, itching,  mild reaction, nurse to call physician and pharmacy when reaction occurs   Sodium Chloride 0.9% NS 565m IV:  Administer if needed for hypovolemic blood pressure drop or as ordered by physician after call to physician with anaphylactic reaction   Change dressing on IV access line weekly and PRN   Complete by: As directed    Diet - low sodium heart healthy   Complete by: As directed    Discharge instructions   Complete by: As directed    1)Please take prescribed medications as instructed 2)Follow up with Home healh 3)Follow up with orthopedics on 02/04/2020.  Name and number of the provider has been attached 4)Do a CBC and BMP tests in a week   Flush IV access with Sodium Chloride 0.9% and Heparin 10 units/ml or 100 units/ml   Complete by: As directed    Home infusion instructions - [facility]   Complete by: As directed    Instructions: Flush IV access with Sodium Chloride 0.9% and Heparin 10units/ml or 100units/ml   Change dressing on IV access line: Weekly and PRN   Instructions Cath Flo 244m Administer for PICC Line occlusion and as ordered by physician for other access device   [facility] pharmacist to adjust dose for: Vancomycin, Aminoglycosides and other anti-infective therapies as requested by physician   Increase activity slowly   Complete by: As directed    Method of administration may be changed at the discretion of home infusion pharmacist based upon assessment of the patient and/or caregiver's ability to self-administer the medication ordered   Complete by: As directed    No wound care   Complete by: As directed      Allergies as of 01/31/2020      Reactions   Celecoxib Nausea Only   Hydrocodone-acetaminophen Nausea And Vomiting   Meloxicam Nausea Only   Morphine Sulfate Er Beads Itching   Naproxen Swelling   Oxycodone Other (See Comments)   disorientation   Rofecoxib Nausea Only   Sulfa Antibiotics Other (See Comments)   Reaction: unknown   Ciprofloxacin Rash   Nickel Rash   Tramadol-acetaminophen Nausea Only, Rash   Per pt she takes this when PT  comes only makes her feel "Loopy" 11/08/2019      Medication List    STOP taking these medications   doxycycline 100 MG capsule Commonly known as: MONODOX   nitrofurantoin (macrocrystal-monohydrate) 100 MG capsule Commonly known as: MACROBID     TAKE these medications   albuterol 108 (90 Base) MCG/ACT inhaler Commonly known as: VENTOLIN HFA Inhale 2 puffs into the lungs every 4 (four) hours as needed.   alum & mag hydroxide-simeth 200-200-20 MG/5ML suspension Commonly known as: MAALOX/MYLANTA Take 30 mLs by mouth every 4 (four) hours as needed for indigestion.   aspirin EC 81 MG tablet Take 81 mg by mouth daily. Swallow whole.   CALAZIME SKIN PROTECTANT EX Apply 1 application topically 4 (four) times daily as needed (diaper changes).   Calcium-Vitamin D 600-200 MG-UNIT tablet Take 2 tablets by mouth daily.   carvedilol 3.125 MG tablet Commonly known as: COREG Take 3.125 mg by mouth 2 (two) times daily.   ceFAZolin  IVPB Commonly known as: ANCEF Inject 2 g into the vein every 8 (eight) hours. Indication:  MSSA R knee PJI First Dose: Yes Last Day of Therapy:  03/06/2020 Labs - Once weekly on MONDAYs:  CBC/D, CMP, ESR and CRP Method of administration: IV Push Method of administration may be changed at  the discretion of home infusion pharmacist based upon assessment of the patient and/or caregiver's ability to self-administer the medication ordered.   cetirizine 10 MG tablet Commonly known as: ZYRTEC Take 10 mg by mouth daily.   enoxaparin 40 MG/0.4ML injection Commonly known as: LOVENOX Inject 0.4 mLs (40 mg total) into the skin daily for 14 days.   escitalopram 10 MG tablet Commonly known as: LEXAPRO Take 10 mg by mouth daily.   hydroxypropyl methylcellulose / hypromellose 2.5 % ophthalmic solution Commonly known as: ISOPTO TEARS / GONIOVISC Place 1 drop into both eyes 3 (three) times daily as needed for dry eyes.   multivitamin with minerals Tabs tablet Take 1  tablet by mouth daily.   nystatin ointment Commonly known as: MYCOSTATIN Apply 1 application topically daily as needed for irritation.   ondansetron 4 MG tablet Commonly known as: ZOFRAN Take 1 tablet (4 mg total) by mouth every 6 (six) hours as needed for nausea.   pantoprazole 40 MG tablet Commonly known as: PROTONIX Take 1 tablet (40 mg total) by mouth 2 (two) times daily before a meal. What changed: when to take this   polyethylene glycol 17 g packet Commonly known as: MiraLax Take 17 g by mouth daily as needed.   PRESERVISION AREDS 2 PO Take 1 tablet by mouth 2 (two) times daily.   rifampin 300 MG capsule Commonly known as: Rifadin Take 1 capsule (300 mg total) by mouth 2 (two) times daily.   traMADol 50 MG tablet Commonly known as: Ultram Take 1 tablet (50 mg total) by mouth every 6 (six) hours as needed.            Durable Medical Equipment  (From admission, onward)         Start     Ordered   01/24/20 1720  DME Walker rolling  Once       Question:  Patient needs a walker to treat with the following condition  Answer:  Total knee replacement status   01/24/20 1720   01/24/20 1720  DME Bedside commode  Once       Question:  Patient needs a bedside commode to treat with the following condition  Answer:  Total knee replacement status   01/24/20 1720           Discharge Care Instructions  (From admission, onward)         Start     Ordered   01/31/20 0000  Change dressing on IV access line weekly and PRN  (Home infusion instructions - [facility] )        01/31/20 1455          Follow-up Information    Dereck Leep, MD Follow up on 02/04/2020.   Specialty: Orthopedic Surgery Why: 02/04/20 @ 11:45 for incision check.  Contact information: [place] [address] [phone]        Rusty Aus, MD. Schedule an appointment as soon as possible for a visit in 1 week(s).   Specialty: Internal  Medicine Contact information: [place] [address] [phone]              Allergies  Allergen Reactions  . Celecoxib Nausea Only  . Hydrocodone-Acetaminophen Nausea And Vomiting  . Meloxicam Nausea Only  . Morphine Sulfate Er Beads Itching  . Naproxen Swelling  . Oxycodone Other (See Comments)    disorientation  . Rofecoxib Nausea Only  .  Sulfa Antibiotics Other (See Comments)    Reaction: unknown  . Ciprofloxacin Rash  . Nickel Rash  . Tramadol-Acetaminophen Nausea Only and Rash    Per pt she takes this when PT comes only makes her feel "Loopy" 11/08/2019    Consultations:  Ortho,ID   Procedures/Studies: DG Tibia/Fibula Right  Result Date: 01/22/2020 CLINICAL DATA:  Nonhealing right foot wound. EXAM: RIGHT TIBIA AND FIBULA - 2 VIEW COMPARISON:  March 14, 2019 FINDINGS: There is no evidence of an acute fracture or dislocation. A chronic fracture deformity is seen involving the shaft of the mid right fibula. Radiopaque fixation plates and screws are also seen involving the distal right tibia, right medial malleolus and right lateral malleolus. A chronic deformity is seen involving the right ankle mortise and adjacent portion of the dome of the talus. Moderate severity diffuse soft tissue swelling is noted. IMPRESSION: 1. Chronic fracture deformity of the mid right fibula with chronic posttraumatic and postsurgical changes involving the right ankle, as described above. 2. Moderate severity diffuse soft tissue swelling. Electronically Signed   By: Virgina Norfolk M.D.   On: 01/22/2020 19:54   DG Knee Complete 4 Views Right  Result Date: 01/22/2020 CLINICAL DATA:  Anterior right knee wound. 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 A curvilinear cortical lucency is seen within the medial aspect of the distal right femur, adjacent to the distal  right femoral prosthesis. A chronic fracture deformity seen involving the mid right fibular shaft. There is no evidence of dislocation. A large joint effusion is seen. Subsequent marked severity anterior and medial soft tissue swelling is seen. No soft tissue air is identified. IMPRESSION: 1. Right knee replacement with a nondisplaced fracture the of indeterminate age adjacent to the medial aspect of the distal right femoral prosthesis. 2. Large joint effusion. Electronically Signed   By: Virgina Norfolk M.D.   On: 01/22/2020 19:50   [place] EKG SITE RITE  Result Date: 01/28/2020 If Site Rite image not attached, placement could not be confirmed due to current cardiac rhythm.     Subjective: Patient seen and examined at the bedside this morning.  Hemodynamically stable for discharge  Discharge Exam: Vitals:   01/31/20 0449 01/31/20 0742  BP: 122/63 116/67  Pulse: 80 79  Resp: 17 18  Temp: 97.8 F (36.6 C) 98 F (36.7 C)  SpO2: 98%    Vitals:   01/30/20 0800 01/30/20 1939 01/31/20 0449 01/31/20 0742  BP: (!) 121/59 (!) 114/40 122/63 116/67  Pulse: 95 82 80 79  Resp: 16 17 17 18   Temp: 98.7 F (37.1 C) 98.8 F (37.1 C) 97.8 F (36.6 C) 98 F (36.7 C)  TempSrc: Oral Oral Oral Oral  SpO2: 98% 97% 98%   Weight:      Height:        General: Pt is alert, awake, not in acute distress Cardiovascular: RRR, S1/S2 +, no rubs, no gallops Respiratory: CTA bilaterally, no wheezing, no rhonchi Abdominal: Soft, NT, ND, bowel sounds + Extremities: no edema, no cyanosis, dressing on the right knee    The results of significant diagnostics from this hospitalization (including imaging, microbiology, ancillary and laboratory) are listed below for reference.     Microbiology: Recent Results (from the past 240 hour(s))  Aerobic Culture (superficial specimen)     Status: None   Collection Time: 01/22/20  1:50 PM   Specimen: KNEE  Result Value Ref Range Status  Specimen Description    Final    KNEE Performed at [facility], [address] [place], [place] [address]    Special Requests   Final    NONE Performed at [facility], [place], [place] [address]    Gram Stain   Final    RARE WBC PRESENT,BOTH PMN AND MONONUCLEAR NO ORGANISMS SEEN    Culture   Final    RARE NORMAL SKIN FLORA Performed at [facility], [place] [address] [place], [place] [address]    Report Status 01/25/2020 FINAL  Final  Culture, blood (Routine x 2)     Status: None   Collection Time: 01/22/20  7:15 PM   Specimen: BLOOD  Result Value Ref Range Status   Specimen Description BLOOD BLOOD RIGHT HAND  Final   Special Requests   Final    BOTTLES DRAWN AEROBIC ONLY Blood Culture results may not be optimal due to an inadequate volume of blood received in culture bottles   Culture   Final    NO GROWTH 5 DAYS Performed at [facility], [address], [place], [place] [address]    Report Status 01/27/2020 FINAL  Final  Culture, blood (Routine x 2)     Status: None   Collection Time: 01/22/20 10:02 PM   Specimen: BLOOD  Result Value Ref Range Status   Specimen Description BLOOD RIGHT ANTECUBITAL  Final   Special Requests   Final    BOTTLES DRAWN AEROBIC AND ANAEROBIC Blood Culture adequate volume   Culture   Final    NO GROWTH 5 DAYS Performed at [facility], [address] [place], [place] [address]    Report Status 01/27/2020 FINAL  Final  Resp Panel by RT-PCR (Flu A&B, Covid) Nasopharyngeal Swab     Status: None   Collection Time: 01/22/20 10:02 PM   Specimen: Nasopharyngeal Swab; Nasopharyngeal(NP) swabs in vial transport medium  Result Value Ref Range Status   SARS Coronavirus 2 by RT PCR NEGATIVE NEGATIVE Final    Comment: (NOTE) SARS-CoV-2 target nucleic acids are NOT DETECTED.  The SARS-CoV-2 RNA is generally detectable in upper respiratory specimens during the acute phase of infection. The lowest concentration of  SARS-CoV-2 viral copies this assay can detect is 138 copies/mL. A negative result does not preclude SARS-Cov-2 infection and should not be used as the sole basis for treatment or other patient management decisions. A negative result may occur with  improper specimen collection/handling, submission of specimen other than nasopharyngeal swab, presence of viral mutation(s) within the areas targeted by this assay, and inadequate number of viral copies(<138 copies/mL). A negative result must be combined with clinical observations, patient history, and epidemiological information. The expected result is Negative.  Fact Sheet for Patients:  EntrepreneurPulse.com.au  Fact Sheet for Healthcare Providers:  IncredibleEmployment.be  This test is no t yet approved or cleared by the [place] FDA and  has been authorized for detection and/or diagnosis of SARS-CoV-2 by FDA under an Emergency Use Authorization (EUA). This EUA will remain  in effect (meaning this test can be used) for the duration of the COVID-19 declaration under Section 564(b)(1) of the Act, 21 U.S.C.section 360bbb-3(b)(1), unless the authorization is terminated  or revoked sooner.       Influenza A by PCR NEGATIVE NEGATIVE Final   Influenza B by PCR NEGATIVE NEGATIVE Final    Comment: (NOTE) The Xpert Xpress SARS-CoV-2/FLU/RSV plus assay is intended as an aid in the diagnosis  of influenza from Nasopharyngeal swab specimens and should not be used as a sole basis for treatment. Nasal washings and aspirates are unacceptable for Xpert Xpress SARS-CoV-2/FLU/RSV testing.  Fact Sheet for Patients: EntrepreneurPulse.com.au  Fact Sheet for Healthcare Providers: IncredibleEmployment.be  This test is not yet approved or cleared by the [place] FDA and has been authorized for detection and/or diagnosis of SARS-CoV-2 by FDA under an Emergency Use  Authorization (EUA). This EUA will remain in effect (meaning this test can be used) for the duration of the COVID-19 declaration under Section 564(b)(1) of the Act, 21 U.S.C. section 360bbb-3(b)(1), unless the authorization is terminated or revoked.  Performed at [facility], [address], [place], [place] [address]   Aerobic Culture (superficial specimen)     Status: None   Collection Time: 01/23/20  7:02 PM   Specimen: Wound  Result Value Ref Range Status   Specimen Description   Final    WOUND Performed at [facility], [address], [place], [place] [address]    Special Requests   Final    NONE Performed at [facility], [address] [place], [place] [address]    Gram Stain   Final    FEW WBC PRESENT, PREDOMINANTLY PMN NO ORGANISMS SEEN Performed at [facility], [place] [address], [place], [place] [address]    Culture RARE STAPHYLOCOCCUS AUREUS  Final   Report Status 01/28/2020 FINAL  Final   Organism ID, Bacteria STAPHYLOCOCCUS AUREUS  Final      Susceptibility   Staphylococcus aureus - MIC*    CIPROFLOXACIN <=0.5 SENSITIVE Sensitive     ERYTHROMYCIN >=8 RESISTANT Resistant     GENTAMICIN <=0.5 SENSITIVE Sensitive     OXACILLIN 0.5 SENSITIVE Sensitive     TETRACYCLINE <=1 SENSITIVE Sensitive     VANCOMYCIN 1 SENSITIVE Sensitive     TRIMETH/SULFA <=10 SENSITIVE Sensitive     CLINDAMYCIN RESISTANT Resistant     RIFAMPIN <=0.5 SENSITIVE Sensitive     Inducible Clindamycin POSITIVE Resistant     * RARE STAPHYLOCOCCUS AUREUS  Anaerobic culture     Status: None   Collection Time: 01/24/20 11:43 AM   Specimen: PATH Other; Wound  Result Value Ref Range Status   Specimen Description SYNOVIAL RIGHT KNEE  Final   Special Requests RECEIVED IN STERILE CUP  Final   Gram Stain   Final    ABUNDANT WBC PRESENT,BOTH PMN AND MONONUCLEAR NO ORGANISMS SEEN    Culture   Final    NO ANAEROBES ISOLATED Performed at [facility],  [address], [place], [place] [address]    Report Status 01/30/2020 FINAL  Final  Gram stain     Status: None   Collection Time: 01/24/20 11:43 AM   Specimen: PATH Other; Wound  Result Value Ref Range Status   Specimen Description WOUND  Final   Special Requests NONE  Final   Gram Stain   Final    MODERATE WBC SEEN NO ORGANISMS SEEN RESULT CALLED TO, READ BACK BY AND VERIFIED WITH: Caralee Ates AT 1218 01/24/20 SDR Performed at [facility], [address] [place],  [address]    Report Status 01/24/2020 FINAL  Final  Aerobic/Anaerobic Culture (surgical/deep wound)     Status: None   Collection Time: 01/24/20 11:43 AM   Specimen: Synovial, Right Knee; Body Fluid  Result Value Ref Range Status   Specimen Description WOUND RIGHT KNEE  Final   Special Requests SUPERFICIAL SWAB  Final   Gram Stain   Final    RARE WBC PRESENT, PREDOMINANTLY MONONUCLEAR RARE GRAM POSITIVE COCCI IN CLUSTERS    Culture   Final    RARE STAPHYLOCOCCUS AUREUS NO ANAEROBES ISOLATED Performed at [facility], [place] [address] [place], [place] [address]    Report Status 01/30/2020 FINAL  Final   Organism ID, Bacteria STAPHYLOCOCCUS AUREUS  Final      Susceptibility   Staphylococcus aureus - MIC*    CIPROFLOXACIN <=0.5 SENSITIVE Sensitive     ERYTHROMYCIN >=8 RESISTANT Resistant     GENTAMICIN <=0.5 SENSITIVE Sensitive     OXACILLIN 0.5 SENSITIVE Sensitive     TETRACYCLINE <=1 SENSITIVE Sensitive     VANCOMYCIN 1 SENSITIVE Sensitive     TRIMETH/SULFA <=10 SENSITIVE Sensitive     CLINDAMYCIN RESISTANT Resistant     RIFAMPIN <=0.5 SENSITIVE Sensitive     Inducible Clindamycin POSITIVE Resistant     * RARE STAPHYLOCOCCUS AUREUS  Body fluid culture     Status: None   Collection Time: 01/24/20 11:43 AM   Specimen: Synovium  Result Value Ref Range Status   Specimen Description SYNOVIAL FLUID RIGHT KNEE  Final   Special Requests RECEIVED IN A CUP  Final   Gram Stain   Final     ABUNDANT WBC PRESENT, PREDOMINANTLY PMN NO ORGANISMS SEEN    Culture   Final    RARE STAPHYLOCOCCUS AUREUS CRITICAL RESULT CALLED TO, READ BACK BY AND VERIFIED WITH: PHARMD L KLUTTZ [id number] AT 903 BY CM Performed at [facility], [place] [address], [place], [place] [address]    Report Status 01/28/2020 FINAL  Final   Organism ID, Bacteria STAPHYLOCOCCUS AUREUS  Final      Susceptibility   Staphylococcus aureus - MIC*    CIPROFLOXACIN <=0.5 SENSITIVE Sensitive     ERYTHROMYCIN >=8 RESISTANT Resistant     GENTAMICIN <=0.5 SENSITIVE Sensitive     OXACILLIN 0.5 SENSITIVE Sensitive     TETRACYCLINE <=1 SENSITIVE Sensitive     VANCOMYCIN 1 SENSITIVE Sensitive     TRIMETH/SULFA <=10 SENSITIVE Sensitive     CLINDAMYCIN RESISTANT Resistant     RIFAMPIN <=0.5 SENSITIVE Sensitive     Inducible Clindamycin POSITIVE Resistant     * RARE STAPHYLOCOCCUS AUREUS  Acid Fast Smear (AFB)     Status: None   Collection Time: 01/24/20 11:48 AM   Specimen: Synovial, Right Knee; Body Fluid  Result Value Ref Range Status   AFB Specimen Processing Concentration  Final   Acid Fast Smear Negative  Final    Comment: (NOTE) Performed At: [facility] [place], [place] [id number] Rush Farmer MD SX:[id number]    Source (AFB) WOUND  Final    Comment: Performed at [facility], [address] [place], [place] [address]  Gram stain     Status: None   Collection Time: 01/24/20 12:15 PM   Specimen: Wound  Result Value Ref Range Status   Specimen Description WOUND  Final   Special Requests C RIGHT KNEE  SUPRA PATELLAR POUCH   Final   Gram Stain   Final    MODERATE WBC SEEN NO ORGANISMS SEEN RESULT CALLED TO, READ BACK BY AND VERIFIED WITHCaralee Ates AT 4718 01/24/20 SDR Performed at [facility], [address], [place], [place] [address]    Report Status 01/24/2020 FINAL  Final  Aerobic/Anaerobic Culture (surgical/deep wound)     Status: None    Collection Time:  01/24/20 12:15 PM   Specimen: Wound  Result Value Ref Range Status   Specimen Description WOUND RIGHT KNEE  Final   Special Requests SPECIMEN C SUPRA PATELLAR POUCH  Final   Gram Stain RARE NO WBC SEEN NO ORGANISMS SEEN   Final   Culture   Final    RARE STAPHYLOCOCCUS AUREUS CORRECTED RESULTS PREVIOUSLY REPORTED AS: STAPHYLOCOCCUS CAPITIS CORRECTED RESULTS CALLED TO: PHARMD L KLUTTZ [id number] AT 5409 BY CM NO ANAEROBES ISOLATED Performed at [facility], [place] [address], [place], [place] [address]    Report Status 01/30/2020 FINAL  Final   Organism ID, Bacteria STAPHYLOCOCCUS AUREUS  Final      Susceptibility   Staphylococcus aureus - MIC*    CIPROFLOXACIN <=0.5 SENSITIVE Sensitive     ERYTHROMYCIN >=8 RESISTANT Resistant     GENTAMICIN <=0.5 SENSITIVE Sensitive     OXACILLIN <=0.25 SENSITIVE Sensitive     TETRACYCLINE <=1 SENSITIVE Sensitive     VANCOMYCIN <=0.5 SENSITIVE Sensitive     TRIMETH/SULFA <=10 SENSITIVE Sensitive     CLINDAMYCIN RESISTANT Resistant     RIFAMPIN <=0.5 SENSITIVE Sensitive     Inducible Clindamycin POSITIVE Resistant     * RARE STAPHYLOCOCCUS AUREUS  Gram stain     Status: None   Collection Time: 01/24/20 12:20 PM   Specimen: Wound  Result Value Ref Range Status   Specimen Description WOUND  Final   Special Requests D RIGHT  KNEE LATERAL GUTTEN  Final   Gram Stain   Final    FEW WBC SEEN NO ORGANISMS SEEN TERESA BENSON AT 4782 01/24/20 SDR Performed at [facility], [address], [place], [place] [address]    Report Status 01/24/2020 FINAL  Final  Aerobic/Anaerobic Culture (surgical/deep wound)     Status: None   Collection Time: 01/24/20 12:20 PM   Specimen: Wound  Result Value Ref Range Status   Specimen Description   Final    WOUND Performed at [facility], [address], [place], [place] [address]    Special Requests  SPECIMEN D RIGHT KNEE LATERAL GUTTERS  Final   Gram Stain   Final    RARE  WBC PRESENT,BOTH PMN AND MONONUCLEAR NO ORGANISMS SEEN    Culture   Final    RARE STAPHYLOCOCCUS AUREUS NO ANAEROBES ISOLATED Performed at [facility], [place] [address], [place],  [address]    Report Status 01/30/2020 FINAL  Final   Organism ID, Bacteria STAPHYLOCOCCUS AUREUS  Final      Susceptibility   Staphylococcus aureus - MIC*    CIPROFLOXACIN <=0.5 SENSITIVE Sensitive     ERYTHROMYCIN >=8 RESISTANT Resistant     GENTAMICIN <=0.5 SENSITIVE Sensitive     OXACILLIN 0.5 SENSITIVE Sensitive     TETRACYCLINE <=1 SENSITIVE Sensitive     VANCOMYCIN <=0.5 SENSITIVE Sensitive     TRIMETH/SULFA <=10 SENSITIVE Sensitive     CLINDAMYCIN RESISTANT Resistant     RIFAMPIN <=0.5 SENSITIVE Sensitive     Inducible Clindamycin POSITIVE Resistant     * RARE STAPHYLOCOCCUS AUREUS  Gram stain     Status: None   Collection Time: 01/24/20 12:22 PM   Specimen: Wound  Result Value Ref Range Status   Specimen Description WOUND  Final   Special Requests E RIGHT KNEE MEDIAL GUTTEN  Final   Gram Stain   Final    FEW WBC SEEN NO ORGANISMS SEEN RESULT CALLED TO, READ BACK BY AND VERIFIED WITH: TERESA BENSON AT  1321 01/24/20 SDR Performed at [facility], [address], [place], [place] [address]    Report Status 01/24/2020 FINAL  Final  Aerobic/Anaerobic Culture (surgical/deep wound)     Status: None   Collection Time: 01/24/20 12:22 PM   Specimen: Wound  Result Value Ref Range Status   Specimen Description   Final    WOUND Performed at [facility], [address] [place], [place] [address]    Special Requests  SPECIMENE RIGHT KNEE MEDIAL GUTTEN  Final   Gram Stain   Final    FEW WBC PRESENT,BOTH PMN AND MONONUCLEAR NO ORGANISMS SEEN    Culture   Final    RARE STAPHYLOCOCCUS AUREUS NO ANAEROBES ISOLATED Performed at [facility], [place] [address] [place], [place] [address]    Report Status 01/30/2020 FINAL  Final   Organism ID, Bacteria  STAPHYLOCOCCUS AUREUS  Final      Susceptibility   Staphylococcus aureus - MIC*    CIPROFLOXACIN <=0.5 SENSITIVE Sensitive     ERYTHROMYCIN >=8 RESISTANT Resistant     GENTAMICIN <=0.5 SENSITIVE Sensitive     OXACILLIN <=0.25 SENSITIVE Sensitive     TETRACYCLINE <=1 SENSITIVE Sensitive     VANCOMYCIN 1 SENSITIVE Sensitive     TRIMETH/SULFA <=10 SENSITIVE Sensitive     CLINDAMYCIN RESISTANT Resistant     RIFAMPIN <=0.5 SENSITIVE Sensitive     Inducible Clindamycin POSITIVE Resistant     * RARE STAPHYLOCOCCUS AUREUS  Gram stain     Status: None   Collection Time: 01/24/20 12:24 PM   Specimen: Wound  Result Value Ref Range Status   Specimen Description WOUND  Final   Special Requests F RIGHT KNEE POSTERIOR RECESS  Final   Gram Stain   Final    MANY WBC SEEN NO ORGANISMS SEEN RESULT CALLED TO, READ BACK BY AND VERIFIED WITHCaralee Ates AT 2841 01/24/20 SDR Performed at [facility], [address], [place], [place] [address]    Report Status 01/24/2020 FINAL  Final  Aerobic/Anaerobic Culture (surgical/deep wound)     Status: None   Collection Time: 01/24/20 12:24 PM   Specimen: Wound  Result Value Ref Range Status   Specimen Description   Final    WOUND Performed at [facility], [address] [place], [place] [address]    Special Requests  SPECIMENF RIGHT KNEE POSTERIOI RECESS  Final   Gram Stain   Final    RARE WBC PRESENT, PREDOMINANTLY PMN RARE GRAM POSITIVE COCCI IN PAIRS    Culture   Final    RARE STAPHYLOCOCCUS AUREUS NO ANAEROBES ISOLATED Performed at [facility], [place] [address], [place], [place] [address]    Report Status 01/30/2020 FINAL  Final   Organism ID, Bacteria STAPHYLOCOCCUS AUREUS  Final      Susceptibility   Staphylococcus aureus - MIC*    CIPROFLOXACIN <=0.5 SENSITIVE Sensitive     ERYTHROMYCIN >=8 RESISTANT Resistant     GENTAMICIN <=0.5 SENSITIVE Sensitive     OXACILLIN 0.5 SENSITIVE Sensitive     TETRACYCLINE  <=1 SENSITIVE Sensitive     VANCOMYCIN 1 SENSITIVE Sensitive     TRIMETH/SULFA <=10 SENSITIVE Sensitive     CLINDAMYCIN RESISTANT Resistant     RIFAMPIN <=0.5 SENSITIVE Sensitive     Inducible Clindamycin POSITIVE Resistant     * RARE STAPHYLOCOCCUS AUREUS  Urine culture     Status: None   Collection Time: 01/24/20  2:49 PM   Specimen:  Urine, Catheterized  Result Value Ref Range Status   Specimen Description   Final    URINE, CATHETERIZED Performed at [facility], [address], [place], [place] [address]    Special Requests   Final    NONE Performed at [facility], [address] [place], [place] [address]    Culture   Final    NO GROWTH Performed at [facility], [place] [address], [place], [place] [address]    Report Status 01/25/2020 FINAL  Final     Labs: BNP (last 3 results) Recent Labs    02/20/19 0955  BNP 78.6   Basic Metabolic Panel: Recent Labs  Lab 01/25/20 0450 01/26/20 0550 01/28/20 0621 01/29/20 0530  NA 139 136 137 137  K 4.2 3.8 3.7 3.6  CL 106 103 103 102  CO2 26 26 27 25   GLUCOSE 115* 103* 107* 117*  BUN 10 11 11 15   CREATININE 0.80 0.66 0.65 0.70  CALCIUM 9.7 10.3 10.2 10.1  MG 1.8 2.1 2.0 2.0  PHOS 3.3 2.9 3.2 3.6   Liver Function Tests: Recent Labs  Lab 01/28/20 0621  AST 11*  ALT 9  ALKPHOS 98  BILITOT 0.6  PROT 5.9*  ALBUMIN 2.3*   No results for input(s): LIPASE, AMYLASE in the last 168 hours. No results for input(s): AMMONIA in the last 168 hours. CBC: Recent Labs  Lab 01/25/20 0450 01/26/20 0550 01/27/20 0524 01/28/20 0621 01/29/20 0530  WBC 11.7* 10.5  --   --  8.7  HGB 8.3* 8.5* 8.2* 8.5* 8.2*  HCT 27.8* 28.8* 27.9* 28.2* 27.8*  MCV 72.6* 73.3*  --   --  73.7*  PLT 492* 485*  --   --  439*   Cardiac Enzymes: No results for input(s): CKTOTAL, CKMB, CKMBINDEX, TROPONINI in the last 168 hours. BNP: Invalid input(s): POCBNP CBG: No results for input(s): GLUCAP in the last 168  hours. D-Dimer No results for input(s): DDIMER in the last 72 hours. Hgb A1c No results for input(s): HGBA1C in the last 72 hours. Lipid Profile No results for input(s): CHOL, HDL, LDLCALC, TRIG, CHOLHDL, LDLDIRECT in the last 72 hours. Thyroid function studies No results for input(s): TSH, T4TOTAL, T3FREE, THYROIDAB in the last 72 hours.  Invalid input(s): FREET3 Anemia work up No results for input(s): VITAMINB12, FOLATE, FERRITIN, TIBC, IRON, RETICCTPCT in the last 72 hours. Urinalysis    Component Value Date/Time   COLORURINE STRAW (A) 01/24/2020 1449   APPEARANCEUR HAZY (A) 01/24/2020 1449   LABSPEC 1.005 01/24/2020 1449   PHURINE 5.0 01/24/2020 1449   GLUCOSEU NEGATIVE 01/24/2020 1449   HGBUR NEGATIVE 01/24/2020 1449   BILIRUBINUR NEGATIVE 01/24/2020 1449   KETONESUR NEGATIVE 01/24/2020 1449   PROTEINUR NEGATIVE 01/24/2020 1449   NITRITE NEGATIVE 01/24/2020 1449   LEUKOCYTESUR SMALL (A) 01/24/2020 1449   Sepsis Labs Invalid input(s): PROCALCITONIN,  WBC,  LACTICIDVEN Microbiology Recent Results (from the past 240 hour(s))  Aerobic Culture (superficial specimen)     Status: None   Collection Time: 01/22/20  1:50 PM   Specimen: KNEE  Result Value Ref Range Status   Specimen Description   Final    KNEE Performed at [facility], [address], [place], [place] [address]    Special Requests   Final    NONE Performed at [facility], [address] [place], [place] [address]    Gram Stain   Final    RARE WBC PRESENT,BOTH PMN AND MONONUCLEAR NO  ORGANISMS SEEN    Culture   Final    RARE NORMAL SKIN FLORA Performed at [facility], [place] [address], [place], [place] [address]    Report Status 01/25/2020 FINAL  Final  Culture, blood (Routine x 2)     Status: None   Collection Time: 01/22/20  7:15 PM   Specimen: BLOOD  Result Value Ref Range Status   Specimen Description BLOOD BLOOD RIGHT HAND  Final   Special Requests   Final     BOTTLES DRAWN AEROBIC ONLY Blood Culture results may not be optimal due to an inadequate volume of blood received in culture bottles   Culture   Final    NO GROWTH 5 DAYS Performed at [facility], [address] [place], [place] [address]    Report Status 01/27/2020 FINAL  Final  Culture, blood (Routine x 2)     Status: None   Collection Time: 01/22/20 10:02 PM   Specimen: BLOOD  Result Value Ref Range Status   Specimen Description BLOOD RIGHT ANTECUBITAL  Final   Special Requests   Final    BOTTLES DRAWN AEROBIC AND ANAEROBIC Blood Culture adequate volume   Culture   Final    NO GROWTH 5 DAYS Performed at [facility], [address] [place], [place] [address]    Report Status 01/27/2020 FINAL  Final  Resp Panel by RT-PCR (Flu A&B, Covid) Nasopharyngeal Swab     Status: None   Collection Time: 01/22/20 10:02 PM   Specimen: Nasopharyngeal Swab; Nasopharyngeal(NP) swabs in vial transport medium  Result Value Ref Range Status   SARS Coronavirus 2 by RT PCR NEGATIVE NEGATIVE Final    Comment: (NOTE) SARS-CoV-2 target nucleic acids are NOT DETECTED.  The SARS-CoV-2 RNA is generally detectable in upper respiratory specimens during the acute phase of infection. The lowest concentration of SARS-CoV-2 viral copies this assay can detect is 138 copies/mL. A negative result does not preclude SARS-Cov-2 infection and should not be used as the sole basis for treatment or other patient management decisions. A negative result may occur with  improper specimen collection/handling, submission of specimen other than nasopharyngeal swab, presence of viral mutation(s) within the areas targeted by this assay, and inadequate number of viral copies(<138 copies/mL). A negative result must be combined with clinical observations, patient history, and epidemiological information. The expected result is Negative.  Fact Sheet for Patients:   EntrepreneurPulse.com.au  Fact Sheet for Healthcare Providers:  IncredibleEmployment.be  This test is no t yet approved or cleared by the [place] FDA and  has been authorized for detection and/or diagnosis of SARS-CoV-2 by FDA under an Emergency Use Authorization (EUA). This EUA will remain  in effect (meaning this test can be used) for the duration of the COVID-19 declaration under Section 564(b)(1) of the Act, 21 U.S.C.section 360bbb-3(b)(1), unless the authorization is terminated  or revoked sooner.       Influenza A by PCR NEGATIVE NEGATIVE Final   Influenza B by PCR NEGATIVE NEGATIVE Final    Comment: (NOTE) The Xpert Xpress SARS-CoV-2/FLU/RSV plus assay is intended as an aid in the diagnosis of influenza from Nasopharyngeal swab specimens and should not be used as a sole basis for treatment. Nasal washings and aspirates are unacceptable for Xpert Xpress SARS-CoV-2/FLU/RSV testing.  Fact Sheet for Patients: EntrepreneurPulse.com.au  Fact Sheet for Healthcare Providers: IncredibleEmployment.be  This test is not yet approved or cleared by the [place] FDA and has been authorized for detection and/or diagnosis  of SARS-CoV-2 by FDA under an Emergency Use Authorization (EUA). This EUA will remain in effect (meaning this test can be used) for the duration of the COVID-19 declaration under Section 564(b)(1) of the Act, 21 U.S.C. section 360bbb-3(b)(1), unless the authorization is terminated or revoked.  Performed at [facility], [address], [place], [place] [address]   Aerobic Culture (superficial specimen)     Status: None   Collection Time: 01/23/20  7:02 PM   Specimen: Wound  Result Value Ref Range Status   Specimen Description   Final    WOUND Performed at [facility], [address], [place], [place] [address]    Special Requests   Final    NONE Performed  at [facility], [address] [place], [place] [address]    Gram Stain   Final    FEW WBC PRESENT, PREDOMINANTLY PMN NO ORGANISMS SEEN Performed at [facility], [place] [address] [place], [place] [address]    Culture RARE STAPHYLOCOCCUS AUREUS  Final   Report Status 01/28/2020 FINAL  Final   Organism ID, Bacteria STAPHYLOCOCCUS AUREUS  Final      Susceptibility   Staphylococcus aureus - MIC*    CIPROFLOXACIN <=0.5 SENSITIVE Sensitive     ERYTHROMYCIN >=8 RESISTANT Resistant     GENTAMICIN <=0.5 SENSITIVE Sensitive     OXACILLIN 0.5 SENSITIVE Sensitive     TETRACYCLINE <=1 SENSITIVE Sensitive     VANCOMYCIN 1 SENSITIVE Sensitive     TRIMETH/SULFA <=10 SENSITIVE Sensitive     CLINDAMYCIN RESISTANT Resistant     RIFAMPIN <=0.5 SENSITIVE Sensitive     Inducible Clindamycin POSITIVE Resistant     * RARE STAPHYLOCOCCUS AUREUS  Anaerobic culture     Status: None   Collection Time: 01/24/20 11:43 AM   Specimen: PATH Other; Wound  Result Value Ref Range Status   Specimen Description SYNOVIAL RIGHT KNEE  Final   Special Requests RECEIVED IN STERILE CUP  Final   Gram Stain   Final    ABUNDANT WBC PRESENT,BOTH PMN AND MONONUCLEAR NO ORGANISMS SEEN    Culture   Final    NO ANAEROBES ISOLATED Performed at [facility], [address], [place], [place] [address]    Report Status 01/30/2020 FINAL  Final  Gram stain     Status: None   Collection Time: 01/24/20 11:43 AM   Specimen: PATH Other; Wound  Result Value Ref Range Status   Specimen Description WOUND  Final   Special Requests NONE  Final   Gram Stain   Final    MODERATE WBC SEEN NO ORGANISMS SEEN RESULT CALLED TO, READ BACK BY AND VERIFIED WITH: Caralee Ates AT 1218 01/24/20 SDR Performed at [facility], [address] [place], [place] [address]    Report Status 01/24/2020 FINAL  Final  Aerobic/Anaerobic Culture (surgical/deep wound)     Status: None   Collection Time: 01/24/20 11:43 AM    Specimen: Synovial, Right Knee; Body Fluid  Result Value Ref Range Status   Specimen Description WOUND RIGHT KNEE  Final   Special Requests SUPERFICIAL SWAB  Final   Gram Stain   Final    RARE WBC PRESENT, PREDOMINANTLY MONONUCLEAR RARE GRAM POSITIVE COCCI IN CLUSTERS    Culture   Final    RARE STAPHYLOCOCCUS AUREUS NO ANAEROBES ISOLATED Performed at [facility], [place] [address] [place],  [address]    Report Status 01/30/2020 FINAL  Final   Organism  ID, Bacteria STAPHYLOCOCCUS AUREUS  Final      Susceptibility   Staphylococcus aureus - MIC*    CIPROFLOXACIN <=0.5 SENSITIVE Sensitive     ERYTHROMYCIN >=8 RESISTANT Resistant     GENTAMICIN <=0.5 SENSITIVE Sensitive     OXACILLIN 0.5 SENSITIVE Sensitive     TETRACYCLINE <=1 SENSITIVE Sensitive     VANCOMYCIN 1 SENSITIVE Sensitive     TRIMETH/SULFA <=10 SENSITIVE Sensitive     CLINDAMYCIN RESISTANT Resistant     RIFAMPIN <=0.5 SENSITIVE Sensitive     Inducible Clindamycin POSITIVE Resistant     * RARE STAPHYLOCOCCUS AUREUS  Body fluid culture     Status: None   Collection Time: 01/24/20 11:43 AM   Specimen: Synovium  Result Value Ref Range Status   Specimen Description SYNOVIAL FLUID RIGHT KNEE  Final   Special Requests RECEIVED IN A CUP  Final   Gram Stain   Final    ABUNDANT WBC PRESENT, PREDOMINANTLY PMN NO ORGANISMS SEEN    Culture   Final    RARE STAPHYLOCOCCUS AUREUS CRITICAL RESULT CALLED TO, READ BACK BY AND VERIFIED WITH: PHARMD L KLUTTZ [id number] AT 903 BY CM Performed at [facility], [place] [address] [place], [place] [address]    Report Status 01/28/2020 FINAL  Final   Organism ID, Bacteria STAPHYLOCOCCUS AUREUS  Final      Susceptibility   Staphylococcus aureus - MIC*    CIPROFLOXACIN <=0.5 SENSITIVE Sensitive     ERYTHROMYCIN >=8 RESISTANT Resistant     GENTAMICIN <=0.5 SENSITIVE Sensitive     OXACILLIN 0.5 SENSITIVE Sensitive     TETRACYCLINE <=1 SENSITIVE Sensitive     VANCOMYCIN 1  SENSITIVE Sensitive     TRIMETH/SULFA <=10 SENSITIVE Sensitive     CLINDAMYCIN RESISTANT Resistant     RIFAMPIN <=0.5 SENSITIVE Sensitive     Inducible Clindamycin POSITIVE Resistant     * RARE STAPHYLOCOCCUS AUREUS  Acid Fast Smear (AFB)     Status: None   Collection Time: 01/24/20 11:48 AM   Specimen: Synovial, Right Knee; Body Fluid  Result Value Ref Range Status   AFB Specimen Processing Concentration  Final   Acid Fast Smear Negative  Final    Comment: (NOTE) Performed At: [facility] [place], [place] [id number] Rush Farmer MD GD:[id number]    Source (AFB) WOUND  Final    Comment: Performed at [facility], [address] [place], [place] [address]  Gram stain     Status: None   Collection Time: 01/24/20 12:15 PM   Specimen: Wound  Result Value Ref Range Status   Specimen Description WOUND  Final   Special Requests C RIGHT KNEE  SUPRA PATELLAR POUCH   Final   Gram Stain   Final    MODERATE WBC SEEN NO ORGANISMS SEEN RESULT CALLED TO, READ BACK BY AND VERIFIED WITHCaralee Ates AT 7989 01/24/20 SDR Performed at [facility], [address], [place], [place] [address]    Report Status 01/24/2020 FINAL  Final  Aerobic/Anaerobic Culture (surgical/deep wound)     Status: None   Collection Time: 01/24/20 12:15 PM   Specimen: Wound  Result Value Ref Range Status   Specimen Description WOUND RIGHT KNEE  Final   Special Requests SPECIMEN C SUPRA PATELLAR POUCH  Final   Gram Stain RARE NO WBC SEEN NO ORGANISMS SEEN   Final   Culture   Final    RARE STAPHYLOCOCCUS AUREUS CORRECTED RESULTS PREVIOUSLY REPORTED AS:  STAPHYLOCOCCUS CAPITIS CORRECTED RESULTS CALLED TO: PHARMD L KLUTTZ [id number] AT 2244 BY CM NO ANAEROBES ISOLATED Performed at [facility], [place] [address], [place], [place] [address]    Report Status 01/30/2020 FINAL  Final   Organism ID, Bacteria STAPHYLOCOCCUS AUREUS  Final      Susceptibility    Staphylococcus aureus - MIC*    CIPROFLOXACIN <=0.5 SENSITIVE Sensitive     ERYTHROMYCIN >=8 RESISTANT Resistant     GENTAMICIN <=0.5 SENSITIVE Sensitive     OXACILLIN <=0.25 SENSITIVE Sensitive     TETRACYCLINE <=1 SENSITIVE Sensitive     VANCOMYCIN <=0.5 SENSITIVE Sensitive     TRIMETH/SULFA <=10 SENSITIVE Sensitive     CLINDAMYCIN RESISTANT Resistant     RIFAMPIN <=0.5 SENSITIVE Sensitive     Inducible Clindamycin POSITIVE Resistant     * RARE STAPHYLOCOCCUS AUREUS  Gram stain     Status: None   Collection Time: 01/24/20 12:20 PM   Specimen: Wound  Result Value Ref Range Status   Specimen Description WOUND  Final   Special Requests D RIGHT  KNEE LATERAL GUTTEN  Final   Gram Stain   Final    FEW WBC SEEN NO ORGANISMS SEEN TERESA BENSON AT 0511 01/24/20 SDR Performed at [facility], [address] [place], [place] [address]    Report Status 01/24/2020 FINAL  Final  Aerobic/Anaerobic Culture (surgical/deep wound)     Status: None   Collection Time: 01/24/20 12:20 PM   Specimen: Wound  Result Value Ref Range Status   Specimen Description   Final    WOUND Performed at [facility], [address] [place], [place] [address]    Special Requests  SPECIMEN D RIGHT KNEE LATERAL GUTTERS  Final   Gram Stain   Final    RARE WBC PRESENT,BOTH PMN AND MONONUCLEAR NO ORGANISMS SEEN    Culture   Final    RARE STAPHYLOCOCCUS AUREUS NO ANAEROBES ISOLATED Performed at [facility], [place] [address], [place], [place] [address]    Report Status 01/30/2020 FINAL  Final   Organism ID, Bacteria STAPHYLOCOCCUS AUREUS  Final      Susceptibility   Staphylococcus aureus - MIC*    CIPROFLOXACIN <=0.5 SENSITIVE Sensitive     ERYTHROMYCIN >=8 RESISTANT Resistant     GENTAMICIN <=0.5 SENSITIVE Sensitive     OXACILLIN 0.5 SENSITIVE Sensitive     TETRACYCLINE <=1 SENSITIVE Sensitive     VANCOMYCIN <=0.5 SENSITIVE Sensitive     TRIMETH/SULFA <=10 SENSITIVE Sensitive      CLINDAMYCIN RESISTANT Resistant     RIFAMPIN <=0.5 SENSITIVE Sensitive     Inducible Clindamycin POSITIVE Resistant     * RARE STAPHYLOCOCCUS AUREUS  Gram stain     Status: None   Collection Time: 01/24/20 12:22 PM   Specimen: Wound  Result Value Ref Range Status   Specimen Description WOUND  Final   Special Requests E RIGHT KNEE MEDIAL GUTTEN  Final   Gram Stain   Final    FEW WBC SEEN NO ORGANISMS SEEN RESULT CALLED TO, READ BACK BY AND VERIFIED WITHCaralee Ates AT 3013 01/24/20 SDR Performed at [facility], [address] [place], [place] [address]    Report Status 01/24/2020 FINAL  Final  Aerobic/Anaerobic Culture (surgical/deep wound)     Status: None   Collection Time: 01/24/20 12:22 PM   Specimen: Wound  Result Value Ref Range Status   Specimen Description   Final    WOUND  Performed at [facility], [place] [place], [place] [address]    Special Requests  SPECIMENE RIGHT KNEE MEDIAL GUTTEN  Final   Gram Stain   Final    FEW WBC PRESENT,BOTH PMN AND MONONUCLEAR NO ORGANISMS SEEN    Culture   Final    RARE STAPHYLOCOCCUS AUREUS NO ANAEROBES ISOLATED Performed at [facility], [place] [address] [place], [place] [address]    Report Status 01/30/2020 FINAL  Final   Organism ID, Bacteria STAPHYLOCOCCUS AUREUS  Final      Susceptibility   Staphylococcus aureus - MIC*    CIPROFLOXACIN <=0.5 SENSITIVE Sensitive     ERYTHROMYCIN >=8 RESISTANT Resistant     GENTAMICIN <=0.5 SENSITIVE Sensitive     OXACILLIN <=0.25 SENSITIVE Sensitive     TETRACYCLINE <=1 SENSITIVE Sensitive     VANCOMYCIN 1 SENSITIVE Sensitive     TRIMETH/SULFA <=10 SENSITIVE Sensitive     CLINDAMYCIN RESISTANT Resistant     RIFAMPIN <=0.5 SENSITIVE Sensitive     Inducible Clindamycin POSITIVE Resistant     * RARE STAPHYLOCOCCUS AUREUS  Gram stain     Status: None   Collection Time: 01/24/20 12:24 PM   Specimen: Wound  Result Value Ref Range Status   Specimen  Description WOUND  Final   Special Requests F RIGHT KNEE POSTERIOR RECESS  Final   Gram Stain   Final    MANY WBC SEEN NO ORGANISMS SEEN RESULT CALLED TO, READ BACK BY AND VERIFIED WITHCaralee Ates AT 7628 01/24/20 SDR Performed at [facility], [address], [place], [place] [address]    Report Status 01/24/2020 FINAL  Final  Aerobic/Anaerobic Culture (surgical/deep wound)     Status: None   Collection Time: 01/24/20 12:24 PM   Specimen: Wound  Result Value Ref Range Status   Specimen Description   Final    WOUND Performed at [facility], [address], [place], [place] [address]    Special Requests  SPECIMENF RIGHT KNEE POSTERIOI RECESS  Final   Gram Stain   Final    RARE WBC PRESENT, PREDOMINANTLY PMN RARE GRAM POSITIVE COCCI IN PAIRS    Culture   Final    RARE STAPHYLOCOCCUS AUREUS NO ANAEROBES ISOLATED Performed at [facility], [place] [address] [place], [place] [address]    Report Status 01/30/2020 FINAL  Final   Organism ID, Bacteria STAPHYLOCOCCUS AUREUS  Final      Susceptibility   Staphylococcus aureus - MIC*    CIPROFLOXACIN <=0.5 SENSITIVE Sensitive     ERYTHROMYCIN >=8 RESISTANT Resistant     GENTAMICIN <=0.5 SENSITIVE Sensitive     OXACILLIN 0.5 SENSITIVE Sensitive     TETRACYCLINE <=1 SENSITIVE Sensitive     VANCOMYCIN 1 SENSITIVE Sensitive     TRIMETH/SULFA <=10 SENSITIVE Sensitive     CLINDAMYCIN RESISTANT Resistant     RIFAMPIN <=0.5 SENSITIVE Sensitive     Inducible Clindamycin POSITIVE Resistant     * RARE STAPHYLOCOCCUS AUREUS  Urine culture     Status: None   Collection Time: 01/24/20  2:49 PM   Specimen: Urine, Catheterized  Result Value Ref Range Status   Specimen Description   Final    URINE, CATHETERIZED Performed at [facility], [address], [place], [place] [address]    Special Requests   Final    NONE Performed at [facility], [address], [place], [place] [address]     Culture   Final  NO GROWTH Performed at [facility], [place] [address] [place], [place] [address]    Report Status 01/25/2020 FINAL  Final    Please note: You were cared for by a hospitalist during your hospital stay. Once you are discharged, your primary care physician will handle any further medical issues. Please note that NO REFILLS for any discharge medications will be authorized once you are discharged, as it is imperative that you return to your primary care physician (or establish a relationship with a primary care physician if you do not have one) for your post hospital discharge needs so that they can reassess your need for medications and monitor your lab values.    Time coordinating discharge: 40 minutes  SIGNED:   Shelly Coss, MD  [facility] 01/31/2020, 2:56 PM Pager [phone]  If 7PM-7AM, please contact night-coverage [email or web address] Password [email or web address]

## 2020-02-01 NOTE — Progress Notes (Signed)
Yates Decamp to be D/C'd home per MD order.  Discussed prescriptions and follow up appointments with the patient. Prescriptions given to patient, medication list explained in detail. Pt verbalized understanding.  Allergies as of 02/01/2020       Reactions   Celecoxib Nausea Only   Hydrocodone-acetaminophen Nausea And Vomiting   Meloxicam Nausea Only   Morphine Sulfate Er Beads Itching   Naproxen Swelling   Oxycodone Other (See Comments)   disorientation   Rofecoxib Nausea Only   Sulfa Antibiotics Other (See Comments)   Reaction: unknown   Ciprofloxacin Rash   Nickel Rash   Tramadol-acetaminophen Nausea Only, Rash   Per pt she takes this when PT comes only makes her feel "Loopy" 11/08/2019        Medication List     STOP taking these medications    doxycycline 100 MG capsule Commonly known as: MONODOX   nitrofurantoin (macrocrystal-monohydrate) 100 MG capsule Commonly known as: MACROBID       TAKE these medications    albuterol 108 (90 Base) MCG/ACT inhaler Commonly known as: VENTOLIN HFA Inhale 2 puffs into the lungs every 4 (four) hours as needed.   alum & mag hydroxide-simeth 200-200-20 MG/5ML suspension Commonly known as: MAALOX/MYLANTA Take 30 mLs by mouth every 4 (four) hours as needed for indigestion.   aspirin EC 81 MG tablet Take 81 mg by mouth daily. Swallow whole.   CALAZIME SKIN PROTECTANT EX Apply 1 application topically 4 (four) times daily as needed (diaper changes).   Calcium-Vitamin D 600-200 MG-UNIT tablet Take 2 tablets by mouth daily.   carvedilol 3.125 MG tablet Commonly known as: COREG Take 3.125 mg by mouth 2 (two) times daily.   ceFAZolin  IVPB Commonly known as: ANCEF Inject 2 g into the vein every 8 (eight) hours. Indication:  MSSA R knee PJI First Dose: Yes Last Day of Therapy:  03/06/2020 Labs - Once weekly on MONDAYs:  CBC/D, CMP, ESR and CRP Method of administration: IV Push Method of administration may be changed at the  discretion of home infusion pharmacist based upon assessment of the patient and/or caregiver's ability to self-administer the medication ordered.   cetirizine 10 MG tablet Commonly known as: ZYRTEC Take 10 mg by mouth daily.   enoxaparin 40 MG/0.4ML injection Commonly known as: LOVENOX Inject 0.4 mLs (40 mg total) into the skin daily for 14 days.   escitalopram 10 MG tablet Commonly known as: LEXAPRO Take 10 mg by mouth daily.   hydroxypropyl methylcellulose / hypromellose 2.5 % ophthalmic solution Commonly known as: ISOPTO TEARS / GONIOVISC Place 1 drop into both eyes 3 (three) times daily as needed for dry eyes.   multivitamin with minerals Tabs tablet Take 1 tablet by mouth daily.   nystatin ointment Commonly known as: MYCOSTATIN Apply 1 application topically daily as needed for irritation.   ondansetron 4 MG tablet Commonly known as: ZOFRAN Take 1 tablet (4 mg total) by mouth every 6 (six) hours as needed for nausea.   pantoprazole 40 MG tablet Commonly known as: PROTONIX Take 1 tablet (40 mg total) by mouth 2 (two) times daily before a meal. What changed: when to take this   polyethylene glycol 17 g packet Commonly known as: MiraLax Take 17 g by mouth daily as needed.   PRESERVISION AREDS 2 PO Take 1 tablet by mouth 2 (two) times daily.   rifampin 300 MG capsule Commonly known as: Rifadin Take 1 capsule (300 mg total) by mouth 2 (two) times daily.  traMADol 50 MG tablet Commonly known as: Ultram Take 1 tablet (50 mg total) by mouth every 6 (six) hours as needed.               Durable Medical Equipment  (From admission, onward)           Start     Ordered   01/24/20 1720  DME Walker rolling  Once       Question:  Patient needs a walker to treat with the following condition  Answer:  Total knee replacement status   01/24/20 1720   01/24/20 1720  DME Bedside commode  Once       Question:  Patient needs a bedside commode to treat with the  following condition  Answer:  Total knee replacement status   01/24/20 1720              Discharge Care Instructions  (From admission, onward)           Start     Ordered   01/31/20 0000  Change dressing on IV access line weekly and PRN  (Home infusion instructions - [facility] )        01/31/20 1455            Vitals:   01/31/20 2333 02/01/20 0426  BP: (!) 110/91 139/64  Pulse: 83 99  Resp: 18 20  Temp: 98 F (36.7 C) 98.4 F (36.9 C)  SpO2:  95%    Skin clean, dry and intact except for surgical incisions on Right ankle and right knee.  IV catheter discontinued intact. Site without signs and symptoms of complications. Dressing and pressure applied. Pt denies pain at this time. No complaints noted.  An After Visit Summary was printed and given to the patient. Patient escorted via [place], and D/C home via EMS.  Lebo A Elex Mainwaring

## 2020-02-01 NOTE — Progress Notes (Signed)
PT Cancellation Note  Patient Details Name: Jasmine Morrow MRN: [id number] DOB: [birth year]   Cancelled Treatment:    Reason Eval/Treat Not Completed: Other (comment)   EMS has been called and awaiting transport.  Pt declined further intervention from PT.   Danielle Dess 02/01/2020, 8:24 AM

## 2020-02-12 ENCOUNTER — Ambulatory Visit: Payer: Medicare Other | Admitting: Internal Medicine

## 2020-02-13 ENCOUNTER — Ambulatory Visit: Payer: Medicare Other | Attending: Infectious Diseases | Admitting: Infectious Diseases

## 2020-02-13 ENCOUNTER — Other Ambulatory Visit: Payer: Self-pay

## 2020-02-13 DIAGNOSIS — T8453XA Infection and inflammatory reaction due to internal right knee prosthesis, initial encounter: Secondary | ICD-10-CM | POA: Diagnosis not present

## 2020-02-13 NOTE — Progress Notes (Signed)
The purpose of this virtual visit is to provide medical care while limiting exposure to the novel coronavirus (COVID19) for both patient and office staff.   Consent was obtained for phone visit:  Yes.   Answered questions that patient had about telehealth interaction:  Yes.   I discussed the limitations, risks, security and privacy concerns of performing an evaluation and management service by telephone. I also discussed with the patient that there may be a patient responsible charge related to this service. The patient expressed understanding and agreed to proceed.   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 She had undergone arthrotomy with irrigation and poly-exchange on 01/24/20. She also has infected ORIF site rt ankle wih likely infected hardware. She is currently on cefazolin and rifampin. Rifampin has caused nausea and now is better with zofran- she takes rifampin after food- she has no vomiting, fever, rash. She is constipated ( her baseline) and usually does not have a bowel movt  For a week. She was asked to take miralax by her PCP every day. She said she will start that. PT  Comes twice week- his first visit was on Monday.  Pt walked 30 feet. Saw Dr.Hooten on 02/04/20.  Her home nurse removed some of the staples. Labs from 02/10/20 showed ESR > 100 and CRP > 20 Discussed the management with the patient She will continue cefazolin until 03/06/20. She will contiune Rifampin + keflex from 2/5 onwards for another 5 months. Will follow patient in 3 months  spent 22 min on the call

## 2020-02-17 ENCOUNTER — Telehealth: Payer: Self-pay

## 2020-02-17 ENCOUNTER — Other Ambulatory Visit: Payer: Self-pay

## 2020-02-17 MED ORDER — ONDANSETRON HCL 4 MG PO TABS: 4.0000 mg | ORAL_TABLET | Freq: Two times a day (BID) | ORAL | 0 refills | Status: DC

## 2020-02-17 NOTE — Telephone Encounter (Signed)
Sent in 20 tab zofran to take po bid prn.  Patient aware they are at [facility]

## 2020-02-25 ENCOUNTER — Telehealth: Payer: Self-pay

## 2020-02-25 NOTE — Telephone Encounter (Signed)
Advised Tim and Lynn at [facility] to [place] after last dose on 03/06/20.

## 2020-03-09 ENCOUNTER — Other Ambulatory Visit: Payer: Self-pay | Admitting: Infectious Diseases

## 2020-03-09 ENCOUNTER — Telehealth: Payer: Self-pay

## 2020-03-09 MED ORDER — RIFAMPIN 300 MG PO CAPS: 300.0000 mg | ORAL_CAPSULE | Freq: Two times a day (BID) | ORAL | 4 refills | Status: DC

## 2020-03-09 MED ORDER — CEFADROXIL 500 MG PO CAPS: 500.0000 mg | ORAL_CAPSULE | Freq: Two times a day (BID) | ORAL | 4 refills | Status: DC

## 2020-03-09 MED ORDER — ONDANSETRON HCL 4 MG PO TABS
4.0000 mg | ORAL_TABLET | Freq: Two times a day (BID) | ORAL | 1 refills | Status: DC | PRN
Start: 2020-03-09 — End: 2020-03-18

## 2020-03-09 NOTE — Telephone Encounter (Signed)
Patient left voicemail requesting refills on Rifampin and Zofran. Patient states that she is still experiencing nausea with oral antibiotics. [facility]. Patient does not currently have any medication on hand. Will forward message to MD to advise if okay to refill both medications.  Patient would also like to know if MD would like to do virtual appt before April. Patient states that Md wanted to follow up two weeks after last visit.

## 2020-03-09 NOTE — Progress Notes (Signed)
Sent prescription for cefadroxil 500mg  PO BID + rifampin 300mg  PO BID for prosthetic knee joint infection with MSSA and zofran 4 mg PrN BID. Spoke to the patent

## 2020-03-09 NOTE — Telephone Encounter (Signed)
Spoke to the patient and sent prescription for 2 antibiotics and zofran

## 2020-03-11 LAB — ACID FAST CULTURE WITH REFLEXED SENSITIVITIES (MYCOBACTERIA): Acid Fast Culture: NEGATIVE

## 2020-03-13 ENCOUNTER — Other Ambulatory Visit: Payer: Self-pay | Admitting: Infectious Diseases

## 2020-03-13 ENCOUNTER — Telehealth: Payer: Self-pay

## 2020-03-13 MED ORDER — DOXYCYCLINE HYCLATE 100 MG PO TABS: 100.0000 mg | ORAL_TABLET | Freq: Two times a day (BID) | ORAL | 0 refills | Status: DC

## 2020-03-13 NOTE — Progress Notes (Signed)
Pt cannot tolerate cefadroxil- says it caused her leg spasms- so will sip and sent prescription for doxy

## 2020-03-13 NOTE — Telephone Encounter (Signed)
Patient called office today with some concerns after starting Cefadroxil. States that she is experiencing spasms in legs at night, nausea, and head ache. Denies any medication changes other then antibiotics.  States symptoms have gotten worse the past new days. Has started zofran, but is not sure when she should take or if she could take same times as antbx. Patient would like to speak with MD to discuss concerns and review alternatives if any.

## 2020-03-13 NOTE — Telephone Encounter (Signed)
Spoke to patient and stopped cefadroxil- sent prescription for doxycylcine.

## 2020-03-17 ENCOUNTER — Telehealth: Payer: Self-pay

## 2020-03-17 NOTE — Telephone Encounter (Signed)
Will call her once I am done with office

## 2020-03-17 NOTE — Telephone Encounter (Signed)
Home Health nurse advised patient that Doxy will not treat the bacteria based off results from cultures done last week. Advised it is not on the list for abx that will work for her infection. Patient still having nausea and feels bad and does not want to remain on meds that she does not need. Please advise. She is almost out of the zofran and requests refill. Is taking twice daily.

## 2020-03-17 NOTE — Telephone Encounter (Signed)
Received call from Dr. Marry Guan with [facility] report patient is calling his office for advise regarding wound culture report.   Will make Dr Delaine Lame aware that report is available and [facility] office to contact patient with advise.  Eugenia Mcalpine

## 2020-03-17 NOTE — Telephone Encounter (Signed)
Culture results scanned into media from Dr Marry Guan.

## 2020-03-18 ENCOUNTER — Telehealth: Payer: Self-pay | Admitting: Infectious Diseases

## 2020-03-18 ENCOUNTER — Other Ambulatory Visit: Payer: Self-pay | Admitting: Infectious Diseases

## 2020-03-18 MED ORDER — ONDANSETRON HCL 4 MG PO TABS: 4.0000 mg | ORAL_TABLET | Freq: Two times a day (BID) | ORAL | 1 refills | Status: AC | PRN

## 2020-03-18 NOTE — Telephone Encounter (Signed)
Spoke to patient about the culture taken by Dr.hooten of the rt leg wound- it always had MSSA but this time it was pseudomonas which is a colonizer- so willnot treat currently- but will discuss with Dr.Hootne As no MSSA in the wound the current antibiotics she is taking for the knee infection and foot infection is working- Continue Doxy and rifampin- sent prescription for zofran as she xontinues ot have nausea

## 2020-03-19 ENCOUNTER — Telehealth: Payer: Self-pay

## 2020-03-19 NOTE — Telephone Encounter (Signed)
Patient called to verify meds. Advised Doxy and Rifampin is correct. [facility]

## 2020-03-26 ENCOUNTER — Other Ambulatory Visit: Payer: Self-pay | Admitting: Infectious Diseases

## 2020-03-26 MED ORDER — CIPROFLOXACIN HCL 500 MG PO TABS: 500.0000 mg | ORAL_TABLET | Freq: Two times a day (BID) | ORAL | 1 refills | Status: DC

## 2020-03-26 NOTE — Progress Notes (Signed)
Spoke to patient about pseudomonas putida in the culture from the leg wound done by Dr.Hooten- after discussing with Dr.Hooten decided to start ciprofloxacin Pt currently on Doxy and Rifampin for MSSA PJI of the rt knee Cipro will cover that as well- so Doxy can be stopped Cipro listed as allergy, pt says she may have had a mild chest rash many years ago and wants to take it and see.

## 2020-04-02 ENCOUNTER — Other Ambulatory Visit (INDEPENDENT_AMBULATORY_CARE_PROVIDER_SITE_OTHER): Payer: Self-pay | Admitting: Vascular Surgery

## 2020-04-02 DIAGNOSIS — S91001A Unspecified open wound, right ankle, initial encounter: Secondary | ICD-10-CM

## 2020-04-05 DIAGNOSIS — K219 Gastro-esophageal reflux disease without esophagitis: Secondary | ICD-10-CM | POA: Insufficient documentation

## 2020-04-05 DIAGNOSIS — L97919 Non-pressure chronic ulcer of unspecified part of right lower leg with unspecified severity: Secondary | ICD-10-CM | POA: Insufficient documentation

## 2020-04-05 DIAGNOSIS — I739 Peripheral vascular disease, unspecified: Secondary | ICD-10-CM | POA: Insufficient documentation

## 2020-04-05 DIAGNOSIS — J449 Chronic obstructive pulmonary disease, unspecified: Secondary | ICD-10-CM | POA: Insufficient documentation

## 2020-04-05 NOTE — Progress Notes (Signed)
MRN : [id number]  Jasmine Morrow is a 75 y.o. (25-Sep-1945) female who presents with chief complaint of No chief complaint on file. Marland Kitchen  History of Present Illness:   The patient is seen for evaluation of painful lower extremities and diminished pulses associated with ulceration of the foot.  The patient notes the ulcer has been present for multiple weeks and has not been improving.  It is very painful and has had some drainage.  No specific history of trauma noted by the patient.  The patient denies fever or chills.  the patient does have diabetes which has been difficult to control.  Patient notes prior to the ulcer developing the extremities were painful particularly with ambulation or activity and the discomfort is very consistent day today. Typically, the pain occurs at less than one block, progress is as activity continues to the point that the patient must stop walking. Resting including standing still for several minutes allowed resumption of the activity and the ability to walk a similar distance before stopping again. Uneven terrain and inclined shorten the distance. The pain has been progressive over the past several years.   The patient denies rest pain or dangling of an extremity off the side of the bed during the night for relief. No prior interventions or surgeries.  No history of back problems or DJD of the lumbar sacral spine.   The patient denies amaurosis fugax or recent TIA symptoms. There are no recent neurological changes noted. The patient denies history of DVT, PE or superficial thrombophlebitis. The patient denies recent episodes of angina or shortness of breath.   No outpatient medications have been marked as taking for the 04/06/20 encounter (Appointment) with Delana Meyer, Dolores Lory, MD.    Past Medical History:  Diagnosis Date  . Anxiety   . Asthma   . GERD (gastroesophageal reflux disease)   . Hypertension   . Seasonal allergies   . Sleep apnea     Past  Surgical History:  Procedure Laterality Date  . CESAREAN SECTION    . DILATION AND CURETTAGE OF UTERUS    . ESOPHAGOGASTRODUODENOSCOPY N/A 02/22/2019   Procedure: ESOPHAGOGASTRODUODENOSCOPY (EGD);  Surgeon: Toledo, Benay Pike, MD;  Location: [facility];  Service: Gastroenterology;  Laterality: N/A;  . FLEXIBLE SIGMOIDOSCOPY N/A 02/22/2019   Procedure: FLEXIBLE SIGMOIDOSCOPY;  Surgeon: Toledo, Benay Pike, MD;  Location: [facility];  Service: Gastroenterology;  Laterality: N/A;  . HARDWARE REMOVAL Right 11/12/2019   Procedure: Right ankle hardware removal;  Surgeon: Hessie Knows, MD;  Location: [facility];  Service: Orthopedics;  Laterality: Right;  . ORIF ANKLE FRACTURE Right 03/14/2019   Procedure: OPEN REDUCTION INTERNAL FIXATION (ORIF) ANKLE FRACTURE, MEDIAL MALLEOLUS;  Surgeon: Hessie Knows, MD;  Location: [facility];  Service: Orthopedics;  Laterality: Right;  . ORIF ANKLE FRACTURE Right 02/08/2019   Procedure: OPEN REDUCTION INTERNAL FIXATION (ORIF) ANKLE FRACTURE, post malleolus;  Surgeon: Hessie Knows, MD;  Location: [facility];  Service: Orthopedics;  Laterality: Right;  . REPLACEMENT TOTAL KNEE Bilateral 2008   2009  . SYNDESMOSIS REPAIR Right 02/08/2019   Procedure: SYNDESMOSIS REPAIR;  Surgeon: Hessie Knows, MD;  Location: [facility];  Service: Orthopedics;  Laterality: Right;  . TEE WITHOUT CARDIOVERSION N/A 02/26/2019   Procedure: TRANSESOPHAGEAL ECHOCARDIOGRAM (TEE);  Surgeon: Corey Skains, MD;  Location: [facility];  Service: Cardiovascular;  Laterality: N/A;  . TOTAL KNEE REVISION WITH SCAR DEBRIDEMENT/PATELLA REVISION WITH POLY EXCHANGE Right 01/24/2020   Procedure: TOTAL KNEE REVISION WITH SCAR DEBRIDEMENT/PATELLA REVISION WITH  POLY EXCHANGE;  Surgeon: Dereck Leep, MD;  Location: [facility];  Service: Orthopedics;  Laterality: Right;  . 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 Smoking status: Former Smoker     Packs/day: 2.00    Years: 30.00    Pack years: 60.00    Types: Cigarettes    Quit date: 10/02/1990    Years since quitting: 29.5  . Smokeless tobacco: Never Used  Vaping Use  . Vaping Use: Never used  Substance Use Topics  . Alcohol use: No    Alcohol/week: 0.0 standard drinks  . Drug use: No    Family History Family History  Problem Relation Age of Onset  . Bone cancer Mother   . COPD Father   No family history of bleeding/clotting disorders, porphyria or autoimmune disease   Allergies  Allergen Reactions  . Celecoxib Nausea Only  . Hydrocodone-Acetaminophen Nausea And Vomiting  . Meloxicam Nausea Only  . Morphine Sulfate Er Beads Itching  . Naproxen Swelling  . Oxycodone Other (See Comments)    disorientation  . Rofecoxib Nausea Only  . Sulfa Antibiotics Other (See Comments)    Reaction: unknown  . Ciprofloxacin Rash    Pt wants to take cipro 03/26/20 as she thinks the rash was insignificant and many years ago  . Nickel Rash  . Tramadol-Acetaminophen Nausea Only and Rash    Per pt she takes this when PT comes only makes her feel "Loopy" 11/08/2019     REVIEW OF SYSTEMS (Negative unless checked)  Constitutional: [] Weight loss  [] Fever  [] Chills Cardiac: [] Chest pain   [] Chest pressure   [] Palpitations   [] Shortness of breath when laying flat   [] Shortness of breath with exertion. Vascular:  [x] Pain in legs with walking   [] Pain in legs at rest  [] History of DVT   [] Phlebitis   [x] Swelling in legs   [] Varicose veins   [x] Non-healing ulcers Pulmonary:   [] Uses home oxygen   [] Productive cough   [] Hemoptysis   [] Wheeze  [] COPD   [] Asthma Neurologic:  [] Dizziness   [] Seizures   [] History of stroke   [] History of TIA  [] Aphasia   [] Vissual changes   [] Weakness or numbness in arm   [x] Weakness or numbness in leg Musculoskeletal:   [] Joint swelling   [x] Joint pain   [] Low back pain Hematologic:  [] Easy bruising  [] Easy bleeding   [] Hypercoagulable state    [] Anemic Gastrointestinal:  [] Diarrhea   [] Vomiting  [x] Gastroesophageal reflux/heartburn   [] Difficulty swallowing. Genitourinary:  [] Chronic kidney disease   [] Difficult urination  [] Frequent urination   [] Blood in urine Skin:  [] Rashes   [x] Ulcers  Psychological:  [] History of anxiety   []  History of major depression.  Physical Examination  There were no vitals filed for this visit. There is no height or weight on file to calculate BMI. Gen: WD/WN, NAD Head: [place]/AT, No temporalis wasting.  Ear/Nose/Throat: Hearing grossly intact, nares w/o erythema or drainage, poor dentition Eyes: PER, EOMI, sclera nonicteric.  Neck: Supple, no masses.  No bruit or JVD.  Pulmonary:  Good air movement, clear to auscultation bilaterally, no use of accessory muscles.  Cardiac: RRR, normal S1, S2, no Murmurs. Vascular: Ulcer anterior ankle Vessel Right Left  Radial Palpable Palpable  PT Not Palpable Palpable  DP Not Palpable Palpable  Gastrointestinal: soft, non-distended. No guarding/no peritoneal signs.  Musculoskeletal: M/S 5/5 throughout.  No deformity or atrophy.  Neurologic:  CN 2-12 intact. Pain and light touch intact in extremities.  Symmetrical.  Speech is fluent. Motor exam as listed above. Psychiatric: Judgment intact, Mood & affect appropriate for pt's clinical situation. Dermatologic: No rashes + ulcers noted.  No changes consistent with cellulitis. Lymph : No Cervical lymphadenopathy, no lichenification or skin changes of chronic lymphedema.  CBC Lab Results  Component Value Date   WBC 8.7 01/29/2020   HGB 8.2 (L) 01/29/2020   HCT 27.8 (L) 01/29/2020   MCV 73.7 (L) 01/29/2020   PLT 439 (H) 01/29/2020    BMET    Component Value Date/Time   NA 137 01/29/2020 0530   K 3.6 01/29/2020 0530   CL 102 01/29/2020 0530   CO2 25 01/29/2020 0530   GLUCOSE 117 (H) 01/29/2020 0530   BUN 15 01/29/2020 0530   CREATININE 0.70 01/29/2020 0530   CALCIUM 10.1 01/29/2020 0530   GFRNONAA >60  01/29/2020 0530   GFRAA >60 03/01/2019 0457   CrCl cannot be calculated (Patient's most recent lab result is older than the maximum 21 days allowed.).  COAG Lab Results  Component Value Date   INR 1.2 01/22/2020   INR 1.4 (H) 02/21/2019    Radiology No results found.   Assessment/Plan 1. PAD (peripheral artery disease) (HCC) Recommend:  I do not find evidence of life style limiting vascular disease. The patient specifically denies life style limitation.  Previous noninvasive studies including ABI's of the legs do not identify critical vascular problems.  The patient should continue walking and begin a more formal exercise program. The patient should continue his antiplatelet therapy and aggressive treatment of the lipid abnormalities.  Patient will follow-up with me on a PRN basis   2. Ulcer of right lower extremity with fat layer exposed ([place]) This does not appear to be related to her vascular status  3. Chronic obstructive pulmonary disease, unspecified COPD type ([place]) Continue pulmonary medications and aerosols as already ordered, these medications have been reviewed and there are no changes at this time.    4. Gastroesophageal reflux disease without esophagitis Continue PPI as already ordered, this medication has been reviewed and there are no changes at this time.  Avoidence of caffeine and alcohol  Moderate elevation of the head of the bed     Hortencia Pilar, MD  04/05/2020 9:16 PM

## 2020-04-06 ENCOUNTER — Ambulatory Visit (INDEPENDENT_AMBULATORY_CARE_PROVIDER_SITE_OTHER): Payer: Medicare Other | Admitting: Vascular Surgery

## 2020-04-06 ENCOUNTER — Other Ambulatory Visit: Payer: Self-pay

## 2020-04-06 ENCOUNTER — Ambulatory Visit (INDEPENDENT_AMBULATORY_CARE_PROVIDER_SITE_OTHER): Payer: Medicare Other

## 2020-04-06 ENCOUNTER — Encounter (INDEPENDENT_AMBULATORY_CARE_PROVIDER_SITE_OTHER): Payer: Self-pay | Admitting: Vascular Surgery

## 2020-04-06 DIAGNOSIS — K219 Gastro-esophageal reflux disease without esophagitis: Secondary | ICD-10-CM | POA: Diagnosis not present

## 2020-04-06 DIAGNOSIS — J449 Chronic obstructive pulmonary disease, unspecified: Secondary | ICD-10-CM | POA: Diagnosis not present

## 2020-04-06 DIAGNOSIS — S91001A Unspecified open wound, right ankle, initial encounter: Secondary | ICD-10-CM | POA: Diagnosis not present

## 2020-04-06 DIAGNOSIS — I739 Peripheral vascular disease, unspecified: Secondary | ICD-10-CM

## 2020-04-06 DIAGNOSIS — L97912 Non-pressure chronic ulcer of unspecified part of right lower leg with fat layer exposed: Secondary | ICD-10-CM | POA: Diagnosis not present

## 2020-04-11 ENCOUNTER — Encounter (INDEPENDENT_AMBULATORY_CARE_PROVIDER_SITE_OTHER): Payer: Self-pay | Admitting: Vascular Surgery

## 2020-04-15 ENCOUNTER — Other Ambulatory Visit
Admission: RE | Admit: 2020-04-15 | Discharge: 2020-04-15 | Disposition: A | Discharge status: Home / Self Care | Payer: Medicare Other | Source: Ambulatory Visit | Attending: Orthopedic Surgery | Admitting: Orthopedic Surgery

## 2020-04-15 ENCOUNTER — Other Ambulatory Visit: Payer: Self-pay

## 2020-04-15 HISTORY — DX: Anemia, unspecified: D64.9

## 2020-04-15 HISTORY — DX: Other specified postprocedural states: Z98.890

## 2020-04-15 HISTORY — DX: Nausea with vomiting, unspecified: R11.2

## 2020-04-15 NOTE — Patient Instructions (Signed)
Your procedure is scheduled on: Friday April 17, 2020. Report to Day Surgery inside [facility] 2nd floor. To find out your arrival time please call [phone] between 1PM - 3PM on Thursday April 16, 2020.  Remember: Instructions that are not followed completely may result in serious medical risk,  up to and including death, or upon the discretion of your surgeon and anesthesiologist your  surgery may need to be rescheduled.     _X__ 1. Do not eat food after midnight the night before your procedure.                 No chewing gum or hard candies. You may drink clear liquids up to 2 hours                 before you are scheduled to arrive for your surgery- DO not drink clear                 liquids within 2 hours of the start of your surgery.                 Clear Liquids include:  water, apple juice without pulp, clear Gatorade, G2 or                  Gatorade Zero (avoid Red/Purple/Blue), Black Coffee or Tea (Do not add                 anything to coffee or tea).  _____2.   Complete the "Ensure Clear Pre-surgery Clear Carbohydrate Drink" provided to you, 2 hours before arrival. **If you are diabetic you will be provided with an alternative drink, Gatorade Zero or G2.  __X__3.  On the morning of surgery brush your teeth with toothpaste and water, you                may rinse your mouth with mouthwash if you wish.  Do not swallow any toothpaste of mouthwash.     _X__ 4.  No Alcohol for 24 hours before or after surgery.   _X__ 5.  Do Not Smoke or use e-cigarettes For 24 Hours Prior to Your Surgery.                 Do not use any chewable tobacco products for at least 6 hours prior to                 Surgery.  _X__  6.  Do not use any recreational drugs (marijuana, cocaine, heroin, ecstasy, MDMA or other)                For at least one week prior to your surgery.  Combination of these drugs with anesthesia                May have life threatening  results.   __X_ 7.  Notify your doctor if there is any change in your medical condition      (cold, fever, infections).     Do not wear jewelry, make-up, hairpins, clips or nail polish. Do not wear lotions, powders, or perfumes. You may wear deodorant. Do not shave 48 hours prior to surgery. Men may shave face and neck. Do not bring valuables to the hospital.    [facility] is not responsible for any belongings or valuables.  Contacts, dentures or bridgework may not be worn into surgery. Leave your suitcase in the car. After surgery it may be brought to your room. For  patients admitted to the hospital, discharge time is determined by your treatment team.   Patients discharged the day of surgery will not be allowed to drive home.   Make arrangements for someone to be with you for the first 24 hours of your Same Day Discharge.    __X__ Take these medicines the morning of surgery with A SIP OF WATER:    1. cetirizine (ZYRTEC) 10 MG  2. carvedilol (COREG) 3.125 MG  3. ciprofloxacin (CIPRO) 500 MG   4. rifampin (RIFADIN) 300 MG   5. escitalopram (LEXAPRO) 10 MG  6. pantoprazole (PROTONIX) 40 MG  ____ Fleet Enema (as directed)   ____ Use CHG Soap (or wipes) as directed  ____ Use Benzoyl Peroxide Gel as instructed  ____ Use inhalers on the day of surgery  ____ Stop metformin 2 days prior to surgery    ____ Take 1/2 of usual insulin dose the night before surgery. No insulin the morning          of surgery.   __X__ Call your provider and ask when to stop Aspirin before your procedure.  __X__ Stop Anti-inflammatories such as Ibuprofen, Aleve, Advil, naproxen and or BC powders.    __X__ Stop supplements until after surgery.    ____ Bring C-Pap to the hospital.    If you have any questions regarding your pre-procedure instructions,  Please call Pre-admit Testing at [phone].

## 2020-04-16 ENCOUNTER — Inpatient Hospital Stay: Admission: RE | Admit: 2020-04-16 | Payer: Medicare Other | Source: Ambulatory Visit

## 2020-04-16 ENCOUNTER — Encounter: Payer: Self-pay | Admitting: Orthopedic Surgery

## 2020-04-16 NOTE — Discharge Instructions (Signed)
Instructions after Knee Surgery   Jasmine Conrad P. Holley Bouche., M.D.     Dept. of [facility]  [place], [place]  [address]  Phone: [phone]   Fax: [phone]    DIET: . Drink plenty of non-alcoholic fluids. . Resume your normal diet. Include foods high in fiber.  ACTIVITY:  . You may use crutches or a walker with touchdown weight-bearing, unless instructed otherwise. . Do NOT place pillows under the knee. Anything placed under the knee could limit your ability to straighten the knee.   . Continue doing gentle exercises. Exercising will reduce the pain and swelling, increase motion, and prevent muscle weakness.   . Please continue to use the TED compression stockings for 6 weeks. You may remove the stockings at night, but should reapply them in the morning. . Do not drive or operate any equipment until instructed.  WOUND CARE:  . Continue to use the PolarCare or ice packs periodically to reduce pain and swelling. . You may bathe or shower after the staples are removed at the first office visit following surgery.  MEDICATIONS: . Dennis Bast may resume your regular medications. . Please take the pain medication as prescribed on the medication. . Do not take pain medication on an empty stomach. . You have been given a prescription for a blood thinner (Lovenox or Coumadin). Please take the medication as instructed. (NOTE: After completing a 2 week course of Lovenox, take one Enteric-coated aspirin once a day. This along with elevation will help reduce the possibility of phlebitis in your operated leg.) . Do not drive or drink alcoholic beverages when taking pain medications.  CALL THE OFFICE FOR: . Temperature above 101 degrees . Excessive bleeding or drainage on the dressing. . Excessive swelling, coldness, or paleness of the toes. . Persistent nausea and vomiting.  FOLLOW-UP:  . You should have an appointment to return to the office  in 10-14 days after surgery. . Arrangements have been made for continuation of Physical Therapy (either home therapy or outpatient therapy).      [facility] Department Directory         [email or web address]       [email or web address]          Cardiology  Appointments: [place] [phone]  Endocrinology  Appointments: [place] [phone] [place] [phone]  Gastroenterology  Appointments: [place] [phone] [place] [phone]        General Surgery   Appointments: [facility]  Internal Medicine/Family Medicine  Appointments: [facility] [place] - [phone] [place] [phone]  Metabolic and [place] Loss Surgery  Appointments: [facility]        Neurology  Appointments: [place] [phone] [place] - [phone]  Neurosurgery  Appointments: [place]  Obstetrics & Gynecology  Appointments: [place] [phone] [place] - [phone]        Pediatrics  Appointments: [place] [phone] [place] - [phone]  Physiatry  Appointments: [place] [phone]  Physical Therapy  Appointments: [place] [phone]        Podiatry  Appointments: [place] [phone] [place] - [phone]  Pulmonology  Appointments: [place]  Rheumatology  Appointments: [place] [phone]        [place] Location: [facility]  [address], [place]  [address]  [place] Location: [facility] [address], [place]  [address]  [place] Location: [facility] [place],  [place]  [address]

## 2020-04-16 NOTE — H&P (Signed)
ORTHOPAEDIC HISTORY & PHYSICAL Jasmine Morrow, Jasmine Marker., MD - 04/10/2020 3:00 PM EST Formatting of this note is different from the original. Images from the original note were not included. Chief Complaint: Chief Complaint  Patient presents with  . Follow-up  Right knee and right ankle wound checks   Reason for Visit: The patient is a 75 y.o. female who presents today with her husband for reevaluation of her right knee and right ankle. She presents today via EMS. She is 2-1/2 months status post right knee arthrotomy, irrigation and debridement, polyethylene exchange, and placement of antibiotic eluding beads. Intraoperative cultures were consistent with methicillin sensitive staph aureus. She has completed the course of Ancef and has continued with rifampin and Cipro as per Infectious Disease (Dr. Delaine Lame). She has had persistent drainage from the inferior aspect of the knee incision.  The patient is also over 1 year status post ORIF of a right ankle fracture performed by Dr. Rudene Christians. She had been admitted with sepsis postoperatively. She continues to have moderate serous drainage from the medial lesion of the right ankle. Wound cultures from the last visit grew Pseudomonas putida (sensitive to Cipro). The patient denies any fevers or chills.   She was evaluated by Dr. Delana Meyer (Vascular surgery) last week. Resting right ankle-brachial index was within normal range. There was no evidence of significant right lower extremity arterial disease. The right toe-brachial index was normal.  Medications: Current Outpatient Medications  Medication Sig Dispense Refill  . acetaminophen (TYLENOL) 500 MG tablet Take 500 mg by mouth every 6 (six) hours as needed for Pain.  Marland Kitchen artificial tears (GONIOSCOP-HPM) 2.5 % ophthalmic solution Place 1 drop into both eyes as needed  . aspirin 81 MG EC tablet Take 81 mg by mouth once daily.  . carvediloL (COREG) 3.125 MG tablet TAKE ONE TABLET TWICE DAILY WITH MEALS  180 tablet 0  . cetirizine (ZYRTEC) 10 mg capsule Take 10 mg by mouth once daily.  . ciprofloxacin HCl (CIPRO) 500 MG tablet Take 500 mg by mouth 2 (two) times daily  . escitalopram oxalate (LEXAPRO) 10 MG tablet TAKE 1 TABLET BY MOUTH DAILY 30 tablet 2  . nystatin (MYCOSTATIN) 100,000 unit/gram ointment Apply 1 Application topically as needed  . ondansetron (ZOFRAN) 4 MG tablet Take 4 mg by mouth every 8 (eight) hours as needed  . polyethylene glycol (MIRALAX) powder Take 8.5 g by mouth once daily  . potassium chloride (KLOR-CON) 10 mEq ER tablet Take 1 tablet (10 mEq total) by mouth once daily 30 tablet 11  . skin protectants, misc. PadM Apply topically once daily as needed  . traMADoL (ULTRAM) 50 mg tablet Take 50 mg by mouth every 6 (six) hours as needed  . vit C,E-Zn-coppr-lutein-zeaxan (PRESERVISION AREDS 2) 250-200-40-1 mg-unit-mg-mg Cap Take by mouth 2 (two) times daily.   No current facility-administered medications for this visit.   Allergies: Allergies  Allergen Reactions  . Singulair [Montelukast] Other (See Comments)  Gland swelling  . Sulfa (Sulfonamide Antibiotics) Swelling  . Avinza [Morphine] Itching  . Cefadroxil Abdominal Pain  . Clindamycin Headache  . Darvocet A500 [Propoxyphene N-Acetaminophen] Nausea  . Doxycycline Abdominal Pain  depression  . Nickel Rash  . Ultracet [Tramadol-Acetaminophen] Rash  . Vicodin [Hydrocodone-Acetaminophen] Vomiting  . Vioxx [Rofecoxib] Other (See Comments)  GI   Past Medical History: Past Medical History:  Diagnosis Date  . Anemia  . Arthritis  . Asthma without status asthmaticus, unspecified  . History of chest pain  Chest pain with abnormal  Myoview treadmill stress test 09/2011. Cardiac cath 10/2011 showed completely normal coronary arteries, however she did have mild left ventricular dysfunction with anterior wall motion abnormality. Left ventricular EF was 51%.  . History of chickenpox  . Hyperlipidemia  . Hypertension   . 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 Macular degeneration  . Multiple thyroid nodules  . Osteoarthritis  . Ovarian cyst  . Seasonal allergies  . Sinusitis, unspecified  . Sleep apnea  On CPAP.  Marland Kitchen Ulcer   Past Surgical History: Past Surgical History:  Procedure Laterality Date  . Right knee arthrotomy, irrigation and debridement of the right knee, polyethylene exchange 01/24/2020  Dr Marry Guan  . ANKLE ARTHODESIS W/ ARTHROSCOPY Right 02/08/2019  03/14/2019 second surgery  . CESAREAN SECTION  . CHOLECYSTECTOMY  . COLONOSCOPY 07/30/2012  internal hemorrhoids, diverticulosis  . COLONOSCOPY 02/22/2019  Blood in entire colon/Diverticulosis - Presumed diverticular bleed. No repeat recommended per TKT.  . EGD 02/22/2019  Gastritis/gastric ulcers/Hiatal hernia/Otherwise normal - no repeat recommended per TKT.  Marland Kitchen Left total knee arthroplasty 10/12/2007  Dr Marry Guan  . ORIF ANKLE FRACTURE Right 03/14/2019  Dr. Rudene Christians  . REMOVAL HARDWARE ANKLE FOOT/TOES Right 11/12/2019  Dr. Rudene Christians  . Right total knee arthroplasty 10/11/2006  Dr Marry Guan  . SALPINGO OOPHORECTOMY Bilateral 1993   Social History: Social History   Socioeconomic History  . Marital status: Married  Spouse name: Yvone Neu  . Number of children: 1  . Years of education: 76  . Highest education level: High school graduate  Occupational History  . Occupation: Retired  Tobacco Use  . Smoking status: Former Smoker  Packs/day: 2.00  Years: 25.00  Pack years: 50.00  Types: Cigarettes  Quit date: 01/31/1990  Years since quitting: 30.2  . Smokeless tobacco: Never Used  Vaping Use  . Vaping Use: Never used  Substance and Sexual Activity  . Alcohol use: No  Alcohol/week: 0.0 standard drinks  . Drug use: No  . Sexual activity: Yes  Partners: Male  Birth control/protection: None  Other Topics Concern  . Not on file  Social History Narrative  . Not on file   Social Determinants of Health   Financial Resource Strain: Not  on file  Food Insecurity: Not on file  Transportation Needs: Not on file  Physical Activity: Not on file  Stress: Not on file  Social Connections: Not on file  Housing Stability: Not on file   Family History: Family History  Problem Relation Age of Onset  . High blood pressure (Hypertension) Mother  . Bone cancer Mother  . Osteoporosis (Thinning of bones) Mother  . COPD Mother  . No Known Problems Father   Review of Systems: A comprehensive 14 point ROS was performed, reviewed, and the pertinent orthopaedic findings are documented in the HPI.  Exam BP 118/72  Temp 36.3 C (97.3 F)  Ht 172.7 cm (5\' 8" )  LMP (LMP Unknown)  BMI 37.25 kg/m   General:  Well-developed, well-nourished female seen in no acute distress.  Gait was not assessed since the patient presented on a stretcher.   HEENT:  Atraumatic, normocephalic. Pupils are equal and reactive to light. Extraocular motion is intact. Sclera are clear. Oropharynx is clear with moist mucosa.  Neck:  Supple, nontender, and with good ROM.   Lungs:  Clear to auscultation bilaterally.  Cardiovascular:  Regular rate and rhythm. Normal S1, S2. No murmur . No appreciable gallops or rubs. Peripheral pulses are palpable. No lower extremity edema. Homan`s test is negative.  Abdomen:  Soft, nontender, nondistended. Bowel sounds are present.  Right Knee: Soft tissue swelling: minimal Effusion: none Erythema: none Crepitance: none Tenderness: No focal tenderness Alignment: normal Mediolateral laxity: stable Atrophy: Generalized quadriceps atrophy. Quadriceps tone was poor to fair. Range of motion: Not assessed Skin margins were well approximated with the exception of a 2 mm area at the inferior aspect of the incision. Serosanguinous drainage was noted.  Right foot and ankle: No significant swelling or erythema. A lesion was again noted to the medial aspect of the ankle measuring approximately 1 cm in diameter. Moderate  clear serous drainage is noted. Delayed capillary refill to the toes. Pedal pulses were difficult to palpate.  Neurologic:  Awake, alert, and oriented.  Sensory function is intact to pinprick and light touch.  Motor strength is judged to be 5/5 except as noted above.  Motor coordination is within normal limits.  No apparent clonus. No tremor.   Impression: Right knee periprosthetic infection (MSSA) Persistent wound to the medial right ankle  Plan:  The findings were discussed in detail with the patient and her husband. New dressings were applied to both the knee incision and to the ankle wound. The patient has had persistent drainage despite to both sites despite appropriate antibiotic coverage. Her nutritional status has been compromised with her chronic nausea and poor intake. There does not appear to be any arterial disease contributing to the delayed healing. We had a lengthy discussion about the possibility of a chronic deep infection that may be complicated by the retained hardware in the ankle and knee. We discussed the risks and benefits of surgical intervention, namely the removal of the ankle hardware and debridement of the right knee. The possible knee for removal of the knee implants and placement of an antibiotic impregnated spacer was explained. The rational behind a 2 stage approach to the knee was explained. I would anticipate 6 weeks of postoperative antibiotics and return to the OR 2 weeks later for removal of the spacer and reimplantation of total knee implants if the infection has been eradicated The usual perioperative course was also discussed in detail. The patient expressed understanding of the risks and benefits of surgical intervention and would like to proceed with plans as outlined.Marland Kitchen  MEDICAL CLEARANCE: Per anesthesiology. ACTIVITY: As tolerated. WORK STATUS: Not applicable. THERAPY: None at this time. MEDICATIONS: Requested Prescriptions   No prescriptions  requested or ordered in this encounter   FOLLOW-UP: Return for postoperative follow-up.  Kenni Newton P. Holley Bouche., M.D.  This note was generated in part with voice recognition software and I apologize for any typographical errors that were not detected and corrected.   Electronically signed by Lamar Benes., MD at 04/12/2020 11:45 AM EDT

## 2020-04-17 ENCOUNTER — Encounter: Payer: Self-pay | Admitting: Orthopedic Surgery

## 2020-04-17 ENCOUNTER — Inpatient Hospital Stay
Admission: RE | Admit: 2020-04-17 | Discharge: 2020-04-23 | DRG: 467 | Disposition: A | Discharge status: Home Health | Payer: Medicare Other | Attending: Internal Medicine | Admitting: Internal Medicine

## 2020-04-17 ENCOUNTER — Other Ambulatory Visit: Payer: Self-pay

## 2020-04-17 ENCOUNTER — Ambulatory Visit: Payer: Medicare Other

## 2020-04-17 ENCOUNTER — Ambulatory Visit: Payer: Medicare Other | Admitting: Urgent Care

## 2020-04-17 ENCOUNTER — Inpatient Hospital Stay: Payer: Medicare Other

## 2020-04-17 ENCOUNTER — Encounter
Admission: RE | Disposition: A | Discharge status: Home Health | Payer: Self-pay | Source: Home / Self Care | Attending: Orthopedic Surgery

## 2020-04-17 DIAGNOSIS — B373 Candidiasis of vulva and vagina: Secondary | ICD-10-CM | POA: Diagnosis not present

## 2020-04-17 DIAGNOSIS — D509 Iron deficiency anemia, unspecified: Secondary | ICD-10-CM | POA: Diagnosis present

## 2020-04-17 DIAGNOSIS — G4730 Sleep apnea, unspecified: Secondary | ICD-10-CM | POA: Diagnosis present

## 2020-04-17 DIAGNOSIS — Z8262 Family history of osteoporosis: Secondary | ICD-10-CM

## 2020-04-17 DIAGNOSIS — Z888 Allergy status to other drugs, medicaments and biological substances status: Secondary | ICD-10-CM

## 2020-04-17 DIAGNOSIS — Z9989 Dependence on other enabling machines and devices: Secondary | ICD-10-CM

## 2020-04-17 DIAGNOSIS — E785 Hyperlipidemia, unspecified: Secondary | ICD-10-CM | POA: Diagnosis present

## 2020-04-17 DIAGNOSIS — T8453XD Infection and inflammatory reaction due to internal right knee prosthesis, subsequent encounter: Secondary | ICD-10-CM | POA: Diagnosis not present

## 2020-04-17 DIAGNOSIS — Z79899 Other long term (current) drug therapy: Secondary | ICD-10-CM

## 2020-04-17 DIAGNOSIS — L8990 Pressure ulcer of unspecified site, unspecified stage: Secondary | ICD-10-CM | POA: Diagnosis present

## 2020-04-17 DIAGNOSIS — Z825 Family history of asthma and other chronic lower respiratory diseases: Secondary | ICD-10-CM | POA: Diagnosis not present

## 2020-04-17 DIAGNOSIS — Z20822 Contact with and (suspected) exposure to covid-19: Secondary | ICD-10-CM | POA: Diagnosis present

## 2020-04-17 DIAGNOSIS — Z8249 Family history of ischemic heart disease and other diseases of the circulatory system: Secondary | ICD-10-CM

## 2020-04-17 DIAGNOSIS — D500 Iron deficiency anemia secondary to blood loss (chronic): Secondary | ICD-10-CM | POA: Diagnosis not present

## 2020-04-17 DIAGNOSIS — I959 Hypotension, unspecified: Secondary | ICD-10-CM | POA: Diagnosis not present

## 2020-04-17 DIAGNOSIS — R197 Diarrhea, unspecified: Secondary | ICD-10-CM | POA: Diagnosis not present

## 2020-04-17 DIAGNOSIS — R918 Other nonspecific abnormal finding of lung field: Secondary | ICD-10-CM | POA: Diagnosis present

## 2020-04-17 DIAGNOSIS — K219 Gastro-esophageal reflux disease without esophagitis: Secondary | ICD-10-CM | POA: Diagnosis present

## 2020-04-17 DIAGNOSIS — Z96659 Presence of unspecified artificial knee joint: Secondary | ICD-10-CM

## 2020-04-17 DIAGNOSIS — R110 Nausea: Secondary | ICD-10-CM | POA: Diagnosis not present

## 2020-04-17 DIAGNOSIS — Z66 Do not resuscitate: Secondary | ICD-10-CM | POA: Diagnosis present

## 2020-04-17 DIAGNOSIS — R079 Chest pain, unspecified: Secondary | ICD-10-CM | POA: Diagnosis not present

## 2020-04-17 DIAGNOSIS — F419 Anxiety disorder, unspecified: Secondary | ICD-10-CM | POA: Diagnosis present

## 2020-04-17 DIAGNOSIS — Z7982 Long term (current) use of aspirin: Secondary | ICD-10-CM

## 2020-04-17 DIAGNOSIS — G4733 Obstructive sleep apnea (adult) (pediatric): Secondary | ICD-10-CM

## 2020-04-17 DIAGNOSIS — L0889 Other specified local infections of the skin and subcutaneous tissue: Secondary | ICD-10-CM | POA: Diagnosis not present

## 2020-04-17 DIAGNOSIS — T8459XA Infection and inflammatory reaction due to other internal joint prosthesis, initial encounter: Secondary | ICD-10-CM

## 2020-04-17 DIAGNOSIS — Z881 Allergy status to other antibiotic agents status: Secondary | ICD-10-CM

## 2020-04-17 DIAGNOSIS — Z882 Allergy status to sulfonamides status: Secondary | ICD-10-CM | POA: Diagnosis not present

## 2020-04-17 DIAGNOSIS — Z9049 Acquired absence of other specified parts of digestive tract: Secondary | ICD-10-CM | POA: Diagnosis not present

## 2020-04-17 DIAGNOSIS — J45909 Unspecified asthma, uncomplicated: Secondary | ICD-10-CM | POA: Diagnosis present

## 2020-04-17 DIAGNOSIS — K30 Functional dyspepsia: Secondary | ICD-10-CM | POA: Diagnosis not present

## 2020-04-17 DIAGNOSIS — Z87891 Personal history of nicotine dependence: Secondary | ICD-10-CM | POA: Diagnosis not present

## 2020-04-17 DIAGNOSIS — I10 Essential (primary) hypertension: Secondary | ICD-10-CM | POA: Diagnosis present

## 2020-04-17 DIAGNOSIS — D638 Anemia in other chronic diseases classified elsewhere: Secondary | ICD-10-CM | POA: Diagnosis not present

## 2020-04-17 DIAGNOSIS — D519 Vitamin B12 deficiency anemia, unspecified: Secondary | ICD-10-CM | POA: Diagnosis not present

## 2020-04-17 DIAGNOSIS — E538 Deficiency of other specified B group vitamins: Secondary | ICD-10-CM | POA: Diagnosis present

## 2020-04-17 DIAGNOSIS — N179 Acute kidney failure, unspecified: Secondary | ICD-10-CM | POA: Diagnosis present

## 2020-04-17 DIAGNOSIS — Z96652 Presence of left artificial knee joint: Secondary | ICD-10-CM | POA: Diagnosis present

## 2020-04-17 DIAGNOSIS — Z8719 Personal history of other diseases of the digestive system: Secondary | ICD-10-CM

## 2020-04-17 DIAGNOSIS — J449 Chronic obstructive pulmonary disease, unspecified: Secondary | ICD-10-CM | POA: Diagnosis present

## 2020-04-17 DIAGNOSIS — I739 Peripheral vascular disease, unspecified: Secondary | ICD-10-CM | POA: Diagnosis present

## 2020-04-17 DIAGNOSIS — Y92239 Unspecified place in hospital as the place of occurrence of the external cause: Secondary | ICD-10-CM | POA: Diagnosis not present

## 2020-04-17 DIAGNOSIS — M1990 Unspecified osteoarthritis, unspecified site: Secondary | ICD-10-CM | POA: Diagnosis present

## 2020-04-17 DIAGNOSIS — T368X5A Adverse effect of other systemic antibiotics, initial encounter: Secondary | ICD-10-CM | POA: Diagnosis not present

## 2020-04-17 DIAGNOSIS — L89311 Pressure ulcer of right buttock, stage 1: Secondary | ICD-10-CM | POA: Diagnosis present

## 2020-04-17 DIAGNOSIS — M009 Pyogenic arthritis, unspecified: Secondary | ICD-10-CM | POA: Diagnosis present

## 2020-04-17 DIAGNOSIS — T8450XA Infection and inflammatory reaction due to unspecified internal joint prosthesis, initial encounter: Secondary | ICD-10-CM | POA: Diagnosis not present

## 2020-04-17 DIAGNOSIS — Y831 Surgical operation with implant of artificial internal device as the cause of abnormal reaction of the patient, or of later complication, without mention of misadventure at the time of the procedure: Secondary | ICD-10-CM | POA: Diagnosis present

## 2020-04-17 DIAGNOSIS — T8459XD Infection and inflammatory reaction due to other internal joint prosthesis, subsequent encounter: Secondary | ICD-10-CM | POA: Diagnosis not present

## 2020-04-17 DIAGNOSIS — T8453XA Infection and inflammatory reaction due to internal right knee prosthesis, initial encounter: Secondary | ICD-10-CM | POA: Diagnosis present

## 2020-04-17 DIAGNOSIS — B3749 Other urogenital candidiasis: Secondary | ICD-10-CM | POA: Diagnosis not present

## 2020-04-17 DIAGNOSIS — R112 Nausea with vomiting, unspecified: Secondary | ICD-10-CM | POA: Diagnosis not present

## 2020-04-17 DIAGNOSIS — L89321 Pressure ulcer of left buttock, stage 1: Secondary | ICD-10-CM | POA: Diagnosis present

## 2020-04-17 HISTORY — PX: HARDWARE REMOVAL: SHX979

## 2020-04-17 LAB — GRAM STAIN
Gram Stain: NONE SEEN
Gram Stain: NONE SEEN
Gram Stain: NONE SEEN
Gram Stain: NONE SEEN
Gram Stain: NONE SEEN
Gram Stain: NONE SEEN
Gram Stain: NONE SEEN
Gram Stain: NONE SEEN
Gram Stain: NONE SEEN
Gram Stain: NONE SEEN

## 2020-04-17 LAB — COMPREHENSIVE METABOLIC PANEL
ALT: 26 U/L (ref 0–44)
AST: 37 U/L (ref 15–41)
Albumin: 2.9 g/dL — ABNORMAL LOW (ref 3.5–5.0)
Alkaline Phosphatase: 91 U/L (ref 38–126)
Anion gap: 9 (ref 5–15)
BUN: 16 mg/dL (ref 8–23)
CO2: 24 mmol/L (ref 22–32)
Calcium: 9.7 mg/dL (ref 8.9–10.3)
Chloride: 105 mmol/L (ref 98–111)
Creatinine, Ser: 1.06 mg/dL — ABNORMAL HIGH (ref 0.44–1.00)
GFR, Estimated: 55 mL/min — ABNORMAL LOW (ref >60)
Glucose, Bld: 142 mg/dL — ABNORMAL HIGH (ref 70–99)
Potassium: 4.9 mmol/L (ref 3.5–5.1)
Sodium: 138 mmol/L (ref 135–145)
Total Bilirubin: 0.6 mg/dL (ref 0.3–1.2)
Total Protein: 6.2 g/dL — ABNORMAL LOW (ref 6.5–8.1)

## 2020-04-17 LAB — CBC WITH DIFFERENTIAL/PLATELET
Abs Immature Granulocytes: 0.05 10*3/uL (ref 0.00–0.07)
Basophils Absolute: 0 10*3/uL (ref 0.0–0.1)
Basophils Relative: 0 %
Eosinophils Absolute: 0 10*3/uL (ref 0.0–0.5)
Eosinophils Relative: 0 %
HCT: 31.3 % — ABNORMAL LOW (ref 36.0–46.0)
Hemoglobin: 9.5 g/dL — ABNORMAL LOW (ref 12.0–15.0)
Immature Granulocytes: 0 %
Lymphocytes Relative: 7 %
Lymphs Abs: 1 10*3/uL (ref 0.7–4.0)
MCH: 23.8 pg — ABNORMAL LOW (ref 26.0–34.0)
MCHC: 30.4 g/dL (ref 30.0–36.0)
MCV: 78.3 fL — ABNORMAL LOW (ref 80.0–100.0)
Monocytes Absolute: 0.9 10*3/uL (ref 0.1–1.0)
Monocytes Relative: 7 %
Neutro Abs: 11.8 10*3/uL — ABNORMAL HIGH (ref 1.7–7.7)
Neutrophils Relative %: 86 %
Platelets: 370 10*3/uL (ref 150–400)
RBC: 4 MIL/uL (ref 3.87–5.11)
RDW: 15.5 % (ref 11.5–15.5)
WBC: 13.8 10*3/uL — ABNORMAL HIGH (ref 4.0–10.5)
nRBC: 0 % (ref 0.0–0.2)

## 2020-04-17 LAB — TSH: TSH: 3.117 u[IU]/mL (ref 0.350–4.500)

## 2020-04-17 LAB — LIPID PANEL
Cholesterol: 175 mg/dL (ref 0–200)
HDL: 41 mg/dL (ref >40)
LDL Cholesterol: 107 mg/dL — ABNORMAL HIGH (ref 0–99)
Total CHOL/HDL Ratio: 4.3 RATIO
Triglycerides: 137 mg/dL (ref <150)
VLDL: 27 mg/dL (ref 0–40)

## 2020-04-17 LAB — SEDIMENTATION RATE: Sed Rate: 83 mm/hr — ABNORMAL HIGH (ref 0–30)

## 2020-04-17 LAB — SYNOVIAL CELL COUNT + DIFF, W/ CRYSTALS
Eosinophils-Synovial: 0 %
Lymphocytes-Synovial Fld: 3 %
Monocyte-Macrophage-Synovial Fluid: 3 %
Neutrophil, Synovial: 94 %
WBC, Synovial: 28743 /mm3 — ABNORMAL HIGH (ref 0–200)

## 2020-04-17 LAB — SURGICAL PCR SCREEN
MRSA, PCR: NEGATIVE
Staphylococcus aureus: NEGATIVE

## 2020-04-17 LAB — C-REACTIVE PROTEIN: CRP: 5.9 mg/dL — ABNORMAL HIGH (ref <1.0)

## 2020-04-17 LAB — BRAIN NATRIURETIC PEPTIDE: B Natriuretic Peptide: 38.4 pg/mL (ref 0.0–100.0)

## 2020-04-17 LAB — SARS CORONAVIRUS 2 BY RT PCR (HOSPITAL ORDER, PERFORMED IN ~~LOC~~ HOSPITAL LAB): SARS Coronavirus 2: NEGATIVE

## 2020-04-17 LAB — TROPONIN I (HIGH SENSITIVITY): Troponin I (High Sensitivity): 5 ng/L (ref <18)

## 2020-04-17 SURGERY — REMOVAL, HARDWARE
Anesthesia: General | Laterality: Right

## 2020-04-17 MED ORDER — PROPOFOL 10 MG/ML IV BOLUS
INTRAVENOUS | Status: DC | PRN
  Administered 2020-04-17: 150 mg via INTRAVENOUS

## 2020-04-17 MED ORDER — APREPITANT 40 MG PO CAPS
ORAL_CAPSULE | ORAL | Status: AC
  Administered 2020-04-17: 40 mg via ORAL
  Filled 2020-04-17: qty 1

## 2020-04-17 MED ORDER — CEFAZOLIN SODIUM-DEXTROSE 2-4 GM/100ML-% IV SOLN
2.0000 g | Freq: Four times a day (QID) | INTRAVENOUS | Status: AC
Start: 2020-04-17 — End: 2020-04-18
  Administered 2020-04-18 (×2): 2 g via INTRAVENOUS
  Filled 2020-04-17 (×2): qty 100

## 2020-04-17 MED ORDER — EPHEDRINE SULFATE 50 MG/ML IJ SOLN
INTRAMUSCULAR | Status: DC | PRN
  Administered 2020-04-17: 10 mg via INTRAVENOUS
  Administered 2020-04-17: 15 mg via INTRAVENOUS

## 2020-04-17 MED ORDER — PHENOL 1.4 % MT LIQD
1.0000 | OROMUCOSAL | Status: DC | PRN
  Filled 2020-04-17: qty 177

## 2020-04-17 MED ORDER — SCOPOLAMINE 1 MG/3DAYS TD PT72: 1.0000 | MEDICATED_PATCH | TRANSDERMAL | Status: DC

## 2020-04-17 MED ORDER — ACETAMINOPHEN 325 MG PO TABS: 650.0000 mg | ORAL_TABLET | ORAL | Status: DC | PRN

## 2020-04-17 MED ORDER — LIDOCAINE HCL (CARDIAC) PF 100 MG/5ML IV SOSY
PREFILLED_SYRINGE | INTRAVENOUS | Status: DC | PRN
  Administered 2020-04-17 (×2): 20 mg via INTRAVENOUS
  Administered 2020-04-17: 100 mg via INTRAVENOUS
  Administered 2020-04-17: 40 mg via INTRAVENOUS
  Administered 2020-04-17: 20 mg via INTRAVENOUS

## 2020-04-17 MED ORDER — ROCURONIUM BROMIDE 100 MG/10ML IV SOLN
INTRAVENOUS | Status: DC | PRN
  Administered 2020-04-17: 20 mg via INTRAVENOUS
  Administered 2020-04-17: 50 mg via INTRAVENOUS
  Administered 2020-04-17: 20 mg via INTRAVENOUS
  Administered 2020-04-17: 10 mg via INTRAVENOUS
  Administered 2020-04-17: 30 mg via INTRAVENOUS
  Administered 2020-04-17 (×2): 20 mg via INTRAVENOUS

## 2020-04-17 MED ORDER — DIPHENHYDRAMINE HCL 12.5 MG/5ML PO ELIX
12.5000 mg | ORAL_SOLUTION | ORAL | Status: DC | PRN
Start: 2020-04-17 — End: 2020-04-23
  Filled 2020-04-17: qty 10

## 2020-04-17 MED ORDER — IPRATROPIUM-ALBUTEROL 0.5-2.5 (3) MG/3ML IN SOLN: 3.0000 mL | Freq: Once | RESPIRATORY_TRACT | Status: AC

## 2020-04-17 MED ORDER — ONDANSETRON HCL 4 MG/2ML IJ SOLN
INTRAMUSCULAR | Status: DC | PRN
  Administered 2020-04-17 (×2): 4 mg via INTRAVENOUS

## 2020-04-17 MED ORDER — FERROUS SULFATE 325 (65 FE) MG PO TABS
325.0000 mg | ORAL_TABLET | Freq: Two times a day (BID) | ORAL | Status: DC
  Administered 2020-04-18 – 2020-04-20 (×6): 325 mg via ORAL
  Filled 2020-04-17 (×9): qty 1

## 2020-04-17 MED ORDER — EPHEDRINE 5 MG/ML INJ
INTRAVENOUS | Status: AC
  Filled 2020-04-17: qty 10

## 2020-04-17 MED ORDER — ORAL CARE MOUTH RINSE: 15.0000 mL | Freq: Once | OROMUCOSAL | Status: AC

## 2020-04-17 MED ORDER — ALPRAZOLAM 0.25 MG PO TABS: 0.2500 mg | ORAL_TABLET | Freq: Two times a day (BID) | ORAL | Status: DC | PRN

## 2020-04-17 MED ORDER — ROCURONIUM BROMIDE 10 MG/ML (PF) SYRINGE
PREFILLED_SYRINGE | INTRAVENOUS | Status: AC
  Filled 2020-04-17: qty 10

## 2020-04-17 MED ORDER — FENTANYL CITRATE (PF) 100 MCG/2ML IJ SOLN
25.0000 ug | INTRAMUSCULAR | Status: DC | PRN
  Administered 2020-04-17: 25 ug via INTRAVENOUS

## 2020-04-17 MED ORDER — MENTHOL 3 MG MT LOZG
1.0000 | LOZENGE | OROMUCOSAL | Status: DC | PRN
  Filled 2020-04-17: qty 9

## 2020-04-17 MED ORDER — NYSTATIN 100000 UNIT/GM EX OINT
1.0000 "application " | TOPICAL_OINTMENT | Freq: Every day | CUTANEOUS | Status: DC | PRN
  Filled 2020-04-17: qty 15

## 2020-04-17 MED ORDER — SCOPOLAMINE 1 MG/3DAYS TD PT72
MEDICATED_PATCH | TRANSDERMAL | Status: AC
  Filled 2020-04-17: qty 1

## 2020-04-17 MED ORDER — SODIUM CHLORIDE (PF) 0.9 % IJ SOLN
INTRAMUSCULAR | Status: AC
  Filled 2020-04-17: qty 20

## 2020-04-17 MED ORDER — MIDAZOLAM HCL 2 MG/2ML IJ SOLN
INTRAMUSCULAR | Status: DC | PRN
  Administered 2020-04-17 (×2): 1 mg via INTRAVENOUS

## 2020-04-17 MED ORDER — ACETAMINOPHEN 10 MG/ML IV SOLN
INTRAVENOUS | Status: DC | PRN
  Administered 2020-04-17: 1000 mg via INTRAVENOUS

## 2020-04-17 MED ORDER — IPRATROPIUM-ALBUTEROL 0.5-2.5 (3) MG/3ML IN SOLN
RESPIRATORY_TRACT | Status: AC
  Administered 2020-04-17: 3 mL via RESPIRATORY_TRACT
  Filled 2020-04-17: qty 3

## 2020-04-17 MED ORDER — FENTANYL CITRATE (PF) 100 MCG/2ML IJ SOLN
INTRAMUSCULAR | Status: AC
  Administered 2020-04-17: 25 ug via INTRAVENOUS
  Filled 2020-04-17: qty 2

## 2020-04-17 MED ORDER — ONDANSETRON HCL 4 MG/2ML IJ SOLN
INTRAMUSCULAR | Status: AC
  Filled 2020-04-17: qty 2

## 2020-04-17 MED ORDER — MIDAZOLAM HCL 2 MG/2ML IJ SOLN: 1.0000 mg | Freq: Once | INTRAMUSCULAR | Status: DC

## 2020-04-17 MED ORDER — KETAMINE HCL 10 MG/ML IJ SOLN
INTRAMUSCULAR | Status: DC | PRN
  Administered 2020-04-17: 10 mg via INTRAVENOUS
  Administered 2020-04-17 (×2): 20 mg via INTRAVENOUS

## 2020-04-17 MED ORDER — METOCLOPRAMIDE HCL 10 MG PO TABS
10.0000 mg | ORAL_TABLET | Freq: Three times a day (TID) | ORAL | Status: DC
  Administered 2020-04-18 (×2): 10 mg via ORAL
  Filled 2020-04-17 (×5): qty 1

## 2020-04-17 MED ORDER — CEFAZOLIN SODIUM 1 G IJ SOLR
INTRAMUSCULAR | Status: AC
  Filled 2020-04-17: qty 20

## 2020-04-17 MED ORDER — VANCOMYCIN HCL 1000 MG IV SOLR
INTRAVENOUS | Status: AC
  Filled 2020-04-17: qty 1000

## 2020-04-17 MED ORDER — FENTANYL CITRATE (PF) 100 MCG/2ML IJ SOLN
INTRAMUSCULAR | Status: AC
  Filled 2020-04-17: qty 2

## 2020-04-17 MED ORDER — ACETAMINOPHEN 10 MG/ML IV SOLN
INTRAVENOUS | Status: AC
  Filled 2020-04-17: qty 100

## 2020-04-17 MED ORDER — ESCITALOPRAM OXALATE 10 MG PO TABS
10.0000 mg | ORAL_TABLET | Freq: Every day | ORAL | Status: DC
  Administered 2020-04-18 – 2020-04-23 (×6): 10 mg via ORAL
  Filled 2020-04-17 (×7): qty 1

## 2020-04-17 MED ORDER — LIDOCAINE HCL (PF) 2 % IJ SOLN
INTRAMUSCULAR | Status: AC
  Filled 2020-04-17: qty 5

## 2020-04-17 MED ORDER — GENTAMICIN SULFATE 40 MG/ML IJ SOLN
INTRAMUSCULAR | Status: DC | PRN
  Administered 2020-04-17: 80 mg

## 2020-04-17 MED ORDER — NEOMYCIN-POLYMYXIN B GU 40-200000 IR SOLN
Status: AC
  Filled 2020-04-17: qty 20

## 2020-04-17 MED ORDER — ONDANSETRON HCL 4 MG PO TABS: 4.0000 mg | ORAL_TABLET | Freq: Four times a day (QID) | ORAL | Status: DC | PRN

## 2020-04-17 MED ORDER — ONDANSETRON HCL 4 MG/2ML IJ SOLN
4.0000 mg | Freq: Four times a day (QID) | INTRAMUSCULAR | Status: DC | PRN
  Administered 2020-04-17 – 2020-04-19 (×4): 4 mg via INTRAVENOUS
  Filled 2020-04-17 (×2): qty 2

## 2020-04-17 MED ORDER — KETAMINE HCL 50 MG/5ML IJ SOSY
PREFILLED_SYRINGE | INTRAMUSCULAR | Status: AC
  Filled 2020-04-17: qty 5

## 2020-04-17 MED ORDER — POTASSIUM CHLORIDE CRYS ER 10 MEQ PO TBCR
10.0000 meq | EXTENDED_RELEASE_TABLET | Freq: Every day | ORAL | Status: DC
  Administered 2020-04-18: 10 meq via ORAL
  Filled 2020-04-17: qty 1

## 2020-04-17 MED ORDER — MAGNESIUM HYDROXIDE 400 MG/5ML PO SUSP
30.0000 mL | Freq: Every day | ORAL | Status: DC
  Administered 2020-04-18: 30 mL via ORAL
  Filled 2020-04-17 (×2): qty 30

## 2020-04-17 MED ORDER — SODIUM CHLORIDE 0.9 % IV SOLN: INTRAVENOUS | Status: DC

## 2020-04-17 MED ORDER — ONDANSETRON HCL 4 MG/2ML IJ SOLN
4.0000 mg | Freq: Four times a day (QID) | INTRAMUSCULAR | Status: DC | PRN
  Filled 2020-04-17 (×2): qty 2

## 2020-04-17 MED ORDER — TOBRAMYCIN SULFATE POWD
Status: DC | PRN
  Administered 2020-04-17: 3600 mg

## 2020-04-17 MED ORDER — MIDAZOLAM HCL 2 MG/2ML IJ SOLN
INTRAMUSCULAR | Status: AC
  Filled 2020-04-17: qty 2

## 2020-04-17 MED ORDER — SENNOSIDES-DOCUSATE SODIUM 8.6-50 MG PO TABS
1.0000 | ORAL_TABLET | Freq: Two times a day (BID) | ORAL | Status: DC
  Administered 2020-04-17 – 2020-04-18 (×2): 1 via ORAL
  Filled 2020-04-17 (×4): qty 1

## 2020-04-17 MED ORDER — POLYETHYLENE GLYCOL 3350 17 G PO PACK
17.0000 g | PACK | Freq: Every day | ORAL | Status: DC
  Filled 2020-04-17 (×3): qty 1

## 2020-04-17 MED ORDER — PROPOFOL 10 MG/ML IV BOLUS
INTRAVENOUS | Status: AC
  Filled 2020-04-17: qty 20

## 2020-04-17 MED ORDER — DEXMEDETOMIDINE (PRECEDEX) IN NS 20 MCG/5ML (4 MCG/ML) IV SYRINGE
PREFILLED_SYRINGE | INTRAVENOUS | Status: DC | PRN
  Administered 2020-04-17: 8 ug via INTRAVENOUS
  Administered 2020-04-17: 12 ug via INTRAVENOUS

## 2020-04-17 MED ORDER — ALUM & MAG HYDROXIDE-SIMETH 200-200-20 MG/5ML PO SUSP
30.0000 mL | ORAL | Status: DC | PRN
  Administered 2020-04-18 – 2020-04-22 (×5): 30 mL via ORAL
  Filled 2020-04-17 (×6): qty 30

## 2020-04-17 MED ORDER — TOBRAMYCIN SULFATE 1.2 G IJ SOLR
INTRAMUSCULAR | Status: AC
  Filled 2020-04-17: qty 1.2

## 2020-04-17 MED ORDER — CHLORHEXIDINE GLUCONATE CLOTH 2 % EX PADS: 6.0000 | MEDICATED_PAD | Freq: Every day | CUTANEOUS | Status: DC

## 2020-04-17 MED ORDER — FENTANYL CITRATE (PF) 100 MCG/2ML IJ SOLN
INTRAMUSCULAR | Status: DC | PRN
  Administered 2020-04-17 (×6): 50 ug via INTRAVENOUS

## 2020-04-17 MED ORDER — METOPROLOL TARTRATE 5 MG/5ML IV SOLN
INTRAVENOUS | Status: DC | PRN
  Administered 2020-04-17 (×3): 1 mg via INTRAVENOUS

## 2020-04-17 MED ORDER — TRAMADOL HCL 50 MG PO TABS
50.0000 mg | ORAL_TABLET | ORAL | Status: DC | PRN
  Administered 2020-04-19 (×2): 100 mg via ORAL
  Administered 2020-04-19: 50 mg via ORAL
  Administered 2020-04-19 (×2): 100 mg via ORAL
  Filled 2020-04-17 (×4): qty 2
  Filled 2020-04-17: qty 1

## 2020-04-17 MED ORDER — NEOMYCIN-POLYMYXIN B GU 40-200000 IR SOLN
Status: AC
  Filled 2020-04-17: qty 4

## 2020-04-17 MED ORDER — MIDAZOLAM HCL 2 MG/2ML IJ SOLN: 0.5000 mg | Freq: Once | INTRAMUSCULAR | Status: AC

## 2020-04-17 MED ORDER — DEXAMETHASONE SODIUM PHOSPHATE 10 MG/ML IJ SOLN
INTRAMUSCULAR | Status: AC
  Filled 2020-04-17: qty 1

## 2020-04-17 MED ORDER — CALAZIME SKIN PROTECTANT EX PSTE
1.0000 "application " | PASTE | Freq: Four times a day (QID) | CUTANEOUS | Status: DC | PRN
  Filled 2020-04-17: qty 170

## 2020-04-17 MED ORDER — PANTOPRAZOLE SODIUM 40 MG PO TBEC
40.0000 mg | DELAYED_RELEASE_TABLET | Freq: Two times a day (BID) | ORAL | Status: DC
  Administered 2020-04-17 – 2020-04-23 (×12): 40 mg via ORAL
  Filled 2020-04-17 (×12): qty 1

## 2020-04-17 MED ORDER — CEFAZOLIN SODIUM-DEXTROSE 2-4 GM/100ML-% IV SOLN
2.0000 g | INTRAVENOUS | Status: AC
  Administered 2020-04-17 (×2): 2 g via INTRAVENOUS

## 2020-04-17 MED ORDER — CHLORHEXIDINE GLUCONATE 0.12 % MT SOLN
OROMUCOSAL | Status: AC
  Administered 2020-04-17: 15 mL via OROMUCOSAL
  Filled 2020-04-17: qty 15

## 2020-04-17 MED ORDER — PHENYLEPHRINE HCL (PRESSORS) 10 MG/ML IV SOLN
INTRAVENOUS | Status: AC
  Filled 2020-04-17: qty 1

## 2020-04-17 MED ORDER — ACETAMINOPHEN 10 MG/ML IV SOLN
1000.0000 mg | Freq: Four times a day (QID) | INTRAVENOUS | Status: AC
  Administered 2020-04-17 – 2020-04-18 (×4): 1000 mg via INTRAVENOUS
  Filled 2020-04-17 (×4): qty 100

## 2020-04-17 MED ORDER — FLEET ENEMA 7-19 GM/118ML RE ENEM: 1.0000 | ENEMA | Freq: Once | RECTAL | Status: DC | PRN

## 2020-04-17 MED ORDER — METOPROLOL TARTRATE 5 MG/5ML IV SOLN
INTRAVENOUS | Status: AC
  Filled 2020-04-17: qty 5

## 2020-04-17 MED ORDER — DEXAMETHASONE SODIUM PHOSPHATE 10 MG/ML IJ SOLN
INTRAMUSCULAR | Status: DC | PRN
  Administered 2020-04-17: 10 mg via INTRAVENOUS

## 2020-04-17 MED ORDER — APREPITANT 40 MG PO CAPS: 40.0000 mg | ORAL_CAPSULE | Freq: Once | ORAL | Status: AC

## 2020-04-17 MED ORDER — MIDAZOLAM HCL 2 MG/2ML IJ SOLN
INTRAMUSCULAR | Status: AC
  Administered 2020-04-17: 0.5 mg via INTRAVENOUS
  Filled 2020-04-17: qty 2

## 2020-04-17 MED ORDER — CIPROFLOXACIN HCL 500 MG PO TABS
500.0000 mg | ORAL_TABLET | Freq: Two times a day (BID) | ORAL | Status: DC
  Administered 2020-04-17 – 2020-04-20 (×5): 500 mg via ORAL
  Filled 2020-04-17 (×8): qty 1

## 2020-04-17 MED ORDER — ENOXAPARIN SODIUM 30 MG/0.3ML ~~LOC~~ SOLN
30.0000 mg | Freq: Two times a day (BID) | SUBCUTANEOUS | Status: DC
  Administered 2020-04-18 – 2020-04-23 (×11): 30 mg via SUBCUTANEOUS
  Filled 2020-04-17 (×11): qty 0.3

## 2020-04-17 MED ORDER — PROMETHAZINE HCL 25 MG/ML IJ SOLN: 6.2500 mg | INTRAMUSCULAR | Status: DC | PRN

## 2020-04-17 MED ORDER — NEOMYCIN-POLYMYXIN B GU 40-200000 IR SOLN
Status: DC | PRN
  Administered 2020-04-17 (×2): 12 mL
  Administered 2020-04-17: 8 mL

## 2020-04-17 MED ORDER — VANCOMYCIN HCL 1000 MG IV SOLR
INTRAVENOUS | Status: DC | PRN
  Administered 2020-04-17: 1000 mg via TOPICAL
  Administered 2020-04-17: 1000 mg

## 2020-04-17 MED ORDER — BISACODYL 10 MG RE SUPP: 10.0000 mg | Freq: Every day | RECTAL | Status: DC | PRN

## 2020-04-17 MED ORDER — RIFAMPIN 300 MG PO CAPS
300.0000 mg | ORAL_CAPSULE | Freq: Two times a day (BID) | ORAL | Status: DC
  Administered 2020-04-17 – 2020-04-20 (×6): 300 mg via ORAL
  Filled 2020-04-17 (×8): qty 1

## 2020-04-17 MED ORDER — LACTATED RINGERS IV SOLN: INTRAVENOUS | Status: DC

## 2020-04-17 MED ORDER — CEFAZOLIN SODIUM-DEXTROSE 2-4 GM/100ML-% IV SOLN
INTRAVENOUS | Status: AC
  Filled 2020-04-17: qty 100

## 2020-04-17 MED ORDER — HYDROMORPHONE HCL 1 MG/ML IJ SOLN
0.5000 mg | INTRAMUSCULAR | Status: DC | PRN
  Administered 2020-04-17: 1 mg via INTRAVENOUS
  Administered 2020-04-18: 0.5 mg via INTRAVENOUS
  Administered 2020-04-18: 1 mg via INTRAVENOUS
  Filled 2020-04-17 (×3): qty 1

## 2020-04-17 MED ORDER — PHENYLEPHRINE HCL (PRESSORS) 10 MG/ML IV SOLN
INTRAVENOUS | Status: DC | PRN
  Administered 2020-04-17 (×2): 100 ug via INTRAVENOUS
  Administered 2020-04-17 (×3): 200 ug via INTRAVENOUS
  Administered 2020-04-17: 100 ug via INTRAVENOUS
  Administered 2020-04-17: 200 ug via INTRAVENOUS
  Administered 2020-04-17 (×2): 100 ug via INTRAVENOUS

## 2020-04-17 MED ORDER — DEXMEDETOMIDINE (PRECEDEX) IN NS 20 MCG/5ML (4 MCG/ML) IV SYRINGE
PREFILLED_SYRINGE | INTRAVENOUS | Status: AC
  Filled 2020-04-17: qty 5

## 2020-04-17 MED ORDER — LORATADINE 10 MG PO TABS
10.0000 mg | ORAL_TABLET | Freq: Every day | ORAL | Status: DC
  Administered 2020-04-18 – 2020-04-23 (×6): 10 mg via ORAL
  Filled 2020-04-17 (×6): qty 1

## 2020-04-17 MED ORDER — CHLORHEXIDINE GLUCONATE 0.12 % MT SOLN: 15.0000 mL | Freq: Once | OROMUCOSAL | Status: AC

## 2020-04-17 MED ORDER — OCUVITE-LUTEIN PO CAPS
1.0000 | ORAL_CAPSULE | Freq: Two times a day (BID) | ORAL | Status: DC
  Administered 2020-04-17 – 2020-04-19 (×2): 1 via ORAL
  Filled 2020-04-17 (×13): qty 1

## 2020-04-17 MED ORDER — SODIUM CHLORIDE 0.9 % IV SOLN
INTRAVENOUS | Status: DC | PRN
  Administered 2020-04-17: 40 ug/min via INTRAVENOUS

## 2020-04-17 MED ORDER — POLYVINYL ALCOHOL 1.4 % OP SOLN
1.0000 [drp] | Freq: Three times a day (TID) | OPHTHALMIC | Status: DC | PRN
  Filled 2020-04-17: qty 15

## 2020-04-17 MED ORDER — SUGAMMADEX SODIUM 200 MG/2ML IV SOLN
INTRAVENOUS | Status: DC | PRN
  Administered 2020-04-17: 200 mg via INTRAVENOUS

## 2020-04-17 MED ORDER — CARVEDILOL 3.125 MG PO TABS
3.1250 mg | ORAL_TABLET | Freq: Two times a day (BID) | ORAL | Status: DC
  Administered 2020-04-17 – 2020-04-23 (×12): 3.125 mg via ORAL
  Filled 2020-04-17 (×12): qty 1

## 2020-04-17 SURGICAL SUPPLY — 99 items
BLADE OSCILLATING/SAGITTAL (BLADE) ×2
BLADE SAGITTAL 25.0X1.19X90 (BLADE) ×1 IMPLANT
BLADE SAW 70X12.5 (BLADE) ×2 IMPLANT
BLADE SAW 90X13X1.19 OSCILLAT (BLADE) ×2 IMPLANT
BLADE SAW 90X25X1.19 OSCILLAT (BLADE) ×2 IMPLANT
BLADE SAW SGTL 13X75X1.27 (BLADE) ×2 IMPLANT
BLADE SW THK.38XMED LNG THN (BLADE) ×1 IMPLANT
BNDG COHESIVE 4X5 TAN STRL (GAUZE/BANDAGES/DRESSINGS) ×2 IMPLANT
BONE CEMENT GENTAMICIN (Cement) ×4 IMPLANT
BOWL CEMENT MIX W/ADAPTER (MISCELLANEOUS) ×1 IMPLANT
BOWL CEMENT MIXING ADV NOZZLE (MISCELLANEOUS) ×1 IMPLANT
BRACE KNEE POST OP SHORT (BRACE) ×1 IMPLANT
CANISTER SUCT 1200ML W/VALVE (MISCELLANEOUS) ×2 IMPLANT
CANISTER SUCT 3000ML PPV (MISCELLANEOUS) ×1 IMPLANT
CEMENT BONE GENTAMICIN 40 (Cement) IMPLANT
CEMENT HV SMART SET (Cement) ×4 IMPLANT
CNTNR SPEC 2.5X3XGRAD LEK (MISCELLANEOUS)
CNTNR URN SCR LID CUP LEK RST (MISCELLANEOUS) IMPLANT
CONT SPEC 4OZ STER OR WHT (MISCELLANEOUS)
CONT SPEC 4OZ STRL OR WHT (MISCELLANEOUS) ×1
CONTAINER SPEC 2.5X3XGRAD LEK (MISCELLANEOUS) ×1 IMPLANT
COOLER POLAR GLACIER W/PUMP (MISCELLANEOUS) ×2 IMPLANT
COVER BACK TABLE REUSABLE LG (DRAPES) ×2 IMPLANT
COVER WAND RF STERILE (DRAPES) ×2 IMPLANT
CUFF TOURN SGL QUICK 18X4 (TOURNIQUET CUFF) ×1 IMPLANT
CUFF TOURN SGL QUICK 24 (TOURNIQUET CUFF)
CUFF TOURN SGL QUICK 30 (TOURNIQUET CUFF) ×1
CUFF TRNQT CYL 24X4X16.5-23 (TOURNIQUET CUFF) IMPLANT
CUFF TRNQT CYL 30X4X21-28X (TOURNIQUET CUFF) IMPLANT
DRAPE 3/4 80X56 (DRAPES) ×4 IMPLANT
DRAPE FLUOR MINI C-ARM 54X84 (DRAPES) ×1 IMPLANT
DRAPE INCISE IOBAN 66X45 STRL (DRAPES) ×1 IMPLANT
DRAPE POUCH INSTRU U-SHP 10X18 (DRAPES) ×1 IMPLANT
DRSG DERMACEA 8X12 NADH (GAUZE/BANDAGES/DRESSINGS) ×2 IMPLANT
DRSG MEPILEX SACRM 8.7X9.8 (GAUZE/BANDAGES/DRESSINGS) ×2 IMPLANT
DRSG OPSITE POSTOP 4X14 (GAUZE/BANDAGES/DRESSINGS) ×2 IMPLANT
DRSG TEGADERM 4X4.75 (GAUZE/BANDAGES/DRESSINGS) ×1 IMPLANT
DURAPREP 26ML APPLICATOR (WOUND CARE) ×4 IMPLANT
ELECT REM PT RETURN 9FT ADLT (ELECTROSURGICAL) ×2
ELECTRODE REM PT RTRN 9FT ADLT (ELECTROSURGICAL) ×1 IMPLANT
GAUZE SPONGE 4X4 12PLY STRL (GAUZE/BANDAGES/DRESSINGS) ×3 IMPLANT
GLOVE SURG ENC TEXT LTX SZ7.5 (GLOVE) ×2 IMPLANT
GLOVE SURG UNDER LTX SZ8 (GLOVE) ×2 IMPLANT
GOWN STRL REUS W/ TWL LRG LVL3 (GOWN DISPOSABLE) ×2 IMPLANT
GOWN STRL REUS W/TWL LRG LVL3 (GOWN DISPOSABLE) ×5
HANDLE YANKAUER SUCT BULB TIP (MISCELLANEOUS) ×1 IMPLANT
HANDPIECE VERSAJET DEBRIDEMENT (MISCELLANEOUS) ×2 IMPLANT
HEAD CVD RP 3 15 (Head) ×1 IMPLANT
HEMOVAC 400CC 10FR (MISCELLANEOUS) ×2 IMPLANT
HOOD PEEL AWAY FLYTE STAYCOOL (MISCELLANEOUS) ×5 IMPLANT
IRRIGATION STRYKERFLOW (MISCELLANEOUS) ×1 IMPLANT
IRRIGATION SURGIPHOR STRL (IV SOLUTION) ×1 IMPLANT
IRRIGATOR STRYKERFLOW (MISCELLANEOUS)
KIT STIMULAN RAPID CURE 5CC (Orthopedic Implant) ×1 IMPLANT
KIT TURNOVER KIT A (KITS) ×3 IMPLANT
KNIFE SCULPS 14X20 (INSTRUMENTS) ×2 IMPLANT
MANIFOLD NEPTUNE II (INSTRUMENTS) ×4 IMPLANT
NDL SAFETY ECLIPSE 18X1.5 (NEEDLE) ×1 IMPLANT
NDL SPNL 18GX3.5 QUINCKE PK (NEEDLE) ×1 IMPLANT
NDL SPNL 20GX3.5 QUINCKE YW (NEEDLE) ×2 IMPLANT
NEEDLE HYPO 18GX1.5 SHARP (NEEDLE) ×1
NEEDLE SPNL 18GX3.5 QUINCKE PK (NEEDLE) ×2 IMPLANT
NEEDLE SPNL 20GX3.5 QUINCKE YW (NEEDLE) ×6 IMPLANT
NS IRRIG 1000ML POUR BTL (IV SOLUTION) ×2 IMPLANT
PACK TOTAL KNEE (MISCELLANEOUS) ×2 IMPLANT
PAD ABD DERMACEA PRESS 5X9 (GAUZE/BANDAGES/DRESSINGS) ×2 IMPLANT
PAD CAST CTTN 4X4 STRL (SOFTGOODS) IMPLANT
PAD WRAPON POLAR KNEE (MISCELLANEOUS) ×1 IMPLANT
PADDING CAST COTTON 4X4 STRL (SOFTGOODS) ×3
PENCIL SMOKE EVACUATOR (MISCELLANEOUS) ×2 IMPLANT
PULSAVAC PLUS IRRIG FAN TIP (DISPOSABLE) ×2
SOL .9 NS 3000ML IRR  AL (IV SOLUTION) ×1
SOL .9 NS 3000ML IRR UROMATIC (IV SOLUTION) ×1 IMPLANT
SPONGE DRAIN TRACH 4X4 STRL 2S (GAUZE/BANDAGES/DRESSINGS) ×1 IMPLANT
SPONGE LAP 18X18 RF (DISPOSABLE) ×4 IMPLANT
STAPLER SKIN PROX 35W (STAPLE) ×3 IMPLANT
STOCKINETTE BIAS CUT 3 980034 (MISCELLANEOUS) ×1 IMPLANT
STOCKINETTE BIAS CUT 6 980064 (GAUZE/BANDAGES/DRESSINGS) ×1 IMPLANT
STOCKINETTE M/LG 89821 (MISCELLANEOUS) ×1 IMPLANT
SUCTION FRAZIER HANDLE 10FR (MISCELLANEOUS) ×1
SUCTION TUBE FRAZIER 10FR DISP (MISCELLANEOUS) ×1 IMPLANT
SUT ETHILON 2 0 FS 18 (SUTURE) IMPLANT
SUT MNCRL 0 1X36 CT-1 (SUTURE) ×1 IMPLANT
SUT MON AB 2-0 CT1 36 (SUTURE) ×3 IMPLANT
SUT MONOCRYL 0 (SUTURE) ×2
SUT PROLENE 1 CT 1 30 (SUTURE) ×3 IMPLANT
SUT VIC AB 0 CT1 36 (SUTURE) ×1 IMPLANT
SUT VIC AB 1 CT1 36 (SUTURE) ×2 IMPLANT
SUT VIC AB 2-0 CT1 (SUTURE) ×1 IMPLANT
SWAB CULTURE AMIES ANAERIB BLU (MISCELLANEOUS) ×19 IMPLANT
SYR 20ML LL LF (SYRINGE) ×2 IMPLANT
SYR 30ML LL (SYRINGE) ×5 IMPLANT
TIP BRUSH PULSAVAC PLUS 24.33 (MISCELLANEOUS) ×2 IMPLANT
TIP FAN IRRIG PULSAVAC PLUS (DISPOSABLE) ×1 IMPLANT
TOWEL OR 17X26 4PK STRL BLUE (TOWEL DISPOSABLE) ×2 IMPLANT
TOWER CARTRIDGE SMART MIX (DISPOSABLE) ×2 IMPLANT
TRAY FOLEY MTR SLVR 16FR STAT (SET/KITS/TRAYS/PACK) ×2 IMPLANT
TUBING CONNECTING 10 (TUBING) ×1 IMPLANT
WRAPON POLAR PAD KNEE (MISCELLANEOUS) ×4

## 2020-04-17 NOTE — Anesthesia Procedure Notes (Signed)
Procedure Name: Intubation Date/Time: 04/17/2020 11:49 AM Performed by: Jonna Clark, CRNA Pre-anesthesia Checklist: Patient identified, Patient being monitored, Timeout performed, Emergency Drugs available and Suction available Patient Re-evaluated:Patient Re-evaluated prior to induction Oxygen Delivery Method: Circle system utilized Preoxygenation: Pre-oxygenation with 100% oxygen Induction Type: IV induction Ventilation: Mask ventilation without difficulty Laryngoscope Size: 3 and McGraph Grade View: Grade I Tube type: Oral Tube size: 7.0 mm Number of attempts: 1 Airway Equipment and Method: Stylet Placement Confirmation: ETT inserted through vocal cords under direct vision,  positive ETCO2 and breath sounds checked- equal and bilateral Secured at: 21 cm Tube secured with: Tape Dental Injury: Teeth and Oropharynx as per pre-operative assessment  Difficulty Due To: Difficulty was anticipated and Difficult Airway- due to anterior larynx

## 2020-04-17 NOTE — Anesthesia Preprocedure Evaluation (Signed)
Anesthesia Evaluation  Patient identified by MRN, date of birth, ID band Patient awake    Reviewed: Allergy & Precautions, H&P , NPO status , Patient's Chart, lab work & pertinent test results  History of Anesthesia Complications (+) PONV and history of anesthetic complications  Airway Mallampati: III  TM Distance: <3 FB Neck ROM: limited    Dental  (+) Chipped   Pulmonary neg shortness of breath, asthma , sleep apnea , COPD, former smoker,    Pulmonary exam normal        Cardiovascular Exercise Tolerance: Good hypertension, + Peripheral Vascular Disease  Normal cardiovascular exam     Neuro/Psych PSYCHIATRIC DISORDERS negative neurological ROS     GI/Hepatic Neg liver ROS, GERD  Medicated and Controlled,  Endo/Other  negative endocrine ROS  Renal/GU      Musculoskeletal   Abdominal   Peds  Hematology negative hematology ROS (+)   Anesthesia Other Findings Patient reports baseline nausea from her current antibiotic regimen   Past Medical History: No date: Anemia No date: Anxiety No date: Asthma     Comment:  seasonal  No date: GERD (gastroesophageal reflux disease) No date: Hypertension No date: PONV (postoperative nausea and vomiting) No date: Seasonal allergies No date: Sleep apnea     Comment:  does not uses CPAP machine anymore   Past Surgical History: No date: APPENDECTOMY No date: CESAREAN SECTION No date: CHOLECYSTECTOMY No date: DILATION AND CURETTAGE OF UTERUS 02/22/2019: ESOPHAGOGASTRODUODENOSCOPY; N/A     Comment:  Procedure: ESOPHAGOGASTRODUODENOSCOPY (EGD);  Surgeon:               Toledo, Benay Pike, MD;  Location: [facility];                Service: Gastroenterology;  Laterality: N/A; 02/22/2019: FLEXIBLE SIGMOIDOSCOPY; N/A     Comment:  Procedure: FLEXIBLE SIGMOIDOSCOPY;  Surgeon: Toledo,               Benay Pike, MD;  Location: [facility] END[facility];  Service:                Gastroenterology;  Laterality: N/A; 11/12/2019: HARDWARE REMOVAL; Right     Comment:  Procedure: Right ankle hardware removal;  Surgeon: Hessie Knows, MD;  Location: [facility] ORS;  Service: Orthopedics;               Laterality: Right; No date: JOINT REPLACEMENT 03/14/2019: ORIF ANKLE FRACTURE; Right     Comment:  Procedure: OPEN REDUCTION INTERNAL FIXATION (ORIF) ANKLE              FRACTURE, MEDIAL MALLEOLUS;  Surgeon: Hessie Knows, MD;               Location: [facility] OR[facility];  Service: Orthopedics;  Laterality:               Right; 02/08/2019: ORIF ANKLE FRACTURE; Right     Comment:  Procedure: OPEN REDUCTION INTERNAL FIXATION (ORIF) ANKLE              FRACTURE, post malleolus;  Surgeon: Hessie Knows, MD;                Location: [facility] OR[facility];  Service: Orthopedics;  Laterality:               Right; 2008: REPLACEMENT TOTAL KNEE; Bilateral     Comment:  2009 01/2020: SCAR DEBRIDEMENT OF TOTAL KNEE 02/08/2019: SYNDESMOSIS REPAIR; Right  Comment:  Procedure: SYNDESMOSIS REPAIR;  Surgeon: Hessie Knows,               MD;  Location: [facility] ORS;  Service: Orthopedics;                Laterality: Right; 02/26/2019: TEE WITHOUT CARDIOVERSION; N/A     Comment:  Procedure: TRANSESOPHAGEAL ECHOCARDIOGRAM (TEE);                Surgeon: Corey Skains, MD;  Location: [facility] ORS;                Service: Cardiovascular;  Laterality: N/A; 01/24/2020: TOTAL KNEE REVISION WITH SCAR DEBRIDEMENT/PATELLA  REVISION WITH POLY EXCHANGE; Right     Comment:  Procedure: TOTAL KNEE REVISION WITH SCAR               DEBRIDEMENT/PATELLA REVISION WITH POLY EXCHANGE;                Surgeon: Dereck Leep, MD;  Location: [facility] ORS;                Service: Orthopedics;  Laterality: Right; No date: TUMOR REMOVAL     Comment:  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  BMI    Body Mass Index: 28.89 kg/m      Reproductive/Obstetrics negative OB ROS                             Anesthesia  Physical Anesthesia Plan  ASA: III  Anesthesia Plan: General ETT   Post-op Pain Management:    Induction: Intravenous  PONV Risk Score and Plan: Ondansetron, Dexamethasone, Midazolam and Treatment may vary due to age or medical condition  Airway Management Planned: Oral ETT  Additional Equipment:   Intra-op Plan:   Post-operative Plan: Extubation in OR  Informed Consent: I have reviewed the patients History and Physical, chart, labs and discussed the procedure including the risks, benefits and alternatives for the proposed anesthesia with the patient or authorized representative who has indicated his/her understanding and acceptance.     Dental Advisory Given  Plan Discussed with: Anesthesiologist, CRNA and Surgeon  Anesthesia Plan Comments: (Patient consented for risks of anesthesia including but not limited to:  - adverse reactions to medications - damage to eyes, teeth, lips or other oral mucosa - nerve damage due to positioning  - sore throat or hoarseness - Damage to heart, brain, nerves, lungs, other parts of body or loss of life  Patient voiced understanding.)        Anesthesia Quick Evaluation

## 2020-04-17 NOTE — Op Note (Signed)
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   ASSISTANT: Cassell Smiles, PA-C  ANESTHESIA: general  ESTIMATED BLOOD LOSS: 150 mL  FLUIDS REPLACED: 2200 mL of crystalloid  TOURNIQUET TIME: Ankle-74 minutes   knee-112 minutes  DRAINS: 2 medium Hemovac drains  INDICATIONS FOR SURGERY: ALANNIE AMODIO is a 75 y.o. year old female who has a complicated history following a right ankle fracture approximately 15 months ago.  She underwent open reduction and internal fixation of the ankle but had an episode of sepsis requiring hospitalization.  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  More recently, she apparently seeded the right total knee arthroplasty and underwent irrigation debridement of her knee.  Cultures of the ankle and knee as well as the source of the sepsis were all consistent with methicillin sensitive staph aureus.  She has had persistent drainage from both the ankle and the knee.. After discussion of the risks and benefits of surgical intervention, the patient expressed understanding of the risks benefits and agree with plans for removal of hardware from the right ankle and removal of total knee implants from the right knee with placement of an antibiotic impregnated cement spacer.Marland Kitchen   PROCEDURE IN DETAIL: The patient was brought into the operating room and, after  adequate general endotracheal anesthesia was achieved, the patient was placed in a prone position.  All bony prominences were well-padded.  The patient's right foot and ankle were cleaned and prepped with alcohol and DuraPrep and draped in usual sterile fashion.  A "timeout" was performed as per usual protocol.  A sterile tourniquet was applied to the right calf.  The foot and ankle were elevated and the tourniquet was inflated to 250 mmHg.  A posterior lateral incision was made to the ankle in line with the previous surgical scar.  Care was taken to protect the sural nerve.  Dissection was carried down with retraction of the peroneal tendons and retraction of the hallucis.  The one third tubular plate was identified along the posterior aspect of the tibia.  Seven 3.5 millimeter screws were removed followed by removal of the plate.  The wound was debrided with curettes and rongeurs.  Swabs were obtained for culture and sensitivity.  Dissection was continued laterally to reach the plate and buttons from the "tight rope" construct.  These were removed without difficulty.  The site was irrigated with copious amounts of normal saline with antibiotic solution. Stimulan beads were prepared using a combination of vancomycin and gentamicin.  The beads were placed in the wound bed.  The tourniquet was deflated after total tourniquet time of 74 minutes.  Good hemostasis was noted.  The posterior lateral wound was closed using #2-0 Monocryl and staples.  Next, attention was directed to the medial aspect of the ankle.  The 4.0 mm cannulated screw that had been used for fixation of the medial malleolus was identified using FluoroScan.  A limited incision was made over the site and  dissection was carried down to the screw head.  The screw was removed without difficulty.  Swab was obtained for culture.  Finally, the draining wound to the medial aspect of the ankle was debrided using curettes and rongeurs.  Swabs were obtained for  cultures.  The margins were mobilized and the lesion was closed using #2-0 Vicryl and skin staples.  A sterile dressing was applied to the ankle.  The patient was then transferred to a stretcher and subsequently back to the OR bed in a supine position.  A tourniquet was applied to the upper right thigh.  The patient's right knee and leg were cleaned and prepped with alcohol and DuraPrep and draped usual sterile fashion.  A second "timeout" was performed.  The right lower extremity was elevated and the tourniquet was inflated to 300 mmHg.  An anterior longitudinal incision was made in line with the previous surgical incision.  Approximately 5 mL of cloudy fluid was aspirated from the knee and submitted for complete cell count and stat Gram stain.  It was elected to proceed with plans for removal of the total knee implants.  A medial parapatellar approach was utilized.  The deep fibers of medial collateral ligament were elevated in subperiosteal fashion off the medial flare of the tibia so as to maintain a continuous soft tissue sleeve.  The patella was subluxed laterally.  The polyethylene insert was removed without difficulty.  Inspection of the knee demonstrated abundant cloudy/purulent fluid.  An extensive synovectomy was performed sharply using electrocautery so as to debride the suprapatellar pouch, medial and lateral gutters.  A curette was then used to better visualize the implant bone interface around the femoral and tibial implants.  Thin flexible osteotomes were used along the implant cement interface to mobilize the femoral implant.  This was done in a systematic fashion involving first the anterior flange followed by the distal aspect and finally the posterior aspect of the implant.  The femoral component was removed with little sacrifice of underlying bone.  Swabs were obtained of the underlying tissue.  Next, attention was directed to the tibial component.  TPS thin saw was used at the implant cement  interface first anteriorly and then extending along the medial lateral aspects.  Back cutting osteotomes were used to continue the dissection posteriorly.  The tibial implant was then tapped up and out of the proximal tibia with an intact cement mantle.  Swabs were obtained of the tibial canal.  Next, the TPS saw was used to rake the interface between the patella and the patellar implant.  The knee was debrided with copious amounts of normal saline using the Versajet.  The knee was then irrigated with an additional 3000 mL of normal saline with antibiotic solution using pulsatile lavage.  Surgiphor was then used to soak the wound for approximately 5 minutes.  The tourniquet was deflated after total tourniquet time of 112 minutes.  Two 40 g packs of polymethylmethacrylate cement were prepared with a total of 2 g of vancomycin and 7.2 g of tobramycin and then placed in the femoral mold and applied to the distal femur.  After adequate curing of the cement, a second batch of polymethylmethacrylate cement with gentamicin was prepared and placed on the proximal tibia with placement of a 15 mm rotating platform polyethylene implant.  The knee was then placed in full extension and the cement allowed to cure.  The knee was finally irrigated with an additional 3000 mL of normal saline with antibiotic solution  and suctioned dry.  2 medium Hemovac drains were placed wound bed brought through separate stab incision be attached to Hemovac reservoir.  The medial parapatellar portion incision was reapproximated using interrupted sutures of #1 Prolene.  Subcutaneous tissue was approximated layers using first #0 Monocryl followed by #2-0 Monocryl.  Skin was closed with skin staples.  Sterile dressing was applied followed by application of a knee range of motion brace locked in full extension.  The patient tolerated procedure well.  She was transported to the recovery room stable condition.   Alverna Fawley P. Holley Bouche M.D.

## 2020-04-17 NOTE — Transfer of Care (Signed)
Immediate Anesthesia Transfer of Care Note  Patient: Jasmine Morrow  Procedure(s) Performed: HARDWARE REMOVAL FROM RIGHT ANKLE AND RIGHT KNEE (Right )  Patient Location: PACU  Anesthesia Type:General  Level of Consciousness: sedated  Airway & Oxygen Therapy: Patient Spontanous Breathing and Patient connected to face mask oxygen  Post-op Assessment: Report given to RN and Post -op Vital signs reviewed and stable  Post vital signs: Reviewed  Last Vitals:  Vitals Value Taken Time  BP    Temp    Pulse 104 04/17/20 1922  Resp    SpO2 100 % 04/17/20 1922  Vitals shown include unvalidated device data.  Last Pain:  Vitals:   04/17/20 0903  TempSrc: Temporal  PainSc: 0-No pain         Complications: No complications documented.

## 2020-04-17 NOTE — H&P (Signed)
The patient has been re-examined, and the chart reviewed, and there have been no interval changes to the documented history and physical.    The risks, benefits, and alternatives have been discussed at length. The patient expressed understanding of the risks benefits and agreed with plans for surgical intervention.  Miya Luviano P. Towanda Hornstein, Jr. M.D.    

## 2020-04-17 NOTE — Consult Note (Signed)
Medical Consultation   Jasmine Morrow  VXB:[id number]  DOB: Nov 14, 1945  DOA: 04/17/2020  PCP: Rusty Aus, MD (Confirm with patient/family/NH records and if not entered, this has to be entered at [facility] point of entry)  Requesting physician: Dr Kayleen Memos, anesthesiologist  Reason for consultation: Chest pain   History of Present Illness: Jasmine Morrow is an 75 y.o. female with a history of asthma, GERD, OSA on CPAP, anxiety, COPD, multiple lung nodules, as well as history of ICU admission for GI bleed with hemorrhagic shock who is being seen in consultation for chest pain which she developed postoperatively.  Consultation was requested by anesthesiologist Dr. Kayleen Memos.  Patient underwent prolonged orthopedic surgery lasting around 7 hours for removal of hardware from right ankle and right knee indicated for chronic right knee periprosthetic infection.  2600 mils of IV fluid was administered intraoperatively and blood loss was 150 mils. Patient describes the chest pain as a heaviness in the center of her chest associated with nausea, nonradiating and of moderate intensity that she started feeling after coming around from anesthesia.  She has had a few episodes intermittently in the past felt mostly on awakening in the morning and the episodes are usually short-lived lasting a few minutes and relieved on its own.  States it feels like indigestion.  She denies associated lightheadedness, shortness of breath or palpitations, vomiting or diaphoresis.  Had a cardiac catheterization in 2013 that showed normal coronaries but does not follow-up regularly with a cardiologist.  Had a TEE January 2021 that showed EF 60 to 65%. Patient admits to being worried that she will eventually lose her leg and she has had multiple interventions on her leg.  Review of Systems:   As per HPI otherwise 10 point review of systems negative.   Past Medical History: Past Medical History:  Diagnosis Date    Anemia    Anxiety    Asthma    seasonal    GERD (gastroesophageal reflux disease)    Hypertension    PONV (postoperative nausea and vomiting)    Seasonal allergies    Sleep apnea    does not uses CPAP machine anymore     Past Surgical History: Past Surgical History:  Procedure Laterality Date   APPENDECTOMY     CESAREAN SECTION     CHOLECYSTECTOMY     DILATION AND CURETTAGE OF UTERUS     ESOPHAGOGASTRODUODENOSCOPY N/A 02/22/2019   Procedure: ESOPHAGOGASTRODUODENOSCOPY (EGD);  Surgeon: Toledo, Benay Pike, MD;  Location: [facility] ENDOSCOPY;  Service: Gastroenterology;  Laterality: N/A;   FLEXIBLE SIGMOIDOSCOPY N/A 02/22/2019   Procedure: FLEXIBLE SIGMOIDOSCOPY;  Surgeon: Toledo, Benay Pike, MD;  Location: [facility] ENDOSCOPY;  Service: Gastroenterology;  Laterality: N/A;   HARDWARE REMOVAL Right 11/12/2019   Procedure: Right ankle hardware removal;  Surgeon: Hessie Knows, MD;  Location: [facility] ORS;  Service: Orthopedics;  Laterality: Right;   JOINT REPLACEMENT     ORIF ANKLE FRACTURE Right 03/14/2019   Procedure: OPEN REDUCTION INTERNAL FIXATION (ORIF) ANKLE FRACTURE, MEDIAL MALLEOLUS;  Surgeon: Hessie Knows, MD;  Location: [facility] ORS;  Service: Orthopedics;  Laterality: Right;   ORIF ANKLE FRACTURE Right 02/08/2019   Procedure: OPEN REDUCTION INTERNAL FIXATION (ORIF) ANKLE FRACTURE, post malleolus;  Surgeon: Hessie Knows, MD;  Location: [facility] ORS;  Service: Orthopedics;  Laterality: Right;   REPLACEMENT TOTAL KNEE Bilateral 2008   2009   SCAR DEBRIDEMENT OF TOTAL KNEE  01/2020  SYNDESMOSIS REPAIR Right 02/08/2019   Procedure: SYNDESMOSIS REPAIR;  Surgeon: Hessie Knows, MD;  Location: [facility] OR[facility];  Service: Orthopedics;  Laterality: Right;   TEE WITHOUT CARDIOVERSION N/A 02/26/2019   Procedure: TRANSESOPHAGEAL ECHOCARDIOGRAM (TEE);  Surgeon: Corey Skains, MD;  Location: [facility] OR[facility];  Service: Cardiovascular;  Laterality: N/A;   TOTAL KNEE REVISION WITH SCAR DEBRIDEMENT/PATELLA  REVISION WITH POLY EXCHANGE Right 01/24/2020   Procedure: TOTAL KNEE REVISION WITH SCAR DEBRIDEMENT/PATELLA REVISION WITH POLY EXCHANGE;  Surgeon: Dereck Leep, MD;  Location: [facility] OR[facility];  Service: Orthopedics;  Laterality: Right;   TUMOR REMOVAL     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 Her smoking use included cigarettes. She has a 60.00 pack-year smoking history. She has never used smokeless tobacco. She reports that she does not drink alcohol and does not use drugs.   Family History: Family History  Problem Relation Age of Onset   Bone cancer Mother    COPD Father     Unacceptable: Noncontributory, unremarkable, or negative. Acceptable: Family history reviewed and not pertinent (If you reviewed it)   Physical Exam: Vitals:   04/17/20 2052 04/17/20 2056 04/17/20 2100 04/17/20 2115  BP:   (!) 100/56 97/66  Pulse: (!) 110 (!) 115 (!) 113 (!) 114  Resp: 16 18 18 14   Temp:      TempSrc:      SpO2: 100% 100% 100% 100%  Weight:      Height:        Constitutional:   Mildly somnolent due to having recently awoken from anesthesia but alert once awakened, oriented x3 and in no acute distress Eyes: PERLA, EOMI, irises appear normal, anicteric sclera,  ENMT: external ears and nose appear normal,          Lips appears normal, oropharynx mucosa, tongue, posterior pharynx appear normal  Neck: neck appears normal, no masses, normal  ROM, no thyromegaly, no JVD  CVS: S1-S2 clear, no murmur rubs or gallops, no LE edema, normal pedal pulses  Respiratory:  clear to auscultation bilaterally, no wheezing, rales or rhonchi. Respiratory effort normal. No accessory muscle use.  Abdomen: soft nontender, nondistended, normal bowel sounds, no hepatosplenomegaly, no hernias  Musculoskeletal: : Right knee and ankle in bandages.  No calf tenderness Neuro: Cranial nerves II-XII intact, strength, sensation, reflexes Psych: judgement and insight appear normal, stable mood and affect, mental status Skin: no rashes or lesions or ulcers, no induration or nodules   Data reviewed:  I have personally reviewed following labs and imaging studies Labs:  CBC: No results for input(s): WBC, NEUTROABS, HGB, HCT, MCV, PLT in the last 168 hours.  Basic Metabolic Panel: No results for input(s): NA, K, CL, CO2, GLUCOSE, BUN, CREATININE, CALCIUM, MG, PHOS in the last 168 hours. GFR CrCl cannot be calculated (Patient's most recent lab result is older than the maximum 21  days allowed.). Liver Function Tests: No results for input(s): AST, ALT, ALKPHOS, BILITOT, PROT, ALBUMIN in the last 168 hours. No results for input(s): LIPASE, AMYLASE in the last 168 hours. No results for input(s): AMMONIA in the last 168 hours. Coagulation profile No results for input(s): INR, PROTIME in the last 168 hours.  Cardiac Enzymes: No results for input(s): CKTOTAL, CKMB, CKMBINDEX, TROPONINI in the last 168 hours. BNP: Invalid input(s): POCBNP CBG: No results for input(s): GLUCAP in the last 168 hours. D-Dimer No results for input(s): DDIMER in the last 72 hours. Hgb A1c No results for input(s): HGBA1C in the last 72 hours. Lipid Profile No results for input(s): CHOL, HDL, LDLCALC, TRIG, CHOLHDL, LDLDIRECT in the last 72 hours. Thyroid function studies No results for input(s): TSH, T4TOTAL, T3FREE, THYROIDAB in the last 72 hours.  Invalid input(s): FREET3 Anemia  work up No results for input(s): VITAMINB12, FOLATE, FERRITIN, TIBC, IRON, RETICCTPCT in the last 72 hours. Urinalysis    Component Value Date/Time   COLORURINE STRAW (A) 01/24/2020 1449   APPEARANCEUR HAZY (A) 01/24/2020 1449   LABSPEC 1.005 01/24/2020 1449   PHURINE 5.0 01/24/2020 1449   GLUCOSEU NEGATIVE 01/24/2020 1449   HGBUR NEGATIVE 01/24/2020 1449   BILIRUBINUR NEGATIVE 01/24/2020 1449   KETONESUR NEGATIVE 01/24/2020 1449   PROTEINUR NEGATIVE 01/24/2020 1449   NITRITE NEGATIVE 01/24/2020 1449   LEUKOCYTESUR SMALL (A) 01/24/2020 1449     Microbiology Recent Results (from the past 240 hour(s))  SARS Coronavirus 2 by RT PCR (hospital order, performed in [place] hospital lab) Nasopharyngeal Nasopharyngeal Swab     Status: None   Collection Time: 04/17/20  9:01 AM   Specimen: Nasopharyngeal Swab  Result Value Ref Range Status   SARS Coronavirus 2 NEGATIVE NEGATIVE Final    Comment: (NOTE) SARS-CoV-2 target nucleic acids are NOT DETECTED.  The SARS-CoV-2 RNA is generally detectable in upper and lower respiratory specimens during the acute phase of infection. The lowest concentration of SARS-CoV-2 viral copies this assay can detect is 250 copies / mL. A negative result does not preclude SARS-CoV-2 infection and should not be used as the sole basis for treatment or other patient management decisions.  A negative result may occur with improper specimen collection / handling, submission of specimen other than nasopharyngeal swab, presence of viral mutation(s) within the areas targeted by this assay, and inadequate number of viral copies (<250 copies / mL). A negative result must be combined with clinical observations, patient history, and epidemiological information.  Fact Sheet for Patients:   StrictlyIdeas.no  Fact Sheet for Healthcare Providers: BankingDealers.co.za  This test is not yet approved or  cleared by the  [place] FDA and has been authorized for detection and/or diagnosis of SARS-CoV-2 by FDA under an Emergency Use Authorization (EUA).  This EUA will remain in effect (meaning this test can be used) for the duration of the COVID-19 declaration under Section 564(b)(1) of the Act, 21 U.S.C. section 360bbb-3(b)(1), unless the authorization is terminated or revoked sooner.  Performed at [facility], [address], [place], [place] [address]   Surgical pcr screen     Status: None   Collection Time: 04/17/20  9:38 AM   Specimen: Nasal Mucosa; Nasal Swab  Result Value Ref Range Status   MRSA, PCR NEGATIVE NEGATIVE Final   Staphylococcus aureus NEGATIVE NEGATIVE Final    Comment: (NOTE) The Xpert SA Assay (FDA approved for NASAL specimens in patients 41 years of age and older), is one component of  a comprehensive surveillance program. It is not intended to diagnose infection nor to guide or monitor treatment. Performed at [facility], [address] [place], [place] [address]   Gram stain     Status: None   Collection Time: 04/17/20  1:00 PM   Specimen: PATH Other; Tissue  Result Value Ref Range Status   Specimen Description ANKLE POSTERIOR LATERAL ANKLE RIGHT PLATE  Final   Special Requests NONE  Final   Gram Stain   Final    NO ORGANISMS SEEN FEW WBC SEEN Performed at [facility], [address] [place], [place] [address]    Report Status 04/17/2020 FINAL  Final  Gram stain     Status: None   Collection Time: 04/17/20  1:16 PM   Specimen: PATH Other; Tissue  Result Value Ref Range Status   Specimen Description ANKLE  Final   Special Requests NONE  Final   Gram Stain   Final    NO ORGANISMS SEEN FEW WBC SEEN Performed at [facility], [address] [place], [place] [address]    Report Status 04/17/2020 FINAL  Final  Gram stain     Status: None   Collection Time: 04/17/20  1:25 PM   Specimen: PATH Other; Tissue  Result  Value Ref Range Status   Specimen Description WOUND RIGHT DISTAL FIBULA  Final   Special Requests NONE  Final   Gram Stain   Final    NO ORGANISMS SEEN FEW WBC SEEN Performed at [facility], [address], [place], [place] [address]    Report Status 04/17/2020 FINAL  Final  Aerobic/Anaerobic Culture w Gram Stain (surgical/deep wound)     Status: None (Preliminary result)   Collection Time: 04/17/20  1:25 PM   Specimen: PATH Other; Tissue  Result Value Ref Range Status   Specimen Description   Final    WOUND RIGHT DISTAL FIBULA Performed at [facility], [place] [address], [place], [place] [address]    Special Requests   Final    NONE Performed at [facility], [address] [place], [place] [address]    Gram Stain PENDING  Incomplete   Culture PENDING  Incomplete   Report Status PENDING  Incomplete  Gram stain     Status: None   Collection Time: 04/17/20  2:24 PM   Specimen: PATH Other; Tissue  Result Value Ref Range Status   Specimen Description WOUND  Final   Special Requests MEDIAL RIGHT SCREW  Final   Gram Stain   Final    NO ORGANISMS SEEN FEW WBC SEEN RESULT CALLED TO, READ BACK BY AND VERIFIED WITH: ADRIENNE ALLRED AT 5784 ON 04/17/20 BY SS Performed at [facility], [address], [place], [place] [address]    Report Status 04/17/2020 FINAL  Final  Gram stain     Status: None   Collection Time: 04/17/20  2:29 PM   Specimen: PATH Other; Tissue  Result Value Ref Range Status   Specimen Description WOUND  Final   Special Requests MEDIAL RIGHT ANKLE  Final   Gram Stain   Final    NO ORGANISMS SEEN RARE WBC SEEN RESULT CALLED TO, READ BACK BY AND VERIFIED WITH: ADRIENNE ALLRED AT 5284 ON 04/17/20 BY SS Performed at [facility], [address], [place],  [address]    Report Status 04/17/2020 FINAL  Final  Gram stain     Status: None   Collection Time: 04/17/20  3:51 PM   Specimen: PATH Other; Tissue  Result  Value Ref Range Status   Specimen Description TISSUE  Final   Special Requests RIGHT KNEE ASPIRATION  Final   Gram Stain   Final    NO ORGANISMS SEEN MANY WBC SEEN Performed at [facility], [address] [place], [place] [address]    Report Status 04/17/2020 FINAL  Final  Gram stain     Status: None   Collection Time: 04/17/20  3:58 PM   Specimen: PATH Other; Tissue  Result Value Ref Range Status   Specimen Description KNEE KNEE  Final   Special Requests NONE  Final   Gram Stain   Final    NO ORGANISMS SEEN FEW WBC SEEN Performed at [facility], [address] [place], [place] [address]    Report Status 04/17/2020 FINAL  Final  Gram stain     Status: None   Collection Time: 04/17/20  3:58 PM   Specimen: PATH Other; Tissue  Result Value Ref Range Status   Specimen Description WOUND  Final   Special Requests NONE  Final   Gram Stain   Final    NO ORGANISMS SEEN FEW WBC SEEN Performed at [facility], [address] [place], [place] [address]    Report Status 04/17/2020 FINAL  Final  Gram stain     Status: None   Collection Time: 04/17/20  4:24 PM   Specimen: PATH Other; Tissue  Result Value Ref Range Status   Specimen Description TISSUE  Final   Special Requests RIGHT FEMUR UNDER IMPLANT  Final   Gram Stain   Final    NO ORGANISMS SEEN FEW WBC SEEN Performed at [facility], [address] [place], [place] [address]    Report Status 04/17/2020 FINAL  Final  Gram stain     Status: None   Collection Time: 04/17/20  4:56 PM   Specimen: PATH Other; Tissue  Result Value Ref Range Status   Specimen Description TISSUE  Final   Special Requests TIBIAL CANAL  Final   Gram Stain   Final    NO ORGANISMS SEEN MANY WBC SEEN Performed at [facility], [address], [place], [place] [address]    Report Status 04/17/2020 FINAL  Final       Inpatient Medications:   Scheduled Meds:  [id number]       Continuous  Infusions:  lactated ringers 10 mL/hr at 04/17/20 9381     Radiological Exams on Admission: DG Ankle 2 Views Right  Result Date: 04/17/2020 CLINICAL DATA:  Hardware removal EXAM: RIGHT ANKLE - 2 VIEW COMPARISON:  January 22, 2020 FINDINGS: Surgical hardware is been removed from the tibia and fibula. An of attic beads are identified. Skin staples are noted. IMPRESSION: Surgical hardware has been removed as above. Osteopenia. Skin staples. Antibiotic beads identified. Electronically Signed   By: Dorise Bullion III M.D   On: 04/17/2020 20:18   DG Knee Right Port  Result Date: 04/17/2020 CLINICAL DATA:  Hardware removal. EXAM: PORTABLE RIGHT KNEE - 1-2 VIEW COMPARISON:  Preoperative radiograph 01/22/2020 FINDINGS: Removal of right knee arthroplasty with placement of spacer. No periprosthetic lucency or fracture. There is been patellar resurfacing. Surgical drain is in place. Anterior skin staples with postsurgical edema soft tissues. IMPRESSION: Removal of right knee arthroplasty with placement of spacer. No immediate postoperative complication. Electronically Signed   By: Keith Rake M.D.   On: 04/17/2020 20:23   DG MINI C-ARM  IMAGE ONLY  Result Date: 04/17/2020 There is no interpretation for this exam.  This order is for images obtained during a surgical procedure.  Please See "Surgeries" Tab for more information regarding the procedure.    Impression/Recommendations 75 year old female with history of history of asthma, GERD, OSA on CPAP, anxiety, COPD, multiple lung nodules, history of GI bleed, chronic right knee infection being seen in consultation for postoperative chest pain.      Chest pain -Chest pain with both typical and atypical features.  No shortness of breath -EKG with sinus tachycardia and no acute ST-T wave changes.  Patient is not hypoxic troponin pending -No significant past cardiac history.  Normal coronaries on cath in 2013.  Unremarkable TEE January  2021 -Differential includes cardiac, acute PE given prior orthopedic interventions and long duration of current surgery of 7 hours.  Once ruled out etiology could be related to anxiety and indigestion -Patient is tachycardic, but not dyspneic or hypoxic -Was empirically treated with a DuoNeb in PACU with some improvement -Follow troponin, BNP, CBC and CMP and TSH and chest x-ray -Addendum: Labs reassuring with normal troponin and BNP, chest x-ray with no acute disease.  At this time, given no hypoxia or shortness of breath, low suspicion for PE but will continue to monitor.  Deferring to orthopedics for postoperative DVT prophylaxis.    Extrinsic asthma -Not acutely exacerbated -DuoNeb as needed    OSA on CPAP -CPAP if desired    Multiple lung nodules -Follow-up chest x-ray.  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  Wishes to be DNR   Thank you for this consultation.  Our [facility] hospitalist team will follow the patient with you.   Time Spent: 56  Athena Masse M.D. [facility] 04/17/2020, 9:36 PM

## 2020-04-18 ENCOUNTER — Inpatient Hospital Stay (HOSPITAL_COMMUNITY)
Admission: RE | Admit: 2020-04-18 | Discharge: 2020-04-18 | Disposition: A | Discharge status: Home / Self Care | Payer: Medicare Other | Source: Home / Self Care | Attending: Internal Medicine | Admitting: Internal Medicine

## 2020-04-18 DIAGNOSIS — I959 Hypotension, unspecified: Secondary | ICD-10-CM

## 2020-04-18 DIAGNOSIS — R079 Chest pain, unspecified: Secondary | ICD-10-CM

## 2020-04-18 DIAGNOSIS — Z96659 Presence of unspecified artificial knee joint: Secondary | ICD-10-CM

## 2020-04-18 LAB — BASIC METABOLIC PANEL
Anion gap: 7 (ref 5–15)
BUN: 17 mg/dL (ref 8–23)
CO2: 25 mmol/L (ref 22–32)
Calcium: 9.2 mg/dL (ref 8.9–10.3)
Chloride: 105 mmol/L (ref 98–111)
Creatinine, Ser: 1.09 mg/dL — ABNORMAL HIGH (ref 0.44–1.00)
GFR, Estimated: 54 mL/min — ABNORMAL LOW (ref >60)
Glucose, Bld: 117 mg/dL — ABNORMAL HIGH (ref 70–99)
Potassium: 4.9 mmol/L (ref 3.5–5.1)
Sodium: 137 mmol/L (ref 135–145)

## 2020-04-18 LAB — CBC
HCT: 28.7 % — ABNORMAL LOW (ref 36.0–46.0)
Hemoglobin: 8.9 g/dL — ABNORMAL LOW (ref 12.0–15.0)
MCH: 24.1 pg — ABNORMAL LOW (ref 26.0–34.0)
MCHC: 31 g/dL (ref 30.0–36.0)
MCV: 77.8 fL — ABNORMAL LOW (ref 80.0–100.0)
Platelets: 302 10*3/uL (ref 150–400)
RBC: 3.69 MIL/uL — ABNORMAL LOW (ref 3.87–5.11)
RDW: 15.5 % (ref 11.5–15.5)
WBC: 8.8 10*3/uL (ref 4.0–10.5)
nRBC: 0 % (ref 0.0–0.2)

## 2020-04-18 LAB — ECHOCARDIOGRAM COMPLETE
AR max vel: 2.33 cm2
AV Peak grad: 6.6 mmHg
Ao pk vel: 1.28 m/s
Area-P 1/2: 4.1 cm2
Height: 68 in
S' Lateral: 3.19 cm
Weight: 2936 oz

## 2020-04-18 LAB — URINALYSIS, COMPLETE (UACMP) WITH MICROSCOPIC
Bilirubin Urine: NEGATIVE
Glucose, UA: NEGATIVE mg/dL
Ketones, ur: NEGATIVE mg/dL
Nitrite: NEGATIVE
Protein, ur: 30 mg/dL — AB
Specific Gravity, Urine: 1.023 (ref 1.005–1.030)
pH: 5 (ref 5.0–8.0)

## 2020-04-18 LAB — RETICULOCYTES
Immature Retic Fract: 14.5 % (ref 2.3–15.9)
RBC.: 3.59 MIL/uL — ABNORMAL LOW (ref 3.87–5.11)
Retic Count, Absolute: 71.4 10*3/uL (ref 19.0–186.0)
Retic Ct Pct: 2 % (ref 0.4–3.1)

## 2020-04-18 LAB — FOLATE: Folate: 6 ng/mL (ref >5.9)

## 2020-04-18 LAB — IRON AND TIBC
Iron: 23 ug/dL — ABNORMAL LOW (ref 28–170)
Saturation Ratios: 10 % — ABNORMAL LOW (ref 10.4–31.8)
TIBC: 230 ug/dL — ABNORMAL LOW (ref 250–450)
UIBC: 207 ug/dL

## 2020-04-18 LAB — VITAMIN B12: Vitamin B-12: 171 pg/mL — ABNORMAL LOW (ref 180–914)

## 2020-04-18 LAB — FERRITIN: Ferritin: 106 ng/mL (ref 11–307)

## 2020-04-18 MED ORDER — ENSURE MAX PROTEIN PO LIQD
11.0000 [oz_av] | Freq: Two times a day (BID) | ORAL | Status: DC
  Administered 2020-04-19 – 2020-04-22 (×4): 11 [oz_av] via ORAL
  Filled 2020-04-18: qty 330

## 2020-04-18 NOTE — Progress Notes (Signed)
Subjective: 1 Day Post-Op Procedure(s) (LRB): HARDWARE REMOVAL FROM RIGHT ANKLE AND RIGHT KNEE; IMPLANT OF CEMENT SPACERS IN RIGHT KNEE (Right) Patient reports pain as mild at the moment but she has not gotten up with PT.Marland Kitchen   Patient is well but does have some indigestion  Patient with chest pain and difficulty with breathing initially after surgery but this has improved. Internal medicine was consulted. PT and Care management to assist with discharge planning. Negative for chest pain and shortness of breath Fever: no Gastrointestinal:Negative for nausea and vomiting this AM.  Objective: Vital signs in last 24 hours: Temp:  [97.1 F (36.2 C)-98.1 F (36.7 C)] 97.7 F (36.5 C) (03/19 0803) Pulse Rate:  [87-123] 87 (03/19 0803) Resp:  [12-20] 18 (03/19 0803) BP: (87-136)/(48-77) 112/56 (03/19 0803) SpO2:  [98 %-100 %] 100 % (03/19 0803) Weight:  [83.2 kg-86.2 kg] 83.2 kg (03/19 0016)  Intake/Output from previous day:  Intake/Output Summary (Last 24 hours) at 04/18/2020 0842 Last data filed at 04/18/2020 0700 Gross per 24 hour  Intake 4334.07 ml  Output 1185 ml  Net 3149.07 ml    Intake/Output this shift: No intake/output data recorded.  Labs: Recent Labs    04/17/20 2248 04/18/20 0436  HGB 9.5* 8.9*   Recent Labs    04/17/20 2248 04/18/20 0436  WBC 13.8* 8.8  RBC 4.00 3.69*  3.59*  HCT 31.3* 28.7*  PLT 370 302   Recent Labs    04/17/20 2248 04/18/20 0436  NA 138 137  K 4.9 4.9  CL 105 105  CO2 24 25  BUN 16 17  CREATININE 1.06* 1.09*  GLUCOSE 142* 117*  CALCIUM 9.7 9.2   No results for input(s): LABPT, INR in the last 72 hours.   EXAM General - Patient is Alert, Appropriate and Oriented Extremity - ABD soft  Bulky dressing intact to the right leg with hemovac in placed. Dressing extends down over the foot, toes are visualized, intact to light touch to the dorsal and volar aspect of the foot. Knee ROM brace locked in extension to the right  knee. Motor Function - intact, moving toes well on exam.  Past Medical History:  Diagnosis Date  . Anemia   . Anxiety   . Asthma    seasonal   . GERD (gastroesophageal reflux disease)   . Hypertension   . PONV (postoperative nausea and vomiting)   . Seasonal allergies   . Sleep apnea    does not uses CPAP machine anymore     Assessment/Plan: 1 Day Post-Op Procedure(s) (LRB): HARDWARE REMOVAL FROM RIGHT ANKLE AND RIGHT KNEE; IMPLANT OF CEMENT SPACERS IN RIGHT KNEE (Right) Principal Problem:   Chest pain Active Problems:   Extrinsic asthma   OSA on CPAP   Multiple lung nodules   COPD (chronic obstructive pulmonary disease) (HCC)   GERD (gastroesophageal reflux disease)   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HCC)   Anxiety   History of GI diverticular bleed  Estimated body mass index is 27.9 kg/m as calculated from the following:   Height as of this encounter: 5\' 8"  (1.727 m).   Weight as of this encounter: 83.2 kg. Advance diet Up with therapy   Labs reviewed this AM. WBC 8.8, Hg 8.9.  Continue to monitor. Cultures pending, On Ancef, rifampin and cipro at this time.  Infectious disease consulted. Keep knee brace locked in extension at this time, will check with Dr. Marry Guan on possible flexion with therapy.  Plan on keeping knee brace locked  at all times at this time. Will removed bulky dressing and hemovac tomorrow morning. Indigestion, Protonix has been ordered. Chest pain improved, internal medicine consulted.  DVT Prophylaxis - Lovenox and Foot Pumps Toe-touch weightbearing to the right leg.  Raquel Layia Walla, PA-C [facility] Orthopaedic Surgery 04/18/2020, 8:42 AM

## 2020-04-18 NOTE — Progress Notes (Signed)
ORTHOPAEDICS: PT and OT in with patient this afternoon. Patient complaining of nausea, although she refused the Reglan. I encouraged her to participate with therapy so as not to lose the gains she had made with home PT. I explained again the reasons for limiting her weightbearing on the right lower extremity (touchdown weightbearing). I explained that the preliminary results of the intraoperative cultures were "no growth" at this time. Will continue the Cipro and rifampin pending Dr. Gwenevere Ghazi recommendations.  I did order a urine C&S due to cloudy urine yesterday. Foley should be discontinued as ordered. "Contact Precautions" were on the patient's door, even though MRSA, MSSA, and SARS screen were negative yesterday. I have inquired about this with the charge nurse and she was not sure why the precautiions had been placed.  Marcio Hoque P. Holley Bouche M.D.

## 2020-04-18 NOTE — Plan of Care (Signed)
  Problem: Education: Goal: Knowledge of General Education information will improve Description: Including pain rating scale, medication(s)/side effects and non-pharmacologic comfort measures Outcome: Progressing   Problem: Clinical Measurements: Goal: Respiratory complications will improve Outcome: Progressing   Problem: Activity: Goal: Risk for activity intolerance will decrease Outcome: Not Progressing   Problem: Pain Managment: Goal: General experience of comfort will improve Outcome: Not Progressing Note: Patient profile completed. Patient has complained of pain X2 this shift. Patient has complained of nausea and indigestion. Neuro checks are within defined limits. Patient has be repositioned throughout the shift.

## 2020-04-18 NOTE — Progress Notes (Signed)
Patient foley d/c'd at this time.  Tolerated well.  Attends placed.

## 2020-04-18 NOTE — Progress Notes (Signed)
PROGRESS NOTE    Jasmine Morrow   GBT:[id number]  DOB: [birth year]  DOA: 04/17/2020 PCP: Rusty Aus, MD   Brief Narrative:  Jasmine Morrow is an 75 y.o. female with a history of asthma, GERD, OSA on CPAP, anxiety, COPD, multiple lung nodules, as well as history of ICU admission for GI bleed with hemorrhagic shock who is being seen in consultation for chest pain which she developed postoperatively.     Subjective: No further pain.    Assessment & Plan:   Principal Problem:   Chest pain - troponin I negative - 2 D ECHO reassuring w/o wall motion abnormalities or pericardial effusion - ? 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    Anxiety - cont Lexapro      Time spent in minutes: 30 DVT prophylaxis: per ortho Code Status: full code Family Communication: none- patient oriented Antimicrobials:  Anti-infectives (From admission, onward)   Start     Dose/Rate Route Frequency Ordered Stop   04/17/20 2345  ciprofloxacin (CIPRO) tablet 500 mg        500 mg Oral 2 times daily 04/17/20 2245     04/17/20 2345  rifampin (RIFADIN) capsule 300 mg        300 mg Oral 2 times daily 04/17/20 2245     04/17/20 2345  ceFAZolin (ANCEF) IVPB 2g/100 mL premix        2 g 200 mL/hr over 30 Minutes Intravenous Every 6 hours 04/17/20 2245 04/18/20 0555   04/17/20 1842  gentamicin (GARAMYCIN) injection  Status:  Discontinued          As needed 04/17/20 1843 04/17/20 1918   04/17/20 1730  vancomycin (VANCOCIN) powder  Status:  Discontinued          As needed 04/17/20 1730  04/17/20 1918   04/17/20 1728  Tobramycin Sulfate POWD  Status:  Discontinued          As needed 04/17/20 1730 04/17/20 1918   04/17/20 0930  ceFAZolin (ANCEF) 2-4 GM/100ML-% IVPB       Note to Pharmacy: Ronnell Freshwater   : cabinet override      04/17/20 0930 04/17/20 1204   04/17/20 0600  ceFAZolin (ANCEF) IVPB 2g/100 mL premix        2 g 200 mL/hr over 30 Minutes Intravenous On call to O.R. 04/17/20 0038 04/17/20 1607       Objective: Vitals:   04/18/20 0019 04/18/20 0351 04/18/20 0803 04/18/20 1132  BP: 92/60 97/65 (!) 112/56 (!) 97/51  Pulse: 91 (!) 106 87 91  Resp:  18 18 18   Temp:  98.1 F (36.7 C) 97.7 F (36.5 C) 98 F (36.7 C)  TempSrc:   Oral Oral  SpO2:  100% 100% 95%  Weight:      Height:        Intake/Output Summary (Last 24 hours) at 04/18/2020 1451 Last data filed at 04/18/2020 0700 Gross per 24 hour  Intake 3234.07 ml  Output 980 ml  Net 2254.07 ml   Filed Weights   04/17/20 0903 04/18/20 0016  Weight: 86.2 kg 83.2 kg    Examination: General exam: Appears comfortable  HEENT: PERRLA, oral mucosa moist, no sclera icterus or thrush Respiratory system: Clear to auscultation. Respiratory effort normal. Cardiovascular system: S1 & S2 heard, RRR.   Gastrointestinal system: Abdomen soft, non-tender, nondistended. Normal bowel sounds. Central nervous system: Alert and oriented. No focal neurological deficits. Extremities: No cyanosis, clubbing or edema Skin: No rashes or ulcers Psychiatry:  Mood & affect appropriate.     Data Reviewed: I have personally reviewed following labs and imaging studies  CBC: Recent Labs  Lab 04/17/20 2248 04/18/20 0436  WBC 13.8* 8.8  NEUTROABS 11.8*  --   HGB 9.5* 8.9*  HCT 31.3* 28.7*  MCV 78.3* 77.8*  PLT 370 628   Basic Metabolic Panel: Recent Labs  Lab 04/17/20 2248 04/18/20 0436  NA 138 137  K 4.9 4.9  CL 105 105  CO2 24 25  GLUCOSE 142* 117*  BUN 16 17  CREATININE 1.06* 1.09*  CALCIUM 9.7 9.2    GFR: Estimated Creatinine Clearance: 52 mL/min (A) (by C-G formula based on SCr of 1.09 mg/dL (H)). Liver Function Tests: Recent Labs  Lab 04/17/20 2248  AST 37  ALT 26  ALKPHOS 91  BILITOT 0.6  PROT 6.2*  ALBUMIN 2.9*   No results for input(s): LIPASE, AMYLASE in the last 168 hours. No results for input(s): AMMONIA in the last 168 hours. Coagulation Profile: No results for input(s): INR, PROTIME in the last 168 hours. Cardiac Enzymes: No results for input(s): CKTOTAL, CKMB, CKMBINDEX, TROPONINI in the last 168 hours. BNP (last 3 results) No results for input(s): PROBNP in the last 8760 hours. HbA1C: No results for input(s): HGBA1C in the last 72 hours. CBG: No results for input(s): GLUCAP in the last 168 hours. Lipid Profile: Recent Labs    04/17/20 2248  CHOL 175  HDL 41  LDLCALC 107*  TRIG 137  CHOLHDL 4.3   Thyroid Function Tests: Recent Labs    04/17/20 2248  TSH 3.117   Anemia Panel: Recent Labs    04/18/20 0436  FOLATE 6.0  FERRITIN 106  TIBC 230*  IRON 23*  RETICCTPCT 2.0   Urine analysis:    Component Value Date/Time   COLORURINE AMBER (A) 04/18/2020 1235   APPEARANCEUR CLOUDY (A) 04/18/2020 1235   LABSPEC 1.023 04/18/2020 1235   PHURINE 5.0 04/18/2020 1235   GLUCOSEU NEGATIVE 04/18/2020 1235   HGBUR MODERATE (A) 04/18/2020 1235   BILIRUBINUR NEGATIVE 04/18/2020 1235   KETONESUR NEGATIVE 04/18/2020 1235   PROTEINUR 30 (A) 04/18/2020 1235   NITRITE NEGATIVE 04/18/2020 1235   LEUKOCYTESUR LARGE (A) 04/18/2020 1235   Sepsis Labs: @LABRCNTIP (procalcitonin:4,lacticidven:4) ) Recent Results (from the past 240 hour(s))  SARS Coronavirus 2 by RT PCR (hospital order, performed in [place] hospital lab) Nasopharyngeal Nasopharyngeal Swab     Status: None   Collection Time: 04/17/20  9:01 AM   Specimen: Nasopharyngeal Swab  Result Value Ref Range Status   SARS Coronavirus 2 NEGATIVE NEGATIVE Final    Comment: (NOTE) SARS-CoV-2 target  nucleic acids are NOT DETECTED.  The SARS-CoV-2 RNA is generally detectable in upper and lower respiratory specimens during the acute phase of infection. The lowest concentration of SARS-CoV-2 viral copies this assay can detect is 250 copies / mL. A negative result does not preclude SARS-CoV-2 infection and should not be used as the sole basis for treatment or other  patient management decisions.  A negative result may occur with improper specimen collection / handling, submission of specimen other than nasopharyngeal swab, presence of viral mutation(s) within the areas targeted by this assay, and inadequate number of viral copies (<250 copies / mL). A negative result must be combined with clinical observations, patient history, and epidemiological information.  Fact Sheet for Patients:   StrictlyIdeas.no  Fact Sheet for Healthcare Providers: BankingDealers.co.za  This test is not yet approved or  cleared by the [place] FDA and has been authorized for detection and/or diagnosis of SARS-CoV-2 by FDA under an Emergency Use Authorization (EUA).  This EUA will remain in effect (meaning this test can be used) for the duration of the COVID-19 declaration under Section 564(b)(1) of the Act, 21 U.S.C. section 360bbb-3(b)(1), unless the authorization is terminated or revoked sooner.  Performed at [facility], [address], [place], [place] [address]   Surgical pcr screen     Status: None   Collection Time: 04/17/20  9:38 AM   Specimen: Nasal Mucosa; Nasal Swab  Result Value Ref Range Status   MRSA, PCR NEGATIVE NEGATIVE Final   Staphylococcus aureus NEGATIVE NEGATIVE Final    Comment: (NOTE) The Xpert SA Assay (FDA approved for NASAL specimens in patients 58 years of age and older), is one component of a comprehensive surveillance program. It is not intended to diagnose infection nor to guide or monitor  treatment. Performed at [facility], [place], [place] [address]   Aerobic/Anaerobic Culture w Gram Stain (surgical/deep wound)     Status: None (Preliminary result)   Collection Time: 04/17/20 12:23 PM   Specimen: Ankle  Result Value Ref Range Status   Specimen Description ANKLE  Final   Special Requests POSTERIOR LATERAL RIGHT ANKLE PLATE  Final   Gram Stain   Final    FEW WBC PRESENT, PREDOMINANTLY PMN NO ORGANISMS SEEN    Culture   Final    NO GROWTH < 24 HOURS Performed at [facility], [place] [address], [place], [place] [address]    Report Status PENDING  Incomplete  Gram stain     Status: None   Collection Time: 04/17/20  1:00 PM   Specimen: PATH Other; Tissue  Result Value Ref Range Status   Specimen Description ANKLE POSTERIOR LATERAL ANKLE RIGHT PLATE  Final   Special Requests NONE  Final   Gram Stain   Final    NO ORGANISMS SEEN FEW WBC SEEN Performed at [facility], [address] [place], [place] [address]    Report Status 04/17/2020 FINAL  Final  Gram stain     Status: None   Collection Time: 04/17/20  1:16 PM   Specimen: PATH Other; Tissue  Result Value Ref Range Status   Specimen Description ANKLE  Final   Special Requests NONE  Final   Gram Stain   Final    NO ORGANISMS SEEN FEW WBC SEEN Performed at [facility], [address], [place], [place] [address]    Report Status 04/17/2020 FINAL  Final  Aerobic/Anaerobic Culture w Gram Stain (surgical/deep wound)     Status: None (Preliminary result)   Collection Time: 04/17/20  1:16 PM   Specimen: PATH Other; Tissue  Result Value Ref Range Status   Specimen Description   Final    ANKLE Performed at [facility], [address], [place],  [address]    Special Requests   Final    NONE  Performed at [facility], [address] [place], [place] [address]    Gram Stain   Final    RARE WBC PRESENT, PREDOMINANTLY PMN NO  ORGANISMS SEEN    Culture   Final    NO GROWTH < 24 HOURS Performed at [place] [address], [place], [place] [address]    Report Status PENDING  Incomplete  Gram stain     Status: None   Collection Time: 04/17/20  1:25 PM   Specimen: PATH Other; Tissue  Result Value Ref Range Status   Specimen Description WOUND RIGHT DISTAL FIBULA  Final   Special Requests NONE  Final   Gram Stain   Final    NO ORGANISMS SEEN FEW WBC SEEN Performed at [facility], [address], [place], [place] [address]    Report Status 04/17/2020 FINAL  Final  Aerobic/Anaerobic Culture w Gram Stain (surgical/deep wound)     Status: None (Preliminary result)   Collection Time: 04/17/20  1:25 PM   Specimen: PATH Other; Tissue  Result Value Ref Range Status   Specimen Description   Final    WOUND RIGHT DISTAL FIBULA Performed at [facility], [address] [place], [place] [address]    Special Requests   Final    NONE Performed at [facility], [address] [place], [place] [address]    Gram Stain   Final    FEW WBC PRESENT, PREDOMINANTLY PMN NO ORGANISMS SEEN    Culture   Final    NO GROWTH < 24 HOURS Performed at [place] [address], [place], [place] [address]    Report Status PENDING  Incomplete  Gram stain     Status: None   Collection Time: 04/17/20  2:24 PM   Specimen: PATH Other; Tissue  Result Value Ref Range Status   Specimen Description WOUND  Final   Special Requests MEDIAL RIGHT SCREW  Final   Gram Stain   Final    NO ORGANISMS SEEN FEW WBC SEEN RESULT CALLED TO, READ BACK BY AND VERIFIED WITH: ADRIENNE ALLRED AT 7939 ON 04/17/20 BY SS Performed at [facility], [address], [place], [place] [address]    Report Status 04/17/2020 FINAL  Final  Aerobic/Anaerobic Culture w Gram Stain (surgical/deep wound)     Status: None (Preliminary result)   Collection Time: 04/17/20  2:24 PM   Specimen: PATH Other; Tissue   Result Value Ref Range Status   Specimen Description   Final    WOUND Performed at [facility], [address] [place], [place] [address]    Special Requests   Final    MEDIAL SCREW RIGHT Performed at [facility], [address] [place], [place] [address]    Gram Stain   Final    RARE WBC PRESENT,BOTH PMN AND MONONUCLEAR NO ORGANISMS SEEN Performed at [facility], [place] [address], [place],  [address]    Culture PENDING  Incomplete   Report Status PENDING  Incomplete  Gram stain     Status: None   Collection Time: 04/17/20  2:29 PM   Specimen: PATH Other; Tissue  Result Value Ref Range Status   Specimen Description WOUND  Final   Special Requests MEDIAL RIGHT ANKLE  Final   Gram Stain   Final    NO ORGANISMS SEEN RARE WBC SEEN RESULT CALLED TO, READ BACK BY AND VERIFIED WITH: ADRIENNE ALLRED AT 1526 ON  04/17/20 BY SS Performed at [facility], [address] [place], [place] [address]    Report Status 04/17/2020 FINAL  Final  Aerobic/Anaerobic Culture w Gram Stain (surgical/deep wound)     Status: None (Preliminary result)   Collection Time: 04/17/20  2:29 PM   Specimen: PATH Other; Tissue  Result Value Ref Range Status   Specimen Description   Final    WOUND Performed at [facility], [address], [place], [place] [address]    Special Requests MEDIAL RIGHT ANKLE  Final   Gram Stain   Final    NO WBC SEEN NO ORGANISMS SEEN Performed at [facility], [place] [address] [place], [place] [address]    Culture PENDING  Incomplete   Report Status PENDING  Incomplete  Gram stain     Status: None   Collection Time: 04/17/20  3:51 PM   Specimen: PATH Other; Tissue  Result Value Ref Range Status   Specimen Description TISSUE  Final   Special Requests RIGHT KNEE ASPIRATION  Final   Gram Stain   Final    NO ORGANISMS SEEN MANY WBC SEEN Performed at [facility], [address], [place], [place]  [address]    Report Status 04/17/2020 FINAL  Final  Aerobic/Anaerobic Culture w Gram Stain (surgical/deep wound)     Status: None (Preliminary result)   Collection Time: 04/17/20  3:51 PM   Specimen: PATH Other; Tissue  Result Value Ref Range Status   Specimen Description   Final    TISSUE Performed at [facility], [address] [place], [place] [address]    Special Requests   Final     RIGHT KNEE ASPIRATION Performed at [facility], [address] [place], [place] [address]    Gram Stain   Final    MODERATE WBC PRESENT,BOTH PMN AND MONONUCLEAR NO ORGANISMS SEEN Performed at [facility], [place] [address], [place], [place] [address]    Culture PENDING  Incomplete   Report Status PENDING  Incomplete  Gram stain     Status: None   Collection Time: 04/17/20  3:58 PM   Specimen: PATH Other; Tissue  Result Value Ref Range Status   Specimen Description KNEE KNEE  Final   Special Requests NONE  Final   Gram Stain   Final    NO ORGANISMS SEEN FEW WBC SEEN Performed at [facility], [address] [place], [place] [address]    Report Status 04/17/2020 FINAL  Final  Gram stain     Status: None   Collection Time: 04/17/20  3:58 PM   Specimen: PATH Other; Tissue  Result Value Ref Range Status   Specimen Description WOUND  Final   Special Requests NONE  Final   Gram Stain   Final    NO ORGANISMS SEEN FEW WBC SEEN Performed at [facility], [address], [place], [place] [address]    Report Status 04/17/2020 FINAL  Final  Aerobic/Anaerobic Culture w Gram Stain (surgical/deep wound)     Status: None (Preliminary result)   Collection Time: 04/17/20  3:58 PM   Specimen: PATH Other; Tissue  Result Value Ref Range Status   Specimen Description   Final    WOUND Performed at [facility], [address] [place], [place] [address]    Special Requests   Final    ANTERIOR KNEE Performed at [facility], [address] [place], [place] [address]  Gram Stain   Final    RARE WBC PRESENT, PREDOMINANTLY MONONUCLEAR NO ORGANISMS SEEN Performed at [place] [address] [place], [place] [address]    Culture PENDING  Incomplete   Report Status PENDING  Incomplete  Aerobic/Anaerobic Culture w Gram Stain (surgical/deep wound)     Status: None (Preliminary result)   Collection Time: 04/17/20  3:58 PM   Specimen: PATH Other; Tissue  Result Value Ref Range Status   Specimen Description   Final    WOUND Performed at [facility], [address], [place], [place] [address]    Special Requests POSTERIOR RECESS RIGHT KNEE  Final   Gram Stain   Final    RARE WBC PRESENT, PREDOMINANTLY PMN NO ORGANISMS SEEN Performed at [facility], [place] [address], [place], [place] [address]    Culture PENDING  Incomplete   Report Status PENDING  Incomplete  Gram stain     Status: None   Collection Time: 04/17/20  4:24 PM   Specimen: PATH Other; Tissue  Result Value Ref Range Status   Specimen Description TISSUE  Final   Special Requests RIGHT FEMUR UNDER IMPLANT  Final   Gram Stain   Final    NO ORGANISMS SEEN FEW WBC SEEN Performed at [facility], [address] [place], [place] [address]    Report Status 04/17/2020 FINAL  Final  Aerobic/Anaerobic Culture w Gram Stain (surgical/deep wound)     Status: None (Preliminary result)   Collection Time: 04/17/20  4:24 PM   Specimen: PATH Other; Tissue  Result Value Ref Range Status   Specimen Description   Final    TISSUE Performed at [facility], [address] [place], [place] [address]    Special Requests   Final     RIGHT FEMUR UNDER IMPLANT Performed at [facility], [address] [place], [place] [address]    Gram Stain   Final    RARE WBC PRESENT,BOTH PMN AND MONONUCLEAR NO ORGANISMS SEEN Performed at [facility], [place] [address], [place], [place] [address]    Culture PENDING   Incomplete   Report Status PENDING  Incomplete  Gram stain     Status: None   Collection Time: 04/17/20  4:56 PM   Specimen: PATH Other; Tissue  Result Value Ref Range Status   Specimen Description TISSUE  Final   Special Requests TIBIAL CANAL  Final   Gram Stain   Final    NO ORGANISMS SEEN MANY WBC SEEN Performed at [facility], [address], [place], [place] [address]    Report Status 04/17/2020 FINAL  Final  Aerobic/Anaerobic Culture w Gram Stain (surgical/deep wound)     Status: None (Preliminary result)   Collection Time: 04/17/20  4:56 PM   Specimen: PATH Other; Tissue  Result Value Ref Range Status   Specimen Description   Final    TISSUE Performed at [facility], [address] [place], [place] [address]    Special Requests   Final    TIBIAL CANAL Performed at [facility], [address] [place], [place] [address]    Gram Stain   Final    ABUNDANT WBC PRESENT,BOTH PMN AND MONONUCLEAR NO ORGANISMS SEEN Performed at [facility], [place] [address] [place], [place] [address]    Culture PENDING  Incomplete   Report Status PENDING  Incomplete         Radiology Studies: DG Ankle  2 Views Right  Result Date: 04/17/2020 CLINICAL DATA:  Hardware removal EXAM: RIGHT ANKLE - 2 VIEW COMPARISON:  January 22, 2020 FINDINGS: Surgical hardware is been removed from the tibia and fibula. An of attic beads are identified. Skin staples are noted. IMPRESSION: Surgical hardware has been removed as above. Osteopenia. Skin staples. Antibiotic beads identified. Electronically Signed   By: Dorise Bullion III M.D   On: 04/17/2020 20:18   DG Chest Port 1 View  Result Date: 04/17/2020 CLINICAL DATA:  Chest pain EXAM: PORTABLE CHEST 1 VIEW COMPARISON:  02/20/2019 FINDINGS: Heart and mediastinal contours are within normal limits. No focal opacities or effusions. No acute bony abnormality. IMPRESSION: No active disease. Electronically Signed   By:  Rolm Baptise M.D.   On: 04/17/2020 22:31   DG Knee Right Port  Result Date: 04/17/2020 CLINICAL DATA:  Hardware removal. EXAM: PORTABLE RIGHT KNEE - 1-2 VIEW COMPARISON:  Preoperative radiograph 01/22/2020 FINDINGS: Removal of right knee arthroplasty with placement of spacer. No periprosthetic lucency or fracture. There is been patellar resurfacing. Surgical drain is in place. Anterior skin staples with postsurgical edema soft tissues. IMPRESSION: Removal of right knee arthroplasty with placement of spacer. No immediate postoperative complication. Electronically Signed   By: Keith Rake M.D.   On: 04/17/2020 20:23   DG MINI C-ARM IMAGE ONLY  Result Date: 04/17/2020 There is no interpretation for this exam.  This order is for images obtained during a surgical procedure.  Please See "Surgeries" Tab for more information regarding the procedure.   ECHOCARDIOGRAM COMPLETE  Result Date: 04/18/2020    ECHOCARDIOGRAM REPORT   Patient Name:   TALON WITTING Date of Exam: 04/18/2020 Medical Rec #:  [id number]       Height:       68.0 in Accession #:    [id number]      Weight:       183.5 lb Date of Birth:  [birth year]       BSA:          1.970 m Patient Age:    68 years        BP:           97/65 mmHg Patient Gender: F               HR:           90 bpm. Exam Location:  [facility] Procedure: 2D Echo Indications:     Chest Pain R07.9  History:         Patient has prior history of Echocardiogram examinations, most                  recent 02/26/2019.  Sonographer:     Arville Go RDCS Referring Phys:  [id number] Athena Masse Diagnosing Phys: Kate Sable MD IMPRESSIONS  1. Left ventricular ejection fraction, by estimation, is 55 to 60%. The left ventricle has normal function. The left ventricle has no regional wall motion abnormalities. Left ventricular diastolic parameters are consistent with Grade I diastolic dysfunction (impaired relaxation).  2. Right ventricular systolic function is normal. The right  ventricular size is normal.  3. The mitral valve is normal in structure. No evidence of mitral valve regurgitation.  4. The aortic valve was not well visualized. Aortic valve regurgitation is not visualized. Mild aortic valve sclerosis is present, with no evidence of aortic valve stenosis.  5. The inferior vena cava is normal in size with greater than 50% respiratory variability, suggesting right atrial pressure  of 3 mmHg. FINDINGS  Left Ventricle: Left ventricular ejection fraction, by estimation, is 55 to 60%. The left ventricle has normal function. The left ventricle has no regional wall motion abnormalities. The left ventricular internal cavity size was normal in size. There is  no left ventricular hypertrophy. Left ventricular diastolic parameters are consistent with Grade I diastolic dysfunction (impaired relaxation). Right Ventricle: The right ventricular size is normal. No increase in right ventricular wall thickness. Right ventricular systolic function is normal. Left Atrium: Left atrial size was normal in size. Right Atrium: Right atrial size was normal in size. Pericardium: There is no evidence of pericardial effusion. Mitral Valve: The mitral valve is normal in structure. No evidence of mitral valve regurgitation. Tricuspid Valve: The tricuspid valve is normal in structure. Tricuspid valve regurgitation is not demonstrated. Aortic Valve: The aortic valve was not well visualized. Aortic valve regurgitation is not visualized. Mild aortic valve sclerosis is present, with no evidence of aortic valve stenosis. Aortic valve peak gradient measures 6.6 mmHg. Pulmonic Valve: The pulmonic valve was not well visualized. Pulmonic valve regurgitation is not visualized. Aorta: The aortic root is normal in size and structure. Venous: The inferior vena cava is normal in size with greater than 50% respiratory variability, suggesting right atrial pressure of 3 mmHg. IAS/Shunts: No atrial level shunt detected by color flow  Doppler.  LEFT VENTRICLE PLAX 2D LVIDd:         4.46 cm  Diastology LVIDs:         3.19 cm  LV e' medial:    4.68 cm/s LV PW:         0.94 cm  LV E/e' medial:  17.2 LV IVS:        0.91 cm  LV e' lateral:   5.87 cm/s LVOT diam:     1.80 cm  LV E/e' lateral: 13.7 LV SV:         53 LV SV Index:   27 LVOT Area:     2.54 cm  RIGHT VENTRICLE RV Basal diam:  3.19 cm RV S prime:     13.50 cm/s TAPSE (M-mode): 2.0 cm LEFT ATRIUM             Index       RIGHT ATRIUM          Index LA diam:        2.80 cm 1.42 cm/m  RA Area:     6.25 cm LA Vol (A2C):   25.6 ml 12.99 ml/m RA Volume:   9.82 ml  4.98 ml/m LA Vol (A4C):   20.5 ml 10.40 ml/m LA Biplane Vol: 23.9 ml 12.13 ml/m  AORTIC VALVE                PULMONIC VALVE AV Area (Vmax): 2.33 cm    PV Vmax:       0.94 m/s AV Vmax:        128.00 cm/s PV Peak grad:  3.5 mmHg AV Peak Grad:   6.6 mmHg LVOT Vmax:      117.00 cm/s LVOT Vmean:     81.600 cm/s LVOT VTI:       0.208 m  AORTA Ao Root diam: 2.90 cm MITRAL VALVE                TRICUSPID VALVE MV Area (PHT): 4.10 cm     TV Peak grad:   21.0 mmHg MV Decel Time: 185 msec     TV Vmax:  2.29 m/s MV E velocity: 80.60 cm/s MV A velocity: 109.00 cm/s  SHUNTS MV E/A ratio:  0.74         Systemic VTI:  0.21 m                             Systemic Diam: 1.80 cm Kate Sable MD Electronically signed by Kate Sable MD Signature Date/Time: 04/18/2020/2:11:36 PM    Final       Scheduled Meds: . carvedilol  3.125 mg Oral BID  . Chlorhexidine Gluconate Cloth  6 each Topical Daily  . ciprofloxacin  500 mg Oral BID  . enoxaparin (LOVENOX) injection  30 mg Subcutaneous Q12H  . escitalopram  10 mg Oral Daily  . ferrous sulfate  325 mg Oral BID WC  . loratadine  10 mg Oral Daily  . magnesium hydroxide  30 mL Oral Daily  . metoCLOPramide  10 mg Oral TID AC & HS  . multivitamin-lutein  1 capsule Oral BID  . pantoprazole  40 mg Oral BID  . polyethylene glycol  17 g Oral Daily  . potassium chloride  10 mEq Oral  Q1200  . rifampin  300 mg Oral BID  . senna-docusate  1 tablet Oral BID   Continuous Infusions: . sodium chloride 100 mL/hr at 04/18/20 1206  . acetaminophen 1,000 mg (04/18/20 1224)     LOS: 1 day      Debbe Odea, MD [facility] Pager: [email or web address] 04/18/2020, 2:51 PM

## 2020-04-18 NOTE — Evaluation (Signed)
Occupational Therapy Evaluation Patient Details Name: Jasmine Morrow MRN: [id number] DOB: 12/30/1945 Today's Date: 04/18/2020    History of Present Illness Pt is a 75 y/o F admitted on 04/16/20 via EMS for re-evaluation of R knee & R ankle. Pt is 2.5 months s/p R knee arthrotomy, I&D, polyethylene exchange & placement of antibiotic eluding beads. Cultures were (+) for methicillin sensitive staph aureus & pt completed course of ancef & has continued with other medications. Pt continues to have serous drainage from medial lesion of R ankle which grew pseudomonas putida. Pt underwent hardware removal from R ankle with I&D & placement of antibiotic beads & R knee arthrotomy, extensive I&D, removal of R TKA implants & placement of antibiotic cement spacer on 04/17/20 by Dr. Marry Guan. Pt is now TDWB RLE in R knee immobilizer. Post-operatively pt developed chest pain & hospitalists were consulted.  PMH: asthma, GERD, OSA on CPAP, anxiety, COPD, multiple lung nodules, hx of ICU admission for GI bleed with hemorrhagic shock.   Clinical Impression   Pt seen for OT evaluation thi sdate in setting of acute hospitalization s/p hardware removal to R LE and replacement with cement abx spacer to knee and abx beads to ankle. Pt with some anxiety about mobilizing with therapy this date. PT/OT co-evaluation completed in an effort to maximize pt benefit from therapy d/t limited tolerance. Pt also complains of nausea and RN/MD aware. Pt presents this date with decreased strength and fxl activity tolerance as well as expected post op pain to R LE, impacting her ability to safely perform ADLs/ADL mobility. Pt reports at baseline, she had started being able to walk [facility] distances with AD with her home health PT and states that her family members were largely assisting with ADLs. Pt endorses that she should be able to walk to restroom or at least pivot to [facility], but she gradually was moving less and less as pain in her R LE progressed.  Pt requires skilled OT to improve pt tolerance for seated ADLs and ADL transfers as well as general ability to meaningfully contribute to ADLs to reduce caregiver burden. OT will continue to follow acutely. Pt could benefit from rehab follow up, but pt indicates that she would like to resume her home health therapy services. I would request that she start [place] as well in an effort to increase her tolerance and participation in self care ADLs.     Follow Up Recommendations  Home health OT;Supervision/Assistance - 24 hour    Equipment Recommendations  None recommended by OT (pt has all required equipment)    Recommendations for Other Services       Precautions / Restrictions Precautions Precautions: Fall Required Braces or Orthoses: Other Brace Other Brace: R hinged knee brace locked in extension Restrictions Weight Bearing Restrictions: Yes RLE Weight Bearing: Touchdown weight bearing      Mobility Bed Mobility Overal bed mobility: Needs Assistance Bed Mobility: Supine to Sit;Sit to Supine     Supine to sit: Min assist;+2 for physical assistance;HOB elevated Sit to supine: Min assist;+2 for physical assistance;HOB elevated   General bed mobility comments: support under RLE, assistance to upright trunk; pt is able to scoot to [facility] once close enough to hold to bed rails    Transfers                 General transfer comment: deferred    Balance Overall balance assessment: Mild deficits observed, not formally tested;Needs assistance Sitting-balance support: Feet supported;Bilateral upper extremity supported  Sitting balance-Leahy Scale: Fair Sitting balance - Comments: supervision sitting EOB x 3 minutes                   ADL either performed or assessed with clinical judgement   ADL                           General ADL Comments: requires SETUP for seated UB, but limited tolerance for sitting, mostly performs UB ADLs in bed, requires MAX A for LB  dressing/bathing bed level     Vision Baseline Vision/History: Wears glasses Wears Glasses: At all times Patient Visual Report: No change from baseline       Perception     Praxis      Pertinent Vitals/Pain Pain Assessment: Faces Faces Pain Scale: Hurts a little bit Pain Location: RLE Pain Descriptors / Indicators: Guarding Pain Intervention(s): Limited activity within patient's tolerance;Monitored during session     Hand Dominance Left   Extremity/Trunk Assessment Upper Extremity Assessment Upper Extremity Assessment: Generalized weakness   Lower Extremity Assessment Lower Extremity Assessment: Generalized weakness;RLE deficits/detail RLE Deficits / Details: hinged KI RLE: Unable to fully assess due to immobilization       Communication Communication Communication: No difficulties   Cognition Arousal/Alertness: Awake/alert Behavior During Therapy: Anxious Overall Cognitive Status: Within Functional Limits for tasks assessed                                 General Comments: extremely anxious, Dr. Marry Guan present at beginning of session to provide education & encouragement re: need for participation in PT. PT & OT provide max encouragement/cuing re: participation & redirection to task at hand.   General Comments       Exercises Other Exercises Other Exercises: OT educates pt re: importance of OOB Activity inclusing circulation, prevention of muscle atrohpy, prevention of bed sores. Pt with some anxiety related to mobilizing, but is able to be calmed down and is agreeable to education provided.   Shoulder Instructions      Home Living Family/patient expects to be discharged to:: Private residence Living Arrangements: Spouse/significant other Available Help at Discharge: Family;Available 24 hours/day Type of Home: House Home Access: Ramped entrance     Home Layout: One level     Bathroom Shower/Tub: Walk-in shower;Door   Bathroom Toilet:  Handicapped height     Home Equipment: Environmental consultant - 4 wheels;Bedside commode;Wheelchair - manual;Hospital bed          Prior Functioning/Environment Level of Independence: Needs assistance  Gait / Transfers Assistance Needed: Was getting HHPT prior to admission and working on transfer and mobility.  Issues since last Jan after ankle fx and now knee revision. ADL's / Homemaking Assistance Needed: PTA she required some assist with lower body dressing and self care tasks from husband.  Husband does the housework, cooking and cleaning.   Comments: After surgery in December pt was working with HHPT & walking ~80 ft with RW & supervision + w/c follow. Using briefs 2/2 "laziness" (pt self reports) & requires dependent assist for peri hygiene. Husband & son assist pt, also has a friend that sits with her during the day. Pt able to bathe some of her upper body but requires assistance for LE & back.        OT Problem List: Decreased strength;Decreased range of motion;Decreased activity tolerance;Impaired balance (sitting and/or standing);Pain;Increased edema  OT Treatment/Interventions: Self-care/ADL training;DME and/or AE instruction;Therapeutic activities;Balance training;Therapeutic exercise;Patient/family education    OT Goals(Current goals can be found in the care plan section) Acute Rehab OT Goals Patient Stated Goal: to walk and to be able to use the bathroom at will OT Goal Formulation: With patient Time For Goal Achievement: 05/02/20 Potential to Achieve Goals: Good ADL Goals Pt Will Perform Grooming: with supervision;sitting (unsupported x10 mins to increase seated fxl activity tolerance) Pt Will Transfer to Toilet: with min assist;with mod assist;stand pivot transfer;bedside commode Pt Will Perform Toileting - Clothing Manipulation and hygiene: with min assist;with mod assist;sit to/from stand Pt/caregiver will Perform Home Exercise Program: Increased strength;Both right and left  upper extremity;With Supervision  OT Frequency: Min 1X/week   Barriers to D/C:            Co-evaluation PT/OT/SLP Co-Evaluation/Treatment: Yes Reason for Co-Treatment: Complexity of the patient's impairments (multi-system involvement);For patient/therapist safety;To address functional/ADL transfers PT goals addressed during session: Mobility/safety with mobility OT goals addressed during session: ADL's and self-care      AM-PAC OT "6 Clicks" Daily Activity     Outcome Measure Help from another person eating meals?: None Help from another person taking care of personal grooming?: A Little Help from another person toileting, which includes using toliet, bedpan, or urinal?: A Lot Help from another person bathing (including washing, rinsing, drying)?: A Lot Help from another person to put on and taking off regular upper body clothing?: A Little Help from another person to put on and taking off regular lower body clothing?: A Lot 6 Click Score: 16   End of Session Nurse Communication: Mobility status  Activity Tolerance: Patient tolerated treatment well Patient left: in bed;with call bell/phone within reach;with bed alarm set  OT Visit Diagnosis: Unsteadiness on feet (R26.81);Muscle weakness (generalized) (M62.81)                Time: 0998-3382 OT Time Calculation (min): 43 min Charges:  OT General Charges $OT Visit: 1 Visit OT Evaluation $OT Eval Moderate Complexity: 1 Mod OT Treatments $Self Care/Home Management : 8-22 mins  Gerrianne Scale, MS, OTR/L ascom [phone] 04/18/20, 4:22 PM

## 2020-04-18 NOTE — Evaluation (Addendum)
Physical Therapy Evaluation Patient Details Name: Jasmine Morrow MRN: [id number] DOB: Mar 20, 1945 Today's Date: 04/18/2020   History of Present Illness  Pt is a 75 y/o F admitted on 04/16/20 via EMS for re-evaluation of R knee & R ankle. Pt is 2.5 months s/p R knee arthrotomy, I&D, polyethylene exchange & placement of antibiotic eluding beads. Cultures were (+) for methicillin sensitive staph aureus & pt completed course of ancef & has continued with other medications. Pt continues to have serous drainage from medial lesion of R ankle which grew pseudomonas putida. Pt underwent hardware removal from R ankle with I&D & placement of antibiotic beads & R knee arthrotomy, extensive I&D, removal of R TKA implants & placement of antibiotic cement spacer on 04/17/20 by Dr. Marry Guan. Pt is now TDWB RLE in R knee immobilizer. Post-operatively pt developed chest pain & hospitalists were consulted.  PMH: asthma, GERD, OSA on CPAP, anxiety, COPD, multiple lung nodules, hx of ICU admission for GI bleed with hemorrhagic shock.  Clinical Impression  Pt seen for co-evaluation with OT. MD present in room at beginning of session with MD & therapists providing education & encouragement re: participation in therapy. PT & OT provide education re: RLE precautions & encouragement re: anxiety surrounding situation. Pt is able to participate in supine<>sit, only requiring min assist +2 & tolerates sitting EOB with PT supporting RLE the entire time 2/2 pt's inability to place RLE flat on floor 2/2 ROM restrictions. Pt is able to scoot to [facility] while sitting EOB with cuing for technique. Pt is very eager to return home, reporting her husband, son, friend, and HHPT are able to provide assistance needed; educated pt on need for non emergent EMS transport home. Will continue to follow pt acutely to progress transfers & for RLE strengthening.    Follow Up Recommendations Home health PT;Supervision for mobility/OOB    Equipment  Recommendations  Other (comment) (potential need for hoyer lift if pt unable to complete squat pivot transfers to BSC/w/c.); non emergent EMS transport home   Recommendations for Other Services       Precautions / Restrictions Precautions Precautions: Fall Required Braces or Orthoses: Other Brace Other Brace: R hinged knee brace locked in extension Restrictions Weight Bearing Restrictions: Yes RLE Weight Bearing: Touchdown weight bearing      Mobility  Bed Mobility Overal bed mobility: Needs Assistance Bed Mobility: Supine to Sit;Sit to Supine     Supine to sit: Min assist;+2 for physical assistance;HOB elevated Sit to supine: Min assist;+2 for physical assistance;HOB elevated   General bed mobility comments: support under RLE, assistance to upright trunk; pt is able to scoot to [facility] once close enough to hold to bed rails    Transfers                    Ambulation/Gait                Stairs            Wheelchair Mobility    Modified Rankin (Stroke Patients Only)       Balance Overall balance assessment: Mild deficits observed, not formally tested;Needs assistance Sitting-balance support: Feet supported;Bilateral upper extremity supported Sitting balance-Leahy Scale: Fair Sitting balance - Comments: supervision sitting EOB x 3 minutes                                     Pertinent Vitals/Pain Pain  Assessment: Faces Faces Pain Scale: Hurts a little bit Pain Location: RLE Pain Descriptors / Indicators: Guarding Pain Intervention(s): Monitored during session;Repositioned;Limited activity within patient's tolerance    Home Living Family/patient expects to be discharged to:: Private residence Living Arrangements: Spouse/significant other Available Help at Discharge: Family;Available 24 hours/day Type of Home: House Home Access: Ramped entrance     Home Layout: One level Home Equipment: Walker - 4 wheels;Bedside  commode;Wheelchair - manual;Hospital bed      Prior Function Level of Independence: Needs assistance         Comments: After surgery in December pt was working with HHPT & walking ~80 ft with RW & supervision + w/c follow. Using briefs 2/2 "laziness" (pt self reports) & requires dependent assist for peri hygiene. Husband & son assist pt, also has a friend that sits with her during the day. Pt able to bathe some of her upper body but requires assistance for LE & back.     Hand Dominance   Dominant Hand: Left    Extremity/Trunk Assessment   Upper Extremity Assessment Upper Extremity Assessment: Generalized weakness    Lower Extremity Assessment Lower Extremity Assessment: Generalized weakness (RLE in R hinged knee brace locked in extension, not tested)       Communication   Communication: No difficulties  Cognition Arousal/Alertness: Awake/alert Behavior During Therapy: Anxious Overall Cognitive Status: Within Functional Limits for tasks assessed                                 General Comments: extremely anxious, Dr. Marry Guan present at beginning of session to provide education & encouragement re: need for participation in PT. PT & OT provide max encouragement/cuing re: participation & redirection to task at hand.      General Comments      Exercises     Assessment/Plan    PT Assessment Patient needs continued PT services  PT Problem List Decreased strength;Decreased mobility;Decreased safety awareness;Obesity;Decreased knowledge of precautions;Decreased range of motion;Decreased activity tolerance;Cardiopulmonary status limiting activity;Decreased skin integrity;Pain;Decreased balance;Decreased knowledge of use of DME       PT Treatment Interventions DME instruction;Therapeutic activities;Modalities;Gait training;Therapeutic exercise;Patient/family education;Wheelchair mobility training;Balance training;Functional mobility training;Manual  techniques;Neuromuscular re-education    PT Goals (Current goals can be found in the Care Plan section)  Acute Rehab PT Goals Patient Stated Goal: "to walk" PT Goal Formulation: With patient Time For Goal Achievement: 05/02/20 Potential to Achieve Goals: Fair    Frequency 7X/week   Barriers to discharge        Co-evaluation PT/OT/SLP Co-Evaluation/Treatment: Yes Reason for Co-Treatment: Complexity of the patient's impairments (multi-system involvement);Necessary to address cognition/behavior during functional activity;For patient/therapist safety;To address functional/ADL transfers PT goals addressed during session: Mobility/safety with mobility;Balance         AM-PAC PT "6 Clicks" Mobility  Outcome Measure Help needed turning from your back to your side while in a flat bed without using bedrails?: A Little Help needed moving from lying on your back to sitting on the side of a flat bed without using bedrails?: A Lot Help needed moving to and from a bed to a chair (including a wheelchair)?: Total Help needed standing up from a chair using your arms (e.g., wheelchair or bedside chair)?: Total Help needed to walk in hospital room?: Total Help needed climbing 3-5 steps with a railing? : Total 6 Click Score: 9    End of Session   Activity Tolerance: Patient tolerated  treatment well Patient left: in bed;with call bell/phone within reach;with bed alarm set;with SCD's reapplied, polar care donned, RLE Hemovac intact Nurse Communication:  (pt's request to take medication now) PT Visit Diagnosis: Muscle weakness (generalized) (M62.81);Difficulty in walking, not elsewhere classified (R26.2);Other abnormalities of gait and mobility (R26.89)    Time: 7619-5093 PT Time Calculation (min) (ACUTE ONLY): 43 min   Charges:   PT Evaluation $PT Eval Low Complexity: 1 Low PT Treatments $Therapeutic Activity: 8-22 mins        Lavone Nian, PT, DPT 04/18/20, 3:20 PM   [name] 04/18/2020, 3:01 PM

## 2020-04-18 NOTE — Progress Notes (Addendum)
Initial Nutrition Assessment  INTERVENTION:   -Ensure MAX Protein po BID, each supplement provides 150 kcal and 30 grams of protein  NUTRITION DIAGNOSIS:   Increased nutrient needs related to wound healing as evidenced by estimated needs.  GOAL:   Patient will meet greater than or equal to 90% of their needs  MONITOR:   PO intake,Supplement acceptance,Labs,Weight trends,I & O's,Skin  REASON FOR ASSESSMENT:   Malnutrition Screening Tool    ASSESSMENT:   75 y.o. female with a history of asthma, GERD, OSA on CPAP, anxiety, COPD, multiple lung nodules, as well as history of ICU admission for GI bleed with hemorrhagic shock who is being seen in consultation for chest pain which she developed postoperatively.  3/18: s/p removal of hardware from right ankle, I&D  Patient having some nausea today, no PO documented at this time.  Will order Ensure Max supplements given stage 1 pressure injury and post-op healing.  Per weight records, pt has lost 66 lbs since 11/08/19 (26% wt loss x 5 months, significant for time frame). Suspect some degree of malnutrition.  Medications: Ferrous sulfate, Reglan, Ocuvite, Miralax, KLOR-CON, Senokot, IV Zofran   Labs reviewed.  NUTRITION - FOCUSED PHYSICAL EXAM:  Unable to complete- working remote  Diet Order:   Diet Order            Diet regular Room service appropriate? Yes; Fluid consistency: Thin  Diet effective now                 EDUCATION NEEDS:   No education needs have been identified at this time  Skin:  Skin Assessment: Skin Integrity Issues: Skin Integrity Issues:: Stage I Stage I: bilateral buttocks  Last BM:  3/18  Height:   Ht Readings from Last 1 Encounters:  04/17/20 5\' 8"  (1.727 m)    Weight:   Wt Readings from Last 1 Encounters:  04/18/20 83.2 kg   BMI:  Body mass index is 27.9 kg/m.  Estimated Nutritional Needs:   Kcal:  1900-2100  Protein:  85-95g  Fluid:  1.9L/day  Jasmine Bibles, MS, RD,  LDN Inpatient Clinical Dietitian Contact information available via Amion

## 2020-04-18 NOTE — Progress Notes (Signed)
*  PRELIMINARY RESULTS* Echocardiogram 2D Echocardiogram has been performed.  Jasmine Morrow Jasmine Morrow 04/18/2020, 10:13 AM

## 2020-04-19 DIAGNOSIS — D519 Vitamin B12 deficiency anemia, unspecified: Secondary | ICD-10-CM

## 2020-04-19 DIAGNOSIS — D500 Iron deficiency anemia secondary to blood loss (chronic): Secondary | ICD-10-CM

## 2020-04-19 LAB — BASIC METABOLIC PANEL
Anion gap: 4 — ABNORMAL LOW (ref 5–15)
BUN: 14 mg/dL (ref 8–23)
CO2: 25 mmol/L (ref 22–32)
Calcium: 8.7 mg/dL — ABNORMAL LOW (ref 8.9–10.3)
Chloride: 110 mmol/L (ref 98–111)
Creatinine, Ser: 0.8 mg/dL (ref 0.44–1.00)
GFR, Estimated: >60 mL/min (ref >60)
Glucose, Bld: 99 mg/dL (ref 70–99)
Potassium: 3.9 mmol/L (ref 3.5–5.1)
Sodium: 139 mmol/L (ref 135–145)

## 2020-04-19 LAB — URINE CULTURE: Culture: 10000 — AB

## 2020-04-19 LAB — CBC
HCT: 23.1 % — ABNORMAL LOW (ref 36.0–46.0)
Hemoglobin: 7.2 g/dL — ABNORMAL LOW (ref 12.0–15.0)
MCH: 24.2 pg — ABNORMAL LOW (ref 26.0–34.0)
MCHC: 31.2 g/dL (ref 30.0–36.0)
MCV: 77.5 fL — ABNORMAL LOW (ref 80.0–100.0)
Platelets: 258 10*3/uL (ref 150–400)
RBC: 2.98 MIL/uL — ABNORMAL LOW (ref 3.87–5.11)
RDW: 15.8 % — ABNORMAL HIGH (ref 11.5–15.5)
WBC: 5.7 10*3/uL (ref 4.0–10.5)
nRBC: 0 % (ref 0.0–0.2)

## 2020-04-19 LAB — HEMOGLOBIN: Hemoglobin: 8.3 g/dL — ABNORMAL LOW (ref 12.0–15.0)

## 2020-04-19 LAB — PREPARE RBC (CROSSMATCH)

## 2020-04-19 MED ORDER — ONDANSETRON HCL 4 MG/2ML IJ SOLN: 4.0000 mg | Freq: Three times a day (TID) | INTRAMUSCULAR | Status: DC

## 2020-04-19 MED ORDER — SODIUM CHLORIDE 0.9 % IV SOLN
12.5000 mg | Freq: Four times a day (QID) | INTRAVENOUS | Status: DC | PRN
  Filled 2020-04-19 (×2): qty 0.5

## 2020-04-19 MED ORDER — MAGNESIUM HYDROXIDE 400 MG/5ML PO SUSP: 30.0000 mL | Freq: Every day | ORAL | Status: DC | PRN

## 2020-04-19 MED ORDER — ONDANSETRON HCL 4 MG/2ML IJ SOLN
4.0000 mg | Freq: Four times a day (QID) | INTRAMUSCULAR | Status: DC
  Administered 2020-04-19 – 2020-04-21 (×8): 4 mg via INTRAVENOUS
  Filled 2020-04-19 (×8): qty 2

## 2020-04-19 MED ORDER — SENNOSIDES-DOCUSATE SODIUM 8.6-50 MG PO TABS: 1.0000 | ORAL_TABLET | Freq: Every evening | ORAL | Status: DC | PRN

## 2020-04-19 MED ORDER — SODIUM CHLORIDE 0.9 % IV SOLN
510.0000 mg | Freq: Once | INTRAVENOUS | Status: AC
  Administered 2020-04-19: 510 mg via INTRAVENOUS
  Filled 2020-04-19: qty 17

## 2020-04-19 MED ORDER — CYANOCOBALAMIN 1000 MCG/ML IJ SOLN
1000.0000 ug | Freq: Every day | INTRAMUSCULAR | Status: DC
  Administered 2020-04-19 – 2020-04-22 (×4): 1000 ug via SUBCUTANEOUS
  Filled 2020-04-19 (×5): qty 1

## 2020-04-19 MED ORDER — RISAQUAD PO CAPS
1.0000 | ORAL_CAPSULE | Freq: Two times a day (BID) | ORAL | Status: DC
  Administered 2020-04-19 – 2020-04-23 (×9): 1 via ORAL
  Filled 2020-04-19 (×9): qty 1

## 2020-04-19 MED ORDER — BACID PO TABS: 2.0000 | ORAL_TABLET | Freq: Two times a day (BID) | ORAL | Status: DC

## 2020-04-19 MED ORDER — SODIUM CHLORIDE 0.9% IV SOLUTION: Freq: Once | INTRAVENOUS | Status: AC

## 2020-04-19 NOTE — Progress Notes (Signed)
Subjective: 2 Days Post-Op Procedure(s) (LRB): HARDWARE REMOVAL FROM RIGHT ANKLE AND RIGHT KNEE; IMPLANT OF CEMENT SPACERS IN RIGHT KNEE (Right) Patient reports pain as moderate in the right knee and mild in there right ankle.  Currently taking tramadol Patient is well but is have watery diarrhea. Patient with chest pain initially after surgery, has resolved at this time. Internal medicine was consulted. PT and Care management to assist with discharge planning.  Recommendation from yesterday was SNF. Negative for chest pain and shortness of breath Fever: no Gastrointestinal:Negative for nausea and vomiting this AM.  Objective: Vital signs in last 24 hours: Temp:  [97.5 F (36.4 C)-98 F (36.7 C)] 97.9 F (36.6 C) (03/20 0748) Pulse Rate:  [84-98] 84 (03/20 0748) Resp:  [18] 18 (03/20 0748) BP: (97-144)/(49-68) 144/68 (03/20 0748) SpO2:  [95 %-100 %] 100 % (03/20 0748)  Intake/Output from previous day:  Intake/Output Summary (Last 24 hours) at 04/19/2020 0903 Last data filed at 04/19/2020 0600 Gross per 24 hour  Intake 1370.77 ml  Output 450 ml  Net 920.77 ml    Intake/Output this shift: No intake/output data recorded.  Labs: Recent Labs    04/17/20 2248 04/18/20 0436 04/19/20 0348  HGB 9.5* 8.9* 7.2*   Recent Labs    04/18/20 0436 04/19/20 0348  WBC 8.8 5.7  RBC 3.69*  3.59* 2.98*  HCT 28.7* 23.1*  PLT 302 258   Recent Labs    04/18/20 0436 04/19/20 0348  NA 137 139  K 4.9 3.9  CL 105 110  CO2 25 25  BUN 17 14  CREATININE 1.09* 0.80  GLUCOSE 117* 99  CALCIUM 9.2 8.7*   No results for input(s): LABPT, INR in the last 72 hours.   EXAM General - Patient is Alert, Appropriate and Oriented Extremity - ABD soft Sensation intact distally  Bulky dressing removed today, mild drainage to the honeycomb.  Fresh honeycomb dressing was applied.  Polar care re-applied. Hemovac removed, 4x4 with tegaderm applied to the right knee. Dressing removed from the  right ankle, incisions healing well without purulent drainage. New 4x4 were applied to the right ankle, with ACE wrap. Patient is intact to light touch to the right leg. Able to gentle dorsiflex and plantarflex the ankle.  ABle to move toes. Cap refill intact.  Able to assist with a straight leg raise.  Knee ROM brace locked in extension to the right knee. Motor Function - intact, moving toes well on exam.  Past Medical History:  Diagnosis Date  . Anemia   . Anxiety   . Asthma    seasonal   . GERD (gastroesophageal reflux disease)   . Hypertension   . PONV (postoperative nausea and vomiting)   . Seasonal allergies   . Sleep apnea    does not uses CPAP machine anymore     Assessment/Plan: 2 Days Post-Op Procedure(s) (LRB): HARDWARE REMOVAL FROM RIGHT ANKLE AND RIGHT KNEE; IMPLANT OF CEMENT SPACERS IN RIGHT KNEE (Right) Principal Problem:   Chest pain Active Problems:   Extrinsic asthma   OSA on CPAP   Multiple lung nodules   Pressure injury of skin   COPD (chronic obstructive pulmonary disease) (HCC)   GERD (gastroesophageal reflux disease)   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HCC)   Anxiety   History of GI diverticular bleed  Estimated body mass index is 27.9 kg/m as calculated from the following:   Height as of this encounter: 5\' 8"  (1.727 m).   Weight as of this encounter:  83.2 kg. Advance diet Up with therapy   Labs reviewed this AM. WBC 5.7, Hg 7.2 this AM.  CBC ordered for tomorrow morning. Gram stain with abundant wbc present, both PMN and Mononuclear.  Culture is pending.  Currently on cipro and rifampin. Foley discontinued yesterday, Urine culture was ordered, no results yet.  Denies any urinary symptoms to me this AM. Fresh honeycomb and ankle dressing applied today.  Hemovac removed. Knee brace locked in extension, keep locked with PT.   Patient with indigestion, encouraged using the reglan and zofran.  Continue protonix. Patient with watery diarrhea.   Most likely due to ingestion of Miralax, MoM and stool softeners yesterday however the patient has been on long-term antibiotics.  Will check for C-diff.  Started on Probiotic today as well. Continue with PT as tolerated, continue to monitor return of cultures.  DVT Prophylaxis - Lovenox and Foot Pumps Toe-touch weightbearing to the right leg.  Raquel Alamin [place], PA-C [facility] 04/19/2020, 9:03 AM

## 2020-04-19 NOTE — Progress Notes (Signed)
PROGRESS NOTE    Jasmine Morrow   IBB:[id number]  DOB: 1945/02/17  DOA: 04/17/2020 PCP: Rusty Aus, MD   Brief Narrative:  Jasmine Morrow is an 75 y.o. female with a history of asthma, GERD, OSA on CPAP, anxiety, COPD, multiple lung nodules, as well as history of ICU admission for GI bleed with hemorrhagic shock who is being seen in consultation for chest pain which she developed postoperatively.     Subjective: Nausea is preventing her from eating and drinking. She is also having diarrhea but not as severe as a few days ago. No further chest pain.   Assessment & Plan:   Principal Problem:   Chest pain - troponin I negative - 2 D ECHO reassuring w/o wall motion abnormalities or pericardial effusion - ? If GI related, cont PPI  Active Problems:  Nausea - she believes it is due to Cipro- start Zofran QID  Diarrhea - abdomen only mildly tender and not distended- this is not C diff - d/c C diff testing - reduce Miralax and Senna to PRN rather than daily.  - DC Reglan (was is TID routine) which causes diarrhea  AKI - improving - in setting of infection, diarrhea and poor oral intake - Cr 0.6-0.7>>  Cr 1.09>> 0.80 - follow urine output  Hypotension - will place holding parameters on Coreg  Microcytic anemia- Iron and B12 deficinecy -  Iron              Iron    UIBC        207      UIBC   TIBC        230      TIBC   Saturation Ratios        10      Saturation Ratios   Ferritin        106      Ferritin   Folate        6.0      Folate   OTHER CHEM  Vitamin B12       171   - Ferritin is falsely elevated as an acute phase reactant as a result of the septic arthritis  - she will need Feraheme and s/c B12 injections - Hb 7.2 today- will give 1 U PRBC     Extrinsic asthma/ COPD - stable- PRN Nebs ordered  Hyperlipidemia - LDL 107    Multiple lung nodules - needs outpt follow up     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    Anxiety - cont  Lexapro      Time spent in minutes: 30 DVT prophylaxis: per ortho Code Status: full code Family Communication: none- patient oriented Antimicrobials:  Anti-infectives (From admission, onward)   Start     Dose/Rate Route Frequency Ordered Stop   04/17/20 2345  ciprofloxacin (CIPRO) tablet 500 mg        500 mg Oral 2 times daily 04/17/20 2245     04/17/20 2345  rifampin (RIFADIN) capsule 300 mg        300 mg Oral 2 times daily 04/17/20 2245     04/17/20 2345  ceFAZolin (ANCEF) IVPB 2g/100 mL premix        2 g 200 mL/hr over 30 Minutes Intravenous Every 6 hours 04/17/20 2245 04/18/20 0555   04/17/20 1842  gentamicin (GARAMYCIN) injection  Status:  Discontinued          As needed 04/17/20 1843 04/17/20  1918   04/17/20 1730  vancomycin (VANCOCIN) powder  Status:  Discontinued          As needed 04/17/20 1730 04/17/20 1918   04/17/20 1728  Tobramycin Sulfate POWD  Status:  Discontinued          As needed 04/17/20 1730 04/17/20 1918   04/17/20 0930  ceFAZolin (ANCEF) 2-4 GM/100ML-% IVPB       Note to Pharmacy: Ronnell Freshwater   : cabinet override      04/17/20 0930 04/17/20 1204   04/17/20 0600  ceFAZolin (ANCEF) IVPB 2g/100 mL premix        2 g 200 mL/hr over 30 Minutes Intravenous On call to O.R. 04/17/20 0038 04/17/20 1607       Objective: Vitals:   04/18/20 1946 04/19/20 0500 04/19/20 0748 04/19/20 1123  BP: (!) 116/49 (!) 132/52 (!) 144/68 (!) 116/52  Pulse: 98 94 84 91  Resp: 18  18 18   Temp: (!) 97.5 F (36.4 C) 98 F (36.7 C) 97.9 F (36.6 C) 98.8 F (37.1 C)  TempSrc: Oral Oral Oral Oral  SpO2: 98% 100% 100% 100%  Weight:      Height:        Intake/Output Summary (Last 24 hours) at 04/19/2020 1143 Last data filed at 04/19/2020 0600 Gross per 24 hour  Intake 1370.77 ml  Output 450 ml  Net 920.77 ml   Filed Weights   04/17/20 0903 04/18/20 0016  Weight: 86.2 kg 83.2 kg    Examination: General exam: Appears comfortable  HEENT: PERRLA, oral mucosa moist,  no sclera icterus or thrush Respiratory system: Clear to auscultation. Respiratory effort normal. Cardiovascular system: S1 & S2 heard, regular rate and rhythm Gastrointestinal system: Abdomen soft, non-tender, nondistended. Normal bowel sounds   Central nervous system: Alert and oriented. No focal neurological deficits. Extremities: No cyanosis, clubbing or edema- left leg has immobilizer on Skin: No rashes or ulcers Psychiatry:  Mood & affect appropriate.    Data Reviewed: I have personally reviewed following labs and imaging studies  CBC: Recent Labs  Lab 04/17/20 2248 04/18/20 0436 04/19/20 0348  WBC 13.8* 8.8 5.7  NEUTROABS 11.8*  --   --   HGB 9.5* 8.9* 7.2*  HCT 31.3* 28.7* 23.1*  MCV 78.3* 77.8* 77.5*  PLT 370 302 371   Basic Metabolic Panel: Recent Labs  Lab 04/17/20 2248 04/18/20 0436 04/19/20 0348  NA 138 137 139  K 4.9 4.9 3.9  CL 105 105 110  CO2 24 25 25   GLUCOSE 142* 117* 99  BUN 16 17 14   CREATININE 1.06* 1.09* 0.80  CALCIUM 9.7 9.2 8.7*   GFR: Estimated Creatinine Clearance: 70.8 mL/min (by C-G formula based on SCr of 0.8 mg/dL). Liver Function Tests: Recent Labs  Lab 04/17/20 2248  AST 37  ALT 26  ALKPHOS 91  BILITOT 0.6  PROT 6.2*  ALBUMIN 2.9*   No results for input(s): LIPASE, AMYLASE in the last 168 hours. No results for input(s): AMMONIA in the last 168 hours. Coagulation Profile: No results for input(s): INR, PROTIME in the last 168 hours. Cardiac Enzymes: No results for input(s): CKTOTAL, CKMB, CKMBINDEX, TROPONINI in the last 168 hours. BNP (last 3 results) No results for input(s): PROBNP in the last 8760 hours. HbA1C: No results for input(s): HGBA1C in the last 72 hours. CBG: No results for input(s): GLUCAP in the last 168 hours. Lipid Profile: Recent Labs    04/17/20 2248  CHOL 175  HDL 41  LDLCALC  107*  TRIG 137  CHOLHDL 4.3   Thyroid Function Tests: Recent Labs    04/17/20 2248  TSH 3.117   Anemia  Panel: Recent Labs    04/18/20 0436 04/18/20 0802  VITAMINB12  --  171*  FOLATE 6.0  --   FERRITIN 106  --   TIBC 230*  --   IRON 23*  --   RETICCTPCT 2.0  --    Urine analysis:    Component Value Date/Time   COLORURINE AMBER (A) 04/18/2020 1235   APPEARANCEUR CLOUDY (A) 04/18/2020 1235   LABSPEC 1.023 04/18/2020 1235   PHURINE 5.0 04/18/2020 1235   GLUCOSEU NEGATIVE 04/18/2020 1235   HGBUR MODERATE (A) 04/18/2020 1235   BILIRUBINUR NEGATIVE 04/18/2020 1235   KETONESUR NEGATIVE 04/18/2020 1235   PROTEINUR 30 (A) 04/18/2020 1235   NITRITE NEGATIVE 04/18/2020 1235   LEUKOCYTESUR LARGE (A) 04/18/2020 1235   Sepsis Labs: @LABRCNTIP (procalcitonin:4,lacticidven:4) ) Recent Results (from the past 240 hour(s))  SARS Coronavirus 2 by RT PCR (hospital order, performed in [facility] hospital lab) Nasopharyngeal Nasopharyngeal Swab     Status: None   Collection Time: 04/17/20  9:01 AM   Specimen: Nasopharyngeal Swab  Result Value Ref Range Status   SARS Coronavirus 2 NEGATIVE NEGATIVE Final    Comment: (NOTE) SARS-CoV-2 target nucleic acids are NOT DETECTED.  The SARS-CoV-2 RNA is generally detectable in upper and lower respiratory specimens during the acute phase of infection. The lowest concentration of SARS-CoV-2 viral copies this assay can detect is 250 copies / mL. A negative result does not preclude SARS-CoV-2 infection and should not be used as the sole basis for treatment or other patient management decisions.  A negative result may occur with improper specimen collection / handling, submission of specimen other than nasopharyngeal swab, presence of viral mutation(s) within the areas targeted by this assay, and inadequate number of viral copies (<250 copies / mL). A negative result must be combined with clinical observations, patient history, and epidemiological information.  Fact Sheet for Patients:   StrictlyIdeas.no  Fact Sheet for  Healthcare Providers: BankingDealers.co.za  This test is not yet approved or  cleared by the [place] FDA and has been authorized for detection and/or diagnosis of SARS-CoV-2 by FDA under an Emergency Use Authorization (EUA).  This EUA will remain in effect (meaning this test can be used) for the duration of the COVID-19 declaration under Section 564(b)(1) of the Act, 21 U.S.C. section 360bbb-3(b)(1), unless the authorization is terminated or revoked sooner.  Performed at [facility], [address], [place], [place] [address]   Surgical pcr screen     Status: None   Collection Time: 04/17/20  9:38 AM   Specimen: Nasal Mucosa; Nasal Swab  Result Value Ref Range Status   MRSA, PCR NEGATIVE NEGATIVE Final   Staphylococcus aureus NEGATIVE NEGATIVE Final    Comment: (NOTE) The Xpert SA Assay (FDA approved for NASAL specimens in patients 73 years of age and older), is one component of a comprehensive surveillance program. It is not intended to diagnose infection nor to guide or monitor treatment. Performed at [facility], [place], [place] [address]   Aerobic/Anaerobic Culture w Gram Stain (surgical/deep wound)     Status: None (Preliminary result)   Collection Time: 04/17/20 12:23 PM   Specimen: Ankle  Result Value Ref Range Status   Specimen Description ANKLE  Final   Special Requests POSTERIOR LATERAL RIGHT ANKLE PLATE  Final   Gram Stain  Final    FEW WBC PRESENT, PREDOMINANTLY PMN NO ORGANISMS SEEN    Culture   Final    NO GROWTH 2 DAYS NO ANAEROBES ISOLATED; CULTURE IN PROGRESS FOR 5 DAYS Performed at [facility] [address], [place], [place] [address]    Report Status PENDING  Incomplete  Gram stain     Status: None   Collection Time: 04/17/20  1:00 PM   Specimen: PATH Other; Tissue  Result Value Ref Range Status   Specimen Description ANKLE POSTERIOR LATERAL ANKLE RIGHT PLATE  Final   Special  Requests NONE  Final   Gram Stain   Final    NO ORGANISMS SEEN FEW WBC SEEN Performed at [facility], [address] [place], [place] [address]    Report Status 04/17/2020 FINAL  Final  Gram stain     Status: None   Collection Time: 04/17/20  1:16 PM   Specimen: PATH Other; Tissue  Result Value Ref Range Status   Specimen Description ANKLE  Final   Special Requests NONE  Final   Gram Stain   Final    NO ORGANISMS SEEN FEW WBC SEEN Performed at [facility], [address] [place], [place] [address]    Report Status 04/17/2020 FINAL  Final  Aerobic/Anaerobic Culture w Gram Stain (surgical/deep wound)     Status: None (Preliminary result)   Collection Time: 04/17/20  1:16 PM   Specimen: PATH Other; Tissue  Result Value Ref Range Status   Specimen Description   Final    ANKLE Performed at [facility], [address] [place], [place] [address]    Special Requests   Final    NONE Performed at [facility], [address] [place], [place] [address]    Gram Stain   Final    RARE WBC PRESENT, PREDOMINANTLY PMN NO ORGANISMS SEEN    Culture   Final    NO GROWTH 2 DAYS NO ANAEROBES ISOLATED; CULTURE IN PROGRESS FOR 5 DAYS Performed at [facility], [place] [address] [place], [place] [address]    Report Status PENDING  Incomplete  Gram stain     Status: None   Collection Time: 04/17/20  1:25 PM   Specimen: PATH Other; Tissue  Result Value Ref Range Status   Specimen Description WOUND RIGHT DISTAL FIBULA  Final   Special Requests NONE  Final   Gram Stain   Final    NO ORGANISMS SEEN FEW WBC SEEN Performed at [facility], [address], [place], [place] [address]    Report Status 04/17/2020 FINAL  Final  Aerobic/Anaerobic Culture w Gram Stain (surgical/deep wound)     Status: None (Preliminary result)   Collection Time: 04/17/20  1:25 PM   Specimen: PATH Other; Tissue  Result Value Ref Range Status   Specimen  Description   Final    WOUND RIGHT DISTAL FIBULA Performed at [facility], [address], [place], [place] [address]    Special Requests   Final    NONE Performed at [facility], [address] [place],  [address]    Gram Stain   Final    FEW WBC PRESENT, PREDOMINANTLY PMN NO ORGANISMS SEEN    Culture   Final    NO GROWTH 2 DAYS NO ANAEROBES ISOLATED; CULTURE IN PROGRESS FOR 5 DAYS Performed at [facility] [address] [place],  [address]    Report Status PENDING  Incomplete  Gram stain     Status: None   Collection Time: 04/17/20  2:24 PM   Specimen: PATH Other; Tissue  Result Value Ref Range Status   Specimen Description WOUND  Final   Special Requests MEDIAL RIGHT SCREW  Final   Gram Stain   Final    NO ORGANISMS SEEN FEW WBC SEEN RESULT CALLED TO, READ BACK BY AND VERIFIED WITH: ADRIENNE ALLRED AT 5009 ON 04/17/20 BY SS Performed at [facility], [address], [place], [place] [address]    Report Status 04/17/2020 FINAL  Final  Aerobic/Anaerobic Culture w Gram Stain (surgical/deep wound)     Status: None (Preliminary result)   Collection Time: 04/17/20  2:24 PM   Specimen: PATH Other; Tissue  Result Value Ref Range Status   Specimen Description   Final    WOUND Performed at [facility], [address] [place], [place] [address]    Special Requests   Final    MEDIAL SCREW RIGHT Performed at [facility], [address] [place], [place] [address]    Gram Stain   Final    RARE WBC PRESENT,BOTH PMN AND MONONUCLEAR NO ORGANISMS SEEN    Culture   Final    NO GROWTH 1 DAY Performed at [facility], [place] [address], [place], [place] [address]    Report Status PENDING  Incomplete  Gram stain     Status: None   Collection Time: 04/17/20  2:29 PM   Specimen: PATH Other; Tissue  Result Value Ref Range Status   Specimen Description WOUND  Final   Special Requests MEDIAL RIGHT ANKLE   Final   Gram Stain   Final    NO ORGANISMS SEEN RARE WBC SEEN RESULT CALLED TO, READ BACK BY AND VERIFIED WITH: ADRIENNE ALLRED AT 1751 ON 04/17/20 BY SS Performed at [facility], [address], [place], [place] [address]    Report Status 04/17/2020 FINAL  Final  Aerobic/Anaerobic Culture w Gram Stain (surgical/deep wound)     Status: None (Preliminary result)   Collection Time: 04/17/20  2:29 PM   Specimen: PATH Other; Tissue  Result Value Ref Range Status   Specimen Description   Final    WOUND Performed at [facility], [address] [place], [place] [address]    Special Requests MEDIAL RIGHT ANKLE  Final   Gram Stain NO WBC SEEN NO ORGANISMS SEEN   Final   Culture   Final    NO GROWTH 1 DAY Performed at [facility], [place] [address] [place], [place] [address]    Report Status PENDING  Incomplete  Gram stain     Status: None   Collection Time: 04/17/20  3:51 PM   Specimen: PATH Other; Tissue  Result Value Ref Range Status   Specimen Description TISSUE  Final   Special Requests RIGHT KNEE ASPIRATION  Final   Gram Stain   Final    NO ORGANISMS SEEN MANY WBC SEEN Performed at [facility], [address] [place], [place] [address]    Report Status 04/17/2020 FINAL  Final  Aerobic/Anaerobic Culture w Gram Stain (surgical/deep wound)     Status: None (Preliminary result)   Collection Time: 04/17/20  3:51 PM   Specimen: PATH Other; Tissue  Result Value Ref Range Status   Specimen Description   Final    TISSUE Performed at [facility], [address] [place], [place] [address]  Special Requests   Final     RIGHT KNEE ASPIRATION Performed at [facility], [place], [place] [address]    Gram Stain   Final    MODERATE WBC PRESENT,BOTH PMN AND MONONUCLEAR NO ORGANISMS SEEN    Culture   Final    NO GROWTH 1 DAY Performed at [facility], [place] [address] [place], [place] [address]     Report Status PENDING  Incomplete  Gram stain     Status: None   Collection Time: 04/17/20  3:58 PM   Specimen: PATH Other; Tissue  Result Value Ref Range Status   Specimen Description KNEE KNEE  Final   Special Requests NONE  Final   Gram Stain   Final    NO ORGANISMS SEEN FEW WBC SEEN Performed at [facility], [address] [place], [place] [address]    Report Status 04/17/2020 FINAL  Final  Gram stain     Status: None   Collection Time: 04/17/20  3:58 PM   Specimen: PATH Other; Tissue  Result Value Ref Range Status   Specimen Description WOUND  Final   Special Requests NONE  Final   Gram Stain   Final    NO ORGANISMS SEEN FEW WBC SEEN Performed at [facility], [address] [place], [place] [address]    Report Status 04/17/2020 FINAL  Final  Aerobic/Anaerobic Culture w Gram Stain (surgical/deep wound)     Status: None (Preliminary result)   Collection Time: 04/17/20  3:58 PM   Specimen: PATH Other; Tissue  Result Value Ref Range Status   Specimen Description   Final    WOUND Performed at [facility], [address], [place], [place] [address]    Special Requests   Final    ANTERIOR KNEE Performed at [facility], [address] [place], [place] [address]    Gram Stain   Final    RARE WBC PRESENT, PREDOMINANTLY MONONUCLEAR NO ORGANISMS SEEN    Culture   Final    NO GROWTH 1 DAY Performed at [facility], [place] [address], [place], [place] [address]    Report Status PENDING  Incomplete  Aerobic/Anaerobic Culture w Gram Stain (surgical/deep wound)     Status: None (Preliminary result)   Collection Time: 04/17/20  3:58 PM   Specimen: PATH Other; Tissue  Result Value Ref Range Status   Specimen Description   Final    WOUND Performed at [facility], [address], [place], [place] [address]    Special Requests POSTERIOR RECESS RIGHT KNEE  Final   Gram Stain   Final    RARE WBC PRESENT, PREDOMINANTLY  PMN NO ORGANISMS SEEN    Culture   Final    NO GROWTH 1 DAY Performed at [facility], [place] [address], [place], [place] [address]    Report Status PENDING  Incomplete  Gram stain     Status: None   Collection Time: 04/17/20  4:24 PM   Specimen: PATH Other; Tissue  Result Value Ref Range Status   Specimen Description TISSUE  Final   Special Requests RIGHT FEMUR UNDER IMPLANT  Final   Gram Stain   Final    NO ORGANISMS SEEN FEW WBC SEEN Performed at [facility], [address], [place], [place] [address]    Report Status 04/17/2020 FINAL  Final  Aerobic/Anaerobic Culture w Gram Stain (surgical/deep wound)     Status: None (  Preliminary result)   Collection Time: 04/17/20  4:24 PM   Specimen: PATH Other; Tissue  Result Value Ref Range Status   Specimen Description   Final    TISSUE Performed at [facility], [address], [place], [place] [address]    Special Requests   Final     RIGHT FEMUR UNDER IMPLANT Performed at [facility], [place], [place] [address]    Gram Stain   Final    RARE WBC PRESENT,BOTH PMN AND MONONUCLEAR NO ORGANISMS SEEN    Culture   Final    NO GROWTH 1 DAY Performed at [facility], [place] [address], [place], [place] [address]    Report Status PENDING  Incomplete  Gram stain     Status: None   Collection Time: 04/17/20  4:56 PM   Specimen: PATH Other; Tissue  Result Value Ref Range Status   Specimen Description TISSUE  Final   Special Requests TIBIAL CANAL  Final   Gram Stain   Final    NO ORGANISMS SEEN MANY WBC SEEN Performed at [facility], [address], [place], [place] [address]    Report Status 04/17/2020 FINAL  Final  Aerobic/Anaerobic Culture w Gram Stain (surgical/deep wound)     Status: None (Preliminary result)   Collection Time: 04/17/20  4:56 PM   Specimen: PATH Other; Tissue  Result Value Ref Range Status   Specimen Description   Final    TISSUE Performed  at [facility], [address], [place], [place] [address]    Special Requests   Final    TIBIAL CANAL Performed at [facility], [address] [place], [place] [address]    Gram Stain   Final    ABUNDANT WBC PRESENT,BOTH PMN AND MONONUCLEAR NO ORGANISMS SEEN    Culture   Final    NO GROWTH 1 DAY Performed at [facility], [place] [address], [place], [place] [address]    Report Status PENDING  Incomplete         Radiology Studies: DG Ankle 2 Views Right  Result Date: 04/17/2020 CLINICAL DATA:  Hardware removal EXAM: RIGHT ANKLE - 2 VIEW COMPARISON:  January 22, 2020 FINDINGS: Surgical hardware is been removed from the tibia and fibula. An of attic beads are identified. Skin staples are noted. IMPRESSION: Surgical hardware has been removed as above. Osteopenia. Skin staples. Antibiotic beads identified. Electronically Signed   By: Dorise Bullion III M.D   On: 04/17/2020 20:18   DG Chest Port 1 View  Result Date: 04/17/2020 CLINICAL DATA:  Chest pain EXAM: PORTABLE CHEST 1 VIEW COMPARISON:  02/20/2019 FINDINGS: Heart and mediastinal contours are within normal limits. No focal opacities or effusions. No acute bony abnormality. IMPRESSION: No active disease. Electronically Signed   By: Rolm Baptise M.D.   On: 04/17/2020 22:31   DG Knee Right Port  Result Date: 04/17/2020 CLINICAL DATA:  Hardware removal. EXAM: PORTABLE RIGHT KNEE - 1-2 VIEW COMPARISON:  Preoperative radiograph 01/22/2020 FINDINGS: Removal of right knee arthroplasty with placement of spacer. No periprosthetic lucency or fracture. There is been patellar resurfacing. Surgical drain is in place. Anterior skin staples with postsurgical edema soft tissues. IMPRESSION: Removal of right knee arthroplasty with placement of spacer. No immediate postoperative complication. Electronically Signed   By: Keith Rake M.D.   On: 04/17/2020 20:23   ECHOCARDIOGRAM COMPLETE  Result Date: 04/18/2020     ECHOCARDIOGRAM REPORT   Patient Name:  Yates Decamp Date of Exam: 04/18/2020 Medical Rec #:  [id number]       Height:       68.0 in Accession #:    [id number]      Weight:       183.5 lb Date of Birth:  [birth year]       BSA:          1.970 m Patient Age:    50 years        BP:           97/65 mmHg Patient Gender: F               HR:           90 bpm. Exam Location:  [facility] Procedure: 2D Echo Indications:     Chest Pain R07.9  History:         Patient has prior history of Echocardiogram examinations, most                  recent 02/26/2019.  Sonographer:     Arville Go RDCS Referring Phys:  [id number] Athena Masse Diagnosing Phys: Kate Sable MD IMPRESSIONS  1. Left ventricular ejection fraction, by estimation, is 55 to 60%. The left ventricle has normal function. The left ventricle has no regional wall motion abnormalities. Left ventricular diastolic parameters are consistent with Grade I diastolic dysfunction (impaired relaxation).  2. Right ventricular systolic function is normal. The right ventricular size is normal.  3. The mitral valve is normal in structure. No evidence of mitral valve regurgitation.  4. The aortic valve was not well visualized. Aortic valve regurgitation is not visualized. Mild aortic valve sclerosis is present, with no evidence of aortic valve stenosis.  5. The inferior vena cava is normal in size with greater than 50% respiratory variability, suggesting right atrial pressure of 3 mmHg. FINDINGS  Left Ventricle: Left ventricular ejection fraction, by estimation, is 55 to 60%. The left ventricle has normal function. The left ventricle has no regional wall motion abnormalities. The left ventricular internal cavity size was normal in size. There is  no left ventricular hypertrophy. Left ventricular diastolic parameters are consistent with Grade I diastolic dysfunction (impaired relaxation). Right Ventricle: The right ventricular size is normal. No increase in right ventricular wall  thickness. Right ventricular systolic function is normal. Left Atrium: Left atrial size was normal in size. Right Atrium: Right atrial size was normal in size. Pericardium: There is no evidence of pericardial effusion. Mitral Valve: The mitral valve is normal in structure. No evidence of mitral valve regurgitation. Tricuspid Valve: The tricuspid valve is normal in structure. Tricuspid valve regurgitation is not demonstrated. Aortic Valve: The aortic valve was not well visualized. Aortic valve regurgitation is not visualized. Mild aortic valve sclerosis is present, with no evidence of aortic valve stenosis. Aortic valve peak gradient measures 6.6 mmHg. Pulmonic Valve: The pulmonic valve was not well visualized. Pulmonic valve regurgitation is not visualized. Aorta: The aortic root is normal in size and structure. Venous: The inferior vena cava is normal in size with greater than 50% respiratory variability, suggesting right atrial pressure of 3 mmHg. IAS/Shunts: No atrial level shunt detected by color flow Doppler.  LEFT VENTRICLE PLAX 2D LVIDd:         4.46 cm  Diastology LVIDs:         3.19 cm  LV e' medial:    4.68 cm/s LV PW:         0.94 cm  LV E/e' medial:  17.2 LV IVS:        0.91 cm  LV e' lateral:   5.87 cm/s LVOT diam:     1.80 cm  LV E/e' lateral: 13.7 LV SV:         53 LV SV Index:   27 LVOT Area:     2.54 cm  RIGHT VENTRICLE RV Basal diam:  3.19 cm RV S prime:     13.50 cm/s TAPSE (M-mode): 2.0 cm LEFT ATRIUM             Index       RIGHT ATRIUM          Index LA diam:        2.80 cm 1.42 cm/m  RA Area:     6.25 cm LA Vol (A2C):   25.6 ml 12.99 ml/m RA Volume:   9.82 ml  4.98 ml/m LA Vol (A4C):   20.5 ml 10.40 ml/m LA Biplane Vol: 23.9 ml 12.13 ml/m  AORTIC VALVE                PULMONIC VALVE AV Area (Vmax): 2.33 cm    PV Vmax:       0.94 m/s AV Vmax:        128.00 cm/s PV Peak grad:  3.5 mmHg AV Peak Grad:   6.6 mmHg LVOT Vmax:      117.00 cm/s LVOT Vmean:     81.600 cm/s LVOT VTI:       0.208 m   AORTA Ao Root diam: 2.90 cm MITRAL VALVE                TRICUSPID VALVE MV Area (PHT): 4.10 cm     TV Peak grad:   21.0 mmHg MV Decel Time: 185 msec     TV Vmax:        2.29 m/s MV E velocity: 80.60 cm/s MV A velocity: 109.00 cm/s  SHUNTS MV E/A ratio:  0.74         Systemic VTI:  0.21 m                             Systemic Diam: 1.80 cm Kate Sable MD Electronically signed by Kate Sable MD Signature Date/Time: 04/18/2020/2:11:36 PM    Final       Scheduled Meds: . acidophilus  1 capsule Oral BID  . carvedilol  3.125 mg Oral BID  . Chlorhexidine Gluconate Cloth  6 each Topical Daily  . ciprofloxacin  500 mg Oral BID  . enoxaparin (LOVENOX) injection  30 mg Subcutaneous Q12H  . escitalopram  10 mg Oral Daily  . ferrous sulfate  325 mg Oral BID WC  . loratadine  10 mg Oral Daily  . multivitamin-lutein  1 capsule Oral BID  . ondansetron (ZOFRAN) IV  4 mg Intravenous Q6H  . pantoprazole  40 mg Oral BID  . polyethylene glycol  17 g Oral Daily  . Ensure Max Protein  11 oz Oral BID  . rifampin  300 mg Oral BID   Continuous Infusions: . sodium chloride 100 mL/hr at 04/19/20 1053  . promethazine (PHENERGAN) injection       LOS: 2 days      Debbe Odea, MD [facility] Pager: [email or web address] 04/19/2020, 11:43 AM

## 2020-04-19 NOTE — Progress Notes (Signed)
Physical Therapy Treatment Patient Details Name: Jasmine Morrow MRN: [id number] DOB: 01/03/1946 Today's Date: 04/19/2020    History of Present Illness Pt is a 75 y/o F admitted on 04/16/20 via EMS for re-evaluation of R knee & R ankle. Pt is 2.5 months s/p R knee arthrotomy, I&D, polyethylene exchange & placement of antibiotic eluding beads. Cultures were (+) for methicillin sensitive staph aureus & pt completed course of ancef & has continued with other medications. Pt continues to have serous drainage from medial lesion of R ankle which grew pseudomonas putida. Pt underwent hardware removal from R ankle with I&D & placement of antibiotic beads & R knee arthrotomy, extensive I&D, removal of R TKA implants & placement of antibiotic cement spacer on 04/17/20 by Dr. Marry Guan. Pt is now TDWB RLE in R knee immobilizer. Post-operatively pt developed chest pain & hospitalists were consulted.  PMH: asthma, GERD, OSA on CPAP, anxiety, COPD, multiple lung nodules, hx of ICU admission for GI bleed with hemorrhagic shock.    PT Comments    Pt seen for PT tx with pt noting she's on bedpan. PT provides instructional cuing & assist for rolling L<>R while nursing staff completes peri hygiene dependently. Pt is able to transfer supine>sit with min assist +1 with use of hospital bed features & PT supporting RLE. Once sitting EOB pt becomes more anxious & reports dizziness & despite cuing for pursed lip breathing pt requests to lie back down & does so with min assist +2. Educated pt on goal of completing lateral scoot bed<>drop arm recliner while in hospital (likely will require +2 assist 2/2 need for 1 person to hold pt's RLE & 2nd person to assist with scooting). Also discussed potential need for hoyer lift at home to promote OOB mobility if pt is unable to achieve bed<>chair transfers while in acute setting - pt reports she will think about DME. Will continue to follow pt acutely to progress transfers as able & focus on  strengthening & overall activity tolerance.     Follow Up Recommendations  Home health PT;Supervision for mobility/OOB     Equipment Recommendations   (potential need for hoyer lift if unable to complete bed<>chair transfers)    Recommendations for Other Services       Precautions / Restrictions Precautions Precautions: Fall Required Braces or Orthoses: Other Brace Other Brace: R hinged knee brace locked in extension Restrictions Weight Bearing Restrictions: Yes RLE Weight Bearing: Touchdown weight bearing (TDWB)    Mobility  Bed Mobility Overal bed mobility: Needs Assistance Bed Mobility: Supine to Sit;Sit to Supine;Rolling Rolling: Supervision;Min assist (supervision rolling R, min assist rolling L, cuing for technique)   Supine to sit: Min assist;HOB elevated Sit to supine: Min assist;+2 for physical assistance;HOB elevated   General bed mobility comments: PT supporting RLE, assistance with lowering trunk sit>supine 2/2 c/o dizziness, use of bed rails & extra time    Transfers                    Ambulation/Gait                 Stairs             Wheelchair Mobility    Modified Rankin (Stroke Patients Only)       Balance Overall balance assessment: Mild deficits observed, not formally tested;Needs assistance Sitting-balance support: Feet supported;Bilateral upper extremity supported Sitting balance-Leahy Scale: Fair Sitting balance - Comments: supervision sitting EOB  Cognition Arousal/Alertness: Awake/alert Behavior During Therapy: Anxious Overall Cognitive Status: Within Functional Limits for tasks assessed                                 General Comments: extremely anxious, internally distracted. PT provide max encouragement & cuing to sustain attention to task at hand      Exercises      General Comments        Pertinent Vitals/Pain Pain Assessment:  Faces Faces Pain Scale: Hurts a little bit Pain Location: RLE Pain Descriptors / Indicators: Guarding Pain Intervention(s): Limited activity within patient's tolerance;Monitored during session    Home Living                      Prior Function            PT Goals (current goals can now be found in the care plan section) Acute Rehab PT Goals Patient Stated Goal: to walk and to be able to use the bathroom at will PT Goal Formulation: With patient Time For Goal Achievement: 05/02/20 Potential to Achieve Goals: Fair Progress towards PT goals: Progressing toward goals    Frequency    7X/week      PT Plan Current plan remains appropriate    Co-evaluation              AM-PAC PT "6 Clicks" Mobility   Outcome Measure  Help needed turning from your back to your side while in a flat bed without using bedrails?: A Little Help needed moving from lying on your back to sitting on the side of a flat bed without using bedrails?: A Lot Help needed moving to and from a bed to a chair (including a wheelchair)?: Total Help needed standing up from a chair using your arms (e.g., wheelchair or bedside chair)?: Total Help needed to walk in hospital room?: Total Help needed climbing 3-5 steps with a railing? : Total 6 Click Score: 9    End of Session Equipment Utilized During Treatment:  (R knee brace locked in extension throughout) Activity Tolerance:  (limited by dizziness) Patient left: in bed;with bed alarm set;with call bell/phone within reach;with SCD's reapplied (SCD applied to L foot, polar care donned)   PT Visit Diagnosis: Muscle weakness (generalized) (M62.81);Difficulty in walking, not elsewhere classified (R26.2);Other abnormalities of gait and mobility (R26.89)     Time: 9147-8295 PT Time Calculation (min) (ACUTE ONLY): 32 min  Charges:  $Therapeutic Activity: 23-37 mins                     Lavone Nian, PT, DPT 04/19/20, 10:40 AM    [name] 04/19/2020, 10:37 AM

## 2020-04-19 NOTE — Progress Notes (Signed)
Patient was DNR prior to sx, now full code but expresses that she would like to be DNR. MD notified. Patient states she will discuss with Dr. Marry Guan.

## 2020-04-20 ENCOUNTER — Encounter: Payer: Self-pay | Admitting: Orthopedic Surgery

## 2020-04-20 DIAGNOSIS — D638 Anemia in other chronic diseases classified elsewhere: Secondary | ICD-10-CM

## 2020-04-20 DIAGNOSIS — R110 Nausea: Secondary | ICD-10-CM

## 2020-04-20 DIAGNOSIS — T8453XD Infection and inflammatory reaction due to internal right knee prosthesis, subsequent encounter: Secondary | ICD-10-CM

## 2020-04-20 DIAGNOSIS — Z8619 Personal history of other infectious and parasitic diseases: Secondary | ICD-10-CM

## 2020-04-20 DIAGNOSIS — L0889 Other specified local infections of the skin and subcutaneous tissue: Secondary | ICD-10-CM

## 2020-04-20 LAB — CBC
HCT: 28.3 % — ABNORMAL LOW (ref 36.0–46.0)
Hemoglobin: 8.7 g/dL — ABNORMAL LOW (ref 12.0–15.0)
MCH: 24.5 pg — ABNORMAL LOW (ref 26.0–34.0)
MCHC: 30.7 g/dL (ref 30.0–36.0)
MCV: 79.7 fL — ABNORMAL LOW (ref 80.0–100.0)
Platelets: 249 10*3/uL (ref 150–400)
RBC: 3.55 MIL/uL — ABNORMAL LOW (ref 3.87–5.11)
RDW: 16.1 % — ABNORMAL HIGH (ref 11.5–15.5)
WBC: 5.3 10*3/uL (ref 4.0–10.5)
nRBC: 0 % (ref 0.0–0.2)

## 2020-04-20 MED ORDER — CEFAZOLIN SODIUM-DEXTROSE 2-4 GM/100ML-% IV SOLN
2.0000 g | Freq: Three times a day (TID) | INTRAVENOUS | Status: DC
  Administered 2020-04-20 – 2020-04-23 (×8): 2 g via INTRAVENOUS
  Filled 2020-04-20 (×11): qty 100

## 2020-04-20 NOTE — Progress Notes (Signed)
Physical Therapy Treatment Patient Details Name: Jasmine Morrow MRN: [id number] DOB: 06-11-1945 Today's Date: 04/20/2020    History of Present Illness Pt is a 75 y/o F admitted on 04/16/20 via EMS for re-evaluation of R knee & R ankle. Pt is 2.5 months s/p R knee arthrotomy, I&D, polyethylene exchange & placement of antibiotic eluding beads. Cultures were (+) for methicillin sensitive staph aureus & pt completed course of ancef & has continued with other medications. Pt continues to have serous drainage from medial lesion of R ankle which grew pseudomonas putida. Pt underwent hardware removal from R ankle with I&D & placement of antibiotic beads & R knee arthrotomy, extensive I&D, removal of R TKA implants & placement of antibiotic cement spacer on 04/17/20 by Dr. Marry Guan. Pt is now TDWB RLE in R knee immobilizer. Post-operatively pt developed chest pain & hospitalists were consulted.  PMH: asthma, GERD, OSA on CPAP, anxiety, COPD, multiple lung nodules, hx of ICU admission for GI bleed with hemorrhagic shock.    PT Comments    Pt seen for PT treatment with pt reporting ongoing nausea & PT educating pt on need to focus on breathing & task at hand. Pt is able to complete supine<>sit with min assist +2 with PT supporting at least RLE throughout. PT provides education re: need for OOB mobility to chair & technique for performing lateral scoot to drop arm recliner. Pt is able to scoot along EOB to R with support to RLE but otherwise no assistance. Pt requires several rest breaks 2/2 nausea but no emesis during session. Before initiating scoot to recliner Pt becomes fearful & declines sitting in the chair, reporting she doesn't want to get in the chair despite education re: benefits. Pt assisted back to bed & left with MD in room.     Follow Up Recommendations  Home health PT;Supervision for mobility/OOB     Equipment Recommendations   (potential need for hoyer lift)    Recommendations for Other  Services       Precautions / Restrictions Precautions Precautions: Fall Required Braces or Orthoses: Other Brace Other Brace: R hinged knee brace locked in extension Restrictions Weight Bearing Restrictions: Yes RLE Weight Bearing: Touchdown weight bearing    Mobility  Bed Mobility Overal bed mobility: Needs Assistance Bed Mobility: Supine to Sit;Sit to Supine;Rolling Rolling: Min assist   Supine to sit: Min assist;HOB elevated;+2 for physical assistance (supporting RLE & assist to upright trunk) Sit to supine: Min assist;+2 for physical assistance;HOB elevated (assistance to elevate BLE on to bed & lower trunk)        Transfers                    Ambulation/Gait                 Stairs             Wheelchair Mobility    Modified Rankin (Stroke Patients Only)       Balance Overall balance assessment: Mild deficits observed, not formally tested;Needs assistance Sitting-balance support: Feet supported;Bilateral upper extremity supported Sitting balance-Leahy Scale: Fair Sitting balance - Comments: supervision sitting EOB                                    Cognition Arousal/Alertness: Awake/alert Behavior During Therapy: Anxious Overall Cognitive Status: Within Functional Limits for tasks assessed  General Comments: extremely anxious, internally distracted. PT provide max encouragement & cuing to sustain attention to task at hand      Exercises      General Comments        Pertinent Vitals/Pain Pain Assessment: Faces Faces Pain Scale: Hurts a little bit Pain Location: RLE Pain Descriptors / Indicators: Guarding;Grimacing Pain Intervention(s): Limited activity within patient's tolerance;Monitored during session    Home Living                      Prior Function            PT Goals (current goals can now be found in the care plan section) Acute Rehab PT  Goals Patient Stated Goal: to walk and to be able to use the bathroom at will PT Goal Formulation: With patient Time For Goal Achievement: 05/02/20 Potential to Achieve Goals: Fair Progress towards PT goals: Progressing toward goals    Frequency    7X/week      PT Plan Current plan remains appropriate    Co-evaluation              AM-PAC PT "6 Clicks" Mobility   Outcome Measure  Help needed turning from your back to your side while in a flat bed without using bedrails?: A Little Help needed moving from lying on your back to sitting on the side of a flat bed without using bedrails?: A Lot Help needed moving to and from a bed to a chair (including a wheelchair)?: Total Help needed standing up from a chair using your arms (e.g., wheelchair or bedside chair)?: Total Help needed to walk in hospital room?: Total Help needed climbing 3-5 steps with a railing? : Total 6 Click Score: 9    End of Session Equipment Utilized During Treatment:  (R knee brace locked in extension throughout) Activity Tolerance:  (limited by anxiety & nausea) Patient left: in bed;with call bell/phone within reach;with bed alarm set;with SCD's reapplied (SCD on L foot, polar care donned R knee, MD in room) Nurse Communication:  (IV complete) PT Visit Diagnosis: Muscle weakness (generalized) (M62.81);Difficulty in walking, not elsewhere classified (R26.2);Other abnormalities of gait and mobility (R26.89)     Time: 4008-6761 PT Time Calculation (min) (ACUTE ONLY): 18 min  Charges:  $Therapeutic Activity: 8-22 mins                     Jasmine Morrow, PT, DPT 04/20/20, 9:47 AM    [name] 04/20/2020, 9:44 AM

## 2020-04-20 NOTE — Progress Notes (Signed)
Physical Therapy Treatment Patient Details Name: Jasmine Morrow MRN: [id number] DOB: 1945/06/07 Today's Date: 04/20/2020    History of Present Illness Pt is a 75 y/o F admitted on 04/16/20 via EMS for re-evaluation of R knee & R ankle. Pt is 2.5 months s/p R knee arthrotomy, I&D, polyethylene exchange & placement of antibiotic eluding beads. Cultures were (+) for methicillin sensitive staph aureus & pt completed course of ancef & has continued with other medications. Pt continues to have serous drainage from medial lesion of R ankle which grew pseudomonas putida. Pt underwent hardware removal from R ankle with I&D & placement of antibiotic beads & R knee arthrotomy, extensive I&D, removal of R TKA implants & placement of antibiotic cement spacer on 04/17/20 by Dr. Marry Guan. Pt is now TDWB RLE in R knee immobilizer. Post-operatively pt developed chest pain & hospitalists were consulted.  PMH: asthma, GERD, OSA on CPAP, anxiety, COPD, multiple lung nodules, hx of ICU admission for GI bleed with hemorrhagic shock.    PT Comments    Pt requesting to speak with PT with pt received in bed, tearful throughout session. Pt voices concerns re: d/c home with HHPT and notes her husband has difficulty moving around & caring for her but pt very hesitant to d/c to STR. PT educated pt on STR vs HHPT, educated her on potential use of hoyer lift transfers bed<>w/c or BSC to decrease physical burden on her husband until she progresses with transfers. Pt also voices concerns re: her/her husband not eating well at home due to the fact that caring for her & himself is a lot. Offered to provide pt with list of resources & pt grateful. PT offers chaplain services but pt declines at this time, reporting her pastor came to see her yesterday. Pt appreciative of PT encouragement/education. Pt left in care of PA.     Follow Up Recommendations  Home health PT;Supervision for mobility/OOB     Equipment Recommendations   (hoyer  lift, non emergent EMS transport home)    Recommendations for Other Services       Precautions / Restrictions      Mobility  Bed Mobility                    Transfers                    Ambulation/Gait                 Stairs             Wheelchair Mobility    Modified Rankin (Stroke Patients Only)       Balance                                            Cognition                                              Exercises      General Comments        Pertinent Vitals/Pain      Home Living                      Prior Function  PT Goals (current goals can now be found in the care plan section) Acute Rehab PT Goals Patient Stated Goal: to walk and to be able to use the bathroom at will PT Goal Formulation: With patient Time For Goal Achievement: 05/02/20 Potential to Achieve Goals: Fair Progress towards PT goals: PT to reassess next treatment    Frequency    7X/week      PT Plan Current plan remains appropriate    Co-evaluation              AM-PAC PT "6 Clicks" Mobility   Outcome Measure  Help needed turning from your back to your side while in a flat bed without using bedrails?: A Little Help needed moving from lying on your back to sitting on the side of a flat bed without using bedrails?: A Lot Help needed moving to and from a bed to a chair (including a wheelchair)?: Total Help needed standing up from a chair using your arms (e.g., wheelchair or bedside chair)?: Total Help needed to walk in hospital room?: Total Help needed climbing 3-5 steps with a railing? : Total 6 Click Score: 9    End of Session     Patient left: in bed;with call bell/phone within reach;with bed alarm set (in care of PA, R knee brace & polar care donned)   PT Visit Diagnosis: Muscle weakness (generalized) (M62.81);Difficulty in walking, not elsewhere classified (R26.2);Other  abnormalities of gait and mobility (R26.89)     Time: 0100-7121 PT Time Calculation (min) (ACUTE ONLY): 19 min  Charges:  $Therapeutic Activity: 8-22 mins                     Lavone Nian, PT, DPT 04/20/20, 3:38 PM    [name] 04/20/2020, 3:35 PM

## 2020-04-20 NOTE — Progress Notes (Signed)
PROGRESS NOTE    Jasmine Morrow   ZOX:[id number]  DOB: November 08, 1945  DOA: 04/17/2020 PCP: Rusty Aus, MD   Brief Narrative:  Jasmine Morrow is an 75 y.o. female with a history of asthma, GERD, OSA on CPAP, anxiety, COPD, multiple lung nodules, as well as history of ICU admission for GI bleed with hemorrhagic shock who is being seen in consultation for chest pain which she developed postoperatively.     Subjective: Still has nausea but able to eat more since making Zofran QID  Assessment & Plan:   Principal Problem:   Chest pain - troponin I negative - 2 D ECHO reassuring w/o wall motion abnormalities or pericardial effusion - likely GI related, cont PPI  Active Problems:  Nausea/ poor oral intake - she believes it is due to Cipro- started Zofran QID which is helping a little  Diarrhea - abdomen only mildly tender and not distended- this is not C diff - d/c C diff testing - reduce Miralax and Senna to PRN rather than daily.  - DC Reglan (was is TID routine) which causes diarrhea  AKI - improving - in setting of infection, diarrhea and poor oral intake - Cr 0.6-0.7>>  Cr 1.09>> 0.80 - follow urine output  Hypotension -  holding parameters on Coreg  Microcytic anemia- Iron and B12 deficinecy -  Iron              Iron    UIBC        207      UIBC   TIBC        230      TIBC   Saturation Ratios        10      Saturation Ratios   Ferritin        106      Ferritin   Folate        6.0      Folate   OTHER CHEM  Vitamin B12       171   - Ferritin is falsely elevated as an acute phase reactant as a result of the septic arthritis  - ordered Feraheme and s/c B12 injections -3/20  Hb 7.2 -given 1 U PRBC- now 8.7     Extrinsic asthma/ COPD - stable- PRN Nebs ordered  Hyperlipidemia - LDL 107    Multiple lung nodules - needs outpt follow up     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    Anxiety - cont Lexapro      Time spent in minutes:  30 DVT prophylaxis: per ortho Code Status: full code Family Communication: none- patient oriented Antimicrobials:  Anti-infectives (From admission, onward)   Start     Dose/Rate Route Frequency Ordered Stop   04/17/20 2345  ciprofloxacin (CIPRO) tablet 500 mg        500 mg Oral 2 times daily 04/17/20 2245     04/17/20 2345  rifampin (RIFADIN) capsule 300 mg        300 mg Oral 2 times daily 04/17/20 2245     04/17/20 2345  ceFAZolin (ANCEF) IVPB 2g/100 mL premix        2 g 200 mL/hr over 30 Minutes Intravenous Every 6 hours 04/17/20 2245 04/18/20 0555   04/17/20 1842  gentamicin (GARAMYCIN) injection  Status:  Discontinued          As needed 04/17/20 1843 04/17/20 1918   04/17/20 1730  vancomycin (VANCOCIN) powder  Status:  Discontinued          As needed 04/17/20 1730 04/17/20 1918   04/17/20 1728  Tobramycin Sulfate POWD  Status:  Discontinued          As needed 04/17/20 1730 04/17/20 1918   04/17/20 0930  ceFAZolin (ANCEF) 2-4 GM/100ML-% IVPB       Note to Pharmacy: Ronnell Freshwater   : cabinet override      04/17/20 0930 04/17/20 1204   04/17/20 0600  ceFAZolin (ANCEF) IVPB 2g/100 mL premix        2 g 200 mL/hr over 30 Minutes Intravenous On call to O.R. 04/17/20 0038 04/17/20 1607       Objective: Vitals:   04/19/20 2345 04/20/20 0429 04/20/20 0730 04/20/20 1131  BP: (!) 103/52 133/62 133/68 (!) 145/69  Pulse: 83 86 77 71  Resp: 18 20 16 18   Temp: 97.7 F (36.5 C) 97.8 F (36.6 C) 97.9 F (36.6 C) 97.7 F (36.5 C)  TempSrc: Oral Oral  Oral  SpO2: 96% 97% 100% 99%  Weight:      Height:        Intake/Output Summary (Last 24 hours) at 04/20/2020 1206 Last data filed at 04/19/2020 1845 Gross per 24 hour  Intake 395 ml  Output --  Net 395 ml   Filed Weights   04/17/20 0903 04/18/20 0016  Weight: 86.2 kg 83.2 kg    Examination: General exam: Appears comfortable  HEENT: PERRLA, oral mucosa moist, no sclera icterus or thrush Respiratory system: Clear to  auscultation. Respiratory effort normal. Cardiovascular system: S1 & S2 heard, regular rate and rhythm Gastrointestinal system: Abdomen soft, non-tender, nondistended. Normal bowel sounds   Central nervous system: Alert and oriented. No focal neurological deficits. Extremities: No cyanosis, clubbing left knee in immobilizer Skin: No rashes or ulcers Psychiatry:  Mood & affect appropriate.    Data Reviewed: I have personally reviewed following labs and imaging studies  CBC: Recent Labs  Lab 04/17/20 2248 04/18/20 0436 04/19/20 0348 04/19/20 2004 04/20/20 0547  WBC 13.8* 8.8 5.7  --  5.3  NEUTROABS 11.8*  --   --   --   --   HGB 9.5* 8.9* 7.2* 8.3* 8.7*  HCT 31.3* 28.7* 23.1*  --  28.3*  MCV 78.3* 77.8* 77.5*  --  79.7*  PLT 370 302 258  --  361   Basic Metabolic Panel: Recent Labs  Lab 04/17/20 2248 04/18/20 0436 04/19/20 0348  NA 138 137 139  K 4.9 4.9 3.9  CL 105 105 110  CO2 24 25 25   GLUCOSE 142* 117* 99  BUN 16 17 14   CREATININE 1.06* 1.09* 0.80  CALCIUM 9.7 9.2 8.7*   GFR: Estimated Creatinine Clearance: 70.8 mL/min (by C-G formula based on SCr of 0.8 mg/dL). Liver Function Tests: Recent Labs  Lab 04/17/20 2248  AST 37  ALT 26  ALKPHOS 91  BILITOT 0.6  PROT 6.2*  ALBUMIN 2.9*   No results for input(s): LIPASE, AMYLASE in the last 168 hours. No results for input(s): AMMONIA in the last 168 hours. Coagulation Profile: No results for input(s): INR, PROTIME in the last 168 hours. Cardiac Enzymes: No results for input(s): CKTOTAL, CKMB, CKMBINDEX, TROPONINI in the last 168 hours. BNP (last 3 results) No results for input(s): PROBNP in the last 8760 hours. HbA1C: No results for input(s): HGBA1C in the last 72 hours. CBG: No results for input(s): GLUCAP in the last 168 hours. Lipid Profile: Recent Labs  04/17/20 2248  CHOL 175  HDL 41  LDLCALC 107*  TRIG 137  CHOLHDL 4.3   Thyroid Function Tests: Recent Labs    04/17/20 2248  TSH 3.117    Anemia Panel: Recent Labs    04/18/20 0436 04/18/20 0802  VITAMINB12  --  171*  FOLATE 6.0  --   FERRITIN 106  --   TIBC 230*  --   IRON 23*  --   RETICCTPCT 2.0  --    Urine analysis:    Component Value Date/Time   COLORURINE AMBER (A) 04/18/2020 1235   APPEARANCEUR CLOUDY (A) 04/18/2020 1235   LABSPEC 1.023 04/18/2020 1235   PHURINE 5.0 04/18/2020 1235   GLUCOSEU NEGATIVE 04/18/2020 1235   HGBUR MODERATE (A) 04/18/2020 1235   BILIRUBINUR NEGATIVE 04/18/2020 1235   KETONESUR NEGATIVE 04/18/2020 1235   PROTEINUR 30 (A) 04/18/2020 1235   NITRITE NEGATIVE 04/18/2020 1235   LEUKOCYTESUR LARGE (A) 04/18/2020 1235   Sepsis Labs: @LABRCNTIP (procalcitonin:4,lacticidven:4) ) Recent Results (from the past 240 hour(s))  SARS Coronavirus 2 by RT PCR (hospital order, performed in [place] hospital lab) Nasopharyngeal Nasopharyngeal Swab     Status: None   Collection Time: 04/17/20  9:01 AM   Specimen: Nasopharyngeal Swab  Result Value Ref Range Status   SARS Coronavirus 2 NEGATIVE NEGATIVE Final    Comment: (NOTE) SARS-CoV-2 target nucleic acids are NOT DETECTED.  The SARS-CoV-2 RNA is generally detectable in upper and lower respiratory specimens during the acute phase of infection. The lowest concentration of SARS-CoV-2 viral copies this assay can detect is 250 copies / mL. A negative result does not preclude SARS-CoV-2 infection and should not be used as the sole basis for treatment or other patient management decisions.  A negative result may occur with improper specimen collection / handling, submission of specimen other than nasopharyngeal swab, presence of viral mutation(s) within the areas targeted by this assay, and inadequate number of viral copies (<250 copies / mL). A negative result must be combined with clinical observations, patient history, and epidemiological information.  Fact Sheet for Patients:   StrictlyIdeas.no  Fact Sheet  for Healthcare Providers: BankingDealers.co.za  This test is not yet approved or  cleared by the [place] FDA and has been authorized for detection and/or diagnosis of SARS-CoV-2 by FDA under an Emergency Use Authorization (EUA).  This EUA will remain in effect (meaning this test can be used) for the duration of the COVID-19 declaration under Section 564(b)(1) of the Act, 21 U.S.C. section 360bbb-3(b)(1), unless the authorization is terminated or revoked sooner.  Performed at [facility], [address], [place], [place] [address]   Surgical pcr screen     Status: None   Collection Time: 04/17/20  9:38 AM   Specimen: Nasal Mucosa; Nasal Swab  Result Value Ref Range Status   MRSA, PCR NEGATIVE NEGATIVE Final   Staphylococcus aureus NEGATIVE NEGATIVE Final    Comment: (NOTE) The Xpert SA Assay (FDA approved for NASAL specimens in patients 26 years of age and older), is one component of a comprehensive surveillance program. It is not intended to diagnose infection nor to guide or monitor treatment. Performed at [facility], [place], [place] [address]   Aerobic/Anaerobic Culture w Gram Stain (surgical/deep wound)     Status: None (Preliminary result)   Collection Time: 04/17/20 12:23 PM   Specimen: Ankle  Result Value Ref Range Status   Specimen Description ANKLE  Final   Special Requests POSTERIOR LATERAL  RIGHT ANKLE PLATE  Final   Gram Stain   Final    FEW WBC PRESENT, PREDOMINANTLY PMN NO ORGANISMS SEEN    Culture   Final    NO GROWTH 3 DAYS NO ANAEROBES ISOLATED; CULTURE IN PROGRESS FOR 5 DAYS Performed at [facility], [address] [place], [place] [address]    Report Status PENDING  Incomplete  Gram stain     Status: None   Collection Time: 04/17/20  1:00 PM   Specimen: PATH Other; Tissue  Result Value Ref Range Status   Specimen Description ANKLE POSTERIOR LATERAL ANKLE RIGHT PLATE  Final    Special Requests NONE  Final   Gram Stain   Final    NO ORGANISMS SEEN FEW WBC SEEN Performed at [facility], [address] [place], [place] [address]    Report Status 04/17/2020 FINAL  Final  Gram stain     Status: None   Collection Time: 04/17/20  1:16 PM   Specimen: PATH Other; Tissue  Result Value Ref Range Status   Specimen Description ANKLE  Final   Special Requests NONE  Final   Gram Stain   Final    NO ORGANISMS SEEN FEW WBC SEEN Performed at [facility], [address], [place], [place] [address]    Report Status 04/17/2020 FINAL  Final  Aerobic/Anaerobic Culture w Gram Stain (surgical/deep wound)     Status: None (Preliminary result)   Collection Time: 04/17/20  1:16 PM   Specimen: PATH Other; Tissue  Result Value Ref Range Status   Specimen Description   Final    ANKLE Performed at [facility], [address], [place], [place] [address]    Special Requests   Final    NONE Performed at [facility], [address] [place], [place] [address]    Gram Stain   Final    RARE WBC PRESENT, PREDOMINANTLY PMN NO ORGANISMS SEEN    Culture   Final    NO GROWTH 3 DAYS NO ANAEROBES ISOLATED; CULTURE IN PROGRESS FOR 5 DAYS Performed at [facility], [place] [address], [place], [place] [address]    Report Status PENDING  Incomplete  Gram stain     Status: None   Collection Time: 04/17/20  1:25 PM   Specimen: PATH Other; Tissue  Result Value Ref Range Status   Specimen Description WOUND RIGHT DISTAL FIBULA  Final   Special Requests NONE  Final   Gram Stain   Final    NO ORGANISMS SEEN FEW WBC SEEN Performed at [facility], [address], [place], [place] [address]    Report Status 04/17/2020 FINAL  Final  Aerobic/Anaerobic Culture w Gram Stain (surgical/deep wound)     Status: None (Preliminary result)   Collection Time: 04/17/20  1:25 PM   Specimen: PATH Other; Tissue  Result Value Ref Range Status    Specimen Description   Final    WOUND RIGHT DISTAL FIBULA Performed at [facility], [address], [place],  [address]    Special Requests   Final    NONE Performed at [facility], [address] [place],  [address]    Gram Stain   Final    FEW WBC PRESENT, PREDOMINANTLY PMN NO ORGANISMS SEEN    Culture   Final    NO GROWTH 3 DAYS NO ANAEROBES ISOLATED; CULTURE IN PROGRESS FOR 5 DAYS Performed at [facility]  [address] [place], [place] [address]    Report Status PENDING  Incomplete  Gram stain     Status: None   Collection Time: 04/17/20  2:24 PM   Specimen: PATH Other; Tissue  Result Value Ref Range Status   Specimen Description WOUND  Final   Special Requests MEDIAL RIGHT SCREW  Final   Gram Stain   Final    NO ORGANISMS SEEN FEW WBC SEEN RESULT CALLED TO, READ BACK BY AND VERIFIED WITH: ADRIENNE ALLRED AT 1478 ON 04/17/20 BY SS Performed at [facility], [address], [place], [place] [address]    Report Status 04/17/2020 FINAL  Final  Aerobic/Anaerobic Culture w Gram Stain (surgical/deep wound)     Status: None (Preliminary result)   Collection Time: 04/17/20  2:24 PM   Specimen: PATH Other; Tissue  Result Value Ref Range Status   Specimen Description   Final    WOUND Performed at [facility], [address], [place], [place] [address]    Special Requests   Final    MEDIAL SCREW RIGHT Performed at [facility], [address] [place], [place] [address]    Gram Stain   Final    RARE WBC PRESENT,BOTH PMN AND MONONUCLEAR NO ORGANISMS SEEN    Culture   Final    NO GROWTH 2 DAYS Performed at [facility], [place] [address] [place], [place] [address]    Report Status PENDING  Incomplete  Gram stain     Status: None   Collection Time: 04/17/20  2:29 PM   Specimen: PATH Other; Tissue  Result Value Ref Range Status   Specimen Description WOUND  Final   Special Requests MEDIAL RIGHT  ANKLE  Final   Gram Stain   Final    NO ORGANISMS SEEN RARE WBC SEEN RESULT CALLED TO, READ BACK BY AND VERIFIED WITH: ADRIENNE ALLRED AT 2841 ON 04/17/20 BY SS Performed at [facility], [address], [place], [place] [address]    Report Status 04/17/2020 FINAL  Final  Aerobic/Anaerobic Culture w Gram Stain (surgical/deep wound)     Status: None (Preliminary result)   Collection Time: 04/17/20  2:29 PM   Specimen: PATH Other; Tissue  Result Value Ref Range Status   Specimen Description   Final    WOUND Performed at [facility], [address], [place], [place] [address]    Special Requests MEDIAL RIGHT ANKLE  Final   Gram Stain NO WBC SEEN NO ORGANISMS SEEN   Final   Culture   Final    NO GROWTH 2 DAYS Performed at [facility], [place] [address] [place], [place] [address]    Report Status PENDING  Incomplete  Gram stain     Status: None   Collection Time: 04/17/20  3:51 PM   Specimen: PATH Other; Tissue  Result Value Ref Range Status   Specimen Description TISSUE  Final   Special Requests RIGHT KNEE ASPIRATION  Final   Gram Stain   Final    NO ORGANISMS SEEN MANY WBC SEEN Performed at [facility], [address], [place],  [address]    Report Status 04/17/2020 FINAL  Final  Aerobic/Anaerobic Culture w Gram Stain (surgical/deep wound)     Status: None (Preliminary result)   Collection Time: 04/17/20  3:51 PM   Specimen: PATH Other; Tissue  Result Value Ref Range Status   Specimen Description   Final    TISSUE Performed at [facility], [address], [place], [place] [address]    Special Requests   Final     RIGHT KNEE ASPIRATION Performed at [facility], [place], [place] [address]    Gram Stain   Final    MODERATE WBC PRESENT,BOTH PMN AND MONONUCLEAR NO ORGANISMS SEEN    Culture   Final    NO GROWTH 2 DAYS Performed at [facility], [place] [address], [place], [place]  [address]    Report Status PENDING  Incomplete  Gram stain     Status: None   Collection Time: 04/17/20  3:58 PM   Specimen: PATH Other; Tissue  Result Value Ref Range Status   Specimen Description KNEE KNEE  Final   Special Requests NONE  Final   Gram Stain   Final    NO ORGANISMS SEEN FEW WBC SEEN Performed at [facility], [address] [place], [place] [address]    Report Status 04/17/2020 FINAL  Final  Gram stain     Status: None   Collection Time: 04/17/20  3:58 PM   Specimen: PATH Other; Tissue  Result Value Ref Range Status   Specimen Description WOUND  Final   Special Requests NONE  Final   Gram Stain   Final    NO ORGANISMS SEEN FEW WBC SEEN Performed at [facility], [address], [place], [place] [address]    Report Status 04/17/2020 FINAL  Final  Aerobic/Anaerobic Culture w Gram Stain (surgical/deep wound)     Status: None (Preliminary result)   Collection Time: 04/17/20  3:58 PM   Specimen: PATH Other; Tissue  Result Value Ref Range Status   Specimen Description   Final    WOUND Performed at [facility], [address] [place], [place] [address]    Special Requests   Final    ANTERIOR KNEE Performed at [facility], [address] [place], [place] [address]    Gram Stain   Final    RARE WBC PRESENT, PREDOMINANTLY MONONUCLEAR NO ORGANISMS SEEN    Culture   Final    NO GROWTH 2 DAYS Performed at [facility], [place] [address] [place], [place] [address]    Report Status PENDING  Incomplete  Aerobic/Anaerobic Culture w Gram Stain (surgical/deep wound)     Status: None (Preliminary result)   Collection Time: 04/17/20  3:58 PM   Specimen: PATH Other; Tissue  Result Value Ref Range Status   Specimen Description   Final    WOUND Performed at [facility], [address] [place], [place] [address]    Special Requests POSTERIOR RECESS RIGHT KNEE  Final   Gram Stain   Final    RARE WBC PRESENT,  PREDOMINANTLY PMN NO ORGANISMS SEEN    Culture   Final    NO GROWTH 2 DAYS Performed at [facility], [place] [address] [place], [place] [address]    Report Status PENDING  Incomplete  Gram stain     Status: None   Collection Time: 04/17/20  4:24 PM   Specimen: PATH Other; Tissue  Result Value Ref Range Status   Specimen Description TISSUE  Final   Special Requests RIGHT FEMUR UNDER IMPLANT  Final   Gram Stain   Final    NO ORGANISMS SEEN FEW WBC SEEN Performed at [facility], [address] [place], [place] [address]    Report Status 04/17/2020 FINAL  Final  Aerobic/Anaerobic  Culture w Gram Stain (surgical/deep wound)     Status: None (Preliminary result)   Collection Time: 04/17/20  4:24 PM   Specimen: PATH Other; Tissue  Result Value Ref Range Status   Specimen Description   Final    TISSUE Performed at [facility], [address] [place], [place] [address]    Special Requests   Final     RIGHT FEMUR UNDER IMPLANT Performed at [facility], [place], [place] [address]    Gram Stain   Final    RARE WBC PRESENT,BOTH PMN AND MONONUCLEAR NO ORGANISMS SEEN    Culture   Final    NO GROWTH 2 DAYS Performed at [facility], [place] [address], [place], [place] [address]    Report Status PENDING  Incomplete  Gram stain     Status: None   Collection Time: 04/17/20  4:56 PM   Specimen: PATH Other; Tissue  Result Value Ref Range Status   Specimen Description TISSUE  Final   Special Requests TIBIAL CANAL  Final   Gram Stain   Final    NO ORGANISMS SEEN MANY WBC SEEN Performed at [facility], [address] [place], [place] [address]    Report Status 04/17/2020 FINAL  Final  Aerobic/Anaerobic Culture w Gram Stain (surgical/deep wound)     Status: None (Preliminary result)   Collection Time: 04/17/20  4:56 PM   Specimen: PATH Other; Tissue  Result Value Ref Range Status   Specimen Description   Final     TISSUE Performed at [facility], [address], [place], [place] [address]    Special Requests   Final    TIBIAL CANAL Performed at [facility], [address], [place], [place] [address]    Gram Stain   Final    ABUNDANT WBC PRESENT,BOTH PMN AND MONONUCLEAR NO ORGANISMS SEEN    Culture   Final    NO GROWTH 2 DAYS Performed at [facility], [place] [address], [place], [place] [address]    Report Status PENDING  Incomplete  Culture, Urine     Status: Abnormal   Collection Time: 04/18/20 12:35 PM   Specimen: Urine, Catheterized  Result Value Ref Range Status   Specimen Description   Final    URINE, CATHETERIZED Performed at [facility], [address], [place], [place] [address]    Special Requests   Final    NONE Performed at [facility], [address] [place], [place] [address]    Culture 10,000 COLONIES/mL YEAST (A)  Final   Report Status 04/19/2020 FINAL  Final         Radiology Studies: No results found.    Scheduled Meds: . acidophilus  1 capsule Oral BID  . carvedilol  3.125 mg Oral BID  . Chlorhexidine Gluconate Cloth  6 each Topical Daily  . ciprofloxacin  500 mg Oral BID  . cyanocobalamin  1,000 mcg Subcutaneous Q1200  . enoxaparin (LOVENOX) injection  30 mg Subcutaneous Q12H  . escitalopram  10 mg Oral Daily  . ferrous sulfate  325 mg Oral BID WC  . loratadine  10 mg Oral Daily  . multivitamin-lutein  1 capsule Oral BID  . ondansetron (ZOFRAN) IV  4 mg Intravenous Q6H  . pantoprazole  40 mg Oral BID  . polyethylene glycol  17 g Oral Daily  . Ensure Max Protein  11 oz Oral BID  . rifampin  300 mg  Oral BID   Continuous Infusions: . sodium chloride 100 mL/hr at 04/19/20 2236  . promethazine (PHENERGAN) injection       LOS: 3 days      Debbe Odea, MD [facility] Pager: [email or web address] 04/20/2020, 12:06 PM

## 2020-04-20 NOTE — Progress Notes (Addendum)
  Subjective: 3 Days Post-Op Procedure(s) (LRB): HARDWARE REMOVAL FROM RIGHT ANKLE AND RIGHT KNEE; IMPLANT OF CEMENT SPACERS IN RIGHT KNEE (Right) Patient reports pain as well-controlled.   Patient continues to have issues with nausea, which states is worse in the morning and improves by late afternoon. Negative for chest pain and shortness of breath Fever: no Gastrointestinal: negative for nausea at this time, but continues to deal with this daily.  Patient has had a bowel movement.  Objective: Vital signs in last 24 hours: Temp:  [97.7 F (36.5 C)-98.2 F (36.8 C)] 97.7 F (36.5 C) (03/21 1626) Pulse Rate:  [71-86] 86 (03/21 1626) Resp:  [16-20] 17 (03/21 1626) BP: (103-145)/(45-69) 145/65 (03/21 1626) SpO2:  [96 %-100 %] 100 % (03/21 1626)  Intake/Output from previous day:  Intake/Output Summary (Last 24 hours) at 04/20/2020 1633 Last data filed at 04/20/2020 1345 Gross per 24 hour  Intake 635 ml  Output --  Net 635 ml    Intake/Output this shift: Total I/O In: 240 [P.O.:240] Out: -   Labs: Recent Labs    04/17/20 2248 04/18/20 0436 04/19/20 0348 04/19/20 2004 04/20/20 0547  HGB 9.5* 8.9* 7.2* 8.3* 8.7*   Recent Labs    04/19/20 0348 04/20/20 0547  WBC 5.7 5.3  RBC 2.98* 3.55*  HCT 23.1* 28.3*  PLT 258 249   Recent Labs    04/18/20 0436 04/19/20 0348  NA 137 139  K 4.9 3.9  CL 105 110  CO2 25 25  BUN 17 14  CREATININE 1.09* 0.80  GLUCOSE 117* 99  CALCIUM 9.2 8.7*   No results for input(s): LABPT, INR in the last 72 hours.   EXAM General - Patient is Alert, Appropriate and Oriented Extremity - Neurovascular intact Dorsiflexion/Plantar flexion intact Compartment soft Dressing/Incision - Dressing intact over the ankle and knee. mild serosanguinous drainage noted from medial ankle wound, otherwise no drainage from knee incision or lateral ankle. No erythema noted. Motor Function - intact, moving foot and toes well on exam.  Cardiovascular-  Regular rate and rhythm, no murmurs/rubs/gallops Respiratory- Lungs clear to auscultation bilaterally Gastrointestinal- soft, nontender and active bowel sounds   Assessment/Plan: 3 Days Post-Op Procedure(s) (LRB): HARDWARE REMOVAL FROM RIGHT ANKLE AND RIGHT KNEE; IMPLANT OF CEMENT SPACERS IN RIGHT KNEE (Right) Principal Problem:   Chest pain Active Problems:   Extrinsic asthma   OSA on CPAP   Multiple lung nodules   Pressure injury of skin   COPD (chronic obstructive pulmonary disease) (HCC)   GERD (gastroesophageal reflux disease)   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HCC)   Anxiety   History of GI diverticular bleed  Estimated body mass index is 27.9 kg/m as calculated from the following:   Height as of this encounter: 5\' 8"  (1.727 m).   Weight as of this encounter: 83.2 kg. Advance diet Up with therapy  persistent nausea -will discuss possible GI referral with attending -patient already receiving scheduled Zofran  Review of cultures shows no growth at this time.  Did see patient briefly with infectious disease. Pictures taken of ankle by infectious disease. Stressed importance of working with physical therapy to transfer to chair to prevent worsening of sacral ulcer.  DVT Prophylaxis - Lovenox , foot pumps Touchdown Weight-Bearing to right leg  Cassell Smiles, PA-C [facility] Orthopaedic Surgery 04/20/2020, 4:33 PM

## 2020-04-20 NOTE — Consult Note (Signed)
NAME: Jasmine Morrow  DOB: 02-May-1945  MRN: [id number]  Date/Time: 04/20/2020 6:22 PM  REQUESTING PROVIDER: Dr. 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  Patient has a complicated infectious history In January 2020 when she had a fall and ankle fracture for which she had an ORIF on 02/08/2019.  This got infected with MSSA bacteremia.  She was treated with antibiotics until 03/29/2019 by the Ortho team.  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  On 11/12/2019 some part of the right ankle hardware was removed by Dr. Rudene Christians.  The culture was rather staph aureus.  She was again given oral antibiotics for that.  She continued to have nonhealing wound and drainage in spite of 2 rounds of antibiotics.  She was also followed at the wound clinic then.  She then got admitted to the hospital on 01/22/2020 with nonhealing wound.  There was also A large right knee effusion.  She was diagnosed with right periprosthetic knee infection.  She underwent right knee arthrotomy, irrigation debridement and polyexchange on 01/24/2020.  The cultures were all methicillin sensitive staph aureus.  Patient was placed on IV cefazolin and p.o. rifampin which she took for 6 weeks.  After that she was placed on  ciprofloxacin along with rifampin. But because the right ankle and the right knee wound was not healing she was taken by Dr. Marry Guan for hardware removal on 04/17/2020.  Prior to that she was assessed by vascular as outpatient and no circulatory  issue was noted.  All the hardware has been removed and she has an antibiotic spacer in the knee.  Multiple cultures were sent I am consulted for further antibiotic management.  Past Medical History:  Diagnosis Date  . Anemia   . Anxiety   . Asthma    seasonal   . GERD (gastroesophageal reflux disease)   . Hypertension   . PONV (postoperative nausea and vomiting)   . Seasonal allergies   . Sleep apnea    does not uses CPAP machine anymore     Past Surgical History:  Procedure Laterality Date  . APPENDECTOMY    . CESAREAN SECTION    . CHOLECYSTECTOMY    . DILATION AND CURETTAGE OF UTERUS    . ESOPHAGOGASTRODUODENOSCOPY N/A 02/22/2019   Procedure: ESOPHAGOGASTRODUODENOSCOPY (EGD);  Surgeon: Toledo, Benay Pike, MD;  Location: [facility];  Service: Gastroenterology;  Laterality: N/A;  . FLEXIBLE SIGMOIDOSCOPY N/A 02/22/2019   Procedure: FLEXIBLE SIGMOIDOSCOPY;  Surgeon: Toledo, Benay Pike, MD;  Location: [facility];  Service: Gastroenterology;  Laterality: N/A;  . HARDWARE REMOVAL Right 11/12/2019   Procedure: Right ankle hardware removal;  Surgeon: Hessie Knows, MD;  Location: [facility];  Service: Orthopedics;  Laterality: Right;  . HARDWARE REMOVAL Right 04/17/2020   Procedure: HARDWARE REMOVAL FROM RIGHT ANKLE AND RIGHT KNEE; IMPLANT OF CEMENT SPACERS IN RIGHT KNEE;  Surgeon: Dereck Leep, MD;  Location: [facility];  Service: Orthopedics;  Laterality: Right;  . JOINT REPLACEMENT    . ORIF ANKLE FRACTURE Right 03/14/2019   Procedure: OPEN REDUCTION INTERNAL FIXATION (  ORIF) ANKLE FRACTURE, MEDIAL MALLEOLUS;  Surgeon: Hessie Knows, MD;  Location: [facility];  Service: Orthopedics;  Laterality: Right;  . ORIF ANKLE FRACTURE Right 02/08/2019   Procedure: OPEN REDUCTION INTERNAL FIXATION (ORIF) ANKLE FRACTURE, post malleolus;  Surgeon: Hessie Knows, MD;  Location: [facility];  Service: Orthopedics;  Laterality: Right;  . REPLACEMENT TOTAL KNEE Bilateral 2008   2009  . SCAR DEBRIDEMENT OF  TOTAL KNEE  01/2020  . SYNDESMOSIS REPAIR Right 02/08/2019   Procedure: SYNDESMOSIS REPAIR;  Surgeon: Hessie Knows, MD;  Location: [facility];  Service: Orthopedics;  Laterality: Right;  . TEE WITHOUT CARDIOVERSION N/A 02/26/2019   Procedure: TRANSESOPHAGEAL ECHOCARDIOGRAM (TEE);  Surgeon: Corey Skains, MD;  Location: [facility];  Service: Cardiovascular;  Laterality: N/A;  . TOTAL KNEE REVISION WITH SCAR DEBRIDEMENT/PATELLA REVISION WITH POLY EXCHANGE Right 01/24/2020   Procedure: TOTAL KNEE REVISION WITH SCAR DEBRIDEMENT/PATELLA REVISION WITH POLY EXCHANGE;  Surgeon: Dereck Leep, MD;  Location: [facility];  Service: Orthopedics;  Laterality: Right;  . 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 Marital status: Married    Spouse name: Not on file  . Number of children: Not on file  . Years of education: Not on file  . Highest education level: Not on file  Occupational History  . Not on file  Tobacco Use  . Smoking status: Former Smoker    Packs/day: 2.00    Years: 30.00    Pack years: 60.00    Types: Cigarettes    Quit date: 10/02/1990    Years since quitting: 29.5  . Smokeless tobacco: Never Used  Vaping Use  . Vaping Use: Never used  Substance and Sexual Activity  . Alcohol use: No    Alcohol/week: 0.0 standard drinks  . Drug use: No  . Sexual activity: Not on file  Other Topics Concern  . Not on file  Social History Narrative  . Not on file   Social Determinants of Health   Financial Resource Strain: Not on file  Food Insecurity: Not on file  Transportation Needs: Not on file  Physical Activity: Not on file  Stress: Not on file  Social Connections: Not on file  Intimate Partner Violence: Not on file    Family History  Problem Relation Age of Onset  . Bone cancer Mother   . COPD Father    Allergies  Allergen Reactions  . Celecoxib Nausea Only  . Doxycycline     GI Upset   . Hydrocodone-Acetaminophen  Nausea And Vomiting  . Meloxicam Nausea Only  . Morphine Sulfate Er Beads Itching  . Naproxen Swelling  . Oxycodone Other (See Comments)    disorientation  . Rofecoxib Nausea Only    (vioxx)  . Singulair [Montelukast] Swelling    Gland swelling  . Sulfa Antibiotics Other (See Comments)    Reaction: unknown  . Tape Dermatitis  . Nickel Rash   I? Current Facility-Administered Medications  Medication Dose Route Frequency Provider Last Rate Last Admin  . 0.9 %  sodium chloride infusion   Intravenous Continuous Dereck Leep, MD 100 mL/hr at 04/19/20 2236 New Bag at 04/19/20 2236  . acetaminophen (TYLENOL) tablet 650 mg  650 mg Oral Q4H PRN Athena Masse, MD      . acidophilus (RISAQUAD) capsule 1 capsule  1 capsule Oral BID Hooten, Laurice Record, MD   1 capsule at 04/20/20 0945  . ALPRAZolam Duanne Moron) tablet  0.25 mg  0.25 mg Oral BID PRN Athena Masse, MD      . alum & mag hydroxide-simeth (MAALOX/MYLANTA) 200-200-20 MG/5ML suspension 30 mL  30 mL Oral Q4H PRN Dereck Leep, MD   30 mL at 04/19/20 0858  . bisacodyl (DULCOLAX) suppository 10 mg  10 mg Rectal Daily PRN Hooten, Laurice Record, MD      . Calazime Skin Protectant PSTE 1 application  1 application Topical QID PRN Hooten, Laurice Record, MD      . carvedilol (COREG) tablet 3.125 mg  3.125 mg Oral BID Debbe Odea, MD   3.125 mg at 04/20/20 0945  . ceFAZolin (ANCEF) IVPB 2g/100 mL premix  2 g Intravenous Q8H Mackynzie Woolford, Joellyn Quails, MD 200 mL/hr at 04/20/20 1805 2 g at 04/20/20 1805  . Chlorhexidine Gluconate Cloth 2 % PADS 6 each  6 each Topical Daily Hooten, Laurice Record, MD      . cyanocobalamin ((VITAMIN B-12)) injection 1,000 mcg  1,000 mcg Subcutaneous Q1200 Debbe Odea, MD   1,000 mcg at 04/20/20 1246  . diphenhydrAMINE (BENADRYL) 12.5 MG/5ML elixir 12.5-25 mg  12.5-25 mg Oral Q4H PRN Hooten, Laurice Record, MD      . enoxaparin (LOVENOX) injection 30 mg  30 mg Subcutaneous Q12H Hooten, Laurice Record, MD   30 mg at 04/20/20 0945  . escitalopram  (LEXAPRO) tablet 10 mg  10 mg Oral Daily Hooten, Laurice Record, MD   10 mg at 04/20/20 0945  . ferrous sulfate tablet 325 mg  325 mg Oral BID WC Hooten, Laurice Record, MD   325 mg at 04/20/20 1805  . HYDROmorphone (DILAUDID) injection 0.5-1 mg  0.5-1 mg Intravenous Q4H PRN Hooten, Laurice Record, MD   1 mg at 04/18/20 0944  . loratadine (CLARITIN) tablet 10 mg  10 mg Oral Daily Hooten, Laurice Record, MD   10 mg at 04/20/20 0945  . magnesium hydroxide (MILK OF MAGNESIA) suspension 30 mL  30 mL Oral Daily PRN Rizwan, Eunice Blase, MD      . menthol-cetylpyridinium (CEPACOL) lozenge 3 mg  1 lozenge Oral PRN Hooten, Laurice Record, MD       Or  . phenol (CHLORASEPTIC) mouth spray 1 spray  1 spray Mouth/Throat PRN Hooten, Laurice Record, MD      . multivitamin-lutein (OCUVITE-LUTEIN) capsule 1 capsule  1 capsule Oral BID Hooten, Laurice Record, MD   1 capsule at 04/19/20 0854  . nystatin ointment (MYCOSTATIN) 1 application  1 application Topical Daily PRN Hooten, Laurice Record, MD      . ondansetron (ZOFRAN) injection 4 mg  4 mg Intravenous Q6H Debbe Odea, MD   4 mg at 04/20/20 1812  . pantoprazole (PROTONIX) EC tablet 40 mg  40 mg Oral BID Dereck Leep, MD   40 mg at 04/20/20 0945  . polyethylene glycol (MIRALAX / GLYCOLAX) packet 17 g  17 g Oral Daily Hooten, Laurice Record, MD      . polyvinyl alcohol (LIQUIFILM TEARS) 1.4 % ophthalmic solution 1 drop  1 drop Both Eyes TID PRN Hooten, Laurice Record, MD      . promethazine (PHENERGAN) 12.5 mg in sodium chloride 0.9 % 50 mL IVPB  12.5 mg Intravenous Q6H PRN Rizwan, Saima, MD      . protein supplement (ENSURE MAX) liquid  11 oz Oral BID Dereck Leep, MD   11 oz at 04/19/20 2223  . senna-docusate (Senokot-S) tablet 1 tablet  1 tablet Oral QHS PRN Debbe Odea, MD      .  sodium phosphate (FLEET) 7-19 GM/118ML enema 1 enema  1 enema Rectal Once PRN Hooten, Laurice Record, MD      . traMADol Veatrice Bourbon) tablet 50-100 mg  50-100 mg Oral Q4H PRN Dereck Leep, MD   100 mg at 04/19/20 2223     Abtx:  Anti-infectives (From  admission, onward)   Start     Dose/Rate Route Frequency Ordered Stop   04/20/20 1700  ceFAZolin (ANCEF) IVPB 2g/100 mL premix        2 g 200 mL/hr over 30 Minutes Intravenous Every 8 hours 04/20/20 1603     04/17/20 2345  ciprofloxacin (CIPRO) tablet 500 mg  Status:  Discontinued        500 mg Oral 2 times daily 04/17/20 2245 04/20/20 1602   04/17/20 2345  rifampin (RIFADIN) capsule 300 mg  Status:  Discontinued        300 mg Oral 2 times daily 04/17/20 2245 04/20/20 1602   04/17/20 2345  ceFAZolin (ANCEF) IVPB 2g/100 mL premix        2 g 200 mL/hr over 30 Minutes Intravenous Every 6 hours 04/17/20 2245 04/18/20 0555   04/17/20 1842  gentamicin (GARAMYCIN) injection  Status:  Discontinued          As needed 04/17/20 1843 04/17/20 1918   04/17/20 1730  vancomycin (VANCOCIN) powder  Status:  Discontinued          As needed 04/17/20 1730 04/17/20 1918   04/17/20 1728  Tobramycin Sulfate POWD  Status:  Discontinued          As needed 04/17/20 1730 04/17/20 1918   04/17/20 0930  ceFAZolin (ANCEF) 2-4 GM/100ML-% IVPB       Note to Pharmacy: Ronnell Freshwater   : cabinet override      04/17/20 0930 04/17/20 1204   04/17/20 0600  ceFAZolin (ANCEF) IVPB 2g/100 mL premix        2 g 200 mL/hr over 30 Minutes Intravenous On call to O.R. 04/17/20 0038 04/17/20 1607      REVIEW OF SYSTEMS:  Const: negative fever, negative chills, negative weight loss Eyes: negative diplopia or visual changes, negative eye pain ENT: negative coryza, negative sore throat Resp: negative cough, hemoptysis, dyspnea Cards: negative for chest pain, palpitations, lower extremity edema GU: negative for frequency, dysuria and hematuria GI: Severe nausea and says she has lost close to 100 pounds in 1 year. Appetite poor Skin: negative for rash and pruritus Heme: negative for easy bruising and gum/nose bleeding MS generalized weakness. Inability to walk Neurolo:negative for headaches, dizziness, vertigo, memory problems   Psych: Anxiety depression and feels overwhelmed Endocrine: negative for thyroid, diabetes Allergy/Immunology-as above.  Most of them are GI side effects and not an allergic reaction. Objective:  VITALS:  BP (!) 145/65 (BP Location: Right Arm)   Pulse 86   Temp 97.7 F (36.5 C) (Oral)   Resp 17   Ht 5\' 8"  (1.727 m)   Wt 83.2 kg   SpO2 100%   BMI 27.90 kg/m  PHYSICAL EXAM:  General: Alert, cooperative, emotionally distressed and is crying, appears stated age.  Head: Normocephalic, without obvious abnormality, atraumatic. Eyes: Conjunctivae clear, anicteric sclerae. Pupils are equal ENT Nares normal. No drainage or sinus tenderness. Lips, mucosa, and tongue normal. No Thrush Neck: Supple, symmetrical, no adenopathy, thyroid: non tender no carotid bruit and no JVD. Lungs: Clear to auscultation bilaterally. No Wheezing or Rhonchi. No rales. Heart: Regular rate and rhythm, no murmur, rub or gallop.  Abdomen: Soft, non-tender,not distended. Bowel sounds normal. No masses Extremities: Right ankle: Medial and lateral surgical site with staples.  No erythema or discharge.    Right knee surgical site is covered with dressing and did not examine it. Lymph: Cervical, supraclavicular normal. Neurologic: Grossly non-focal Pertinent Labs Lab Results CBC    Component Value Date/Time   WBC 5.3 04/20/2020 0547   RBC 3.55 (L) 04/20/2020 0547   HGB 8.7 (L) 04/20/2020 0547   HCT 28.3 (L) 04/20/2020 0547   PLT 249 04/20/2020 0547   MCV 79.7 (L) 04/20/2020 0547   MCH 24.5 (L) 04/20/2020 0547   MCHC 30.7 04/20/2020 0547   RDW 16.1 (H) 04/20/2020 0547   LYMPHSABS 1.0 04/17/2020 2248   MONOABS 0.9 04/17/2020 2248   EOSABS 0.0 04/17/2020 2248   BASOSABS 0.0 04/17/2020 2248    CMP Latest Ref Rng & Units 04/19/2020 04/18/2020 04/17/2020  Glucose 70 - 99 mg/dL 99 117(H) 142(H)  BUN 8 - 23 mg/dL 14 17 16   Creatinine 0.44 - 1.00 mg/dL 0.80 1.09(H) 1.06(H)  Sodium 135 - 145 mmol/L 139 137 138   Potassium 3.5 - 5.1 mmol/L 3.9 4.9 4.9  Chloride 98 - 111 mmol/L 110 105 105  CO2 22 - 32 mmol/L 25 25 24   Calcium 8.9 - 10.3 mg/dL 8.7(L) 9.2 9.7  Total Protein 6.5 - 8.1 g/dL - - 6.2(L)  Total Bilirubin 0.3 - 1.2 mg/dL - - 0.6  Alkaline Phos 38 - 126 U/L - - 91  AST 15 - 41 U/L - - 37  ALT 0 - 44 U/L - - 26      Microbiology: Recent Results (from the past 240 hour(s))  SARS Coronavirus 2 by RT PCR (hospital order, performed in [facility] hospital lab) Nasopharyngeal Nasopharyngeal Swab     Status: None   Collection Time: 04/17/20  9:01 AM   Specimen: Nasopharyngeal Swab  Result Value Ref Range Status   SARS Coronavirus 2 NEGATIVE NEGATIVE Final    Comment: (NOTE) SARS-CoV-2 target nucleic acids are NOT DETECTED.  The SARS-CoV-2 RNA is generally detectable in upper and lower respiratory specimens during the acute phase of infection. The lowest concentration of SARS-CoV-2 viral copies this assay can detect is 250 copies / mL. A negative result does not preclude SARS-CoV-2 infection and should not be used as the sole basis for treatment or other patient management decisions.  A negative result may occur with improper specimen collection / handling, submission of specimen other than nasopharyngeal swab, presence of viral mutation(s) within the areas targeted by this assay, and inadequate number of viral copies (<250 copies / mL). A negative result must be combined with clinical observations, patient history, and epidemiological information.  Fact Sheet for Patients:   StrictlyIdeas.no  Fact Sheet for Healthcare Providers: BankingDealers.co.za  This test is not yet approved or  cleared by the [place] FDA and has been authorized for detection and/or diagnosis of SARS-CoV-2 by FDA under an Emergency Use Authorization (EUA).  This EUA will remain in effect (meaning this test can be used) for the duration of the COVID-19  declaration under Section 564(b)(1) of the Act, 21 U.S.C. section 360bbb-3(b)(1), unless the authorization is terminated or revoked sooner.  Performed at [facility], [address], [place], [place] [address]   Surgical pcr screen     Status: None   Collection Time: 04/17/20  9:38 AM   Specimen: Nasal Mucosa; Nasal Swab  Result Value Ref Range Status   MRSA, PCR  NEGATIVE NEGATIVE Final   Staphylococcus aureus NEGATIVE NEGATIVE Final    Comment: (NOTE) The Xpert SA Assay (FDA approved for NASAL specimens in patients 47 years of age and older), is one component of a comprehensive surveillance program. It is not intended to diagnose infection nor to guide or monitor treatment. Performed at [facility], [place], [place] [address]   Aerobic/Anaerobic Culture w Gram Stain (surgical/deep wound)     Status: None (Preliminary result)   Collection Time: 04/17/20 12:23 PM   Specimen: Ankle  Result Value Ref Range Status   Specimen Description ANKLE  Final   Special Requests POSTERIOR LATERAL RIGHT ANKLE PLATE  Final   Gram Stain   Final    FEW WBC PRESENT, PREDOMINANTLY PMN NO ORGANISMS SEEN    Culture   Final    NO GROWTH 3 DAYS NO ANAEROBES ISOLATED; CULTURE IN PROGRESS FOR 5 DAYS Performed at [facility], [address] [place], [place] [address]    Report Status PENDING  Incomplete  Gram stain     Status: None   Collection Time: 04/17/20  1:00 PM   Specimen: PATH Other; Tissue  Result Value Ref Range Status   Specimen Description ANKLE POSTERIOR LATERAL ANKLE RIGHT PLATE  Final   Special Requests NONE  Final   Gram Stain   Final    NO ORGANISMS SEEN FEW WBC SEEN Performed at [facility], [address], [place], [place] [address]    Report Status 04/17/2020 FINAL  Final  Gram stain     Status: None   Collection Time: 04/17/20  1:16 PM   Specimen: PATH Other; Tissue  Result Value Ref Range Status   Specimen  Description ANKLE  Final   Special Requests NONE  Final   Gram Stain   Final    NO ORGANISMS SEEN FEW WBC SEEN Performed at [facility], [address] [place], [place] [address]    Report Status 04/17/2020 FINAL  Final  Aerobic/Anaerobic Culture w Gram Stain (surgical/deep wound)     Status: None (Preliminary result)   Collection Time: 04/17/20  1:16 PM   Specimen: PATH Other; Tissue  Result Value Ref Range Status   Specimen Description   Final    ANKLE Performed at [facility], [address], [place], [place] [address]    Special Requests   Final    NONE Performed at [facility], [address] [place], [place] [address]    Gram Stain   Final    RARE WBC PRESENT, PREDOMINANTLY PMN NO ORGANISMS SEEN    Culture   Final    NO GROWTH 3 DAYS NO ANAEROBES ISOLATED; CULTURE IN PROGRESS FOR 5 DAYS Performed at [facility], [place] [address] [place], [place] [address]    Report Status PENDING  Incomplete  Gram stain     Status: None   Collection Time: 04/17/20  1:25 PM   Specimen: PATH Other; Tissue  Result Value Ref Range Status   Specimen Description WOUND RIGHT DISTAL FIBULA  Final   Special Requests NONE  Final   Gram Stain   Final    NO ORGANISMS SEEN FEW WBC SEEN Performed at [facility], [address], [place], [place] [address]    Report Status 04/17/2020 FINAL  Final  Aerobic/Anaerobic Culture w Gram Stain (surgical/deep wound)     Status: None (Preliminary result)   Collection Time: 04/17/20  1:25 PM  Specimen: PATH Other; Tissue  Result Value Ref Range Status   Specimen Description   Final    WOUND RIGHT DISTAL FIBULA Performed at [facility], [place] [address], [place], [place] [address]    Special Requests   Final    NONE Performed at [facility], [address] [place], [place] [address]    Gram Stain   Final    FEW WBC PRESENT, PREDOMINANTLY PMN NO ORGANISMS SEEN    Culture   Final     NO GROWTH 3 DAYS NO ANAEROBES ISOLATED; CULTURE IN PROGRESS FOR 5 DAYS Performed at [facility] [address] [place], [place] [address]    Report Status PENDING  Incomplete  Gram stain     Status: None   Collection Time: 04/17/20  2:24 PM   Specimen: PATH Other; Tissue  Result Value Ref Range Status   Specimen Description WOUND  Final   Special Requests MEDIAL RIGHT SCREW  Final   Gram Stain   Final    NO ORGANISMS SEEN FEW WBC SEEN RESULT CALLED TO, READ BACK BY AND VERIFIED WITH: ADRIENNE ALLRED AT 4010 ON 04/17/20 BY SS Performed at [facility], [address], [place], [place] [address]    Report Status 04/17/2020 FINAL  Final  Aerobic/Anaerobic Culture w Gram Stain (surgical/deep wound)     Status: None (Preliminary result)   Collection Time: 04/17/20  2:24 PM   Specimen: PATH Other; Tissue  Result Value Ref Range Status   Specimen Description   Final    WOUND Performed at [facility], [address], [place], [place] [address]    Special Requests   Final    MEDIAL SCREW RIGHT Performed at [facility], [address] [place], [place] [address]    Gram Stain   Final    RARE WBC PRESENT,BOTH PMN AND MONONUCLEAR NO ORGANISMS SEEN    Culture   Final    NO GROWTH 2 DAYS NO ANAEROBES ISOLATED; CULTURE IN PROGRESS FOR 5 DAYS Performed at [facility], [place] [address], [place], [place] [address]    Report Status PENDING  Incomplete  Gram stain     Status: None   Collection Time: 04/17/20  2:29 PM   Specimen: PATH Other; Tissue  Result Value Ref Range Status   Specimen Description WOUND  Final   Special Requests MEDIAL RIGHT ANKLE  Final   Gram Stain   Final    NO ORGANISMS SEEN RARE WBC SEEN RESULT CALLED TO, READ BACK BY AND VERIFIED WITH: ADRIENNE ALLRED AT 8756 ON 04/17/20 BY SS Performed at [facility], [address], [place], [place] [address]    Report Status 04/17/2020 FINAL  Final   Aerobic/Anaerobic Culture w Gram Stain (surgical/deep wound)     Status: None (Preliminary result)   Collection Time: 04/17/20  2:29 PM   Specimen: PATH Other; Tissue  Result Value Ref Range Status   Specimen Description   Final    WOUND Performed at [facility], [address], [place], [place] [address]    Special Requests MEDIAL RIGHT ANKLE  Final   Gram Stain NO WBC SEEN NO ORGANISMS SEEN   Final   Culture   Final    NO GROWTH 2 DAYS NO ANAEROBES ISOLATED; CULTURE IN PROGRESS FOR 5 DAYS Performed at [facility], [place] [address] [place], [place] [address]    Report Status PENDING  Incomplete  Gram  stain     Status: None   Collection Time: 04/17/20  3:51 PM   Specimen: PATH Other; Tissue  Result Value Ref Range Status   Specimen Description TISSUE  Final   Special Requests RIGHT KNEE ASPIRATION  Final   Gram Stain   Final    NO ORGANISMS SEEN MANY WBC SEEN Performed at [facility], [address], [place], [place] [address]    Report Status 04/17/2020 FINAL  Final  Aerobic/Anaerobic Culture w Gram Stain (surgical/deep wound)     Status: None (Preliminary result)   Collection Time: 04/17/20  3:51 PM   Specimen: PATH Other; Tissue  Result Value Ref Range Status   Specimen Description   Final    TISSUE Performed at [facility], [address] [place], [place] [address]    Special Requests   Final     RIGHT KNEE ASPIRATION Performed at [facility], [address] [place], [place] [address]    Gram Stain   Final    MODERATE WBC PRESENT,BOTH PMN AND MONONUCLEAR NO ORGANISMS SEEN    Culture   Final    NO GROWTH 2 DAYS NO ANAEROBES ISOLATED; CULTURE IN PROGRESS FOR 5 DAYS Performed at [facility] [address], [place], [place] [address]    Report Status PENDING  Incomplete  Gram stain     Status: None   Collection Time: 04/17/20  3:58 PM   Specimen: PATH Other; Tissue  Result Value Ref Range Status    Specimen Description KNEE KNEE  Final   Special Requests NONE  Final   Gram Stain   Final    NO ORGANISMS SEEN FEW WBC SEEN Performed at [facility], [address], [place], [place] [address]    Report Status 04/17/2020 FINAL  Final  Gram stain     Status: None   Collection Time: 04/17/20  3:58 PM   Specimen: PATH Other; Tissue  Result Value Ref Range Status   Specimen Description WOUND  Final   Special Requests NONE  Final   Gram Stain   Final    NO ORGANISMS SEEN FEW WBC SEEN Performed at [facility], [address], [place], [place] [address]    Report Status 04/17/2020 FINAL  Final  Aerobic/Anaerobic Culture w Gram Stain (surgical/deep wound)     Status: None (Preliminary result)   Collection Time: 04/17/20  3:58 PM   Specimen: PATH Other; Tissue  Result Value Ref Range Status   Specimen Description   Final    WOUND Performed at [facility], [address] [place], [place] [address]    Special Requests   Final    ANTERIOR KNEE Performed at [facility], [address] [place], [place] [address]    Gram Stain   Final    RARE WBC PRESENT, PREDOMINANTLY MONONUCLEAR NO ORGANISMS SEEN    Culture   Final    NO GROWTH 2 DAYS NO ANAEROBES ISOLATED; CULTURE IN PROGRESS FOR 5 DAYS Performed at [facility], [place] [address], [place], [place] [address]    Report Status PENDING  Incomplete  Aerobic/Anaerobic Culture w Gram Stain (surgical/deep wound)     Status: None (Preliminary result)   Collection Time: 04/17/20  3:58 PM   Specimen: PATH Other; Tissue  Result Value Ref Range Status   Specimen Description   Final    WOUND Performed at [facility], [address], [place],  [address]  Special Requests POSTERIOR RECESS RIGHT KNEE  Final   Gram Stain   Final    RARE WBC PRESENT, PREDOMINANTLY PMN NO ORGANISMS SEEN    Culture   Final    NO GROWTH 2 DAYS NO ANAEROBES ISOLATED; CULTURE IN PROGRESS FOR 5  DAYS Performed at [place] [address], [place], [place] [address]    Report Status PENDING  Incomplete  Gram stain     Status: None   Collection Time: 04/17/20  4:24 PM   Specimen: PATH Other; Tissue  Result Value Ref Range Status   Specimen Description TISSUE  Final   Special Requests RIGHT FEMUR UNDER IMPLANT  Final   Gram Stain   Final    NO ORGANISMS SEEN FEW WBC SEEN Performed at [facility], [address] [place], [place] [address]    Report Status 04/17/2020 FINAL  Final  Aerobic/Anaerobic Culture w Gram Stain (surgical/deep wound)     Status: None (Preliminary result)   Collection Time: 04/17/20  4:24 PM   Specimen: PATH Other; Tissue  Result Value Ref Range Status   Specimen Description   Final    TISSUE Performed at [facility], [address], [place], [place] [address]    Special Requests   Final     RIGHT FEMUR UNDER IMPLANT Performed at [facility], [address] [place], [place] [address]    Gram Stain   Final    RARE WBC PRESENT,BOTH PMN AND MONONUCLEAR NO ORGANISMS SEEN    Culture   Final    NO GROWTH 2 DAYS NO ANAEROBES ISOLATED; CULTURE IN PROGRESS FOR 5 DAYS Performed at [place] [address], [place], [place] [address]    Report Status PENDING  Incomplete  Gram stain     Status: None   Collection Time: 04/17/20  4:56 PM   Specimen: PATH Other; Tissue  Result Value Ref Range Status   Specimen Description TISSUE  Final   Special Requests TIBIAL CANAL  Final   Gram Stain   Final    NO ORGANISMS SEEN MANY WBC SEEN Performed at [facility], [address] [place], [place] [address]    Report Status 04/17/2020 FINAL  Final  Aerobic/Anaerobic Culture w Gram Stain (surgical/deep wound)     Status: None (Preliminary result)   Collection Time: 04/17/20  4:56 PM   Specimen: PATH Other; Tissue  Result Value Ref Range Status   Specimen Description   Final    TISSUE Performed at  [facility], [address], [place], [place] [address]    Special Requests   Final    TIBIAL CANAL Performed at [facility], [address] [place], [place] [address]    Gram Stain   Final    ABUNDANT WBC PRESENT,BOTH PMN AND MONONUCLEAR NO ORGANISMS SEEN    Culture   Final    NO GROWTH 2 DAYS NO ANAEROBES ISOLATED; CULTURE IN PROGRESS FOR 5 DAYS Performed at [facility], [place] [address] [place],  [address]    Report Status PENDING  Incomplete  Culture, Urine     Status: Abnormal   Collection Time: 04/18/20 12:35 PM   Specimen: Urine, Catheterized  Result Value Ref Range Status   Specimen Description   Final    URINE, CATHETERIZED Performed at [facility], [address] [place],  [address]    Special Requests   Final    NONE Performed at  [place], [place] [address]    Culture 10,000 COLONIES/mL YEAST (A)  Final   Report Status 04/19/2020 FINAL  Final    IMAGING RESULTS:  I have personally reviewed the films ? Impression/Recommendation ? 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  Underwent debridement and polyexchange on 01/24/2020 and was on 6 weeks of IV cefazolin and rifampin oral.  Then the cefazolin was changed to cefadroxil but she could not tolerate cefadroxil and she went on doxycycline and finally on ciprofloxacin plus rifampin which she has been taking since early February 2022. She underwent removal of the hardware on 04/17/2020.  Surgical cultures have all been negative so far As there is no foreign body rifampin can be stopped.  Also ciprofloxacin can be switched to cefazolin. On discharge if cultures all remain negative we can switch her to p.o. Keflex or Augmentin for total of 10 to 14 days.  Complicated infection of the right ankle.  Fracture followed by ORIF complicated by MSSA infection in 2021 leading to sinus. Pseudomonas  cultured from the wound and February prompted change of antibiotics to ciprofloxacin. All hardware has been removed on 04/17/2020.  Cultures so far negative.  Anemia due to chronic infection  Nausea which has been persistent as per patient.  Most of the medication she takes by mouth especially antibiotics can cause nausea.  She is on continuous Zofran. She has significant weight loss she says. ___________________________________________________ Discussed with patient, and requesting provider Note:  This document was prepared using Dragon voice recognition software and may include unintentional dictation errors.

## 2020-04-21 DIAGNOSIS — T8459XD Infection and inflammatory reaction due to other internal joint prosthesis, subsequent encounter: Secondary | ICD-10-CM

## 2020-04-21 DIAGNOSIS — R110 Nausea: Secondary | ICD-10-CM

## 2020-04-21 DIAGNOSIS — B3749 Other urogenital candidiasis: Secondary | ICD-10-CM

## 2020-04-21 LAB — C-REACTIVE PROTEIN: CRP: 7.9 mg/dL — ABNORMAL HIGH (ref <1.0)

## 2020-04-21 LAB — SEDIMENTATION RATE: Sed Rate: 65 mm/hr — ABNORMAL HIGH (ref 0–30)

## 2020-04-21 MED ORDER — FLUCONAZOLE 100 MG PO TABS
100.0000 mg | ORAL_TABLET | Freq: Every day | ORAL | Status: AC
  Administered 2020-04-21 – 2020-04-23 (×3): 100 mg via ORAL
  Filled 2020-04-21 (×3): qty 1

## 2020-04-21 MED ORDER — ONDANSETRON HCL 4 MG/2ML IJ SOLN
4.0000 mg | Freq: Four times a day (QID) | INTRAMUSCULAR | Status: DC | PRN
  Administered 2020-04-21: 4 mg via INTRAVENOUS
  Filled 2020-04-21: qty 2

## 2020-04-21 MED ORDER — PHENAZOPYRIDINE HCL 100 MG PO TABS
100.0000 mg | ORAL_TABLET | Freq: Two times a day (BID) | ORAL | Status: AC
  Administered 2020-04-21 – 2020-04-23 (×4): 100 mg via ORAL
  Filled 2020-04-21 (×4): qty 1

## 2020-04-21 MED FILL — Tobramycin Sulfate For Inj 1.2 GM: INTRAMUSCULAR | Qty: 3.6 | Status: AC

## 2020-04-21 NOTE — Progress Notes (Signed)
  Subjective: 4 Days Post-Op Procedure(s) (LRB): HARDWARE REMOVAL FROM RIGHT ANKLE AND RIGHT KNEE; IMPLANT OF CEMENT SPACERS IN RIGHT KNEE (Right) Patient reports pain as well-controlled.   Patient is having problems with urinary frequency and burning since last evening. She states she was up all night and urinating constantly.  Negative for chest pain and shortness of breath Fever: no Patient has had a bowel movement.  Objective: Vital signs in last 24 hours: Temp:  [97.7 F (36.5 C)-98.4 F (36.9 C)] 98 F (36.7 C) (03/22 0807) Pulse Rate:  [71-86] 84 (03/22 0807) Resp:  [17-19] 19 (03/22 0807) BP: (145-159)/(65-93) 159/70 (03/22 0807) SpO2:  [95 %-100 %] 99 % (03/22 0807)  Intake/Output from previous day:  Intake/Output Summary (Last 24 hours) at 04/21/2020 0916 Last data filed at 04/20/2020 1830 Gross per 24 hour  Intake 480 ml  Output --  Net 480 ml    Intake/Output this shift: No intake/output data recorded.  Labs: Recent Labs    04/19/20 0348 04/19/20 2004 04/20/20 0547  HGB 7.2* 8.3* 8.7*   Recent Labs    04/19/20 0348 04/20/20 0547  WBC 5.7 5.3  RBC 2.98* 3.55*  HCT 23.1* 28.3*  PLT 258 249   Recent Labs    04/19/20 0348  NA 139  K 3.9  CL 110  CO2 25  BUN 14  CREATININE 0.80  GLUCOSE 99  CALCIUM 8.7*   No results for input(s): LABPT, INR in the last 72 hours.   EXAM General - Patient is Alert, Appropriate and Oriented Extremity - Neurovascular intact Dorsiflexion/Plantar flexion intact Compartment soft Dressing/Incision -mild drainage noted from medial wound, no drainage from ankle incisions or knee incisions. No signs of infection noted Motor Function - intact, moving foot and toes well on exam.  Cardiovascular- Regular rate and rhythm, no murmurs/rubs/gallops Respiratory- Lungs clear to auscultation bilaterally Gastrointestinal- soft and nontender   Assessment/Plan: 4 Days Post-Op Procedure(s) (LRB): HARDWARE REMOVAL FROM RIGHT  ANKLE AND RIGHT KNEE; IMPLANT OF CEMENT SPACERS IN RIGHT KNEE (Right) Principal Problem:   Chest pain Active Problems:   Extrinsic asthma   OSA on CPAP   Multiple lung nodules   Pressure injury of skin   COPD (chronic obstructive pulmonary disease) (HCC)   GERD (gastroesophageal reflux disease)   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HCC)   Anxiety   History of GI diverticular bleed  Estimated body mass index is 27.9 kg/m as calculated from the following:   Height as of this encounter: 5\' 8"  (1.727 m).   Weight as of this encounter: 83.2 kg. Advance diet Up with therapy  Dressings changed at ankle, AceWrap applied.   Fresh honeycomb dressing applied to knee incision.   -urinary frequency, burning -symptomatic yeast infection Patient seen in conjunction with Dr. Marry Guan. Dr. Marry Guan communicated with Dr Delaine Lame regarding treatment of likely symptomatic yeast infection.   DVT Prophylaxis - Lovenox, Ted hose and foot pumps Touchdown Weight-Bearing  to right leg  Cassell Smiles, PA-C [facility] 04/21/2020, 9:16 AM

## 2020-04-21 NOTE — Progress Notes (Signed)
ID Pt had burning urine the whole night Did not sleep well Frequent urination Urine culture shows only yeast Also has calcium oxalate crystals Started fluconazole today Says there is minimal improvement  Patient Vitals for the past 24 hrs:  BP Temp Temp src Pulse Resp SpO2 Weight  04/21/20 1628 (!) 146/71 98.7 F (37.1 C) Oral 80 18 100 % --  04/21/20 1151 (!) 144/75 98.4 F (36.9 C) Oral 85 18 100 % 88.5 kg  04/21/20 0807 (!) 159/70 98 F (36.7 C) Oral 84 19 99 % --  04/21/20 0502 (!) 149/67 97.7 F (36.5 C) Oral 83 18 100 % --  04/20/20 2102 (!) 145/93 98.4 F (36.9 C) -- 84 17 95 % --   Awake and alert Chest b/l air entry [id number] abd soft Rt knee in brace Rt ankle dressing   Labs CBC Latest Ref Rng & Units 04/20/2020 04/19/2020 04/19/2020  WBC 4.0 - 10.5 K/uL 5.3 - 5.7  Hemoglobin 12.0 - 15.0 g/dL 5.2(W) 8.3(L) 7.2(L)  Hematocrit 36.0 - 46.0 % 28.3(L) - 23.1(L)  Platelets 150 - 400 K/uL 249 - 258    CMP Latest Ref Rng & Units 04/19/2020 04/18/2020 04/17/2020  Glucose 70 - 99 mg/dL 99 413(K) 440(N)  BUN 8 - 23 mg/dL 14 17 16   Creatinine 0.44 - 1.00 mg/dL 0.27 2.53(G) 6.44(I)  Sodium 135 - 145 mmol/L 139 137 138  Potassium 3.5 - 5.1 mmol/L 3.9 4.9 4.9  Chloride 98 - 111 mmol/L 110 105 105  CO2 22 - 32 mmol/L 25 25 24   Calcium 8.9 - 10.3 mg/dL 3.4(V) 9.2 9.7  Total Protein 6.5 - 8.1 g/dL - - 6.2(L)  Total Bilirubin 0.3 - 1.2 mg/dL - - 0.6  Alkaline Phos 38 - 126 U/L - - 91  AST 15 - 41 U/L - - 37  ALT 0 - 44 U/L - - 26    MICRO Multiple cultures> 6 from the ankle and right knee are all negative so far  Impression/recommendation 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  Underwent debridement and polyexchange on 01/24/2020 and was on 6 weeks of IV cefazolin and rifampin.  Then she went on p.o. antibiotics last of which was ciprofloxacin plus rifampin since early February to complete a 53-month course.  Because of nonhealing  surgical site of the right knee and the right ankle she underwent removal of the hardware on 04/17/2020.  Surgical cultures were all been negative so far.  Currently there is no metal hardware.   rifampin and ciprofloxacin has been stopped.  She is currently on IV cefazolin.  If cultures remain negative on discharge she can be switched over to p.o. Keflex and or p.o. Augmentin for a total of 10 to 14 days.  Complicated infection of the right ankle.  Initially had a fracture for ORIF which was complicated by MSSA infection in 21 leading to sinus tract.  Recently Pseudomonas was cultured from the wound and hence she was on ciprofloxacin since February 2022. All hardware has been removed on 04/17/2020 and cultures have been negative so far.  Anemia due to chronic infection  Persistent nausea.  Likely drug related.  Candida UTI She is symptomatic and hence we will treat her with fluconazole 100 mg for 3 days. Will give pyridium 100mg  PO BID  for burning for couple of days  Discussed the management with patient and her nurse

## 2020-04-21 NOTE — Anesthesia Postprocedure Evaluation (Signed)
Anesthesia Post Note  Patient: Jasmine Morrow  Procedure(s) Performed: HARDWARE REMOVAL FROM RIGHT ANKLE AND RIGHT KNEE; IMPLANT OF CEMENT SPACERS IN RIGHT KNEE (Right )  Patient location during evaluation: PACU Anesthesia Type: General Level of consciousness: awake and alert and oriented Pain management: pain level controlled Vital Signs Assessment: post-procedure vital signs reviewed and stable Respiratory status: spontaneous breathing Cardiovascular status: blood pressure returned to baseline Anesthetic complications: no Comments: Patient evaluated by hospitalist for resolving chest pain.  Cardiac exam negative.   No complications documented.   Last Vitals:  Vitals:   04/21/20 0502 04/21/20 0807  BP: (!) 149/67 (!) 159/70  Pulse: 83 84  Resp: 18 19  Temp: 36.5 C 36.7 C  SpO2: 100% 99%    Last Pain:  Vitals:   04/21/20 0807  TempSrc: Oral  PainSc:                  [place] Kardell

## 2020-04-21 NOTE — Progress Notes (Signed)
Occupational Therapy Treatment Patient Details Name: MICHAELINA BLANDINO MRN: [id number] DOB: [birth year] Today's Date: 04/21/2020    History of present illness Pt is a 75 y/o F admitted on 04/16/20 via EMS for re-evaluation of R knee & R ankle. Pt is 2.5 months s/p R knee arthrotomy, I&D, polyethylene exchange & placement of antibiotic eluding beads. Cultures were (+) for methicillin sensitive staph aureus & pt completed course of ancef & has continued with other medications. Pt continues to have serous drainage from medial lesion of R ankle which grew pseudomonas putida. Pt underwent hardware removal from R ankle with I&D & placement of antibiotic beads & R knee arthrotomy, extensive I&D, removal of R TKA implants & placement of antibiotic cement spacer on 04/17/20 by Dr. Marry Guan. Pt is now TDWB RLE in R knee immobilizer. Post-operatively pt developed chest pain & hospitalists were consulted.  PMH: asthma, GERD, OSA on CPAP, anxiety, COPD, multiple lung nodules, hx of ICU admission for GI bleed with hemorrhagic shock.   OT comments  Pt seen for OT co-treatment with PT on this date. Upon arrival to room, pt awake in bed, with PT at bedside. Pt tearful throughout session, stating that she is discouraged by medical course and "hates being a burden"; OT & PT provided comfort throughout. Pt agreeable to session, however initially deferring OOB mobility. This date, pt was able to perform supine<>sit transfers with MIN A and seated UB ADLs with SUPERVISION/SET-UP. Pt able to tolerate sitting EOB to complete ADLs and seated therex for ~20 mins. Following success with seated ADLs and MAX encouragement from OT & PT, pt agreeable to x2 bouts of sit<>stand transfers with RW, initially requiring MOD Ax2 and progressing to MIN Ax2. OT/PT discussed with pt d/c recommendation change to SNF based on current level of assist required for functional mobility, current anxiety with OOB mobility, and level of assistance available at  home, with pt verbalizing that she did not want to return to the facility she was at previously. Pt encouraged to speak to case worker about SNF options. Pt is making good progress toward goals and continues to benefit from skilled OT services to maximize return to PLOF and minimize risk of future falls, injury, caregiver burden, and readmission. Discharge recommendation updated to SNF. Frequency remains appropriate.    Follow Up Recommendations  SNF    Equipment Recommendations  None recommended by OT       Precautions / Restrictions Precautions Precautions: Fall Required Braces or Orthoses: Other Brace Other Brace: R hinged knee brace locked in extension Restrictions Weight Bearing Restrictions: Yes RLE Weight Bearing: Touchdown weight bearing       Mobility Bed Mobility Overal bed mobility: Needs Assistance Bed Mobility: Supine to Sit;Sit to Supine     Supine to sit: Min assist;HOB elevated Sit to supine: Min assist   General bed mobility comments: With use of bedrails, pt only requiring MIN A for managing LLE    Transfers Overall transfer level: Needs assistance   Transfers: Sit to/from Stand Sit to Stand: Min assist;Mod assist         General transfer comment: x1 bouts; pt initially MOD Ax2 during first attempt and progressing to MIN Ax2 with second attempt.    Balance Overall balance assessment: Needs assistance Sitting-balance support: No upper extremity supported;Feet supported Sitting balance-Leahy Scale: Good Sitting balance - Comments: Able to maintain balance at EOB for ~20 to perform ADLs and seated therex   Standing balance support: Bilateral upper extremity supported;During functional activity  Standing balance-Leahy Scale: Poor                             ADL either performed or assessed with clinical judgement   ADL Overall ADL's : Needs assistance/impaired     Grooming: Wash/dry face;Applying deodorant;Brushing  hair;Supervision/safety;Set up;Sitting Grooming Details (indicate cue type and reason): sitt EOB         Upper Body Dressing : Supervision/safety;Set up;Sitting Upper Body Dressing Details (indicate cue type and reason): Sitting EOB                   General ADL Comments: Able to tolerate sitting EOB to complete  ADLs and seated therex for ~20 mins               Cognition Arousal/Alertness: Awake/alert Behavior During Therapy: Anxious Overall Cognitive Status: Within Functional Limits for tasks assessed                                 General Comments: Pt tearful throughout session, stating that she is discouraged by medical course and "hates being a burden"; OT & PT provided comfort throughout. Pt initially deferred OOB mobility, however agreeable to sit<>stand transfers following success with seated ADLs and MAX encouragement from OT & PT        Exercises General Exercises - Upper Extremity Shoulder Flexion: Strengthening;Both;10 reps;Seated Other Exercises Other Exercises: Role of OT, POC, and d/c recommendation           Pertinent Vitals/ Pain       Pain Assessment: Faces Faces Pain Scale: Hurts a little bit Pain Location: RLE Pain Descriptors / Indicators: Guarding;Grimacing Pain Intervention(s): Limited activity within patient's tolerance;Monitored during session         Frequency  Min 1X/week        Progress Toward Goals  OT Goals(current goals can now be found in the care plan section)  Progress towards OT goals: Progressing toward goals  Acute Rehab OT Goals Patient Stated Goal: To get back to baseline OT Goal Formulation: With patient Time For Goal Achievement: 05/02/20 Potential to Achieve Goals: Good  Plan Discharge plan needs to be updated;Frequency remains appropriate    Co-evaluation    PT/OT/SLP Co-Evaluation/Treatment: Yes Reason for Co-Treatment: Complexity of the patient's impairments (multi-system  involvement);For patient/therapist safety;To address functional/ADL transfers PT goals addressed during session: Mobility/safety with mobility OT goals addressed during session: ADL's and self-care      AM-PAC OT "6 Clicks" Daily Activity     Outcome Measure   Help from another person eating meals?: None Help from another person taking care of personal grooming?: A Little Help from another person toileting, which includes using toliet, bedpan, or urinal?: A Lot Help from another person bathing (including washing, rinsing, drying)?: A Lot Help from another person to put on and taking off regular upper body clothing?: A Little Help from another person to put on and taking off regular lower body clothing?: A Lot 6 Click Score: 16    End of Session Equipment Utilized During Treatment: Gait belt;Rolling walker  OT Visit Diagnosis: Unsteadiness on feet (R26.81);Muscle weakness (generalized) (M62.81)   Activity Tolerance Patient tolerated treatment well   Patient Left in bed;with call bell/phone within reach;with bed alarm set   Nurse Communication Mobility status        Time: 0932-6712 OT Time Calculation (min): 33 min  Charges: OT General Charges $OT Visit: 1 Visit OT Treatments $Self Care/Home Management : 8-22 mins $Therapeutic Activity: 8-22 mins   Fredirick Maudlin, OTR/L [place]

## 2020-04-21 NOTE — Progress Notes (Signed)
PT Cancellation Note  Patient Details Name: Jasmine Morrow MRN: [id number] DOB: 12-25-1945   Cancelled Treatment:      PT attempt. Pt currently refusing at this time. She states," I did not sleep at all last night." Requested author return later this afternoon. PT/OT co-treat planned for after lunch. Pt was educated on importance of activity while maintaining WB restrictions.   Willette Pa 04/21/2020, 9:37 AM

## 2020-04-21 NOTE — Progress Notes (Signed)
PROGRESS NOTE    Jasmine Morrow   VVO:[id number]  DOB: Jul 15, 1945  DOA: 04/17/2020 PCP: Rusty Aus, MD   Brief Narrative:  Jasmine Morrow is an 75 y.o. female with a history of asthma, GERD, OSA on CPAP, anxiety, COPD, multiple lung nodules, as well as history of ICU admission for GI bleed with hemorrhagic shock who is being seen in consultation for chest pain which she developed postoperatively.     Subjective: Has a vaginal yeast infection today and is about to get some medicine for it from the RN. Nausea is unchanged from yesterday. No chest pain and no diarrhea.   Assessment & Plan:   Principal Problem:   Chest pain - troponin I negative - 2 D ECHO reassuring w/o wall motion abnormalities or pericardial effusion - likely GI related, cont PPI  Active Problems:  Nausea/ poor oral intake - she believes it is due to Cipro- started Zofran QID which is helping a little - as she is being switched to Keflex today (per ID), we can change Zofran to PRN  Diarrhea -3/19>  abdomen only mildly tender and not distended- this is not C diff - d/c C diff testing- reduce Miralax and Senna to PRN rather than daily - DC Reglan (was is TID routine) which causes diarrhea - diarrhea has resolved since above measures on 3/19  AKI - improved - in setting of infection, diarrhea and poor oral intake - Cr 0.6-0.7>>  Cr 1.09>> 0.80 - follow urine output  Hypotension -  SBP was in 90- 100 early on in the admission holding parameters were placed on on Coreg  Microcytic anemia- Iron and B12 deficinecy Iron              Iron    UIBC        207      UIBC   TIBC        230      TIBC   Saturation Ratios        10      Saturation Ratios   Ferritin        106      Ferritin   Folate        6.0      Folate   OTHER CHEM  Vitamin B12       171   - Ferritin is falsely elevated as an acute phase reactant as a result of the septic arthritis  - 3/20> ordered Feraheme and s/c B12 injections- Hb was  7.2  And she was given 1 U PRBC- now 8.7     Extrinsic asthma/ COPD - stable- PRN Nebs ordered  Hyperlipidemia - LDL 107    Multiple lung nodules - needs outpt follow up     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    Anxiety - cont Lexapro      Time spent in minutes: 30 DVT prophylaxis: per ortho Code Status: full code Family Communication: none- patient oriented Antimicrobials:  Anti-infectives (From admission, onward)   Start     Dose/Rate Route Frequency Ordered Stop   04/21/20 1200  fluconazole (DIFLUCAN) tablet 100 mg        100 mg Oral Daily 04/21/20 1058 04/24/20 0959   04/20/20 1700  ceFAZolin (ANCEF) IVPB 2g/100 mL premix        2 g 200 mL/hr over 30 Minutes Intravenous Every 8 hours 04/20/20 1603     04/17/20 2345  ciprofloxacin (CIPRO)  tablet 500 mg  Status:  Discontinued        500 mg Oral 2 times daily 04/17/20 2245 04/20/20 1602   04/17/20 2345  rifampin (RIFADIN) capsule 300 mg  Status:  Discontinued        300 mg Oral 2 times daily 04/17/20 2245 04/20/20 1602   04/17/20 2345  ceFAZolin (ANCEF) IVPB 2g/100 mL premix        2 g 200 mL/hr over 30 Minutes Intravenous Every 6 hours 04/17/20 2245 04/18/20 0555   04/17/20 1842  gentamicin (GARAMYCIN) injection  Status:  Discontinued          As needed 04/17/20 1843 04/17/20 1918   04/17/20 1730  vancomycin (VANCOCIN) powder  Status:  Discontinued          As needed 04/17/20 1730 04/17/20 1918   04/17/20 1728  Tobramycin Sulfate POWD  Status:  Discontinued          As needed 04/17/20 1730 04/17/20 1918   04/17/20 0930  ceFAZolin (ANCEF) 2-4 GM/100ML-% IVPB       Note to Pharmacy: Ronnell Freshwater   : cabinet override      04/17/20 0930 04/17/20 1204   04/17/20 0600  ceFAZolin (ANCEF) IVPB 2g/100 mL premix        2 g 200 mL/hr over 30 Minutes Intravenous On call to O.R. 04/17/20 0038 04/17/20 1607       Objective: Vitals:   04/20/20 2102 04/21/20 0502 04/21/20 0807 04/21/20 1151  BP: (!)  145/93 (!) 149/67 (!) 159/70 (!) 144/75  Pulse: 84 83 84 85  Resp: 17 18 19 18   Temp: 98.4 F (36.9 C) 97.7 F (36.5 C) 98 F (36.7 C) 98.4 F (36.9 C)  TempSrc:  Oral Oral Oral  SpO2: 95% 100% 99% 100%  Weight:    88.5 kg  Height:        Intake/Output Summary (Last 24 hours) at 04/21/2020 1600 Last data filed at 04/21/2020 1501 Gross per 24 hour  Intake 6673.91 ml  Output --  Net 6673.91 ml   Filed Weights   04/17/20 0903 04/18/20 0016 04/21/20 1151  Weight: 86.2 kg 83.2 kg 88.5 kg    Examination: General exam: Appears comfortable  HEENT: PERRLA, oral mucosa moist, no sclera icterus or thrush Respiratory system: Clear to auscultation. Respiratory effort normal. Cardiovascular system: S1 & S2 heard, regular rate and rhythm Gastrointestinal system: Abdomen soft, non-tender, nondistended. Normal bowel sounds   Central nervous system: Alert and oriented. No focal neurological deficits. Extremities: No cyanosis, clubbing - right knee in immobilizer  Skin: No rashes or ulcers Psychiatry:  Mood & affect appropriate.    Data Reviewed: I have personally reviewed following labs and imaging studies  CBC: Recent Labs  Lab 04/17/20 2248 04/18/20 0436 04/19/20 0348 04/19/20 2004 04/20/20 0547  WBC 13.8* 8.8 5.7  --  5.3  NEUTROABS 11.8*  --   --   --   --   HGB 9.5* 8.9* 7.2* 8.3* 8.7*  HCT 31.3* 28.7* 23.1*  --  28.3*  MCV 78.3* 77.8* 77.5*  --  79.7*  PLT 370 302 258  --  856   Basic Metabolic Panel: Recent Labs  Lab 04/17/20 2248 04/18/20 0436 04/19/20 0348  NA 138 137 139  K 4.9 4.9 3.9  CL 105 105 110  CO2 24 25 25   GLUCOSE 142* 117* 99  BUN 16 17 14   CREATININE 1.06* 1.09* 0.80  CALCIUM 9.7 9.2 8.7*   GFR: Estimated  Creatinine Clearance: 72.9 mL/min (by C-G formula based on SCr of 0.8 mg/dL). Liver Function Tests: Recent Labs  Lab 04/17/20 2248  AST 37  ALT 26  ALKPHOS 91  BILITOT 0.6  PROT 6.2*  ALBUMIN 2.9*   No results for input(s): LIPASE,  AMYLASE in the last 168 hours. No results for input(s): AMMONIA in the last 168 hours. Coagulation Profile: No results for input(s): INR, PROTIME in the last 168 hours. Cardiac Enzymes: No results for input(s): CKTOTAL, CKMB, CKMBINDEX, TROPONINI in the last 168 hours. BNP (last 3 results) No results for input(s): PROBNP in the last 8760 hours. HbA1C: No results for input(s): HGBA1C in the last 72 hours. CBG: No results for input(s): GLUCAP in the last 168 hours. Lipid Profile: No results for input(s): CHOL, HDL, LDLCALC, TRIG, CHOLHDL, LDLDIRECT in the last 72 hours. Thyroid Function Tests: No results for input(s): TSH, T4TOTAL, FREET4, T3FREE, THYROIDAB in the last 72 hours. Anemia Panel: No results for input(s): VITAMINB12, FOLATE, FERRITIN, TIBC, IRON, RETICCTPCT in the last 72 hours. Urine analysis:    Component Value Date/Time   COLORURINE AMBER (A) 04/18/2020 1235   APPEARANCEUR CLOUDY (A) 04/18/2020 1235   LABSPEC 1.023 04/18/2020 1235   PHURINE 5.0 04/18/2020 1235   GLUCOSEU NEGATIVE 04/18/2020 1235   HGBUR MODERATE (A) 04/18/2020 1235   BILIRUBINUR NEGATIVE 04/18/2020 1235   KETONESUR NEGATIVE 04/18/2020 1235   PROTEINUR 30 (A) 04/18/2020 1235   NITRITE NEGATIVE 04/18/2020 1235   LEUKOCYTESUR LARGE (A) 04/18/2020 1235   Sepsis Labs: @LABRCNTIP (procalcitonin:4,lacticidven:4) ) Recent Results (from the past 240 hour(s))  SARS Coronavirus 2 by RT PCR (hospital order, performed in [facility] hospital lab) Nasopharyngeal Nasopharyngeal Swab     Status: None   Collection Time: 04/17/20  9:01 AM   Specimen: Nasopharyngeal Swab  Result Value Ref Range Status   SARS Coronavirus 2 NEGATIVE NEGATIVE Final    Comment: (NOTE) SARS-CoV-2 target nucleic acids are NOT DETECTED.  The SARS-CoV-2 RNA is generally detectable in upper and lower respiratory specimens during the acute phase of infection. The lowest concentration of SARS-CoV-2 viral copies this assay can detect is  250 copies / mL. A negative result does not preclude SARS-CoV-2 infection and should not be used as the sole basis for treatment or other patient management decisions.  A negative result may occur with improper specimen collection / handling, submission of specimen other than nasopharyngeal swab, presence of viral mutation(s) within the areas targeted by this assay, and inadequate number of viral copies (<250 copies / mL). A negative result must be combined with clinical observations, patient history, and epidemiological information.  Fact Sheet for Patients:   StrictlyIdeas.no  Fact Sheet for Healthcare Providers: BankingDealers.co.za  This test is not yet approved or  cleared by the [place] FDA and has been authorized for detection and/or diagnosis of SARS-CoV-2 by FDA under an Emergency Use Authorization (EUA).  This EUA will remain in effect (meaning this test can be used) for the duration of the COVID-19 declaration under Section 564(b)(1) of the Act, 21 U.S.C. section 360bbb-3(b)(1), unless the authorization is terminated or revoked sooner.  Performed at [facility], [address] [place], [place] [address]   Surgical pcr screen     Status: None   Collection Time: 04/17/20  9:38 AM   Specimen: Nasal Mucosa; Nasal Swab  Result Value Ref Range Status   MRSA, PCR NEGATIVE NEGATIVE Final   Staphylococcus aureus NEGATIVE NEGATIVE Final    Comment: (NOTE) The Xpert  SA Assay (FDA approved for NASAL specimens in patients 39 years of age and older), is one component of a comprehensive surveillance program. It is not intended to diagnose infection nor to guide or monitor treatment. Performed at [facility], [place], [place] [address]   Aerobic/Anaerobic Culture w Gram Stain (surgical/deep wound)     Status: None (Preliminary result)   Collection Time: 04/17/20 12:23 PM   Specimen: Ankle   Result Value Ref Range Status   Specimen Description ANKLE  Final   Special Requests POSTERIOR LATERAL RIGHT ANKLE PLATE  Final   Gram Stain   Final    FEW WBC PRESENT, PREDOMINANTLY PMN NO ORGANISMS SEEN    Culture   Final    NO GROWTH 4 DAYS NO ANAEROBES ISOLATED; CULTURE IN PROGRESS FOR 5 DAYS Performed at [facility], [address], [place], [place] [address]    Report Status PENDING  Incomplete  Gram stain     Status: None   Collection Time: 04/17/20  1:00 PM   Specimen: PATH Other; Tissue  Result Value Ref Range Status   Specimen Description ANKLE POSTERIOR LATERAL ANKLE RIGHT PLATE  Final   Special Requests NONE  Final   Gram Stain   Final    NO ORGANISMS SEEN FEW WBC SEEN Performed at [facility], [address], [place], [place] [address]    Report Status 04/17/2020 FINAL  Final  Gram stain     Status: None   Collection Time: 04/17/20  1:16 PM   Specimen: PATH Other; Tissue  Result Value Ref Range Status   Specimen Description ANKLE  Final   Special Requests NONE  Final   Gram Stain   Final    NO ORGANISMS SEEN FEW WBC SEEN Performed at [facility] [address], [place], [place] [address]    Report Status 04/17/2020 FINAL  Final  Aerobic/Anaerobic Culture w Gram Stain (surgical/deep wound)     Status: None (Preliminary result)   Collection Time: 04/17/20  1:16 PM   Specimen: PATH Other; Tissue  Result Value Ref Range Status   Specimen Description   Final    ANKLE Performed at [facility], [address], [place], [place] [address]    Special Requests   Final    NONE Performed at [facility], [address] [place], [place] [address]    Gram Stain   Final    RARE WBC PRESENT, PREDOMINANTLY PMN NO ORGANISMS SEEN    Culture   Final    NO GROWTH 4 DAYS NO ANAEROBES ISOLATED; CULTURE IN PROGRESS FOR 5 DAYS Performed at [facility], [place] [address] [place], [place] [address]    Report Status  PENDING  Incomplete  Gram stain     Status: None   Collection Time: 04/17/20  1:25 PM   Specimen: PATH Other; Tissue  Result Value Ref Range Status   Specimen Description WOUND RIGHT DISTAL FIBULA  Final   Special Requests NONE  Final   Gram Stain   Final    NO ORGANISMS SEEN FEW WBC SEEN Performed at [facility], [address] [place], [place] [address]    Report Status 04/17/2020 FINAL  Final  Aerobic/Anaerobic Culture w Gram Stain (surgical/deep wound)     Status: None (Preliminary result)   Collection Time: 04/17/20  1:25 PM   Specimen: PATH Other; Tissue  Result Value Ref Range Status   Specimen Description  Final    WOUND RIGHT DISTAL FIBULA Performed at [facility], [place] [address] [place], [place] [address]    Special Requests   Final    NONE Performed at [facility], [address] [place], [place] [address]    Gram Stain   Final    FEW WBC PRESENT, PREDOMINANTLY PMN NO ORGANISMS SEEN    Culture   Final    NO GROWTH 4 DAYS NO ANAEROBES ISOLATED; CULTURE IN PROGRESS FOR 5 DAYS Performed at [place] [address] [place], [place] [address]    Report Status PENDING  Incomplete  Gram stain     Status: None   Collection Time: 04/17/20  2:24 PM   Specimen: PATH Other; Tissue  Result Value Ref Range Status   Specimen Description WOUND  Final   Special Requests MEDIAL RIGHT SCREW  Final   Gram Stain   Final    NO ORGANISMS SEEN FEW WBC SEEN RESULT CALLED TO, READ BACK BY AND VERIFIED WITH: ADRIENNE ALLRED AT 6387 ON 04/17/20 BY SS Performed at [facility], [address], [place], [place] [address]    Report Status 04/17/2020 FINAL  Final  Aerobic/Anaerobic Culture w Gram Stain (surgical/deep wound)     Status: None (Preliminary result)   Collection Time: 04/17/20  2:24 PM   Specimen: PATH Other; Tissue  Result Value Ref Range Status   Specimen Description   Final    WOUND Performed at [facility],  [address], [place], [place] [address]    Special Requests   Final    MEDIAL SCREW RIGHT Performed at [facility], [address] [place], [place] [address]    Gram Stain   Final    RARE WBC PRESENT,BOTH PMN AND MONONUCLEAR NO ORGANISMS SEEN    Culture   Final    NO GROWTH 3 DAYS NO ANAEROBES ISOLATED; CULTURE IN PROGRESS FOR 5 DAYS Performed at [facility], [place] [address] [place], [place] [address]    Report Status PENDING  Incomplete  Gram stain     Status: None   Collection Time: 04/17/20  2:29 PM   Specimen: PATH Other; Tissue  Result Value Ref Range Status   Specimen Description WOUND  Final   Special Requests MEDIAL RIGHT ANKLE  Final   Gram Stain   Final    NO ORGANISMS SEEN RARE WBC SEEN RESULT CALLED TO, READ BACK BY AND VERIFIED WITH: ADRIENNE ALLRED AT 6010 ON 04/17/20 BY SS Performed at [facility], [address] [place], [place] [address]    Report Status 04/17/2020 FINAL  Final  Aerobic/Anaerobic Culture w Gram Stain (surgical/deep wound)     Status: None (Preliminary result)   Collection Time: 04/17/20  2:29 PM   Specimen: PATH Other; Tissue  Result Value Ref Range Status   Specimen Description   Final    WOUND Performed at [facility], [address], [place], [place] [address]    Special Requests MEDIAL RIGHT ANKLE  Final   Gram Stain NO WBC SEEN NO ORGANISMS SEEN   Final   Culture   Final    NO GROWTH 3 DAYS NO ANAEROBES ISOLATED; CULTURE IN PROGRESS FOR 5 DAYS Performed at [facility], [place] [address], [place], [place] [address]    Report Status PENDING  Incomplete  Gram stain     Status: None   Collection Time: 04/17/20  3:51 PM  Specimen: PATH Other; Tissue  Result Value Ref Range Status   Specimen Description TISSUE  Final   Special Requests RIGHT KNEE ASPIRATION  Final   Gram Stain   Final    NO ORGANISMS SEEN MANY WBC SEEN Performed at [facility], [address]  [place], [place] [address]    Report Status 04/17/2020 FINAL  Final  Aerobic/Anaerobic Culture w Gram Stain (surgical/deep wound)     Status: None (Preliminary result)   Collection Time: 04/17/20  3:51 PM   Specimen: PATH Other; Tissue  Result Value Ref Range Status   Specimen Description   Final    TISSUE Performed at [facility], [address], [place], [place] [address]    Special Requests   Final     RIGHT KNEE ASPIRATION Performed at [facility], [address] [place], [place] [address]    Gram Stain   Final    MODERATE WBC PRESENT,BOTH PMN AND MONONUCLEAR NO ORGANISMS SEEN    Culture   Final    NO GROWTH 3 DAYS NO ANAEROBES ISOLATED; CULTURE IN PROGRESS FOR 5 DAYS Performed at [place] [address], [place], [place] [address]    Report Status PENDING  Incomplete  Gram stain     Status: None   Collection Time: 04/17/20  3:58 PM   Specimen: PATH Other; Tissue  Result Value Ref Range Status   Specimen Description KNEE KNEE  Final   Special Requests NONE  Final   Gram Stain   Final    NO ORGANISMS SEEN FEW WBC SEEN Performed at [facility], [address], [place], [place] [address]    Report Status 04/17/2020 FINAL  Final  Gram stain     Status: None   Collection Time: 04/17/20  3:58 PM   Specimen: PATH Other; Tissue  Result Value Ref Range Status   Specimen Description WOUND  Final   Special Requests NONE  Final   Gram Stain   Final    NO ORGANISMS SEEN FEW WBC SEEN Performed at [facility], [address], [place], [place] [address]    Report Status 04/17/2020 FINAL  Final  Aerobic/Anaerobic Culture w Gram Stain (surgical/deep wound)     Status: None (Preliminary result)   Collection Time: 04/17/20  3:58 PM   Specimen: PATH Other; Tissue  Result Value Ref Range Status   Specimen Description   Final    WOUND Performed at [facility], [address], [place], [place] [address]    Special  Requests   Final    ANTERIOR KNEE Performed at [facility], [address] [place], [place] [address]    Gram Stain   Final    RARE WBC PRESENT, PREDOMINANTLY MONONUCLEAR NO ORGANISMS SEEN    Culture   Final    NO GROWTH 3 DAYS NO ANAEROBES ISOLATED; CULTURE IN PROGRESS FOR 5 DAYS Performed at [facility], [place] [address] [place], [place] [address]    Report Status PENDING  Incomplete  Aerobic/Anaerobic Culture w Gram Stain (surgical/deep wound)     Status: None (Preliminary result)   Collection Time: 04/17/20  3:58 PM   Specimen: PATH Other; Tissue  Result Value Ref Range Status   Specimen Description   Final    WOUND Performed at [facility], [address] [place], [place] [address]    Special Requests POSTERIOR RECESS RIGHT KNEE  Final   Gram Stain   Final  RARE WBC PRESENT, PREDOMINANTLY PMN NO ORGANISMS SEEN    Culture   Final    NO GROWTH 3 DAYS NO ANAEROBES ISOLATED; CULTURE IN PROGRESS FOR 5 DAYS Performed at [place] [address] [place], [place] [address]    Report Status PENDING  Incomplete  Gram stain     Status: None   Collection Time: 04/17/20  4:24 PM   Specimen: PATH Other; Tissue  Result Value Ref Range Status   Specimen Description TISSUE  Final   Special Requests RIGHT FEMUR UNDER IMPLANT  Final   Gram Stain   Final    NO ORGANISMS SEEN FEW WBC SEEN Performed at [facility], [address] [place], [place] [address]    Report Status 04/17/2020 FINAL  Final  Aerobic/Anaerobic Culture w Gram Stain (surgical/deep wound)     Status: None (Preliminary result)   Collection Time: 04/17/20  4:24 PM   Specimen: PATH Other; Tissue  Result Value Ref Range Status   Specimen Description   Final    TISSUE Performed at [facility], [address] [place], [place] [address]    Special Requests   Final     RIGHT FEMUR UNDER IMPLANT Performed at [facility], [address]  [place], [place] [address]    Gram Stain   Final    RARE WBC PRESENT,BOTH PMN AND MONONUCLEAR NO ORGANISMS SEEN    Culture   Final    NO GROWTH 3 DAYS NO ANAEROBES ISOLATED; CULTURE IN PROGRESS FOR 5 DAYS Performed at [place] [address], [place], [place] [address]    Report Status PENDING  Incomplete  Gram stain     Status: None   Collection Time: 04/17/20  4:56 PM   Specimen: PATH Other; Tissue  Result Value Ref Range Status   Specimen Description TISSUE  Final   Special Requests TIBIAL CANAL  Final   Gram Stain   Final    NO ORGANISMS SEEN MANY WBC SEEN Performed at [facility], [address] [place], [place] [address]    Report Status 04/17/2020 FINAL  Final  Aerobic/Anaerobic Culture w Gram Stain (surgical/deep wound)     Status: None (Preliminary result)   Collection Time: 04/17/20  4:56 PM   Specimen: PATH Other; Tissue  Result Value Ref Range Status   Specimen Description   Final    TISSUE Performed at [facility], [address], [place], [place] [address]    Special Requests   Final    TIBIAL CANAL Performed at [facility], [address] [place], [place] [address]    Gram Stain   Final    ABUNDANT WBC PRESENT,BOTH PMN AND MONONUCLEAR NO ORGANISMS SEEN    Culture   Final    NO GROWTH 3 DAYS NO ANAEROBES ISOLATED; CULTURE IN PROGRESS FOR 5 DAYS Performed at [place] [address], [place], [place] [address]    Report Status PENDING  Incomplete  Culture, Urine     Status: Abnormal   Collection Time: 04/18/20 12:35 PM   Specimen: Urine, Catheterized  Result Value Ref Range Status   Specimen Description   Final    URINE, CATHETERIZED Performed at [facility], [address], [place], [place] [address]    Special Requests   Final    NONE Performed at [facility], [place], [place] [address]    Culture 10,000 COLONIES/mL YEAST (A)  Final   Report Status 04/19/2020  FINAL  Final         Radiology Studies: No results found.    Scheduled Meds: . acidophilus  1 capsule Oral BID  . carvedilol  3.125 mg Oral BID  . Chlorhexidine Gluconate Cloth  6 each Topical Daily  . cyanocobalamin  1,000 mcg Subcutaneous Q1200  . enoxaparin (LOVENOX) injection  30 mg Subcutaneous Q12H  . escitalopram  10 mg Oral Daily  . ferrous sulfate  325 mg Oral BID WC  . fluconazole  100 mg Oral Daily  . loratadine  10 mg Oral Daily  . multivitamin-lutein  1 capsule Oral BID  . pantoprazole  40 mg Oral BID  . polyethylene glycol  17 g Oral Daily  . Ensure Max Protein  11 oz Oral BID   Continuous Infusions: . sodium chloride Stopped (04/21/20 1435)  .  ceFAZolin (ANCEF) IV 200 mL/hr at 04/21/20 1501  . promethazine (PHENERGAN) injection       LOS: 4 days      Debbe Odea, MD [facility] Pager: [email or web address] 04/21/2020, 4:00 PM

## 2020-04-21 NOTE — Progress Notes (Signed)
Physical Therapy Treatment Patient Details Name: Jasmine Morrow MRN: [id number] DOB: [birth year] Today's Date: 04/21/2020    History of Present Illness Pt is a 75 y/o F admitted on 04/16/20 via EMS for re-evaluation of R knee & R ankle. Pt is 2.5 months s/p R knee arthrotomy, I&D, polyethylene exchange & placement of antibiotic eluding beads. Cultures were (+) for methicillin sensitive staph aureus & pt completed course of ancef & has continued with other medications. Pt continues to have serous drainage from medial lesion of R ankle which grew pseudomonas putida. Pt underwent hardware removal from R ankle with I&D & placement of antibiotic beads & R knee arthrotomy, extensive I&D, removal of R TKA implants & placement of antibiotic cement spacer on 04/17/20 by Dr. Marry Guan. Pt is now TDWB RLE in R knee immobilizer. Post-operatively pt developed chest pain & hospitalists were consulted.  PMH: asthma, GERD, OSA on CPAP, anxiety, COPD, multiple lung nodules, hx of ICU admission for GI bleed with hemorrhagic shock.    PT Comments    PT/OT co treat 2/2 to pt's abilities and need for +2 assist for OOB activity. Upon entering room, pt was crying in the dark, down about current situation. She agrees to PT/OT co-treat with encouragement. Was able to exit L side of bed with min assist of one but required heavy use of bed rails + increased time . Stood 2 x from elevated bed height with +2 mod assist then +2 min assist. Pt has anxiety throughout session that limits progression. Pt tolerated there ex and ADLs. Therapist did discuss DC disposition. She has had bad experience in past at rehab. Therapists discussed that pt would benefit from rehab prior to returning home however pt states she would like to talk to husband first. If pt decides to DC to home from hospital, recommend hoyer lift for home use 2/2 to currently requiring +2 assist for safety to stand. She was repositioned in bed post session with call bell in  reach and RN staff aware of pt's abilities.    Follow Up Recommendations  SNF;Follow surgeon's recommendation for DC plan and follow-up therapies;Supervision/Assistance - 24 hour;Supervision for mobility/OOB (Recommend SNF however pt currently unwilling to go at this time. said she will discuss with husband and let us know. Will most likely need hoyer lift for home transfers due to wt bearing restrictions.)     Equipment Recommendations  Other (comment) (hoyer lift if pt is planning to DC home from acute hospital stay.)    Recommendations for Other Services       Precautions / Restrictions Precautions Precautions: Fall Required Braces or Orthoses: Other Brace Other Brace: R hinged knee brace locked in extension Restrictions Weight Bearing Restrictions: Yes RLE Weight Bearing: Touchdown weight bearing    Mobility  Bed Mobility Overal bed mobility: Needs Assistance Bed Mobility: Supine to Sit;Sit to Supine Rolling: Min assist   Supine to sit: Min assist;HOB elevated Sit to supine: Min assist   General bed mobility comments: Pt required heavy use of bed rails and increased time to perform. Min assist to fully achieve EOB short sit. Min assist to return BLes into bed after OOB activity    Transfers Overall transfer level: Needs assistance Equipment used: Rolling walker (2 wheeled) Transfers: Sit to/from Stand Sit to Stand: Min assist;Mod assist;+2 safety/equipment;From elevated surface         General transfer comment: Pt stood 2 x from elevated bed height with +2 assist for safety. Mod assist +2 on furst  trial and min assist +2 for secound. Pt had RLE on therpist foot and was able to maintain proper NWB/TDWB status  Ambulation/Gait             General Gait Details: unsafe at this time   Stairs             Wheelchair Mobility    Modified Rankin (Stroke Patients Only)       Balance Overall balance assessment: Needs assistance Sitting-balance support:  No upper extremity supported;Feet supported Sitting balance-Leahy Scale: Good Sitting balance - Comments: Able to maintain balance at EOB for ~20 to perform ADLs and seated therex   Standing balance support: Bilateral upper extremity supported;During functional activity Standing balance-Leahy Scale: Poor Standing balance comment: +2 assist currently to safely maintain standing while maintaining proper wt bearing for ~ 20 secound                            Cognition Arousal/Alertness: Awake/alert Behavior During Therapy: Anxious Overall Cognitive Status: Within Functional Limits for tasks assessed                                 General Comments: Pt tearful throughout session, stating that she is discouraged by medical course and "hates being a burden"; OT & PT provided comfort throughout. Pt initially deferred OOB mobility, however agreeable to sit<>stand transfers following success with seated ADLs and MAX encouragement from OT & PT      Exercises General Exercises - Upper Extremity Shoulder Flexion: Strengthening;Both;10 reps;Seated Other Exercises Other Exercises: Role of OT, POC, and d/c recommendation    General Comments        Pertinent Vitals/Pain Pain Assessment: 0-10 Pain Score: 6  Faces Pain Scale: Hurts little more Pain Location: RLE Pain Descriptors / Indicators: Guarding;Grimacing Pain Intervention(s): Limited activity within patient's tolerance;Monitored during session;Premedicated before session;Repositioned    Home Living                      Prior Function            PT Goals (current goals can now be found in the care plan section) Acute Rehab PT Goals Patient Stated Goal: To get back to baseline Progress towards PT goals: Progressing toward goals    Frequency    7X/week      PT Plan Discharge plan needs to be updated    Co-evaluation PT/OT/SLP Co-Evaluation/Treatment: Yes Reason for Co-Treatment:  Complexity of the patient's impairments (multi-system involvement);For patient/therapist safety;To address functional/ADL transfers;Necessary to address cognition/behavior during functional activity PT goals addressed during session: Mobility/safety with mobility;Balance;Proper use of DME;Strengthening/ROM OT goals addressed during session: ADL's and self-care      AM-PAC PT "6 Clicks" Mobility   Outcome Measure  Help needed turning from your back to your side while in a flat bed without using bedrails?: A Little Help needed moving from lying on your back to sitting on the side of a flat bed without using bedrails?: A Lot Help needed moving to and from a bed to a chair (including a wheelchair)?: Total Help needed standing up from a chair using your arms (e.g., wheelchair or bedside chair)?: Total Help needed to walk in hospital room?: Total Help needed climbing 3-5 steps with a railing? : Total 6 Click Score: 9    End of Session Equipment Utilized During Treatment: Gait belt;Other (comment) (ROM  knee brace locked in extension) Activity Tolerance: Patient tolerated treatment well Patient left: in bed;with call bell/phone within reach;with bed alarm set Nurse Communication: Mobility status PT Visit Diagnosis: Muscle weakness (generalized) (M62.81);Difficulty in walking, not elsewhere classified (R26.2);Other abnormalities of gait and mobility (R26.89)     Time: 4799-8721 PT Time Calculation (min) (ACUTE ONLY): 38 min  Charges:  $Therapeutic Activity: 8-22 mins                     Julaine Fusi PTA 04/21/20, 3:20 PM

## 2020-04-21 NOTE — Care Management Important Message (Signed)
Important Message  Patient Details  Name: Jasmine Morrow MRN: [id number] Date of Birth: 1945-04-29   Medicare Important Message Given:  Yes     Dannette Barbara 04/21/2020, 10:58 AM

## 2020-04-22 DIAGNOSIS — T8450XA Infection and inflammatory reaction due to unspecified internal joint prosthesis, initial encounter: Secondary | ICD-10-CM

## 2020-04-22 LAB — AEROBIC/ANAEROBIC CULTURE W GRAM STAIN (SURGICAL/DEEP WOUND)
Culture: NO GROWTH
Culture: NO GROWTH
Culture: NO GROWTH

## 2020-04-22 LAB — TYPE AND SCREEN
ABO/RH(D): O POS
Antibody Screen: POSITIVE
Donor AG Type: NEGATIVE
Unit division: 0
Unit division: 0

## 2020-04-22 LAB — BPAM RBC
Blood Product Expiration Date: 202204062359
Blood Product Expiration Date: 202204072359
ISSUE DATE / TIME: 202203201600
Unit Type and Rh: 5100
Unit Type and Rh: 5100

## 2020-04-22 MED ORDER — AMOXICILLIN-POT CLAVULANATE 875-125 MG PO TABS: 1.0000 | ORAL_TABLET | Freq: Two times a day (BID) | ORAL | 0 refills | Status: DC

## 2020-04-22 MED ORDER — RISAQUAD PO CAPS: 1.0000 | ORAL_CAPSULE | Freq: Two times a day (BID) | ORAL | 0 refills | Status: DC

## 2020-04-22 MED ORDER — VITAMIN B-12 1000 MCG PO TABS: 1000.0000 ug | ORAL_TABLET | Freq: Every day | ORAL | 0 refills | Status: DC

## 2020-04-22 MED ORDER — LACTULOSE 10 GM/15ML PO SOLN
20.0000 g | Freq: Once | ORAL | Status: AC
  Administered 2020-04-22: 20 g via ORAL
  Filled 2020-04-22: qty 30

## 2020-04-22 MED ORDER — ENOXAPARIN SODIUM 40 MG/0.4ML ~~LOC~~ SOLN: 40.0000 mg | SUBCUTANEOUS | 0 refills | Status: DC

## 2020-04-22 MED ORDER — ONDANSETRON HCL 4 MG PO TABS: 4.0000 mg | ORAL_TABLET | Freq: Three times a day (TID) | ORAL | Status: DC | PRN

## 2020-04-22 MED ORDER — FLUCONAZOLE 100 MG PO TABS: 100.0000 mg | ORAL_TABLET | Freq: Every day | ORAL | 0 refills | Status: DC

## 2020-04-22 MED ORDER — LACTULOSE 20 GM/30ML PO SOLN: 20.0000 g | Freq: Every day | ORAL | 0 refills | Status: DC | PRN

## 2020-04-22 MED ORDER — FERROUS SULFATE 325 (65 FE) MG PO TABS: 325.0000 mg | ORAL_TABLET | Freq: Two times a day (BID) | ORAL | 0 refills | Status: DC

## 2020-04-22 NOTE — Progress Notes (Signed)
ID Pt doing better No dysuria C/o acid reflux  Patient Vitals for the past 24 hrs:  BP Temp Temp src Pulse Resp SpO2 Weight  04/22/20 1102 (!) 143/69 98.1 F (36.7 C) Oral 71 17 98 % -  04/22/20 0739 (!) 164/81 97.6 F (36.4 C) Oral 75 17 98 % 88.9 kg  04/22/20 0304 (!) 144/68 97.8 F (36.6 C) Oral 80 20 99 % -  04/21/20 1934 132/68 98.2 F (36.8 C) Oral 82 20 100 % -  04/21/20 1628 (!) 146/71 98.7 F (37.1 C) Oral 80 18 100 % -   O/e awake and alert Chest b/l air entry [id number] abd soft CNS non focal Rt leg dressing  Labs CBC Latest Ref Rng & Units 04/20/2020 04/19/2020 04/19/2020  WBC 4.0 - 10.5 K/uL 5.3 - 5.7  Hemoglobin 12.0 - 15.0 g/dL 8.7(L) 8.3(L) 7.2(L)  Hematocrit 36.0 - 46.0 % 28.3(L) - 23.1(L)  Platelets 150 - 400 K/uL 249 - 258    CMP Latest Ref Rng & Units 04/19/2020 04/18/2020 04/17/2020  Glucose 70 - 99 mg/dL 99 117(H) 142(H)  BUN 8 - 23 mg/dL 14 17 16   Creatinine 0.44 - 1.00 mg/dL 0.80 1.09(H) 1.06(H)  Sodium 135 - 145 mmol/L 139 137 138  Potassium 3.5 - 5.1 mmol/L 3.9 4.9 4.9  Chloride 98 - 111 mmol/L 110 105 105  CO2 22 - 32 mmol/L 25 25 24   Calcium 8.9 - 10.3 mg/dL 8.7(L) 9.2 9.7  Total Protein 6.5 - 8.1 g/dL - - 6.2(L)  Total Bilirubin 0.3 - 1.2 mg/dL - - 0.6  Alkaline Phos 38 - 126 U/L - - 91  AST 15 - 41 U/L - - 37  ALT 0 - 44 U/L - - 26    Micro Multiple tissue cultures from knee and ankle are all negative  Impression/recommendation  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  Initially underwent debridement and polyexchange on 01/25/2020 and got 6 weeks of IV antibiotics and  was on oral antibiotics for long-term.  Because the wound was not healing she has been brought back for removal of all the hardware. History of right ankle fracture with ORIF January 2021.Marland Kitchen  Complicated by staph aureus infection.  Treated with IV biotics.  Sinus tract of the right ankle.  Some hardware was removed on 11/12/2019.  But is still continue to be a nonhealing wound.   Hence she was brought back for hardware removal on 04/17/2020.  Multiple cultures taken during the surgery has all been negative.  She is currently on IV cefazolin.  On discharge will change her to PO Augmentin until 05/30/20  Candida UTI.  On fluconazole.  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

## 2020-04-22 NOTE — Progress Notes (Signed)
PT Cancellation Note  Patient Details Name: Jasmine Morrow MRN: [id number] DOB: 1945/11/02   Cancelled Treatment:     PT attempt. Pt currently on bed pan requesting that therapist return later this afternoon. Will continue to follow and progress as able per POC. Will return this PM.    Willette Pa 04/22/2020, 10:19 AM

## 2020-04-22 NOTE — Progress Notes (Signed)
Physical Therapy Treatment Patient Details Name: Jasmine Morrow MRN: [id number] DOB: 08/14/1945 Today's Date: 04/22/2020    History of Present Illness Pt is a 75 y/o F admitted on 04/16/20 via EMS for re-evaluation of R knee & R ankle. Pt is 2.5 months s/p R knee arthrotomy, I&D, polyethylene exchange & placement of antibiotic eluding beads. Cultures were (+) for methicillin sensitive staph aureus & pt completed course of ancef & has continued with other medications. Pt continues to have serous drainage from medial lesion of R ankle which grew pseudomonas putida. Pt underwent hardware removal from R ankle with I&D & placement of antibiotic beads & R knee arthrotomy, extensive I&D, removal of R TKA implants & placement of antibiotic cement spacer on 04/17/20 by Dr. Marry Guan. Pt is now TDWB RLE in R knee immobilizer. Post-operatively pt developed chest pain & hospitalists were consulted.  PMH: asthma, GERD, OSA on CPAP, anxiety, COPD, multiple lung nodules, hx of ICU admission for GI bleed with hemorrhagic shock.    PT Comments    Pt was supine in bed with ROM knee brace in place and polar care running. She is in much more pleasant mood." I'm staying tonight." She agrees to OOB activity and  getting from bed to chair and return via pivot transfer. Continues to required +2 assistance for standing activity to safely maintain TDWB RLE with knee brace locked in extension. She did pivot to recliner and back to bed with assistance of 2 and constant vcs for limiting wt bearing. Pt will greatly benefit from PT services going forward. Follow surgeons recommendation for DC. Pt currently refusing rehab. If pt is to DC home, recommend hoyer lift for safe transfers. Pt was long sitting in bed with call bell in reach and polar care re-applied. Acute PT will; continue to follow and progress as able per POC.    Follow Up Recommendations  Follow surgeon's recommendation for DC plan and follow-up  therapies;Supervision/Assistance - 24 hour;Other (comment) (Pt planning to DC home. Does not want to go to rehab.)     Equipment Recommendations  Other (comment) (hoyer lift for home use)    Recommendations for Other Services       Precautions / Restrictions Precautions Precautions: Fall Required Braces or Orthoses: Other Brace Other Brace: R hinged knee brace locked in extension Restrictions Weight Bearing Restrictions: Yes RLE Weight Bearing: Touchdown weight bearing    Mobility  Bed Mobility Overal bed mobility: Needs Assistance Bed Mobility: Supine to Sit;Sit to Supine     Supine to sit: Min assist;HOB elevated Sit to supine: Min assist   General bed mobility comments: MIn assist to exit and re-enter bed. INcreased time and vcs for technique    Transfers Overall transfer level: Needs assistance Equipment used: Rolling walker (2 wheeled) Transfers: Sit to/from Stand Sit to Stand: +2 safety/equipment;From elevated surface;Min assist;Mod assist         General transfer comment: min-mod of two to stand at EOB 2 x. She was able to progress to pivot to recliner with max vcs/assistance  for maintaining TDWB  Ambulation/Gait             General Gait Details: was able to pivot but unable to "hop"      Balance Overall balance assessment: Needs assistance Sitting-balance support: No upper extremity supported;Feet supported Sitting balance-Leahy Scale: Good Sitting balance - Comments: no LOB seated EOB throughout session   Standing balance support: Bilateral upper extremity supported;During functional activity Standing balance-Leahy Scale: Poor Standing balance  comment: Continues to require +2 assist for safety for all standing activity       Cognition Arousal/Alertness: Awake/alert Behavior During Therapy: Anxious Overall Cognitive Status: Within Functional Limits for tasks assessed      General Comments: Pt is A and O x 4. states she will not be DC  today. She has much improve spirits today and was willing to trial OOB activity             Pertinent Vitals/Pain Pain Assessment: 0-10 Pain Score: 4  Faces Pain Scale: Hurts a little bit Pain Location: RLE Pain Descriptors / Indicators: Guarding;Grimacing Pain Intervention(s): Limited activity within patient's tolerance;Monitored during session;Premedicated before session;Repositioned;Ice applied     PT Goals (current goals can now be found in the care plan section) Acute Rehab PT Goals Patient Stated Goal: To get back to baseline Progress towards PT goals: Progressing toward goals    Frequency    7X/week      PT Plan Current plan remains appropriate    Co-evaluation     PT goals addressed during session: Mobility/safety with mobility;Balance;Proper use of DME;Strengthening/ROM        AM-PAC PT "6 Clicks" Mobility   Outcome Measure  Help needed turning from your back to your side while in a flat bed without using bedrails?: A Little Help needed moving from lying on your back to sitting on the side of a flat bed without using bedrails?: A Lot Help needed moving to and from a bed to a chair (including a wheelchair)?: Total Help needed standing up from a chair using your arms (e.g., wheelchair or bedside chair)?: Total Help needed to walk in hospital room?: Total Help needed climbing 3-5 steps with a railing? : Total 6 Click Score: 9    End of Session Equipment Utilized During Treatment: Gait belt;Other (comment) (R ROM knee brace with polar care) Activity Tolerance: Patient tolerated treatment well Patient left: in bed;with call bell/phone within reach;with bed alarm set Nurse Communication: Mobility status PT Visit Diagnosis: Muscle weakness (generalized) (M62.81);Difficulty in walking, not elsewhere classified (R26.2);Other abnormalities of gait and mobility (R26.89)     Time: 6962-9528 PT Time Calculation (min) (ACUTE ONLY): 23 min  Charges:   $Therapeutic Activity: 23-37 mins                     Julaine Fusi PTA 04/22/20, 4:47 PM

## 2020-04-22 NOTE — Discharge Summary (Addendum)
Physician Discharge Summary  Patient ID: Jasmine Morrow MRN: [id number] DOB/AGE: 1945-07-08 75 y.o.  Admit date: 04/17/2020 Discharge date: 04/22/2020  Admission Diagnoses:  Discharge Diagnoses:  Principal Problem:   Chest pain Active Problems:   Extrinsic asthma   OSA on CPAP   Multiple lung nodules   Pressure injury of skin   COPD (chronic obstructive pulmonary disease) (HCC)   GERD (gastroesophageal reflux disease)   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HCC)   Anxiety   History of GI diverticular bleed   Discharged Condition: good  Hospital Course:  Jasmine Morrow is an 75 y.o. female with a history of asthma, GERD, OSA on CPAP, anxiety, COPD, multiple lung nodules, as well as history of ICU admission for GI bleed with hemorrhagic shock who is being seen in consultation for chest pain which she developed postoperatively.  Consultation was requested by anesthesiologist Dr. Kayleen Memos.  Patient underwent prolonged orthopedic surgery lasting around 7 hours for removal of hardware from right ankle and right knee indicated for chronic right knee periprosthetic infection.  2600 mils of IV fluid was administered intraoperatively and blood loss was 150 mils.  Her chest pain was deemed to be noncardiac, she had a normal BNP and a troponin.  2D echo did not show any wall motion abnormality. Since removing of hardware from surgery, patient has been doing well.  She has been cleared for discharge from orthopedics.  At this point, she is medically stable to be discharged.  #1.  Right ankle and right knee complicated infection status post hardware removal. Patient has been seen by infectious disease, all cultures came back negative.  At this point, continue 2 weeks of Augmentin per recommendation from ID. Prophylaxis Lovenox for 2 weeks  #2.  Candida UTI. Continue 1 more day of Diflucan, to complete 3-day course.  #3.  Chest pain.  Noncardiac.  Condition has resolved  4.  Nausea vomiting  and poor oral intake.  Diarrhea Patient appetite seems to be improving.  No longer has any diarrhea.  5.  Acute kidney injury P Renal function improved.  6.  Hypotension. Condition had improved, currently on Coreg without significant drop of blood pressure.  7.  Iron and B12 deficient anemia. Continue iron and B12 supplement.  8.  COPD Stable.  Discussed with ID, patient wished to stay in hospital for another day.  We will cancel discharge and discharge tomorrow.    Consults: orthopedic surgery  ID Significant Diagnostic Studies:  Treatments: Antibiotics, remove hardware from knee.  Discharge Exam: Blood pressure (!) 143/69, pulse 71, temperature 98.1 F (36.7 C), temperature source Oral, resp. rate 17, height 5\' 8"  (1.727 m), weight 88.9 kg, SpO2 98 %. General appearance: alert and cooperative Resp: clear to auscultation bilaterally Cardio: regular rate and rhythm, S1, S2 normal, no murmur, click, rub or gallop GI: soft, non-tender; bowel sounds normal; no masses,  no organomegaly Extremities: extremities normal, atraumatic, no cyanosis or edema  Disposition: Discharge disposition: 01-Home or Self Care       Discharge Instructions    Diet - low sodium heart healthy   Complete by: As directed    Discharge wound care:   Complete by: As directed    Follow with orthepedics   Increase activity slowly   Complete by: As directed      Allergies as of 04/22/2020      Reactions   Celecoxib Nausea Only   Doxycycline    GI Upset   Hydrocodone-acetaminophen Nausea And Vomiting  Meloxicam Nausea Only   Morphine Sulfate Er Beads Itching   Naproxen Swelling   Oxycodone Other (See Comments)   disorientation   Rofecoxib Nausea Only   (vioxx)   Singulair [montelukast] Swelling   Gland swelling   Sulfa Antibiotics Other (See Comments)   Reaction: unknown   Tape Dermatitis   Nickel Rash      Medication List    STOP taking these medications   ciprofloxacin 500 MG  tablet Commonly known as: CIPRO   rifampin 300 MG capsule Commonly known as: RIFADIN     TAKE these medications   acidophilus Caps capsule Take 1 capsule by mouth 2 (two) times daily.   alum & mag hydroxide-simeth 200-200-20 MG/5ML suspension Commonly known as: MAALOX/MYLANTA Take 30 mLs by mouth every 4 (four) hours as needed for indigestion.   amoxicillin-clavulanate 875-125 MG tablet Commonly known as: Augmentin Take 1 tablet by mouth 2 (two) times daily for 14 days.   aspirin EC 81 MG tablet Take 81 mg by mouth daily before supper. Swallow whole.   CALAZIME SKIN PROTECTANT EX Apply 1 application topically 4 (four) times daily as needed (diaper changes).   Calcium-Vitamin D 600-200 MG-UNIT tablet Take 2 tablets by mouth daily.   carvedilol 3.125 MG tablet Commonly known as: COREG Take 3.125 mg by mouth 2 (two) times daily.   cetirizine 10 MG tablet Commonly known as: ZYRTEC Take 10 mg by mouth in the morning.   enoxaparin 40 MG/0.4ML injection Commonly known as: LOVENOX Inject 0.4 mLs (40 mg total) into the skin daily for 12 days.   escitalopram 10 MG tablet Commonly known as: LEXAPRO Take 10 mg by mouth daily.   ferrous sulfate 325 (65 FE) MG tablet Take 1 tablet (325 mg total) by mouth 2 (two) times daily with a meal.   fluconazole 100 MG tablet Commonly known as: DIFLUCAN Take 1 tablet (100 mg total) by mouth daily for 1 day. Start taking on: April 23, 2020   hydroxypropyl methylcellulose / hypromellose 2.5 % ophthalmic solution Commonly known as: ISOPTO TEARS / GONIOVISC Place 1 drop into both eyes 3 (three) times daily as needed for dry eyes.   multivitamin with minerals Tabs tablet Take 1 tablet by mouth daily.   nystatin ointment Commonly known as: MYCOSTATIN Apply 1 application topically daily as needed for irritation.   ondansetron 4 MG tablet Commonly known as: ZOFRAN Take 1 tablet (4 mg total) by mouth 2 (two) times daily as needed for  nausea or vomiting.   pantoprazole 40 MG tablet Commonly known as: PROTONIX Take 40 mg by mouth daily.   polyethylene glycol 17 g packet Commonly known as: MiraLax Take 17 g by mouth daily as needed. What changed: when to take this   potassium chloride 10 MEQ tablet Commonly known as: KLOR-CON Take 10 mEq by mouth daily at 12 noon.   PRESERVISION AREDS 2 PO Take 1 tablet by mouth 2 (two) times daily.   traMADol 50 MG tablet Commonly known as: Ultram Take 1 tablet (50 mg total) by mouth every 6 (six) hours as needed. What changed: reasons to take this   vitamin B-12 1000 MCG tablet Commonly known as: CYANOCOBALAMIN Take 1 tablet (1,000 mcg total) by mouth daily.            Durable Medical Equipment  (From admission, onward)         Start     Ordered   04/22/20 1034  For home use only DME Other see  comment  Once       Comments: Hoyar lift  Question:  Length of Need  Answer:  6 Months   04/22/20 1035   04/17/20 2245  DME Walker rolling  Once       Question:  Patient needs a walker to treat with the following condition  Answer:  Total knee replacement status   04/17/20 2245   04/17/20 2245  DME Bedside commode  Once       Question:  Patient needs a bedside commode to treat with the following condition  Answer:  Total knee replacement status   04/17/20 2245           Discharge Care Instructions  (From admission, onward)         Start     Ordered   04/22/20 0000  Discharge wound care:       Comments: Follow with orthepedics   04/22/20 1237          Follow-up Information    [facility] On 04/24/2020.   Specialty: Orthopedic Surgery Why: at10:45am Contact information: [place] [address] [phone]        Dereck Leep, MD On 06/04/2020.   Specialty: Orthopedic Surgery Why: at 9:45am Contact information: [place] [address] [phone]        Rusty Aus, MD Follow up in 1 week(s).   Specialty: Internal Medicine Contact information: [place] [address] [phone]              34 minutes Signed: Sharen Hones 04/22/2020, 12:37 PM

## 2020-04-22 NOTE — Progress Notes (Signed)
Patient had a total of 5 bowel movements during shift after given one dose of lactulose during AM medication pass. Last two bowel movements was noted on Monday 04/20/20. Patient stated she needed to be "cleaned" out more.

## 2020-04-22 NOTE — Progress Notes (Signed)
  Subjective: 5 Days Post-Op Procedure(s) (LRB): HARDWARE REMOVAL FROM RIGHT ANKLE AND RIGHT KNEE; IMPLANT OF CEMENT SPACERS IN RIGHT KNEE (Right) Patient reports pain as well-controlled.   Patient reports minimal nausea today. Plan is to go Home after hospital stay. Negative for chest pain and shortness of breath Fever: no   Objective: Vital signs in last 24 hours: Temp:  [97.5 F (36.4 C)-98.2 F (36.8 C)] 97.5 F (36.4 C) (03/23 1640) Pulse Rate:  [71-82] 77 (03/23 1640) Resp:  [17-20] 17 (03/23 1640) BP: (132-164)/(66-81) 148/66 (03/23 1640) SpO2:  [98 %-100 %] 100 % (03/23 1640) Weight:  [88.9 kg] 88.9 kg (03/23 0739)  Intake/Output from previous day:  Intake/Output Summary (Last 24 hours) at 04/22/2020 1919 Last data filed at 04/22/2020 2446 Gross per 24 hour  Intake 1413.79 ml  Output 1 ml  Net 1412.79 ml    Intake/Output this shift: No intake/output data recorded.  Labs: Recent Labs    04/19/20 2004 04/20/20 0547  HGB 8.3* 8.7*   Recent Labs    04/20/20 0547  WBC 5.3  RBC 3.55*  HCT 28.3*  PLT 249   No results for input(s): NA, K, CL, CO2, BUN, CREATININE, GLUCOSE, CALCIUM in the last 72 hours. No results for input(s): LABPT, INR in the last 72 hours.   EXAM General - Patient is Alert, Appropriate and Oriented Extremity - knee immobilizer in place. PolarCare in place  Dressing/Incision -mild continued drainage from medial ankle wound, otherwise no significant drainage noted  Motor Function - intact, moving foot and toes well on exam.   Assessment/Plan: 5 Days Post-Op Procedure(s) (LRB): HARDWARE REMOVAL FROM RIGHT ANKLE AND RIGHT KNEE; IMPLANT OF CEMENT SPACERS IN RIGHT KNEE (Right) Principal Problem:   Chest pain Active Problems:   Extrinsic asthma   OSA on CPAP   Multiple lung nodules   Pressure injury of skin   COPD (chronic obstructive pulmonary disease) (HCC)   GERD (gastroesophageal reflux disease)   Chronic infection of prosthetic  knee (HCC)   Anxiety   History of GI diverticular bleed  Estimated body mass index is 29.8 kg/m as calculated from the following:   Height as of this encounter: 5\' 8"  (1.727 m).   Weight as of this encounter: 88.9 kg. Advance diet Up with therapy Plan for discharge tomorrow  Fresh gauze dressing applied to medial ankle. Fresh honeycomb applied to lateral ankle incision.  Discussed with patient discharge. Patient states she will be ok with discharge tomorrow. Will discuss appropriate follow-up with attending as patient currently has scheduled post-op visit on Friday.   DVT Prophylaxis - Lovenox, Ted hose and foot pumps Touchdown Weight-Bearing  to left leg  Cassell Smiles, PA-C [facility] Orthopaedic Surgery 04/22/2020, 7:19 PM

## 2020-04-23 ENCOUNTER — Telehealth: Payer: Self-pay

## 2020-04-23 DIAGNOSIS — J449 Chronic obstructive pulmonary disease, unspecified: Secondary | ICD-10-CM

## 2020-04-23 LAB — AEROBIC/ANAEROBIC CULTURE W GRAM STAIN (SURGICAL/DEEP WOUND)
Culture: NO GROWTH
Culture: NO GROWTH
Culture: NO GROWTH
Culture: NO GROWTH
Culture: NO GROWTH
Culture: NO GROWTH
Culture: NO GROWTH
Gram Stain: NONE SEEN

## 2020-04-23 LAB — CBC
HCT: 26.9 % — ABNORMAL LOW (ref 36.0–46.0)
Hemoglobin: 8.5 g/dL — ABNORMAL LOW (ref 12.0–15.0)
MCH: 25 pg — ABNORMAL LOW (ref 26.0–34.0)
MCHC: 31.6 g/dL (ref 30.0–36.0)
MCV: 79.1 fL — ABNORMAL LOW (ref 80.0–100.0)
Platelets: 286 10*3/uL (ref 150–400)
RBC: 3.4 MIL/uL — ABNORMAL LOW (ref 3.87–5.11)
RDW: 17.7 % — ABNORMAL HIGH (ref 11.5–15.5)
WBC: 5.2 10*3/uL (ref 4.0–10.5)
nRBC: 0 % (ref 0.0–0.2)

## 2020-04-23 MED ORDER — AMOXICILLIN-POT CLAVULANATE 875-125 MG PO TABS: 1.0000 | ORAL_TABLET | Freq: Two times a day (BID) | ORAL | 0 refills | Status: AC

## 2020-04-23 MED ORDER — TRAMADOL HCL 50 MG PO TABS
50.0000 mg | ORAL_TABLET | ORAL | 0 refills | Status: AC | PRN
Start: 2020-04-23 — End: ?

## 2020-04-23 MED ORDER — FLUCONAZOLE 100 MG PO TABS: 100.0000 mg | ORAL_TABLET | Freq: Every day | ORAL | 0 refills | Status: AC

## 2020-04-23 MED ORDER — FLUCONAZOLE 100 MG PO TABS: 100.0000 mg | ORAL_TABLET | Freq: Every day | ORAL | 0 refills | Status: DC

## 2020-04-23 NOTE — Discharge Summary (Signed)
Physician Discharge Summary  Patient ID: Jasmine Morrow MRN: [id number] DOB/AGE: [birth year] 75 y.o.  Admit date: 04/17/2020 Discharge date: 04/23/2020  Admission Diagnoses:  Discharge Diagnoses:  Principal Problem:   Chest pain Active Problems:   Extrinsic asthma   OSA on CPAP   Multiple lung nodules   Pressure injury of skin   COPD (chronic obstructive pulmonary disease) (HCC)   GERD (gastroesophageal reflux disease)   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HCC)   Anxiety   History of GI diverticular bleed   Discharged Condition: good  Hospital Course:  Jasmine Ammons Johnsonis an 75 y.o.femalewith a history of asthma, GERD, OSA on CPAP, anxiety, COPD, multiple lung nodules, as well as history of ICU admission for GI bleed with hemorrhagic shockwho isbeing seen in consultation for chest pain which she developed postoperatively. Consultation was requested by anesthesiologist Dr. Kayleen Memos. Patient underwent prolonged orthopedic surgery lasting around7 hours for removal of hardware from right ankle and right knee indicated for chronic right knee periprosthetic infection. 2600 mils of IVfluid was administered intraoperatively and blood loss was 150 mils.  Her chest pain was deemed to be noncardiac, she had a normal BNP and a troponin.  2D echo did not show any wall motion abnormality. Since removing of hardware from surgery, patient has been doing well.  She has been cleared for discharge from orthopedics.  At this point, she is medically stable to be discharged.  #1.  Right ankle and right knee complicated infection status post hardware removal. Patient has been seen by infectious disease, all cultures came back negative.  At this point, continue Augmentin until 3/30 per recommendation from ID. Prophylaxis Lovenox for 2 weeks   #2.  Candida UTI. Has some vaginal and urinary symptoms.  Per ID recommendation, will be given another 7 days of Diflucan.  #3.  Chest pain.  Noncardiac.   Condition has resolved  4.  Nausea vomiting and poor oral intake.  Diarrhea Patient appetite seems to be improving.  No longer has any diarrhea.  5.  Acute kidney injury Renal function improved.  6.  Hypotension. Condition had improved, currently on Coreg without significant drop of blood pressure.  7.  Iron and B12 deficient anemia. Continue iron and B12 supplement.  8.  COPD Stable.  It was uncomfortable to go home yesterday, after discussed with orthopedics, ID, patient is medically stable to be discharged today.  Consults: ID and orthopedic surgery  Significant Diagnostic Studies:   Treatments: Antibiotics, removing hardware from knee.  Discharge Exam: Blood pressure 138/60, pulse 72, temperature 98 F (36.7 C), resp. rate 18, height 5\' 8"  (1.727 m), weight 88.9 kg, SpO2 99 %. General appearance: alert and cooperative Resp: clear to auscultation bilaterally Cardio: regular rate and rhythm, S1, S2 normal, no murmur, click, rub or gallop GI: soft, non-tender; bowel sounds normal; no masses,  no organomegaly Extremities: extremities normal, atraumatic, no cyanosis or edema  Disposition: Discharge disposition: 01-Home or Self Care       Discharge Instructions    Diet - low sodium heart healthy   Complete by: As directed    Diet - low sodium heart healthy   Complete by: As directed    Discharge wound care:   Complete by: As directed    Follow with orthepedics   Discharge wound care:   Complete by: As directed    Follow with orthopedics   Increase activity slowly   Complete by: As directed    Increase activity slowly   Complete by:  As directed      Allergies as of 04/23/2020      Reactions   Celecoxib Nausea Only   Doxycycline    GI Upset   Hydrocodone-acetaminophen Nausea And Vomiting   Meloxicam Nausea Only   Morphine Sulfate Er Beads Itching   Naproxen Swelling   Oxycodone Other (See Comments)   disorientation   Rofecoxib Nausea Only    (vioxx)   Singulair [montelukast] Swelling   Gland swelling   Sulfa Antibiotics Other (See Comments)   Reaction: unknown   Tape Dermatitis   Nickel Rash      Medication List    STOP taking these medications   ciprofloxacin 500 MG tablet Commonly known as: CIPRO   rifampin 300 MG capsule Commonly known as: RIFADIN     TAKE these medications   acidophilus Caps capsule Take 1 capsule by mouth 2 (two) times daily.   alum & mag hydroxide-simeth 200-200-20 MG/5ML suspension Commonly known as: MAALOX/MYLANTA Take 30 mLs by mouth every 4 (four) hours as needed for indigestion.   amoxicillin-clavulanate 875-125 MG tablet Commonly known as: Augmentin Take 1 tablet by mouth 2 (two) times daily for 7 days.   aspirin EC 81 MG tablet Take 81 mg by mouth daily before supper. Swallow whole.   CALAZIME SKIN PROTECTANT EX Apply 1 application topically 4 (four) times daily as needed (diaper changes).   Calcium-Vitamin D 600-200 MG-UNIT tablet Take 2 tablets by mouth daily.   carvedilol 3.125 MG tablet Commonly known as: COREG Take 3.125 mg by mouth 2 (two) times daily.   cetirizine 10 MG tablet Commonly known as: ZYRTEC Take 10 mg by mouth in the morning.   enoxaparin 40 MG/0.4ML injection Commonly known as: LOVENOX Inject 0.4 mLs (40 mg total) into the skin daily for 12 days.   escitalopram 10 MG tablet Commonly known as: LEXAPRO Take 10 mg by mouth daily.   ferrous sulfate 325 (65 FE) MG tablet Take 1 tablet (325 mg total) by mouth 2 (two) times daily with a meal.   fluconazole 100 MG tablet Commonly known as: DIFLUCAN Take 1 tablet (100 mg total) by mouth daily for 1 day.   hydroxypropyl methylcellulose / hypromellose 2.5 % ophthalmic solution Commonly known as: ISOPTO TEARS / GONIOVISC Place 1 drop into both eyes 3 (three) times daily as needed for dry eyes.   Lactulose 20 GM/30ML Soln Take 30 mLs (20 g total) by mouth daily as needed.   multivitamin with  minerals Tabs tablet Take 1 tablet by mouth daily.   nystatin ointment Commonly known as: MYCOSTATIN Apply 1 application topically daily as needed for irritation.   ondansetron 4 MG tablet Commonly known as: ZOFRAN Take 1 tablet (4 mg total) by mouth 2 (two) times daily as needed for nausea or vomiting.   pantoprazole 40 MG tablet Commonly known as: PROTONIX Take 40 mg by mouth daily.   polyethylene glycol 17 g packet Commonly known as: MiraLax Take 17 g by mouth daily as needed. What changed: when to take this   potassium chloride 10 MEQ tablet Commonly known as: KLOR-CON Take 10 mEq by mouth daily at 12 noon.   PRESERVISION AREDS 2 PO Take 1 tablet by mouth 2 (two) times daily.   traMADol 50 MG tablet Commonly known as: Ultram Take 1 tablet (50 mg total) by mouth every 6 (six) hours as needed. What changed: reasons to take this   traMADol 50 MG tablet Commonly known as: ULTRAM Take 1 tablet (50 mg  total) by mouth every 4 (four) hours as needed for moderate pain. What changed: You were already taking a medication with the same name, and this prescription was added. Make sure you understand how and when to take each.   vitamin B-12 1000 MCG tablet Commonly known as: CYANOCOBALAMIN Take 1 tablet (1,000 mcg total) by mouth daily.            Durable Medical Equipment  (From admission, onward)         Start     Ordered   04/22/20 1034  For home use only DME Other see comment  Once       Comments: Hoyar lift  Question:  Length of Need  Answer:  6 Months   04/22/20 1035   04/17/20 2245  DME Walker rolling  Once       Question:  Patient needs a walker to treat with the following condition  Answer:  Total knee replacement status   04/17/20 2245   04/17/20 2245  DME Bedside commode  Once       Question:  Patient needs a bedside commode to treat with the following condition  Answer:  Total knee replacement status   04/17/20 2245           Discharge Care  Instructions  (From admission, onward)         Start     Ordered   04/23/20 0000  Discharge wound care:       Comments: Follow with orthopedics   04/23/20 0935   04/22/20 0000  Discharge wound care:       Comments: Follow with orthepedics   04/22/20 1237          Follow-up Information    [facility] On 04/24/2020.   Specialty: Orthopedic Surgery Why: at10:45am Contact information: [place] [address] [phone]        Dereck Leep, MD On 06/04/2020.   Specialty: Orthopedic Surgery Why: at 9:45am Contact information: [place] [address] [phone]        Rusty Aus, MD Follow up in 1 week(s).   Specialty: Internal Medicine Contact information: [place] [address] [phone]               Signed: Sharen Hones 04/23/2020, 9:36 AM

## 2020-04-23 NOTE — Progress Notes (Signed)
Patient alert and oriented, vss, no complaints of pain.  Escorted out of hospital via EMS.    D/c telemtry and PIV.

## 2020-04-23 NOTE — TOC Transition Note (Signed)
Transition of Care [facility]) - CM/SW Discharge Note   Patient Details  Name: [name] MRN: [id number] Date of Birth: 08-31-1945  [facility] CM/SW Contact:  Eileen Stanford, LCSW Phone Number: 04/23/2020, 11:45 AM   Clinical Narrative:    [place] arranged through [employer]. Hoyer lift ordered through [employer]. [facility] scheduled for 12:30-1:00 pickup.   Final next level of care: Home w Home Health Services Barriers to Discharge: No Barriers Identified   Patient Goals and CMS Choice        Discharge Placement                Patient [facility] be [facility]   Patient and family notified of of transfer: 04/23/20  Discharge Plan and Services                DME Arranged:  (hoyer lift) DME Agency: [employer] Date DME Agency Contacted: 04/21/20 Time DME Agency Contacted: [phone] Representative spoke with at DME Agency: [place]: RN,PT [place] Agency: [place] ([place]) Date [place]: 04/23/20 Time [place]: 0300 Representative spoke with at [place]: [place] ([place]) Interventions     Readmission Risk Interventions No flowsheet data found.

## 2020-04-23 NOTE — Telephone Encounter (Signed)
Patient called requesting more diflucan be sent to the pharmacy for her for a current yeast infection and pyridium be sent for urinary burning.  Patient is currently admitted in the hospital at [facility] and scheduled to be discharged today. Per Dr. Delaine Lame patient will need to discuss her symptoms with the providers treating her in the hospital and request that medications for her symptoms be sent to the pharmacy before her discharge. I have spoke with the patient and advised her to speak with the hospital staff in regards to her symptoms before she is discharged. Patient verbalized understanding.  Amadou Katzenstein T Brooks Sailors

## 2020-04-23 NOTE — Progress Notes (Signed)
  Subjective: 6 Days Post-Op Procedure(s) (LRB): HARDWARE REMOVAL FROM RIGHT ANKLE AND RIGHT KNEE; IMPLANT OF CEMENT SPACERS IN RIGHT KNEE (Right) Patient reports pain as well-controlled.   Patient is well, but has had some minor complaints of continued urinary burning and frequency Plan is to go Home after hospital stay. Negative for chest pain and shortness of breath Fever: no    Objective: Vital signs in last 24 hours: Temp:  [97.5 F (36.4 C)-98.3 F (36.8 C)] 98.2 F (36.8 C) (03/24 0414) Pulse Rate:  [71-84] 82 (03/24 0414) Resp:  [17-18] 18 (03/24 0414) BP: (140-148)/(63-69) 140/63 (03/24 0414) SpO2:  [98 %-100 %] 99 % (03/24 0414)  Intake/Output from previous day: No intake or output data in the 24 hours ending 04/23/20 0804  Intake/Output this shift: No intake/output data recorded.  Labs: Recent Labs    04/23/20 0413  HGB 8.5*   Recent Labs    04/23/20 0413  WBC 5.2  RBC 3.40*  HCT 26.9*  PLT 286   No results for input(s): NA, K, CL, CO2, BUN, CREATININE, GLUCOSE, CALCIUM in the last 72 hours. No results for input(s): LABPT, INR in the last 72 hours.   EXAM General - Patient is Alert, Appropriate and Oriented Extremity - Neurovascular intact Dorsiflexion/Plantar flexion intact Compartment soft Dressing/Incision -no drainage from knee incision, ankle dressing remains intact with no drainage noted through dressing Motor Function - intact, moving foot and toes well on exam.  Cardiovascular- Regular rate and rhythm, no murmurs/rubs/gallops Respiratory- Lungs clear to auscultation bilaterally Gastrointestinal- soft, nontender and active bowel sounds   Assessment/Plan: 6 Days Post-Op Procedure(s) (LRB): HARDWARE REMOVAL FROM RIGHT ANKLE AND RIGHT KNEE; IMPLANT OF CEMENT SPACERS IN RIGHT KNEE (Right) Principal Problem:   Chest pain Active Problems:   Extrinsic asthma   OSA on CPAP   Multiple lung nodules   Pressure injury of skin   COPD (chronic  obstructive pulmonary disease) (HCC)   GERD (gastroesophageal reflux disease)   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HCC)   Anxiety   History of GI diverticular bleed  Estimated body mass index is 29.8 kg/m as calculated from the following:   Height as of this encounter: 5\' 8"  (1.727 m).   Weight as of this encounter: 88.9 kg. Advance diet Up with therapy  Patient will d/c home today. Orders placed for [facility] nursing for dressing changes as well as HH PT.  DVT Prophylaxis - Lovenox, Ted hose and foot pumps Touchdown WB to right leg  Cassell Smiles, PA-C [facility] Orthopaedic Surgery 04/23/2020, 8:04 AM

## 2020-05-14 ENCOUNTER — Telehealth (INDEPENDENT_AMBULATORY_CARE_PROVIDER_SITE_OTHER): Payer: Self-pay | Admitting: Vascular Surgery

## 2020-05-14 NOTE — Telephone Encounter (Signed)
Called [facility] ([facility]) to get office to fax over letter for patient to receive transportation through insurance. Office to fax over letter for revision per GS.  This note is for documentation purposes only

## 2020-05-19 ENCOUNTER — Encounter (INDEPENDENT_AMBULATORY_CARE_PROVIDER_SITE_OTHER): Payer: Self-pay | Admitting: Vascular Surgery

## 2020-06-19 ENCOUNTER — Other Ambulatory Visit: Payer: Self-pay | Admitting: Orthopedic Surgery

## 2020-06-19 DIAGNOSIS — S91001D Unspecified open wound, right ankle, subsequent encounter: Secondary | ICD-10-CM

## 2020-07-03 ENCOUNTER — Telehealth: Payer: Self-pay | Admitting: Infectious Diseases

## 2020-07-03 NOTE — Telephone Encounter (Signed)
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 The rt  ankle still is draining. Spoke to patient. No fever. She will ask the Home nurse to get culture from rt ankle and left leg. She is scheduled for MRI on Monday Will decide on antibiotics after culture and MRI

## 2020-07-06 ENCOUNTER — Ambulatory Visit
Admission: RE | Admit: 2020-07-06 | Discharge: 2020-07-06 | Disposition: A | Discharge status: Home / Self Care | Payer: Medicare Other | Source: Ambulatory Visit | Attending: Orthopedic Surgery | Admitting: Orthopedic Surgery

## 2020-07-06 ENCOUNTER — Other Ambulatory Visit: Payer: Self-pay

## 2020-07-06 DIAGNOSIS — S91001D Unspecified open wound, right ankle, subsequent encounter: Secondary | ICD-10-CM | POA: Diagnosis present

## 2020-07-06 MED ORDER — GADOBUTROL 1 MMOL/ML IV SOLN
9.0000 mL | Freq: Once | INTRAVENOUS | Status: AC | PRN
  Administered 2020-07-06: 9 mL via INTRAVENOUS

## 2020-07-14 ENCOUNTER — Telehealth: Payer: Self-pay

## 2020-07-14 ENCOUNTER — Other Ambulatory Visit: Payer: Self-pay | Admitting: Infectious Diseases

## 2020-07-14 MED ORDER — AMOXICILLIN-POT CLAVULANATE 875-125 MG PO TABS: 1.0000 | ORAL_TABLET | Freq: Two times a day (BID) | ORAL | 1 refills | Status: DC

## 2020-07-14 NOTE — Telephone Encounter (Signed)
Error

## 2020-07-14 NOTE — Progress Notes (Signed)
Sent prescription for augmentin 1 Po BID for 14 days for the discharge from her ankle and knee- cultures were negative

## 2020-08-17 ENCOUNTER — Other Ambulatory Visit: Payer: Self-pay | Admitting: Infectious Diseases

## 2020-08-17 MED ORDER — FLUCONAZOLE 100 MG PO TABS: 100.0000 mg | ORAL_TABLET | Freq: Every day | ORAL | 0 refills | Status: AC

## 2020-10-08 ENCOUNTER — Other Ambulatory Visit: Payer: Self-pay | Admitting: Student in an Organized Health Care Education/Training Program

## 2020-10-08 DIAGNOSIS — M86371 Chronic multifocal osteomyelitis, right ankle and foot: Secondary | ICD-10-CM

## 2020-10-12 ENCOUNTER — Other Ambulatory Visit: Payer: Self-pay

## 2020-10-12 ENCOUNTER — Ambulatory Visit
Admission: RE | Admit: 2020-10-12 | Discharge: 2020-10-12 | Disposition: A | Discharge status: Home / Self Care | Payer: Medicare Other | Source: Ambulatory Visit | Attending: Student in an Organized Health Care Education/Training Program | Admitting: Student in an Organized Health Care Education/Training Program

## 2020-10-12 DIAGNOSIS — M86371 Chronic multifocal osteomyelitis, right ankle and foot: Secondary | ICD-10-CM | POA: Insufficient documentation

## 2020-10-12 MED ORDER — GADOBUTROL 1 MMOL/ML IV SOLN
8.0000 mL | Freq: Once | INTRAVENOUS | Status: AC | PRN
  Administered 2020-10-12: 8 mL via INTRAVENOUS

## 2020-12-02 DIAGNOSIS — Z89611 Acquired absence of right leg above knee: Secondary | ICD-10-CM

## 2020-12-02 HISTORY — DX: Acquired absence of right leg above knee: Z89.611

## 2020-12-02 HISTORY — PX: LEG AMPUTATION ABOVE KNEE: SHX117

## 2021-03-29 ENCOUNTER — Other Ambulatory Visit: Payer: Self-pay | Admitting: Internal Medicine

## 2021-04-05 ENCOUNTER — Other Ambulatory Visit: Payer: Self-pay | Admitting: Internal Medicine

## 2021-04-05 DIAGNOSIS — Z1231 Encounter for screening mammogram for malignant neoplasm of breast: Secondary | ICD-10-CM

## 2021-05-12 ENCOUNTER — Ambulatory Visit
Admission: RE | Admit: 2021-05-12 | Discharge: 2021-05-12 | Disposition: A | Discharge status: Home / Self Care | Payer: Medicare Other | Source: Ambulatory Visit | Attending: Internal Medicine | Admitting: Internal Medicine

## 2021-05-12 DIAGNOSIS — Z1231 Encounter for screening mammogram for malignant neoplasm of breast: Secondary | ICD-10-CM | POA: Insufficient documentation

## 2021-09-09 ENCOUNTER — Ambulatory Visit: Payer: Medicare Other | Attending: Internal Medicine | Admitting: Physical Therapy

## 2021-09-09 DIAGNOSIS — Z89611 Acquired absence of right leg above knee: Secondary | ICD-10-CM | POA: Diagnosis present

## 2021-09-09 DIAGNOSIS — M6281 Muscle weakness (generalized): Secondary | ICD-10-CM

## 2021-09-09 DIAGNOSIS — R269 Unspecified abnormalities of gait and mobility: Secondary | ICD-10-CM | POA: Diagnosis present

## 2021-09-09 DIAGNOSIS — R293 Abnormal posture: Secondary | ICD-10-CM

## 2021-09-11 ENCOUNTER — Encounter: Payer: Self-pay | Admitting: Physical Therapy

## 2021-09-11 NOTE — Therapy (Signed)
OUTPATIENT PHYSICAL THERAPY LOWER EXTREMITY EVALUATION   Patient Name: TIA HIERONYMUS MRN: [id number] DOB:13-Sep-1945, 76 y.o., female Today's Date: 09/11/2021   PT End of Session - 09/11/21 1655     Visit Number 1    Number of Visits 24    Date for PT Re-Evaluation 12/02/21    PT Start Time 1448    PT Stop Time 1820    PT Time Calculation (min) 55 min             Past Medical History:  Diagnosis Date   Anemia    Anxiety    Asthma    seasonal    GERD (gastroesophageal reflux disease)    Hypertension    PONV (postoperative nausea and vomiting)    Seasonal allergies    Sleep apnea    does not uses CPAP machine anymore    Past Surgical History:  Procedure Laterality Date   APPENDECTOMY     CESAREAN SECTION     CHOLECYSTECTOMY     DILATION AND CURETTAGE OF UTERUS     ESOPHAGOGASTRODUODENOSCOPY N/A 02/22/2019   Procedure: ESOPHAGOGASTRODUODENOSCOPY (EGD);  Surgeon: Toledo, Benay Pike, MD;  Location: [facility];  Service: Gastroenterology;  Laterality: N/A;   FLEXIBLE SIGMOIDOSCOPY N/A 02/22/2019   Procedure: FLEXIBLE SIGMOIDOSCOPY;  Surgeon: Toledo, Benay Pike, MD;  Location: [facility];  Service: Gastroenterology;  Laterality: N/A;   HARDWARE REMOVAL Right 11/12/2019   Procedure: Right ankle hardware removal;  Surgeon: Hessie Knows, MD;  Location: [facility];  Service: Orthopedics;  Laterality: Right;   HARDWARE REMOVAL Right 04/17/2020   Procedure: HARDWARE REMOVAL FROM RIGHT ANKLE AND RIGHT KNEE; IMPLANT OF CEMENT SPACERS IN RIGHT KNEE;  Surgeon: Dereck Leep, MD;  Location: [facility];  Service: Orthopedics;  Laterality: Right;   JOINT REPLACEMENT     ORIF ANKLE FRACTURE Right 03/14/2019   Procedure: OPEN REDUCTION INTERNAL FIXATION (ORIF) ANKLE FRACTURE, MEDIAL MALLEOLUS;  Surgeon: Hessie Knows, MD;  Location: [facility];  Service: Orthopedics;  Laterality: Right;   ORIF ANKLE FRACTURE Right 02/08/2019   Procedure: OPEN REDUCTION INTERNAL FIXATION (ORIF) ANKLE  FRACTURE, post malleolus;  Surgeon: Hessie Knows, MD;  Location: [facility];  Service: Orthopedics;  Laterality: Right;   REPLACEMENT TOTAL KNEE Bilateral 2008   2009   SCAR DEBRIDEMENT OF TOTAL KNEE  01/2020   SYNDESMOSIS REPAIR Right 02/08/2019   Procedure: SYNDESMOSIS REPAIR;  Surgeon: Hessie Knows, MD;  Location: [facility];  Service: Orthopedics;  Laterality: Right;   TEE WITHOUT CARDIOVERSION N/A 02/26/2019   Procedure: TRANSESOPHAGEAL ECHOCARDIOGRAM (TEE);  Surgeon: Corey Skains, MD;  Location: [facility];  Service: Cardiovascular;  Laterality: N/A;   TOTAL KNEE REVISION WITH SCAR DEBRIDEMENT/PATELLA REVISION WITH POLY EXCHANGE Right 01/24/2020   Procedure: TOTAL KNEE REVISION WITH SCAR DEBRIDEMENT/PATELLA REVISION WITH POLY EXCHANGE;  Surgeon: Dereck Leep, MD;  Location: [facility];  Service: Orthopedics;  Laterality: Right;   TUMOR REMOVAL     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   Patient Active Problem List   Diagnosis Date Noted   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[place]) 04/17/2020   Anxiety 04/17/2020   History of GI diverticular bleed 04/17/2020   Chest pain 04/17/2020   PAD (peripheral artery disease) ([place]) 04/05/2020   Ulcer of right leg ([place]) 04/05/2020   COPD (chronic obstructive pulmonary disease) ([place]) 04/05/2020   GERD (gastroesophageal reflux disease) 04/05/2020   Abscess of right knee 01/22/2020   Pressure injury of skin 02/28/2019   SOB (shortness of breath)  Gastrointestinal hemorrhage    Sepsis ([place])    Symptomatic anemia 02/20/2019   Trimalleolar fracture of ankle, closed, right, initial encounter 02/07/2019   Multiple lung nodules 03/31/2014   Extrinsic asthma 03/27/2014   OSA on CPAP 03/27/2014    PCP: Rusty Aus, MD  REFERRING PROVIDER: Rusty Aus, MD  REFERRING DIAG: Above knee amputation of right lower extremity  THERAPY DIAG:  S/P AKA (above knee amputation) unilateral, right (HCC)  Muscle weakness (generalized)  Gait  difficulty  Abnormal posture  Rationale for Evaluation and Treatment Rehabilitation  ONSET DATE: 12/02/20  SUBJECTIVE:   SUBJECTIVE STATEMENT: Pt. Arrived to PT with use of w/c.  Pt. Has had prosthesis for a couple months and working with [facility].  S/p L TKA (articulating spacer)- increase pain with walking 6/10.  No LBP.    PERTINENT HISTORY: Patient Profile:   Lateria Alderman is a 76 y.o. female Chief Complaint  Patient presents with  Visit Follow Up  Doing well.   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 Cardiac cath 10/2011 showed completely normal coronary arteries, however she did have mild left ventricular dysfunction with anterior wall motion abnormality. Left ventricular EF was 51%.  History of chickenpox  Hyperlipidemia  patient states she has never been diagnosed with this  Hypertension  Knee joint replacement by other means  Macular degeneration  Major depressive disorder, recurrent, mild (CMS-HCC)  Medicare annual wellness visit, initial 08/09/2019  7/21  Multiple thyroid nodules  Normal coronary arteries 09/06/2013  Normal coronary anatomy by cardiac catheterization 10/12/11  Osteoarthritis  Ovarian cyst  PONV (postoperative nausea and vomiting)  nausea  Postoperative urinary retention 12/08/2020  Rash on lips 12/21/2020  Seasonal allergies  Sinusitis, unspecified  Sleep  apnea  On CPAP.  Slow transit constipation 12/10/2020  Trimalleolar fracture of ankle, closed, right, initial encounter 37/90/2409  Complicated by staph infection 1/21, 4 weeks IV Ancef, [place]  Ulcer   Past Surgical History:  Procedure Laterality Date  SALPINGO OOPHORECTOMY Bilateral 1993  Right total knee arthroplasty 10/11/2006  Dr Marry Guan  Left total knee arthroplasty 10/12/2007  Dr Marry Guan  COLONOSCOPY 07/30/2012  internal hemorrhoids, diverticulosis  ANKLE ARTHODESIS W/ ARTHROSCOPY Right 02/08/2019  03/14/2019 second surgery  COLONOSCOPY 02/22/2019  Blood in entire colon/Diverticulosis - Presumed diverticular bleed. No repeat recommended per TKT.  EGD 02/22/2019  Gastritis/gastric ulcers/Hiatal hernia/Otherwise normal - no repeat recommended per TKT.  ORIF ANKLE FRACTURE Right 03/14/2019  Dr. Rudene Christians  REMOVAL HARDWARE ANKLE FOOT/TOES Right 11/12/2019  Dr. Rudene Christians  Right knee arthrotomy, irrigation and debridement of the right knee, polyethylene exchange 01/24/2020  Dr Marry Guan  Removal of hardware (plates and screws) from the right ankle with irrigation, debridement, and placement of Stimulan antibiotic beads 04/17/2020  Dr Marry Guan  Right knee arthrotomy, extensive irrigation debridement, removal of right total knee implants, and placement of antibiotic impregnated polymethylmethacrylate cement spacer 04/17/2020  Dr Marry Guan  AMPUTATION LEG ABOVE KNEE AKA Right 12/02/2020  Procedure: AMPUTATION, THIGH,  THROUGH FEMUR, ANY LEVEL; Surgeon: Lyndle Herrlich, MD; Location: [facility]; Service: Orthopedics; Laterality: Right;  TRANSFER ADJACENT TISSUE LEG Right 12/02/2020  Procedure: ADJACENT TISSUE TRANSFER OR REARRANGEMENT, LEG; DEFECT 10 SQ CM OR LESS; Surgeon: Lyndle Herrlich, MD; Location: [place]; Service: Orthopedics; Laterality: Right;  ASPIRATION/INJECTION MAJOR JOINT/BURSA KNEE Left 12/02/2020  Procedure: ARTHROCENTESIS, ASPIRATION AND/OR INJECTION, MAJOR JOINT OR  BURSA, KNEE; WITHOUT ULTRASOUND GUIDANCE; Surgeon: Lyndle Herrlich, MD; Location: [place]; Service: Orthopedics; Laterality: Left;  REMOVAL KNEE PROSTHESIS W/POSSIBLE PLACEMENT SPACER Left 01/18/2021  Procedure: REMOVAL OF PROSTHESIS, INCLUDING TOTAL KNEE PROSTHESIS, METHYLMETHACRYLATE WITH OR WITHOUT INSERTION OF SPACER, KNEE; Surgeon: Ihor Dow, MD; Location: [place]; Service: Orthopedics; Laterality: Left;  INSERTION NON-BIODEGRADABLE DRUG DELIVERY IMPLANT Left 01/18/2021  Procedure: INSERTION, KNEE, bioresorbable, biodegradable, NON-BIODEGRADABLE DRUG DELIVERY IMPLANT; Surgeon: Ihor Dow, MD; Location: [place]; Service: Orthopedics; Laterality: Left;  ABOVE KNEE LEG AMPUTATION  12/02/2020  APPENDECTOMY Est 1968  CESAREAN SECTION  CHOLECYSTECTOMY  FRACTURE SURGERY 1/21  HYSTERECTOMY partial  JOINT REPLACEMENT 2008 & 2009  TUBAL LIGATION 3/79   ALLERGIES: Allergies  Allergen Reactions  Singulair [Montelukast] Other (See Comments)  Gland swelling  Sulfa (Sulfonamide Antibiotics) Swelling  Oxycodone Dizziness and Other (See Comments)  Vancomycin Analogues Other (See Comments)  Ringing in ears  Avinza [Morphine] Itching  Darvocet A500 [Propoxyphene N-Acetaminophen] Nausea  Nickel Rash  Ultracet [Tramadol-Acetaminophen] Rash  Vicodin [Hydrocodone-Acetaminophen] Vomiting  Vioxx [Rofecoxib] Other (See Comments)  GI   CURRENT MEDICATIONS: Current Outpatient Medications: acetaminophen (TYLENOL) 500 MG tablet, Take 1,000 mg by mouth 3 (three) times a day Twice daily per patient., PRN Not Currently Taking aspirin 81 MG EC tablet, Take 1 tablet (81 mg total) by mouth at bedtime, Taking calcium carbonate-vitamin D3 (CALTRATE 600+D) 600 mg-10 mcg (400 unit) tablet, Take 0.5 tablets by mouth 2 (two) times daily, Taking cetirizine (ZYRTEC) 10 mg capsule, Take 1 capsule (10 mg total) by mouth once daily, Taking escitalopram oxalate (LEXAPRO) 10  MG tablet, Take 1 tablet (10 mg total) by mouth once daily, Taking estradioL (ESTRACE) 0.01 % (0.1 mg/gram) vaginal cream, Place 2 g vaginally twice a week, Taking folic acid/multivit,iron,miner (MULTIVIT-IRON-MIN-FOLIC ACID ORAL), Take 1 tablet by mouth once daily, Taking gabapentin (NEURONTIN) 300 MG capsule, Take 1 capsule (300 mg total) by mouth 3 (three) times daily Phantom limb pain, Taking melatonin 3 mg tablet, Take 1 tablet (3 mg total) by mouth at bedtime, Taking multivitamin with iron (COMPLETE MULTIVITAMIN-MINERAL) tablet, Take 1 tablet by mouth, Taking multivitamin with minerals, EYE, (PRESERVISION AREDS 2) soft gel capsule, Take 1 capsule by mouth 2 (two) times daily, Taking nystatin (MYCOSTATIN) 100,000 unit/gram powder, Apply small amount topically twice daily for rash under breasts, PRN Not Currently Taking pantoprazole (PROTONIX) 40 MG DR tablet, Take 1 tablet (40 mg total) by mouth 2 (two) times daily. Please call to schedule an appt. Thanks!, Taking polyethylene glycol (MIRALAX) powder, Take 17 g by mouth once daily Mix in 4-8ounces of fluid prior to taking., Taking sennosides-docusate (SENOKOT-S) 8.6-50 mg tablet, Take 1 tablet by mouth 2 (two) times daily For constipation, Taking lactose-reduced food (ENSURE PLUS ORAL), Take 237 mLs by mouth 2 (two) times daily With breakfast and dinner  HPI   CLINICAL SUMMARY:  Patient post right AKA and working with prosthesis. 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 Able to do housework, improving  PAIN:  Are you having pain? Yes: NPRS scale: 6/10 Pain location: L  knee Pain description: aching Aggravating factors: walking Relieving factors: rest  PRECAUTIONS: None  WEIGHT BEARING RESTRICTIONS No  FALLS:  Has patient fallen in last 6 months? No  LIVING ENVIRONMENT: Lives with: lives with their spouse Lives in: House/apartment Stairs: No Has following equipment at home: Gilford Rile - 4 wheeled and  Wheelchair (manual)  OCCUPATION: retired  PLOF: Requires assistive device for independence  PATIENT GOALS  improve standing tolerance/ pull up pants/ ambulate with [place] safely.     OBJECTIVE:   PATIENT SURVEYS:  FOTO initial 46/ goal 6.    COGNITION:  Overall cognitive status: Within functional limits for tasks assessed     SENSATION: WFL  POSTURE: rounded shoulders, forward head, flexed trunk , and weight shift left  PALPATION: No tenderness along R distal residual limb.  Discussed phantom limb sensation.    LOWER EXTREMITY ROM:  B LE AROM WFL except L knee extension secondary to articulating spacer.  Did not assess L/R hip extension at this time.    LOWER EXTREMITY MMT:  MMT Right eval Left eval  Hip flexion 4/5 4/5  Hip extension    Hip abduction 4+/5 4+/5  Hip adduction 4+/5 4+/5  Hip internal rotation    Hip external rotation    Knee flexion N/A 4+/5  Knee extension N/A 4+/5  Ankle dorsiflexion  5/5  Ankle plantarflexion    Ankle inversion    Ankle eversion     (Blank rows = not tested)  GAIT: Distance walked: in gym/ //-bars Assistive device utilized: Environmental consultant - 2 wheeled Level of assistance: CGA Comments: Cuing for posture correction with mirror feedback.  Flexed posture/ heavy UE assist.    TODAY'S TREATMENT: Ambulate in clinic with RW and in //-bars with use of prosthetic leg and recip. Pattern.  Working on recip. Gait/ consistent step length/ heel strike.  Mod. I with car transfers from w/c to passenger seat.  Using 3 ply sock.     PATIENT EDUCATION:  Education details: HEP/ gait and prosthetic training.  Person educated: Patient and Spouse Education method: Explanation, Demonstration, and Verbal cues Education comprehension: verbalized understanding, returned demonstration, and tactile cues required   HOME EXERCISE PROGRAM: Will issue next tx./ attempt prone position hip stretches.    ASSESSMENT:  CLINICAL IMPRESSION: Patient is a  pleasant 76 y.o. female who was seen today for physical therapy evaluation and treatment for R AKA.  Pt. Presents with B hip/LE muscle weakness and gait limitations with use of RW.  Pt. Will benefit from skilled PT services to improve standing tolerance/ gait independence and ADL.    OBJECTIVE IMPAIRMENTS Abnormal gait, decreased activity tolerance, decreased balance, decreased endurance, decreased mobility, difficulty walking, decreased ROM, decreased strength, decreased safety awareness, impaired flexibility, improper body mechanics, prosthetic dependency , and pain.   ACTIVITY LIMITATIONS carrying, lifting, standing, squatting, stairs, transfers, bed mobility, bathing, toileting, dressing, hygiene/grooming, and locomotion level  PARTICIPATION LIMITATIONS: cleaning, laundry, driving, shopping, community activity, and yard work  [place] Past/current experiences are also affecting patient's functional outcome.   REHAB POTENTIAL: Good  CLINICAL DECISION MAKING: Evolving/moderate complexity  EVALUATION COMPLEXITY: High   GOALS: Goals reviewed with patient? Yes  SHORT TERM GOALS: Target date: 10/07/21 Pt. Able to don/doff R prosthetic leg independently to improve ADL/standing tolerance.  Baseline:  min. A to don/ doff prosthetic leg Goal status: INITIAL   LONG TERM GOALS: Target date: 12/02/21  Pt. Will increase FOTO to 54 to improve functional mobility.  Baseline: initial FOTO 46 Goal status: INITIAL  2.  Pt. Able to tolerate standing 10 minutes while pulling up pants with mod. I safely while wearing prosthetic leg to improve daily activities.  Baseline:  limited standing tolerance Goal status: INITIAL  3.  Pt. Will ambulate 200 feet with use of SPC and mod. I to promote safety with household tasks/ walking into bathroom.   Baseline:  amb. With RW and min. A for safety.   Goal status: INITIAL   PLAN: PT FREQUENCY: 2x/week  PT DURATION: 12 weeks  PLANNED  INTERVENTIONS: Therapeutic exercises, Therapeutic activity, Neuromuscular re-education, Balance training, Gait training, Patient/Family education, Self Care, Joint mobilization, Prosthetic training, DME instructions, and Manual therapy  PLAN FOR NEXT SESSION: Issue HEP/ Nustep/ prone hip stretches  Pura Spice, PT, DPT # [phone] 09/11/2021, 5:03 PM

## 2021-09-14 ENCOUNTER — Ambulatory Visit: Payer: Medicare Other | Admitting: Physical Therapy

## 2021-09-14 DIAGNOSIS — Z89611 Acquired absence of right leg above knee: Secondary | ICD-10-CM

## 2021-09-14 DIAGNOSIS — R293 Abnormal posture: Secondary | ICD-10-CM

## 2021-09-14 DIAGNOSIS — M6281 Muscle weakness (generalized): Secondary | ICD-10-CM

## 2021-09-14 DIAGNOSIS — R269 Unspecified abnormalities of gait and mobility: Secondary | ICD-10-CM

## 2021-09-16 ENCOUNTER — Ambulatory Visit: Payer: Medicare Other | Admitting: Physical Therapy

## 2021-09-16 DIAGNOSIS — R269 Unspecified abnormalities of gait and mobility: Secondary | ICD-10-CM

## 2021-09-16 DIAGNOSIS — R293 Abnormal posture: Secondary | ICD-10-CM

## 2021-09-16 DIAGNOSIS — Z89611 Acquired absence of right leg above knee: Secondary | ICD-10-CM | POA: Diagnosis not present

## 2021-09-16 DIAGNOSIS — M6281 Muscle weakness (generalized): Secondary | ICD-10-CM

## 2021-09-18 NOTE — Therapy (Signed)
OUTPATIENT PHYSICAL THERAPY LOWER EXTREMITY TREATMENT   Patient Name: Jasmine Morrow MRN: [id number] DOB:Jan 29, 1946, 76 y.o., female Today's Date: 09/16/2021   PT End of Session - 09/18/21 1806     Visit Number 3    Number of Visits 24    Date for PT Re-Evaluation 12/02/21    PT Start Time 1645    PT Stop Time 1744    PT Time Calculation (min) 59 min             Past Medical History:  Diagnosis Date   Anemia    Anxiety    Asthma    seasonal    GERD (gastroesophageal reflux disease)    Hypertension    PONV (postoperative nausea and vomiting)    Seasonal allergies    Sleep apnea    does not uses CPAP machine anymore    Past Surgical History:  Procedure Laterality Date   APPENDECTOMY     CESAREAN SECTION     CHOLECYSTECTOMY     DILATION AND CURETTAGE OF UTERUS     ESOPHAGOGASTRODUODENOSCOPY N/A 02/22/2019   Procedure: ESOPHAGOGASTRODUODENOSCOPY (EGD);  Surgeon: Toledo, Benay Pike, MD;  Location: [facility];  Service: Gastroenterology;  Laterality: N/A;   FLEXIBLE SIGMOIDOSCOPY N/A 02/22/2019   Procedure: FLEXIBLE SIGMOIDOSCOPY;  Surgeon: Toledo, Benay Pike, MD;  Location: [facility];  Service: Gastroenterology;  Laterality: N/A;   HARDWARE REMOVAL Right 11/12/2019   Procedure: Right ankle hardware removal;  Surgeon: Hessie Knows, MD;  Location: [facility];  Service: Orthopedics;  Laterality: Right;   HARDWARE REMOVAL Right 04/17/2020   Procedure: HARDWARE REMOVAL FROM RIGHT ANKLE AND RIGHT KNEE; IMPLANT OF CEMENT SPACERS IN RIGHT KNEE;  Surgeon: Dereck Leep, MD;  Location: [facility];  Service: Orthopedics;  Laterality: Right;   JOINT REPLACEMENT     ORIF ANKLE FRACTURE Right 03/14/2019   Procedure: OPEN REDUCTION INTERNAL FIXATION (ORIF) ANKLE FRACTURE, MEDIAL MALLEOLUS;  Surgeon: Hessie Knows, MD;  Location: [facility];  Service: Orthopedics;  Laterality: Right;   ORIF ANKLE FRACTURE Right 02/08/2019   Procedure: OPEN REDUCTION INTERNAL FIXATION (ORIF) ANKLE  FRACTURE, post malleolus;  Surgeon: Hessie Knows, MD;  Location: [facility];  Service: Orthopedics;  Laterality: Right;   REPLACEMENT TOTAL KNEE Bilateral 2008   2009   SCAR DEBRIDEMENT OF TOTAL KNEE  01/2020   SYNDESMOSIS REPAIR Right 02/08/2019   Procedure: SYNDESMOSIS REPAIR;  Surgeon: Hessie Knows, MD;  Location: [facility];  Service: Orthopedics;  Laterality: Right;   TEE WITHOUT CARDIOVERSION N/A 02/26/2019   Procedure: TRANSESOPHAGEAL ECHOCARDIOGRAM (TEE);  Surgeon: Corey Skains, MD;  Location: [facility];  Service: Cardiovascular;  Laterality: N/A;   TOTAL KNEE REVISION WITH SCAR DEBRIDEMENT/PATELLA REVISION WITH POLY EXCHANGE Right 01/24/2020   Procedure: TOTAL KNEE REVISION WITH SCAR DEBRIDEMENT/PATELLA REVISION WITH POLY EXCHANGE;  Surgeon: Dereck Leep, MD;  Location: [facility];  Service: Orthopedics;  Laterality: Right;   TUMOR REMOVAL     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   Patient Active Problem List   Diagnosis Date Noted   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[place]) 04/17/2020   Anxiety 04/17/2020   History of GI diverticular bleed 04/17/2020   Chest pain 04/17/2020   PAD (peripheral artery disease) ([place]) 04/05/2020   Ulcer of right leg ([place]) 04/05/2020   COPD (chronic obstructive pulmonary disease) ([place]) 04/05/2020   GERD (gastroesophageal reflux disease) 04/05/2020   Abscess of right knee 01/22/2020   Pressure injury of skin 02/28/2019   SOB (shortness of breath)  Gastrointestinal hemorrhage    Sepsis ([place])    Symptomatic anemia 02/20/2019   Trimalleolar fracture of ankle, closed, right, initial encounter 02/07/2019   Multiple lung nodules 03/31/2014   Extrinsic asthma 03/27/2014   OSA on CPAP 03/27/2014    PCP: Rusty Aus, MD  REFERRING PROVIDER: Rusty Aus, MD  REFERRING DIAG: Above knee amputation of right lower extremity  THERAPY DIAG:  S/P AKA (above knee amputation) unilateral, right (HCC)  Muscle weakness (generalized)  Gait  difficulty  Abnormal posture  Rationale for Evaluation and Treatment Rehabilitation  ONSET DATE: 12/02/20  SUBJECTIVE:   SUBJECTIVE STATEMENT:  Evaluation Pt. Arrived to PT with use of w/c.  Pt. Has had prosthesis for a couple months and working with [place].  S/p L TKA (articulating spacer)- increase pain with walking 6/10.  No LBP.    PERTINENT HISTORY: Patient Profile:   Jasmine Morrow is a 76 y.o. female Chief Complaint  Patient presents with  Visit Follow Up  Doing well.   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 Cardiac cath 10/2011 showed completely normal coronary arteries, however she did have mild left ventricular dysfunction with anterior wall motion abnormality. Left ventricular EF was 51%.  History of chickenpox  Hyperlipidemia  patient states she has never been diagnosed with this  Hypertension  Knee joint replacement by other means  Macular degeneration  Major depressive disorder, recurrent, mild (CMS-HCC)  Medicare annual wellness visit, initial 08/09/2019  7/21  Multiple thyroid nodules  Normal coronary arteries 09/06/2013  Normal coronary anatomy by cardiac catheterization 10/12/11  Osteoarthritis  Ovarian cyst  PONV (postoperative nausea and vomiting)  nausea  Postoperative urinary retention 12/08/2020  Rash on lips 12/21/2020  Seasonal allergies  Sinusitis,  unspecified  Sleep apnea  On CPAP.  Slow transit constipation 12/10/2020  Trimalleolar fracture of ankle, closed, right, initial encounter 76/72/0947  Complicated by staph infection 1/21, 4 weeks IV Ancef, [place]  Ulcer   Past Surgical History:  Procedure Laterality Date  SALPINGO OOPHORECTOMY Bilateral 1993  Right total knee arthroplasty 10/11/2006  Dr Marry Guan  Left total knee arthroplasty 10/12/2007  Dr Marry Guan  COLONOSCOPY 07/30/2012  internal hemorrhoids, diverticulosis  ANKLE ARTHODESIS W/ ARTHROSCOPY Right 02/08/2019  03/14/2019 second surgery  COLONOSCOPY 02/22/2019  Blood in entire colon/Diverticulosis - Presumed diverticular bleed. No repeat recommended per TKT.  EGD 02/22/2019  Gastritis/gastric ulcers/Hiatal hernia/Otherwise normal - no repeat recommended per TKT.  ORIF ANKLE FRACTURE Right 03/14/2019  Dr. Rudene Christians  REMOVAL HARDWARE ANKLE FOOT/TOES Right 11/12/2019  Dr. Rudene Christians  Right knee arthrotomy, irrigation and debridement of the right knee, polyethylene exchange 01/24/2020  Dr Marry Guan  Removal of hardware (plates and screws) from the right ankle with irrigation, debridement, and placement of Stimulan antibiotic beads 04/17/2020  Dr Marry Guan  Right knee arthrotomy, extensive irrigation debridement, removal of right total knee implants, and placement of antibiotic impregnated polymethylmethacrylate cement spacer 04/17/2020  Dr Marry Guan  AMPUTATION LEG ABOVE KNEE AKA Right 12/02/2020  Procedure:  AMPUTATION, THIGH, THROUGH FEMUR, ANY LEVEL; Surgeon: Lyndle Herrlich, MD; Location: [facility]; Service: Orthopedics; Laterality: Right;  TRANSFER ADJACENT TISSUE LEG Right 12/02/2020  Procedure: ADJACENT TISSUE TRANSFER OR REARRANGEMENT, LEG; DEFECT 10 SQ CM OR LESS; Surgeon: Lyndle Herrlich, MD; Location: [facility]; Service: Orthopedics; Laterality: Right;  ASPIRATION/INJECTION MAJOR JOINT/BURSA KNEE Left 12/02/2020  Procedure: ARTHROCENTESIS, ASPIRATION AND/OR  INJECTION, MAJOR JOINT OR BURSA, KNEE; WITHOUT ULTRASOUND GUIDANCE; Surgeon: Lyndle Herrlich, MD; Location: [facility]; Service: Orthopedics; Laterality: Left;  REMOVAL KNEE PROSTHESIS W/POSSIBLE PLACEMENT SPACER Left 01/18/2021  Procedure: REMOVAL OF PROSTHESIS, INCLUDING TOTAL KNEE PROSTHESIS, METHYLMETHACRYLATE WITH OR WITHOUT INSERTION OF SPACER, KNEE; Surgeon: Ihor Dow, MD; Location: [facility]; Service: Orthopedics; Laterality: Left;  INSERTION NON-BIODEGRADABLE DRUG DELIVERY IMPLANT Left 01/18/2021  Procedure: INSERTION, KNEE, bioresorbable, biodegradable, NON-BIODEGRADABLE DRUG DELIVERY IMPLANT; Surgeon: Ihor Dow, MD; Location: [place]; Service: Orthopedics; Laterality: Left;  ABOVE KNEE LEG AMPUTATION  12/02/2020  APPENDECTOMY Est 1968  CESAREAN SECTION  CHOLECYSTECTOMY  FRACTURE SURGERY 1/21  HYSTERECTOMY partial  JOINT REPLACEMENT 2008 & 2009  TUBAL LIGATION 3/79   ALLERGIES: Allergies  Allergen Reactions  Singulair [Montelukast] Other (See Comments)  Gland swelling  Sulfa (Sulfonamide Antibiotics) Swelling  Oxycodone Dizziness and Other (See Comments)  Vancomycin Analogues Other (See Comments)  Ringing in ears  Avinza [Morphine] Itching  Darvocet A500 [Propoxyphene N-Acetaminophen] Nausea  Nickel Rash  Ultracet [Tramadol-Acetaminophen] Rash  Vicodin [Hydrocodone-Acetaminophen] Vomiting  Vioxx [Rofecoxib] Other (See Comments)  GI   CURRENT MEDICATIONS: Current Outpatient Medications: acetaminophen (TYLENOL) 500 MG tablet, Take 1,000 mg by mouth 3 (three) times a day Twice daily per patient., PRN Not Currently Taking aspirin 81 MG EC tablet, Take 1 tablet (81 mg total) by mouth at bedtime, Taking calcium carbonate-vitamin D3 (CALTRATE 600+D) 600 mg-10 mcg (400 unit) tablet, Take 0.5 tablets by mouth 2 (two) times daily, Taking cetirizine (ZYRTEC) 10 mg capsule, Take 1 capsule (10 mg total) by mouth once daily,  Taking escitalopram oxalate (LEXAPRO) 10 MG tablet, Take 1 tablet (10 mg total) by mouth once daily, Taking estradioL (ESTRACE) 0.01 % (0.1 mg/gram) vaginal cream, Place 2 g vaginally twice a week, Taking folic acid/multivit,iron,miner (MULTIVIT-IRON-MIN-FOLIC ACID ORAL), Take 1 tablet by mouth once daily, Taking gabapentin (NEURONTIN) 300 MG capsule, Take 1 capsule (300 mg total) by mouth 3 (three) times daily Phantom limb pain, Taking melatonin 3 mg tablet, Take 1 tablet (3 mg total) by mouth at bedtime, Taking multivitamin with iron (COMPLETE MULTIVITAMIN-MINERAL) tablet, Take 1 tablet by mouth, Taking multivitamin with minerals, EYE, (PRESERVISION AREDS 2) soft gel capsule, Take 1 capsule by mouth 2 (two) times daily, Taking nystatin (MYCOSTATIN) 100,000 unit/gram powder, Apply small amount topically twice daily for rash under breasts, PRN Not Currently Taking pantoprazole (PROTONIX) 40 MG DR tablet, Take 1 tablet (40 mg total) by mouth 2 (two) times daily. Please call to schedule an appt. Thanks!, Taking polyethylene glycol (MIRALAX) powder, Take 17 g by mouth once daily Mix in 4-8ounces of fluid prior to taking., Taking sennosides-docusate (SENOKOT-S) 8.6-50 mg tablet, Take 1 tablet by mouth 2 (two) times daily For constipation, Taking lactose-reduced food (ENSURE PLUS ORAL), Take 237 mLs by mouth 2 (two) times daily With breakfast and dinner  HPI   CLINICAL SUMMARY:  Patient post right AKA and working with prosthesis. 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 Able to do housework, improving  PAIN:  Are you having pain? Yes: NPRS scale: 5/10 Pain  location: L knee Pain description: aching Aggravating factors: walking Relieving factors: rest  PRECAUTIONS: None  WEIGHT BEARING RESTRICTIONS No  FALLS:  Has patient fallen in last 6 months? No  LIVING ENVIRONMENT: Lives with: lives with their spouse Lives in: House/apartment Stairs: No Has following  equipment at home: Gilford Rile - 4 wheeled and Wheelchair (manual)  OCCUPATION: retired  PLOF: Requires assistive device for independence  PATIENT GOALS  improve standing tolerance/ pull up pants/ ambulate with [place] safely.     OBJECTIVE:   PATIENT SURVEYS:  FOTO initial 46/ goal 24.    COGNITION:  Overall cognitive status: Within functional limits for tasks assessed     SENSATION: WFL  POSTURE: rounded shoulders, forward head, flexed trunk , and weight shift left  PALPATION: No tenderness along R distal residual limb.  Discussed phantom limb sensation.    LOWER EXTREMITY ROM:  B LE AROM WFL except L knee extension secondary to articulating spacer.  Did not assess L/R hip extension at this time.    LOWER EXTREMITY MMT:  MMT Right eval Left eval  Hip flexion 4/5 4/5  Hip extension    Hip abduction 4+/5 4+/5  Hip adduction 4+/5 4+/5  Hip internal rotation    Hip external rotation    Knee flexion N/A 4+/5  Knee extension N/A 4+/5  Ankle dorsiflexion  5/5  Ankle plantarflexion    Ankle inversion    Ankle eversion     (Blank rows = not tested)  GAIT: Distance walked: in gym/ //-bars Assistive device utilized: Environmental consultant - 2 wheeled Level of assistance: CGA Comments: Cuing for posture correction with mirror feedback.  Flexed posture/ heavy UE assist.    TODAY'S TREATMENT:  09/16/21  Subjective:     Objective:  Gait training:   Pt. Will bring various size ply socks next tx. and try 5 ply.  PT discussed bathroom set-up with pt. To determine use of RW.     Pt. Donned prosthetic leg with mod. I in car.     Pt. Stood from passenger side of car and ambulated with RW/ Min. A from front of clinic to //-bars.  Moderate cuing to correct head position/ upright posture.  Slight difficulty with carpet in foyer and PT assist to maneuver R prosthetic leg.     Standing wt. Shifting in //-bars with heavy UE assist/ mirror feedback to correct posture.  Seated rest break.   Ambulate  in //-bars with use of prosthetic leg and recip. Pattern.  Working on recip. Gait/ consistent step length/ heel strike.  Cuing to correct BOS/ posture.    Forward/backward/lateral walking in //-bars 3x each.  Seated rest break.    Step ups/ down 2x at stairs with B UE assist.  L LE up/ R LE down. Moderate forward flexed posture.   Amb. To Nustep.    Amb. From Nustep to car with use of RW and min. A for safety/ verbal cuing.  Pt. has good control of R knee mechanism.    Mod. I with car transfers from w/c to passenger seat.    There.ex.:   Seated marching 20x.  Seated hip abduction/ adduction with manual isometrics 10x 5 sec. Holds.     STS from gray chair with B UE assist/ //-bars with SBA/mod. I safely.  Extra time required/ heavy L LE use.  Increase L knee pain with return to sit.  8x.    Nustep L1 B UE/LE 2 min. (Seat 12)- fatigue.  PATIENT EDUCATION:  Education details: HEP/ gait and prosthetic training.  Person educated: Patient and Spouse Education method: Explanation, Demonstration, and Verbal cues Education comprehension: verbalized understanding, returned demonstration, and tactile cues required   HOME EXERCISE PROGRAM: Prone position hip stretches.    ASSESSMENT:  CLINICAL IMPRESSION: Pt. Ambulates into PT clinic with min. A and use of RW from car to //-bars.  Pt. Has good B shoulder AROM/ UE muscle strength.  Forward head/ rounded shoulder/ flexed posture during standing and gait.  Pt. Able to correct upright posture with verbal cuing/ mirror feedback. Pt. Requires heavy UE assist/ use of L LE to stand from chair and correct posture.  No LOB during tx. Session.  Moderate fatigue towards end of tx. Session, after step ups/ Nustep attempt.   Pt. Will benefit from skilled PT services to improve standing tolerance/ gait independence and ADL.    OBJECTIVE IMPAIRMENTS Abnormal gait, decreased activity tolerance, decreased balance, decreased endurance, decreased mobility,  difficulty walking, decreased ROM, decreased strength, decreased safety awareness, impaired flexibility, improper body mechanics, prosthetic dependency , and pain.   ACTIVITY LIMITATIONS carrying, lifting, standing, squatting, stairs, transfers, bed mobility, bathing, toileting, dressing, hygiene/grooming, and locomotion level  PARTICIPATION LIMITATIONS: cleaning, laundry, driving, shopping, community activity, and yard work  [place] Past/current experiences are also affecting patient's functional outcome.   REHAB POTENTIAL: Good  CLINICAL DECISION MAKING: Evolving/moderate complexity  EVALUATION COMPLEXITY: High   GOALS: Goals reviewed with patient? Yes  SHORT TERM GOALS: Target date: 10/07/21 Pt. Able to don/doff R prosthetic leg independently to improve ADL/standing tolerance.  Baseline:  min. A to don/ doff prosthetic leg Goal status: INITIAL   LONG TERM GOALS: Target date: 12/02/21  Pt. Will increase FOTO to 54 to improve functional mobility.  Baseline: initial FOTO 46 Goal status: INITIAL  2.  Pt. Able to tolerate standing 10 minutes while pulling up pants with mod. I safely while wearing prosthetic leg to improve daily activities.  Baseline:  limited standing tolerance Goal status: INITIAL  3.  Pt. Will ambulate 200 feet with use of SPC and mod. I to promote safety with household tasks/ walking into bathroom.   Baseline:  amb. With RW and min. A for safety.   Goal status: INITIAL   PLAN: PT FREQUENCY: 2x/week  PT DURATION: 12 weeks  PLANNED INTERVENTIONS: Therapeutic exercises, Therapeutic activity, Neuromuscular re-education, Balance training, Gait training, Patient/Family education, Self Care, Joint mobilization, Prosthetic training, DME instructions, and Manual therapy  PLAN FOR NEXT SESSION: Nustep/ step ups  Pura Spice, PT, DPT # [phone] 09/18/2021, 6:07 PM

## 2021-09-18 NOTE — Therapy (Signed)
OUTPATIENT PHYSICAL THERAPY LOWER EXTREMITY TREATMENT   Patient Name: Jasmine Morrow MRN: [id number] DOB:Nov 12, 1945, 76 y.o., female Today's Date: 09/14/2021   PT End of Session - 09/18/21 1731     Visit Number 2    Number of Visits 24    Date for PT Re-Evaluation 12/02/21    PT Start Time 1726    PT Stop Time 1827    PT Time Calculation (min) 61 min    Equipment Utilized During Treatment Gait belt             Past Medical History:  Diagnosis Date   Anemia    Anxiety    Asthma    seasonal    GERD (gastroesophageal reflux disease)    Hypertension    PONV (postoperative nausea and vomiting)    Seasonal allergies    Sleep apnea    does not uses CPAP machine anymore    Past Surgical History:  Procedure Laterality Date   APPENDECTOMY     CESAREAN SECTION     CHOLECYSTECTOMY     DILATION AND CURETTAGE OF UTERUS     ESOPHAGOGASTRODUODENOSCOPY N/A 02/22/2019   Procedure: ESOPHAGOGASTRODUODENOSCOPY (EGD);  Surgeon: Toledo, Benay Pike, MD;  Location: [facility];  Service: Gastroenterology;  Laterality: N/A;   FLEXIBLE SIGMOIDOSCOPY N/A 02/22/2019   Procedure: FLEXIBLE SIGMOIDOSCOPY;  Surgeon: Toledo, Benay Pike, MD;  Location: [facility];  Service: Gastroenterology;  Laterality: N/A;   HARDWARE REMOVAL Right 11/12/2019   Procedure: Right ankle hardware removal;  Surgeon: Hessie Knows, MD;  Location: [facility];  Service: Orthopedics;  Laterality: Right;   HARDWARE REMOVAL Right 04/17/2020   Procedure: HARDWARE REMOVAL FROM RIGHT ANKLE AND RIGHT KNEE; IMPLANT OF CEMENT SPACERS IN RIGHT KNEE;  Surgeon: Dereck Leep, MD;  Location: [facility];  Service: Orthopedics;  Laterality: Right;   JOINT REPLACEMENT     ORIF ANKLE FRACTURE Right 03/14/2019   Procedure: OPEN REDUCTION INTERNAL FIXATION (ORIF) ANKLE FRACTURE, MEDIAL MALLEOLUS;  Surgeon: Hessie Knows, MD;  Location: [facility];  Service: Orthopedics;  Laterality: Right;   ORIF ANKLE FRACTURE Right 02/08/2019   Procedure:  OPEN REDUCTION INTERNAL FIXATION (ORIF) ANKLE FRACTURE, post malleolus;  Surgeon: Hessie Knows, MD;  Location: [facility];  Service: Orthopedics;  Laterality: Right;   REPLACEMENT TOTAL KNEE Bilateral 2008   2009   SCAR DEBRIDEMENT OF TOTAL KNEE  01/2020   SYNDESMOSIS REPAIR Right 02/08/2019   Procedure: SYNDESMOSIS REPAIR;  Surgeon: Hessie Knows, MD;  Location: [facility];  Service: Orthopedics;  Laterality: Right;   TEE WITHOUT CARDIOVERSION N/A 02/26/2019   Procedure: TRANSESOPHAGEAL ECHOCARDIOGRAM (TEE);  Surgeon: Corey Skains, MD;  Location: [facility];  Service: Cardiovascular;  Laterality: N/A;   TOTAL KNEE REVISION WITH SCAR DEBRIDEMENT/PATELLA REVISION WITH POLY EXCHANGE Right 01/24/2020   Procedure: TOTAL KNEE REVISION WITH SCAR DEBRIDEMENT/PATELLA REVISION WITH POLY EXCHANGE;  Surgeon: Dereck Leep, MD;  Location: [facility];  Service: Orthopedics;  Laterality: Right;   TUMOR REMOVAL     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   Patient Active Problem List   Diagnosis Date Noted   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[place]) 04/17/2020   Anxiety 04/17/2020   History of GI diverticular bleed 04/17/2020   Chest pain 04/17/2020   PAD (peripheral artery disease) ([place]) 04/05/2020   Ulcer of right leg ([place]) 04/05/2020   COPD (chronic obstructive pulmonary disease) ([place]) 04/05/2020   GERD (gastroesophageal reflux disease) 04/05/2020   Abscess of right knee 01/22/2020   Pressure injury of skin 02/28/2019  SOB (shortness of breath)    Gastrointestinal hemorrhage    Sepsis ([place])    Symptomatic anemia 02/20/2019   Trimalleolar fracture of ankle, closed, right, initial encounter 02/07/2019   Multiple lung nodules 03/31/2014   Extrinsic asthma 03/27/2014   OSA on CPAP 03/27/2014    PCP: Rusty Aus, MD  REFERRING PROVIDER: Rusty Aus, MD  REFERRING DIAG: Above knee amputation of right lower extremity  THERAPY DIAG:  S/P AKA (above knee amputation) unilateral, right  (HCC)  Muscle weakness (generalized)  Gait difficulty  Abnormal posture  Rationale for Evaluation and Treatment Rehabilitation  ONSET DATE: 12/02/20  SUBJECTIVE:   SUBJECTIVE STATEMENT: Pt. Arrived to PT with use of w/c.  Pt. Has had prosthesis for a couple months and working with [place].  S/p L TKA (articulating spacer)- increase pain with walking 6/10.  No LBP.    PERTINENT HISTORY: Patient Profile:   Jasmine Morrow is a 76 y.o. female Chief Complaint  Patient presents with  Visit Follow Up  Doing well.   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 Cardiac cath 10/2011 showed completely normal coronary arteries, however she did have mild left ventricular dysfunction with anterior wall motion abnormality. Left ventricular EF was 51%.  History of chickenpox  Hyperlipidemia  patient states she has never been diagnosed with this  Hypertension  Knee joint replacement by other means  Macular degeneration  Major depressive disorder, recurrent, mild (CMS-HCC)  Medicare annual wellness visit, initial 08/09/2019  7/21  Multiple thyroid nodules  Normal coronary arteries 09/06/2013  Normal coronary anatomy by cardiac catheterization 10/12/11  Osteoarthritis  Ovarian cyst  PONV (postoperative nausea and vomiting)  nausea  Postoperative urinary retention 12/08/2020  Rash on lips 12/21/2020   Seasonal allergies  Sinusitis, unspecified  Sleep apnea  On CPAP.  Slow transit constipation 12/10/2020  Trimalleolar fracture of ankle, closed, right, initial encounter 91/47/8295  Complicated by staph infection 1/21, 4 weeks IV Ancef, [place]  Ulcer   Past Surgical History:  Procedure Laterality Date  SALPINGO OOPHORECTOMY Bilateral 1993  Right total knee arthroplasty 10/11/2006  Dr Marry Guan  Left total knee arthroplasty 10/12/2007  Dr Marry Guan  COLONOSCOPY 07/30/2012  internal hemorrhoids, diverticulosis  ANKLE ARTHODESIS W/ ARTHROSCOPY Right 02/08/2019  03/14/2019 second surgery  COLONOSCOPY 02/22/2019  Blood in entire colon/Diverticulosis - Presumed diverticular bleed. No repeat recommended per TKT.  EGD 02/22/2019  Gastritis/gastric ulcers/Hiatal hernia/Otherwise normal - no repeat recommended per TKT.  ORIF ANKLE FRACTURE Right 03/14/2019  Dr. Rudene Christians  REMOVAL HARDWARE ANKLE FOOT/TOES Right 11/12/2019  Dr. Rudene Christians  Right knee arthrotomy, irrigation and debridement of the right knee, polyethylene exchange 01/24/2020  Dr Marry Guan  Removal of hardware (plates and screws) from the right ankle with irrigation, debridement, and placement of Stimulan antibiotic beads 04/17/2020  Dr Marry Guan  Right knee arthrotomy, extensive irrigation debridement, removal of right total knee implants, and placement of antibiotic impregnated polymethylmethacrylate cement spacer 04/17/2020  Dr Marry Guan  AMPUTATION LEG ABOVE KNEE  AKA Right 12/02/2020  Procedure: AMPUTATION, THIGH, THROUGH FEMUR, ANY LEVEL; Surgeon: Lyndle Herrlich, MD; Location: [place]; Service: Orthopedics; Laterality: Right;  TRANSFER ADJACENT TISSUE LEG Right 12/02/2020  Procedure: ADJACENT TISSUE TRANSFER OR REARRANGEMENT, LEG; DEFECT 10 SQ CM OR LESS; Surgeon: Lyndle Herrlich, MD; Location: [place]; Service: Orthopedics; Laterality: Right;  ASPIRATION/INJECTION MAJOR JOINT/BURSA KNEE Left 12/02/2020  Procedure:  ARTHROCENTESIS, ASPIRATION AND/OR INJECTION, MAJOR JOINT OR BURSA, KNEE; WITHOUT ULTRASOUND GUIDANCE; Surgeon: Lyndle Herrlich, MD; Location: [place]; Service: Orthopedics; Laterality: Left;  REMOVAL KNEE PROSTHESIS W/POSSIBLE PLACEMENT SPACER Left 01/18/2021  Procedure: REMOVAL OF PROSTHESIS, INCLUDING TOTAL KNEE PROSTHESIS, METHYLMETHACRYLATE WITH OR WITHOUT INSERTION OF SPACER, KNEE; Surgeon: Ihor Dow, MD; Location: [place]; Service: Orthopedics; Laterality: Left;  INSERTION NON-BIODEGRADABLE DRUG DELIVERY IMPLANT Left 01/18/2021  Procedure: INSERTION, KNEE, bioresorbable, biodegradable, NON-BIODEGRADABLE DRUG DELIVERY IMPLANT; Surgeon: Ihor Dow, MD; Location: [place]; Service: Orthopedics; Laterality: Left;  ABOVE KNEE LEG AMPUTATION  12/02/2020  APPENDECTOMY Est 1968  CESAREAN SECTION  CHOLECYSTECTOMY  FRACTURE SURGERY 1/21  HYSTERECTOMY partial  JOINT REPLACEMENT 2008 & 2009  TUBAL LIGATION 3/79   ALLERGIES: Allergies  Allergen Reactions  Singulair [Montelukast] Other (See Comments)  Gland swelling  Sulfa (Sulfonamide Antibiotics) Swelling  Oxycodone Dizziness and Other (See Comments)  Vancomycin Analogues Other (See Comments)  Ringing in ears  Avinza [Morphine] Itching  Darvocet A500 [Propoxyphene N-Acetaminophen] Nausea  Nickel Rash  Ultracet [Tramadol-Acetaminophen] Rash  Vicodin [Hydrocodone-Acetaminophen] Vomiting  Vioxx [Rofecoxib] Other (See Comments)  GI   CURRENT MEDICATIONS: Current Outpatient Medications: acetaminophen (TYLENOL) 500 MG tablet, Take 1,000 mg by mouth 3 (three) times a day Twice daily per patient., PRN Not Currently Taking aspirin 81 MG EC tablet, Take 1 tablet (81 mg total) by mouth at bedtime, Taking calcium carbonate-vitamin D3 (CALTRATE 600+D) 600 mg-10 mcg (400 unit) tablet, Take 0.5 tablets by mouth 2 (two) times daily, Taking cetirizine (ZYRTEC) 10 mg capsule, Take 1 capsule (10 mg total) by  mouth once daily, Taking escitalopram oxalate (LEXAPRO) 10 MG tablet, Take 1 tablet (10 mg total) by mouth once daily, Taking estradioL (ESTRACE) 0.01 % (0.1 mg/gram) vaginal cream, Place 2 g vaginally twice a week, Taking folic acid/multivit,iron,miner (MULTIVIT-IRON-MIN-FOLIC ACID ORAL), Take 1 tablet by mouth once daily, Taking gabapentin (NEURONTIN) 300 MG capsule, Take 1 capsule (300 mg total) by mouth 3 (three) times daily Phantom limb pain, Taking melatonin 3 mg tablet, Take 1 tablet (3 mg total) by mouth at bedtime, Taking multivitamin with iron (COMPLETE MULTIVITAMIN-MINERAL) tablet, Take 1 tablet by mouth, Taking multivitamin with minerals, EYE, (PRESERVISION AREDS 2) soft gel capsule, Take 1 capsule by mouth 2 (two) times daily, Taking nystatin (MYCOSTATIN) 100,000 unit/gram powder, Apply small amount topically twice daily for rash under breasts, PRN Not Currently Taking pantoprazole (PROTONIX) 40 MG DR tablet, Take 1 tablet (40 mg total) by mouth 2 (two) times daily. Please call to schedule an appt. Thanks!, Taking polyethylene glycol (MIRALAX) powder, Take 17 g by mouth once daily Mix in 4-8ounces of fluid prior to taking., Taking sennosides-docusate (SENOKOT-S) 8.6-50 mg tablet, Take 1 tablet by mouth 2 (two) times daily For constipation, Taking lactose-reduced food (ENSURE PLUS ORAL), Take 237 mLs by mouth 2 (two) times daily With breakfast and dinner  HPI   CLINICAL SUMMARY:  Patient post right AKA and working with prosthesis. 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 Able to do housework, improving  PAIN:  Are you having pain?  Yes: NPRS scale: 6/10 Pain location: L knee Pain description: aching Aggravating factors: walking Relieving factors: rest  PRECAUTIONS: None  WEIGHT BEARING RESTRICTIONS No  FALLS:  Has patient fallen in last 6 months? No  LIVING ENVIRONMENT: Lives with: lives with their spouse Lives in: House/apartment Stairs:  No Has following equipment at home: Gilford Rile - 4 wheeled and Wheelchair (manual)  OCCUPATION: retired  PLOF: Requires assistive device for independence  PATIENT GOALS  improve standing tolerance/ pull up pants/ ambulate with [place] safely.     OBJECTIVE:   PATIENT SURVEYS:  FOTO initial 46/ goal 25.    COGNITION:  Overall cognitive status: Within functional limits for tasks assessed     SENSATION: WFL  POSTURE: rounded shoulders, forward head, flexed trunk , and weight shift left  PALPATION: No tenderness along R distal residual limb.  Discussed phantom limb sensation.    LOWER EXTREMITY ROM:  B LE AROM WFL except L knee extension secondary to articulating spacer.  Did not assess L/R hip extension at this time.    LOWER EXTREMITY MMT:  MMT Right eval Left eval  Hip flexion 4/5 4/5  Hip extension    Hip abduction 4+/5 4+/5  Hip adduction 4+/5 4+/5  Hip internal rotation    Hip external rotation    Knee flexion N/A 4+/5  Knee extension N/A 4+/5  Ankle dorsiflexion  5/5  Ankle plantarflexion    Ankle inversion    Ankle eversion     (Blank rows = not tested)  GAIT: Distance walked: in gym/ //-bars Assistive device utilized: Environmental consultant - 2 wheeled Level of assistance: CGA Comments: Cuing for posture correction with mirror feedback.  Flexed posture/ heavy UE assist.    TODAY'S TREATMENT:  09/14/21  Subjective:  Pt. Reports no pain and reports minimal prosthetic wear time at home.  Pt. Not safe to ambulate with mod. I or husband assist at home at this time.  Pt. Very motivated to walk in //-bars/ with use of RW.  Pt. Entered PT with use of w/c and carrying prosthetic leg.  Pt. Named prosthetic leg "[place]"    Objective:  There.ex.:   Pt. Independently donned prosthetic leg with 3 ply sock.   Seated marching 20x.  Seated hip abduction/ adduction with manual isometrics 10x 5 sec. Holds.     STS from gray chair with B UE assist/ //-bars with SBA/mod. I safely.  Extra  time required/ heavy L LE use.  Increase L knee pain with return to sit.  8x.    Reassessment of B shoulder AROM (all planes).     Manual tx:   Prone position stretches to increase B hip flexibility.  Manual B hip flexor/ rectus stretches in prone 3x each.  Pt. Instructed to lie prone at home 1-2x/day to increase hip mobility.  Gait training:   Standing wt. Shifting in //-bars with heavy UE assist/ mirror feedback to correct posture. Ambulate in //-bars with use of prosthetic leg and recip. Pattern.  Working on recip. Gait/ consistent step length/ heel strike.  Cuing to correct BOS/ posture.    Forward/backward/lateral walking in //-bars 3x each.  Seated rest break.    Amb. In clinic/ out of car with use of RW and min. A for safety/ verbal cuing.  Pt. As good control of R knee mechanism.  Turning to L.  Mod. I with car transfers from w/c to passenger seat.     PATIENT EDUCATION:  Education details: HEP/ gait and prosthetic training.  Person  educated: Patient and Spouse Education method: Explanation, Demonstration, and Verbal cues Education comprehension: verbalized understanding, returned demonstration, and tactile cues required   HOME EXERCISE PROGRAM: Prone position hip stretches.    ASSESSMENT:  CLINICAL IMPRESSION: Pt. Is highly motivates and works really hard during tx. Session.  Pt. Able to don/doff prosthetic leg with mod. I and transfer into car with mod. I from w/c.  Pt. Requires heavy UE assist/ use of L LE to stand from chair and correct posture.  Moderate verbal cuing during gait training to correct upright posture/ BOS with gait/R foot placement.  No LOB during tx. Session.   Pt. Will benefit from skilled PT services to improve standing tolerance/ gait independence and ADL.    OBJECTIVE IMPAIRMENTS Abnormal gait, decreased activity tolerance, decreased balance, decreased endurance, decreased mobility, difficulty walking, decreased ROM, decreased strength, decreased  safety awareness, impaired flexibility, improper body mechanics, prosthetic dependency , and pain.   ACTIVITY LIMITATIONS carrying, lifting, standing, squatting, stairs, transfers, bed mobility, bathing, toileting, dressing, hygiene/grooming, and locomotion level  PARTICIPATION LIMITATIONS: cleaning, laundry, driving, shopping, community activity, and yard work  [place] Past/current experiences are also affecting patient's functional outcome.   REHAB POTENTIAL: Good  CLINICAL DECISION MAKING: Evolving/moderate complexity  EVALUATION COMPLEXITY: High   GOALS: Goals reviewed with patient? Yes  SHORT TERM GOALS: Target date: 10/07/21 Pt. Able to don/doff R prosthetic leg independently to improve ADL/standing tolerance.  Baseline:  min. A to don/ doff prosthetic leg Goal status: INITIAL   LONG TERM GOALS: Target date: 12/02/21  Pt. Will increase FOTO to 54 to improve functional mobility.  Baseline: initial FOTO 46 Goal status: INITIAL  2.  Pt. Able to tolerate standing 10 minutes while pulling up pants with mod. I safely while wearing prosthetic leg to improve daily activities.  Baseline:  limited standing tolerance Goal status: INITIAL  3.  Pt. Will ambulate 200 feet with use of SPC and mod. I to promote safety with household tasks/ walking into bathroom.   Baseline:  amb. With RW and min. A for safety.   Goal status: INITIAL   PLAN: PT FREQUENCY: 2x/week  PT DURATION: 12 weeks  PLANNED INTERVENTIONS: Therapeutic exercises, Therapeutic activity, Neuromuscular re-education, Balance training, Gait training, Patient/Family education, Self Care, Joint mobilization, Prosthetic training, DME instructions, and Manual therapy  PLAN FOR NEXT SESSION: Issue HEP/ Nustep/ step ups  Pura Spice, PT, DPT # [id number] 09/18/2021, 5:36 PM

## 2021-09-21 ENCOUNTER — Encounter: Payer: Medicare Other | Admitting: Physical Therapy

## 2021-09-23 ENCOUNTER — Ambulatory Visit: Payer: Medicare Other | Admitting: Physical Therapy

## 2021-09-23 DIAGNOSIS — R269 Unspecified abnormalities of gait and mobility: Secondary | ICD-10-CM

## 2021-09-23 DIAGNOSIS — Z89611 Acquired absence of right leg above knee: Secondary | ICD-10-CM | POA: Diagnosis not present

## 2021-09-23 DIAGNOSIS — R293 Abnormal posture: Secondary | ICD-10-CM

## 2021-09-23 DIAGNOSIS — M6281 Muscle weakness (generalized): Secondary | ICD-10-CM

## 2021-09-25 NOTE — Therapy (Signed)
OUTPATIENT PHYSICAL THERAPY LOWER EXTREMITY TREATMENT   Patient Name: Jasmine Morrow MRN: [id number] DOB:1945-02-21, 76 y.o., female Today's Date: 09/23/2021   PT End of Session - 09/25/21 0850     Visit Number 4    Number of Visits 24    Date for PT Re-Evaluation 12/02/21    PT Start Time 1432    PT Stop Time 1536    PT Time Calculation (min) 64 min             Past Medical History:  Diagnosis Date   Anemia    Anxiety    Asthma    seasonal    GERD (gastroesophageal reflux disease)    Hypertension    PONV (postoperative nausea and vomiting)    Seasonal allergies    Sleep apnea    does not uses CPAP machine anymore    Past Surgical History:  Procedure Laterality Date   APPENDECTOMY     CESAREAN SECTION     CHOLECYSTECTOMY     DILATION AND CURETTAGE OF UTERUS     ESOPHAGOGASTRODUODENOSCOPY N/A 02/22/2019   Procedure: ESOPHAGOGASTRODUODENOSCOPY (EGD);  Surgeon: Toledo, Benay Pike, MD;  Location: [facility];  Service: Gastroenterology;  Laterality: N/A;   FLEXIBLE SIGMOIDOSCOPY N/A 02/22/2019   Procedure: FLEXIBLE SIGMOIDOSCOPY;  Surgeon: Toledo, Benay Pike, MD;  Location: [facility];  Service: Gastroenterology;  Laterality: N/A;   HARDWARE REMOVAL Right 11/12/2019   Procedure: Right ankle hardware removal;  Surgeon: Hessie Knows, MD;  Location: [facility];  Service: Orthopedics;  Laterality: Right;   HARDWARE REMOVAL Right 04/17/2020   Procedure: HARDWARE REMOVAL FROM RIGHT ANKLE AND RIGHT KNEE; IMPLANT OF CEMENT SPACERS IN RIGHT KNEE;  Surgeon: Dereck Leep, MD;  Location: [facility];  Service: Orthopedics;  Laterality: Right;   JOINT REPLACEMENT     ORIF ANKLE FRACTURE Right 03/14/2019   Procedure: OPEN REDUCTION INTERNAL FIXATION (ORIF) ANKLE FRACTURE, MEDIAL MALLEOLUS;  Surgeon: Hessie Knows, MD;  Location: [facility];  Service: Orthopedics;  Laterality: Right;   ORIF ANKLE FRACTURE Right 02/08/2019   Procedure: OPEN REDUCTION INTERNAL FIXATION (ORIF) ANKLE  FRACTURE, post malleolus;  Surgeon: Hessie Knows, MD;  Location: [facility];  Service: Orthopedics;  Laterality: Right;   REPLACEMENT TOTAL KNEE Bilateral 2008   2009   SCAR DEBRIDEMENT OF TOTAL KNEE  01/2020   SYNDESMOSIS REPAIR Right 02/08/2019   Procedure: SYNDESMOSIS REPAIR;  Surgeon: Hessie Knows, MD;  Location: [facility];  Service: Orthopedics;  Laterality: Right;   TEE WITHOUT CARDIOVERSION N/A 02/26/2019   Procedure: TRANSESOPHAGEAL ECHOCARDIOGRAM (TEE);  Surgeon: Corey Skains, MD;  Location: [facility];  Service: Cardiovascular;  Laterality: N/A;   TOTAL KNEE REVISION WITH SCAR DEBRIDEMENT/PATELLA REVISION WITH POLY EXCHANGE Right 01/24/2020   Procedure: TOTAL KNEE REVISION WITH SCAR DEBRIDEMENT/PATELLA REVISION WITH POLY EXCHANGE;  Surgeon: Dereck Leep, MD;  Location: [facility];  Service: Orthopedics;  Laterality: Right;   TUMOR REMOVAL     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   Patient Active Problem List   Diagnosis Date Noted   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[place]) 04/17/2020   Anxiety 04/17/2020   History of GI diverticular bleed 04/17/2020   Chest pain 04/17/2020   PAD (peripheral artery disease) ([place]) 04/05/2020   Ulcer of right leg ([place]) 04/05/2020   COPD (chronic obstructive pulmonary disease) ([place]) 04/05/2020   GERD (gastroesophageal reflux disease) 04/05/2020   Abscess of right knee 01/22/2020   Pressure injury of skin 02/28/2019   SOB (shortness of breath)  Gastrointestinal hemorrhage    Sepsis ([place])    Symptomatic anemia 02/20/2019   Trimalleolar fracture of ankle, closed, right, initial encounter 02/07/2019   Multiple lung nodules 03/31/2014   Extrinsic asthma 03/27/2014   OSA on CPAP 03/27/2014    PCP: Rusty Aus, MD  REFERRING PROVIDER: Rusty Aus, MD  REFERRING DIAG: Above knee amputation of right lower extremity  THERAPY DIAG:  S/P AKA (above knee amputation) unilateral, right (HCC)  Muscle weakness (generalized)  Gait  difficulty  Abnormal posture  Rationale for Evaluation and Treatment Rehabilitation  ONSET DATE: 12/02/20  SUBJECTIVE:   SUBJECTIVE STATEMENT:  Evaluation Pt. Arrived to PT with use of w/c.  Pt. Has had prosthesis for a couple months and working with [place].  S/p L TKA (articulating spacer)- increase pain with walking 6/10.  No LBP.    PERTINENT HISTORY: Patient Profile:   Jasmine Morrow is a 76 y.o. female Chief Complaint  Patient presents with  Visit Follow Up  Doing well.   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 Cardiac cath 10/2011 showed completely normal coronary arteries, however she did have mild left ventricular dysfunction with anterior wall motion abnormality. Left ventricular EF was 51%.  History of chickenpox  Hyperlipidemia  patient states she has never been diagnosed with this  Hypertension  Knee joint replacement by other means  Macular degeneration  Major depressive disorder, recurrent, mild (CMS-HCC)  Medicare annual wellness visit, initial 08/09/2019  7/21  Multiple thyroid nodules  Normal coronary arteries 09/06/2013  Normal coronary anatomy by cardiac catheterization 10/12/11  Osteoarthritis  Ovarian cyst  PONV (postoperative nausea and vomiting)  nausea  Postoperative urinary retention 12/08/2020  Rash on lips 12/21/2020  Seasonal allergies  Sinusitis,  unspecified  Sleep apnea  On CPAP.  Slow transit constipation 12/10/2020  Trimalleolar fracture of ankle, closed, right, initial encounter 62/70/3500  Complicated by staph infection 1/21, 4 weeks IV Ancef, [place]  Ulcer   Past Surgical History:  Procedure Laterality Date  SALPINGO OOPHORECTOMY Bilateral 1993  Right total knee arthroplasty 10/11/2006  Dr Marry Guan  Left total knee arthroplasty 10/12/2007  Dr Marry Guan  COLONOSCOPY 07/30/2012  internal hemorrhoids, diverticulosis  ANKLE ARTHODESIS W/ ARTHROSCOPY Right 02/08/2019  03/14/2019 second surgery  COLONOSCOPY 02/22/2019  Blood in entire colon/Diverticulosis - Presumed diverticular bleed. No repeat recommended per TKT.  EGD 02/22/2019  Gastritis/gastric ulcers/Hiatal hernia/Otherwise normal - no repeat recommended per TKT.  ORIF ANKLE FRACTURE Right 03/14/2019  Dr. Rudene Christians  REMOVAL HARDWARE ANKLE FOOT/TOES Right 11/12/2019  Dr. Rudene Christians  Right knee arthrotomy, irrigation and debridement of the right knee, polyethylene exchange 01/24/2020  Dr Marry Guan  Removal of hardware (plates and screws) from the right ankle with irrigation, debridement, and placement of Stimulan antibiotic beads 04/17/2020  Dr Marry Guan  Right knee arthrotomy, extensive irrigation debridement, removal of right total knee implants, and placement of antibiotic impregnated polymethylmethacrylate cement spacer 04/17/2020  Dr Marry Guan  AMPUTATION LEG ABOVE KNEE AKA Right 12/02/2020  Procedure:  AMPUTATION, THIGH, THROUGH FEMUR, ANY LEVEL; Surgeon: Lyndle Herrlich, MD; Location: [place]; Service: Orthopedics; Laterality: Right;  TRANSFER ADJACENT TISSUE LEG Right 12/02/2020  Procedure: ADJACENT TISSUE TRANSFER OR REARRANGEMENT, LEG; DEFECT 10 SQ CM OR LESS; Surgeon: Lyndle Herrlich, MD; Location: [place]; Service: Orthopedics; Laterality: Right;  ASPIRATION/INJECTION MAJOR JOINT/BURSA KNEE Left 12/02/2020  Procedure: ARTHROCENTESIS, ASPIRATION AND/OR  INJECTION, MAJOR JOINT OR BURSA, KNEE; WITHOUT ULTRASOUND GUIDANCE; Surgeon: Lyndle Herrlich, MD; Location: [place]; Service: Orthopedics; Laterality: Left;  REMOVAL KNEE PROSTHESIS W/POSSIBLE PLACEMENT SPACER Left 01/18/2021  Procedure: REMOVAL OF PROSTHESIS, INCLUDING TOTAL KNEE PROSTHESIS, METHYLMETHACRYLATE WITH OR WITHOUT INSERTION OF SPACER, KNEE; Surgeon: Ihor Dow, MD; Location: [place]; Service: Orthopedics; Laterality: Left;  INSERTION NON-BIODEGRADABLE DRUG DELIVERY IMPLANT Left 01/18/2021  Procedure: INSERTION, KNEE, bioresorbable, biodegradable, NON-BIODEGRADABLE DRUG DELIVERY IMPLANT; Surgeon: Ihor Dow, MD; Location: [place]; Service: Orthopedics; Laterality: Left;  ABOVE KNEE LEG AMPUTATION  12/02/2020  APPENDECTOMY Est 1968  CESAREAN SECTION  CHOLECYSTECTOMY  FRACTURE SURGERY 1/21  HYSTERECTOMY partial  JOINT REPLACEMENT 2008 & 2009  TUBAL LIGATION 3/79   ALLERGIES: Allergies  Allergen Reactions  Singulair [Montelukast] Other (See Comments)  Gland swelling  Sulfa (Sulfonamide Antibiotics) Swelling  Oxycodone Dizziness and Other (See Comments)  Vancomycin Analogues Other (See Comments)  Ringing in ears  Avinza [Morphine] Itching  Darvocet A500 [Propoxyphene N-Acetaminophen] Nausea  Nickel Rash  Ultracet [Tramadol-Acetaminophen] Rash  Vicodin [Hydrocodone-Acetaminophen] Vomiting  Vioxx [Rofecoxib] Other (See Comments)  GI   CURRENT MEDICATIONS: Current Outpatient Medications: acetaminophen (TYLENOL) 500 MG tablet, Take 1,000 mg by mouth 3 (three) times a day Twice daily per patient., PRN Not Currently Taking aspirin 81 MG EC tablet, Take 1 tablet (81 mg total) by mouth at bedtime, Taking calcium carbonate-vitamin D3 (CALTRATE 600+D) 600 mg-10 mcg (400 unit) tablet, Take 0.5 tablets by mouth 2 (two) times daily, Taking cetirizine (ZYRTEC) 10 mg capsule, Take 1 capsule (10 mg total) by mouth once daily,  Taking escitalopram oxalate (LEXAPRO) 10 MG tablet, Take 1 tablet (10 mg total) by mouth once daily, Taking estradioL (ESTRACE) 0.01 % (0.1 mg/gram) vaginal cream, Place 2 g vaginally twice a week, Taking folic acid/multivit,iron,miner (MULTIVIT-IRON-MIN-FOLIC ACID ORAL), Take 1 tablet by mouth once daily, Taking gabapentin (NEURONTIN) 300 MG capsule, Take 1 capsule (300 mg total) by mouth 3 (three) times daily Phantom limb pain, Taking melatonin 3 mg tablet, Take 1 tablet (3 mg total) by mouth at bedtime, Taking multivitamin with iron (COMPLETE MULTIVITAMIN-MINERAL) tablet, Take 1 tablet by mouth, Taking multivitamin with minerals, EYE, (PRESERVISION AREDS 2) soft gel capsule, Take 1 capsule by mouth 2 (two) times daily, Taking nystatin (MYCOSTATIN) 100,000 unit/gram powder, Apply small amount topically twice daily for rash under breasts, PRN Not Currently Taking pantoprazole (PROTONIX) 40 MG DR tablet, Take 1 tablet (40 mg total) by mouth 2 (two) times daily. Please call to schedule an appt. Thanks!, Taking polyethylene glycol (MIRALAX) powder, Take 17 g by mouth once daily Mix in 4-8ounces of fluid prior to taking., Taking sennosides-docusate (SENOKOT-S) 8.6-50 mg tablet, Take 1 tablet by mouth 2 (two) times daily For constipation, Taking lactose-reduced food (ENSURE PLUS ORAL), Take 237 mLs by mouth 2 (two) times daily With breakfast and dinner  HPI   CLINICAL SUMMARY:  Patient post right AKA and working with prosthesis. 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 Able to do housework, improving  PAIN:  Are you having pain? Yes: NPRS scale: 5/10 Pain  location: L knee Pain description: aching Aggravating factors: walking Relieving factors: rest  PRECAUTIONS: None  WEIGHT BEARING RESTRICTIONS No  FALLS:  Has patient fallen in last 6 months? No  LIVING ENVIRONMENT: Lives with: lives with their spouse Lives in: House/apartment Stairs: No Has following  equipment at home: Gilford Rile - 4 wheeled and Wheelchair (manual)  OCCUPATION: retired  PLOF: Requires assistive device for independence  PATIENT GOALS  improve standing tolerance/ pull up pants/ ambulate with [place] safely.     OBJECTIVE:   PATIENT SURVEYS:  FOTO initial 46/ goal 77.    COGNITION:  Overall cognitive status: Within functional limits for tasks assessed     SENSATION: WFL  POSTURE: rounded shoulders, forward head, flexed trunk , and weight shift left  PALPATION: No tenderness along R distal residual limb.  Discussed phantom limb sensation.    LOWER EXTREMITY ROM:  B LE AROM WFL except L knee extension secondary to articulating spacer.  Did not assess L/R hip extension at this time.    LOWER EXTREMITY MMT:  MMT Right eval Left eval  Hip flexion 4/5 4/5  Hip extension    Hip abduction 4+/5 4+/5  Hip adduction 4+/5 4+/5  Hip internal rotation    Hip external rotation    Knee flexion N/A 4+/5  Knee extension N/A 4+/5  Ankle dorsiflexion  5/5  Ankle plantarflexion    Ankle inversion    Ankle eversion     (Blank rows = not tested)  GAIT: Distance walked: in gym/ //-bars Assistive device utilized: Environmental consultant - 2 wheeled Level of assistance: CGA Comments: Cuing for posture correction with mirror feedback.  Flexed posture/ heavy UE assist.    TODAY'S TREATMENT:  09/23/21  Subjective:  Pt. Had f/u with Staci Righter and he trimmed gel liner for a better fit.  Pt. Brought in ply sock bag.     Objective:  Gait training:   Pt. Donned prosthetic leg with mod. I in w/c prior to standing/walking in //-bars.     Standing wt. Shifting in //-bars with heavy UE assist/ mirror feedback to correct posture.   Ambulate in //-bars with use of prosthetic leg and recip. Pattern.  Working on recip. Gait/ consistent step length/ heel strike 6 laps.  Cuing to correct BOS/ posture.  Seated rest break.     Ambulate in clinic with RW/ min. A and moderate verbal cuing to correct  upright posture.  Good R hip flexion/ step length/ prosthetic leg use.    Step ups at stairs with B UE assist.  L LE up/ R LE down. 3x.  Moderate forward flexed posture.   No Nustep today.    Amb. From stairs with RW and min. A for safety/ verbal cuing to front door.  Pt. has good control of R knee mechanism.    Mod. I with car transfers from w/c to passenger seat.    There.ex.:   Seated marching 20x.  Seated hip abduction/ adduction with manual isometrics 10x 5 sec. Holds.     STS from gray chair with B UE assist/ //-bars with SBA/mod. I safely.  Extra time required/ heavy L LE use.  Increase L knee pain with return to sit.  8x.       PATIENT EDUCATION:  Education details: HEP/ gait and prosthetic training.  Person educated: Patient and Spouse Education method: Explanation, Demonstration, and Verbal cues Education comprehension: verbalized understanding, returned demonstration, and tactile cues required   HOME EXERCISE PROGRAM: Prone position hip stretches.  ASSESSMENT:  CLINICAL IMPRESSION: Pt. Ambulates around PT clinic with min. A and use of RW or //-bars.  Significant forward head/ rounded shoulder/ flexed posture during standing and gait.  Pt. Able to correct upright posture with verbal cuing/ mirror feedback. Pt. Requires heavy UE assist/ use of L LE to stand from chair and correct posture.  No LOB during tx. Session.  Moderate fatigue towards end of tx. Session, after stair training   Pt. Will benefit from skilled PT services to improve standing tolerance/ gait independence and ADL.    OBJECTIVE IMPAIRMENTS Abnormal gait, decreased activity tolerance, decreased balance, decreased endurance, decreased mobility, difficulty walking, decreased ROM, decreased strength, decreased safety awareness, impaired flexibility, improper body mechanics, prosthetic dependency , and pain.   ACTIVITY LIMITATIONS carrying, lifting, standing, squatting, stairs, transfers, bed mobility,  bathing, toileting, dressing, hygiene/grooming, and locomotion level  PARTICIPATION LIMITATIONS: cleaning, laundry, driving, shopping, community activity, and yard work  [place] Past/current experiences are also affecting patient's functional outcome.   REHAB POTENTIAL: Good  CLINICAL DECISION MAKING: Evolving/moderate complexity  EVALUATION COMPLEXITY: High   GOALS: Goals reviewed with patient? Yes  SHORT TERM GOALS: Target date: 10/07/21 Pt. Able to don/doff R prosthetic leg independently to improve ADL/standing tolerance.  Baseline:  min. A to don/ doff prosthetic leg Goal status: INITIAL   LONG TERM GOALS: Target date: 12/02/21  Pt. Will increase FOTO to 54 to improve functional mobility.  Baseline: initial FOTO 46 Goal status: INITIAL  2.  Pt. Able to tolerate standing 10 minutes while pulling up pants with mod. I safely while wearing prosthetic leg to improve daily activities.  Baseline:  limited standing tolerance Goal status: INITIAL  3.  Pt. Will ambulate 200 feet with use of SPC and mod. I to promote safety with household tasks/ walking into bathroom.   Baseline:  amb. With RW and min. A for safety.   Goal status: INITIAL   PLAN: PT FREQUENCY: 2x/week  PT DURATION: 12 weeks  PLANNED INTERVENTIONS: Therapeutic exercises, Therapeutic activity, Neuromuscular re-education, Balance training, Gait training, Patient/Family education, Self Care, Joint mobilization, Prosthetic training, DME instructions, and Manual therapy  PLAN FOR NEXT SESSION: Nustep/ step ups  Pura Spice, PT, DPT # [id number] 09/25/2021, 8:54 AM

## 2021-09-28 ENCOUNTER — Ambulatory Visit: Payer: Medicare Other | Admitting: Physical Therapy

## 2021-09-28 DIAGNOSIS — R269 Unspecified abnormalities of gait and mobility: Secondary | ICD-10-CM

## 2021-09-28 DIAGNOSIS — R293 Abnormal posture: Secondary | ICD-10-CM

## 2021-09-28 DIAGNOSIS — Z89611 Acquired absence of right leg above knee: Secondary | ICD-10-CM

## 2021-09-28 DIAGNOSIS — M6281 Muscle weakness (generalized): Secondary | ICD-10-CM

## 2021-09-29 NOTE — Therapy (Signed)
OUTPATIENT PHYSICAL THERAPY LOWER EXTREMITY TREATMENT   Patient Name: Jasmine Morrow MRN: [id number] DOB:1945-09-26, 76 y.o., female Today's Date: 09/28/2021   PT End of Session - 09/29/21 0816     Visit Number 5    Number of Visits 24    Date for PT Re-Evaluation 12/02/21    PT Start Time 1722    PT Stop Time 1820    PT Time Calculation (min) 58 min             Past Medical History:  Diagnosis Date   Anemia    Anxiety    Asthma    seasonal    GERD (gastroesophageal reflux disease)    Hypertension    PONV (postoperative nausea and vomiting)    Seasonal allergies    Sleep apnea    does not uses CPAP machine anymore    Past Surgical History:  Procedure Laterality Date   APPENDECTOMY     CESAREAN SECTION     CHOLECYSTECTOMY     DILATION AND CURETTAGE OF UTERUS     ESOPHAGOGASTRODUODENOSCOPY N/A 02/22/2019   Procedure: ESOPHAGOGASTRODUODENOSCOPY (EGD);  Surgeon: Toledo, Benay Pike, MD;  Location: [facility];  Service: Gastroenterology;  Laterality: N/A;   FLEXIBLE SIGMOIDOSCOPY N/A 02/22/2019   Procedure: FLEXIBLE SIGMOIDOSCOPY;  Surgeon: Toledo, Benay Pike, MD;  Location: [facility];  Service: Gastroenterology;  Laterality: N/A;   HARDWARE REMOVAL Right 11/12/2019   Procedure: Right ankle hardware removal;  Surgeon: Hessie Knows, MD;  Location: [facility];  Service: Orthopedics;  Laterality: Right;   HARDWARE REMOVAL Right 04/17/2020   Procedure: HARDWARE REMOVAL FROM RIGHT ANKLE AND RIGHT KNEE; IMPLANT OF CEMENT SPACERS IN RIGHT KNEE;  Surgeon: Dereck Leep, MD;  Location: [facility];  Service: Orthopedics;  Laterality: Right;   JOINT REPLACEMENT     ORIF ANKLE FRACTURE Right 03/14/2019   Procedure: OPEN REDUCTION INTERNAL FIXATION (ORIF) ANKLE FRACTURE, MEDIAL MALLEOLUS;  Surgeon: Hessie Knows, MD;  Location: [facility];  Service: Orthopedics;  Laterality: Right;   ORIF ANKLE FRACTURE Right 02/08/2019   Procedure: OPEN REDUCTION INTERNAL FIXATION (ORIF) ANKLE  FRACTURE, post malleolus;  Surgeon: Hessie Knows, MD;  Location: [facility];  Service: Orthopedics;  Laterality: Right;   REPLACEMENT TOTAL KNEE Bilateral 2008   2009   SCAR DEBRIDEMENT OF TOTAL KNEE  01/2020   SYNDESMOSIS REPAIR Right 02/08/2019   Procedure: SYNDESMOSIS REPAIR;  Surgeon: Hessie Knows, MD;  Location: [facility];  Service: Orthopedics;  Laterality: Right;   TEE WITHOUT CARDIOVERSION N/A 02/26/2019   Procedure: TRANSESOPHAGEAL ECHOCARDIOGRAM (TEE);  Surgeon: Corey Skains, MD;  Location: [facility];  Service: Cardiovascular;  Laterality: N/A;   TOTAL KNEE REVISION WITH SCAR DEBRIDEMENT/PATELLA REVISION WITH POLY EXCHANGE Right 01/24/2020   Procedure: TOTAL KNEE REVISION WITH SCAR DEBRIDEMENT/PATELLA REVISION WITH POLY EXCHANGE;  Surgeon: Dereck Leep, MD;  Location: [facility];  Service: Orthopedics;  Laterality: Right;   TUMOR REMOVAL     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   Patient Active Problem List   Diagnosis Date Noted   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[place]) 04/17/2020   Anxiety 04/17/2020   History of GI diverticular bleed 04/17/2020   Chest pain 04/17/2020   PAD (peripheral artery disease) ([place]) 04/05/2020   Ulcer of right leg ([place]) 04/05/2020   COPD (chronic obstructive pulmonary disease) ([place]) 04/05/2020   GERD (gastroesophageal reflux disease) 04/05/2020   Abscess of right knee 01/22/2020   Pressure injury of skin 02/28/2019   SOB (shortness of breath)  Gastrointestinal hemorrhage    Sepsis ([place])    Symptomatic anemia 02/20/2019   Trimalleolar fracture of ankle, closed, right, initial encounter 02/07/2019   Multiple lung nodules 03/31/2014   Extrinsic asthma 03/27/2014   OSA on CPAP 03/27/2014    PCP: Rusty Aus, MD  REFERRING PROVIDER: Rusty Aus, MD  REFERRING DIAG: Above knee amputation of right lower extremity  THERAPY DIAG:  S/P AKA (above knee amputation) unilateral, right (HCC)  Muscle weakness (generalized)  Gait  difficulty  Abnormal posture  Rationale for Evaluation and Treatment Rehabilitation  ONSET DATE: 12/02/20  SUBJECTIVE:   SUBJECTIVE STATEMENT:  Evaluation Pt. Arrived to PT with use of w/c.  Pt. Has had prosthesis for a couple months and working with [facility].  S/p L TKA (articulating spacer)- increase pain with walking 6/10.  No LBP.    PERTINENT HISTORY: Patient Profile:   Jasmine Morrow is a 76 y.o. female Chief Complaint  Patient presents with  Visit Follow Up  Doing well.   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 Cardiac cath 10/2011 showed completely normal coronary arteries, however she did have mild left ventricular dysfunction with anterior wall motion abnormality. Left ventricular EF was 51%.  History of chickenpox  Hyperlipidemia  patient states she has never been diagnosed with this  Hypertension  Knee joint replacement by other means  Macular degeneration  Major depressive disorder, recurrent, mild (CMS-HCC)  Medicare annual wellness visit, initial 08/09/2019  7/21  Multiple thyroid nodules  Normal coronary arteries 09/06/2013  Normal coronary anatomy by cardiac catheterization 10/12/11  Osteoarthritis  Ovarian cyst  PONV (postoperative nausea and vomiting)  nausea  Postoperative urinary retention 12/08/2020  Rash on lips 12/21/2020  Seasonal allergies  Sinusitis,  unspecified  Sleep apnea  On CPAP.  Slow transit constipation 12/10/2020  Trimalleolar fracture of ankle, closed, right, initial encounter 86/57/8469  Complicated by staph infection 1/21, 4 weeks IV Ancef, [place]  Ulcer   Past Surgical History:  Procedure Laterality Date  SALPINGO OOPHORECTOMY Bilateral 1993  Right total knee arthroplasty 10/11/2006  Dr Marry Guan  Left total knee arthroplasty 10/12/2007  Dr Marry Guan  COLONOSCOPY 07/30/2012  internal hemorrhoids, diverticulosis  ANKLE ARTHODESIS W/ ARTHROSCOPY Right 02/08/2019  03/14/2019 second surgery  COLONOSCOPY 02/22/2019  Blood in entire colon/Diverticulosis - Presumed diverticular bleed. No repeat recommended per TKT.  EGD 02/22/2019  Gastritis/gastric ulcers/Hiatal hernia/Otherwise normal - no repeat recommended per TKT.  ORIF ANKLE FRACTURE Right 03/14/2019  Dr. Rudene Christians  REMOVAL HARDWARE ANKLE FOOT/TOES Right 11/12/2019  Dr. Rudene Christians  Right knee arthrotomy, irrigation and debridement of the right knee, polyethylene exchange 01/24/2020  Dr Marry Guan  Removal of hardware (plates and screws) from the right ankle with irrigation, debridement, and placement of Stimulan antibiotic beads 04/17/2020  Dr Marry Guan  Right knee arthrotomy, extensive irrigation debridement, removal of right total knee implants, and placement of antibiotic impregnated polymethylmethacrylate cement spacer 04/17/2020  Dr Marry Guan  AMPUTATION LEG ABOVE KNEE AKA Right 12/02/2020  Procedure:  AMPUTATION, THIGH, THROUGH FEMUR, ANY LEVEL; Surgeon: Lyndle Herrlich, MD; Location: [place]; Service: Orthopedics; Laterality: Right;  TRANSFER ADJACENT TISSUE LEG Right 12/02/2020  Procedure: ADJACENT TISSUE TRANSFER OR REARRANGEMENT, LEG; DEFECT 10 SQ CM OR LESS; Surgeon: Lyndle Herrlich, MD; Location: [place]; Service: Orthopedics; Laterality: Right;  ASPIRATION/INJECTION MAJOR JOINT/BURSA KNEE Left 12/02/2020  Procedure: ARTHROCENTESIS, ASPIRATION AND/OR  INJECTION, MAJOR JOINT OR BURSA, KNEE; WITHOUT ULTRASOUND GUIDANCE; Surgeon: Lyndle Herrlich, MD; Location: [place]; Service: Orthopedics; Laterality: Left;  REMOVAL KNEE PROSTHESIS W/POSSIBLE PLACEMENT SPACER Left 01/18/2021  Procedure: REMOVAL OF PROSTHESIS, INCLUDING TOTAL KNEE PROSTHESIS, METHYLMETHACRYLATE WITH OR WITHOUT INSERTION OF SPACER, KNEE; Surgeon: Ihor Dow, MD; Location: [place]; Service: Orthopedics; Laterality: Left;  INSERTION NON-BIODEGRADABLE DRUG DELIVERY IMPLANT Left 01/18/2021  Procedure: INSERTION, KNEE, bioresorbable, biodegradable, NON-BIODEGRADABLE DRUG DELIVERY IMPLANT; Surgeon: Ihor Dow, MD; Location: [place]; Service: Orthopedics; Laterality: Left;  ABOVE KNEE LEG AMPUTATION  12/02/2020  APPENDECTOMY Est 1968  CESAREAN SECTION  CHOLECYSTECTOMY  FRACTURE SURGERY 1/21  HYSTERECTOMY partial  JOINT REPLACEMENT 2008 & 2009  TUBAL LIGATION 3/79   ALLERGIES: Allergies  Allergen Reactions  Singulair [Montelukast] Other (See Comments)  Gland swelling  Sulfa (Sulfonamide Antibiotics) Swelling  Oxycodone Dizziness and Other (See Comments)  Vancomycin Analogues Other (See Comments)  Ringing in ears  Avinza [Morphine] Itching  Darvocet A500 [Propoxyphene N-Acetaminophen] Nausea  Nickel Rash  Ultracet [Tramadol-Acetaminophen] Rash  Vicodin [Hydrocodone-Acetaminophen] Vomiting  Vioxx [Rofecoxib] Other (See Comments)  GI   CURRENT MEDICATIONS: Current Outpatient Medications: acetaminophen (TYLENOL) 500 MG tablet, Take 1,000 mg by mouth 3 (three) times a day Twice daily per patient., PRN Not Currently Taking aspirin 81 MG EC tablet, Take 1 tablet (81 mg total) by mouth at bedtime, Taking calcium carbonate-vitamin D3 (CALTRATE 600+D) 600 mg-10 mcg (400 unit) tablet, Take 0.5 tablets by mouth 2 (two) times daily, Taking cetirizine (ZYRTEC) 10 mg capsule, Take 1 capsule (10 mg total) by mouth once daily,  Taking escitalopram oxalate (LEXAPRO) 10 MG tablet, Take 1 tablet (10 mg total) by mouth once daily, Taking estradioL (ESTRACE) 0.01 % (0.1 mg/gram) vaginal cream, Place 2 g vaginally twice a week, Taking folic acid/multivit,iron,miner (MULTIVIT-IRON-MIN-FOLIC ACID ORAL), Take 1 tablet by mouth once daily, Taking gabapentin (NEURONTIN) 300 MG capsule, Take 1 capsule (300 mg total) by mouth 3 (three) times daily Phantom limb pain, Taking melatonin 3 mg tablet, Take 1 tablet (3 mg total) by mouth at bedtime, Taking multivitamin with iron (COMPLETE MULTIVITAMIN-MINERAL) tablet, Take 1 tablet by mouth, Taking multivitamin with minerals, EYE, (PRESERVISION AREDS 2) soft gel capsule, Take 1 capsule by mouth 2 (two) times daily, Taking nystatin (MYCOSTATIN) 100,000 unit/gram powder, Apply small amount topically twice daily for rash under breasts, PRN Not Currently Taking pantoprazole (PROTONIX) 40 MG DR tablet, Take 1 tablet (40 mg total) by mouth 2 (two) times daily. Please call to schedule an appt. Thanks!, Taking polyethylene glycol (MIRALAX) powder, Take 17 g by mouth once daily Mix in 4-8ounces of fluid prior to taking., Taking sennosides-docusate (SENOKOT-S) 8.6-50 mg tablet, Take 1 tablet by mouth 2 (two) times daily For constipation, Taking lactose-reduced food (ENSURE PLUS ORAL), Take 237 mLs by mouth 2 (two) times daily With breakfast and dinner  HPI   CLINICAL SUMMARY:  Patient post right AKA and working with prosthesis. 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 Able to do housework, improving  PAIN:  Are you having pain? Yes: NPRS scale: 5/10 Pain  location: L knee Pain description: aching Aggravating factors: walking Relieving factors: rest  PRECAUTIONS: None  WEIGHT BEARING RESTRICTIONS No  FALLS:  Has patient fallen in last 6 months? No  LIVING ENVIRONMENT: Lives with: lives with their spouse Lives in: House/apartment Stairs: No Has following  equipment at home: Gilford Rile - 4 wheeled and Wheelchair (manual)  OCCUPATION: retired  PLOF: Requires assistive device for independence  PATIENT GOALS  improve standing tolerance/ pull up pants/ ambulate with [place] safely.     OBJECTIVE:   PATIENT SURVEYS:  FOTO initial 46/ goal 33.    COGNITION:  Overall cognitive status: Within functional limits for tasks assessed     SENSATION: WFL  POSTURE: rounded shoulders, forward head, flexed trunk , and weight shift left  PALPATION: No tenderness along R distal residual limb.  Discussed phantom limb sensation.    LOWER EXTREMITY ROM:  B LE AROM WFL except L knee extension secondary to articulating spacer.  Did not assess L/R hip extension at this time.    LOWER EXTREMITY MMT:  MMT Right eval Left eval  Hip flexion 4/5 4/5  Hip extension    Hip abduction 4+/5 4+/5  Hip adduction 4+/5 4+/5  Hip internal rotation    Hip external rotation    Knee flexion N/A 4+/5  Knee extension N/A 4+/5  Ankle dorsiflexion  5/5  Ankle plantarflexion    Ankle inversion    Ankle eversion     (Blank rows = not tested)  GAIT: Distance walked: in gym/ //-bars Assistive device utilized: Environmental consultant - 2 wheeled Level of assistance: CGA Comments: Cuing for posture correction with mirror feedback.  Flexed posture/ heavy UE assist.    TODAY'S TREATMENT:  09/28/21  Subjective:  Pt. Reports no new complaints.  Pt. Discussed ply sock use with PT and will return to use of 3 ply sock for better prosthesis fit.     Objective:  Gait training:   Pt. Donned prosthetic leg with mod. I in w/c prior to standing/walking in with RW.  Ambulate in clinic with RW/ min. A and moderate verbal cuing to correct upright posture.  Good R hip flexion/ step length/ prosthetic leg use.   Use of mirror at //-bars to correct posture/ step length.    Step to gait at stairs with B UE assist.  L LE up/ R LE down. 3x.  Moderate forward flexed posture.      Amb. From N[place] with  RW and min. A for safety/ verbal cuing to car at front of building.  Pt. has good control of R knee mechanism.    Mod. I with car transfers from w/c to passenger seat.    There.ex.:   Seated marching 20x.       STS from mat table/ gray chair with B UE assist/ //-bars with SBA/mod. I safely.  Extra time required/ heavy L LE use.  Increase L knee pain with return to sit.     Nustep L1 10 min. B UE/LE.  Consistent cadence for muscle endurance.       PATIENT EDUCATION:  Education details: HEP/ gait and prosthetic training.  Person educated: Patient and Spouse Education method: Explanation, Demonstration, and Verbal cues Education comprehension: verbalized understanding, returned demonstration, and tactile cues required   HOME EXERCISE PROGRAM: Prone position hip stretches.    ASSESSMENT:  CLINICAL IMPRESSION: Pt. Ambulates around PT clinic with min. A and use of RW or //-bars.  Significant forward head/ rounded shoulder/ flexed posture during standing and gait.  Pt. Able to correct upright posture with verbal cuing/ mirror feedback. Pt. Requires heavy UE assist/ use of L LE to stand from chair and correct posture.  No LOB during tx. Session.  Pt. Able to complete 3 reps at stairs with step to gait pattern.  No increase c/o pain during Nustep or gait.  Pt. Will benefit from skilled PT services to improve standing tolerance/ gait independence and ADL.    OBJECTIVE IMPAIRMENTS Abnormal gait, decreased activity tolerance, decreased balance, decreased endurance, decreased mobility, difficulty walking, decreased ROM, decreased strength, decreased safety awareness, impaired flexibility, improper body mechanics, prosthetic dependency , and pain.   ACTIVITY LIMITATIONS carrying, lifting, standing, squatting, stairs, transfers, bed mobility, bathing, toileting, dressing, hygiene/grooming, and locomotion level  PARTICIPATION LIMITATIONS: cleaning, laundry, driving, shopping, community activity,  and yard work  [place] Past/current experiences are also affecting patient's functional outcome.   REHAB POTENTIAL: Good  CLINICAL DECISION MAKING: Evolving/moderate complexity  EVALUATION COMPLEXITY: High   GOALS: Goals reviewed with patient? Yes  SHORT TERM GOALS: Target date: 10/07/21 Pt. Able to don/doff R prosthetic leg independently to improve ADL/standing tolerance.  Baseline:  min. A to don/ doff prosthetic leg Goal status: INITIAL   LONG TERM GOALS: Target date: 12/02/21  Pt. Will increase FOTO to 54 to improve functional mobility.  Baseline: initial FOTO 46 Goal status: INITIAL  2.  Pt. Able to tolerate standing 10 minutes while pulling up pants with mod. I safely while wearing prosthetic leg to improve daily activities.  Baseline:  limited standing tolerance Goal status: INITIAL  3.  Pt. Will ambulate 200 feet with use of SPC and mod. I to promote safety with household tasks/ walking into bathroom.   Baseline:  amb. With RW and min. A for safety.   Goal status: INITIAL   PLAN: PT FREQUENCY: 2x/week  PT DURATION: 12 weeks  PLANNED INTERVENTIONS: Therapeutic exercises, Therapeutic activity, Neuromuscular re-education, Balance training, Gait training, Patient/Family education, Self Care, Joint mobilization, Prosthetic training, DME instructions, and Manual therapy  PLAN FOR NEXT SESSION:  Issue posture ex. For HEP  Pura Spice, PT, DPT # [phone] 09/29/2021, 8:18 AM

## 2021-09-30 ENCOUNTER — Encounter: Payer: Medicare Other | Admitting: Physical Therapy

## 2021-10-01 ENCOUNTER — Encounter: Payer: Medicare Other | Admitting: Physical Therapy

## 2021-10-05 ENCOUNTER — Encounter: Payer: Medicare Other | Admitting: Physical Therapy

## 2021-10-06 ENCOUNTER — Ambulatory Visit: Payer: Medicare Other | Admitting: Physical Therapy

## 2021-10-07 ENCOUNTER — Ambulatory Visit: Payer: Medicare Other | Attending: Internal Medicine | Admitting: Physical Therapy

## 2021-10-07 DIAGNOSIS — R293 Abnormal posture: Secondary | ICD-10-CM | POA: Insufficient documentation

## 2021-10-07 DIAGNOSIS — R269 Unspecified abnormalities of gait and mobility: Secondary | ICD-10-CM | POA: Diagnosis present

## 2021-10-07 DIAGNOSIS — Z89611 Acquired absence of right leg above knee: Secondary | ICD-10-CM | POA: Diagnosis present

## 2021-10-07 DIAGNOSIS — M6281 Muscle weakness (generalized): Secondary | ICD-10-CM | POA: Insufficient documentation

## 2021-10-09 NOTE — Therapy (Signed)
OUTPATIENT PHYSICAL THERAPY LOWER EXTREMITY TREATMENT   Patient Name: Jasmine Morrow MRN: [id number] DOB:Nov 05, 1945, 76 y.o., female Today's Date: 10/07/2021   PT End of Session - 10/09/21 1741     Visit Number 6    Number of Visits 24    Date for PT Re-Evaluation 12/02/21    PT Start Time 1025    PT Stop Time 1120    PT Time Calculation (min) 55 min    Activity Tolerance Patient tolerated treatment well             Past Medical History:  Diagnosis Date   Anemia    Anxiety    Asthma    seasonal    GERD (gastroesophageal reflux disease)    Hypertension    PONV (postoperative nausea and vomiting)    Seasonal allergies    Sleep apnea    does not uses CPAP machine anymore    Past Surgical History:  Procedure Laterality Date   APPENDECTOMY     CESAREAN SECTION     CHOLECYSTECTOMY     DILATION AND CURETTAGE OF UTERUS     ESOPHAGOGASTRODUODENOSCOPY N/A 02/22/2019   Procedure: ESOPHAGOGASTRODUODENOSCOPY (EGD);  Surgeon: Toledo, Benay Pike, MD;  Location: [facility];  Service: Gastroenterology;  Laterality: N/A;   FLEXIBLE SIGMOIDOSCOPY N/A 02/22/2019   Procedure: FLEXIBLE SIGMOIDOSCOPY;  Surgeon: Toledo, Benay Pike, MD;  Location: [facility];  Service: Gastroenterology;  Laterality: N/A;   HARDWARE REMOVAL Right 11/12/2019   Procedure: Right ankle hardware removal;  Surgeon: Hessie Knows, MD;  Location: [facility];  Service: Orthopedics;  Laterality: Right;   HARDWARE REMOVAL Right 04/17/2020   Procedure: HARDWARE REMOVAL FROM RIGHT ANKLE AND RIGHT KNEE; IMPLANT OF CEMENT SPACERS IN RIGHT KNEE;  Surgeon: Dereck Leep, MD;  Location: [facility];  Service: Orthopedics;  Laterality: Right;   JOINT REPLACEMENT     ORIF ANKLE FRACTURE Right 03/14/2019   Procedure: OPEN REDUCTION INTERNAL FIXATION (ORIF) ANKLE FRACTURE, MEDIAL MALLEOLUS;  Surgeon: Hessie Knows, MD;  Location: [facility];  Service: Orthopedics;  Laterality: Right;   ORIF ANKLE FRACTURE Right 02/08/2019    Procedure: OPEN REDUCTION INTERNAL FIXATION (ORIF) ANKLE FRACTURE, post malleolus;  Surgeon: Hessie Knows, MD;  Location: [facility];  Service: Orthopedics;  Laterality: Right;   REPLACEMENT TOTAL KNEE Bilateral 2008   2009   SCAR DEBRIDEMENT OF TOTAL KNEE  01/2020   SYNDESMOSIS REPAIR Right 02/08/2019   Procedure: SYNDESMOSIS REPAIR;  Surgeon: Hessie Knows, MD;  Location: [facility];  Service: Orthopedics;  Laterality: Right;   TEE WITHOUT CARDIOVERSION N/A 02/26/2019   Procedure: TRANSESOPHAGEAL ECHOCARDIOGRAM (TEE);  Surgeon: Corey Skains, MD;  Location: [facility];  Service: Cardiovascular;  Laterality: N/A;   TOTAL KNEE REVISION WITH SCAR DEBRIDEMENT/PATELLA REVISION WITH POLY EXCHANGE Right 01/24/2020   Procedure: TOTAL KNEE REVISION WITH SCAR DEBRIDEMENT/PATELLA REVISION WITH POLY EXCHANGE;  Surgeon: Dereck Leep, MD;  Location: [facility];  Service: Orthopedics;  Laterality: Right;   TUMOR REMOVAL     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   Patient Active Problem List   Diagnosis Date Noted   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[place]) 04/17/2020   Anxiety 04/17/2020   History of GI diverticular bleed 04/17/2020   Chest pain 04/17/2020   PAD (peripheral artery disease) ([place]) 04/05/2020   Ulcer of right leg ([place]) 04/05/2020   COPD (chronic obstructive pulmonary disease) ([place]) 04/05/2020   GERD (gastroesophageal reflux disease) 04/05/2020   Abscess of right knee 01/22/2020   Pressure injury of skin 02/28/2019  SOB (shortness of breath)    Gastrointestinal hemorrhage    Sepsis ([place])    Symptomatic anemia 02/20/2019   Trimalleolar fracture of ankle, closed, right, initial encounter 02/07/2019   Multiple lung nodules 03/31/2014   Extrinsic asthma 03/27/2014   OSA on CPAP 03/27/2014    PCP: Rusty Aus, MD  REFERRING PROVIDER: Rusty Aus, MD  REFERRING DIAG: Above knee amputation of right lower extremity  THERAPY DIAG:  S/P AKA (above knee amputation) unilateral, right  (HCC)  Muscle weakness (generalized)  Gait difficulty  Abnormal posture  Rationale for Evaluation and Treatment Rehabilitation  ONSET DATE: 12/02/20  SUBJECTIVE:   SUBJECTIVE STATEMENT:  Evaluation Pt. Arrived to PT with use of w/c.  Pt. Has had prosthesis for a couple months and working with [place].  S/p L TKA (articulating spacer)- increase pain with walking 6/10.  No LBP.    PERTINENT HISTORY: Patient Profile:   Jasmine Morrow is a 76 y.o. female Chief Complaint  Patient presents with  Visit Follow Up  Doing well.   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 Cardiac cath 10/2011 showed completely normal coronary arteries, however she did have mild left ventricular dysfunction with anterior wall motion abnormality. Left ventricular EF was 51%.  History of chickenpox  Hyperlipidemia  patient states she has never been diagnosed with this  Hypertension  Knee joint replacement by other means  Macular degeneration  Major depressive disorder, recurrent, mild (CMS-HCC)  Medicare annual wellness visit, initial 08/09/2019  7/21  Multiple thyroid nodules  Normal coronary arteries 09/06/2013  Normal coronary anatomy by cardiac catheterization 10/12/11  Osteoarthritis  Ovarian cyst  PONV (postoperative nausea and vomiting)  nausea  Postoperative urinary retention 12/08/2020  Rash on lips  12/21/2020  Seasonal allergies  Sinusitis, unspecified  Sleep apnea  On CPAP.  Slow transit constipation 12/10/2020  Trimalleolar fracture of ankle, closed, right, initial encounter 67/67/2094  Complicated by staph infection 1/21, 4 weeks IV Ancef, [place]  Ulcer   Past Surgical History:  Procedure Laterality Date  SALPINGO OOPHORECTOMY Bilateral 1993  Right total knee arthroplasty 10/11/2006  Dr Marry Guan  Left total knee arthroplasty 10/12/2007  Dr Marry Guan  COLONOSCOPY 07/30/2012  internal hemorrhoids, diverticulosis  ANKLE ARTHODESIS W/ ARTHROSCOPY Right 02/08/2019  03/14/2019 second surgery  COLONOSCOPY 02/22/2019  Blood in entire colon/Diverticulosis - Presumed diverticular bleed. No repeat recommended per TKT.  EGD 02/22/2019  Gastritis/gastric ulcers/Hiatal hernia/Otherwise normal - no repeat recommended per TKT.  ORIF ANKLE FRACTURE Right 03/14/2019  Dr. Rudene Christians  REMOVAL HARDWARE ANKLE FOOT/TOES Right 11/12/2019  Dr. Rudene Christians  Right knee arthrotomy, irrigation and debridement of the right knee, polyethylene exchange 01/24/2020  Dr Marry Guan  Removal of hardware (plates and screws) from the right ankle with irrigation, debridement, and placement of Stimulan antibiotic beads 04/17/2020  Dr Marry Guan  Right knee arthrotomy, extensive irrigation debridement, removal of right total knee implants, and placement of antibiotic impregnated polymethylmethacrylate cement spacer 04/17/2020  Dr Marry Guan  AMPUTATION LEG  ABOVE KNEE AKA Right 12/02/2020  Procedure: AMPUTATION, THIGH, THROUGH FEMUR, ANY LEVEL; Surgeon: Lyndle Herrlich, MD; Location: [place]; Service: Orthopedics; Laterality: Right;  TRANSFER ADJACENT TISSUE LEG Right 12/02/2020  Procedure: ADJACENT TISSUE TRANSFER OR REARRANGEMENT, LEG; DEFECT 10 SQ CM OR LESS; Surgeon: Lyndle Herrlich, MD; Location: [place]; Service: Orthopedics; Laterality: Right;  ASPIRATION/INJECTION MAJOR JOINT/BURSA KNEE Left 12/02/2020   Procedure: ARTHROCENTESIS, ASPIRATION AND/OR INJECTION, MAJOR JOINT OR BURSA, KNEE; WITHOUT ULTRASOUND GUIDANCE; Surgeon: Lyndle Herrlich, MD; Location: [place]; Service: Orthopedics; Laterality: Left;  REMOVAL KNEE PROSTHESIS W/POSSIBLE PLACEMENT SPACER Left 01/18/2021  Procedure: REMOVAL OF PROSTHESIS, INCLUDING TOTAL KNEE PROSTHESIS, METHYLMETHACRYLATE WITH OR WITHOUT INSERTION OF SPACER, KNEE; Surgeon: Ihor Dow, MD; Location: [place]; Service: Orthopedics; Laterality: Left;  INSERTION NON-BIODEGRADABLE DRUG DELIVERY IMPLANT Left 01/18/2021  Procedure: INSERTION, KNEE, bioresorbable, biodegradable, NON-BIODEGRADABLE DRUG DELIVERY IMPLANT; Surgeon: Ihor Dow, MD; Location: [place]; Service: Orthopedics; Laterality: Left;  ABOVE KNEE LEG AMPUTATION  12/02/2020  APPENDECTOMY Est 1968  CESAREAN SECTION  CHOLECYSTECTOMY  FRACTURE SURGERY 1/21  HYSTERECTOMY partial  JOINT REPLACEMENT 2008 & 2009  TUBAL LIGATION 3/79   ALLERGIES: Allergies  Allergen Reactions  Singulair [Montelukast] Other (See Comments)  Gland swelling  Sulfa (Sulfonamide Antibiotics) Swelling  Oxycodone Dizziness and Other (See Comments)  Vancomycin Analogues Other (See Comments)  Ringing in ears  Avinza [Morphine] Itching  Darvocet A500 [Propoxyphene N-Acetaminophen] Nausea  Nickel Rash  Ultracet [Tramadol-Acetaminophen] Rash  Vicodin [Hydrocodone-Acetaminophen] Vomiting  Vioxx [Rofecoxib] Other (See Comments)  GI   CURRENT MEDICATIONS: Current Outpatient Medications: acetaminophen (TYLENOL) 500 MG tablet, Take 1,000 mg by mouth 3 (three) times a day Twice daily per patient., PRN Not Currently Taking aspirin 81 MG EC tablet, Take 1 tablet (81 mg total) by mouth at bedtime, Taking calcium carbonate-vitamin D3 (CALTRATE 600+D) 600 mg-10 mcg (400 unit) tablet, Take 0.5 tablets by mouth 2 (two) times daily, Taking cetirizine (ZYRTEC) 10 mg capsule, Take 1 capsule (10  mg total) by mouth once daily, Taking escitalopram oxalate (LEXAPRO) 10 MG tablet, Take 1 tablet (10 mg total) by mouth once daily, Taking estradioL (ESTRACE) 0.01 % (0.1 mg/gram) vaginal cream, Place 2 g vaginally twice a week, Taking folic acid/multivit,iron,miner (MULTIVIT-IRON-MIN-FOLIC ACID ORAL), Take 1 tablet by mouth once daily, Taking gabapentin (NEURONTIN) 300 MG capsule, Take 1 capsule (300 mg total) by mouth 3 (three) times daily Phantom limb pain, Taking melatonin 3 mg tablet, Take 1 tablet (3 mg total) by mouth at bedtime, Taking multivitamin with iron (COMPLETE MULTIVITAMIN-MINERAL) tablet, Take 1 tablet by mouth, Taking multivitamin with minerals, EYE, (PRESERVISION AREDS 2) soft gel capsule, Take 1 capsule by mouth 2 (two) times daily, Taking nystatin (MYCOSTATIN) 100,000 unit/gram powder, Apply small amount topically twice daily for rash under breasts, PRN Not Currently Taking pantoprazole (PROTONIX) 40 MG DR tablet, Take 1 tablet (40 mg total) by mouth 2 (two) times daily. Please call to schedule an appt. Thanks!, Taking polyethylene glycol (MIRALAX) powder, Take 17 g by mouth once daily Mix in 4-8ounces of fluid prior to taking., Taking sennosides-docusate (SENOKOT-S) 8.6-50 mg tablet, Take 1 tablet by mouth 2 (two) times daily For constipation, Taking lactose-reduced food (ENSURE PLUS ORAL), Take 237 mLs by mouth 2 (two) times daily With breakfast and dinner  HPI   CLINICAL SUMMARY:  Patient post right AKA and working with prosthesis. 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 Able to do housework, improving  PAIN:  Are you  having pain? Yes: NPRS scale: 5/10 Pain location: L knee Pain description: aching Aggravating factors: walking Relieving factors: rest  PRECAUTIONS: None  WEIGHT BEARING RESTRICTIONS No  FALLS:  Has patient fallen in last 6 months? No  LIVING ENVIRONMENT: Lives with: lives with their spouse Lives in:  House/apartment Stairs: No Has following equipment at home: Gilford Rile - 4 wheeled and Wheelchair (manual)  OCCUPATION: retired  PLOF: Requires assistive device for independence  PATIENT GOALS  improve standing tolerance/ pull up pants/ ambulate with [place] safely.     OBJECTIVE:   PATIENT SURVEYS:  FOTO initial 46/ goal 60.    COGNITION:  Overall cognitive status: Within functional limits for tasks assessed     SENSATION: WFL  POSTURE: rounded shoulders, forward head, flexed trunk , and weight shift left  PALPATION: No tenderness along R distal residual limb.  Discussed phantom limb sensation.    LOWER EXTREMITY ROM:  B LE AROM WFL except L knee extension secondary to articulating spacer.  Did not assess L/R hip extension at this time.    LOWER EXTREMITY MMT:  MMT Right eval Left eval  Hip flexion 4/5 4/5  Hip extension    Hip abduction 4+/5 4+/5  Hip adduction 4+/5 4+/5  Hip internal rotation    Hip external rotation    Knee flexion N/A 4+/5  Knee extension N/A 4+/5  Ankle dorsiflexion  5/5  Ankle plantarflexion    Ankle inversion    Ankle eversion     (Blank rows = not tested)  GAIT: Distance walked: in gym/ //-bars Assistive device utilized: Environmental consultant - 2 wheeled Level of assistance: CGA Comments: Cuing for posture correction with mirror feedback.  Flexed posture/ heavy UE assist.     TODAY'S TREATMENT:  10/07/21  Subjective:  Pt. Reports no new complaints.  Pt. Using 5 ply sock with gel liner/ prosthetic fitting.  Pt. Did not stand with RW when renewing vowels for 50th anniversary.     Objective:  There.ex.:   STS from mat table/ gray chair with B UE assist/ //-bars with SBA/mod. I safely.  Extra time required/ heavy L LE use.  Increase L knee pain with return to sit.     Seated marching in w/c 20x.   Standing wt. Shifting/ functional reaching to cones on elevated hospital table.  (Pt. Challenged with no UE assist, requires 1 UE assist for safety).      Nustep L3 10 min. B UE/LE.  Consistent cadence for muscle endurance.    Gait training:  Ambulate in clinic with RW/ min. A and moderate verbal cuing to correct upright posture.  Good R hip flexion/ step length/ prosthetic leg use.   Use of mirror at //-bars to correct posture/ step length.   6 laps.    Ambulate in //-bars with added 6" step ups/ downs 4 laps.    Amb. From Nustep with RW and min. A for safety/ verbal cuing to car at front of building.  Pt. has good control of R knee mechanism.    Mod. I with car transfers from w/c to passenger seat.       PATIENT EDUCATION:  Education details: HEP/ gait and prosthetic training.  Person educated: Patient and Spouse Education method: Explanation, Demonstration, and Verbal cues Education comprehension: verbalized understanding, returned demonstration, and tactile cues required   HOME EXERCISE PROGRAM: Prone position hip stretches.    ASSESSMENT:  CLINICAL IMPRESSION: Pt. Works really hard during tx. Session and challenged with addition of functional reaching tasks  today.  Pt. Requires 1 UE assist on //-bars during functional reaching for balance/ safety. Pt. Ambulates around PT clinic with min. A and use of RW or //-bars.  Significant forward head/ rounded shoulder/ flexed posture during standing and gait.  Pt. Able to correct upright posture with verbal cuing/ mirror feedback. Pt. Requires heavy UE assist/ use of L LE to stand from chair and correct posture.  No LOB during tx. Session. Good LE muscle endurance with use of Nustep at L3 resistance today.  Pt. Will benefit from skilled PT services to improve standing tolerance/ gait independence and ADL.    OBJECTIVE IMPAIRMENTS Abnormal gait, decreased activity tolerance, decreased balance, decreased endurance, decreased mobility, difficulty walking, decreased ROM, decreased strength, decreased safety awareness, impaired flexibility, improper body mechanics, prosthetic dependency , and  pain.   ACTIVITY LIMITATIONS carrying, lifting, standing, squatting, stairs, transfers, bed mobility, bathing, toileting, dressing, hygiene/grooming, and locomotion level  PARTICIPATION LIMITATIONS: cleaning, laundry, driving, shopping, community activity, and yard work  [place] Past/current experiences are also affecting patient's functional outcome.   REHAB POTENTIAL: Good  CLINICAL DECISION MAKING: Evolving/moderate complexity  EVALUATION COMPLEXITY: High   GOALS: Goals reviewed with patient? Yes  SHORT TERM GOALS: Target date: 10/07/21 Pt. Able to don/doff R prosthetic leg independently to improve ADL/standing tolerance.  Baseline:  min. A to don/ doff prosthetic leg Goal status: Goal met   LONG TERM GOALS: Target date: 12/02/21  Pt. Will increase FOTO to 54 to improve functional mobility.  Baseline: initial FOTO 46 Goal status: INITIAL  2.  Pt. Able to tolerate standing 10 minutes while pulling up pants with mod. I safely while wearing prosthetic leg to improve daily activities.  Baseline:  limited standing tolerance Goal status: INITIAL  3.  Pt. Will ambulate 200 feet with use of SPC and mod. I to promote safety with household tasks/ walking into bathroom.   Baseline:  amb. With RW and min. A for safety.   Goal status: INITIAL   PLAN: PT FREQUENCY: 2x/week  PT DURATION: 12 weeks  PLANNED INTERVENTIONS: Therapeutic exercises, Therapeutic activity, Neuromuscular re-education, Balance training, Gait training, Patient/Family education, Self Care, Joint mobilization, Prosthetic training, DME instructions, and Manual therapy  PLAN FOR NEXT SESSION:  Issue posture ex. For HEP  Pura Spice, PT, DPT # [id number] 10/09/2021, 5:43 PM

## 2021-10-12 ENCOUNTER — Encounter: Payer: Self-pay | Admitting: Physical Therapy

## 2021-10-12 ENCOUNTER — Ambulatory Visit: Payer: Medicare Other | Admitting: Physical Therapy

## 2021-10-12 DIAGNOSIS — R293 Abnormal posture: Secondary | ICD-10-CM

## 2021-10-12 DIAGNOSIS — R269 Unspecified abnormalities of gait and mobility: Secondary | ICD-10-CM

## 2021-10-12 DIAGNOSIS — Z89611 Acquired absence of right leg above knee: Secondary | ICD-10-CM | POA: Diagnosis not present

## 2021-10-12 DIAGNOSIS — M6281 Muscle weakness (generalized): Secondary | ICD-10-CM

## 2021-10-12 NOTE — Therapy (Cosign Needed)
OUTPATIENT PHYSICAL THERAPY LOWER EXTREMITY TREATMENT   Patient Name: Jasmine Morrow MRN: [id number] DOB:[birth year], 76 y.o., female Today's Date: 10/12/2021   PT End of Session - 10/12/21 1032     Visit Number 7    Number of Visits 24    Date for PT Re-Evaluation 12/02/21    PT Start Time 1027    PT Stop Time 1115    PT Time Calculation (min) 48 min    Activity Tolerance Patient tolerated treatment well             Past Medical History:  Diagnosis Date   Anemia    Anxiety    Asthma    seasonal    GERD (gastroesophageal reflux disease)    Hypertension    PONV (postoperative nausea and vomiting)    Seasonal allergies    Sleep apnea    does not uses CPAP machine anymore    Past Surgical History:  Procedure Laterality Date   APPENDECTOMY     CESAREAN SECTION     CHOLECYSTECTOMY     DILATION AND CURETTAGE OF UTERUS     ESOPHAGOGASTRODUODENOSCOPY N/A 02/22/2019   Procedure: ESOPHAGOGASTRODUODENOSCOPY (EGD);  Surgeon: Toledo, Benay Pike, MD;  Location: [facility];  Service: Gastroenterology;  Laterality: N/A;   FLEXIBLE SIGMOIDOSCOPY N/A 02/22/2019   Procedure: FLEXIBLE SIGMOIDOSCOPY;  Surgeon: Toledo, Benay Pike, MD;  Location: [facility];  Service: Gastroenterology;  Laterality: N/A;   HARDWARE REMOVAL Right 11/12/2019   Procedure: Right ankle hardware removal;  Surgeon: Hessie Knows, MD;  Location: [facility];  Service: Orthopedics;  Laterality: Right;   HARDWARE REMOVAL Right 04/17/2020   Procedure: HARDWARE REMOVAL FROM RIGHT ANKLE AND RIGHT KNEE; IMPLANT OF CEMENT SPACERS IN RIGHT KNEE;  Surgeon: Dereck Leep, MD;  Location: [facility];  Service: Orthopedics;  Laterality: Right;   JOINT REPLACEMENT     ORIF ANKLE FRACTURE Right 03/14/2019   Procedure: OPEN REDUCTION INTERNAL FIXATION (ORIF) ANKLE FRACTURE, MEDIAL MALLEOLUS;  Surgeon: Hessie Knows, MD;  Location: [facility];  Service: Orthopedics;  Laterality: Right;   ORIF ANKLE FRACTURE Right 02/08/2019    Procedure: OPEN REDUCTION INTERNAL FIXATION (ORIF) ANKLE FRACTURE, post malleolus;  Surgeon: Hessie Knows, MD;  Location: [facility];  Service: Orthopedics;  Laterality: Right;   REPLACEMENT TOTAL KNEE Bilateral 2008   2009   SCAR DEBRIDEMENT OF TOTAL KNEE  01/2020   SYNDESMOSIS REPAIR Right 02/08/2019   Procedure: SYNDESMOSIS REPAIR;  Surgeon: Hessie Knows, MD;  Location: [facility];  Service: Orthopedics;  Laterality: Right;   TEE WITHOUT CARDIOVERSION N/A 02/26/2019   Procedure: TRANSESOPHAGEAL ECHOCARDIOGRAM (TEE);  Surgeon: Corey Skains, MD;  Location: [facility];  Service: Cardiovascular;  Laterality: N/A;   TOTAL KNEE REVISION WITH SCAR DEBRIDEMENT/PATELLA REVISION WITH POLY EXCHANGE Right 01/24/2020   Procedure: TOTAL KNEE REVISION WITH SCAR DEBRIDEMENT/PATELLA REVISION WITH POLY EXCHANGE;  Surgeon: Dereck Leep, MD;  Location: [facility];  Service: Orthopedics;  Laterality: Right;   TUMOR REMOVAL     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   Patient Active Problem List   Diagnosis Date Noted   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[place]) 04/17/2020   Anxiety 04/17/2020   History of GI diverticular bleed 04/17/2020   Chest pain 04/17/2020   PAD (peripheral artery disease) ([place]) 04/05/2020   Ulcer of right leg ([place]) 04/05/2020   COPD (chronic obstructive pulmonary disease) ([place]) 04/05/2020   GERD (gastroesophageal reflux disease) 04/05/2020   Abscess of right knee 01/22/2020   Pressure injury of skin 02/28/2019  SOB (shortness of breath)    Gastrointestinal hemorrhage    Sepsis ([place])    Symptomatic anemia 02/20/2019   Trimalleolar fracture of ankle, closed, right, initial encounter 02/07/2019   Multiple lung nodules 03/31/2014   Extrinsic asthma 03/27/2014   OSA on CPAP 03/27/2014    PCP: Rusty Aus, MD  REFERRING PROVIDER: Rusty Aus, MD  REFERRING DIAG: Above knee amputation of right lower extremity  THERAPY DIAG:  S/P AKA (above knee amputation) unilateral, right  (HCC)  Muscle weakness (generalized)  Gait difficulty  Abnormal posture  Rationale for Evaluation and Treatment Rehabilitation  ONSET DATE: 12/02/20  SUBJECTIVE:    PERTINENT HISTORY: Patient Profile:   Jasmine Morrow is a 76 y.o. female Chief Complaint  Patient presents with  Visit Follow Up  Doing well.   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 Cardiac cath 10/2011 showed completely normal coronary arteries, however she did have mild left ventricular dysfunction with anterior wall motion abnormality. Left ventricular EF was 51%.  History of chickenpox  Hyperlipidemia  patient states she has never been diagnosed with this  Hypertension  Knee joint replacement by other means  Macular degeneration  Major depressive disorder, recurrent, mild (CMS-HCC)  Medicare annual wellness visit, initial 08/09/2019  7/21  Multiple thyroid nodules  Normal coronary arteries 09/06/2013  Normal coronary anatomy by cardiac catheterization 10/12/11  Osteoarthritis  Ovarian cyst  PONV (postoperative nausea and vomiting)  nausea  Postoperative urinary retention 12/08/2020  Rash on lips 12/21/2020  Seasonal allergies  Sinusitis, unspecified  Sleep apnea  On CPAP.  Slow transit constipation 12/10/2020  Trimalleolar fracture of ankle, closed, right, initial encounter 59/93/5701  Complicated by staph  infection 1/21, 4 weeks IV Ancef, [place]  Ulcer   Past Surgical History:  Procedure Laterality Date  SALPINGO OOPHORECTOMY Bilateral 1993  Right total knee arthroplasty 10/11/2006  Dr Marry Guan  Left total knee arthroplasty 10/12/2007  Dr Marry Guan  COLONOSCOPY 07/30/2012  internal hemorrhoids, diverticulosis  ANKLE ARTHODESIS W/ ARTHROSCOPY Right 02/08/2019  03/14/2019 second surgery  COLONOSCOPY 02/22/2019  Blood in entire colon/Diverticulosis - Presumed diverticular bleed. No repeat recommended per TKT.  EGD 02/22/2019  Gastritis/gastric ulcers/Hiatal hernia/Otherwise normal - no repeat recommended per TKT.  ORIF ANKLE FRACTURE Right 03/14/2019  Dr. Rudene Christians  REMOVAL HARDWARE ANKLE FOOT/TOES Right 11/12/2019  Dr. Rudene Christians  Right knee arthrotomy, irrigation and debridement of the right knee, polyethylene exchange 01/24/2020  Dr Marry Guan  Removal of hardware (plates and screws) from the right ankle with irrigation, debridement, and placement of Stimulan antibiotic beads 04/17/2020  Dr Marry Guan  Right knee arthrotomy, extensive irrigation debridement, removal of right total knee implants, and placement of antibiotic impregnated polymethylmethacrylate cement spacer 04/17/2020  Dr Marry Guan  AMPUTATION LEG ABOVE KNEE AKA Right 12/02/2020  Procedure: AMPUTATION, THIGH, THROUGH FEMUR, ANY LEVEL; Surgeon: Lyndle Herrlich, MD; Location: [place]; Service: Orthopedics; Laterality: Right;  TRANSFER ADJACENT TISSUE LEG Right 12/02/2020  Procedure: ADJACENT TISSUE TRANSFER OR REARRANGEMENT, LEG;  DEFECT 10 SQ CM OR LESS; Surgeon: Lyndle Herrlich, MD; Location: [place]; Service: Orthopedics; Laterality: Right;  ASPIRATION/INJECTION MAJOR JOINT/BURSA KNEE Left 12/02/2020  Procedure: ARTHROCENTESIS, ASPIRATION AND/OR INJECTION, MAJOR JOINT OR BURSA, KNEE; WITHOUT ULTRASOUND GUIDANCE; Surgeon: Lyndle Herrlich, MD; Location: [place]; Service: Orthopedics; Laterality: Left;  REMOVAL KNEE  PROSTHESIS W/POSSIBLE PLACEMENT SPACER Left 01/18/2021  Procedure: REMOVAL OF PROSTHESIS, INCLUDING TOTAL KNEE PROSTHESIS, METHYLMETHACRYLATE WITH OR WITHOUT INSERTION OF SPACER, KNEE; Surgeon: Ihor Dow, MD; Location: [place]; Service: Orthopedics; Laterality: Left;  INSERTION NON-BIODEGRADABLE DRUG DELIVERY IMPLANT Left 01/18/2021  Procedure: INSERTION, KNEE, bioresorbable, biodegradable, NON-BIODEGRADABLE DRUG DELIVERY IMPLANT; Surgeon: Ihor Dow, MD; Location: [place]; Service: Orthopedics; Laterality: Left;  ABOVE KNEE LEG AMPUTATION  12/02/2020  APPENDECTOMY Est 1968  CESAREAN SECTION  CHOLECYSTECTOMY  FRACTURE SURGERY 1/21  HYSTERECTOMY partial  JOINT REPLACEMENT 2008 & 2009  TUBAL LIGATION 3/79   ALLERGIES: Allergies  Allergen Reactions  Singulair [Montelukast] Other (See Comments)  Gland swelling  Sulfa (Sulfonamide Antibiotics) Swelling  Oxycodone Dizziness and Other (See Comments)  Vancomycin Analogues Other (See Comments)  Ringing in ears  Avinza [Morphine] Itching  Darvocet A500 [Propoxyphene N-Acetaminophen] Nausea  Nickel Rash  Ultracet [Tramadol-Acetaminophen] Rash  Vicodin [Hydrocodone-Acetaminophen] Vomiting  Vioxx [Rofecoxib] Other (See Comments)  GI   CURRENT MEDICATIONS: Current Outpatient Medications: acetaminophen (TYLENOL) 500 MG tablet, Take 1,000 mg by mouth 3 (three) times a day Twice daily per patient., PRN Not Currently Taking aspirin 81 MG EC tablet, Take 1 tablet (81 mg total) by mouth at bedtime, Taking calcium carbonate-vitamin D3 (CALTRATE 600+D) 600 mg-10 mcg (400 unit) tablet, Take 0.5 tablets by mouth 2 (two) times daily, Taking cetirizine (ZYRTEC) 10 mg capsule, Take 1 capsule (10 mg total) by mouth once daily, Taking escitalopram oxalate (LEXAPRO) 10 MG tablet, Take 1 tablet (10 mg total) by mouth once daily, Taking estradioL (ESTRACE) 0.01 % (0.1 mg/gram) vaginal cream, Place 2 g vaginally twice a  week, Taking folic acid/multivit,iron,miner (MULTIVIT-IRON-MIN-FOLIC ACID ORAL), Take 1 tablet by mouth once daily, Taking gabapentin (NEURONTIN) 300 MG capsule, Take 1 capsule (300 mg total) by mouth 3 (three) times daily Phantom limb pain, Taking melatonin 3 mg tablet, Take 1 tablet (3 mg total) by mouth at bedtime, Taking multivitamin with iron (COMPLETE MULTIVITAMIN-MINERAL) tablet, Take 1 tablet by mouth, Taking multivitamin with minerals, EYE, (PRESERVISION AREDS 2) soft gel capsule, Take 1 capsule by mouth 2 (two) times daily, Taking nystatin (MYCOSTATIN) 100,000 unit/gram powder, Apply small amount topically twice daily for rash under breasts, PRN Not Currently Taking pantoprazole (PROTONIX) 40 MG DR tablet, Take 1 tablet (40 mg total) by mouth 2 (two) times daily. Please call to schedule an appt. Thanks!, Taking polyethylene glycol (MIRALAX) powder, Take 17 g by mouth once daily Mix in 4-8ounces of fluid prior to taking., Taking sennosides-docusate (SENOKOT-S) 8.6-50 mg tablet, Take 1 tablet by mouth 2 (two) times daily For constipation, Taking lactose-reduced food (ENSURE PLUS ORAL), Take 237 mLs by mouth 2 (two) times daily With breakfast and dinner  HPI   CLINICAL SUMMARY:  Patient post right AKA and working with prosthesis. 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 Able to do housework, improving  PAIN:  Are you having pain? Yes: NPRS scale: 5/10 Pain location: L knee Pain description: aching Aggravating factors: walking Relieving factors: rest  PRECAUTIONS: None  WEIGHT BEARING RESTRICTIONS No  FALLS:  Has patient fallen in last 6 months? No  LIVING ENVIRONMENT:  Lives with: lives with their spouse Lives in: House/apartment Stairs: No Has following equipment at home: Gilford Rile - 4 wheeled and Wheelchair (manual)  OCCUPATION: retired  PLOF: Requires assistive device for independence  PATIENT GOALS  improve standing tolerance/ pull up  pants/ ambulate with [place] safely.     OBJECTIVE:   PATIENT SURVEYS:  FOTO initial 46/ goal 47.    COGNITION:  Overall cognitive status: Within functional limits for tasks assessed     SENSATION: WFL  POSTURE: rounded shoulders, forward head, flexed trunk , and weight shift left  PALPATION: No tenderness along R distal residual limb.  Discussed phantom limb sensation.    LOWER EXTREMITY ROM:  B LE AROM WFL except L knee extension secondary to articulating spacer.  Did not assess L/R hip extension at this time.    LOWER EXTREMITY MMT:  MMT Right eval Left eval  Hip flexion 4/5 4/5  Hip extension    Hip abduction 4+/5 4+/5  Hip adduction 4+/5 4+/5  Hip internal rotation    Hip external rotation    Knee flexion N/A 4+/5  Knee extension N/A 4+/5  Ankle dorsiflexion  5/5  Ankle plantarflexion    Ankle inversion    Ankle eversion     (Blank rows = not tested)  GAIT: Distance walked: in gym/ //-bars Assistive device utilized: Environmental consultant - 2 wheeled Level of assistance: CGA Comments: Cuing for posture correction with mirror feedback.  Flexed posture/ heavy UE assist.     TODAY'S TREATMENT:  10/12/21  Subjective:  Pt. Reports no pain or new complaints.  Pt. Is not wearing prosthetic leg a lot during the day.  Pt. Is unable to walk around house because husband is unable to assist her walking (fear of not being able to assist if any LOB).       Objective:  Gait training:  Ambulate in clinic with RW/ min. A and moderate verbal cuing to correct upright posture.  50 feet x 3 (blue mat table to door).  Added cones for maneuvering RW/ foot placement x 2.    Amb. From blue mat table to stairs/Nustep and from Nustep to passenger side of car in front of building with RW and min. A for safety.  Pt. has good control of R knee mechanism/ R heel strike.  No LOB but occasional cuing to correct posture/ head position.  Pt. Able to step over door thresholds and get into passenger side of  car safely.      Ascend/ descend stairs with step to gait pattern and B UE assist on stairs (2  Reps./ good technique with stepping down and turning at top of stairs.     Neuro.mm.:    Standing from blue mat table with upright posture 5x.  Standing wt. Shifting with no UE assist in front of blue mat table (CGA/min. A for safety/ verbal cuing).  Posture correction in seated/ standing position.    There.ex.:    Nustep L3 10 min. B UE/LE.  Consistent cadence for muscle endurance.    Seated marching/ hip abduction on blue mat table.  Discussed HEP/ use of prosthetic leg and walking at home.     PATIENT EDUCATION:  Education details: HEP/ gait and prosthetic training.  Person educated: Patient and Spouse Education method: Explanation, Demonstration, and Verbal cues Education comprehension: verbalized understanding, returned demonstration, and tactile cues required   HOME EXERCISE PROGRAM: Prone position hip stretches.    ASSESSMENT:  CLINICAL IMPRESSION: Pt. Ambulates >100 feet in clinic  with use of prosthetic leg/ RW with consistent recip. Pattern and CGA/min. A for cuing/ safety.  Good R knee mechanism control during gait/ standing wt. Shifting in clinic.  Pt. Able to correct upright posture with verbal cuing/ mirror feedback. Pt. Requires heavy UE assist/ use of L LE to stand from chair and correct posture.  Ascends/ descends stairs with step to gait pattern and heavy B UE assist on handrails. Good LE muscle endurance with use of Nustep at L3 resistance today.  Pt. Will benefit from skilled PT services to improve standing tolerance/ gait independence and ADL.    OBJECTIVE IMPAIRMENTS Abnormal gait, decreased activity tolerance, decreased balance, decreased endurance, decreased mobility, difficulty walking, decreased ROM, decreased strength, decreased safety awareness, impaired flexibility, improper body mechanics, prosthetic dependency , and pain.   ACTIVITY LIMITATIONS carrying,  lifting, standing, squatting, stairs, transfers, bed mobility, bathing, toileting, dressing, hygiene/grooming, and locomotion level  PARTICIPATION LIMITATIONS: cleaning, laundry, driving, shopping, community activity, and yard work  [place] Past/current experiences are also affecting patient's functional outcome.   REHAB POTENTIAL: Good  CLINICAL DECISION MAKING: Evolving/moderate complexity  EVALUATION COMPLEXITY: High   GOALS: Goals reviewed with patient? Yes  SHORT TERM GOALS: Target date: 10/07/21 Pt. Able to don/doff R prosthetic leg independently to improve ADL/standing tolerance.  Baseline:  min. A to don/ doff prosthetic leg Goal status: Goal met   LONG TERM GOALS: Target date: 12/02/21  Pt. Will increase FOTO to 54 to improve functional mobility.  Baseline: initial FOTO 46 Goal status: INITIAL  2.  Pt. Able to tolerate standing 10 minutes while pulling up pants with mod. I safely while wearing prosthetic leg to improve daily activities.  Baseline:  limited standing tolerance Goal status: INITIAL  3.  Pt. Will ambulate 200 feet with use of SPC and mod. I to promote safety with household tasks/ walking into bathroom.   Baseline:  amb. With RW and min. A for safety.   Goal status: INITIAL   PLAN: PT FREQUENCY: 2x/week  PT DURATION: 12 weeks  PLANNED INTERVENTIONS: Therapeutic exercises, Therapeutic activity, Neuromuscular re-education, Balance training, Gait training, Patient/Family education, Self Care, Joint mobilization, Prosthetic training, DME instructions, and Manual therapy  PLAN FOR NEXT SESSION:  Issue posture ex. For HEP  Pura Spice, PT, DPT # [phone] 10/12/2021, 1:38 PM

## 2021-10-13 ENCOUNTER — Encounter: Payer: Medicare Other | Admitting: Physical Therapy

## 2021-10-14 ENCOUNTER — Ambulatory Visit: Payer: Medicare Other | Admitting: Physical Therapy

## 2021-10-14 ENCOUNTER — Encounter: Payer: Medicare Other | Admitting: Physical Therapy

## 2021-10-14 DIAGNOSIS — R269 Unspecified abnormalities of gait and mobility: Secondary | ICD-10-CM

## 2021-10-14 DIAGNOSIS — M6281 Muscle weakness (generalized): Secondary | ICD-10-CM

## 2021-10-14 DIAGNOSIS — R293 Abnormal posture: Secondary | ICD-10-CM

## 2021-10-14 DIAGNOSIS — Z89611 Acquired absence of right leg above knee: Secondary | ICD-10-CM

## 2021-10-17 NOTE — Therapy (Signed)
OUTPATIENT PHYSICAL THERAPY LOWER EXTREMITY TREATMENT   Patient Name: Jasmine Morrow MRN: [id number] DOB:1945/11/09, 76 y.o., female Today's Date: 10/14/2021   PT End of Session - 10/17/21 1804     Visit Number 8    Number of Visits 24    Date for PT Re-Evaluation 12/02/21    PT Start Time 1722    PT Stop Time 1818    PT Time Calculation (min) 56 min    Activity Tolerance Patient tolerated treatment well             Past Medical History:  Diagnosis Date   Anemia    Anxiety    Asthma    seasonal    GERD (gastroesophageal reflux disease)    Hypertension    PONV (postoperative nausea and vomiting)    Seasonal allergies    Sleep apnea    does not uses CPAP machine anymore    Past Surgical History:  Procedure Laterality Date   APPENDECTOMY     CESAREAN SECTION     CHOLECYSTECTOMY     DILATION AND CURETTAGE OF UTERUS     ESOPHAGOGASTRODUODENOSCOPY N/A 02/22/2019   Procedure: ESOPHAGOGASTRODUODENOSCOPY (EGD);  Surgeon: Toledo, Benay Pike, MD;  Location: [facility];  Service: Gastroenterology;  Laterality: N/A;   FLEXIBLE SIGMOIDOSCOPY N/A 02/22/2019   Procedure: FLEXIBLE SIGMOIDOSCOPY;  Surgeon: Toledo, Benay Pike, MD;  Location: [facility];  Service: Gastroenterology;  Laterality: N/A;   HARDWARE REMOVAL Right 11/12/2019   Procedure: Right ankle hardware removal;  Surgeon: Hessie Knows, MD;  Location: [facility];  Service: Orthopedics;  Laterality: Right;   HARDWARE REMOVAL Right 04/17/2020   Procedure: HARDWARE REMOVAL FROM RIGHT ANKLE AND RIGHT KNEE; IMPLANT OF CEMENT SPACERS IN RIGHT KNEE;  Surgeon: Dereck Leep, MD;  Location: [facility];  Service: Orthopedics;  Laterality: Right;   JOINT REPLACEMENT     ORIF ANKLE FRACTURE Right 03/14/2019   Procedure: OPEN REDUCTION INTERNAL FIXATION (ORIF) ANKLE FRACTURE, MEDIAL MALLEOLUS;  Surgeon: Hessie Knows, MD;  Location: [facility];  Service: Orthopedics;  Laterality: Right;   ORIF ANKLE FRACTURE Right 02/08/2019    Procedure: OPEN REDUCTION INTERNAL FIXATION (ORIF) ANKLE FRACTURE, post malleolus;  Surgeon: Hessie Knows, MD;  Location: [facility];  Service: Orthopedics;  Laterality: Right;   REPLACEMENT TOTAL KNEE Bilateral 2008   2009   SCAR DEBRIDEMENT OF TOTAL KNEE  01/2020   SYNDESMOSIS REPAIR Right 02/08/2019   Procedure: SYNDESMOSIS REPAIR;  Surgeon: Hessie Knows, MD;  Location: [facility];  Service: Orthopedics;  Laterality: Right;   TEE WITHOUT CARDIOVERSION N/A 02/26/2019   Procedure: TRANSESOPHAGEAL ECHOCARDIOGRAM (TEE);  Surgeon: Corey Skains, MD;  Location: [facility];  Service: Cardiovascular;  Laterality: N/A;   TOTAL KNEE REVISION WITH SCAR DEBRIDEMENT/PATELLA REVISION WITH POLY EXCHANGE Right 01/24/2020   Procedure: TOTAL KNEE REVISION WITH SCAR DEBRIDEMENT/PATELLA REVISION WITH POLY EXCHANGE;  Surgeon: Dereck Leep, MD;  Location: [facility];  Service: Orthopedics;  Laterality: Right;   TUMOR REMOVAL     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   Patient Active Problem List   Diagnosis Date Noted   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[place]) 04/17/2020   Anxiety 04/17/2020   History of GI diverticular bleed 04/17/2020   Chest pain 04/17/2020   PAD (peripheral artery disease) ([place]) 04/05/2020   Ulcer of right leg ([place]) 04/05/2020   COPD (chronic obstructive pulmonary disease) ([place]) 04/05/2020   GERD (gastroesophageal reflux disease) 04/05/2020   Abscess of right knee 01/22/2020   Pressure injury of skin 02/28/2019  SOB (shortness of breath)    Gastrointestinal hemorrhage    Sepsis (HCC)    Symptomatic anemia 02/20/2019   Trimalleolar fracture of ankle, closed, right, initial encounter 02/07/2019   Multiple lung nodules 03/31/2014   Extrinsic asthma 03/27/2014   OSA on CPAP 03/27/2014    PCP: Miller, Mark F, MD  REFERRING PROVIDER: Miller, Mark F, MD  REFERRING DIAG: Above knee amputation of right lower extremity  THERAPY DIAG:  S/P AKA (above knee amputation) unilateral, right  (HCC)  Muscle weakness (generalized)  Gait difficulty  Abnormal posture  Rationale for Evaluation and Treatment Rehabilitation  ONSET DATE: 12/02/20  SUBJECTIVE:  EVALUATION  PERTINENT HISTORY: Patient Profile:   Jasmine Morrow is a 76 y.o. female Chief Complaint  Patient presents with  Visit Follow Up  Doing well.   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 Cardiac cath 10/2011 showed completely normal coronary arteries, however she did have mild left ventricular dysfunction with anterior wall motion abnormality. Left ventricular EF was 51%.  History of chickenpox  Hyperlipidemia  patient states she has never been diagnosed with this  Hypertension  Knee joint replacement by other means  Macular degeneration  Major depressive disorder, recurrent, mild (CMS-HCC)  Medicare annual wellness visit, initial 08/09/2019  7/21  Multiple thyroid nodules  Normal coronary arteries 09/06/2013  Normal coronary anatomy by cardiac catheterization 10/12/11  Osteoarthritis  Ovarian cyst  PONV (postoperative nausea and vomiting)  nausea  Postoperative urinary retention 12/08/2020  Rash on lips 12/21/2020  Seasonal allergies  Sinusitis, unspecified  Sleep apnea  On CPAP.  Slow transit constipation 12/10/2020  Trimalleolar fracture of ankle, closed, right, initial encounter 02/07/2019  Complicated by  staph infection 1/21, 4 weeks IV Ancef, Menz  Ulcer   Past Surgical History:  Procedure Laterality Date  SALPINGO OOPHORECTOMY Bilateral 1993  Right total knee arthroplasty 10/11/2006  Dr Hooten  Left total knee arthroplasty 10/12/2007  Dr Hooten  COLONOSCOPY 07/30/2012  internal hemorrhoids, diverticulosis  ANKLE ARTHODESIS W/ ARTHROSCOPY Right 02/08/2019  03/14/2019 second surgery  COLONOSCOPY 02/22/2019  Blood in entire colon/Diverticulosis - Presumed diverticular bleed. No repeat recommended per TKT.  EGD 02/22/2019  Gastritis/gastric ulcers/Hiatal hernia/Otherwise normal - no repeat recommended per TKT.  ORIF ANKLE FRACTURE Right 03/14/2019  Dr. Menz  REMOVAL HARDWARE ANKLE FOOT/TOES Right 11/12/2019  Dr. Menz  Right knee arthrotomy, irrigation and debridement of the right knee, polyethylene exchange 01/24/2020  Dr Hooten  Removal of hardware (plates and screws) from the right ankle with irrigation, debridement, and placement of Stimulan antibiotic beads 04/17/2020  Dr Hooten  Right knee arthrotomy, extensive irrigation debridement, removal of right total knee implants, and placement of antibiotic impregnated polymethylmethacrylate cement spacer 04/17/2020  Dr Hooten  AMPUTATION LEG ABOVE KNEE AKA Right 12/02/2020  Procedure: AMPUTATION, THIGH, THROUGH FEMUR, ANY LEVEL; Surgeon: Chun, Danielle Sunae, MD; Location: [facility]; Service: Orthopedics; Laterality: Right;  TRANSFER ADJACENT TISSUE LEG Right 12/02/2020  Procedure: ADJACENT TISSUE TRANSFER OR REARRANGEMENT, LEG;   DEFECT 10 SQ CM OR LESS; Surgeon: Chun, Danielle Sunae, MD; Location: [facility]; Service: Orthopedics; Laterality: Right;  ASPIRATION/INJECTION MAJOR JOINT/BURSA KNEE Left 12/02/2020  Procedure: ARTHROCENTESIS, ASPIRATION AND/OR INJECTION, MAJOR JOINT OR BURSA, KNEE; WITHOUT ULTRASOUND GUIDANCE; Surgeon: Chun, Danielle Sunae, MD; Location: [facility]; Service: Orthopedics; Laterality: Left;  REMOVAL  KNEE PROSTHESIS W/POSSIBLE PLACEMENT SPACER Left 01/18/2021  Procedure: REMOVAL OF PROSTHESIS, INCLUDING TOTAL KNEE PROSTHESIS, METHYLMETHACRYLATE WITH OR WITHOUT INSERTION OF SPACER, KNEE; Surgeon: Jiranek, William Arthur, MD; Location: [facility]; Service: Orthopedics; Laterality: Left;  INSERTION NON-BIODEGRADABLE DRUG DELIVERY IMPLANT Left 01/18/2021  Procedure: INSERTION, KNEE, bioresorbable, biodegradable, NON-BIODEGRADABLE DRUG DELIVERY IMPLANT; Surgeon: Jiranek, William Arthur, MD; Location: [facility]; Service: Orthopedics; Laterality: Left;  ABOVE KNEE LEG AMPUTATION  12/02/2020  APPENDECTOMY Est 1968  CESAREAN SECTION  CHOLECYSTECTOMY  FRACTURE SURGERY 1/21  HYSTERECTOMY partial  JOINT REPLACEMENT 2008 & 2009  TUBAL LIGATION 3/79   ALLERGIES: Allergies  Allergen Reactions  Singulair [Montelukast] Other (See Comments)  Gland swelling  Sulfa (Sulfonamide Antibiotics) Swelling  Oxycodone Dizziness and Other (See Comments)  Vancomycin Analogues Other (See Comments)  Ringing in ears  Avinza [Morphine] Itching  Darvocet A500 [Propoxyphene N-Acetaminophen] Nausea  Nickel Rash  Ultracet [Tramadol-Acetaminophen] Rash  Vicodin [Hydrocodone-Acetaminophen] Vomiting  Vioxx [Rofecoxib] Other (See Comments)  GI   CURRENT MEDICATIONS: Current Outpatient Medications: acetaminophen (TYLENOL) 500 MG tablet, Take 1,000 mg by mouth 3 (three) times a day Twice daily per patient., PRN Not Currently Taking aspirin 81 MG EC tablet, Take 1 tablet (81 mg total) by mouth at bedtime, Taking calcium carbonate-vitamin D3 (CALTRATE 600+D) 600 mg-10 mcg (400 unit) tablet, Take 0.5 tablets by mouth 2 (two) times daily, Taking cetirizine (ZYRTEC) 10 mg capsule, Take 1 capsule (10 mg total) by mouth once daily, Taking escitalopram oxalate (LEXAPRO) 10 MG tablet, Take 1 tablet (10 mg total) by mouth once daily, Taking estradioL (ESTRACE) 0.01 % (0.1 mg/gram) vaginal cream, Place 2 g vaginally twice a  week, Taking folic acid/multivit,iron,miner (MULTIVIT-IRON-MIN-FOLIC ACID ORAL), Take 1 tablet by mouth once daily, Taking gabapentin (NEURONTIN) 300 MG capsule, Take 1 capsule (300 mg total) by mouth 3 (three) times daily Phantom limb pain, Taking melatonin 3 mg tablet, Take 1 tablet (3 mg total) by mouth at bedtime, Taking multivitamin with iron (COMPLETE MULTIVITAMIN-MINERAL) tablet, Take 1 tablet by mouth, Taking multivitamin with minerals, EYE, (PRESERVISION AREDS 2) soft gel capsule, Take 1 capsule by mouth 2 (two) times daily, Taking nystatin (MYCOSTATIN) 100,000 unit/gram powder, Apply small amount topically twice daily for rash under breasts, PRN Not Currently Taking pantoprazole (PROTONIX) 40 MG DR tablet, Take 1 tablet (40 mg total) by mouth 2 (two) times daily. Please call to schedule an appt. Thanks!, Taking polyethylene glycol (MIRALAX) powder, Take 17 g by mouth once daily Mix in 4-8ounces of fluid prior to taking., Taking sennosides-docusate (SENOKOT-S) 8.6-50 mg tablet, Take 1 tablet by mouth 2 (two) times daily For constipation, Taking lactose-reduced food (ENSURE PLUS ORAL), Take 237 mLs by mouth 2 (two) times daily With breakfast and dinner  HPI   CLINICAL SUMMARY:  Patient post right AKA and working with prosthesis. 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 Able to do housework, improving  PAIN:  Are you having pain? Yes: NPRS scale: 5/10 Pain location: L knee Pain description: aching Aggravating factors: walking Relieving factors: rest  PRECAUTIONS: None  WEIGHT BEARING RESTRICTIONS No  FALLS:  Has patient fallen in last 6 months? No  LIVING ENVIRONMENT:   Lives with: lives with their spouse Lives in: House/apartment Stairs: No Has following equipment at home: Gilford Rile - 4 wheeled and Wheelchair (manual)  OCCUPATION: retired  PLOF: Requires assistive device for independence  PATIENT GOALS  improve standing tolerance/ pull up  pants/ ambulate with [place] safely.     OBJECTIVE:   PATIENT SURVEYS:  FOTO initial 46/ goal 67.    COGNITION:  Overall cognitive status: Within functional limits for tasks assessed     SENSATION: WFL  POSTURE: rounded shoulders, forward head, flexed trunk , and weight shift left  PALPATION: No tenderness along R distal residual limb.  Discussed phantom limb sensation.    LOWER EXTREMITY ROM:  B LE AROM WFL except L knee extension secondary to articulating spacer.  Did not assess L/R hip extension at this time.    LOWER EXTREMITY MMT:  MMT Right eval Left eval  Hip flexion 4/5 4/5  Hip extension    Hip abduction 4+/5 4+/5  Hip adduction 4+/5 4+/5  Hip internal rotation    Hip external rotation    Knee flexion N/A 4+/5  Knee extension N/A 4+/5  Ankle dorsiflexion  5/5  Ankle plantarflexion    Ankle inversion    Ankle eversion     (Blank rows = not tested)  GAIT: Distance walked: in gym/ //-bars Assistive device utilized: Environmental consultant - 2 wheeled Level of assistance: CGA Comments: Cuing for posture correction with mirror feedback.  Flexed posture/ heavy UE assist.     TODAY'S TREATMENT:  10/14/21  Subjective:  Pt. Reports no pain or new complaints.  Pt. Donned prosthetic leg in car and pts. Husband assisted pt. With use of RW into PT clinic.  Pts. Husband is having a difficult time lifting w/c into/ out of car due to back issues.  Pts. Husband has a bandage on hand after cutting hand maneuvering w.c into trunk of car.     Objective:  Gait training:  Ambulate in clinic with RW/ min. A and moderate verbal cuing to correct upright posture.  78 feet/ 55 feet around gym.     Amb. From blue mat table to stairs/ Nustep and from Nustep to passenger side of car in front of building with RW and min. A for safety.  Pt. has good control of R knee mechanism/ R heel strike.  No LOB but occasional cuing to correct posture/ head position.  Pt. Able to step over door thresholds and get  into passenger side of car safely.      Ascend/ descend stairs with step to gait pattern and B UE assist on stairs (2  Reps./ good technique with stepping down and turning at top of stairs).     Neuro.mm.:    Standing from gray chair in front of //-bars/mirror with upright posture 5x.  Standing wt. Shifting with no UE assist in front of chair (CGA/min. A for safety/ verbal cuing).  Posture correction in seated/ standing position.        Lateral walking at //-bars with mirror feedback/ posture correction.  4 laps.   There.ex.:    Nustep L3 10 min. B UE/LE.  Consistent cadence for muscle endurance.    Seated marching/ hip abduction on blue mat table.  Discussed HEP/ use of prosthetic leg and walking at home.  Pt. Is interested in returning to driving.  PT discussed seeing an OT who specializes in returning to driving with adaptors.     PATIENT EDUCATION:  Education details: HEP/ gait and prosthetic training.  Person educated: Patient and Spouse Education method: Explanation, Demonstration, and Verbal cues Education comprehension: verbalized understanding, returned demonstration, and tactile cues required   HOME EXERCISE PROGRAM: Prone position hip stretches.    ASSESSMENT:  CLINICAL IMPRESSION: Pt. Ambulates into PT clinic with use of prosthetic leg/ RW with consistent recip. Pattern and SBA/CGA from husband for 1st time today.  Good R knee mechanism control during gait/ standing wt. Shifting in clinic.  Pt. Able to correct upright posture with verbal cuing/ mirror feedback. Pt. Requires heavy UE assist/ use of L LE to stand from chair and correct posture.  Ascends/ descends stairs with step to gait pattern and heavy B UE assist on handrails. Good LE muscle endurance with use of Nustep at L3 resistance today.  Pt. Will benefit from skilled PT services to improve standing tolerance/ gait independence and ADL.    OBJECTIVE IMPAIRMENTS Abnormal gait, decreased activity tolerance, decreased  balance, decreased endurance, decreased mobility, difficulty walking, decreased ROM, decreased strength, decreased safety awareness, impaired flexibility, improper body mechanics, prosthetic dependency , and pain.   ACTIVITY LIMITATIONS carrying, lifting, standing, squatting, stairs, transfers, bed mobility, bathing, toileting, dressing, hygiene/grooming, and locomotion level  PARTICIPATION LIMITATIONS: cleaning, laundry, driving, shopping, community activity, and yard work  [place] Past/current experiences are also affecting patient's functional outcome.   REHAB POTENTIAL: Good  CLINICAL DECISION MAKING: Evolving/moderate complexity  EVALUATION COMPLEXITY: High   GOALS: Goals reviewed with patient? Yes  SHORT TERM GOALS: Target date: 10/07/21 Pt. Able to don/doff R prosthetic leg independently to improve ADL/standing tolerance.  Baseline:  min. A to don/ doff prosthetic leg Goal status: Goal met   LONG TERM GOALS: Target date: 12/02/21  Pt. Will increase FOTO to 54 to improve functional mobility.  Baseline: initial FOTO 46 Goal status: INITIAL  2.  Pt. Able to tolerate standing 10 minutes while pulling up pants with mod. I safely while wearing prosthetic leg to improve daily activities.  Baseline:  limited standing tolerance Goal status: INITIAL  3.  Pt. Will ambulate 200 feet with use of SPC and mod. I to promote safety with household tasks/ walking into bathroom.   Baseline:  amb. With RW and min. A for safety.   Goal status: INITIAL   PLAN: PT FREQUENCY: 2x/week  PT DURATION: 12 weeks  PLANNED INTERVENTIONS: Therapeutic exercises, Therapeutic activity, Neuromuscular re-education, Balance training, Gait training, Patient/Family education, Self Care, Joint mobilization, Prosthetic training, DME instructions, and Manual therapy  PLAN FOR NEXT SESSION:  Issue posture ex. For HEP.  Give info for [employer] (OT for driving).    Pura Spice, PT, DPT #  [phone] 10/17/2021, 6:06 PM

## 2021-10-19 ENCOUNTER — Ambulatory Visit: Payer: Medicare Other | Admitting: Physical Therapy

## 2021-10-19 DIAGNOSIS — R269 Unspecified abnormalities of gait and mobility: Secondary | ICD-10-CM

## 2021-10-19 DIAGNOSIS — M6281 Muscle weakness (generalized): Secondary | ICD-10-CM

## 2021-10-19 DIAGNOSIS — R293 Abnormal posture: Secondary | ICD-10-CM

## 2021-10-19 DIAGNOSIS — Z89611 Acquired absence of right leg above knee: Secondary | ICD-10-CM

## 2021-10-21 ENCOUNTER — Ambulatory Visit: Payer: Medicare Other | Admitting: Physical Therapy

## 2021-10-21 ENCOUNTER — Encounter: Payer: Self-pay | Admitting: Physical Therapy

## 2021-10-21 DIAGNOSIS — M6281 Muscle weakness (generalized): Secondary | ICD-10-CM

## 2021-10-21 DIAGNOSIS — Z89611 Acquired absence of right leg above knee: Secondary | ICD-10-CM | POA: Diagnosis not present

## 2021-10-21 DIAGNOSIS — R269 Unspecified abnormalities of gait and mobility: Secondary | ICD-10-CM

## 2021-10-21 DIAGNOSIS — R293 Abnormal posture: Secondary | ICD-10-CM

## 2021-10-21 NOTE — Therapy (Signed)
OUTPATIENT PHYSICAL THERAPY LOWER EXTREMITY TREATMENT   Patient Name: Jasmine Morrow MRN: [id number] DOB:Sep 07, 1945, 76 y.o., female Today's Date: 10/19/2021   PT End of Session - 10/21/21 1110     Visit Number 9    Number of Visits 24    Date for PT Re-Evaluation 12/02/21    PT Start Time 1026    PT Stop Time 1117    PT Time Calculation (min) 51 min    Activity Tolerance Patient tolerated treatment well             Past Medical History:  Diagnosis Date   Anemia    Anxiety    Asthma    seasonal    GERD (gastroesophageal reflux disease)    Hypertension    PONV (postoperative nausea and vomiting)    Seasonal allergies    Sleep apnea    does not uses CPAP machine anymore    Past Surgical History:  Procedure Laterality Date   APPENDECTOMY     CESAREAN SECTION     CHOLECYSTECTOMY     DILATION AND CURETTAGE OF UTERUS     ESOPHAGOGASTRODUODENOSCOPY N/A 02/22/2019   Procedure: ESOPHAGOGASTRODUODENOSCOPY (EGD);  Surgeon: Toledo, Benay Pike, MD;  Location: [facility];  Service: Gastroenterology;  Laterality: N/A;   FLEXIBLE SIGMOIDOSCOPY N/A 02/22/2019   Procedure: FLEXIBLE SIGMOIDOSCOPY;  Surgeon: Toledo, Benay Pike, MD;  Location: [facility];  Service: Gastroenterology;  Laterality: N/A;   HARDWARE REMOVAL Right 11/12/2019   Procedure: Right ankle hardware removal;  Surgeon: Hessie Knows, MD;  Location: [facility];  Service: Orthopedics;  Laterality: Right;   HARDWARE REMOVAL Right 04/17/2020   Procedure: HARDWARE REMOVAL FROM RIGHT ANKLE AND RIGHT KNEE; IMPLANT OF CEMENT SPACERS IN RIGHT KNEE;  Surgeon: Dereck Leep, MD;  Location: [facility];  Service: Orthopedics;  Laterality: Right;   JOINT REPLACEMENT     ORIF ANKLE FRACTURE Right 03/14/2019   Procedure: OPEN REDUCTION INTERNAL FIXATION (ORIF) ANKLE FRACTURE, MEDIAL MALLEOLUS;  Surgeon: Hessie Knows, MD;  Location: [facility];  Service: Orthopedics;  Laterality: Right;   ORIF ANKLE FRACTURE Right 02/08/2019    Procedure: OPEN REDUCTION INTERNAL FIXATION (ORIF) ANKLE FRACTURE, post malleolus;  Surgeon: Hessie Knows, MD;  Location: [facility];  Service: Orthopedics;  Laterality: Right;   REPLACEMENT TOTAL KNEE Bilateral 2008   2009   SCAR DEBRIDEMENT OF TOTAL KNEE  01/2020   SYNDESMOSIS REPAIR Right 02/08/2019   Procedure: SYNDESMOSIS REPAIR;  Surgeon: Hessie Knows, MD;  Location: [facility];  Service: Orthopedics;  Laterality: Right;   TEE WITHOUT CARDIOVERSION N/A 02/26/2019   Procedure: TRANSESOPHAGEAL ECHOCARDIOGRAM (TEE);  Surgeon: Corey Skains, MD;  Location: [facility];  Service: Cardiovascular;  Laterality: N/A;   TOTAL KNEE REVISION WITH SCAR DEBRIDEMENT/PATELLA REVISION WITH POLY EXCHANGE Right 01/24/2020   Procedure: TOTAL KNEE REVISION WITH SCAR DEBRIDEMENT/PATELLA REVISION WITH POLY EXCHANGE;  Surgeon: Dereck Leep, MD;  Location: [facility];  Service: Orthopedics;  Laterality: Right;   TUMOR REMOVAL     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   Patient Active Problem List   Diagnosis Date Noted   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[place]) 04/17/2020   Anxiety 04/17/2020   History of GI diverticular bleed 04/17/2020   Chest pain 04/17/2020   PAD (peripheral artery disease) ([place]) 04/05/2020   Ulcer of right leg ([place]) 04/05/2020   COPD (chronic obstructive pulmonary disease) ([place]) 04/05/2020   GERD (gastroesophageal reflux disease) 04/05/2020   Abscess of right knee 01/22/2020   Pressure injury of skin 02/28/2019  SOB (shortness of breath)    Gastrointestinal hemorrhage    Sepsis (HCC)    Symptomatic anemia 02/20/2019   Trimalleolar fracture of ankle, closed, right, initial encounter 02/07/2019   Multiple lung nodules 03/31/2014   Extrinsic asthma 03/27/2014   OSA on CPAP 03/27/2014    PCP: Miller, Mark F, MD  REFERRING PROVIDER: Miller, Mark F, MD  REFERRING DIAG: Above knee amputation of right lower extremity  THERAPY DIAG:  S/P AKA (above knee amputation) unilateral, right  (HCC)  Muscle weakness (generalized)  Gait difficulty  Abnormal posture  Rationale for Evaluation and Treatment Rehabilitation  ONSET DATE: 12/02/20  SUBJECTIVE:  EVALUATION  PERTINENT HISTORY: Patient Profile:   Jasmine Morrow is a 76 y.o. female Chief Complaint  Patient presents with  Visit Follow Up  Doing well.   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 Cardiac cath 10/2011 showed completely normal coronary arteries, however she did have mild left ventricular dysfunction with anterior wall motion abnormality. Left ventricular EF was 51%.  History of chickenpox  Hyperlipidemia  patient states she has never been diagnosed with this  Hypertension  Knee joint replacement by other means  Macular degeneration  Major depressive disorder, recurrent, mild (CMS-HCC)  Medicare annual wellness visit, initial 08/09/2019  7/21  Multiple thyroid nodules  Normal coronary arteries 09/06/2013  Normal coronary anatomy by cardiac catheterization 10/12/11  Osteoarthritis  Ovarian cyst  PONV (postoperative nausea and vomiting)  nausea  Postoperative urinary retention 12/08/2020  Rash on lips 12/21/2020  Seasonal allergies  Sinusitis, unspecified  Sleep apnea  On CPAP.  Slow transit constipation 12/10/2020  Trimalleolar fracture of ankle, closed, right, initial encounter 02/07/2019  Complicated by  staph infection 1/21, 4 weeks IV Ancef, Menz  Ulcer   Past Surgical History:  Procedure Laterality Date  SALPINGO OOPHORECTOMY Bilateral 1993  Right total knee arthroplasty 10/11/2006  Dr Hooten  Left total knee arthroplasty 10/12/2007  Dr Hooten  COLONOSCOPY 07/30/2012  internal hemorrhoids, diverticulosis  ANKLE ARTHODESIS W/ ARTHROSCOPY Right 02/08/2019  03/14/2019 second surgery  COLONOSCOPY 02/22/2019  Blood in entire colon/Diverticulosis - Presumed diverticular bleed. No repeat recommended per TKT.  EGD 02/22/2019  Gastritis/gastric ulcers/Hiatal hernia/Otherwise normal - no repeat recommended per TKT.  ORIF ANKLE FRACTURE Right 03/14/2019  Dr. Menz  REMOVAL HARDWARE ANKLE FOOT/TOES Right 11/12/2019  Dr. Menz  Right knee arthrotomy, irrigation and debridement of the right knee, polyethylene exchange 01/24/2020  Dr Hooten  Removal of hardware (plates and screws) from the right ankle with irrigation, debridement, and placement of Stimulan antibiotic beads 04/17/2020  Dr Hooten  Right knee arthrotomy, extensive irrigation debridement, removal of right total knee implants, and placement of antibiotic impregnated polymethylmethacrylate cement spacer 04/17/2020  Dr Hooten  AMPUTATION LEG ABOVE KNEE AKA Right 12/02/2020  Procedure: AMPUTATION, THIGH, THROUGH FEMUR, ANY LEVEL; Surgeon: Chun, Danielle Sunae, MD; Location: [facility]; Service: Orthopedics; Laterality: Right;  TRANSFER ADJACENT TISSUE LEG Right 12/02/2020  Procedure: ADJACENT TISSUE TRANSFER OR REARRANGEMENT, LEG;   DEFECT 10 SQ CM OR LESS; Surgeon: Chun, Danielle Sunae, MD; Location: [facility]; Service: Orthopedics; Laterality: Right;  ASPIRATION/INJECTION MAJOR JOINT/BURSA KNEE Left 12/02/2020  Procedure: ARTHROCENTESIS, ASPIRATION AND/OR INJECTION, MAJOR JOINT OR BURSA, KNEE; WITHOUT ULTRASOUND GUIDANCE; Surgeon: Chun, Danielle Sunae, MD; Location: [facility]; Service: Orthopedics; Laterality: Left;  REMOVAL  KNEE PROSTHESIS W/POSSIBLE PLACEMENT SPACER Left 01/18/2021  Procedure: REMOVAL OF PROSTHESIS, INCLUDING TOTAL KNEE PROSTHESIS, METHYLMETHACRYLATE WITH OR WITHOUT INSERTION OF SPACER, KNEE; Surgeon: Jiranek, William Arthur, MD; Location: [facility]; Service: Orthopedics; Laterality: Left;  INSERTION NON-BIODEGRADABLE DRUG DELIVERY IMPLANT Left 01/18/2021  Procedure: INSERTION, KNEE, bioresorbable, biodegradable, NON-BIODEGRADABLE DRUG DELIVERY IMPLANT; Surgeon: Jiranek, William Arthur, MD; Location: [facility]; Service: Orthopedics; Laterality: Left;  ABOVE KNEE LEG AMPUTATION  12/02/2020  APPENDECTOMY Est 1968  CESAREAN SECTION  CHOLECYSTECTOMY  FRACTURE SURGERY 1/21  HYSTERECTOMY partial  JOINT REPLACEMENT 2008 & 2009  TUBAL LIGATION 3/79   ALLERGIES: Allergies  Allergen Reactions  Singulair [Montelukast] Other (See Comments)  Gland swelling  Sulfa (Sulfonamide Antibiotics) Swelling  Oxycodone Dizziness and Other (See Comments)  Vancomycin Analogues Other (See Comments)  Ringing in ears  Avinza [Morphine] Itching  Darvocet A500 [Propoxyphene N-Acetaminophen] Nausea  Nickel Rash  Ultracet [Tramadol-Acetaminophen] Rash  Vicodin [Hydrocodone-Acetaminophen] Vomiting  Vioxx [Rofecoxib] Other (See Comments)  GI   CURRENT MEDICATIONS: Current Outpatient Medications: acetaminophen (TYLENOL) 500 MG tablet, Take 1,000 mg by mouth 3 (three) times a day Twice daily per patient., PRN Not Currently Taking aspirin 81 MG EC tablet, Take 1 tablet (81 mg total) by mouth at bedtime, Taking calcium carbonate-vitamin D3 (CALTRATE 600+D) 600 mg-10 mcg (400 unit) tablet, Take 0.5 tablets by mouth 2 (two) times daily, Taking cetirizine (ZYRTEC) 10 mg capsule, Take 1 capsule (10 mg total) by mouth once daily, Taking escitalopram oxalate (LEXAPRO) 10 MG tablet, Take 1 tablet (10 mg total) by mouth once daily, Taking estradioL (ESTRACE) 0.01 % (0.1 mg/gram) vaginal cream, Place 2 g vaginally twice a  week, Taking folic acid/multivit,iron,miner (MULTIVIT-IRON-MIN-FOLIC ACID ORAL), Take 1 tablet by mouth once daily, Taking gabapentin (NEURONTIN) 300 MG capsule, Take 1 capsule (300 mg total) by mouth 3 (three) times daily Phantom limb pain, Taking melatonin 3 mg tablet, Take 1 tablet (3 mg total) by mouth at bedtime, Taking multivitamin with iron (COMPLETE MULTIVITAMIN-MINERAL) tablet, Take 1 tablet by mouth, Taking multivitamin with minerals, EYE, (PRESERVISION AREDS 2) soft gel capsule, Take 1 capsule by mouth 2 (two) times daily, Taking nystatin (MYCOSTATIN) 100,000 unit/gram powder, Apply small amount topically twice daily for rash under breasts, PRN Not Currently Taking pantoprazole (PROTONIX) 40 MG DR tablet, Take 1 tablet (40 mg total) by mouth 2 (two) times daily. Please call to schedule an appt. Thanks!, Taking polyethylene glycol (MIRALAX) powder, Take 17 g by mouth once daily Mix in 4-8ounces of fluid prior to taking., Taking sennosides-docusate (SENOKOT-S) 8.6-50 mg tablet, Take 1 tablet by mouth 2 (two) times daily For constipation, Taking lactose-reduced food (ENSURE PLUS ORAL), Take 237 mLs by mouth 2 (two) times daily With breakfast and dinner  HPI   CLINICAL SUMMARY:  Patient post right AKA and working with prosthesis. 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 Able to do housework, improving  PAIN:  Are you having pain? Yes: NPRS scale: 5/10 Pain location: L knee Pain description: aching Aggravating factors: walking Relieving factors: rest  PRECAUTIONS: None  WEIGHT BEARING RESTRICTIONS No  FALLS:  Has patient fallen in last 6 months? No  LIVING ENVIRONMENT:   Lives with: lives with their spouse Lives in: House/apartment Stairs: No Has following equipment at home: Gilford Rile - 4 wheeled and Wheelchair (manual)  OCCUPATION: retired  PLOF: Requires assistive device for independence  PATIENT GOALS  improve standing tolerance/ pull up  pants/ ambulate with [place] safely.     OBJECTIVE:   PATIENT SURVEYS:  FOTO initial 46/ goal 34.    COGNITION:  Overall cognitive status: Within functional limits for tasks assessed     SENSATION: WFL  POSTURE: rounded shoulders, forward head, flexed trunk , and weight shift left  PALPATION: No tenderness along R distal residual limb.  Discussed phantom limb sensation.    LOWER EXTREMITY ROM:  B LE AROM WFL except L knee extension secondary to articulating spacer.  Did not assess L/R hip extension at this time.    LOWER EXTREMITY MMT:  MMT Right eval Left eval  Hip flexion 4/5 4/5  Hip extension    Hip abduction 4+/5 4+/5  Hip adduction 4+/5 4+/5  Hip internal rotation    Hip external rotation    Knee flexion N/A 4+/5  Knee extension N/A 4+/5  Ankle dorsiflexion  5/5  Ankle plantarflexion    Ankle inversion    Ankle eversion     (Blank rows = not tested)  GAIT: Distance walked: in gym/ //-bars Assistive device utilized: Environmental consultant - 2 wheeled Level of assistance: CGA Comments: Cuing for posture correction with mirror feedback.  Flexed posture/ heavy UE assist.     TODAY'S TREATMENT:  10/19/21  Subjective:  Pt. Reports no pain or new complaints.  Pt. Entered PT in w/c and not wearing prosthetic leg.  Pt. Reports leg was not fitting great this morning and pt. Had a near fall.  PT addressed use of 5 ply sock and gel liner to improve prosthetic fit.     Objective:  Gait training:  Ambulate in clinic with RW/ min. A and moderate verbal cuing to correct upright posture.  Initially ambulates without ply sock and then PT reapplied sock in supine in order to improve fit of prosthetic leg.  Pt. Will bring 3ply sock next tx. Session.      Amb. From blue mat table to //-bars and then to Nustep.  Pt. has good control of R knee mechanism/ R heel strike.  No LOB but occasional cuing to correct posture/ head position.  Moderate fatigue after Nustep and pt. Transfers to w/c to  return to car.     Neuro.mm.:    Standing from blue mat table/ gray chair in front of //-bars/mirror with upright posture 5x.  Standing wt. Shifting with no UE assist in front of chair (CGA/min. A for safety/ verbal cuing).  Posture correction in seated/ standing position.       There.ex.:    Nustep L3 10 min. B UE/LE.  Consistent cadence for muscle endurance.    Seated marching/ hip abduction on blue mat table.  Discussed HEP/ use of prosthetic leg and walking at home.     PATIENT EDUCATION:  Education details: HEP/ gait and prosthetic training.  Person educated: Patient and Spouse Education method: Explanation, Demonstration, and Verbal cues Education comprehension: verbalized understanding, returned demonstration, and tactile cues required   HOME EXERCISE PROGRAM: Prone position hip stretches.    ASSESSMENT:  CLINICAL IMPRESSION: Pt. Benefits from donning prosthetic leg in supine position as compared to seated posture.  Once prosthetic leg fitted on leg, pt. ambulates around PT clinic with use of prosthetic leg/ RW with consistent recip.  Good R knee mechanism control during gait/ standing wt. Shifting in clinic.  Pt. Able to correct upright posture with verbal cuing/ mirror feedback. Pt. Requires heavy UE assist/ use of L LE to stand from chair and correct posture.  Pt. Will benefit from skilled PT services to improve standing tolerance/ gait independence and ADL.    OBJECTIVE IMPAIRMENTS Abnormal gait, decreased activity tolerance, decreased balance, decreased endurance, decreased mobility, difficulty walking, decreased ROM, decreased strength, decreased safety awareness, impaired flexibility, improper body mechanics, prosthetic dependency , and pain.   ACTIVITY LIMITATIONS carrying, lifting, standing, squatting, stairs, transfers, bed mobility, bathing, toileting, dressing, hygiene/grooming, and locomotion level  PARTICIPATION LIMITATIONS: cleaning, laundry, driving, shopping,  community activity, and yard work  [place] Past/current experiences are also affecting patient's functional outcome.   REHAB POTENTIAL: Good  CLINICAL DECISION MAKING: Evolving/moderate complexity  EVALUATION COMPLEXITY: High   GOALS: Goals reviewed with patient? Yes  SHORT TERM GOALS: Target date: 10/07/21 Pt. Able to don/doff R prosthetic leg independently to improve ADL/standing tolerance.  Baseline:  min. A to don/ doff prosthetic leg Goal status: Goal met   LONG TERM GOALS: Target date: 12/02/21  Pt. Will increase FOTO to 54 to improve functional mobility.  Baseline: initial FOTO 46 Goal status: INITIAL  2.  Pt. Able to tolerate standing 10 minutes while pulling up pants with mod. I safely while wearing prosthetic leg to improve daily activities.  Baseline:  limited standing tolerance Goal status: INITIAL  3.  Pt. Will ambulate 200 feet with use of SPC and mod. I to promote safety with household tasks/ walking into bathroom.   Baseline:  amb. With RW and min. A for safety.   Goal status: INITIAL   PLAN: PT FREQUENCY: 2x/week  PT DURATION: 12 weeks  PLANNED INTERVENTIONS: Therapeutic exercises, Therapeutic activity, Neuromuscular re-education, Balance training, Gait training, Patient/Family education, Self Care, Joint mobilization, Prosthetic training, DME instructions, and Manual therapy  PLAN FOR NEXT SESSION:  Discuss f/u with [place] for Caren Griffins (OT for driving).    Pura Spice, PT, DPT # [phone] 10/21/2021, 11:13 AM

## 2021-10-21 NOTE — Therapy (Signed)
OUTPATIENT PHYSICAL THERAPY LOWER EXTREMITY TREATMENT Physical Therapy Progress Note  Dates of reporting period  09/09/21  to  10/21/21  Patient Name: Jasmine Morrow MRN: [id number] DOB:[birth year], 76 y.o., female Today's Date: 10/21/2021   PT End of Session - 10/21/21 1414     Visit Number 10    Number of Visits 24    Date for PT Re-Evaluation 12/02/21    PT Start Time 1430    PT Stop Time 1521    PT Time Calculation (min) 51 min    Activity Tolerance Patient tolerated treatment well             Past Medical History:  Diagnosis Date   Anemia    Anxiety    Asthma    seasonal    GERD (gastroesophageal reflux disease)    Hypertension    PONV (postoperative nausea and vomiting)    Seasonal allergies    Sleep apnea    does not uses CPAP machine anymore    Past Surgical History:  Procedure Laterality Date   APPENDECTOMY     CESAREAN SECTION     CHOLECYSTECTOMY     DILATION AND CURETTAGE OF UTERUS     ESOPHAGOGASTRODUODENOSCOPY N/A 02/22/2019   Procedure: ESOPHAGOGASTRODUODENOSCOPY (EGD);  Surgeon: Toledo, Benay Pike, MD;  Location: [facility];  Service: Gastroenterology;  Laterality: N/A;   FLEXIBLE SIGMOIDOSCOPY N/A 02/22/2019   Procedure: FLEXIBLE SIGMOIDOSCOPY;  Surgeon: Toledo, Benay Pike, MD;  Location: [facility];  Service: Gastroenterology;  Laterality: N/A;   HARDWARE REMOVAL Right 11/12/2019   Procedure: Right ankle hardware removal;  Surgeon: Hessie Knows, MD;  Location: [facility];  Service: Orthopedics;  Laterality: Right;   HARDWARE REMOVAL Right 04/17/2020   Procedure: HARDWARE REMOVAL FROM RIGHT ANKLE AND RIGHT KNEE; IMPLANT OF CEMENT SPACERS IN RIGHT KNEE;  Surgeon: Dereck Leep, MD;  Location: [facility];  Service: Orthopedics;  Laterality: Right;   JOINT REPLACEMENT     ORIF ANKLE FRACTURE Right 03/14/2019   Procedure: OPEN REDUCTION INTERNAL FIXATION (ORIF) ANKLE FRACTURE, MEDIAL MALLEOLUS;  Surgeon: Hessie Knows, MD;  Location: [facility];   Service: Orthopedics;  Laterality: Right;   ORIF ANKLE FRACTURE Right 02/08/2019   Procedure: OPEN REDUCTION INTERNAL FIXATION (ORIF) ANKLE FRACTURE, post malleolus;  Surgeon: Hessie Knows, MD;  Location: [facility];  Service: Orthopedics;  Laterality: Right;   REPLACEMENT TOTAL KNEE Bilateral 2008   2009   SCAR DEBRIDEMENT OF TOTAL KNEE  01/2020   SYNDESMOSIS REPAIR Right 02/08/2019   Procedure: SYNDESMOSIS REPAIR;  Surgeon: Hessie Knows, MD;  Location: [facility];  Service: Orthopedics;  Laterality: Right;   TEE WITHOUT CARDIOVERSION N/A 02/26/2019   Procedure: TRANSESOPHAGEAL ECHOCARDIOGRAM (TEE);  Surgeon: Corey Skains, MD;  Location: [facility];  Service: Cardiovascular;  Laterality: N/A;   TOTAL KNEE REVISION WITH SCAR DEBRIDEMENT/PATELLA REVISION WITH POLY EXCHANGE Right 01/24/2020   Procedure: TOTAL KNEE REVISION WITH SCAR DEBRIDEMENT/PATELLA REVISION WITH POLY EXCHANGE;  Surgeon: Dereck Leep, MD;  Location: [facility];  Service: Orthopedics;  Laterality: Right;   TUMOR REMOVAL     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   Patient Active Problem List   Diagnosis Date Noted   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[place]) 04/17/2020   Anxiety 04/17/2020   History of GI diverticular bleed 04/17/2020   Chest pain 04/17/2020   PAD (peripheral artery disease) ([place]) 04/05/2020   Ulcer of right leg ([place]) 04/05/2020   COPD (chronic obstructive pulmonary disease) ([place]) 04/05/2020   GERD (gastroesophageal reflux disease) 04/05/2020  Abscess of right knee 01/22/2020   Pressure injury of skin 02/28/2019   SOB (shortness of breath)    Gastrointestinal hemorrhage    Sepsis ([place])    Symptomatic anemia 02/20/2019   Trimalleolar fracture of ankle, closed, right, initial encounter 02/07/2019   Multiple lung nodules 03/31/2014   Extrinsic asthma 03/27/2014   OSA on CPAP 03/27/2014    PCP: Rusty Aus, MD  REFERRING PROVIDER: Rusty Aus, MD  REFERRING DIAG: Above knee amputation of right  lower extremity  THERAPY DIAG:  S/P AKA (above knee amputation) unilateral, right (HCC)  Muscle weakness (generalized)  Gait difficulty  Abnormal posture  Rationale for Evaluation and Treatment Rehabilitation  ONSET DATE: 12/02/20  SUBJECTIVE:  EVALUATION  PERTINENT HISTORY: Patient Profile:   Jasmine Morrow is a 76 y.o. female Chief Complaint  Patient presents with  Visit Follow Up  Doing well.   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 Cardiac cath 10/2011 showed completely normal coronary arteries, however she did have mild left ventricular dysfunction with anterior wall motion abnormality. Left ventricular EF was 51%.  History of chickenpox  Hyperlipidemia  patient states she has never been diagnosed with this  Hypertension  Knee joint replacement by other means  Macular degeneration  Major depressive disorder, recurrent, mild (CMS-HCC)  Medicare annual wellness visit, initial 08/09/2019  7/21  Multiple thyroid nodules  Normal coronary arteries 09/06/2013  Normal coronary anatomy by cardiac catheterization 10/12/11  Osteoarthritis  Ovarian cyst  PONV (postoperative nausea and vomiting)  nausea  Postoperative urinary retention 12/08/2020  Rash on lips 12/21/2020  Seasonal allergies  Sinusitis, unspecified  Sleep apnea  On CPAP.  Slow transit constipation 12/10/2020   Trimalleolar fracture of ankle, closed, right, initial encounter 26/37/8588  Complicated by staph infection 1/21, 4 weeks IV Ancef, Menz  Ulcer   Past Surgical History:  Procedure Laterality Date  SALPINGO OOPHORECTOMY Bilateral 1993  Right total knee arthroplasty 10/11/2006  Dr Marry Guan  Left total knee arthroplasty 10/12/2007  Dr Marry Guan  COLONOSCOPY 07/30/2012  internal hemorrhoids, diverticulosis  ANKLE ARTHODESIS W/ ARTHROSCOPY Right 02/08/2019  03/14/2019 second surgery  COLONOSCOPY 02/22/2019  Blood in entire colon/Diverticulosis - Presumed diverticular bleed. No repeat recommended per TKT.  EGD 02/22/2019  Gastritis/gastric ulcers/Hiatal hernia/Otherwise normal - no repeat recommended per TKT.  ORIF ANKLE FRACTURE Right 03/14/2019  Dr. Rudene Christians  REMOVAL HARDWARE ANKLE FOOT/TOES Right 11/12/2019  Dr. Rudene Christians  Right knee arthrotomy, irrigation and debridement of the right knee, polyethylene exchange 01/24/2020  Dr Marry Guan  Removal of hardware (plates and screws) from the right ankle with irrigation, debridement, and placement of Stimulan antibiotic beads 04/17/2020  Dr Marry Guan  Right knee arthrotomy, extensive irrigation debridement, removal of right total knee implants, and placement of antibiotic impregnated polymethylmethacrylate cement spacer 04/17/2020  Dr Marry Guan  AMPUTATION LEG ABOVE KNEE AKA Right 12/02/2020  Procedure: AMPUTATION, THIGH, THROUGH FEMUR, ANY LEVEL; Surgeon: Lyndle Herrlich, MD; Location: [place]; Service: Orthopedics; Laterality: Right;  TRANSFER ADJACENT TISSUE LEG Right 12/02/2020  Procedure: ADJACENT TISSUE TRANSFER OR REARRANGEMENT, LEG; DEFECT 10 SQ CM OR LESS; Surgeon: Lyndle Herrlich, MD; Location: [place]; Service: Orthopedics; Laterality: Right;  ASPIRATION/INJECTION MAJOR JOINT/BURSA KNEE Left 12/02/2020  Procedure: ARTHROCENTESIS, ASPIRATION AND/OR INJECTION, MAJOR JOINT OR BURSA, KNEE; WITHOUT ULTRASOUND GUIDANCE; Surgeon: Lyndle Herrlich, MD; Location: [place]; Service: Orthopedics; Laterality: Left;  REMOVAL KNEE PROSTHESIS W/POSSIBLE PLACEMENT SPACER Left 01/18/2021  Procedure: REMOVAL OF PROSTHESIS, INCLUDING TOTAL KNEE PROSTHESIS, METHYLMETHACRYLATE WITH OR WITHOUT INSERTION OF SPACER, KNEE; Surgeon: Ihor Dow, MD; Location: [place]; Service: Orthopedics; Laterality: Left;  INSERTION NON-BIODEGRADABLE DRUG DELIVERY IMPLANT Left 01/18/2021  Procedure: INSERTION, KNEE, bioresorbable, biodegradable, NON-BIODEGRADABLE DRUG DELIVERY IMPLANT; Surgeon: Ihor Dow, MD; Location: [place]; Service: Orthopedics; Laterality: Left;  ABOVE KNEE LEG AMPUTATION  12/02/2020  APPENDECTOMY Est 1968  CESAREAN SECTION  CHOLECYSTECTOMY  FRACTURE SURGERY 1/21  HYSTERECTOMY partial  JOINT REPLACEMENT 2008 & 2009  TUBAL LIGATION 3/79   ALLERGIES: Allergies  Allergen Reactions  Singulair [Montelukast] Other (See Comments)  Gland swelling  Sulfa (Sulfonamide Antibiotics) Swelling  Oxycodone Dizziness and Other (See Comments)  Vancomycin Analogues Other (See Comments)  Ringing in ears  Avinza [Morphine] Itching  Darvocet A500 [Propoxyphene N-Acetaminophen] Nausea  Nickel Rash  Ultracet [Tramadol-Acetaminophen] Rash  Vicodin [Hydrocodone-Acetaminophen] Vomiting  Vioxx [Rofecoxib] Other (See Comments)  GI   CURRENT MEDICATIONS: Current Outpatient Medications: acetaminophen (TYLENOL) 500 MG tablet, Take 1,000 mg by mouth 3 (three) times a day Twice daily per patient., PRN Not Currently Taking aspirin 81 MG EC tablet, Take 1 tablet (81 mg total) by mouth at bedtime, Taking calcium carbonate-vitamin D3 (CALTRATE 600+D) 600 mg-10 mcg (400 unit) tablet, Take 0.5 tablets by mouth 2 (two) times daily, Taking cetirizine (ZYRTEC) 10 mg capsule, Take 1 capsule (10 mg total) by mouth once daily, Taking escitalopram oxalate (LEXAPRO) 10 MG tablet, Take 1 tablet (10 mg total) by mouth once  daily, Taking estradioL (ESTRACE) 0.01 % (0.1 mg/gram) vaginal cream, Place 2 g vaginally twice a week, Taking folic acid/multivit,iron,miner (MULTIVIT-IRON-MIN-FOLIC ACID ORAL), Take 1 tablet by mouth once daily, Taking gabapentin (NEURONTIN) 300 MG capsule, Take 1 capsule (300 mg total) by mouth 3 (three) times daily Phantom limb pain, Taking melatonin 3 mg tablet, Take 1 tablet (3 mg total) by mouth at bedtime, Taking multivitamin with iron (COMPLETE MULTIVITAMIN-MINERAL) tablet, Take 1 tablet by mouth, Taking multivitamin with minerals, EYE, (PRESERVISION AREDS 2) soft gel capsule, Take 1 capsule by mouth 2 (two) times daily, Taking nystatin (MYCOSTATIN) 100,000 unit/gram powder, Apply small amount topically twice daily for rash under breasts, PRN Not Currently Taking pantoprazole (PROTONIX) 40 MG DR tablet, Take 1 tablet (40 mg total) by mouth 2 (two) times daily. Please call to schedule an appt. Thanks!, Taking polyethylene glycol (MIRALAX) powder, Take 17 g by mouth once daily Mix in 4-8ounces of fluid prior to taking., Taking sennosides-docusate (SENOKOT-S) 8.6-50 mg tablet, Take 1 tablet by mouth 2 (two) times daily For constipation, Taking lactose-reduced food (ENSURE PLUS ORAL), Take 237 mLs by mouth 2 (two) times daily With breakfast and dinner  HPI   CLINICAL SUMMARY:  Patient post right AKA and working with prosthesis. 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 Able to do housework, improving  PAIN:  Are you having pain? Yes: NPRS scale: 5/10 Pain location: L knee Pain description: aching Aggravating factors: walking Relieving factors: rest  PRECAUTIONS: None  WEIGHT BEARING RESTRICTIONS No  FALLS:  Has patient fallen in last 6 months? No  LIVING ENVIRONMENT: Lives with: lives with their spouse Lives in: House/apartment Stairs: No Has following equipment at home: Gilford Rile - 4 wheeled and Wheelchair (manual)  OCCUPATION: retired  PLOF:  Requires assistive device for independence  PATIENT GOALS  improve standing tolerance/ pull up pants/ ambulate with [place] safely.     OBJECTIVE:   PATIENT SURVEYS:  FOTO initial 46/ goal 50.    COGNITION:  Overall cognitive status: Within functional limits for tasks assessed     SENSATION: WFL  POSTURE: rounded shoulders, forward head, flexed trunk , and weight shift left  PALPATION: No tenderness along R distal residual limb.  Discussed phantom limb sensation.    LOWER EXTREMITY ROM:  B LE AROM WFL except L knee extension secondary to articulating spacer.  Did not assess L/R hip extension at this time.    LOWER EXTREMITY MMT:  MMT Right eval Left eval  Hip flexion 4/5 4/5  Hip extension    Hip abduction 4+/5 4+/5  Hip adduction 4+/5 4+/5  Hip internal rotation    Hip external rotation    Knee flexion N/A 4+/5  Knee extension N/A 4+/5  Ankle dorsiflexion  5/5  Ankle plantarflexion    Ankle inversion    Ankle eversion     (Blank rows = not tested)  GAIT: Distance walked: in gym/ //-bars Assistive device utilized: Environmental consultant - 2 wheeled Level of assistance: CGA Comments: Cuing for posture correction with mirror feedback.  Flexed posture/ heavy UE assist.     TODAY'S TREATMENT:  10/21/21  Subjective:  Pt. Reports no pain or new complaints.  Pt. Has f/u with Staci Righter and no adjustments to prosthetic leg at this time.     Objective:   Neuro.mm.:   Standing from blue mat table with upright posture 3x.  Standing wt. Shifting with no UE assist in front of blue mat table (CGA/min. A for safety/ verbal cuing).  Posture correction in seated/ standing position.  [facility] reaching to elevated hospital table R/L UE (CGA for safety).  Standing shoulder flexion/ scapular retraction with CGA.      There.ex.:    Nustep L3 10 min. B UE/LE (seat #12).  Consistent cadence for muscle endurance.    Seated marching/ hip abduction on blue mat table.  Discussed HEP/ use of prosthetic leg  and walking at home.    Gait training:   Ambulate from blue mat table to //-bars with RW and CGA with cuing to correct head position/ upright posture.  Walking in //-bars (3 laps) with consistent BOS and good turning.  1 episodes of catching R toe during hip flexion and able to correct with no assist.     Amb. In //-bar working on decrease R UE assist on //-bars.  Difficulty with R hip flexion without use of B UE support on //-bars.  Amb. From //-bars   Amb. From Nustep to passenger seat of car with RW and consistent step pattern.   No LOB and good BOS but moderate cuing to correct posture.  Discussed getting portable car handle to door to assist with getting into car safely.    BP 134/75 at end of tx. Session.        PATIENT EDUCATION:  Education details: HEP/ gait and prosthetic training.  Person educated: Patient and Spouse Education method: Explanation, Demonstration, and Verbal cues Education comprehension: verbalized understanding, returned demonstration, and tactile cues required   HOME EXERCISE PROGRAM: Prone position  hip stretches/ Standing marching at kitchen counter with w/c behind patient.      ASSESSMENT:  CLINICAL IMPRESSION: Pt. ambulates around PT clinic with use of prosthetic leg/ RW with consistent recip.   Good R knee mechanism control during gait/ standing wt. Shifting in clinic.  PT working on standing wt. Shifting and ambulating with decrease UE support in //-bars.  Pt. Continues to require B UE assist, esp. With swing through phase of R LE.   Pt. Will benefit from skilled PT services to improve standing tolerance/ gait independence and ADL.    OBJECTIVE IMPAIRMENTS Abnormal gait, decreased activity tolerance, decreased balance, decreased endurance, decreased mobility, difficulty walking, decreased ROM, decreased strength, decreased safety awareness, impaired flexibility, improper body mechanics, prosthetic dependency , and pain.   ACTIVITY LIMITATIONS carrying,  lifting, standing, squatting, stairs, transfers, bed mobility, bathing, toileting, dressing, hygiene/grooming, and locomotion level  PARTICIPATION LIMITATIONS: cleaning, laundry, driving, shopping, community activity, and yard work  [place] Past/current experiences are also affecting patient's functional outcome.   REHAB POTENTIAL: Good  CLINICAL DECISION MAKING: Evolving/moderate complexity  EVALUATION COMPLEXITY: High   GOALS: Goals reviewed with patient? Yes  SHORT TERM GOALS: Target date: 10/07/21 Pt. Able to don/doff R prosthetic leg independently to improve ADL/standing tolerance.  Baseline:  min. A to don/ doff prosthetic leg Goal status: Goal met   LONG TERM GOALS: Target date: 12/02/21  Pt. Will increase FOTO to 54 to improve functional mobility.  Baseline: initial FOTO 46 Goal status: On-going  2.  Pt. Able to tolerate standing 10 minutes while pulling up pants with mod. I safely while wearing prosthetic leg to improve daily activities.  Baseline:  limited standing tolerance Goal status: INITIAL  3.  Pt. Will ambulate 200 feet with use of SPC and mod. I to promote safety with household tasks/ walking into bathroom.   Baseline:  amb. With RW and min. A for safety.   Goal status: INITIAL   PLAN: PT FREQUENCY: 2x/week  PT DURATION: 12 weeks  PLANNED INTERVENTIONS: Therapeutic exercises, Therapeutic activity, Neuromuscular re-education, Balance training, Gait training, Patient/Family education, Self Care, Joint mobilization, Prosthetic training, DME instructions, and Manual therapy  PLAN FOR NEXT SESSION:  Issue info on driving with Caren Griffins Passenger transport manager).    Pura Spice, PT, DPT # [phone] 10/21/2021, 4:23 PM

## 2021-10-23 IMAGING — CT CT ANKLE*R* W/O CM
3 series · 13 of 33 positions shown, 16 images · non-contrast
Comparison: None.

CLINICAL DATA: Ankle trauma, slipped and fell in the yard.

EXAM:
CT OF THE RIGHT ANKLE WITHOUT CONTRAST
TECHNIQUE: Multidetector CT imaging of the right ankle was performed according
to the standard protocol. Multiplanar CT image reconstructions were
also generated.

[Series 8: axial st · axial · 0.26mm/px · z∈[+51,+175]mm · 5 of 181 slices shown, 7 images]
[im 28/181  soft-tissue]
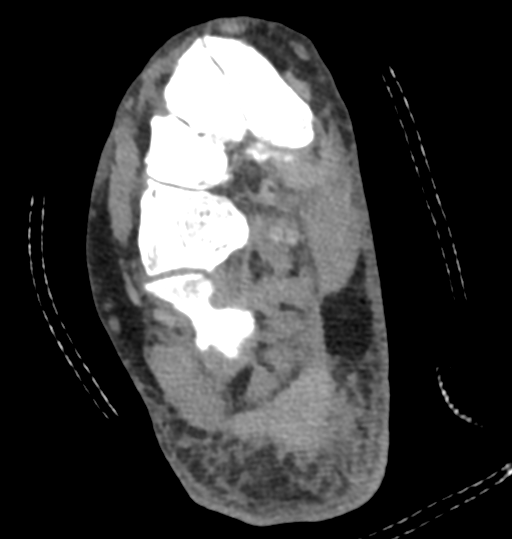
[im 28/181  bone]
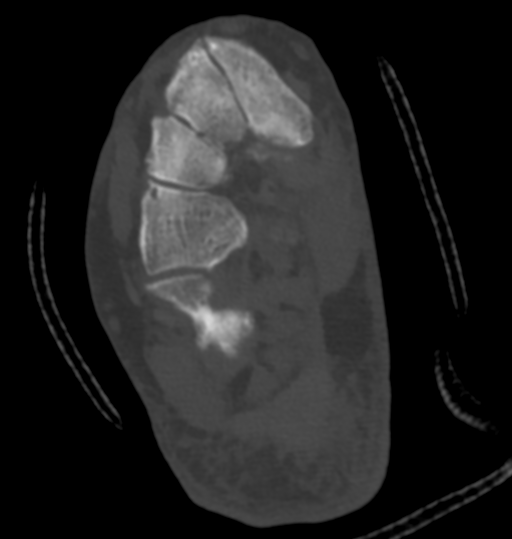
[im 56/181  bone]
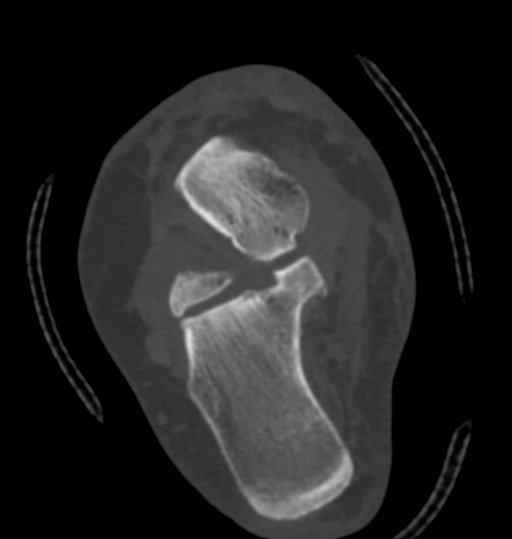
[im 97/181  bone]
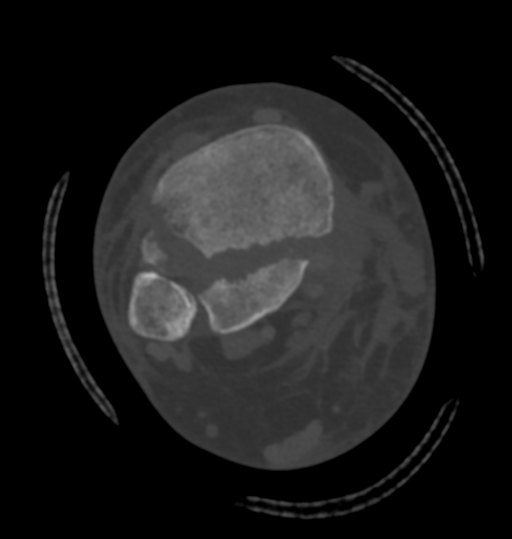
[im 125/181  bone]
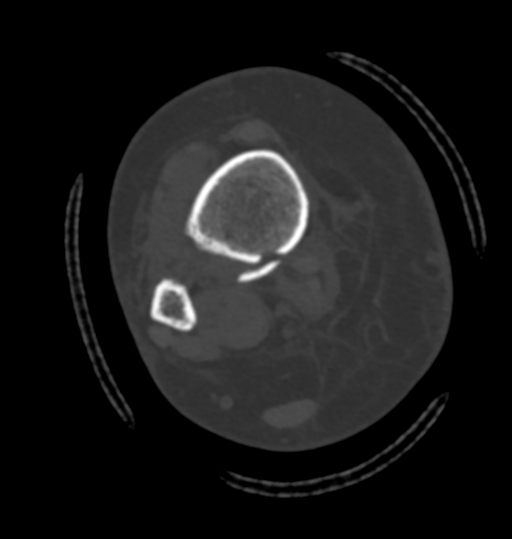
[im 153/181  soft-tissue]
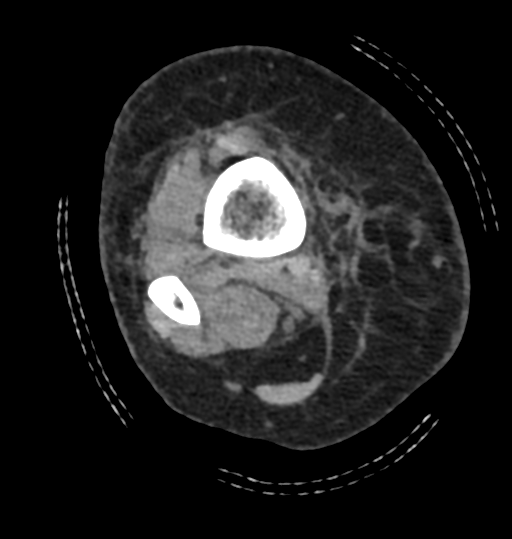
[im 153/181  bone]
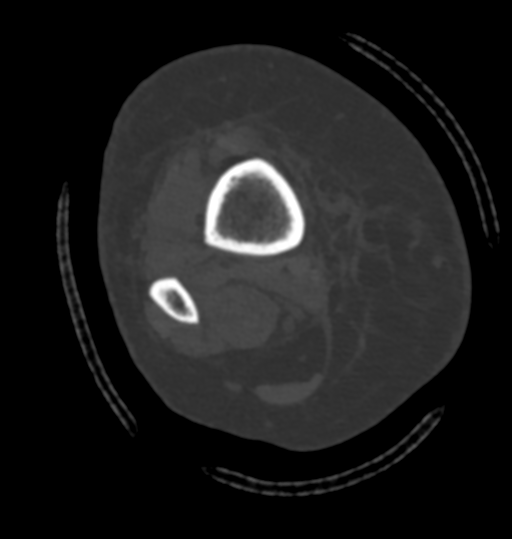

[Series 9: cor st · coronal · 0.26mm/px · 3 of 143 slices shown]
[im 29/143  bone]
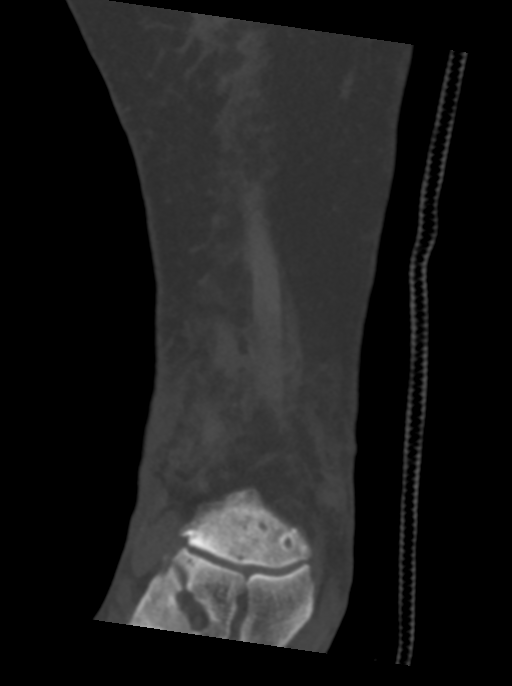
[im 57/143  bone]
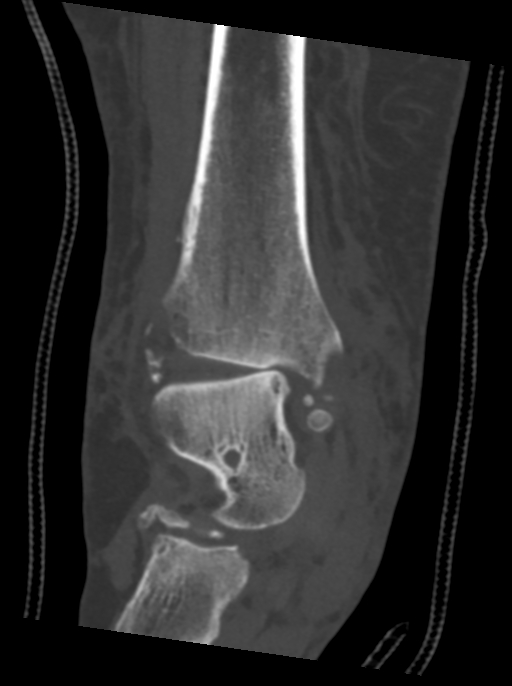
[im 86/143  bone]
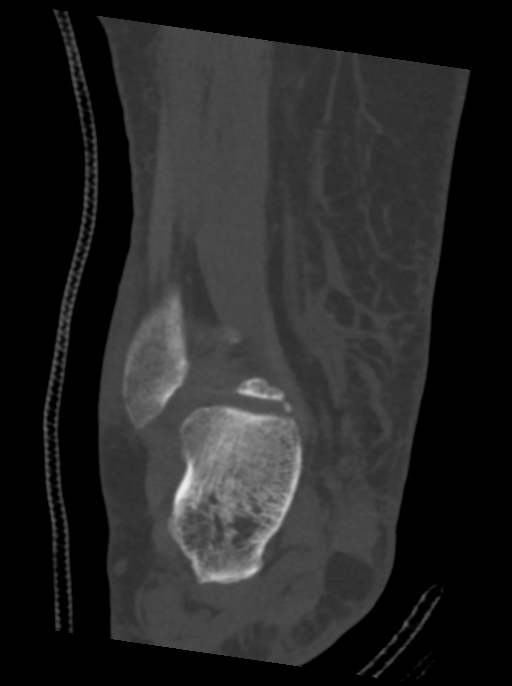

[Series 10: sag st · sagittal · 0.28mm/px · 5 of 103 slices shown, 6 images]
[im 35/103  bone]
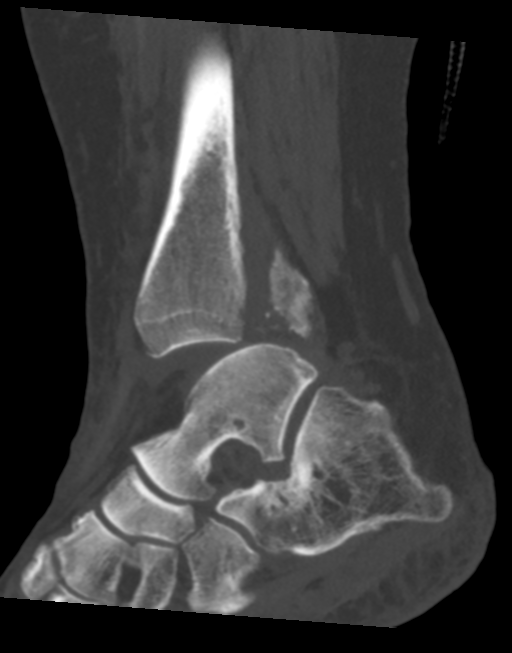
[im 43/103  bone]
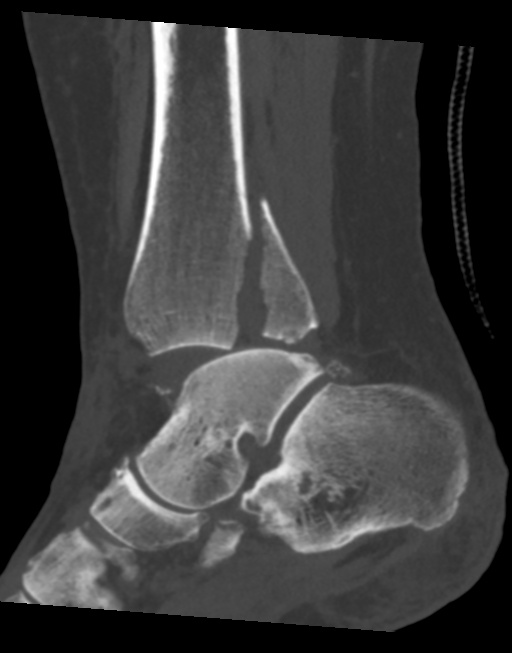
[im 52/103  soft-tissue]
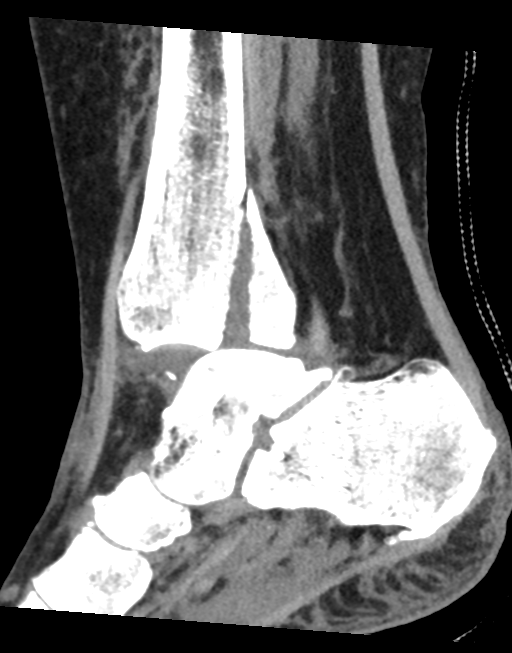
[im 52/103  bone]
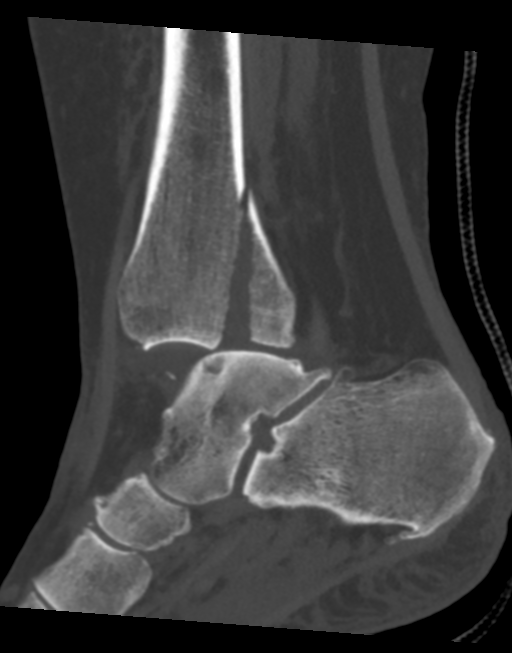
[im 60/103  bone]
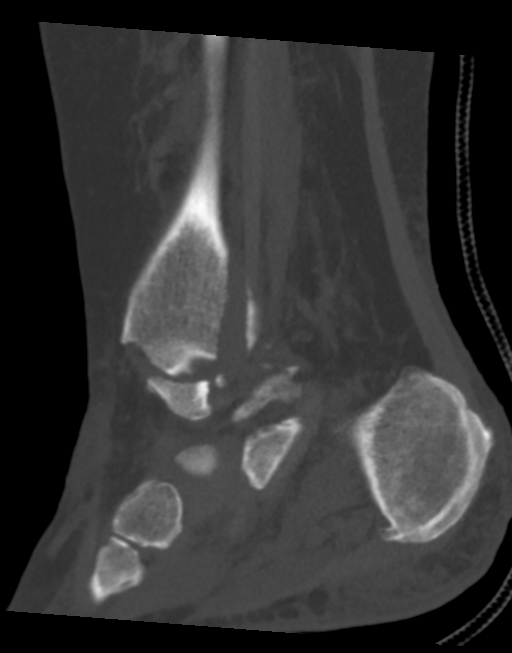
[im 69/103  bone]
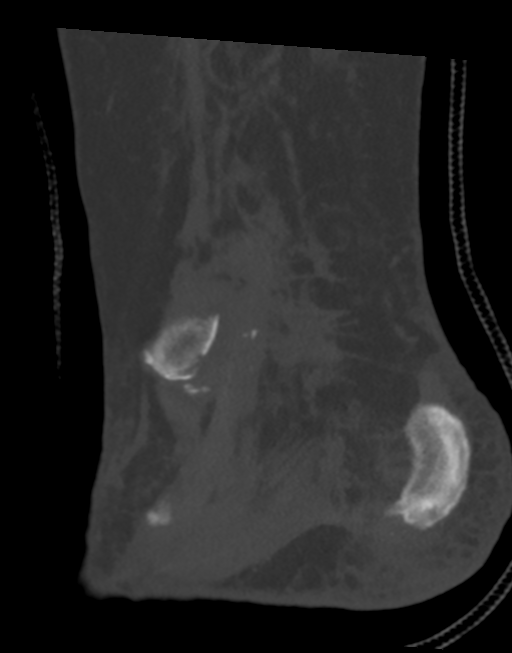

[13 of 33 positions shown; findings below may reference images not displayed]

FINDINGS: Bones/Joint/Cartilage

Acute comminuted fracture of the distal posterior tibial malleolus
with 9 mm of distraction at the articular surface. Comminuted and
displaced fracture of the medial malleolus. Comminuted fracture of
the anterolateral corner of tibial plafond. Posterior subluxation of
the talus relative to the tibia.

Subchondral cystic changes in the medial and lateral corners of the
talar dome.

Well corticated bony fragments at the tip of the medial malleolus
consistent with sequela prior avulsive injury.

Mild osteoarthritis of the subtalar joints. Mild osteoarthritis of
the talonavicular joint. Mild osteoarthritis of the
navicular-cuneiform joints. Mild osteoarthritis of the second and
third tarsometatarsal joints.

Small plantar calcaneal spur.  No aggressive osseous lesion.

Ligaments

Ligaments are suboptimally evaluated by CT.

Muscles and Tendons
Muscles are normal. No muscle atrophy. Flexor, extensor, peroneal
and Achilles tendons are intact. Posterior tibial tendon courses
immediately lateral to the major fracture fragment of the posterior
malleolus of the distal tibia.

Soft tissue
No fluid collection or hematoma.  No soft tissue mass.
IMPRESSION: 1. Acute comminuted fracture of the distal posterior tibial
malleolus with 9 mm of distraction at the articular surface.
Comminuted and displaced fracture of the medial malleolus.
Comminuted fracture of the anterolateral corner of tibial plafond.
Posterior subluxation of the talus relative to the tibia.

## 2021-10-24 IMAGING — DX DG CHEST 1V PORT
1 series · 2 of 2 positions shown · non-contrast
Comparison: 09/25/2007, 08/17/2015

CLINICAL DATA: Hypertension, ankle fracture

EXAM:
PORTABLE CHEST 1 VIEW

[Series 1: chest ap · 0.14mm/px · 2 of 2 slices shown]
[im 1/2]
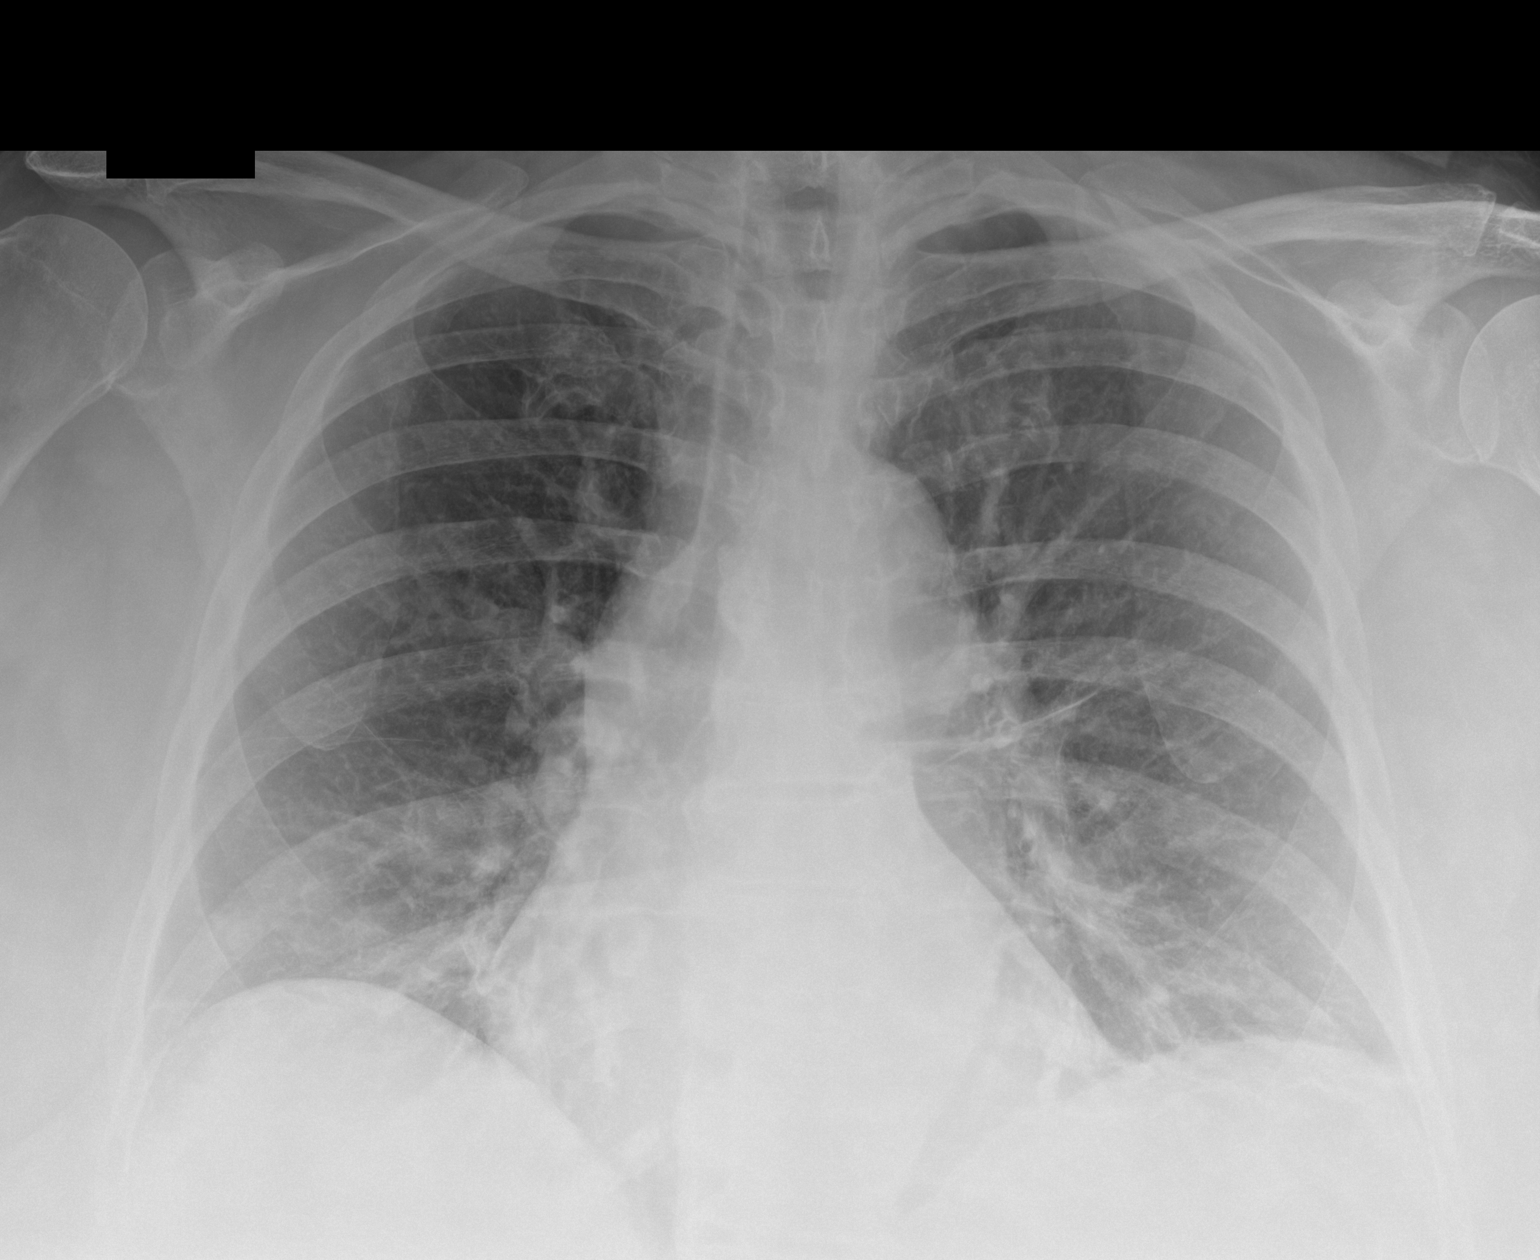
[im 2/2]
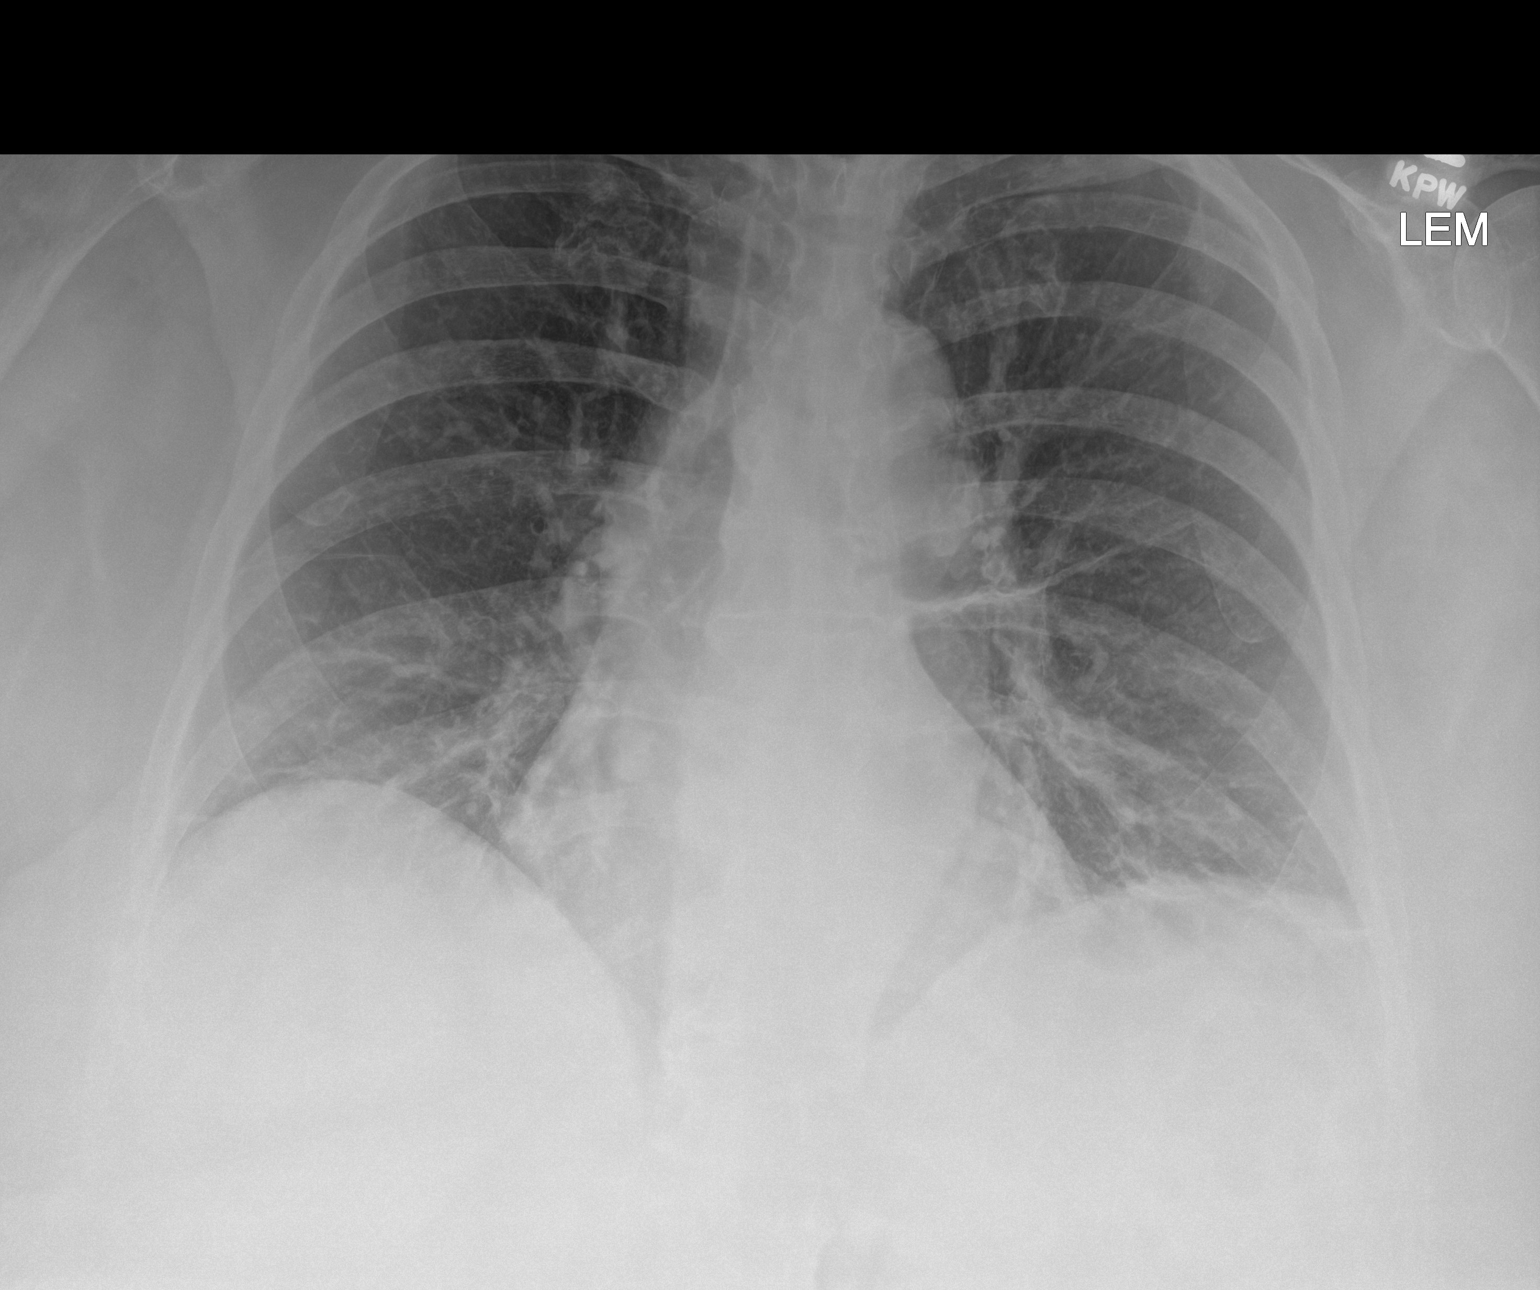

[2 of 2 positions shown; findings below may reference images not displayed]

FINDINGS: The heart size and mediastinal contours are within normal limits.
Streaky bibasilar opacities. Linear scarring in the left hilar
region. No pleural effusion or pneumothorax. The visualized
skeletal structures are unremarkable.
IMPRESSION: Streaky bibasilar opacities which could reflect atelectasis or
infection.

## 2021-10-26 ENCOUNTER — Ambulatory Visit: Payer: Medicare Other | Admitting: Physical Therapy

## 2021-10-26 DIAGNOSIS — R269 Unspecified abnormalities of gait and mobility: Secondary | ICD-10-CM

## 2021-10-26 DIAGNOSIS — R293 Abnormal posture: Secondary | ICD-10-CM

## 2021-10-26 DIAGNOSIS — M6281 Muscle weakness (generalized): Secondary | ICD-10-CM

## 2021-10-26 DIAGNOSIS — Z89611 Acquired absence of right leg above knee: Secondary | ICD-10-CM

## 2021-10-28 ENCOUNTER — Ambulatory Visit: Payer: Medicare Other | Admitting: Physical Therapy

## 2021-10-28 DIAGNOSIS — Z89611 Acquired absence of right leg above knee: Secondary | ICD-10-CM

## 2021-10-28 DIAGNOSIS — R269 Unspecified abnormalities of gait and mobility: Secondary | ICD-10-CM

## 2021-10-28 DIAGNOSIS — R293 Abnormal posture: Secondary | ICD-10-CM

## 2021-10-28 DIAGNOSIS — M6281 Muscle weakness (generalized): Secondary | ICD-10-CM

## 2021-10-28 IMAGING — CR DG THORACIC SPINE 2V
3 series · 3 of 3 positions shown · non-contrast
Comparison: None.

CLINICAL DATA: Back pain

EXAM:
THORACIC SPINE 2 VIEWS

[t-spine ap]
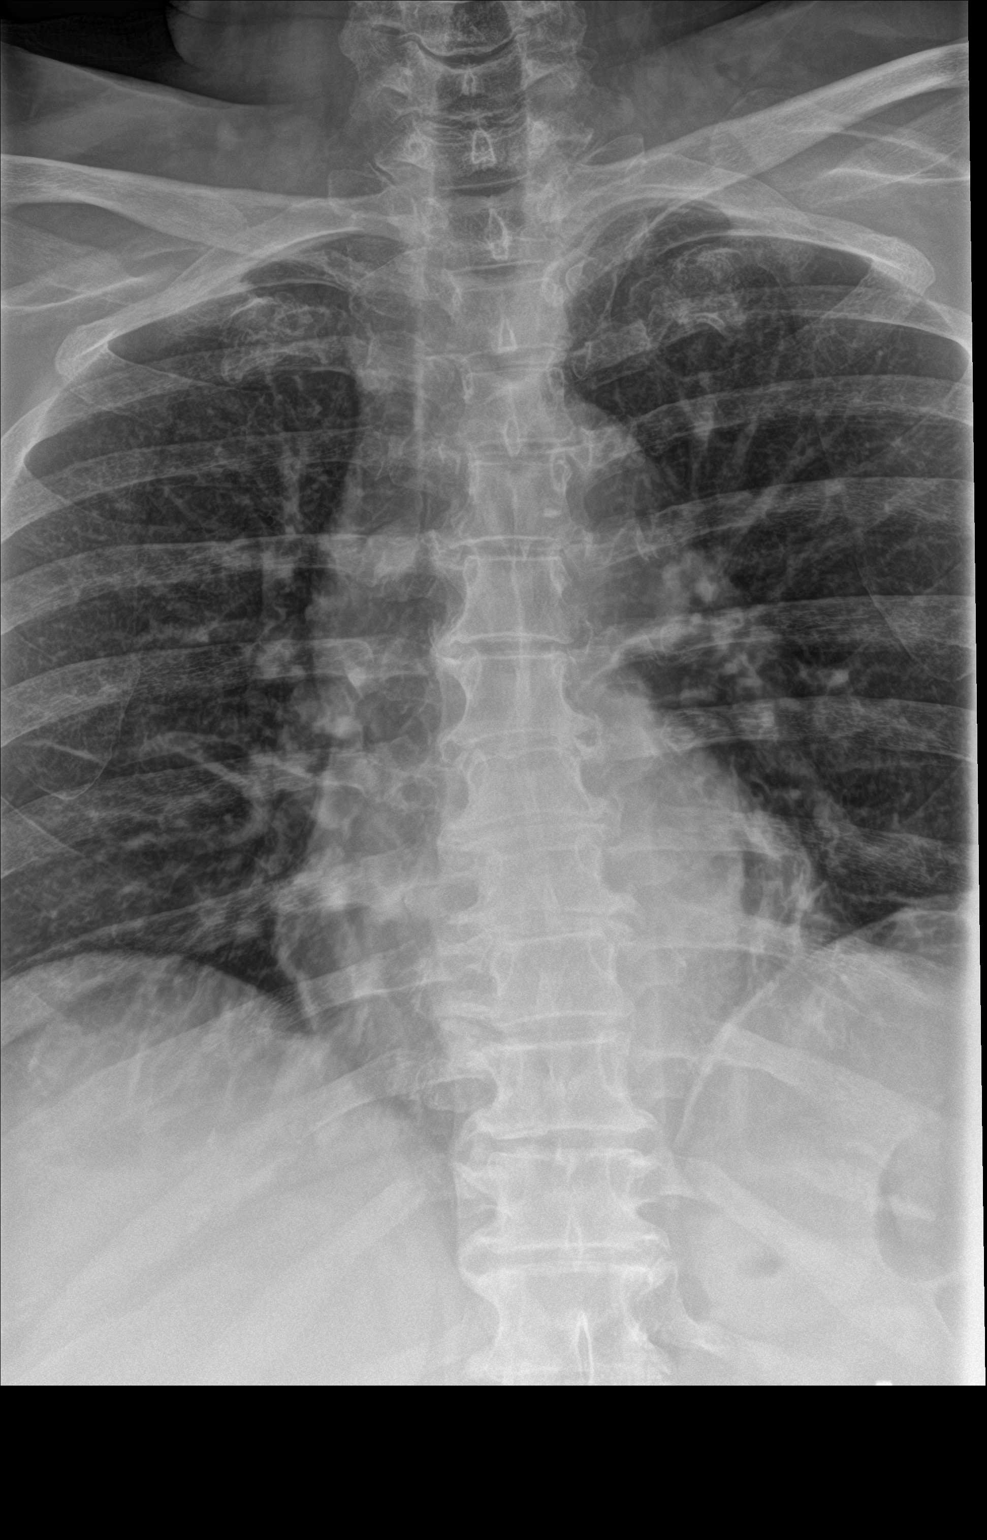

[t-spine lat]
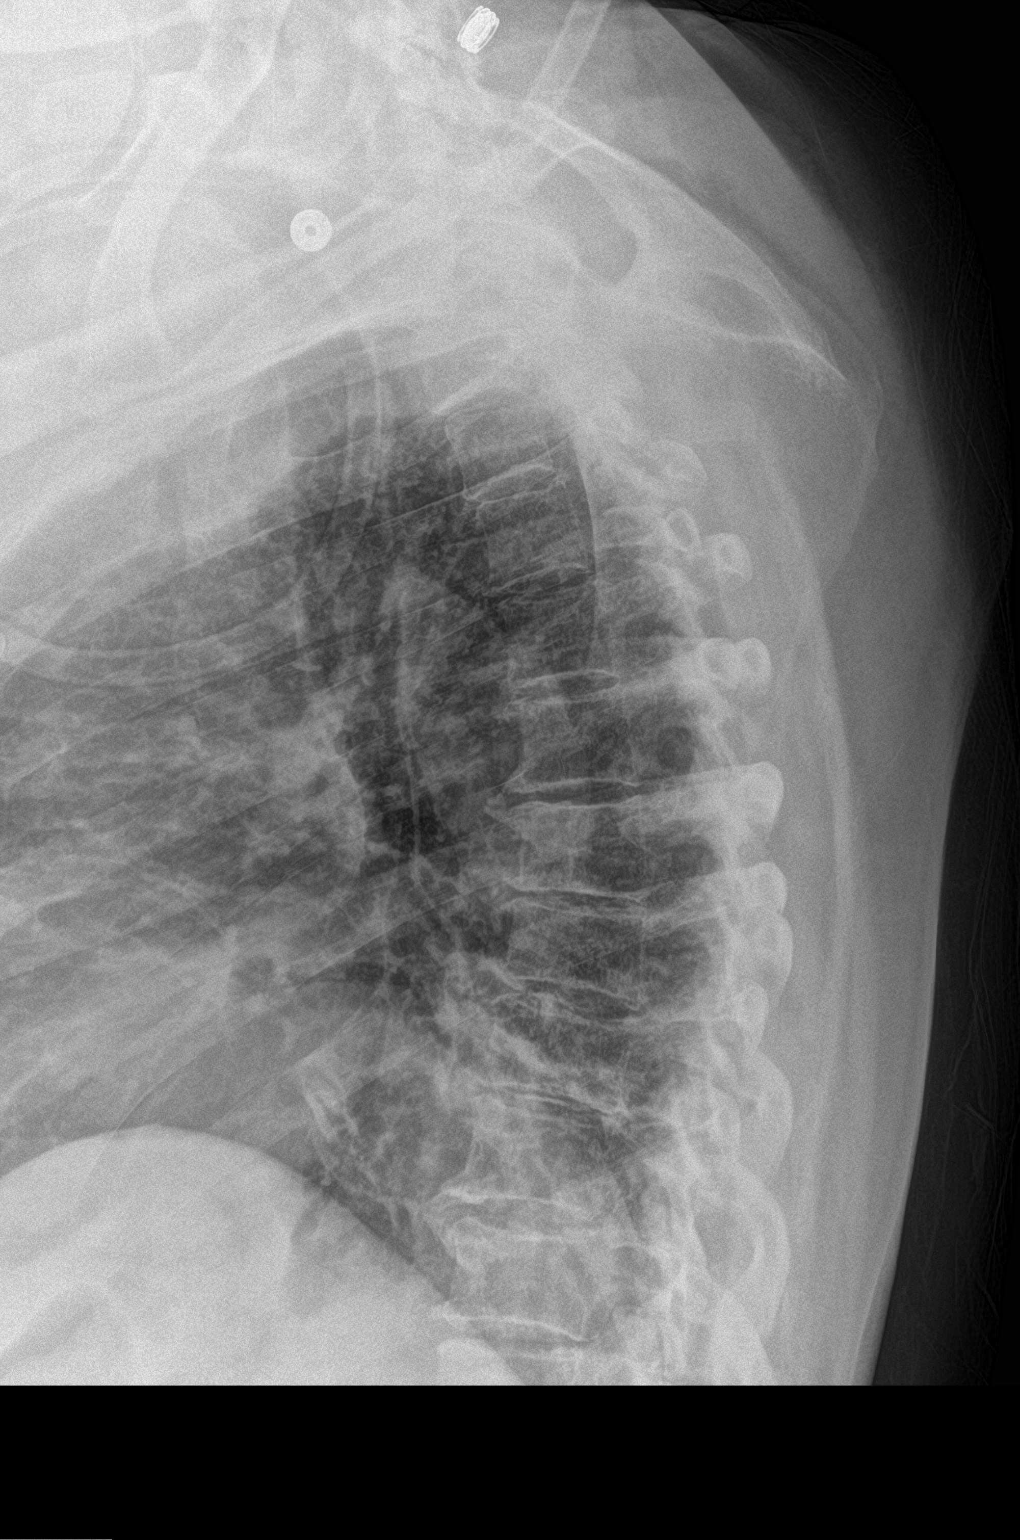

[t-spine swimmers]
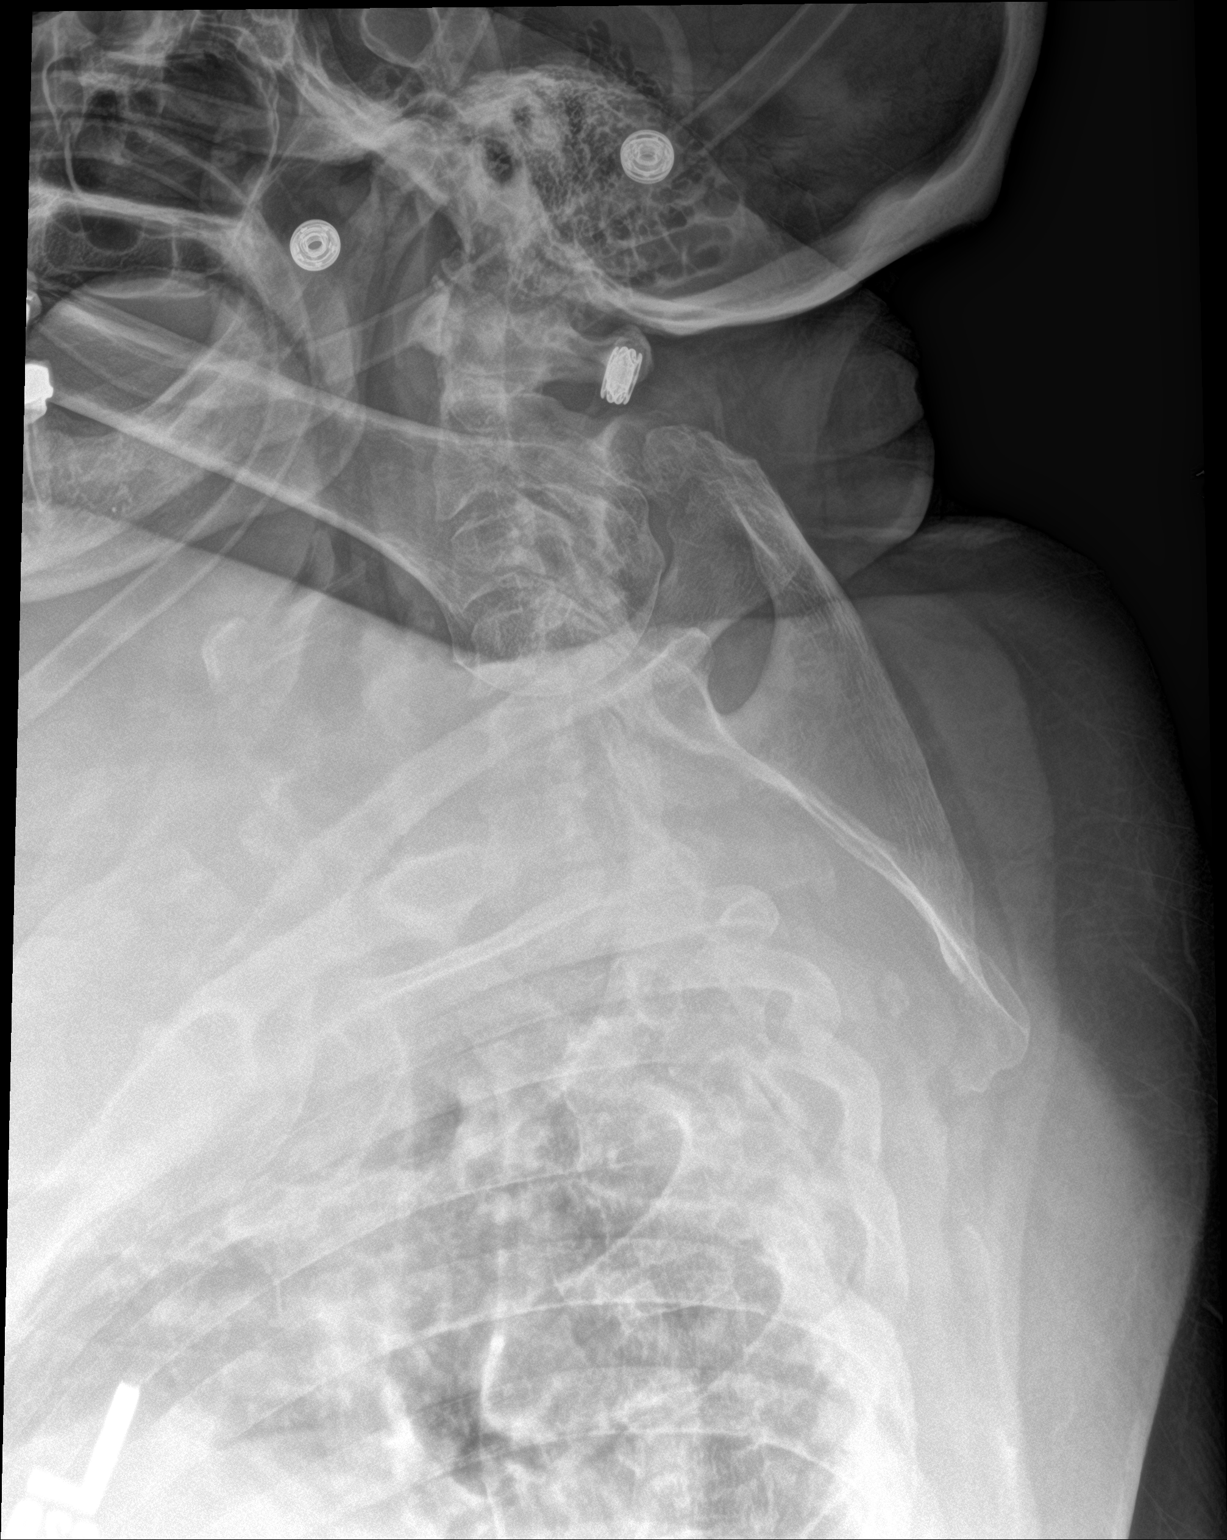

[3 of 3 positions shown; findings below may reference images not displayed]

FINDINGS: Vertebral body heights and alignment are maintained. Mild
degenerative changes are present. Included lungs are clear. Normal
heart size.
IMPRESSION: Mild degenerative changes.  No compression deformity identified.

## 2021-10-30 NOTE — Therapy (Signed)
OUTPATIENT PHYSICAL THERAPY LOWER EXTREMITY TREATMENT   Patient Name: Jasmine Morrow MRN: [id number] DOB:July 05, 1945, 76 y.o., female Today's Date: 10/28/2021   PT End of Session - 10/30/21 1917     Visit Number 12    Number of Visits 24    Date for PT Re-Evaluation 12/02/21    PT Start Time 8469    PT Stop Time 1516    PT Time Calculation (min) 56 min    Activity Tolerance Patient tolerated treatment well             Past Medical History:  Diagnosis Date   Anemia    Anxiety    Asthma    seasonal    GERD (gastroesophageal reflux disease)    Hypertension    PONV (postoperative nausea and vomiting)    Seasonal allergies    Sleep apnea    does not uses CPAP machine anymore    Past Surgical History:  Procedure Laterality Date   APPENDECTOMY     CESAREAN SECTION     CHOLECYSTECTOMY     DILATION AND CURETTAGE OF UTERUS     ESOPHAGOGASTRODUODENOSCOPY N/A 02/22/2019   Procedure: ESOPHAGOGASTRODUODENOSCOPY (EGD);  Surgeon: Toledo, Benay Pike, MD;  Location: [facility];  Service: Gastroenterology;  Laterality: N/A;   FLEXIBLE SIGMOIDOSCOPY N/A 02/22/2019   Procedure: FLEXIBLE SIGMOIDOSCOPY;  Surgeon: Toledo, Benay Pike, MD;  Location: [facility];  Service: Gastroenterology;  Laterality: N/A;   HARDWARE REMOVAL Right 11/12/2019   Procedure: Right ankle hardware removal;  Surgeon: Hessie Knows, MD;  Location: [facility];  Service: Orthopedics;  Laterality: Right;   HARDWARE REMOVAL Right 04/17/2020   Procedure: HARDWARE REMOVAL FROM RIGHT ANKLE AND RIGHT KNEE; IMPLANT OF CEMENT SPACERS IN RIGHT KNEE;  Surgeon: Dereck Leep, MD;  Location: [facility];  Service: Orthopedics;  Laterality: Right;   JOINT REPLACEMENT     ORIF ANKLE FRACTURE Right 03/14/2019   Procedure: OPEN REDUCTION INTERNAL FIXATION (ORIF) ANKLE FRACTURE, MEDIAL MALLEOLUS;  Surgeon: Hessie Knows, MD;  Location: [facility];  Service: Orthopedics;  Laterality: Right;   ORIF ANKLE FRACTURE Right 02/08/2019    Procedure: OPEN REDUCTION INTERNAL FIXATION (ORIF) ANKLE FRACTURE, post malleolus;  Surgeon: Hessie Knows, MD;  Location: [facility];  Service: Orthopedics;  Laterality: Right;   REPLACEMENT TOTAL KNEE Bilateral 2008   2009   SCAR DEBRIDEMENT OF TOTAL KNEE  01/2020   SYNDESMOSIS REPAIR Right 02/08/2019   Procedure: SYNDESMOSIS REPAIR;  Surgeon: Hessie Knows, MD;  Location: [facility];  Service: Orthopedics;  Laterality: Right;   TEE WITHOUT CARDIOVERSION N/A 02/26/2019   Procedure: TRANSESOPHAGEAL ECHOCARDIOGRAM (TEE);  Surgeon: Corey Skains, MD;  Location: [facility];  Service: Cardiovascular;  Laterality: N/A;   TOTAL KNEE REVISION WITH SCAR DEBRIDEMENT/PATELLA REVISION WITH POLY EXCHANGE Right 01/24/2020   Procedure: TOTAL KNEE REVISION WITH SCAR DEBRIDEMENT/PATELLA REVISION WITH POLY EXCHANGE;  Surgeon: Dereck Leep, MD;  Location: [facility];  Service: Orthopedics;  Laterality: Right;   TUMOR REMOVAL     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   Patient Active Problem List   Diagnosis Date Noted   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[place]) 04/17/2020   Anxiety 04/17/2020   History of GI diverticular bleed 04/17/2020   Chest pain 04/17/2020   PAD (peripheral artery disease) ([place]) 04/05/2020   Ulcer of right leg ([place]) 04/05/2020   COPD (chronic obstructive pulmonary disease) ([place]) 04/05/2020   GERD (gastroesophageal reflux disease) 04/05/2020   Abscess of right knee 01/22/2020   Pressure injury of skin 02/28/2019  SOB (shortness of breath)    Gastrointestinal hemorrhage    Sepsis (HCC)    Symptomatic anemia 02/20/2019   Trimalleolar fracture of ankle, closed, right, initial encounter 02/07/2019   Multiple lung nodules 03/31/2014   Extrinsic asthma 03/27/2014   OSA on CPAP 03/27/2014    PCP: Miller, Mark F, MD  REFERRING PROVIDER: Miller, Mark F, MD  REFERRING DIAG: Above knee amputation of right lower extremity  THERAPY DIAG:  S/P AKA (above knee amputation) unilateral, right  (HCC)  Muscle weakness (generalized)  Gait difficulty  Abnormal posture  Rationale for Evaluation and Treatment Rehabilitation  ONSET DATE: 12/02/20  SUBJECTIVE:  EVALUATION  PERTINENT HISTORY: Patient Profile:   Jasmine Morrow is a 76 y.o. female Chief Complaint  Patient presents with  Visit Follow Up  Doing well.   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 Cardiac cath 10/2011 showed completely normal coronary arteries, however she did have mild left ventricular dysfunction with anterior wall motion abnormality. Left ventricular EF was 51%.  History of chickenpox  Hyperlipidemia  patient states she has never been diagnosed with this  Hypertension  Knee joint replacement by other means  Macular degeneration  Major depressive disorder, recurrent, mild (CMS-HCC)  Medicare annual wellness visit, initial 08/09/2019  7/21  Multiple thyroid nodules  Normal coronary arteries 09/06/2013  Normal coronary anatomy by cardiac catheterization 10/12/11  Osteoarthritis  Ovarian cyst  PONV (postoperative nausea and vomiting)  nausea  Postoperative urinary retention 12/08/2020  Rash on lips 12/21/2020  Seasonal allergies  Sinusitis, unspecified  Sleep apnea  On CPAP.  Slow transit constipation 12/10/2020  Trimalleolar fracture of ankle, closed, right, initial encounter 02/07/2019  Complicated by  staph infection 1/21, 4 weeks IV Ancef, Menz  Ulcer   Past Surgical History:  Procedure Laterality Date  SALPINGO OOPHORECTOMY Bilateral 1993  Right total knee arthroplasty 10/11/2006  Dr Hooten  Left total knee arthroplasty 10/12/2007  Dr Hooten  COLONOSCOPY 07/30/2012  internal hemorrhoids, diverticulosis  ANKLE ARTHODESIS W/ ARTHROSCOPY Right 02/08/2019  03/14/2019 second surgery  COLONOSCOPY 02/22/2019  Blood in entire colon/Diverticulosis - Presumed diverticular bleed. No repeat recommended per TKT.  EGD 02/22/2019  Gastritis/gastric ulcers/Hiatal hernia/Otherwise normal - no repeat recommended per TKT.  ORIF ANKLE FRACTURE Right 03/14/2019  Dr. Menz  REMOVAL HARDWARE ANKLE FOOT/TOES Right 11/12/2019  Dr. Menz  Right knee arthrotomy, irrigation and debridement of the right knee, polyethylene exchange 01/24/2020  Dr Hooten  Removal of hardware (plates and screws) from the right ankle with irrigation, debridement, and placement of Stimulan antibiotic beads 04/17/2020  Dr Hooten  Right knee arthrotomy, extensive irrigation debridement, removal of right total knee implants, and placement of antibiotic impregnated polymethylmethacrylate cement spacer 04/17/2020  Dr Hooten  AMPUTATION LEG ABOVE KNEE AKA Right 12/02/2020  Procedure: AMPUTATION, THIGH, THROUGH FEMUR, ANY LEVEL; Surgeon: Chun, Danielle Sunae, MD; Location: [facility]; Service: Orthopedics; Laterality: Right;  TRANSFER ADJACENT TISSUE LEG Right 12/02/2020  Procedure: ADJACENT TISSUE TRANSFER OR REARRANGEMENT, LEG;   DEFECT 10 SQ CM OR LESS; Surgeon: Chun, Danielle Sunae, MD; Location: [facility]; Service: Orthopedics; Laterality: Right;  ASPIRATION/INJECTION MAJOR JOINT/BURSA KNEE Left 12/02/2020  Procedure: ARTHROCENTESIS, ASPIRATION AND/OR INJECTION, MAJOR JOINT OR BURSA, KNEE; WITHOUT ULTRASOUND GUIDANCE; Surgeon: Chun, Danielle Sunae, MD; Location: [facility]; Service: Orthopedics; Laterality: Left;  REMOVAL  KNEE PROSTHESIS W/POSSIBLE PLACEMENT SPACER Left 01/18/2021  Procedure: REMOVAL OF PROSTHESIS, INCLUDING TOTAL KNEE PROSTHESIS, METHYLMETHACRYLATE WITH OR WITHOUT INSERTION OF SPACER, KNEE; Surgeon: Jiranek, William Arthur, MD; Location: [facility]; Service: Orthopedics; Laterality: Left;  INSERTION NON-BIODEGRADABLE DRUG DELIVERY IMPLANT Left 01/18/2021  Procedure: INSERTION, KNEE, bioresorbable, biodegradable, NON-BIODEGRADABLE DRUG DELIVERY IMPLANT; Surgeon: Jiranek, William Arthur, MD; Location: [facility]; Service: Orthopedics; Laterality: Left;  ABOVE KNEE LEG AMPUTATION  12/02/2020  APPENDECTOMY Est 1968  CESAREAN SECTION  CHOLECYSTECTOMY  FRACTURE SURGERY 1/21  HYSTERECTOMY partial  JOINT REPLACEMENT 2008 & 2009  TUBAL LIGATION 3/79   ALLERGIES: Allergies  Allergen Reactions  Singulair [Montelukast] Other (See Comments)  Gland swelling  Sulfa (Sulfonamide Antibiotics) Swelling  Oxycodone Dizziness and Other (See Comments)  Vancomycin Analogues Other (See Comments)  Ringing in ears  Avinza [Morphine] Itching  Darvocet A500 [Propoxyphene N-Acetaminophen] Nausea  Nickel Rash  Ultracet [Tramadol-Acetaminophen] Rash  Vicodin [Hydrocodone-Acetaminophen] Vomiting  Vioxx [Rofecoxib] Other (See Comments)  GI   CURRENT MEDICATIONS: Current Outpatient Medications: acetaminophen (TYLENOL) 500 MG tablet, Take 1,000 mg by mouth 3 (three) times a day Twice daily per patient., PRN Not Currently Taking aspirin 81 MG EC tablet, Take 1 tablet (81 mg total) by mouth at bedtime, Taking calcium carbonate-vitamin D3 (CALTRATE 600+D) 600 mg-10 mcg (400 unit) tablet, Take 0.5 tablets by mouth 2 (two) times daily, Taking cetirizine (ZYRTEC) 10 mg capsule, Take 1 capsule (10 mg total) by mouth once daily, Taking escitalopram oxalate (LEXAPRO) 10 MG tablet, Take 1 tablet (10 mg total) by mouth once daily, Taking estradioL (ESTRACE) 0.01 % (0.1 mg/gram) vaginal cream, Place 2 g vaginally twice a  week, Taking folic acid/multivit,iron,miner (MULTIVIT-IRON-MIN-FOLIC ACID ORAL), Take 1 tablet by mouth once daily, Taking gabapentin (NEURONTIN) 300 MG capsule, Take 1 capsule (300 mg total) by mouth 3 (three) times daily Phantom limb pain, Taking melatonin 3 mg tablet, Take 1 tablet (3 mg total) by mouth at bedtime, Taking multivitamin with iron (COMPLETE MULTIVITAMIN-MINERAL) tablet, Take 1 tablet by mouth, Taking multivitamin with minerals, EYE, (PRESERVISION AREDS 2) soft gel capsule, Take 1 capsule by mouth 2 (two) times daily, Taking nystatin (MYCOSTATIN) 100,000 unit/gram powder, Apply small amount topically twice daily for rash under breasts, PRN Not Currently Taking pantoprazole (PROTONIX) 40 MG DR tablet, Take 1 tablet (40 mg total) by mouth 2 (two) times daily. Please call to schedule an appt. Thanks!, Taking polyethylene glycol (MIRALAX) powder, Take 17 g by mouth once daily Mix in 4-8ounces of fluid prior to taking., Taking sennosides-docusate (SENOKOT-S) 8.6-50 mg tablet, Take 1 tablet by mouth 2 (two) times daily For constipation, Taking lactose-reduced food (ENSURE PLUS ORAL), Take 237 mLs by mouth 2 (two) times daily With breakfast and dinner  HPI   CLINICAL SUMMARY:  Patient post right AKA and working with prosthesis. 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 Able to do housework, improving  PAIN:  Are you having pain? Yes: NPRS scale: 5/10 Pain location: L knee Pain description: aching Aggravating factors: walking Relieving factors: rest  PRECAUTIONS: None  WEIGHT BEARING RESTRICTIONS No  FALLS:  Has patient fallen in last 6 months? No  LIVING ENVIRONMENT:   Lives with: lives with their spouse Lives in: House/apartment Stairs: No Has following equipment at home: Gilford Rile - 4 wheeled and Wheelchair (manual)  OCCUPATION: retired  PLOF: Requires assistive device for independence  PATIENT GOALS  improve standing tolerance/ pull up  pants/ ambulate with [place] safely.     OBJECTIVE:   PATIENT SURVEYS:  FOTO initial 46/ goal 18.    COGNITION:  Overall cognitive status: Within functional limits for tasks assessed     SENSATION: WFL  POSTURE: rounded shoulders, forward head, flexed trunk , and weight shift left  PALPATION: No tenderness along R distal residual limb.  Discussed phantom limb sensation.    LOWER EXTREMITY ROM:  B LE AROM WFL except L knee extension secondary to articulating spacer.  Did not assess L/R hip extension at this time.    LOWER EXTREMITY MMT:  MMT Right eval Left eval  Hip flexion 4/5 4/5  Hip extension    Hip abduction 4+/5 4+/5  Hip adduction 4+/5 4+/5  Hip internal rotation    Hip external rotation    Knee flexion N/A 4+/5  Knee extension N/A 4+/5  Ankle dorsiflexion  5/5  Ankle plantarflexion    Ankle inversion    Ankle eversion     (Blank rows = not tested)  GAIT: Distance walked: in gym/ //-bars Assistive device utilized: Environmental consultant - 2 wheeled Level of assistance: CGA Comments: Cuing for posture correction with mirror feedback.  Flexed posture/ heavy UE assist.     TODAY'S TREATMENT:  10/28/21  Subjective:  Pt. Reports moderate L knee soreness/ discomfort after last tx. Session.  Soreness due to walking with less UE assist and attempting forearm crutch in //-bars.     Objective:    There.ex.:    Nustep L4 10 min. B UE/LE (seat #[id number]).  Consistent cadence for muscle endurance.    Seated marching/ hip abduction in chair.  Lateral walking in //-bars with posture correction 3 laps.     Seated 2# dumbbells for UE:  shoulder flexion/ chest press/ bicep curls 20x each.    Gait training:   Ambulate in clinic (160 feet) with RW and CGA with cuing to correct head position/ upright posture.  No LOB and consistent R hip flexion/ step length.    Walking in //-bars forward/ backwards (3 laps) with B UE and CGA for safety/ verbal cuing.   No issues with backwards walking.   6" step ups/ down in //-bars with focus on wt. Shift/ upright posture.   Amb. From Nust[place] to car with RW.   No LOB and good BOS but moderate cuing to correct posture.          PATIENT EDUCATION:  Education details: HEP/ gait and prosthetic training.  Person educated: Patient and Spouse Education method: Explanation, Demonstration, and Verbal cues Education comprehension: verbalized understanding, returned demonstration, and tactile cues required   HOME EXERCISE PROGRAM: Prone position hip stretches/ Standing marching at kitchen counter with w/c behind patient.      ASSESSMENT:  CLINICAL IMPRESSION: Pt. ambulates around PT clinic with use of prosthetic leg/ RW with consistent recip.   Good R knee mechanism control during gait/ standing wt. Shifting in clinic.  No single UE walking today secondary to fatigue/ L knee discomfort.  Pt. Becoming more mod. Independent with gait but still limited at home due to no one to assist pt.   Pt. Will benefit from skilled PT services to improve standing tolerance/ gait independence and ADL.    OBJECTIVE IMPAIRMENTS  Abnormal gait, decreased activity tolerance, decreased balance, decreased endurance, decreased mobility, difficulty walking, decreased ROM, decreased strength, decreased safety awareness, impaired flexibility, improper body mechanics, prosthetic dependency , and pain.   ACTIVITY LIMITATIONS carrying, lifting, standing, squatting, stairs, transfers, bed mobility, bathing, toileting, dressing, hygiene/grooming, and locomotion level  PARTICIPATION LIMITATIONS: cleaning, laundry, driving, shopping, community activity, and yard work  [place] Past/current experiences are also affecting patient's functional outcome.   REHAB POTENTIAL: Good  CLINICAL DECISION MAKING: Evolving/moderate complexity  EVALUATION COMPLEXITY: High   GOALS: Goals reviewed with patient? Yes  SHORT TERM GOALS: Target date: 10/07/21 Pt. Able to don/doff R  prosthetic leg independently to improve ADL/standing tolerance.  Baseline:  min. A to don/ doff prosthetic leg Goal status: Goal met   LONG TERM GOALS: Target date: 12/02/21  Pt. Will increase FOTO to 54 to improve functional mobility.  Baseline: initial FOTO 46 Goal status: On-going  2.  Pt. Able to tolerate standing 10 minutes while pulling up pants with mod. I safely while wearing prosthetic leg to improve daily activities.  Baseline:  limited standing tolerance Goal status: INITIAL  3.  Pt. Will ambulate 200 feet with use of SPC and mod. I to promote safety with household tasks/ walking into bathroom.   Baseline:  amb. With RW and min. A for safety.   Goal status: INITIAL   PLAN: PT FREQUENCY: 2x/week  PT DURATION: 12 weeks  PLANNED INTERVENTIONS: Therapeutic exercises, Therapeutic activity, Neuromuscular re-education, Balance training, Gait training, Patient/Family education, Self Care, Joint mobilization, Prosthetic training, DME instructions, and Manual therapy  PLAN FOR NEXT SESSION:  Issue info on driving with Caren Griffins Passenger transport manager).    Pura Spice, PT, DPT # [phone] 10/30/2021, 7:20 PM

## 2021-10-30 NOTE — Therapy (Signed)
OUTPATIENT PHYSICAL THERAPY LOWER EXTREMITY TREATMENT   Patient Name: Jasmine Morrow MRN: [id number] DOB:[birth year], 76 y.o., female Today's Date: 10/26/2021   PT End of Session - 10/30/21 1709     Visit Number 11    Number of Visits 24    Date for PT Re-Evaluation 12/02/21    PT Start Time 1424    PT Stop Time 1516    PT Time Calculation (min) 52 min    Activity Tolerance Patient tolerated treatment well             Past Medical History:  Diagnosis Date   Anemia    Anxiety    Asthma    seasonal    GERD (gastroesophageal reflux disease)    Hypertension    PONV (postoperative nausea and vomiting)    Seasonal allergies    Sleep apnea    does not uses CPAP machine anymore    Past Surgical History:  Procedure Laterality Date   APPENDECTOMY     CESAREAN SECTION     CHOLECYSTECTOMY     DILATION AND CURETTAGE OF UTERUS     ESOPHAGOGASTRODUODENOSCOPY N/A 02/22/2019   Procedure: ESOPHAGOGASTRODUODENOSCOPY (EGD);  Surgeon: Toledo, Benay Pike, MD;  Location: [facility];  Service: Gastroenterology;  Laterality: N/A;   FLEXIBLE SIGMOIDOSCOPY N/A 02/22/2019   Procedure: FLEXIBLE SIGMOIDOSCOPY;  Surgeon: Toledo, Benay Pike, MD;  Location: [facility];  Service: Gastroenterology;  Laterality: N/A;   HARDWARE REMOVAL Right 11/12/2019   Procedure: Right ankle hardware removal;  Surgeon: Jasmine Knows, MD;  Location: [facility];  Service: Orthopedics;  Laterality: Right;   HARDWARE REMOVAL Right 04/17/2020   Procedure: HARDWARE REMOVAL FROM RIGHT ANKLE AND RIGHT KNEE; IMPLANT OF CEMENT SPACERS IN RIGHT KNEE;  Surgeon: Jasmine Leep, MD;  Location: [facility];  Service: Orthopedics;  Laterality: Right;   JOINT REPLACEMENT     ORIF ANKLE FRACTURE Right 03/14/2019   Procedure: OPEN REDUCTION INTERNAL FIXATION (ORIF) ANKLE FRACTURE, MEDIAL MALLEOLUS;  Surgeon: Jasmine Knows, MD;  Location: [facility];  Service: Orthopedics;  Laterality: Right;   ORIF ANKLE FRACTURE Right 02/08/2019    Procedure: OPEN REDUCTION INTERNAL FIXATION (ORIF) ANKLE FRACTURE, post malleolus;  Surgeon: Jasmine Knows, MD;  Location: [facility];  Service: Orthopedics;  Laterality: Right;   REPLACEMENT TOTAL KNEE Bilateral 2008   2009   SCAR DEBRIDEMENT OF TOTAL KNEE  01/2020   SYNDESMOSIS REPAIR Right 02/08/2019   Procedure: SYNDESMOSIS REPAIR;  Surgeon: Jasmine Knows, MD;  Location: [facility];  Service: Orthopedics;  Laterality: Right;   TEE WITHOUT CARDIOVERSION N/A 02/26/2019   Procedure: TRANSESOPHAGEAL ECHOCARDIOGRAM (TEE);  Surgeon: Jasmine Skains, MD;  Location: [facility];  Service: Cardiovascular;  Laterality: N/A;   TOTAL KNEE REVISION WITH SCAR DEBRIDEMENT/PATELLA REVISION WITH POLY EXCHANGE Right 01/24/2020   Procedure: TOTAL KNEE REVISION WITH SCAR DEBRIDEMENT/PATELLA REVISION WITH POLY EXCHANGE;  Surgeon: Jasmine Leep, MD;  Location: [facility];  Service: Orthopedics;  Laterality: Right;   TUMOR REMOVAL     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   Patient Active Problem List   Diagnosis Date Noted   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[place]) 04/17/2020   Anxiety 04/17/2020   History of GI diverticular bleed 04/17/2020   Chest pain 04/17/2020   PAD (peripheral artery disease) ([place]) 04/05/2020   Ulcer of right leg ([place]) 04/05/2020   COPD (chronic obstructive pulmonary disease) ([place]) 04/05/2020   GERD (gastroesophageal reflux disease) 04/05/2020   Abscess of right knee 01/22/2020   Pressure injury of skin 02/28/2019  SOB (shortness of breath)    Gastrointestinal hemorrhage    Sepsis (HCC)    Symptomatic anemia 02/20/2019   Trimalleolar fracture of ankle, closed, right, initial encounter 02/07/2019   Multiple lung nodules 03/31/2014   Extrinsic asthma 03/27/2014   OSA on CPAP 03/27/2014    PCP: Miller, Mark F, MD  REFERRING PROVIDER: Miller, Mark F, MD  REFERRING DIAG: Above knee amputation of right lower extremity  THERAPY DIAG:  S/P AKA (above knee amputation) unilateral, right  (HCC)  Muscle weakness (generalized)  Gait difficulty  Abnormal posture  Rationale for Evaluation and Treatment Rehabilitation  ONSET DATE: 12/02/20  SUBJECTIVE:  EVALUATION  PERTINENT HISTORY: Patient Profile:   Jasmine Morrow is a 76 y.o. female Chief Complaint  Patient presents with  Visit Follow Up  Doing well.   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 Cardiac cath 10/2011 showed completely normal coronary arteries, however she did have mild left ventricular dysfunction with anterior wall motion abnormality. Left ventricular EF was 51%.  History of chickenpox  Hyperlipidemia  patient states she has never been diagnosed with this  Hypertension  Knee joint replacement by other means  Macular degeneration  Major depressive disorder, recurrent, mild (CMS-HCC)  Medicare annual wellness visit, initial 08/09/2019  7/21  Multiple thyroid nodules  Normal coronary arteries 09/06/2013  Normal coronary anatomy by cardiac catheterization 10/12/11  Osteoarthritis  Ovarian cyst  PONV (postoperative nausea and vomiting)  nausea  Postoperative urinary retention 12/08/2020  Rash on lips 12/21/2020  Seasonal allergies  Sinusitis, unspecified  Sleep apnea  On CPAP.  Slow transit constipation 12/10/2020  Trimalleolar fracture of ankle, closed, right, initial encounter 02/07/2019  Complicated by  staph infection 1/21, 4 weeks IV Ancef, Menz  Ulcer   Past Surgical History:  Procedure Laterality Date  SALPINGO OOPHORECTOMY Bilateral 1993  Right total knee arthroplasty 10/11/2006  Dr Hooten  Left total knee arthroplasty 10/12/2007  Dr Hooten  COLONOSCOPY 07/30/2012  internal hemorrhoids, diverticulosis  ANKLE ARTHODESIS W/ ARTHROSCOPY Right 02/08/2019  03/14/2019 second surgery  COLONOSCOPY 02/22/2019  Blood in entire colon/Diverticulosis - Presumed diverticular bleed. No repeat recommended per TKT.  EGD 02/22/2019  Gastritis/gastric ulcers/Hiatal hernia/Otherwise normal - no repeat recommended per TKT.  ORIF ANKLE FRACTURE Right 03/14/2019  Dr. Menz  REMOVAL HARDWARE ANKLE FOOT/TOES Right 11/12/2019  Dr. Menz  Right knee arthrotomy, irrigation and debridement of the right knee, polyethylene exchange 01/24/2020  Dr Hooten  Removal of hardware (plates and screws) from the right ankle with irrigation, debridement, and placement of Stimulan antibiotic beads 04/17/2020  Dr Hooten  Right knee arthrotomy, extensive irrigation debridement, removal of right total knee implants, and placement of antibiotic impregnated polymethylmethacrylate cement spacer 04/17/2020  Dr Hooten  AMPUTATION LEG ABOVE KNEE AKA Right 12/02/2020  Procedure: AMPUTATION, THIGH, THROUGH FEMUR, ANY LEVEL; Surgeon: Chun, Danielle Sunae, MD; Location: [facility]; Service: Orthopedics; Laterality: Right;  TRANSFER ADJACENT TISSUE LEG Right 12/02/2020  Procedure: ADJACENT TISSUE TRANSFER OR REARRANGEMENT, LEG;   DEFECT 10 SQ CM OR LESS; Surgeon: Chun, Danielle Sunae, MD; Location: [facility]; Service: Orthopedics; Laterality: Right;  ASPIRATION/INJECTION MAJOR JOINT/BURSA KNEE Left 12/02/2020  Procedure: ARTHROCENTESIS, ASPIRATION AND/OR INJECTION, MAJOR JOINT OR BURSA, KNEE; WITHOUT ULTRASOUND GUIDANCE; Surgeon: Chun, Danielle Sunae, MD; Location: [facility]; Service: Orthopedics; Laterality: Left;  REMOVAL  KNEE PROSTHESIS W/POSSIBLE PLACEMENT SPACER Left 01/18/2021  Procedure: REMOVAL OF PROSTHESIS, INCLUDING TOTAL KNEE PROSTHESIS, METHYLMETHACRYLATE WITH OR WITHOUT INSERTION OF SPACER, KNEE; Surgeon: Jiranek, William Arthur, MD; Location: [facility]; Service: Orthopedics; Laterality: Left;  INSERTION NON-BIODEGRADABLE DRUG DELIVERY IMPLANT Left 01/18/2021  Procedure: INSERTION, KNEE, bioresorbable, biodegradable, NON-BIODEGRADABLE DRUG DELIVERY IMPLANT; Surgeon: Jiranek, William Arthur, MD; Location: [facility]; Service: Orthopedics; Laterality: Left;  ABOVE KNEE LEG AMPUTATION  12/02/2020  APPENDECTOMY Est 1968  CESAREAN SECTION  CHOLECYSTECTOMY  FRACTURE SURGERY 1/21  HYSTERECTOMY partial  JOINT REPLACEMENT 2008 & 2009  TUBAL LIGATION 3/79   ALLERGIES: Allergies  Allergen Reactions  Singulair [Montelukast] Other (See Comments)  Gland swelling  Sulfa (Sulfonamide Antibiotics) Swelling  Oxycodone Dizziness and Other (See Comments)  Vancomycin Analogues Other (See Comments)  Ringing in ears  Avinza [Morphine] Itching  Darvocet A500 [Propoxyphene N-Acetaminophen] Nausea  Nickel Rash  Ultracet [Tramadol-Acetaminophen] Rash  Vicodin [Hydrocodone-Acetaminophen] Vomiting  Vioxx [Rofecoxib] Other (See Comments)  GI   CURRENT MEDICATIONS: Current Outpatient Medications: acetaminophen (TYLENOL) 500 MG tablet, Take 1,000 mg by mouth 3 (three) times a day Twice daily per patient., PRN Not Currently Taking aspirin 81 MG EC tablet, Take 1 tablet (81 mg total) by mouth at bedtime, Taking calcium carbonate-vitamin D3 (CALTRATE 600+D) 600 mg-10 mcg (400 unit) tablet, Take 0.5 tablets by mouth 2 (two) times daily, Taking cetirizine (ZYRTEC) 10 mg capsule, Take 1 capsule (10 mg total) by mouth once daily, Taking escitalopram oxalate (LEXAPRO) 10 MG tablet, Take 1 tablet (10 mg total) by mouth once daily, Taking estradioL (ESTRACE) 0.01 % (0.1 mg/gram) vaginal cream, Place 2 g vaginally twice a  week, Taking folic acid/multivit,iron,miner (MULTIVIT-IRON-MIN-FOLIC ACID ORAL), Take 1 tablet by mouth once daily, Taking gabapentin (NEURONTIN) 300 MG capsule, Take 1 capsule (300 mg total) by mouth 3 (three) times daily Phantom limb pain, Taking melatonin 3 mg tablet, Take 1 tablet (3 mg total) by mouth at bedtime, Taking multivitamin with iron (COMPLETE MULTIVITAMIN-MINERAL) tablet, Take 1 tablet by mouth, Taking multivitamin with minerals, EYE, (PRESERVISION AREDS 2) soft gel capsule, Take 1 capsule by mouth 2 (two) times daily, Taking nystatin (MYCOSTATIN) 100,000 unit/gram powder, Apply small amount topically twice daily for rash under breasts, PRN Not Currently Taking pantoprazole (PROTONIX) 40 MG DR tablet, Take 1 tablet (40 mg total) by mouth 2 (two) times daily. Please call to schedule an appt. Thanks!, Taking polyethylene glycol (MIRALAX) powder, Take 17 g by mouth once daily Mix in 4-8ounces of fluid prior to taking., Taking sennosides-docusate (SENOKOT-S) 8.6-50 mg tablet, Take 1 tablet by mouth 2 (two) times daily For constipation, Taking lactose-reduced food (ENSURE PLUS ORAL), Take 237 mLs by mouth 2 (two) times daily With breakfast and dinner  HPI   CLINICAL SUMMARY:  Patient post right AKA and working with prosthesis. 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 Able to do housework, improving  PAIN:  Are you having pain? Yes: NPRS scale: 5/10 Pain location: L knee Pain description: aching Aggravating factors: walking Relieving factors: rest  PRECAUTIONS: None  WEIGHT BEARING RESTRICTIONS No  FALLS:  Has patient fallen in last 6 months? No  LIVING ENVIRONMENT:   Lives with: lives with their spouse Lives in: House/apartment Stairs: No Has following equipment at home: Gilford Rile - 4 wheeled and Wheelchair (manual)  OCCUPATION: retired  PLOF: Requires assistive device for independence  PATIENT GOALS  improve standing tolerance/ pull up  pants/ ambulate with [place] safely.     OBJECTIVE:   PATIENT SURVEYS:  FOTO initial 46/ goal 20.    COGNITION:  Overall cognitive status: Within functional limits for tasks assessed     SENSATION: WFL  POSTURE: rounded shoulders, forward head, flexed trunk , and weight shift left  PALPATION: No tenderness along R distal residual limb.  Discussed phantom limb sensation.    LOWER EXTREMITY ROM:  B LE AROM WFL except L knee extension secondary to articulating spacer.  Did not assess L/R hip extension at this time.    LOWER EXTREMITY MMT:  MMT Right eval Left eval  Hip flexion 4/5 4/5  Hip extension    Hip abduction 4+/5 4+/5  Hip adduction 4+/5 4+/5  Hip internal rotation    Hip external rotation    Knee flexion N/A 4+/5  Knee extension N/A 4+/5  Ankle dorsiflexion  5/5  Ankle plantarflexion    Ankle inversion    Ankle eversion     (Blank rows = not tested)  GAIT: Distance walked: in gym/ //-bars Assistive device utilized: Environmental consultant - 2 wheeled Level of assistance: CGA Comments: Cuing for posture correction with mirror feedback.  Flexed posture/ heavy UE assist.     TODAY'S TREATMENT:  10/26/21  Subjective:  Pt. Reports no pain or new complaints.  Pt. Entered PT in w/c and donned prosthetic leg with 5 ply sock.  Pt. Concerned she is not progressing as fast as she should.  PT reassured pt. That she has a long road and is doing well with short distance ambulation with use of RW.  Pt. Interested in trying to forearm crutches today.     Objective:    There.ex.:    Nustep L3 10 min. B UE/LE (seat #12).  Consistent cadence for muscle endurance.    Seated marching/ hip abduction in green chair.  Lateral walking in //-bars with posture correction 3 laps.  Discussed HEP/ use of prosthetic leg and walking at home.    Gait training:   Ambulate from blue mat table to //-bars with RW and CGA with cuing to correct head position/ upright posture.    Walking in //-bars (3 laps)  with use of L UE only and mod. A for safety/ verbal cuing.  Pt. Highly motivated and driven to improve walking independence/ endurance.   Amb. In //-bar working on decrease R UE assist on //-bars.  Difficulty with R hip flexion without use of B UE support on //-bars.  Amb. From //-bars.  Attempted [place] crutch on L UE with 3 -point gait pattern/ mod. A from PT.  Difficulty with R hip flexion/ swing through.    Amb. From Nustep to front of clinic and then sitting in w/c secondary to fatigue.   No LOB and good BOS but moderate cuing to correct posture.          PATIENT EDUCATION:  Education details: HEP/ gait and prosthetic training.  Person educated: Patient and Spouse Education method: Explanation, Demonstration, and Verbal cues Education comprehension: verbalized understanding, returned demonstration, and tactile cues required   HOME EXERCISE PROGRAM: Prone position hip stretches/ Standing marching at kitchen counter with w/c behind patient.      ASSESSMENT:  CLINICAL IMPRESSION: Pt. ambulates  around PT clinic with use of prosthetic leg/ RW with consistent recip.   Good R knee mechanism control during gait/ standing wt. Shifting in clinic.  PT working on standing wt. Shifting and ambulating with decrease UE support in //-bars. Pt. Has difficulty with R hip flexion/ swing through of prosthetic leg with use of only L UE on //-bars or forearm crutch.  Pt. Continues to require B UE assist, esp. With swing through phase of R LE.   Pt. Will benefit from skilled PT services to improve standing tolerance/ gait independence and ADL.    OBJECTIVE IMPAIRMENTS Abnormal gait, decreased activity tolerance, decreased balance, decreased endurance, decreased mobility, difficulty walking, decreased ROM, decreased strength, decreased safety awareness, impaired flexibility, improper body mechanics, prosthetic dependency , and pain.   ACTIVITY LIMITATIONS carrying, lifting, standing, squatting, stairs,  transfers, bed mobility, bathing, toileting, dressing, hygiene/grooming, and locomotion level  PARTICIPATION LIMITATIONS: cleaning, laundry, driving, shopping, community activity, and yard work  Miguel Barrera Past/current experiences are also affecting patient's functional outcome.   REHAB POTENTIAL: Good  CLINICAL DECISION MAKING: Evolving/moderate complexity  EVALUATION COMPLEXITY: High   GOALS: Goals reviewed with patient? Yes  SHORT TERM GOALS: Target date: 10/07/21 Pt. Able to don/doff R prosthetic leg independently to improve ADL/standing tolerance.  Baseline:  min. A to don/ doff prosthetic leg Goal status: Goal met   LONG TERM GOALS: Target date: 12/02/21  Pt. Will increase FOTO to 54 to improve functional mobility.  Baseline: initial FOTO 46 Goal status: On-going  2.  Pt. Able to tolerate standing 10 minutes while pulling up pants with mod. I safely while wearing prosthetic leg to improve daily activities.  Baseline:  limited standing tolerance Goal status: INITIAL  3.  Pt. Will ambulate 200 feet with use of SPC and mod. I to promote safety with household tasks/ walking into bathroom.   Baseline:  amb. With RW and min. A for safety.   Goal status: INITIAL   PLAN: PT FREQUENCY: 2x/week  PT DURATION: 12 weeks  PLANNED INTERVENTIONS: Therapeutic exercises, Therapeutic activity, Neuromuscular re-education, Balance training, Gait training, Patient/Family education, Self Care, Joint mobilization, Prosthetic training, DME instructions, and Manual therapy  PLAN FOR NEXT SESSION:  Issue info on driving with Caren Griffins Passenger transport manager).    Pura Spice, PT, DPT # [phone] 10/30/2021, 5:11 PM

## 2021-11-03 ENCOUNTER — Ambulatory Visit: Payer: Medicare Other | Attending: Internal Medicine | Admitting: Physical Therapy

## 2021-11-03 DIAGNOSIS — R293 Abnormal posture: Secondary | ICD-10-CM | POA: Diagnosis present

## 2021-11-03 DIAGNOSIS — Z89611 Acquired absence of right leg above knee: Secondary | ICD-10-CM | POA: Insufficient documentation

## 2021-11-03 DIAGNOSIS — M6281 Muscle weakness (generalized): Secondary | ICD-10-CM | POA: Insufficient documentation

## 2021-11-03 DIAGNOSIS — R269 Unspecified abnormalities of gait and mobility: Secondary | ICD-10-CM | POA: Insufficient documentation

## 2021-11-03 NOTE — Therapy (Signed)
OUTPATIENT PHYSICAL THERAPY LOWER EXTREMITY TREATMENT   Patient Name: Jasmine Morrow MRN: [id number] DOB:August 02, 1945, 76 y.o., female Today's Date: 11/03/2021   PT End of Session - 11/03/21 1138     Visit Number 13    Number of Visits 24    Date for PT Re-Evaluation 12/02/21    PT Start Time 1016    PT Stop Time 1115    PT Time Calculation (min) 59 min    Activity Tolerance Patient tolerated treatment well             Past Medical History:  Diagnosis Date   Anemia    Anxiety    Asthma    seasonal    GERD (gastroesophageal reflux disease)    Hypertension    PONV (postoperative nausea and vomiting)    Seasonal allergies    Sleep apnea    does not uses CPAP machine anymore    Past Surgical History:  Procedure Laterality Date   APPENDECTOMY     CESAREAN SECTION     CHOLECYSTECTOMY     DILATION AND CURETTAGE OF UTERUS     ESOPHAGOGASTRODUODENOSCOPY N/A 02/22/2019   Procedure: ESOPHAGOGASTRODUODENOSCOPY (EGD);  Surgeon: Toledo, Benay Pike, MD;  Location: [facility];  Service: Gastroenterology;  Laterality: N/A;   FLEXIBLE SIGMOIDOSCOPY N/A 02/22/2019   Procedure: FLEXIBLE SIGMOIDOSCOPY;  Surgeon: Toledo, Benay Pike, MD;  Location: [facility];  Service: Gastroenterology;  Laterality: N/A;   HARDWARE REMOVAL Right 11/12/2019   Procedure: Right ankle hardware removal;  Surgeon: Hessie Knows, MD;  Location: [facility];  Service: Orthopedics;  Laterality: Right;   HARDWARE REMOVAL Right 04/17/2020   Procedure: HARDWARE REMOVAL FROM RIGHT ANKLE AND RIGHT KNEE; IMPLANT OF CEMENT SPACERS IN RIGHT KNEE;  Surgeon: Dereck Leep, MD;  Location: [facility];  Service: Orthopedics;  Laterality: Right;   JOINT REPLACEMENT     ORIF ANKLE FRACTURE Right 03/14/2019   Procedure: OPEN REDUCTION INTERNAL FIXATION (ORIF) ANKLE FRACTURE, MEDIAL MALLEOLUS;  Surgeon: Hessie Knows, MD;  Location: [facility];  Service: Orthopedics;  Laterality: Right;   ORIF ANKLE FRACTURE Right 02/08/2019    Procedure: OPEN REDUCTION INTERNAL FIXATION (ORIF) ANKLE FRACTURE, post malleolus;  Surgeon: Hessie Knows, MD;  Location: [facility];  Service: Orthopedics;  Laterality: Right;   REPLACEMENT TOTAL KNEE Bilateral 2008   2009   SCAR DEBRIDEMENT OF TOTAL KNEE  01/2020   SYNDESMOSIS REPAIR Right 02/08/2019   Procedure: SYNDESMOSIS REPAIR;  Surgeon: Hessie Knows, MD;  Location: [facility];  Service: Orthopedics;  Laterality: Right;   TEE WITHOUT CARDIOVERSION N/A 02/26/2019   Procedure: TRANSESOPHAGEAL ECHOCARDIOGRAM (TEE);  Surgeon: Corey Skains, MD;  Location: [facility];  Service: Cardiovascular;  Laterality: N/A;   TOTAL KNEE REVISION WITH SCAR DEBRIDEMENT/PATELLA REVISION WITH POLY EXCHANGE Right 01/24/2020   Procedure: TOTAL KNEE REVISION WITH SCAR DEBRIDEMENT/PATELLA REVISION WITH POLY EXCHANGE;  Surgeon: Dereck Leep, MD;  Location: [facility];  Service: Orthopedics;  Laterality: Right;   TUMOR REMOVAL     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   Patient Active Problem List   Diagnosis Date Noted   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[place]) 04/17/2020   Anxiety 04/17/2020   History of GI diverticular bleed 04/17/2020   Chest pain 04/17/2020   PAD (peripheral artery disease) ([place]) 04/05/2020   Ulcer of right leg ([place]) 04/05/2020   COPD (chronic obstructive pulmonary disease) ([place]) 04/05/2020   GERD (gastroesophageal reflux disease) 04/05/2020   Abscess of right knee 01/22/2020   Pressure injury of skin 02/28/2019  SOB (shortness of breath)    Gastrointestinal hemorrhage    Sepsis (HCC)    Symptomatic anemia 02/20/2019   Trimalleolar fracture of ankle, closed, right, initial encounter 02/07/2019   Multiple lung nodules 03/31/2014   Extrinsic asthma 03/27/2014   OSA on CPAP 03/27/2014    PCP: Miller, Mark F, MD  REFERRING PROVIDER: Miller, Mark F, MD  REFERRING DIAG: Above knee amputation of right lower extremity  THERAPY DIAG:  S/P AKA (above knee amputation) unilateral, right  (HCC)  Muscle weakness (generalized)  Gait difficulty  Abnormal posture  Rationale for Evaluation and Treatment Rehabilitation  ONSET DATE: 12/02/20  SUBJECTIVE:  EVALUATION  PERTINENT HISTORY: Patient Profile:   Jasmine Morrow is a 76 y.o. female Chief Complaint  Patient presents with  Visit Follow Up  Doing well.   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 Cardiac cath 10/2011 showed completely normal coronary arteries, however she did have mild left ventricular dysfunction with anterior wall motion abnormality. Left ventricular EF was 51%.  History of chickenpox  Hyperlipidemia  patient states she has never been diagnosed with this  Hypertension  Knee joint replacement by other means  Macular degeneration  Major depressive disorder, recurrent, mild (CMS-HCC)  Medicare annual wellness visit, initial 08/09/2019  7/21  Multiple thyroid nodules  Normal coronary arteries 09/06/2013  Normal coronary anatomy by cardiac catheterization 10/12/11  Osteoarthritis  Ovarian cyst  PONV (postoperative nausea and vomiting)  nausea  Postoperative urinary retention 12/08/2020  Rash on lips 12/21/2020  Seasonal allergies  Sinusitis, unspecified  Sleep apnea  On CPAP.  Slow transit constipation 12/10/2020  Trimalleolar fracture of ankle, closed, right, initial encounter 02/07/2019  Complicated by  staph infection 1/21, 4 weeks IV Ancef, Menz  Ulcer   Past Surgical History:  Procedure Laterality Date  SALPINGO OOPHORECTOMY Bilateral 1993  Right total knee arthroplasty 10/11/2006  Dr Hooten  Left total knee arthroplasty 10/12/2007  Dr Hooten  COLONOSCOPY 07/30/2012  internal hemorrhoids, diverticulosis  ANKLE ARTHODESIS W/ ARTHROSCOPY Right 02/08/2019  03/14/2019 second surgery  COLONOSCOPY 02/22/2019  Blood in entire colon/Diverticulosis - Presumed diverticular bleed. No repeat recommended per TKT.  EGD 02/22/2019  Gastritis/gastric ulcers/Hiatal hernia/Otherwise normal - no repeat recommended per TKT.  ORIF ANKLE FRACTURE Right 03/14/2019  Dr. Menz  REMOVAL HARDWARE ANKLE FOOT/TOES Right 11/12/2019  Dr. Menz  Right knee arthrotomy, irrigation and debridement of the right knee, polyethylene exchange 01/24/2020  Dr Hooten  Removal of hardware (plates and screws) from the right ankle with irrigation, debridement, and placement of Stimulan antibiotic beads 04/17/2020  Dr Hooten  Right knee arthrotomy, extensive irrigation debridement, removal of right total knee implants, and placement of antibiotic impregnated polymethylmethacrylate cement spacer 04/17/2020  Dr Hooten  AMPUTATION LEG ABOVE KNEE AKA Right 12/02/2020  Procedure: AMPUTATION, THIGH, THROUGH FEMUR, ANY LEVEL; Surgeon: Chun, Danielle Sunae, MD; Location: [facility]; Service: Orthopedics; Laterality: Right;  TRANSFER ADJACENT TISSUE LEG Right 12/02/2020  Procedure: ADJACENT TISSUE TRANSFER OR REARRANGEMENT, LEG;   DEFECT 10 SQ CM OR LESS; Surgeon: Chun, Danielle Sunae, MD; Location: [facility]; Service: Orthopedics; Laterality: Right;  ASPIRATION/INJECTION MAJOR JOINT/BURSA KNEE Left 12/02/2020  Procedure: ARTHROCENTESIS, ASPIRATION AND/OR INJECTION, MAJOR JOINT OR BURSA, KNEE; WITHOUT ULTRASOUND GUIDANCE; Surgeon: Chun, Danielle Sunae, MD; Location: [facility]; Service: Orthopedics; Laterality: Left;  REMOVAL  KNEE PROSTHESIS W/POSSIBLE PLACEMENT SPACER Left 01/18/2021  Procedure: REMOVAL OF PROSTHESIS, INCLUDING TOTAL KNEE PROSTHESIS, METHYLMETHACRYLATE WITH OR WITHOUT INSERTION OF SPACER, KNEE; Surgeon: Jiranek, William Arthur, MD; Location: [facility]; Service: Orthopedics; Laterality: Left;  INSERTION NON-BIODEGRADABLE DRUG DELIVERY IMPLANT Left 01/18/2021  Procedure: INSERTION, KNEE, bioresorbable, biodegradable, NON-BIODEGRADABLE DRUG DELIVERY IMPLANT; Surgeon: Jiranek, William Arthur, MD; Location: [facility]; Service: Orthopedics; Laterality: Left;  ABOVE KNEE LEG AMPUTATION  12/02/2020  APPENDECTOMY Est 1968  CESAREAN SECTION  CHOLECYSTECTOMY  FRACTURE SURGERY 1/21  HYSTERECTOMY partial  JOINT REPLACEMENT 2008 & 2009  TUBAL LIGATION 3/79   ALLERGIES: Allergies  Allergen Reactions  Singulair [Montelukast] Other (See Comments)  Gland swelling  Sulfa (Sulfonamide Antibiotics) Swelling  Oxycodone Dizziness and Other (See Comments)  Vancomycin Analogues Other (See Comments)  Ringing in ears  Avinza [Morphine] Itching  Darvocet A500 [Propoxyphene N-Acetaminophen] Nausea  Nickel Rash  Ultracet [Tramadol-Acetaminophen] Rash  Vicodin [Hydrocodone-Acetaminophen] Vomiting  Vioxx [Rofecoxib] Other (See Comments)  GI   CURRENT MEDICATIONS: Current Outpatient Medications: acetaminophen (TYLENOL) 500 MG tablet, Take 1,000 mg by mouth 3 (three) times a day Twice daily per patient., PRN Not Currently Taking aspirin 81 MG EC tablet, Take 1 tablet (81 mg total) by mouth at bedtime, Taking calcium carbonate-vitamin D3 (CALTRATE 600+D) 600 mg-10 mcg (400 unit) tablet, Take 0.5 tablets by mouth 2 (two) times daily, Taking cetirizine (ZYRTEC) 10 mg capsule, Take 1 capsule (10 mg total) by mouth once daily, Taking escitalopram oxalate (LEXAPRO) 10 MG tablet, Take 1 tablet (10 mg total) by mouth once daily, Taking estradioL (ESTRACE) 0.01 % (0.1 mg/gram) vaginal cream, Place 2 g vaginally twice a  week, Taking folic acid/multivit,iron,miner (MULTIVIT-IRON-MIN-FOLIC ACID ORAL), Take 1 tablet by mouth once daily, Taking gabapentin (NEURONTIN) 300 MG capsule, Take 1 capsule (300 mg total) by mouth 3 (three) times daily Phantom limb pain, Taking melatonin 3 mg tablet, Take 1 tablet (3 mg total) by mouth at bedtime, Taking multivitamin with iron (COMPLETE MULTIVITAMIN-MINERAL) tablet, Take 1 tablet by mouth, Taking multivitamin with minerals, EYE, (PRESERVISION AREDS 2) soft gel capsule, Take 1 capsule by mouth 2 (two) times daily, Taking nystatin (MYCOSTATIN) 100,000 unit/gram powder, Apply small amount topically twice daily for rash under breasts, PRN Not Currently Taking pantoprazole (PROTONIX) 40 MG DR tablet, Take 1 tablet (40 mg total) by mouth 2 (two) times daily. Please call to schedule an appt. Thanks!, Taking polyethylene glycol (MIRALAX) powder, Take 17 g by mouth once daily Mix in 4-8ounces of fluid prior to taking., Taking sennosides-docusate (SENOKOT-S) 8.6-50 mg tablet, Take 1 tablet by mouth 2 (two) times daily For constipation, Taking lactose-reduced food (ENSURE PLUS ORAL), Take 237 mLs by mouth 2 (two) times daily With breakfast and dinner  HPI   CLINICAL SUMMARY:  Patient post right AKA and working with prosthesis. 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 Able to do housework, improving  PAIN:  Are you having pain? Yes: NPRS scale: 5/10 Pain location: L knee Pain description: aching Aggravating factors: walking Relieving factors: rest  PRECAUTIONS: None  WEIGHT BEARING RESTRICTIONS No  FALLS:  Has patient fallen in last 6 months? No  LIVING ENVIRONMENT:   Lives with: lives with their spouse Lives in: House/apartment Stairs: No Has following equipment at home: Gilford Rile - 4 wheeled and Wheelchair (manual)  OCCUPATION: retired  PLOF: Requires assistive device for independence  PATIENT GOALS  improve standing tolerance/ pull up  pants/ ambulate with [place] safely.     OBJECTIVE:   PATIENT SURVEYS:  FOTO initial 46/ goal 98.    COGNITION:  Overall cognitive status: Within functional limits for tasks assessed     SENSATION: WFL  POSTURE: rounded shoulders, forward head, flexed trunk , and weight shift left  PALPATION: No tenderness along R distal residual limb.  Discussed phantom limb sensation.    LOWER EXTREMITY ROM:  B LE AROM WFL except L knee extension secondary to articulating spacer.  Did not assess L/R hip extension at this time.    LOWER EXTREMITY MMT:  MMT Right eval Left eval  Hip flexion 4/5 4/5  Hip extension    Hip abduction 4+/5 4+/5  Hip adduction 4+/5 4+/5  Hip internal rotation    Hip external rotation    Knee flexion N/A 4+/5  Knee extension N/A 4+/5  Ankle dorsiflexion  5/5  Ankle plantarflexion    Ankle inversion    Ankle eversion     (Blank rows = not tested)  GAIT: Distance walked: in gym/ //-bars Assistive device utilized: Environmental consultant - 2 wheeled Level of assistance: CGA Comments: Cuing for posture correction with mirror feedback.  Flexed posture/ heavy UE assist.     TODAY'S TREATMENT:  11/03/21  Subjective:  Pt. Reports moderate L knee and L elbow soreness/ discomfort.  Pt. Reports pts. Husband while getting out of car to do into [place].  Pts. Husband not injured but sore in wrist/ knee.     Objective:    There.ex.:    Seated marching in gray chair.     STS from mat table with proper technique/ use of RW.  Lateral walking in //-bars with posture correction 3 laps.     Seated B shoulder flexion with controlled breathing.     Nustep L4 10 min. B UE/LE (seat #11-10).  Consistent cadence for muscle endurance.   Gait training:   Walking in //-bars forward/ backwards (3 laps) with B UE and CGA for safety/ verbal cuing.   No issues with backwards walking.  6" step ups/ down in //-bars with focus on wt. Shift/ upright posture.    Ambulate in clinic (110 feet) with  RW and CGA with cuing to correct head position/ upright posture.  No LOB and consistent R hip flexion/ step length.    Amb. From Nustep to car with RW.   No LOB and good BOS but moderate cuing to correct posture.          PATIENT EDUCATION:  Education details: HEP/ gait and prosthetic training.  Person educated: Patient and Spouse Education method: Explanation, Demonstration, and Verbal cues Education comprehension: verbalized understanding, returned demonstration, and tactile cues required   HOME EXERCISE PROGRAM: Prone position hip stretches/ Standing marching at kitchen counter with w/c behind patient.      ASSESSMENT:  CLINICAL IMPRESSION: Pt. ambulates around PT clinic with use of prosthetic leg/ RW with consistent recip.   Good R knee mechanism control during gait/ standing wt. Shifting in clinic.  No single UE walking today secondary to fatigue/ L knee discomfort.  Pt. Becoming more mod. Independent with gait but still limited at home due to no one to assist pt.   Pt. Will benefit from skilled  PT services to improve standing tolerance/ gait independence and ADL.    OBJECTIVE IMPAIRMENTS Abnormal gait, decreased activity tolerance, decreased balance, decreased endurance, decreased mobility, difficulty walking, decreased ROM, decreased strength, decreased safety awareness, impaired flexibility, improper body mechanics, prosthetic dependency , and pain.   ACTIVITY LIMITATIONS carrying, lifting, standing, squatting, stairs, transfers, bed mobility, bathing, toileting, dressing, hygiene/grooming, and locomotion level  PARTICIPATION LIMITATIONS: cleaning, laundry, driving, shopping, community activity, and yard work  [place] Past/current experiences are also affecting patient's functional outcome.   REHAB POTENTIAL: Good  CLINICAL DECISION MAKING: Evolving/moderate complexity  EVALUATION COMPLEXITY: High   GOALS: Goals reviewed with patient? Yes  SHORT TERM GOALS:  Target date: 10/07/21 Pt. Able to don/doff R prosthetic leg independently to improve ADL/standing tolerance.  Baseline:  min. A to don/ doff prosthetic leg Goal status: Goal met   LONG TERM GOALS: Target date: 12/02/21  Pt. Will increase FOTO to 54 to improve functional mobility.  Baseline: initial FOTO 46 Goal status: On-going  2.  Pt. Able to tolerate standing 10 minutes while pulling up pants with mod. I safely while wearing prosthetic leg to improve daily activities.  Baseline:  limited standing tolerance Goal status: INITIAL  3.  Pt. Will ambulate 200 feet with use of SPC and mod. I to promote safety with household tasks/ walking into bathroom.   Baseline:  amb. With RW and min. A for safety.   Goal status: INITIAL   PLAN: PT FREQUENCY: 2x/week  PT DURATION: 12 weeks  PLANNED INTERVENTIONS: Therapeutic exercises, Therapeutic activity, Neuromuscular re-education, Balance training, Gait training, Patient/Family education, Self Care, Joint mobilization, Prosthetic training, DME instructions, and Manual therapy  PLAN FOR NEXT SESSION:  Issue info on driving with Caren Griffins Passenger transport manager).    Pura Spice, PT, DPT # [id number] 11/03/2021, 11:39 AM

## 2021-11-05 ENCOUNTER — Ambulatory Visit: Payer: Medicare Other | Admitting: Physical Therapy

## 2021-11-05 DIAGNOSIS — R269 Unspecified abnormalities of gait and mobility: Secondary | ICD-10-CM

## 2021-11-05 DIAGNOSIS — R293 Abnormal posture: Secondary | ICD-10-CM

## 2021-11-05 DIAGNOSIS — M6281 Muscle weakness (generalized): Secondary | ICD-10-CM

## 2021-11-05 DIAGNOSIS — Z89611 Acquired absence of right leg above knee: Secondary | ICD-10-CM | POA: Diagnosis not present

## 2021-11-05 NOTE — Therapy (Signed)
OUTPATIENT PHYSICAL THERAPY LOWER EXTREMITY TREATMENT   Patient Name: Jasmine Morrow MRN: [id number] DOB:1945/04/07, 76 y.o., female Today's Date: 11/05/2021   PT End of Session - 11/05/21 2026     Visit Number 14    Number of Visits 24    Date for PT Re-Evaluation 12/02/21    PT Start Time 1022    PT Stop Time 1115    PT Time Calculation (min) 53 min    Activity Tolerance Patient tolerated treatment well             Past Medical History:  Diagnosis Date   Anemia    Anxiety    Asthma    seasonal    GERD (gastroesophageal reflux disease)    Hypertension    PONV (postoperative nausea and vomiting)    Seasonal allergies    Sleep apnea    does not uses CPAP machine anymore    Past Surgical History:  Procedure Laterality Date   APPENDECTOMY     CESAREAN SECTION     CHOLECYSTECTOMY     DILATION AND CURETTAGE OF UTERUS     ESOPHAGOGASTRODUODENOSCOPY N/A 02/22/2019   Procedure: ESOPHAGOGASTRODUODENOSCOPY (EGD);  Surgeon: Toledo, Benay Pike, MD;  Location: [facility];  Service: Gastroenterology;  Laterality: N/A;   FLEXIBLE SIGMOIDOSCOPY N/A 02/22/2019   Procedure: FLEXIBLE SIGMOIDOSCOPY;  Surgeon: Toledo, Benay Pike, MD;  Location: [facility];  Service: Gastroenterology;  Laterality: N/A;   HARDWARE REMOVAL Right 11/12/2019   Procedure: Right ankle hardware removal;  Surgeon: Hessie Knows, MD;  Location: [facility];  Service: Orthopedics;  Laterality: Right;   HARDWARE REMOVAL Right 04/17/2020   Procedure: HARDWARE REMOVAL FROM RIGHT ANKLE AND RIGHT KNEE; IMPLANT OF CEMENT SPACERS IN RIGHT KNEE;  Surgeon: Dereck Leep, MD;  Location: [facility];  Service: Orthopedics;  Laterality: Right;   JOINT REPLACEMENT     ORIF ANKLE FRACTURE Right 03/14/2019   Procedure: OPEN REDUCTION INTERNAL FIXATION (ORIF) ANKLE FRACTURE, MEDIAL MALLEOLUS;  Surgeon: Hessie Knows, MD;  Location: [facility];  Service: Orthopedics;  Laterality: Right;   ORIF ANKLE FRACTURE Right 02/08/2019    Procedure: OPEN REDUCTION INTERNAL FIXATION (ORIF) ANKLE FRACTURE, post malleolus;  Surgeon: Hessie Knows, MD;  Location: [facility];  Service: Orthopedics;  Laterality: Right;   REPLACEMENT TOTAL KNEE Bilateral 2008   2009   SCAR DEBRIDEMENT OF TOTAL KNEE  01/2020   SYNDESMOSIS REPAIR Right 02/08/2019   Procedure: SYNDESMOSIS REPAIR;  Surgeon: Hessie Knows, MD;  Location: [facility];  Service: Orthopedics;  Laterality: Right;   TEE WITHOUT CARDIOVERSION N/A 02/26/2019   Procedure: TRANSESOPHAGEAL ECHOCARDIOGRAM (TEE);  Surgeon: Corey Skains, MD;  Location: [facility];  Service: Cardiovascular;  Laterality: N/A;   TOTAL KNEE REVISION WITH SCAR DEBRIDEMENT/PATELLA REVISION WITH POLY EXCHANGE Right 01/24/2020   Procedure: TOTAL KNEE REVISION WITH SCAR DEBRIDEMENT/PATELLA REVISION WITH POLY EXCHANGE;  Surgeon: Dereck Leep, MD;  Location: [facility];  Service: Orthopedics;  Laterality: Right;   TUMOR REMOVAL     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   Patient Active Problem List   Diagnosis Date Noted   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[place]) 04/17/2020   Anxiety 04/17/2020   History of GI diverticular bleed 04/17/2020   Chest pain 04/17/2020   PAD (peripheral artery disease) ([place]) 04/05/2020   Ulcer of right leg ([place]) 04/05/2020   COPD (chronic obstructive pulmonary disease) ([place]) 04/05/2020   GERD (gastroesophageal reflux disease) 04/05/2020   Abscess of right knee 01/22/2020   Pressure injury of skin 02/28/2019  SOB (shortness of breath)    Gastrointestinal hemorrhage    Sepsis (HCC)    Symptomatic anemia 02/20/2019   Trimalleolar fracture of ankle, closed, right, initial encounter 02/07/2019   Multiple lung nodules 03/31/2014   Extrinsic asthma 03/27/2014   OSA on CPAP 03/27/2014    PCP: Miller, Mark F, MD  REFERRING PROVIDER: Miller, Mark F, MD  REFERRING DIAG: Above knee amputation of right lower extremity  THERAPY DIAG:  S/P AKA (above knee amputation) unilateral, right  (HCC)  Muscle weakness (generalized)  Gait difficulty  Abnormal posture  Rationale for Evaluation and Treatment Rehabilitation  ONSET DATE: 12/02/20  SUBJECTIVE:  EVALUATION  PERTINENT HISTORY: Patient Profile:   Jasmine Morrow is a 76 y.o. female Chief Complaint  Patient presents with  Visit Follow Up  Doing well.   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 Cardiac cath 10/2011 showed completely normal coronary arteries, however she did have mild left ventricular dysfunction with anterior wall motion abnormality. Left ventricular EF was 51%.  History of chickenpox  Hyperlipidemia  patient states she has never been diagnosed with this  Hypertension  Knee joint replacement by other means  Macular degeneration  Major depressive disorder, recurrent, mild (CMS-HCC)  Medicare annual wellness visit, initial 08/09/2019  7/21  Multiple thyroid nodules  Normal coronary arteries 09/06/2013  Normal coronary anatomy by cardiac catheterization 10/12/11  Osteoarthritis  Ovarian cyst  PONV (postoperative nausea and vomiting)  nausea  Postoperative urinary retention 12/08/2020  Rash on lips 12/21/2020  Seasonal allergies  Sinusitis, unspecified  Sleep apnea  On CPAP.  Slow transit constipation 12/10/2020  Trimalleolar fracture of ankle, closed, right, initial encounter 02/07/2019  Complicated by  staph infection 1/21, 4 weeks IV Ancef, Menz  Ulcer   Past Surgical History:  Procedure Laterality Date  SALPINGO OOPHORECTOMY Bilateral 1993  Right total knee arthroplasty 10/11/2006  Dr Hooten  Left total knee arthroplasty 10/12/2007  Dr Hooten  COLONOSCOPY 07/30/2012  internal hemorrhoids, diverticulosis  ANKLE ARTHODESIS W/ ARTHROSCOPY Right 02/08/2019  03/14/2019 second surgery  COLONOSCOPY 02/22/2019  Blood in entire colon/Diverticulosis - Presumed diverticular bleed. No repeat recommended per TKT.  EGD 02/22/2019  Gastritis/gastric ulcers/Hiatal hernia/Otherwise normal - no repeat recommended per TKT.  ORIF ANKLE FRACTURE Right 03/14/2019  Dr. Menz  REMOVAL HARDWARE ANKLE FOOT/TOES Right 11/12/2019  Dr. Menz  Right knee arthrotomy, irrigation and debridement of the right knee, polyethylene exchange 01/24/2020  Dr Hooten  Removal of hardware (plates and screws) from the right ankle with irrigation, debridement, and placement of Stimulan antibiotic beads 04/17/2020  Dr Hooten  Right knee arthrotomy, extensive irrigation debridement, removal of right total knee implants, and placement of antibiotic impregnated polymethylmethacrylate cement spacer 04/17/2020  Dr Hooten  AMPUTATION LEG ABOVE KNEE AKA Right 12/02/2020  Procedure: AMPUTATION, THIGH, THROUGH FEMUR, ANY LEVEL; Surgeon: Chun, Danielle Sunae, MD; Location: [facility]; Service: Orthopedics; Laterality: Right;  TRANSFER ADJACENT TISSUE LEG Right 12/02/2020  Procedure: ADJACENT TISSUE TRANSFER OR REARRANGEMENT, LEG;   DEFECT 10 SQ CM OR LESS; Surgeon: Chun, Danielle Sunae, MD; Location: [facility]; Service: Orthopedics; Laterality: Right;  ASPIRATION/INJECTION MAJOR JOINT/BURSA KNEE Left 12/02/2020  Procedure: ARTHROCENTESIS, ASPIRATION AND/OR INJECTION, MAJOR JOINT OR BURSA, KNEE; WITHOUT ULTRASOUND GUIDANCE; Surgeon: Chun, Danielle Sunae, MD; Location: [facility]; Service: Orthopedics; Laterality: Left;  REMOVAL  KNEE PROSTHESIS W/POSSIBLE PLACEMENT SPACER Left 01/18/2021  Procedure: REMOVAL OF PROSTHESIS, INCLUDING TOTAL KNEE PROSTHESIS, METHYLMETHACRYLATE WITH OR WITHOUT INSERTION OF SPACER, KNEE; Surgeon: Jiranek, William Arthur, MD; Location: [facility]; Service: Orthopedics; Laterality: Left;  INSERTION NON-BIODEGRADABLE DRUG DELIVERY IMPLANT Left 01/18/2021  Procedure: INSERTION, KNEE, bioresorbable, biodegradable, NON-BIODEGRADABLE DRUG DELIVERY IMPLANT; Surgeon: Jiranek, William Arthur, MD; Location: [facility]; Service: Orthopedics; Laterality: Left;  ABOVE KNEE LEG AMPUTATION  12/02/2020  APPENDECTOMY Est 1968  CESAREAN SECTION  CHOLECYSTECTOMY  FRACTURE SURGERY 1/21  HYSTERECTOMY partial  JOINT REPLACEMENT 2008 & 2009  TUBAL LIGATION 3/79   ALLERGIES: Allergies  Allergen Reactions  Singulair [Montelukast] Other (See Comments)  Gland swelling  Sulfa (Sulfonamide Antibiotics) Swelling  Oxycodone Dizziness and Other (See Comments)  Vancomycin Analogues Other (See Comments)  Ringing in ears  Avinza [Morphine] Itching  Darvocet A500 [Propoxyphene N-Acetaminophen] Nausea  Nickel Rash  Ultracet [Tramadol-Acetaminophen] Rash  Vicodin [Hydrocodone-Acetaminophen] Vomiting  Vioxx [Rofecoxib] Other (See Comments)  GI   CURRENT MEDICATIONS: Current Outpatient Medications: acetaminophen (TYLENOL) 500 MG tablet, Take 1,000 mg by mouth 3 (three) times a day Twice daily per patient., PRN Not Currently Taking aspirin 81 MG EC tablet, Take 1 tablet (81 mg total) by mouth at bedtime, Taking calcium carbonate-vitamin D3 (CALTRATE 600+D) 600 mg-10 mcg (400 unit) tablet, Take 0.5 tablets by mouth 2 (two) times daily, Taking cetirizine (ZYRTEC) 10 mg capsule, Take 1 capsule (10 mg total) by mouth once daily, Taking escitalopram oxalate (LEXAPRO) 10 MG tablet, Take 1 tablet (10 mg total) by mouth once daily, Taking estradioL (ESTRACE) 0.01 % (0.1 mg/gram) vaginal cream, Place 2 g vaginally twice a  week, Taking folic acid/multivit,iron,miner (MULTIVIT-IRON-MIN-FOLIC ACID ORAL), Take 1 tablet by mouth once daily, Taking gabapentin (NEURONTIN) 300 MG capsule, Take 1 capsule (300 mg total) by mouth 3 (three) times daily Phantom limb pain, Taking melatonin 3 mg tablet, Take 1 tablet (3 mg total) by mouth at bedtime, Taking multivitamin with iron (COMPLETE MULTIVITAMIN-MINERAL) tablet, Take 1 tablet by mouth, Taking multivitamin with minerals, EYE, (PRESERVISION AREDS 2) soft gel capsule, Take 1 capsule by mouth 2 (two) times daily, Taking nystatin (MYCOSTATIN) 100,000 unit/gram powder, Apply small amount topically twice daily for rash under breasts, PRN Not Currently Taking pantoprazole (PROTONIX) 40 MG DR tablet, Take 1 tablet (40 mg total) by mouth 2 (two) times daily. Please call to schedule an appt. Thanks!, Taking polyethylene glycol (MIRALAX) powder, Take 17 g by mouth once daily Mix in 4-8ounces of fluid prior to taking., Taking sennosides-docusate (SENOKOT-S) 8.6-50 mg tablet, Take 1 tablet by mouth 2 (two) times daily For constipation, Taking lactose-reduced food (ENSURE PLUS ORAL), Take 237 mLs by mouth 2 (two) times daily With breakfast and dinner  HPI   CLINICAL SUMMARY:  Patient post right AKA and working with prosthesis. 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 Able to do housework, improving  PAIN:  Are you having pain? Yes: NPRS scale: 5/10 Pain location: L knee Pain description: aching Aggravating factors: walking Relieving factors: rest  PRECAUTIONS: None  WEIGHT BEARING RESTRICTIONS No  FALLS:  Has patient fallen in last 6 months? No  LIVING ENVIRONMENT:   Lives with: lives with their spouse Lives in: House/apartment Stairs: No Has following equipment at home: Gilford Rile - 4 wheeled and Wheelchair (manual)  OCCUPATION: retired  PLOF: Requires assistive device for independence  PATIENT GOALS  improve standing tolerance/ pull up  pants/ ambulate with [place] safely.     OBJECTIVE:   PATIENT SURVEYS:  FOTO initial 46/ goal 34.    COGNITION:  Overall cognitive status: Within functional limits for tasks assessed     SENSATION: WFL  POSTURE: rounded shoulders, forward head, flexed trunk , and weight shift left  PALPATION: No tenderness along R distal residual limb.  Discussed phantom limb sensation.    LOWER EXTREMITY ROM:  B LE AROM WFL except L knee extension secondary to articulating spacer.  Did not assess L/R hip extension at this time.    LOWER EXTREMITY MMT:  MMT Right eval Left eval  Hip flexion 4/5 4/5  Hip extension    Hip abduction 4+/5 4+/5  Hip adduction 4+/5 4+/5  Hip internal rotation    Hip external rotation    Knee flexion N/A 4+/5  Knee extension N/A 4+/5  Ankle dorsiflexion  5/5  Ankle plantarflexion    Ankle inversion    Ankle eversion     (Blank rows = not tested)  GAIT: Distance walked: in gym/ //-bars Assistive device utilized: Environmental consultant - 2 wheeled Level of assistance: CGA Comments: Cuing for posture correction with mirror feedback.  Flexed posture/ heavy UE assist.     TODAY'S TREATMENT:  11/05/21  Subjective:  Pt. Reports no new issues.  Pt. Arrived to PT in w/c prior to donning prosthetic leg.     Objective:    There.ex.:    Supine SLR/ bolster bridging/ sidelying hip abduction 20x each.  Supine glut sets with holds 10x   Seated marching in gray chair.     Sidelying hip abduction strength 4/5 MMT.     STS from mat table with proper technique/ use of RW.   Nustep L4 10 min. B UE/LE (seat #[id number]).  Consistent cadence for muscle endurance.   Gait training:   Ascending/descending stairs with step to pattern and B UE assist on handrails.  CGA for safety/ cuing.  4 steps x 4.  Seated rest break after completing 2 sets of 4 stairs.      Walking in //-bars forward/ backwards (3 laps) with B UE and CGA for safety/ verbal cuing.   No issues with backwards walking.      Ambulate in clinic (125 feet) with RW and CGA with cuing to correct head position/ upright posture.  No LOB and consistent R hip flexion/ step length.    Amb. From Nustep to car with RW.   No LOB and good BOS but moderate cuing to correct posture.          PATIENT EDUCATION:  Education details: HEP/ gait and prosthetic training.  Person educated: Patient and Spouse Education method: Explanation, Demonstration, and Verbal cues Education comprehension: verbalized understanding, returned demonstration, and tactile cues required   HOME EXERCISE PROGRAM: Prone position hip stretches/ Standing marching at kitchen counter with w/c behind patient.      ASSESSMENT:  CLINICAL IMPRESSION: Pt. ambulates around PT clinic with use of prosthetic leg/ RW with consistent recip.   Good R knee mechanism control during gait/ standing wt. Shifting in clinic.  No single UE walking today secondary to fatigue/ L knee discomfort.  Pt. Becoming more mod. Independent with gait but still limited at home due  to no one to assist pt.   Pt. Will benefit from skilled PT services to improve standing tolerance/ gait independence and ADL.    OBJECTIVE IMPAIRMENTS Abnormal gait, decreased activity tolerance, decreased balance, decreased endurance, decreased mobility, difficulty walking, decreased ROM, decreased strength, decreased safety awareness, impaired flexibility, improper body mechanics, prosthetic dependency , and pain.   ACTIVITY LIMITATIONS carrying, lifting, standing, squatting, stairs, transfers, bed mobility, bathing, toileting, dressing, hygiene/grooming, and locomotion level  PARTICIPATION LIMITATIONS: cleaning, laundry, driving, shopping, community activity, and yard work  [place] Past/current experiences are also affecting patient's functional outcome.   REHAB POTENTIAL: Good  CLINICAL DECISION MAKING: Evolving/moderate complexity  EVALUATION COMPLEXITY: High   GOALS: Goals reviewed  with patient? Yes  SHORT TERM GOALS: Target date: 10/07/21 Pt. Able to don/doff R prosthetic leg independently to improve ADL/standing tolerance.  Baseline:  min. A to don/ doff prosthetic leg Goal status: Goal met   LONG TERM GOALS: Target date: 12/02/21  Pt. Will increase FOTO to 54 to improve functional mobility.  Baseline: initial FOTO 46 Goal status: On-going  2.  Pt. Able to tolerate standing 10 minutes while pulling up pants with mod. I safely while wearing prosthetic leg to improve daily activities.  Baseline:  limited standing tolerance Goal status: INITIAL  3.  Pt. Will ambulate 200 feet with use of SPC and mod. I to promote safety with household tasks/ walking into bathroom.   Baseline:  amb. With RW and min. A for safety.   Goal status: INITIAL   PLAN: PT FREQUENCY: 2x/week  PT DURATION: 12 weeks  PLANNED INTERVENTIONS: Therapeutic exercises, Therapeutic activity, Neuromuscular re-education, Balance training, Gait training, Patient/Family education, Self Care, Joint mobilization, Prosthetic training, DME instructions, and Manual therapy  PLAN FOR NEXT SESSION:  Issue info on driving with Caren Griffins Passenger transport manager).    Pura Spice, PT, DPT # [phone] 11/05/2021, 8:27 PM

## 2021-11-10 ENCOUNTER — Encounter: Payer: Self-pay | Admitting: Physical Therapy

## 2021-11-10 ENCOUNTER — Ambulatory Visit: Payer: Medicare Other | Admitting: Physical Therapy

## 2021-11-10 DIAGNOSIS — M6281 Muscle weakness (generalized): Secondary | ICD-10-CM

## 2021-11-10 DIAGNOSIS — Z89611 Acquired absence of right leg above knee: Secondary | ICD-10-CM

## 2021-11-10 DIAGNOSIS — R269 Unspecified abnormalities of gait and mobility: Secondary | ICD-10-CM

## 2021-11-10 DIAGNOSIS — R293 Abnormal posture: Secondary | ICD-10-CM

## 2021-11-10 NOTE — Therapy (Signed)
OUTPATIENT PHYSICAL THERAPY LOWER EXTREMITY TREATMENT   Patient Name: Jasmine Morrow MRN: [id number] DOB:[birth year], 76 y.o., female Today's Date: 11/10/2021   PT End of Session - 11/10/21 0914     Visit Number 15    Number of Visits 24    Date for PT Re-Evaluation 12/02/21    PT Start Time 0914    PT Stop Time 1008    PT Time Calculation (min) 54 min    Activity Tolerance Patient tolerated treatment well             Past Medical History:  Diagnosis Date   Anemia    Anxiety    Asthma    seasonal    GERD (gastroesophageal reflux disease)    Hypertension    PONV (postoperative nausea and vomiting)    Seasonal allergies    Sleep apnea    does not uses CPAP machine anymore    Past Surgical History:  Procedure Laterality Date   APPENDECTOMY     CESAREAN SECTION     CHOLECYSTECTOMY     DILATION AND CURETTAGE OF UTERUS     ESOPHAGOGASTRODUODENOSCOPY N/A 02/22/2019   Procedure: ESOPHAGOGASTRODUODENOSCOPY (EGD);  Surgeon: Toledo, Benay Pike, MD;  Location: [facility];  Service: Gastroenterology;  Laterality: N/A;   FLEXIBLE SIGMOIDOSCOPY N/A 02/22/2019   Procedure: FLEXIBLE SIGMOIDOSCOPY;  Surgeon: Toledo, Benay Pike, MD;  Location: [facility];  Service: Gastroenterology;  Laterality: N/A;   HARDWARE REMOVAL Right 11/12/2019   Procedure: Right ankle hardware removal;  Surgeon: Hessie Knows, MD;  Location: [facility];  Service: Orthopedics;  Laterality: Right;   HARDWARE REMOVAL Right 04/17/2020   Procedure: HARDWARE REMOVAL FROM RIGHT ANKLE AND RIGHT KNEE; IMPLANT OF CEMENT SPACERS IN RIGHT KNEE;  Surgeon: Dereck Leep, MD;  Location: [facility];  Service: Orthopedics;  Laterality: Right;   JOINT REPLACEMENT     ORIF ANKLE FRACTURE Right 03/14/2019   Procedure: OPEN REDUCTION INTERNAL FIXATION (ORIF) ANKLE FRACTURE, MEDIAL MALLEOLUS;  Surgeon: Hessie Knows, MD;  Location: [facility];  Service: Orthopedics;  Laterality: Right;   ORIF ANKLE FRACTURE Right 02/08/2019    Procedure: OPEN REDUCTION INTERNAL FIXATION (ORIF) ANKLE FRACTURE, post malleolus;  Surgeon: Hessie Knows, MD;  Location: [facility];  Service: Orthopedics;  Laterality: Right;   REPLACEMENT TOTAL KNEE Bilateral 2008   2009   SCAR DEBRIDEMENT OF TOTAL KNEE  01/2020   SYNDESMOSIS REPAIR Right 02/08/2019   Procedure: SYNDESMOSIS REPAIR;  Surgeon: Hessie Knows, MD;  Location: [facility];  Service: Orthopedics;  Laterality: Right;   TEE WITHOUT CARDIOVERSION N/A 02/26/2019   Procedure: TRANSESOPHAGEAL ECHOCARDIOGRAM (TEE);  Surgeon: Corey Skains, MD;  Location: [facility];  Service: Cardiovascular;  Laterality: N/A;   TOTAL KNEE REVISION WITH SCAR DEBRIDEMENT/PATELLA REVISION WITH POLY EXCHANGE Right 01/24/2020   Procedure: TOTAL KNEE REVISION WITH SCAR DEBRIDEMENT/PATELLA REVISION WITH POLY EXCHANGE;  Surgeon: Dereck Leep, MD;  Location: [facility];  Service: Orthopedics;  Laterality: Right;   TUMOR REMOVAL     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   Patient Active Problem List   Diagnosis Date Noted   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[place]) 04/17/2020   Anxiety 04/17/2020   History of GI diverticular bleed 04/17/2020   Chest pain 04/17/2020   PAD (peripheral artery disease) ([place]) 04/05/2020   Ulcer of right leg ([place]) 04/05/2020   COPD (chronic obstructive pulmonary disease) ([place]) 04/05/2020   GERD (gastroesophageal reflux disease) 04/05/2020   Abscess of right knee 01/22/2020   Pressure injury of skin 02/28/2019  SOB (shortness of breath)    Gastrointestinal hemorrhage    Sepsis (HCC)    Symptomatic anemia 02/20/2019   Trimalleolar fracture of ankle, closed, right, initial encounter 02/07/2019   Multiple lung nodules 03/31/2014   Extrinsic asthma 03/27/2014   OSA on CPAP 03/27/2014    PCP: Miller, Mark F, MD  REFERRING PROVIDER: Miller, Mark F, MD  REFERRING DIAG: Above knee amputation of right lower extremity  THERAPY DIAG:  S/P AKA (above knee amputation) unilateral, right  (HCC)  Muscle weakness (generalized)  Gait difficulty  Abnormal posture  Rationale for Evaluation and Treatment Rehabilitation  ONSET DATE: 12/02/20  SUBJECTIVE:  EVALUATION  PERTINENT HISTORY: Patient Profile:   Jasmine Morrow is a 76 y.o. female Chief Complaint  Patient presents with  Visit Follow Up  Doing well.   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 Cardiac cath 10/2011 showed completely normal coronary arteries, however she did have mild left ventricular dysfunction with anterior wall motion abnormality. Left ventricular EF was 51%.  History of chickenpox  Hyperlipidemia  patient states she has never been diagnosed with this  Hypertension  Knee joint replacement by other means  Macular degeneration  Major depressive disorder, recurrent, mild (CMS-HCC)  Medicare annual wellness visit, initial 08/09/2019  7/21  Multiple thyroid nodules  Normal coronary arteries 09/06/2013  Normal coronary anatomy by cardiac catheterization 10/12/11  Osteoarthritis  Ovarian cyst  PONV (postoperative nausea and vomiting)  nausea  Postoperative urinary retention 12/08/2020  Rash on lips 12/21/2020  Seasonal allergies  Sinusitis, unspecified  Sleep apnea  On CPAP.  Slow transit constipation 12/10/2020  Trimalleolar fracture of ankle, closed, right, initial encounter 02/07/2019  Complicated by  staph infection 1/21, 4 weeks IV Ancef, Menz  Ulcer   Past Surgical History:  Procedure Laterality Date  SALPINGO OOPHORECTOMY Bilateral 1993  Right total knee arthroplasty 10/11/2006  Dr Hooten  Left total knee arthroplasty 10/12/2007  Dr Hooten  COLONOSCOPY 07/30/2012  internal hemorrhoids, diverticulosis  ANKLE ARTHODESIS W/ ARTHROSCOPY Right 02/08/2019  03/14/2019 second surgery  COLONOSCOPY 02/22/2019  Blood in entire colon/Diverticulosis - Presumed diverticular bleed. No repeat recommended per TKT.  EGD 02/22/2019  Gastritis/gastric ulcers/Hiatal hernia/Otherwise normal - no repeat recommended per TKT.  ORIF ANKLE FRACTURE Right 03/14/2019  Dr. Menz  REMOVAL HARDWARE ANKLE FOOT/TOES Right 11/12/2019  Dr. Menz  Right knee arthrotomy, irrigation and debridement of the right knee, polyethylene exchange 01/24/2020  Dr Hooten  Removal of hardware (plates and screws) from the right ankle with irrigation, debridement, and placement of Stimulan antibiotic beads 04/17/2020  Dr Hooten  Right knee arthrotomy, extensive irrigation debridement, removal of right total knee implants, and placement of antibiotic impregnated polymethylmethacrylate cement spacer 04/17/2020  Dr Hooten  AMPUTATION LEG ABOVE KNEE AKA Right 12/02/2020  Procedure: AMPUTATION, THIGH, THROUGH FEMUR, ANY LEVEL; Surgeon: Chun, Danielle Sunae, MD; Location: [facility]; Service: Orthopedics; Laterality: Right;  TRANSFER ADJACENT TISSUE LEG Right 12/02/2020  Procedure: ADJACENT TISSUE TRANSFER OR REARRANGEMENT, LEG;   DEFECT 10 SQ CM OR LESS; Surgeon: Chun, Danielle Sunae, MD; Location: [facility]; Service: Orthopedics; Laterality: Right;  ASPIRATION/INJECTION MAJOR JOINT/BURSA KNEE Left 12/02/2020  Procedure: ARTHROCENTESIS, ASPIRATION AND/OR INJECTION, MAJOR JOINT OR BURSA, KNEE; WITHOUT ULTRASOUND GUIDANCE; Surgeon: Chun, Danielle Sunae, MD; Location: [facility]; Service: Orthopedics; Laterality: Left;  REMOVAL  KNEE PROSTHESIS W/POSSIBLE PLACEMENT SPACER Left 01/18/2021  Procedure: REMOVAL OF PROSTHESIS, INCLUDING TOTAL KNEE PROSTHESIS, METHYLMETHACRYLATE WITH OR WITHOUT INSERTION OF SPACER, KNEE; Surgeon: Jiranek, William Arthur, MD; Location: [facility]; Service: Orthopedics; Laterality: Left;  INSERTION NON-BIODEGRADABLE DRUG DELIVERY IMPLANT Left 01/18/2021  Procedure: INSERTION, KNEE, bioresorbable, biodegradable, NON-BIODEGRADABLE DRUG DELIVERY IMPLANT; Surgeon: Jiranek, William Arthur, MD; Location: [facility]; Service: Orthopedics; Laterality: Left;  ABOVE KNEE LEG AMPUTATION  12/02/2020  APPENDECTOMY Est 1968  CESAREAN SECTION  CHOLECYSTECTOMY  FRACTURE SURGERY 1/21  HYSTERECTOMY partial  JOINT REPLACEMENT 2008 & 2009  TUBAL LIGATION 3/79   ALLERGIES: Allergies  Allergen Reactions  Singulair [Montelukast] Other (See Comments)  Gland swelling  Sulfa (Sulfonamide Antibiotics) Swelling  Oxycodone Dizziness and Other (See Comments)  Vancomycin Analogues Other (See Comments)  Ringing in ears  Avinza [Morphine] Itching  Darvocet A500 [Propoxyphene N-Acetaminophen] Nausea  Nickel Rash  Ultracet [Tramadol-Acetaminophen] Rash  Vicodin [Hydrocodone-Acetaminophen] Vomiting  Vioxx [Rofecoxib] Other (See Comments)  GI   CURRENT MEDICATIONS: Current Outpatient Medications: acetaminophen (TYLENOL) 500 MG tablet, Take 1,000 mg by mouth 3 (three) times a day Twice daily per patient., PRN Not Currently Taking aspirin 81 MG EC tablet, Take 1 tablet (81 mg total) by mouth at bedtime, Taking calcium carbonate-vitamin D3 (CALTRATE 600+D) 600 mg-10 mcg (400 unit) tablet, Take 0.5 tablets by mouth 2 (two) times daily, Taking cetirizine (ZYRTEC) 10 mg capsule, Take 1 capsule (10 mg total) by mouth once daily, Taking escitalopram oxalate (LEXAPRO) 10 MG tablet, Take 1 tablet (10 mg total) by mouth once daily, Taking estradioL (ESTRACE) 0.01 % (0.1 mg/gram) vaginal cream, Place 2 g vaginally twice a  week, Taking folic acid/multivit,iron,miner (MULTIVIT-IRON-MIN-FOLIC ACID ORAL), Take 1 tablet by mouth once daily, Taking gabapentin (NEURONTIN) 300 MG capsule, Take 1 capsule (300 mg total) by mouth 3 (three) times daily Phantom limb pain, Taking melatonin 3 mg tablet, Take 1 tablet (3 mg total) by mouth at bedtime, Taking multivitamin with iron (COMPLETE MULTIVITAMIN-MINERAL) tablet, Take 1 tablet by mouth, Taking multivitamin with minerals, EYE, (PRESERVISION AREDS 2) soft gel capsule, Take 1 capsule by mouth 2 (two) times daily, Taking nystatin (MYCOSTATIN) 100,000 unit/gram powder, Apply small amount topically twice daily for rash under breasts, PRN Not Currently Taking pantoprazole (PROTONIX) 40 MG DR tablet, Take 1 tablet (40 mg total) by mouth 2 (two) times daily. Please call to schedule an appt. Thanks!, Taking polyethylene glycol (MIRALAX) powder, Take 17 g by mouth once daily Mix in 4-8ounces of fluid prior to taking., Taking sennosides-docusate (SENOKOT-S) 8.6-50 mg tablet, Take 1 tablet by mouth 2 (two) times daily For constipation, Taking lactose-reduced food (ENSURE PLUS ORAL), Take 237 mLs by mouth 2 (two) times daily With breakfast and dinner  HPI   CLINICAL SUMMARY:  Patient post right AKA and working with prosthesis. 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 Able to do housework, improving  PAIN:  Are you having pain? Yes: NPRS scale: 5/10 Pain location: L knee Pain description: aching Aggravating factors: walking Relieving factors: rest  PRECAUTIONS: None  WEIGHT BEARING RESTRICTIONS No  FALLS:  Has patient fallen in last 6 months? No  LIVING ENVIRONMENT:   Lives with: lives with their spouse Lives in: House/apartment Stairs: No Has following equipment at home: Gilford Rile - 4 wheeled and Wheelchair (manual)  OCCUPATION: retired  PLOF: Requires assistive device for independence  PATIENT GOALS  improve standing tolerance/ pull up  pants/ ambulate with [place] safely.     OBJECTIVE:   PATIENT SURVEYS:  FOTO initial 46/ goal 58.    COGNITION:  Overall cognitive status: Within functional limits for tasks assessed     SENSATION: WFL  POSTURE: rounded shoulders, forward head, flexed trunk , and weight shift left  PALPATION: No tenderness along R distal residual limb.  Discussed phantom limb sensation.    LOWER EXTREMITY ROM:  B LE AROM WFL except L knee extension secondary to articulating spacer.  Did not assess L/R hip extension at this time.    LOWER EXTREMITY MMT:  MMT Right eval Left eval  Hip flexion 4/5 4/5  Hip extension    Hip abduction 4+/5 4+/5  Hip adduction 4+/5 4+/5  Hip internal rotation    Hip external rotation    Knee flexion N/A 4+/5  Knee extension N/A 4+/5  Ankle dorsiflexion  5/5  Ankle plantarflexion    Ankle inversion    Ankle eversion     (Blank rows = not tested)  GAIT: Distance walked: in gym/ //-bars Assistive device utilized: Environmental consultant - 2 wheeled Level of assistance: CGA Comments: Cuing for posture correction with mirror feedback.  Flexed posture/ heavy UE assist.     TODAY'S TREATMENT:  11/10/21  Subjective:  Pt. Reports no new issues.  Pt. Arrived to PT in w/c prior to donning prosthetic leg on blue mat table.  Pt. Has f/u with Dr. Sabra Heck on 11/21.  Pt. Is planning to go to beach with husband next week.    Objective:    There.ex.:    Seated marching on blue mat table 10x.      STS from mat table with proper technique/ use of RW.   Functional reaching and turning to R with elevated hospital table 2 sets with 12 cones.  CGA for safety.     Nustep L4 10 min. B UE/LE (seat #11-10).  Consistent cadence for muscle endurance.   Gait training:   Ascending/descending stairs with step to pattern and B UE assist on handrails.  CGA for safety/ cuing.  4 steps x 2.  Seated rest break after completing 2 sets of 4 stairs.      Walking in //-bars forward/ backwards (3  laps) with B UE and CGA for safety/ verbal cuing.   Fatigue noted.   Ambulate in clinic (140 feet) with RW and CGA with cuing to correct head position/ upright posture.  No LOB and consistent R hip flexion/ step length.    Amb. From Nustep to car with RW.   No LOB and good BOS but moderate cuing to correct posture.          PATIENT EDUCATION:  Education details: HEP/ gait and prosthetic training.  Person educated: Patient and Spouse Education method: Explanation, Demonstration, and Verbal cues Education comprehension: verbalized understanding, returned demonstration, and tactile cues required   HOME EXERCISE PROGRAM: Prone position hip stretches/ Standing marching at kitchen counter with w/c behind patient.      ASSESSMENT:  CLINICAL IMPRESSION: Pt. ambulates around PT clinic with use of prosthetic leg/ RW with consistent recip.   Good R knee mechanism control during gait/ standing wt. Shifting in clinic.  No single UE walking today secondary to fatigue/  L knee discomfort.  Pt. Becoming more mod. Independent with gait but still limited at home due to no one to assist pt.   Pt. Challenged with functional reaching/ wt. Shifting without UE assist.  PT recommends CGA for safety.  Pt. Will benefit from skilled PT services to improve standing tolerance/ gait independence and ADL.    OBJECTIVE IMPAIRMENTS Abnormal gait, decreased activity tolerance, decreased balance, decreased endurance, decreased mobility, difficulty walking, decreased ROM, decreased strength, decreased safety awareness, impaired flexibility, improper body mechanics, prosthetic dependency , and pain.   ACTIVITY LIMITATIONS carrying, lifting, standing, squatting, stairs, transfers, bed mobility, bathing, toileting, dressing, hygiene/grooming, and locomotion level  PARTICIPATION LIMITATIONS: cleaning, laundry, driving, shopping, community activity, and yard work  [place] Past/current experiences are also affecting  patient's functional outcome.   REHAB POTENTIAL: Good  CLINICAL DECISION MAKING: Evolving/moderate complexity  EVALUATION COMPLEXITY: High   GOALS: Goals reviewed with patient? Yes  SHORT TERM GOALS: Target date: 10/07/21 Pt. Able to don/doff R prosthetic leg independently to improve ADL/standing tolerance.  Baseline:  min. A to don/ doff prosthetic leg Goal status: Goal met   LONG TERM GOALS: Target date: 12/02/21  Pt. Will increase FOTO to 54 to improve functional mobility.  Baseline: initial FOTO 46 Goal status: On-going  2.  Pt. Able to tolerate standing 10 minutes while pulling up pants with mod. I safely while wearing prosthetic leg to improve daily activities.  Baseline:  limited standing tolerance Goal status: INITIAL  3.  Pt. Will ambulate 200 feet with use of SPC and mod. I to promote safety with household tasks/ walking into bathroom.   Baseline:  amb. With RW and min. A for safety.   Goal status: INITIAL   PLAN: PT FREQUENCY: 2x/week  PT DURATION: 12 weeks  PLANNED INTERVENTIONS: Therapeutic exercises, Therapeutic activity, Neuromuscular re-education, Balance training, Gait training, Patient/Family education, Self Care, Joint mobilization, Prosthetic training, DME instructions, and Manual therapy  PLAN FOR NEXT SESSION:  Issue info on driving with Caren Griffins Passenger transport manager).    Pura Spice, PT, DPT # [phone] 11/10/2021, 6:53 PM

## 2021-11-12 ENCOUNTER — Ambulatory Visit: Payer: Medicare Other | Admitting: Physical Therapy

## 2021-11-12 DIAGNOSIS — R269 Unspecified abnormalities of gait and mobility: Secondary | ICD-10-CM

## 2021-11-12 DIAGNOSIS — M6281 Muscle weakness (generalized): Secondary | ICD-10-CM

## 2021-11-12 DIAGNOSIS — Z89611 Acquired absence of right leg above knee: Secondary | ICD-10-CM

## 2021-11-12 DIAGNOSIS — R293 Abnormal posture: Secondary | ICD-10-CM

## 2021-11-21 NOTE — Therapy (Signed)
OUTPATIENT PHYSICAL THERAPY LOWER EXTREMITY TREATMENT   Patient Name: Jasmine Morrow MRN: [id number] DOB:10-21-1945, 76 y.o., female Today's Date: 11/12/2021   PT End of Session - 11/21/21 1832     Visit Number 16    Number of Visits 24    Date for PT Re-Evaluation 12/02/21    PT Start Time 0950    PT Stop Time 1049    PT Time Calculation (min) 59 min    Activity Tolerance Patient tolerated treatment well             Past Medical History:  Diagnosis Date   Anemia    Anxiety    Asthma    seasonal    GERD (gastroesophageal reflux disease)    Hypertension    PONV (postoperative nausea and vomiting)    Seasonal allergies    Sleep apnea    does not uses CPAP machine anymore    Past Surgical History:  Procedure Laterality Date   APPENDECTOMY     CESAREAN SECTION     CHOLECYSTECTOMY     DILATION AND CURETTAGE OF UTERUS     ESOPHAGOGASTRODUODENOSCOPY N/A 02/22/2019   Procedure: ESOPHAGOGASTRODUODENOSCOPY (EGD);  Surgeon: Toledo, Benay Pike, MD;  Location: [facility];  Service: Gastroenterology;  Laterality: N/A;   FLEXIBLE SIGMOIDOSCOPY N/A 02/22/2019   Procedure: FLEXIBLE SIGMOIDOSCOPY;  Surgeon: Toledo, Benay Pike, MD;  Location: [facility];  Service: Gastroenterology;  Laterality: N/A;   HARDWARE REMOVAL Right 11/12/2019   Procedure: Right ankle hardware removal;  Surgeon: Hessie Knows, MD;  Location: [facility];  Service: Orthopedics;  Laterality: Right;   HARDWARE REMOVAL Right 04/17/2020   Procedure: HARDWARE REMOVAL FROM RIGHT ANKLE AND RIGHT KNEE; IMPLANT OF CEMENT SPACERS IN RIGHT KNEE;  Surgeon: Dereck Leep, MD;  Location: [facility];  Service: Orthopedics;  Laterality: Right;   JOINT REPLACEMENT     ORIF ANKLE FRACTURE Right 03/14/2019   Procedure: OPEN REDUCTION INTERNAL FIXATION (ORIF) ANKLE FRACTURE, MEDIAL MALLEOLUS;  Surgeon: Hessie Knows, MD;  Location: [facility];  Service: Orthopedics;  Laterality: Right;   ORIF ANKLE FRACTURE Right 02/08/2019    Procedure: OPEN REDUCTION INTERNAL FIXATION (ORIF) ANKLE FRACTURE, post malleolus;  Surgeon: Hessie Knows, MD;  Location: [facility];  Service: Orthopedics;  Laterality: Right;   REPLACEMENT TOTAL KNEE Bilateral 2008   2009   SCAR DEBRIDEMENT OF TOTAL KNEE  01/2020   SYNDESMOSIS REPAIR Right 02/08/2019   Procedure: SYNDESMOSIS REPAIR;  Surgeon: Hessie Knows, MD;  Location: [facility];  Service: Orthopedics;  Laterality: Right;   TEE WITHOUT CARDIOVERSION N/A 02/26/2019   Procedure: TRANSESOPHAGEAL ECHOCARDIOGRAM (TEE);  Surgeon: Corey Skains, MD;  Location: [facility];  Service: Cardiovascular;  Laterality: N/A;   TOTAL KNEE REVISION WITH SCAR DEBRIDEMENT/PATELLA REVISION WITH POLY EXCHANGE Right 01/24/2020   Procedure: TOTAL KNEE REVISION WITH SCAR DEBRIDEMENT/PATELLA REVISION WITH POLY EXCHANGE;  Surgeon: Dereck Leep, MD;  Location: [facility];  Service: Orthopedics;  Laterality: Right;   TUMOR REMOVAL     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   Patient Active Problem List   Diagnosis Date Noted   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[place]) 04/17/2020   Anxiety 04/17/2020   History of GI diverticular bleed 04/17/2020   Chest pain 04/17/2020   PAD (peripheral artery disease) ([place]) 04/05/2020   Ulcer of right leg ([place]) 04/05/2020   COPD (chronic obstructive pulmonary disease) ([place]) 04/05/2020   GERD (gastroesophageal reflux disease) 04/05/2020   Abscess of right knee 01/22/2020   Pressure injury of skin 02/28/2019  SOB (shortness of breath)    Gastrointestinal hemorrhage    Sepsis (HCC)    Symptomatic anemia 02/20/2019   Trimalleolar fracture of ankle, closed, right, initial encounter 02/07/2019   Multiple lung nodules 03/31/2014   Extrinsic asthma 03/27/2014   OSA on CPAP 03/27/2014    PCP: Miller, Mark F, MD  REFERRING PROVIDER: Miller, Mark F, MD  REFERRING DIAG: Above knee amputation of right lower extremity  THERAPY DIAG:  S/P AKA (above knee amputation) unilateral, right  (HCC)  Muscle weakness (generalized)  Gait difficulty  Abnormal posture  Rationale for Evaluation and Treatment Rehabilitation  ONSET DATE: 12/02/20  SUBJECTIVE:  EVALUATION  PERTINENT HISTORY: Patient Profile:   Jasmine Morrow is a 76 y.o. female Chief Complaint  Patient presents with  Visit Follow Up  Doing well.   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 Cardiac cath 10/2011 showed completely normal coronary arteries, however she did have mild left ventricular dysfunction with anterior wall motion abnormality. Left ventricular EF was 51%.  History of chickenpox  Hyperlipidemia  patient states she has never been diagnosed with this  Hypertension  Knee joint replacement by other means  Macular degeneration  Major depressive disorder, recurrent, mild (CMS-HCC)  Medicare annual wellness visit, initial 08/09/2019  7/21  Multiple thyroid nodules  Normal coronary arteries 09/06/2013  Normal coronary anatomy by cardiac catheterization 10/12/11  Osteoarthritis  Ovarian cyst  PONV (postoperative nausea and vomiting)  nausea  Postoperative urinary retention 12/08/2020  Rash on lips 12/21/2020  Seasonal allergies  Sinusitis, unspecified  Sleep apnea  On CPAP.  Slow transit constipation 12/10/2020  Trimalleolar fracture of ankle, closed, right, initial encounter 02/07/2019  Complicated by  staph infection 1/21, 4 weeks IV Ancef, Menz  Ulcer   Past Surgical History:  Procedure Laterality Date  SALPINGO OOPHORECTOMY Bilateral 1993  Right total knee arthroplasty 10/11/2006  Dr Hooten  Left total knee arthroplasty 10/12/2007  Dr Hooten  COLONOSCOPY 07/30/2012  internal hemorrhoids, diverticulosis  ANKLE ARTHODESIS W/ ARTHROSCOPY Right 02/08/2019  03/14/2019 second surgery  COLONOSCOPY 02/22/2019  Blood in entire colon/Diverticulosis - Presumed diverticular bleed. No repeat recommended per TKT.  EGD 02/22/2019  Gastritis/gastric ulcers/Hiatal hernia/Otherwise normal - no repeat recommended per TKT.  ORIF ANKLE FRACTURE Right 03/14/2019  Dr. Menz  REMOVAL HARDWARE ANKLE FOOT/TOES Right 11/12/2019  Dr. Menz  Right knee arthrotomy, irrigation and debridement of the right knee, polyethylene exchange 01/24/2020  Dr Hooten  Removal of hardware (plates and screws) from the right ankle with irrigation, debridement, and placement of Stimulan antibiotic beads 04/17/2020  Dr Hooten  Right knee arthrotomy, extensive irrigation debridement, removal of right total knee implants, and placement of antibiotic impregnated polymethylmethacrylate cement spacer 04/17/2020  Dr Hooten  AMPUTATION LEG ABOVE KNEE AKA Right 12/02/2020  Procedure: AMPUTATION, THIGH, THROUGH FEMUR, ANY LEVEL; Surgeon: Chun, Danielle Sunae, MD; Location: [facility]; Service: Orthopedics; Laterality: Right;  TRANSFER ADJACENT TISSUE LEG Right 12/02/2020  Procedure: ADJACENT TISSUE TRANSFER OR REARRANGEMENT, LEG;   DEFECT 10 SQ CM OR LESS; Surgeon: Chun, Danielle Sunae, MD; Location: [facility]; Service: Orthopedics; Laterality: Right;  ASPIRATION/INJECTION MAJOR JOINT/BURSA KNEE Left 12/02/2020  Procedure: ARTHROCENTESIS, ASPIRATION AND/OR INJECTION, MAJOR JOINT OR BURSA, KNEE; WITHOUT ULTRASOUND GUIDANCE; Surgeon: Chun, Danielle Sunae, MD; Location: [facility]; Service: Orthopedics; Laterality: Left;  REMOVAL  KNEE PROSTHESIS W/POSSIBLE PLACEMENT SPACER Left 01/18/2021  Procedure: REMOVAL OF PROSTHESIS, INCLUDING TOTAL KNEE PROSTHESIS, METHYLMETHACRYLATE WITH OR WITHOUT INSERTION OF SPACER, KNEE; Surgeon: Jiranek, William Arthur, MD; Location: [facility]; Service: Orthopedics; Laterality: Left;  INSERTION NON-BIODEGRADABLE DRUG DELIVERY IMPLANT Left 01/18/2021  Procedure: INSERTION, KNEE, bioresorbable, biodegradable, NON-BIODEGRADABLE DRUG DELIVERY IMPLANT; Surgeon: Jiranek, William Arthur, MD; Location: [facility]; Service: Orthopedics; Laterality: Left;  ABOVE KNEE LEG AMPUTATION  12/02/2020  APPENDECTOMY Est 1968  CESAREAN SECTION  CHOLECYSTECTOMY  FRACTURE SURGERY 1/21  HYSTERECTOMY partial  JOINT REPLACEMENT 2008 & 2009  TUBAL LIGATION 3/79   ALLERGIES: Allergies  Allergen Reactions  Singulair [Montelukast] Other (See Comments)  Gland swelling  Sulfa (Sulfonamide Antibiotics) Swelling  Oxycodone Dizziness and Other (See Comments)  Vancomycin Analogues Other (See Comments)  Ringing in ears  Avinza [Morphine] Itching  Darvocet A500 [Propoxyphene N-Acetaminophen] Nausea  Nickel Rash  Ultracet [Tramadol-Acetaminophen] Rash  Vicodin [Hydrocodone-Acetaminophen] Vomiting  Vioxx [Rofecoxib] Other (See Comments)  GI   CURRENT MEDICATIONS: Current Outpatient Medications: acetaminophen (TYLENOL) 500 MG tablet, Take 1,000 mg by mouth 3 (three) times a day Twice daily per patient., PRN Not Currently Taking aspirin 81 MG EC tablet, Take 1 tablet (81 mg total) by mouth at bedtime, Taking calcium carbonate-vitamin D3 (CALTRATE 600+D) 600 mg-10 mcg (400 unit) tablet, Take 0.5 tablets by mouth 2 (two) times daily, Taking cetirizine (ZYRTEC) 10 mg capsule, Take 1 capsule (10 mg total) by mouth once daily, Taking escitalopram oxalate (LEXAPRO) 10 MG tablet, Take 1 tablet (10 mg total) by mouth once daily, Taking estradioL (ESTRACE) 0.01 % (0.1 mg/gram) vaginal cream, Place 2 g vaginally twice a  week, Taking folic acid/multivit,iron,miner (MULTIVIT-IRON-MIN-FOLIC ACID ORAL), Take 1 tablet by mouth once daily, Taking gabapentin (NEURONTIN) 300 MG capsule, Take 1 capsule (300 mg total) by mouth 3 (three) times daily Phantom limb pain, Taking melatonin 3 mg tablet, Take 1 tablet (3 mg total) by mouth at bedtime, Taking multivitamin with iron (COMPLETE MULTIVITAMIN-MINERAL) tablet, Take 1 tablet by mouth, Taking multivitamin with minerals, EYE, (PRESERVISION AREDS 2) soft gel capsule, Take 1 capsule by mouth 2 (two) times daily, Taking nystatin (MYCOSTATIN) 100,000 unit/gram powder, Apply small amount topically twice daily for rash under breasts, PRN Not Currently Taking pantoprazole (PROTONIX) 40 MG DR tablet, Take 1 tablet (40 mg total) by mouth 2 (two) times daily. Please call to schedule an appt. Thanks!, Taking polyethylene glycol (MIRALAX) powder, Take 17 g by mouth once daily Mix in 4-8ounces of fluid prior to taking., Taking sennosides-docusate (SENOKOT-S) 8.6-50 mg tablet, Take 1 tablet by mouth 2 (two) times daily For constipation, Taking lactose-reduced food (ENSURE PLUS ORAL), Take 237 mLs by mouth 2 (two) times daily With breakfast and dinner  HPI   CLINICAL SUMMARY:  Patient post right AKA and working with prosthesis. 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 Able to do housework, improving  PAIN:  Are you having pain? Yes: NPRS scale: 5/10 Pain location: L knee Pain description: aching Aggravating factors: walking Relieving factors: rest  PRECAUTIONS: None  WEIGHT BEARING RESTRICTIONS No  FALLS:  Has patient fallen in last 6 months? No  LIVING ENVIRONMENT:   Lives with: lives with their spouse Lives in: House/apartment Stairs: No Has following equipment at home: Gilford Rile - 4 wheeled and Wheelchair (manual)  OCCUPATION: retired  PLOF: Requires assistive device for independence  PATIENT GOALS  improve standing tolerance/ pull up  pants/ ambulate with [place] safely.     OBJECTIVE:   PATIENT SURVEYS:  FOTO initial 46/ goal 3.    COGNITION:  Overall cognitive status: Within functional limits for tasks assessed     SENSATION: WFL  POSTURE: rounded shoulders, forward head, flexed trunk , and weight shift left  PALPATION: No tenderness along R distal residual limb.  Discussed phantom limb sensation.    LOWER EXTREMITY ROM:  B LE AROM WFL except L knee extension secondary to articulating spacer.  Did not assess L/R hip extension at this time.    LOWER EXTREMITY MMT:  MMT Right eval Left eval  Hip flexion 4/5 4/5  Hip extension    Hip abduction 4+/5 4+/5  Hip adduction 4+/5 4+/5  Hip internal rotation    Hip external rotation    Knee flexion N/A 4+/5  Knee extension N/A 4+/5  Ankle dorsiflexion  5/5  Ankle plantarflexion    Ankle inversion    Ankle eversion     (Blank rows = not tested)  GAIT: Distance walked: in gym/ //-bars Assistive device utilized: Environmental consultant - 2 wheeled Level of assistance: CGA Comments: Cuing for posture correction with mirror feedback.  Flexed posture/ heavy UE assist.     TODAY'S TREATMENT:  11/12/21  Subjective:  Pt. Reports no new issues.  Pt. Arrived to PT in w/c prior to donning prosthetic leg on blue mat table.  Pt. Has f/u with Dr. Sabra Heck on 11/21.  Pt. Is planning to go to beach with husband tomorrow.     Objective:    There.ex.:    Seated marching on blue mat table 10x.   Seated wt. Shifting/ posture correction.   STS from mat table with proper technique from varying heights to lessen need for UE assist.     Nustep L4 10 min. B UE/LE (seat #[id number]).  Consistent cadence for muscle endurance.    Gait training:   Ascending/descending stairs with step to pattern and B UE assist on handrails.  CGA for safety/ cuing.  4 steps x 3.  Seated rest break after completing 2 sets of 4 stairs.      Walking in //-bars forward/ backwards (3 laps) with B UE and CGA for  safety/ verbal cuing.   Mirror feedback for posture correction.  Discussed use of forearm crutches.     Ambulate in clinic (100 feet/ 60 feet) with RW and CGA with cuing to correct head position/ upright posture.  No LOB and consistent R hip flexion/ step length.    Amb. From Nustep to car with RW.   No LOB and good BOS but moderate cuing to correct posture.          PATIENT EDUCATION:  Education details: HEP/ gait and prosthetic training.  Person educated: Patient and Spouse Education method: Explanation, Demonstration, and Verbal cues Education comprehension: verbalized understanding, returned demonstration, and tactile cues required   HOME EXERCISE PROGRAM: Prone position hip stretches/ Standing marching at kitchen counter with w/c behind patient.      ASSESSMENT:  CLINICAL IMPRESSION: Pt. ambulates around PT clinic with use of prosthetic leg/ RW with consistent recip.   Good R knee mechanism control during gait/ standing wt. Shifting in clinic.  Pt. Encouraged to walk at  beach house with dtr. In law assistance.  Pt. Becoming more mod. Independent with gait but still limited at home due to no one to assist pt.  Pts. Husband hesitant to assist due to safety issues/ limitations.    Pt. Motivated and doing better with sit to stands into RW with less assist and cuing.  Pt. Will benefit from skilled PT services to improve standing tolerance/ gait independence and ADL.    OBJECTIVE IMPAIRMENTS Abnormal gait, decreased activity tolerance, decreased balance, decreased endurance, decreased mobility, difficulty walking, decreased ROM, decreased strength, decreased safety awareness, impaired flexibility, improper body mechanics, prosthetic dependency , and pain.   ACTIVITY LIMITATIONS carrying, lifting, standing, squatting, stairs, transfers, bed mobility, bathing, toileting, dressing, hygiene/grooming, and locomotion level  PARTICIPATION LIMITATIONS: cleaning, laundry, driving, shopping,  community activity, and yard work  [place] Past/current experiences are also affecting patient's functional outcome.   REHAB POTENTIAL: Good  CLINICAL DECISION MAKING: Evolving/moderate complexity  EVALUATION COMPLEXITY: High   GOALS: Goals reviewed with patient? Yes  SHORT TERM GOALS: Target date: 10/07/21 Pt. Able to don/doff R prosthetic leg independently to improve ADL/standing tolerance.  Baseline:  min. A to don/ doff prosthetic leg Goal status: Goal met   LONG TERM GOALS: Target date: 12/02/21  Pt. Will increase FOTO to 54 to improve functional mobility.  Baseline: initial FOTO 46 Goal status: On-going  2.  Pt. Able to tolerate standing 10 minutes while pulling up pants with mod. I safely while wearing prosthetic leg to improve daily activities.  Baseline:  limited standing tolerance Goal status: INITIAL  3.  Pt. Will ambulate 200 feet with use of SPC and mod. I to promote safety with household tasks/ walking into bathroom.   Baseline:  amb. With RW and min. A for safety.   Goal status: INITIAL   PLAN: PT FREQUENCY: 2x/week  PT DURATION: 12 weeks  PLANNED INTERVENTIONS: Therapeutic exercises, Therapeutic activity, Neuromuscular re-education, Balance training, Gait training, Patient/Family education, Self Care, Joint mobilization, Prosthetic training, DME instructions, and Manual therapy  PLAN FOR NEXT SESSION:  discuss trip to beach    Pura Spice, PT, DPT # [phone] 11/21/2021, 6:34 PM

## 2021-11-23 ENCOUNTER — Ambulatory Visit: Payer: Medicare Other | Admitting: Physical Therapy

## 2021-11-23 DIAGNOSIS — Z89611 Acquired absence of right leg above knee: Secondary | ICD-10-CM

## 2021-11-23 DIAGNOSIS — R293 Abnormal posture: Secondary | ICD-10-CM

## 2021-11-23 DIAGNOSIS — M6281 Muscle weakness (generalized): Secondary | ICD-10-CM

## 2021-11-23 DIAGNOSIS — R269 Unspecified abnormalities of gait and mobility: Secondary | ICD-10-CM

## 2021-11-23 NOTE — Therapy (Signed)
OUTPATIENT PHYSICAL THERAPY LOWER EXTREMITY TREATMENT   Patient Name: Jasmine Morrow MRN: [id number] DOB:21-Oct-1945, 76 y.o., female Today's Date: 11/23/2021   PT End of Session - 11/23/21 2056     Visit Number 17    Number of Visits 24    Date for PT Re-Evaluation 12/02/21    PT Start Time 1040    PT Stop Time 1138    PT Time Calculation (min) 58 min    Activity Tolerance Patient tolerated treatment well             Past Medical History:  Diagnosis Date   Anemia    Anxiety    Asthma    seasonal    GERD (gastroesophageal reflux disease)    Hypertension    PONV (postoperative nausea and vomiting)    Seasonal allergies    Sleep apnea    does not uses CPAP machine anymore    Past Surgical History:  Procedure Laterality Date   APPENDECTOMY     CESAREAN SECTION     CHOLECYSTECTOMY     DILATION AND CURETTAGE OF UTERUS     ESOPHAGOGASTRODUODENOSCOPY N/A 02/22/2019   Procedure: ESOPHAGOGASTRODUODENOSCOPY (EGD);  Surgeon: Toledo, Benay Pike, MD;  Location: [facility];  Service: Gastroenterology;  Laterality: N/A;   FLEXIBLE SIGMOIDOSCOPY N/A 02/22/2019   Procedure: FLEXIBLE SIGMOIDOSCOPY;  Surgeon: Toledo, Benay Pike, MD;  Location: [facility];  Service: Gastroenterology;  Laterality: N/A;   HARDWARE REMOVAL Right 11/12/2019   Procedure: Right ankle hardware removal;  Surgeon: Hessie Knows, MD;  Location: [facility];  Service: Orthopedics;  Laterality: Right;   HARDWARE REMOVAL Right 04/17/2020   Procedure: HARDWARE REMOVAL FROM RIGHT ANKLE AND RIGHT KNEE; IMPLANT OF CEMENT SPACERS IN RIGHT KNEE;  Surgeon: Dereck Leep, MD;  Location: [facility];  Service: Orthopedics;  Laterality: Right;   JOINT REPLACEMENT     ORIF ANKLE FRACTURE Right 03/14/2019   Procedure: OPEN REDUCTION INTERNAL FIXATION (ORIF) ANKLE FRACTURE, MEDIAL MALLEOLUS;  Surgeon: Hessie Knows, MD;  Location: [facility];  Service: Orthopedics;  Laterality: Right;   ORIF ANKLE FRACTURE Right 02/08/2019    Procedure: OPEN REDUCTION INTERNAL FIXATION (ORIF) ANKLE FRACTURE, post malleolus;  Surgeon: Hessie Knows, MD;  Location: [facility];  Service: Orthopedics;  Laterality: Right;   REPLACEMENT TOTAL KNEE Bilateral 2008   2009   SCAR DEBRIDEMENT OF TOTAL KNEE  01/2020   SYNDESMOSIS REPAIR Right 02/08/2019   Procedure: SYNDESMOSIS REPAIR;  Surgeon: Hessie Knows, MD;  Location: [facility];  Service: Orthopedics;  Laterality: Right;   TEE WITHOUT CARDIOVERSION N/A 02/26/2019   Procedure: TRANSESOPHAGEAL ECHOCARDIOGRAM (TEE);  Surgeon: Corey Skains, MD;  Location: [facility];  Service: Cardiovascular;  Laterality: N/A;   TOTAL KNEE REVISION WITH SCAR DEBRIDEMENT/PATELLA REVISION WITH POLY EXCHANGE Right 01/24/2020   Procedure: TOTAL KNEE REVISION WITH SCAR DEBRIDEMENT/PATELLA REVISION WITH POLY EXCHANGE;  Surgeon: Dereck Leep, MD;  Location: [facility];  Service: Orthopedics;  Laterality: Right;   TUMOR REMOVAL     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   Patient Active Problem List   Diagnosis Date Noted   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[place]) 04/17/2020   Anxiety 04/17/2020   History of GI diverticular bleed 04/17/2020   Chest pain 04/17/2020   PAD (peripheral artery disease) ([place]) 04/05/2020   Ulcer of right leg ([place]) 04/05/2020   COPD (chronic obstructive pulmonary disease) ([place]) 04/05/2020   GERD (gastroesophageal reflux disease) 04/05/2020   Abscess of right knee 01/22/2020   Pressure injury of skin 02/28/2019  SOB (shortness of breath)    Gastrointestinal hemorrhage    Sepsis (HCC)    Symptomatic anemia 02/20/2019   Trimalleolar fracture of ankle, closed, right, initial encounter 02/07/2019   Multiple lung nodules 03/31/2014   Extrinsic asthma 03/27/2014   OSA on CPAP 03/27/2014    PCP: Miller, Mark F, MD  REFERRING PROVIDER: Miller, Mark F, MD  REFERRING DIAG: Above knee amputation of right lower extremity  THERAPY DIAG:  S/P AKA (above knee amputation) unilateral, right  (HCC)  Muscle weakness (generalized)  Gait difficulty  Abnormal posture  Rationale for Evaluation and Treatment Rehabilitation  ONSET DATE: 12/02/20  SUBJECTIVE:  EVALUATION  PERTINENT HISTORY: Patient Profile:   [name] is a 76 y.o. female Chief Complaint  Patient presents with  Visit Follow Up  Doing well.   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 Cardiac cath 10/2011 showed completely normal coronary arteries, however she did have mild left ventricular dysfunction with anterior wall motion abnormality. Left ventricular EF was 51%.  History of chickenpox  Hyperlipidemia  patient states she has never been diagnosed with this  Hypertension  Knee joint replacement by other means  Macular degeneration  Major depressive disorder, recurrent, mild (CMS-HCC)  Medicare annual wellness visit, initial 08/09/2019  7/21  Multiple thyroid nodules  Normal coronary arteries 09/06/2013  Normal coronary anatomy by cardiac catheterization 10/12/11  Osteoarthritis  Ovarian cyst  PONV (postoperative nausea and vomiting)  nausea  Postoperative urinary retention 12/08/2020  Rash on lips 12/21/2020  Seasonal allergies  Sinusitis, unspecified  Sleep apnea  On CPAP.  Slow transit constipation 12/10/2020  Trimalleolar fracture of ankle, closed, right, initial encounter 02/07/2019  Complicated by  staph infection 1/21, 4 weeks IV Ancef, Menz  Ulcer   Past Surgical History:  Procedure Laterality Date  SALPINGO OOPHORECTOMY Bilateral 1993  Right total knee arthroplasty 10/11/2006  Dr Hooten  Left total knee arthroplasty 10/12/2007  Dr Hooten  COLONOSCOPY 07/30/2012  internal hemorrhoids, diverticulosis  ANKLE ARTHODESIS W/ ARTHROSCOPY Right 02/08/2019  03/14/2019 second surgery  COLONOSCOPY 02/22/2019  Blood in entire colon/Diverticulosis - Presumed diverticular bleed. No repeat recommended per TKT.  EGD 02/22/2019  Gastritis/gastric ulcers/Hiatal hernia/Otherwise normal - no repeat recommended per TKT.  ORIF ANKLE FRACTURE Right 03/14/2019  Dr. Menz  REMOVAL HARDWARE ANKLE FOOT/TOES Right 11/12/2019  Dr. Menz  Right knee arthrotomy, irrigation and debridement of the right knee, polyethylene exchange 01/24/2020  Dr Hooten  Removal of hardware (plates and screws) from the right ankle with irrigation, debridement, and placement of Stimulan antibiotic beads 04/17/2020  Dr Hooten  Right knee arthrotomy, extensive irrigation debridement, removal of right total knee implants, and placement of antibiotic impregnated polymethylmethacrylate cement spacer 04/17/2020  Dr Hooten  AMPUTATION LEG ABOVE KNEE AKA Right 12/02/2020  Procedure: AMPUTATION, THIGH, THROUGH FEMUR, ANY LEVEL; Surgeon: Chun, Danielle Sunae, MD; Location: [facility]; Service: Orthopedics; Laterality: Right;  TRANSFER ADJACENT TISSUE LEG Right 12/02/2020  Procedure: ADJACENT TISSUE TRANSFER OR REARRANGEMENT, LEG;   DEFECT 10 SQ CM OR LESS; Surgeon: Chun, Danielle Sunae, MD; Location: [facility]; Service: Orthopedics; Laterality: Right;  ASPIRATION/INJECTION MAJOR JOINT/BURSA KNEE Left 12/02/2020  Procedure: ARTHROCENTESIS, ASPIRATION AND/OR INJECTION, MAJOR JOINT OR BURSA, KNEE; WITHOUT ULTRASOUND GUIDANCE; Surgeon: Chun, Danielle Sunae, MD; Location: [facility]; Service: Orthopedics; Laterality: Left;  REMOVAL  KNEE PROSTHESIS W/POSSIBLE PLACEMENT SPACER Left 01/18/2021  Procedure: REMOVAL OF PROSTHESIS, INCLUDING TOTAL KNEE PROSTHESIS, METHYLMETHACRYLATE WITH OR WITHOUT INSERTION OF SPACER, KNEE; Surgeon: Jiranek, William Arthur, MD; Location: [facility]; Service: Orthopedics; Laterality: Left;  INSERTION NON-BIODEGRADABLE DRUG DELIVERY IMPLANT Left 01/18/2021  Procedure: INSERTION, KNEE, bioresorbable, biodegradable, NON-BIODEGRADABLE DRUG DELIVERY IMPLANT; Surgeon: Jiranek, William Arthur, MD; Location: [facility]; Service: Orthopedics; Laterality: Left;  ABOVE KNEE LEG AMPUTATION  12/02/2020  APPENDECTOMY Est 1968  CESAREAN SECTION  CHOLECYSTECTOMY  FRACTURE SURGERY 1/21  HYSTERECTOMY partial  JOINT REPLACEMENT 2008 & 2009  TUBAL LIGATION 3/79   ALLERGIES: Allergies  Allergen Reactions  Singulair [Montelukast] Other (See Comments)  Gland swelling  Sulfa (Sulfonamide Antibiotics) Swelling  Oxycodone Dizziness and Other (See Comments)  Vancomycin Analogues Other (See Comments)  Ringing in ears  Avinza [Morphine] Itching  Darvocet A500 [Propoxyphene N-Acetaminophen] Nausea  Nickel Rash  Ultracet [Tramadol-Acetaminophen] Rash  Vicodin [Hydrocodone-Acetaminophen] Vomiting  Vioxx [Rofecoxib] Other (See Comments)  GI   CURRENT MEDICATIONS: Current Outpatient Medications: acetaminophen (TYLENOL) 500 MG tablet, Take 1,000 mg by mouth 3 (three) times a day Twice daily per patient., PRN Not Currently Taking aspirin 81 MG EC tablet, Take 1 tablet (81 mg total) by mouth at bedtime, Taking calcium carbonate-vitamin D3 (CALTRATE 600+D) 600 mg-10 mcg (400 unit) tablet, Take 0.5 tablets by mouth 2 (two) times daily, Taking cetirizine (ZYRTEC) 10 mg capsule, Take 1 capsule (10 mg total) by mouth once daily, Taking escitalopram oxalate (LEXAPRO) 10 MG tablet, Take 1 tablet (10 mg total) by mouth once daily, Taking estradioL (ESTRACE) 0.01 % (0.1 mg/gram) vaginal cream, Place 2 g vaginally twice a  week, Taking folic acid/multivit,iron,miner (MULTIVIT-IRON-MIN-FOLIC ACID ORAL), Take 1 tablet by mouth once daily, Taking gabapentin (NEURONTIN) 300 MG capsule, Take 1 capsule (300 mg total) by mouth 3 (three) times daily Phantom limb pain, Taking melatonin 3 mg tablet, Take 1 tablet (3 mg total) by mouth at bedtime, Taking multivitamin with iron (COMPLETE MULTIVITAMIN-MINERAL) tablet, Take 1 tablet by mouth, Taking multivitamin with minerals, EYE, (PRESERVISION AREDS 2) soft gel capsule, Take 1 capsule by mouth 2 (two) times daily, Taking nystatin (MYCOSTATIN) 100,000 unit/gram powder, Apply small amount topically twice daily for rash under breasts, PRN Not Currently Taking pantoprazole (PROTONIX) 40 MG DR tablet, Take 1 tablet (40 mg total) by mouth 2 (two) times daily. Please call to schedule an appt. Thanks!, Taking polyethylene glycol (MIRALAX) powder, Take 17 g by mouth once daily Mix in 4-8ounces of fluid prior to taking., Taking sennosides-docusate (SENOKOT-S) 8.6-50 mg tablet, Take 1 tablet by mouth 2 (two) times daily For constipation, Taking lactose-reduced food (ENSURE PLUS ORAL), Take 237 mLs by mouth 2 (two) times daily With breakfast and dinner  HPI   CLINICAL SUMMARY:  Patient post right AKA and working with prosthesis. 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 Able to do housework, improving  PAIN:  Are you having pain? Yes: NPRS scale: 5/10 Pain location: L knee Pain description: aching Aggravating factors: walking Relieving factors: rest  PRECAUTIONS: None  WEIGHT BEARING RESTRICTIONS No  FALLS:  Has patient fallen in last 6 months? No  LIVING ENVIRONMENT:   Lives with: lives with their spouse Lives in: House/apartment Stairs: No Has following equipment at home: Gilford Rile - 4 wheeled and Wheelchair (manual)  OCCUPATION: retired  PLOF: Requires assistive device for independence  PATIENT GOALS  improve standing tolerance/ pull up  pants/ ambulate with [place] safely.     OBJECTIVE:   PATIENT SURVEYS:  FOTO initial 46/ goal 34.    COGNITION:  Overall cognitive status: Within functional limits for tasks assessed     SENSATION: WFL  POSTURE: rounded shoulders, forward head, flexed trunk , and weight shift left  PALPATION: No tenderness along R distal residual limb.  Discussed phantom limb sensation.    LOWER EXTREMITY ROM:  B LE AROM WFL except L knee extension secondary to articulating spacer.  Did not assess L/R hip extension at this time.    LOWER EXTREMITY MMT:  MMT Right eval Left eval  Hip flexion 4/5 4/5  Hip extension    Hip abduction 4+/5 4+/5  Hip adduction 4+/5 4+/5  Hip internal rotation    Hip external rotation    Knee flexion N/A 4+/5  Knee extension N/A 4+/5  Ankle dorsiflexion  5/5  Ankle plantarflexion    Ankle inversion    Ankle eversion     (Blank rows = not tested)  GAIT: Distance walked: in gym/ //-bars Assistive device utilized: Environmental consultant - 2 wheeled Level of assistance: CGA Comments: Cuing for posture correction with mirror feedback.  Flexed posture/ heavy UE assist.     TODAY'S TREATMENT:  11/23/21  Subjective:  Pt. Had a great time at beach with family.  Pt. Did not don prosthetic leg or ambulate while on vacation.  Pt. Arrived to PT in w/c prior to donning prosthetic leg on blue mat table.  Pt. Has f/u with Dr. Sabra Heck on 11/21.  Pt. C/o L knee pain prior to PT tx. Session and increase discomfort with standing/walking.   Objective:    There.ex.:    Seated marching on blue mat table 10x.   Seated wt. Shifting/ posture correction.   STS from mat table with proper technique from varying heights to lessen need for UE assist.     Nustep L4 10 min. B UE/LE (seat #11-10).  Consistent cadence for muscle endurance.    Gait training:    Walking in //-bars forward/ backwards (5 laps) with B UE and CGA for safety/ verbal cuing.   Mirror feedback for posture correction.   Moderate verbal cuing for posture correction/ step pattern.     Ambulate in clinic (100 feet/ 80 feet) with RW and CGA with cuing to correct head position/ upright posture.  No LOB and consistent R hip flexion/ step length.    Amb. From Nustep to car with RW.   No LOB and good BOS but moderate cuing to correct posture.          PATIENT EDUCATION:  Education details: HEP/ gait and prosthetic training.  Person educated: Patient and Spouse Education method: Explanation, Demonstration, and Verbal cues Education comprehension: verbalized understanding, returned demonstration, and tactile cues required   HOME EXERCISE PROGRAM: Prone position hip stretches/ Standing marching at kitchen counter with w/c behind patient.      ASSESSMENT:  CLINICAL IMPRESSION: Pt. ambulates around PT clinic with use of prosthetic leg/ RW with consistent recip.   Persistent L knee pain with sit to stands and walking in clinic.  Pt. Motivated and doing better with sit to stands into RW with less assist and cuing.  Pt. Will benefit  from skilled PT services to improve standing tolerance/ gait independence and ADL.    OBJECTIVE IMPAIRMENTS Abnormal gait, decreased activity tolerance, decreased balance, decreased endurance, decreased mobility, difficulty walking, decreased ROM, decreased strength, decreased safety awareness, impaired flexibility, improper body mechanics, prosthetic dependency , and pain.   ACTIVITY LIMITATIONS carrying, lifting, standing, squatting, stairs, transfers, bed mobility, bathing, toileting, dressing, hygiene/grooming, and locomotion level  PARTICIPATION LIMITATIONS: cleaning, laundry, driving, shopping, community activity, and yard work  [place] Past/current experiences are also affecting patient's functional outcome.   REHAB POTENTIAL: Good  CLINICAL DECISION MAKING: Evolving/moderate complexity  EVALUATION COMPLEXITY: High   GOALS: Goals reviewed with patient?  Yes  SHORT TERM GOALS: Target date: 10/07/21 Pt. Able to don/doff R prosthetic leg independently to improve ADL/standing tolerance.  Baseline:  min. A to don/ doff prosthetic leg Goal status: Goal met   LONG TERM GOALS: Target date: 12/02/21  Pt. Will increase FOTO to 54 to improve functional mobility.  Baseline: initial FOTO 46 Goal status: On-going  2.  Pt. Able to tolerate standing 10 minutes while pulling up pants with mod. I safely while wearing prosthetic leg to improve daily activities.  Baseline:  limited standing tolerance Goal status: INITIAL  3.  Pt. Will ambulate 200 feet with use of SPC and mod. I to promote safety with household tasks/ walking into bathroom.   Baseline:  amb. With RW and min. A for safety.   Goal status: INITIAL   PLAN: PT FREQUENCY: 2x/week  PT DURATION: 12 weeks  PLANNED INTERVENTIONS: Therapeutic exercises, Therapeutic activity, Neuromuscular re-education, Balance training, Gait training, Patient/Family education, Self Care, Joint mobilization, Prosthetic training, DME instructions, and Manual therapy  PLAN FOR NEXT SESSION:  Progress gait/ step ups.   Pura Spice, PT, DPT # [id number] 11/23/2021, 8:57 PM

## 2021-11-25 ENCOUNTER — Ambulatory Visit: Payer: Medicare Other | Admitting: Physical Therapy

## 2021-11-25 DIAGNOSIS — M6281 Muscle weakness (generalized): Secondary | ICD-10-CM

## 2021-11-25 DIAGNOSIS — R269 Unspecified abnormalities of gait and mobility: Secondary | ICD-10-CM

## 2021-11-25 DIAGNOSIS — R293 Abnormal posture: Secondary | ICD-10-CM

## 2021-11-25 DIAGNOSIS — Z89611 Acquired absence of right leg above knee: Secondary | ICD-10-CM

## 2021-11-28 NOTE — Therapy (Signed)
OUTPATIENT PHYSICAL THERAPY LOWER EXTREMITY TREATMENT   Patient Name: Jasmine Morrow MRN: [id number] DOB:1945-03-17, 76 y.o., female Today's Date: 11/25/2021   PT End of Session - 11/28/21 1913     Visit Number 18    Number of Visits 24    Date for PT Re-Evaluation 12/02/21    PT Start Time 0944    PT Stop Time 1032    PT Time Calculation (min) 48 min    Activity Tolerance Patient tolerated treatment well             Past Medical History:  Diagnosis Date   Anemia    Anxiety    Asthma    seasonal    GERD (gastroesophageal reflux disease)    Hypertension    PONV (postoperative nausea and vomiting)    Seasonal allergies    Sleep apnea    does not uses CPAP machine anymore    Past Surgical History:  Procedure Laterality Date   APPENDECTOMY     CESAREAN SECTION     CHOLECYSTECTOMY     DILATION AND CURETTAGE OF UTERUS     ESOPHAGOGASTRODUODENOSCOPY N/A 02/22/2019   Procedure: ESOPHAGOGASTRODUODENOSCOPY (EGD);  Surgeon: Toledo, Benay Pike, MD;  Location: [facility];  Service: Gastroenterology;  Laterality: N/A;   FLEXIBLE SIGMOIDOSCOPY N/A 02/22/2019   Procedure: FLEXIBLE SIGMOIDOSCOPY;  Surgeon: Toledo, Benay Pike, MD;  Location: [facility];  Service: Gastroenterology;  Laterality: N/A;   HARDWARE REMOVAL Right 11/12/2019   Procedure: Right ankle hardware removal;  Surgeon: Hessie Knows, MD;  Location: [facility];  Service: Orthopedics;  Laterality: Right;   HARDWARE REMOVAL Right 04/17/2020   Procedure: HARDWARE REMOVAL FROM RIGHT ANKLE AND RIGHT KNEE; IMPLANT OF CEMENT SPACERS IN RIGHT KNEE;  Surgeon: Dereck Leep, MD;  Location: [facility];  Service: Orthopedics;  Laterality: Right;   JOINT REPLACEMENT     ORIF ANKLE FRACTURE Right 03/14/2019   Procedure: OPEN REDUCTION INTERNAL FIXATION (ORIF) ANKLE FRACTURE, MEDIAL MALLEOLUS;  Surgeon: Hessie Knows, MD;  Location: [facility];  Service: Orthopedics;  Laterality: Right;   ORIF ANKLE FRACTURE Right 02/08/2019    Procedure: OPEN REDUCTION INTERNAL FIXATION (ORIF) ANKLE FRACTURE, post malleolus;  Surgeon: Hessie Knows, MD;  Location: [facility];  Service: Orthopedics;  Laterality: Right;   REPLACEMENT TOTAL KNEE Bilateral 2008   2009   SCAR DEBRIDEMENT OF TOTAL KNEE  01/2020   SYNDESMOSIS REPAIR Right 02/08/2019   Procedure: SYNDESMOSIS REPAIR;  Surgeon: Hessie Knows, MD;  Location: [facility];  Service: Orthopedics;  Laterality: Right;   TEE WITHOUT CARDIOVERSION N/A 02/26/2019   Procedure: TRANSESOPHAGEAL ECHOCARDIOGRAM (TEE);  Surgeon: Corey Skains, MD;  Location: [facility];  Service: Cardiovascular;  Laterality: N/A;   TOTAL KNEE REVISION WITH SCAR DEBRIDEMENT/PATELLA REVISION WITH POLY EXCHANGE Right 01/24/2020   Procedure: TOTAL KNEE REVISION WITH SCAR DEBRIDEMENT/PATELLA REVISION WITH POLY EXCHANGE;  Surgeon: Dereck Leep, MD;  Location: [facility];  Service: Orthopedics;  Laterality: Right;   TUMOR REMOVAL     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   Patient Active Problem List   Diagnosis Date Noted   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[place]) 04/17/2020   Anxiety 04/17/2020   History of GI diverticular bleed 04/17/2020   Chest pain 04/17/2020   PAD (peripheral artery disease) ([place]) 04/05/2020   Ulcer of right leg ([place]) 04/05/2020   COPD (chronic obstructive pulmonary disease) ([place]) 04/05/2020   GERD (gastroesophageal reflux disease) 04/05/2020   Abscess of right knee 01/22/2020   Pressure injury of skin 02/28/2019  SOB (shortness of breath)    Gastrointestinal hemorrhage    Sepsis (HCC)    Symptomatic anemia 02/20/2019   Trimalleolar fracture of ankle, closed, right, initial encounter 02/07/2019   Multiple lung nodules 03/31/2014   Extrinsic asthma 03/27/2014   OSA on CPAP 03/27/2014    PCP: Miller, Mark F, MD  REFERRING PROVIDER: Miller, Mark F, MD  REFERRING DIAG: Above knee amputation of right lower extremity  THERAPY DIAG:  S/P AKA (above knee amputation) unilateral, right  (HCC)  Muscle weakness (generalized)  Gait difficulty  Abnormal posture  Rationale for Evaluation and Treatment Rehabilitation  ONSET DATE: 12/02/20  SUBJECTIVE:  EVALUATION  PERTINENT HISTORY: Patient Profile:   Jasmine Morrow is a 76 y.o. female Chief Complaint  Patient presents with  Visit Follow Up  Doing well.   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 Cardiac cath 10/2011 showed completely normal coronary arteries, however she did have mild left ventricular dysfunction with anterior wall motion abnormality. Left ventricular EF was 51%.  History of chickenpox  Hyperlipidemia  patient states she has never been diagnosed with this  Hypertension  Knee joint replacement by other means  Macular degeneration  Major depressive disorder, recurrent, mild (CMS-HCC)  Medicare annual wellness visit, initial 08/09/2019  7/21  Multiple thyroid nodules  Normal coronary arteries 09/06/2013  Normal coronary anatomy by cardiac catheterization 10/12/11  Osteoarthritis  Ovarian cyst  PONV (postoperative nausea and vomiting)  nausea  Postoperative urinary retention 12/08/2020  Rash on lips 12/21/2020  Seasonal allergies  Sinusitis, unspecified  Sleep apnea  On CPAP.  Slow transit constipation 12/10/2020  Trimalleolar fracture of ankle, closed, right, initial encounter 02/07/2019  Complicated by  staph infection 1/21, 4 weeks IV Ancef, Menz  Ulcer   Past Surgical History:  Procedure Laterality Date  SALPINGO OOPHORECTOMY Bilateral 1993  Right total knee arthroplasty 10/11/2006  Dr Hooten  Left total knee arthroplasty 10/12/2007  Dr Hooten  COLONOSCOPY 07/30/2012  internal hemorrhoids, diverticulosis  ANKLE ARTHODESIS W/ ARTHROSCOPY Right 02/08/2019  03/14/2019 second surgery  COLONOSCOPY 02/22/2019  Blood in entire colon/Diverticulosis - Presumed diverticular bleed. No repeat recommended per TKT.  EGD 02/22/2019  Gastritis/gastric ulcers/Hiatal hernia/Otherwise normal - no repeat recommended per TKT.  ORIF ANKLE FRACTURE Right 03/14/2019  Dr. Menz  REMOVAL HARDWARE ANKLE FOOT/TOES Right 11/12/2019  Dr. Menz  Right knee arthrotomy, irrigation and debridement of the right knee, polyethylene exchange 01/24/2020  Dr Hooten  Removal of hardware (plates and screws) from the right ankle with irrigation, debridement, and placement of Stimulan antibiotic beads 04/17/2020  Dr Hooten  Right knee arthrotomy, extensive irrigation debridement, removal of right total knee implants, and placement of antibiotic impregnated polymethylmethacrylate cement spacer 04/17/2020  Dr Hooten  AMPUTATION LEG ABOVE KNEE AKA Right 12/02/2020  Procedure: AMPUTATION, THIGH, THROUGH FEMUR, ANY LEVEL; Surgeon: Chun, Danielle Sunae, MD; Location: [facility]; Service: Orthopedics; Laterality: Right;  TRANSFER ADJACENT TISSUE LEG Right 12/02/2020  Procedure: ADJACENT TISSUE TRANSFER OR REARRANGEMENT, LEG;   DEFECT 10 SQ CM OR LESS; Surgeon: Chun, Danielle Sunae, MD; Location: [facility]; Service: Orthopedics; Laterality: Right;  ASPIRATION/INJECTION MAJOR JOINT/BURSA KNEE Left 12/02/2020  Procedure: ARTHROCENTESIS, ASPIRATION AND/OR INJECTION, MAJOR JOINT OR BURSA, KNEE; WITHOUT ULTRASOUND GUIDANCE; Surgeon: Chun, Danielle Sunae, MD; Location: [facility]; Service: Orthopedics; Laterality: Left;  REMOVAL  KNEE PROSTHESIS W/POSSIBLE PLACEMENT SPACER Left 01/18/2021  Procedure: REMOVAL OF PROSTHESIS, INCLUDING TOTAL KNEE PROSTHESIS, METHYLMETHACRYLATE WITH OR WITHOUT INSERTION OF SPACER, KNEE; Surgeon: Jiranek, William Arthur, MD; Location: [facility]; Service: Orthopedics; Laterality: Left;  INSERTION NON-BIODEGRADABLE DRUG DELIVERY IMPLANT Left 01/18/2021  Procedure: INSERTION, KNEE, bioresorbable, biodegradable, NON-BIODEGRADABLE DRUG DELIVERY IMPLANT; Surgeon: Jiranek, William Arthur, MD; Location: [facility]; Service: Orthopedics; Laterality: Left;  ABOVE KNEE LEG AMPUTATION  12/02/2020  APPENDECTOMY Est 1968  CESAREAN SECTION  CHOLECYSTECTOMY  FRACTURE SURGERY 1/21  HYSTERECTOMY partial  JOINT REPLACEMENT 2008 & 2009  TUBAL LIGATION 3/79   ALLERGIES: Allergies  Allergen Reactions  Singulair [Montelukast] Other (See Comments)  Gland swelling  Sulfa (Sulfonamide Antibiotics) Swelling  Oxycodone Dizziness and Other (See Comments)  Vancomycin Analogues Other (See Comments)  Ringing in ears  Avinza [Morphine] Itching  Darvocet A500 [Propoxyphene N-Acetaminophen] Nausea  Nickel Rash  Ultracet [Tramadol-Acetaminophen] Rash  Vicodin [Hydrocodone-Acetaminophen] Vomiting  Vioxx [Rofecoxib] Other (See Comments)  GI   CURRENT MEDICATIONS: Current Outpatient Medications: acetaminophen (TYLENOL) 500 MG tablet, Take 1,000 mg by mouth 3 (three) times a day Twice daily per patient., PRN Not Currently Taking aspirin 81 MG EC tablet, Take 1 tablet (81 mg total) by mouth at bedtime, Taking calcium carbonate-vitamin D3 (CALTRATE 600+D) 600 mg-10 mcg (400 unit) tablet, Take 0.5 tablets by mouth 2 (two) times daily, Taking cetirizine (ZYRTEC) 10 mg capsule, Take 1 capsule (10 mg total) by mouth once daily, Taking escitalopram oxalate (LEXAPRO) 10 MG tablet, Take 1 tablet (10 mg total) by mouth once daily, Taking estradioL (ESTRACE) 0.01 % (0.1 mg/gram) vaginal cream, Place 2 g vaginally twice a  week, Taking folic acid/multivit,iron,miner (MULTIVIT-IRON-MIN-FOLIC ACID ORAL), Take 1 tablet by mouth once daily, Taking gabapentin (NEURONTIN) 300 MG capsule, Take 1 capsule (300 mg total) by mouth 3 (three) times daily Phantom limb pain, Taking melatonin 3 mg tablet, Take 1 tablet (3 mg total) by mouth at bedtime, Taking multivitamin with iron (COMPLETE MULTIVITAMIN-MINERAL) tablet, Take 1 tablet by mouth, Taking multivitamin with minerals, EYE, (PRESERVISION AREDS 2) soft gel capsule, Take 1 capsule by mouth 2 (two) times daily, Taking nystatin (MYCOSTATIN) 100,000 unit/gram powder, Apply small amount topically twice daily for rash under breasts, PRN Not Currently Taking pantoprazole (PROTONIX) 40 MG DR tablet, Take 1 tablet (40 mg total) by mouth 2 (two) times daily. Please call to schedule an appt. Thanks!, Taking polyethylene glycol (MIRALAX) powder, Take 17 g by mouth once daily Mix in 4-8ounces of fluid prior to taking., Taking sennosides-docusate (SENOKOT-S) 8.6-50 mg tablet, Take 1 tablet by mouth 2 (two) times daily For constipation, Taking lactose-reduced food (ENSURE PLUS ORAL), Take 237 mLs by mouth 2 (two) times daily With breakfast and dinner  HPI   CLINICAL SUMMARY:  Patient post right AKA and working with prosthesis. 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 Able to do housework, improving  PAIN:  Are you having pain? Yes: NPRS scale: 5/10 Pain location: L knee Pain description: aching Aggravating factors: walking Relieving factors: rest  PRECAUTIONS: None  WEIGHT BEARING RESTRICTIONS No  FALLS:  Has patient fallen in last 6 months? No  LIVING ENVIRONMENT:   Lives with: lives with their spouse Lives in: House/apartment Stairs: No Has following equipment at home: Gilford Rile - 4 wheeled and Wheelchair (manual)  OCCUPATION: retired  PLOF: Requires assistive device for independence  PATIENT GOALS  improve standing tolerance/ pull up  pants/ ambulate with [place] safely.     OBJECTIVE:   PATIENT SURVEYS:  FOTO initial 46/ goal 35.    COGNITION:  Overall cognitive status: Within functional limits for tasks assessed     SENSATION: WFL  POSTURE: rounded shoulders, forward head, flexed trunk , and weight shift left  PALPATION: No tenderness along R distal residual limb.  Discussed phantom limb sensation.    LOWER EXTREMITY ROM:  B LE AROM WFL except L knee extension secondary to articulating spacer.  Did not assess L/R hip extension at this time.    LOWER EXTREMITY MMT:  MMT Right eval Left eval  Hip flexion 4/5 4/5  Hip extension    Hip abduction 4+/5 4+/5  Hip adduction 4+/5 4+/5  Hip internal rotation    Hip external rotation    Knee flexion N/A 4+/5  Knee extension N/A 4+/5  Ankle dorsiflexion  5/5  Ankle plantarflexion    Ankle inversion    Ankle eversion     (Blank rows = not tested)  GAIT: Distance walked: in gym/ //-bars Assistive device utilized: Environmental consultant - 2 wheeled Level of assistance: CGA Comments: Cuing for posture correction with mirror feedback.  Flexed posture/ heavy UE assist.     TODAY'S TREATMENT:  11/25/21  Subjective:  Pt. States L knee is hurting, esp. With transfers/ standing tasks.  Pt. Is not walking with prosthetic leg at home.    Objective:    There.ex.:    Seated marching on blue mat table 10x.   Sit to stands from blue mat into RW.   6" step ups at stairs with B UE assist on handrail.  Step to gait at stairs (4 stairs x 2)- marked fatigue with seated rest break taken.     Nustep L4 10 min. B UE/LE (seat #11-10).  Consistent cadence for muscle endurance.    Gait training:    Walking in //-bars forward/ backwards (5 laps) with B UE and CGA for safety/ verbal cuing.   Mirror feedback for posture correction.  Moderate verbal cuing for posture correction/ step pattern.     Ambulate in clinic (95 feet) with RW and CGA with cuing to correct head position/ upright  posture.  No LOB and consistent R hip flexion/ step length.  Good control of R prosthetic leg/ knee mechanism.    Amb. From Nustep to car with RW.   No LOB and good BOS but moderate cuing to correct posture.          PATIENT EDUCATION:  Education details: HEP/ gait and prosthetic training.  Person educated: Patient and Spouse Education method: Explanation, Demonstration, and Verbal cues Education comprehension: verbalized understanding, returned demonstration, and tactile cues required   HOME EXERCISE PROGRAM: Prone position hip stretches/ Standing marching at kitchen counter with w/c behind patient.      ASSESSMENT:  CLINICAL IMPRESSION: Pt. ambulates around PT clinic with use of prosthetic leg/ RW with consistent recip.  Marked fatigue with step ups/ walking in PT clinic and benefits from short seated rest break.   Persistent L knee pain with sit to stands and walking in clinic.  Pt. Motivated and doing better with sit to stands into RW with less assist and cuing.  Pt. Will benefit from  skilled PT services to improve standing tolerance/ gait independence and ADL.    OBJECTIVE IMPAIRMENTS Abnormal gait, decreased activity tolerance, decreased balance, decreased endurance, decreased mobility, difficulty walking, decreased ROM, decreased strength, decreased safety awareness, impaired flexibility, improper body mechanics, prosthetic dependency , and pain.   ACTIVITY LIMITATIONS carrying, lifting, standing, squatting, stairs, transfers, bed mobility, bathing, toileting, dressing, hygiene/grooming, and locomotion level  PARTICIPATION LIMITATIONS: cleaning, laundry, driving, shopping, community activity, and yard work  [place] Past/current experiences are also affecting patient's functional outcome.   REHAB POTENTIAL: Good  CLINICAL DECISION MAKING: Evolving/moderate complexity  EVALUATION COMPLEXITY: High   GOALS: Goals reviewed with patient? Yes  SHORT TERM GOALS: Target  date: 10/07/21 Pt. Able to don/doff R prosthetic leg independently to improve ADL/standing tolerance.  Baseline:  min. A to don/ doff prosthetic leg Goal status: Goal met   LONG TERM GOALS: Target date: 12/02/21  Pt. Will increase FOTO to 54 to improve functional mobility.  Baseline: initial FOTO 46 Goal status: On-going  2.  Pt. Able to tolerate standing 10 minutes while pulling up pants with mod. I safely while wearing prosthetic leg to improve daily activities.  Baseline:  limited standing tolerance Goal status: INITIAL  3.  Pt. Will ambulate 200 feet with use of SPC and mod. I to promote safety with household tasks/ walking into bathroom.   Baseline:  amb. With RW and min. A for safety.   Goal status: INITIAL   PLAN: PT FREQUENCY: 2x/week  PT DURATION: 12 weeks  PLANNED INTERVENTIONS: Therapeutic exercises, Therapeutic activity, Neuromuscular re-education, Balance training, Gait training, Patient/Family education, Self Care, Joint mobilization, Prosthetic training, DME instructions, and Manual therapy  PLAN FOR NEXT SESSION:  Progress gait/ step ups.   Pura Spice, PT, DPT # [phone] 11/28/2021, 7:15 PM

## 2021-11-29 ENCOUNTER — Encounter (INDEPENDENT_AMBULATORY_CARE_PROVIDER_SITE_OTHER): Payer: Self-pay

## 2021-11-30 ENCOUNTER — Ambulatory Visit: Payer: Medicare Other | Admitting: Physical Therapy

## 2021-11-30 DIAGNOSIS — R269 Unspecified abnormalities of gait and mobility: Secondary | ICD-10-CM

## 2021-11-30 DIAGNOSIS — Z89611 Acquired absence of right leg above knee: Secondary | ICD-10-CM | POA: Diagnosis not present

## 2021-11-30 DIAGNOSIS — M6281 Muscle weakness (generalized): Secondary | ICD-10-CM

## 2021-11-30 DIAGNOSIS — R293 Abnormal posture: Secondary | ICD-10-CM

## 2021-11-30 NOTE — Therapy (Signed)
OUTPATIENT PHYSICAL THERAPY LOWER EXTREMITY TREATMENT   Patient Name: Jasmine Morrow MRN: [id number] DOB:[birth year], 76 y.o., female Today's Date: 11/30/2021   PT End of Session - 11/30/21 1417     Visit Number 19    Number of Visits 24    Date for PT Re-Evaluation 12/02/21    PT Start Time 1114    PT Stop Time 1203    PT Time Calculation (min) 49 min    Activity Tolerance Patient tolerated treatment well             Past Medical History:  Diagnosis Date   Anemia    Anxiety    Asthma    seasonal    GERD (gastroesophageal reflux disease)    Hypertension    PONV (postoperative nausea and vomiting)    Seasonal allergies    Sleep apnea    does not uses CPAP machine anymore    Past Surgical History:  Procedure Laterality Date   APPENDECTOMY     CESAREAN SECTION     CHOLECYSTECTOMY     DILATION AND CURETTAGE OF UTERUS     ESOPHAGOGASTRODUODENOSCOPY N/A 02/22/2019   Procedure: ESOPHAGOGASTRODUODENOSCOPY (EGD);  Surgeon: Toledo, Benay Pike, MD;  Location: [facility];  Service: Gastroenterology;  Laterality: N/A;   FLEXIBLE SIGMOIDOSCOPY N/A 02/22/2019   Procedure: FLEXIBLE SIGMOIDOSCOPY;  Surgeon: Toledo, Benay Pike, MD;  Location: [facility];  Service: Gastroenterology;  Laterality: N/A;   HARDWARE REMOVAL Right 11/12/2019   Procedure: Right ankle hardware removal;  Surgeon: Hessie Knows, MD;  Location: [facility];  Service: Orthopedics;  Laterality: Right;   HARDWARE REMOVAL Right 04/17/2020   Procedure: HARDWARE REMOVAL FROM RIGHT ANKLE AND RIGHT KNEE; IMPLANT OF CEMENT SPACERS IN RIGHT KNEE;  Surgeon: Dereck Leep, MD;  Location: [facility];  Service: Orthopedics;  Laterality: Right;   JOINT REPLACEMENT     ORIF ANKLE FRACTURE Right 03/14/2019   Procedure: OPEN REDUCTION INTERNAL FIXATION (ORIF) ANKLE FRACTURE, MEDIAL MALLEOLUS;  Surgeon: Hessie Knows, MD;  Location: [facility];  Service: Orthopedics;  Laterality: Right;   ORIF ANKLE FRACTURE Right 02/08/2019    Procedure: OPEN REDUCTION INTERNAL FIXATION (ORIF) ANKLE FRACTURE, post malleolus;  Surgeon: Hessie Knows, MD;  Location: [facility];  Service: Orthopedics;  Laterality: Right;   REPLACEMENT TOTAL KNEE Bilateral 2008   2009   SCAR DEBRIDEMENT OF TOTAL KNEE  01/2020   SYNDESMOSIS REPAIR Right 02/08/2019   Procedure: SYNDESMOSIS REPAIR;  Surgeon: Hessie Knows, MD;  Location: [facility];  Service: Orthopedics;  Laterality: Right;   TEE WITHOUT CARDIOVERSION N/A 02/26/2019   Procedure: TRANSESOPHAGEAL ECHOCARDIOGRAM (TEE);  Surgeon: Corey Skains, MD;  Location: [facility];  Service: Cardiovascular;  Laterality: N/A;   TOTAL KNEE REVISION WITH SCAR DEBRIDEMENT/PATELLA REVISION WITH POLY EXCHANGE Right 01/24/2020   Procedure: TOTAL KNEE REVISION WITH SCAR DEBRIDEMENT/PATELLA REVISION WITH POLY EXCHANGE;  Surgeon: Dereck Leep, MD;  Location: [facility];  Service: Orthopedics;  Laterality: Right;   TUMOR REMOVAL     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   Patient Active Problem List   Diagnosis Date Noted   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[place]) 04/17/2020   Anxiety 04/17/2020   History of GI diverticular bleed 04/17/2020   Chest pain 04/17/2020   PAD (peripheral artery disease) ([place]) 04/05/2020   Ulcer of right leg ([place]) 04/05/2020   COPD (chronic obstructive pulmonary disease) ([place]) 04/05/2020   GERD (gastroesophageal reflux disease) 04/05/2020   Abscess of right knee 01/22/2020   Pressure injury of skin 02/28/2019  SOB (shortness of breath)    Gastrointestinal hemorrhage    Sepsis (HCC)    Symptomatic anemia 02/20/2019   Trimalleolar fracture of ankle, closed, right, initial encounter 02/07/2019   Multiple lung nodules 03/31/2014   Extrinsic asthma 03/27/2014   OSA on CPAP 03/27/2014    PCP: Miller, Mark F, MD  REFERRING PROVIDER: Miller, Mark F, MD  REFERRING DIAG: Above knee amputation of right lower extremity  THERAPY DIAG:  S/P AKA (above knee amputation) unilateral, right  (HCC)  Muscle weakness (generalized)  Gait difficulty  Abnormal posture  Rationale for Evaluation and Treatment Rehabilitation  ONSET DATE: 12/02/20  SUBJECTIVE:  EVALUATION  PERTINENT HISTORY: Patient Profile:   Lerlene Baldi is a 76 y.o. female Chief Complaint  Patient presents with  Visit Follow Up  Doing well.   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 Cardiac cath 10/2011 showed completely normal coronary arteries, however she did have mild left ventricular dysfunction with anterior wall motion abnormality. Left ventricular EF was 51%.  History of chickenpox  Hyperlipidemia  patient states she has never been diagnosed with this  Hypertension  Knee joint replacement by other means  Macular degeneration  Major depressive disorder, recurrent, mild (CMS-HCC)  Medicare annual wellness visit, initial 08/09/2019  7/21  Multiple thyroid nodules  Normal coronary arteries 09/06/2013  Normal coronary anatomy by cardiac catheterization 10/12/11  Osteoarthritis  Ovarian cyst  PONV (postoperative nausea and vomiting)  nausea  Postoperative urinary retention 12/08/2020  Rash on lips 12/21/2020  Seasonal allergies  Sinusitis, unspecified  Sleep apnea  On CPAP.  Slow transit constipation 12/10/2020  Trimalleolar fracture of ankle, closed, right, initial encounter 02/07/2019  Complicated by  staph infection 1/21, 4 weeks IV Ancef, Menz  Ulcer   Past Surgical History:  Procedure Laterality Date  SALPINGO OOPHORECTOMY Bilateral 1993  Right total knee arthroplasty 10/11/2006  Dr Hooten  Left total knee arthroplasty 10/12/2007  Dr Hooten  COLONOSCOPY 07/30/2012  internal hemorrhoids, diverticulosis  ANKLE ARTHODESIS W/ ARTHROSCOPY Right 02/08/2019  03/14/2019 second surgery  COLONOSCOPY 02/22/2019  Blood in entire colon/Diverticulosis - Presumed diverticular bleed. No repeat recommended per TKT.  EGD 02/22/2019  Gastritis/gastric ulcers/Hiatal hernia/Otherwise normal - no repeat recommended per TKT.  ORIF ANKLE FRACTURE Right 03/14/2019  Dr. Menz  REMOVAL HARDWARE ANKLE FOOT/TOES Right 11/12/2019  Dr. Menz  Right knee arthrotomy, irrigation and debridement of the right knee, polyethylene exchange 01/24/2020  Dr Hooten  Removal of hardware (plates and screws) from the right ankle with irrigation, debridement, and placement of Stimulan antibiotic beads 04/17/2020  Dr Hooten  Right knee arthrotomy, extensive irrigation debridement, removal of right total knee implants, and placement of antibiotic impregnated polymethylmethacrylate cement spacer 04/17/2020  Dr Hooten  AMPUTATION LEG ABOVE KNEE AKA Right 12/02/2020  Procedure: AMPUTATION, THIGH, THROUGH FEMUR, ANY LEVEL; Surgeon: Chun, Danielle Sunae, MD; Location: [facility]; Service: Orthopedics; Laterality: Right;  TRANSFER ADJACENT TISSUE LEG Right 12/02/2020  Procedure: ADJACENT TISSUE TRANSFER OR REARRANGEMENT, LEG;   DEFECT 10 SQ CM OR LESS; Surgeon: Chun, Danielle Sunae, MD; Location: [facility]; Service: Orthopedics; Laterality: Right;  ASPIRATION/INJECTION MAJOR JOINT/BURSA KNEE Left 12/02/2020  Procedure: ARTHROCENTESIS, ASPIRATION AND/OR INJECTION, MAJOR JOINT OR BURSA, KNEE; WITHOUT ULTRASOUND GUIDANCE; Surgeon: Chun, Danielle Sunae, MD; Location: [facility]; Service: Orthopedics; Laterality: Left;  REMOVAL  KNEE PROSTHESIS W/POSSIBLE PLACEMENT SPACER Left 01/18/2021  Procedure: REMOVAL OF PROSTHESIS, INCLUDING TOTAL KNEE PROSTHESIS, METHYLMETHACRYLATE WITH OR WITHOUT INSERTION OF SPACER, KNEE; Surgeon: Jiranek, William Arthur, MD; Location: [facility]; Service: Orthopedics; Laterality: Left;  INSERTION NON-BIODEGRADABLE DRUG DELIVERY IMPLANT Left 01/18/2021  Procedure: INSERTION, KNEE, bioresorbable, biodegradable, NON-BIODEGRADABLE DRUG DELIVERY IMPLANT; Surgeon: Jiranek, William Arthur, MD; Location: [facility]; Service: Orthopedics; Laterality: Left;  ABOVE KNEE LEG AMPUTATION  12/02/2020  APPENDECTOMY Est 1968  CESAREAN SECTION  CHOLECYSTECTOMY  FRACTURE SURGERY 1/21  HYSTERECTOMY partial  JOINT REPLACEMENT 2008 & 2009  TUBAL LIGATION 3/79   ALLERGIES: Allergies  Allergen Reactions  Singulair [Montelukast] Other (See Comments)  Gland swelling  Sulfa (Sulfonamide Antibiotics) Swelling  Oxycodone Dizziness and Other (See Comments)  Vancomycin Analogues Other (See Comments)  Ringing in ears  Avinza [Morphine] Itching  Darvocet A500 [Propoxyphene N-Acetaminophen] Nausea  Nickel Rash  Ultracet [Tramadol-Acetaminophen] Rash  Vicodin [Hydrocodone-Acetaminophen] Vomiting  Vioxx [Rofecoxib] Other (See Comments)  GI   CURRENT MEDICATIONS: Current Outpatient Medications: acetaminophen (TYLENOL) 500 MG tablet, Take 1,000 mg by mouth 3 (three) times a day Twice daily per patient., PRN Not Currently Taking aspirin 81 MG EC tablet, Take 1 tablet (81 mg total) by mouth at bedtime, Taking calcium carbonate-vitamin D3 (CALTRATE 600+D) 600 mg-10 mcg (400 unit) tablet, Take 0.5 tablets by mouth 2 (two) times daily, Taking cetirizine (ZYRTEC) 10 mg capsule, Take 1 capsule (10 mg total) by mouth once daily, Taking escitalopram oxalate (LEXAPRO) 10 MG tablet, Take 1 tablet (10 mg total) by mouth once daily, Taking estradioL (ESTRACE) 0.01 % (0.1 mg/gram) vaginal cream, Place 2 g vaginally twice a  week, Taking folic acid/multivit,iron,miner (MULTIVIT-IRON-MIN-FOLIC ACID ORAL), Take 1 tablet by mouth once daily, Taking gabapentin (NEURONTIN) 300 MG capsule, Take 1 capsule (300 mg total) by mouth 3 (three) times daily Phantom limb pain, Taking melatonin 3 mg tablet, Take 1 tablet (3 mg total) by mouth at bedtime, Taking multivitamin with iron (COMPLETE MULTIVITAMIN-MINERAL) tablet, Take 1 tablet by mouth, Taking multivitamin with minerals, EYE, (PRESERVISION AREDS 2) soft gel capsule, Take 1 capsule by mouth 2 (two) times daily, Taking nystatin (MYCOSTATIN) 100,000 unit/gram powder, Apply small amount topically twice daily for rash under breasts, PRN Not Currently Taking pantoprazole (PROTONIX) 40 MG DR tablet, Take 1 tablet (40 mg total) by mouth 2 (two) times daily. Please call to schedule an appt. Thanks!, Taking polyethylene glycol (MIRALAX) powder, Take 17 g by mouth once daily Mix in 4-8ounces of fluid prior to taking., Taking sennosides-docusate (SENOKOT-S) 8.6-50 mg tablet, Take 1 tablet by mouth 2 (two) times daily For constipation, Taking lactose-reduced food (ENSURE PLUS ORAL), Take 237 mLs by mouth 2 (two) times daily With breakfast and dinner  HPI   CLINICAL SUMMARY:  Patient post right AKA and working with prosthesis. 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 Able to do housework, improving  PAIN:  Are you having pain? Yes: NPRS scale: 5/10 Pain location: L knee Pain description: aching Aggravating factors: walking Relieving factors: rest  PRECAUTIONS: None  WEIGHT BEARING RESTRICTIONS No  FALLS:  Has patient fallen in last 6 months? No  LIVING ENVIRONMENT:   Lives with: lives with their spouse Lives in: House/apartment Stairs: No Has following equipment at home: Gilford Rile - 4 wheeled and Wheelchair (manual)  OCCUPATION: retired  PLOF: Requires assistive device for independence  PATIENT GOALS  improve standing tolerance/ pull up  pants/ ambulate with [place] safely.     OBJECTIVE:   PATIENT SURVEYS:  FOTO initial 46/ goal 40.    COGNITION:  Overall cognitive status: Within functional limits for tasks assessed     SENSATION: WFL  POSTURE: rounded shoulders, forward head, flexed trunk , and weight shift left  PALPATION: No tenderness along R distal residual limb.  Discussed phantom limb sensation.    LOWER EXTREMITY ROM:  B LE AROM WFL except L knee extension secondary to articulating spacer.  Did not assess L/R hip extension at this time.    LOWER EXTREMITY MMT:  MMT Right eval Left eval  Hip flexion 4/5 4/5  Hip extension    Hip abduction 4+/5 4+/5  Hip adduction 4+/5 4+/5  Hip internal rotation    Hip external rotation    Knee flexion N/A 4+/5  Knee extension N/A 4+/5  Ankle dorsiflexion  5/5  Ankle plantarflexion    Ankle inversion    Ankle eversion     (Blank rows = not tested)  GAIT: Distance walked: in gym/ //-bars Assistive device utilized: Environmental consultant - 2 wheeled Level of assistance: CGA Comments: Cuing for posture correction with mirror feedback.  Flexed posture/ heavy UE assist.     TODAY'S TREATMENT:  11/30/21  Subjective:  Pt. Is not walking with prosthetic leg at home.   Pt. C/o L knee discomfort with standing/ walking tasks and especially going from standing to sitting while controlling R prosthetic leg.    Objective:    There.ex.:    Supine donning of prosthetic leg to ensure proper fit.     Supine R hip marching/ SLR with light PT assist and management of prosthetic leg 10x each.  Supine red bolster bridging with prosthetic leg on 10x (difficulty).     Seated marching on blue mat table 10x.   Sit to stands from blue mat into RW.   Standing wt. Shifting in front of mirror with //-bars assist.  Blue ball (3rd) bilateral shoulder flexion 10x (CGA from PT)- balance difficulty with UE assist on //-bars.     Nustep L4 10 min. B UE/LE (seat #11-10).  Consistent cadence for  muscle endurance.    Gait training:    Walking in //-bars forward/ backwards (5 laps) with B UE and CGA for safety/ verbal cuing.   Mirror feedback for posture correction.  Moderate verbal cuing for posture correction/ step pattern.     Ambulate in clinic (135 feet) with RW and CGA with cuing to correct head position/ upright posture.  No LOB and consistent R hip flexion/ step length.  Good control of R prosthetic leg/ knee mechanism.    Amb. From Nustep to car with RW.   No LOB and good BOS but moderate cuing to correct posture.          PATIENT EDUCATION:  Education details: HEP/ gait and prosthetic training.  Person educated: Patient and Spouse Education method: Explanation, Demonstration, and Verbal cues Education comprehension: verbalized understanding, returned demonstration, and tactile cues required   HOME EXERCISE PROGRAM: Prone position hip stretches/ Standing marching at kitchen counter with w/c behind patient.      ASSESSMENT:  CLINICAL IMPRESSION: Pt. ambulates around PT clinic with use of prosthetic leg/ RW with  consistent recip.  Pt. Challenged with standing unassisted and esp.  With added bilateral shoulder flexion.  Persistent L knee pain with sit to stands and walking in clinic.  Pt. Motivated and doing better with sit to stands into RW with less assist and cuing.  Pt. Will benefit from skilled PT services to improve standing tolerance/ gait independence and ADL.    OBJECTIVE IMPAIRMENTS Abnormal gait, decreased activity tolerance, decreased balance, decreased endurance, decreased mobility, difficulty walking, decreased ROM, decreased strength, decreased safety awareness, impaired flexibility, improper body mechanics, prosthetic dependency , and pain.   ACTIVITY LIMITATIONS carrying, lifting, standing, squatting, stairs, transfers, bed mobility, bathing, toileting, dressing, hygiene/grooming, and locomotion level  PARTICIPATION LIMITATIONS: cleaning, laundry,  driving, shopping, community activity, and yard work  [place] Past/current experiences are also affecting patient's functional outcome.   REHAB POTENTIAL: Good  CLINICAL DECISION MAKING: Evolving/moderate complexity  EVALUATION COMPLEXITY: High   GOALS: Goals reviewed with patient? Yes  SHORT TERM GOALS: Target date: 10/07/21 Pt. Able to don/doff R prosthetic leg independently to improve ADL/standing tolerance.  Baseline:  min. A to don/ doff prosthetic leg Goal status: Goal met   LONG TERM GOALS: Target date: 12/02/21  Pt. Will increase FOTO to 54 to improve functional mobility.  Baseline: initial FOTO 46 Goal status: On-going  2.  Pt. Able to tolerate standing 10 minutes while pulling up pants with mod. I safely while wearing prosthetic leg to improve daily activities.  Baseline:  limited standing tolerance Goal status: INITIAL  3.  Pt. Will ambulate 200 feet with use of SPC and mod. I to promote safety with household tasks/ walking into bathroom.   Baseline:  amb. With RW and min. A for safety.   Goal status: INITIAL   PLAN: PT FREQUENCY: 2x/week  PT DURATION: 12 weeks  PLANNED INTERVENTIONS: Therapeutic exercises, Therapeutic activity, Neuromuscular re-education, Balance training, Gait training, Patient/Family education, Self Care, Joint mobilization, Prosthetic training, DME instructions, and Manual therapy  PLAN FOR NEXT SESSION:  Progress gait/ step ups. 10th visit progress note/RECERT  Pura Spice, PT, DPT # [id number] 11/30/2021, 2:19 PM

## 2021-12-02 ENCOUNTER — Ambulatory Visit: Payer: Medicare Other | Attending: Internal Medicine | Admitting: Physical Therapy

## 2021-12-02 DIAGNOSIS — Z89611 Acquired absence of right leg above knee: Secondary | ICD-10-CM | POA: Insufficient documentation

## 2021-12-02 DIAGNOSIS — R269 Unspecified abnormalities of gait and mobility: Secondary | ICD-10-CM | POA: Diagnosis present

## 2021-12-02 DIAGNOSIS — R293 Abnormal posture: Secondary | ICD-10-CM | POA: Diagnosis present

## 2021-12-02 DIAGNOSIS — M6281 Muscle weakness (generalized): Secondary | ICD-10-CM | POA: Diagnosis present

## 2021-12-02 NOTE — Therapy (Signed)
OUTPATIENT PHYSICAL THERAPY LOWER EXTREMITY TREATMENT/ RECERTIFICATION Physical Therapy Progress Note   Dates of reporting period  10/26/21  to  12/02/21   Patient Name: Jasmine Morrow MRN: [id number] DOB:06/21/1945, 76 y.o., female Today's Date: 12/02/2021   PT End of Session - 12/02/21 1539     Visit Number 20    Number of Visits 58    Date for PT Re-Evaluation 01/27/22    PT Start Time 1517    PT Stop Time 1607    PT Time Calculation (min) 50 min    Activity Tolerance Patient tolerated treatment well             Past Medical History:  Diagnosis Date   Anemia    Anxiety    Asthma    seasonal    GERD (gastroesophageal reflux disease)    Hypertension    PONV (postoperative nausea and vomiting)    Seasonal allergies    Sleep apnea    does not uses CPAP machine anymore    Past Surgical History:  Procedure Laterality Date   APPENDECTOMY     CESAREAN SECTION     CHOLECYSTECTOMY     DILATION AND CURETTAGE OF UTERUS     ESOPHAGOGASTRODUODENOSCOPY N/A 02/22/2019   Procedure: ESOPHAGOGASTRODUODENOSCOPY (EGD);  Surgeon: Toledo, Benay Pike, MD;  Location: [facility];  Service: Gastroenterology;  Laterality: N/A;   FLEXIBLE SIGMOIDOSCOPY N/A 02/22/2019   Procedure: FLEXIBLE SIGMOIDOSCOPY;  Surgeon: Toledo, Benay Pike, MD;  Location: [facility];  Service: Gastroenterology;  Laterality: N/A;   HARDWARE REMOVAL Right 11/12/2019   Procedure: Right ankle hardware removal;  Surgeon: Hessie Knows, MD;  Location: [facility];  Service: Orthopedics;  Laterality: Right;   HARDWARE REMOVAL Right 04/17/2020   Procedure: HARDWARE REMOVAL FROM RIGHT ANKLE AND RIGHT KNEE; IMPLANT OF CEMENT SPACERS IN RIGHT KNEE;  Surgeon: Dereck Leep, MD;  Location: [facility];  Service: Orthopedics;  Laterality: Right;   JOINT REPLACEMENT     ORIF ANKLE FRACTURE Right 03/14/2019   Procedure: OPEN REDUCTION INTERNAL FIXATION (ORIF) ANKLE FRACTURE, MEDIAL MALLEOLUS;  Surgeon: Hessie Knows, MD;   Location: [facility];  Service: Orthopedics;  Laterality: Right;   ORIF ANKLE FRACTURE Right 02/08/2019   Procedure: OPEN REDUCTION INTERNAL FIXATION (ORIF) ANKLE FRACTURE, post malleolus;  Surgeon: Hessie Knows, MD;  Location: [facility];  Service: Orthopedics;  Laterality: Right;   REPLACEMENT TOTAL KNEE Bilateral 2008   2009   SCAR DEBRIDEMENT OF TOTAL KNEE  01/2020   SYNDESMOSIS REPAIR Right 02/08/2019   Procedure: SYNDESMOSIS REPAIR;  Surgeon: Hessie Knows, MD;  Location: [facility];  Service: Orthopedics;  Laterality: Right;   TEE WITHOUT CARDIOVERSION N/A 02/26/2019   Procedure: TRANSESOPHAGEAL ECHOCARDIOGRAM (TEE);  Surgeon: Corey Skains, MD;  Location: [facility];  Service: Cardiovascular;  Laterality: N/A;   TOTAL KNEE REVISION WITH SCAR DEBRIDEMENT/PATELLA REVISION WITH POLY EXCHANGE Right 01/24/2020   Procedure: TOTAL KNEE REVISION WITH SCAR DEBRIDEMENT/PATELLA REVISION WITH POLY EXCHANGE;  Surgeon: Dereck Leep, MD;  Location: [facility];  Service: Orthopedics;  Laterality: Right;   TUMOR REMOVAL     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   Patient Active Problem List   Diagnosis Date Noted   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[place]) 04/17/2020   Anxiety 04/17/2020   History of GI diverticular bleed 04/17/2020   Chest pain 04/17/2020   PAD (peripheral artery disease) ([place]) 04/05/2020   Ulcer of right leg ([place]) 04/05/2020   COPD (chronic obstructive pulmonary disease) ([place]) 04/05/2020   GERD (gastroesophageal  reflux disease) 04/05/2020   Abscess of right knee 01/22/2020   Pressure injury of skin 02/28/2019   SOB (shortness of breath)    Gastrointestinal hemorrhage    Sepsis ([place])    Symptomatic anemia 02/20/2019   Trimalleolar fracture of ankle, closed, right, initial encounter 02/07/2019   Multiple lung nodules 03/31/2014   Extrinsic asthma 03/27/2014   OSA on CPAP 03/27/2014    PCP: Rusty Aus, MD  REFERRING PROVIDER: Rusty Aus, MD  REFERRING DIAG: Above knee  amputation of right lower extremity  THERAPY DIAG:  S/P AKA (above knee amputation) unilateral, right (HCC)  Muscle weakness (generalized)  Gait difficulty  Abnormal posture  Rationale for Evaluation and Treatment Rehabilitation  ONSET DATE: 12/02/20  SUBJECTIVE:  EVALUATION  PERTINENT HISTORY: Patient Profile:   Takera Rayl is a 76 y.o. female Chief Complaint  Patient presents with  Visit Follow Up  Doing well.   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 Cardiac cath 10/2011 showed completely normal coronary arteries, however she did have mild left ventricular dysfunction with anterior wall motion abnormality. Left ventricular EF was 51%.  History of chickenpox  Hyperlipidemia  patient states she has never been diagnosed with this  Hypertension  Knee joint replacement by other means  Macular degeneration  Major depressive disorder, recurrent, mild (CMS-HCC)  Medicare annual wellness visit, initial 08/09/2019  7/21  Multiple thyroid nodules  Normal coronary arteries 09/06/2013  Normal coronary anatomy by cardiac catheterization 10/12/11  Osteoarthritis  Ovarian cyst  PONV (postoperative nausea and vomiting)  nausea  Postoperative urinary retention 12/08/2020  Rash on lips 12/21/2020  Seasonal allergies  Sinusitis, unspecified  Sleep apnea  On CPAP.  Slow transit constipation  12/10/2020  Trimalleolar fracture of ankle, closed, right, initial encounter 77/41/2878  Complicated by staph infection 1/21, 4 weeks IV Ancef, Menz  Ulcer   Past Surgical History:  Procedure Laterality Date  SALPINGO OOPHORECTOMY Bilateral 1993  Right total knee arthroplasty 10/11/2006  Dr Marry Guan  Left total knee arthroplasty 10/12/2007  Dr Marry Guan  COLONOSCOPY 07/30/2012  internal hemorrhoids, diverticulosis  ANKLE ARTHODESIS W/ ARTHROSCOPY Right 02/08/2019  03/14/2019 second surgery  COLONOSCOPY 02/22/2019  Blood in entire colon/Diverticulosis - Presumed diverticular bleed. No repeat recommended per TKT.  EGD 02/22/2019  Gastritis/gastric ulcers/Hiatal hernia/Otherwise normal - no repeat recommended per TKT.  ORIF ANKLE FRACTURE Right 03/14/2019  Dr. Rudene Christians  REMOVAL HARDWARE ANKLE FOOT/TOES Right 11/12/2019  Dr. Rudene Christians  Right knee arthrotomy, irrigation and debridement of the right knee, polyethylene exchange 01/24/2020  Dr Marry Guan  Removal of hardware (plates and screws) from the right ankle with irrigation, debridement, and placement of Stimulan antibiotic beads 04/17/2020  Dr Marry Guan  Right knee arthrotomy, extensive irrigation debridement, removal of right total knee implants, and placement of antibiotic impregnated polymethylmethacrylate cement spacer 04/17/2020  Dr Marry Guan  AMPUTATION LEG ABOVE KNEE AKA Right 12/02/2020  Procedure: AMPUTATION, THIGH, THROUGH FEMUR, ANY LEVEL; Surgeon: Lyndle Herrlich, MD; Location: [place];  Service: Orthopedics; Laterality: Right;  TRANSFER ADJACENT TISSUE LEG Right 12/02/2020  Procedure: ADJACENT TISSUE TRANSFER OR REARRANGEMENT, LEG; DEFECT 10 SQ CM OR LESS; Surgeon: Lyndle Herrlich, MD; Location: [place]; Service: Orthopedics; Laterality: Right;  ASPIRATION/INJECTION MAJOR JOINT/BURSA KNEE Left 12/02/2020  Procedure: ARTHROCENTESIS, ASPIRATION AND/OR INJECTION, MAJOR JOINT OR BURSA, KNEE; WITHOUT ULTRASOUND GUIDANCE;  Surgeon: Lyndle Herrlich, MD; Location: [place]; Service: Orthopedics; Laterality: Left;  REMOVAL KNEE PROSTHESIS W/POSSIBLE PLACEMENT SPACER Left 01/18/2021  Procedure: REMOVAL OF PROSTHESIS, INCLUDING TOTAL KNEE PROSTHESIS, METHYLMETHACRYLATE WITH OR WITHOUT INSERTION OF SPACER, KNEE; Surgeon: Ihor Dow, MD; Location: [place]; Service: Orthopedics; Laterality: Left;  INSERTION NON-BIODEGRADABLE DRUG DELIVERY IMPLANT Left 01/18/2021  Procedure: INSERTION, KNEE, bioresorbable, biodegradable, NON-BIODEGRADABLE DRUG DELIVERY IMPLANT; Surgeon: Ihor Dow, MD; Location: [place]; Service: Orthopedics; Laterality: Left;  ABOVE KNEE LEG AMPUTATION  12/02/2020  APPENDECTOMY Est 1968  CESAREAN SECTION  CHOLECYSTECTOMY  FRACTURE SURGERY 1/21  HYSTERECTOMY partial  JOINT REPLACEMENT 2008 & 2009  TUBAL LIGATION 3/79   ALLERGIES: Allergies  Allergen Reactions  Singulair [Montelukast] Other (See Comments)  Gland swelling  Sulfa (Sulfonamide Antibiotics) Swelling  Oxycodone Dizziness and Other (See Comments)  Vancomycin Analogues Other (See Comments)  Ringing in ears  Avinza [Morphine] Itching  Darvocet A500 [Propoxyphene N-Acetaminophen] Nausea  Nickel Rash  Ultracet [Tramadol-Acetaminophen] Rash  Vicodin [Hydrocodone-Acetaminophen] Vomiting  Vioxx [Rofecoxib] Other (See Comments)  GI   CURRENT MEDICATIONS: Current Outpatient Medications: acetaminophen (TYLENOL) 500 MG tablet, Take 1,000 mg by mouth 3 (three) times a day Twice daily per patient., PRN Not Currently Taking aspirin 81 MG EC tablet, Take 1 tablet (81 mg total) by mouth at bedtime, Taking calcium carbonate-vitamin D3 (CALTRATE 600+D) 600 mg-10 mcg (400 unit) tablet, Take 0.5 tablets by mouth 2 (two) times daily, Taking cetirizine (ZYRTEC) 10 mg capsule, Take 1 capsule (10 mg total) by mouth once daily, Taking escitalopram oxalate (LEXAPRO) 10 MG tablet, Take 1 tablet (10 mg total) by  mouth once daily, Taking estradioL (ESTRACE) 0.01 % (0.1 mg/gram) vaginal cream, Place 2 g vaginally twice a week, Taking folic acid/multivit,iron,miner (MULTIVIT-IRON-MIN-FOLIC ACID ORAL), Take 1 tablet by mouth once daily, Taking gabapentin (NEURONTIN) 300 MG capsule, Take 1 capsule (300 mg total) by mouth 3 (three) times daily Phantom limb pain, Taking melatonin 3 mg tablet, Take 1 tablet (3 mg total) by mouth at bedtime, Taking multivitamin with iron (COMPLETE MULTIVITAMIN-MINERAL) tablet, Take 1 tablet by mouth, Taking multivitamin with minerals, EYE, (PRESERVISION AREDS 2) soft gel capsule, Take 1 capsule by mouth 2 (two) times daily, Taking nystatin (MYCOSTATIN) 100,000 unit/gram powder, Apply small amount topically twice daily for rash under breasts, PRN Not Currently Taking pantoprazole (PROTONIX) 40 MG DR tablet, Take 1 tablet (40 mg total) by mouth 2 (two) times daily. Please call to schedule an appt. Thanks!, Taking polyethylene glycol (MIRALAX) powder, Take 17 g by mouth once daily Mix in 4-8ounces of fluid prior to taking., Taking sennosides-docusate (SENOKOT-S) 8.6-50 mg tablet, Take 1 tablet by mouth 2 (two) times daily For constipation, Taking lactose-reduced food (ENSURE PLUS ORAL), Take 237 mLs by mouth 2 (two) times daily With breakfast and dinner  HPI   CLINICAL SUMMARY:  Patient post right AKA and working with prosthesis. 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 Able to do housework, improving  PAIN:  Are you having pain? Yes: NPRS scale: 5/10 Pain location: L knee Pain description: aching Aggravating factors: walking Relieving factors: rest  PRECAUTIONS: None  WEIGHT BEARING RESTRICTIONS No  FALLS:  Has patient fallen in last 6 months? No  LIVING ENVIRONMENT: Lives with: lives with their spouse Lives in: House/apartment Stairs: No Has following equipment at home: Gilford Rile - 4 wheeled and Wheelchair (manual)  OCCUPATION:  retired  PLOF: Requires assistive device for independence  PATIENT GOALS  improve standing tolerance/ pull up pants/ ambulate with [place] safely.     OBJECTIVE:   PATIENT SURVEYS:  FOTO initial 46/ goal 47.   11/2: 50  COGNITION:  Overall cognitive status: Within functional limits for tasks assessed     SENSATION: WFL  POSTURE: rounded shoulders, forward head, flexed trunk , and weight shift left  PALPATION: No tenderness along R distal residual limb.  Discussed phantom limb sensation.    LOWER EXTREMITY ROM:  B LE AROM WFL except L knee extension secondary to articulating spacer.  Did not assess L/R hip extension at this time.    LOWER EXTREMITY MMT:  MMT Right eval Left eval  Hip flexion 4/5 4/5  Hip extension    Hip abduction 4+/5 4+/5  Hip adduction 4+/5 4+/5  Hip internal rotation    Hip external rotation    Knee flexion N/A 4+/5  Knee extension N/A 4+/5  Ankle dorsiflexion  5/5  Ankle plantarflexion    Ankle inversion    Ankle eversion     (Blank rows = not tested)  GAIT: Distance walked: in gym/ //-bars Assistive device utilized: Environmental consultant - 2 wheeled Level of assistance: CGA Comments: Cuing for posture correction with mirror feedback.  Flexed posture/ heavy UE assist.     TODAY'S TREATMENT:  12/02/21  Subjective:  Pt. Is not walking with prosthetic leg at home.   Pt. Arrived to PT in w/c and carrying prosthetic leg.  Pts. Husband reports difficulty donning prosthetic leg over gel liner/ ply sock today.   Objective:    There.ex.:    Supine position on blue mat table to don prosthetic leg to ensure proper fit.  PT inspected R residual limb (maybe slightly swollen as compared to previous tx. sessions).  Prosthetic leg would not fit until ply sock removed.  PT discussed residual limb shrinker.     Seated marching on blue mat table 10x.   Sit to stands from blue mat into RW.    Reviewed HEP    Neuro.mm.:    Standing wt. Shifting in front of mirror with  //-bars assist.  Focused on no UE assist at //-bars.     Standing upright posture correction with mirror feedback.  Increase lumbar flexion when pt. Attempts to balance without UE assist.     Gait training:    Ambulate in clinic (125 feet) with RW and CGA with cuing to correct head position/ upright posture.  No LOB and consistent R hip flexion/ step length.  Good control of R prosthetic leg/ knee mechanism.   No stairs today.  Amb. From Nustep to blue mat table to reassess R residual limb after tx. Session.    BP: 148/70.        PATIENT EDUCATION:  Education details: HEP/ gait and prosthetic training.  Person educated: Patient and Spouse Education method: Explanation, Demonstration, and Verbal cues Education comprehension: verbalized understanding, returned demonstration, and tactile cues required   HOME EXERCISE PROGRAM: Prone position hip stretches/ Standing marching at kitchen counter with w/c behind patient.      ASSESSMENT:  CLINICAL IMPRESSION: Pt. ambulates around PT clinic with use of prosthetic leg/ RW with consistent recip.  Pt. Challenged with standing unassisted.  Marked increase in lumbar flexion when attempted to stand upright without UE assist.  Persistent L knee pain with sit to stands and walking in clinic.  Pts. R residual limb is not allowing prosthetic leg to fit properly today.  See updated PT goals.   Pt. Will benefit from skilled PT services to improve standing tolerance/ gait independence and ADL.    OBJECTIVE IMPAIRMENTS Abnormal gait, decreased activity tolerance, decreased balance, decreased endurance, decreased mobility, difficulty walking, decreased ROM, decreased strength, decreased safety awareness, impaired flexibility, improper body mechanics, prosthetic dependency , and pain.   ACTIVITY LIMITATIONS carrying, lifting, standing, squatting, stairs, transfers, bed mobility, bathing, toileting, dressing, hygiene/grooming, and locomotion  level  PARTICIPATION LIMITATIONS: cleaning, laundry, driving, shopping, community activity, and yard work  [place] Past/current experiences are also affecting patient's functional outcome.   REHAB POTENTIAL: Good  CLINICAL DECISION MAKING: Evolving/moderate complexity  EVALUATION COMPLEXITY: High   GOALS: Goals reviewed with patient? Yes  SHORT TERM GOALS: Target date: 10/07/21 Pt. Able to don/doff R prosthetic leg independently to improve ADL/standing tolerance.  Baseline:  min. A to don/ doff prosthetic leg Goal status: Goal met   LONG TERM GOALS: Target date: 01/27/22  Pt. Will increase FOTO to 54 to improve functional mobility.  Baseline: initial FOTO 46.  11/2: 50 Goal status: Partially met  2.  Pt. Able to tolerate standing 10 minutes while pulling up pants with mod. I safely while wearing prosthetic leg to improve daily activities.  Baseline:  limited standing tolerance Goal status: Not met  3.  Pt. Will ambulate 200 feet with use of SPC and mod. I to promote safety with household tasks/ walking into bathroom.   Baseline:  amb. With RW and min. A for safety.  11/2:   Goal status: Not met   PLAN: PT FREQUENCY: 2x/week  PT DURATION: 8 weeks  PLANNED INTERVENTIONS: Therapeutic exercises, Therapeutic activity, Neuromuscular re-education, Balance training, Gait training, Patient/Family education, Self Care, Joint mobilization, Prosthetic training, DME instructions, and Manual therapy  PLAN FOR NEXT SESSION:  Progress gait/ step ups.  Recheck fit of prosthetic leg  Pura Spice, PT, DPT # [phone] 12/03/2021, 1:54 PM

## 2021-12-07 ENCOUNTER — Ambulatory Visit: Payer: Medicare Other | Admitting: Physical Therapy

## 2021-12-07 DIAGNOSIS — Z89611 Acquired absence of right leg above knee: Secondary | ICD-10-CM | POA: Diagnosis not present

## 2021-12-07 DIAGNOSIS — R293 Abnormal posture: Secondary | ICD-10-CM

## 2021-12-07 DIAGNOSIS — R269 Unspecified abnormalities of gait and mobility: Secondary | ICD-10-CM

## 2021-12-07 DIAGNOSIS — M6281 Muscle weakness (generalized): Secondary | ICD-10-CM

## 2021-12-08 NOTE — Therapy (Signed)
OUTPATIENT PHYSICAL THERAPY LOWER EXTREMITY TREATMENT   Patient Name: Jasmine Morrow MRN: [id number] DOB:07-Sep-1945, 76 y.o., female Today's Date: 12/07/2021   PT End of Session - 12/02/21 1539     Visit Number 20    Number of Visits 13    Date for PT Re-Evaluation 01/27/22    PT Start Time 1517    PT Stop Time 1607    PT Time Calculation (min) 50 min    Activity Tolerance Patient tolerated treatment well             Past Medical History:  Diagnosis Date   Anemia    Anxiety    Asthma    seasonal    GERD (gastroesophageal reflux disease)    Hypertension    PONV (postoperative nausea and vomiting)    Seasonal allergies    Sleep apnea    does not uses CPAP machine anymore    Past Surgical History:  Procedure Laterality Date   APPENDECTOMY     CESAREAN SECTION     CHOLECYSTECTOMY     DILATION AND CURETTAGE OF UTERUS     ESOPHAGOGASTRODUODENOSCOPY N/A 02/22/2019   Procedure: ESOPHAGOGASTRODUODENOSCOPY (EGD);  Surgeon: Toledo, Benay Pike, MD;  Location: [facility];  Service: Gastroenterology;  Laterality: N/A;   FLEXIBLE SIGMOIDOSCOPY N/A 02/22/2019   Procedure: FLEXIBLE SIGMOIDOSCOPY;  Surgeon: Toledo, Benay Pike, MD;  Location: [facility];  Service: Gastroenterology;  Laterality: N/A;   HARDWARE REMOVAL Right 11/12/2019   Procedure: Right ankle hardware removal;  Surgeon: Hessie Knows, MD;  Location: [facility];  Service: Orthopedics;  Laterality: Right;   HARDWARE REMOVAL Right 04/17/2020   Procedure: HARDWARE REMOVAL FROM RIGHT ANKLE AND RIGHT KNEE; IMPLANT OF CEMENT SPACERS IN RIGHT KNEE;  Surgeon: Dereck Leep, MD;  Location: [facility];  Service: Orthopedics;  Laterality: Right;   JOINT REPLACEMENT     ORIF ANKLE FRACTURE Right 03/14/2019   Procedure: OPEN REDUCTION INTERNAL FIXATION (ORIF) ANKLE FRACTURE, MEDIAL MALLEOLUS;  Surgeon: Hessie Knows, MD;  Location: [facility];  Service: Orthopedics;  Laterality: Right;   ORIF ANKLE FRACTURE Right 02/08/2019    Procedure: OPEN REDUCTION INTERNAL FIXATION (ORIF) ANKLE FRACTURE, post malleolus;  Surgeon: Hessie Knows, MD;  Location: [facility];  Service: Orthopedics;  Laterality: Right;   REPLACEMENT TOTAL KNEE Bilateral 2008   2009   SCAR DEBRIDEMENT OF TOTAL KNEE  01/2020   SYNDESMOSIS REPAIR Right 02/08/2019   Procedure: SYNDESMOSIS REPAIR;  Surgeon: Hessie Knows, MD;  Location: [facility];  Service: Orthopedics;  Laterality: Right;   TEE WITHOUT CARDIOVERSION N/A 02/26/2019   Procedure: TRANSESOPHAGEAL ECHOCARDIOGRAM (TEE);  Surgeon: Corey Skains, MD;  Location: [facility];  Service: Cardiovascular;  Laterality: N/A;   TOTAL KNEE REVISION WITH SCAR DEBRIDEMENT/PATELLA REVISION WITH POLY EXCHANGE Right 01/24/2020   Procedure: TOTAL KNEE REVISION WITH SCAR DEBRIDEMENT/PATELLA REVISION WITH POLY EXCHANGE;  Surgeon: Dereck Leep, MD;  Location: [facility];  Service: Orthopedics;  Laterality: Right;   TUMOR REMOVAL     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   Patient Active Problem List   Diagnosis Date Noted   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[place]) 04/17/2020   Anxiety 04/17/2020   History of GI diverticular bleed 04/17/2020   Chest pain 04/17/2020   PAD (peripheral artery disease) ([place]) 04/05/2020   Ulcer of right leg ([place]) 04/05/2020   COPD (chronic obstructive pulmonary disease) ([place]) 04/05/2020   GERD (gastroesophageal reflux disease) 04/05/2020   Abscess of right knee 01/22/2020   Pressure injury of skin 02/28/2019  SOB (shortness of breath)    Gastrointestinal hemorrhage    Sepsis (HCC)    Symptomatic anemia 02/20/2019   Trimalleolar fracture of ankle, closed, right, initial encounter 02/07/2019   Multiple lung nodules 03/31/2014   Extrinsic asthma 03/27/2014   OSA on CPAP 03/27/2014    PCP: Miller, Mark F, MD  REFERRING PROVIDER: Miller, Mark F, MD  REFERRING DIAG: Above knee amputation of right lower extremity  THERAPY DIAG:  S/P AKA (above knee amputation) unilateral, right  (HCC)  Muscle weakness (generalized)  Gait difficulty  Abnormal posture  Rationale for Evaluation and Treatment Rehabilitation  ONSET DATE: 12/02/20  SUBJECTIVE:  EVALUATION  PERTINENT HISTORY: Patient Profile:   Jasmine Morrow is a 76 y.o. female Chief Complaint  Patient presents with  Visit Follow Up  Doing well.   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 Cardiac cath 10/2011 showed completely normal coronary arteries, however she did have mild left ventricular dysfunction with anterior wall motion abnormality. Left ventricular EF was 51%.  History of chickenpox  Hyperlipidemia  patient states she has never been diagnosed with this  Hypertension  Knee joint replacement by other means  Macular degeneration  Major depressive disorder, recurrent, mild (CMS-HCC)  Medicare annual wellness visit, initial 08/09/2019  7/21  Multiple thyroid nodules  Normal coronary arteries 09/06/2013  Normal coronary anatomy by cardiac catheterization 10/12/11  Osteoarthritis  Ovarian cyst  PONV (postoperative nausea and vomiting)  nausea  Postoperative urinary retention 12/08/2020  Rash on lips 12/21/2020  Seasonal allergies  Sinusitis, unspecified  Sleep apnea  On CPAP.  Slow transit constipation 12/10/2020  Trimalleolar fracture of ankle, closed, right, initial encounter 02/07/2019  Complicated by  staph infection 1/21, 4 weeks IV Ancef, Menz  Ulcer   Past Surgical History:  Procedure Laterality Date  SALPINGO OOPHORECTOMY Bilateral 1993  Right total knee arthroplasty 10/11/2006  Dr Hooten  Left total knee arthroplasty 10/12/2007  Dr Hooten  COLONOSCOPY 07/30/2012  internal hemorrhoids, diverticulosis  ANKLE ARTHODESIS W/ ARTHROSCOPY Right 02/08/2019  03/14/2019 second surgery  COLONOSCOPY 02/22/2019  Blood in entire colon/Diverticulosis - Presumed diverticular bleed. No repeat recommended per TKT.  EGD 02/22/2019  Gastritis/gastric ulcers/Hiatal hernia/Otherwise normal - no repeat recommended per TKT.  ORIF ANKLE FRACTURE Right 03/14/2019  Dr. Menz  REMOVAL HARDWARE ANKLE FOOT/TOES Right 11/12/2019  Dr. Menz  Right knee arthrotomy, irrigation and debridement of the right knee, polyethylene exchange 01/24/2020  Dr Hooten  Removal of hardware (plates and screws) from the right ankle with irrigation, debridement, and placement of Stimulan antibiotic beads 04/17/2020  Dr Hooten  Right knee arthrotomy, extensive irrigation debridement, removal of right total knee implants, and placement of antibiotic impregnated polymethylmethacrylate cement spacer 04/17/2020  Dr Hooten  AMPUTATION LEG ABOVE KNEE AKA Right 12/02/2020  Procedure: AMPUTATION, THIGH, THROUGH FEMUR, ANY LEVEL; Surgeon: Chun, Danielle Sunae, MD; Location: [facility]; Service: Orthopedics; Laterality: Right;  TRANSFER ADJACENT TISSUE LEG Right 12/02/2020  Procedure: ADJACENT TISSUE TRANSFER OR REARRANGEMENT, LEG;   DEFECT 10 SQ CM OR LESS; Surgeon: Chun, Danielle Sunae, MD; Location: [facility]; Service: Orthopedics; Laterality: Right;  ASPIRATION/INJECTION MAJOR JOINT/BURSA KNEE Left 12/02/2020  Procedure: ARTHROCENTESIS, ASPIRATION AND/OR INJECTION, MAJOR JOINT OR BURSA, KNEE; WITHOUT ULTRASOUND GUIDANCE; Surgeon: Chun, Danielle Sunae, MD; Location: [facility]; Service: Orthopedics; Laterality: Left;  REMOVAL  KNEE PROSTHESIS W/POSSIBLE PLACEMENT SPACER Left 01/18/2021  Procedure: REMOVAL OF PROSTHESIS, INCLUDING TOTAL KNEE PROSTHESIS, METHYLMETHACRYLATE WITH OR WITHOUT INSERTION OF SPACER, KNEE; Surgeon: Jiranek, William Arthur, MD; Location: [facility]; Service: Orthopedics; Laterality: Left;  INSERTION NON-BIODEGRADABLE DRUG DELIVERY IMPLANT Left 01/18/2021  Procedure: INSERTION, KNEE, bioresorbable, biodegradable, NON-BIODEGRADABLE DRUG DELIVERY IMPLANT; Surgeon: Jiranek, William Arthur, MD; Location: [facility]; Service: Orthopedics; Laterality: Left;  ABOVE KNEE LEG AMPUTATION  12/02/2020  APPENDECTOMY Est 1968  CESAREAN SECTION  CHOLECYSTECTOMY  FRACTURE SURGERY 1/21  HYSTERECTOMY partial  JOINT REPLACEMENT 2008 & 2009  TUBAL LIGATION 3/79   ALLERGIES: Allergies  Allergen Reactions  Singulair [Montelukast] Other (See Comments)  Gland swelling  Sulfa (Sulfonamide Antibiotics) Swelling  Oxycodone Dizziness and Other (See Comments)  Vancomycin Analogues Other (See Comments)  Ringing in ears  Avinza [Morphine] Itching  Darvocet A500 [Propoxyphene N-Acetaminophen] Nausea  Nickel Rash  Ultracet [Tramadol-Acetaminophen] Rash  Vicodin [Hydrocodone-Acetaminophen] Vomiting  Vioxx [Rofecoxib] Other (See Comments)  GI   CURRENT MEDICATIONS: Current Outpatient Medications: acetaminophen (TYLENOL) 500 MG tablet, Take 1,000 mg by mouth 3 (three) times a day Twice daily per patient., PRN Not Currently Taking aspirin 81 MG EC tablet, Take 1 tablet (81 mg total) by mouth at bedtime, Taking calcium carbonate-vitamin D3 (CALTRATE 600+D) 600 mg-10 mcg (400 unit) tablet, Take 0.5 tablets by mouth 2 (two) times daily, Taking cetirizine (ZYRTEC) 10 mg capsule, Take 1 capsule (10 mg total) by mouth once daily, Taking escitalopram oxalate (LEXAPRO) 10 MG tablet, Take 1 tablet (10 mg total) by mouth once daily, Taking estradioL (ESTRACE) 0.01 % (0.1 mg/gram) vaginal cream, Place 2 g vaginally twice a  week, Taking folic acid/multivit,iron,miner (MULTIVIT-IRON-MIN-FOLIC ACID ORAL), Take 1 tablet by mouth once daily, Taking gabapentin (NEURONTIN) 300 MG capsule, Take 1 capsule (300 mg total) by mouth 3 (three) times daily Phantom limb pain, Taking melatonin 3 mg tablet, Take 1 tablet (3 mg total) by mouth at bedtime, Taking multivitamin with iron (COMPLETE MULTIVITAMIN-MINERAL) tablet, Take 1 tablet by mouth, Taking multivitamin with minerals, EYE, (PRESERVISION AREDS 2) soft gel capsule, Take 1 capsule by mouth 2 (two) times daily, Taking nystatin (MYCOSTATIN) 100,000 unit/gram powder, Apply small amount topically twice daily for rash under breasts, PRN Not Currently Taking pantoprazole (PROTONIX) 40 MG DR tablet, Take 1 tablet (40 mg total) by mouth 2 (two) times daily. Please call to schedule an appt. Thanks!, Taking polyethylene glycol (MIRALAX) powder, Take 17 g by mouth once daily Mix in 4-8ounces of fluid prior to taking., Taking sennosides-docusate (SENOKOT-S) 8.6-50 mg tablet, Take 1 tablet by mouth 2 (two) times daily For constipation, Taking lactose-reduced food (ENSURE PLUS ORAL), Take 237 mLs by mouth 2 (two) times daily With breakfast and dinner  HPI   CLINICAL SUMMARY:  Patient post right AKA and working with prosthesis. 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 Able to do housework, improving  PAIN:  Are you having pain? Yes: NPRS scale: 5/10 Pain location: L knee Pain description: aching Aggravating factors: walking Relieving factors: rest  PRECAUTIONS: None  WEIGHT BEARING RESTRICTIONS No  FALLS:  Has patient fallen in last 6 months? No  LIVING ENVIRONMENT:   Lives with: lives with their spouse Lives in: House/apartment Stairs: No Has following equipment at home: Gilford Rile - 4 wheeled and Wheelchair (manual)  OCCUPATION: retired  PLOF: Requires assistive device for independence  PATIENT GOALS  improve standing tolerance/ pull up  pants/ ambulate with [place] safely.     OBJECTIVE:   PATIENT SURVEYS:  FOTO initial 46/ goal 30.   11/2: 50  COGNITION:  Overall cognitive status: Within functional limits for tasks assessed     SENSATION: WFL  POSTURE: rounded shoulders, forward head, flexed trunk , and weight shift left  PALPATION: No tenderness along R distal residual limb.  Discussed phantom limb sensation.    LOWER EXTREMITY ROM:  B LE AROM WFL except L knee extension secondary to articulating spacer.  Did not assess L/R hip extension at this time.    LOWER EXTREMITY MMT:  MMT Right eval Left eval  Hip flexion 4/5 4/5  Hip extension    Hip abduction 4+/5 4+/5  Hip adduction 4+/5 4+/5  Hip internal rotation    Hip external rotation    Knee flexion N/A 4+/5  Knee extension N/A 4+/5  Ankle dorsiflexion  5/5  Ankle plantarflexion    Ankle inversion    Ankle eversion     (Blank rows = not tested)  GAIT: Distance walked: in gym/ //-bars Assistive device utilized: Environmental consultant - 2 wheeled Level of assistance: CGA Comments: Cuing for posture correction with mirror feedback.  Flexed posture/ heavy UE assist.     TODAY'S TREATMENT:  12/07/21  Subjective:  Pt. Arrived to PT in w/c and carrying prosthetic leg.  Pt. Prosthetic leg is fitting much better today as compared to last tx. Session.  Pt. Contacted Staci Righter to discuss getting second gel liner and possibly shrinker socks to prevent swelling in residual limb again.    Objective:    There.ex.:    Pt. Transferred from w/c to blue mat to don prosthetic leg.  Improved fit today.  Sit to stands from blue mat into RW.      Seated marching on blue mat table 10x.  Discuss posture correction.     Lateral walking in //-bars with cuing to increase step length (2 laps).  B UE assist and cuing to correct flexed posture.     Gait training:    Ambulate in clinic (85 feet/ 95 feet) with RW and CGA with cuing to correct head position/ upright posture.  No LOB and  consistent R hip flexion/ step length.  Good control of R prosthetic leg/ knee mechanism.      Ascend/ descend 4 stairs with B UE assist and step to gait (CGA/min. A) for safety.  Cuing to stand upright/ decrease forward flexed posture.   Amb. From [place] to car after tx. session.        PATIENT EDUCATION:  Education details: HEP/ gait and prosthetic training.  Person educated: Patient and Spouse Education method: Explanation, Demonstration, and Verbal cues Education comprehension: verbalized understanding, returned demonstration, and tactile cues required   HOME EXERCISE PROGRAM: Prone position hip stretches/ Standing marching at kitchen counter with w/c behind patient.      ASSESSMENT:  CLINICAL IMPRESSION: Pt. ambulates around PT clinic with use of prosthetic leg/ RW with consistent recip.  Persistent L knee pain with sit to stands and walking in clinic.  Pts. Prosthetic limb fitting much better today and allowing pt. To amb./ complete stairs.  Pt. Will benefit from skilled PT services to improve standing tolerance/ gait independence  and ADL.    OBJECTIVE IMPAIRMENTS Abnormal gait, decreased activity tolerance, decreased balance, decreased endurance, decreased mobility, difficulty walking, decreased ROM, decreased strength, decreased safety awareness, impaired flexibility, improper body mechanics, prosthetic dependency , and pain.   ACTIVITY LIMITATIONS carrying, lifting, standing, squatting, stairs, transfers, bed mobility, bathing, toileting, dressing, hygiene/grooming, and locomotion level  PARTICIPATION LIMITATIONS: cleaning, laundry, driving, shopping, community activity, and yard work  [place] Past/current experiences are also affecting patient's functional outcome.   REHAB POTENTIAL: Good  CLINICAL DECISION MAKING: Evolving/moderate complexity  EVALUATION COMPLEXITY: High   GOALS: Goals reviewed with patient? Yes  SHORT TERM GOALS: Target date: 10/07/21 Pt.  Able to don/doff R prosthetic leg independently to improve ADL/standing tolerance.  Baseline:  min. A to don/ doff prosthetic leg Goal status: Goal met   LONG TERM GOALS: Target date: 01/27/22  Pt. Will increase FOTO to 54 to improve functional mobility.  Baseline: initial FOTO 46.  11/2: 50 Goal status: Partially met  2.  Pt. Able to tolerate standing 10 minutes while pulling up pants with mod. I safely while wearing prosthetic leg to improve daily activities.  Baseline:  limited standing tolerance Goal status: Not met  3.  Pt. Will ambulate 200 feet with use of SPC and mod. I to promote safety with household tasks/ walking into bathroom.   Baseline:  amb. With RW and min. A for safety.  11/2:   Goal status: Not met   PLAN: PT FREQUENCY: 2x/week  PT DURATION: 8 weeks  PLANNED INTERVENTIONS: Therapeutic exercises, Therapeutic activity, Neuromuscular re-education, Balance training, Gait training, Patient/Family education, Self Care, Joint mobilization, Prosthetic training, DME instructions, and Manual therapy  PLAN FOR NEXT SESSION:  Progress gait/ step ups.  Check use of forearm crutch in //-bars next tx.   Pura Spice, PT, DPT # [phone] 12/08/2021, 11:58 AM

## 2021-12-09 ENCOUNTER — Encounter: Payer: Medicare Other | Admitting: Physical Therapy

## 2021-12-10 ENCOUNTER — Ambulatory Visit: Payer: Medicare Other | Admitting: Physical Therapy

## 2021-12-10 DIAGNOSIS — R293 Abnormal posture: Secondary | ICD-10-CM

## 2021-12-10 DIAGNOSIS — R269 Unspecified abnormalities of gait and mobility: Secondary | ICD-10-CM

## 2021-12-10 DIAGNOSIS — Z89611 Acquired absence of right leg above knee: Secondary | ICD-10-CM

## 2021-12-10 DIAGNOSIS — M6281 Muscle weakness (generalized): Secondary | ICD-10-CM

## 2021-12-13 ENCOUNTER — Ambulatory Visit: Payer: Medicare Other | Admitting: Physical Therapy

## 2021-12-16 ENCOUNTER — Ambulatory Visit: Payer: Medicare Other | Admitting: Physical Therapy

## 2021-12-16 DIAGNOSIS — R293 Abnormal posture: Secondary | ICD-10-CM

## 2021-12-16 DIAGNOSIS — R269 Unspecified abnormalities of gait and mobility: Secondary | ICD-10-CM

## 2021-12-16 DIAGNOSIS — Z89611 Acquired absence of right leg above knee: Secondary | ICD-10-CM

## 2021-12-16 DIAGNOSIS — M6281 Muscle weakness (generalized): Secondary | ICD-10-CM

## 2021-12-18 NOTE — Therapy (Signed)
OUTPATIENT PHYSICAL THERAPY LOWER EXTREMITY TREATMENT   Patient Name: Jasmine Morrow MRN: [id number] DOB:[birth year], 76 y.o., female Today's Date: 12/16/2021   PT End of Session - 12/18/21 1758     Visit Number 23    Number of Visits 36    Date for PT Re-Evaluation 01/27/22    PT Start Time 1037    PT Stop Time 1130    PT Time Calculation (min) 53 min    Activity Tolerance Patient tolerated treatment well             Past Medical History:  Diagnosis Date   Anemia    Anxiety    Asthma    seasonal    GERD (gastroesophageal reflux disease)    Hypertension    PONV (postoperative nausea and vomiting)    Seasonal allergies    Sleep apnea    does not uses CPAP machine anymore    Past Surgical History:  Procedure Laterality Date   APPENDECTOMY     CESAREAN SECTION     CHOLECYSTECTOMY     DILATION AND CURETTAGE OF UTERUS     ESOPHAGOGASTRODUODENOSCOPY N/A 02/22/2019   Procedure: ESOPHAGOGASTRODUODENOSCOPY (EGD);  Surgeon: Toledo, Benay Pike, MD;  Location: [facility];  Service: Gastroenterology;  Laterality: N/A;   FLEXIBLE SIGMOIDOSCOPY N/A 02/22/2019   Procedure: FLEXIBLE SIGMOIDOSCOPY;  Surgeon: Toledo, Benay Pike, MD;  Location: [facility];  Service: Gastroenterology;  Laterality: N/A;   HARDWARE REMOVAL Right 11/12/2019   Procedure: Right ankle hardware removal;  Surgeon: Hessie Knows, MD;  Location: [facility];  Service: Orthopedics;  Laterality: Right;   HARDWARE REMOVAL Right 04/17/2020   Procedure: HARDWARE REMOVAL FROM RIGHT ANKLE AND RIGHT KNEE; IMPLANT OF CEMENT SPACERS IN RIGHT KNEE;  Surgeon: Dereck Leep, MD;  Location: [facility];  Service: Orthopedics;  Laterality: Right;   JOINT REPLACEMENT     ORIF ANKLE FRACTURE Right 03/14/2019   Procedure: OPEN REDUCTION INTERNAL FIXATION (ORIF) ANKLE FRACTURE, MEDIAL MALLEOLUS;  Surgeon: Hessie Knows, MD;  Location: [facility];  Service: Orthopedics;  Laterality: Right;   ORIF ANKLE FRACTURE Right 02/08/2019    Procedure: OPEN REDUCTION INTERNAL FIXATION (ORIF) ANKLE FRACTURE, post malleolus;  Surgeon: Hessie Knows, MD;  Location: [facility];  Service: Orthopedics;  Laterality: Right;   REPLACEMENT TOTAL KNEE Bilateral 2008   2009   SCAR DEBRIDEMENT OF TOTAL KNEE  01/2020   SYNDESMOSIS REPAIR Right 02/08/2019   Procedure: SYNDESMOSIS REPAIR;  Surgeon: Hessie Knows, MD;  Location: [facility];  Service: Orthopedics;  Laterality: Right;   TEE WITHOUT CARDIOVERSION N/A 02/26/2019   Procedure: TRANSESOPHAGEAL ECHOCARDIOGRAM (TEE);  Surgeon: Corey Skains, MD;  Location: [facility];  Service: Cardiovascular;  Laterality: N/A;   TOTAL KNEE REVISION WITH SCAR DEBRIDEMENT/PATELLA REVISION WITH POLY EXCHANGE Right 01/24/2020   Procedure: TOTAL KNEE REVISION WITH SCAR DEBRIDEMENT/PATELLA REVISION WITH POLY EXCHANGE;  Surgeon: Dereck Leep, MD;  Location: [facility];  Service: Orthopedics;  Laterality: Right;   TUMOR REMOVAL     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   Patient Active Problem List   Diagnosis Date Noted   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[place]) 04/17/2020   Anxiety 04/17/2020   History of GI diverticular bleed 04/17/2020   Chest pain 04/17/2020   PAD (peripheral artery disease) ([place]) 04/05/2020   Ulcer of right leg ([place]) 04/05/2020   COPD (chronic obstructive pulmonary disease) ([place]) 04/05/2020   GERD (gastroesophageal reflux disease) 04/05/2020   Abscess of right knee 01/22/2020   Pressure injury of skin 02/28/2019  SOB (shortness of breath)    Gastrointestinal hemorrhage    Sepsis (HCC)    Symptomatic anemia 02/20/2019   Trimalleolar fracture of ankle, closed, right, initial encounter 02/07/2019   Multiple lung nodules 03/31/2014   Extrinsic asthma 03/27/2014   OSA on CPAP 03/27/2014    PCP: Miller, Mark F, MD  REFERRING PROVIDER: Miller, Mark F, MD  REFERRING DIAG: Above knee amputation of right lower extremity  THERAPY DIAG:  S/P AKA (above knee amputation) unilateral, right  (HCC)  Muscle weakness (generalized)  Gait difficulty  Abnormal posture  Rationale for Evaluation and Treatment Rehabilitation  ONSET DATE: 12/02/20  SUBJECTIVE:  EVALUATION  PERTINENT HISTORY: Patient Profile:   Jasmine Morrow is a 76 y.o. female Chief Complaint  Patient presents with  Visit Follow Up  Doing well.   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 Cardiac cath 10/2011 showed completely normal coronary arteries, however she did have mild left ventricular dysfunction with anterior wall motion abnormality. Left ventricular EF was 51%.  History of chickenpox  Hyperlipidemia  patient states she has never been diagnosed with this  Hypertension  Knee joint replacement by other means  Macular degeneration  Major depressive disorder, recurrent, mild (CMS-HCC)  Medicare annual wellness visit, initial 08/09/2019  7/21  Multiple thyroid nodules  Normal coronary arteries 09/06/2013  Normal coronary anatomy by cardiac catheterization 10/12/11  Osteoarthritis  Ovarian cyst  PONV (postoperative nausea and vomiting)  nausea  Postoperative urinary retention 12/08/2020  Rash on lips 12/21/2020  Seasonal allergies  Sinusitis, unspecified  Sleep apnea  On CPAP.  Slow transit constipation 12/10/2020  Trimalleolar fracture of ankle, closed, right, initial encounter 02/07/2019  Complicated by  staph infection 1/21, 4 weeks IV Ancef, Menz  Ulcer   Past Surgical History:  Procedure Laterality Date  SALPINGO OOPHORECTOMY Bilateral 1993  Right total knee arthroplasty 10/11/2006  Dr Hooten  Left total knee arthroplasty 10/12/2007  Dr Hooten  COLONOSCOPY 07/30/2012  internal hemorrhoids, diverticulosis  ANKLE ARTHODESIS W/ ARTHROSCOPY Right 02/08/2019  03/14/2019 second surgery  COLONOSCOPY 02/22/2019  Blood in entire colon/Diverticulosis - Presumed diverticular bleed. No repeat recommended per TKT.  EGD 02/22/2019  Gastritis/gastric ulcers/Hiatal hernia/Otherwise normal - no repeat recommended per TKT.  ORIF ANKLE FRACTURE Right 03/14/2019  Dr. Menz  REMOVAL HARDWARE ANKLE FOOT/TOES Right 11/12/2019  Dr. Menz  Right knee arthrotomy, irrigation and debridement of the right knee, polyethylene exchange 01/24/2020  Dr Hooten  Removal of hardware (plates and screws) from the right ankle with irrigation, debridement, and placement of Stimulan antibiotic beads 04/17/2020  Dr Hooten  Right knee arthrotomy, extensive irrigation debridement, removal of right total knee implants, and placement of antibiotic impregnated polymethylmethacrylate cement spacer 04/17/2020  Dr Hooten  AMPUTATION LEG ABOVE KNEE AKA Right 12/02/2020  Procedure: AMPUTATION, THIGH, THROUGH FEMUR, ANY LEVEL; Surgeon: Chun, Danielle Sunae, MD; Location: [facility]; Service: Orthopedics; Laterality: Right;  TRANSFER ADJACENT TISSUE LEG Right 12/02/2020  Procedure: ADJACENT TISSUE TRANSFER OR REARRANGEMENT, LEG;   DEFECT 10 SQ CM OR LESS; Surgeon: Chun, Danielle Sunae, MD; Location: [facility]; Service: Orthopedics; Laterality: Right;  ASPIRATION/INJECTION MAJOR JOINT/BURSA KNEE Left 12/02/2020  Procedure: ARTHROCENTESIS, ASPIRATION AND/OR INJECTION, MAJOR JOINT OR BURSA, KNEE; WITHOUT ULTRASOUND GUIDANCE; Surgeon: Chun, Danielle Sunae, MD; Location: [facility]; Service: Orthopedics; Laterality: Left;  REMOVAL  KNEE PROSTHESIS W/POSSIBLE PLACEMENT SPACER Left 01/18/2021  Procedure: REMOVAL OF PROSTHESIS, INCLUDING TOTAL KNEE PROSTHESIS, METHYLMETHACRYLATE WITH OR WITHOUT INSERTION OF SPACER, KNEE; Surgeon: Jiranek, William Arthur, MD; Location: [facility]; Service: Orthopedics; Laterality: Left;  INSERTION NON-BIODEGRADABLE DRUG DELIVERY IMPLANT Left 01/18/2021  Procedure: INSERTION, KNEE, bioresorbable, biodegradable, NON-BIODEGRADABLE DRUG DELIVERY IMPLANT; Surgeon: Jiranek, William Arthur, MD; Location: [facility]; Service: Orthopedics; Laterality: Left;  ABOVE KNEE LEG AMPUTATION  12/02/2020  APPENDECTOMY Est 1968  CESAREAN SECTION  CHOLECYSTECTOMY  FRACTURE SURGERY 1/21  HYSTERECTOMY partial  JOINT REPLACEMENT 2008 & 2009  TUBAL LIGATION 3/79   ALLERGIES: Allergies  Allergen Reactions  Singulair [Montelukast] Other (See Comments)  Gland swelling  Sulfa (Sulfonamide Antibiotics) Swelling  Oxycodone Dizziness and Other (See Comments)  Vancomycin Analogues Other (See Comments)  Ringing in ears  Avinza [Morphine] Itching  Darvocet A500 [Propoxyphene N-Acetaminophen] Nausea  Nickel Rash  Ultracet [Tramadol-Acetaminophen] Rash  Vicodin [Hydrocodone-Acetaminophen] Vomiting  Vioxx [Rofecoxib] Other (See Comments)  GI   CURRENT MEDICATIONS: Current Outpatient Medications: acetaminophen (TYLENOL) 500 MG tablet, Take 1,000 mg by mouth 3 (three) times a day Twice daily per patient., PRN Not Currently Taking aspirin 81 MG EC tablet, Take 1 tablet (81 mg total) by mouth at bedtime, Taking calcium carbonate-vitamin D3 (CALTRATE 600+D) 600 mg-10 mcg (400 unit) tablet, Take 0.5 tablets by mouth 2 (two) times daily, Taking cetirizine (ZYRTEC) 10 mg capsule, Take 1 capsule (10 mg total) by mouth once daily, Taking escitalopram oxalate (LEXAPRO) 10 MG tablet, Take 1 tablet (10 mg total) by mouth once daily, Taking estradioL (ESTRACE) 0.01 % (0.1 mg/gram) vaginal cream, Place 2 g vaginally twice a  week, Taking folic acid/multivit,iron,miner (MULTIVIT-IRON-MIN-FOLIC ACID ORAL), Take 1 tablet by mouth once daily, Taking gabapentin (NEURONTIN) 300 MG capsule, Take 1 capsule (300 mg total) by mouth 3 (three) times daily Phantom limb pain, Taking melatonin 3 mg tablet, Take 1 tablet (3 mg total) by mouth at bedtime, Taking multivitamin with iron (COMPLETE MULTIVITAMIN-MINERAL) tablet, Take 1 tablet by mouth, Taking multivitamin with minerals, EYE, (PRESERVISION AREDS 2) soft gel capsule, Take 1 capsule by mouth 2 (two) times daily, Taking nystatin (MYCOSTATIN) 100,000 unit/gram powder, Apply small amount topically twice daily for rash under breasts, PRN Not Currently Taking pantoprazole (PROTONIX) 40 MG DR tablet, Take 1 tablet (40 mg total) by mouth 2 (two) times daily. Please call to schedule an appt. Thanks!, Taking polyethylene glycol (MIRALAX) powder, Take 17 g by mouth once daily Mix in 4-8ounces of fluid prior to taking., Taking sennosides-docusate (SENOKOT-S) 8.6-50 mg tablet, Take 1 tablet by mouth 2 (two) times daily For constipation, Taking lactose-reduced food (ENSURE PLUS ORAL), Take 237 mLs by mouth 2 (two) times daily With breakfast and dinner  HPI   CLINICAL SUMMARY:  Patient post right AKA and working with prosthesis. 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 Able to do housework, improving  PAIN:  Are you having pain? Yes: NPRS scale: 5/10 Pain location: L knee Pain description: aching Aggravating factors: walking Relieving factors: rest  PRECAUTIONS: None  WEIGHT BEARING RESTRICTIONS No  FALLS:  Has patient fallen in last 6 months? No  LIVING ENVIRONMENT:   Lives with: lives with their spouse Lives in: House/apartment Stairs: No Has following equipment at home: Gilford Rile - 4 wheeled and Wheelchair (manual)  OCCUPATION: retired  PLOF: Requires assistive device for independence  PATIENT GOALS  improve standing tolerance/ pull up  pants/ ambulate with [place] safely.     OBJECTIVE:   PATIENT SURVEYS:  FOTO initial 46/ goal 58.   11/2: 50  COGNITION:  Overall cognitive status: Within functional limits for tasks assessed     SENSATION: WFL  POSTURE: rounded shoulders, forward head, flexed trunk , and weight shift left  PALPATION: No tenderness along R distal residual limb.  Discussed phantom limb sensation.    LOWER EXTREMITY ROM:  B LE AROM WFL except L knee extension secondary to articulating spacer.  Did not assess L/R hip extension at this time.    LOWER EXTREMITY MMT:  MMT Right eval Left eval  Hip flexion 4/5 4/5  Hip extension    Hip abduction 4+/5 4+/5  Hip adduction 4+/5 4+/5  Hip internal rotation    Hip external rotation    Knee flexion N/A 4+/5  Knee extension N/A 4+/5  Ankle dorsiflexion  5/5  Ankle plantarflexion    Ankle inversion    Ankle eversion     (Blank rows = not tested)  GAIT: Distance walked: in gym/ //-bars Assistive device utilized: Environmental consultant - 2 wheeled Level of assistance: CGA Comments: Cuing for posture correction with mirror feedback.  Flexed posture/ heavy UE assist.     TODAY'S TREATMENT:  12/16/21  Subjective:  Pt. Arrived to PT in w/c and carrying prosthetic leg.  Pt. Reports no new complaints.  No ambulation at home due to no CGA for safety.    Objective:    There.ex.:    Pt. Transferred from w/c to blue mat to don prosthetic leg.        Nustep L4 10 min. With B UE/LE.  Use of R toe clip to improve placement of R foot.    Step ups/ down on 1st 6" step at stairs 10x.  Heavy UE assist for safety/ balance.     Reviewed HEP/ discussed pts. Standing/ walking at home.  PT discussed ascending stairs at family members house to allow pt. To enter for party.   Pts. Husband concerned pt. Will not be able to reach B handrails and ascend stairs safely.    Gait training:    Ambulate in clinic/hallway (190 feet/ 100 feet) with RW and CGA with cuing to correct head  position/ upright posture.  No LOB and consistent R hip flexion/ step length.  Good control of R prosthetic leg/ knee mechanism.      Ascend/ descend stairs x3 with step to pattern and SBA/CGA for safety.  Fatigue noted but motivated to increase endurance.    Amb. From Nustep to car after tx. session.        PATIENT EDUCATION:  Education details: HEP/ gait and prosthetic training.  Person educated: Patient and Spouse Education method: Explanation, Demonstration, and Verbal cues Education comprehension: verbalized understanding, returned demonstration, and tactile cues required   HOME EXERCISE PROGRAM: Prone position hip stretches/ Standing marching at kitchen counter with w/c behind patient.      ASSESSMENT:  CLINICAL IMPRESSION: Pt. ambulates around PT clinic with use of prosthetic leg/ RW with consistent recip.  Good step to gait at clinic stairs today with no LOB but extra time/ focus to complete task.  Pt. Will benefit from skilled PT services to improve standing  tolerance/ gait independence and ADL.    OBJECTIVE IMPAIRMENTS Abnormal gait, decreased activity tolerance, decreased balance, decreased endurance, decreased mobility, difficulty walking, decreased ROM, decreased strength, decreased safety awareness, impaired flexibility, improper body mechanics, prosthetic dependency , and pain.   ACTIVITY LIMITATIONS carrying, lifting, standing, squatting, stairs, transfers, bed mobility, bathing, toileting, dressing, hygiene/grooming, and locomotion level  PARTICIPATION LIMITATIONS: cleaning, laundry, driving, shopping, community activity, and yard work  [place] Past/current experiences are also affecting patient's functional outcome.   REHAB POTENTIAL: Good  CLINICAL DECISION MAKING: Evolving/moderate complexity  EVALUATION COMPLEXITY: High   GOALS: Goals reviewed with patient? Yes  SHORT TERM GOALS: Target date: 10/07/21 Pt. Able to don/doff R prosthetic leg  independently to improve ADL/standing tolerance.  Baseline:  min. A to don/ doff prosthetic leg Goal status: Goal met   LONG TERM GOALS: Target date: 01/27/22  Pt. Will increase FOTO to 54 to improve functional mobility.  Baseline: initial FOTO 46.  11/2: 50 Goal status: Partially met  2.  Pt. Able to tolerate standing 10 minutes while pulling up pants with mod. I safely while wearing prosthetic leg to improve daily activities.  Baseline:  limited standing tolerance Goal status: Not met  3.  Pt. Will ambulate 200 feet with use of SPC and mod. I to promote safety with household tasks/ walking into bathroom.   Baseline:  amb. With RW and min. A for safety.  11/2:   Goal status: Not met   PLAN: PT FREQUENCY: 2x/week  PT DURATION: 8 weeks  PLANNED INTERVENTIONS: Therapeutic exercises, Therapeutic activity, Neuromuscular re-education, Balance training, Gait training, Patient/Family education, Self Care, Joint mobilization, Prosthetic training, DME instructions, and Manual therapy  PLAN FOR NEXT SESSION:  Progress gait/ step ups.    Pura Spice, PT, DPT # [phone] 12/18/2021, 11:51 AM

## 2021-12-18 NOTE — Therapy (Addendum)
OUTPATIENT PHYSICAL THERAPY LOWER EXTREMITY TREATMENT   Patient Name: Jasmine Morrow MRN: [id number] DOB:[birth year], 76 y.o., female Today's Date: 12/10/2021   PT End of Session - 12/18/21 1337     Visit Number 22    Number of Visits 9    Date for PT Re-Evaluation 01/27/22    PT Start Time 1829    PT Stop Time 9371    PT Time Calculation (min) 65 min    Activity Tolerance Patient tolerated treatment well             Past Medical History:  Diagnosis Date   Anemia    Anxiety    Asthma    seasonal    GERD (gastroesophageal reflux disease)    Hypertension    PONV (postoperative nausea and vomiting)    Seasonal allergies    Sleep apnea    does not uses CPAP machine anymore    Past Surgical History:  Procedure Laterality Date   APPENDECTOMY     CESAREAN SECTION     CHOLECYSTECTOMY     DILATION AND CURETTAGE OF UTERUS     ESOPHAGOGASTRODUODENOSCOPY N/A 02/22/2019   Procedure: ESOPHAGOGASTRODUODENOSCOPY (EGD);  Surgeon: Toledo, Benay Pike, MD;  Location: [facility];  Service: Gastroenterology;  Laterality: N/A;   FLEXIBLE SIGMOIDOSCOPY N/A 02/22/2019   Procedure: FLEXIBLE SIGMOIDOSCOPY;  Surgeon: Toledo, Benay Pike, MD;  Location: [facility];  Service: Gastroenterology;  Laterality: N/A;   HARDWARE REMOVAL Right 11/12/2019   Procedure: Right ankle hardware removal;  Surgeon: Hessie Knows, MD;  Location: [facility];  Service: Orthopedics;  Laterality: Right;   HARDWARE REMOVAL Right 04/17/2020   Procedure: HARDWARE REMOVAL FROM RIGHT ANKLE AND RIGHT KNEE; IMPLANT OF CEMENT SPACERS IN RIGHT KNEE;  Surgeon: Dereck Leep, MD;  Location: [facility];  Service: Orthopedics;  Laterality: Right;   JOINT REPLACEMENT     ORIF ANKLE FRACTURE Right 03/14/2019   Procedure: OPEN REDUCTION INTERNAL FIXATION (ORIF) ANKLE FRACTURE, MEDIAL MALLEOLUS;  Surgeon: Hessie Knows, MD;  Location: [facility];  Service: Orthopedics;  Laterality: Right;   ORIF ANKLE FRACTURE Right 02/08/2019    Procedure: OPEN REDUCTION INTERNAL FIXATION (ORIF) ANKLE FRACTURE, post malleolus;  Surgeon: Hessie Knows, MD;  Location: [facility];  Service: Orthopedics;  Laterality: Right;   REPLACEMENT TOTAL KNEE Bilateral 2008   2009   SCAR DEBRIDEMENT OF TOTAL KNEE  01/2020   SYNDESMOSIS REPAIR Right 02/08/2019   Procedure: SYNDESMOSIS REPAIR;  Surgeon: Hessie Knows, MD;  Location: [facility];  Service: Orthopedics;  Laterality: Right;   TEE WITHOUT CARDIOVERSION N/A 02/26/2019   Procedure: TRANSESOPHAGEAL ECHOCARDIOGRAM (TEE);  Surgeon: Corey Skains, MD;  Location: [facility];  Service: Cardiovascular;  Laterality: N/A;   TOTAL KNEE REVISION WITH SCAR DEBRIDEMENT/PATELLA REVISION WITH POLY EXCHANGE Right 01/24/2020   Procedure: TOTAL KNEE REVISION WITH SCAR DEBRIDEMENT/PATELLA REVISION WITH POLY EXCHANGE;  Surgeon: Dereck Leep, MD;  Location: [facility];  Service: Orthopedics;  Laterality: Right;   TUMOR REMOVAL     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   Patient Active Problem List   Diagnosis Date Noted   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[place]) 04/17/2020   Anxiety 04/17/2020   History of GI diverticular bleed 04/17/2020   Chest pain 04/17/2020   PAD (peripheral artery disease) ([place]) 04/05/2020   Ulcer of right leg ([place]) 04/05/2020   COPD (chronic obstructive pulmonary disease) ([place]) 04/05/2020   GERD (gastroesophageal reflux disease) 04/05/2020   Abscess of right knee 01/22/2020   Pressure injury of skin 02/28/2019  SOB (shortness of breath)    Gastrointestinal hemorrhage    Sepsis (HCC)    Symptomatic anemia 02/20/2019   Trimalleolar fracture of ankle, closed, right, initial encounter 02/07/2019   Multiple lung nodules 03/31/2014   Extrinsic asthma 03/27/2014   OSA on CPAP 03/27/2014    PCP: Miller, Mark F, MD  REFERRING PROVIDER: Miller, Mark F, MD  REFERRING DIAG: Above knee amputation of right lower extremity  THERAPY DIAG:  S/P AKA (above knee amputation) unilateral, right  (HCC)  Muscle weakness (generalized)  Gait difficulty  Abnormal posture  Rationale for Evaluation and Treatment Rehabilitation  ONSET DATE: 12/02/20  SUBJECTIVE:  EVALUATION  PERTINENT HISTORY: Patient Profile:   Jasmine Morrow is a 76 y.o. female Chief Complaint  Patient presents with  Visit Follow Up  Doing well.   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 Cardiac cath 10/2011 showed completely normal coronary arteries, however she did have mild left ventricular dysfunction with anterior wall motion abnormality. Left ventricular EF was 51%.  History of chickenpox  Hyperlipidemia  patient states she has never been diagnosed with this  Hypertension  Knee joint replacement by other means  Macular degeneration  Major depressive disorder, recurrent, mild (CMS-HCC)  Medicare annual wellness visit, initial 08/09/2019  7/21  Multiple thyroid nodules  Normal coronary arteries 09/06/2013  Normal coronary anatomy by cardiac catheterization 10/12/11  Osteoarthritis  Ovarian cyst  PONV (postoperative nausea and vomiting)  nausea  Postoperative urinary retention 12/08/2020  Rash on lips 12/21/2020  Seasonal allergies  Sinusitis, unspecified  Sleep apnea  On CPAP.  Slow transit constipation 12/10/2020  Trimalleolar fracture of ankle, closed, right, initial encounter 02/07/2019  Complicated by  staph infection 1/21, 4 weeks IV Ancef, Menz  Ulcer   Past Surgical History:  Procedure Laterality Date  SALPINGO OOPHORECTOMY Bilateral 1993  Right total knee arthroplasty 10/11/2006  Dr Hooten  Left total knee arthroplasty 10/12/2007  Dr Hooten  COLONOSCOPY 07/30/2012  internal hemorrhoids, diverticulosis  ANKLE ARTHODESIS W/ ARTHROSCOPY Right 02/08/2019  03/14/2019 second surgery  COLONOSCOPY 02/22/2019  Blood in entire colon/Diverticulosis - Presumed diverticular bleed. No repeat recommended per TKT.  EGD 02/22/2019  Gastritis/gastric ulcers/Hiatal hernia/Otherwise normal - no repeat recommended per TKT.  ORIF ANKLE FRACTURE Right 03/14/2019  Dr. Menz  REMOVAL HARDWARE ANKLE FOOT/TOES Right 11/12/2019  Dr. Menz  Right knee arthrotomy, irrigation and debridement of the right knee, polyethylene exchange 01/24/2020  Dr Hooten  Removal of hardware (plates and screws) from the right ankle with irrigation, debridement, and placement of Stimulan antibiotic beads 04/17/2020  Dr Hooten  Right knee arthrotomy, extensive irrigation debridement, removal of right total knee implants, and placement of antibiotic impregnated polymethylmethacrylate cement spacer 04/17/2020  Dr Hooten  AMPUTATION LEG ABOVE KNEE AKA Right 12/02/2020  Procedure: AMPUTATION, THIGH, THROUGH FEMUR, ANY LEVEL; Surgeon: Chun, Danielle Sunae, MD; Location: [facility]; Service: Orthopedics; Laterality: Right;  TRANSFER ADJACENT TISSUE LEG Right 12/02/2020  Procedure: ADJACENT TISSUE TRANSFER OR REARRANGEMENT, LEG;   DEFECT 10 SQ CM OR LESS; Surgeon: Chun, Danielle Sunae, MD; Location: [facility]; Service: Orthopedics; Laterality: Right;  ASPIRATION/INJECTION MAJOR JOINT/BURSA KNEE Left 12/02/2020  Procedure: ARTHROCENTESIS, ASPIRATION AND/OR INJECTION, MAJOR JOINT OR BURSA, KNEE; WITHOUT ULTRASOUND GUIDANCE; Surgeon: Chun, Danielle Sunae, MD; Location: [facility]; Service: Orthopedics; Laterality: Left;  REMOVAL  KNEE PROSTHESIS W/POSSIBLE PLACEMENT SPACER Left 01/18/2021  Procedure: REMOVAL OF PROSTHESIS, INCLUDING TOTAL KNEE PROSTHESIS, METHYLMETHACRYLATE WITH OR WITHOUT INSERTION OF SPACER, KNEE; Surgeon: Jiranek, William Arthur, MD; Location: [facility]; Service: Orthopedics; Laterality: Left;  INSERTION NON-BIODEGRADABLE DRUG DELIVERY IMPLANT Left 01/18/2021  Procedure: INSERTION, KNEE, bioresorbable, biodegradable, NON-BIODEGRADABLE DRUG DELIVERY IMPLANT; Surgeon: Jiranek, William Arthur, MD; Location: [facility]; Service: Orthopedics; Laterality: Left;  ABOVE KNEE LEG AMPUTATION  12/02/2020  APPENDECTOMY Est 1968  CESAREAN SECTION  CHOLECYSTECTOMY  FRACTURE SURGERY 1/21  HYSTERECTOMY partial  JOINT REPLACEMENT 2008 & 2009  TUBAL LIGATION 3/79   ALLERGIES: Allergies  Allergen Reactions  Singulair [Montelukast] Other (See Comments)  Gland swelling  Sulfa (Sulfonamide Antibiotics) Swelling  Oxycodone Dizziness and Other (See Comments)  Vancomycin Analogues Other (See Comments)  Ringing in ears  Avinza [Morphine] Itching  Darvocet A500 [Propoxyphene N-Acetaminophen] Nausea  Nickel Rash  Ultracet [Tramadol-Acetaminophen] Rash  Vicodin [Hydrocodone-Acetaminophen] Vomiting  Vioxx [Rofecoxib] Other (See Comments)  GI   CURRENT MEDICATIONS: Current Outpatient Medications: acetaminophen (TYLENOL) 500 MG tablet, Take 1,000 mg by mouth 3 (three) times a day Twice daily per patient., PRN Not Currently Taking aspirin 81 MG EC tablet, Take 1 tablet (81 mg total) by mouth at bedtime, Taking calcium carbonate-vitamin D3 (CALTRATE 600+D) 600 mg-10 mcg (400 unit) tablet, Take 0.5 tablets by mouth 2 (two) times daily, Taking cetirizine (ZYRTEC) 10 mg capsule, Take 1 capsule (10 mg total) by mouth once daily, Taking escitalopram oxalate (LEXAPRO) 10 MG tablet, Take 1 tablet (10 mg total) by mouth once daily, Taking estradioL (ESTRACE) 0.01 % (0.1 mg/gram) vaginal cream, Place 2 g vaginally twice a  week, Taking folic acid/multivit,iron,miner (MULTIVIT-IRON-MIN-FOLIC ACID ORAL), Take 1 tablet by mouth once daily, Taking gabapentin (NEURONTIN) 300 MG capsule, Take 1 capsule (300 mg total) by mouth 3 (three) times daily Phantom limb pain, Taking melatonin 3 mg tablet, Take 1 tablet (3 mg total) by mouth at bedtime, Taking multivitamin with iron (COMPLETE MULTIVITAMIN-MINERAL) tablet, Take 1 tablet by mouth, Taking multivitamin with minerals, EYE, (PRESERVISION AREDS 2) soft gel capsule, Take 1 capsule by mouth 2 (two) times daily, Taking nystatin (MYCOSTATIN) 100,000 unit/gram powder, Apply small amount topically twice daily for rash under breasts, PRN Not Currently Taking pantoprazole (PROTONIX) 40 MG DR tablet, Take 1 tablet (40 mg total) by mouth 2 (two) times daily. Please call to schedule an appt. Thanks!, Taking polyethylene glycol (MIRALAX) powder, Take 17 g by mouth once daily Mix in 4-8ounces of fluid prior to taking., Taking sennosides-docusate (SENOKOT-S) 8.6-50 mg tablet, Take 1 tablet by mouth 2 (two) times daily For constipation, Taking lactose-reduced food (ENSURE PLUS ORAL), Take 237 mLs by mouth 2 (two) times daily With breakfast and dinner  HPI   CLINICAL SUMMARY:  Patient post right AKA and working with prosthesis. 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 Able to do housework, improving  PAIN:  Are you having pain? Yes: NPRS scale: 5/10 Pain location: L knee Pain description: aching Aggravating factors: walking Relieving factors: rest  PRECAUTIONS: None  WEIGHT BEARING RESTRICTIONS No  FALLS:  Has patient fallen in last 6 months? No  LIVING ENVIRONMENT:   Lives with: lives with their spouse Lives in: House/apartment Stairs: No Has following equipment at home: Gilford Rile - 4 wheeled and Wheelchair (manual)  OCCUPATION: retired  PLOF: Requires assistive device for independence  PATIENT GOALS  improve standing tolerance/ pull up  pants/ ambulate with [place] safely.     OBJECTIVE:   PATIENT SURVEYS:  FOTO initial 46/ goal 1.   11/2: 50  COGNITION:  Overall cognitive status: Within functional limits for tasks assessed     SENSATION: WFL  POSTURE: rounded shoulders, forward head, flexed trunk , and weight shift left  PALPATION: No tenderness along R distal residual limb.  Discussed phantom limb sensation.    LOWER EXTREMITY ROM:  B LE AROM WFL except L knee extension secondary to articulating spacer.  Did not assess L/R hip extension at this time.    LOWER EXTREMITY MMT:  MMT Right eval Left eval  Hip flexion 4/5 4/5  Hip extension    Hip abduction 4+/5 4+/5  Hip adduction 4+/5 4+/5  Hip internal rotation    Hip external rotation    Knee flexion N/A 4+/5  Knee extension N/A 4+/5  Ankle dorsiflexion  5/5  Ankle plantarflexion    Ankle inversion    Ankle eversion     (Blank rows = not tested)  GAIT: Distance walked: in gym/ //-bars Assistive device utilized: Environmental consultant - 2 wheeled Level of assistance: CGA Comments: Cuing for posture correction with mirror feedback.  Flexed posture/ heavy UE assist.     TODAY'S TREATMENT:  12/10/21  Subjective:  Pt. Arrived to PT in w/c and carrying prosthetic leg.  Pt. Reports no new complaints.  No ambulation at home due to no CGA for safety.  Pt. Hoping to be able to ascend/ descend stairs with upcoming holiday party.    Objective:    There.ex.:    Pt. Transferred from w/c to blue mat to don prosthetic leg.      Seated marching on blue mat table 10x.  5xSTS with decrease UE assist and focus on wt. Shifting/ proper technique into RW.      Forward/ backwards/ lateral walking in //-bars with cuing to increase step length (2 laps).  B UE assist and cuing to correct flexed posture.       Nustep L4 10 min. With B UE/LE.  Use of R toe clip to improve placement of R foot.  Gait training:    Ambulate in //-bars with use of Lofstrand crutch on L/ //-bar on R.   CGA to mod. A for safety and cuing to proper technique.  Good sequencing with focus on proper BOS.  Pt. Tends to increase lumbar/forward flexion (mirror feedback).     Ambulate in clinic (110 feet/ 100 feet) with RW and CGA with cuing to correct head position/ upright posture.  No LOB and consistent R hip flexion/ step length.  Good control of R prosthetic leg/ knee mechanism.      No stairs today secondary focus on Lofstrand crutch in //-bars (fatigue)   Amb. From Nustep to car after tx. session.        PATIENT EDUCATION:  Education details: HEP/ gait and prosthetic training.  Person educated: Patient and Spouse Education method: Explanation, Demonstration, and Verbal cues Education comprehension: verbalized understanding, returned demonstration, and tactile cues required   HOME EXERCISE PROGRAM: Prone position hip stretches/ Standing marching at kitchen counter with w/c behind patient.      ASSESSMENT:  CLINICAL IMPRESSION: Pt. ambulates around PT clinic with use  of prosthetic leg/ RW with consistent recip.  Pt. Challenged with L UE Lofstrand crutch/ R UE //-bars with 4-point gait pattern and CGA/mod. A for safety.  No LOB but extra time/ focus to complete task.  Pt. Will benefit from skilled PT services to improve standing tolerance/ gait independence and ADL.    OBJECTIVE IMPAIRMENTS Abnormal gait, decreased activity tolerance, decreased balance, decreased endurance, decreased mobility, difficulty walking, decreased ROM, decreased strength, decreased safety awareness, impaired flexibility, improper body mechanics, prosthetic dependency , and pain.   ACTIVITY LIMITATIONS carrying, lifting, standing, squatting, stairs, transfers, bed mobility, bathing, toileting, dressing, hygiene/grooming, and locomotion level  PARTICIPATION LIMITATIONS: cleaning, laundry, driving, shopping, community activity, and yard work  [place] Past/current experiences are also affecting patient's  functional outcome.   REHAB POTENTIAL: Good  CLINICAL DECISION MAKING: Evolving/moderate complexity  EVALUATION COMPLEXITY: High   GOALS: Goals reviewed with patient? Yes  SHORT TERM GOALS: Target date: 10/07/21 Pt. Able to don/doff R prosthetic leg independently to improve ADL/standing tolerance.  Baseline:  min. A to don/ doff prosthetic leg Goal status: Goal met   LONG TERM GOALS: Target date: 01/27/22  Pt. Will increase FOTO to 54 to improve functional mobility.  Baseline: initial FOTO 46.  11/2: 50 Goal status: Partially met  2.  Pt. Able to tolerate standing 10 minutes while pulling up pants with mod. I safely while wearing prosthetic leg to improve daily activities.  Baseline:  limited standing tolerance Goal status: Not met  3.  Pt. Will ambulate 200 feet with use of SPC and mod. I to promote safety with household tasks/ walking into bathroom.   Baseline:  amb. With RW and min. A for safety.  11/2:   Goal status: Not met   PLAN: PT FREQUENCY: 2x/week  PT DURATION: 8 weeks  PLANNED INTERVENTIONS: Therapeutic exercises, Therapeutic activity, Neuromuscular re-education, Balance training, Gait training, Patient/Family education, Self Care, Joint mobilization, Prosthetic training, DME instructions, and Manual therapy  PLAN FOR NEXT SESSION:  Progress gait/ step ups.  Check use of forearm crutch in //-bars next tx.   Pura Spice, PT, DPT # [phone] 12/11/2021, 11:58 AM

## 2021-12-21 ENCOUNTER — Ambulatory Visit: Payer: Medicare Other | Admitting: Physical Therapy

## 2021-12-21 DIAGNOSIS — R293 Abnormal posture: Secondary | ICD-10-CM

## 2021-12-21 DIAGNOSIS — Z89611 Acquired absence of right leg above knee: Secondary | ICD-10-CM

## 2021-12-21 DIAGNOSIS — M6281 Muscle weakness (generalized): Secondary | ICD-10-CM

## 2021-12-21 DIAGNOSIS — R269 Unspecified abnormalities of gait and mobility: Secondary | ICD-10-CM

## 2021-12-27 ENCOUNTER — Ambulatory Visit: Payer: Medicare Other | Admitting: Physical Therapy

## 2021-12-27 DIAGNOSIS — M6281 Muscle weakness (generalized): Secondary | ICD-10-CM

## 2021-12-27 DIAGNOSIS — Z89611 Acquired absence of right leg above knee: Secondary | ICD-10-CM

## 2021-12-27 DIAGNOSIS — R293 Abnormal posture: Secondary | ICD-10-CM

## 2021-12-27 DIAGNOSIS — R269 Unspecified abnormalities of gait and mobility: Secondary | ICD-10-CM

## 2021-12-27 NOTE — Therapy (Unsigned)
OUTPATIENT PHYSICAL THERAPY LOWER EXTREMITY TREATMENT   Patient Name: Jasmine Morrow MRN: [id number] DOB:December 20, 1945, 76 y.o., female Today's Date: 12/27/2021   PT End of Session - 12/27/21 1315     Visit Number 24    Number of Visits 65    Date for PT Re-Evaluation 01/27/22    PT Start Time 1031    PT Stop Time 1136    PT Time Calculation (min) 65 min    Activity Tolerance Patient tolerated treatment well             Past Medical History:  Diagnosis Date   Anemia    Anxiety    Asthma    seasonal    GERD (gastroesophageal reflux disease)    Hypertension    PONV (postoperative nausea and vomiting)    Seasonal allergies    Sleep apnea    does not uses CPAP machine anymore    Past Surgical History:  Procedure Laterality Date   APPENDECTOMY     CESAREAN SECTION     CHOLECYSTECTOMY     DILATION AND CURETTAGE OF UTERUS     ESOPHAGOGASTRODUODENOSCOPY N/A 02/22/2019   Procedure: ESOPHAGOGASTRODUODENOSCOPY (EGD);  Surgeon: Toledo, Benay Pike, MD;  Location: [facility];  Service: Gastroenterology;  Laterality: N/A;   FLEXIBLE SIGMOIDOSCOPY N/A 02/22/2019   Procedure: FLEXIBLE SIGMOIDOSCOPY;  Surgeon: Toledo, Benay Pike, MD;  Location: [facility];  Service: Gastroenterology;  Laterality: N/A;   HARDWARE REMOVAL Right 11/12/2019   Procedure: Right ankle hardware removal;  Surgeon: Hessie Knows, MD;  Location: [facility];  Service: Orthopedics;  Laterality: Right;   HARDWARE REMOVAL Right 04/17/2020   Procedure: HARDWARE REMOVAL FROM RIGHT ANKLE AND RIGHT KNEE; IMPLANT OF CEMENT SPACERS IN RIGHT KNEE;  Surgeon: Dereck Leep, MD;  Location: [facility];  Service: Orthopedics;  Laterality: Right;   JOINT REPLACEMENT     ORIF ANKLE FRACTURE Right 03/14/2019   Procedure: OPEN REDUCTION INTERNAL FIXATION (ORIF) ANKLE FRACTURE, MEDIAL MALLEOLUS;  Surgeon: Hessie Knows, MD;  Location: [facility];  Service: Orthopedics;  Laterality: Right;   ORIF ANKLE FRACTURE Right 02/08/2019    Procedure: OPEN REDUCTION INTERNAL FIXATION (ORIF) ANKLE FRACTURE, post malleolus;  Surgeon: Hessie Knows, MD;  Location: [facility];  Service: Orthopedics;  Laterality: Right;   REPLACEMENT TOTAL KNEE Bilateral 2008   2009   SCAR DEBRIDEMENT OF TOTAL KNEE  01/2020   SYNDESMOSIS REPAIR Right 02/08/2019   Procedure: SYNDESMOSIS REPAIR;  Surgeon: Hessie Knows, MD;  Location: [facility];  Service: Orthopedics;  Laterality: Right;   TEE WITHOUT CARDIOVERSION N/A 02/26/2019   Procedure: TRANSESOPHAGEAL ECHOCARDIOGRAM (TEE);  Surgeon: Corey Skains, MD;  Location: [facility];  Service: Cardiovascular;  Laterality: N/A;   TOTAL KNEE REVISION WITH SCAR DEBRIDEMENT/PATELLA REVISION WITH POLY EXCHANGE Right 01/24/2020   Procedure: TOTAL KNEE REVISION WITH SCAR DEBRIDEMENT/PATELLA REVISION WITH POLY EXCHANGE;  Surgeon: Dereck Leep, MD;  Location: [facility];  Service: Orthopedics;  Laterality: Right;   TUMOR REMOVAL     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   Patient Active Problem List   Diagnosis Date Noted   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[place]) 04/17/2020   Anxiety 04/17/2020   History of GI diverticular bleed 04/17/2020   Chest pain 04/17/2020   PAD (peripheral artery disease) ([place]) 04/05/2020   Ulcer of right leg ([place]) 04/05/2020   COPD (chronic obstructive pulmonary disease) ([place]) 04/05/2020   GERD (gastroesophageal reflux disease) 04/05/2020   Abscess of right knee 01/22/2020   Pressure injury of skin 02/28/2019  SOB (shortness of breath)    Gastrointestinal hemorrhage    Sepsis (HCC)    Symptomatic anemia 02/20/2019   Trimalleolar fracture of ankle, closed, right, initial encounter 02/07/2019   Multiple lung nodules 03/31/2014   Extrinsic asthma 03/27/2014   OSA on CPAP 03/27/2014    PCP: Miller, Mark F, MD  REFERRING PROVIDER: Miller, Mark F, MD  REFERRING DIAG: Above knee amputation of right lower extremity  THERAPY DIAG:  S/P AKA (above knee amputation) unilateral, right  (HCC)  Muscle weakness (generalized)  Gait difficulty  Abnormal posture  Rationale for Evaluation and Treatment Rehabilitation  ONSET DATE: 12/02/20  SUBJECTIVE:  EVALUATION  PERTINENT HISTORY: Patient Profile:   Jasmine Morrow is a 76 y.o. female Chief Complaint  Patient presents with  Visit Follow Up  Doing well.   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 Cardiac cath 10/2011 showed completely normal coronary arteries, however she did have mild left ventricular dysfunction with anterior wall motion abnormality. Left ventricular EF was 51%.  History of chickenpox  Hyperlipidemia  patient states she has never been diagnosed with this  Hypertension  Knee joint replacement by other means  Macular degeneration  Major depressive disorder, recurrent, mild (CMS-HCC)  Medicare annual wellness visit, initial 08/09/2019  7/21  Multiple thyroid nodules  Normal coronary arteries 09/06/2013  Normal coronary anatomy by cardiac catheterization 10/12/11  Osteoarthritis  Ovarian cyst  PONV (postoperative nausea and vomiting)  nausea  Postoperative urinary retention 12/08/2020  Rash on lips 12/21/2020  Seasonal allergies  Sinusitis, unspecified  Sleep apnea  On CPAP.  Slow transit constipation 12/10/2020  Trimalleolar fracture of ankle, closed, right, initial encounter 02/07/2019  Complicated by  staph infection 1/21, 4 weeks IV Ancef, Menz  Ulcer   Past Surgical History:  Procedure Laterality Date  SALPINGO OOPHORECTOMY Bilateral 1993  Right total knee arthroplasty 10/11/2006  Dr Hooten  Left total knee arthroplasty 10/12/2007  Dr Hooten  COLONOSCOPY 07/30/2012  internal hemorrhoids, diverticulosis  ANKLE ARTHODESIS W/ ARTHROSCOPY Right 02/08/2019  03/14/2019 second surgery  COLONOSCOPY 02/22/2019  Blood in entire colon/Diverticulosis - Presumed diverticular bleed. No repeat recommended per TKT.  EGD 02/22/2019  Gastritis/gastric ulcers/Hiatal hernia/Otherwise normal - no repeat recommended per TKT.  ORIF ANKLE FRACTURE Right 03/14/2019  Dr. Menz  REMOVAL HARDWARE ANKLE FOOT/TOES Right 11/12/2019  Dr. Menz  Right knee arthrotomy, irrigation and debridement of the right knee, polyethylene exchange 01/24/2020  Dr Hooten  Removal of hardware (plates and screws) from the right ankle with irrigation, debridement, and placement of Stimulan antibiotic beads 04/17/2020  Dr Hooten  Right knee arthrotomy, extensive irrigation debridement, removal of right total knee implants, and placement of antibiotic impregnated polymethylmethacrylate cement spacer 04/17/2020  Dr Hooten  AMPUTATION LEG ABOVE KNEE AKA Right 12/02/2020  Procedure: AMPUTATION, THIGH, THROUGH FEMUR, ANY LEVEL; Surgeon: Chun, Danielle Sunae, MD; Location: [facility]; Service: Orthopedics; Laterality: Right;  TRANSFER ADJACENT TISSUE LEG Right 12/02/2020  Procedure: ADJACENT TISSUE TRANSFER OR REARRANGEMENT, LEG;   DEFECT 10 SQ CM OR LESS; Surgeon: Chun, Danielle Sunae, MD; Location: [facility]; Service: Orthopedics; Laterality: Right;  ASPIRATION/INJECTION MAJOR JOINT/BURSA KNEE Left 12/02/2020  Procedure: ARTHROCENTESIS, ASPIRATION AND/OR INJECTION, MAJOR JOINT OR BURSA, KNEE; WITHOUT ULTRASOUND GUIDANCE; Surgeon: Chun, Danielle Sunae, MD; Location: [facility]; Service: Orthopedics; Laterality: Left;  REMOVAL  KNEE PROSTHESIS W/POSSIBLE PLACEMENT SPACER Left 01/18/2021  Procedure: REMOVAL OF PROSTHESIS, INCLUDING TOTAL KNEE PROSTHESIS, METHYLMETHACRYLATE WITH OR WITHOUT INSERTION OF SPACER, KNEE; Surgeon: Jiranek, William Arthur, MD; Location: [facility]; Service: Orthopedics; Laterality: Left;  INSERTION NON-BIODEGRADABLE DRUG DELIVERY IMPLANT Left 01/18/2021  Procedure: INSERTION, KNEE, bioresorbable, biodegradable, NON-BIODEGRADABLE DRUG DELIVERY IMPLANT; Surgeon: Jiranek, William Arthur, MD; Location: [facility]; Service: Orthopedics; Laterality: Left;  ABOVE KNEE LEG AMPUTATION  12/02/2020  APPENDECTOMY Est 1968  CESAREAN SECTION  CHOLECYSTECTOMY  FRACTURE SURGERY 1/21  HYSTERECTOMY partial  JOINT REPLACEMENT 2008 & 2009  TUBAL LIGATION 3/79   ALLERGIES: Allergies  Allergen Reactions  Singulair [Montelukast] Other (See Comments)  Gland swelling  Sulfa (Sulfonamide Antibiotics) Swelling  Oxycodone Dizziness and Other (See Comments)  Vancomycin Analogues Other (See Comments)  Ringing in ears  Avinza [Morphine] Itching  Darvocet A500 [Propoxyphene N-Acetaminophen] Nausea  Nickel Rash  Ultracet [Tramadol-Acetaminophen] Rash  Vicodin [Hydrocodone-Acetaminophen] Vomiting  Vioxx [Rofecoxib] Other (See Comments)  GI   CURRENT MEDICATIONS: Current Outpatient Medications: acetaminophen (TYLENOL) 500 MG tablet, Take 1,000 mg by mouth 3 (three) times a day Twice daily per patient., PRN Not Currently Taking aspirin 81 MG EC tablet, Take 1 tablet (81 mg total) by mouth at bedtime, Taking calcium carbonate-vitamin D3 (CALTRATE 600+D) 600 mg-10 mcg (400 unit) tablet, Take 0.5 tablets by mouth 2 (two) times daily, Taking cetirizine (ZYRTEC) 10 mg capsule, Take 1 capsule (10 mg total) by mouth once daily, Taking escitalopram oxalate (LEXAPRO) 10 MG tablet, Take 1 tablet (10 mg total) by mouth once daily, Taking estradioL (ESTRACE) 0.01 % (0.1 mg/gram) vaginal cream, Place 2 g vaginally twice a  week, Taking folic acid/multivit,iron,miner (MULTIVIT-IRON-MIN-FOLIC ACID ORAL), Take 1 tablet by mouth once daily, Taking gabapentin (NEURONTIN) 300 MG capsule, Take 1 capsule (300 mg total) by mouth 3 (three) times daily Phantom limb pain, Taking melatonin 3 mg tablet, Take 1 tablet (3 mg total) by mouth at bedtime, Taking multivitamin with iron (COMPLETE MULTIVITAMIN-MINERAL) tablet, Take 1 tablet by mouth, Taking multivitamin with minerals, EYE, (PRESERVISION AREDS 2) soft gel capsule, Take 1 capsule by mouth 2 (two) times daily, Taking nystatin (MYCOSTATIN) 100,000 unit/gram powder, Apply small amount topically twice daily for rash under breasts, PRN Not Currently Taking pantoprazole (PROTONIX) 40 MG DR tablet, Take 1 tablet (40 mg total) by mouth 2 (two) times daily. Please call to schedule an appt. Thanks!, Taking polyethylene glycol (MIRALAX) powder, Take 17 g by mouth once daily Mix in 4-8ounces of fluid prior to taking., Taking sennosides-docusate (SENOKOT-S) 8.6-50 mg tablet, Take 1 tablet by mouth 2 (two) times daily For constipation, Taking lactose-reduced food (ENSURE PLUS ORAL), Take 237 mLs by mouth 2 (two) times daily With breakfast and dinner  HPI   CLINICAL SUMMARY:  Patient post right AKA and working with prosthesis. 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 Able to do housework, improving  PAIN:  Are you having pain? Yes: NPRS scale: 5/10 Pain location: L knee Pain description: aching Aggravating factors: walking Relieving factors: rest  PRECAUTIONS: None  WEIGHT BEARING RESTRICTIONS No  FALLS:  Has patient fallen in last 6 months? No  LIVING ENVIRONMENT:   Lives with: lives with their spouse Lives in: House/apartment Stairs: No Has following equipment at home: Gilford Rile - 4 wheeled and Wheelchair (manual)  OCCUPATION: retired  PLOF: Requires assistive device for independence  PATIENT GOALS  improve standing tolerance/ pull up  pants/ ambulate with [place] safely.     OBJECTIVE:   PATIENT SURVEYS:  FOTO initial 46/ goal 40.   11/2: 50  COGNITION:  Overall cognitive status: Within functional limits for tasks assessed     SENSATION: WFL  POSTURE: rounded shoulders, forward head, flexed trunk , and weight shift left  PALPATION: No tenderness along R distal residual limb.  Discussed phantom limb sensation.    LOWER EXTREMITY ROM:  B LE AROM WFL except L knee extension secondary to articulating spacer.  Did not assess L/R hip extension at this time.    LOWER EXTREMITY MMT:  MMT Right eval Left eval  Hip flexion 4/5 4/5  Hip extension    Hip abduction 4+/5 4+/5  Hip adduction 4+/5 4+/5  Hip internal rotation    Hip external rotation    Knee flexion N/A 4+/5  Knee extension N/A 4+/5  Ankle dorsiflexion  5/5  Ankle plantarflexion    Ankle inversion    Ankle eversion     (Blank rows = not tested)  GAIT: Distance walked: in gym/ //-bars Assistive device utilized: Environmental consultant - 2 wheeled Level of assistance: CGA Comments: Cuing for posture correction with mirror feedback.  Flexed posture/ heavy UE assist.     TODAY'S TREATMENT:  12/27/21  Subjective:  Pt. Arrived to PT in w/c and carrying prosthetic leg.  Pt. Reports no new complaints.  No ambulation at home due to no CGA for safety.    Objective:    There.ex.:    Pt. Transferred from w/c to blue mat to don prosthetic leg.        Nustep L4 10 min. With B UE/LE.  Use of R toe clip to improve placement of R foot.    Sit to stands from gray chair with B UE assist on arm rests 8x prior to stairs.     Gait training:    Ambulate in clinic/hallway (100 feet/ 85 feet) with RW and CGA with cuing to correct head position/ upright posture.  No LOB and consistent R hip flexion/ step length.  Good control of R prosthetic leg/ knee mechanism.      Ascend/ descend stairs x3 with step to pattern and SBA/CGA for safety.  Fatigue noted but motivated to increase  endurance.    Amb. From Nustep to car after tx. session.  Good hip flexion/ step length over threshold/ car in front of clinic.        PATIENT EDUCATION:  Education details: HEP/ gait and prosthetic training.  Person educated: Patient and Spouse Education method: Explanation, Demonstration, and Verbal cues Education comprehension: verbalized understanding, returned demonstration, and tactile cues required   HOME EXERCISE PROGRAM: Prone position hip stretches/ Standing marching at kitchen counter with w/c behind patient.      ASSESSMENT:  CLINICAL IMPRESSION: Pt. ambulates around PT clinic with use of prosthetic leg/ RW with consistent recip.  Good step to gait at clinic stairs today with no LOB but extra time/ focus to complete task.  Pt. Will benefit from skilled PT services to improve standing tolerance/ gait independence and ADL.    OBJECTIVE IMPAIRMENTS Abnormal gait, decreased activity tolerance, decreased balance, decreased endurance, decreased mobility, difficulty walking, decreased ROM, decreased strength, decreased safety awareness, impaired flexibility,  improper body mechanics, prosthetic dependency , and pain.   ACTIVITY LIMITATIONS carrying, lifting, standing, squatting, stairs, transfers, bed mobility, bathing, toileting, dressing, hygiene/grooming, and locomotion level  PARTICIPATION LIMITATIONS: cleaning, laundry, driving, shopping, community activity, and yard work  [place] Past/current experiences are also affecting patient's functional outcome.   REHAB POTENTIAL: Good  CLINICAL DECISION MAKING: Evolving/moderate complexity  EVALUATION COMPLEXITY: High   GOALS: Goals reviewed with patient? Yes  SHORT TERM GOALS: Target date: 10/07/21 Pt. Able to don/doff R prosthetic leg independently to improve ADL/standing tolerance.  Baseline:  min. A to don/ doff prosthetic leg Goal status: Goal met   LONG TERM GOALS: Target date: 01/27/22  Pt. Will increase  FOTO to 54 to improve functional mobility.  Baseline: initial FOTO 46.  11/2: 50 Goal status: Partially met  2.  Pt. Able to tolerate standing 10 minutes while pulling up pants with mod. I safely while wearing prosthetic leg to improve daily activities.  Baseline:  limited standing tolerance Goal status: Not met  3.  Pt. Will ambulate 200 feet with use of SPC and mod. I to promote safety with household tasks/ walking into bathroom.   Baseline:  amb. With RW and min. A for safety.  11/2:   Goal status: Not met   PLAN: PT FREQUENCY: 2x/week  PT DURATION: 8 weeks  PLANNED INTERVENTIONS: Therapeutic exercises, Therapeutic activity, Neuromuscular re-education, Balance training, Gait training, Patient/Family education, Self Care, Joint mobilization, Prosthetic training, DME instructions, and Manual therapy  PLAN FOR NEXT SESSION:  Progress gait/ step ups.    Pura Spice, PT, DPT # [phone] 12/28/2021, 11:59 AM

## 2021-12-28 ENCOUNTER — Encounter: Payer: Medicare Other | Admitting: Physical Therapy

## 2021-12-29 ENCOUNTER — Ambulatory Visit: Payer: Medicare Other | Admitting: Physical Therapy

## 2021-12-29 DIAGNOSIS — Z89611 Acquired absence of right leg above knee: Secondary | ICD-10-CM | POA: Diagnosis not present

## 2021-12-29 DIAGNOSIS — R293 Abnormal posture: Secondary | ICD-10-CM

## 2021-12-29 DIAGNOSIS — M6281 Muscle weakness (generalized): Secondary | ICD-10-CM

## 2021-12-29 DIAGNOSIS — R269 Unspecified abnormalities of gait and mobility: Secondary | ICD-10-CM

## 2021-12-29 NOTE — Therapy (Signed)
OUTPATIENT PHYSICAL THERAPY LOWER EXTREMITY TREATMENT   Patient Name: Jasmine Morrow MRN: [id number] DOB:19-Oct-1945, 76 y.o., female Today's Date: 12/21/2021   PT End of Session - 12/29/21 1930     Visit Number 25    Number of Visits 36    Date for PT Re-Evaluation 01/27/22    PT Start Time 1519    PT Stop Time 1630    PT Time Calculation (min) 71 min    Activity Tolerance Patient tolerated treatment well             Past Medical History:  Diagnosis Date   Anemia    Anxiety    Asthma    seasonal    GERD (gastroesophageal reflux disease)    Hypertension    PONV (postoperative nausea and vomiting)    Seasonal allergies    Sleep apnea    does not uses CPAP machine anymore    Past Surgical History:  Procedure Laterality Date   APPENDECTOMY     CESAREAN SECTION     CHOLECYSTECTOMY     DILATION AND CURETTAGE OF UTERUS     ESOPHAGOGASTRODUODENOSCOPY N/A 02/22/2019   Procedure: ESOPHAGOGASTRODUODENOSCOPY (EGD);  Surgeon: Toledo, Benay Pike, MD;  Location: [facility];  Service: Gastroenterology;  Laterality: N/A;   FLEXIBLE SIGMOIDOSCOPY N/A 02/22/2019   Procedure: FLEXIBLE SIGMOIDOSCOPY;  Surgeon: Toledo, Benay Pike, MD;  Location: [facility];  Service: Gastroenterology;  Laterality: N/A;   HARDWARE REMOVAL Right 11/12/2019   Procedure: Right ankle hardware removal;  Surgeon: Hessie Knows, MD;  Location: [facility];  Service: Orthopedics;  Laterality: Right;   HARDWARE REMOVAL Right 04/17/2020   Procedure: HARDWARE REMOVAL FROM RIGHT ANKLE AND RIGHT KNEE; IMPLANT OF CEMENT SPACERS IN RIGHT KNEE;  Surgeon: Dereck Leep, MD;  Location: [facility];  Service: Orthopedics;  Laterality: Right;   JOINT REPLACEMENT     ORIF ANKLE FRACTURE Right 03/14/2019   Procedure: OPEN REDUCTION INTERNAL FIXATION (ORIF) ANKLE FRACTURE, MEDIAL MALLEOLUS;  Surgeon: Hessie Knows, MD;  Location: [facility];  Service: Orthopedics;  Laterality: Right;   ORIF ANKLE FRACTURE Right 02/08/2019    Procedure: OPEN REDUCTION INTERNAL FIXATION (ORIF) ANKLE FRACTURE, post malleolus;  Surgeon: Hessie Knows, MD;  Location: [facility];  Service: Orthopedics;  Laterality: Right;   REPLACEMENT TOTAL KNEE Bilateral 2008   2009   SCAR DEBRIDEMENT OF TOTAL KNEE  01/2020   SYNDESMOSIS REPAIR Right 02/08/2019   Procedure: SYNDESMOSIS REPAIR;  Surgeon: Hessie Knows, MD;  Location: [facility];  Service: Orthopedics;  Laterality: Right;   TEE WITHOUT CARDIOVERSION N/A 02/26/2019   Procedure: TRANSESOPHAGEAL ECHOCARDIOGRAM (TEE);  Surgeon: Corey Skains, MD;  Location: [facility];  Service: Cardiovascular;  Laterality: N/A;   TOTAL KNEE REVISION WITH SCAR DEBRIDEMENT/PATELLA REVISION WITH POLY EXCHANGE Right 01/24/2020   Procedure: TOTAL KNEE REVISION WITH SCAR DEBRIDEMENT/PATELLA REVISION WITH POLY EXCHANGE;  Surgeon: Dereck Leep, MD;  Location: [facility];  Service: Orthopedics;  Laterality: Right;   TUMOR REMOVAL     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   Patient Active Problem List   Diagnosis Date Noted   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[place]) 04/17/2020   Anxiety 04/17/2020   History of GI diverticular bleed 04/17/2020   Chest pain 04/17/2020   PAD (peripheral artery disease) ([place]) 04/05/2020   Ulcer of right leg ([place]) 04/05/2020   COPD (chronic obstructive pulmonary disease) ([place]) 04/05/2020   GERD (gastroesophageal reflux disease) 04/05/2020   Abscess of right knee 01/22/2020   Pressure injury of skin 02/28/2019  SOB (shortness of breath)    Gastrointestinal hemorrhage    Sepsis (HCC)    Symptomatic anemia 02/20/2019   Trimalleolar fracture of ankle, closed, right, initial encounter 02/07/2019   Multiple lung nodules 03/31/2014   Extrinsic asthma 03/27/2014   OSA on CPAP 03/27/2014    PCP: Miller, Mark F, MD  REFERRING PROVIDER: Miller, Mark F, MD  REFERRING DIAG: Above knee amputation of right lower extremity  THERAPY DIAG:  S/P AKA (above knee amputation) unilateral, right  (HCC)  Muscle weakness (generalized)  Gait difficulty  Abnormal posture  Rationale for Evaluation and Treatment Rehabilitation  ONSET DATE: 12/02/20  SUBJECTIVE:  EVALUATION  PERTINENT HISTORY: Patient Profile:   Jasmine Morrow is a 76 y.o. female Chief Complaint  Patient presents with  Visit Follow Up  Doing well.   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 Cardiac cath 10/2011 showed completely normal coronary arteries, however she did have mild left ventricular dysfunction with anterior wall motion abnormality. Left ventricular EF was 51%.  History of chickenpox  Hyperlipidemia  patient states she has never been diagnosed with this  Hypertension  Knee joint replacement by other means  Macular degeneration  Major depressive disorder, recurrent, mild (CMS-HCC)  Medicare annual wellness visit, initial 08/09/2019  7/21  Multiple thyroid nodules  Normal coronary arteries 09/06/2013  Normal coronary anatomy by cardiac catheterization 10/12/11  Osteoarthritis  Ovarian cyst  PONV (postoperative nausea and vomiting)  nausea  Postoperative urinary retention 12/08/2020  Rash on lips 12/21/2020  Seasonal allergies  Sinusitis, unspecified  Sleep apnea  On CPAP.  Slow transit constipation 12/10/2020  Trimalleolar fracture of ankle, closed, right, initial encounter 02/07/2019  Complicated by  staph infection 1/21, 4 weeks IV Ancef, Menz  Ulcer   Past Surgical History:  Procedure Laterality Date  SALPINGO OOPHORECTOMY Bilateral 1993  Right total knee arthroplasty 10/11/2006  Dr Hooten  Left total knee arthroplasty 10/12/2007  Dr Hooten  COLONOSCOPY 07/30/2012  internal hemorrhoids, diverticulosis  ANKLE ARTHODESIS W/ ARTHROSCOPY Right 02/08/2019  03/14/2019 second surgery  COLONOSCOPY 02/22/2019  Blood in entire colon/Diverticulosis - Presumed diverticular bleed. No repeat recommended per TKT.  EGD 02/22/2019  Gastritis/gastric ulcers/Hiatal hernia/Otherwise normal - no repeat recommended per TKT.  ORIF ANKLE FRACTURE Right 03/14/2019  Dr. Menz  REMOVAL HARDWARE ANKLE FOOT/TOES Right 11/12/2019  Dr. Menz  Right knee arthrotomy, irrigation and debridement of the right knee, polyethylene exchange 01/24/2020  Dr Hooten  Removal of hardware (plates and screws) from the right ankle with irrigation, debridement, and placement of Stimulan antibiotic beads 04/17/2020  Dr Hooten  Right knee arthrotomy, extensive irrigation debridement, removal of right total knee implants, and placement of antibiotic impregnated polymethylmethacrylate cement spacer 04/17/2020  Dr Hooten  AMPUTATION LEG ABOVE KNEE AKA Right 12/02/2020  Procedure: AMPUTATION, THIGH, THROUGH FEMUR, ANY LEVEL; Surgeon: Chun, Danielle Sunae, MD; Location: [facility]; Service: Orthopedics; Laterality: Right;  TRANSFER ADJACENT TISSUE LEG Right 12/02/2020  Procedure: ADJACENT TISSUE TRANSFER OR REARRANGEMENT, LEG;   DEFECT 10 SQ CM OR LESS; Surgeon: Chun, Danielle Sunae, MD; Location: [facility]; Service: Orthopedics; Laterality: Right;  ASPIRATION/INJECTION MAJOR JOINT/BURSA KNEE Left 12/02/2020  Procedure: ARTHROCENTESIS, ASPIRATION AND/OR INJECTION, MAJOR JOINT OR BURSA, KNEE; WITHOUT ULTRASOUND GUIDANCE; Surgeon: Chun, Danielle Sunae, MD; Location: [facility]; Service: Orthopedics; Laterality: Left;  REMOVAL  KNEE PROSTHESIS W/POSSIBLE PLACEMENT SPACER Left 01/18/2021  Procedure: REMOVAL OF PROSTHESIS, INCLUDING TOTAL KNEE PROSTHESIS, METHYLMETHACRYLATE WITH OR WITHOUT INSERTION OF SPACER, KNEE; Surgeon: Jiranek, William Arthur, MD; Location: [facility]; Service: Orthopedics; Laterality: Left;  INSERTION NON-BIODEGRADABLE DRUG DELIVERY IMPLANT Left 01/18/2021  Procedure: INSERTION, KNEE, bioresorbable, biodegradable, NON-BIODEGRADABLE DRUG DELIVERY IMPLANT; Surgeon: Jiranek, William Arthur, MD; Location: [facility]; Service: Orthopedics; Laterality: Left;  ABOVE KNEE LEG AMPUTATION  12/02/2020  APPENDECTOMY Est 1968  CESAREAN SECTION  CHOLECYSTECTOMY  FRACTURE SURGERY 1/21  HYSTERECTOMY partial  JOINT REPLACEMENT 2008 & 2009  TUBAL LIGATION 3/79   ALLERGIES: Allergies  Allergen Reactions  Singulair [Montelukast] Other (See Comments)  Gland swelling  Sulfa (Sulfonamide Antibiotics) Swelling  Oxycodone Dizziness and Other (See Comments)  Vancomycin Analogues Other (See Comments)  Ringing in ears  Avinza [Morphine] Itching  Darvocet A500 [Propoxyphene N-Acetaminophen] Nausea  Nickel Rash  Ultracet [Tramadol-Acetaminophen] Rash  Vicodin [Hydrocodone-Acetaminophen] Vomiting  Vioxx [Rofecoxib] Other (See Comments)  GI   CURRENT MEDICATIONS: Current Outpatient Medications: acetaminophen (TYLENOL) 500 MG tablet, Take 1,000 mg by mouth 3 (three) times a day Twice daily per patient., PRN Not Currently Taking aspirin 81 MG EC tablet, Take 1 tablet (81 mg total) by mouth at bedtime, Taking calcium carbonate-vitamin D3 (CALTRATE 600+D) 600 mg-10 mcg (400 unit) tablet, Take 0.5 tablets by mouth 2 (two) times daily, Taking cetirizine (ZYRTEC) 10 mg capsule, Take 1 capsule (10 mg total) by mouth once daily, Taking escitalopram oxalate (LEXAPRO) 10 MG tablet, Take 1 tablet (10 mg total) by mouth once daily, Taking estradioL (ESTRACE) 0.01 % (0.1 mg/gram) vaginal cream, Place 2 g vaginally twice a  week, Taking folic acid/multivit,iron,miner (MULTIVIT-IRON-MIN-FOLIC ACID ORAL), Take 1 tablet by mouth once daily, Taking gabapentin (NEURONTIN) 300 MG capsule, Take 1 capsule (300 mg total) by mouth 3 (three) times daily Phantom limb pain, Taking melatonin 3 mg tablet, Take 1 tablet (3 mg total) by mouth at bedtime, Taking multivitamin with iron (COMPLETE MULTIVITAMIN-MINERAL) tablet, Take 1 tablet by mouth, Taking multivitamin with minerals, EYE, (PRESERVISION AREDS 2) soft gel capsule, Take 1 capsule by mouth 2 (two) times daily, Taking nystatin (MYCOSTATIN) 100,000 unit/gram powder, Apply small amount topically twice daily for rash under breasts, PRN Not Currently Taking pantoprazole (PROTONIX) 40 MG DR tablet, Take 1 tablet (40 mg total) by mouth 2 (two) times daily. Please call to schedule an appt. Thanks!, Taking polyethylene glycol (MIRALAX) powder, Take 17 g by mouth once daily Mix in 4-8ounces of fluid prior to taking., Taking sennosides-docusate (SENOKOT-S) 8.6-50 mg tablet, Take 1 tablet by mouth 2 (two) times daily For constipation, Taking lactose-reduced food (ENSURE PLUS ORAL), Take 237 mLs by mouth 2 (two) times daily With breakfast and dinner  HPI   CLINICAL SUMMARY:  Patient post right AKA and working with prosthesis. 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 Able to do housework, improving  PAIN:  Are you having pain? Yes: NPRS scale: 5/10 Pain location: L knee Pain description: aching Aggravating factors: walking Relieving factors: rest  PRECAUTIONS: None  WEIGHT BEARING RESTRICTIONS No  FALLS:  Has patient fallen in last 6 months? No  LIVING ENVIRONMENT:   Lives with: lives with their spouse Lives in: House/apartment Stairs: No Has following equipment at home: Gilford Rile - 4 wheeled and Wheelchair (manual)  OCCUPATION: retired  PLOF: Requires assistive device for independence  PATIENT GOALS  improve standing tolerance/ pull up  pants/ ambulate with [place] safely.     OBJECTIVE:   PATIENT SURVEYS:  FOTO initial 46/ goal 38.   11/2: 50  COGNITION:  Overall cognitive status: Within functional limits for tasks assessed     SENSATION: WFL  POSTURE: rounded shoulders, forward head, flexed trunk , and weight shift left  PALPATION: No tenderness along R distal residual limb.  Discussed phantom limb sensation.    LOWER EXTREMITY ROM:  B LE AROM WFL except L knee extension secondary to articulating spacer.  Did not assess L/R hip extension at this time.    LOWER EXTREMITY MMT:  MMT Right eval Left eval  Hip flexion 4/5 4/5  Hip extension    Hip abduction 4+/5 4+/5  Hip adduction 4+/5 4+/5  Hip internal rotation    Hip external rotation    Knee flexion N/A 4+/5  Knee extension N/A 4+/5  Ankle dorsiflexion  5/5  Ankle plantarflexion    Ankle inversion    Ankle eversion     (Blank rows = not tested)  GAIT: Distance walked: in gym/ //-bars Assistive device utilized: Environmental consultant - 2 wheeled Level of assistance: CGA Comments: Cuing for posture correction with mirror feedback.  Flexed posture/ heavy UE assist.     TODAY'S TREATMENT:  12/21/21  Subjective:  Pt. Arrived to PT in w/c and carrying prosthetic leg.  Pt. Reports no new complaints.  No ambulation at home due to no CGA for safety.    Objective:    There.ex.:    Pt. Transferred from w/c to blue mat to don prosthetic leg.        Nustep L4 10 min. With B UE/LE.  Use of R toe clip to improve placement of R foot.    Step ups/ down on 1st 6" step at stairs 10x.  Heavy UE assist for safety/ balance.     Seated marching 20x.  Reassessment of L knee AROM/ patellar mobility.    Gait training:    Ascend/ descend stairs x3 with step to pattern and SBA/CGA for safety.  Requires heavy UE assist for safety.    Ambulate in clinic/hallway (120 feet) with RW and CGA with cuing to correct head position/ upright posture.  No LOB and consistent R hip flexion/  step length.  Good control of R prosthetic leg/ knee mechanism.      Amb. From Nustep to car after tx. session.        PATIENT EDUCATION:  Education details: HEP/ gait and prosthetic training.  Person educated: Patient and Spouse Education method: Explanation, Demonstration, and Verbal cues Education comprehension: verbalized understanding, returned demonstration, and tactile cues required   HOME EXERCISE PROGRAM: Prone position hip stretches/ Standing marching at kitchen counter with w/c behind patient.      ASSESSMENT:  CLINICAL IMPRESSION: Pt. ambulates around PT clinic with use of prosthetic leg/ RW with consistent recip.  Good step to gait at clinic stairs today with no LOB but extra time/ focus to complete task.  PT discussed importance of walking/ carryover at home but husband is really fearful of falls/ inability to assist wife. Pt. Will benefit from skilled PT services to improve standing tolerance/ gait independence and ADL.    OBJECTIVE IMPAIRMENTS Abnormal gait, decreased activity tolerance,  decreased balance, decreased endurance, decreased mobility, difficulty walking, decreased ROM, decreased strength, decreased safety awareness, impaired flexibility, improper body mechanics, prosthetic dependency , and pain.   ACTIVITY LIMITATIONS carrying, lifting, standing, squatting, stairs, transfers, bed mobility, bathing, toileting, dressing, hygiene/grooming, and locomotion level  PARTICIPATION LIMITATIONS: cleaning, laundry, driving, shopping, community activity, and yard work  [place] Past/current experiences are also affecting patient's functional outcome.   REHAB POTENTIAL: Good  CLINICAL DECISION MAKING: Evolving/moderate complexity  EVALUATION COMPLEXITY: High   GOALS: Goals reviewed with patient? Yes  SHORT TERM GOALS: Target date: 10/07/21 Pt. Able to don/doff R prosthetic leg independently to improve ADL/standing tolerance.  Baseline:  min. A to don/ doff  prosthetic leg Goal status: Goal met   LONG TERM GOALS: Target date: 01/27/22  Pt. Will increase FOTO to 54 to improve functional mobility.  Baseline: initial FOTO 46.  11/2: 50 Goal status: Partially met  2.  Pt. Able to tolerate standing 10 minutes while pulling up pants with mod. I safely while wearing prosthetic leg to improve daily activities.  Baseline:  limited standing tolerance Goal status: Not met  3.  Pt. Will ambulate 200 feet with use of SPC and mod. I to promote safety with household tasks/ walking into bathroom.   Baseline:  amb. With RW and min. A for safety.  11/2:   Goal status: Not met   PLAN: PT FREQUENCY: 2x/week  PT DURATION: 8 weeks  PLANNED INTERVENTIONS: Therapeutic exercises, Therapeutic activity, Neuromuscular re-education, Balance training, Gait training, Patient/Family education, Self Care, Joint mobilization, Prosthetic training, DME instructions, and Manual therapy  PLAN FOR NEXT SESSION:  Progress gait/ step ups.    Pura Spice, PT, DPT # [phone] 12/29/2021, 11:01 AM

## 2021-12-30 ENCOUNTER — Encounter: Payer: Medicare Other | Admitting: Physical Therapy

## 2022-01-01 NOTE — Therapy (Addendum)
OUTPATIENT PHYSICAL THERAPY LOWER EXTREMITY TREATMENT   Patient Name: Jasmine Morrow MRN: [id number] DOB:21-Mar-1945, 76 y.o., female Today's Date: 12/29/2021   PT End of Session - 01/01/22 0836     Visit Number 26    Number of Visits 36    Date for PT Re-Evaluation 01/27/22    PT Start Time 1028    PT Stop Time 1136    PT Time Calculation (min) 68 min    Activity Tolerance Patient tolerated treatment well             Past Medical History:  Diagnosis Date   Anemia    Anxiety    Asthma    seasonal    GERD (gastroesophageal reflux disease)    Hypertension    PONV (postoperative nausea and vomiting)    Seasonal allergies    Sleep apnea    does not uses CPAP machine anymore    Past Surgical History:  Procedure Laterality Date   APPENDECTOMY     CESAREAN SECTION     CHOLECYSTECTOMY     DILATION AND CURETTAGE OF UTERUS     ESOPHAGOGASTRODUODENOSCOPY N/A 02/22/2019   Procedure: ESOPHAGOGASTRODUODENOSCOPY (EGD);  Surgeon: Toledo, Benay Pike, MD;  Location: [facility];  Service: Gastroenterology;  Laterality: N/A;   FLEXIBLE SIGMOIDOSCOPY N/A 02/22/2019   Procedure: FLEXIBLE SIGMOIDOSCOPY;  Surgeon: Toledo, Benay Pike, MD;  Location: [facility];  Service: Gastroenterology;  Laterality: N/A;   HARDWARE REMOVAL Right 11/12/2019   Procedure: Right ankle hardware removal;  Surgeon: Hessie Knows, MD;  Location: [facility];  Service: Orthopedics;  Laterality: Right;   HARDWARE REMOVAL Right 04/17/2020   Procedure: HARDWARE REMOVAL FROM RIGHT ANKLE AND RIGHT KNEE; IMPLANT OF CEMENT SPACERS IN RIGHT KNEE;  Surgeon: Dereck Leep, MD;  Location: [facility];  Service: Orthopedics;  Laterality: Right;   JOINT REPLACEMENT     ORIF ANKLE FRACTURE Right 03/14/2019   Procedure: OPEN REDUCTION INTERNAL FIXATION (ORIF) ANKLE FRACTURE, MEDIAL MALLEOLUS;  Surgeon: Hessie Knows, MD;  Location: [facility];  Service: Orthopedics;  Laterality: Right;   ORIF ANKLE FRACTURE Right 02/08/2019    Procedure: OPEN REDUCTION INTERNAL FIXATION (ORIF) ANKLE FRACTURE, post malleolus;  Surgeon: Hessie Knows, MD;  Location: [facility];  Service: Orthopedics;  Laterality: Right;   REPLACEMENT TOTAL KNEE Bilateral 2008   2009   SCAR DEBRIDEMENT OF TOTAL KNEE  01/2020   SYNDESMOSIS REPAIR Right 02/08/2019   Procedure: SYNDESMOSIS REPAIR;  Surgeon: Hessie Knows, MD;  Location: [facility];  Service: Orthopedics;  Laterality: Right;   TEE WITHOUT CARDIOVERSION N/A 02/26/2019   Procedure: TRANSESOPHAGEAL ECHOCARDIOGRAM (TEE);  Surgeon: Corey Skains, MD;  Location: [facility];  Service: Cardiovascular;  Laterality: N/A;   TOTAL KNEE REVISION WITH SCAR DEBRIDEMENT/PATELLA REVISION WITH POLY EXCHANGE Right 01/24/2020   Procedure: TOTAL KNEE REVISION WITH SCAR DEBRIDEMENT/PATELLA REVISION WITH POLY EXCHANGE;  Surgeon: Dereck Leep, MD;  Location: [facility];  Service: Orthopedics;  Laterality: Right;   TUMOR REMOVAL     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   Patient Active Problem List   Diagnosis Date Noted   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[place]) 04/17/2020   Anxiety 04/17/2020   History of GI diverticular bleed 04/17/2020   Chest pain 04/17/2020   PAD (peripheral artery disease) ([place]) 04/05/2020   Ulcer of right leg ([place]) 04/05/2020   COPD (chronic obstructive pulmonary disease) ([place]) 04/05/2020   GERD (gastroesophageal reflux disease) 04/05/2020   Abscess of right knee 01/22/2020   Pressure injury of skin 02/28/2019  SOB (shortness of breath)    Gastrointestinal hemorrhage    Sepsis (HCC)    Symptomatic anemia 02/20/2019   Trimalleolar fracture of ankle, closed, right, initial encounter 02/07/2019   Multiple lung nodules 03/31/2014   Extrinsic asthma 03/27/2014   OSA on CPAP 03/27/2014    PCP: Miller, Mark F, MD  REFERRING PROVIDER: Miller, Mark F, MD  REFERRING DIAG: Above knee amputation of right lower extremity  THERAPY DIAG:  S/P AKA (above knee amputation) unilateral, right  (HCC)  Muscle weakness (generalized)  Gait difficulty  Abnormal posture  Rationale for Evaluation and Treatment Rehabilitation  ONSET DATE: 12/02/20  SUBJECTIVE:  EVALUATION  PERTINENT HISTORY: Patient Profile:   Jasmine Morrow is a 76 y.o. female Chief Complaint  Patient presents with  Visit Follow Up  Doing well.   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 Cardiac cath 10/2011 showed completely normal coronary arteries, however she did have mild left ventricular dysfunction with anterior wall motion abnormality. Left ventricular EF was 51%.  History of chickenpox  Hyperlipidemia  patient states she has never been diagnosed with this  Hypertension  Knee joint replacement by other means  Macular degeneration  Major depressive disorder, recurrent, mild (CMS-HCC)  Medicare annual wellness visit, initial 08/09/2019  7/21  Multiple thyroid nodules  Normal coronary arteries 09/06/2013  Normal coronary anatomy by cardiac catheterization 10/12/11  Osteoarthritis  Ovarian cyst  PONV (postoperative nausea and vomiting)  nausea  Postoperative urinary retention 12/08/2020  Rash on lips 12/21/2020  Seasonal allergies  Sinusitis, unspecified  Sleep apnea  On CPAP.  Slow transit constipation 12/10/2020  Trimalleolar fracture of ankle, closed, right, initial encounter 02/07/2019  Complicated by  staph infection 1/21, 4 weeks IV Ancef, Menz  Ulcer   Past Surgical History:  Procedure Laterality Date  SALPINGO OOPHORECTOMY Bilateral 1993  Right total knee arthroplasty 10/11/2006  Dr Hooten  Left total knee arthroplasty 10/12/2007  Dr Hooten  COLONOSCOPY 07/30/2012  internal hemorrhoids, diverticulosis  ANKLE ARTHODESIS W/ ARTHROSCOPY Right 02/08/2019  03/14/2019 second surgery  COLONOSCOPY 02/22/2019  Blood in entire colon/Diverticulosis - Presumed diverticular bleed. No repeat recommended per TKT.  EGD 02/22/2019  Gastritis/gastric ulcers/Hiatal hernia/Otherwise normal - no repeat recommended per TKT.  ORIF ANKLE FRACTURE Right 03/14/2019  Dr. Menz  REMOVAL HARDWARE ANKLE FOOT/TOES Right 11/12/2019  Dr. Menz  Right knee arthrotomy, irrigation and debridement of the right knee, polyethylene exchange 01/24/2020  Dr Hooten  Removal of hardware (plates and screws) from the right ankle with irrigation, debridement, and placement of Stimulan antibiotic beads 04/17/2020  Dr Hooten  Right knee arthrotomy, extensive irrigation debridement, removal of right total knee implants, and placement of antibiotic impregnated polymethylmethacrylate cement spacer 04/17/2020  Dr Hooten  AMPUTATION LEG ABOVE KNEE AKA Right 12/02/2020  Procedure: AMPUTATION, THIGH, THROUGH FEMUR, ANY LEVEL; Surgeon: Chun, Danielle Sunae, MD; Location: [facility]; Service: Orthopedics; Laterality: Right;  TRANSFER ADJACENT TISSUE LEG Right 12/02/2020  Procedure: ADJACENT TISSUE TRANSFER OR REARRANGEMENT, LEG;   DEFECT 10 SQ CM OR LESS; Surgeon: Chun, Danielle Sunae, MD; Location: [facility]; Service: Orthopedics; Laterality: Right;  ASPIRATION/INJECTION MAJOR JOINT/BURSA KNEE Left 12/02/2020  Procedure: ARTHROCENTESIS, ASPIRATION AND/OR INJECTION, MAJOR JOINT OR BURSA, KNEE; WITHOUT ULTRASOUND GUIDANCE; Surgeon: Chun, Danielle Sunae, MD; Location: [facility]; Service: Orthopedics; Laterality: Left;  REMOVAL  KNEE PROSTHESIS W/POSSIBLE PLACEMENT SPACER Left 01/18/2021  Procedure: REMOVAL OF PROSTHESIS, INCLUDING TOTAL KNEE PROSTHESIS, METHYLMETHACRYLATE WITH OR WITHOUT INSERTION OF SPACER, KNEE; Surgeon: Jiranek, William Arthur, MD; Location: [facility]; Service: Orthopedics; Laterality: Left;  INSERTION NON-BIODEGRADABLE DRUG DELIVERY IMPLANT Left 01/18/2021  Procedure: INSERTION, KNEE, bioresorbable, biodegradable, NON-BIODEGRADABLE DRUG DELIVERY IMPLANT; Surgeon: Jiranek, William Arthur, MD; Location: [facility]; Service: Orthopedics; Laterality: Left;  ABOVE KNEE LEG AMPUTATION  12/02/2020  APPENDECTOMY Est 1968  CESAREAN SECTION  CHOLECYSTECTOMY  FRACTURE SURGERY 1/21  HYSTERECTOMY partial  JOINT REPLACEMENT 2008 & 2009  TUBAL LIGATION 3/79   ALLERGIES: Allergies  Allergen Reactions  Singulair [Montelukast] Other (See Comments)  Gland swelling  Sulfa (Sulfonamide Antibiotics) Swelling  Oxycodone Dizziness and Other (See Comments)  Vancomycin Analogues Other (See Comments)  Ringing in ears  Avinza [Morphine] Itching  Darvocet A500 [Propoxyphene N-Acetaminophen] Nausea  Nickel Rash  Ultracet [Tramadol-Acetaminophen] Rash  Vicodin [Hydrocodone-Acetaminophen] Vomiting  Vioxx [Rofecoxib] Other (See Comments)  GI   CURRENT MEDICATIONS: Current Outpatient Medications: acetaminophen (TYLENOL) 500 MG tablet, Take 1,000 mg by mouth 3 (three) times a day Twice daily per patient., PRN Not Currently Taking aspirin 81 MG EC tablet, Take 1 tablet (81 mg total) by mouth at bedtime, Taking calcium carbonate-vitamin D3 (CALTRATE 600+D) 600 mg-10 mcg (400 unit) tablet, Take 0.5 tablets by mouth 2 (two) times daily, Taking cetirizine (ZYRTEC) 10 mg capsule, Take 1 capsule (10 mg total) by mouth once daily, Taking escitalopram oxalate (LEXAPRO) 10 MG tablet, Take 1 tablet (10 mg total) by mouth once daily, Taking estradioL (ESTRACE) 0.01 % (0.1 mg/gram) vaginal cream, Place 2 g vaginally twice a  week, Taking folic acid/multivit,iron,miner (MULTIVIT-IRON-MIN-FOLIC ACID ORAL), Take 1 tablet by mouth once daily, Taking gabapentin (NEURONTIN) 300 MG capsule, Take 1 capsule (300 mg total) by mouth 3 (three) times daily Phantom limb pain, Taking melatonin 3 mg tablet, Take 1 tablet (3 mg total) by mouth at bedtime, Taking multivitamin with iron (COMPLETE MULTIVITAMIN-MINERAL) tablet, Take 1 tablet by mouth, Taking multivitamin with minerals, EYE, (PRESERVISION AREDS 2) soft gel capsule, Take 1 capsule by mouth 2 (two) times daily, Taking nystatin (MYCOSTATIN) 100,000 unit/gram powder, Apply small amount topically twice daily for rash under breasts, PRN Not Currently Taking pantoprazole (PROTONIX) 40 MG DR tablet, Take 1 tablet (40 mg total) by mouth 2 (two) times daily. Please call to schedule an appt. Thanks!, Taking polyethylene glycol (MIRALAX) powder, Take 17 g by mouth once daily Mix in 4-8ounces of fluid prior to taking., Taking sennosides-docusate (SENOKOT-S) 8.6-50 mg tablet, Take 1 tablet by mouth 2 (two) times daily For constipation, Taking lactose-reduced food (ENSURE PLUS ORAL), Take 237 mLs by mouth 2 (two) times daily With breakfast and dinner  HPI   CLINICAL SUMMARY:  Patient post right AKA and working with prosthesis. 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 Able to do housework, improving  PAIN:  Are you having pain? Yes: NPRS scale: 5/10 Pain location: L knee Pain description: aching Aggravating factors: walking Relieving factors: rest  PRECAUTIONS: None  WEIGHT BEARING RESTRICTIONS No  FALLS:  Has patient fallen in last 6 months? No  LIVING ENVIRONMENT:   Lives with: lives with their spouse Lives in: House/apartment Stairs: No Has following equipment at home: Gilford Rile - 4 wheeled and Wheelchair (manual)  OCCUPATION: retired  PLOF: Requires assistive device for independence  PATIENT GOALS  improve standing tolerance/ pull up  pants/ ambulate with [place] safely.     OBJECTIVE:   PATIENT SURVEYS:  FOTO initial 46/ goal 37.   11/2: 50  COGNITION:  Overall cognitive status: Within functional limits for tasks assessed     SENSATION: WFL  POSTURE: rounded shoulders, forward head, flexed trunk , and weight shift left  PALPATION: No tenderness along R distal residual limb.  Discussed phantom limb sensation.    LOWER EXTREMITY ROM:  B LE AROM WFL except L knee extension secondary to articulating spacer.  Did not assess L/R hip extension at this time.    LOWER EXTREMITY MMT:  MMT Right eval Left eval  Hip flexion 4/5 4/5  Hip extension    Hip abduction 4+/5 4+/5  Hip adduction 4+/5 4+/5  Hip internal rotation    Hip external rotation    Knee flexion N/A 4+/5  Knee extension N/A 4+/5  Ankle dorsiflexion  5/5  Ankle plantarflexion    Ankle inversion    Ankle eversion     (Blank rows = not tested)  GAIT: Distance walked: in gym/ //-bars Assistive device utilized: Environmental consultant - 2 wheeled Level of assistance: CGA Comments: Cuing for posture correction with mirror feedback.  Flexed posture/ heavy UE assist.     TODAY'S TREATMENT:  12/29/21  Subjective:  Pt. Arrived to PT in w/c and carrying prosthetic leg.  Pt. Reports no new complaints.  No ambulation at home due to no CGA for safety.    Objective:    There.ex.:    Pt. Transferred from w/c to blue mat to don prosthetic leg.   Walked from blue mat to [employer] with use of RW and SBA/CGA for safety with consistent recip. Gait pattern.  Good knee mechanism control.       Nustep L4 10+ min. With B UE/LE.  Use of R toe clip to improve placement of R foot.    Seated marching on gray chair/ STS in front of //-bars/ standing tolerance with added B shoulder flexion 10x2.  Lateral walking at //-bars 2x (moderate fatigue).     Gait training:    Ambulate in clinic/hallway (100 feet x 2) with RW and CGA with cuing to correct head position/ upright posture.  No  LOB and consistent R hip flexion/ step length.  Good control of R prosthetic leg/ knee mechanism.      Ascend/ descend stairs x3 with step to pattern and SBA/CGA for safety.  Fatigue noted but motivated to increase endurance.    Amb. From gray chair to car after tx. session.  Good hip flexion/ step length over threshold/ car in front of clinic.        PATIENT EDUCATION:  Education details: HEP/ gait and prosthetic training.  Person educated: Patient and Spouse Education method: Explanation, Demonstration, and Verbal cues Education comprehension: verbalized understanding, returned demonstration, and tactile cues required   HOME EXERCISE PROGRAM: Prone position hip stretches/ Standing marching at kitchen counter with w/c behind patient.      ASSESSMENT:  CLINICAL IMPRESSION: Pt. ambulates around PT clinic with use of prosthetic leg/ RW with consistent recip.  Good step to gait at clinic stairs today with no LOB but extra time/ focus to complete task.  PT discussed importance of walking at home/  carryover with PT tx.  Pt. Encouraged to stand at [employer] and work on balance.  Pt. Is planning to ask for assistance from granddaughter to assist with walking to increase safety.  Pt. Will benefit from skilled PT services to improve standing tolerance/ gait independence and ADL.    OBJECTIVE IMPAIRMENTS Abnormal gait, decreased activity tolerance, decreased balance, decreased endurance, decreased mobility, difficulty walking, decreased ROM, decreased strength, decreased safety awareness, impaired flexibility, improper body mechanics, prosthetic dependency , and pain.   ACTIVITY LIMITATIONS carrying, lifting, standing, squatting, stairs, transfers, bed mobility, bathing, toileting, dressing, hygiene/grooming, and locomotion level  PARTICIPATION LIMITATIONS: cleaning, laundry, driving, shopping, community activity, and yard work  [place] Past/current experiences are also affecting  patient's functional outcome.   REHAB POTENTIAL: Good  CLINICAL DECISION MAKING: Evolving/moderate complexity  EVALUATION COMPLEXITY: High   GOALS: Goals reviewed with patient? Yes  SHORT TERM GOALS: Target date: 10/07/21 Pt. Able to don/doff R prosthetic leg independently to improve ADL/standing tolerance.  Baseline:  min. A to don/ doff prosthetic leg Goal status: Goal met   LONG TERM GOALS: Target date: 01/27/22  Pt. Will increase FOTO to 54 to improve functional mobility.  Baseline: initial FOTO 46.  11/2: 50 Goal status: Partially met  2.  Pt. Able to tolerate standing 10 minutes while pulling up pants with mod. I safely while wearing prosthetic leg to improve daily activities.  Baseline:  limited standing tolerance Goal status: Not met  3.  Pt. Will ambulate 200 feet with use of SPC and mod. I to promote safety with household tasks/ walking into bathroom.   Baseline:  amb. With RW and min. A for safety.  11/2:   Goal status: Not met   PLAN: PT FREQUENCY: 2x/week  PT DURATION: 8 weeks  PLANNED INTERVENTIONS: Therapeutic exercises, Therapeutic activity, Neuromuscular re-education, Balance training, Gait training, Patient/Family education, Self Care, Joint mobilization, Prosthetic training, DME instructions, and Manual therapy  PLAN FOR NEXT SESSION:  Progress gait/ step ups.    Pura Spice, PT, DPT # [id number] 12/29/2021, 11:59 AM

## 2022-01-04 ENCOUNTER — Encounter: Payer: Medicare Other | Admitting: Physical Therapy

## 2022-01-06 ENCOUNTER — Ambulatory Visit: Payer: Medicare Other | Attending: Internal Medicine | Admitting: Physical Therapy

## 2022-01-06 DIAGNOSIS — Z89611 Acquired absence of right leg above knee: Secondary | ICD-10-CM

## 2022-01-06 DIAGNOSIS — R269 Unspecified abnormalities of gait and mobility: Secondary | ICD-10-CM | POA: Diagnosis present

## 2022-01-06 DIAGNOSIS — M6281 Muscle weakness (generalized): Secondary | ICD-10-CM | POA: Diagnosis present

## 2022-01-06 DIAGNOSIS — R293 Abnormal posture: Secondary | ICD-10-CM

## 2022-01-10 NOTE — Therapy (Signed)
OUTPATIENT PHYSICAL THERAPY LOWER EXTREMITY TREATMENT   Patient Name: Jasmine Morrow MRN: [id number] DOB:10/02/1945, 76 y.o., female Today's Date: 01/06/2022   PT End of Session - 01/10/22 2004     Visit Number 27    Number of Visits 36    Date for PT Re-Evaluation 01/27/22    PT Start Time 1016    PT Stop Time 1127    PT Time Calculation (min) 71 min    Activity Tolerance Patient tolerated treatment well             Past Medical History:  Diagnosis Date   Anemia    Anxiety    Asthma    seasonal    GERD (gastroesophageal reflux disease)    Hypertension    PONV (postoperative nausea and vomiting)    Seasonal allergies    Sleep apnea    does not uses CPAP machine anymore    Past Surgical History:  Procedure Laterality Date   APPENDECTOMY     CESAREAN SECTION     CHOLECYSTECTOMY     DILATION AND CURETTAGE OF UTERUS     ESOPHAGOGASTRODUODENOSCOPY N/A 02/22/2019   Procedure: ESOPHAGOGASTRODUODENOSCOPY (EGD);  Surgeon: Toledo, Teodoro K, MD;  Location: [facility];  Service: Gastroenterology;  Laterality: N/A;   FLEXIBLE SIGMOIDOSCOPY N/A 02/22/2019   Procedure: FLEXIBLE SIGMOIDOSCOPY;  Surgeon: Toledo, Teodoro K, MD;  Location: [facility];  Service: Gastroenterology;  Laterality: N/A;   HARDWARE REMOVAL Right 11/12/2019   Procedure: Right ankle hardware removal;  Surgeon: Menz, Jerome Viglione, MD;  Location: [facility];  Service: Orthopedics;  Laterality: Right;   HARDWARE REMOVAL Right 04/17/2020   Procedure: HARDWARE REMOVAL FROM RIGHT ANKLE AND RIGHT KNEE; IMPLANT OF CEMENT SPACERS IN RIGHT KNEE;  Surgeon: Hooten, James P, MD;  Location: [facility];  Service: Orthopedics;  Laterality: Right;   JOINT REPLACEMENT     ORIF ANKLE FRACTURE Right 03/14/2019   Procedure: OPEN REDUCTION INTERNAL FIXATION (ORIF) ANKLE FRACTURE, MEDIAL MALLEOLUS;  Surgeon: Menz, Rosalena Mccorry, MD;  Location: [facility];  Service: Orthopedics;  Laterality: Right;   ORIF ANKLE FRACTURE Right 02/08/2019    Procedure: OPEN REDUCTION INTERNAL FIXATION (ORIF) ANKLE FRACTURE, post malleolus;  Surgeon: Menz, Kalyn Dimattia, MD;  Location: [facility];  Service: Orthopedics;  Laterality: Right;   REPLACEMENT TOTAL KNEE Bilateral 2008   2009   SCAR DEBRIDEMENT OF TOTAL KNEE  01/2020   SYNDESMOSIS REPAIR Right 02/08/2019   Procedure: SYNDESMOSIS REPAIR;  Surgeon: Menz, Jelicia Nantz, MD;  Location: [facility];  Service: Orthopedics;  Laterality: Right;   TEE WITHOUT CARDIOVERSION N/A 02/26/2019   Procedure: TRANSESOPHAGEAL ECHOCARDIOGRAM (TEE);  Surgeon: Kowalski, Bruce J, MD;  Location: [facility];  Service: Cardiovascular;  Laterality: N/A;   TOTAL KNEE REVISION WITH SCAR DEBRIDEMENT/PATELLA REVISION WITH POLY EXCHANGE Right 01/24/2020   Procedure: TOTAL KNEE REVISION WITH SCAR DEBRIDEMENT/PATELLA REVISION WITH POLY EXCHANGE;  Surgeon: Hooten, James P, MD;  Location: [facility];  Service: Orthopedics;  Laterality: Right;   TUMOR REMOVAL     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   Patient Active Problem List   Diagnosis Date Noted   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   Anxiety 04/17/2020   History of GI diverticular bleed 04/17/2020   Chest pain 04/17/2020   PAD (peripheral artery disease) (HCC) 04/05/2020   Ulcer of right leg (HCC) 04/05/2020   COPD (chronic obstructive pulmonary disease) (HCC) 04/05/2020   GERD (gastroesophageal reflux disease) 04/05/2020   Abscess of right knee 01/22/2020   Pressure injury of skin 02/28/2019     SOB (shortness of breath)    Gastrointestinal hemorrhage    Sepsis (HCC)    Symptomatic anemia 02/20/2019   Trimalleolar fracture of ankle, closed, right, initial encounter 02/07/2019   Multiple lung nodules 03/31/2014   Extrinsic asthma 03/27/2014   OSA on CPAP 03/27/2014    PCP: Miller, Mark F, MD  REFERRING PROVIDER: Miller, Mark F, MD  REFERRING DIAG: Above knee amputation of right lower extremity  THERAPY DIAG:  S/P AKA (above knee amputation) unilateral, right  (HCC)  Muscle weakness (generalized)  Gait difficulty  Abnormal posture  Rationale for Evaluation and Treatment Rehabilitation  ONSET DATE: 12/02/20  SUBJECTIVE:  EVALUATION  PERTINENT HISTORY: Patient Profile:   Jasmine Morrow is a 76 y.o. female Chief Complaint  Patient presents with  Visit Follow Up  Doing well.   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 Cardiac cath 10/2011 showed completely normal coronary arteries, however she did have mild left ventricular dysfunction with anterior wall motion abnormality. Left ventricular EF was 51%.  History of chickenpox  Hyperlipidemia  patient states she has never been diagnosed with this  Hypertension  Knee joint replacement by other means  Macular degeneration  Major depressive disorder, recurrent, mild (CMS-HCC)  Medicare annual wellness visit, initial 08/09/2019  7/21  Multiple thyroid nodules  Normal coronary arteries 09/06/2013  Normal coronary anatomy by cardiac catheterization 10/12/11  Osteoarthritis  Ovarian cyst  PONV (postoperative nausea and vomiting)  nausea  Postoperative urinary retention 12/08/2020  Rash on lips 12/21/2020  Seasonal allergies  Sinusitis, unspecified  Sleep apnea  On CPAP.  Slow transit constipation 12/10/2020  Trimalleolar fracture of ankle, closed, right, initial encounter 02/07/2019  Complicated by  staph infection 1/21, 4 weeks IV Ancef, Menz  Ulcer   Past Surgical History:  Procedure Laterality Date  SALPINGO OOPHORECTOMY Bilateral 1993  Right total knee arthroplasty 10/11/2006  Dr Hooten  Left total knee arthroplasty 10/12/2007  Dr Hooten  COLONOSCOPY 07/30/2012  internal hemorrhoids, diverticulosis  ANKLE ARTHODESIS W/ ARTHROSCOPY Right 02/08/2019  03/14/2019 second surgery  COLONOSCOPY 02/22/2019  Blood in entire colon/Diverticulosis - Presumed diverticular bleed. No repeat recommended per TKT.  EGD 02/22/2019  Gastritis/gastric ulcers/Hiatal hernia/Otherwise normal - no repeat recommended per TKT.  ORIF ANKLE FRACTURE Right 03/14/2019  Dr. Menz  REMOVAL HARDWARE ANKLE FOOT/TOES Right 11/12/2019  Dr. Menz  Right knee arthrotomy, irrigation and debridement of the right knee, polyethylene exchange 01/24/2020  Dr Hooten  Removal of hardware (plates and screws) from the right ankle with irrigation, debridement, and placement of Stimulan antibiotic beads 04/17/2020  Dr Hooten  Right knee arthrotomy, extensive irrigation debridement, removal of right total knee implants, and placement of antibiotic impregnated polymethylmethacrylate cement spacer 04/17/2020  Dr Hooten  AMPUTATION LEG ABOVE KNEE AKA Right 12/02/2020  Procedure: AMPUTATION, THIGH, THROUGH FEMUR, ANY LEVEL; Surgeon: Chun, Danielle Sunae, MD; Location: [facility]; Service: Orthopedics; Laterality: Right;  TRANSFER ADJACENT TISSUE LEG Right 12/02/2020  Procedure: ADJACENT TISSUE TRANSFER OR REARRANGEMENT, LEG;   DEFECT 10 SQ CM OR LESS; Surgeon: Lyndle Herrlich, MD; Location: [place]; Service: Orthopedics; Laterality: Right;  ASPIRATION/INJECTION MAJOR JOINT/BURSA KNEE Left 12/02/2020  Procedure: ARTHROCENTESIS, ASPIRATION AND/OR INJECTION, MAJOR JOINT OR BURSA, KNEE; WITHOUT ULTRASOUND GUIDANCE; Surgeon: Lyndle Herrlich, MD; Location: [place]; Service: Orthopedics; Laterality: Left;  REMOVAL  KNEE PROSTHESIS W/POSSIBLE PLACEMENT SPACER Left 01/18/2021  Procedure: REMOVAL OF PROSTHESIS, INCLUDING TOTAL KNEE PROSTHESIS, METHYLMETHACRYLATE WITH OR WITHOUT INSERTION OF SPACER, KNEE; Surgeon: Ihor Dow, MD; Location: [place]; Service: Orthopedics; Laterality: Left;  INSERTION NON-BIODEGRADABLE DRUG DELIVERY IMPLANT Left 01/18/2021  Procedure: INSERTION, KNEE, bioresorbable, biodegradable, NON-BIODEGRADABLE DRUG DELIVERY IMPLANT; Surgeon: Ihor Dow, MD; Location: [place]; Service: Orthopedics; Laterality: Left;  ABOVE KNEE LEG AMPUTATION  12/02/2020  APPENDECTOMY Est 1968  CESAREAN SECTION  CHOLECYSTECTOMY  FRACTURE SURGERY 1/21  HYSTERECTOMY partial  JOINT REPLACEMENT 2008 & 2009  TUBAL LIGATION 3/79   ALLERGIES: Allergies  Allergen Reactions  Singulair [Montelukast] Other (See Comments)  Gland swelling  Sulfa (Sulfonamide Antibiotics) Swelling  Oxycodone Dizziness and Other (See Comments)  Vancomycin Analogues Other (See Comments)  Ringing in ears  Avinza [Morphine] Itching  Darvocet A500 [Propoxyphene N-Acetaminophen] Nausea  Nickel Rash  Ultracet [Tramadol-Acetaminophen] Rash  Vicodin [Hydrocodone-Acetaminophen] Vomiting  Vioxx [Rofecoxib] Other (See Comments)  GI   CURRENT MEDICATIONS: Current Outpatient Medications: acetaminophen (TYLENOL) 500 MG tablet, Take 1,000 mg by mouth 3 (three) times a day Twice daily per patient., PRN Not Currently Taking aspirin 81 MG EC tablet, Take 1 tablet (81 mg total) by mouth at bedtime, Taking calcium carbonate-vitamin D3 (CALTRATE 600+D) 600 mg-10 mcg (400 unit) tablet, Take 0.5 tablets by mouth 2 (two) times daily, Taking cetirizine (ZYRTEC) 10 mg capsule, Take 1 capsule (10 mg total) by mouth once daily, Taking escitalopram oxalate (LEXAPRO) 10 MG tablet, Take 1 tablet (10 mg total) by mouth once daily, Taking estradioL (ESTRACE) 0.01 % (0.1 mg/gram) vaginal cream, Place 2 g vaginally twice a  week, Taking folic acid/multivit,iron,miner (MULTIVIT-IRON-MIN-FOLIC ACID ORAL), Take 1 tablet by mouth once daily, Taking gabapentin (NEURONTIN) 300 MG capsule, Take 1 capsule (300 mg total) by mouth 3 (three) times daily Phantom limb pain, Taking melatonin 3 mg tablet, Take 1 tablet (3 mg total) by mouth at bedtime, Taking multivitamin with iron (COMPLETE MULTIVITAMIN-MINERAL) tablet, Take 1 tablet by mouth, Taking multivitamin with minerals, EYE, (PRESERVISION AREDS 2) soft gel capsule, Take 1 capsule by mouth 2 (two) times daily, Taking nystatin (MYCOSTATIN) 100,000 unit/gram powder, Apply small amount topically twice daily for rash under breasts, PRN Not Currently Taking pantoprazole (PROTONIX) 40 MG DR tablet, Take 1 tablet (40 mg total) by mouth 2 (two) times daily. Please call to schedule an appt. Thanks!, Taking polyethylene glycol (MIRALAX) powder, Take 17 g by mouth once daily Mix in 4-8ounces of fluid prior to taking., Taking sennosides-docusate (SENOKOT-S) 8.6-50 mg tablet, Take 1 tablet by mouth 2 (two) times daily For constipation, Taking lactose-reduced food (ENSURE PLUS ORAL), Take 237 mLs by mouth 2 (two) times daily With breakfast and dinner  HPI   CLINICAL SUMMARY:  Patient post right AKA and working with prosthesis. 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 Able to do housework, improving  PAIN:  Are you having pain? Yes: NPRS scale: 5/10 Pain location: L knee Pain description: aching Aggravating factors: walking Relieving factors: rest  PRECAUTIONS: None  WEIGHT BEARING RESTRICTIONS No  FALLS:  Has patient fallen in last 6 months? No  LIVING ENVIRONMENT:  Lives with: lives with their spouse Lives in: House/apartment Stairs: No Has following equipment at home: Gilford Rile - 4 wheeled and Wheelchair (manual)  OCCUPATION: retired  PLOF: Requires assistive device for independence  PATIENT GOALS  improve standing tolerance/ pull up  pants/ ambulate with [place] safely.     OBJECTIVE:   PATIENT SURVEYS:  FOTO initial 46/ goal 88.   11/2: 50  COGNITION:  Overall cognitive status: Within functional limits for tasks assessed     SENSATION: WFL  POSTURE: rounded shoulders, forward head, flexed trunk , and weight shift left  PALPATION: No tenderness along R distal residual limb.  Discussed phantom limb sensation.    LOWER EXTREMITY ROM:  B LE AROM WFL except L knee extension secondary to articulating spacer.  Did not assess L/R hip extension at this time.    LOWER EXTREMITY MMT:  MMT Right eval Left eval  Hip flexion 4/5 4/5  Hip extension    Hip abduction 4+/5 4+/5  Hip adduction 4+/5 4+/5  Hip internal rotation    Hip external rotation    Knee flexion N/A 4+/5  Knee extension N/A 4+/5  Ankle dorsiflexion  5/5  Ankle plantarflexion    Ankle inversion    Ankle eversion     (Blank rows = not tested)  GAIT: Distance walked: in gym/ //-bars Assistive device utilized: Environmental consultant - 2 wheeled Level of assistance: CGA Comments: Cuing for posture correction with mirror feedback.  Flexed posture/ heavy UE assist.     TODAY'S TREATMENT:  01/06/22  Subjective:  Pt. Arrived to PT in w/c and carrying prosthetic leg.  Pt. Reports no new complaints.  No ambulation at home due to no CGA for safety.    Objective:    There.ex.:    Pt. Transferred from w/c to blue mat to don prosthetic leg.   Walked from blue mat to [employer] with use of RW and SBA/CGA for safety with consistent recip. Gait pattern.  Good knee mechanism control.       Nustep L4 10+ min. With B UE/LE.  Use of R toe clip to improve placement of R foot.    Seated marching on gray chair/ STS in front of //-bars/ standing tolerance with added B shoulder flexion 10x2.  Lateral walking at //-bars 2x (moderate fatigue).     Gait training:    Ambulate in gym with RW and CGA with cuing to correct head position/ upright posture.  No LOB and consistent R hip  flexion/ step length.  Good control of R prosthetic leg/ knee mechanism.      Ascend/ descend stairs x3 with step to pattern and SBA/CGA for safety.  Fatigue noted but motivated to increase endurance.    Amb. From gray chair to car after tx. session.  Good hip flexion/ step length over threshold/ car in front of clinic.        PATIENT EDUCATION:  Education details: HEP/ gait and prosthetic training.  Person educated: Patient and Spouse Education method: Explanation, Demonstration, and Verbal cues Education comprehension: verbalized understanding, returned demonstration, and tactile cues required   HOME EXERCISE PROGRAM: Prone position hip stretches/ Standing marching at kitchen counter with w/c behind patient.      ASSESSMENT:  CLINICAL IMPRESSION: Pt. ambulates around PT clinic with use of prosthetic leg/ RW with consistent recip.  Good step to gait at clinic stairs today with no LOB but extra time/ focus to complete task.  PT discussed importance of walking at home/ carryover with PT tx.  Pt. Encouraged to stand at [employer] and work on balance.  Pt. Is planning to ask for assistance from granddaughter to assist with walking to increase safety.  Pt. Will benefit from skilled PT services to improve standing tolerance/ gait independence and ADL.    OBJECTIVE IMPAIRMENTS Abnormal gait, decreased activity tolerance, decreased balance, decreased endurance, decreased mobility, difficulty walking, decreased ROM, decreased strength, decreased safety awareness, impaired flexibility, improper body mechanics, prosthetic dependency , and pain.   ACTIVITY LIMITATIONS carrying, lifting, standing, squatting, stairs, transfers, bed mobility, bathing, toileting, dressing, hygiene/grooming, and locomotion level  PARTICIPATION LIMITATIONS: cleaning, laundry, driving, shopping, community activity, and yard work  [place] Past/current experiences are also affecting patient's functional  outcome.   REHAB POTENTIAL: Good  CLINICAL DECISION MAKING: Evolving/moderate complexity  EVALUATION COMPLEXITY: High   GOALS: Goals reviewed with patient? Yes  SHORT TERM GOALS: Target date: 10/07/21 Pt. Able to don/doff R prosthetic leg independently to improve ADL/standing tolerance.  Baseline:  min. A to don/ doff prosthetic leg Goal status: Goal met   LONG TERM GOALS: Target date: 01/27/22  Pt. Will increase FOTO to 54 to improve functional mobility.  Baseline: initial FOTO 46.  11/2: 50 Goal status: Partially met  2.  Pt. Able to tolerate standing 10 minutes while pulling up pants with mod. I safely while wearing prosthetic leg to improve daily activities.  Baseline:  limited standing tolerance Goal status: Not met  3.  Pt. Will ambulate 200 feet with use of SPC and mod. I to promote safety with household tasks/ walking into bathroom.   Baseline:  amb. With RW and min. A for safety.  11/2:   Goal status: Not met   PLAN: PT FREQUENCY: 2x/week  PT DURATION: 8 weeks  PLANNED INTERVENTIONS: Therapeutic exercises, Therapeutic activity, Neuromuscular re-education, Balance training, Gait training, Patient/Family education, Self Care, Joint mobilization, Prosthetic training, DME instructions, and Manual therapy  PLAN FOR NEXT SESSION:  Progress gait/ step ups.    Pura Spice, PT, DPT # [phone] 12/29/2021, 11:59 AM

## 2022-01-11 ENCOUNTER — Ambulatory Visit: Payer: Medicare Other | Admitting: Physical Therapy

## 2022-01-18 ENCOUNTER — Encounter: Payer: Medicare Other | Admitting: Physical Therapy

## 2022-01-25 ENCOUNTER — Ambulatory Visit: Payer: Medicare Other | Admitting: Physical Therapy

## 2022-01-27 ENCOUNTER — Ambulatory Visit: Payer: Medicare Other | Admitting: Physical Therapy

## 2022-01-27 DIAGNOSIS — Z89611 Acquired absence of right leg above knee: Secondary | ICD-10-CM

## 2022-01-27 DIAGNOSIS — R293 Abnormal posture: Secondary | ICD-10-CM

## 2022-01-27 DIAGNOSIS — R269 Unspecified abnormalities of gait and mobility: Secondary | ICD-10-CM

## 2022-01-27 DIAGNOSIS — M6281 Muscle weakness (generalized): Secondary | ICD-10-CM

## 2022-01-28 NOTE — Therapy (Signed)
OUTPATIENT PHYSICAL THERAPY LOWER EXTREMITY TREATMENT   Patient Name: Jasmine Morrow MRN: [id number] DOB:July 10, 1945, 76 y.o., female Today's Date: 01/27/2022   PT End of Session - 01/28/22 1117     Visit Number 28    Number of Visits 10    Date for PT Re-Evaluation 01/27/22    PT Start Time 1556    PT Stop Time 1646    PT Time Calculation (min) 50 min    Activity Tolerance Patient tolerated treatment well             Past Medical History:  Diagnosis Date   Anemia    Anxiety    Asthma    seasonal    GERD (gastroesophageal reflux disease)    Hypertension    PONV (postoperative nausea and vomiting)    Seasonal allergies    Sleep apnea    does not uses CPAP machine anymore    Past Surgical History:  Procedure Laterality Date   APPENDECTOMY     CESAREAN SECTION     CHOLECYSTECTOMY     DILATION AND CURETTAGE OF UTERUS     ESOPHAGOGASTRODUODENOSCOPY N/A 02/22/2019   Procedure: ESOPHAGOGASTRODUODENOSCOPY (EGD);  Surgeon: Toledo, Benay Pike, MD;  Location: [facility];  Service: Gastroenterology;  Laterality: N/A;   FLEXIBLE SIGMOIDOSCOPY N/A 02/22/2019   Procedure: FLEXIBLE SIGMOIDOSCOPY;  Surgeon: Toledo, Benay Pike, MD;  Location: [facility];  Service: Gastroenterology;  Laterality: N/A;   HARDWARE REMOVAL Right 11/12/2019   Procedure: Right ankle hardware removal;  Surgeon: Hessie Knows, MD;  Location: [facility];  Service: Orthopedics;  Laterality: Right;   HARDWARE REMOVAL Right 04/17/2020   Procedure: HARDWARE REMOVAL FROM RIGHT ANKLE AND RIGHT KNEE; IMPLANT OF CEMENT SPACERS IN RIGHT KNEE;  Surgeon: Dereck Leep, MD;  Location: [facility];  Service: Orthopedics;  Laterality: Right;   JOINT REPLACEMENT     ORIF ANKLE FRACTURE Right 03/14/2019   Procedure: OPEN REDUCTION INTERNAL FIXATION (ORIF) ANKLE FRACTURE, MEDIAL MALLEOLUS;  Surgeon: Hessie Knows, MD;  Location: [facility];  Service: Orthopedics;  Laterality: Right;   ORIF ANKLE FRACTURE Right 02/08/2019    Procedure: OPEN REDUCTION INTERNAL FIXATION (ORIF) ANKLE FRACTURE, post malleolus;  Surgeon: Hessie Knows, MD;  Location: [facility];  Service: Orthopedics;  Laterality: Right;   REPLACEMENT TOTAL KNEE Bilateral 2008   2009   SCAR DEBRIDEMENT OF TOTAL KNEE  01/2020   SYNDESMOSIS REPAIR Right 02/08/2019   Procedure: SYNDESMOSIS REPAIR;  Surgeon: Hessie Knows, MD;  Location: [facility];  Service: Orthopedics;  Laterality: Right;   TEE WITHOUT CARDIOVERSION N/A 02/26/2019   Procedure: TRANSESOPHAGEAL ECHOCARDIOGRAM (TEE);  Surgeon: Corey Skains, MD;  Location: [facility];  Service: Cardiovascular;  Laterality: N/A;   TOTAL KNEE REVISION WITH SCAR DEBRIDEMENT/PATELLA REVISION WITH POLY EXCHANGE Right 01/24/2020   Procedure: TOTAL KNEE REVISION WITH SCAR DEBRIDEMENT/PATELLA REVISION WITH POLY EXCHANGE;  Surgeon: Dereck Leep, MD;  Location: [facility];  Service: Orthopedics;  Laterality: Right;   TUMOR REMOVAL     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   Patient Active Problem List   Diagnosis Date Noted   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[place]) 04/17/2020   Anxiety 04/17/2020   History of GI diverticular bleed 04/17/2020   Chest pain 04/17/2020   PAD (peripheral artery disease) ([place]) 04/05/2020   Ulcer of right leg ([place]) 04/05/2020   COPD (chronic obstructive pulmonary disease) ([place]) 04/05/2020   GERD (gastroesophageal reflux disease) 04/05/2020   Abscess of right knee 01/22/2020   Pressure injury of skin 02/28/2019  SOB (shortness of breath)    Gastrointestinal hemorrhage    Sepsis (HCC)    Symptomatic anemia 02/20/2019   Trimalleolar fracture of ankle, closed, right, initial encounter 02/07/2019   Multiple lung nodules 03/31/2014   Extrinsic asthma 03/27/2014   OSA on CPAP 03/27/2014    PCP: Miller, Mark F, MD  REFERRING PROVIDER: Miller, Mark F, MD  REFERRING DIAG: Above knee amputation of right lower extremity  THERAPY DIAG:  S/P AKA (above knee amputation) unilateral, right  (HCC)  Muscle weakness (generalized)  Gait difficulty  Abnormal posture  Rationale for Evaluation and Treatment Rehabilitation  ONSET DATE: 12/02/20  SUBJECTIVE:  EVALUATION  PERTINENT HISTORY: Patient Profile:   Jasmine Morrow is a 76 y.o. female Chief Complaint  Patient presents with  Visit Follow Up  Doing well.   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 Cardiac cath 10/2011 showed completely normal coronary arteries, however she did have mild left ventricular dysfunction with anterior wall motion abnormality. Left ventricular EF was 51%.  History of chickenpox  Hyperlipidemia  patient states she has never been diagnosed with this  Hypertension  Knee joint replacement by other means  Macular degeneration  Major depressive disorder, recurrent, mild (CMS-HCC)  Medicare annual wellness visit, initial 08/09/2019  7/21  Multiple thyroid nodules  Normal coronary arteries 09/06/2013  Normal coronary anatomy by cardiac catheterization 10/12/11  Osteoarthritis  Ovarian cyst  PONV (postoperative nausea and vomiting)  nausea  Postoperative urinary retention 12/08/2020  Rash on lips 12/21/2020  Seasonal allergies  Sinusitis, unspecified  Sleep apnea  On CPAP.  Slow transit constipation 12/10/2020  Trimalleolar fracture of ankle, closed, right, initial encounter 02/07/2019  Complicated by  staph infection 1/21, 4 weeks IV Ancef, Menz  Ulcer   Past Surgical History:  Procedure Laterality Date  SALPINGO OOPHORECTOMY Bilateral 1993  Right total knee arthroplasty 10/11/2006  Dr Hooten  Left total knee arthroplasty 10/12/2007  Dr Hooten  COLONOSCOPY 07/30/2012  internal hemorrhoids, diverticulosis  ANKLE ARTHODESIS W/ ARTHROSCOPY Right 02/08/2019  03/14/2019 second surgery  COLONOSCOPY 02/22/2019  Blood in entire colon/Diverticulosis - Presumed diverticular bleed. No repeat recommended per TKT.  EGD 02/22/2019  Gastritis/gastric ulcers/Hiatal hernia/Otherwise normal - no repeat recommended per TKT.  ORIF ANKLE FRACTURE Right 03/14/2019  Dr. Menz  REMOVAL HARDWARE ANKLE FOOT/TOES Right 11/12/2019  Dr. Menz  Right knee arthrotomy, irrigation and debridement of the right knee, polyethylene exchange 01/24/2020  Dr Hooten  Removal of hardware (plates and screws) from the right ankle with irrigation, debridement, and placement of Stimulan antibiotic beads 04/17/2020  Dr Hooten  Right knee arthrotomy, extensive irrigation debridement, removal of right total knee implants, and placement of antibiotic impregnated polymethylmethacrylate cement spacer 04/17/2020  Dr Hooten  AMPUTATION LEG ABOVE KNEE AKA Right 12/02/2020  Procedure: AMPUTATION, THIGH, THROUGH FEMUR, ANY LEVEL; Surgeon: Chun, Danielle Sunae, MD; Location: [facility]; Service: Orthopedics; Laterality: Right;  TRANSFER ADJACENT TISSUE LEG Right 12/02/2020  Procedure: ADJACENT TISSUE TRANSFER OR REARRANGEMENT, LEG;   DEFECT 10 SQ CM OR LESS; Surgeon: Chun, Danielle Sunae, MD; Location: [facility]; Service: Orthopedics; Laterality: Right;  ASPIRATION/INJECTION MAJOR JOINT/BURSA KNEE Left 12/02/2020  Procedure: ARTHROCENTESIS, ASPIRATION AND/OR INJECTION, MAJOR JOINT OR BURSA, KNEE; WITHOUT ULTRASOUND GUIDANCE; Surgeon: Chun, Danielle Sunae, MD; Location: [facility]; Service: Orthopedics; Laterality: Left;  REMOVAL  KNEE PROSTHESIS W/POSSIBLE PLACEMENT SPACER Left 01/18/2021  Procedure: REMOVAL OF PROSTHESIS, INCLUDING TOTAL KNEE PROSTHESIS, METHYLMETHACRYLATE WITH OR WITHOUT INSERTION OF SPACER, KNEE; Surgeon: Jiranek, William Arthur, MD; Location: [facility]; Service: Orthopedics; Laterality: Left;  INSERTION NON-BIODEGRADABLE DRUG DELIVERY IMPLANT Left 01/18/2021  Procedure: INSERTION, KNEE, bioresorbable, biodegradable, NON-BIODEGRADABLE DRUG DELIVERY IMPLANT; Surgeon: Jiranek, William Arthur, MD; Location: [facility]; Service: Orthopedics; Laterality: Left;  ABOVE KNEE LEG AMPUTATION  12/02/2020  APPENDECTOMY Est 1968  CESAREAN SECTION  CHOLECYSTECTOMY  FRACTURE SURGERY 1/21  HYSTERECTOMY partial  JOINT REPLACEMENT 2008 & 2009  TUBAL LIGATION 3/79   ALLERGIES: Allergies  Allergen Reactions  Singulair [Montelukast] Other (See Comments)  Gland swelling  Sulfa (Sulfonamide Antibiotics) Swelling  Oxycodone Dizziness and Other (See Comments)  Vancomycin Analogues Other (See Comments)  Ringing in ears  Avinza [Morphine] Itching  Darvocet A500 [Propoxyphene N-Acetaminophen] Nausea  Nickel Rash  Ultracet [Tramadol-Acetaminophen] Rash  Vicodin [Hydrocodone-Acetaminophen] Vomiting  Vioxx [Rofecoxib] Other (See Comments)  GI   CURRENT MEDICATIONS: Current Outpatient Medications: acetaminophen (TYLENOL) 500 MG tablet, Take 1,000 mg by mouth 3 (three) times a day Twice daily per patient., PRN Not Currently Taking aspirin 81 MG EC tablet, Take 1 tablet (81 mg total) by mouth at bedtime, Taking calcium carbonate-vitamin D3 (CALTRATE 600+D) 600 mg-10 mcg (400 unit) tablet, Take 0.5 tablets by mouth 2 (two) times daily, Taking cetirizine (ZYRTEC) 10 mg capsule, Take 1 capsule (10 mg total) by mouth once daily, Taking escitalopram oxalate (LEXAPRO) 10 MG tablet, Take 1 tablet (10 mg total) by mouth once daily, Taking estradioL (ESTRACE) 0.01 % (0.1 mg/gram) vaginal cream, Place 2 g vaginally twice a  week, Taking folic acid/multivit,iron,miner (MULTIVIT-IRON-MIN-FOLIC ACID ORAL), Take 1 tablet by mouth once daily, Taking gabapentin (NEURONTIN) 300 MG capsule, Take 1 capsule (300 mg total) by mouth 3 (three) times daily Phantom limb pain, Taking melatonin 3 mg tablet, Take 1 tablet (3 mg total) by mouth at bedtime, Taking multivitamin with iron (COMPLETE MULTIVITAMIN-MINERAL) tablet, Take 1 tablet by mouth, Taking multivitamin with minerals, EYE, (PRESERVISION AREDS 2) soft gel capsule, Take 1 capsule by mouth 2 (two) times daily, Taking nystatin (MYCOSTATIN) 100,000 unit/gram powder, Apply small amount topically twice daily for rash under breasts, PRN Not Currently Taking pantoprazole (PROTONIX) 40 MG DR tablet, Take 1 tablet (40 mg total) by mouth 2 (two) times daily. Please call to schedule an appt. Thanks!, Taking polyethylene glycol (MIRALAX) powder, Take 17 g by mouth once daily Mix in 4-8ounces of fluid prior to taking., Taking sennosides-docusate (SENOKOT-S) 8.6-50 mg tablet, Take 1 tablet by mouth 2 (two) times daily For constipation, Taking lactose-reduced food (ENSURE PLUS ORAL), Take 237 mLs by mouth 2 (two) times daily With breakfast and dinner  HPI   CLINICAL SUMMARY:  Patient post right AKA and working with prosthesis. 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 Able to do housework, improving  PAIN:  Are you having pain? Yes: NPRS scale: 5/10 Pain location: L knee Pain description: aching Aggravating factors: walking Relieving factors: rest  PRECAUTIONS: None  WEIGHT BEARING RESTRICTIONS No  FALLS:  Has patient fallen in last 6 months? No  LIVING ENVIRONMENT:   Lives with: lives with their spouse Lives in: House/apartment Stairs: No Has following equipment at home: Gilford Rile - 4 wheeled and Wheelchair (manual)  OCCUPATION: retired  PLOF: Requires assistive device for independence  PATIENT GOALS  improve standing tolerance/ pull up  pants/ ambulate with [place] safely.     OBJECTIVE:   PATIENT SURVEYS:  FOTO initial 46/ goal 42.   11/2: 50  COGNITION:  Overall cognitive status: Within functional limits for tasks assessed     SENSATION: WFL  POSTURE: rounded shoulders, forward head, flexed trunk , and weight shift left  PALPATION: No tenderness along R distal residual limb.  Discussed phantom limb sensation.    LOWER EXTREMITY ROM:  B LE AROM WFL except L knee extension secondary to articulating spacer.  Did not assess L/R hip extension at this time.    LOWER EXTREMITY MMT:  MMT Right eval Left eval  Hip flexion 4/5 4/5  Hip extension    Hip abduction 4+/5 4+/5  Hip adduction 4+/5 4+/5  Hip internal rotation    Hip external rotation    Knee flexion N/A 4+/5  Knee extension N/A 4+/5  Ankle dorsiflexion  5/5  Ankle plantarflexion    Ankle inversion    Ankle eversion     (Blank rows = not tested)  GAIT: Distance walked: in gym/ //-bars Assistive device utilized: Environmental consultant - 2 wheeled Level of assistance: CGA Comments: Cuing for posture correction with mirror feedback.  Flexed posture/ heavy UE assist.     TODAY'S TREATMENT:  01/27/22  Subjective:  Pt. Has not been to PT in 3 weeks secondary to pts. Husband Yvone Neu) sick.  Pt. Has not walked since last PT appt. But has been wearing gel liner.  Pt. Did not complete stairs for holiday party due to no assistance.    Objective:    There.ex.:       Nustep L4 10+ min. With B UE/LE.  Use of R toe clip to improve placement of R foot.    Sit to stands from gray chair at //-bars with B UE assist (pushing up/ minimal pulling on //-bars).  Forward/ backwards/ lateral walking in //-bars 2x (moderate fatigue).  Seated rest break.      Gait training:    Ambulate from car to gym with RW and CGA with cuing to correct head position/ upright posture.  No LOB and consistent R hip flexion/ step length.  Good control of R prosthetic leg/ knee mechanism.      Ascend/  descend 4 stairs x1 with step to pattern and SBA/CGA for safety.  Pt. Had an episode of L knee buckling and required max assist from PT to return to upright posture.  Pt. Was unable to correct, even with heavy UE assist on handrails.  Pt. Became frustrated and concerned after knee buckling.    Amb. From gray chair to hallway (64 feet) prior to marked fatigue and pt. Requesting use of w/c to return to car.   Pt. Able to transfer from w/c to car with mod. I and no issues.         PATIENT EDUCATION:  Education details: HEP/ gait and prosthetic training.  Person educated: Patient and Spouse Education method: Explanation, Demonstration, and Verbal cues Education comprehension: verbalized understanding, returned demonstration, and tactile cues required   HOME EXERCISE PROGRAM: Prone position hip stretches/ Standing marching at kitchen counter with w/c behind patient.      ASSESSMENT:  CLINICAL IMPRESSION: Pt. ambulates around PT clinic with use of  prosthetic leg/ RW with consistent recip.  Moderate LE/ generalized muscle fatigue noted today.  Pt. Had first episode of requiring max PT assist to return to stand after L knee buckling and unable to correct with B handrail support.  PT discussed episode with pt. To instruct on correction and reassure pt.   Pt. Encouraged to stand at [employer] and work on balance/ walking at home.  Pt. Will benefit from skilled PT services to improve standing tolerance/ gait independence and ADL.    OBJECTIVE IMPAIRMENTS Abnormal gait, decreased activity tolerance, decreased balance, decreased endurance, decreased mobility, difficulty walking, decreased ROM, decreased strength, decreased safety awareness, impaired flexibility, improper body mechanics, prosthetic dependency , and pain.   ACTIVITY LIMITATIONS carrying, lifting, standing, squatting, stairs, transfers, bed mobility, bathing, toileting, dressing, hygiene/grooming, and locomotion level  PARTICIPATION  LIMITATIONS: cleaning, laundry, driving, shopping, community activity, and yard work  [place] Past/current experiences are also affecting patient's functional outcome.   REHAB POTENTIAL: Good  CLINICAL DECISION MAKING: Evolving/moderate complexity  EVALUATION COMPLEXITY: High   GOALS: Goals reviewed with patient? Yes  SHORT TERM GOALS: Target date: 10/07/21 Pt. Able to don/doff R prosthetic leg independently to improve ADL/standing tolerance.  Baseline:  min. A to don/ doff prosthetic leg Goal status: Goal met   LONG TERM GOALS: Target date: 01/27/22  Pt. Will increase FOTO to 54 to improve functional mobility.  Baseline: initial FOTO 46.  11/2: 50 Goal status: Partially met  2.  Pt. Able to tolerate standing 10 minutes while pulling up pants with mod. I safely while wearing prosthetic leg to improve daily activities.  Baseline:  limited standing tolerance Goal status: Not met  3.  Pt. Will ambulate 200 feet with use of SPC and mod. I to promote safety with household tasks/ walking into bathroom.   Baseline:  amb. With RW and min. A for safety.  11/2:   Goal status: Not met   PLAN: PT FREQUENCY: 2x/week  PT DURATION: 8 weeks  PLANNED INTERVENTIONS: Therapeutic exercises, Therapeutic activity, Neuromuscular re-education, Balance training, Gait training, Patient/Family education, Self Care, Joint mobilization, Prosthetic training, DME instructions, and Manual therapy  PLAN FOR NEXT SESSION:  Progress gait/ step ups.  RECERT/ CHECK SCHEDULE NEXT TX  Pura Spice, [place], DPT # [phone] 01/28/2022, 11:23 AM

## 2022-02-01 ENCOUNTER — Ambulatory Visit: Payer: Medicare Other | Attending: Internal Medicine | Admitting: Physical Therapy

## 2022-02-01 DIAGNOSIS — R293 Abnormal posture: Secondary | ICD-10-CM | POA: Insufficient documentation

## 2022-02-01 DIAGNOSIS — Z89611 Acquired absence of right leg above knee: Secondary | ICD-10-CM | POA: Diagnosis present

## 2022-02-01 DIAGNOSIS — M6281 Muscle weakness (generalized): Secondary | ICD-10-CM | POA: Diagnosis present

## 2022-02-01 DIAGNOSIS — R269 Unspecified abnormalities of gait and mobility: Secondary | ICD-10-CM | POA: Insufficient documentation

## 2022-02-03 ENCOUNTER — Ambulatory Visit: Payer: Medicare Other | Admitting: Physical Therapy

## 2022-02-03 DIAGNOSIS — M6281 Muscle weakness (generalized): Secondary | ICD-10-CM

## 2022-02-03 DIAGNOSIS — Z89611 Acquired absence of right leg above knee: Secondary | ICD-10-CM | POA: Diagnosis not present

## 2022-02-03 DIAGNOSIS — R269 Unspecified abnormalities of gait and mobility: Secondary | ICD-10-CM

## 2022-02-03 DIAGNOSIS — R293 Abnormal posture: Secondary | ICD-10-CM

## 2022-02-08 ENCOUNTER — Ambulatory Visit: Payer: Medicare Other | Admitting: Physical Therapy

## 2022-02-10 ENCOUNTER — Ambulatory Visit: Payer: Medicare Other | Admitting: Physical Therapy

## 2022-02-10 DIAGNOSIS — Z89611 Acquired absence of right leg above knee: Secondary | ICD-10-CM

## 2022-02-10 DIAGNOSIS — M6281 Muscle weakness (generalized): Secondary | ICD-10-CM

## 2022-02-10 DIAGNOSIS — R269 Unspecified abnormalities of gait and mobility: Secondary | ICD-10-CM

## 2022-02-10 DIAGNOSIS — R293 Abnormal posture: Secondary | ICD-10-CM

## 2022-02-12 ENCOUNTER — Encounter: Payer: Self-pay | Admitting: Physical Therapy

## 2022-02-12 NOTE — Therapy (Signed)
OUTPATIENT PHYSICAL THERAPY LOWER EXTREMITY TREATMENT  Patient Name: Jasmine Morrow MRN: [id number] DOB:March 18, 1945, 77 y.o., female Today's Date: 02/10/2022   PT End of Session - 02/12/22 2127     Visit Number 31    Number of Visits 45    Date for PT Re-Evaluation 03/29/22    PT Start Time 9563    PT Stop Time 8756    PT Time Calculation (min) 65 min    Activity Tolerance Patient tolerated treatment well             Past Medical History:  Diagnosis Date   Anemia    Anxiety    Asthma    seasonal    GERD (gastroesophageal reflux disease)    Hypertension    PONV (postoperative nausea and vomiting)    Seasonal allergies    Sleep apnea    does not uses CPAP machine anymore    Past Surgical History:  Procedure Laterality Date   APPENDECTOMY     CESAREAN SECTION     CHOLECYSTECTOMY     DILATION AND CURETTAGE OF UTERUS     ESOPHAGOGASTRODUODENOSCOPY N/A 02/22/2019   Procedure: ESOPHAGOGASTRODUODENOSCOPY (EGD);  Surgeon: Toledo, Benay Pike, MD;  Location: [facility];  Service: Gastroenterology;  Laterality: N/A;   FLEXIBLE SIGMOIDOSCOPY N/A 02/22/2019   Procedure: FLEXIBLE SIGMOIDOSCOPY;  Surgeon: Toledo, Benay Pike, MD;  Location: [facility];  Service: Gastroenterology;  Laterality: N/A;   HARDWARE REMOVAL Right 11/12/2019   Procedure: Right ankle hardware removal;  Surgeon: Hessie Knows, MD;  Location: [facility];  Service: Orthopedics;  Laterality: Right;   HARDWARE REMOVAL Right 04/17/2020   Procedure: HARDWARE REMOVAL FROM RIGHT ANKLE AND RIGHT KNEE; IMPLANT OF CEMENT SPACERS IN RIGHT KNEE;  Surgeon: Dereck Leep, MD;  Location: [facility];  Service: Orthopedics;  Laterality: Right;   JOINT REPLACEMENT     ORIF ANKLE FRACTURE Right 03/14/2019   Procedure: OPEN REDUCTION INTERNAL FIXATION (ORIF) ANKLE FRACTURE, MEDIAL MALLEOLUS;  Surgeon: Hessie Knows, MD;  Location: [facility];  Service: Orthopedics;  Laterality: Right;   ORIF ANKLE FRACTURE Right 02/08/2019    Procedure: OPEN REDUCTION INTERNAL FIXATION (ORIF) ANKLE FRACTURE, post malleolus;  Surgeon: Hessie Knows, MD;  Location: [facility];  Service: Orthopedics;  Laterality: Right;   REPLACEMENT TOTAL KNEE Bilateral 2008   2009   SCAR DEBRIDEMENT OF TOTAL KNEE  01/2020   SYNDESMOSIS REPAIR Right 02/08/2019   Procedure: SYNDESMOSIS REPAIR;  Surgeon: Hessie Knows, MD;  Location: [facility];  Service: Orthopedics;  Laterality: Right;   TEE WITHOUT CARDIOVERSION N/A 02/26/2019   Procedure: TRANSESOPHAGEAL ECHOCARDIOGRAM (TEE);  Surgeon: Corey Skains, MD;  Location: [facility];  Service: Cardiovascular;  Laterality: N/A;   TOTAL KNEE REVISION WITH SCAR DEBRIDEMENT/PATELLA REVISION WITH POLY EXCHANGE Right 01/24/2020   Procedure: TOTAL KNEE REVISION WITH SCAR DEBRIDEMENT/PATELLA REVISION WITH POLY EXCHANGE;  Surgeon: Dereck Leep, MD;  Location: [facility];  Service: Orthopedics;  Laterality: Right;   TUMOR REMOVAL     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   Patient Active Problem List   Diagnosis Date Noted   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[place]) 04/17/2020   Anxiety 04/17/2020   History of GI diverticular bleed 04/17/2020   Chest pain 04/17/2020   PAD (peripheral artery disease) ([place]) 04/05/2020   Ulcer of right leg ([place]) 04/05/2020   COPD (chronic obstructive pulmonary disease) ([place]) 04/05/2020   GERD (gastroesophageal reflux disease) 04/05/2020   Abscess of right knee 01/22/2020   Pressure injury of skin 02/28/2019  SOB (shortness of breath)    Gastrointestinal hemorrhage    Sepsis (HCC)    Symptomatic anemia 02/20/2019   Trimalleolar fracture of ankle, closed, right, initial encounter 02/07/2019   Multiple lung nodules 03/31/2014   Extrinsic asthma 03/27/2014   OSA on CPAP 03/27/2014    PCP: Miller, Mark F, MD  REFERRING PROVIDER: Miller, Mark F, MD  REFERRING DIAG: Above knee amputation of right lower extremity  THERAPY DIAG:  S/P AKA (above knee amputation) unilateral, right  (HCC)  Muscle weakness (generalized)  Gait difficulty  Abnormal posture  Rationale for Evaluation and Treatment Rehabilitation  ONSET DATE: 12/02/20  SUBJECTIVE:  EVALUATION  PERTINENT HISTORY: Patient Profile:   Jasmine Morrow is a 76 y.o. female Chief Complaint  Patient presents with  Visit Follow Up  Doing well.   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 Cardiac cath 10/2011 showed completely normal coronary arteries, however she did have mild left ventricular dysfunction with anterior wall motion abnormality. Left ventricular EF was 51%.  History of chickenpox  Hyperlipidemia  patient states she has never been diagnosed with this  Hypertension  Knee joint replacement by other means  Macular degeneration  Major depressive disorder, recurrent, mild (CMS-HCC)  Medicare annual wellness visit, initial 08/09/2019  7/21  Multiple thyroid nodules  Normal coronary arteries 09/06/2013  Normal coronary anatomy by cardiac catheterization 10/12/11  Osteoarthritis  Ovarian cyst  PONV (postoperative nausea and vomiting)  nausea  Postoperative urinary retention 12/08/2020  Rash on lips 12/21/2020  Seasonal allergies  Sinusitis, unspecified  Sleep apnea  On CPAP.  Slow transit constipation 12/10/2020  Trimalleolar fracture of ankle, closed, right, initial encounter 02/07/2019  Complicated by  staph infection 1/21, 4 weeks IV Ancef, Menz  Ulcer   Past Surgical History:  Procedure Laterality Date  SALPINGO OOPHORECTOMY Bilateral 1993  Right total knee arthroplasty 10/11/2006  Dr Hooten  Left total knee arthroplasty 10/12/2007  Dr Hooten  COLONOSCOPY 07/30/2012  internal hemorrhoids, diverticulosis  ANKLE ARTHODESIS W/ ARTHROSCOPY Right 02/08/2019  03/14/2019 second surgery  COLONOSCOPY 02/22/2019  Blood in entire colon/Diverticulosis - Presumed diverticular bleed. No repeat recommended per TKT.  EGD 02/22/2019  Gastritis/gastric ulcers/Hiatal hernia/Otherwise normal - no repeat recommended per TKT.  ORIF ANKLE FRACTURE Right 03/14/2019  Dr. Menz  REMOVAL HARDWARE ANKLE FOOT/TOES Right 11/12/2019  Dr. Menz  Right knee arthrotomy, irrigation and debridement of the right knee, polyethylene exchange 01/24/2020  Dr Hooten  Removal of hardware (plates and screws) from the right ankle with irrigation, debridement, and placement of Stimulan antibiotic beads 04/17/2020  Dr Hooten  Right knee arthrotomy, extensive irrigation debridement, removal of right total knee implants, and placement of antibiotic impregnated polymethylmethacrylate cement spacer 04/17/2020  Dr Hooten  AMPUTATION LEG ABOVE KNEE AKA Right 12/02/2020  Procedure: AMPUTATION, THIGH, THROUGH FEMUR, ANY LEVEL; Surgeon: Chun, Danielle Sunae, MD; Location: [facility]; Service: Orthopedics; Laterality: Right;  TRANSFER ADJACENT TISSUE LEG Right 12/02/2020  Procedure: ADJACENT TISSUE TRANSFER OR REARRANGEMENT, LEG;   DEFECT 10 SQ CM OR LESS; Surgeon: Chun, Danielle Sunae, MD; Location: [facility]; Service: Orthopedics; Laterality: Right;  ASPIRATION/INJECTION MAJOR JOINT/BURSA KNEE Left 12/02/2020  Procedure: ARTHROCENTESIS, ASPIRATION AND/OR INJECTION, MAJOR JOINT OR BURSA, KNEE; WITHOUT ULTRASOUND GUIDANCE; Surgeon: Chun, Danielle Sunae, MD; Location: [facility]; Service: Orthopedics; Laterality: Left;  REMOVAL  KNEE PROSTHESIS W/POSSIBLE PLACEMENT SPACER Left 01/18/2021  Procedure: REMOVAL OF PROSTHESIS, INCLUDING TOTAL KNEE PROSTHESIS, METHYLMETHACRYLATE WITH OR WITHOUT INSERTION OF SPACER, KNEE; Surgeon: Jiranek, William Arthur, MD; Location: [facility]; Service: Orthopedics; Laterality: Left;  INSERTION NON-BIODEGRADABLE DRUG DELIVERY IMPLANT Left 01/18/2021  Procedure: INSERTION, KNEE, bioresorbable, biodegradable, NON-BIODEGRADABLE DRUG DELIVERY IMPLANT; Surgeon: Jiranek, William Arthur, MD; Location: [facility]; Service: Orthopedics; Laterality: Left;  ABOVE KNEE LEG AMPUTATION  12/02/2020  APPENDECTOMY Est 1968  CESAREAN SECTION  CHOLECYSTECTOMY  FRACTURE SURGERY 1/21  HYSTERECTOMY partial  JOINT REPLACEMENT 2008 & 2009  TUBAL LIGATION 3/79   ALLERGIES: Allergies  Allergen Reactions  Singulair [Montelukast] Other (See Comments)  Gland swelling  Sulfa (Sulfonamide Antibiotics) Swelling  Oxycodone Dizziness and Other (See Comments)  Vancomycin Analogues Other (See Comments)  Ringing in ears  Avinza [Morphine] Itching  Darvocet A500 [Propoxyphene N-Acetaminophen] Nausea  Nickel Rash  Ultracet [Tramadol-Acetaminophen] Rash  Vicodin [Hydrocodone-Acetaminophen] Vomiting  Vioxx [Rofecoxib] Other (See Comments)  GI   CURRENT MEDICATIONS: Current Outpatient Medications: acetaminophen (TYLENOL) 500 MG tablet, Take 1,000 mg by mouth 3 (three) times a day Twice daily per patient., PRN Not Currently Taking aspirin 81 MG EC tablet, Take 1 tablet (81 mg total) by mouth at bedtime, Taking calcium carbonate-vitamin D3 (CALTRATE 600+D) 600 mg-10 mcg (400 unit) tablet, Take 0.5 tablets by mouth 2 (two) times daily, Taking cetirizine (ZYRTEC) 10 mg capsule, Take 1 capsule (10 mg total) by mouth once daily, Taking escitalopram oxalate (LEXAPRO) 10 MG tablet, Take 1 tablet (10 mg total) by mouth once daily, Taking estradioL (ESTRACE) 0.01 % (0.1 mg/gram) vaginal cream, Place 2 g vaginally twice a  week, Taking folic acid/multivit,iron,miner (MULTIVIT-IRON-MIN-FOLIC ACID ORAL), Take 1 tablet by mouth once daily, Taking gabapentin (NEURONTIN) 300 MG capsule, Take 1 capsule (300 mg total) by mouth 3 (three) times daily Phantom limb pain, Taking melatonin 3 mg tablet, Take 1 tablet (3 mg total) by mouth at bedtime, Taking multivitamin with iron (COMPLETE MULTIVITAMIN-MINERAL) tablet, Take 1 tablet by mouth, Taking multivitamin with minerals, EYE, (PRESERVISION AREDS 2) soft gel capsule, Take 1 capsule by mouth 2 (two) times daily, Taking nystatin (MYCOSTATIN) 100,000 unit/gram powder, Apply small amount topically twice daily for rash under breasts, PRN Not Currently Taking pantoprazole (PROTONIX) 40 MG DR tablet, Take 1 tablet (40 mg total) by mouth 2 (two) times daily. Please call to schedule an appt. Thanks!, Taking polyethylene glycol (MIRALAX) powder, Take 17 g by mouth once daily Mix in 4-8ounces of fluid prior to taking., Taking sennosides-docusate (SENOKOT-S) 8.6-50 mg tablet, Take 1 tablet by mouth 2 (two) times daily For constipation, Taking lactose-reduced food (ENSURE PLUS ORAL), Take 237 mLs by mouth 2 (two) times daily With breakfast and dinner  HPI   CLINICAL SUMMARY:  Patient post right AKA and working with prosthesis. 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 Able to do housework, improving  PAIN:  Are you having pain? Yes: NPRS scale: 5/10 Pain location: L knee Pain description: aching Aggravating factors: walking Relieving factors: rest  PRECAUTIONS: None  WEIGHT BEARING RESTRICTIONS No  FALLS:  Has patient fallen in last 6 months? No  LIVING ENVIRONMENT:   Lives with: lives with their spouse Lives in: House/apartment Stairs: No Has following equipment at home: Gilford Rile - 4 wheeled and Wheelchair (manual)  OCCUPATION: retired  PLOF: Requires assistive device for independence  PATIENT GOALS  improve standing tolerance/ pull up  pants/ ambulate with [place] safely.     OBJECTIVE:   PATIENT SURVEYS:  FOTO initial 46/ goal 56.   11/2: 50  COGNITION:  Overall cognitive status: Within functional limits for tasks assessed     SENSATION: WFL  POSTURE: rounded shoulders, forward head, flexed trunk , and weight shift left  PALPATION: No tenderness along R distal residual limb.  Discussed phantom limb sensation.    LOWER EXTREMITY ROM:  B LE AROM WFL except L knee extension secondary to articulating spacer.  Did not assess L/R hip extension at this time.    LOWER EXTREMITY MMT:  MMT Right eval Left eval  Hip flexion 4/5 4/5  Hip extension    Hip abduction 4+/5 4+/5  Hip adduction 4+/5 4+/5  Hip internal rotation    Hip external rotation    Knee flexion N/A 4+/5  Knee extension N/A 4+/5  Ankle dorsiflexion  5/5  Ankle plantarflexion    Ankle inversion    Ankle eversion     (Blank rows = not tested)  GAIT: Distance walked: in gym/ //-bars Assistive device utilized: Environmental consultant - 2 wheeled Level of assistance: CGA Comments: Cuing for posture correction with mirror feedback.  Flexed posture/ heavy UE assist.     TODAY'S TREATMENT:  02/10/22  Subjective:  Pt. Reports no new complaints.  Pt. Arrived in w/c and donned prosthetic leg on blue mat table prior to tx. Session.  Pts. Prosthetic leg not fitting as well today as compared to last tx. Session.      Objective:    There.ex.:      Nustep L4 10+ min. With B UE/LE.  Use of R toe clip to improve placement of R foot.   Sit to stands from blue mat table  Seated marching on blue mat table.       Gait training:    Amb. From blue mat table to Nustep/ to stairs/ to //-bars.  PT had to tighten prosthetic leg strap several times during gait to improve fit of leg.  Moderate cuing to correct upright posture and prevent forward lean/ heavy UE assist on RW and //-bars.  Pts. Prosthetic leg not fitting as well resulting in difficulty with knee extension during wt.  Bearing.   No LOB.    Ascend/ descend stairs 2x with step to gait and no knee buckling issues today.  Heavy UE assist and focus on R knee mechanism control and preventing toe clipping on step.     No walking to car to day.  Pt. Required use of w/c due to fit of prosthetic leg today.      PATIENT EDUCATION:  Education details: HEP/ gait and prosthetic training.  Person educated: Patient and Spouse Education method: Explanation, Demonstration, and Verbal cues Education comprehension: verbalized understanding, returned demonstration, and tactile cues required   HOME EXERCISE PROGRAM: Prone position hip stretches/ Standing marching at kitchen counter with w/c behind patient.      ASSESSMENT:  CLINICAL IMPRESSION: Pt. ambulates around PT clinic with use of prosthetic leg/ RW with consistent recip.  Moderate LE/ generalized muscle fatigue noted today.  Good technique/ hip flexion with stairs today.  SBA/CGA from PT for safety, esp. While making initial step ups.  Pt. Encouraged to stand  at [employer] and work on balance/ walking at home.  Pt. Will benefit from skilled PT services to improve standing tolerance/ gait independence and ADL.    OBJECTIVE IMPAIRMENTS Abnormal gait, decreased activity tolerance, decreased balance, decreased endurance, decreased mobility, difficulty walking, decreased ROM, decreased strength, decreased safety awareness, impaired flexibility, improper body mechanics, prosthetic dependency , and pain.   ACTIVITY LIMITATIONS carrying, lifting, standing, squatting, stairs, transfers, bed mobility, bathing, toileting, dressing, hygiene/grooming, and locomotion level  PARTICIPATION LIMITATIONS: cleaning, laundry, driving, shopping, community activity, and yard work  [place] Past/current experiences are also affecting patient's functional outcome.   REHAB POTENTIAL: Good  CLINICAL DECISION MAKING: Evolving/moderate complexity  EVALUATION COMPLEXITY:  High   GOALS: Goals reviewed with patient? Yes  SHORT TERM GOALS: Target date: 10/07/21 Pt. Able to don/doff R prosthetic leg independently to improve ADL/standing tolerance.  Baseline:  min. A to don/ doff prosthetic leg Goal status: Goal met   LONG TERM GOALS: Target date: 03/29/22  Pt. Will increase FOTO to 54 to improve functional mobility.  Baseline: initial FOTO 46.  11/2: 50 Goal status: Partially met  2.  Pt. Able to tolerate standing 10 minutes while pulling up pants with mod. I safely while wearing prosthetic leg to improve daily activities.  Baseline:  limited standing tolerance Goal status: Not met  3.  Pt. Will ambulate 200 feet with use of SPC and mod. I to promote safety with household tasks/ walking into bathroom.   Baseline:  amb. With RW and min. A for safety.  11/2:   Goal status: Not met   PLAN: PT FREQUENCY: 2x/week  PT DURATION: 8 weeks  PLANNED INTERVENTIONS: Therapeutic exercises, Therapeutic activity, Neuromuscular re-education, Balance training, Gait training, Patient/Family education, Self Care, Joint mobilization, Prosthetic training, DME instructions, and Manual therapy  PLAN FOR NEXT SESSION:  Progress gait/ step ups.   Pura Spice, PT, DPT # [phone] 02/12/2022, 11:23 AM

## 2022-02-12 NOTE — Therapy (Signed)
OUTPATIENT PHYSICAL THERAPY LOWER EXTREMITY TREATMENT/ RECERTIFICATION   Patient Name: Jasmine Morrow MRN: [id number] DOB:[birth year], 77 y.o., female Today's Date: 02/01/2022   PT End of Session - 02/12/22 1023     Visit Number 29    Number of Visits 69    Date for PT Re-Evaluation 03/29/22    PT Start Time 1648    PT Stop Time 0539    PT Time Calculation (min) 52 min    Activity Tolerance Patient tolerated treatment well             Past Medical History:  Diagnosis Date   Anemia    Anxiety    Asthma    seasonal    GERD (gastroesophageal reflux disease)    Hypertension    PONV (postoperative nausea and vomiting)    Seasonal allergies    Sleep apnea    does not uses CPAP machine anymore    Past Surgical History:  Procedure Laterality Date   APPENDECTOMY     CESAREAN SECTION     CHOLECYSTECTOMY     DILATION AND CURETTAGE OF UTERUS     ESOPHAGOGASTRODUODENOSCOPY N/A 02/22/2019   Procedure: ESOPHAGOGASTRODUODENOSCOPY (EGD);  Surgeon: Toledo, Benay Pike, MD;  Location: [facility];  Service: Gastroenterology;  Laterality: N/A;   FLEXIBLE SIGMOIDOSCOPY N/A 02/22/2019   Procedure: FLEXIBLE SIGMOIDOSCOPY;  Surgeon: Toledo, Benay Pike, MD;  Location: [facility];  Service: Gastroenterology;  Laterality: N/A;   HARDWARE REMOVAL Right 11/12/2019   Procedure: Right ankle hardware removal;  Surgeon: Hessie Knows, MD;  Location: [facility];  Service: Orthopedics;  Laterality: Right;   HARDWARE REMOVAL Right 04/17/2020   Procedure: HARDWARE REMOVAL FROM RIGHT ANKLE AND RIGHT KNEE; IMPLANT OF CEMENT SPACERS IN RIGHT KNEE;  Surgeon: Dereck Leep, MD;  Location: [facility];  Service: Orthopedics;  Laterality: Right;   JOINT REPLACEMENT     ORIF ANKLE FRACTURE Right 03/14/2019   Procedure: OPEN REDUCTION INTERNAL FIXATION (ORIF) ANKLE FRACTURE, MEDIAL MALLEOLUS;  Surgeon: Hessie Knows, MD;  Location: [facility];  Service: Orthopedics;  Laterality: Right;   ORIF ANKLE FRACTURE Right  02/08/2019   Procedure: OPEN REDUCTION INTERNAL FIXATION (ORIF) ANKLE FRACTURE, post malleolus;  Surgeon: Hessie Knows, MD;  Location: [facility];  Service: Orthopedics;  Laterality: Right;   REPLACEMENT TOTAL KNEE Bilateral 2008   2009   SCAR DEBRIDEMENT OF TOTAL KNEE  01/2020   SYNDESMOSIS REPAIR Right 02/08/2019   Procedure: SYNDESMOSIS REPAIR;  Surgeon: Hessie Knows, MD;  Location: [facility];  Service: Orthopedics;  Laterality: Right;   TEE WITHOUT CARDIOVERSION N/A 02/26/2019   Procedure: TRANSESOPHAGEAL ECHOCARDIOGRAM (TEE);  Surgeon: Corey Skains, MD;  Location: [facility];  Service: Cardiovascular;  Laterality: N/A;   TOTAL KNEE REVISION WITH SCAR DEBRIDEMENT/PATELLA REVISION WITH POLY EXCHANGE Right 01/24/2020   Procedure: TOTAL KNEE REVISION WITH SCAR DEBRIDEMENT/PATELLA REVISION WITH POLY EXCHANGE;  Surgeon: Dereck Leep, MD;  Location: [facility];  Service: Orthopedics;  Laterality: Right;   TUMOR REMOVAL     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   Patient Active Problem List   Diagnosis Date Noted   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[place]) 04/17/2020   Anxiety 04/17/2020   History of GI diverticular bleed 04/17/2020   Chest pain 04/17/2020   PAD (peripheral artery disease) ([place]) 04/05/2020   Ulcer of right leg ([place]) 04/05/2020   COPD (chronic obstructive pulmonary disease) ([place]) 04/05/2020   GERD (gastroesophageal reflux disease) 04/05/2020   Abscess of right knee 01/22/2020   Pressure injury of skin  02/28/2019   SOB (shortness of breath)    Gastrointestinal hemorrhage    Sepsis ([place])    Symptomatic anemia 02/20/2019   Trimalleolar fracture of ankle, closed, right, initial encounter 02/07/2019   Multiple lung nodules 03/31/2014   Extrinsic asthma 03/27/2014   OSA on CPAP 03/27/2014    PCP: Rusty Aus, MD  REFERRING PROVIDER: Rusty Aus, MD  REFERRING DIAG: Above knee amputation of right lower extremity  THERAPY DIAG:  S/P AKA (above knee amputation)  unilateral, right (HCC)  Muscle weakness (generalized)  Gait difficulty  Abnormal posture  Rationale for Evaluation and Treatment Rehabilitation  ONSET DATE: 12/02/20  SUBJECTIVE:  EVALUATION  PERTINENT HISTORY: Patient Profile:   Jasmine Morrow is a 77 y.o. female Chief Complaint  Patient presents with  Visit Follow Up  Doing well.   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 Cardiac cath 10/2011 showed completely normal coronary arteries, however she did have mild left ventricular dysfunction with anterior wall motion abnormality. Left ventricular EF was 51%.  History of chickenpox  Hyperlipidemia  patient states she has never been diagnosed with this  Hypertension  Knee joint replacement by other means  Macular degeneration  Major depressive disorder, recurrent, mild (CMS-HCC)  Medicare annual wellness visit, initial 08/09/2019  7/21  Multiple thyroid nodules  Normal coronary arteries 09/06/2013  Normal coronary anatomy by cardiac catheterization 10/12/11  Osteoarthritis  Ovarian cyst  PONV (postoperative nausea and vomiting)  nausea  Postoperative urinary retention 12/08/2020  Rash on lips 12/21/2020  Seasonal allergies  Sinusitis, unspecified  Sleep apnea  On CPAP.  Slow transit constipation 12/10/2020  Trimalleolar fracture of ankle, closed, right, initial encounter  81/27/5170  Complicated by staph infection 1/21, 4 weeks IV Ancef, Menz  Ulcer   Past Surgical History:  Procedure Laterality Date  SALPINGO OOPHORECTOMY Bilateral 1993  Right total knee arthroplasty 10/11/2006  Dr Marry Guan  Left total knee arthroplasty 10/12/2007  Dr Marry Guan  COLONOSCOPY 07/30/2012  internal hemorrhoids, diverticulosis  ANKLE ARTHODESIS W/ ARTHROSCOPY Right 02/08/2019  03/14/2019 second surgery  COLONOSCOPY 02/22/2019  Blood in entire colon/Diverticulosis - Presumed diverticular bleed. No repeat recommended per TKT.  EGD 02/22/2019  Gastritis/gastric ulcers/Hiatal hernia/Otherwise normal - no repeat recommended per TKT.  ORIF ANKLE FRACTURE Right 03/14/2019  Dr. Rudene Christians  REMOVAL HARDWARE ANKLE FOOT/TOES Right 11/12/2019  Dr. Rudene Christians  Right knee arthrotomy, irrigation and debridement of the right knee, polyethylene exchange 01/24/2020  Dr Marry Guan  Removal of hardware (plates and screws) from the right ankle with irrigation, debridement, and placement of Stimulan antibiotic beads 04/17/2020  Dr Marry Guan  Right knee arthrotomy, extensive irrigation debridement, removal of right total knee implants, and placement of antibiotic impregnated polymethylmethacrylate cement spacer 04/17/2020  Dr Marry Guan  AMPUTATION LEG ABOVE KNEE AKA Right 12/02/2020  Procedure: AMPUTATION, THIGH, THROUGH FEMUR, ANY LEVEL; Surgeon: Lyndle Herrlich, MD; Location: [place]; Service: Orthopedics; Laterality: Right;  TRANSFER ADJACENT TISSUE LEG Right 12/02/2020  Procedure: ADJACENT TISSUE TRANSFER  OR REARRANGEMENT, LEG; DEFECT 10 SQ CM OR LESS; Surgeon: Lyndle Herrlich, MD; Location: [place]; Service: Orthopedics; Laterality: Right;  ASPIRATION/INJECTION MAJOR JOINT/BURSA KNEE Left 12/02/2020  Procedure: ARTHROCENTESIS, ASPIRATION AND/OR INJECTION, MAJOR JOINT OR BURSA, KNEE; WITHOUT ULTRASOUND GUIDANCE; Surgeon: Lyndle Herrlich, MD; Location: [place]; Service: Orthopedics;  Laterality: Left;  REMOVAL KNEE PROSTHESIS W/POSSIBLE PLACEMENT SPACER Left 01/18/2021  Procedure: REMOVAL OF PROSTHESIS, INCLUDING TOTAL KNEE PROSTHESIS, METHYLMETHACRYLATE WITH OR WITHOUT INSERTION OF SPACER, KNEE; Surgeon: Ihor Dow, MD; Location: [place]; Service: Orthopedics; Laterality: Left;  INSERTION NON-BIODEGRADABLE DRUG DELIVERY IMPLANT Left 01/18/2021  Procedure: INSERTION, KNEE, bioresorbable, biodegradable, NON-BIODEGRADABLE DRUG DELIVERY IMPLANT; Surgeon: Ihor Dow, MD; Location: [place]; Service: Orthopedics; Laterality: Left;  ABOVE KNEE LEG AMPUTATION  12/02/2020  APPENDECTOMY Est 1968  CESAREAN SECTION  CHOLECYSTECTOMY  FRACTURE SURGERY 1/21  HYSTERECTOMY partial  JOINT REPLACEMENT 2008 & 2009  TUBAL LIGATION 3/79   ALLERGIES: Allergies  Allergen Reactions  Singulair [Montelukast] Other (See Comments)  Gland swelling  Sulfa (Sulfonamide Antibiotics) Swelling  Oxycodone Dizziness and Other (See Comments)  Vancomycin Analogues Other (See Comments)  Ringing in ears  Avinza [Morphine] Itching  Darvocet A500 [Propoxyphene N-Acetaminophen] Nausea  Nickel Rash  Ultracet [Tramadol-Acetaminophen] Rash  Vicodin [Hydrocodone-Acetaminophen] Vomiting  Vioxx [Rofecoxib] Other (See Comments)  GI   CURRENT MEDICATIONS: Current Outpatient Medications: acetaminophen (TYLENOL) 500 MG tablet, Take 1,000 mg by mouth 3 (three) times a day Twice daily per patient., PRN Not Currently Taking aspirin 81 MG EC tablet, Take 1 tablet (81 mg total) by mouth at bedtime, Taking calcium carbonate-vitamin D3 (CALTRATE 600+D) 600 mg-10 mcg (400 unit) tablet, Take 0.5 tablets by mouth 2 (two) times daily, Taking cetirizine (ZYRTEC) 10 mg capsule, Take 1 capsule (10 mg total) by mouth once daily, Taking escitalopram oxalate (LEXAPRO) 10 MG tablet, Take 1 tablet (10 mg total) by mouth once daily, Taking estradioL (ESTRACE) 0.01 % (0.1 mg/gram) vaginal cream,  Place 2 g vaginally twice a week, Taking folic acid/multivit,iron,miner (MULTIVIT-IRON-MIN-FOLIC ACID ORAL), Take 1 tablet by mouth once daily, Taking gabapentin (NEURONTIN) 300 MG capsule, Take 1 capsule (300 mg total) by mouth 3 (three) times daily Phantom limb pain, Taking melatonin 3 mg tablet, Take 1 tablet (3 mg total) by mouth at bedtime, Taking multivitamin with iron (COMPLETE MULTIVITAMIN-MINERAL) tablet, Take 1 tablet by mouth, Taking multivitamin with minerals, EYE, (PRESERVISION AREDS 2) soft gel capsule, Take 1 capsule by mouth 2 (two) times daily, Taking nystatin (MYCOSTATIN) 100,000 unit/gram powder, Apply small amount topically twice daily for rash under breasts, PRN Not Currently Taking pantoprazole (PROTONIX) 40 MG DR tablet, Take 1 tablet (40 mg total) by mouth 2 (two) times daily. Please call to schedule an appt. Thanks!, Taking polyethylene glycol (MIRALAX) powder, Take 17 g by mouth once daily Mix in 4-8ounces of fluid prior to taking., Taking sennosides-docusate (SENOKOT-S) 8.6-50 mg tablet, Take 1 tablet by mouth 2 (two) times daily For constipation, Taking lactose-reduced food (ENSURE PLUS ORAL), Take 237 mLs by mouth 2 (two) times daily With breakfast and dinner  HPI   CLINICAL SUMMARY:  Patient post right AKA and working with prosthesis. 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 Able to do housework, improving  PAIN:  Are you having pain? Yes: NPRS scale: 5/10 Pain location: L knee Pain description: aching Aggravating factors: walking Relieving factors: rest  PRECAUTIONS: None  WEIGHT BEARING RESTRICTIONS No  FALLS:  Has patient fallen in last 6 months? No  LIVING ENVIRONMENT: Lives with: lives with their spouse Lives in: House/apartment Stairs: No Has following equipment at home: Gilford Rile - 4 wheeled and Wheelchair (manual)  OCCUPATION: retired  PLOF: Requires assistive device for independence  PATIENT GOALS  improve  standing tolerance/ pull up pants/ ambulate with [place] safely.     OBJECTIVE:   PATIENT SURVEYS:  FOTO initial 46/ goal 32.   11/2: 50  COGNITION:  Overall cognitive status: Within functional limits for tasks assessed     SENSATION: WFL  POSTURE: rounded shoulders, forward head, flexed trunk , and weight shift left  PALPATION: No tenderness along R distal residual limb.  Discussed phantom limb sensation.    LOWER EXTREMITY ROM:  B LE AROM WFL except L knee extension secondary to articulating spacer.  Did not assess L/R hip extension at this time.    LOWER EXTREMITY MMT:  MMT Right eval Left eval  Hip flexion 4/5 4/5  Hip extension    Hip abduction 4+/5 4+/5  Hip adduction 4+/5 4+/5  Hip internal rotation    Hip external rotation    Knee flexion N/A 4+/5  Knee extension N/A 4+/5  Ankle dorsiflexion  5/5  Ankle plantarflexion    Ankle inversion    Ankle eversion     (Blank rows = not tested)  GAIT: Distance walked: in gym/ //-bars Assistive device utilized: Environmental consultant - 2 wheeled Level of assistance: CGA Comments: Cuing for posture correction with mirror feedback.  Flexed posture/ heavy UE assist.     TODAY'S TREATMENT:  02/01/22  Subjective:  Pt. Reports no new complaints.  Pt. Donned prosthetic leg at car and walked into PT clinic with use of RW/ PT assist.    Objective:    There.ex.:      Nustep L4 10+ min. With B UE/LE.  Use of R toe clip to improve placement of R foot.   Sit to stands from blue mat table/ marching in place.  Reassessed fit of prosthetic leg.      Gait training:    Ambulate from car to gym with RW and CGA with cuing to correct head position/ upright posture.  No LOB and consistent R hip flexion/ step length.  Good control of R prosthetic leg/ knee mechanism.  Fatigue noted.    Amb. From blue mat table to hallway and back (192 feet)- most pt. Has walked during a PT tx. Session.  Requesting use of w/c to return to car at end of tx. Due to  fatigue in LE/ UE.  Pt. Able to transfer from w/c to car with mod. I and no issues.         PATIENT EDUCATION:  Education details: HEP/ gait and prosthetic training.  Person educated: Patient and Spouse Education method: Explanation, Demonstration, and Verbal cues Education comprehension: verbalized understanding, returned demonstration, and tactile cues required   HOME EXERCISE PROGRAM: Prone position hip stretches/ Standing marching at kitchen counter with w/c behind patient.      ASSESSMENT:  CLINICAL IMPRESSION: Pt. ambulates around PT clinic with use of prosthetic leg/ RW with consistent recip.  Moderate LE/ generalized muscle fatigue noted today.  Pt. Ambulates increase distance today during PT tx. Session prior to requiring a seated rest break.   Pt. Encouraged to stand at [employer] and work on balance/ walking at home.  Pt. Will benefit from skilled PT services to improve standing tolerance/ gait independence and ADL.    OBJECTIVE IMPAIRMENTS Abnormal gait, decreased activity tolerance, decreased balance, decreased endurance,  decreased mobility, difficulty walking, decreased ROM, decreased strength, decreased safety awareness, impaired flexibility, improper body mechanics, prosthetic dependency , and pain.   ACTIVITY LIMITATIONS carrying, lifting, standing, squatting, stairs, transfers, bed mobility, bathing, toileting, dressing, hygiene/grooming, and locomotion level  PARTICIPATION LIMITATIONS: cleaning, laundry, driving, shopping, community activity, and yard work  [place] Past/current experiences are also affecting patient's functional outcome.   REHAB POTENTIAL: Good  CLINICAL DECISION MAKING: Evolving/moderate complexity  EVALUATION COMPLEXITY: High   GOALS: Goals reviewed with patient? Yes  SHORT TERM GOALS: Target date: 10/07/21 Pt. Able to don/doff R prosthetic leg independently to improve ADL/standing tolerance.  Baseline:  min. A to don/ doff  prosthetic leg Goal status: Goal met   LONG TERM GOALS: Target date: 03/29/22  Pt. Will increase FOTO to 54 to improve functional mobility.  Baseline: initial FOTO 46.  11/2: 50 Goal status: Partially met  2.  Pt. Able to tolerate standing 10 minutes while pulling up pants with mod. I safely while wearing prosthetic leg to improve daily activities.  Baseline:  limited standing tolerance Goal status: Not met  3.  Pt. Will ambulate 200 feet with use of SPC and mod. I to promote safety with household tasks/ walking into bathroom.   Baseline:  amb. With RW and min. A for safety.  11/2:   Goal status: Not met   PLAN: PT FREQUENCY: 2x/week  PT DURATION: 8 weeks  PLANNED INTERVENTIONS: Therapeutic exercises, Therapeutic activity, Neuromuscular re-education, Balance training, Gait training, Patient/Family education, Self Care, Joint mobilization, Prosthetic training, DME instructions, and Manual therapy  PLAN FOR NEXT SESSION:  Progress gait/ step ups.   Pura Spice, PT, DPT # [phone] 01/28/2022, 11:23 AM

## 2022-02-12 NOTE — Therapy (Signed)
OUTPATIENT PHYSICAL THERAPY LOWER EXTREMITY TREATMENT Physical Therapy Progress Note   Dates of reporting period  12/07/21  to  02/03/22   Patient Name: Jasmine Morrow MRN: [id number] DOB:12-03-1945, 77 y.o., female Today's Date: 02/03/2022   PT End of Session - 02/12/22 1112     Visit Number 30    Number of Visits 55    Date for PT Re-Evaluation 03/29/22    PT Start Time 1638    PT Stop Time 1736    PT Time Calculation (min) 58 min    Activity Tolerance Patient tolerated treatment well             Past Medical History:  Diagnosis Date   Anemia    Anxiety    Asthma    seasonal    GERD (gastroesophageal reflux disease)    Hypertension    PONV (postoperative nausea and vomiting)    Seasonal allergies    Sleep apnea    does not uses CPAP machine anymore    Past Surgical History:  Procedure Laterality Date   APPENDECTOMY     CESAREAN SECTION     CHOLECYSTECTOMY     DILATION AND CURETTAGE OF UTERUS     ESOPHAGOGASTRODUODENOSCOPY N/A 02/22/2019   Procedure: ESOPHAGOGASTRODUODENOSCOPY (EGD);  Surgeon: Toledo, Benay Pike, MD;  Location: [facility];  Service: Gastroenterology;  Laterality: N/A;   FLEXIBLE SIGMOIDOSCOPY N/A 02/22/2019   Procedure: FLEXIBLE SIGMOIDOSCOPY;  Surgeon: Toledo, Benay Pike, MD;  Location: [facility];  Service: Gastroenterology;  Laterality: N/A;   HARDWARE REMOVAL Right 11/12/2019   Procedure: Right ankle hardware removal;  Surgeon: Hessie Knows, MD;  Location: [facility];  Service: Orthopedics;  Laterality: Right;   HARDWARE REMOVAL Right 04/17/2020   Procedure: HARDWARE REMOVAL FROM RIGHT ANKLE AND RIGHT KNEE; IMPLANT OF CEMENT SPACERS IN RIGHT KNEE;  Surgeon: Dereck Leep, MD;  Location: [facility];  Service: Orthopedics;  Laterality: Right;   JOINT REPLACEMENT     ORIF ANKLE FRACTURE Right 03/14/2019   Procedure: OPEN REDUCTION INTERNAL FIXATION (ORIF) ANKLE FRACTURE, MEDIAL MALLEOLUS;  Surgeon: Hessie Knows, MD;  Location: [facility];   Service: Orthopedics;  Laterality: Right;   ORIF ANKLE FRACTURE Right 02/08/2019   Procedure: OPEN REDUCTION INTERNAL FIXATION (ORIF) ANKLE FRACTURE, post malleolus;  Surgeon: Hessie Knows, MD;  Location: [facility];  Service: Orthopedics;  Laterality: Right;   REPLACEMENT TOTAL KNEE Bilateral 2008   2009   SCAR DEBRIDEMENT OF TOTAL KNEE  01/2020   SYNDESMOSIS REPAIR Right 02/08/2019   Procedure: SYNDESMOSIS REPAIR;  Surgeon: Hessie Knows, MD;  Location: [facility];  Service: Orthopedics;  Laterality: Right;   TEE WITHOUT CARDIOVERSION N/A 02/26/2019   Procedure: TRANSESOPHAGEAL ECHOCARDIOGRAM (TEE);  Surgeon: Corey Skains, MD;  Location: [facility];  Service: Cardiovascular;  Laterality: N/A;   TOTAL KNEE REVISION WITH SCAR DEBRIDEMENT/PATELLA REVISION WITH POLY EXCHANGE Right 01/24/2020   Procedure: TOTAL KNEE REVISION WITH SCAR DEBRIDEMENT/PATELLA REVISION WITH POLY EXCHANGE;  Surgeon: Dereck Leep, MD;  Location: [facility];  Service: Orthopedics;  Laterality: Right;   TUMOR REMOVAL     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   Patient Active Problem List   Diagnosis Date Noted   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[place]) 04/17/2020   Anxiety 04/17/2020   History of GI diverticular bleed 04/17/2020   Chest pain 04/17/2020   PAD (peripheral artery disease) ([place]) 04/05/2020   Ulcer of right leg ([place]) 04/05/2020   COPD (chronic obstructive pulmonary disease) ([place]) 04/05/2020   GERD (gastroesophageal reflux  disease) 04/05/2020   Abscess of right knee 01/22/2020   Pressure injury of skin 02/28/2019   SOB (shortness of breath)    Gastrointestinal hemorrhage    Sepsis ([place])    Symptomatic anemia 02/20/2019   Trimalleolar fracture of ankle, closed, right, initial encounter 02/07/2019   Multiple lung nodules 03/31/2014   Extrinsic asthma 03/27/2014   OSA on CPAP 03/27/2014    PCP: Rusty Aus, MD  REFERRING PROVIDER: Rusty Aus, MD  REFERRING DIAG: Above knee amputation of right  lower extremity  THERAPY DIAG:  S/P AKA (above knee amputation) unilateral, right (HCC)  Muscle weakness (generalized)  Gait difficulty  Abnormal posture  Rationale for Evaluation and Treatment Rehabilitation  ONSET DATE: 12/02/20  SUBJECTIVE:  EVALUATION  PERTINENT HISTORY: Patient Profile:   Jasmine Morrow is a 77 y.o. female Chief Complaint  Patient presents with  Visit Follow Up  Doing well.   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 Cardiac cath 10/2011 showed completely normal coronary arteries, however she did have mild left ventricular dysfunction with anterior wall motion abnormality. Left ventricular EF was 51%.  History of chickenpox  Hyperlipidemia  patient states she has never been diagnosed with this  Hypertension  Knee joint replacement by other means  Macular degeneration  Major depressive disorder, recurrent, mild (CMS-HCC)  Medicare annual wellness visit, initial 08/09/2019  7/21  Multiple thyroid nodules  Normal coronary arteries 09/06/2013  Normal coronary anatomy by cardiac catheterization 10/12/11  Osteoarthritis  Ovarian cyst  PONV (postoperative nausea and vomiting)  nausea  Postoperative urinary retention 12/08/2020  Rash on lips 12/21/2020  Seasonal allergies  Sinusitis, unspecified  Sleep apnea  On CPAP.  Slow transit constipation 12/10/2020   Trimalleolar fracture of ankle, closed, right, initial encounter 80/32/1224  Complicated by staph infection 1/21, 4 weeks IV Ancef, [place]  Ulcer   Past Surgical History:  Procedure Laterality Date  SALPINGO OOPHORECTOMY Bilateral 1993  Right total knee arthroplasty 10/11/2006  Dr Marry Guan  Left total knee arthroplasty 10/12/2007  Dr Marry Guan  COLONOSCOPY 07/30/2012  internal hemorrhoids, diverticulosis  ANKLE ARTHODESIS W/ ARTHROSCOPY Right 02/08/2019  03/14/2019 second surgery  COLONOSCOPY 02/22/2019  Blood in entire colon/Diverticulosis - Presumed diverticular bleed. No repeat recommended per TKT.  EGD 02/22/2019  Gastritis/gastric ulcers/Hiatal hernia/Otherwise normal - no repeat recommended per TKT.  ORIF ANKLE FRACTURE Right 03/14/2019  Dr. Rudene Christians  REMOVAL HARDWARE ANKLE FOOT/TOES Right 11/12/2019  Dr. Rudene Christians  Right knee arthrotomy, irrigation and debridement of the right knee, polyethylene exchange 01/24/2020  Dr Marry Guan  Removal of hardware (plates and screws) from the right ankle with irrigation, debridement, and placement of Stimulan antibiotic beads 04/17/2020  Dr Marry Guan  Right knee arthrotomy, extensive irrigation debridement, removal of right total knee implants, and placement of antibiotic impregnated polymethylmethacrylate cement spacer 04/17/2020  Dr Marry Guan  AMPUTATION LEG ABOVE KNEE AKA Right 12/02/2020  Procedure: AMPUTATION, THIGH, THROUGH FEMUR, ANY LEVEL; Surgeon: Lyndle Herrlich, MD; Location: [place]; Service:  Orthopedics; Laterality: Right;  TRANSFER ADJACENT TISSUE LEG Right 12/02/2020  Procedure: ADJACENT TISSUE TRANSFER OR REARRANGEMENT, LEG; DEFECT 10 SQ CM OR LESS; Surgeon: Lyndle Herrlich, MD; Location: [place]; Service: Orthopedics; Laterality: Right;  ASPIRATION/INJECTION MAJOR JOINT/BURSA KNEE Left 12/02/2020  Procedure: ARTHROCENTESIS, ASPIRATION AND/OR INJECTION, MAJOR JOINT OR BURSA, KNEE; WITHOUT ULTRASOUND GUIDANCE; Surgeon: Lyndle Herrlich, MD; Location: [place]; Service: Orthopedics; Laterality: Left;  REMOVAL KNEE PROSTHESIS W/POSSIBLE PLACEMENT SPACER Left 01/18/2021  Procedure: REMOVAL OF PROSTHESIS, INCLUDING TOTAL KNEE PROSTHESIS, METHYLMETHACRYLATE WITH OR WITHOUT INSERTION OF SPACER, KNEE; Surgeon: Ihor Dow, MD; Location: [facility]; Service: Orthopedics; Laterality: Left;  INSERTION NON-BIODEGRADABLE DRUG DELIVERY IMPLANT Left 01/18/2021  Procedure: INSERTION, KNEE, bioresorbable, biodegradable, NON-BIODEGRADABLE DRUG DELIVERY IMPLANT; Surgeon: Ihor Dow, MD; Location: [place]; Service: Orthopedics; Laterality: Left;  ABOVE KNEE LEG AMPUTATION  12/02/2020  APPENDECTOMY Est 1968  CESAREAN SECTION  CHOLECYSTECTOMY  FRACTURE SURGERY 1/21  HYSTERECTOMY partial  JOINT REPLACEMENT 2008 & 2009  TUBAL LIGATION 3/79   ALLERGIES: Allergies  Allergen Reactions  Singulair [Montelukast] Other (See Comments)  Gland swelling  Sulfa (Sulfonamide Antibiotics) Swelling  Oxycodone Dizziness and Other (See Comments)  Vancomycin Analogues Other (See Comments)  Ringing in ears  Avinza [Morphine] Itching  Darvocet A500 [Propoxyphene N-Acetaminophen] Nausea  Nickel Rash  Ultracet [Tramadol-Acetaminophen] Rash  Vicodin [Hydrocodone-Acetaminophen] Vomiting  Vioxx [Rofecoxib] Other (See Comments)  GI   CURRENT MEDICATIONS: Current Outpatient Medications: acetaminophen (TYLENOL) 500 MG tablet, Take 1,000 mg by mouth 3 (three) times a day Twice daily per patient., PRN Not Currently Taking aspirin 81 MG EC tablet, Take 1 tablet (81 mg total) by mouth at bedtime, Taking calcium carbonate-vitamin D3 (CALTRATE 600+D) 600 mg-10 mcg (400 unit) tablet, Take 0.5 tablets by mouth 2 (two) times daily, Taking cetirizine (ZYRTEC) 10 mg capsule, Take 1 capsule (10 mg total) by mouth once daily, Taking escitalopram oxalate (LEXAPRO) 10 MG tablet, Take 1 tablet (10 mg total) by mouth once  daily, Taking estradioL (ESTRACE) 0.01 % (0.1 mg/gram) vaginal cream, Place 2 g vaginally twice a week, Taking folic acid/multivit,iron,miner (MULTIVIT-IRON-MIN-FOLIC ACID ORAL), Take 1 tablet by mouth once daily, Taking gabapentin (NEURONTIN) 300 MG capsule, Take 1 capsule (300 mg total) by mouth 3 (three) times daily Phantom limb pain, Taking melatonin 3 mg tablet, Take 1 tablet (3 mg total) by mouth at bedtime, Taking multivitamin with iron (COMPLETE MULTIVITAMIN-MINERAL) tablet, Take 1 tablet by mouth, Taking multivitamin with minerals, EYE, (PRESERVISION AREDS 2) soft gel capsule, Take 1 capsule by mouth 2 (two) times daily, Taking nystatin (MYCOSTATIN) 100,000 unit/gram powder, Apply small amount topically twice daily for rash under breasts, PRN Not Currently Taking pantoprazole (PROTONIX) 40 MG DR tablet, Take 1 tablet (40 mg total) by mouth 2 (two) times daily. Please call to schedule an appt. Thanks!, Taking polyethylene glycol (MIRALAX) powder, Take 17 g by mouth once daily Mix in 4-8ounces of fluid prior to taking., Taking sennosides-docusate (SENOKOT-S) 8.6-50 mg tablet, Take 1 tablet by mouth 2 (two) times daily For constipation, Taking lactose-reduced food (ENSURE PLUS ORAL), Take 237 mLs by mouth 2 (two) times daily With breakfast and dinner  HPI   CLINICAL SUMMARY:  Patient post right AKA and working with prosthesis. 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 Able to do housework, improving  PAIN:  Are you having pain? Yes: NPRS scale: 5/10 Pain location: L knee Pain description: aching Aggravating factors: walking Relieving factors: rest  PRECAUTIONS: None  WEIGHT BEARING RESTRICTIONS No  FALLS:  Has patient fallen in last 6 months? No  LIVING ENVIRONMENT: Lives with: lives with their spouse Lives in: House/apartment Stairs: No Has following equipment at home: Gilford Rile - 4 wheeled and Wheelchair (manual)  OCCUPATION: retired  PLOF:  Requires assistive device for independence  PATIENT GOALS  improve standing tolerance/ pull up pants/ ambulate with [place] safely.     OBJECTIVE:   PATIENT SURVEYS:  FOTO initial 46/ goal 30.   11/2: 50  COGNITION:  Overall cognitive status: Within functional limits for tasks assessed     SENSATION: WFL  POSTURE: rounded shoulders, forward head, flexed trunk , and weight shift left  PALPATION: No tenderness along R distal residual limb.  Discussed phantom limb sensation.    LOWER EXTREMITY ROM:  B LE AROM WFL except L knee extension secondary to articulating spacer.  Did not assess L/R hip extension at this time.    LOWER EXTREMITY MMT:  MMT Right eval Left eval  Hip flexion 4/5 4/5  Hip extension    Hip abduction 4+/5 4+/5  Hip adduction 4+/5 4+/5  Hip internal rotation    Hip external rotation    Knee flexion N/A 4+/5  Knee extension N/A 4+/5  Ankle dorsiflexion  5/5  Ankle plantarflexion    Ankle inversion    Ankle eversion     (Blank rows = not tested)  GAIT: Distance walked: in gym/ //-bars Assistive device utilized: Environmental consultant - 2 wheeled Level of assistance: CGA Comments: Cuing for posture correction with mirror feedback.  Flexed posture/ heavy UE assist.     TODAY'S TREATMENT:  02/03/22  Subjective:  Pt. Reports no new complaints.  Pt. Arrived in w/c and donned prosthetic leg on blue mat table prior to tx. Session.     Objective:    There.ex.:      Nustep L4 10+ min. With B UE/LE.  Use of R toe clip to improve placement of R foot.   Sit to stands from blue mat table/ marching in place.    Standing step touches at stairs.  B UE assist/ CGA for safety.      Gait training:    Amb. From blue mat table to Nustep/ to stairs/ to //-bars/ to car.  PT had to tighten prosthetic leg strap several times during gait to improve fit of leg.  Moderate cuing to correct upright posture and prevent forward lean/ heavy UE assist on RW and //-bars.  No LOB  Ascend/  descend stairs 2x with step to gait and no knee buckling issues today.  Heavy UE assist and focus on R knee mechanism control and preventing toe clipping on step.       PATIENT EDUCATION:  Education details: HEP/ gait and prosthetic training.  Person educated: Patient and Spouse Education method: Explanation, Demonstration, and Verbal cues Education comprehension: verbalized understanding, returned demonstration, and tactile cues required   HOME EXERCISE PROGRAM: Prone position hip stretches/ Standing marching at kitchen counter with w/c behind patient.      ASSESSMENT:  CLINICAL IMPRESSION: Pt. ambulates around PT clinic with use of prosthetic leg/ RW with consistent recip.  Moderate LE/ generalized muscle fatigue noted today.  Good technique/ hip flexion with stairs today.  SBA/CGA from PT for safety, esp. While making initial step ups.  Pt. Encouraged to stand at [employer] and work on balance/ walking at home.  Pt. Will benefit from skilled PT services to improve standing tolerance/ gait independence and ADL.  OBJECTIVE IMPAIRMENTS Abnormal gait, decreased activity tolerance, decreased balance, decreased endurance, decreased mobility, difficulty walking, decreased ROM, decreased strength, decreased safety awareness, impaired flexibility, improper body mechanics, prosthetic dependency , and pain.   ACTIVITY LIMITATIONS carrying, lifting, standing, squatting, stairs, transfers, bed mobility, bathing, toileting, dressing, hygiene/grooming, and locomotion level  PARTICIPATION LIMITATIONS: cleaning, laundry, driving, shopping, community activity, and yard work  [place] Past/current experiences are also affecting patient's functional outcome.   REHAB POTENTIAL: Good  CLINICAL DECISION MAKING: Evolving/moderate complexity  EVALUATION COMPLEXITY: High   GOALS: Goals reviewed with patient? Yes  SHORT TERM GOALS: Target date: 10/07/21 Pt. Able to don/doff R prosthetic leg  independently to improve ADL/standing tolerance.  Baseline:  min. A to don/ doff prosthetic leg Goal status: Goal met   LONG TERM GOALS: Target date: 03/29/22  Pt. Will increase FOTO to 54 to improve functional mobility.  Baseline: initial FOTO 46.  11/2: 50 Goal status: Partially met  2.  Pt. Able to tolerate standing 10 minutes while pulling up pants with mod. I safely while wearing prosthetic leg to improve daily activities.  Baseline:  limited standing tolerance Goal status: Not met  3.  Pt. Will ambulate 200 feet with use of SPC and mod. I to promote safety with household tasks/ walking into bathroom.   Baseline:  amb. With RW and min. A for safety.  11/2:   Goal status: Not met   PLAN: PT FREQUENCY: 2x/week  PT DURATION: 8 weeks  PLANNED INTERVENTIONS: Therapeutic exercises, Therapeutic activity, Neuromuscular re-education, Balance training, Gait training, Patient/Family education, Self Care, Joint mobilization, Prosthetic training, DME instructions, and Manual therapy  PLAN FOR NEXT SESSION:  Progress gait/ step ups.   Pura Spice, PT, DPT # [phone] 02/05/2022, 11:23 AM

## 2022-02-15 ENCOUNTER — Ambulatory Visit: Payer: Medicare Other | Admitting: Physical Therapy

## 2022-02-15 DIAGNOSIS — R269 Unspecified abnormalities of gait and mobility: Secondary | ICD-10-CM

## 2022-02-15 DIAGNOSIS — Z89611 Acquired absence of right leg above knee: Secondary | ICD-10-CM | POA: Diagnosis not present

## 2022-02-15 DIAGNOSIS — R293 Abnormal posture: Secondary | ICD-10-CM

## 2022-02-15 DIAGNOSIS — M6281 Muscle weakness (generalized): Secondary | ICD-10-CM

## 2022-02-16 NOTE — Therapy (Signed)
OUTPATIENT PHYSICAL THERAPY LOWER EXTREMITY TREATMENT  Patient Name: Jasmine Morrow MRN: [id number] DOB:1945/08/19, 77 y.o., female Today's Date: 02/15/2022   PT End of Session - 02/16/22 1540     Visit Number 32    Number of Visits 22    Date for PT Re-Evaluation 03/29/22    PT Start Time 6160    PT Stop Time 7371    PT Time Calculation (min) 67 min    Activity Tolerance Patient tolerated treatment well             Past Medical History:  Diagnosis Date   Anemia    Anxiety    Asthma    seasonal    GERD (gastroesophageal reflux disease)    Hypertension    PONV (postoperative nausea and vomiting)    Seasonal allergies    Sleep apnea    does not uses CPAP machine anymore    Past Surgical History:  Procedure Laterality Date   APPENDECTOMY     CESAREAN SECTION     CHOLECYSTECTOMY     DILATION AND CURETTAGE OF UTERUS     ESOPHAGOGASTRODUODENOSCOPY N/A 02/22/2019   Procedure: ESOPHAGOGASTRODUODENOSCOPY (EGD);  Surgeon: Toledo, Benay Pike, MD;  Location: [facility];  Service: Gastroenterology;  Laterality: N/A;   FLEXIBLE SIGMOIDOSCOPY N/A 02/22/2019   Procedure: FLEXIBLE SIGMOIDOSCOPY;  Surgeon: Toledo, Benay Pike, MD;  Location: [facility];  Service: Gastroenterology;  Laterality: N/A;   HARDWARE REMOVAL Right 11/12/2019   Procedure: Right ankle hardware removal;  Surgeon: Hessie Knows, MD;  Location: [facility];  Service: Orthopedics;  Laterality: Right;   HARDWARE REMOVAL Right 04/17/2020   Procedure: HARDWARE REMOVAL FROM RIGHT ANKLE AND RIGHT KNEE; IMPLANT OF CEMENT SPACERS IN RIGHT KNEE;  Surgeon: Dereck Leep, MD;  Location: [facility];  Service: Orthopedics;  Laterality: Right;   JOINT REPLACEMENT     ORIF ANKLE FRACTURE Right 03/14/2019   Procedure: OPEN REDUCTION INTERNAL FIXATION (ORIF) ANKLE FRACTURE, MEDIAL MALLEOLUS;  Surgeon: Hessie Knows, MD;  Location: [facility];  Service: Orthopedics;  Laterality: Right;   ORIF ANKLE FRACTURE Right 02/08/2019    Procedure: OPEN REDUCTION INTERNAL FIXATION (ORIF) ANKLE FRACTURE, post malleolus;  Surgeon: Hessie Knows, MD;  Location: [facility];  Service: Orthopedics;  Laterality: Right;   REPLACEMENT TOTAL KNEE Bilateral 2008   2009   SCAR DEBRIDEMENT OF TOTAL KNEE  01/2020   SYNDESMOSIS REPAIR Right 02/08/2019   Procedure: SYNDESMOSIS REPAIR;  Surgeon: Hessie Knows, MD;  Location: [facility];  Service: Orthopedics;  Laterality: Right;   TEE WITHOUT CARDIOVERSION N/A 02/26/2019   Procedure: TRANSESOPHAGEAL ECHOCARDIOGRAM (TEE);  Surgeon: Corey Skains, MD;  Location: [facility];  Service: Cardiovascular;  Laterality: N/A;   TOTAL KNEE REVISION WITH SCAR DEBRIDEMENT/PATELLA REVISION WITH POLY EXCHANGE Right 01/24/2020   Procedure: TOTAL KNEE REVISION WITH SCAR DEBRIDEMENT/PATELLA REVISION WITH POLY EXCHANGE;  Surgeon: Dereck Leep, MD;  Location: [facility];  Service: Orthopedics;  Laterality: Right;   TUMOR REMOVAL     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   Patient Active Problem List   Diagnosis Date Noted   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[place]) 04/17/2020   Anxiety 04/17/2020   History of GI diverticular bleed 04/17/2020   Chest pain 04/17/2020   PAD (peripheral artery disease) ([place]) 04/05/2020   Ulcer of right leg ([place]) 04/05/2020   COPD (chronic obstructive pulmonary disease) ([place]) 04/05/2020   GERD (gastroesophageal reflux disease) 04/05/2020   Abscess of right knee 01/22/2020   Pressure injury of skin 02/28/2019  SOB (shortness of breath)    Gastrointestinal hemorrhage    Sepsis (HCC)    Symptomatic anemia 02/20/2019   Trimalleolar fracture of ankle, closed, right, initial encounter 02/07/2019   Multiple lung nodules 03/31/2014   Extrinsic asthma 03/27/2014   OSA on CPAP 03/27/2014    PCP: Miller, Mark F, MD  REFERRING PROVIDER: Miller, Mark F, MD  REFERRING DIAG: Above knee amputation of right lower extremity  THERAPY DIAG:  S/P AKA (above knee amputation) unilateral, right  (HCC)  Muscle weakness (generalized)  Gait difficulty  Abnormal posture  Rationale for Evaluation and Treatment Rehabilitation  ONSET DATE: 12/02/20  SUBJECTIVE:  EVALUATION  PERTINENT HISTORY: Patient Profile:   Jasmine Morrow is a 76 y.o. female Chief Complaint  Patient presents with  Visit Follow Up  Doing well.   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 Cardiac cath 10/2011 showed completely normal coronary arteries, however she did have mild left ventricular dysfunction with anterior wall motion abnormality. Left ventricular EF was 51%.  History of chickenpox  Hyperlipidemia  patient states she has never been diagnosed with this  Hypertension  Knee joint replacement by other means  Macular degeneration  Major depressive disorder, recurrent, mild (CMS-HCC)  Medicare annual wellness visit, initial 08/09/2019  7/21  Multiple thyroid nodules  Normal coronary arteries 09/06/2013  Normal coronary anatomy by cardiac catheterization 10/12/11  Osteoarthritis  Ovarian cyst  PONV (postoperative nausea and vomiting)  nausea  Postoperative urinary retention 12/08/2020  Rash on lips 12/21/2020  Seasonal allergies  Sinusitis, unspecified  Sleep apnea  On CPAP.  Slow transit constipation 12/10/2020  Trimalleolar fracture of ankle, closed, right, initial encounter 02/07/2019  Complicated by  staph infection 1/21, 4 weeks IV Ancef, Menz  Ulcer   Past Surgical History:  Procedure Laterality Date  SALPINGO OOPHORECTOMY Bilateral 1993  Right total knee arthroplasty 10/11/2006  Dr Hooten  Left total knee arthroplasty 10/12/2007  Dr Hooten  COLONOSCOPY 07/30/2012  internal hemorrhoids, diverticulosis  ANKLE ARTHODESIS W/ ARTHROSCOPY Right 02/08/2019  03/14/2019 second surgery  COLONOSCOPY 02/22/2019  Blood in entire colon/Diverticulosis - Presumed diverticular bleed. No repeat recommended per TKT.  EGD 02/22/2019  Gastritis/gastric ulcers/Hiatal hernia/Otherwise normal - no repeat recommended per TKT.  ORIF ANKLE FRACTURE Right 03/14/2019  Dr. Menz  REMOVAL HARDWARE ANKLE FOOT/TOES Right 11/12/2019  Dr. Menz  Right knee arthrotomy, irrigation and debridement of the right knee, polyethylene exchange 01/24/2020  Dr Hooten  Removal of hardware (plates and screws) from the right ankle with irrigation, debridement, and placement of Stimulan antibiotic beads 04/17/2020  Dr Hooten  Right knee arthrotomy, extensive irrigation debridement, removal of right total knee implants, and placement of antibiotic impregnated polymethylmethacrylate cement spacer 04/17/2020  Dr Hooten  AMPUTATION LEG ABOVE KNEE AKA Right 12/02/2020  Procedure: AMPUTATION, THIGH, THROUGH FEMUR, ANY LEVEL; Surgeon: Chun, Danielle Sunae, MD; Location: [facility]; Service: Orthopedics; Laterality: Right;  TRANSFER ADJACENT TISSUE LEG Right 12/02/2020  Procedure: ADJACENT TISSUE TRANSFER OR REARRANGEMENT, LEG;   DEFECT 10 SQ CM OR LESS; Surgeon: Chun, Danielle Sunae, MD; Location: [facility]; Service: Orthopedics; Laterality: Right;  ASPIRATION/INJECTION MAJOR JOINT/BURSA KNEE Left 12/02/2020  Procedure: ARTHROCENTESIS, ASPIRATION AND/OR INJECTION, MAJOR JOINT OR BURSA, KNEE; WITHOUT ULTRASOUND GUIDANCE; Surgeon: Chun, Danielle Sunae, MD; Location: [facility]; Service: Orthopedics; Laterality: Left;  REMOVAL  KNEE PROSTHESIS W/POSSIBLE PLACEMENT SPACER Left 01/18/2021  Procedure: REMOVAL OF PROSTHESIS, INCLUDING TOTAL KNEE PROSTHESIS, METHYLMETHACRYLATE WITH OR WITHOUT INSERTION OF SPACER, KNEE; Surgeon: Jiranek, William Arthur, MD; Location: [facility]; Service: Orthopedics; Laterality: Left;  INSERTION NON-BIODEGRADABLE DRUG DELIVERY IMPLANT Left 01/18/2021  Procedure: INSERTION, KNEE, bioresorbable, biodegradable, NON-BIODEGRADABLE DRUG DELIVERY IMPLANT; Surgeon: Jiranek, William Arthur, MD; Location: [facility]; Service: Orthopedics; Laterality: Left;  ABOVE KNEE LEG AMPUTATION  12/02/2020  APPENDECTOMY Est 1968  CESAREAN SECTION  CHOLECYSTECTOMY  FRACTURE SURGERY 1/21  HYSTERECTOMY partial  JOINT REPLACEMENT 2008 & 2009  TUBAL LIGATION 3/79   ALLERGIES: Allergies  Allergen Reactions  Singulair [Montelukast] Other (See Comments)  Gland swelling  Sulfa (Sulfonamide Antibiotics) Swelling  Oxycodone Dizziness and Other (See Comments)  Vancomycin Analogues Other (See Comments)  Ringing in ears  Avinza [Morphine] Itching  Darvocet A500 [Propoxyphene N-Acetaminophen] Nausea  Nickel Rash  Ultracet [Tramadol-Acetaminophen] Rash  Vicodin [Hydrocodone-Acetaminophen] Vomiting  Vioxx [Rofecoxib] Other (See Comments)  GI   CURRENT MEDICATIONS: Current Outpatient Medications: acetaminophen (TYLENOL) 500 MG tablet, Take 1,000 mg by mouth 3 (three) times a day Twice daily per patient., PRN Not Currently Taking aspirin 81 MG EC tablet, Take 1 tablet (81 mg total) by mouth at bedtime, Taking calcium carbonate-vitamin D3 (CALTRATE 600+D) 600 mg-10 mcg (400 unit) tablet, Take 0.5 tablets by mouth 2 (two) times daily, Taking cetirizine (ZYRTEC) 10 mg capsule, Take 1 capsule (10 mg total) by mouth once daily, Taking escitalopram oxalate (LEXAPRO) 10 MG tablet, Take 1 tablet (10 mg total) by mouth once daily, Taking estradioL (ESTRACE) 0.01 % (0.1 mg/gram) vaginal cream, Place 2 g vaginally twice a  week, Taking folic acid/multivit,iron,miner (MULTIVIT-IRON-MIN-FOLIC ACID ORAL), Take 1 tablet by mouth once daily, Taking gabapentin (NEURONTIN) 300 MG capsule, Take 1 capsule (300 mg total) by mouth 3 (three) times daily Phantom limb pain, Taking melatonin 3 mg tablet, Take 1 tablet (3 mg total) by mouth at bedtime, Taking multivitamin with iron (COMPLETE MULTIVITAMIN-MINERAL) tablet, Take 1 tablet by mouth, Taking multivitamin with minerals, EYE, (PRESERVISION AREDS 2) soft gel capsule, Take 1 capsule by mouth 2 (two) times daily, Taking nystatin (MYCOSTATIN) 100,000 unit/gram powder, Apply small amount topically twice daily for rash under breasts, PRN Not Currently Taking pantoprazole (PROTONIX) 40 MG DR tablet, Take 1 tablet (40 mg total) by mouth 2 (two) times daily. Please call to schedule an appt. Thanks!, Taking polyethylene glycol (MIRALAX) powder, Take 17 g by mouth once daily Mix in 4-8ounces of fluid prior to taking., Taking sennosides-docusate (SENOKOT-S) 8.6-50 mg tablet, Take 1 tablet by mouth 2 (two) times daily For constipation, Taking lactose-reduced food (ENSURE PLUS ORAL), Take 237 mLs by mouth 2 (two) times daily With breakfast and dinner  HPI   CLINICAL SUMMARY:  Patient post right AKA and working with prosthesis. 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 Able to do housework, improving  PAIN:  Are you having pain? Yes: NPRS scale: 5/10 Pain location: L knee Pain description: aching Aggravating factors: walking Relieving factors: rest  PRECAUTIONS: None  WEIGHT BEARING RESTRICTIONS No  FALLS:  Has patient fallen in last 6 months? No  LIVING ENVIRONMENT:   Lives with: lives with their spouse Lives in: House/apartment Stairs: No Has following equipment at home: Gilford Rile - 4 wheeled and Wheelchair (manual)  OCCUPATION: retired  PLOF: Requires assistive device for independence  PATIENT GOALS  improve standing tolerance/ pull up  pants/ ambulate with [place] safely.     OBJECTIVE:   PATIENT SURVEYS:  FOTO initial 46/ goal 81.   11/2: 50  COGNITION:  Overall cognitive status: Within functional limits for tasks assessed     SENSATION: WFL  POSTURE: rounded shoulders, forward head, flexed trunk , and weight shift left  PALPATION: No tenderness along R distal residual limb.  Discussed phantom limb sensation.    LOWER EXTREMITY ROM:  B LE AROM WFL except L knee extension secondary to articulating spacer.  Did not assess L/R hip extension at this time.    LOWER EXTREMITY MMT:  MMT Right eval Left eval  Hip flexion 4/5 4/5  Hip extension    Hip abduction 4+/5 4+/5  Hip adduction 4+/5 4+/5  Hip internal rotation    Hip external rotation    Knee flexion N/A 4+/5  Knee extension N/A 4+/5  Ankle dorsiflexion  5/5  Ankle plantarflexion    Ankle inversion    Ankle eversion     (Blank rows = not tested)  GAIT: Distance walked: in gym/ //-bars Assistive device utilized: Environmental consultant - 2 wheeled Level of assistance: CGA Comments: Cuing for posture correction with mirror feedback.  Flexed posture/ heavy UE assist.     TODAY'S TREATMENT:  02/15/22  Subjective:  Pt. Reports no new complaints.  Pt. Arrived in w/c and donned prosthetic leg on blue mat table prior to tx. Session.  Extra time to don prosthetic leg.  PT discussed talking with Staci Righter about shrinker to manage swelling between PT appts.  Pt. Has appt. With Staci Righter next week (PT will send email).         Objective:    There.ex.:      Nustep L4 10+ min. With B UE/LE.  Use of R toe clip to improve placement of R foot.  Seated marching on blue mat table.    Sit to stands from varying heights of blue mat with use of RW/ proper technique.  SBA/CGA from PT for safety.  Good R prosthetic leg/ knee mechanism control.       Gait training:    Amb. From blue mat table to Nustep/ to stairs/ to //-bars.  PT had to tighten prosthetic leg strap several times  during gait to improve fit of leg.  Moderate cuing to correct upright posture and prevent forward lean/ heavy UE assist on RW and //-bars.  Pts. Prosthetic leg not fitting as well resulting in difficulty with knee extension during wt. Bearing.   No LOB.    Ascend/ descend stairs 2x with step to gait and no knee buckling issues today.  Heavy UE assist and focus on R knee mechanism control and preventing toe clipping on step.     Attempted walk to car but pt. Fatigued requiring seated break in w/c.  Prosthetic leg removed.      PATIENT EDUCATION:  Education details: HEP/ gait and prosthetic training.  Person educated: Patient and Spouse Education method: Explanation, Demonstration, and Verbal cues Education comprehension: verbalized understanding, returned demonstration, and tactile cues required   HOME EXERCISE PROGRAM: Prone position hip stretches/ Standing marching at kitchen counter with w/c behind patient.      ASSESSMENT:  CLINICAL IMPRESSION: Pt. ambulates around PT  clinic with use of prosthetic leg/ RW with consistent recip. Prosthetic leg still not fitting as well as previous weeks and requires several adjustments in standing posture.   Moderate LE/ generalized muscle fatigue noted today.  Good technique/ hip flexion with stairs today.  SBA/CGA from PT for safety, esp. While making initial step ups.  Pt. Encouraged to stand at [employer] and work on balance/ walking at home with daughter in law assistance.  Pt. Will benefit from skilled PT services to improve standing tolerance/ gait independence and ADL.    OBJECTIVE IMPAIRMENTS Abnormal gait, decreased activity tolerance, decreased balance, decreased endurance, decreased mobility, difficulty walking, decreased ROM, decreased strength, decreased safety awareness, impaired flexibility, improper body mechanics, prosthetic dependency , and pain.   ACTIVITY LIMITATIONS carrying, lifting, standing, squatting, stairs, transfers, bed  mobility, bathing, toileting, dressing, hygiene/grooming, and locomotion level  PARTICIPATION LIMITATIONS: cleaning, laundry, driving, shopping, community activity, and yard work  [place] Past/current experiences are also affecting patient's functional outcome.   REHAB POTENTIAL: Good  CLINICAL DECISION MAKING: Evolving/moderate complexity  EVALUATION COMPLEXITY: High   GOALS: Goals reviewed with patient? Yes  SHORT TERM GOALS: Target date: 10/07/21 Pt. Able to don/doff R prosthetic leg independently to improve ADL/standing tolerance.  Baseline:  min. A to don/ doff prosthetic leg Goal status: Goal met   LONG TERM GOALS: Target date: 03/29/22  Pt. Will increase FOTO to 54 to improve functional mobility.  Baseline: initial FOTO 46.  11/2: 50 Goal status: Partially met  2.  Pt. Able to tolerate standing 10 minutes while pulling up pants with mod. I safely while wearing prosthetic leg to improve daily activities.  Baseline:  limited standing tolerance Goal status: Not met  3.  Pt. Will ambulate 200 feet with use of SPC and mod. I to promote safety with household tasks/ walking into bathroom.   Baseline:  amb. With RW and min. A for safety.  11/2:   Goal status: Not met   PLAN: PT FREQUENCY: 2x/week  PT DURATION: 8 weeks  PLANNED INTERVENTIONS: Therapeutic exercises, Therapeutic activity, Neuromuscular re-education, Balance training, Gait training, Patient/Family education, Self Care, Joint mobilization, Prosthetic training, DME instructions, and Manual therapy  PLAN FOR NEXT SESSION:  Progress gait/ step ups.   Pura Spice, PT, DPT # [phone] 02/16/2022, 11:23 AM

## 2022-02-17 ENCOUNTER — Ambulatory Visit: Payer: Medicare Other | Admitting: Physical Therapy

## 2022-02-17 DIAGNOSIS — R293 Abnormal posture: Secondary | ICD-10-CM

## 2022-02-17 DIAGNOSIS — M6281 Muscle weakness (generalized): Secondary | ICD-10-CM

## 2022-02-17 DIAGNOSIS — Z89611 Acquired absence of right leg above knee: Secondary | ICD-10-CM

## 2022-02-17 DIAGNOSIS — R269 Unspecified abnormalities of gait and mobility: Secondary | ICD-10-CM

## 2022-02-22 ENCOUNTER — Ambulatory Visit: Payer: Medicare Other | Admitting: Physical Therapy

## 2022-02-22 DIAGNOSIS — R269 Unspecified abnormalities of gait and mobility: Secondary | ICD-10-CM

## 2022-02-22 DIAGNOSIS — M6281 Muscle weakness (generalized): Secondary | ICD-10-CM

## 2022-02-22 DIAGNOSIS — Z89611 Acquired absence of right leg above knee: Secondary | ICD-10-CM

## 2022-02-22 DIAGNOSIS — R293 Abnormal posture: Secondary | ICD-10-CM

## 2022-02-23 ENCOUNTER — Encounter: Payer: Self-pay | Admitting: Ophthalmology

## 2022-02-24 ENCOUNTER — Ambulatory Visit: Payer: Medicare Other | Admitting: Physical Therapy

## 2022-02-24 DIAGNOSIS — R269 Unspecified abnormalities of gait and mobility: Secondary | ICD-10-CM

## 2022-02-24 DIAGNOSIS — M6281 Muscle weakness (generalized): Secondary | ICD-10-CM

## 2022-02-24 DIAGNOSIS — Z89611 Acquired absence of right leg above knee: Secondary | ICD-10-CM

## 2022-02-24 DIAGNOSIS — R293 Abnormal posture: Secondary | ICD-10-CM

## 2022-02-24 NOTE — Discharge Instructions (Signed)
   Cataract Surgery, Care After ? ?This sheet gives you information about how to care for yourself after your surgery.  Your ophthalmologist may also give you more specific instructions.  If you have problems or questions, contact your doctor at  [facility], [phone]. ? ?What can I expect after the surgery? ?It is common to have: ?Itching ?Foreign body sensation (feels like a grain of sand in the eye) ?Watery discharge (excess tearing) ?Sensitivity to light and touch ?Bruising in or around the eye ?Mild blurred vision ? ?Follow these instructions at home: ?Do not touch or rub your eyes. ?You may be told to wear a protective shield or sunglasses to protect your eyes. ?Do not put a contact lens in the operative eye unless your doctor approves. ?Keep the lids and face clean and dry. ?Do not allow water to hit you directly in the face while showering. ?Keep soap and shampoo out of your eyes. ?Do not use eye makeup for 1 week. ? ?Check your eye every day for signs of infection.  Watch for: ?Redness, swelling, or pain. ?Fluid, blood or pus. ?Worsening vision. ?Worsening sensitivity to light or touch. ? ?Activity: ?During the first day, avoid bending over and reading.  You may resume reading and bending the next day. ?Do not drive or use heavy machinery for at least 24 hours. ?Avoid strenuous activities for 1 week.  Activities such as walking, treadmill, exercise bike, and climbing stairs are okay. ?Do not lift heavy (>20 pound) objects for 1 week. ?Do not do yardwork, gardening, or dirty housework (mopping, cleaning bathrooms, vacuuming, etc.) for 1 week. ?Do not swim or use a hot tub for 2 weeks. ?Ask your doctor when you can return to work. ? ?General Instructions: ?Take or apply prescription and over-the-counter medicines as directed by your doctor, including eyedrops and ointments. ?Resume medications discontinued prior to surgery, unless told otherwise by your doctor. ?Keep all follow up appointments as  scheduled. ? ?Contact a health care provider if: ?You have increased bruising around your eye. ?You have pain that is not helped with medication. ?You have a fever. ?You have fluid, pus, or blood coming from your eye or incision. ?Your sensitivity to light gets worse. ?You have spots (floaters) of flashing lights in your vision. ?You have nausea or vomiting. ? ?Go to the nearest emergency room or call 911 if: ?You have sudden loss of vision. ?You have severe, worsening eye pain. ? ?

## 2022-02-26 NOTE — Therapy (Signed)
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   Hypertension    PONV (postoperative nausea and vomiting)    Seasonal allergies    Sleep apnea    does not uses CPAP machine anymore    Wheelchair dependent    able to self transfer   Past Surgical History:  Procedure Laterality Date   APPENDECTOMY     CESAREAN SECTION     CHOLECYSTECTOMY     DILATION AND CURETTAGE OF UTERUS     ESOPHAGOGASTRODUODENOSCOPY N/A 02/22/2019   Procedure: ESOPHAGOGASTRODUODENOSCOPY (EGD);  Surgeon: Toledo, Benay Pike, MD;  Location: [facility];  Service: Gastroenterology;  Laterality: N/A;   FLEXIBLE SIGMOIDOSCOPY N/A 02/22/2019   Procedure: FLEXIBLE SIGMOIDOSCOPY;  Surgeon: Toledo, Benay Pike, MD;  Location: [facility];  Service: Gastroenterology;  Laterality: N/A;   HARDWARE REMOVAL Right 11/12/2019   Procedure: Right ankle hardware removal;  Surgeon: Hessie Knows, MD;  Location: [facility];  Service: Orthopedics;  Laterality: Right;   HARDWARE REMOVAL Right 04/17/2020   Procedure: HARDWARE REMOVAL FROM RIGHT ANKLE AND RIGHT KNEE; IMPLANT OF CEMENT SPACERS IN RIGHT KNEE;  Surgeon: Dereck Leep, MD;  Location: [facility];  Service: Orthopedics;  Laterality: Right;   JOINT REPLACEMENT     LEG AMPUTATION ABOVE KNEE Right 12/02/2020   [facility]   ORIF ANKLE FRACTURE Right 03/14/2019   Procedure: OPEN REDUCTION INTERNAL FIXATION (ORIF) ANKLE FRACTURE, MEDIAL MALLEOLUS;  Surgeon: Hessie Knows, MD;   Location: [facility];  Service: Orthopedics;  Laterality: Right;   ORIF ANKLE FRACTURE Right 02/08/2019   Procedure: OPEN REDUCTION INTERNAL FIXATION (ORIF) ANKLE FRACTURE, post malleolus;  Surgeon: Hessie Knows, MD;  Location: [facility];  Service: Orthopedics;  Laterality: Right;   PARTIAL HYSTERECTOMY  1993   REPLACEMENT TOTAL KNEE Bilateral 2008   2009   SCAR DEBRIDEMENT OF TOTAL KNEE  01/2020   SYNDESMOSIS REPAIR Right 02/08/2019   Procedure: SYNDESMOSIS REPAIR;  Surgeon: Hessie Knows, MD;  Location: [facility];  Service: Orthopedics;  Laterality: Right;   TEE WITHOUT CARDIOVERSION N/A 02/26/2019   Procedure: TRANSESOPHAGEAL ECHOCARDIOGRAM (TEE);  Surgeon: Corey Skains, MD;  Location: [facility];  Service: Cardiovascular;  Laterality: N/A;   TOTAL KNEE REVISION WITH SCAR DEBRIDEMENT/PATELLA REVISION WITH POLY EXCHANGE Right 01/24/2020   Procedure: TOTAL KNEE REVISION WITH SCAR DEBRIDEMENT/PATELLA REVISION WITH POLY EXCHANGE;  Surgeon: Dereck Leep, MD;  Location: [facility];  Service: Orthopedics;  Laterality: Right;   TUMOR REMOVAL     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   Patient Active Problem List   Diagnosis Date Noted   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[place]) 04/17/2020   Anxiety 04/17/2020   History of GI diverticular bleed 04/17/2020   Chest pain 04/17/2020   PAD (peripheral artery disease) ([place]) 04/05/2020   Ulcer of right leg ([place]) 04/05/2020   COPD (  chronic obstructive pulmonary disease) ([place]) 04/05/2020   GERD (gastroesophageal reflux disease) 04/05/2020   Abscess of right knee 01/22/2020   Pressure injury of skin 02/28/2019   SOB (shortness of breath)    Gastrointestinal hemorrhage    Sepsis (HCC)    Symptomatic anemia 02/20/2019   Trimalleolar fracture of ankle, closed, right, initial encounter 02/07/2019   Multiple lung nodules 03/31/2014   Extrinsic asthma 03/27/2014   OSA on CPAP 03/27/2014    PCP: Rusty Aus, MD  REFERRING PROVIDER: Rusty Aus,  MD  REFERRING DIAG: Above knee amputation of right lower extremity  THERAPY DIAG:  S/P AKA (above knee amputation) unilateral, right (HCC)  Muscle weakness (generalized)  Gait difficulty  Abnormal posture  Rationale for Evaluation and Treatment Rehabilitation  ONSET DATE: 12/02/20  SUBJECTIVE:  EVALUATION  PERTINENT HISTORY: Patient Profile:   Jasmine Morrow is a 77 y.o. female Chief Complaint  Patient presents with  Visit Follow Up  Doing well.   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 Cardiac cath 10/2011 showed completely normal coronary arteries, however she did have mild left ventricular dysfunction with anterior wall motion abnormality. Left ventricular EF was 51%.  History of chickenpox  Hyperlipidemia  patient states she has never been diagnosed with this  Hypertension  Knee joint replacement by other means  Macular degeneration  Major depressive disorder, recurrent, mild (CMS-HCC)  Medicare annual wellness visit, initial 08/09/2019  7/21  Multiple thyroid nodules  Normal coronary arteries 09/06/2013  Normal coronary anatomy by cardiac catheterization 10/12/11  Osteoarthritis  Ovarian cyst  PONV (postoperative nausea and vomiting)  nausea  Postoperative urinary retention 12/08/2020  Rash on lips 12/21/2020  Seasonal allergies  Sinusitis, unspecified  Sleep apnea  On  CPAP.  Slow transit constipation 12/10/2020  Trimalleolar fracture of ankle, closed, right, initial encounter 66/44/0347  Complicated by staph infection 1/21, 4 weeks IV Ancef, [facility]  Ulcer   Past Surgical History:  Procedure Laterality Date  SALPINGO OOPHORECTOMY Bilateral 1993  Right total knee arthroplasty 10/11/2006  Dr Marry Guan  Left total knee arthroplasty 10/12/2007  Dr Marry Guan  COLONOSCOPY 07/30/2012  internal hemorrhoids, diverticulosis  ANKLE ARTHODESIS W/ ARTHROSCOPY Right 02/08/2019  03/14/2019 second surgery  COLONOSCOPY 02/22/2019  Blood in entire colon/Diverticulosis - Presumed diverticular bleed. No repeat recommended per TKT.  EGD 02/22/2019  Gastritis/gastric ulcers/Hiatal hernia/Otherwise normal - no repeat recommended per TKT.  ORIF ANKLE FRACTURE Right 03/14/2019  Dr. Rudene Christians  REMOVAL HARDWARE ANKLE FOOT/TOES Right 11/12/2019  Dr. Rudene Christians  Right knee arthrotomy, irrigation and debridement of the right knee, polyethylene exchange 01/24/2020  Dr Marry Guan  Removal of hardware (plates and screws) from the right ankle with irrigation, debridement, and placement of Stimulan antibiotic beads 04/17/2020  Dr Marry Guan  Right knee arthrotomy, extensive irrigation debridement, removal of right total knee implants, and placement of antibiotic impregnated polymethylmethacrylate cement spacer 04/17/2020  Dr Marry Guan  AMPUTATION LEG ABOVE KNEE AKA Right 12/02/2020  Procedure: AMPUTATION, THIGH, THROUGH FEMUR, ANY  LEVEL; Surgeon: Lyndle Herrlich, MD; Location: [facility]; Service: Orthopedics; Laterality: Right;  TRANSFER ADJACENT TISSUE LEG Right 12/02/2020  Procedure: ADJACENT TISSUE TRANSFER OR REARRANGEMENT, LEG; DEFECT 10 SQ CM OR LESS; Surgeon: Lyndle Herrlich, MD; Location: [facility]; Service: Orthopedics; Laterality: Right;  ASPIRATION/INJECTION MAJOR JOINT/BURSA KNEE Left 12/02/2020  Procedure: ARTHROCENTESIS, ASPIRATION AND/OR INJECTION, MAJOR JOINT OR BURSA, KNEE;  WITHOUT ULTRASOUND GUIDANCE; Surgeon: Lyndle Herrlich, MD; Location: [facility]; Service: Orthopedics; Laterality: Left;  REMOVAL KNEE PROSTHESIS W/POSSIBLE PLACEMENT SPACER Left 01/18/2021  Procedure: REMOVAL OF PROSTHESIS, INCLUDING TOTAL KNEE PROSTHESIS, METHYLMETHACRYLATE WITH OR WITHOUT INSERTION OF SPACER, KNEE; Surgeon: Ihor Dow, MD; Location: [facility]; Service: Orthopedics; Laterality: Left;  INSERTION NON-BIODEGRADABLE DRUG DELIVERY IMPLANT Left 01/18/2021  Procedure: INSERTION, KNEE, bioresorbable, biodegradable, NON-BIODEGRADABLE DRUG DELIVERY IMPLANT; Surgeon: Ihor Dow, MD; Location: [facility]; Service: Orthopedics; Laterality: Left;  ABOVE KNEE LEG AMPUTATION  12/02/2020  APPENDECTOMY Est 1968  CESAREAN SECTION  CHOLECYSTECTOMY  FRACTURE SURGERY 1/21  HYSTERECTOMY partial  JOINT REPLACEMENT 2008 & 2009  TUBAL LIGATION 3/79   ALLERGIES: Allergies  Allergen Reactions  Singulair [Montelukast] Other (See Comments)  Gland swelling  Sulfa (Sulfonamide Antibiotics) Swelling  Oxycodone Dizziness and Other (See Comments)  Vancomycin Analogues Other (See Comments)  Ringing in ears  Avinza [Morphine] Itching  Darvocet A500 [Propoxyphene N-Acetaminophen] Nausea  Nickel Rash  Ultracet [Tramadol-Acetaminophen] Rash  Vicodin [Hydrocodone-Acetaminophen] Vomiting  Vioxx [Rofecoxib] Other (See Comments)  GI   CURRENT MEDICATIONS: Current Outpatient Medications: acetaminophen (TYLENOL) 500 MG tablet, Take 1,000 mg by mouth 3 (three) times a day Twice daily per patient., PRN Not Currently Taking aspirin 81 MG EC tablet, Take 1 tablet (81 mg total) by mouth at bedtime, Taking calcium carbonate-vitamin D3 (CALTRATE 600+D) 600 mg-10 mcg (400 unit) tablet, Take 0.5 tablets by mouth 2 (two) times daily, Taking cetirizine (ZYRTEC) 10 mg capsule, Take 1 capsule (10 mg total) by mouth once daily, Taking escitalopram oxalate (LEXAPRO) 10 MG tablet,  Take 1 tablet (10 mg total) by mouth once daily, Taking estradioL (ESTRACE) 0.01 % (0.1 mg/gram) vaginal cream, Place 2 g vaginally twice a week, Taking folic acid/multivit,iron,miner (MULTIVIT-IRON-MIN-FOLIC ACID ORAL), Take 1 tablet by mouth once daily, Taking gabapentin (NEURONTIN) 300 MG capsule, Take 1 capsule (300 mg total) by mouth 3 (three) times daily Phantom limb pain, Taking melatonin 3 mg tablet, Take 1 tablet (3 mg total) by mouth at bedtime, Taking multivitamin with iron (COMPLETE MULTIVITAMIN-MINERAL) tablet, Take 1 tablet by mouth, Taking multivitamin with minerals, EYE, (PRESERVISION AREDS 2) soft gel capsule, Take 1 capsule by mouth 2 (two) times daily, Taking nystatin (MYCOSTATIN) 100,000 unit/gram powder, Apply small amount topically twice daily for rash under breasts, PRN Not Currently Taking pantoprazole (PROTONIX) 40 MG DR tablet, Take 1 tablet (40 mg total) by mouth 2 (two) times daily. Please call to schedule an appt. Thanks!, Taking polyethylene glycol (MIRALAX) powder, Take 17 g by mouth once daily Mix in 4-8ounces of fluid prior to taking., Taking sennosides-docusate (SENOKOT-S) 8.6-50 mg tablet, Take 1 tablet by mouth 2 (two) times daily For constipation, Taking lactose-reduced food (ENSURE PLUS ORAL), Take 237 mLs by mouth 2 (two) times daily With breakfast and dinner  HPI   CLINICAL SUMMARY:  Patient post right AKA and working with prosthesis. 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 Able to do housework, improving  PAIN:  Are you having pain? Yes: NPRS scale: 5/10 Pain location: L knee Pain description:  aching Aggravating factors: walking Relieving factors: rest  PRECAUTIONS: None  WEIGHT BEARING RESTRICTIONS No  FALLS:  Has patient fallen in last 6 months? No  LIVING ENVIRONMENT: Lives with: lives with their spouse Lives in: House/apartment Stairs: No Has following equipment at home: Gilford Rile - 4 wheeled and Wheelchair  (manual)  OCCUPATION: retired  PLOF: Requires assistive device for independence  PATIENT GOALS  improve standing tolerance/ pull up pants/ ambulate with [place] safely.     OBJECTIVE:   PATIENT SURVEYS:  FOTO initial 46/ goal 9.   11/2: 50  COGNITION:  Overall cognitive status: Within functional limits for tasks assessed     SENSATION: WFL  POSTURE: rounded shoulders, forward head, flexed trunk , and weight shift left  PALPATION: No tenderness along R distal residual limb.  Discussed phantom limb sensation.    LOWER EXTREMITY ROM:  B LE AROM WFL except L knee extension secondary to articulating spacer.  Did not assess L/R hip extension at this time.    LOWER EXTREMITY MMT:  MMT Right eval Left eval  Hip flexion 4/5 4/5  Hip extension    Hip abduction 4+/5 4+/5  Hip adduction 4+/5 4+/5  Hip internal rotation    Hip external rotation    Knee flexion N/A 4+/5  Knee extension N/A 4+/5  Ankle dorsiflexion  5/5  Ankle plantarflexion    Ankle inversion    Ankle eversion     (Blank rows = not tested)  GAIT: Distance walked: in gym/ //-bars Assistive device utilized: Environmental consultant - 2 wheeled Level of assistance: CGA Comments: Cuing for posture correction with mirror feedback.  Flexed posture/ heavy UE assist.     TODAY'S TREATMENT:  02/24/22  Subjective:  Pt. Reports no new complaints.  Pt. Was issued a shrinker for residual limb to manage swelling. Pts. Prosthetic leg fit better today and pt. Only required 2 adjustments in standing posture. No c/o pain reported.    Objective:    There.ex.:      Sit to stands from w/c to elevated mat table (32") to simulate transfer to surgical chair for upcoming cataract surgery.  Pt. Unable to safely transfer to elevated table with or without prosthetic leg.     Nustep L4 10+ min. With B UE/LE.  Use of R toe clip to improve placement of R foot.   Standing wt. Shifting in //-bars with no UE assist/ upright posture correction with  shoulder width BOS.    Forward/ backwards stepping in //-bars with cuing to correct posture.  Heavy UE assist on //-bars required for safety.         Gait training:    Amb. In //-bars and gym with use of RW and focus on recip. Step pattern/ maintaining proper BOS.  Pt. Maintained consistent  step pattern/ heel strike and good knee mechanism control.  No loss of balance or episodes of knee buckling.     Ascend/ descend stairs 2x with step to gait and no knee buckling issues today.  Heavy UE assist and focus on R knee mechanism control and preventing toe clipping on step.      PATIENT EDUCATION:  Education details: HEP/ gait and prosthetic training.  Person educated: Patient and Spouse Education method: Explanation, Demonstration, and Verbal cues Education comprehension: verbalized understanding, returned demonstration, and tactile cues required   HOME EXERCISE PROGRAM: Prone position hip stretches/ Standing marching at kitchen counter with w/c behind patient.      ASSESSMENT:  CLINICAL IMPRESSION: Pt. ambulates around PT  clinic with use of prosthetic leg/ RW with consistent recip. With no LOB.  Pt. Unable to safely transfer from w/c to elevated mat table.   Moderate LE/ generalized muscle fatigue noted today with sit to stands.   Pt. Encouraged to walk more at home with daughter in law/ granddaughter assistance for safety.  Pt. Will benefit from skilled PT services to improve standing tolerance/ gait independence and ADL.    OBJECTIVE IMPAIRMENTS Abnormal gait, decreased activity tolerance, decreased balance, decreased endurance, decreased mobility, difficulty walking, decreased ROM, decreased strength, decreased safety awareness, impaired flexibility, improper body mechanics, prosthetic dependency , and pain.   ACTIVITY LIMITATIONS carrying, lifting, standing, squatting, stairs, transfers, bed mobility, bathing, toileting, dressing, hygiene/grooming, and locomotion level  PARTICIPATION  LIMITATIONS: cleaning, laundry, driving, shopping, community activity, and yard work  [place] Past/current experiences are also affecting patient's functional outcome.   REHAB POTENTIAL: Good  CLINICAL DECISION MAKING: Evolving/moderate complexity  EVALUATION COMPLEXITY: High   GOALS: Goals reviewed with patient? Yes  SHORT TERM GOALS: Target date: 10/07/21 Pt. Able to don/doff R prosthetic leg independently to improve ADL/standing tolerance.  Baseline:  min. A to don/ doff prosthetic leg Goal status: Goal met   LONG TERM GOALS: Target date: 03/29/22  Pt. Will increase FOTO to 54 to improve functional mobility.  Baseline: initial FOTO 46.  11/2: 50 Goal status: Partially met  2.  Pt. Able to tolerate standing 10 minutes while pulling up pants with mod. I safely while wearing prosthetic leg to improve daily activities.  Baseline:  limited standing tolerance Goal status: Not met  3.  Pt. Will ambulate 200 feet with use of SPC and mod. I to promote safety with household tasks/ walking into bathroom.   Baseline:  amb. With RW and min. A for safety.  11/2:   Goal status: Not met   PLAN: PT FREQUENCY: 2x/week  PT DURATION: 8 weeks  PLANNED INTERVENTIONS: Therapeutic exercises, Therapeutic activity, Neuromuscular re-education, Balance training, Gait training, Patient/Family education, Self Care, Joint mobilization, Prosthetic training, DME instructions, and Manual therapy  PLAN FOR NEXT SESSION:  Progress gait/ discuss pts. Cataract surgery and ability to get on surgical chair.   Pura Spice, PT, DPT # [phone] 02/25/2022, 11:29 AM

## 2022-02-26 NOTE — Therapy (Signed)
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   Hypertension    PONV (postoperative nausea and vomiting)    Seasonal allergies    Sleep apnea    does not uses CPAP machine anymore    Wheelchair dependent    able to self transfer   Past Surgical History:  Procedure Laterality Date   APPENDECTOMY     CESAREAN SECTION     CHOLECYSTECTOMY     DILATION AND CURETTAGE OF UTERUS     ESOPHAGOGASTRODUODENOSCOPY N/A 02/22/2019   Procedure: ESOPHAGOGASTRODUODENOSCOPY (EGD);  Surgeon: Toledo, Benay Pike, MD;  Location: [facility];  Service: Gastroenterology;  Laterality: N/A;   FLEXIBLE SIGMOIDOSCOPY N/A 02/22/2019   Procedure: FLEXIBLE SIGMOIDOSCOPY;  Surgeon: Toledo, Benay Pike, MD;  Location: [facility];  Service: Gastroenterology;  Laterality: N/A;   HARDWARE REMOVAL Right 11/12/2019   Procedure: Right ankle hardware removal;  Surgeon: Hessie Knows, MD;  Location: [facility];  Service: Orthopedics;  Laterality: Right;   HARDWARE REMOVAL Right 04/17/2020   Procedure: HARDWARE REMOVAL FROM RIGHT ANKLE AND RIGHT KNEE; IMPLANT OF CEMENT SPACERS IN RIGHT KNEE;  Surgeon: Dereck Leep, MD;  Location: [facility];  Service: Orthopedics;  Laterality: Right;   JOINT REPLACEMENT     LEG AMPUTATION ABOVE KNEE Right 12/02/2020   [facility]   ORIF ANKLE FRACTURE Right 03/14/2019   Procedure: OPEN REDUCTION INTERNAL FIXATION (ORIF) ANKLE  FRACTURE, MEDIAL MALLEOLUS;  Surgeon: Hessie Knows, MD;  Location: [facility];  Service: Orthopedics;  Laterality: Right;   ORIF ANKLE FRACTURE Right 02/08/2019   Procedure: OPEN REDUCTION INTERNAL FIXATION (ORIF) ANKLE FRACTURE, post malleolus;  Surgeon: Hessie Knows, MD;  Location: [facility];  Service: Orthopedics;  Laterality: Right;   PARTIAL HYSTERECTOMY  1993   REPLACEMENT TOTAL KNEE Bilateral 2008   2009   SCAR DEBRIDEMENT OF TOTAL KNEE  01/2020   SYNDESMOSIS REPAIR Right 02/08/2019   Procedure: SYNDESMOSIS REPAIR;  Surgeon: Hessie Knows, MD;  Location: [facility];  Service: Orthopedics;  Laterality: Right;   TEE WITHOUT CARDIOVERSION N/A 02/26/2019   Procedure: TRANSESOPHAGEAL ECHOCARDIOGRAM (TEE);  Surgeon: Corey Skains, MD;  Location: [facility];  Service: Cardiovascular;  Laterality: N/A;   TOTAL KNEE REVISION WITH SCAR DEBRIDEMENT/PATELLA REVISION WITH POLY EXCHANGE Right 01/24/2020   Procedure: TOTAL KNEE REVISION WITH SCAR DEBRIDEMENT/PATELLA REVISION WITH POLY EXCHANGE;  Surgeon: Dereck Leep, MD;  Location: [facility];  Service: Orthopedics;  Laterality: Right;   TUMOR REMOVAL     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   Patient Active Problem List   Diagnosis Date Noted   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[place]) 04/17/2020   Anxiety 04/17/2020   History of GI diverticular bleed 04/17/2020   Chest pain 04/17/2020   PAD (peripheral artery disease) ([place]) 04/05/2020  Ulcer of right leg ([place]) 04/05/2020   COPD (chronic obstructive pulmonary disease) ([place]) 04/05/2020   GERD (gastroesophageal reflux disease) 04/05/2020   Abscess of right knee 01/22/2020   Pressure injury of skin 02/28/2019   SOB (shortness of breath)    Gastrointestinal hemorrhage    Sepsis (HCC)    Symptomatic anemia 02/20/2019   Trimalleolar fracture of ankle, closed, right, initial encounter 02/07/2019   Multiple lung nodules 03/31/2014   Extrinsic asthma 03/27/2014   OSA on CPAP 03/27/2014    PCP:  Rusty Aus, MD  REFERRING PROVIDER: Rusty Aus, MD  REFERRING DIAG: Above knee amputation of right lower extremity  THERAPY DIAG:  S/P AKA (above knee amputation) unilateral, right (HCC)  Muscle weakness (generalized)  Gait difficulty  Abnormal posture  Rationale for Evaluation and Treatment Rehabilitation  ONSET DATE: 12/02/20  SUBJECTIVE:  EVALUATION  PERTINENT HISTORY: Patient Profile:   Jasmine Morrow is a 78 y.o. female Chief Complaint  Patient presents with  Visit Follow Up  Doing well.   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 Cardiac cath 10/2011 showed completely normal coronary arteries, however she did have mild left ventricular dysfunction with anterior wall motion abnormality. Left ventricular EF was 51%.  History of chickenpox  Hyperlipidemia  patient states she has never been diagnosed with this  Hypertension  Knee joint replacement by other means  Macular degeneration  Major depressive disorder, recurrent, mild (CMS-HCC)  Medicare annual wellness visit, initial 08/09/2019  7/21  Multiple thyroid nodules  Normal coronary arteries 09/06/2013  Normal coronary anatomy by cardiac catheterization 10/12/11  Osteoarthritis  Ovarian cyst  PONV (postoperative nausea and vomiting)  nausea  Postoperative urinary retention 12/08/2020  Rash on lips 12/21/2020   Seasonal allergies  Sinusitis, unspecified  Sleep apnea  On CPAP.  Slow transit constipation 12/10/2020  Trimalleolar fracture of ankle, closed, right, initial encounter 74/09/1446  Complicated by staph infection 1/21, 4 weeks IV Ancef, [facility]  Ulcer   Past Surgical History:  Procedure Laterality Date  SALPINGO OOPHORECTOMY Bilateral 1993  Right total knee arthroplasty 10/11/2006  Dr Marry Guan  Left total knee arthroplasty 10/12/2007  Dr Marry Guan  COLONOSCOPY 07/30/2012  internal hemorrhoids, diverticulosis  ANKLE ARTHODESIS W/ ARTHROSCOPY Right 02/08/2019  03/14/2019 second surgery  COLONOSCOPY 02/22/2019  Blood in entire colon/Diverticulosis - Presumed diverticular bleed. No repeat recommended per TKT.  EGD 02/22/2019  Gastritis/gastric ulcers/Hiatal hernia/Otherwise normal - no repeat recommended per TKT.  ORIF ANKLE FRACTURE Right 03/14/2019  Dr. Rudene Christians  REMOVAL HARDWARE ANKLE FOOT/TOES Right 11/12/2019  Dr. Rudene Christians  Right knee arthrotomy, irrigation and debridement of the right knee, polyethylene exchange 01/24/2020  Dr Marry Guan  Removal of hardware (plates and screws) from the right ankle with irrigation, debridement, and placement of Stimulan antibiotic beads 04/17/2020  Dr Marry Guan  Right knee arthrotomy, extensive irrigation debridement, removal of right total knee implants, and placement of antibiotic impregnated polymethylmethacrylate cement spacer 04/17/2020  Dr Marry Guan  AMPUTATION LEG ABOVE KNEE AKA  Right 12/02/2020  Procedure: AMPUTATION, THIGH, THROUGH FEMUR, ANY LEVEL; Surgeon: Lyndle Herrlich, MD; Location: [place]; Service: Orthopedics; Laterality: Right;  TRANSFER ADJACENT TISSUE LEG Right 12/02/2020  Procedure: ADJACENT TISSUE TRANSFER OR REARRANGEMENT, LEG; DEFECT 10 SQ CM OR LESS; Surgeon: Lyndle Herrlich, MD; Location: [place]; Service: Orthopedics; Laterality: Right;  ASPIRATION/INJECTION MAJOR JOINT/BURSA KNEE Left 12/02/2020  Procedure:  ARTHROCENTESIS, ASPIRATION AND/OR INJECTION, MAJOR JOINT OR BURSA, KNEE; WITHOUT ULTRASOUND GUIDANCE; Surgeon: Lyndle Herrlich, MD; Location: [place]; Service: Orthopedics; Laterality: Left;  REMOVAL KNEE PROSTHESIS W/POSSIBLE PLACEMENT SPACER Left 01/18/2021  Procedure: REMOVAL OF PROSTHESIS, INCLUDING TOTAL KNEE PROSTHESIS, METHYLMETHACRYLATE WITH OR WITHOUT INSERTION OF SPACER, KNEE; Surgeon: Ihor Dow, MD; Location: [place]; Service: Orthopedics; Laterality: Left;  INSERTION NON-BIODEGRADABLE DRUG DELIVERY IMPLANT Left 01/18/2021  Procedure: INSERTION, KNEE, bioresorbable, biodegradable, NON-BIODEGRADABLE DRUG DELIVERY IMPLANT; Surgeon: Ihor Dow, MD; Location: [place]; Service: Orthopedics; Laterality: Left;  ABOVE KNEE LEG AMPUTATION  12/02/2020  APPENDECTOMY Est 1968  CESAREAN SECTION  CHOLECYSTECTOMY  FRACTURE SURGERY 1/21  HYSTERECTOMY partial  JOINT REPLACEMENT 2008 & 2009  TUBAL LIGATION 3/79   ALLERGIES: Allergies  Allergen Reactions  Singulair [Montelukast] Other (See Comments)  Gland swelling  Sulfa (Sulfonamide Antibiotics) Swelling  Oxycodone Dizziness and Other (See Comments)  Vancomycin Analogues Other (See Comments)  Ringing in ears  Avinza [Morphine] Itching  Darvocet A500 [Propoxyphene N-Acetaminophen] Nausea  Nickel Rash  Ultracet [Tramadol-Acetaminophen] Rash  Vicodin [Hydrocodone-Acetaminophen] Vomiting  Vioxx [Rofecoxib] Other (See Comments)  GI   CURRENT MEDICATIONS: Current Outpatient Medications: acetaminophen (TYLENOL) 500 MG tablet, Take 1,000 mg by mouth 3 (three) times a day Twice daily per patient., PRN Not Currently Taking aspirin 81 MG EC tablet, Take 1 tablet (81 mg total) by mouth at bedtime, Taking calcium carbonate-vitamin D3 (CALTRATE 600+D) 600 mg-10 mcg (400 unit) tablet, Take 0.5 tablets by mouth 2 (two) times daily, Taking cetirizine (ZYRTEC) 10 mg capsule, Take 1 capsule (10 mg total) by  mouth once daily, Taking escitalopram oxalate (LEXAPRO) 10 MG tablet, Take 1 tablet (10 mg total) by mouth once daily, Taking estradioL (ESTRACE) 0.01 % (0.1 mg/gram) vaginal cream, Place 2 g vaginally twice a week, Taking folic acid/multivit,iron,miner (MULTIVIT-IRON-MIN-FOLIC ACID ORAL), Take 1 tablet by mouth once daily, Taking gabapentin (NEURONTIN) 300 MG capsule, Take 1 capsule (300 mg total) by mouth 3 (three) times daily Phantom limb pain, Taking melatonin 3 mg tablet, Take 1 tablet (3 mg total) by mouth at bedtime, Taking multivitamin with iron (COMPLETE MULTIVITAMIN-MINERAL) tablet, Take 1 tablet by mouth, Taking multivitamin with minerals, EYE, (PRESERVISION AREDS 2) soft gel capsule, Take 1 capsule by mouth 2 (two) times daily, Taking nystatin (MYCOSTATIN) 100,000 unit/gram powder, Apply small amount topically twice daily for rash under breasts, PRN Not Currently Taking pantoprazole (PROTONIX) 40 MG DR tablet, Take 1 tablet (40 mg total) by mouth 2 (two) times daily. Please call to schedule an appt. Thanks!, Taking polyethylene glycol (MIRALAX) powder, Take 17 g by mouth once daily Mix in 4-8ounces of fluid prior to taking., Taking sennosides-docusate (SENOKOT-S) 8.6-50 mg tablet, Take 1 tablet by mouth 2 (two) times daily For constipation, Taking lactose-reduced food (ENSURE PLUS ORAL), Take 237 mLs by mouth 2 (two) times daily With breakfast and dinner  HPI   CLINICAL SUMMARY:  Patient post right AKA and working with prosthesis. 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 Able to do housework, improving  PAIN:  Are you having pain? Yes:  NPRS scale: 5/10 Pain location: L knee Pain description: aching Aggravating factors: walking Relieving factors: rest  PRECAUTIONS: None  WEIGHT BEARING RESTRICTIONS No  FALLS:  Has patient fallen in last 6 months? No  LIVING ENVIRONMENT: Lives with: lives with their spouse Lives in: House/apartment Stairs:  No Has following equipment at home: Gilford Rile - 4 wheeled and Wheelchair (manual)  OCCUPATION: retired  PLOF: Requires assistive device for independence  PATIENT GOALS  improve standing tolerance/ pull up pants/ ambulate with [place] safely.     OBJECTIVE:   PATIENT SURVEYS:  FOTO initial 46/ goal 80.   11/2: 50  COGNITION:  Overall cognitive status: Within functional limits for tasks assessed     SENSATION: WFL  POSTURE: rounded shoulders, forward head, flexed trunk , and weight shift left  PALPATION: No tenderness along R distal residual limb.  Discussed phantom limb sensation.    LOWER EXTREMITY ROM:  B LE AROM WFL except L knee extension secondary to articulating spacer.  Did not assess L/R hip extension at this time.    LOWER EXTREMITY MMT:  MMT Right eval Left eval  Hip flexion 4/5 4/5  Hip extension    Hip abduction 4+/5 4+/5  Hip adduction 4+/5 4+/5  Hip internal rotation    Hip external rotation    Knee flexion N/A 4+/5  Knee extension N/A 4+/5  Ankle dorsiflexion  5/5  Ankle plantarflexion    Ankle inversion    Ankle eversion     (Blank rows = not tested)  GAIT: Distance walked: in gym/ //-bars Assistive device utilized: Environmental consultant - 2 wheeled Level of assistance: CGA Comments: Cuing for posture correction with mirror feedback.  Flexed posture/ heavy UE assist.     TODAY'S TREATMENT:  02/22/22  Subjective:  Pt. Reports no new complaints.  Pt. Has f/u with Staci Righter tomorrow.  Pt. Able to don prosthetic leg better today with improved fit/ less overall residual limb swelling.  PT recommends pt. Discuss getting shriker from [employer].    Objective:    There.ex.:      Nustep L4 10+ min. With B UE/LE.  Use of R toe clip to improve placement of R foot.   Standing wt. Shifting in //-bars with no UE assist/ upright posture correction with shoulder width BOS.    Forward/ backwards stepping in //-bars with cuing to correct posture.  Heavy UE assist on  //-bars required for safety.         Gait training:    Amb. In //-bars and gym with use of RW and pt. Demonstrates able to walk >50 feet with mod. I and no PT assist.  Pt. Insisted on walking in gym without any assistance.  Pt. Maintained consistent R hip flexion/ step pattern and good knee mechanism control.  No loss of balance or episodes of knee buckling.     Ascend/ descend stairs 2x with step to gait and no knee buckling issues today.  Heavy UE assist and focus on R knee mechanism control and preventing toe clipping on step.      PATIENT EDUCATION:  Education details: HEP/ gait and prosthetic training.  Person educated: Patient and Spouse Education method: Explanation, Demonstration, and Verbal cues Education comprehension: verbalized understanding, returned demonstration, and tactile cues required   HOME EXERCISE PROGRAM: Prone position hip stretches/ Standing marching at kitchen counter with w/c behind patient.      ASSESSMENT:  CLINICAL IMPRESSION: Pt. ambulates around PT clinic with use of prosthetic leg/ RW with consistent  recip. With increase distances today.  Pt. Demonstrates ability to walking with mod. I and use of RW in gym.   Moderate LE/ generalized muscle fatigue noted today with sit to stands.   Pt. Encouraged to walk more at home with daughter in law/ granddaughter assistance for safety.  Pt. Will benefit from skilled PT services to improve standing tolerance/ gait independence and ADL.    OBJECTIVE IMPAIRMENTS Abnormal gait, decreased activity tolerance, decreased balance, decreased endurance, decreased mobility, difficulty walking, decreased ROM, decreased strength, decreased safety awareness, impaired flexibility, improper body mechanics, prosthetic dependency , and pain.   ACTIVITY LIMITATIONS carrying, lifting, standing, squatting, stairs, transfers, bed mobility, bathing, toileting, dressing, hygiene/grooming, and locomotion level  PARTICIPATION LIMITATIONS:  cleaning, laundry, driving, shopping, community activity, and yard work  [place] Past/current experiences are also affecting patient's functional outcome.   REHAB POTENTIAL: Good  CLINICAL DECISION MAKING: Evolving/moderate complexity  EVALUATION COMPLEXITY: High   GOALS: Goals reviewed with patient? Yes  SHORT TERM GOALS: Target date: 10/07/21 Pt. Able to don/doff R prosthetic leg independently to improve ADL/standing tolerance.  Baseline:  min. A to don/ doff prosthetic leg Goal status: Goal met   LONG TERM GOALS: Target date: 03/29/22  Pt. Will increase FOTO to 54 to improve functional mobility.  Baseline: initial FOTO 46.  11/2: 50 Goal status: Partially met  2.  Pt. Able to tolerate standing 10 minutes while pulling up pants with mod. I safely while wearing prosthetic leg to improve daily activities.  Baseline:  limited standing tolerance Goal status: Not met  3.  Pt. Will ambulate 200 feet with use of SPC and mod. I to promote safety with household tasks/ walking into bathroom.   Baseline:  amb. With RW and min. A for safety.  11/2:   Goal status: Not met   PLAN: PT FREQUENCY: 2x/week  PT DURATION: 8 weeks  PLANNED INTERVENTIONS: Therapeutic exercises, Therapeutic activity, Neuromuscular re-education, Balance training, Gait training, Patient/Family education, Self Care, Joint mobilization, Prosthetic training, DME instructions, and Manual therapy  PLAN FOR NEXT SESSION:  Discuss Staci Righter f/u   Pura Spice, PT, DPT # [phone] 02/24/2022, 11:23 AM

## 2022-02-26 NOTE — Therapy (Signed)
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   Hypertension    PONV (postoperative nausea and vomiting)    Seasonal allergies    Sleep apnea    does not uses CPAP machine anymore    Wheelchair dependent    able to self transfer   Past Surgical History:  Procedure Laterality Date   APPENDECTOMY     CESAREAN SECTION     CHOLECYSTECTOMY     DILATION AND CURETTAGE OF UTERUS     ESOPHAGOGASTRODUODENOSCOPY N/A 02/22/2019   Procedure: ESOPHAGOGASTRODUODENOSCOPY (EGD);  Surgeon: Toledo, Benay Pike, MD;  Location: [facility];  Service: Gastroenterology;  Laterality: N/A;   FLEXIBLE SIGMOIDOSCOPY N/A 02/22/2019   Procedure: FLEXIBLE SIGMOIDOSCOPY;  Surgeon: Toledo, Benay Pike, MD;  Location: [facility];  Service: Gastroenterology;  Laterality: N/A;   HARDWARE REMOVAL Right 11/12/2019   Procedure: Right ankle hardware removal;  Surgeon: Hessie Knows, MD;  Location: [facility];  Service: Orthopedics;  Laterality: Right;   HARDWARE REMOVAL Right 04/17/2020   Procedure: HARDWARE REMOVAL FROM RIGHT ANKLE AND RIGHT KNEE; IMPLANT OF CEMENT SPACERS IN RIGHT KNEE;  Surgeon: Dereck Leep, MD;  Location: [facility];  Service: Orthopedics;  Laterality: Right;   JOINT REPLACEMENT     LEG AMPUTATION ABOVE KNEE Right 12/02/2020   [facility]   ORIF ANKLE FRACTURE Right 03/14/2019   Procedure: OPEN REDUCTION INTERNAL FIXATION (ORIF) ANKLE  FRACTURE, MEDIAL MALLEOLUS;  Surgeon: Hessie Knows, MD;  Location: [facility];  Service: Orthopedics;  Laterality: Right;   ORIF ANKLE FRACTURE Right 02/08/2019   Procedure: OPEN REDUCTION INTERNAL FIXATION (ORIF) ANKLE FRACTURE, post malleolus;  Surgeon: Hessie Knows, MD;  Location: [facility];  Service: Orthopedics;  Laterality: Right;   PARTIAL HYSTERECTOMY  1993   REPLACEMENT TOTAL KNEE Bilateral 2008   2009   SCAR DEBRIDEMENT OF TOTAL KNEE  01/2020   SYNDESMOSIS REPAIR Right 02/08/2019   Procedure: SYNDESMOSIS REPAIR;  Surgeon: Hessie Knows, MD;  Location: [facility];  Service: Orthopedics;  Laterality: Right;   TEE WITHOUT CARDIOVERSION N/A 02/26/2019   Procedure: TRANSESOPHAGEAL ECHOCARDIOGRAM (TEE);  Surgeon: Corey Skains, MD;  Location: [facility];  Service: Cardiovascular;  Laterality: N/A;   TOTAL KNEE REVISION WITH SCAR DEBRIDEMENT/PATELLA REVISION WITH POLY EXCHANGE Right 01/24/2020   Procedure: TOTAL KNEE REVISION WITH SCAR DEBRIDEMENT/PATELLA REVISION WITH POLY EXCHANGE;  Surgeon: Dereck Leep, MD;  Location: [facility];  Service: Orthopedics;  Laterality: Right;   TUMOR REMOVAL     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   Patient Active Problem List   Diagnosis Date Noted   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[place]) 04/17/2020   Anxiety 04/17/2020   History of GI diverticular bleed 04/17/2020   Chest pain 04/17/2020   PAD (peripheral artery disease) ([place]) 04/05/2020  Ulcer of right leg ([place]) 04/05/2020   COPD (chronic obstructive pulmonary disease) ([place]) 04/05/2020   GERD (gastroesophageal reflux disease) 04/05/2020   Abscess of right knee 01/22/2020   Pressure injury of skin 02/28/2019   SOB (shortness of breath)    Gastrointestinal hemorrhage    Sepsis (HCC)    Symptomatic anemia 02/20/2019   Trimalleolar fracture of ankle, closed, right, initial encounter 02/07/2019   Multiple lung nodules 03/31/2014   Extrinsic asthma 03/27/2014   OSA on CPAP 03/27/2014    PCP:  Rusty Aus, MD  REFERRING PROVIDER: Rusty Aus, MD  REFERRING DIAG: Above knee amputation of right lower extremity  THERAPY DIAG:  S/P AKA (above knee amputation) unilateral, right (HCC)  Muscle weakness (generalized)  Gait difficulty  Abnormal posture  Rationale for Evaluation and Treatment Rehabilitation  ONSET DATE: 12/02/20  SUBJECTIVE:  EVALUATION  PERTINENT HISTORY: Patient Profile:   Jasmine Morrow is a 77 y.o. female Chief Complaint  Patient presents with  Visit Follow Up  Doing well.   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 Cardiac cath 10/2011 showed completely normal coronary arteries, however she did have mild left ventricular dysfunction with anterior wall motion abnormality. Left ventricular EF was 51%.  History of chickenpox  Hyperlipidemia  patient states she has never been diagnosed with this  Hypertension  Knee joint replacement by other means  Macular degeneration  Major depressive disorder, recurrent, mild (CMS-HCC)  Medicare annual wellness visit, initial 08/09/2019  7/21  Multiple thyroid nodules  Normal coronary arteries 09/06/2013  Normal coronary anatomy by cardiac catheterization 10/12/11  Osteoarthritis  Ovarian cyst  PONV (postoperative nausea and vomiting)  nausea  Postoperative urinary retention 12/08/2020  Rash on lips 12/21/2020   Seasonal allergies  Sinusitis, unspecified  Sleep apnea  On CPAP.  Slow transit constipation 12/10/2020  Trimalleolar fracture of ankle, closed, right, initial encounter 78/29/5621  Complicated by staph infection 1/21, 4 weeks IV Ancef, [facility]  Ulcer   Past Surgical History:  Procedure Laterality Date  SALPINGO OOPHORECTOMY Bilateral 1993  Right total knee arthroplasty 10/11/2006  Dr Marry Guan  Left total knee arthroplasty 10/12/2007  Dr Marry Guan  COLONOSCOPY 07/30/2012  internal hemorrhoids, diverticulosis  ANKLE ARTHODESIS W/ ARTHROSCOPY Right 02/08/2019  03/14/2019 second surgery  COLONOSCOPY 02/22/2019  Blood in entire colon/Diverticulosis - Presumed diverticular bleed. No repeat recommended per TKT.  EGD 02/22/2019  Gastritis/gastric ulcers/Hiatal hernia/Otherwise normal - no repeat recommended per TKT.  ORIF ANKLE FRACTURE Right 03/14/2019  Dr. Rudene Christians  REMOVAL HARDWARE ANKLE FOOT/TOES Right 11/12/2019  Dr. Rudene Christians  Right knee arthrotomy, irrigation and debridement of the right knee, polyethylene exchange 01/24/2020  Dr Marry Guan  Removal of hardware (plates and screws) from the right ankle with irrigation, debridement, and placement of Stimulan antibiotic beads 04/17/2020  Dr Marry Guan  Right knee arthrotomy, extensive irrigation debridement, removal of right total knee implants, and placement of antibiotic impregnated polymethylmethacrylate cement spacer 04/17/2020  Dr Marry Guan  AMPUTATION LEG ABOVE KNEE AKA  Right 12/02/2020  Procedure: AMPUTATION, THIGH, THROUGH FEMUR, ANY LEVEL; Surgeon: Lyndle Herrlich, MD; Location: [place]; Service: Orthopedics; Laterality: Right;  TRANSFER ADJACENT TISSUE LEG Right 12/02/2020  Procedure: ADJACENT TISSUE TRANSFER OR REARRANGEMENT, LEG; DEFECT 10 SQ CM OR LESS; Surgeon: Lyndle Herrlich, MD; Location: [facility]; Service: Orthopedics; Laterality: Right;  ASPIRATION/INJECTION MAJOR JOINT/BURSA KNEE Left 12/02/2020  Procedure:  ARTHROCENTESIS, ASPIRATION AND/OR INJECTION, MAJOR JOINT OR BURSA, KNEE; WITHOUT ULTRASOUND GUIDANCE; Surgeon: Lyndle Herrlich, MD; Location: [facility]; Service: Orthopedics; Laterality: Left;  REMOVAL KNEE PROSTHESIS W/POSSIBLE PLACEMENT SPACER Left 01/18/2021  Procedure: REMOVAL OF PROSTHESIS, INCLUDING TOTAL KNEE PROSTHESIS, METHYLMETHACRYLATE WITH OR WITHOUT INSERTION OF SPACER, KNEE; Surgeon: Ihor Dow, MD; Location: [place]; Service: Orthopedics; Laterality: Left;  INSERTION NON-BIODEGRADABLE DRUG DELIVERY IMPLANT Left 01/18/2021  Procedure: INSERTION, KNEE, bioresorbable, biodegradable, NON-BIODEGRADABLE DRUG DELIVERY IMPLANT; Surgeon: Ihor Dow, MD; Location: [place]; Service: Orthopedics; Laterality: Left;  ABOVE KNEE LEG AMPUTATION  12/02/2020  APPENDECTOMY Est 1968  CESAREAN SECTION  CHOLECYSTECTOMY  FRACTURE SURGERY 1/21  HYSTERECTOMY partial  JOINT REPLACEMENT 2008 & 2009  TUBAL LIGATION 3/79   ALLERGIES: Allergies  Allergen Reactions  Singulair [Montelukast] Other (See Comments)  Gland swelling  Sulfa (Sulfonamide Antibiotics) Swelling  Oxycodone Dizziness and Other (See Comments)  Vancomycin Analogues Other (See Comments)  Ringing in ears  Avinza [Morphine] Itching  Darvocet A500 [Propoxyphene N-Acetaminophen] Nausea  Nickel Rash  Ultracet [Tramadol-Acetaminophen] Rash  Vicodin [Hydrocodone-Acetaminophen] Vomiting  Vioxx [Rofecoxib] Other (See Comments)  GI   CURRENT MEDICATIONS: Current Outpatient Medications: acetaminophen (TYLENOL) 500 MG tablet, Take 1,000 mg by mouth 3 (three) times a day Twice daily per patient., PRN Not Currently Taking aspirin 81 MG EC tablet, Take 1 tablet (81 mg total) by mouth at bedtime, Taking calcium carbonate-vitamin D3 (CALTRATE 600+D) 600 mg-10 mcg (400 unit) tablet, Take 0.5 tablets by mouth 2 (two) times daily, Taking cetirizine (ZYRTEC) 10 mg capsule, Take 1 capsule (10 mg total) by  mouth once daily, Taking escitalopram oxalate (LEXAPRO) 10 MG tablet, Take 1 tablet (10 mg total) by mouth once daily, Taking estradioL (ESTRACE) 0.01 % (0.1 mg/gram) vaginal cream, Place 2 g vaginally twice a week, Taking folic acid/multivit,iron,miner (MULTIVIT-IRON-MIN-FOLIC ACID ORAL), Take 1 tablet by mouth once daily, Taking gabapentin (NEURONTIN) 300 MG capsule, Take 1 capsule (300 mg total) by mouth 3 (three) times daily Phantom limb pain, Taking melatonin 3 mg tablet, Take 1 tablet (3 mg total) by mouth at bedtime, Taking multivitamin with iron (COMPLETE MULTIVITAMIN-MINERAL) tablet, Take 1 tablet by mouth, Taking multivitamin with minerals, EYE, (PRESERVISION AREDS 2) soft gel capsule, Take 1 capsule by mouth 2 (two) times daily, Taking nystatin (MYCOSTATIN) 100,000 unit/gram powder, Apply small amount topically twice daily for rash under breasts, PRN Not Currently Taking pantoprazole (PROTONIX) 40 MG DR tablet, Take 1 tablet (40 mg total) by mouth 2 (two) times daily. Please call to schedule an appt. Thanks!, Taking polyethylene glycol (MIRALAX) powder, Take 17 g by mouth once daily Mix in 4-8ounces of fluid prior to taking., Taking sennosides-docusate (SENOKOT-S) 8.6-50 mg tablet, Take 1 tablet by mouth 2 (two) times daily For constipation, Taking lactose-reduced food (ENSURE PLUS ORAL), Take 237 mLs by mouth 2 (two) times daily With breakfast and dinner  HPI   CLINICAL SUMMARY:  Patient post right AKA and working with prosthesis. 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 Able to do housework, improving  PAIN:  Are you having pain? Yes:  NPRS scale: 5/10 Pain location: L knee Pain description: aching Aggravating factors: walking Relieving factors: rest  PRECAUTIONS: None  WEIGHT BEARING RESTRICTIONS No  FALLS:  Has patient fallen in last 6 months? No  LIVING ENVIRONMENT: Lives with: lives with their spouse Lives in: House/apartment Stairs:  No Has following equipment at home: Gilford Rile - 4 wheeled and Wheelchair (manual)  OCCUPATION: retired  PLOF: Requires assistive device for independence  PATIENT GOALS  improve standing tolerance/ pull up pants/ ambulate with [place] safely.     OBJECTIVE:   PATIENT SURVEYS:  FOTO initial 46/ goal 74.   11/2: 50  COGNITION:  Overall cognitive status: Within functional limits for tasks assessed     SENSATION: WFL  POSTURE: rounded shoulders, forward head, flexed trunk , and weight shift left  PALPATION: No tenderness along R distal residual limb.  Discussed phantom limb sensation.    LOWER EXTREMITY ROM:  B LE AROM WFL except L knee extension secondary to articulating spacer.  Did not assess L/R hip extension at this time.    LOWER EXTREMITY MMT:  MMT Right eval Left eval  Hip flexion 4/5 4/5  Hip extension    Hip abduction 4+/5 4+/5  Hip adduction 4+/5 4+/5  Hip internal rotation    Hip external rotation    Knee flexion N/A 4+/5  Knee extension N/A 4+/5  Ankle dorsiflexion  5/5  Ankle plantarflexion    Ankle inversion    Ankle eversion     (Blank rows = not tested)  GAIT: Distance walked: in gym/ //-bars Assistive device utilized: Environmental consultant - 2 wheeled Level of assistance: CGA Comments: Cuing for posture correction with mirror feedback.  Flexed posture/ heavy UE assist.     TODAY'S TREATMENT:  02/17/22  Subjective:  Pt. Reports no new complaints.  Pt. Arrived in w/c and donned prosthetic leg on blue mat table prior to tx. Session.  Pt. Was able to walk at home with daughter in law/ granddaughter since last PT visit.      Objective:    There.ex.:      Nustep L4 10+ min. With B UE/LE.  Use of R toe clip to improve placement of R foot.   Standing wt. Shifting in //-bars with no UE assist/ upright posture correction with shoulder width BOS.    Sit to stands from varying heights of blue mat with use of RW/ proper technique.  SBA/CGA from PT for safety.  Good R  prosthetic leg/ knee mechanism control.       Gait training:    Amb. From blue mat table to hallway (188 feet total) before required seated rest break.  Moderate cuing to correct upright posture and prevent forward lean/ heavy UE assist on RW.  Pts. Prosthetic leg fitting better today and will discuss proper fit with Staci Righter next week.   No LOB.    Ascend/ descend stairs 2x with step to gait and no knee buckling issues today.  Heavy UE assist and focus on R knee mechanism control and preventing toe clipping on step.      PATIENT EDUCATION:  Education details: HEP/ gait and prosthetic training.  Person educated: Patient and Spouse Education method: Explanation, Demonstration, and Verbal cues Education comprehension: verbalized understanding, returned demonstration, and tactile cues required   HOME EXERCISE PROGRAM: Prone position hip stretches/ Standing marching at kitchen counter with w/c behind patient.      ASSESSMENT:  CLINICAL IMPRESSION: Pt. ambulates around PT clinic with use of prosthetic leg/ RW  with consistent recip. With increase distances today.  Pt. Did well with walking endurance but requires cuing to correct posture when fatigued due to heavy UE assist on RW.  Moderate LE/ generalized muscle fatigue noted today with sit to stands.   SBA/CGA from PT for safety, esp. While making initial step ups.  Pt. Encouraged to walk more at home with daughter in law/ granddaughter assistance for safety.  Pt. Will benefit from skilled PT services to improve standing tolerance/ gait independence and ADL.    OBJECTIVE IMPAIRMENTS Abnormal gait, decreased activity tolerance, decreased balance, decreased endurance, decreased mobility, difficulty walking, decreased ROM, decreased strength, decreased safety awareness, impaired flexibility, improper body mechanics, prosthetic dependency , and pain.   ACTIVITY LIMITATIONS carrying, lifting, standing, squatting, stairs, transfers, bed mobility,  bathing, toileting, dressing, hygiene/grooming, and locomotion level  PARTICIPATION LIMITATIONS: cleaning, laundry, driving, shopping, community activity, and yard work  [place] Past/current experiences are also affecting patient's functional outcome.   REHAB POTENTIAL: Good  CLINICAL DECISION MAKING: Evolving/moderate complexity  EVALUATION COMPLEXITY: High   GOALS: Goals reviewed with patient? Yes  SHORT TERM GOALS: Target date: 10/07/21 Pt. Able to don/doff R prosthetic leg independently to improve ADL/standing tolerance.  Baseline:  min. A to don/ doff prosthetic leg Goal status: Goal met   LONG TERM GOALS: Target date: 03/29/22  Pt. Will increase FOTO to 54 to improve functional mobility.  Baseline: initial FOTO 46.  11/2: 50 Goal status: Partially met  2.  Pt. Able to tolerate standing 10 minutes while pulling up pants with mod. I safely while wearing prosthetic leg to improve daily activities.  Baseline:  limited standing tolerance Goal status: Not met  3.  Pt. Will ambulate 200 feet with use of SPC and mod. I to promote safety with household tasks/ walking into bathroom.   Baseline:  amb. With RW and min. A for safety.  11/2:   Goal status: Not met   PLAN: PT FREQUENCY: 2x/week  PT DURATION: 8 weeks  PLANNED INTERVENTIONS: Therapeutic exercises, Therapeutic activity, Neuromuscular re-education, Balance training, Gait training, Patient/Family education, Self Care, Joint mobilization, Prosthetic training, DME instructions, and Manual therapy  PLAN FOR NEXT SESSION:  Progress gait/ step ups.   Pura Spice, PT, DPT # [phone] 02/22/2022, 11:23 AM

## 2022-02-28 ENCOUNTER — Ambulatory Visit: Payer: Medicare Other | Admitting: Anesthesiology

## 2022-02-28 ENCOUNTER — Ambulatory Visit
Admission: RE | Admit: 2022-02-28 | Discharge: 2022-02-28 | Disposition: A | Discharge status: Home / Self Care | Payer: Medicare Other | Source: Ambulatory Visit | Attending: Ophthalmology | Admitting: Ophthalmology

## 2022-02-28 ENCOUNTER — Encounter
Admission: RE | Disposition: A | Discharge status: Home / Self Care | Payer: Self-pay | Source: Ambulatory Visit | Attending: Ophthalmology

## 2022-02-28 ENCOUNTER — Encounter: Payer: Self-pay | Admitting: Ophthalmology

## 2022-02-28 DIAGNOSIS — I10 Essential (primary) hypertension: Secondary | ICD-10-CM | POA: Insufficient documentation

## 2022-02-28 DIAGNOSIS — G4730 Sleep apnea, unspecified: Secondary | ICD-10-CM | POA: Diagnosis not present

## 2022-02-28 DIAGNOSIS — Z87891 Personal history of nicotine dependence: Secondary | ICD-10-CM | POA: Diagnosis not present

## 2022-02-28 DIAGNOSIS — J4489 Other specified chronic obstructive pulmonary disease: Secondary | ICD-10-CM | POA: Diagnosis not present

## 2022-02-28 DIAGNOSIS — I739 Peripheral vascular disease, unspecified: Secondary | ICD-10-CM | POA: Insufficient documentation

## 2022-02-28 DIAGNOSIS — H2511 Age-related nuclear cataract, right eye: Secondary | ICD-10-CM | POA: Insufficient documentation

## 2022-02-28 DIAGNOSIS — K219 Gastro-esophageal reflux disease without esophagitis: Secondary | ICD-10-CM | POA: Diagnosis not present

## 2022-02-28 HISTORY — PX: CATARACT EXTRACTION W/PHACO: SHX586

## 2022-02-28 HISTORY — DX: Dependence on wheelchair: Z99.3

## 2022-02-28 SURGERY — PHACOEMULSIFICATION, CATARACT, WITH IOL INSERTION
Anesthesia: Monitor Anesthesia Care | Site: Eye | Laterality: Right

## 2022-02-28 MED ORDER — MIDAZOLAM HCL 2 MG/2ML IJ SOLN
INTRAMUSCULAR | Status: DC | PRN
  Administered 2022-02-28: 2 mg via INTRAVENOUS

## 2022-02-28 MED ORDER — SIGHTPATH DOSE#1 NA HYALUR & NA CHOND-NA HYALUR IO KIT
PACK | INTRAOCULAR | Status: DC | PRN
  Administered 2022-02-28: 1 via OPHTHALMIC

## 2022-02-28 MED ORDER — LIDOCAINE HCL (PF) 2 % IJ SOLN
INTRAOCULAR | Status: DC | PRN
  Administered 2022-02-28: 1 mL via INTRAOCULAR

## 2022-02-28 MED ORDER — ARMC OPHTHALMIC DILATING DROPS
1.0000 | OPHTHALMIC | Status: DC | PRN
  Administered 2022-02-28 (×3): 1 via OPHTHALMIC

## 2022-02-28 MED ORDER — SIGHTPATH DOSE#1 BSS IO SOLN
INTRAOCULAR | Status: DC | PRN
  Administered 2022-02-28: 66 mL via OPHTHALMIC

## 2022-02-28 MED ORDER — LACTATED RINGERS IV SOLN: INTRAVENOUS | Status: DC

## 2022-02-28 MED ORDER — MOXIFLOXACIN HCL 0.5 % OP SOLN
OPHTHALMIC | Status: DC | PRN
  Administered 2022-02-28: .2 mL via OPHTHALMIC

## 2022-02-28 MED ORDER — TETRACAINE HCL 0.5 % OP SOLN
1.0000 [drp] | OPHTHALMIC | Status: DC | PRN
  Administered 2022-02-28 (×3): 1 [drp] via OPHTHALMIC

## 2022-02-28 MED ORDER — SIGHTPATH DOSE#1 BSS IO SOLN
INTRAOCULAR | Status: DC | PRN
  Administered 2022-02-28: 15 mL

## 2022-02-28 MED ORDER — FENTANYL CITRATE (PF) 100 MCG/2ML IJ SOLN
INTRAMUSCULAR | Status: DC | PRN
  Administered 2022-02-28: 25 ug via INTRAVENOUS

## 2022-02-28 SURGICAL SUPPLY — 13 items
CATARACT SUITE SIGHTPATH (MISCELLANEOUS) ×1 IMPLANT
DISSECTOR HYDRO NUCLEUS 50X22 (MISCELLANEOUS) ×1 IMPLANT
FEE CATARACT SUITE SIGHTPATH (MISCELLANEOUS) ×1 IMPLANT
GLOVE SURG GAMMEX PI TX LF 7.5 (GLOVE) ×1 IMPLANT
GLOVE SURG SYN 8.5  E (GLOVE) ×1
GLOVE SURG SYN 8.5 E (GLOVE) ×1 IMPLANT
GLOVE SURG SYN 8.5 PF PI (GLOVE) ×1 IMPLANT
LENS IOL TECNIS EYHANCE 24.5 (Intraocular Lens) IMPLANT
NDL FILTER BLUNT 18X1 1/2 (NEEDLE) ×1 IMPLANT
NEEDLE FILTER BLUNT 18X1 1/2 (NEEDLE) ×1 IMPLANT
SYR 3ML LL SCALE MARK (SYRINGE) ×1 IMPLANT
SYR 5ML LL (SYRINGE) ×1 IMPLANT
WATER STERILE IRR 250ML POUR (IV SOLUTION) ×1 IMPLANT

## 2022-02-28 NOTE — Anesthesia Postprocedure Evaluation (Signed)
Anesthesia Post Note  Patient: ANTONISHA WASKEY  Procedure(s) Performed: CATARACT EXTRACTION PHACO AND INTRAOCULAR LENS PLACEMENT (IOC) RIGHT  4.82  00:30.9 (Right: Eye)  Patient location during evaluation: PACU Anesthesia Type: MAC Level of consciousness: awake and alert Pain management: pain level controlled Vital Signs Assessment: post-procedure vital signs reviewed and stable Respiratory status: spontaneous breathing, nonlabored ventilation, respiratory function stable and patient connected to nasal cannula oxygen Cardiovascular status: stable and blood pressure returned to baseline Postop Assessment: no apparent nausea or vomiting Anesthetic complications: no   No notable events documented.   Last Vitals:  Vitals:   02/28/22 1201 02/28/22 1208  BP: 131/68 122/72  Pulse: 75 79  Resp: 16 19  Temp: (!) 36.1 C (!) 36.1 C  SpO2: 97% 97%    Last Pain:  Vitals:   02/28/22 1208  TempSrc:   PainSc: 0-No pain                 Martha Clan

## 2022-02-28 NOTE — H&P (Signed)
[place]   Primary Care Physician:  Rusty Aus, MD Ophthalmologist: Dr. Benay Pillow  Pre-Procedure History & Physical: HPI:  Jasmine Morrow is a 77 y.o. female here for cataract surgery.   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   Hypertension    PONV (postoperative nausea and vomiting)    Seasonal allergies    Sleep apnea    does not uses CPAP machine anymore    Wheelchair dependent    able to self transfer    Past Surgical History:  Procedure Laterality Date   APPENDECTOMY     CESAREAN SECTION     CHOLECYSTECTOMY     DILATION AND CURETTAGE OF UTERUS     ESOPHAGOGASTRODUODENOSCOPY N/A 02/22/2019   Procedure: ESOPHAGOGASTRODUODENOSCOPY (EGD);  Surgeon: Toledo, Benay Pike, MD;  Location: [facility];  Service: Gastroenterology;  Laterality: N/A;   FLEXIBLE SIGMOIDOSCOPY N/A 02/22/2019   Procedure: FLEXIBLE SIGMOIDOSCOPY;  Surgeon: Toledo, Benay Pike, MD;  Location: [facility];  Service: Gastroenterology;  Laterality: N/A;   HARDWARE REMOVAL Right 11/12/2019   Procedure: Right ankle hardware removal;  Surgeon: Hessie Knows, MD;  Location: [facility];  Service: Orthopedics;  Laterality: Right;   HARDWARE REMOVAL Right 04/17/2020   Procedure: HARDWARE REMOVAL FROM RIGHT ANKLE AND RIGHT KNEE; IMPLANT OF CEMENT SPACERS IN RIGHT KNEE;  Surgeon: Dereck Leep, MD;  Location: [facility];  Service: Orthopedics;  Laterality: Right;   JOINT REPLACEMENT     LEG AMPUTATION ABOVE KNEE Right 12/02/2020   [facility]   ORIF ANKLE FRACTURE Right 03/14/2019   Procedure: OPEN REDUCTION INTERNAL FIXATION (ORIF) ANKLE FRACTURE, MEDIAL MALLEOLUS;  Surgeon: Hessie Knows, MD;  Location: [facility];  Service: Orthopedics;  Laterality: Right;   ORIF ANKLE FRACTURE Right 02/08/2019   Procedure: OPEN REDUCTION INTERNAL FIXATION (ORIF) ANKLE FRACTURE, post  malleolus;  Surgeon: Hessie Knows, MD;  Location: [facility];  Service: Orthopedics;  Laterality: Right;   PARTIAL HYSTERECTOMY  1993   REPLACEMENT TOTAL KNEE Bilateral 2008   2009   SCAR DEBRIDEMENT OF TOTAL KNEE  01/2020   SYNDESMOSIS REPAIR Right 02/08/2019   Procedure: SYNDESMOSIS REPAIR;  Surgeon: Hessie Knows, MD;  Location: [facility];  Service: Orthopedics;  Laterality: Right;   TEE WITHOUT CARDIOVERSION N/A 02/26/2019   Procedure: TRANSESOPHAGEAL ECHOCARDIOGRAM (TEE);  Surgeon: Corey Skains, MD;  Location: [facility];  Service: Cardiovascular;  Laterality: N/A;   TOTAL KNEE REVISION WITH SCAR DEBRIDEMENT/PATELLA REVISION WITH POLY EXCHANGE Right 01/24/2020   Procedure: TOTAL KNEE REVISION WITH SCAR DEBRIDEMENT/PATELLA REVISION WITH POLY EXCHANGE;  Surgeon: Dereck Leep, MD;  Location: [facility];  Service: Orthopedics;  Laterality: Right;   TUMOR REMOVAL     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 Authorizing Provider  Calcium-Vitamin D 600-200 MG-UNIT per tablet Take 2 tablets by mouth daily.   Yes [provider]  cetirizine (ZYRTEC) 10 MG tablet Take 10 mg by mouth in the morning.   Yes [provider]  Docusate Calcium (STOOL SOFTENER PO) Take by mouth in the morning and at bedtime.   Yes [provider]  escitalopram (LEXAPRO) 10 MG tablet Take 10 mg by mouth daily. 10/17/19  Yes [provider]  gabapentin (NEURONTIN) 300 MG capsule Take 300  mg by mouth 3 (three) times daily.   Yes [provider]  hydrochlorothiazide (HYDRODIURIL) 12.5 MG tablet Take 12.5 mg by mouth daily as needed.   Yes [provider]  melatonin 5 MG TABS Take 5 mg by mouth at bedtime as needed.   Yes [provider]  minocycline (DYNACIN) 100 MG tablet Take 100 mg by mouth 2 (two) times daily.   Yes [provider]  Multiple Vitamins-Minerals (PRESERVISION  AREDS 2 PO) Take 1 tablet by mouth 2 (two) times daily.   Yes [provider]  nystatin ointment (MYCOSTATIN) Apply 1 application topically daily as needed for irritation. 10/02/19  Yes [provider]  ondansetron (ZOFRAN) 4 MG tablet Take 1 tablet (4 mg total) by mouth 2 (two) times daily as needed for nausea or vomiting. 03/18/20  Yes Tsosie Billing, MD  pantoprazole (PROTONIX) 40 MG tablet Take 40 mg by mouth 2 (two) times daily.   Yes [provider]  polyethylene glycol (MIRALAX) 17 g packet Take 17 g by mouth daily as needed. Patient taking differently: Take 17 g by mouth daily. 01/31/20  Yes Shelly Coss, MD  fluconazole (DIFLUCAN) 100 MG tablet Take 1 tablet (100 mg total) by mouth daily. Patient not taking: Reported on 02/23/2022 08/17/20   Tsosie Billing, MD  Multiple Vitamin (MULTIVITAMIN WITH MINERALS) TABS tablet Take 1 tablet by mouth daily. Patient not taking: Reported on 02/28/2022    [provider]  Skin Protectants, Misc. (CALAZIME SKIN PROTECTANT EX) Apply 1 application topically 4 (four) times daily as needed (diaper changes).    [provider]  traMADol (ULTRAM) 50 MG tablet Take 1 tablet (50 mg total) by mouth every 4 (four) hours as needed for moderate pain. Patient not taking: Reported on 02/23/2022 04/23/20   Tamala Julian B, PA-C    Allergies as of 02/11/2022 - Review Complete 11/10/2021  Allergen Reaction Noted   Celecoxib Nausea Only 03/27/2014   Doxycycline  04/08/2020   Hydrocodone-acetaminophen Nausea And Vomiting 03/27/2014   Meloxicam Nausea Only 03/27/2014   Morphine sulfate er beads Itching 03/27/2014   Naproxen Swelling 03/27/2014   Oxycodone Other (See Comments) 01/24/2020   Rofecoxib Nausea Only 03/27/2014   Singulair [montelukast] Swelling 04/06/2020   Sulfa antibiotics Other (See Comments) 02/07/2019   Tape Dermatitis 04/15/2020   Nickel Rash 03/27/2014    Family History  Problem  Relation Age of Onset   Bone cancer Mother    COPD Father     Social History   Socioeconomic History   Marital status: Married    Spouse name: Not on file   Number of children: Not on file   Years of education: Not on file   Highest education level: Not on file  Occupational History   Not on file  Tobacco Use   Smoking status: Former    Packs/day: 2.00    Years: 30.00    Total pack years: 60.00    Types: Cigarettes    Quit date: 10/02/1990    Years since quitting: 31.4   Smokeless tobacco: Never  Vaping Use   Vaping Use: Never used  Substance and Sexual Activity   Alcohol use: No    Alcohol/week: 0.0 standard drinks of alcohol   Drug use: No   Sexual activity: Not on file  Other Topics Concern   Not on file  Social History Narrative   Not on file   Social Determinants of Health   Financial Resource Strain: Not on file  Food  Insecurity: Not on file  Transportation Needs: Not on file  Physical Activity: Not on file  Stress: Not on file  Social Connections: Not on file  Intimate Partner Violence: Not on file    Review of Systems: See HPI, otherwise negative ROS  Physical Exam: BP (!) 160/80   Pulse 96   Temp 98.1 F (36.7 C) (Temporal)   Resp 18   Ht '5\' 8"'$  (1.727 m)   Wt 90.7 kg   SpO2 99%   BMI 30.40 kg/m  General:   Alert, cooperative in NAD Head:  Normocephalic and atraumatic. Respiratory:  Normal work of breathing. Cardiovascular:  RRR  Impression/Plan: Jasmine Morrow is here for cataract surgery.  Risks, benefits, limitations, and alternatives regarding cataract surgery have been reviewed with the patient.  Questions have been answered.  All parties agreeable.   Benay Pillow, MD  02/28/2022, 11:19 AM

## 2022-02-28 NOTE — Anesthesia Preprocedure Evaluation (Signed)
Anesthesia Evaluation  Patient identified by MRN, date of birth, ID band Patient awake    Reviewed: Allergy & Precautions, H&P , NPO status , Patient's Chart, lab work & pertinent test results  History of Anesthesia Complications (+) PONV and history of anesthetic complications  Airway Mallampati: III  TM Distance: <3 FB Neck ROM: limited    Dental  (+) Chipped, Dental Advidsory Given   Pulmonary neg shortness of breath, asthma , sleep apnea , COPD, former smoker   Pulmonary exam normal        Cardiovascular Exercise Tolerance: Good hypertension, + Peripheral Vascular Disease  Normal cardiovascular exam     Neuro/Psych  PSYCHIATRIC DISORDERS      negative neurological ROS     GI/Hepatic Neg liver ROS,GERD  Medicated and Controlled,,  Endo/Other  negative endocrine ROS    Renal/GU      Musculoskeletal   Abdominal   Peds  Hematology negative hematology ROS (+)   Anesthesia Other Findings Patient reports baseline nausea from her current antibiotic regimen   Past Medical History: No date: Anemia No date: Anxiety No date: Asthma     Comment:  seasonal  No date: GERD (gastroesophageal reflux disease) No date: Hypertension No date: PONV (postoperative nausea and vomiting) No date: Seasonal allergies No date: Sleep apnea     Comment:  does not uses CPAP machine anymore   Past Surgical History: No date: APPENDECTOMY No date: CESAREAN SECTION No date: CHOLECYSTECTOMY No date: DILATION AND CURETTAGE OF UTERUS 02/22/2019: ESOPHAGOGASTRODUODENOSCOPY; N/A     Comment:  Procedure: ESOPHAGOGASTRODUODENOSCOPY (EGD);  Surgeon:               Toledo, Benay Pike, MD;  Location: [facility];                Service: Gastroenterology;  Laterality: N/A; 02/22/2019: FLEXIBLE SIGMOIDOSCOPY; N/A     Comment:  Procedure: FLEXIBLE SIGMOIDOSCOPY;  Surgeon: Toledo,               Benay Pike, MD;  Location: [facility];  Service:                Gastroenterology;  Laterality: N/A; 11/12/2019: HARDWARE REMOVAL; Right     Comment:  Procedure: Right ankle hardware removal;  Surgeon: Hessie Knows, MD;  Location: [facility];  Service: Orthopedics;               Laterality: Right; No date: JOINT REPLACEMENT 03/14/2019: ORIF ANKLE FRACTURE; Right     Comment:  Procedure: OPEN REDUCTION INTERNAL FIXATION (ORIF) ANKLE              FRACTURE, MEDIAL MALLEOLUS;  Surgeon: Hessie Knows, MD;               Location: [facility];  Service: Orthopedics;  Laterality:               Right; 02/08/2019: ORIF ANKLE FRACTURE; Right     Comment:  Procedure: OPEN REDUCTION INTERNAL FIXATION (ORIF) ANKLE              FRACTURE, post malleolus;  Surgeon: Hessie Knows, MD;                Location: [facility];  Service: Orthopedics;  Laterality:               Right; 2008: REPLACEMENT TOTAL KNEE; Bilateral     Comment:  2009 01/2020: SCAR DEBRIDEMENT  OF TOTAL KNEE 02/08/2019: SYNDESMOSIS REPAIR; Right     Comment:  Procedure: SYNDESMOSIS REPAIR;  Surgeon: Hessie Knows,               MD;  Location: [facility];  Service: Orthopedics;                Laterality: Right; 02/26/2019: TEE WITHOUT CARDIOVERSION; N/A     Comment:  Procedure: TRANSESOPHAGEAL ECHOCARDIOGRAM (TEE);                Surgeon: Corey Skains, MD;  Location: [facility];                Service: Cardiovascular;  Laterality: N/A; 01/24/2020: TOTAL KNEE REVISION WITH SCAR DEBRIDEMENT/PATELLA  REVISION WITH POLY EXCHANGE; Right     Comment:  Procedure: TOTAL KNEE REVISION WITH SCAR               DEBRIDEMENT/PATELLA REVISION WITH POLY EXCHANGE;                Surgeon: Dereck Leep, MD;  Location: [facility];                Service: Orthopedics;  Laterality: Right; No date: TUMOR REMOVAL     Comment:  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  BMI    Body Mass Index: 28.89 kg/m      Reproductive/Obstetrics negative OB ROS                              Anesthesia Physical Anesthesia Plan  ASA: 3  Anesthesia Plan: MAC   Post-op Pain Management:    Induction: Intravenous  PONV Risk Score and Plan: 3 and Midazolam and Treatment may vary due to age or medical condition  Airway Management Planned: Natural Airway and Nasal Cannula  Additional Equipment:   Intra-op Plan:   Post-operative Plan:   Informed Consent: I have reviewed the patients History and Physical, chart, labs and discussed the procedure including the risks, benefits and alternatives for the proposed anesthesia with the patient or authorized representative who has indicated his/her understanding and acceptance.     Dental Advisory Given  Plan Discussed with: Anesthesiologist, CRNA and Surgeon  Anesthesia Plan Comments: (Patient consented for risks of anesthesia including but not limited to:  - adverse reactions to medications - damage to eyes, teeth, lips or other oral mucosa - nerve damage due to positioning  - sore throat or hoarseness - Damage to heart, brain, nerves, lungs, other parts of body or loss of life  Patient voiced understanding.)        Anesthesia Quick Evaluation

## 2022-02-28 NOTE — Transfer of Care (Signed)
Immediate Anesthesia Transfer of Care Note  Patient: Jasmine Morrow  Procedure(s) Performed: CATARACT EXTRACTION PHACO AND INTRAOCULAR LENS PLACEMENT (IOC) RIGHT  4.82  00:30.9 (Right: Eye)  Patient Location: PACU  Anesthesia Type:MAC  Level of Consciousness: awake, alert , and oriented  Airway & Oxygen Therapy: Patient Spontanous Breathing  Post-op Assessment: Report given to RN and Post -op Vital signs reviewed and stable  Post vital signs: stable  Last Vitals:  Vitals Value Taken Time  BP    Temp    Pulse 76 02/28/22 1201  Resp 9 02/28/22 1201  SpO2 94 % 02/28/22 1201  Vitals shown include unvalidated device data.  Last Pain:  Vitals:   02/28/22 1040  TempSrc: Temporal  PainSc: 0-No pain         Complications: No notable events documented.

## 2022-02-28 NOTE — Op Note (Signed)
OPERATIVE NOTE  Jasmine Morrow [id number] 02/28/2022   PREOPERATIVE DIAGNOSIS:  Nuclear sclerotic cataract right eye.  H25.11   POSTOPERATIVE DIAGNOSIS:    Nuclear sclerotic cataract right eye.     PROCEDURE:  Phacoemusification with posterior chamber intraocular lens placement of the right eye   LENS:   Implant Name Type Inv. Item Serial No. Manufacturer Lot No. LRB No. Used Action  LENS IOL TECNIS EYHANCE 24.5 - [id number] Intraocular Lens LENS IOL TECNIS EYHANCE 24.5 [id number] [employer]  Right 1 Implanted       Procedure(s) with comments: CATARACT EXTRACTION PHACO AND INTRAOCULAR LENS PLACEMENT (IOC) RIGHT  4.82  00:30.9 (Right) - sleep apnea  DIB00 +24.5 (verbal plan in preop for near target)   ULTRASOUND TIME: 0 minutes 30 seconds.  CDE 4.82   SURGEON:  Benay Pillow, MD, MPH  ANESTHESIOLOGIST: Anesthesiologist: Martha Clan, MD CRNA: Jacqualin Combes, CRNA; Aline Brochure, CRNA   ANESTHESIA:  Topical with tetracaine drops augmented with 1% preservative-free intracameral lidocaine.  ESTIMATED BLOOD LOSS: less than 1 mL.   COMPLICATIONS:  None.   DESCRIPTION OF PROCEDURE:   In the preoperative waiting area, the patient reported that she wanted a lens implant that would help for reading, and she understood that she would need spectacles for distance vision.   The patient was identified in the holding room and transported to the operating room and placed in the supine position under the operating microscope.  The first chair was nonfunctional, the leg section of the chair would not lift.  After some troubleshooting, the patient was transferred to another operating chair.   The right eye was identified as the operative eye and it was prepped and draped in the usual sterile ophthalmic fashion.   A 1.0 millimeter clear-corneal paracentesis was made at the 10:30 position. 0.5 ml of preservative-free 1% lidocaine with epinephrine was injected into the anterior chamber.   The anterior chamber was filled with viscoelastic.  A 2.4 millimeter keratome was used to make a near-clear corneal incision at the 8:00 position.  A curvilinear capsulorrhexis was made with a cystotome and capsulorrhexis forceps.  Balanced salt solution was used to hydrodissect and hydrodelineate the nucleus.   Phacoemulsification was then used in stop and chop fashion to remove the lens nucleus and epinucleus.  The remaining cortex was then removed using the irrigation and aspiration handpiece. Viscoelastic was then placed into the capsular bag to distend it for lens placement.  A lens was then injected into the capsular bag.  The remaining viscoelastic was aspirated.   Wounds were hydrated with balanced salt solution.  The anterior chamber was inflated to a physiologic pressure with balanced salt solution.   Intracameral vigamox 0.1 mL undiluted was injected into the eye and a drop placed onto the ocular surface.  No wound leaks were noted.  The patient was taken to the recovery room in stable condition without complications of anesthesia or surgery  Benay Pillow 02/28/2022, 11:58 AM

## 2022-03-01 ENCOUNTER — Encounter: Payer: Self-pay | Admitting: Ophthalmology

## 2022-03-01 ENCOUNTER — Encounter: Payer: Medicare Other | Admitting: Physical Therapy

## 2022-03-01 ENCOUNTER — Encounter: Payer: BC Managed Care – PPO | Admitting: Physical Therapy

## 2022-03-03 ENCOUNTER — Encounter: Payer: Medicare Other | Admitting: Physical Therapy

## 2022-03-03 ENCOUNTER — Encounter: Payer: BC Managed Care – PPO | Admitting: Physical Therapy

## 2022-03-08 ENCOUNTER — Encounter: Payer: Self-pay | Admitting: Physical Therapy

## 2022-03-08 ENCOUNTER — Ambulatory Visit: Payer: Medicare Other | Attending: Internal Medicine | Admitting: Physical Therapy

## 2022-03-08 DIAGNOSIS — R269 Unspecified abnormalities of gait and mobility: Secondary | ICD-10-CM | POA: Insufficient documentation

## 2022-03-08 DIAGNOSIS — Z89611 Acquired absence of right leg above knee: Secondary | ICD-10-CM | POA: Insufficient documentation

## 2022-03-08 DIAGNOSIS — M6281 Muscle weakness (generalized): Secondary | ICD-10-CM | POA: Diagnosis present

## 2022-03-08 DIAGNOSIS — R293 Abnormal posture: Secondary | ICD-10-CM | POA: Insufficient documentation

## 2022-03-09 NOTE — Therapy (Signed)
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   Hypertension    PONV (postoperative nausea and vomiting)    Seasonal allergies    Sleep apnea    does not uses CPAP machine anymore    Wheelchair dependent    able to self transfer   Past Surgical History:  Procedure Laterality Date   APPENDECTOMY     CESAREAN SECTION     CHOLECYSTECTOMY     DILATION AND CURETTAGE OF UTERUS     ESOPHAGOGASTRODUODENOSCOPY N/A 02/22/2019   Procedure: ESOPHAGOGASTRODUODENOSCOPY (EGD);  Surgeon: Toledo, Benay Pike, MD;  Location: [facility];  Service: Gastroenterology;  Laterality: N/A;   FLEXIBLE SIGMOIDOSCOPY N/A 02/22/2019   Procedure: FLEXIBLE SIGMOIDOSCOPY;  Surgeon: Toledo, Benay Pike, MD;  Location: [facility];  Service: Gastroenterology;  Laterality: N/A;   HARDWARE REMOVAL Right 11/12/2019   Procedure: Right ankle hardware removal;  Surgeon: Hessie Knows, MD;  Location: [facility];  Service: Orthopedics;  Laterality: Right;   HARDWARE REMOVAL Right 04/17/2020   Procedure: HARDWARE REMOVAL FROM RIGHT ANKLE AND RIGHT KNEE; IMPLANT OF CEMENT SPACERS IN RIGHT KNEE;  Surgeon: Dereck Leep, MD;  Location: [facility];  Service: Orthopedics;  Laterality: Right;   JOINT REPLACEMENT     LEG AMPUTATION ABOVE KNEE Right 12/02/2020   [facility]   ORIF ANKLE FRACTURE Right 03/14/2019   Procedure: OPEN REDUCTION INTERNAL FIXATION (ORIF) ANKLE FRACTURE, MEDIAL MALLEOLUS;  Surgeon: Hessie Knows, MD;   Location: [facility];  Service: Orthopedics;  Laterality: Right;   ORIF ANKLE FRACTURE Right 02/08/2019   Procedure: OPEN REDUCTION INTERNAL FIXATION (ORIF) ANKLE FRACTURE, post malleolus;  Surgeon: Hessie Knows, MD;  Location: [facility];  Service: Orthopedics;  Laterality: Right;   PARTIAL HYSTERECTOMY  1993   REPLACEMENT TOTAL KNEE Bilateral 2008   2009   SCAR DEBRIDEMENT OF TOTAL KNEE  01/2020   SYNDESMOSIS REPAIR Right 02/08/2019   Procedure: SYNDESMOSIS REPAIR;  Surgeon: Hessie Knows, MD;  Location: [facility];  Service: Orthopedics;  Laterality: Right;   TEE WITHOUT CARDIOVERSION N/A 02/26/2019   Procedure: TRANSESOPHAGEAL ECHOCARDIOGRAM (TEE);  Surgeon: Corey Skains, MD;  Location: [facility];  Service: Cardiovascular;  Laterality: N/A;   TOTAL KNEE REVISION WITH SCAR DEBRIDEMENT/PATELLA REVISION WITH POLY EXCHANGE Right 01/24/2020   Procedure: TOTAL KNEE REVISION WITH SCAR DEBRIDEMENT/PATELLA REVISION WITH POLY EXCHANGE;  Surgeon: Dereck Leep, MD;  Location: [facility];  Service: Orthopedics;  Laterality: Right;   TUMOR REMOVAL     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   Patient Active Problem List   Diagnosis Date Noted   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[place]) 04/17/2020   Anxiety 04/17/2020   History of GI diverticular bleed 04/17/2020   Chest pain 04/17/2020   PAD (peripheral artery disease) ([place]) 04/05/2020   Ulcer of right leg ([place]) 04/05/2020   COPD (  chronic obstructive pulmonary disease) ([place]) 04/05/2020   GERD (gastroesophageal reflux disease) 04/05/2020   Abscess of right knee 01/22/2020   Pressure injury of skin 02/28/2019   SOB (shortness of breath)    Gastrointestinal hemorrhage    Sepsis (HCC)    Symptomatic anemia 02/20/2019   Trimalleolar fracture of ankle, closed, right, initial encounter 02/07/2019   Multiple lung nodules 03/31/2014   Extrinsic asthma 03/27/2014   OSA on CPAP 03/27/2014    PCP: Rusty Aus, MD  REFERRING PROVIDER: Rusty Aus,  MD  REFERRING DIAG: Above knee amputation of right lower extremity  THERAPY DIAG:  S/P AKA (above knee amputation) unilateral, right (HCC)  Muscle weakness (generalized)  Gait difficulty  Abnormal posture  Rationale for Evaluation and Treatment Rehabilitation  ONSET DATE: 12/02/20  SUBJECTIVE:  EVALUATION  PERTINENT HISTORY: Patient Profile:   Jasmine Morrow is a 77 y.o. female Chief Complaint  Patient presents with  Visit Follow Up  Doing well.   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 Cardiac cath 10/2011 showed completely normal coronary arteries, however she did have mild left ventricular dysfunction with anterior wall motion abnormality. Left ventricular EF was 51%.  History of chickenpox  Hyperlipidemia  patient states she has never been diagnosed with this  Hypertension  Knee joint replacement by other means  Macular degeneration  Major depressive disorder, recurrent, mild (CMS-HCC)  Medicare annual wellness visit, initial 08/09/2019  7/21  Multiple thyroid nodules  Normal coronary arteries 09/06/2013  Normal coronary anatomy by cardiac catheterization 10/12/11  Osteoarthritis  Ovarian cyst  PONV (postoperative nausea and vomiting)  nausea  Postoperative urinary retention 12/08/2020  Rash on lips 12/21/2020  Seasonal allergies  Sinusitis, unspecified  Sleep apnea  On  CPAP.  Slow transit constipation 12/10/2020  Trimalleolar fracture of ankle, closed, right, initial encounter 88/50/2774  Complicated by staph infection 1/21, 4 weeks IV Ancef, [facility]  Ulcer   Past Surgical History:  Procedure Laterality Date  SALPINGO OOPHORECTOMY Bilateral 1993  Right total knee arthroplasty 10/11/2006  Dr Marry Guan  Left total knee arthroplasty 10/12/2007  Dr Marry Guan  COLONOSCOPY 07/30/2012  internal hemorrhoids, diverticulosis  ANKLE ARTHODESIS W/ ARTHROSCOPY Right 02/08/2019  03/14/2019 second surgery  COLONOSCOPY 02/22/2019  Blood in entire colon/Diverticulosis - Presumed diverticular bleed. No repeat recommended per TKT.  EGD 02/22/2019  Gastritis/gastric ulcers/Hiatal hernia/Otherwise normal - no repeat recommended per TKT.  ORIF ANKLE FRACTURE Right 03/14/2019  Dr. Rudene Christians  REMOVAL HARDWARE ANKLE FOOT/TOES Right 11/12/2019  Dr. Rudene Christians  Right knee arthrotomy, irrigation and debridement of the right knee, polyethylene exchange 01/24/2020  Dr Marry Guan  Removal of hardware (plates and screws) from the right ankle with irrigation, debridement, and placement of Stimulan antibiotic beads 04/17/2020  Dr Marry Guan  Right knee arthrotomy, extensive irrigation debridement, removal of right total knee implants, and placement of antibiotic impregnated polymethylmethacrylate cement spacer 04/17/2020  Dr Marry Guan  AMPUTATION LEG ABOVE KNEE AKA Right 12/02/2020  Procedure: AMPUTATION, THIGH, THROUGH FEMUR, ANY  LEVEL; Surgeon: Lyndle Herrlich, MD; Location: [facility]; Service: Orthopedics; Laterality: Right;  TRANSFER ADJACENT TISSUE LEG Right 12/02/2020  Procedure: ADJACENT TISSUE TRANSFER OR REARRANGEMENT, LEG; DEFECT 10 SQ CM OR LESS; Surgeon: Lyndle Herrlich, MD; Location: [facility]; Service: Orthopedics; Laterality: Right;  ASPIRATION/INJECTION MAJOR JOINT/BURSA KNEE Left 12/02/2020  Procedure: ARTHROCENTESIS, ASPIRATION AND/OR INJECTION, MAJOR JOINT OR BURSA, KNEE;  WITHOUT ULTRASOUND GUIDANCE; Surgeon: Lyndle Herrlich, MD; Location: [facility]; Service: Orthopedics; Laterality: Left;  REMOVAL KNEE PROSTHESIS W/POSSIBLE PLACEMENT SPACER Left 01/18/2021  Procedure: REMOVAL OF PROSTHESIS, INCLUDING TOTAL KNEE PROSTHESIS, METHYLMETHACRYLATE WITH OR WITHOUT INSERTION OF SPACER, KNEE; Surgeon: Ihor Dow, MD; Location: [facility]; Service: Orthopedics; Laterality: Left;  INSERTION NON-BIODEGRADABLE DRUG DELIVERY IMPLANT Left 01/18/2021  Procedure: INSERTION, KNEE, bioresorbable, biodegradable, NON-BIODEGRADABLE DRUG DELIVERY IMPLANT; Surgeon: Ihor Dow, MD; Location: [facility]; Service: Orthopedics; Laterality: Left;  ABOVE KNEE LEG AMPUTATION  12/02/2020  APPENDECTOMY Est 1968  CESAREAN SECTION  CHOLECYSTECTOMY  FRACTURE SURGERY 1/21  HYSTERECTOMY partial  JOINT REPLACEMENT 2008 & 2009  TUBAL LIGATION 3/79   ALLERGIES: Allergies  Allergen Reactions  Singulair [Montelukast] Other (See Comments)  Gland swelling  Sulfa (Sulfonamide Antibiotics) Swelling  Oxycodone Dizziness and Other (See Comments)  Vancomycin Analogues Other (See Comments)  Ringing in ears  Avinza [Morphine] Itching  Darvocet A500 [Propoxyphene N-Acetaminophen] Nausea  Nickel Rash  Ultracet [Tramadol-Acetaminophen] Rash  Vicodin [Hydrocodone-Acetaminophen] Vomiting  Vioxx [Rofecoxib] Other (See Comments)  GI   CURRENT MEDICATIONS: Current Outpatient Medications: acetaminophen (TYLENOL) 500 MG tablet, Take 1,000 mg by mouth 3 (three) times a day Twice daily per patient., PRN Not Currently Taking aspirin 81 MG EC tablet, Take 1 tablet (81 mg total) by mouth at bedtime, Taking calcium carbonate-vitamin D3 (CALTRATE 600+D) 600 mg-10 mcg (400 unit) tablet, Take 0.5 tablets by mouth 2 (two) times daily, Taking cetirizine (ZYRTEC) 10 mg capsule, Take 1 capsule (10 mg total) by mouth once daily, Taking escitalopram oxalate (LEXAPRO) 10 MG tablet,  Take 1 tablet (10 mg total) by mouth once daily, Taking estradioL (ESTRACE) 0.01 % (0.1 mg/gram) vaginal cream, Place 2 g vaginally twice a week, Taking folic acid/multivit,iron,miner (MULTIVIT-IRON-MIN-FOLIC ACID ORAL), Take 1 tablet by mouth once daily, Taking gabapentin (NEURONTIN) 300 MG capsule, Take 1 capsule (300 mg total) by mouth 3 (three) times daily Phantom limb pain, Taking melatonin 3 mg tablet, Take 1 tablet (3 mg total) by mouth at bedtime, Taking multivitamin with iron (COMPLETE MULTIVITAMIN-MINERAL) tablet, Take 1 tablet by mouth, Taking multivitamin with minerals, EYE, (PRESERVISION AREDS 2) soft gel capsule, Take 1 capsule by mouth 2 (two) times daily, Taking nystatin (MYCOSTATIN) 100,000 unit/gram powder, Apply small amount topically twice daily for rash under breasts, PRN Not Currently Taking pantoprazole (PROTONIX) 40 MG DR tablet, Take 1 tablet (40 mg total) by mouth 2 (two) times daily. Please call to schedule an appt. Thanks!, Taking polyethylene glycol (MIRALAX) powder, Take 17 g by mouth once daily Mix in 4-8ounces of fluid prior to taking., Taking sennosides-docusate (SENOKOT-S) 8.6-50 mg tablet, Take 1 tablet by mouth 2 (two) times daily For constipation, Taking lactose-reduced food (ENSURE PLUS ORAL), Take 237 mLs by mouth 2 (two) times daily With breakfast and dinner  HPI   CLINICAL SUMMARY:  Patient post right AKA and working with prosthesis. 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 Able to do housework, improving  PAIN:  Are you having pain? Yes: NPRS scale: 5/10 Pain location: L knee Pain description:  aching Aggravating factors: walking Relieving factors: rest  PRECAUTIONS: None  WEIGHT BEARING RESTRICTIONS No  FALLS:  Has patient fallen in last 6 months? No  LIVING ENVIRONMENT: Lives with: lives with their spouse Lives in: House/apartment Stairs: No Has following equipment at home: Gilford Rile - 4 wheeled and Wheelchair  (manual)  OCCUPATION: retired  PLOF: Requires assistive device for independence  PATIENT GOALS  improve standing tolerance/ pull up pants/ ambulate with [place] safely.     OBJECTIVE:   PATIENT SURVEYS:  FOTO initial 46/ goal 57.   11/2: 50  COGNITION:  Overall cognitive status: Within functional limits for tasks assessed     SENSATION: WFL  POSTURE: rounded shoulders, forward head, flexed trunk , and weight shift left  PALPATION: No tenderness along R distal residual limb.  Discussed phantom limb sensation.    LOWER EXTREMITY ROM:  B LE AROM WFL except L knee extension secondary to articulating spacer.  Did not assess L/R hip extension at this time.    LOWER EXTREMITY MMT:  MMT Right eval Left eval  Hip flexion 4/5 4/5  Hip extension    Hip abduction 4+/5 4+/5  Hip adduction 4+/5 4+/5  Hip internal rotation    Hip external rotation    Knee flexion N/A 4+/5  Knee extension N/A 4+/5  Ankle dorsiflexion  5/5  Ankle plantarflexion    Ankle inversion    Ankle eversion     (Blank rows = not tested)  GAIT: Distance walked: in gym/ //-bars Assistive device utilized: Environmental consultant - 2 wheeled Level of assistance: CGA Comments: Cuing for posture correction with mirror feedback.  Flexed posture/ heavy UE assist.     TODAY'S TREATMENT:  03/08/22  Subjective:  Pt. S/p cataract surgery last week and everything went well.  Pt. Is scheduled to have other eye completed next week.  Pt. States she hasn't walked since last PT visit due to surgery.  Pt. Continues to use a shrinker for residual limb to manage swelling. Pts. Prosthetic leg fit really good today with improvement in residual limb swelling.    Objective:    There.ex.:      Nustep L5 10+ min. With B UE/LE.  Use of R toe clip to improve placement of R foot.  No UE assist for last minute.  Fatigue noted.     Standing wt. Shifting in //-bars with no UE assist/ upright posture correction with shoulder width BOS.  Added shoulder  flexion with mirror feedback.  CGA for safety due to occasional posterior loss of balance with //-bars to correct.    Forward/ backwards stepping in //-bars with cuing to correct posture.  Heavy UE assist on //-bars required for safety.        Standing hip abduction 15x on L/R in //-bars.     Gait training:    Amb. In gym with use of RW and focus on recip. Step pattern/ maintaining proper BOS.  Pt. Maintained consistent  step pattern/ heel strike and good knee mechanism control.  No loss of balance or episodes of knee buckling.     Ascend/ descend stairs 2x with step to gait and no knee buckling issues today.  Heavy UE assist and focus on R knee mechanism control and preventing toe clipping on step.  Mod. Ind./ SBA with no PT assist at stairs.     PATIENT EDUCATION:  Education details: HEP/ gait and prosthetic training.  Person educated: Patient and Spouse Education method: Explanation, Demonstration, and Verbal cues Education comprehension: verbalized  understanding, returned demonstration, and tactile cues required   HOME EXERCISE PROGRAM: Prone position hip stretches/ Standing marching at kitchen counter with w/c behind patient.      ASSESSMENT:  CLINICAL IMPRESSION: Pt. ambulates around PT clinic with use of prosthetic leg/ RW with consistent recip. With no LOB.  Pt. Did well with transfer from w/c to surgical chair last week for cataract surgery.  Moderate LE/ generalized muscle fatigue noted today with sit to stands/ standing hip abduction.   Pt. Encouraged to walk more at home with daughter in law/ granddaughter assistance this week for safety prior to 2nd cataract surgery.  Pt. Will benefit from skilled PT services to improve standing tolerance/ gait independence and ADL.    OBJECTIVE IMPAIRMENTS Abnormal gait, decreased activity tolerance, decreased balance, decreased endurance, decreased mobility, difficulty walking, decreased ROM, decreased strength, decreased safety awareness,  impaired flexibility, improper body mechanics, prosthetic dependency , and pain.   ACTIVITY LIMITATIONS carrying, lifting, standing, squatting, stairs, transfers, bed mobility, bathing, toileting, dressing, hygiene/grooming, and locomotion level  PARTICIPATION LIMITATIONS: cleaning, laundry, driving, shopping, community activity, and yard work  [place] Past/current experiences are also affecting patient's functional outcome.   REHAB POTENTIAL: Good  CLINICAL DECISION MAKING: Evolving/moderate complexity  EVALUATION COMPLEXITY: High   GOALS: Goals reviewed with patient? Yes  SHORT TERM GOALS: Target date: 10/07/21 Pt. Able to don/doff R prosthetic leg independently to improve ADL/standing tolerance.  Baseline:  min. A to don/ doff prosthetic leg Goal status: Goal met   LONG TERM GOALS: Target date: 03/29/22  Pt. Will increase FOTO to 54 to improve functional mobility.  Baseline: initial FOTO 46.  11/2: 50 Goal status: Partially met  2.  Pt. Able to tolerate standing 10 minutes while pulling up pants with mod. I safely while wearing prosthetic leg to improve daily activities.  Baseline:  limited standing tolerance Goal status: Not met  3.  Pt. Will ambulate 200 feet with use of SPC and mod. I to promote safety with household tasks/ walking into bathroom.   Baseline:  amb. With RW and min. A for safety.  11/2:   Goal status: Not met   PLAN: PT FREQUENCY: 2x/week  PT DURATION: 8 weeks  PLANNED INTERVENTIONS: Therapeutic exercises, Therapeutic activity, Neuromuscular re-education, Balance training, Gait training, Patient/Family education, Self Care, Joint mobilization, Prosthetic training, DME instructions, and Manual therapy  PLAN FOR NEXT SESSION:  Progress gait/ discuss pts. Cataract surgery and ability to get on surgical chair.   Pura Spice, PT, DPT # [phone] 03/09/2022, 11:29 AM

## 2022-03-09 NOTE — Discharge Instructions (Signed)

## 2022-03-10 ENCOUNTER — Ambulatory Visit: Payer: Medicare Other | Admitting: Physical Therapy

## 2022-03-10 ENCOUNTER — Encounter: Payer: Self-pay | Admitting: Physical Therapy

## 2022-03-10 DIAGNOSIS — Z89611 Acquired absence of right leg above knee: Secondary | ICD-10-CM

## 2022-03-10 DIAGNOSIS — R293 Abnormal posture: Secondary | ICD-10-CM

## 2022-03-10 DIAGNOSIS — M6281 Muscle weakness (generalized): Secondary | ICD-10-CM

## 2022-03-10 DIAGNOSIS — R269 Unspecified abnormalities of gait and mobility: Secondary | ICD-10-CM

## 2022-03-10 NOTE — Therapy (Signed)
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   Hypertension    PONV (postoperative nausea and vomiting)    Seasonal allergies    Sleep apnea    does not uses CPAP machine anymore    Wheelchair dependent    able to self transfer   Past Surgical History:  Procedure Laterality Date   APPENDECTOMY     CATARACT EXTRACTION W/PHACO Right 02/28/2022   Procedure: CATARACT EXTRACTION PHACO AND INTRAOCULAR LENS PLACEMENT (IOC) RIGHT  4.82  00:30.9;  Surgeon: Eulogio Bear, MD;  Location: [facility];  Service: Ophthalmology;  Laterality: Right;  sleep apnea   CESAREAN SECTION     CHOLECYSTECTOMY     DILATION AND CURETTAGE OF UTERUS     ESOPHAGOGASTRODUODENOSCOPY N/A 02/22/2019   Procedure: ESOPHAGOGASTRODUODENOSCOPY (EGD);  Surgeon: Toledo, Benay Pike, MD;  Location: [facility];  Service: Gastroenterology;  Laterality: N/A;   FLEXIBLE SIGMOIDOSCOPY N/A 02/22/2019   Procedure: FLEXIBLE SIGMOIDOSCOPY;  Surgeon: Toledo, Benay Pike, MD;  Location: [facility];  Service: Gastroenterology;  Laterality: N/A;   HARDWARE REMOVAL Right 11/12/2019   Procedure: Right ankle hardware removal;  Surgeon: Hessie Knows, MD;  Location: [facility];  Service: Orthopedics;  Laterality: Right;   HARDWARE REMOVAL Right 04/17/2020   Procedure: HARDWARE REMOVAL FROM RIGHT ANKLE AND RIGHT KNEE; IMPLANT OF CEMENT SPACERS IN RIGHT KNEE;  Surgeon: Dereck Leep, MD;  Location: [facility];  Service:  Orthopedics;  Laterality: Right;   JOINT REPLACEMENT     LEG AMPUTATION ABOVE KNEE Right 12/02/2020   [facility]   ORIF ANKLE FRACTURE Right 03/14/2019   Procedure: OPEN REDUCTION INTERNAL FIXATION (ORIF) ANKLE FRACTURE, MEDIAL MALLEOLUS;  Surgeon: Hessie Knows, MD;  Location: [facility];  Service: Orthopedics;  Laterality: Right;   ORIF ANKLE FRACTURE Right 02/08/2019   Procedure: OPEN REDUCTION INTERNAL FIXATION (ORIF) ANKLE FRACTURE, post malleolus;  Surgeon: Hessie Knows, MD;  Location: [facility];  Service: Orthopedics;  Laterality: Right;   PARTIAL HYSTERECTOMY  1993   REPLACEMENT TOTAL KNEE Bilateral 2008   2009   SCAR DEBRIDEMENT OF TOTAL KNEE  01/2020   SYNDESMOSIS REPAIR Right 02/08/2019   Procedure: SYNDESMOSIS REPAIR;  Surgeon: Hessie Knows, MD;  Location: [facility];  Service: Orthopedics;  Laterality: Right;   TEE WITHOUT CARDIOVERSION N/A 02/26/2019   Procedure: TRANSESOPHAGEAL ECHOCARDIOGRAM (TEE);  Surgeon: Corey Skains, MD;  Location: [facility];  Service: Cardiovascular;  Laterality: N/A;   TOTAL KNEE REVISION WITH SCAR DEBRIDEMENT/PATELLA REVISION WITH POLY EXCHANGE Right 01/24/2020   Procedure: TOTAL KNEE REVISION WITH SCAR DEBRIDEMENT/PATELLA REVISION WITH POLY EXCHANGE;  Surgeon: Dereck Leep, MD;  Location: [facility];  Service: Orthopedics;  Laterality: Right;   TUMOR REMOVAL     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   Patient Active Problem List   Diagnosis Date Noted  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[place]) 04/17/2020   Anxiety 04/17/2020   History of GI diverticular bleed 04/17/2020   Chest pain 04/17/2020   PAD (peripheral artery disease) ([place]) 04/05/2020   Ulcer of right leg ([place]) 04/05/2020   COPD (chronic obstructive pulmonary disease) ([place]) 04/05/2020   GERD (gastroesophageal reflux disease) 04/05/2020   Abscess of right knee 01/22/2020   Pressure injury of skin 02/28/2019   SOB (shortness of breath)    Gastrointestinal hemorrhage    Sepsis (HCC)     Symptomatic anemia 02/20/2019   Trimalleolar fracture of ankle, closed, right, initial encounter 02/07/2019   Multiple lung nodules 03/31/2014   Extrinsic asthma 03/27/2014   OSA on CPAP 03/27/2014    PCP: Rusty Aus, MD  REFERRING PROVIDER: Rusty Aus, MD  REFERRING DIAG: Above knee amputation of right lower extremity  THERAPY DIAG:  S/P AKA (above knee amputation) unilateral, right (HCC)  Muscle weakness (generalized)  Gait difficulty  Abnormal posture  Rationale for Evaluation and Treatment Rehabilitation  ONSET DATE: 12/02/20  SUBJECTIVE:  EVALUATION  PERTINENT HISTORY: Patient Profile:   Jasmine Morrow is a 77 y.o. female Chief Complaint  Patient presents with  Visit Follow Up  Doing well.   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 Cardiac cath 10/2011 showed completely normal coronary arteries, however she did have mild left ventricular dysfunction with anterior wall motion abnormality. Left ventricular EF was 51%.  History of chickenpox  Hyperlipidemia  patient states she has never been diagnosed with this  Hypertension  Knee joint replacement by other means  Macular degeneration  Major depressive disorder, recurrent, mild (CMS-HCC)  Medicare annual wellness visit, initial 08/09/2019  7/21  Multiple thyroid nodules  Normal coronary arteries 09/06/2013  Normal  coronary anatomy by cardiac catheterization 10/12/11  Osteoarthritis  Ovarian cyst  PONV (postoperative nausea and vomiting)  nausea  Postoperative urinary retention 12/08/2020  Rash on lips 12/21/2020  Seasonal allergies  Sinusitis, unspecified  Sleep apnea  On CPAP.  Slow transit constipation 12/10/2020  Trimalleolar fracture of ankle, closed, right, initial encounter 03/88/8280  Complicated by staph infection 1/21, 4 weeks IV Ancef, [facility]  Ulcer   Past Surgical History:  Procedure Laterality Date  SALPINGO OOPHORECTOMY Bilateral 1993  Right total knee arthroplasty 10/11/2006  Dr Marry Guan  Left total knee arthroplasty 10/12/2007  Dr Marry Guan  COLONOSCOPY 07/30/2012  internal hemorrhoids, diverticulosis  ANKLE ARTHODESIS W/ ARTHROSCOPY Right 02/08/2019  03/14/2019 second surgery  COLONOSCOPY 02/22/2019  Blood in entire colon/Diverticulosis - Presumed diverticular bleed. No repeat recommended per TKT.  EGD 02/22/2019  Gastritis/gastric ulcers/Hiatal hernia/Otherwise normal - no repeat recommended per TKT.  ORIF ANKLE FRACTURE Right 03/14/2019  Dr. Rudene Christians  REMOVAL HARDWARE ANKLE FOOT/TOES Right 11/12/2019  Dr. Rudene Christians  Right knee arthrotomy, irrigation and debridement of the right knee, polyethylene exchange 01/24/2020  Dr Marry Guan  Removal of hardware (plates and screws) from the right ankle with irrigation, debridement, and placement of Stimulan antibiotic beads 04/17/2020  Dr Marry Guan  Right knee arthrotomy, extensive irrigation debridement, removal of right total knee implants, and placement of antibiotic impregnated polymethylmethacrylate cement spacer 04/17/2020  Dr Marry Guan  AMPUTATION LEG ABOVE KNEE AKA Right 12/02/2020  Procedure: AMPUTATION, THIGH, THROUGH FEMUR, ANY LEVEL; Surgeon: Lyndle Herrlich, MD; Location: [facility]; Service: Orthopedics; Laterality: Right;  TRANSFER ADJACENT TISSUE LEG Right 12/02/2020  Procedure: ADJACENT TISSUE TRANSFER OR REARRANGEMENT, LEG;  DEFECT 10 SQ CM OR LESS; Surgeon: Lyndle Herrlich, MD; Location: [facility]; Service: Orthopedics; Laterality: Right;  ASPIRATION/INJECTION MAJOR JOINT/BURSA KNEE Left 12/02/2020  Procedure: ARTHROCENTESIS, ASPIRATION AND/OR INJECTION, MAJOR JOINT OR BURSA, KNEE; WITHOUT ULTRASOUND GUIDANCE; Surgeon: Lyndle Herrlich, MD; Location: [facility]; Service: Orthopedics; Laterality: Left;  REMOVAL KNEE PROSTHESIS W/POSSIBLE PLACEMENT SPACER Left 01/18/2021  Procedure: REMOVAL OF PROSTHESIS, INCLUDING TOTAL KNEE PROSTHESIS, METHYLMETHACRYLATE WITH OR WITHOUT INSERTION OF SPACER, KNEE; Surgeon: Ihor Dow, MD; Location: [facility]; Service: Orthopedics; Laterality: Left;  INSERTION NON-BIODEGRADABLE DRUG DELIVERY IMPLANT Left 01/18/2021  Procedure: INSERTION, KNEE, bioresorbable, biodegradable, NON-BIODEGRADABLE DRUG DELIVERY IMPLANT; Surgeon: Ihor Dow, MD; Location: [facility]; Service: Orthopedics; Laterality: Left;  ABOVE KNEE LEG AMPUTATION  12/02/2020  APPENDECTOMY Est 1968  CESAREAN SECTION  CHOLECYSTECTOMY  FRACTURE SURGERY 1/21  HYSTERECTOMY partial  JOINT REPLACEMENT 2008 & 2009  TUBAL LIGATION 3/79   ALLERGIES: Allergies  Allergen Reactions  Singulair [Montelukast] Other (See Comments)  Gland swelling  Sulfa (Sulfonamide Antibiotics) Swelling  Oxycodone Dizziness and Other (See Comments)  Vancomycin Analogues Other (See Comments)  Ringing in ears  Avinza [Morphine] Itching  Darvocet A500 [Propoxyphene N-Acetaminophen] Nausea  Nickel Rash  Ultracet [Tramadol-Acetaminophen] Rash  Vicodin [Hydrocodone-Acetaminophen] Vomiting  Vioxx [Rofecoxib] Other (See Comments)  GI   CURRENT MEDICATIONS: Current Outpatient Medications: acetaminophen (TYLENOL) 500 MG tablet, Take 1,000 mg by mouth 3 (three) times a day Twice daily per patient., PRN Not Currently Taking aspirin 81 MG EC tablet, Take 1 tablet (81 mg total) by mouth at bedtime,  Taking calcium carbonate-vitamin D3 (CALTRATE 600+D) 600 mg-10 mcg (400 unit) tablet, Take 0.5 tablets by mouth 2 (two) times daily, Taking cetirizine (ZYRTEC) 10 mg capsule, Take 1 capsule (10 mg total) by mouth once daily, Taking escitalopram oxalate (LEXAPRO) 10 MG tablet, Take 1 tablet (10 mg total) by mouth once daily, Taking estradioL (ESTRACE) 0.01 % (0.1 mg/gram) vaginal cream, Place 2 g vaginally twice a week, Taking folic acid/multivit,iron,miner (MULTIVIT-IRON-MIN-FOLIC ACID ORAL), Take 1 tablet by mouth once daily, Taking gabapentin (NEURONTIN) 300 MG capsule, Take 1 capsule (300 mg total) by mouth 3 (three) times daily Phantom limb pain, Taking melatonin 3 mg tablet, Take 1 tablet (3 mg total) by mouth at bedtime, Taking multivitamin with iron (COMPLETE MULTIVITAMIN-MINERAL) tablet, Take 1 tablet by mouth, Taking multivitamin with minerals, EYE, (PRESERVISION AREDS 2) soft gel capsule, Take 1 capsule by mouth 2 (two) times daily, Taking nystatin (MYCOSTATIN) 100,000 unit/gram powder, Apply small amount topically twice daily for rash under breasts, PRN Not Currently Taking pantoprazole (PROTONIX) 40 MG DR tablet, Take 1 tablet (40 mg total) by mouth 2 (two) times daily. Please call to schedule an appt. Thanks!, Taking polyethylene glycol (MIRALAX) powder, Take 17 g by mouth once daily Mix in 4-8ounces of fluid prior to taking., Taking sennosides-docusate (SENOKOT-S) 8.6-50 mg tablet, Take 1 tablet by mouth 2 (two) times daily For constipation, Taking lactose-reduced food (ENSURE PLUS ORAL), Take 237 mLs by mouth 2 (two) times daily With breakfast and dinner  HPI   CLINICAL SUMMARY:  Patient post right AKA and working with prosthesis. 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 Able to do housework, improving  PAIN:  Are you having pain? Yes: NPRS scale: 5/10 Pain location: L knee Pain description: aching Aggravating factors: walking Relieving factors:  rest  PRECAUTIONS: None  WEIGHT BEARING RESTRICTIONS No  FALLS:  Has patient fallen in last 6 months? No  LIVING ENVIRONMENT: Lives with: lives with their spouse Lives in: House/apartment Stairs: No Has following equipment at home: Gilford Rile - 4 wheeled and Wheelchair (manual)  OCCUPATION: retired  PLOF: Requires assistive device for independence  PATIENT GOALS  improve standing tolerance/ pull up pants/ ambulate with [place] safely.     OBJECTIVE:   PATIENT SURVEYS:  FOTO initial 46/ goal 79.   11/2: 50  COGNITION:  Overall cognitive status: Within functional limits for tasks assessed     SENSATION: WFL  POSTURE: rounded shoulders, forward head, flexed trunk , and weight shift left  PALPATION: No tenderness along R distal residual limb.  Discussed phantom limb sensation.    LOWER EXTREMITY ROM:  B LE AROM WFL except L knee extension secondary to articulating spacer.  Did not assess L/R hip extension at this time.    LOWER EXTREMITY MMT:  MMT Right eval Left eval  Hip flexion 4/5 4/5  Hip extension    Hip abduction 4+/5 4+/5  Hip adduction 4+/5 4+/5  Hip internal rotation    Hip external rotation    Knee flexion N/A 4+/5  Knee extension N/A 4+/5  Ankle dorsiflexion  5/5  Ankle plantarflexion    Ankle inversion    Ankle eversion     (Blank rows = not tested)  GAIT: Distance walked: in gym/ //-bars Assistive device utilized: Environmental consultant - 2 wheeled Level of assistance: CGA Comments: Cuing for posture correction with mirror feedback.  Flexed posture/ heavy UE assist.     TODAY'S TREATMENT:  03/10/22  Subjective:  Pt. Reports no new complaints.  Pt. Scheduled for cataract surgery of 2nd eye next week.  No pain reported.  Pt. Remains compliant with use of shrinker and prosthetic fitting well.    Objective:    There.ex.:      Nustep L5 10 min. With B UE/LE.  Use of R toe clip to improve placement of R foot.      Gait training:    Amb. In gym/ hallway with  use of RW and focus on recip. Step pattern/ maintaining proper BOS.  Pt. Maintained consistent  step pattern/ heel strike and good knee mechanism control.  No loss of balance or episodes of knee buckling.   95 feet before seated rest break.    Ascend/ descend stairs 2x with step to gait and no knee buckling issues today.  Heavy UE assist and focus on R knee mechanism control and preventing toe clipping on step.  Mod. Ind./ SBA with no PT assist at stairs.  Standing L/R 1st step touches with B UE assist on handrails.     PATIENT EDUCATION:  Education details: HEP/ gait and prosthetic training.  Person educated: Patient and Spouse Education method: Explanation, Demonstration, and Verbal cues Education comprehension: verbalized understanding, returned demonstration, and tactile cues required   HOME EXERCISE PROGRAM: Prone position hip stretches/ Standing marching at kitchen counter with w/c behind patient.      ASSESSMENT:  CLINICAL IMPRESSION: Pt. ambulates around PT clinic with use of prosthetic leg/ RW with consistent recip. With no LOB.  Moderate LE/  generalized muscle fatigue noted today with sit to stands/ recip. Step touches.  Pt. Encouraged to walk more at home with daughter in law/ granddaughter assistance this week for safety prior to 2nd cataract surgery.  Pt. Will benefit from skilled PT services to improve standing tolerance/ gait independence and ADL.    OBJECTIVE IMPAIRMENTS Abnormal gait, decreased activity tolerance, decreased balance, decreased endurance, decreased mobility, difficulty walking, decreased ROM, decreased strength, decreased safety awareness, impaired flexibility, improper body mechanics, prosthetic dependency , and pain.   ACTIVITY LIMITATIONS carrying, lifting, standing, squatting, stairs, transfers, bed mobility, bathing, toileting, dressing, hygiene/grooming, and locomotion level  PARTICIPATION LIMITATIONS: cleaning, laundry, driving, shopping, community  activity, and yard work  [place] Past/current experiences are also affecting patient's functional outcome.   REHAB POTENTIAL: Good  CLINICAL DECISION MAKING: Evolving/moderate complexity  EVALUATION COMPLEXITY: High   GOALS: Goals reviewed with patient? Yes  SHORT TERM GOALS: Target date: 10/07/21 Pt. Able to don/doff R prosthetic leg independently to improve ADL/standing tolerance.  Baseline:  min. A to don/ doff prosthetic leg Goal status: Goal met   LONG TERM GOALS: Target date: 03/29/22  Pt. Will increase FOTO to 54 to improve functional mobility.  Baseline: initial FOTO 46.  11/2: 50 Goal status: Partially met  2.  Pt. Able to tolerate standing 10 minutes while pulling up pants with mod. I safely while wearing prosthetic leg to improve daily activities.  Baseline:  limited standing tolerance Goal status: Not met  3.  Pt. Will ambulate 200 feet with use of SPC and mod. I to promote safety with household tasks/ walking into bathroom.   Baseline:  amb. With RW and min. A for safety.  11/2:   Goal status: Not met   PLAN: PT FREQUENCY: 2x/week  PT DURATION: 8 weeks  PLANNED INTERVENTIONS: Therapeutic exercises, Therapeutic activity, Neuromuscular re-education, Balance training, Gait training, Patient/Family education, Self Care, Joint mobilization, Prosthetic training, DME instructions, and Manual therapy  PLAN FOR NEXT SESSION:  Progress gait/ discuss pts. Cataract surgery and ability to get on surgical chair.   Pura Spice, PT, DPT # [phone] 03/10/2022, 12:36 PM

## 2022-03-11 ENCOUNTER — Encounter: Payer: Self-pay | Admitting: Ophthalmology

## 2022-03-14 ENCOUNTER — Ambulatory Visit
Admission: RE | Admit: 2022-03-14 | Discharge: 2022-03-14 | Disposition: A | Discharge status: Home / Self Care | Payer: Medicare Other | Attending: Ophthalmology | Admitting: Ophthalmology

## 2022-03-14 ENCOUNTER — Other Ambulatory Visit: Payer: Self-pay

## 2022-03-14 ENCOUNTER — Ambulatory Visit: Payer: Medicare Other | Admitting: Anesthesiology

## 2022-03-14 ENCOUNTER — Encounter: Payer: Self-pay | Admitting: Ophthalmology

## 2022-03-14 ENCOUNTER — Encounter
Admission: RE | Disposition: A | Discharge status: Home / Self Care | Payer: Self-pay | Source: Home / Self Care | Attending: Ophthalmology

## 2022-03-14 DIAGNOSIS — I739 Peripheral vascular disease, unspecified: Secondary | ICD-10-CM | POA: Insufficient documentation

## 2022-03-14 DIAGNOSIS — D631 Anemia in chronic kidney disease: Secondary | ICD-10-CM | POA: Diagnosis not present

## 2022-03-14 DIAGNOSIS — Z6830 Body mass index (BMI) 30.0-30.9, adult: Secondary | ICD-10-CM | POA: Diagnosis not present

## 2022-03-14 DIAGNOSIS — J449 Chronic obstructive pulmonary disease, unspecified: Secondary | ICD-10-CM | POA: Insufficient documentation

## 2022-03-14 DIAGNOSIS — G4730 Sleep apnea, unspecified: Secondary | ICD-10-CM | POA: Insufficient documentation

## 2022-03-14 DIAGNOSIS — Z87891 Personal history of nicotine dependence: Secondary | ICD-10-CM | POA: Diagnosis not present

## 2022-03-14 DIAGNOSIS — K219 Gastro-esophageal reflux disease without esophagitis: Secondary | ICD-10-CM | POA: Insufficient documentation

## 2022-03-14 DIAGNOSIS — I129 Hypertensive chronic kidney disease with stage 1 through stage 4 chronic kidney disease, or unspecified chronic kidney disease: Secondary | ICD-10-CM | POA: Diagnosis not present

## 2022-03-14 DIAGNOSIS — N183 Chronic kidney disease, stage 3 unspecified: Secondary | ICD-10-CM | POA: Diagnosis not present

## 2022-03-14 DIAGNOSIS — H2512 Age-related nuclear cataract, left eye: Secondary | ICD-10-CM | POA: Diagnosis present

## 2022-03-14 DIAGNOSIS — E669 Obesity, unspecified: Secondary | ICD-10-CM | POA: Diagnosis not present

## 2022-03-14 HISTORY — PX: CATARACT EXTRACTION W/PHACO: SHX586

## 2022-03-14 SURGERY — PHACOEMULSIFICATION, CATARACT, WITH IOL INSERTION
Anesthesia: Monitor Anesthesia Care | Site: Eye | Laterality: Left

## 2022-03-14 MED ORDER — ACETAMINOPHEN 325 MG PO TABS: 650.0000 mg | ORAL_TABLET | Freq: Once | ORAL | Status: DC | PRN

## 2022-03-14 MED ORDER — SIGHTPATH DOSE#1 NA HYALUR & NA CHOND-NA HYALUR IO KIT
PACK | INTRAOCULAR | Status: DC | PRN
  Administered 2022-03-14: 1 via OPHTHALMIC

## 2022-03-14 MED ORDER — SIGHTPATH DOSE#1 BSS IO SOLN
INTRAOCULAR | Status: DC | PRN
  Administered 2022-03-14: 15 mL

## 2022-03-14 MED ORDER — ONDANSETRON HCL 4 MG/2ML IJ SOLN: 4.0000 mg | Freq: Once | INTRAMUSCULAR | Status: DC | PRN

## 2022-03-14 MED ORDER — LACTATED RINGERS IV SOLN: INTRAVENOUS | Status: DC

## 2022-03-14 MED ORDER — ACETAMINOPHEN 160 MG/5ML PO SOLN: 325.0000 mg | ORAL | Status: DC | PRN

## 2022-03-14 MED ORDER — TETRACAINE HCL 0.5 % OP SOLN
1.0000 [drp] | OPHTHALMIC | Status: DC | PRN
  Administered 2022-03-14 (×3): 1 [drp] via OPHTHALMIC

## 2022-03-14 MED ORDER — SIGHTPATH DOSE#1 BSS IO SOLN
INTRAOCULAR | Status: DC | PRN
  Administered 2022-03-14: 85 mL via OPHTHALMIC

## 2022-03-14 MED ORDER — MOXIFLOXACIN HCL 0.5 % OP SOLN
OPHTHALMIC | Status: DC | PRN
  Administered 2022-03-14: .2 mL via OPHTHALMIC

## 2022-03-14 MED ORDER — MIDAZOLAM HCL 2 MG/2ML IJ SOLN
INTRAMUSCULAR | Status: DC | PRN
  Administered 2022-03-14: 1 mg via INTRAVENOUS

## 2022-03-14 MED ORDER — ARMC OPHTHALMIC DILATING DROPS
1.0000 | OPHTHALMIC | Status: DC | PRN
  Administered 2022-03-14 (×3): 1 via OPHTHALMIC

## 2022-03-14 MED ORDER — LIDOCAINE HCL (PF) 2 % IJ SOLN
INTRAOCULAR | Status: DC | PRN
  Administered 2022-03-14: 1 mL via INTRAOCULAR

## 2022-03-14 MED ORDER — FENTANYL CITRATE (PF) 100 MCG/2ML IJ SOLN
INTRAMUSCULAR | Status: DC | PRN
  Administered 2022-03-14: 50 ug via INTRAVENOUS

## 2022-03-14 SURGICAL SUPPLY — 19 items
CANNULA ANT/CHMB 27G (MISCELLANEOUS) IMPLANT
CANNULA ANT/CHMB 27GA (MISCELLANEOUS) IMPLANT
CATARACT SUITE SIGHTPATH (MISCELLANEOUS) ×1 IMPLANT
DISSECTOR HYDRO NUCLEUS 50X22 (MISCELLANEOUS) ×1 IMPLANT
FEE CATARACT SUITE SIGHTPATH (MISCELLANEOUS) ×1 IMPLANT
GLOVE SURG GAMMEX PI TX LF 7.5 (GLOVE) ×1 IMPLANT
GLOVE SURG SYN 8.5  E (GLOVE) ×1
GLOVE SURG SYN 8.5 E (GLOVE) ×1 IMPLANT
GLOVE SURG SYN 8.5 PF PI (GLOVE) ×1 IMPLANT
LENS IOL TECNIS EYHANCE 24.5 (Intraocular Lens) IMPLANT
NDL FILTER BLUNT 18X1 1/2 (NEEDLE) ×1 IMPLANT
NEEDLE FILTER BLUNT 18X1 1/2 (NEEDLE) ×1 IMPLANT
PACK VIT ANT 23G (MISCELLANEOUS) IMPLANT
RING MALYGIN (MISCELLANEOUS) IMPLANT
SUT ETHILON 10-0 CS-B-6CS-B-6 (SUTURE)
SUTURE EHLN 10-0 CS-B-6CS-B-6 (SUTURE) IMPLANT
SYR 3ML LL SCALE MARK (SYRINGE) ×1 IMPLANT
SYR 5ML LL (SYRINGE) ×1 IMPLANT
WATER STERILE IRR 250ML POUR (IV SOLUTION) ×1 IMPLANT

## 2022-03-14 NOTE — Anesthesia Postprocedure Evaluation (Signed)
Anesthesia Post Note  Patient: Jasmine Morrow  Procedure(s) Performed: CATARACT EXTRACTION PHACO AND INTRAOCULAR LENS PLACEMENT (IOC) LEFT (Left: Eye)  Patient location during evaluation: PACU Anesthesia Type: MAC Level of consciousness: awake and alert, oriented and patient cooperative Pain management: pain level controlled Vital Signs Assessment: post-procedure vital signs reviewed and stable Respiratory status: spontaneous breathing, nonlabored ventilation and respiratory function stable Cardiovascular status: blood pressure returned to baseline and stable Postop Assessment: adequate PO intake Anesthetic complications: no   No notable events documented.   Last Vitals:  Vitals:   03/14/22 0852 03/14/22 0857  BP: 136/69 132/71  Pulse: 72 81  Resp: 14 14  Temp: (!) 36.3 C (!) 36.3 C  SpO2: 100% 99%    Last Pain:  Vitals:   03/14/22 0857  TempSrc:   PainSc: 0-No pain                 Darrin Nipper

## 2022-03-14 NOTE — H&P (Signed)
[place]   Primary Care Physician:  Rusty Aus, MD Ophthalmologist: Dr. Benay Pillow  Pre-Procedure History & Physical: HPI:  Jasmine Morrow is a 77 y.o. female here for cataract surgery.   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   Hypertension    PONV (postoperative nausea and vomiting)    Seasonal allergies    Sleep apnea    does not uses CPAP machine anymore    Wheelchair dependent    able to self transfer    Past Surgical History:  Procedure Laterality Date   APPENDECTOMY     CATARACT EXTRACTION W/PHACO Right 02/28/2022   Procedure: CATARACT EXTRACTION PHACO AND INTRAOCULAR LENS PLACEMENT (IOC) RIGHT  4.82  00:30.9;  Surgeon: Eulogio Bear, MD;  Location: [place];  Service: Ophthalmology;  Laterality: Right;  sleep apnea   CESAREAN SECTION     CHOLECYSTECTOMY     DILATION AND CURETTAGE OF UTERUS     ESOPHAGOGASTRODUODENOSCOPY N/A 02/22/2019   Procedure: ESOPHAGOGASTRODUODENOSCOPY (EGD);  Surgeon: Toledo, Benay Pike, MD;  Location: [facility];  Service: Gastroenterology;  Laterality: N/A;   FLEXIBLE SIGMOIDOSCOPY N/A 02/22/2019   Procedure: FLEXIBLE SIGMOIDOSCOPY;  Surgeon: Toledo, Benay Pike, MD;  Location: [facility];  Service: Gastroenterology;  Laterality: N/A;   HARDWARE REMOVAL Right 11/12/2019   Procedure: Right ankle hardware removal;  Surgeon: Hessie Knows, MD;  Location: [facility];  Service: Orthopedics;  Laterality: Right;   HARDWARE REMOVAL Right 04/17/2020   Procedure: HARDWARE REMOVAL FROM RIGHT ANKLE AND RIGHT KNEE; IMPLANT OF CEMENT SPACERS IN RIGHT KNEE;  Surgeon: Dereck Leep, MD;  Location: [facility];  Service: Orthopedics;  Laterality: Right;   JOINT REPLACEMENT     LEG AMPUTATION ABOVE KNEE Right 12/02/2020   [facility]   ORIF ANKLE FRACTURE Right 03/14/2019   Procedure: OPEN  REDUCTION INTERNAL FIXATION (ORIF) ANKLE FRACTURE, MEDIAL MALLEOLUS;  Surgeon: Hessie Knows, MD;  Location: [facility];  Service: Orthopedics;  Laterality: Right;   ORIF ANKLE FRACTURE Right 02/08/2019   Procedure: OPEN REDUCTION INTERNAL FIXATION (ORIF) ANKLE FRACTURE, post malleolus;  Surgeon: Hessie Knows, MD;  Location: [facility];  Service: Orthopedics;  Laterality: Right;   PARTIAL HYSTERECTOMY  1993   REPLACEMENT TOTAL KNEE Bilateral 2008   2009   SCAR DEBRIDEMENT OF TOTAL KNEE  01/2020   SYNDESMOSIS REPAIR Right 02/08/2019   Procedure: SYNDESMOSIS REPAIR;  Surgeon: Hessie Knows, MD;  Location: [facility];  Service: Orthopedics;  Laterality: Right;   TEE WITHOUT CARDIOVERSION N/A 02/26/2019   Procedure: TRANSESOPHAGEAL ECHOCARDIOGRAM (TEE);  Surgeon: Corey Skains, MD;  Location: [facility];  Service: Cardiovascular;  Laterality: N/A;   TOTAL KNEE REVISION WITH SCAR DEBRIDEMENT/PATELLA REVISION WITH POLY EXCHANGE Right 01/24/2020   Procedure: TOTAL KNEE REVISION WITH SCAR DEBRIDEMENT/PATELLA REVISION WITH POLY EXCHANGE;  Surgeon: Dereck Leep, MD;  Location: [facility];  Service: Orthopedics;  Laterality: Right;   TUMOR REMOVAL     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 Authorizing Provider  Calcium-Vitamin D 600-200 MG-UNIT per tablet Take 2 tablets by mouth daily.   Yes [provider]  cetirizine (ZYRTEC) 10 MG tablet Take 10 mg by mouth in the morning.   Yes [provider]  Docusate Calcium (STOOL  SOFTENER PO) Take by mouth in the morning and at bedtime.   Yes [provider]  escitalopram (LEXAPRO) 10 MG tablet Take 10 mg by mouth daily. 10/17/19  Yes [provider]  gabapentin (NEURONTIN) 300 MG capsule Take 300 mg by mouth 3 (three) times daily.   Yes [provider]  hydrochlorothiazide (HYDRODIURIL) 12.5 MG tablet Take 12.5 mg by mouth daily as needed.   Yes  [provider]  melatonin 5 MG TABS Take 5 mg by mouth at bedtime as needed.   Yes [provider]  minocycline (DYNACIN) 100 MG tablet Take 100 mg by mouth 2 (two) times daily.   Yes [provider]  Multiple Vitamins-Minerals (PRESERVISION AREDS 2 PO) Take 1 tablet by mouth 2 (two) times daily.   Yes [provider]  nystatin ointment (MYCOSTATIN) Apply 1 application topically daily as needed for irritation. 10/02/19  Yes [provider]  ondansetron (ZOFRAN) 4 MG tablet Take 1 tablet (4 mg total) by mouth 2 (two) times daily as needed for nausea or vomiting. 03/18/20  Yes Tsosie Billing, MD  pantoprazole (PROTONIX) 40 MG tablet Take 40 mg by mouth 2 (two) times daily.   Yes [provider]  polyethylene glycol (MIRALAX) 17 g packet Take 17 g by mouth daily as needed. Patient taking differently: Take 17 g by mouth daily. 01/31/20  Yes Shelly Coss, MD  Skin Protectants, Misc. (CALAZIME SKIN PROTECTANT EX) Apply 1 application topically 4 (four) times daily as needed (diaper changes).   Yes [provider]  fluconazole (DIFLUCAN) 100 MG tablet Take 1 tablet (100 mg total) by mouth daily. Patient not taking: Reported on 02/23/2022 08/17/20   Tsosie Billing, MD  Multiple Vitamin (MULTIVITAMIN WITH MINERALS) TABS tablet Take 1 tablet by mouth daily. Patient not taking: Reported on 02/28/2022    [provider]  traMADol (ULTRAM) 50 MG tablet Take 1 tablet (50 mg total) by mouth every 4 (four) hours as needed for moderate pain. Patient not taking: Reported on 02/23/2022 04/23/20   Tamala Julian B, PA-C    Allergies as of 02/11/2022 - Review Complete 11/10/2021  Allergen Reaction Noted   Celecoxib Nausea Only 03/27/2014   Doxycycline  04/08/2020   Hydrocodone-acetaminophen Nausea And Vomiting 03/27/2014   Meloxicam Nausea Only 03/27/2014   Morphine sulfate er beads Itching 03/27/2014   Naproxen Swelling  03/27/2014   Oxycodone Other (See Comments) 01/24/2020   Rofecoxib Nausea Only 03/27/2014   Singulair [montelukast] Swelling 04/06/2020   Sulfa antibiotics Other (See Comments) 02/07/2019   Tape Dermatitis 04/15/2020   Nickel Rash 03/27/2014    Family History  Problem Relation Age of Onset   Bone cancer Mother    COPD Father     Social History   Socioeconomic History   Marital status: Married    Spouse name: Not on file   Number of children: Not on file   Years of education: Not on file   Highest education level: Not on file  Occupational History   Not on file  Tobacco Use   Smoking status: Former    Packs/day: 2.00    Years: 30.00    Total pack years: 60.00    Types: Cigarettes    Quit date: 10/02/1990    Years since quitting: 31.4   Smokeless tobacco: Never  Vaping Use   Vaping Use: Never used  Substance and Sexual Activity   Alcohol use: No    Alcohol/week: 0.0 standard drinks of alcohol   Drug  use: No   Sexual activity: Not on file  Other Topics Concern   Not on file  Social History Narrative   Not on file   Social Determinants of Health   Financial Resource Strain: Not on file  Food Insecurity: Not on file  Transportation Needs: Not on file  Physical Activity: Not on file  Stress: Not on file  Social Connections: Not on file  Intimate Partner Violence: Not on file    Review of Systems: See HPI, otherwise negative ROS  Physical Exam: BP (!) 157/96   Pulse 76   Temp 97.7 F (36.5 C) (Temporal)   Resp 20   Ht 5' 8"$  (1.727 m)   Wt 90 kg   SpO2 100%   BMI 30.17 kg/m  General:   Alert, cooperative in NAD Head:  Normocephalic and atraumatic. Respiratory:  Normal work of breathing. Cardiovascular:  RRR  Impression/Plan: Jasmine Morrow is here for cataract surgery.  Risks, benefits, limitations, and alternatives regarding cataract surgery have been reviewed with the patient.  Questions have been answered.  All parties agreeable.   Benay Pillow, MD  03/14/2022, 8:23 AM'

## 2022-03-14 NOTE — Transfer of Care (Signed)
Immediate Anesthesia Transfer of Care Note  Patient: Jasmine Morrow  Procedure(s) Performed: CATARACT EXTRACTION PHACO AND INTRAOCULAR LENS PLACEMENT (IOC) LEFT (Left: Eye)  Patient Location: PACU  Anesthesia Type: MAC  Level of Consciousness: awake, alert  and patient cooperative  Airway and Oxygen Therapy: Patient Spontanous Breathing and Patient connected to supplemental oxygen  Post-op Assessment: Post-op Vital signs reviewed, Patient's Cardiovascular Status Stable, Respiratory Function Stable, Patent Airway and No signs of Nausea or vomiting  Post-op Vital Signs: Reviewed and stable  Complications: No notable events documented.

## 2022-03-14 NOTE — Anesthesia Preprocedure Evaluation (Addendum)
Anesthesia Evaluation  Patient identified by MRN, date of birth, ID band Patient awake    Reviewed: Allergy & Precautions, NPO status , Patient's Chart, lab work & pertinent test results  History of Anesthesia Complications (+) PONV and history of anesthetic complications  Airway Mallampati: I   Neck ROM: Full    Dental   [place] :   Pulmonary asthma , sleep apnea , COPD, former smoker (quit 1992)   Pulmonary exam normal breath sounds clear to auscultation       Cardiovascular hypertension, + Peripheral Vascular Disease (s/p AKA 2022)  Normal cardiovascular exam Rhythm:Regular Rate:Normal     Neuro/Psych negative neurological ROS     GI/Hepatic ,GERD  ,,  Endo/Other  Obesity   Renal/GU Renal disease (stage III CKD)     Musculoskeletal   Abdominal   Peds  Hematology  (+) Blood dyscrasia, anemia   Anesthesia Other Findings   Reproductive/Obstetrics                             Anesthesia Physical Anesthesia Plan  ASA: 3  Anesthesia Plan: MAC   Post-op Pain Management:    Induction: Intravenous  PONV Risk Score and Plan: 3 and Treatment may vary due to age or medical condition, Midazolam and TIVA  Airway Management Planned: Natural Airway and Nasal Cannula  Additional Equipment:   Intra-op Plan:   Post-operative Plan:   Informed Consent: I have reviewed the patients History and Physical, chart, labs and discussed the procedure including the risks, benefits and alternatives for the proposed anesthesia with the patient or authorized representative who has indicated his/her understanding and acceptance.     Dental advisory given  Plan Discussed with: CRNA  Anesthesia Plan Comments: (LMA/GETA backup discussed.  Patient consented for risks of anesthesia including but not limited to:  - adverse reactions to medications - damage to eyes, teeth, lips or other oral mucosa - nerve  damage due to positioning  - sore throat or hoarseness - damage to heart, brain, nerves, lungs, other parts of body or loss of life  Informed patient about role of CRNA in peri- and intra-operative care.  Patient voiced understanding.)       Anesthesia Quick Evaluation

## 2022-03-14 NOTE — Op Note (Signed)
OPERATIVE NOTE  Jasmine Morrow [id number] 03/14/2022   PREOPERATIVE DIAGNOSIS:  Nuclear sclerotic cataract left eye.  H25.12   POSTOPERATIVE DIAGNOSIS:    Nuclear sclerotic cataract left eye.     PROCEDURE:  Phacoemusification with posterior chamber intraocular lens placement of the left eye   LENS:   Implant Name Type Inv. Item Serial No. Manufacturer Lot No. LRB No. Used Action  LENS IOL TECNIS EYHANCE 24.5 - [id number] Intraocular Lens LENS IOL TECNIS EYHANCE 24.5 [id number] [employer]  Left 1 Implanted      Procedure(s) with comments: CATARACT EXTRACTION PHACO AND INTRAOCULAR LENS PLACEMENT (IOC) LEFT (Left) - 4.32 0:39.7  DIB00 +24.5   ULTRASOUND TIME: 0 minutes 39 seconds.  CDE 4.32   SURGEON:  Benay Pillow, MD, MPH   ANESTHESIA:  Topical with tetracaine drops augmented with 1% preservative-free intracameral lidocaine.  ESTIMATED BLOOD LOSS: <1 mL   COMPLICATIONS:  None.   DESCRIPTION OF PROCEDURE:  The patient was identified in the holding room and transported to the operating room and placed in the supine position under the operating microscope.  The left eye was identified as the operative eye and it was prepped and draped in the usual sterile ophthalmic fashion.   A 1.0 millimeter clear-corneal paracentesis was made at the 5:00 position. 0.5 ml of preservative-free 1% lidocaine with epinephrine was injected into the anterior chamber.  The anterior chamber was filled with viscoelastic.  A 2.4 millimeter keratome was used to make a near-clear corneal incision at the 2:00 position.  A curvilinear capsulorrhexis was made with a cystotome and capsulorrhexis forceps.  Balanced salt solution was used to hydrodissect and hydrodelineate the nucleus.   Phacoemulsification was then used in stop and chop fashion to remove the lens nucleus and epinucleus.  The remaining cortex was then removed using the irrigation and aspiration handpiece. Viscoelastic was then placed into the  capsular bag to distend it for lens placement.  A lens was then injected into the capsular bag.  The remaining viscoelastic was aspirated.   Wounds were hydrated with balanced salt solution.  The anterior chamber was inflated to a physiologic pressure with balanced salt solution.  Intracameral vigamox 0.1 mL undiltued was injected into the eye and a drop placed onto the ocular surface.  No wound leaks were noted.  The patient was taken to the recovery room in stable condition without complications of anesthesia or surgery  Benay Pillow 03/14/2022, 8:51 AM

## 2022-03-15 ENCOUNTER — Encounter: Payer: Self-pay | Admitting: Ophthalmology

## 2022-03-15 ENCOUNTER — Encounter: Payer: BC Managed Care – PPO | Admitting: Physical Therapy

## 2022-03-17 ENCOUNTER — Encounter: Payer: BC Managed Care – PPO | Admitting: Physical Therapy

## 2022-03-22 ENCOUNTER — Ambulatory Visit: Payer: Medicare Other

## 2022-03-22 DIAGNOSIS — R293 Abnormal posture: Secondary | ICD-10-CM

## 2022-03-22 DIAGNOSIS — Z89611 Acquired absence of right leg above knee: Secondary | ICD-10-CM

## 2022-03-22 DIAGNOSIS — M6281 Muscle weakness (generalized): Secondary | ICD-10-CM

## 2022-03-22 DIAGNOSIS — R269 Unspecified abnormalities of gait and mobility: Secondary | ICD-10-CM

## 2022-03-22 NOTE — Therapy (Signed)
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   Hypertension    PONV (postoperative nausea and vomiting)    Seasonal allergies    Sleep apnea    does not uses CPAP machine anymore    Wheelchair dependent    able to self transfer   Past Surgical History:  Procedure Laterality Date   APPENDECTOMY     CATARACT EXTRACTION W/PHACO Right 02/28/2022   Procedure: CATARACT EXTRACTION PHACO AND INTRAOCULAR LENS PLACEMENT ([facility]) RIGHT  4.82  00:30.9;  Surgeon: Eulogio Bear, MD;  Location: [facility];  Service: Ophthalmology;  Laterality: Right;  sleep apnea   CATARACT EXTRACTION W/PHACO Left 03/14/2022   Procedure: CATARACT EXTRACTION PHACO AND INTRAOCULAR LENS PLACEMENT ([place]) LEFT;  Surgeon: Eulogio Bear, MD;  Location: [facility];  Service: Ophthalmology;  Laterality: Left;  4.32 0:39.7   CESAREAN SECTION     CHOLECYSTECTOMY     DILATION AND CURETTAGE OF UTERUS     ESOPHAGOGASTRODUODENOSCOPY N/A 02/22/2019   Procedure: ESOPHAGOGASTRODUODENOSCOPY (EGD);  Surgeon: Toledo, Benay Pike, MD;  Location: [facility];  Service: Gastroenterology;  Laterality: N/A;   FLEXIBLE SIGMOIDOSCOPY N/A 02/22/2019   Procedure: FLEXIBLE SIGMOIDOSCOPY;  Surgeon: Toledo, Benay Pike, MD;  Location: [facility];  Service: Gastroenterology;  Laterality: N/A;   HARDWARE REMOVAL Right 11/12/2019   Procedure: Right ankle hardware removal;  Surgeon: Hessie Knows, MD;  Location: [facility];  Service: Orthopedics;   Laterality: Right;   HARDWARE REMOVAL Right 04/17/2020   Procedure: HARDWARE REMOVAL FROM RIGHT ANKLE AND RIGHT KNEE; IMPLANT OF CEMENT SPACERS IN RIGHT KNEE;  Surgeon: Dereck Leep, MD;  Location: [facility];  Service: Orthopedics;  Laterality: Right;   JOINT REPLACEMENT     LEG AMPUTATION ABOVE KNEE Right 12/02/2020   [facility]   ORIF ANKLE FRACTURE Right 03/14/2019   Procedure: OPEN REDUCTION INTERNAL FIXATION (ORIF) ANKLE FRACTURE, MEDIAL MALLEOLUS;  Surgeon: Hessie Knows, MD;  Location: [facility];  Service: Orthopedics;  Laterality: Right;   ORIF ANKLE FRACTURE Right 02/08/2019   Procedure: OPEN REDUCTION INTERNAL FIXATION (ORIF) ANKLE FRACTURE, post malleolus;  Surgeon: Hessie Knows, MD;  Location: [facility];  Service: Orthopedics;  Laterality: Right;   PARTIAL HYSTERECTOMY  1993   REPLACEMENT TOTAL KNEE Bilateral 2008   2009   SCAR DEBRIDEMENT OF TOTAL KNEE  01/2020   SYNDESMOSIS REPAIR Right 02/08/2019   Procedure: SYNDESMOSIS REPAIR;  Surgeon: Hessie Knows, MD;  Location: [facility];  Service: Orthopedics;  Laterality: Right;   TEE WITHOUT CARDIOVERSION N/A 02/26/2019   Procedure: TRANSESOPHAGEAL ECHOCARDIOGRAM (TEE);  Surgeon: Corey Skains, MD;  Location: [facility];  Service: Cardiovascular;  Laterality: N/A;   TOTAL KNEE REVISION WITH SCAR DEBRIDEMENT/PATELLA REVISION WITH POLY EXCHANGE Right 01/24/2020   Procedure: TOTAL KNEE REVISION WITH SCAR DEBRIDEMENT/PATELLA REVISION WITH POLY EXCHANGE;  Surgeon: Dereck Leep, MD;  Location: [facility];  Service: Orthopedics;  Laterality: Right;  TUMOR REMOVAL     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   Patient Active Problem List   Diagnosis Date Noted   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[place]) 04/17/2020   Anxiety 04/17/2020   History of GI diverticular bleed 04/17/2020   Chest pain 04/17/2020   PAD (peripheral artery disease) ([place]) 04/05/2020   Ulcer of right leg ([place]) 04/05/2020   COPD (chronic obstructive pulmonary disease) ([place])  04/05/2020   GERD (gastroesophageal reflux disease) 04/05/2020   Abscess of right knee 01/22/2020   Pressure injury of skin 02/28/2019   SOB (shortness of breath)    Gastrointestinal hemorrhage    Sepsis ([place])    Symptomatic anemia 02/20/2019   Trimalleolar fracture of ankle, closed, right, initial encounter 02/07/2019   Multiple lung nodules 03/31/2014   Extrinsic asthma 03/27/2014   OSA on CPAP 03/27/2014    PCP: Rusty Aus, MD  REFERRING PROVIDER: Rusty Aus, MD  REFERRING DIAG: Above knee amputation of right lower extremity  THERAPY DIAG:  S/P AKA (above knee amputation) unilateral, right (HCC)  Muscle weakness (generalized)  Gait difficulty  Abnormal posture  Rationale for Evaluation and Treatment Rehabilitation  ONSET DATE: 12/02/20  SUBJECTIVE:  EVALUATION  PERTINENT HISTORY: Patient Profile:   Jasmine Morrow is a 77 y.o. female Chief Complaint  Patient presents with  Visit Follow Up  Doing well.   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 Cardiac cath 10/2011 showed completely normal coronary arteries, however she did have mild left ventricular dysfunction with anterior wall motion abnormality. Left ventricular EF was 51%.  History of chickenpox  Hyperlipidemia  patient states she has never been diagnosed with this  Hypertension  Knee joint replacement by other  means  Macular degeneration  Major depressive disorder, recurrent, mild (CMS-HCC)  Medicare annual wellness visit, initial 08/09/2019  7/21  Multiple thyroid nodules  Normal coronary arteries 09/06/2013  Normal coronary anatomy by cardiac catheterization 10/12/11  Osteoarthritis  Ovarian cyst  PONV (postoperative nausea and vomiting)  nausea  Postoperative urinary retention 12/08/2020  Rash on lips 12/21/2020  Seasonal allergies  Sinusitis, unspecified  Sleep apnea  On CPAP.  Slow transit constipation 12/10/2020  Trimalleolar fracture of ankle, closed, right, initial encounter [id number]  Complicated by staph infection 1/21, 4 weeks IV Ancef, [facility]  Ulcer   Past Surgical History:  Procedure Laterality Date  SALPINGO OOPHORECTOMY Bilateral 1993  Right total knee arthroplasty 10/11/2006  Dr Marry Guan  Left total knee arthroplasty 10/12/2007  Dr Marry Guan  COLONOSCOPY 07/30/2012  internal hemorrhoids, diverticulosis  ANKLE ARTHODESIS W/ ARTHROSCOPY Right 02/08/2019  03/14/2019 second surgery  COLONOSCOPY 02/22/2019  Blood in entire colon/Diverticulosis - Presumed diverticular bleed. No repeat recommended per TKT.  EGD 02/22/2019  Gastritis/gastric ulcers/Hiatal hernia/Otherwise normal - no repeat recommended per TKT.  ORIF ANKLE FRACTURE Right 03/14/2019  Dr. Rudene Christians  REMOVAL HARDWARE ANKLE FOOT/TOES Right 11/12/2019  Dr. Rudene Christians  Right knee arthrotomy, irrigation and debridement of the right knee, polyethylene exchange 01/24/2020  Dr Marry Guan  Removal of hardware (plates and screws) from the right ankle with irrigation, debridement, and placement of Stimulan antibiotic beads 04/17/2020  Dr Marry Guan  Right knee arthrotomy, extensive irrigation debridement, removal of right total knee implants, and placement of antibiotic impregnated polymethylmethacrylate cement spacer 04/17/2020  Dr Marry Guan  AMPUTATION LEG ABOVE KNEE AKA Right 12/02/2020  Procedure: AMPUTATION, THIGH, THROUGH FEMUR, ANY  LEVEL; Surgeon: Lyndle Herrlich, MD; Location: [facility]; Service: Orthopedics; Laterality: Right;  TRANSFER ADJACENT TISSUE LEG Right 12/02/2020  Procedure: ADJACENT TISSUE TRANSFER OR REARRANGEMENT, LEG; DEFECT 10 SQ CM OR LESS; Surgeon: Lyndle Herrlich, MD; Location: [facility]; Service: Orthopedics; Laterality: Right;  ASPIRATION/INJECTION MAJOR JOINT/BURSA KNEE Left 12/02/2020  Procedure: ARTHROCENTESIS, ASPIRATION AND/OR INJECTION, MAJOR JOINT OR BURSA, KNEE; WITHOUT ULTRASOUND GUIDANCE; Surgeon: Lyndle Herrlich, MD; Location: [facility]; Service: Orthopedics; Laterality: Left;  REMOVAL KNEE PROSTHESIS W/POSSIBLE PLACEMENT SPACER Left 01/18/2021  Procedure: REMOVAL OF PROSTHESIS, INCLUDING TOTAL KNEE PROSTHESIS, METHYLMETHACRYLATE WITH OR WITHOUT INSERTION OF SPACER, KNEE; Surgeon: Ihor Dow, MD; Location: [facility]; Service: Orthopedics; Laterality: Left;  INSERTION NON-BIODEGRADABLE DRUG DELIVERY IMPLANT Left 01/18/2021  Procedure: INSERTION, KNEE, bioresorbable, biodegradable, NON-BIODEGRADABLE DRUG DELIVERY IMPLANT; Surgeon: Ihor Dow, MD; Location: [facility]; Service: Orthopedics; Laterality: Left;  ABOVE KNEE LEG AMPUTATION  12/02/2020  APPENDECTOMY Est 1968  CESAREAN SECTION  CHOLECYSTECTOMY  FRACTURE SURGERY 1/21  HYSTERECTOMY partial  JOINT REPLACEMENT 2008 & 2009  TUBAL LIGATION 3/79   ALLERGIES: Allergies  Allergen Reactions  Singulair [Montelukast] Other (See Comments)  Gland swelling  Sulfa (Sulfonamide Antibiotics) Swelling  Oxycodone Dizziness and Other (See Comments)  Vancomycin Analogues Other (See Comments)  Ringing in ears  Avinza [Morphine] Itching  Darvocet A500 [Propoxyphene N-Acetaminophen] Nausea  Nickel Rash  Ultracet [Tramadol-Acetaminophen] Rash  Vicodin [Hydrocodone-Acetaminophen] Vomiting  Vioxx [Rofecoxib] Other (See Comments)  GI   CURRENT MEDICATIONS: Current Outpatient Medications:  acetaminophen (TYLENOL) 500 MG tablet, Take 1,000 mg by mouth 3 (three) times a day Twice daily per patient., PRN Not Currently Taking aspirin 81 MG EC tablet, Take 1 tablet (81 mg total) by mouth at bedtime, Taking calcium carbonate-vitamin D3 (CALTRATE 600+D) 600 mg-10 mcg (400 unit) tablet, Take 0.5 tablets by mouth 2 (two) times daily, Taking cetirizine (ZYRTEC) 10 mg capsule, Take 1 capsule (10 mg total) by mouth once daily, Taking escitalopram oxalate (LEXAPRO) 10 MG tablet, Take 1 tablet (10 mg total) by mouth once daily, Taking estradioL (ESTRACE) 0.01 % (0.1 mg/gram) vaginal cream, Place 2 g vaginally twice a week, Taking folic acid/multivit,iron,miner (MULTIVIT-IRON-MIN-FOLIC ACID ORAL), Take 1 tablet by mouth once daily, Taking gabapentin (NEURONTIN) 300 MG capsule, Take 1 capsule (300 mg total) by mouth 3 (three) times daily Phantom limb pain, Taking melatonin 3 mg tablet, Take 1 tablet (3 mg total) by mouth at bedtime, Taking multivitamin with iron (COMPLETE MULTIVITAMIN-MINERAL) tablet, Take 1 tablet by mouth, Taking multivitamin with minerals, EYE, (PRESERVISION AREDS 2) soft gel capsule, Take 1 capsule by mouth 2 (two) times daily, Taking nystatin (MYCOSTATIN) 100,000 unit/gram powder, Apply small amount topically twice daily for rash under breasts, PRN Not Currently Taking pantoprazole (PROTONIX) 40 MG DR tablet, Take 1 tablet (40 mg total) by mouth 2 (two) times daily. Please call to schedule an appt. Thanks!, Taking polyethylene glycol (MIRALAX) powder, Take 17 g by mouth once daily Mix in 4-8ounces of fluid prior to taking., Taking sennosides-docusate (SENOKOT-S) 8.6-50 mg tablet, Take 1 tablet by mouth 2 (two) times daily For constipation, Taking lactose-reduced  food (ENSURE PLUS ORAL), Take 237 mLs by mouth 2 (two) times daily With breakfast and dinner  HPI   CLINICAL SUMMARY:  Patient post right AKA and working with prosthesis. 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 Able to do housework, improving  PAIN:  Are you having pain? Yes: NPRS scale: 5/10 Pain location: L knee Pain description: aching Aggravating factors: walking Relieving factors: rest  PRECAUTIONS: None  WEIGHT BEARING RESTRICTIONS No  FALLS:  Has patient fallen in last 6 months? No  LIVING ENVIRONMENT: Lives with: lives with their spouse Lives in: House/apartment Stairs: No Has following equipment at home: Gilford Rile - 4 wheeled and Wheelchair (manual)  OCCUPATION: retired  PLOF: Requires assistive device for independence  PATIENT GOALS  improve standing tolerance/ pull up pants/ ambulate with [place] safely.     OBJECTIVE:   PATIENT SURVEYS:  FOTO initial 46/ goal 68.   11/2: 50  COGNITION:  Overall cognitive status: Within functional limits for tasks assessed     SENSATION: WFL  POSTURE: rounded shoulders, forward head, flexed trunk , and weight shift left  PALPATION: No tenderness along R distal residual limb.  Discussed phantom limb sensation.    LOWER EXTREMITY ROM:  B LE AROM WFL except L knee extension secondary to articulating spacer.  Did not assess L/R hip extension at this time.    LOWER EXTREMITY MMT:  MMT Right eval Left eval  Hip flexion 4/5 4/5  Hip extension    Hip abduction 4+/5 4+/5  Hip adduction 4+/5 4+/5  Hip internal rotation    Hip external rotation    Knee flexion N/A 4+/5  Knee extension N/A 4+/5  Ankle dorsiflexion  5/5  Ankle plantarflexion    Ankle inversion    Ankle eversion     (Blank rows = not tested)  GAIT: Distance walked: in gym/ //-bars Assistive device utilized: Environmental consultant - 2 wheeled Level of assistance: CGA Comments: Cuing for posture correction with mirror feedback.  Flexed posture/ heavy UE assist.     TODAY'S TREATMENT:  03/22/22  Subjective:  Pt. Reports no new complaints.  Pt stated: " I had my cataract surgery 10 day s ago. I haven't been walking much. I feel weak."     Objective:    There.ex.: 39   Nustep L3 x 10 min. With BLE. BUE spared 2/2 to recent cataract Sx.             Partial STS on EOM 3 x 10 reps            Partial Squats on EOB 2 x 10 reps.       Gait training: 5 mins (not charged)    Amb. In gym/ hallway with use of RW and focus on recip. Pt became tired after Step pattern/ maintaining proper BOS.  Pt. Maintained consistent  step pattern/ heel strike and good knee mechanism control.  No loss of balance or episodes of knee buckling.   95 feet before seated rest break.    Did not performed 2/2 pt weak.  Ascend/ descend stairs 2x with step to gait and no knee buckling issues today.  Heavy UE assist and focus on R knee mechanism control and preventing toe clipping on step.  Mod. Ind./ SBA with no PT assist at stairs.  Standing L/R 1st step touches with B UE assist on handrails.     PATIENT EDUCATION:  Education details: HEP/ gait and prosthetic training.  Person educated: Patient  and Spouse Education method: Explanation, Demonstration, and Verbal cues Education comprehension: verbalized understanding, returned demonstration, and tactile cues required   HOME EXERCISE PROGRAM: Prone position hip stretches/ Standing marching at kitchen counter with w/c behind patient.      ASSESSMENT:  CLINICAL IMPRESSION: Pt. Presented to PT session with prosthesis and increased generalized weakness. PT deferred pt from UE exs 2/2 to eye surgery. Pt demonstrated weakness evident by poor clearance of Glutes with Partial squats. Pt overall has impaired endurance due to decreased activity tolerance.  Pt. Will benefit from skilled PT services to improve standing tolerance/ gait independence and ADL.    OBJECTIVE IMPAIRMENTS Abnormal gait, decreased activity tolerance, decreased balance, decreased endurance, decreased mobility, difficulty walking, decreased ROM, decreased strength, decreased safety awareness, impaired flexibility, improper body mechanics,  prosthetic dependency , and pain.   ACTIVITY LIMITATIONS carrying, lifting, standing, squatting, stairs, transfers, bed mobility, bathing, toileting, dressing, hygiene/grooming, and locomotion level  PARTICIPATION LIMITATIONS: cleaning, laundry, driving, shopping, community activity, and yard work  [place] Past/current experiences are also affecting patient's functional outcome.   REHAB POTENTIAL: Good  CLINICAL DECISION MAKING: Evolving/moderate complexity  EVALUATION COMPLEXITY: High   GOALS: Goals reviewed with patient Yes  SHORT TERM GOALS: Target date: 10/07/21 Pt. Able to don/doff R prosthetic leg independently to improve ADL/standing tolerance.  Baseline:  min. A to don/ doff prosthetic leg Goal status: Goal met   LONG TERM GOALS: Target date: 03/29/22  Pt. Will increase FOTO to 54 to improve functional mobility.  Baseline: initial FOTO 46.  11/2: 50 Goal status: Partially met  2.  Pt. Able to tolerate standing 10 minutes while pulling up pants with mod. I safely while wearing prosthetic leg to improve daily activities.  Baseline:  limited standing tolerance Goal status: Not met  3.  Pt. Will ambulate 200 feet with use of SPC and mod. I to promote safety with household tasks/ walking into bathroom.   Baseline:  amb. With RW and min. A for safety.  11/2:   Goal status: Not met   PLAN: PT FREQUENCY: 2x/week  PT DURATION: 8 weeks  PLANNED INTERVENTIONS: Therapeutic exercises, Therapeutic activity, Neuromuscular re-education, Balance training, Gait training, Patient/Family education, Self Care, Joint mobilization, Prosthetic training, DME instructions, and Manual therapy  PLAN FOR NEXT SESSION:  Progress gait/ discuss pts. Cataract surgery and ability to get on surgical chair.   Joaquin Music PT DPT 6:16 PM,03/22/22

## 2022-03-24 ENCOUNTER — Ambulatory Visit: Payer: Medicare Other

## 2022-03-24 DIAGNOSIS — M6281 Muscle weakness (generalized): Secondary | ICD-10-CM

## 2022-03-24 DIAGNOSIS — Z89611 Acquired absence of right leg above knee: Secondary | ICD-10-CM

## 2022-03-24 DIAGNOSIS — R269 Unspecified abnormalities of gait and mobility: Secondary | ICD-10-CM

## 2022-03-24 DIAGNOSIS — R293 Abnormal posture: Secondary | ICD-10-CM

## 2022-03-24 NOTE — Therapy (Signed)
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   Hypertension    PONV (postoperative nausea and vomiting)    Seasonal allergies    Sleep apnea    does not uses CPAP machine anymore    Wheelchair dependent    able to self transfer   Past Surgical History:  Procedure Laterality Date   APPENDECTOMY     CATARACT EXTRACTION W/PHACO Right 02/28/2022   Procedure: CATARACT EXTRACTION PHACO AND INTRAOCULAR LENS PLACEMENT (I[facility]) RIGHT  4.82  00:30.9;  Surgeon: Jasmine Bear, MD;  Location: [place];  Service: Ophthalmology;  Laterality: Right;  sleep apnea   CATARACT EXTRACTION W/PHACO Left 03/14/2022   Procedure: CATARACT EXTRACTION PHACO AND INTRAOCULAR LENS PLACEMENT ([place]) LEFT;  Surgeon: Jasmine Bear, MD;  Location: [place];  Service: Ophthalmology;  Laterality: Left;  4.32 0:39.7   CESAREAN SECTION     CHOLECYSTECTOMY     DILATION AND CURETTAGE OF UTERUS     ESOPHAGOGASTRODUODENOSCOPY N/A 02/22/2019   Procedure: ESOPHAGOGASTRODUODENOSCOPY (EGD);  Surgeon: Toledo, Benay Pike, MD;  Location: [facility];  Service: Gastroenterology;  Laterality: N/A;   FLEXIBLE SIGMOIDOSCOPY N/A 02/22/2019   Procedure: FLEXIBLE SIGMOIDOSCOPY;  Surgeon: Toledo, Benay Pike, MD;  Location: [facility];  Service: Gastroenterology;  Laterality: N/A;   HARDWARE REMOVAL Right 11/12/2019   Procedure: Right ankle hardware removal;  Surgeon: Jasmine Knows, MD;  Location: [facility];  Service: Orthopedics;   Laterality: Right;   HARDWARE REMOVAL Right 04/17/2020   Procedure: HARDWARE REMOVAL FROM RIGHT ANKLE AND RIGHT KNEE; IMPLANT OF CEMENT SPACERS IN RIGHT KNEE;  Surgeon: Jasmine Leep, MD;  Location: [facility];  Service: Orthopedics;  Laterality: Right;   JOINT REPLACEMENT     LEG AMPUTATION ABOVE KNEE Right 12/02/2020   [facility]   ORIF ANKLE FRACTURE Right 03/14/2019   Procedure: OPEN REDUCTION INTERNAL FIXATION (ORIF) ANKLE FRACTURE, MEDIAL MALLEOLUS;  Surgeon: Jasmine Knows, MD;  Location: [facility];  Service: Orthopedics;  Laterality: Right;   ORIF ANKLE FRACTURE Right 02/08/2019   Procedure: OPEN REDUCTION INTERNAL FIXATION (ORIF) ANKLE FRACTURE, post malleolus;  Surgeon: Jasmine Knows, MD;  Location: [facility];  Service: Orthopedics;  Laterality: Right;   PARTIAL HYSTERECTOMY  1993   REPLACEMENT TOTAL KNEE Bilateral 2008   2009   SCAR DEBRIDEMENT OF TOTAL KNEE  01/2020   SYNDESMOSIS REPAIR Right 02/08/2019   Procedure: SYNDESMOSIS REPAIR;  Surgeon: Jasmine Knows, MD;  Location: [facility];  Service: Orthopedics;  Laterality: Right;   TEE WITHOUT CARDIOVERSION N/A 02/26/2019   Procedure: TRANSESOPHAGEAL ECHOCARDIOGRAM (TEE);  Surgeon: Jasmine Skains, MD;  Location: [facility];  Service: Cardiovascular;  Laterality: N/A;   TOTAL KNEE REVISION WITH SCAR DEBRIDEMENT/PATELLA REVISION WITH POLY EXCHANGE Right 01/24/2020   Procedure: TOTAL KNEE REVISION WITH SCAR DEBRIDEMENT/PATELLA REVISION WITH POLY EXCHANGE;  Surgeon: Jasmine Leep, MD;  Location: [facility];  Service: Orthopedics;  Laterality: Right;  TUMOR REMOVAL     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   Patient Active Problem List   Diagnosis Date Noted   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[place]) 04/17/2020   Anxiety 04/17/2020   History of GI diverticular bleed 04/17/2020   Chest pain 04/17/2020   PAD (peripheral artery disease) ([place]) 04/05/2020   Ulcer of right leg ([place]) 04/05/2020   COPD (chronic obstructive pulmonary disease) ([place])  04/05/2020   GERD (gastroesophageal reflux disease) 04/05/2020   Abscess of right knee 01/22/2020   Pressure injury of skin 02/28/2019   SOB (shortness of breath)    Gastrointestinal hemorrhage    Sepsis ([place])    Symptomatic anemia 02/20/2019   Trimalleolar fracture of ankle, closed, right, initial encounter 02/07/2019   Multiple lung nodules 03/31/2014   Extrinsic asthma 03/27/2014   OSA on CPAP 03/27/2014    PCP: Jasmine Aus, MD  REFERRING PROVIDER: Rusty Aus, MD  REFERRING DIAG: Above knee amputation of right lower extremity  THERAPY DIAG:  S/P AKA (above knee amputation) unilateral, right (HCC)  Muscle weakness (generalized)  Gait difficulty  Abnormal posture  Rationale for Evaluation and Treatment Rehabilitation  ONSET DATE: 12/02/20  SUBJECTIVE:  EVALUATION  PERTINENT HISTORY: Patient Profile:   [name] is a 77 y.o. female Chief Complaint  Patient presents with  Visit Follow Up  Doing well.   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 Cardiac cath 10/2011 showed completely normal coronary arteries, however she did have mild left ventricular dysfunction with anterior wall motion abnormality. Left ventricular EF was 51%.  History of chickenpox  Hyperlipidemia  patient states she has never been diagnosed with this  Hypertension  Knee joint replacement by other  means  Macular degeneration  Major depressive disorder, recurrent, mild (CMS-HCC)  Medicare annual wellness visit, initial 08/09/2019  7/21  Multiple thyroid nodules  Normal coronary arteries 09/06/2013  Normal coronary anatomy by cardiac catheterization 10/12/11  Osteoarthritis  Ovarian cyst  PONV (postoperative nausea and vomiting)  nausea  Postoperative urinary retention 12/08/2020  Rash on lips 12/21/2020  Seasonal allergies  Sinusitis, unspecified  Sleep apnea  On CPAP.  Slow transit constipation 12/10/2020  Trimalleolar fracture of ankle, closed, right, initial encounter [id number]  Complicated by staph infection 1/21, 4 weeks IV Ancef, [place]  Ulcer   Past Surgical History:  Procedure Laterality Date  SALPINGO OOPHORECTOMY Bilateral 1993  Right total knee arthroplasty 10/11/2006  Dr Marry Guan  Left total knee arthroplasty 10/12/2007  Dr Marry Guan  COLONOSCOPY 07/30/2012  internal hemorrhoids, diverticulosis  ANKLE ARTHODESIS W/ ARTHROSCOPY Right 02/08/2019  03/14/2019 second surgery  COLONOSCOPY 02/22/2019  Blood in entire colon/Diverticulosis - Presumed diverticular bleed. No repeat recommended per TKT.  EGD 02/22/2019  Gastritis/gastric ulcers/Hiatal hernia/Otherwise normal - no repeat recommended per TKT.  ORIF ANKLE FRACTURE Right 03/14/2019  Dr. Rudene Christians  REMOVAL HARDWARE ANKLE FOOT/TOES Right 11/12/2019  Dr. Rudene Christians  Right knee arthrotomy, irrigation and debridement of the right knee, polyethylene exchange 01/24/2020  Dr Marry Guan  Removal of hardware (plates and screws) from the right ankle with irrigation, debridement, and placement of Stimulan antibiotic beads 04/17/2020  Dr Marry Guan  Right knee arthrotomy, extensive irrigation debridement, removal of right total knee implants, and placement of antibiotic impregnated polymethylmethacrylate cement spacer 04/17/2020  Dr Marry Guan  AMPUTATION LEG ABOVE KNEE AKA Right 12/02/2020  Procedure: AMPUTATION, THIGH, THROUGH FEMUR, ANY  LEVEL; Surgeon: Lyndle Herrlich, MD; Location: [place]; Service: Orthopedics; Laterality: Right;  TRANSFER ADJACENT TISSUE LEG Right 12/02/2020  Procedure: ADJACENT TISSUE TRANSFER OR REARRANGEMENT, LEG; DEFECT 10 SQ CM OR LESS; Surgeon: Lyndle Herrlich, MD; Location: [place]; Service: Orthopedics; Laterality: Right;  ASPIRATION/INJECTION MAJOR JOINT/BURSA KNEE Left 12/02/2020  Procedure: ARTHROCENTESIS, ASPIRATION AND/OR INJECTION, MAJOR JOINT OR BURSA, KNEE; WITHOUT ULTRASOUND GUIDANCE; Surgeon: Lyndle Herrlich, MD; Location: [place]; Service: Orthopedics; Laterality: Left;  REMOVAL KNEE PROSTHESIS W/POSSIBLE PLACEMENT SPACER Left 01/18/2021  Procedure: REMOVAL OF PROSTHESIS, INCLUDING TOTAL KNEE PROSTHESIS, METHYLMETHACRYLATE WITH OR WITHOUT INSERTION OF SPACER, KNEE; Surgeon: Ihor Dow, MD; Location: [place]; Service: Orthopedics; Laterality: Left;  INSERTION NON-BIODEGRADABLE DRUG DELIVERY IMPLANT Left 01/18/2021  Procedure: INSERTION, KNEE, bioresorbable, biodegradable, NON-BIODEGRADABLE DRUG DELIVERY IMPLANT; Surgeon: Ihor Dow, MD; Location: [place]; Service: Orthopedics; Laterality: Left;  ABOVE KNEE LEG AMPUTATION  12/02/2020  APPENDECTOMY Est 1968  CESAREAN SECTION  CHOLECYSTECTOMY  FRACTURE SURGERY 1/21  HYSTERECTOMY partial  JOINT REPLACEMENT 2008 & 2009  TUBAL LIGATION 3/79   ALLERGIES: Allergies  Allergen Reactions  Singulair [Montelukast] Other (See Comments)  Gland swelling  Sulfa (Sulfonamide Antibiotics) Swelling  Oxycodone Dizziness and Other (See Comments)  Vancomycin Analogues Other (See Comments)  Ringing in ears  Avinza [Morphine] Itching  Darvocet A500 [Propoxyphene N-Acetaminophen] Nausea  Nickel Rash  Ultracet [Tramadol-Acetaminophen] Rash  Vicodin [Hydrocodone-Acetaminophen] Vomiting  Vioxx [Rofecoxib] Other (See Comments)  GI   CURRENT MEDICATIONS: Current Outpatient Medications:  acetaminophen (TYLENOL) 500 MG tablet, Take 1,000 mg by mouth 3 (three) times a day Twice daily per patient., PRN Not Currently Taking aspirin 81 MG EC tablet, Take 1 tablet (81 mg total) by mouth at bedtime, Taking calcium carbonate-vitamin D3 (CALTRATE 600+D) 600 mg-10 mcg (400 unit) tablet, Take 0.5 tablets by mouth 2 (two) times daily, Taking cetirizine (ZYRTEC) 10 mg capsule, Take 1 capsule (10 mg total) by mouth once daily, Taking escitalopram oxalate (LEXAPRO) 10 MG tablet, Take 1 tablet (10 mg total) by mouth once daily, Taking estradioL (ESTRACE) 0.01 % (0.1 mg/gram) vaginal cream, Place 2 g vaginally twice a week, Taking folic acid/multivit,iron,miner (MULTIVIT-IRON-MIN-FOLIC ACID ORAL), Take 1 tablet by mouth once daily, Taking gabapentin (NEURONTIN) 300 MG capsule, Take 1 capsule (300 mg total) by mouth 3 (three) times daily Phantom limb pain, Taking melatonin 3 mg tablet, Take 1 tablet (3 mg total) by mouth at bedtime, Taking multivitamin with iron (COMPLETE MULTIVITAMIN-MINERAL) tablet, Take 1 tablet by mouth, Taking multivitamin with minerals, EYE, (PRESERVISION AREDS 2) soft gel capsule, Take 1 capsule by mouth 2 (two) times daily, Taking nystatin (MYCOSTATIN) 100,000 unit/gram powder, Apply small amount topically twice daily for rash under breasts, PRN Not Currently Taking pantoprazole (PROTONIX) 40 MG DR tablet, Take 1 tablet (40 mg total) by mouth 2 (two) times daily. Please call to schedule an appt. Thanks!, Taking polyethylene glycol (MIRALAX) powder, Take 17 g by mouth once daily Mix in 4-8ounces of fluid prior to taking., Taking sennosides-docusate (SENOKOT-S) 8.6-50 mg tablet, Take 1 tablet by mouth 2 (two) times daily For constipation, Taking lactose-reduced  food (ENSURE PLUS ORAL), Take 237 mLs by mouth 2 (two) times daily With breakfast and dinner  HPI   CLINICAL SUMMARY:  Patient post right AKA and working with prosthesis. 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 Able to do housework, improving  PAIN:  Are you having pain? Yes: NPRS scale: 5/10 Pain location: L knee Pain description: aching Aggravating factors: walking Relieving factors: rest  PRECAUTIONS: None  WEIGHT BEARING RESTRICTIONS No  FALLS:  Has patient fallen in last 6 months? No  LIVING ENVIRONMENT: Lives with: lives with their spouse Lives in: House/apartment Stairs: No Has following equipment at home: Gilford Rile - 4 wheeled and Wheelchair (manual)  OCCUPATION: retired  PLOF: Requires assistive device for independence  PATIENT GOALS  improve standing tolerance/ pull up pants/ ambulate with [place] safely.     OBJECTIVE:   PATIENT SURVEYS:  FOTO initial 46/ goal 46.   11/2: 50  COGNITION:  Overall cognitive status: Within functional limits for tasks assessed     SENSATION: WFL  POSTURE: rounded shoulders, forward head, flexed trunk , and weight shift left  PALPATION: No tenderness along R distal residual limb.  Discussed phantom limb sensation.    LOWER EXTREMITY ROM:  B LE AROM WFL except L knee extension secondary to articulating spacer.  Did not assess L/R hip extension at this time.    LOWER EXTREMITY MMT:  MMT Right eval Left eval  Hip flexion 4/5 4/5  Hip extension    Hip abduction 4+/5 4+/5  Hip adduction 4+/5 4+/5  Hip internal rotation    Hip external rotation    Knee flexion N/A 4+/5  Knee extension N/A 4+/5  Ankle dorsiflexion  5/5  Ankle plantarflexion    Ankle inversion    Ankle eversion     (Blank rows = not tested)  GAIT: Distance walked: in gym/ //-bars Assistive device utilized: Environmental consultant - 2 wheeled Level of assistance: CGA Comments: Cuing for posture correction with mirror feedback.  Flexed posture/ heavy UE assist.     TODAY'S TREATMENT:  03/22/22  Subjective:  Pt. Reports no new complaints.  Pt stated: " I had my cataract surgery 10 day s ago. I haven't been walking much. I feel weak."     Objective:    There.ex.: 38 mins                  Transfer training W/C<-> Mat                  In prone AAROM to Hip ext to L, in side lying to B 3 x 30 secs each                  Bridges 3 x 20 secs with poor mat clearance on R side                             Gait training: 20 Mins  Amb. In gym/ hallway with use of RW and focus on step thru. Gait with focus on heel strike and skin care during and after the gait. Pt education about skin care.   Did not performed 2/2 pt weak.  Ascend/ descend stairs 2x with step to gait and no knee buckling issues today.  Heavy UE assist and focus on R knee mechanism control and preventing toe clipping on step.  Mod. Ind./ SBA with no PT assist at stairs.  Standing L/R  1st step touches with B UE assist on handrails.     PATIENT EDUCATION:  Education details: HEP/ gait and prosthetic training.  Person educated: Patient and Spouse Education method: Explanation, Demonstration, and Verbal cues Education comprehension: verbalized understanding, returned demonstration, and tactile cues required   HOME EXERCISE PROGRAM: Prone position hip stretches/ Standing marching at kitchen counter with w/c behind patient.      ASSESSMENT:  CLINICAL IMPRESSION: Pt. Presented to PT session with prosthesis and increased generalized weakness. PT focused don Mat exs to improve BLE strength in all planes . PT assessed B hips and found Normal Jt ROM with soft end feel. Pt continues to demonstrate weak Hip muscles and interstitial edema in residual leg. Pt. reported of pinching in R groin area 2/2 to liner folding. Pt educated to do thorough skin check after every prosthesis use. Today mild redness and a black spot ( one year old) noted in distal end of the Femoral end without pain.  Pt demonstrated good understanding.  Pt. Will benefit from skilled PT services to improve standing tolerance/ gait independence and ADL.    OBJECTIVE IMPAIRMENTS Abnormal gait, decreased activity  tolerance, decreased balance, decreased endurance, decreased mobility, difficulty walking, decreased ROM, decreased strength, decreased safety awareness, impaired flexibility, improper body mechanics, prosthetic dependency , and pain.   ACTIVITY LIMITATIONS carrying, lifting, standing, squatting, stairs, transfers, bed mobility, bathing, toileting, dressing, hygiene/grooming, and locomotion level  PARTICIPATION LIMITATIONS: cleaning, laundry, driving, shopping, community activity, and yard work  [place] Past/current experiences are also affecting patient's functional outcome.   REHAB POTENTIAL: Good  CLINICAL DECISION MAKING: Evolving/moderate complexity  EVALUATION COMPLEXITY: High   GOALS: Goals reviewed with patient Yes  SHORT TERM GOALS: Target date: 10/07/21 Pt. Able to don/doff R prosthetic leg independently to improve ADL/standing tolerance.  Baseline:  min. A to don/ doff prosthetic leg Goal status: Goal met   LONG TERM GOALS: Target date: 03/29/22  Pt. Will increase FOTO to 54 to improve functional mobility.  Baseline: initial FOTO 46.  11/2: 50 Goal status: Partially met  2.  Pt. Able to tolerate standing 10 minutes while pulling up pants with mod. I safely while wearing prosthetic leg to improve daily activities.  Baseline:  limited standing tolerance Goal status: Not met  3.  Pt. Will ambulate 200 feet with use of SPC and mod. I to promote safety with household tasks/ walking into bathroom.   Baseline:  amb. With RW and min. A for safety.  11/2:   Goal status: Not met   PLAN: PT FREQUENCY: 2x/week  PT DURATION: 8 weeks  PLANNED INTERVENTIONS: Therapeutic exercises, Therapeutic activity, Neuromuscular re-education, Balance training, Gait training, Patient/Family education, Self Care, Joint mobilization, Prosthetic training, DME instructions, and Manual therapy  PLAN FOR NEXT SESSION:  Progress gait/ discuss pts. Cataract surgery and ability to get on  surgical chair.   Joaquin Music PT DPT 6:03 PM,03/24/22

## 2022-03-29 ENCOUNTER — Ambulatory Visit: Payer: Medicare Other | Admitting: Physical Therapy

## 2022-03-29 DIAGNOSIS — R293 Abnormal posture: Secondary | ICD-10-CM

## 2022-03-29 DIAGNOSIS — Z89611 Acquired absence of right leg above knee: Secondary | ICD-10-CM

## 2022-03-29 DIAGNOSIS — M6281 Muscle weakness (generalized): Secondary | ICD-10-CM

## 2022-03-29 DIAGNOSIS — R269 Unspecified abnormalities of gait and mobility: Secondary | ICD-10-CM

## 2022-03-30 ENCOUNTER — Encounter: Payer: Self-pay | Admitting: Physical Therapy

## 2022-03-30 NOTE — Therapy (Signed)
OUTPATIENT PHYSICAL THERAPY LOWER EXTREMITY TREATMENT/RECERTIFICATION Physical Therapy Progress Note  Dates of reporting period  02/10/22  to  03/29/22  Patient Name: Jasmine Morrow MRN: [id number] DOB:06-Jul-1945, 77 y.o., female Today's Date: 03/30/2022    PT End of Session - 03/30/22 1431     Visit Number 40    Number of Visits 40    Date for PT Re-Evaluation 05/24/22    PT Start Time 1634    PT Stop Time 1733    PT Time Calculation (min) 59 min    Equipment Utilized During Treatment Gait belt             Past Medical History:  Diagnosis Date   Anemia    Anxiety    Asthma    seasonal    GERD (gastroesophageal reflux disease)    H/O above knee amputation, 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   Hypertension    PONV (postoperative nausea and vomiting)    Seasonal allergies    Sleep apnea    does not uses CPAP machine anymore    Wheelchair dependent    able to self transfer   Past Surgical History:  Procedure Laterality Date   APPENDECTOMY     CATARACT EXTRACTION W/PHACO Right 02/28/2022   Procedure: CATARACT EXTRACTION PHACO AND INTRAOCULAR LENS PLACEMENT (IOC) RIGHT  4.82  00:30.9;  Surgeon: Jasmine Bear, MD;  Location: [facility];  Service: Ophthalmology;  Laterality: Right;  sleep apnea   CATARACT EXTRACTION W/PHACO Left 03/14/2022   Procedure: CATARACT EXTRACTION PHACO AND INTRAOCULAR LENS PLACEMENT ([place]) LEFT;  Surgeon: Jasmine Bear, MD;  Location: [facility];  Service: Ophthalmology;  Laterality: Left;  4.32 0:39.7   CESAREAN SECTION     CHOLECYSTECTOMY     DILATION AND CURETTAGE OF UTERUS     ESOPHAGOGASTRODUODENOSCOPY N/A 02/22/2019   Procedure: ESOPHAGOGASTRODUODENOSCOPY (EGD);  Surgeon: Toledo, Benay Pike, MD;  Location: [facility];  Service: Gastroenterology;  Laterality: N/A;   FLEXIBLE SIGMOIDOSCOPY N/A 02/22/2019   Procedure: FLEXIBLE SIGMOIDOSCOPY;  Surgeon: Toledo, Benay Pike, MD;  Location: [facility];  Service:  Gastroenterology;  Laterality: N/A;   HARDWARE REMOVAL Right 11/12/2019   Procedure: Right ankle hardware removal;  Surgeon: Jasmine Knows, MD;  Location: [facility];  Service: Orthopedics;  Laterality: Right;   HARDWARE REMOVAL Right 04/17/2020   Procedure: HARDWARE REMOVAL FROM RIGHT ANKLE AND RIGHT KNEE; IMPLANT OF CEMENT SPACERS IN RIGHT KNEE;  Surgeon: Jasmine Leep, MD;  Location: [facility];  Service: Orthopedics;  Laterality: Right;   JOINT REPLACEMENT     LEG AMPUTATION ABOVE KNEE Right 12/02/2020   [facility]   ORIF ANKLE FRACTURE Right 03/14/2019   Procedure: OPEN REDUCTION INTERNAL FIXATION (ORIF) ANKLE FRACTURE, MEDIAL MALLEOLUS;  Surgeon: Jasmine Knows, MD;  Location: [facility];  Service: Orthopedics;  Laterality: Right;   ORIF ANKLE FRACTURE Right 02/08/2019   Procedure: OPEN REDUCTION INTERNAL FIXATION (ORIF) ANKLE FRACTURE, post malleolus;  Surgeon: Jasmine Knows, MD;  Location: [facility];  Service: Orthopedics;  Laterality: Right;   PARTIAL HYSTERECTOMY  1993   REPLACEMENT TOTAL KNEE Bilateral 2008   2009   SCAR DEBRIDEMENT OF TOTAL KNEE  01/2020   SYNDESMOSIS REPAIR Right 02/08/2019   Procedure: SYNDESMOSIS REPAIR;  Surgeon: Jasmine Knows, MD;  Location: [facility];  Service: Orthopedics;  Laterality: Right;   TEE WITHOUT CARDIOVERSION N/A 02/26/2019   Procedure: TRANSESOPHAGEAL ECHOCARDIOGRAM (TEE);  Surgeon: Jasmine Skains, MD;  Location: [facility];  Service: Cardiovascular;  Laterality: N/A;   TOTAL KNEE REVISION  WITH SCAR DEBRIDEMENT/PATELLA REVISION WITH POLY EXCHANGE Right 01/24/2020   Procedure: TOTAL KNEE REVISION WITH SCAR DEBRIDEMENT/PATELLA REVISION WITH POLY EXCHANGE;  Surgeon: Jasmine Leep, MD;  Location: [facility];  Service: Orthopedics;  Laterality: Right;   TUMOR REMOVAL     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   Patient Active Problem List   Diagnosis Date Noted   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[place]) 04/17/2020   Anxiety 04/17/2020   History of GI  diverticular bleed 04/17/2020   Chest pain 04/17/2020   PAD (peripheral artery disease) ([place]) 04/05/2020   Ulcer of right leg ([place]) 04/05/2020   COPD (chronic obstructive pulmonary disease) ([place]) 04/05/2020   GERD (gastroesophageal reflux disease) 04/05/2020   Abscess of right knee 01/22/2020   Pressure injury of skin 02/28/2019   SOB (shortness of breath)    Gastrointestinal hemorrhage    Sepsis ([place])    Symptomatic anemia 02/20/2019   Trimalleolar fracture of ankle, closed, right, initial encounter 02/07/2019   Multiple lung nodules 03/31/2014   Extrinsic asthma 03/27/2014   OSA on CPAP 03/27/2014    PCP: Jasmine Aus, MD  REFERRING PROVIDER: Rusty Aus, MD  REFERRING DIAG: Above knee amputation of right lower extremity  THERAPY DIAG:  S/P AKA (above knee amputation) unilateral, right (HCC)  Muscle weakness (generalized)  Gait difficulty  Abnormal posture  Rationale for Evaluation and Treatment Rehabilitation  ONSET DATE: 12/02/20  SUBJECTIVE:  EVALUATION  PERTINENT HISTORY: Patient Profile:   Jasmine Morrow is a 77 y.o. female Chief Complaint  Patient presents with  Visit Follow Up  Doing well.   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 Cardiac cath 10/2011 showed completely normal coronary arteries, however she did have mild left ventricular dysfunction with anterior  wall motion abnormality. Left ventricular EF was 51%.  History of chickenpox  Hyperlipidemia  patient states she has never been diagnosed with this  Hypertension  Knee joint replacement by other means  Macular degeneration  Major depressive disorder, recurrent, mild (CMS-HCC)  Medicare annual wellness visit, initial 08/09/2019  7/21  Multiple thyroid nodules  Normal coronary arteries 09/06/2013  Normal coronary anatomy by cardiac catheterization 10/12/11  Osteoarthritis  Ovarian cyst  PONV (postoperative nausea and vomiting)  nausea  Postoperative urinary retention 12/08/2020  Rash on lips 12/21/2020  Seasonal allergies  Sinusitis, unspecified  Sleep apnea  On CPAP.  Slow transit constipation 12/10/2020  Trimalleolar fracture of ankle, closed, right, initial encounter [id number]  Complicated by staph infection 1/21, 4 weeks IV Ancef, [facility]  Ulcer   Past Surgical History:  Procedure Laterality Date  SALPINGO OOPHORECTOMY Bilateral 1993  Right total knee arthroplasty 10/11/2006  Dr Marry Guan  Left total knee arthroplasty 10/12/2007  Dr Marry Guan  COLONOSCOPY 07/30/2012  internal hemorrhoids, diverticulosis  ANKLE ARTHODESIS W/ ARTHROSCOPY Right 02/08/2019  03/14/2019 second surgery  COLONOSCOPY 02/22/2019  Blood in entire colon/Diverticulosis - Presumed diverticular bleed. No repeat recommended per TKT.  EGD 02/22/2019  Gastritis/gastric ulcers/Hiatal hernia/Otherwise  normal - no repeat recommended per TKT.  ORIF ANKLE FRACTURE Right 03/14/2019  Dr. Rudene Christians  REMOVAL HARDWARE ANKLE FOOT/TOES Right 11/12/2019  Dr. Rudene Christians  Right knee arthrotomy, irrigation and debridement of the right knee, polyethylene exchange 01/24/2020  Dr Marry Guan  Removal of hardware (plates and screws) from the right ankle with irrigation, debridement, and placement of Stimulan antibiotic beads 04/17/2020  Dr Marry Guan  Right knee arthrotomy, extensive irrigation debridement, removal of right total knee implants, and  placement of antibiotic impregnated polymethylmethacrylate cement spacer 04/17/2020  Dr Marry Guan  AMPUTATION LEG ABOVE KNEE AKA Right 12/02/2020  Procedure: AMPUTATION, THIGH, THROUGH FEMUR, ANY LEVEL; Surgeon: Lyndle Herrlich, MD; Location: [facility]; Service: Orthopedics; Laterality: Right;  TRANSFER ADJACENT TISSUE LEG Right 12/02/2020  Procedure: ADJACENT TISSUE TRANSFER OR REARRANGEMENT, LEG; DEFECT 10 SQ CM OR LESS; Surgeon: Lyndle Herrlich, MD; Location: [place]; Service: Orthopedics; Laterality: Right;  ASPIRATION/INJECTION MAJOR JOINT/BURSA KNEE Left 12/02/2020  Procedure: ARTHROCENTESIS, ASPIRATION AND/OR INJECTION, MAJOR JOINT OR BURSA, KNEE; WITHOUT ULTRASOUND GUIDANCE; Surgeon: Lyndle Herrlich, MD; Location: [place]; Service: Orthopedics; Laterality: Left;  REMOVAL KNEE PROSTHESIS W/POSSIBLE PLACEMENT SPACER Left 01/18/2021  Procedure: REMOVAL OF PROSTHESIS, INCLUDING TOTAL KNEE PROSTHESIS, METHYLMETHACRYLATE WITH OR WITHOUT INSERTION OF SPACER, KNEE; Surgeon: Ihor Dow, MD; Location: [facility]; Service: Orthopedics; Laterality: Left;  INSERTION NON-BIODEGRADABLE DRUG DELIVERY IMPLANT Left 01/18/2021  Procedure: INSERTION, KNEE, bioresorbable, biodegradable, NON-BIODEGRADABLE DRUG DELIVERY IMPLANT; Surgeon: Ihor Dow, MD; Location: [facility]; Service: Orthopedics; Laterality: Left;  ABOVE KNEE LEG AMPUTATION  12/02/2020  APPENDECTOMY Est 1968  CESAREAN SECTION  CHOLECYSTECTOMY  FRACTURE SURGERY 1/21  HYSTERECTOMY partial  JOINT REPLACEMENT 2008 & 2009  TUBAL LIGATION 3/79   ALLERGIES: Allergies  Allergen Reactions  Singulair [Montelukast] Other (See Comments)  Gland swelling  Sulfa (Sulfonamide Antibiotics) Swelling  Oxycodone Dizziness and Other (See Comments)  Vancomycin Analogues Other (See Comments)  Ringing in ears  Avinza [Morphine] Itching  Darvocet A500 [Propoxyphene N-Acetaminophen] Nausea  Nickel  Rash  Ultracet [Tramadol-Acetaminophen] Rash  Vicodin [Hydrocodone-Acetaminophen] Vomiting  Vioxx [Rofecoxib] Other (See Comments)  GI   CURRENT MEDICATIONS: Current Outpatient Medications: acetaminophen (TYLENOL) 500 MG tablet, Take 1,000 mg by mouth 3 (three) times a day Twice daily per patient., PRN Not Currently Taking aspirin 81 MG EC tablet, Take 1 tablet (81 mg total) by mouth at bedtime, Taking calcium carbonate-vitamin D3 (CALTRATE 600+D) 600 mg-10 mcg (400 unit) tablet, Take 0.5 tablets by mouth 2 (two) times daily, Taking cetirizine (ZYRTEC) 10 mg capsule, Take 1 capsule (10 mg total) by mouth once daily, Taking escitalopram oxalate (LEXAPRO) 10 MG tablet, Take 1 tablet (10 mg total) by mouth once daily, Taking estradioL (ESTRACE) 0.01 % (0.1 mg/gram) vaginal cream, Place 2 g vaginally twice a week, Taking folic acid/multivit,iron,miner (MULTIVIT-IRON-MIN-FOLIC ACID ORAL), Take 1 tablet by mouth once daily, Taking gabapentin (NEURONTIN) 300 MG capsule, Take 1 capsule (300 mg total) by mouth 3 (three) times daily Phantom limb pain, Taking melatonin 3 mg tablet, Take 1 tablet (3 mg total) by mouth at bedtime, Taking multivitamin with iron (COMPLETE MULTIVITAMIN-MINERAL) tablet, Take 1 tablet by mouth, Taking multivitamin with minerals, EYE, (PRESERVISION AREDS 2) soft gel capsule, Take 1 capsule by mouth 2 (two) times daily, Taking nystatin (MYCOSTATIN) 100,000 unit/gram powder, Apply small amount topically twice daily for rash under breasts, PRN Not Currently Taking pantoprazole (PROTONIX) 40 MG DR tablet, Take 1 tablet (40 mg total) by mouth 2 (two) times daily. Please call to schedule an  appt. Thanks!, Taking polyethylene glycol (MIRALAX) powder, Take 17 g by mouth once daily Mix in 4-8ounces of fluid prior to taking., Taking sennosides-docusate (SENOKOT-S) 8.6-50 mg tablet, Take 1 tablet by mouth 2 (two) times daily For constipation, Taking lactose-reduced food (ENSURE PLUS ORAL),  Take 237 mLs by mouth 2 (two) times daily With breakfast and dinner  HPI   CLINICAL SUMMARY:  Patient post right AKA and working with prosthesis. 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 Able to do housework, improving  PAIN:  Are you having pain? Yes: NPRS scale: 5/10 Pain location: L knee Pain description: aching Aggravating factors: walking Relieving factors: rest  PRECAUTIONS: None  WEIGHT BEARING RESTRICTIONS No  FALLS:  Has patient fallen in last 6 months? No  LIVING ENVIRONMENT: Lives with: lives with their spouse Lives in: House/apartment Stairs: No Has following equipment at home: Gilford Rile - 4 wheeled and Wheelchair (manual)  OCCUPATION: retired  PLOF: Requires assistive device for independence  PATIENT GOALS  improve standing tolerance/ pull up pants/ ambulate with [place] safely.     OBJECTIVE:   PATIENT SURVEYS:  FOTO initial 46/ goal 49.   11/2: 50  COGNITION:  Overall cognitive status: Within functional limits for tasks assessed     SENSATION: WFL  POSTURE: rounded shoulders, forward head, flexed trunk , and weight shift left  PALPATION: No tenderness along R distal residual limb.  Discussed phantom limb sensation.    LOWER EXTREMITY ROM:  B LE AROM WFL except L knee extension secondary to articulating spacer.  Did not assess L/R hip extension at this time.    LOWER EXTREMITY MMT:  MMT Right eval Left eval  Hip flexion 4/5 4/5  Hip extension    Hip abduction 4+/5 4+/5  Hip adduction 4+/5 4+/5  Hip internal rotation    Hip external rotation    Knee flexion N/A 4+/5  Knee extension N/A 4+/5  Ankle dorsiflexion  5/5  Ankle plantarflexion    Ankle inversion    Ankle eversion     (Blank rows = not tested)  GAIT: Distance walked: in gym/ //-bars Assistive device utilized: Environmental consultant - 2 wheeled Level of assistance: CGA Comments: Cuing for posture correction with mirror feedback.  Flexed posture/ heavy UE assist.      TODAY'S TREATMENT:  03/29/22  Subjective:  Pt. Reports a marked increase in back pain after attempting prone hip extension stretches.  Pt. Reports no back pain at this time and has difficulty with hip stretches at home.    Objective:   There.ex.:    Nustep L4 10 min. B UE/LE (no UE for last 3 minutes)- warm-up prior to gait.  Improvement in prosthetic fit after Nustep.     Neuro:     Sit to stands from w/c with and without pad in chair with B UE assist required.  Standing wt. Shifting in front of //-bars and mirror without UE assist.             Gait training:    Amb. In gym/ hallway with use of RW and focus on step through gait. and heel strike during and after the gait. Pt education about skin care.   Ascend/ descend stairs 2x with step to gait and heavy UE assist and focus on R knee mechanism control and preventing toe clipping on step.  Mod. Ind./ SBA with no PT assist at stairs.    Walking from //-bars to car in front of clinic with consistent recip  step pattern/ good R knee mechanism control.     PATIENT EDUCATION:  Education details: HEP/ gait and prosthetic training.  Person educated: Patient and Spouse Education method: Explanation, Demonstration, and Verbal cues Education comprehension: verbalized understanding, returned demonstration, and tactile cues required   HOME EXERCISE PROGRAM: Prone position hip stretches/ Standing marching at kitchen counter with w/c behind patient.      ASSESSMENT:  CLINICAL IMPRESSION: Pt. Presents to PT in w/c and brought prosthetic leg.  Pt. Has not walked since last PT visit and is feeling better today.  Pt. Is still having vision issues since cataract surgery.  PT focused don Mat exs to improve BLE strength in all planes .  Pt. reported of pinching in R groin area 2/2 to liner folding. Pt demonstrated good understanding of standing tasks/ wt. shifting.  Pt. Will benefit from skilled PT services to improve standing tolerance/ gait  independence and ADL.    OBJECTIVE IMPAIRMENTS Abnormal gait, decreased activity tolerance, decreased balance, decreased endurance, decreased mobility, difficulty walking, decreased ROM, decreased strength, decreased safety awareness, impaired flexibility, improper body mechanics, prosthetic dependency , and pain.   ACTIVITY LIMITATIONS carrying, lifting, standing, squatting, stairs, transfers, bed mobility, bathing, toileting, dressing, hygiene/grooming, and locomotion level  PARTICIPATION LIMITATIONS: cleaning, laundry, driving, shopping, community activity, and yard work  [place] Past/current experiences are also affecting patient's functional outcome.   REHAB POTENTIAL: Good  CLINICAL DECISION MAKING: Evolving/moderate complexity  EVALUATION COMPLEXITY: High   GOALS: Goals reviewed with patient Yes  LONG TERM GOALS: Target date: 05/24/22  Pt. Will increase FOTO to 54 to improve functional mobility.  Baseline: initial FOTO 46.  11/2: 50 Goal status: Partially met  2.  Pt. Able to tolerate standing 10 minutes while pulling up pants with mod. I safely while wearing prosthetic leg to improve daily activities.  Baseline:  limited standing tolerance Goal status: Not met  3.  Pt. Will ambulate 200 feet with use of SPC and mod. I to promote safety with household tasks/ walking into bathroom.   Baseline:  amb. With RW and min. A for safety.  2/27: walking in clinic with SBA/mod. I for 100 feet with RW and use of prosthetic leg  Goal status: Not met  4.  Pt. Will ascend 10 stairs with use of single forearm crutch/ handrail and step to pattern with mod. I safely.  Baseline:  see above Goal status: Initial    PLAN: PT FREQUENCY: 2x/week  PT DURATION: 8 weeks  PLANNED INTERVENTIONS: Therapeutic exercises, Therapeutic activity, Neuromuscular re-education, Balance training, Gait training, Patient/Family education, Self Care, Joint mobilization, Prosthetic training, DME  instructions, and Manual therapy  PLAN FOR NEXT SESSION:  Progress gait/ standing tolerance.   Pura Spice, PT, DPT # [phone] 2:34 PM,03/30/22

## 2022-03-31 ENCOUNTER — Encounter: Payer: Self-pay | Admitting: Physical Therapy

## 2022-03-31 ENCOUNTER — Ambulatory Visit: Payer: Medicare Other | Admitting: Physical Therapy

## 2022-03-31 DIAGNOSIS — R293 Abnormal posture: Secondary | ICD-10-CM

## 2022-03-31 DIAGNOSIS — R269 Unspecified abnormalities of gait and mobility: Secondary | ICD-10-CM

## 2022-03-31 DIAGNOSIS — Z89611 Acquired absence of right leg above knee: Secondary | ICD-10-CM

## 2022-03-31 DIAGNOSIS — M6281 Muscle weakness (generalized): Secondary | ICD-10-CM

## 2022-03-31 NOTE — Therapy (Signed)
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   Hypertension    PONV (postoperative nausea and vomiting)    Seasonal allergies    Sleep apnea    does not uses CPAP machine anymore    Wheelchair dependent    able to self transfer   Past Surgical History:  Procedure Laterality Date   APPENDECTOMY     CATARACT EXTRACTION W/PHACO Right 02/28/2022   Procedure: CATARACT EXTRACTION PHACO AND INTRAOCULAR LENS PLACEMENT ([facility]) RIGHT  4.82  00:30.9;  Surgeon: Eulogio Bear, MD;  Location: [place];  Service: Ophthalmology;  Laterality: Right;  sleep apnea   CATARACT EXTRACTION W/PHACO Left 03/14/2022   Procedure: CATARACT EXTRACTION PHACO AND INTRAOCULAR LENS PLACEMENT ([place]) LEFT;  Surgeon: Eulogio Bear, MD;  Location: [place];  Service: Ophthalmology;  Laterality: Left;  4.32 0:39.7   CESAREAN SECTION     CHOLECYSTECTOMY     DILATION AND CURETTAGE OF UTERUS     ESOPHAGOGASTRODUODENOSCOPY N/A 02/22/2019   Procedure: ESOPHAGOGASTRODUODENOSCOPY (EGD);  Surgeon: Toledo, Benay Pike, MD;  Location: [facility];  Service: Gastroenterology;  Laterality: N/A;   FLEXIBLE SIGMOIDOSCOPY N/A 02/22/2019   Procedure: FLEXIBLE SIGMOIDOSCOPY;  Surgeon: Toledo, Benay Pike, MD;  Location: [facility];  Service: Gastroenterology;  Laterality: N/A;   HARDWARE REMOVAL Right 11/12/2019   Procedure: Right ankle hardware  removal;  Surgeon: Hessie Knows, MD;  Location: [facility];  Service: Orthopedics;  Laterality: Right;   HARDWARE REMOVAL Right 04/17/2020   Procedure: HARDWARE REMOVAL FROM RIGHT ANKLE AND RIGHT KNEE; IMPLANT OF CEMENT SPACERS IN RIGHT KNEE;  Surgeon: Dereck Leep, MD;  Location: [facility];  Service: Orthopedics;  Laterality: Right;   JOINT REPLACEMENT     LEG AMPUTATION ABOVE KNEE Right 12/02/2020   [facility]   ORIF ANKLE FRACTURE Right 03/14/2019   Procedure: OPEN REDUCTION INTERNAL FIXATION (ORIF) ANKLE FRACTURE, MEDIAL MALLEOLUS;  Surgeon: Hessie Knows, MD;  Location: [facility];  Service: Orthopedics;  Laterality: Right;   ORIF ANKLE FRACTURE Right 02/08/2019   Procedure: OPEN REDUCTION INTERNAL FIXATION (ORIF) ANKLE FRACTURE, post malleolus;  Surgeon: Hessie Knows, MD;  Location: [facility];  Service: Orthopedics;  Laterality: Right;   PARTIAL HYSTERECTOMY  1993   REPLACEMENT TOTAL KNEE Bilateral 2008   2009   SCAR DEBRIDEMENT OF TOTAL KNEE  01/2020   SYNDESMOSIS REPAIR Right 02/08/2019   Procedure: SYNDESMOSIS REPAIR;  Surgeon: Hessie Knows, MD;  Location: [facility];  Service: Orthopedics;  Laterality: Right;   TEE WITHOUT CARDIOVERSION N/A 02/26/2019   Procedure: TRANSESOPHAGEAL ECHOCARDIOGRAM (TEE);  Surgeon: Corey Skains, MD;  Location: [facility];  Service: Cardiovascular;  Laterality: N/A;   TOTAL KNEE REVISION WITH SCAR DEBRIDEMENT/PATELLA REVISION WITH POLY EXCHANGE Right 01/24/2020   Procedure: TOTAL KNEE REVISION  WITH SCAR DEBRIDEMENT/PATELLA REVISION WITH POLY EXCHANGE;  Surgeon: Dereck Leep, MD;  Location: [facility];  Service: Orthopedics;  Laterality: Right;   TUMOR REMOVAL     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   Patient Active Problem List   Diagnosis Date Noted   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[place]) 04/17/2020   Anxiety 04/17/2020   History of GI diverticular bleed 04/17/2020   Chest pain 04/17/2020   PAD (peripheral artery disease) ([place]) 04/05/2020   Ulcer  of right leg ([place]) 04/05/2020   COPD (chronic obstructive pulmonary disease) ([place]) 04/05/2020   GERD (gastroesophageal reflux disease) 04/05/2020   Abscess of right knee 01/22/2020   Pressure injury of skin 02/28/2019   SOB (shortness of breath)    Gastrointestinal hemorrhage    Sepsis ([place])    Symptomatic anemia 02/20/2019   Trimalleolar fracture of ankle, closed, right, initial encounter 02/07/2019   Multiple lung nodules 03/31/2014   Extrinsic asthma 03/27/2014   OSA on CPAP 03/27/2014    PCP: Rusty Aus, MD  REFERRING PROVIDER: Rusty Aus, MD  REFERRING DIAG: Above knee amputation of right lower extremity  THERAPY DIAG:  S/P AKA (above knee amputation) unilateral, right (HCC)  Muscle weakness (generalized)  Gait difficulty  Abnormal posture  Rationale for Evaluation and Treatment Rehabilitation  ONSET DATE: 12/02/20  SUBJECTIVE:  EVALUATION  PERTINENT HISTORY: Patient Profile:   Jasmine Morrow is a 77 y.o. female Chief Complaint  Patient presents with  Visit Follow Up  Doing well.   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 Cardiac cath 10/2011 showed completely normal coronary arteries, however she did have mild left ventricular dysfunction with anterior wall motion abnormality. Left ventricular EF was 51%.  History of chickenpox  Hyperlipidemia  patient states she  has never been diagnosed with this  Hypertension  Knee joint replacement by other means  Macular degeneration  Major depressive disorder, recurrent, mild (CMS-HCC)  Medicare annual wellness visit, initial 08/09/2019  7/21  Multiple thyroid nodules  Normal coronary arteries 09/06/2013  Normal coronary anatomy by cardiac catheterization 10/12/11  Osteoarthritis  Ovarian cyst  PONV (postoperative nausea and vomiting)  nausea  Postoperative urinary retention 12/08/2020  Rash on lips 12/21/2020  Seasonal allergies  Sinusitis, unspecified  Sleep apnea  On CPAP.  Slow transit constipation 12/10/2020  Trimalleolar fracture of ankle, closed, right, initial encounter [id number]  Complicated by staph infection 1/21, 4 weeks IV Ancef, Menz  Ulcer   Past Surgical History:  Procedure Laterality Date  SALPINGO OOPHORECTOMY Bilateral 1993  Right total knee arthroplasty 10/11/2006  Dr Marry Guan  Left total knee arthroplasty 10/12/2007  Dr Marry Guan  COLONOSCOPY 07/30/2012  internal hemorrhoids, diverticulosis  ANKLE ARTHODESIS W/ ARTHROSCOPY Right 02/08/2019  03/14/2019 second surgery  COLONOSCOPY 02/22/2019  Blood in entire colon/Diverticulosis - Presumed diverticular bleed. No repeat recommended per TKT.  EGD 02/22/2019  Gastritis/gastric ulcers/Hiatal hernia/Otherwise normal - no repeat recommended per TKT.  ORIF ANKLE FRACTURE Right 03/14/2019  Dr.  Menz  REMOVAL HARDWARE ANKLE FOOT/TOES Right 11/12/2019  Dr. Rudene Christians  Right knee arthrotomy, irrigation and debridement of the right knee, polyethylene exchange 01/24/2020  Dr Marry Guan  Removal of hardware (plates and screws) from the right ankle with irrigation, debridement, and placement of Stimulan antibiotic beads 04/17/2020  Dr Marry Guan  Right knee arthrotomy, extensive irrigation debridement, removal of right total knee implants, and placement of antibiotic impregnated polymethylmethacrylate cement spacer 04/17/2020  Dr Marry Guan  AMPUTATION LEG  ABOVE KNEE AKA Right 12/02/2020  Procedure: AMPUTATION, THIGH, THROUGH FEMUR, ANY LEVEL; Surgeon: Lyndle Herrlich, MD; Location: [place]; Service: Orthopedics; Laterality: Right;  TRANSFER ADJACENT TISSUE LEG Right 12/02/2020  Procedure: ADJACENT TISSUE TRANSFER OR REARRANGEMENT, LEG; DEFECT 10 SQ CM OR LESS; Surgeon: Lyndle Herrlich, MD; Location: [place]; Service: Orthopedics; Laterality: Right;  ASPIRATION/INJECTION MAJOR JOINT/BURSA KNEE Left 12/02/2020  Procedure: ARTHROCENTESIS, ASPIRATION AND/OR INJECTION, MAJOR JOINT OR BURSA, KNEE; WITHOUT ULTRASOUND GUIDANCE; Surgeon: Lyndle Herrlich, MD; Location: [place]; Service: Orthopedics; Laterality: Left;  REMOVAL KNEE PROSTHESIS W/POSSIBLE PLACEMENT SPACER Left 01/18/2021  Procedure: REMOVAL OF PROSTHESIS, INCLUDING TOTAL KNEE PROSTHESIS, METHYLMETHACRYLATE WITH OR WITHOUT INSERTION OF SPACER, KNEE; Surgeon: Ihor Dow, MD; Location: [place]; Service: Orthopedics; Laterality: Left;  INSERTION NON-BIODEGRADABLE DRUG DELIVERY IMPLANT Left 01/18/2021  Procedure: INSERTION, KNEE, bioresorbable, biodegradable, NON-BIODEGRADABLE DRUG DELIVERY IMPLANT; Surgeon: Ihor Dow, MD; Location: [place]; Service: Orthopedics; Laterality: Left;  ABOVE KNEE LEG AMPUTATION  12/02/2020  APPENDECTOMY Est 1968  CESAREAN SECTION  CHOLECYSTECTOMY  FRACTURE SURGERY 1/21  HYSTERECTOMY partial  JOINT REPLACEMENT 2008 & 2009  TUBAL LIGATION 3/79   ALLERGIES: Allergies  Allergen Reactions  Singulair [Montelukast] Other (See Comments)  Gland swelling  Sulfa (Sulfonamide Antibiotics) Swelling  Oxycodone Dizziness and Other (See Comments)  Vancomycin Analogues Other (See Comments)  Ringing in ears  Avinza [Morphine] Itching  Darvocet A500 [Propoxyphene N-Acetaminophen] Nausea  Nickel Rash  Ultracet [Tramadol-Acetaminophen] Rash  Vicodin [Hydrocodone-Acetaminophen] Vomiting  Vioxx [Rofecoxib]  Other (See Comments)  GI   CURRENT MEDICATIONS: Current Outpatient Medications: acetaminophen (TYLENOL) 500 MG tablet, Take 1,000 mg by mouth 3 (three) times a day Twice daily per patient., PRN Not Currently Taking aspirin 81 MG EC tablet, Take 1 tablet (81 mg total) by mouth at bedtime, Taking calcium carbonate-vitamin D3 (CALTRATE 600+D) 600 mg-10 mcg (400 unit) tablet, Take 0.5 tablets by mouth 2 (two) times daily, Taking cetirizine (ZYRTEC) 10 mg capsule, Take 1 capsule (10 mg total) by mouth once daily, Taking escitalopram oxalate (LEXAPRO) 10 MG tablet, Take 1 tablet (10 mg total) by mouth once daily, Taking estradioL (ESTRACE) 0.01 % (0.1 mg/gram) vaginal cream, Place 2 g vaginally twice a week, Taking folic acid/multivit,iron,miner (MULTIVIT-IRON-MIN-FOLIC ACID ORAL), Take 1 tablet by mouth once daily, Taking gabapentin (NEURONTIN) 300 MG capsule, Take 1 capsule (300 mg total) by mouth 3 (three) times daily Phantom limb pain, Taking melatonin 3 mg tablet, Take 1 tablet (3 mg total) by mouth at bedtime, Taking multivitamin with iron (COMPLETE MULTIVITAMIN-MINERAL) tablet, Take 1 tablet by mouth, Taking multivitamin with minerals, EYE, (PRESERVISION AREDS 2) soft gel capsule, Take 1 capsule by mouth 2 (two) times daily, Taking nystatin (MYCOSTATIN) 100,000 unit/gram powder, Apply small amount topically twice daily for rash under breasts, PRN Not Currently Taking pantoprazole (PROTONIX) 40 MG DR tablet, Take 1 tablet (40 mg total) by mouth 2 (two) times daily. Please call to schedule an appt. Thanks!, Taking polyethylene glycol (MIRALAX) powder, Take 17 g by mouth once daily Mix  in 4-8ounces of fluid prior to taking., Taking sennosides-docusate (SENOKOT-S) 8.6-50 mg tablet, Take 1 tablet by mouth 2 (two) times daily For constipation, Taking lactose-reduced food (ENSURE PLUS ORAL), Take 237 mLs by mouth 2 (two) times daily With breakfast and dinner  HPI   CLINICAL SUMMARY:  Patient post  right AKA and working with prosthesis. 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 Able to do housework, improving  PAIN:  Are you having pain? Yes: NPRS scale: 5/10 Pain location: L knee Pain description: aching Aggravating factors: walking Relieving factors: rest  PRECAUTIONS: None  WEIGHT BEARING RESTRICTIONS No  FALLS:  Has patient fallen in last 6 months? No  LIVING ENVIRONMENT: Lives with: lives with their spouse Lives in: House/apartment Stairs: No Has following equipment at home: Gilford Rile - 4 wheeled and Wheelchair (manual)  OCCUPATION: retired  PLOF: Requires assistive device for independence  PATIENT GOALS  improve standing tolerance/ pull up pants/ ambulate with [place] safely.     OBJECTIVE:   PATIENT SURVEYS:  FOTO initial 46/ goal 40.   11/2: 50  COGNITION:  Overall cognitive status: Within functional limits for tasks assessed     SENSATION: WFL  POSTURE: rounded shoulders, forward head, flexed trunk , and weight shift left  PALPATION: No tenderness along R distal residual limb.  Discussed phantom limb sensation.    LOWER EXTREMITY ROM:  B LE AROM WFL except L knee extension secondary to articulating spacer.  Did not assess L/R hip extension at this time.    LOWER EXTREMITY MMT:  MMT Right eval Left eval  Hip flexion 4/5 4/5  Hip extension    Hip abduction 4+/5 4+/5  Hip adduction 4+/5 4+/5  Hip internal rotation    Hip external rotation    Knee flexion N/A 4+/5  Knee extension N/A 4+/5  Ankle dorsiflexion  5/5  Ankle plantarflexion    Ankle inversion    Ankle eversion     (Blank rows = not tested)  GAIT: Distance walked: in gym/ //-bars Assistive device utilized: Environmental consultant - 2 wheeled Level of assistance: CGA Comments: Cuing for posture correction with mirror feedback.  Flexed posture/ heavy UE assist.     TODAY'S TREATMENT:  03/31/22  Subjective:  Pt. Reports no new complaints.  Pt. Arrived in w/c and ready  to work on gait/ standing balance.     Objective:   There.ex.:    Nustep L5 10 min. L UE/B LE warm-up prior to gait.  Improvement in prosthetic fit after Nustep.     Neuro:     Sit to stands from w/c with and without pad in chair with B UE assist required.  Standing wt. Shifting in front of //-bars and mirror without UE assist.  Added functional reaching to cones with SPT assist.              Gait training:    Amb. In gym/ hallway with use of RW and focus on step through gait. and heel strike during and after the gait.  Cuing to increase R LE step length while maintaining consistent BOS/ heel strike.  Forward/backward/lateral walking in //-bars 4 laps each, except lateral walking 2 laps.  Ascend/ descend 6" step in //-bars with heavy UE assist and focus on R knee mechanism control and preventing toe clipping on step.  Mod. Ind./ SBA with no PT assist at stairs.    Walking from //-bars to car in front of clinic with consistent recip step pattern/ good  R knee mechanism control.     PATIENT EDUCATION:  Education details: HEP/ gait and prosthetic training.  Person educated: Patient and Spouse Education method: Explanation, Demonstration, and Verbal cues Education comprehension: verbalized understanding, returned demonstration, and tactile cues required   HOME EXERCISE PROGRAM: Prone position hip stretches/ Standing marching at kitchen counter with w/c behind patient.      ASSESSMENT:  CLINICAL IMPRESSION: Pt. Presents to PT in w/c and brought prosthetic leg.   Pt. Is still having vision issues since cataract surgery.  CGA/min. A with functional reaching without UE assist in //-bars (no increase c/o LE pain).  Pt. Tends to lean posteriorly when returning to upright posture after forward reaching.   Pt. Works hard during step ups and gait in gym and focused on more consistent upright Leisure centre manager.  Pt. Will benefit from skilled PT services to improve standing tolerance/ gait  independence and ADL.    OBJECTIVE IMPAIRMENTS Abnormal gait, decreased activity tolerance, decreased balance, decreased endurance, decreased mobility, difficulty walking, decreased ROM, decreased strength, decreased safety awareness, impaired flexibility, improper body mechanics, prosthetic dependency , and pain.   ACTIVITY LIMITATIONS carrying, lifting, standing, squatting, stairs, transfers, bed mobility, bathing, toileting, dressing, hygiene/grooming, and locomotion level  PARTICIPATION LIMITATIONS: cleaning, laundry, driving, shopping, community activity, and yard work  [place] Past/current experiences are also affecting patient's functional outcome.   REHAB POTENTIAL: Good  CLINICAL DECISION MAKING: Evolving/moderate complexity  EVALUATION COMPLEXITY: High   GOALS: Goals reviewed with patient Yes  LONG TERM GOALS: Target date: 05/24/22  Pt. Will increase FOTO to 54 to improve functional mobility.  Baseline: initial FOTO 46.  11/2: 50 Goal status: Partially met  2.  Pt. Able to tolerate standing 10 minutes while pulling up pants with mod. I safely while wearing prosthetic leg to improve daily activities.  Baseline:  limited standing tolerance Goal status: Not met  3.  Pt. Will ambulate 200 feet with use of SPC and mod. I to promote safety with household tasks/ walking into bathroom.   Baseline:  amb. With RW and min. A for safety.  2/27: walking in clinic with SBA/mod. I for 100 feet with RW and use of prosthetic leg  Goal status: Not met  4.  Pt. Will ascend 10 stairs with use of single forearm crutch/ handrail and step to pattern with mod. I safely.  Baseline:  see above Goal status: Initial    PLAN: PT FREQUENCY: 2x/week  PT DURATION: 8 weeks  PLANNED INTERVENTIONS: Therapeutic exercises, Therapeutic activity, Neuromuscular re-education, Balance training, Gait training, Patient/Family education, Self Care, Joint mobilization, Prosthetic training, DME  instructions, and Manual therapy  PLAN FOR NEXT SESSION:  Progress gait/ standing tolerance.   Pura Spice, PT, DPT # [phone] 6:45 PM,03/31/22

## 2022-04-05 ENCOUNTER — Ambulatory Visit: Payer: Medicare Other | Attending: Internal Medicine | Admitting: Physical Therapy

## 2022-04-05 DIAGNOSIS — R293 Abnormal posture: Secondary | ICD-10-CM | POA: Insufficient documentation

## 2022-04-05 DIAGNOSIS — R269 Unspecified abnormalities of gait and mobility: Secondary | ICD-10-CM | POA: Diagnosis present

## 2022-04-05 DIAGNOSIS — Z89611 Acquired absence of right leg above knee: Secondary | ICD-10-CM | POA: Insufficient documentation

## 2022-04-05 DIAGNOSIS — M6281 Muscle weakness (generalized): Secondary | ICD-10-CM | POA: Insufficient documentation

## 2022-04-05 NOTE — Therapy (Signed)
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   Hypertension    PONV (postoperative nausea and vomiting)    Seasonal allergies    Sleep apnea    does not uses CPAP machine anymore    Wheelchair dependent    able to self transfer   Past Surgical History:  Procedure Laterality Date   APPENDECTOMY     CATARACT EXTRACTION W/PHACO Right 02/28/2022   Procedure: CATARACT EXTRACTION PHACO AND INTRAOCULAR LENS PLACEMENT (I[facility]) RIGHT  4.82  00:30.9;  Surgeon: Eulogio Bear, MD;  Location: [place];  Service: Ophthalmology;  Laterality: Right;  sleep apnea   CATARACT EXTRACTION W/PHACO Left 03/14/2022   Procedure: CATARACT EXTRACTION PHACO AND INTRAOCULAR LENS PLACEMENT ([place]) LEFT;  Surgeon: Eulogio Bear, MD;  Location: [place];  Service: Ophthalmology;  Laterality: Left;  4.32 0:39.7   CESAREAN SECTION     CHOLECYSTECTOMY     DILATION AND CURETTAGE OF UTERUS     ESOPHAGOGASTRODUODENOSCOPY N/A 02/22/2019   Procedure: ESOPHAGOGASTRODUODENOSCOPY (EGD);  Surgeon: Toledo, Benay Pike, MD;  Location: [facility];  Service: Gastroenterology;  Laterality: N/A;   FLEXIBLE SIGMOIDOSCOPY N/A 02/22/2019   Procedure: FLEXIBLE SIGMOIDOSCOPY;  Surgeon: Toledo, Benay Pike, MD;  Location: [facility];  Service: Gastroenterology;  Laterality: N/A;   HARDWARE REMOVAL Right 11/12/2019   Procedure: Right ankle hardware  removal;  Surgeon: Hessie Knows, MD;  Location: [facility];  Service: Orthopedics;  Laterality: Right;   HARDWARE REMOVAL Right 04/17/2020   Procedure: HARDWARE REMOVAL FROM RIGHT ANKLE AND RIGHT KNEE; IMPLANT OF CEMENT SPACERS IN RIGHT KNEE;  Surgeon: Dereck Leep, MD;  Location: [facility];  Service: Orthopedics;  Laterality: Right;   JOINT REPLACEMENT     LEG AMPUTATION ABOVE KNEE Right 12/02/2020   [facility]   ORIF ANKLE FRACTURE Right 03/14/2019   Procedure: OPEN REDUCTION INTERNAL FIXATION (ORIF) ANKLE FRACTURE, MEDIAL MALLEOLUS;  Surgeon: Hessie Knows, MD;  Location: [facility];  Service: Orthopedics;  Laterality: Right;   ORIF ANKLE FRACTURE Right 02/08/2019   Procedure: OPEN REDUCTION INTERNAL FIXATION (ORIF) ANKLE FRACTURE, post malleolus;  Surgeon: Hessie Knows, MD;  Location: [facility];  Service: Orthopedics;  Laterality: Right;   PARTIAL HYSTERECTOMY  1993   REPLACEMENT TOTAL KNEE Bilateral 2008   2009   SCAR DEBRIDEMENT OF TOTAL KNEE  01/2020   SYNDESMOSIS REPAIR Right 02/08/2019   Procedure: SYNDESMOSIS REPAIR;  Surgeon: Hessie Knows, MD;  Location: [facility];  Service: Orthopedics;  Laterality: Right;   TEE WITHOUT CARDIOVERSION N/A 02/26/2019   Procedure: TRANSESOPHAGEAL ECHOCARDIOGRAM (TEE);  Surgeon: Corey Skains, MD;  Location: [facility];  Service: Cardiovascular;  Laterality: N/A;   TOTAL KNEE REVISION WITH SCAR DEBRIDEMENT/PATELLA REVISION WITH POLY EXCHANGE Right 01/24/2020   Procedure: TOTAL KNEE REVISION  WITH SCAR DEBRIDEMENT/PATELLA REVISION WITH POLY EXCHANGE;  Surgeon: Dereck Leep, MD;  Location: [facility];  Service: Orthopedics;  Laterality: Right;   TUMOR REMOVAL     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   Patient Active Problem List   Diagnosis Date Noted   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[place]) 04/17/2020   Anxiety 04/17/2020   History of GI diverticular bleed 04/17/2020   Chest pain 04/17/2020   PAD (peripheral artery disease) ([place]) 04/05/2020   Ulcer  of right leg ([place]) 04/05/2020   COPD (chronic obstructive pulmonary disease) ([place]) 04/05/2020   GERD (gastroesophageal reflux disease) 04/05/2020   Abscess of right knee 01/22/2020   Pressure injury of skin 02/28/2019   SOB (shortness of breath)    Gastrointestinal hemorrhage    Sepsis ([place])    Symptomatic anemia 02/20/2019   Trimalleolar fracture of ankle, closed, right, initial encounter 02/07/2019   Multiple lung nodules 03/31/2014   Extrinsic asthma 03/27/2014   OSA on CPAP 03/27/2014    PCP: Rusty Aus, MD  REFERRING PROVIDER: Rusty Aus, MD  REFERRING DIAG: Above knee amputation of right lower extremity  THERAPY DIAG:  S/P AKA (above knee amputation) unilateral, right (HCC)  Muscle weakness (generalized)  Gait difficulty  Abnormal posture  Rationale for Evaluation and Treatment Rehabilitation  ONSET DATE: 12/02/20  SUBJECTIVE:  EVALUATION  PERTINENT HISTORY: Patient Profile:   Jasmine Morrow is a 77 y.o. female Chief Complaint  Patient presents with  Visit Follow Up  Doing well.   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 Cardiac cath 10/2011 showed completely normal coronary arteries, however she did have mild left ventricular dysfunction with anterior wall motion abnormality. Left ventricular EF was 51%.  History of chickenpox  Hyperlipidemia  patient states she  has never been diagnosed with this  Hypertension  Knee joint replacement by other means  Macular degeneration  Major depressive disorder, recurrent, mild (CMS-HCC)  Medicare annual wellness visit, initial 08/09/2019  7/21  Multiple thyroid nodules  Normal coronary arteries 09/06/2013  Normal coronary anatomy by cardiac catheterization 10/12/11  Osteoarthritis  Ovarian cyst  PONV (postoperative nausea and vomiting)  nausea  Postoperative urinary retention 12/08/2020  Rash on lips 12/21/2020  Seasonal allergies  Sinusitis, unspecified  Sleep apnea  On CPAP.  Slow transit constipation 12/10/2020  Trimalleolar fracture of ankle, closed, right, initial encounter [id number]  Complicated by staph infection 1/21, 4 weeks IV Ancef, Menz  Ulcer   Past Surgical History:  Procedure Laterality Date  SALPINGO OOPHORECTOMY Bilateral 1993  Right total knee arthroplasty 10/11/2006  Dr Marry Guan  Left total knee arthroplasty 10/12/2007  Dr Marry Guan  COLONOSCOPY 07/30/2012  internal hemorrhoids, diverticulosis  ANKLE ARTHODESIS W/ ARTHROSCOPY Right 02/08/2019  03/14/2019 second surgery  COLONOSCOPY 02/22/2019  Blood in entire colon/Diverticulosis - Presumed diverticular bleed. No repeat recommended per TKT.  EGD 02/22/2019  Gastritis/gastric ulcers/Hiatal hernia/Otherwise normal - no repeat recommended per TKT.  ORIF ANKLE FRACTURE Right 03/14/2019  Dr.  Menz  REMOVAL HARDWARE ANKLE FOOT/TOES Right 11/12/2019  Dr. Rudene Christians  Right knee arthrotomy, irrigation and debridement of the right knee, polyethylene exchange 01/24/2020  Dr Marry Guan  Removal of hardware (plates and screws) from the right ankle with irrigation, debridement, and placement of Stimulan antibiotic beads 04/17/2020  Dr Marry Guan  Right knee arthrotomy, extensive irrigation debridement, removal of right total knee implants, and placement of antibiotic impregnated polymethylmethacrylate cement spacer 04/17/2020  Dr Marry Guan  AMPUTATION LEG  ABOVE KNEE AKA Right 12/02/2020  Procedure: AMPUTATION, THIGH, THROUGH FEMUR, ANY LEVEL; Surgeon: Lyndle Herrlich, MD; Location: [place]; Service: Orthopedics; Laterality: Right;  TRANSFER ADJACENT TISSUE LEG Right 12/02/2020  Procedure: ADJACENT TISSUE TRANSFER OR REARRANGEMENT, LEG; DEFECT 10 SQ CM OR LESS; Surgeon: Lyndle Herrlich, MD; Location: [place]; Service: Orthopedics; Laterality: Right;  ASPIRATION/INJECTION MAJOR JOINT/BURSA KNEE Left 12/02/2020  Procedure: ARTHROCENTESIS, ASPIRATION AND/OR INJECTION, MAJOR JOINT OR BURSA, KNEE; WITHOUT ULTRASOUND GUIDANCE; Surgeon: Lyndle Herrlich, MD; Location: [place]; Service: Orthopedics; Laterality: Left;  REMOVAL KNEE PROSTHESIS W/POSSIBLE PLACEMENT SPACER Left 01/18/2021  Procedure: REMOVAL OF PROSTHESIS, INCLUDING TOTAL KNEE PROSTHESIS, METHYLMETHACRYLATE WITH OR WITHOUT INSERTION OF SPACER, KNEE; Surgeon: Ihor Dow, MD; Location: [place]; Service: Orthopedics; Laterality: Left;  INSERTION NON-BIODEGRADABLE DRUG DELIVERY IMPLANT Left 01/18/2021  Procedure: INSERTION, KNEE, bioresorbable, biodegradable, NON-BIODEGRADABLE DRUG DELIVERY IMPLANT; Surgeon: Ihor Dow, MD; Location: [place]; Service: Orthopedics; Laterality: Left;  ABOVE KNEE LEG AMPUTATION  12/02/2020  APPENDECTOMY Est 1968  CESAREAN SECTION  CHOLECYSTECTOMY  FRACTURE SURGERY 1/21  HYSTERECTOMY partial  JOINT REPLACEMENT 2008 & 2009  TUBAL LIGATION 3/79   ALLERGIES: Allergies  Allergen Reactions  Singulair [Montelukast] Other (See Comments)  Gland swelling  Sulfa (Sulfonamide Antibiotics) Swelling  Oxycodone Dizziness and Other (See Comments)  Vancomycin Analogues Other (See Comments)  Ringing in ears  Avinza [Morphine] Itching  Darvocet A500 [Propoxyphene N-Acetaminophen] Nausea  Nickel Rash  Ultracet [Tramadol-Acetaminophen] Rash  Vicodin [Hydrocodone-Acetaminophen] Vomiting  Vioxx [Rofecoxib]  Other (See Comments)  GI   CURRENT MEDICATIONS: Current Outpatient Medications: acetaminophen (TYLENOL) 500 MG tablet, Take 1,000 mg by mouth 3 (three) times a day Twice daily per patient., PRN Not Currently Taking aspirin 81 MG EC tablet, Take 1 tablet (81 mg total) by mouth at bedtime, Taking calcium carbonate-vitamin D3 (CALTRATE 600+D) 600 mg-10 mcg (400 unit) tablet, Take 0.5 tablets by mouth 2 (two) times daily, Taking cetirizine (ZYRTEC) 10 mg capsule, Take 1 capsule (10 mg total) by mouth once daily, Taking escitalopram oxalate (LEXAPRO) 10 MG tablet, Take 1 tablet (10 mg total) by mouth once daily, Taking estradioL (ESTRACE) 0.01 % (0.1 mg/gram) vaginal cream, Place 2 g vaginally twice a week, Taking folic acid/multivit,iron,miner (MULTIVIT-IRON-MIN-FOLIC ACID ORAL), Take 1 tablet by mouth once daily, Taking gabapentin (NEURONTIN) 300 MG capsule, Take 1 capsule (300 mg total) by mouth 3 (three) times daily Phantom limb pain, Taking melatonin 3 mg tablet, Take 1 tablet (3 mg total) by mouth at bedtime, Taking multivitamin with iron (COMPLETE MULTIVITAMIN-MINERAL) tablet, Take 1 tablet by mouth, Taking multivitamin with minerals, EYE, (PRESERVISION AREDS 2) soft gel capsule, Take 1 capsule by mouth 2 (two) times daily, Taking nystatin (MYCOSTATIN) 100,000 unit/gram powder, Apply small amount topically twice daily for rash under breasts, PRN Not Currently Taking pantoprazole (PROTONIX) 40 MG DR tablet, Take 1 tablet (40 mg total) by mouth 2 (two) times daily. Please call to schedule an appt. Thanks!, Taking polyethylene glycol (MIRALAX) powder, Take 17 g by mouth once daily Mix  in 4-8ounces of fluid prior to taking., Taking sennosides-docusate (SENOKOT-S) 8.6-50 mg tablet, Take 1 tablet by mouth 2 (two) times daily For constipation, Taking lactose-reduced food (ENSURE PLUS ORAL), Take 237 mLs by mouth 2 (two) times daily With breakfast and dinner  HPI   CLINICAL SUMMARY:  Patient post  right AKA and working with prosthesis. 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 Able to do housework, improving  PAIN:  Are you having pain? Yes: NPRS scale: 5/10 Pain location: L knee Pain description: aching Aggravating factors: walking Relieving factors: rest  PRECAUTIONS: None  WEIGHT BEARING RESTRICTIONS No  FALLS:  Has patient fallen in last 6 months? No  LIVING ENVIRONMENT: Lives with: lives with their spouse Lives in: House/apartment Stairs: No Has following equipment at home: Gilford Rile - 4 wheeled and Wheelchair (manual)  OCCUPATION: retired  PLOF: Requires assistive device for independence  PATIENT GOALS  improve standing tolerance/ pull up pants/ ambulate with [place] safely.     OBJECTIVE:   PATIENT SURVEYS:  FOTO initial 46/ goal 80.   11/2: 50  COGNITION:  Overall cognitive status: Within functional limits for tasks assessed     SENSATION: WFL  POSTURE: rounded shoulders, forward head, flexed trunk , and weight shift left  PALPATION: No tenderness along R distal residual limb.  Discussed phantom limb sensation.    LOWER EXTREMITY ROM:  B LE AROM WFL except L knee extension secondary to articulating spacer.  Did not assess L/R hip extension at this time.    LOWER EXTREMITY MMT:  MMT Right eval Left eval  Hip flexion 4/5 4/5  Hip extension    Hip abduction 4+/5 4+/5  Hip adduction 4+/5 4+/5  Hip internal rotation    Hip external rotation    Knee flexion N/A 4+/5  Knee extension N/A 4+/5  Ankle dorsiflexion  5/5  Ankle plantarflexion    Ankle inversion    Ankle eversion     (Blank rows = not tested)  GAIT: Distance walked: in gym/ //-bars Assistive device utilized: Environmental consultant - 2 wheeled Level of assistance: CGA Comments: Cuing for posture correction with mirror feedback.  Flexed posture/ heavy UE assist.     TODAY'S TREATMENT:  04/05/22  Subjective:  Pt. Reports no new complaints.  Pt. Arrived in w/c and ready to  work on gait/ standing balance.   Pt. Did not walk at church but states the carpet is not high and pt. Is hoping to try to walk at church this week.  Pt. Requesting trying use of [place] crutch during tx./ gait training today.   Objective:   There.ex.:  No charge   Nustep L5 10 min. L UE/B LE warm-up prior to gait.  Improvement in prosthetic fit after Nustep.     There.act.:     Car transfer training while donning prosthetic leg/ managing swinging R prosthetic leg into/out of car.  Discussed use of grab handle for car hinge to assist with descending into car.              Gait training:    Amb. In gym with use of RW and focus on step through gait. and heel strike during and after the gait.  Cuing to increase R LE step length while maintaining consistent BOS/ heel strike.  Forward/backward walking in //-bars 4 laps each.   Amb. In //-bars with use of single forearm crutch on L with R //-bar and 2-point gait.  Pt. Attempted use of B forearm  crutches in //-bars but challenged by fatigue/ balance.  Pt. Highly motivated and wants to be more independent with gait, esp. While using RW.   Pt. Ambulates 40 feet in gym with use of RW and mod. I while husband pushing w/c behind pt.  PT did not assist and pt. Focused on becoming more independent without SBA/CGA.  Excellent R knee mechanism control and no LOB noted.   Walking from //-bars to car in front of clinic with consistent recip step pattern/ good R knee mechanism control.    No stairs today.    PATIENT EDUCATION:  Education details: HEP/ gait and prosthetic training.  Person educated: Patient and Spouse Education method: Explanation, Demonstration, and Verbal cues Education comprehension: verbalized understanding, returned demonstration, and tactile cues required   HOME EXERCISE PROGRAM: Prone position hip stretches/ Standing marching at kitchen counter with w/c behind patient.      ASSESSMENT:  CLINICAL IMPRESSION: Pt. Presents to  PT in w/c and brought prosthetic leg.  Pt. Highly motivated to become more independent with use of RW while walking short distances in gym.  Pt. Demonstrates ability to walk without PT assist and demonstrate to pt. Husband while he follows pt. With w/c.  Pt. Able to transfer in/out of passenger side of car with mod. I and manage prosthesis. Pt. Will benefit from skilled PT services to improve standing tolerance/ gait independence and ADL.    OBJECTIVE IMPAIRMENTS Abnormal gait, decreased activity tolerance, decreased balance, decreased endurance, decreased mobility, difficulty walking, decreased ROM, decreased strength, decreased safety awareness, impaired flexibility, improper body mechanics, prosthetic dependency , and pain.   ACTIVITY LIMITATIONS carrying, lifting, standing, squatting, stairs, transfers, bed mobility, bathing, toileting, dressing, hygiene/grooming, and locomotion level  PARTICIPATION LIMITATIONS: cleaning, laundry, driving, shopping, community activity, and yard work  [place] Past/current experiences are also affecting patient's functional outcome.   REHAB POTENTIAL: Good  CLINICAL DECISION MAKING: Evolving/moderate complexity  EVALUATION COMPLEXITY: High   GOALS: Goals reviewed with patient Yes  LONG TERM GOALS: Target date: 05/24/22  Pt. Will increase FOTO to 54 to improve functional mobility.  Baseline: initial FOTO 46.  11/2: 50 Goal status: Partially met  2.  Pt. Able to tolerate standing 10 minutes while pulling up pants with mod. I safely while wearing prosthetic leg to improve daily activities.  Baseline:  limited standing tolerance Goal status: Not met  3.  Pt. Will ambulate 200 feet with use of SPC and mod. I to promote safety with household tasks/ walking into bathroom.   Baseline:  amb. With RW and min. A for safety.  2/27: walking in clinic with SBA/mod. I for 100 feet with RW and use of prosthetic leg  Goal status: Not met  4.  Pt. Will  ascend 10 stairs with use of single forearm crutch/ handrail and step to pattern with mod. I safely.  Baseline:  see above Goal status: Initial    PLAN: PT FREQUENCY: 2x/week  PT DURATION: 8 weeks  PLANNED INTERVENTIONS: Therapeutic exercises, Therapeutic activity, Neuromuscular re-education, Balance training, Gait training, Patient/Family education, Self Care, Joint mobilization, Prosthetic training, DME instructions, and Manual therapy  PLAN FOR NEXT SESSION:  Progress gait/ standing tolerance.   Pura Spice, PT, DPT # [phone] 3:43 PM,04/05/22

## 2022-04-07 ENCOUNTER — Encounter: Payer: Self-pay | Admitting: Physical Therapy

## 2022-04-07 ENCOUNTER — Ambulatory Visit: Payer: Medicare Other | Admitting: Physical Therapy

## 2022-04-07 DIAGNOSIS — R269 Unspecified abnormalities of gait and mobility: Secondary | ICD-10-CM

## 2022-04-07 DIAGNOSIS — M6281 Muscle weakness (generalized): Secondary | ICD-10-CM

## 2022-04-07 DIAGNOSIS — Z89611 Acquired absence of right leg above knee: Secondary | ICD-10-CM | POA: Diagnosis not present

## 2022-04-07 DIAGNOSIS — R293 Abnormal posture: Secondary | ICD-10-CM

## 2022-04-07 NOTE — Therapy (Signed)
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   Hypertension    PONV (postoperative nausea and vomiting)    Seasonal allergies    Sleep apnea    does not uses CPAP machine anymore    Wheelchair dependent    able to self transfer   Past Surgical History:  Procedure Laterality Date   APPENDECTOMY     CATARACT EXTRACTION W/PHACO Right 02/28/2022   Procedure: CATARACT EXTRACTION PHACO AND INTRAOCULAR LENS PLACEMENT (IOC) RIGHT  4.82  00:30.9;  Surgeon: Eulogio Bear, MD;  Location: [place];  Service: Ophthalmology;  Laterality: Right;  sleep apnea   CATARACT EXTRACTION W/PHACO Left 03/14/2022   Procedure: CATARACT EXTRACTION PHACO AND INTRAOCULAR LENS PLACEMENT ([place]) LEFT;  Surgeon: Eulogio Bear, MD;  Location: [place];  Service: Ophthalmology;  Laterality: Left;  4.32 0:39.7   CESAREAN SECTION     CHOLECYSTECTOMY     DILATION AND CURETTAGE OF UTERUS     ESOPHAGOGASTRODUODENOSCOPY N/A 02/22/2019   Procedure: ESOPHAGOGASTRODUODENOSCOPY (EGD);  Surgeon: Toledo, Benay Pike, MD;  Location: [facility];  Service: Gastroenterology;  Laterality: N/A;   FLEXIBLE SIGMOIDOSCOPY N/A 02/22/2019   Procedure: FLEXIBLE SIGMOIDOSCOPY;  Surgeon: Toledo, Benay Pike, MD;  Location: [facility];  Service: Gastroenterology;  Laterality: N/A;   HARDWARE REMOVAL Right 11/12/2019   Procedure: Right ankle hardware removal;   Surgeon: Hessie Knows, MD;  Location: [facility];  Service: Orthopedics;  Laterality: Right;   HARDWARE REMOVAL Right 04/17/2020   Procedure: HARDWARE REMOVAL FROM RIGHT ANKLE AND RIGHT KNEE; IMPLANT OF CEMENT SPACERS IN RIGHT KNEE;  Surgeon: Dereck Leep, MD;  Location: [facility];  Service: Orthopedics;  Laterality: Right;   JOINT REPLACEMENT     LEG AMPUTATION ABOVE KNEE Right 12/02/2020   [facility]   ORIF ANKLE FRACTURE Right 03/14/2019   Procedure: OPEN REDUCTION INTERNAL FIXATION (ORIF) ANKLE FRACTURE, MEDIAL MALLEOLUS;  Surgeon: Hessie Knows, MD;  Location: [facility];  Service: Orthopedics;  Laterality: Right;   ORIF ANKLE FRACTURE Right 02/08/2019   Procedure: OPEN REDUCTION INTERNAL FIXATION (ORIF) ANKLE FRACTURE, post malleolus;  Surgeon: Hessie Knows, MD;  Location: [facility];  Service: Orthopedics;  Laterality: Right;   PARTIAL HYSTERECTOMY  1993   REPLACEMENT TOTAL KNEE Bilateral 2008   2009   SCAR DEBRIDEMENT OF TOTAL KNEE  01/2020   SYNDESMOSIS REPAIR Right 02/08/2019   Procedure: SYNDESMOSIS REPAIR;  Surgeon: Hessie Knows, MD;  Location: [facility];  Service: Orthopedics;  Laterality: Right;   TEE WITHOUT CARDIOVERSION N/A 02/26/2019   Procedure: TRANSESOPHAGEAL ECHOCARDIOGRAM (TEE);  Surgeon: Corey Skains, MD;  Location: [facility];  Service: Cardiovascular;  Laterality: N/A;   TOTAL KNEE REVISION WITH SCAR DEBRIDEMENT/PATELLA REVISION WITH POLY EXCHANGE Right 01/24/2020   Procedure: TOTAL KNEE REVISION WITH  SCAR DEBRIDEMENT/PATELLA REVISION WITH POLY EXCHANGE;  Surgeon: Dereck Leep, MD;  Location: [facility];  Service: Orthopedics;  Laterality: Right;   TUMOR REMOVAL     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   Patient Active Problem List   Diagnosis Date Noted   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[place]) 04/17/2020   Anxiety 04/17/2020   History of GI diverticular bleed 04/17/2020   Chest pain 04/17/2020   PAD (peripheral artery disease) ([place]) 04/05/2020   Ulcer of right  leg ([place]) 04/05/2020   COPD (chronic obstructive pulmonary disease) ([place]) 04/05/2020   GERD (gastroesophageal reflux disease) 04/05/2020   Abscess of right knee 01/22/2020   Pressure injury of skin 02/28/2019   SOB (shortness of breath)    Gastrointestinal hemorrhage    Sepsis ([place])    Symptomatic anemia 02/20/2019   Trimalleolar fracture of ankle, closed, right, initial encounter 02/07/2019   Multiple lung nodules 03/31/2014   Extrinsic asthma 03/27/2014   OSA on CPAP 03/27/2014    PCP: Rusty Aus, MD  REFERRING PROVIDER: Rusty Aus, MD  REFERRING DIAG: Above knee amputation of right lower extremity  THERAPY DIAG:  S/P AKA (above knee amputation) unilateral, right (HCC)  Muscle weakness (generalized)  Gait difficulty  Abnormal posture  Rationale for Evaluation and Treatment Rehabilitation  ONSET DATE: 12/02/20  SUBJECTIVE:  EVALUATION  PERTINENT HISTORY: Patient Profile:   Jasmine Morrow is a 77 y.o. female Chief Complaint  Patient presents with  Visit Follow Up  Doing well.   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 Cardiac cath 10/2011 showed completely normal coronary arteries, however she did have mild left ventricular dysfunction with anterior wall motion abnormality. Left ventricular EF was 51%.  History of chickenpox  Hyperlipidemia  patient states she has  never been diagnosed with this  Hypertension  Knee joint replacement by other means  Macular degeneration  Major depressive disorder, recurrent, mild (CMS-HCC)  Medicare annual wellness visit, initial 08/09/2019  7/21  Multiple thyroid nodules  Normal coronary arteries 09/06/2013  Normal coronary anatomy by cardiac catheterization 10/12/11  Osteoarthritis  Ovarian cyst  PONV (postoperative nausea and vomiting)  nausea  Postoperative urinary retention 12/08/2020  Rash on lips 12/21/2020  Seasonal allergies  Sinusitis, unspecified  Sleep apnea  On CPAP.  Slow transit constipation 12/10/2020  Trimalleolar fracture of ankle, closed, right, initial encounter [id number]  Complicated by staph infection 1/21, 4 weeks IV Ancef, [place]  Ulcer   Past Surgical History:  Procedure Laterality Date  SALPINGO OOPHORECTOMY Bilateral 1993  Right total knee arthroplasty 10/11/2006  Dr Marry Guan  Left total knee arthroplasty 10/12/2007  Dr Marry Guan  COLONOSCOPY 07/30/2012  internal hemorrhoids, diverticulosis  ANKLE ARTHODESIS W/ ARTHROSCOPY Right 02/08/2019  03/14/2019 second surgery  COLONOSCOPY 02/22/2019  Blood in entire colon/Diverticulosis - Presumed diverticular bleed. No repeat recommended per TKT.  EGD 02/22/2019  Gastritis/gastric ulcers/Hiatal hernia/Otherwise normal - no repeat recommended per TKT.  ORIF ANKLE FRACTURE Right 03/14/2019  Dr. Rudene Christians  REMOVAL HARDWARE ANKLE FOOT/TOES Right 11/12/2019  Dr. Rudene Christians  Right knee arthrotomy, irrigation and debridement of the right knee, polyethylene exchange 01/24/2020  Dr Marry Guan  Removal of hardware (plates and screws) from the right ankle with irrigation, debridement, and placement of Stimulan antibiotic beads 04/17/2020  Dr Marry Guan  Right knee arthrotomy, extensive irrigation debridement, removal of right total knee implants, and placement of antibiotic impregnated polymethylmethacrylate cement spacer 04/17/2020  Dr Marry Guan  AMPUTATION LEG ABOVE  KNEE AKA Right 12/02/2020  Procedure: AMPUTATION, THIGH, THROUGH FEMUR, ANY LEVEL; Surgeon: Lyndle Herrlich, MD; Location: [place]; Service: Orthopedics; Laterality: Right;  TRANSFER ADJACENT TISSUE LEG Right 12/02/2020  Procedure: ADJACENT TISSUE TRANSFER OR REARRANGEMENT, LEG; DEFECT 10 SQ CM OR LESS; Surgeon: Lyndle Herrlich, MD; Location: [place]; Service: Orthopedics; Laterality: Right;  ASPIRATION/INJECTION MAJOR JOINT/BURSA KNEE Left 12/02/2020  Procedure: ARTHROCENTESIS, ASPIRATION AND/OR INJECTION, MAJOR JOINT OR BURSA, KNEE; WITHOUT ULTRASOUND GUIDANCE; Surgeon: Lyndle Herrlich, MD; Location: [place]; Service: Orthopedics; Laterality: Left;  REMOVAL KNEE PROSTHESIS W/POSSIBLE PLACEMENT SPACER Left 01/18/2021  Procedure: REMOVAL OF PROSTHESIS, INCLUDING TOTAL KNEE PROSTHESIS, METHYLMETHACRYLATE WITH OR WITHOUT INSERTION OF SPACER, KNEE; Surgeon: Ihor Dow, MD; Location: [place]; Service: Orthopedics; Laterality: Left;  INSERTION NON-BIODEGRADABLE DRUG DELIVERY IMPLANT Left 01/18/2021  Procedure: INSERTION, KNEE, bioresorbable, biodegradable, NON-BIODEGRADABLE DRUG DELIVERY IMPLANT; Surgeon: Ihor Dow, MD; Location: [place]; Service: Orthopedics; Laterality: Left;  ABOVE KNEE LEG AMPUTATION  12/02/2020  APPENDECTOMY Est 1968  CESAREAN SECTION  CHOLECYSTECTOMY  FRACTURE SURGERY 1/21  HYSTERECTOMY partial  JOINT REPLACEMENT 2008 & 2009  TUBAL LIGATION 3/79   ALLERGIES: Allergies  Allergen Reactions  Singulair [Montelukast] Other (See Comments)  Gland swelling  Sulfa (Sulfonamide Antibiotics) Swelling  Oxycodone Dizziness and Other (See Comments)  Vancomycin Analogues Other (See Comments)  Ringing in ears  Avinza [Morphine] Itching  Darvocet A500 [Propoxyphene N-Acetaminophen] Nausea  Nickel Rash  Ultracet [Tramadol-Acetaminophen] Rash  Vicodin [Hydrocodone-Acetaminophen] Vomiting  Vioxx [Rofecoxib] Other  (See Comments)  GI   CURRENT MEDICATIONS: Current Outpatient Medications: acetaminophen (TYLENOL) 500 MG tablet, Take 1,000 mg by mouth 3 (three) times a day Twice daily per patient., PRN Not Currently Taking aspirin 81 MG EC tablet, Take 1 tablet (81 mg total) by mouth at bedtime, Taking calcium carbonate-vitamin D3 (CALTRATE 600+D) 600 mg-10 mcg (400 unit) tablet, Take 0.5 tablets by mouth 2 (two) times daily, Taking cetirizine (ZYRTEC) 10 mg capsule, Take 1 capsule (10 mg total) by mouth once daily, Taking escitalopram oxalate (LEXAPRO) 10 MG tablet, Take 1 tablet (10 mg total) by mouth once daily, Taking estradioL (ESTRACE) 0.01 % (0.1 mg/gram) vaginal cream, Place 2 g vaginally twice a week, Taking folic acid/multivit,iron,miner (MULTIVIT-IRON-MIN-FOLIC ACID ORAL), Take 1 tablet by mouth once daily, Taking gabapentin (NEURONTIN) 300 MG capsule, Take 1 capsule (300 mg total) by mouth 3 (three) times daily Phantom limb pain, Taking melatonin 3 mg tablet, Take 1 tablet (3 mg total) by mouth at bedtime, Taking multivitamin with iron (COMPLETE MULTIVITAMIN-MINERAL) tablet, Take 1 tablet by mouth, Taking multivitamin with minerals, EYE, (PRESERVISION AREDS 2) soft gel capsule, Take 1 capsule by mouth 2 (two) times daily, Taking nystatin (MYCOSTATIN) 100,000 unit/gram powder, Apply small amount topically twice daily for rash under breasts, PRN Not Currently Taking pantoprazole (PROTONIX) 40 MG DR tablet, Take 1 tablet (40 mg total) by mouth 2 (two) times daily. Please call to schedule an appt. Thanks!, Taking polyethylene glycol (MIRALAX) powder, Take 17 g by mouth once daily Mix in 4-8ounces  of fluid prior to taking., Taking sennosides-docusate (SENOKOT-S) 8.6-50 mg tablet, Take 1 tablet by mouth 2 (two) times daily For constipation, Taking lactose-reduced food (ENSURE PLUS ORAL), Take 237 mLs by mouth 2 (two) times daily With breakfast and dinner  HPI   CLINICAL SUMMARY:  Patient post right  AKA and working with prosthesis. 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 Able to do housework, improving  PAIN:  Are you having pain? Yes: NPRS scale: 5/10 Pain location: L knee Pain description: aching Aggravating factors: walking Relieving factors: rest  PRECAUTIONS: None  WEIGHT BEARING RESTRICTIONS No  FALLS:  Has patient fallen in last 6 months? No  LIVING ENVIRONMENT: Lives with: lives with their spouse Lives in: House/apartment Stairs: No Has following equipment at home: Gilford Rile - 4 wheeled and Wheelchair (manual)  OCCUPATION: retired  PLOF: Requires assistive device for independence  PATIENT GOALS  improve standing tolerance/ pull up pants/ ambulate with [place] safely.     OBJECTIVE:   PATIENT SURVEYS:  FOTO initial 46/ goal 50.   11/2: 50  COGNITION:  Overall cognitive status: Within functional limits for tasks assessed     SENSATION: WFL  POSTURE: rounded shoulders, forward head, flexed trunk , and weight shift left  PALPATION: No tenderness along R distal residual limb.  Discussed phantom limb sensation.    LOWER EXTREMITY ROM:  B LE AROM WFL except L knee extension secondary to articulating spacer.  Did not assess L/R hip extension at this time.    LOWER EXTREMITY MMT:  MMT Right eval Left eval  Hip flexion 4/5 4/5  Hip extension    Hip abduction 4+/5 4+/5  Hip adduction 4+/5 4+/5  Hip internal rotation    Hip external rotation    Knee flexion N/A 4+/5  Knee extension N/A 4+/5  Ankle dorsiflexion  5/5  Ankle plantarflexion    Ankle inversion    Ankle eversion     (Blank rows = not tested)  GAIT: Distance walked: in gym/ //-bars Assistive device utilized: Environmental consultant - 2 wheeled Level of assistance: CGA Comments: Cuing for posture correction with mirror feedback.  Flexed posture/ heavy UE assist.     TODAY'S TREATMENT:  04/07/22  Subjective:  Pt. Reports no new complaints.  Pt. Arrived in w/c and ready to work  on gait/ standing balance.   Pt. Has a dinner party tonight to honor son's birthday.    Objective:   There.ex.:  No charge   Nustep L5 10 min. L UE/B LE warm-up prior to gait.  Improvement in prosthetic fit after Nustep.                Gait training:    Amb. From lobby to gym with use of RW and focus on step through gait. and heel strike during and after the gait.  Cuing to increase R LE step length while maintaining consistent BOS/ heel strike.  Amb. From Nustep to //-bars.    Forward/backward walking in //-bars 4 laps each.  Improved upright posture/ head position with mirror feedback.     Amb. In //-bars with use of single forearm crutch on L with R //-bar and 2-point gait.   Pt. Completes 3 laps with 1 episode of L foot clipping forearm crutch causing slight LOB but able to self-correct.  Pt. Highly motivated and wants to be more independent with gait, esp. While using RW.   Walking from //-bars to stairs to car in front of clinic with  consistent recip step pattern/ good R knee mechanism control.  1 standing rest break in hallway while walking to car.    Ascend/descend 4 stairs with step to pattern with B UE assist x1.  Marked B shoulder/ UE muscle fatigue.     PATIENT EDUCATION:  Education details: HEP/ gait and prosthetic training.  Person educated: Patient and Spouse Education method: Explanation, Demonstration, and Verbal cues Education comprehension: verbalized understanding, returned demonstration, and tactile cues required   HOME EXERCISE PROGRAM: Prone position hip stretches/ Standing marching at kitchen counter with w/c behind patient.      ASSESSMENT:  CLINICAL IMPRESSION: Pt. Presents to PT in w/c and brought prosthetic leg.  Pt. Highly motivated to become more independent with use of RW while walking short distances in gym.  Pt. Demonstrates ability to walk without PT assist and demonstrate to pt. Husband while he follows pt. With w/c.  Pt. Able to transfer in/out of  passenger side of car with mod. I and manage prosthesis. Pt. Will benefit from skilled PT services to improve standing tolerance/ gait independence and ADL.    OBJECTIVE IMPAIRMENTS Abnormal gait, decreased activity tolerance, decreased balance, decreased endurance, decreased mobility, difficulty walking, decreased ROM, decreased strength, decreased safety awareness, impaired flexibility, improper body mechanics, prosthetic dependency , and pain.   ACTIVITY LIMITATIONS carrying, lifting, standing, squatting, stairs, transfers, bed mobility, bathing, toileting, dressing, hygiene/grooming, and locomotion level  PARTICIPATION LIMITATIONS: cleaning, laundry, driving, shopping, community activity, and yard work  [place] Past/current experiences are also affecting patient's functional outcome.   REHAB POTENTIAL: Good  CLINICAL DECISION MAKING: Evolving/moderate complexity  EVALUATION COMPLEXITY: High   GOALS: Goals reviewed with patient Yes  LONG TERM GOALS: Target date: 05/24/22  Pt. Will increase FOTO to 54 to improve functional mobility.  Baseline: initial FOTO 46.  11/2: 50 Goal status: Partially met  2.  Pt. Able to tolerate standing 10 minutes while pulling up pants with mod. I safely while wearing prosthetic leg to improve daily activities.  Baseline:  limited standing tolerance Goal status: Not met  3.  Pt. Will ambulate 200 feet with use of SPC and mod. I to promote safety with household tasks/ walking into bathroom.   Baseline:  amb. With RW and min. A for safety.  2/27: walking in clinic with SBA/mod. I for 100 feet with RW and use of prosthetic leg  Goal status: Not met  4.  Pt. Will ascend 10 stairs with use of single forearm crutch/ handrail and step to pattern with mod. I safely.  Baseline:  see above Goal status: Initial    PLAN: PT FREQUENCY: 2x/week  PT DURATION: 8 weeks  PLANNED INTERVENTIONS: Therapeutic exercises, Therapeutic activity, Neuromuscular  re-education, Balance training, Gait training, Patient/Family education, Self Care, Joint mobilization, Prosthetic training, DME instructions, and Manual therapy  PLAN FOR NEXT SESSION:  Progress gait/ standing tolerance.   Pura Spice, PT, DPT # [phone] 3:21 PM,04/07/22

## 2022-04-12 ENCOUNTER — Ambulatory Visit: Payer: Medicare Other | Admitting: Physical Therapy

## 2022-04-12 ENCOUNTER — Encounter: Payer: Self-pay | Admitting: Physical Therapy

## 2022-04-12 DIAGNOSIS — Z89611 Acquired absence of right leg above knee: Secondary | ICD-10-CM | POA: Diagnosis not present

## 2022-04-12 DIAGNOSIS — M6281 Muscle weakness (generalized): Secondary | ICD-10-CM

## 2022-04-12 DIAGNOSIS — R293 Abnormal posture: Secondary | ICD-10-CM

## 2022-04-12 DIAGNOSIS — R269 Unspecified abnormalities of gait and mobility: Secondary | ICD-10-CM

## 2022-04-12 NOTE — Therapy (Signed)
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   Hypertension    PONV (postoperative nausea and vomiting)    Seasonal allergies    Sleep apnea    does not uses CPAP machine anymore    Wheelchair dependent    able to self transfer   Past Surgical History:  Procedure Laterality Date   APPENDECTOMY     CATARACT EXTRACTION W/PHACO Right 02/28/2022   Procedure: CATARACT EXTRACTION PHACO AND INTRAOCULAR LENS PLACEMENT (IOC) RIGHT  4.82  00:30.9;  Surgeon: Eulogio Bear, MD;  Location: [place];  Service: Ophthalmology;  Laterality: Right;  sleep apnea   CATARACT EXTRACTION W/PHACO Left 03/14/2022   Procedure: CATARACT EXTRACTION PHACO AND INTRAOCULAR LENS PLACEMENT ([place]) LEFT;  Surgeon: Eulogio Bear, MD;  Location: [place];  Service: Ophthalmology;  Laterality: Left;  4.32 0:39.7   CESAREAN SECTION     CHOLECYSTECTOMY     DILATION AND CURETTAGE OF UTERUS     ESOPHAGOGASTRODUODENOSCOPY N/A 02/22/2019   Procedure: ESOPHAGOGASTRODUODENOSCOPY (EGD);  Surgeon: Toledo, Benay Pike, MD;  Location: [facility];  Service: Gastroenterology;  Laterality: N/A;   FLEXIBLE SIGMOIDOSCOPY N/A 02/22/2019   Procedure: FLEXIBLE SIGMOIDOSCOPY;  Surgeon: Toledo, Benay Pike, MD;  Location: [facility];  Service: Gastroenterology;  Laterality: N/A;   HARDWARE REMOVAL Right 11/12/2019   Procedure: Right ankle hardware removal;  Surgeon: Hessie Knows, MD;   Location: [facility];  Service: Orthopedics;  Laterality: Right;   HARDWARE REMOVAL Right 04/17/2020   Procedure: HARDWARE REMOVAL FROM RIGHT ANKLE AND RIGHT KNEE; IMPLANT OF CEMENT SPACERS IN RIGHT KNEE;  Surgeon: Dereck Leep, MD;  Location: [facility];  Service: Orthopedics;  Laterality: Right;   JOINT REPLACEMENT     LEG AMPUTATION ABOVE KNEE Right 12/02/2020   [facility]   ORIF ANKLE FRACTURE Right 03/14/2019   Procedure: OPEN REDUCTION INTERNAL FIXATION (ORIF) ANKLE FRACTURE, MEDIAL MALLEOLUS;  Surgeon: Hessie Knows, MD;  Location: [facility];  Service: Orthopedics;  Laterality: Right;   ORIF ANKLE FRACTURE Right 02/08/2019   Procedure: OPEN REDUCTION INTERNAL FIXATION (ORIF) ANKLE FRACTURE, post malleolus;  Surgeon: Hessie Knows, MD;  Location: [facility];  Service: Orthopedics;  Laterality: Right;   PARTIAL HYSTERECTOMY  1993   REPLACEMENT TOTAL KNEE Bilateral 2008   2009   SCAR DEBRIDEMENT OF TOTAL KNEE  01/2020   SYNDESMOSIS REPAIR Right 02/08/2019   Procedure: SYNDESMOSIS REPAIR;  Surgeon: Hessie Knows, MD;  Location: [facility];  Service: Orthopedics;  Laterality: Right;   TEE WITHOUT CARDIOVERSION N/A 02/26/2019   Procedure: TRANSESOPHAGEAL ECHOCARDIOGRAM (TEE);  Surgeon: Corey Skains, MD;  Location: [facility];  Service: Cardiovascular;  Laterality: N/A;   TOTAL KNEE REVISION WITH SCAR DEBRIDEMENT/PATELLA REVISION WITH POLY EXCHANGE Right 01/24/2020   Procedure: TOTAL KNEE REVISION WITH SCAR DEBRIDEMENT/PATELLA REVISION WITH POLY EXCHANGE;  Surgeon: Skip Estimable  P, MD;  Location: [facility];  Service: Orthopedics;  Laterality: Right;   TUMOR REMOVAL     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   Patient Active Problem List   Diagnosis Date Noted   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[place]) 04/17/2020   Anxiety 04/17/2020   History of GI diverticular bleed 04/17/2020   Chest pain 04/17/2020   PAD (peripheral artery disease) ([place]) 04/05/2020   Ulcer of right leg ([place]) 04/05/2020   COPD  (chronic obstructive pulmonary disease) ([place]) 04/05/2020   GERD (gastroesophageal reflux disease) 04/05/2020   Abscess of right knee 01/22/2020   Pressure injury of skin 02/28/2019   SOB (shortness of breath)    Gastrointestinal hemorrhage    Sepsis ([place])    Symptomatic anemia 02/20/2019   Trimalleolar fracture of ankle, closed, right, initial encounter 02/07/2019   Multiple lung nodules 03/31/2014   Extrinsic asthma 03/27/2014   OSA on CPAP 03/27/2014    PCP: Rusty Aus, MD  REFERRING PROVIDER: Rusty Aus, MD  REFERRING DIAG: Above knee amputation of right lower extremity  THERAPY DIAG:  S/P AKA (above knee amputation) unilateral, right (HCC)  Muscle weakness (generalized)  Gait difficulty  Abnormal posture  Rationale for Evaluation and Treatment Rehabilitation  ONSET DATE: 12/02/20  SUBJECTIVE:  EVALUATION  PERTINENT HISTORY: Patient Profile:   Jasmine Morrow is a 77 y.o. female Chief Complaint  Patient presents with  Visit Follow Up  Doing well.   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 Cardiac cath 10/2011 showed completely normal coronary arteries, however she did have mild left ventricular dysfunction with anterior wall motion abnormality. Left ventricular EF was 51%.  History of chickenpox  Hyperlipidemia  patient states she has never been diagnosed with this   Hypertension  Knee joint replacement by other means  Macular degeneration  Major depressive disorder, recurrent, mild (CMS-HCC)  Medicare annual wellness visit, initial 08/09/2019  7/21  Multiple thyroid nodules  Normal coronary arteries 09/06/2013  Normal coronary anatomy by cardiac catheterization 10/12/11  Osteoarthritis  Ovarian cyst  PONV (postoperative nausea and vomiting)  nausea  Postoperative urinary retention 12/08/2020  Rash on lips 12/21/2020  Seasonal allergies  Sinusitis, unspecified  Sleep apnea  On CPAP.  Slow transit constipation 12/10/2020  Trimalleolar fracture of ankle, closed, right, initial encounter [id number]  Complicated by staph infection 1/21, 4 weeks IV Ancef, [place]  Ulcer   Past Surgical History:  Procedure Laterality Date  SALPINGO OOPHORECTOMY Bilateral 1993  Right total knee arthroplasty 10/11/2006  Dr Marry Guan  Left total knee arthroplasty 10/12/2007  Dr Marry Guan  COLONOSCOPY 07/30/2012  internal hemorrhoids, diverticulosis  ANKLE ARTHODESIS W/ ARTHROSCOPY Right 02/08/2019  03/14/2019 second surgery  COLONOSCOPY 02/22/2019  Blood in entire colon/Diverticulosis - Presumed diverticular bleed. No repeat recommended per TKT.  EGD 02/22/2019  Gastritis/gastric ulcers/Hiatal hernia/Otherwise normal - no repeat recommended per TKT.  ORIF ANKLE FRACTURE Right 03/14/2019  Dr. Rudene Christians  REMOVAL HARDWARE ANKLE FOOT/TOES Right 11/12/2019  Dr. Rudene Christians  Right knee arthrotomy, irrigation and debridement of the right knee, polyethylene exchange 01/24/2020  Dr Marry Guan  Removal of hardware (plates and screws) from the right ankle with irrigation, debridement, and placement of Stimulan antibiotic beads 04/17/2020  Dr Marry Guan  Right knee arthrotomy, extensive irrigation debridement, removal of right total knee implants, and placement of antibiotic impregnated polymethylmethacrylate cement spacer 04/17/2020  Dr Marry Guan  AMPUTATION LEG ABOVE KNEE AKA Right 12/02/2020   Procedure: AMPUTATION, THIGH, THROUGH FEMUR, ANY LEVEL; Surgeon: Lyndle Herrlich, MD; Location: [place]; Service: Orthopedics; Laterality: Right;  TRANSFER ADJACENT TISSUE LEG Right 12/02/2020  Procedure: ADJACENT TISSUE TRANSFER OR REARRANGEMENT, LEG; DEFECT 10 SQ CM OR LESS; Surgeon: Lyndle Herrlich, MD; Location: [place]; Service: Orthopedics; Laterality: Right;  ASPIRATION/INJECTION MAJOR JOINT/BURSA KNEE Left 12/02/2020  Procedure: ARTHROCENTESIS, ASPIRATION AND/OR INJECTION, MAJOR JOINT OR BURSA, KNEE; WITHOUT ULTRASOUND GUIDANCE; Surgeon: Lyndle Herrlich, MD; Location: [place]; Service: Orthopedics; Laterality: Left;  REMOVAL KNEE PROSTHESIS W/POSSIBLE PLACEMENT SPACER Left 01/18/2021  Procedure: REMOVAL OF PROSTHESIS, INCLUDING TOTAL KNEE PROSTHESIS, METHYLMETHACRYLATE WITH OR WITHOUT INSERTION OF SPACER, KNEE; Surgeon: Ihor Dow, MD; Location: [place]; Service: Orthopedics; Laterality: Left;  INSERTION NON-BIODEGRADABLE DRUG DELIVERY IMPLANT Left 01/18/2021  Procedure: INSERTION, KNEE, bioresorbable, biodegradable, NON-BIODEGRADABLE DRUG DELIVERY IMPLANT; Surgeon: Ihor Dow, MD; Location: [place]; Service: Orthopedics; Laterality: Left;  ABOVE KNEE LEG AMPUTATION  12/02/2020  APPENDECTOMY Est 1968  CESAREAN SECTION  CHOLECYSTECTOMY  FRACTURE SURGERY 1/21  HYSTERECTOMY partial  JOINT REPLACEMENT 2008 & 2009  TUBAL LIGATION 3/79   ALLERGIES: Allergies  Allergen Reactions  Singulair [Montelukast] Other (See Comments)  Gland swelling  Sulfa (Sulfonamide Antibiotics) Swelling  Oxycodone Dizziness and Other (See Comments)  Vancomycin Analogues Other (See Comments)  Ringing in ears  Avinza [Morphine] Itching  Darvocet A500 [Propoxyphene N-Acetaminophen] Nausea  Nickel Rash  Ultracet [Tramadol-Acetaminophen] Rash  Vicodin [Hydrocodone-Acetaminophen] Vomiting  Vioxx [Rofecoxib] Other (See Comments)  GI    CURRENT MEDICATIONS: Current Outpatient Medications: acetaminophen (TYLENOL) 500 MG tablet, Take 1,000 mg by mouth 3 (three) times a day Twice daily per patient., PRN Not Currently Taking aspirin 81 MG EC tablet, Take 1 tablet (81 mg total) by mouth at bedtime, Taking calcium carbonate-vitamin D3 (CALTRATE 600+D) 600 mg-10 mcg (400 unit) tablet, Take 0.5 tablets by mouth 2 (two) times daily, Taking cetirizine (ZYRTEC) 10 mg capsule, Take 1 capsule (10 mg total) by mouth once daily, Taking escitalopram oxalate (LEXAPRO) 10 MG tablet, Take 1 tablet (10 mg total) by mouth once daily, Taking estradioL (ESTRACE) 0.01 % (0.1 mg/gram) vaginal cream, Place 2 g vaginally twice a week, Taking folic acid/multivit,iron,miner (MULTIVIT-IRON-MIN-FOLIC ACID ORAL), Take 1 tablet by mouth once daily, Taking gabapentin (NEURONTIN) 300 MG capsule, Take 1 capsule (300 mg total) by mouth 3 (three) times daily Phantom limb pain, Taking melatonin 3 mg tablet, Take 1 tablet (3 mg total) by mouth at bedtime, Taking multivitamin with iron (COMPLETE MULTIVITAMIN-MINERAL) tablet, Take 1 tablet by mouth, Taking multivitamin with minerals, EYE, (PRESERVISION AREDS 2) soft gel capsule, Take 1 capsule by mouth 2 (two) times daily, Taking nystatin (MYCOSTATIN) 100,000 unit/gram powder, Apply small amount topically twice daily for rash under breasts, PRN Not Currently Taking pantoprazole (PROTONIX) 40 MG DR tablet, Take 1 tablet (40 mg total) by mouth 2 (two) times daily. Please call to schedule an appt. Thanks!, Taking polyethylene glycol (MIRALAX) powder, Take 17 g by mouth once daily Mix in 4-8ounces of fluid prior to taking., Taking sennosides-docusate (SENOKOT-S) 8.6-50 mg  tablet, Take 1 tablet by mouth 2 (two) times daily For constipation, Taking lactose-reduced food (ENSURE PLUS ORAL), Take 237 mLs by mouth 2 (two) times daily With breakfast and dinner  HPI   CLINICAL SUMMARY:  Patient post right AKA and working with  prosthesis. 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 Able to do housework, improving  PAIN:  Are you having pain? Yes: NPRS scale: 5/10 Pain location: L knee Pain description: aching Aggravating factors: walking Relieving factors: rest  PRECAUTIONS: None  WEIGHT BEARING RESTRICTIONS No  FALLS:  Has patient fallen in last 6 months? No  LIVING ENVIRONMENT: Lives with: lives with their spouse Lives in: House/apartment Stairs: No Has following equipment at home: Gilford Rile - 4 wheeled and Wheelchair (manual)  OCCUPATION: retired  PLOF: Requires assistive device for independence  PATIENT GOALS  improve standing tolerance/ pull up pants/ ambulate with [place] safely.     OBJECTIVE:   PATIENT SURVEYS:  FOTO initial 46/ goal 24.   11/2: 50  COGNITION:  Overall cognitive status: Within functional limits for tasks assessed     SENSATION: WFL  POSTURE: rounded shoulders, forward head, flexed trunk , and weight shift left  PALPATION: No tenderness along R distal residual limb.  Discussed phantom limb sensation.    LOWER EXTREMITY ROM:  B LE AROM WFL except L knee extension secondary to articulating spacer.  Did not assess L/R hip extension at this time.    LOWER EXTREMITY MMT:  MMT Right eval Left eval  Hip flexion 4/5 4/5  Hip extension    Hip abduction 4+/5 4+/5  Hip adduction 4+/5 4+/5  Hip internal rotation    Hip external rotation    Knee flexion N/A 4+/5  Knee extension N/A 4+/5  Ankle dorsiflexion  5/5  Ankle plantarflexion    Ankle inversion    Ankle eversion     (Blank rows = not tested)  GAIT: Distance walked: in gym/ //-bars Assistive device utilized: Environmental consultant - 2 wheeled Level of assistance: CGA Comments: Cuing for posture correction with mirror feedback.  Flexed posture/ heavy UE assist.     TODAY'S TREATMENT:  04/12/22  Subjective:  Pt. Reports no new complaints.  Pt. Arrived in w/c and ready to work on gait/ standing  balance.   Pt. Return to eye doctor tomorrow to discuss glasses.    Objective:   There.ex.:  No charge   Nustep L5 10 min. L UE/B LE warm-up prior to gait.  Improvement in prosthetic fit after Nustep.       Seated 3# UE ex.: bicep curls/ chest press/ shoulder flexion 10x2.              Gait training:    Amb. From lobby to gym with use of RW and focus on step through gait. and heel strike during and after the gait.  Cuing to increase R LE step length while maintaining consistent BOS/ heel strike.       Ascend/descend 4 stairs with step to pattern with B UE assist x2.  Marked B shoulder/ UE muscle fatigue. Amb. From stairs to //-bars.  Lateral walking in //-bars 4 laps each.  Improved upright posture/ head position with mirror feedback.  Increase L knee discomfort report.   Walking from //-bars to car in front of clinic with consistent recip step pattern/ good R knee mechanism control.  1 standing rest break in hallway while walking to car.     PATIENT EDUCATION:  Education details:  HEP/ gait and prosthetic training.  Person educated: Patient and Spouse Education method: Explanation, Demonstration, and Verbal cues Education comprehension: verbalized understanding, returned demonstration, and tactile cues required   HOME EXERCISE PROGRAM: Prone position hip stretches/ Standing marching at kitchen counter with w/c behind patient.      ASSESSMENT:  CLINICAL IMPRESSION: Pt. Presents to PT in w/c and brought prosthetic leg.  Pt. Highly motivated to become more independent with use of RW while walking short distances in gym.  Pt. Demonstrates ability to walk without PT assist and demonstrate to pt. Husband while he follows pt. With w/c.  Pt. Able to transfer in/out of passenger side of car with mod. I and manage prosthesis. Pt. Will benefit from skilled PT services to improve standing tolerance/ gait independence and ADL.    OBJECTIVE IMPAIRMENTS Abnormal gait, decreased activity  tolerance, decreased balance, decreased endurance, decreased mobility, difficulty walking, decreased ROM, decreased strength, decreased safety awareness, impaired flexibility, improper body mechanics, prosthetic dependency , and pain.   ACTIVITY LIMITATIONS carrying, lifting, standing, squatting, stairs, transfers, bed mobility, bathing, toileting, dressing, hygiene/grooming, and locomotion level  PARTICIPATION LIMITATIONS: cleaning, laundry, driving, shopping, community activity, and yard work  [place] Past/current experiences are also affecting patient's functional outcome.   REHAB POTENTIAL: Good  CLINICAL DECISION MAKING: Evolving/moderate complexity  EVALUATION COMPLEXITY: High   GOALS: Goals reviewed with patient Yes  LONG TERM GOALS: Target date: 05/24/22  Pt. Will increase FOTO to 54 to improve functional mobility.  Baseline: initial FOTO 46.  11/2: 50 Goal status: Partially met  2.  Pt. Able to tolerate standing 10 minutes while pulling up pants with mod. I safely while wearing prosthetic leg to improve daily activities.  Baseline:  limited standing tolerance Goal status: Not met  3.  Pt. Will ambulate 200 feet with use of SPC and mod. I to promote safety with household tasks/ walking into bathroom.   Baseline:  amb. With RW and min. A for safety.  2/27: walking in clinic with SBA/mod. I for 100 feet with RW and use of prosthetic leg  Goal status: Not met  4.  Pt. Will ascend 10 stairs with use of single forearm crutch/ handrail and step to pattern with mod. I safely.  Baseline:  see above Goal status: Initial    PLAN: PT FREQUENCY: 2x/week  PT DURATION: 8 weeks  PLANNED INTERVENTIONS: Therapeutic exercises, Therapeutic activity, Neuromuscular re-education, Balance training, Gait training, Patient/Family education, Self Care, Joint mobilization, Prosthetic training, DME instructions, and Manual therapy  PLAN FOR NEXT SESSION:  Progress gait/ standing  tolerance.   Pura Spice, PT, DPT # [id number] 1:44 PM,04/12/22

## 2022-04-14 ENCOUNTER — Ambulatory Visit: Payer: Medicare Other | Admitting: Physical Therapy

## 2022-04-14 DIAGNOSIS — R293 Abnormal posture: Secondary | ICD-10-CM

## 2022-04-14 DIAGNOSIS — M6281 Muscle weakness (generalized): Secondary | ICD-10-CM

## 2022-04-14 DIAGNOSIS — R269 Unspecified abnormalities of gait and mobility: Secondary | ICD-10-CM

## 2022-04-14 DIAGNOSIS — Z89611 Acquired absence of right leg above knee: Secondary | ICD-10-CM | POA: Diagnosis not present

## 2022-04-14 NOTE — Therapy (Signed)
OUTPATIENT PHYSICAL THERAPY LOWER EXTREMITY TREATMENT  Patient Name: Jasmine Morrow MRN: [id number] DOB:06-Oct-1945, 77 y.o., female Today's Date: 04/14/2022   PT End of Session - 04/14/22 1503     Visit Number 45    Number of Visits 56    Date for PT Re-Evaluation 05/24/22    PT Start Time 1508    Equipment Utilized During Treatment Gait belt            1508 to 1602  (54 minutes).    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   Hypertension    PONV (postoperative nausea and vomiting)    Seasonal allergies    Sleep apnea    does not uses CPAP machine anymore    Wheelchair dependent    able to self transfer   Past Surgical History:  Procedure Laterality Date   APPENDECTOMY     CATARACT EXTRACTION W/PHACO Right 02/28/2022   Procedure: CATARACT EXTRACTION PHACO AND INTRAOCULAR LENS PLACEMENT (I[facility]) RIGHT  4.82  00:30.9;  Surgeon: Eulogio Bear, MD;  Location: [facility];  Service: Ophthalmology;  Laterality: Right;  sleep apnea   CATARACT EXTRACTION W/PHACO Left 03/14/2022   Procedure: CATARACT EXTRACTION PHACO AND INTRAOCULAR LENS PLACEMENT ([place]) LEFT;  Surgeon: Eulogio Bear, MD;  Location: [facility];  Service: Ophthalmology;  Laterality: Left;  4.32 0:39.7   CESAREAN SECTION     CHOLECYSTECTOMY     DILATION AND CURETTAGE OF UTERUS     ESOPHAGOGASTRODUODENOSCOPY N/A 02/22/2019   Procedure: ESOPHAGOGASTRODUODENOSCOPY (EGD);  Surgeon: Toledo, Benay Pike, MD;  Location: [facility];  Service: Gastroenterology;  Laterality: N/A;   FLEXIBLE SIGMOIDOSCOPY N/A 02/22/2019   Procedure: FLEXIBLE SIGMOIDOSCOPY;  Surgeon: Toledo, Benay Pike, MD;  Location: [facility];  Service: Gastroenterology;  Laterality: N/A;   HARDWARE REMOVAL Right 11/12/2019   Procedure: Right ankle hardware removal;  Surgeon: Hessie Knows,  MD;  Location: [facility];  Service: Orthopedics;  Laterality: Right;   HARDWARE REMOVAL Right 04/17/2020   Procedure: HARDWARE REMOVAL FROM RIGHT ANKLE AND RIGHT KNEE; IMPLANT OF CEMENT SPACERS IN RIGHT KNEE;  Surgeon: Dereck Leep, MD;  Location: [facility];  Service: Orthopedics;  Laterality: Right;   JOINT REPLACEMENT     LEG AMPUTATION ABOVE KNEE Right 12/02/2020   [facility]   ORIF ANKLE FRACTURE Right 03/14/2019   Procedure: OPEN REDUCTION INTERNAL FIXATION (ORIF) ANKLE FRACTURE, MEDIAL MALLEOLUS;  Surgeon: Hessie Knows, MD;  Location: [facility];  Service: Orthopedics;  Laterality: Right;   ORIF ANKLE FRACTURE Right 02/08/2019   Procedure: OPEN REDUCTION INTERNAL FIXATION (ORIF) ANKLE FRACTURE, post malleolus;  Surgeon: Hessie Knows, MD;  Location: [facility];  Service: Orthopedics;  Laterality: Right;   PARTIAL HYSTERECTOMY  1993   REPLACEMENT TOTAL KNEE Bilateral 2008   2009   SCAR DEBRIDEMENT OF TOTAL KNEE  01/2020   SYNDESMOSIS REPAIR Right 02/08/2019   Procedure: SYNDESMOSIS REPAIR;  Surgeon: Hessie Knows, MD;  Location: [facility];  Service: Orthopedics;  Laterality: Right;   TEE WITHOUT CARDIOVERSION N/A 02/26/2019   Procedure: TRANSESOPHAGEAL ECHOCARDIOGRAM (TEE);  Surgeon: Corey Skains, MD;  Location: [facility];  Service: Cardiovascular;  Laterality: N/A;   TOTAL KNEE REVISION WITH SCAR DEBRIDEMENT/PATELLA REVISION WITH POLY EXCHANGE Right 01/24/2020   Procedure: TOTAL KNEE REVISION WITH SCAR DEBRIDEMENT/PATELLA REVISION WITH POLY EXCHANGE;  Surgeon:  Hooten, Laurice Record, MD;  Location: [facility];  Service: Orthopedics;  Laterality: Right;   TUMOR REMOVAL     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   Patient Active Problem List   Diagnosis Date Noted   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[place]) 04/17/2020   Anxiety 04/17/2020   History of GI diverticular bleed 04/17/2020   Chest pain 04/17/2020   PAD (peripheral artery disease) ([place]) 04/05/2020   Ulcer of right leg ([place]) 04/05/2020    COPD (chronic obstructive pulmonary disease) ([place]) 04/05/2020   GERD (gastroesophageal reflux disease) 04/05/2020   Abscess of right knee 01/22/2020   Pressure injury of skin 02/28/2019   SOB (shortness of breath)    Gastrointestinal hemorrhage    Sepsis ([place])    Symptomatic anemia 02/20/2019   Trimalleolar fracture of ankle, closed, right, initial encounter 02/07/2019   Multiple lung nodules 03/31/2014   Extrinsic asthma 03/27/2014   OSA on CPAP 03/27/2014    PCP: Rusty Aus, MD  REFERRING PROVIDER: Rusty Aus, MD  REFERRING DIAG: Above knee amputation of right lower extremity  THERAPY DIAG:  S/P AKA (above knee amputation) unilateral, right (HCC)  Muscle weakness (generalized)  Gait difficulty  Abnormal posture  Rationale for Evaluation and Treatment Rehabilitation  ONSET DATE: 12/02/20  SUBJECTIVE:  EVALUATION  PERTINENT HISTORY: Patient Profile:   Jasmine Morrow is a 77 y.o. female Chief Complaint  Patient presents with  Visit Follow Up  Doing well.   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 Cardiac cath 10/2011 showed completely normal coronary arteries, however she did have mild left ventricular dysfunction with anterior wall motion abnormality. Left ventricular EF was 51%.  History of chickenpox  Hyperlipidemia  patient states she has never been diagnosed with  this  Hypertension  Knee joint replacement by other means  Macular degeneration  Major depressive disorder, recurrent, mild (CMS-HCC)  Medicare annual wellness visit, initial 08/09/2019  7/21  Multiple thyroid nodules  Normal coronary arteries 09/06/2013  Normal coronary anatomy by cardiac catheterization 10/12/11  Osteoarthritis  Ovarian cyst  PONV (postoperative nausea and vomiting)  nausea  Postoperative urinary retention 12/08/2020  Rash on lips 12/21/2020  Seasonal allergies  Sinusitis, unspecified  Sleep apnea  On CPAP.  Slow transit constipation 12/10/2020  Trimalleolar fracture of ankle, closed, right, initial encounter [id number]  Complicated by staph infection 1/21, 4 weeks IV Ancef, [facility]  Ulcer   Past Surgical History:  Procedure Laterality Date  SALPINGO OOPHORECTOMY Bilateral 1993  Right total knee arthroplasty 10/11/2006  Dr Marry Guan  Left total knee arthroplasty 10/12/2007  Dr Marry Guan  COLONOSCOPY 07/30/2012  internal hemorrhoids, diverticulosis  ANKLE ARTHODESIS W/ ARTHROSCOPY Right 02/08/2019  03/14/2019 second surgery  COLONOSCOPY 02/22/2019  Blood in entire colon/Diverticulosis - Presumed diverticular bleed. No repeat recommended per TKT.  EGD 02/22/2019  Gastritis/gastric ulcers/Hiatal hernia/Otherwise normal - no repeat recommended per TKT.  ORIF ANKLE FRACTURE Right 03/14/2019  Dr. Rudene Christians  REMOVAL HARDWARE ANKLE FOOT/TOES Right 11/12/2019  Dr. Rudene Christians  Right knee arthrotomy, irrigation and debridement of the right knee, polyethylene exchange 01/24/2020  Dr Marry Guan  Removal of hardware (plates and screws) from the right ankle with irrigation, debridement, and placement of Stimulan antibiotic beads 04/17/2020  Dr Marry Guan  Right knee arthrotomy, extensive irrigation debridement, removal of right total knee implants, and placement of antibiotic impregnated polymethylmethacrylate cement spacer 04/17/2020  Dr Marry Guan  AMPUTATION LEG ABOVE KNEE AKA Right 12/02/2020   Procedure: AMPUTATION, THIGH, THROUGH FEMUR, ANY LEVEL; Surgeon: Lyndle Herrlich, MD; Location: [facility]; Service: Orthopedics; Laterality: Right;  TRANSFER ADJACENT TISSUE LEG Right 12/02/2020  Procedure: ADJACENT TISSUE TRANSFER OR REARRANGEMENT, LEG; DEFECT 10 SQ CM OR LESS; Surgeon: Lyndle Herrlich, MD; Location: [facility]; Service: Orthopedics; Laterality: Right;  ASPIRATION/INJECTION MAJOR JOINT/BURSA KNEE Left 12/02/2020  Procedure: ARTHROCENTESIS, ASPIRATION AND/OR INJECTION, MAJOR JOINT OR BURSA, KNEE; WITHOUT ULTRASOUND GUIDANCE; Surgeon: Lyndle Herrlich, MD; Location: [place] [facility]; Service: Orthopedics; Laterality: Left;  REMOVAL KNEE PROSTHESIS W/POSSIBLE PLACEMENT SPACER Left 01/18/2021  Procedure: REMOVAL OF PROSTHESIS, INCLUDING TOTAL KNEE PROSTHESIS, METHYLMETHACRYLATE WITH OR WITHOUT INSERTION OF SPACER, KNEE; Surgeon: Ihor Dow, MD; Location: [facility]; Service: Orthopedics; Laterality: Left;  INSERTION NON-BIODEGRADABLE DRUG DELIVERY IMPLANT Left 01/18/2021  Procedure: INSERTION, KNEE, bioresorbable, biodegradable, NON-BIODEGRADABLE DRUG DELIVERY IMPLANT; Surgeon: Ihor Dow, MD; Location: [facility]; Service: Orthopedics; Laterality: Left;  ABOVE KNEE LEG AMPUTATION  12/02/2020  APPENDECTOMY Est 1968  CESAREAN SECTION  CHOLECYSTECTOMY  FRACTURE SURGERY 1/21  HYSTERECTOMY partial  JOINT REPLACEMENT 2008 & 2009  TUBAL LIGATION 3/79   ALLERGIES: Allergies  Allergen Reactions  Singulair [Montelukast] Other (See Comments)  Gland swelling  Sulfa (Sulfonamide Antibiotics) Swelling  Oxycodone Dizziness and Other (See Comments)  Vancomycin Analogues Other (See Comments)  Ringing in ears  Avinza [Morphine] Itching  Darvocet A500 [Propoxyphene N-Acetaminophen] Nausea  Nickel Rash  Ultracet [Tramadol-Acetaminophen] Rash  Vicodin [Hydrocodone-Acetaminophen] Vomiting  Vioxx [Rofecoxib] Other (See Comments)  GI    CURRENT MEDICATIONS: Current Outpatient Medications: acetaminophen (TYLENOL) 500 MG tablet, Take 1,000 mg by mouth 3 (three) times a day Twice daily per patient., PRN Not Currently Taking aspirin 81 MG EC tablet, Take 1 tablet (81 mg total) by mouth at bedtime, Taking calcium carbonate-vitamin D3 (CALTRATE 600+D) 600 mg-10 mcg (400 unit) tablet, Take 0.5 tablets by mouth 2 (two) times daily, Taking cetirizine (ZYRTEC) 10 mg capsule, Take 1 capsule (10 mg total) by mouth once daily, Taking escitalopram oxalate (LEXAPRO) 10 MG tablet, Take 1 tablet (10 mg total) by mouth once daily, Taking estradioL (ESTRACE) 0.01 % (0.1 mg/gram) vaginal cream, Place 2 g vaginally twice a week, Taking folic acid/multivit,iron,miner (MULTIVIT-IRON-MIN-FOLIC ACID ORAL), Take 1 tablet by mouth once daily, Taking gabapentin (NEURONTIN) 300 MG capsule, Take 1 capsule (300 mg total) by mouth 3 (three) times daily Phantom limb pain, Taking melatonin 3 mg tablet, Take 1 tablet (3 mg total) by mouth at bedtime, Taking multivitamin with iron (COMPLETE MULTIVITAMIN-MINERAL) tablet, Take 1 tablet by mouth, Taking multivitamin with minerals, EYE, (PRESERVISION AREDS 2) soft gel capsule, Take 1 capsule by mouth 2 (two) times daily, Taking nystatin (MYCOSTATIN) 100,000 unit/gram powder, Apply small amount topically twice daily for rash under breasts, PRN Not Currently Taking pantoprazole (PROTONIX) 40 MG DR tablet, Take 1 tablet (40 mg total) by mouth 2 (two) times daily. Please call to schedule an appt. Thanks!, Taking polyethylene glycol (MIRALAX) powder, Take 17 g by mouth once daily Mix in 4-8ounces of fluid prior to taking., Taking sennosides-docusate (  SENOKOT-S) 8.6-50 mg tablet, Take 1 tablet by mouth 2 (two) times daily For constipation, Taking lactose-reduced food (ENSURE PLUS ORAL), Take 237 mLs by mouth 2 (two) times daily With breakfast and dinner  HPI   CLINICAL SUMMARY:  Patient post right AKA and working with  prosthesis. 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 Able to do housework, improving  PAIN:  Are you having pain? Yes: NPRS scale: 5/10 Pain location: L knee Pain description: aching Aggravating factors: walking Relieving factors: rest  PRECAUTIONS: None  WEIGHT BEARING RESTRICTIONS No  FALLS:  Has patient fallen in last 6 months? No  LIVING ENVIRONMENT: Lives with: lives with their spouse Lives in: House/apartment Stairs: No Has following equipment at home: Gilford Rile - 4 wheeled and Wheelchair (manual)  OCCUPATION: retired  PLOF: Requires assistive device for independence  PATIENT GOALS  improve standing tolerance/ pull up pants/ ambulate with [place] safely.     OBJECTIVE:   PATIENT SURVEYS:  FOTO initial 46/ goal 58.   11/2: 50  COGNITION:  Overall cognitive status: Within functional limits for tasks assessed     SENSATION: WFL  POSTURE: rounded shoulders, forward head, flexed trunk , and weight shift left  PALPATION: No tenderness along R distal residual limb.  Discussed phantom limb sensation.    LOWER EXTREMITY ROM:  B LE AROM WFL except L knee extension secondary to articulating spacer.  Did not assess L/R hip extension at this time.    LOWER EXTREMITY MMT:  MMT Right eval Left eval  Hip flexion 4/5 4/5  Hip extension    Hip abduction 4+/5 4+/5  Hip adduction 4+/5 4+/5  Hip internal rotation    Hip external rotation    Knee flexion N/A 4+/5  Knee extension N/A 4+/5  Ankle dorsiflexion  5/5  Ankle plantarflexion    Ankle inversion    Ankle eversion     (Blank rows = not tested)  GAIT: Distance walked: in gym/ //-bars Assistive device utilized: Environmental consultant - 2 wheeled Level of assistance: CGA Comments: Cuing for posture correction with mirror feedback.  Flexed posture/ heavy UE assist.     TODAY'S TREATMENT:  04/14/22  Subjective:  Pt. Reports no new complaints.  Pt. Arrived in w/c and ready to work on gait/ standing  balance.   Pt. Using prednisone eye drops to manage swelling in eye.  Pt. Has not been fitted for glasses yet for distance vision.      Objective:   There.ex.:  No charge   Nustep L5 10 min. L UE/B LE warm-up prior to gait.  Improvement in prosthetic fit after Nustep.       Seated 3# UE ex.: bicep curls/ chest press/ shoulder flexion 10x2.              Gait training:    Amb. From lobby to gym with use of RW and focus on step through gait. and heel strike during and after the gait.  Cuing to increase R LE step length while maintaining consistent BOS/ heel strike.       Walking in //-bars with focus on upright posture/ R step length to ensure heel strike/ full knee extension prior to wt. Bearing.  3 laps with heavy UE assist.    Walking around PT clinic/ hallway (105 feet/ 75 feet) working on consistent recip step pattern/ good R knee mechanism control.  1 seated rest break in hallway while walking to side door.     Amb. 15  feet in grass with use of RW and CGA for safety.  Focus on R hip flexion/ step pattern to decrease fall risk.     PATIENT EDUCATION:  Education details: HEP/ gait and prosthetic training.  Person educated: Patient and Spouse Education method: Explanation, Demonstration, and Verbal cues Education comprehension: verbalized understanding, returned demonstration, and tactile cues required   HOME EXERCISE PROGRAM: Prone position hip stretches/ Standing marching at kitchen counter with w/c behind patient.      ASSESSMENT:  CLINICAL IMPRESSION: Pt. Presents to PT in w/c and brought prosthetic leg.  Pt. Highly motivated to become more independent with use of RW while walking short distances in gym.  Pt. Ambulates short distance in grass at side of clinic to ensure safety/ mod. Independence if walking outside at home.  Pt. Will benefit from skilled PT services to improve standing tolerance/ gait independence and ADL.    OBJECTIVE IMPAIRMENTS Abnormal gait, decreased  activity tolerance, decreased balance, decreased endurance, decreased mobility, difficulty walking, decreased ROM, decreased strength, decreased safety awareness, impaired flexibility, improper body mechanics, prosthetic dependency , and pain.   ACTIVITY LIMITATIONS carrying, lifting, standing, squatting, stairs, transfers, bed mobility, bathing, toileting, dressing, hygiene/grooming, and locomotion level  PARTICIPATION LIMITATIONS: cleaning, laundry, driving, shopping, community activity, and yard work  [place] Past/current experiences are also affecting patient's functional outcome.   REHAB POTENTIAL: Good  CLINICAL DECISION MAKING: Evolving/moderate complexity  EVALUATION COMPLEXITY: High   GOALS: Goals reviewed with patient Yes  LONG TERM GOALS: Target date: 05/24/22  Pt. Will increase FOTO to 54 to improve functional mobility.  Baseline: initial FOTO 46.  11/2: 50 Goal status: Partially met  2.  Pt. Able to tolerate standing 10 minutes while pulling up pants with mod. I safely while wearing prosthetic leg to improve daily activities.  Baseline:  limited standing tolerance Goal status: Not met  3.  Pt. Will ambulate 200 feet with use of SPC and mod. I to promote safety with household tasks/ walking into bathroom.   Baseline:  amb. With RW and min. A for safety.  2/27: walking in clinic with SBA/mod. I for 100 feet with RW and use of prosthetic leg  Goal status: Not met  4.  Pt. Will ascend 10 stairs with use of single forearm crutch/ handrail and step to pattern with mod. I safely.  Baseline:  see above Goal status: Initial    PLAN: PT FREQUENCY: 2x/week  PT DURATION: 8 weeks  PLANNED INTERVENTIONS: Therapeutic exercises, Therapeutic activity, Neuromuscular re-education, Balance training, Gait training, Patient/Family education, Self Care, Joint mobilization, Prosthetic training, DME instructions, and Manual therapy  PLAN FOR NEXT SESSION:  Progress gait/  standing tolerance.   Pura Spice, PT, DPT # [phone] 3:22 PM,04/14/22

## 2022-04-19 ENCOUNTER — Ambulatory Visit: Payer: Medicare Other | Admitting: Physical Therapy

## 2022-04-21 ENCOUNTER — Ambulatory Visit: Payer: Medicare Other | Admitting: Physical Therapy

## 2022-04-21 DIAGNOSIS — R293 Abnormal posture: Secondary | ICD-10-CM

## 2022-04-21 DIAGNOSIS — R269 Unspecified abnormalities of gait and mobility: Secondary | ICD-10-CM

## 2022-04-21 DIAGNOSIS — Z89611 Acquired absence of right leg above knee: Secondary | ICD-10-CM | POA: Diagnosis not present

## 2022-04-21 DIAGNOSIS — M6281 Muscle weakness (generalized): Secondary | ICD-10-CM

## 2022-04-21 NOTE — Therapy (Signed)
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   Hypertension    PONV (postoperative nausea and vomiting)    Seasonal allergies    Sleep apnea    does not uses CPAP machine anymore    Wheelchair dependent    able to self transfer   Past Surgical History:  Procedure Laterality Date   APPENDECTOMY     CATARACT EXTRACTION W/PHACO Right 02/28/2022   Procedure: CATARACT EXTRACTION PHACO AND INTRAOCULAR LENS PLACEMENT ([facility]) RIGHT  4.82  00:30.9;  Surgeon: Eulogio Bear, MD;  Location: [place];  Service: Ophthalmology;  Laterality: Right;  sleep apnea   CATARACT EXTRACTION W/PHACO Left 03/14/2022   Procedure: CATARACT EXTRACTION PHACO AND INTRAOCULAR LENS PLACEMENT ([place]) LEFT;  Surgeon: Eulogio Bear, MD;  Location: [place];  Service: Ophthalmology;  Laterality: Left;  4.32 0:39.7   CESAREAN SECTION     CHOLECYSTECTOMY     DILATION AND CURETTAGE OF UTERUS     ESOPHAGOGASTRODUODENOSCOPY N/A 02/22/2019   Procedure: ESOPHAGOGASTRODUODENOSCOPY (EGD);  Surgeon: Toledo, Benay Pike, MD;  Location: [facility];  Service: Gastroenterology;  Laterality: N/A;   FLEXIBLE SIGMOIDOSCOPY N/A 02/22/2019   Procedure: FLEXIBLE SIGMOIDOSCOPY;  Surgeon: Toledo, Benay Pike, MD;  Location: [facility];  Service: Gastroenterology;  Laterality: N/A;   HARDWARE REMOVAL Right 11/12/2019   Procedure: Right ankle hardware removal;   Surgeon: Hessie Knows, MD;  Location: [facility];  Service: Orthopedics;  Laterality: Right;   HARDWARE REMOVAL Right 04/17/2020   Procedure: HARDWARE REMOVAL FROM RIGHT ANKLE AND RIGHT KNEE; IMPLANT OF CEMENT SPACERS IN RIGHT KNEE;  Surgeon: Dereck Leep, MD;  Location: [facility];  Service: Orthopedics;  Laterality: Right;   JOINT REPLACEMENT     LEG AMPUTATION ABOVE KNEE Right 12/02/2020   [facility]   ORIF ANKLE FRACTURE Right 03/14/2019   Procedure: OPEN REDUCTION INTERNAL FIXATION (ORIF) ANKLE FRACTURE, MEDIAL MALLEOLUS;  Surgeon: Hessie Knows, MD;  Location: [facility];  Service: Orthopedics;  Laterality: Right;   ORIF ANKLE FRACTURE Right 02/08/2019   Procedure: OPEN REDUCTION INTERNAL FIXATION (ORIF) ANKLE FRACTURE, post malleolus;  Surgeon: Hessie Knows, MD;  Location: [facility];  Service: Orthopedics;  Laterality: Right;   PARTIAL HYSTERECTOMY  1993   REPLACEMENT TOTAL KNEE Bilateral 2008   2009   SCAR DEBRIDEMENT OF TOTAL KNEE  01/2020   SYNDESMOSIS REPAIR Right 02/08/2019   Procedure: SYNDESMOSIS REPAIR;  Surgeon: Hessie Knows, MD;  Location: [facility];  Service: Orthopedics;  Laterality: Right;   TEE WITHOUT CARDIOVERSION N/A 02/26/2019   Procedure: TRANSESOPHAGEAL ECHOCARDIOGRAM (TEE);  Surgeon: Corey Skains, MD;  Location: [facility];  Service: Cardiovascular;  Laterality: N/A;   TOTAL KNEE REVISION WITH SCAR DEBRIDEMENT/PATELLA REVISION WITH POLY EXCHANGE Right 01/24/2020   Procedure: TOTAL KNEE REVISION WITH SCAR  DEBRIDEMENT/PATELLA REVISION WITH POLY EXCHANGE;  Surgeon: Dereck Leep, MD;  Location: [facility];  Service: Orthopedics;  Laterality: Right;   TUMOR REMOVAL     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   Patient Active Problem List   Diagnosis Date Noted   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[place]) 04/17/2020   Anxiety 04/17/2020   History of GI diverticular bleed 04/17/2020   Chest pain 04/17/2020   PAD (peripheral artery disease) ([place]) 04/05/2020   Ulcer of right  leg ([place]) 04/05/2020   COPD (chronic obstructive pulmonary disease) ([place]) 04/05/2020   GERD (gastroesophageal reflux disease) 04/05/2020   Abscess of right knee 01/22/2020   Pressure injury of skin 02/28/2019   SOB (shortness of breath)    Gastrointestinal hemorrhage    Sepsis ([place])    Symptomatic anemia 02/20/2019   Trimalleolar fracture of ankle, closed, right, initial encounter 02/07/2019   Multiple lung nodules 03/31/2014   Extrinsic asthma 03/27/2014   OSA on CPAP 03/27/2014    PCP: Rusty Aus, MD  REFERRING PROVIDER: Rusty Aus, MD  REFERRING DIAG: Above knee amputation of right lower extremity  THERAPY DIAG:  S/P AKA (above knee amputation) unilateral, right (HCC)  Muscle weakness (generalized)  Gait difficulty  Abnormal posture  Rationale for Evaluation and Treatment Rehabilitation  ONSET DATE: 12/02/20  SUBJECTIVE:  EVALUATION  PERTINENT HISTORY: Patient Profile:   [name] is a 77 y.o. female Chief Complaint  Patient presents with  Visit Follow Up  Doing well.   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 Cardiac cath 10/2011 showed completely normal coronary arteries, however she did have mild left ventricular dysfunction with anterior wall motion abnormality. Left ventricular EF was 51%.  History of chickenpox  Hyperlipidemia  patient states she has  never been diagnosed with this  Hypertension  Knee joint replacement by other means  Macular degeneration  Major depressive disorder, recurrent, mild (CMS-HCC)  Medicare annual wellness visit, initial 08/09/2019  7/21  Multiple thyroid nodules  Normal coronary arteries 09/06/2013  Normal coronary anatomy by cardiac catheterization 10/12/11  Osteoarthritis  Ovarian cyst  PONV (postoperative nausea and vomiting)  nausea  Postoperative urinary retention 12/08/2020  Rash on lips 12/21/2020  Seasonal allergies  Sinusitis, unspecified  Sleep apnea  On CPAP.  Slow transit constipation 12/10/2020  Trimalleolar fracture of ankle, closed, right, initial encounter [id number]  Complicated by staph infection 1/21, 4 weeks IV Ancef, [place]  Ulcer   Past Surgical History:  Procedure Laterality Date  SALPINGO OOPHORECTOMY Bilateral 1993  Right total knee arthroplasty 10/11/2006  Dr Marry Guan  Left total knee arthroplasty 10/12/2007  Dr Marry Guan  COLONOSCOPY 07/30/2012  internal hemorrhoids, diverticulosis  ANKLE ARTHODESIS W/ ARTHROSCOPY Right 02/08/2019  03/14/2019 second surgery  COLONOSCOPY 02/22/2019  Blood in entire colon/Diverticulosis - Presumed diverticular bleed. No repeat recommended per TKT.  EGD 02/22/2019  Gastritis/gastric ulcers/Hiatal hernia/Otherwise normal - no repeat recommended per TKT.  ORIF ANKLE FRACTURE Right 03/14/2019  Dr. Rudene Christians  REMOVAL HARDWARE ANKLE FOOT/TOES Right 11/12/2019  Dr. Rudene Christians  Right knee arthrotomy, irrigation and debridement of the right knee, polyethylene exchange 01/24/2020  Dr Marry Guan  Removal of hardware (plates and screws) from the right ankle with irrigation, debridement, and placement of Stimulan antibiotic beads 04/17/2020  Dr Marry Guan  Right knee arthrotomy, extensive irrigation debridement, removal of right total knee implants, and placement of antibiotic impregnated polymethylmethacrylate cement spacer 04/17/2020  Dr Marry Guan  AMPUTATION LEG ABOVE  KNEE AKA Right 12/02/2020  Procedure: AMPUTATION, THIGH, THROUGH FEMUR, ANY LEVEL; Surgeon: Lyndle Herrlich, MD; Location: [place]; Service: Orthopedics; Laterality: Right;  TRANSFER ADJACENT TISSUE LEG Right 12/02/2020  Procedure: ADJACENT TISSUE TRANSFER OR REARRANGEMENT, LEG; DEFECT 10 SQ CM OR LESS; Surgeon: Lyndle Herrlich, MD; Location: [place]; Service: Orthopedics; Laterality: Right;  ASPIRATION/INJECTION MAJOR JOINT/BURSA KNEE Left 12/02/2020  Procedure: ARTHROCENTESIS, ASPIRATION AND/OR INJECTION, MAJOR JOINT OR BURSA, KNEE; WITHOUT ULTRASOUND GUIDANCE; Surgeon: Lyndle Herrlich, MD; Location: [place]; Service: Orthopedics; Laterality: Left;  REMOVAL KNEE PROSTHESIS W/POSSIBLE PLACEMENT SPACER Left 01/18/2021  Procedure: REMOVAL OF PROSTHESIS, INCLUDING TOTAL KNEE PROSTHESIS, METHYLMETHACRYLATE WITH OR WITHOUT INSERTION OF SPACER, KNEE; Surgeon: Ihor Dow, MD; Location: [place]; Service: Orthopedics; Laterality: Left;  INSERTION NON-BIODEGRADABLE DRUG DELIVERY IMPLANT Left 01/18/2021  Procedure: INSERTION, KNEE, bioresorbable, biodegradable, NON-BIODEGRADABLE DRUG DELIVERY IMPLANT; Surgeon: Ihor Dow, MD; Location: [place]; Service: Orthopedics; Laterality: Left;  ABOVE KNEE LEG AMPUTATION  12/02/2020  APPENDECTOMY Est 1968  CESAREAN SECTION  CHOLECYSTECTOMY  FRACTURE SURGERY 1/21  HYSTERECTOMY partial  JOINT REPLACEMENT 2008 & 2009  TUBAL LIGATION 3/79   ALLERGIES: Allergies  Allergen Reactions  Singulair [Montelukast] Other (See Comments)  Gland swelling  Sulfa (Sulfonamide Antibiotics) Swelling  Oxycodone Dizziness and Other (See Comments)  Vancomycin Analogues Other (See Comments)  Ringing in ears  Avinza [Morphine] Itching  Darvocet A500 [Propoxyphene N-Acetaminophen] Nausea  Nickel Rash  Ultracet [Tramadol-Acetaminophen] Rash  Vicodin [Hydrocodone-Acetaminophen] Vomiting  Vioxx [Rofecoxib] Other  (See Comments)  GI   CURRENT MEDICATIONS: Current Outpatient Medications: acetaminophen (TYLENOL) 500 MG tablet, Take 1,000 mg by mouth 3 (three) times a day Twice daily per patient., PRN Not Currently Taking aspirin 81 MG EC tablet, Take 1 tablet (81 mg total) by mouth at bedtime, Taking calcium carbonate-vitamin D3 (CALTRATE 600+D) 600 mg-10 mcg (400 unit) tablet, Take 0.5 tablets by mouth 2 (two) times daily, Taking cetirizine (ZYRTEC) 10 mg capsule, Take 1 capsule (10 mg total) by mouth once daily, Taking escitalopram oxalate (LEXAPRO) 10 MG tablet, Take 1 tablet (10 mg total) by mouth once daily, Taking estradioL (ESTRACE) 0.01 % (0.1 mg/gram) vaginal cream, Place 2 g vaginally twice a week, Taking folic acid/multivit,iron,miner (MULTIVIT-IRON-MIN-FOLIC ACID ORAL), Take 1 tablet by mouth once daily, Taking gabapentin (NEURONTIN) 300 MG capsule, Take 1 capsule (300 mg total) by mouth 3 (three) times daily Phantom limb pain, Taking melatonin 3 mg tablet, Take 1 tablet (3 mg total) by mouth at bedtime, Taking multivitamin with iron (COMPLETE MULTIVITAMIN-MINERAL) tablet, Take 1 tablet by mouth, Taking multivitamin with minerals, EYE, (PRESERVISION AREDS 2) soft gel capsule, Take 1 capsule by mouth 2 (two) times daily, Taking nystatin (MYCOSTATIN) 100,000 unit/gram powder, Apply small amount topically twice daily for rash under breasts, PRN Not Currently Taking pantoprazole (PROTONIX) 40 MG DR tablet, Take 1 tablet (40 mg total) by mouth 2 (two) times daily. Please call to schedule an appt. Thanks!, Taking polyethylene glycol (MIRALAX) powder, Take 17 g by mouth once daily Mix in 4-8ounces  of fluid prior to taking., Taking sennosides-docusate (SENOKOT-S) 8.6-50 mg tablet, Take 1 tablet by mouth 2 (two) times daily For constipation, Taking lactose-reduced food (ENSURE PLUS ORAL), Take 237 mLs by mouth 2 (two) times daily With breakfast and dinner  HPI   CLINICAL SUMMARY:  Patient post right  AKA and working with prosthesis. 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 Able to do housework, improving  PAIN:  Are you having pain? Yes: NPRS scale: 5/10 Pain location: L knee Pain description: aching Aggravating factors: walking Relieving factors: rest  PRECAUTIONS: None  WEIGHT BEARING RESTRICTIONS No  FALLS:  Has patient fallen in last 6 months? No  LIVING ENVIRONMENT: Lives with: lives with their spouse Lives in: House/apartment Stairs: No Has following equipment at home: Gilford Rile - 4 wheeled and Wheelchair (manual)  OCCUPATION: retired  PLOF: Requires assistive device for independence  PATIENT GOALS  improve standing tolerance/ pull up pants/ ambulate with [place] safely.     OBJECTIVE:   PATIENT SURVEYS:  FOTO initial 46/ goal 29.   11/2: 50  COGNITION:  Overall cognitive status: Within functional limits for tasks assessed     SENSATION: WFL  POSTURE: rounded shoulders, forward head, flexed trunk , and weight shift left  PALPATION: No tenderness along R distal residual limb.  Discussed phantom limb sensation.    LOWER EXTREMITY ROM:  B LE AROM WFL except L knee extension secondary to articulating spacer.  Did not assess L/R hip extension at this time.    LOWER EXTREMITY MMT:  MMT Right eval Left eval  Hip flexion 4/5 4/5  Hip extension    Hip abduction 4+/5 4+/5  Hip adduction 4+/5 4+/5  Hip internal rotation    Hip external rotation    Knee flexion N/A 4+/5  Knee extension N/A 4+/5  Ankle dorsiflexion  5/5  Ankle plantarflexion    Ankle inversion    Ankle eversion     (Blank rows = not tested)  GAIT: Distance walked: in gym/ //-bars Assistive device utilized: Environmental consultant - 2 wheeled Level of assistance: CGA Comments: Cuing for posture correction with mirror feedback.  Flexed posture/ heavy UE assist.     TODAY'S TREATMENT:  04/21/2022  Subjective:  Pt. Reports no new complaints.  Pt. Arrived in w/c and ready to  work on gait/ standing balance.   Pt. Using prednisone eye drops to manage swelling in eye.  Pt. Has not been fitted for glasses yet for distance vision.      Objective:   There.ex.:  No charge   Nustep L5 10 min. L UE/B LE warm-up prior to gait.  Improvement in prosthetic fit after Nustep.       Seated marching in gray chair due rest break between walking sets in //-bars.                Gait training:    Amb. From blue mat to Nustep with use of RW and focus on step through gait. and heel strike during and after the gait.  Cuing to increase R LE step length while maintaining consistent BOS/ knee mechanism extension/ heel strike.       Walking in //-bars with focus on upright posture/ R step length to ensure heel strike/ full knee extension prior to wt. Bearing.  3 laps with heavy UE assist.  Added 3" plinth to encourage hip flexion/ step length.     Walking from //-bars to outside around front parking lot to car with  RW working on consistent recip step pattern/ good R knee mechanism control.  No seated rest break required today.  B UE muscle fatigue/ shoulder soreness reported.  No LOB   PATIENT EDUCATION:  Education details: HEP/ gait and prosthetic training.  Person educated: Patient and Spouse Education method: Explanation, Demonstration, and Verbal cues Education comprehension: verbalized understanding, returned demonstration, and tactile cues required   HOME EXERCISE PROGRAM: Prone position hip stretches/ Standing marching at kitchen counter with w/c behind patient.      ASSESSMENT:  CLINICAL IMPRESSION: Pt. Presents to PT in w/c and brought prosthetic leg.  Pt. Highly motivated to become more independent with use of RW while walking short distances in gym.  Pt. Ambulates from gym to outside with use of RW and SBA for safety with greater independence/ consistent R knee mechanism extension/ heel strike.  B UE/ shoulder fatigue with heavy UE assist with use of RW.  Pt. Will benefit  from skilled PT services to improve standing tolerance/ gait independence and ADL.    OBJECTIVE IMPAIRMENTS Abnormal gait, decreased activity tolerance, decreased balance, decreased endurance, decreased mobility, difficulty walking, decreased ROM, decreased strength, decreased safety awareness, impaired flexibility, improper body mechanics, prosthetic dependency , and pain.   ACTIVITY LIMITATIONS carrying, lifting, standing, squatting, stairs, transfers, bed mobility, bathing, toileting, dressing, hygiene/grooming, and locomotion level  PARTICIPATION LIMITATIONS: cleaning, laundry, driving, shopping, community activity, and yard work  [place] Past/current experiences are also affecting patient's functional outcome.   REHAB POTENTIAL: Good  CLINICAL DECISION MAKING: Evolving/moderate complexity  EVALUATION COMPLEXITY: High   GOALS: Goals reviewed with patient Yes  LONG TERM GOALS: Target date: 05/24/22  Pt. Will increase FOTO to 54 to improve functional mobility.  Baseline: initial FOTO 46.  11/2: 50 Goal status: Partially met  2.  Pt. Able to tolerate standing 10 minutes while pulling up pants with mod. I safely while wearing prosthetic leg to improve daily activities.  Baseline:  limited standing tolerance Goal status: Not met  3.  Pt. Will ambulate 200 feet with use of SPC and mod. I to promote safety with household tasks/ walking into bathroom.   Baseline:  amb. With RW and min. A for safety.  2/27: walking in clinic with SBA/mod. I for 100 feet with RW and use of prosthetic leg  Goal status: Not met  4.  Pt. Will ascend 10 stairs with use of single forearm crutch/ handrail and step to pattern with mod. I safely.  Baseline:  see above Goal status: Initial    PLAN: PT FREQUENCY: 2x/week  PT DURATION: 8 weeks  PLANNED INTERVENTIONS: Therapeutic exercises, Therapeutic activity, Neuromuscular re-education, Balance training, Gait training, Patient/Family education,  Self Care, Joint mobilization, Prosthetic training, DME instructions, and Manual therapy  PLAN FOR NEXT SESSION:  Progress gait/ standing tolerance.   Pura Spice, PT, DPT # [phone] 2:05 PM,04/21/22

## 2022-04-26 ENCOUNTER — Ambulatory Visit: Payer: Medicare Other | Admitting: Physical Therapy

## 2022-04-26 DIAGNOSIS — Z89611 Acquired absence of right leg above knee: Secondary | ICD-10-CM | POA: Diagnosis not present

## 2022-04-26 DIAGNOSIS — R269 Unspecified abnormalities of gait and mobility: Secondary | ICD-10-CM

## 2022-04-26 DIAGNOSIS — M6281 Muscle weakness (generalized): Secondary | ICD-10-CM

## 2022-04-26 DIAGNOSIS — R293 Abnormal posture: Secondary | ICD-10-CM

## 2022-04-26 NOTE — Therapy (Signed)
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   Hypertension    PONV (postoperative nausea and vomiting)    Seasonal allergies    Sleep apnea    does not uses CPAP machine anymore    Wheelchair dependent    able to self transfer   Past Surgical History:  Procedure Laterality Date   APPENDECTOMY     CATARACT EXTRACTION W/PHACO Right 02/28/2022   Procedure: CATARACT EXTRACTION PHACO AND INTRAOCULAR LENS PLACEMENT ([facility]) RIGHT  4.82  00:30.9;  Surgeon: Eulogio Bear, MD;  Location: [facility];  Service: Ophthalmology;  Laterality: Right;  sleep apnea   CATARACT EXTRACTION W/PHACO Left 03/14/2022   Procedure: CATARACT EXTRACTION PHACO AND INTRAOCULAR LENS PLACEMENT ([place]) LEFT;  Surgeon: Eulogio Bear, MD;  Location: [facility];  Service: Ophthalmology;  Laterality: Left;  4.32 0:39.7   CESAREAN SECTION     CHOLECYSTECTOMY     DILATION AND CURETTAGE OF UTERUS     ESOPHAGOGASTRODUODENOSCOPY N/A 02/22/2019   Procedure: ESOPHAGOGASTRODUODENOSCOPY (EGD);  Surgeon: Toledo, Benay Pike, MD;  Location: [facility];  Service: Gastroenterology;  Laterality: N/A;   FLEXIBLE SIGMOIDOSCOPY N/A 02/22/2019   Procedure: FLEXIBLE SIGMOIDOSCOPY;  Surgeon: Toledo, Benay Pike, MD;  Location: [facility];  Service: Gastroenterology;  Laterality: N/A;   HARDWARE REMOVAL Right 11/12/2019   Procedure: Right ankle hardware removal;   Surgeon: Hessie Knows, MD;  Location: [facility];  Service: Orthopedics;  Laterality: Right;   HARDWARE REMOVAL Right 04/17/2020   Procedure: HARDWARE REMOVAL FROM RIGHT ANKLE AND RIGHT KNEE; IMPLANT OF CEMENT SPACERS IN RIGHT KNEE;  Surgeon: Dereck Leep, MD;  Location: [facility];  Service: Orthopedics;  Laterality: Right;   JOINT REPLACEMENT     LEG AMPUTATION ABOVE KNEE Right 12/02/2020   [facility]   ORIF ANKLE FRACTURE Right 03/14/2019   Procedure: OPEN REDUCTION INTERNAL FIXATION (ORIF) ANKLE FRACTURE, MEDIAL MALLEOLUS;  Surgeon: Hessie Knows, MD;  Location: [facility];  Service: Orthopedics;  Laterality: Right;   ORIF ANKLE FRACTURE Right 02/08/2019   Procedure: OPEN REDUCTION INTERNAL FIXATION (ORIF) ANKLE FRACTURE, post malleolus;  Surgeon: Hessie Knows, MD;  Location: [facility];  Service: Orthopedics;  Laterality: Right;   PARTIAL HYSTERECTOMY  1993   REPLACEMENT TOTAL KNEE Bilateral 2008   2009   SCAR DEBRIDEMENT OF TOTAL KNEE  01/2020   SYNDESMOSIS REPAIR Right 02/08/2019   Procedure: SYNDESMOSIS REPAIR;  Surgeon: Hessie Knows, MD;  Location: [facility];  Service: Orthopedics;  Laterality: Right;   TEE WITHOUT CARDIOVERSION N/A 02/26/2019   Procedure: TRANSESOPHAGEAL ECHOCARDIOGRAM (TEE);  Surgeon: Corey Skains, MD;  Location: [facility];  Service: Cardiovascular;  Laterality: N/A;   TOTAL KNEE REVISION WITH SCAR DEBRIDEMENT/PATELLA REVISION WITH POLY EXCHANGE Right 01/24/2020   Procedure: TOTAL KNEE REVISION WITH SCAR  DEBRIDEMENT/PATELLA REVISION WITH POLY EXCHANGE;  Surgeon: Dereck Leep, MD;  Location: [facility];  Service: Orthopedics;  Laterality: Right;   TUMOR REMOVAL     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   Patient Active Problem List   Diagnosis Date Noted   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[place]) 04/17/2020   Anxiety 04/17/2020   History of GI diverticular bleed 04/17/2020   Chest pain 04/17/2020   PAD (peripheral artery disease) ([place]) 04/05/2020   Ulcer of right  leg ([place]) 04/05/2020   COPD (chronic obstructive pulmonary disease) ([place]) 04/05/2020   GERD (gastroesophageal reflux disease) 04/05/2020   Abscess of right knee 01/22/2020   Pressure injury of skin 02/28/2019   SOB (shortness of breath)    Gastrointestinal hemorrhage    Sepsis ([place])    Symptomatic anemia 02/20/2019   Trimalleolar fracture of ankle, closed, right, initial encounter 02/07/2019   Multiple lung nodules 03/31/2014   Extrinsic asthma 03/27/2014   OSA on CPAP 03/27/2014    PCP: Rusty Aus, MD  REFERRING PROVIDER: Rusty Aus, MD  REFERRING DIAG: Above knee amputation of right lower extremity  THERAPY DIAG:  S/P AKA (above knee amputation) unilateral, right (HCC)  Muscle weakness (generalized)  Gait difficulty  Abnormal posture  Rationale for Evaluation and Treatment Rehabilitation  ONSET DATE: 12/02/20  SUBJECTIVE:  EVALUATION  PERTINENT HISTORY: Patient Profile:   Jasmine Morrow is a 77 y.o. female Chief Complaint  Patient presents with  Visit Follow Up  Doing well.   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 Cardiac cath 10/2011 showed completely normal coronary arteries, however she did have mild left ventricular dysfunction with anterior wall motion abnormality. Left ventricular EF was 51%.  History of chickenpox  Hyperlipidemia  patient states she has  never been diagnosed with this  Hypertension  Knee joint replacement by other means  Macular degeneration  Major depressive disorder, recurrent, mild (CMS-HCC)  Medicare annual wellness visit, initial 08/09/2019  7/21  Multiple thyroid nodules  Normal coronary arteries 09/06/2013  Normal coronary anatomy by cardiac catheterization 10/12/11  Osteoarthritis  Ovarian cyst  PONV (postoperative nausea and vomiting)  nausea  Postoperative urinary retention 12/08/2020  Rash on lips 12/21/2020  Seasonal allergies  Sinusitis, unspecified  Sleep apnea  On CPAP.  Slow transit constipation 12/10/2020  Trimalleolar fracture of ankle, closed, right, initial encounter [id number]  Complicated by staph infection 1/21, 4 weeks IV Ancef, [facility]  Ulcer   Past Surgical History:  Procedure Laterality Date  SALPINGO OOPHORECTOMY Bilateral 1993  Right total knee arthroplasty 10/11/2006  Dr Marry Guan  Left total knee arthroplasty 10/12/2007  Dr Marry Guan  COLONOSCOPY 07/30/2012  internal hemorrhoids, diverticulosis  ANKLE ARTHODESIS W/ ARTHROSCOPY Right 02/08/2019  03/14/2019 second surgery  COLONOSCOPY 02/22/2019  Blood in entire colon/Diverticulosis - Presumed diverticular bleed. No repeat recommended per TKT.  EGD 02/22/2019  Gastritis/gastric ulcers/Hiatal hernia/Otherwise normal - no repeat recommended per TKT.  ORIF ANKLE FRACTURE Right 03/14/2019  Dr. Rudene Christians  REMOVAL HARDWARE ANKLE FOOT/TOES Right 11/12/2019  Dr. Rudene Christians  Right knee arthrotomy, irrigation and debridement of the right knee, polyethylene exchange 01/24/2020  Dr Marry Guan  Removal of hardware (plates and screws) from the right ankle with irrigation, debridement, and placement of Stimulan antibiotic beads 04/17/2020  Dr Marry Guan  Right knee arthrotomy, extensive irrigation debridement, removal of right total knee implants, and placement of antibiotic impregnated polymethylmethacrylate cement spacer 04/17/2020  Dr Marry Guan  AMPUTATION LEG ABOVE  KNEE AKA Right 12/02/2020  Procedure: AMPUTATION, THIGH, THROUGH FEMUR, ANY LEVEL; Surgeon: Lyndle Herrlich, MD; Location: [facility]; Service: Orthopedics; Laterality: Right;  TRANSFER ADJACENT TISSUE LEG Right 12/02/2020  Procedure: ADJACENT TISSUE TRANSFER OR REARRANGEMENT, LEG; DEFECT 10 SQ CM OR LESS; Surgeon: Lyndle Herrlich, MD; Location: [facility]; Service: Orthopedics; Laterality: Right;  ASPIRATION/INJECTION MAJOR JOINT/BURSA KNEE Left 12/02/2020  Procedure: ARTHROCENTESIS, ASPIRATION AND/OR INJECTION, MAJOR JOINT OR BURSA, KNEE; WITHOUT ULTRASOUND GUIDANCE; Surgeon: Lyndle Herrlich, MD; Location: [facility]; Service: Orthopedics; Laterality: Left;  REMOVAL KNEE PROSTHESIS W/POSSIBLE PLACEMENT SPACER Left 01/18/2021  Procedure: REMOVAL OF PROSTHESIS, INCLUDING TOTAL KNEE PROSTHESIS, METHYLMETHACRYLATE WITH OR WITHOUT INSERTION OF SPACER, KNEE; Surgeon: Ihor Dow, MD; Location: [facility]; Service: Orthopedics; Laterality: Left;  INSERTION NON-BIODEGRADABLE DRUG DELIVERY IMPLANT Left 01/18/2021  Procedure: INSERTION, KNEE, bioresorbable, biodegradable, NON-BIODEGRADABLE DRUG DELIVERY IMPLANT; Surgeon: Ihor Dow, MD; Location: [facility]; Service: Orthopedics; Laterality: Left;  ABOVE KNEE LEG AMPUTATION  12/02/2020  APPENDECTOMY Est 1968  CESAREAN SECTION  CHOLECYSTECTOMY  FRACTURE SURGERY 1/21  HYSTERECTOMY partial  JOINT REPLACEMENT 2008 & 2009  TUBAL LIGATION 3/79   ALLERGIES: Allergies  Allergen Reactions  Singulair [Montelukast] Other (See Comments)  Gland swelling  Sulfa (Sulfonamide Antibiotics) Swelling  Oxycodone Dizziness and Other (See Comments)  Vancomycin Analogues Other (See Comments)  Ringing in ears  Avinza [Morphine] Itching  Darvocet A500 [Propoxyphene N-Acetaminophen] Nausea  Nickel Rash  Ultracet [Tramadol-Acetaminophen] Rash  Vicodin [Hydrocodone-Acetaminophen] Vomiting  Vioxx [Rofecoxib] Other  (See Comments)  GI   CURRENT MEDICATIONS: Current Outpatient Medications: acetaminophen (TYLENOL) 500 MG tablet, Take 1,000 mg by mouth 3 (three) times a day Twice daily per patient., PRN Not Currently Taking aspirin 81 MG EC tablet, Take 1 tablet (81 mg total) by mouth at bedtime, Taking calcium carbonate-vitamin D3 (CALTRATE 600+D) 600 mg-10 mcg (400 unit) tablet, Take 0.5 tablets by mouth 2 (two) times daily, Taking cetirizine (ZYRTEC) 10 mg capsule, Take 1 capsule (10 mg total) by mouth once daily, Taking escitalopram oxalate (LEXAPRO) 10 MG tablet, Take 1 tablet (10 mg total) by mouth once daily, Taking estradioL (ESTRACE) 0.01 % (0.1 mg/gram) vaginal cream, Place 2 g vaginally twice a week, Taking folic acid/multivit,iron,miner (MULTIVIT-IRON-MIN-FOLIC ACID ORAL), Take 1 tablet by mouth once daily, Taking gabapentin (NEURONTIN) 300 MG capsule, Take 1 capsule (300 mg total) by mouth 3 (three) times daily Phantom limb pain, Taking melatonin 3 mg tablet, Take 1 tablet (3 mg total) by mouth at bedtime, Taking multivitamin with iron (COMPLETE MULTIVITAMIN-MINERAL) tablet, Take 1 tablet by mouth, Taking multivitamin with minerals, EYE, (PRESERVISION AREDS 2) soft gel capsule, Take 1 capsule by mouth 2 (two) times daily, Taking nystatin (MYCOSTATIN) 100,000 unit/gram powder, Apply small amount topically twice daily for rash under breasts, PRN Not Currently Taking pantoprazole (PROTONIX) 40 MG DR tablet, Take 1 tablet (40 mg total) by mouth 2 (two) times daily. Please call to schedule an appt. Thanks!, Taking polyethylene glycol (MIRALAX) powder, Take 17 g by mouth once daily Mix in 4-8ounces  of fluid prior to taking., Taking sennosides-docusate (SENOKOT-S) 8.6-50 mg tablet, Take 1 tablet by mouth 2 (two) times daily For constipation, Taking lactose-reduced food (ENSURE PLUS ORAL), Take 237 mLs by mouth 2 (two) times daily With breakfast and dinner  HPI   CLINICAL SUMMARY:  Patient post right  AKA and working with prosthesis. 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 Able to do housework, improving  PAIN:  Are you having pain? Yes: NPRS scale: 5/10 Pain location: L knee Pain description: aching Aggravating factors: walking Relieving factors: rest  PRECAUTIONS: None  WEIGHT BEARING RESTRICTIONS No  FALLS:  Has patient fallen in last 6 months? No  LIVING ENVIRONMENT: Lives with: lives with their spouse Lives in: House/apartment Stairs: No Has following equipment at home: Gilford Rile - 4 wheeled and Wheelchair (manual)  OCCUPATION: retired  PLOF: Requires assistive device for independence  PATIENT GOALS  improve standing tolerance/ pull up pants/ ambulate with [place] safely.     OBJECTIVE:   PATIENT SURVEYS:  FOTO initial 46/ goal 68.   11/2: 50  COGNITION:  Overall cognitive status: Within functional limits for tasks assessed     SENSATION: WFL  POSTURE: rounded shoulders, forward head, flexed trunk , and weight shift left  PALPATION: No tenderness along R distal residual limb.  Discussed phantom limb sensation.    LOWER EXTREMITY ROM:  B LE AROM WFL except L knee extension secondary to articulating spacer.  Did not assess L/R hip extension at this time.    LOWER EXTREMITY MMT:  MMT Right eval Left eval  Hip flexion 4/5 4/5  Hip extension    Hip abduction 4+/5 4+/5  Hip adduction 4+/5 4+/5  Hip internal rotation    Hip external rotation    Knee flexion N/A 4+/5  Knee extension N/A 4+/5  Ankle dorsiflexion  5/5  Ankle plantarflexion    Ankle inversion    Ankle eversion     (Blank rows = not tested)  GAIT: Distance walked: in gym/ //-bars Assistive device utilized: Environmental consultant - 2 wheeled Level of assistance: CGA Comments: Cuing for posture correction with mirror feedback.  Flexed posture/ heavy UE assist.     TODAY'S TREATMENT:  04/26/2022  Subjective:  Pt. Reports no new complaints.  Pt. Arrived in w/c and ready to  work on gait/ standing balance.   Pts. Husband scheduled for TKA on Thursday so pt. Will miss PT and return next Tuesday (05/03/22).       Objective:   There.ex.:  No charge   Nustep L5 10 min. L UE/B LE warm-up prior to gait.  Improvement in prosthetic fit after Nustep.                Gait training:    Amb. From blue mat to Nustep with use of RW and focus on step through gait. and heel strike during and after the gait.  Cuing to increase R LE step length while maintaining consistent BOS/ knee mechanism extension/ heel strike.       Walking in hallway with focus on upright posture/ R step length to ensure heel strike/ full knee extension prior to wt. Bearing.  No episodes of R knee mechanism buckling.  Pt. More confident with gait pattern as tx. Progresses.  1st: 3 min. 22 sec.  2nd: 2 min. 48 sec.  3rd: 3 min. 17 sec.  Pt. Requires seated rest breaks due to B shoulder/ UE muscle fatigue/ discomfort.      PATIENT  EDUCATION:  Education details: HEP/ gait and prosthetic training.  Person educated: Patient and Spouse Education method: Explanation, Demonstration, and Verbal cues Education comprehension: verbalized understanding, returned demonstration, and tactile cues required   HOME EXERCISE PROGRAM: Prone position hip stretches/ Standing marching at kitchen counter with w/c behind patient.      ASSESSMENT:  CLINICAL IMPRESSION: Pt. Presents to PT in w/c and brought prosthetic leg.  Pt. Highly motivated to become more independent with use of RW while walking short distances in gym.  B UE/ shoulder fatigue with heavy UE assist with use of RW.  Pt. Limited to walking <100 feet at a time due to B shoulder/ UE fatigue.  No episodes of R knee mechanism buckling but pt. Careful with initial aspects of walking during tx.  Pt. Will benefit from skilled PT services to improve standing tolerance/ gait independence and ADL.    OBJECTIVE IMPAIRMENTS Abnormal gait, decreased activity tolerance,  decreased balance, decreased endurance, decreased mobility, difficulty walking, decreased ROM, decreased strength, decreased safety awareness, impaired flexibility, improper body mechanics, prosthetic dependency , and pain.   ACTIVITY LIMITATIONS carrying, lifting, standing, squatting, stairs, transfers, bed mobility, bathing, toileting, dressing, hygiene/grooming, and locomotion level  PARTICIPATION LIMITATIONS: cleaning, laundry, driving, shopping, community activity, and yard work  [place] Past/current experiences are also affecting patient's functional outcome.   REHAB POTENTIAL: Good  CLINICAL DECISION MAKING: Evolving/moderate complexity  EVALUATION COMPLEXITY: High   GOALS: Goals reviewed with patient Yes  LONG TERM GOALS: Target date: 05/24/22  Pt. Will increase FOTO to 54 to improve functional mobility.  Baseline: initial FOTO 46.  11/2: 50 Goal status: Partially met  2.  Pt. Able to tolerate standing 10 minutes while pulling up pants with mod. I safely while wearing prosthetic leg to improve daily activities.  Baseline:  limited standing tolerance Goal status: Not met  3.  Pt. Will ambulate 200 feet with use of SPC and mod. I to promote safety with household tasks/ walking into bathroom.   Baseline:  amb. With RW and min. A for safety.  2/27: walking in clinic with SBA/mod. I for 100 feet with RW and use of prosthetic leg  Goal status: Not met  4.  Pt. Will ascend 10 stairs with use of single forearm crutch/ handrail and step to pattern with mod. I safely.  Baseline:  see above Goal status: Initial    PLAN: PT FREQUENCY: 2x/week  PT DURATION: 8 weeks  PLANNED INTERVENTIONS: Therapeutic exercises, Therapeutic activity, Neuromuscular re-education, Balance training, Gait training, Patient/Family education, Self Care, Joint mobilization, Prosthetic training, DME instructions, and Manual therapy  PLAN FOR NEXT SESSION:  Progress gait/ standing tolerance.    Pura Spice, PT, DPT # [phone] 8:22 PM,04/26/22

## 2022-04-28 ENCOUNTER — Encounter: Payer: Medicare Other | Admitting: Physical Therapy

## 2022-05-03 ENCOUNTER — Ambulatory Visit: Payer: Medicare Other | Admitting: Physical Therapy

## 2022-05-05 ENCOUNTER — Encounter: Payer: Self-pay | Admitting: Physical Therapy

## 2022-05-05 ENCOUNTER — Ambulatory Visit: Payer: Medicare Other | Attending: Internal Medicine | Admitting: Physical Therapy

## 2022-05-05 DIAGNOSIS — R293 Abnormal posture: Secondary | ICD-10-CM | POA: Diagnosis present

## 2022-05-05 DIAGNOSIS — R269 Unspecified abnormalities of gait and mobility: Secondary | ICD-10-CM | POA: Diagnosis present

## 2022-05-05 DIAGNOSIS — M6281 Muscle weakness (generalized): Secondary | ICD-10-CM | POA: Insufficient documentation

## 2022-05-05 DIAGNOSIS — Z89611 Acquired absence of right leg above knee: Secondary | ICD-10-CM | POA: Diagnosis present

## 2022-05-05 NOTE — Therapy (Signed)
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   Hypertension    PONV (postoperative nausea and vomiting)    Seasonal allergies    Sleep apnea    does not uses CPAP machine anymore    Wheelchair dependent    able to self transfer   Past Surgical History:  Procedure Laterality Date   APPENDECTOMY     CATARACT EXTRACTION W/PHACO Right 02/28/2022   Procedure: CATARACT EXTRACTION PHACO AND INTRAOCULAR LENS PLACEMENT (I[facility]) RIGHT  4.82  00:30.9;  Surgeon: Eulogio Bear, MD;  Location: [place];  Service: Ophthalmology;  Laterality: Right;  sleep apnea   CATARACT EXTRACTION W/PHACO Left 03/14/2022   Procedure: CATARACT EXTRACTION PHACO AND INTRAOCULAR LENS PLACEMENT ([place]) LEFT;  Surgeon: Eulogio Bear, MD;  Location: [place];  Service: Ophthalmology;  Laterality: Left;  4.32 0:39.7   CESAREAN SECTION     CHOLECYSTECTOMY     DILATION AND CURETTAGE OF UTERUS     ESOPHAGOGASTRODUODENOSCOPY N/A 02/22/2019   Procedure: ESOPHAGOGASTRODUODENOSCOPY (EGD);  Surgeon: Toledo, Benay Pike, MD;  Location: [facility];  Service: Gastroenterology;  Laterality: N/A;   FLEXIBLE SIGMOIDOSCOPY N/A 02/22/2019   Procedure: FLEXIBLE SIGMOIDOSCOPY;  Surgeon: Toledo, Benay Pike, MD;  Location: [facility];  Service: Gastroenterology;  Laterality: N/A;   HARDWARE REMOVAL Right 11/12/2019   Procedure: Right ankle hardware removal;  Surgeon: Hessie Knows, MD;  Location: [facility];   Service: Orthopedics;  Laterality: Right;   HARDWARE REMOVAL Right 04/17/2020   Procedure: HARDWARE REMOVAL FROM RIGHT ANKLE AND RIGHT KNEE; IMPLANT OF CEMENT SPACERS IN RIGHT KNEE;  Surgeon: Dereck Leep, MD;  Location: [facility];  Service: Orthopedics;  Laterality: Right;   JOINT REPLACEMENT     LEG AMPUTATION ABOVE KNEE Right 12/02/2020   [facility]   ORIF ANKLE FRACTURE Right 03/14/2019   Procedure: OPEN REDUCTION INTERNAL FIXATION (ORIF) ANKLE FRACTURE, MEDIAL MALLEOLUS;  Surgeon: Hessie Knows, MD;  Location: [facility];  Service: Orthopedics;  Laterality: Right;   ORIF ANKLE FRACTURE Right 02/08/2019   Procedure: OPEN REDUCTION INTERNAL FIXATION (ORIF) ANKLE FRACTURE, post malleolus;  Surgeon: Hessie Knows, MD;  Location: [facility];  Service: Orthopedics;  Laterality: Right;   PARTIAL HYSTERECTOMY  1993   REPLACEMENT TOTAL KNEE Bilateral 2008   2009   SCAR DEBRIDEMENT OF TOTAL KNEE  01/2020   SYNDESMOSIS REPAIR Right 02/08/2019   Procedure: SYNDESMOSIS REPAIR;  Surgeon: Hessie Knows, MD;  Location: [facility];  Service: Orthopedics;  Laterality: Right;   TEE WITHOUT CARDIOVERSION N/A 02/26/2019   Procedure: TRANSESOPHAGEAL ECHOCARDIOGRAM (TEE);  Surgeon: Corey Skains, MD;  Location: [facility];  Service: Cardiovascular;  Laterality: N/A;   TOTAL KNEE REVISION WITH SCAR DEBRIDEMENT/PATELLA REVISION WITH POLY EXCHANGE Right 01/24/2020   Procedure: TOTAL KNEE REVISION WITH SCAR DEBRIDEMENT/PATELLA REVISION WITH POLY EXCHANGE;  Surgeon: Dereck Leep,  MD;  Location: [facility];  Service: Orthopedics;  Laterality: Right;   TUMOR REMOVAL     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   Patient Active Problem List   Diagnosis Date Noted   Chronic infection of prosthetic knee 04/17/2020   Anxiety 04/17/2020   History of GI diverticular bleed 04/17/2020   Chest pain 04/17/2020   PAD (peripheral artery disease) 04/05/2020   Ulcer of right leg 04/05/2020   COPD (chronic obstructive pulmonary disease)  04/05/2020   GERD (gastroesophageal reflux disease) 04/05/2020   Abscess of right knee 01/22/2020   Pressure injury of skin 02/28/2019   SOB (shortness of breath)    Gastrointestinal hemorrhage    Sepsis    Symptomatic anemia 02/20/2019   Trimalleolar fracture of ankle, closed, right, initial encounter 02/07/2019   Multiple lung nodules 03/31/2014   Extrinsic asthma 03/27/2014   OSA on CPAP 03/27/2014    PCP: Rusty Aus, MD  REFERRING PROVIDER: Rusty Aus, MD  REFERRING DIAG: Above knee amputation of right lower extremity  THERAPY DIAG:  S/P AKA (above knee amputation) unilateral, right  Muscle weakness (generalized)  Gait difficulty  Abnormal posture  Rationale for Evaluation and Treatment Rehabilitation  ONSET DATE: 12/02/20  SUBJECTIVE:  EVALUATION  PERTINENT HISTORY: Patient Profile:   Jasmine Morrow is a 77 y.o. female Chief Complaint  Patient presents with  Visit Follow Up  Doing well.   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 Cardiac cath 10/2011 showed completely normal coronary arteries, however she did have mild left ventricular dysfunction with anterior wall motion abnormality. Left ventricular EF was 51%.  History of chickenpox  Hyperlipidemia  patient states she has never been diagnosed with this  Hypertension  Knee joint replacement by other means   Macular degeneration  Major depressive disorder, recurrent, mild (CMS-HCC)  Medicare annual wellness visit, initial 08/09/2019  7/21  Multiple thyroid nodules  Normal coronary arteries 09/06/2013  Normal coronary anatomy by cardiac catheterization 10/12/11  Osteoarthritis  Ovarian cyst  PONV (postoperative nausea and vomiting)  nausea  Postoperative urinary retention 12/08/2020  Rash on lips 12/21/2020  Seasonal allergies  Sinusitis, unspecified  Sleep apnea  On CPAP.  Slow transit constipation 12/10/2020  Trimalleolar fracture of ankle, closed, right, initial encounter [id number]  Complicated by staph infection 1/21, 4 weeks IV Ancef, [facility]  Ulcer   Past Surgical History:  Procedure Laterality Date  SALPINGO OOPHORECTOMY Bilateral 1993  Right total knee arthroplasty 10/11/2006  Dr Marry Guan  Left total knee arthroplasty 10/12/2007  Dr Marry Guan  COLONOSCOPY 07/30/2012  internal hemorrhoids, diverticulosis  ANKLE ARTHODESIS W/ ARTHROSCOPY Right 02/08/2019  03/14/2019 second surgery  COLONOSCOPY 02/22/2019  Blood in entire colon/Diverticulosis - Presumed diverticular bleed. No repeat recommended per TKT.  EGD 02/22/2019  Gastritis/gastric ulcers/Hiatal hernia/Otherwise normal - no repeat recommended per TKT.  ORIF ANKLE FRACTURE Right 03/14/2019  Dr. Rudene Christians  REMOVAL HARDWARE ANKLE FOOT/TOES Right 11/12/2019  Dr. Rudene Christians  Right knee arthrotomy, irrigation and debridement  of the right knee, polyethylene exchange 01/24/2020  Dr Marry Guan  Removal of hardware (plates and screws) from the right ankle with irrigation, debridement, and placement of Stimulan antibiotic beads 04/17/2020  Dr Marry Guan  Right knee arthrotomy, extensive irrigation debridement, removal of right total knee implants, and placement of antibiotic impregnated polymethylmethacrylate cement spacer 04/17/2020  Dr Marry Guan  AMPUTATION LEG ABOVE KNEE AKA Right 12/02/2020  Procedure: AMPUTATION, THIGH, THROUGH FEMUR, ANY LEVEL;  Surgeon: Lyndle Herrlich, MD; Location: [place]; Service: Orthopedics; Laterality: Right;  TRANSFER ADJACENT TISSUE LEG Right 12/02/2020  Procedure: ADJACENT TISSUE TRANSFER OR REARRANGEMENT, LEG; DEFECT 10 SQ CM OR LESS; Surgeon: Lyndle Herrlich, MD; Location: [place]; Service: Orthopedics; Laterality: Right;  ASPIRATION/INJECTION MAJOR JOINT/BURSA KNEE Left 12/02/2020  Procedure: ARTHROCENTESIS, ASPIRATION AND/OR INJECTION, MAJOR JOINT OR BURSA, KNEE; WITHOUT ULTRASOUND GUIDANCE; Surgeon: Lyndle Herrlich, MD; Location: [place]; Service: Orthopedics; Laterality: Left;  REMOVAL KNEE PROSTHESIS W/POSSIBLE PLACEMENT SPACER Left 01/18/2021  Procedure: REMOVAL OF PROSTHESIS, INCLUDING TOTAL KNEE PROSTHESIS, METHYLMETHACRYLATE WITH OR WITHOUT INSERTION OF SPACER, KNEE; Surgeon: Ihor Dow, MD; Location: [place]; Service: Orthopedics; Laterality: Left;  INSERTION NON-BIODEGRADABLE DRUG DELIVERY IMPLANT Left 01/18/2021  Procedure: INSERTION, KNEE, bioresorbable, biodegradable, NON-BIODEGRADABLE DRUG DELIVERY IMPLANT; Surgeon: Ihor Dow, MD; Location: [place]; Service: Orthopedics; Laterality: Left;  ABOVE KNEE LEG AMPUTATION  12/02/2020  APPENDECTOMY Est 1968  CESAREAN SECTION  CHOLECYSTECTOMY  FRACTURE SURGERY 1/21  HYSTERECTOMY partial  JOINT REPLACEMENT 2008 & 2009  TUBAL LIGATION 3/79   ALLERGIES: Allergies  Allergen Reactions  Singulair [Montelukast] Other (See Comments)  Gland swelling  Sulfa (Sulfonamide Antibiotics) Swelling  Oxycodone Dizziness and Other (See Comments)  Vancomycin Analogues Other (See Comments)  Ringing in ears  Avinza [Morphine] Itching  Darvocet A500 [Propoxyphene N-Acetaminophen] Nausea  Nickel Rash  Ultracet [Tramadol-Acetaminophen] Rash  Vicodin [Hydrocodone-Acetaminophen] Vomiting  Vioxx [Rofecoxib] Other (See Comments)  GI   CURRENT MEDICATIONS: Current Outpatient Medications:  acetaminophen (TYLENOL) 500 MG tablet, Take 1,000 mg by mouth 3 (three) times a day Twice daily per patient., PRN Not Currently Taking aspirin 81 MG EC tablet, Take 1 tablet (81 mg total) by mouth at bedtime, Taking calcium carbonate-vitamin D3 (CALTRATE 600+D) 600 mg-10 mcg (400 unit) tablet, Take 0.5 tablets by mouth 2 (two) times daily, Taking cetirizine (ZYRTEC) 10 mg capsule, Take 1 capsule (10 mg total) by mouth once daily, Taking escitalopram oxalate (LEXAPRO) 10 MG tablet, Take 1 tablet (10 mg total) by mouth once daily, Taking estradioL (ESTRACE) 0.01 % (0.1 mg/gram) vaginal cream, Place 2 g vaginally twice a week, Taking folic acid/multivit,iron,miner (MULTIVIT-IRON-MIN-FOLIC ACID ORAL), Take 1 tablet by mouth once daily, Taking gabapentin (NEURONTIN) 300 MG capsule, Take 1 capsule (300 mg total) by mouth 3 (three) times daily Phantom limb pain, Taking melatonin 3 mg tablet, Take 1 tablet (3 mg total) by mouth at bedtime, Taking multivitamin with iron (COMPLETE MULTIVITAMIN-MINERAL) tablet, Take 1 tablet by mouth, Taking multivitamin with minerals, EYE, (PRESERVISION AREDS 2) soft gel capsule, Take 1 capsule by mouth 2 (two) times daily, Taking nystatin (MYCOSTATIN) 100,000 unit/gram powder, Apply small amount topically twice daily for rash under breasts, PRN Not Currently Taking pantoprazole (PROTONIX) 40 MG DR tablet, Take 1 tablet (40 mg total) by mouth 2 (two) times daily. Please call to schedule an appt. Thanks!, Taking polyethylene glycol (MIRALAX) powder, Take 17 g by mouth once daily Mix in 4-8ounces of fluid prior to taking., Taking sennosides-docusate (SENOKOT-S) 8.6-50 mg tablet, Take 1 tablet by mouth  2 (two) times daily For constipation, Taking lactose-reduced food (ENSURE PLUS ORAL), Take 237 mLs by mouth 2 (two) times daily With breakfast and dinner  HPI   CLINICAL SUMMARY:  Patient post right AKA and working with prosthesis. 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 Able to do housework, improving  PAIN:  Are you having pain? Yes: NPRS scale: 5/10 Pain location: L knee Pain description: aching Aggravating factors: walking Relieving factors: rest  PRECAUTIONS: None  WEIGHT BEARING RESTRICTIONS No  FALLS:  Has patient fallen in last 6 months? No  LIVING ENVIRONMENT: Lives with: lives with their spouse Lives in: House/apartment Stairs: No Has following equipment at home: Gilford Rile - 4 wheeled and Wheelchair (manual)  OCCUPATION: retired  PLOF: Requires assistive device for independence  PATIENT GOALS  improve standing tolerance/ pull up pants/ ambulate with [place] safely.     OBJECTIVE:   PATIENT SURVEYS:  FOTO initial 46/ goal 21.   11/2: 50  COGNITION:  Overall cognitive status: Within functional limits for tasks assessed     SENSATION: WFL  POSTURE: rounded shoulders, forward head, flexed trunk , and weight shift left  PALPATION: No tenderness along R distal residual limb.  Discussed phantom limb sensation.    LOWER EXTREMITY ROM:  B LE AROM WFL except L knee extension secondary to articulating spacer.  Did not assess L/R hip extension at this time.    LOWER EXTREMITY MMT:  MMT Right eval Left eval  Hip flexion 4/5 4/5  Hip extension    Hip abduction 4+/5 4+/5  Hip adduction 4+/5 4+/5  Hip internal rotation    Hip external rotation    Knee flexion N/A 4+/5  Knee extension N/A 4+/5  Ankle dorsiflexion  5/5  Ankle plantarflexion    Ankle inversion    Ankle eversion     (Blank rows = not tested)  GAIT: Distance walked: in gym/ //-bars Assistive device utilized: Environmental consultant - 2 wheeled Level of assistance: CGA Comments: Cuing for posture correction with mirror feedback.  Flexed posture/ heavy UE assist.     TODAY'S TREATMENT:  05/05/2022  Subjective:  Pt. Has been assisting husband at home over past week after husband's TKA on 3/28.  Pt. Arrived in w/c and ready to work on gait/ standing  balance.  Pt. Has been wearing prosthetic leg at home but has not been walking.       Objective:   There.ex.:  No charge   Nustep L5 10 min. L UE/B LE warm-up prior to gait.  Improvement in prosthetic fit after Nustep.                Gait training:    Amb. From blue mat to Nustep with use of RW and focus on step through gait. and heel strike during and after the gait.  Cuing to increase R LE step length while maintaining consistent BOS/ knee mechanism extension/ heel strike.  Mod. A with R prosthetic leg today secondary to difficulty with knee mechanism extension during stance phase of gait.  Manual PT assist with R knee to ensure safety/ prevent falling.        Walking at agility ladder with focus on upright posture/ R step length to ensure heel strike/ full knee extension prior to wt. Bearing.  No episodes of R knee mechanism buckling.  Pt. More confident with gait pattern as tx. Progresses.  Pt. Able to ambulate 40 feet x 3 with improvement in knee control/ stance  phase of gait.  Pt. Requires use of w/c to get from PT gym to car today.  L knee discomfort.     PATIENT EDUCATION:  Education details: HEP/ gait and prosthetic training.  Person educated: Patient and Spouse Education method: Explanation, Demonstration, and Verbal cues Education comprehension: verbalized understanding, returned demonstration, and tactile cues required   HOME EXERCISE PROGRAM: Prone position hip stretches/ Standing marching at kitchen counter with w/c behind patient.      ASSESSMENT:  CLINICAL IMPRESSION: Pt. Presents to PT in w/c and brought prosthetic leg.  Pt. Highly motivated to become more independent with use of RW while walking short distances in gym.  B UE/ shoulder fatigue with heavy UE assist with use of RW.  Pt. Limited to walking <100 feet at a time due to B shoulder/ UE fatigue and L knee pain.  Several episodes of R knee mechanism buckling with initial walking requiring mod. PT assist for safety.   Pt. Will benefit from skilled PT services to improve standing tolerance/ gait independence and ADL.    OBJECTIVE IMPAIRMENTS Abnormal gait, decreased activity tolerance, decreased balance, decreased endurance, decreased mobility, difficulty walking, decreased ROM, decreased strength, decreased safety awareness, impaired flexibility, improper body mechanics, prosthetic dependency , and pain.   ACTIVITY LIMITATIONS carrying, lifting, standing, squatting, stairs, transfers, bed mobility, bathing, toileting, dressing, hygiene/grooming, and locomotion level  PARTICIPATION LIMITATIONS: cleaning, laundry, driving, shopping, community activity, and yard work  [place] Past/current experiences are also affecting patient's functional outcome.   REHAB POTENTIAL: Good  CLINICAL DECISION MAKING: Evolving/moderate complexity  EVALUATION COMPLEXITY: High   GOALS: Goals reviewed with patient Yes  LONG TERM GOALS: Target date: 05/24/22  Pt. Will increase FOTO to 54 to improve functional mobility.  Baseline: initial FOTO 46.  11/2: 50 Goal status: Partially met  2.  Pt. Able to tolerate standing 10 minutes while pulling up pants with mod. I safely while wearing prosthetic leg to improve daily activities.  Baseline:  limited standing tolerance Goal status: Not met  3.  Pt. Will ambulate 200 feet with use of SPC and mod. I to promote safety with household tasks/ walking into bathroom.   Baseline:  amb. With RW and min. A for safety.  2/27: walking in clinic with SBA/mod. I for 100 feet with RW and use of prosthetic leg  Goal status: Not met  4.  Pt. Will ascend 10 stairs with use of single forearm crutch/ handrail and step to pattern with mod. I safely.  Baseline:  see above Goal status: Initial    PLAN: PT FREQUENCY: 2x/week  PT DURATION: 8 weeks  PLANNED INTERVENTIONS: Therapeutic exercises, Therapeutic activity, Neuromuscular re-education, Balance training, Gait training,  Patient/Family education, Self Care, Joint mobilization, Prosthetic training, DME instructions, and Manual therapy  PLAN FOR NEXT SESSION:  Progress gait/ standing tolerance.   Pura Spice, PT, DPT # [phone] 12:49 PM,05/05/22

## 2022-05-10 ENCOUNTER — Ambulatory Visit: Payer: Medicare Other | Admitting: Physical Therapy

## 2022-05-10 DIAGNOSIS — M6281 Muscle weakness (generalized): Secondary | ICD-10-CM

## 2022-05-10 DIAGNOSIS — R269 Unspecified abnormalities of gait and mobility: Secondary | ICD-10-CM

## 2022-05-10 DIAGNOSIS — R293 Abnormal posture: Secondary | ICD-10-CM

## 2022-05-10 DIAGNOSIS — Z89611 Acquired absence of right leg above knee: Secondary | ICD-10-CM | POA: Diagnosis not present

## 2022-05-10 NOTE — Therapy (Signed)
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   Hypertension    PONV (postoperative nausea and vomiting)    Seasonal allergies    Sleep apnea    does not uses CPAP machine anymore    Wheelchair dependent    able to self transfer   Past Surgical History:  Procedure Laterality Date   APPENDECTOMY     CATARACT EXTRACTION W/PHACO Right 02/28/2022   Procedure: CATARACT EXTRACTION PHACO AND INTRAOCULAR LENS PLACEMENT (IOC) RIGHT  4.82  00:30.9;  Surgeon: Nevada Crane, MD;  Location: [facility];  Service: Ophthalmology;  Laterality: Right;  sleep apnea   CATARACT EXTRACTION W/PHACO Left 03/14/2022   Procedure: CATARACT EXTRACTION PHACO AND INTRAOCULAR LENS PLACEMENT (IOC) LEFT;  Surgeon: Nevada Crane, MD;  Location: [facility];  Service: Ophthalmology;  Laterality: Left;  4.32 0:39.7   CESAREAN SECTION     CHOLECYSTECTOMY     DILATION AND CURETTAGE OF UTERUS     ESOPHAGOGASTRODUODENOSCOPY N/A 02/22/2019   Procedure: ESOPHAGOGASTRODUODENOSCOPY (EGD);  Surgeon: Toledo, Boykin Nearing, MD;  Location: [facility];  Service: Gastroenterology;  Laterality: N/A;   FLEXIBLE SIGMOIDOSCOPY N/A 02/22/2019   Procedure: FLEXIBLE SIGMOIDOSCOPY;  Surgeon: Toledo, Boykin Nearing, MD;  Location: [facility];  Service: Gastroenterology;  Laterality: N/A;   HARDWARE REMOVAL Right 11/12/2019   Procedure: Right ankle hardware removal;  Surgeon: Kennedy Bucker, MD;  Location: [facility];   Service: Orthopedics;  Laterality: Right;   HARDWARE REMOVAL Right 04/17/2020   Procedure: HARDWARE REMOVAL FROM RIGHT ANKLE AND RIGHT KNEE; IMPLANT OF CEMENT SPACERS IN RIGHT KNEE;  Surgeon: Donato Heinz, MD;  Location: [facility];  Service: Orthopedics;  Laterality: Right;   JOINT REPLACEMENT     LEG AMPUTATION ABOVE KNEE Right 12/02/2020   [facility]   ORIF ANKLE FRACTURE Right 03/14/2019   Procedure: OPEN REDUCTION INTERNAL FIXATION (ORIF) ANKLE FRACTURE, MEDIAL MALLEOLUS;  Surgeon: Kennedy Bucker, MD;  Location: [facility];  Service: Orthopedics;  Laterality: Right;   ORIF ANKLE FRACTURE Right 02/08/2019   Procedure: OPEN REDUCTION INTERNAL FIXATION (ORIF) ANKLE FRACTURE, post malleolus;  Surgeon: Kennedy Bucker, MD;  Location: [facility];  Service: Orthopedics;  Laterality: Right;   PARTIAL HYSTERECTOMY  1993   REPLACEMENT TOTAL KNEE Bilateral 2008   2009   SCAR DEBRIDEMENT OF TOTAL KNEE  01/2020   SYNDESMOSIS REPAIR Right 02/08/2019   Procedure: SYNDESMOSIS REPAIR;  Surgeon: Kennedy Bucker, MD;  Location: [facility];  Service: Orthopedics;  Laterality: Right;   TEE WITHOUT CARDIOVERSION N/A 02/26/2019   Procedure: TRANSESOPHAGEAL ECHOCARDIOGRAM (TEE);  Surgeon: Lamar Blinks, MD;  Location: [facility];  Service: Cardiovascular;  Laterality: N/A;   TOTAL KNEE REVISION WITH SCAR DEBRIDEMENT/PATELLA REVISION WITH POLY EXCHANGE Right 01/24/2020   Procedure: TOTAL KNEE REVISION WITH SCAR DEBRIDEMENT/PATELLA REVISION WITH POLY EXCHANGE;  Surgeon: Donato Heinz,  MD;  Location: [facility];  Service: Orthopedics;  Laterality: Right;   TUMOR REMOVAL     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   Patient Active Problem List   Diagnosis Date Noted   Chronic infection of prosthetic knee 04/17/2020   Anxiety 04/17/2020   History of GI diverticular bleed 04/17/2020   Chest pain 04/17/2020   PAD (peripheral artery disease) 04/05/2020   Ulcer of right leg 04/05/2020   COPD (chronic obstructive pulmonary disease)  04/05/2020   GERD (gastroesophageal reflux disease) 04/05/2020   Abscess of right knee 01/22/2020   Pressure injury of skin 02/28/2019   SOB (shortness of breath)    Gastrointestinal hemorrhage    Sepsis    Symptomatic anemia 02/20/2019   Trimalleolar fracture of ankle, closed, right, initial encounter 02/07/2019   Multiple lung nodules 03/31/2014   Extrinsic asthma 03/27/2014   OSA on CPAP 03/27/2014    PCP: Rusty Aus, MD  REFERRING PROVIDER: Rusty Aus, MD  REFERRING DIAG: Above knee amputation of right lower extremity  THERAPY DIAG:  S/P AKA (above knee amputation) unilateral, right  Muscle weakness (generalized)  Gait difficulty  Abnormal posture  Rationale for Evaluation and Treatment Rehabilitation  ONSET DATE: 12/02/20  SUBJECTIVE:  EVALUATION  PERTINENT HISTORY: Patient Profile:   Lanea Spruiell is a 77 y.o. female Chief Complaint  Patient presents with  Visit Follow Up  Doing well.   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 Cardiac cath 10/2011 showed completely normal coronary arteries, however she did have mild left ventricular dysfunction with anterior wall motion abnormality. Left ventricular EF was 51%.  History of chickenpox  Hyperlipidemia  patient states she has never been diagnosed with this  Hypertension  Knee joint replacement by other means   Macular degeneration  Major depressive disorder, recurrent, mild (CMS-HCC)  Medicare annual wellness visit, initial 08/09/2019  7/21  Multiple thyroid nodules  Normal coronary arteries 09/06/2013  Normal coronary anatomy by cardiac catheterization 10/12/11  Osteoarthritis  Ovarian cyst  PONV (postoperative nausea and vomiting)  nausea  Postoperative urinary retention 12/08/2020  Rash on lips 12/21/2020  Seasonal allergies  Sinusitis, unspecified  Sleep apnea  On CPAP.  Slow transit constipation 12/10/2020  Trimalleolar fracture of ankle, closed, right, initial encounter [id number]  Complicated by staph infection 1/21, 4 weeks IV Ancef, Menz  Ulcer   Past Surgical History:  Procedure Laterality Date  SALPINGO OOPHORECTOMY Bilateral 1993  Right total knee arthroplasty 10/11/2006  Dr Marry Guan  Left total knee arthroplasty 10/12/2007  Dr Marry Guan  COLONOSCOPY 07/30/2012  internal hemorrhoids, diverticulosis  ANKLE ARTHODESIS W/ ARTHROSCOPY Right 02/08/2019  03/14/2019 second surgery  COLONOSCOPY 02/22/2019  Blood in entire colon/Diverticulosis - Presumed diverticular bleed. No repeat recommended per TKT.  EGD 02/22/2019  Gastritis/gastric ulcers/Hiatal hernia/Otherwise normal - no repeat recommended per TKT.  ORIF ANKLE FRACTURE Right 03/14/2019  Dr. Rudene Christians  REMOVAL HARDWARE ANKLE FOOT/TOES Right 11/12/2019  Dr. Rudene Christians  Right knee arthrotomy, irrigation and debridement  of the right knee, polyethylene exchange 01/24/2020  Dr Marry Guan  Removal of hardware (plates and screws) from the right ankle with irrigation, debridement, and placement of Stimulan antibiotic beads 04/17/2020  Dr Marry Guan  Right knee arthrotomy, extensive irrigation debridement, removal of right total knee implants, and placement of antibiotic impregnated polymethylmethacrylate cement spacer 04/17/2020  Dr Marry Guan  AMPUTATION LEG ABOVE KNEE AKA Right 12/02/2020  Procedure: AMPUTATION, THIGH, THROUGH FEMUR, ANY LEVEL;  Surgeon: Lyndle Herrlich, MD; Location: [place]; Service: Orthopedics; Laterality: Right;  TRANSFER ADJACENT TISSUE LEG Right 12/02/2020  Procedure: ADJACENT TISSUE TRANSFER OR REARRANGEMENT, LEG; DEFECT 10 SQ CM OR LESS; Surgeon: Lyndle Herrlich, MD; Location: [place]; Service: Orthopedics; Laterality: Right;  ASPIRATION/INJECTION MAJOR JOINT/BURSA KNEE Left 12/02/2020  Procedure: ARTHROCENTESIS, ASPIRATION AND/OR INJECTION, MAJOR JOINT OR BURSA, KNEE; WITHOUT ULTRASOUND GUIDANCE; Surgeon: Lyndle Herrlich, MD; Location: [place]; Service: Orthopedics; Laterality: Left;  REMOVAL KNEE PROSTHESIS W/POSSIBLE PLACEMENT SPACER Left 01/18/2021  Procedure: REMOVAL OF PROSTHESIS, INCLUDING TOTAL KNEE PROSTHESIS, METHYLMETHACRYLATE WITH OR WITHOUT INSERTION OF SPACER, KNEE; Surgeon: Ihor Dow, MD; Location: [place]; Service: Orthopedics; Laterality: Left;  INSERTION NON-BIODEGRADABLE DRUG DELIVERY IMPLANT Left 01/18/2021  Procedure: INSERTION, KNEE, bioresorbable, biodegradable, NON-BIODEGRADABLE DRUG DELIVERY IMPLANT; Surgeon: Ihor Dow, MD; Location: [place]; Service: Orthopedics; Laterality: Left;  ABOVE KNEE LEG AMPUTATION  12/02/2020  APPENDECTOMY Est 1968  CESAREAN SECTION  CHOLECYSTECTOMY  FRACTURE SURGERY 1/21  HYSTERECTOMY partial  JOINT REPLACEMENT 2008 & 2009  TUBAL LIGATION 3/79   ALLERGIES: Allergies  Allergen Reactions  Singulair [Montelukast] Other (See Comments)  Gland swelling  Sulfa (Sulfonamide Antibiotics) Swelling  Oxycodone Dizziness and Other (See Comments)  Vancomycin Analogues Other (See Comments)  Ringing in ears  Avinza [Morphine] Itching  Darvocet A500 [Propoxyphene N-Acetaminophen] Nausea  Nickel Rash  Ultracet [Tramadol-Acetaminophen] Rash  Vicodin [Hydrocodone-Acetaminophen] Vomiting  Vioxx [Rofecoxib] Other (See Comments)  GI   CURRENT MEDICATIONS: Current Outpatient Medications:  acetaminophen (TYLENOL) 500 MG tablet, Take 1,000 mg by mouth 3 (three) times a day Twice daily per patient., PRN Not Currently Taking aspirin 81 MG EC tablet, Take 1 tablet (81 mg total) by mouth at bedtime, Taking calcium carbonate-vitamin D3 (CALTRATE 600+D) 600 mg-10 mcg (400 unit) tablet, Take 0.5 tablets by mouth 2 (two) times daily, Taking cetirizine (ZYRTEC) 10 mg capsule, Take 1 capsule (10 mg total) by mouth once daily, Taking escitalopram oxalate (LEXAPRO) 10 MG tablet, Take 1 tablet (10 mg total) by mouth once daily, Taking estradioL (ESTRACE) 0.01 % (0.1 mg/gram) vaginal cream, Place 2 g vaginally twice a week, Taking folic acid/multivit,iron,miner (MULTIVIT-IRON-MIN-FOLIC ACID ORAL), Take 1 tablet by mouth once daily, Taking gabapentin (NEURONTIN) 300 MG capsule, Take 1 capsule (300 mg total) by mouth 3 (three) times daily Phantom limb pain, Taking melatonin 3 mg tablet, Take 1 tablet (3 mg total) by mouth at bedtime, Taking multivitamin with iron (COMPLETE MULTIVITAMIN-MINERAL) tablet, Take 1 tablet by mouth, Taking multivitamin with minerals, EYE, (PRESERVISION AREDS 2) soft gel capsule, Take 1 capsule by mouth 2 (two) times daily, Taking nystatin (MYCOSTATIN) 100,000 unit/gram powder, Apply small amount topically twice daily for rash under breasts, PRN Not Currently Taking pantoprazole (PROTONIX) 40 MG DR tablet, Take 1 tablet (40 mg total) by mouth 2 (two) times daily. Please call to schedule an appt. Thanks!, Taking polyethylene glycol (MIRALAX) powder, Take 17 g by mouth once daily Mix in 4-8ounces of fluid prior to taking., Taking sennosides-docusate (SENOKOT-S) 8.6-50 mg tablet, Take 1 tablet by mouth  2 (two) times daily For constipation, Taking lactose-reduced food (ENSURE PLUS ORAL), Take 237 mLs by mouth 2 (two) times daily With breakfast and dinner  HPI   CLINICAL SUMMARY:  Patient post right AKA and working with prosthesis. 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 Able to do housework, improving  PAIN:  Are you having pain? Yes: NPRS scale: 5/10 Pain location: L knee Pain description: aching Aggravating factors: walking Relieving factors: rest  PRECAUTIONS: None  WEIGHT BEARING RESTRICTIONS No  FALLS:  Has patient fallen in last 6 months? No  LIVING ENVIRONMENT: Lives with: lives with their spouse Lives in: House/apartment Stairs: No Has following equipment at home: Dan HumphreysWalker - 4 wheeled and Wheelchair (manual)  OCCUPATION: retired  PLOF: Requires assistive device for independence  PATIENT GOALS  improve standing tolerance/ pull up pants/ ambulate with SPC safely.    OBJECTIVE:   PATIENT SURVEYS:  FOTO initial 46/ goal [id number].   11/2: 50  COGNITION:  Overall cognitive status: Within functional limits for tasks assessed     SENSATION: WFL  POSTURE: rounded shoulders, forward head, flexed trunk , and weight shift left  PALPATION: No tenderness along R distal residual limb.  Discussed phantom limb sensation.    LOWER EXTREMITY ROM:  B LE AROM WFL except L knee extension secondary to articulating spacer.  Did not assess L/R hip extension at this time.    LOWER EXTREMITY MMT:  MMT Right eval Left eval  Hip flexion 4/5 4/5  Hip extension    Hip abduction 4+/5 4+/5  Hip adduction 4+/5 4+/5  Hip internal rotation    Hip external rotation    Knee flexion N/A 4+/5  Knee extension N/A 4+/5  Ankle dorsiflexion  5/5  Ankle plantarflexion    Ankle inversion    Ankle eversion     (Blank rows = not tested)  GAIT: Distance walked: in gym/ //-bars Assistive device utilized: Environmental consultantWalker - 2 wheeled Level of assistance: CGA Comments: Cuing for posture correction with mirror feedback.  Flexed posture/ heavy UE assist.     TODAY'S TREATMENT:  05/10/2022  Subjective:  Pt. has been assisting husband at home over past week after husband's TKA on 3/28.  Pt. Arrived in w/c and ready to work on gait/ standing  balance.  Pt. Has been wearing prosthetic leg at home but has not been walking.       Objective:   There.ex.:  No charge   Nustep L5 10 min. L UE/B LE warm-up prior to gait.  Improvement in prosthetic fit after Nustep.                Gait training:    Amb. From blue mat to Nustep with use of RW and focus on step through gait. and heel strike during and after the gait.  Cuing to increase R LE step length while maintaining consistent BOS/ knee mechanism extension/ heel strike.  SBA/CGA for safety with gait.  Marked improvement today with prosthetic fit which allows pt. To increase R LE wt. Bearing during L swing through phase of gait.        Walking in //-bars with focus on upright posture/ R step length to ensure heel strike/ full knee extension prior to wt. Bearing.  No episodes of R knee mechanism buckling.  Pt. More confident with gait pattern as tx. Progresses.  Forward/ backwards walking in //-bars with mirror feedback/ verbal cuing for posture correction.     Ascend/ descend  stairs with step to pattern (4 steps x 2) with heavy UE assist on handrails required.  Moderate B UE muscle fatigue/ flexed posture.    PATIENT EDUCATION:  Education details: HEP/ gait and prosthetic training.  Person educated: Patient and Spouse Education method: Explanation, Demonstration, and Verbal cues Education comprehension: verbalized understanding, returned demonstration, and tactile cues required   HOME EXERCISE PROGRAM: Prone position hip stretches/ Standing marching at kitchen counter with w/c behind patient.      ASSESSMENT:  CLINICAL IMPRESSION: Pt. Presents to PT in w/c and brought prosthetic leg.  Pt. Highly motivated to become more independent with use of RW while walking short distances in gym.  B UE/ shoulder fatigue with heavy UE assist with use of RW.  Pt. Limited to walking <100 feet at a time due to B shoulder/ UE fatigue and L knee pain.  Excellent fit with prosthetic leg today as  compared to previous tx. Session which allows for improved gait/ safety.   Pt. Will benefit from skilled PT services to improve standing tolerance/ gait independence and ADL.    OBJECTIVE IMPAIRMENTS Abnormal gait, decreased activity tolerance, decreased balance, decreased endurance, decreased mobility, difficulty walking, decreased ROM, decreased strength, decreased safety awareness, impaired flexibility, improper body mechanics, prosthetic dependency , and pain.   ACTIVITY LIMITATIONS carrying, lifting, standing, squatting, stairs, transfers, bed mobility, bathing, toileting, dressing, hygiene/grooming, and locomotion level  PARTICIPATION LIMITATIONS: cleaning, laundry, driving, shopping, community activity, and yard work  PERSONAL FACTORS Past/current experiences are also affecting patient's functional outcome.   REHAB POTENTIAL: Good  CLINICAL DECISION MAKING: Evolving/moderate complexity  EVALUATION COMPLEXITY: High   GOALS: Goals reviewed with patient Yes  LONG TERM GOALS: Target date: 05/24/22  Pt. Will increase FOTO to 54 to improve functional mobility.  Baseline: initial FOTO 46.  11/2: 50 Goal status: Partially met  2.  Pt. Able to tolerate standing 10 minutes while pulling up pants with mod. I safely while wearing prosthetic leg to improve daily activities.  Baseline:  limited standing tolerance Goal status: Not met  3.  Pt. Will ambulate 200 feet with use of SPC and mod. I to promote safety with household tasks/ walking into bathroom.   Baseline:  amb. With RW and min. A for safety.  2/27: walking in clinic with SBA/mod. I for 100 feet with RW and use of prosthetic leg  Goal status: Not met  4.  Pt. Will ascend 10 stairs with use of single forearm crutch/ handrail and step to pattern with mod. I safely.  Baseline:  see above Goal status: Initial    PLAN: PT FREQUENCY: 2x/week  PT DURATION: 8 weeks  PLANNED INTERVENTIONS: Therapeutic exercises, Therapeutic  activity, Neuromuscular re-education, Balance training, Gait training, Patient/Family education, Self Care, Joint mobilization, Prosthetic training, DME instructions, and Manual therapy  PLAN FOR NEXT SESSION:  Progress gait/ standing tolerance.   Cammie Mcgee, PT, DPT # [id number] 7:31 PM,05/10/22

## 2022-05-12 ENCOUNTER — Ambulatory Visit: Payer: Medicare Other | Admitting: Physical Therapy

## 2022-05-12 ENCOUNTER — Encounter: Payer: Self-pay | Admitting: Physical Therapy

## 2022-05-12 DIAGNOSIS — M6281 Muscle weakness (generalized): Secondary | ICD-10-CM

## 2022-05-12 DIAGNOSIS — R269 Unspecified abnormalities of gait and mobility: Secondary | ICD-10-CM

## 2022-05-12 DIAGNOSIS — Z89611 Acquired absence of right leg above knee: Secondary | ICD-10-CM

## 2022-05-12 DIAGNOSIS — R293 Abnormal posture: Secondary | ICD-10-CM

## 2022-05-12 NOTE — Therapy (Addendum)
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   Hypertension    PONV (postoperative nausea and vomiting)    Seasonal allergies    Sleep apnea    does not uses CPAP machine anymore    Wheelchair dependent    able to self transfer   Past Surgical History:  Procedure Laterality Date   APPENDECTOMY     CATARACT EXTRACTION W/PHACO Right 02/28/2022   Procedure: CATARACT EXTRACTION PHACO AND INTRAOCULAR LENS PLACEMENT (IOC) RIGHT  4.82  00:30.9;  Surgeon: Nevada Crane, MD;  Location: [facility];  Service: Ophthalmology;  Laterality: Right;  sleep apnea   CATARACT EXTRACTION W/PHACO Left 03/14/2022   Procedure: CATARACT EXTRACTION PHACO AND INTRAOCULAR LENS PLACEMENT (IOC) LEFT;  Surgeon: Nevada Crane, MD;  Location: [facility];  Service: Ophthalmology;  Laterality: Left;  4.32 0:39.7   CESAREAN SECTION     CHOLECYSTECTOMY     DILATION AND CURETTAGE OF UTERUS     ESOPHAGOGASTRODUODENOSCOPY N/A 02/22/2019   Procedure: ESOPHAGOGASTRODUODENOSCOPY (EGD);  Surgeon: Toledo, Boykin Nearing, MD;  Location: [facility];  Service: Gastroenterology;  Laterality: N/A;   FLEXIBLE SIGMOIDOSCOPY N/A 02/22/2019   Procedure: FLEXIBLE SIGMOIDOSCOPY;  Surgeon: Toledo, Boykin Nearing, MD;  Location: [facility];  Service: Gastroenterology;  Laterality: N/A;   HARDWARE REMOVAL Right 11/12/2019   Procedure:  Right ankle hardware removal;  Surgeon: Kennedy Bucker, MD;  Location: [facility];  Service: Orthopedics;  Laterality: Right;   HARDWARE REMOVAL Right 04/17/2020   Procedure: HARDWARE REMOVAL FROM RIGHT ANKLE AND RIGHT KNEE; IMPLANT OF CEMENT SPACERS IN RIGHT KNEE;  Surgeon: Donato Heinz, MD;  Location: [facility];  Service: Orthopedics;  Laterality: Right;   JOINT REPLACEMENT     LEG AMPUTATION ABOVE KNEE Right 12/02/2020   [facility]   ORIF ANKLE FRACTURE Right 03/14/2019   Procedure: OPEN REDUCTION INTERNAL FIXATION (ORIF) ANKLE FRACTURE, MEDIAL MALLEOLUS;  Surgeon: Kennedy Bucker, MD;  Location: [facility];  Service: Orthopedics;  Laterality: Right;   ORIF ANKLE FRACTURE Right 02/08/2019   Procedure: OPEN REDUCTION INTERNAL FIXATION (ORIF) ANKLE FRACTURE, post malleolus;  Surgeon: Kennedy Bucker, MD;  Location: [facility];  Service: Orthopedics;  Laterality: Right;   PARTIAL HYSTERECTOMY  1993   REPLACEMENT TOTAL KNEE Bilateral 2008   2009   SCAR DEBRIDEMENT OF TOTAL KNEE  01/2020   SYNDESMOSIS REPAIR Right 02/08/2019   Procedure: SYNDESMOSIS REPAIR;  Surgeon: Kennedy Bucker, MD;  Location: [facility];  Service: Orthopedics;  Laterality: Right;   TEE WITHOUT CARDIOVERSION N/A 02/26/2019   Procedure: TRANSESOPHAGEAL ECHOCARDIOGRAM (TEE);  Surgeon: Lamar Blinks, MD;  Location: [facility];  Service: Cardiovascular;  Laterality: N/A;   TOTAL KNEE REVISION WITH SCAR DEBRIDEMENT/PATELLA REVISION WITH POLY EXCHANGE Right 01/24/2020  Procedure: TOTAL KNEE REVISION WITH SCAR DEBRIDEMENT/PATELLA REVISION WITH POLY EXCHANGE;  Surgeon: Donato HeinzHooten, James P, MD;  Location: [facility];  Service: Orthopedics;  Laterality: Right;   TUMOR REMOVAL     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   Patient Active Problem List   Diagnosis Date Noted   Chronic infection of prosthetic knee 04/17/2020   Anxiety 04/17/2020   History of GI diverticular bleed 04/17/2020   Chest pain 04/17/2020   PAD (peripheral artery disease) 04/05/2020    Ulcer of right leg 04/05/2020   COPD (chronic obstructive pulmonary disease) 04/05/2020   GERD (gastroesophageal reflux disease) 04/05/2020   Abscess of right knee 01/22/2020   Pressure injury of skin 02/28/2019   SOB (shortness of breath)    Gastrointestinal hemorrhage    Sepsis    Symptomatic anemia 02/20/2019   Trimalleolar fracture of ankle, closed, right, initial encounter 02/07/2019   Multiple lung nodules 03/31/2014   Extrinsic asthma 03/27/2014   OSA on CPAP 03/27/2014    PCP: Danella PentonMiller, Mark F, MD  REFERRING PROVIDER: Danella PentonMiller, Mark F, MD  REFERRING DIAG: Above knee amputation of right lower extremity  THERAPY DIAG:  S/P AKA (above knee amputation) unilateral, right  Muscle weakness (generalized)  Gait difficulty  Abnormal posture  Rationale for Evaluation and Treatment Rehabilitation  ONSET DATE: 12/02/20  SUBJECTIVE:  EVALUATION  PERTINENT HISTORY: Patient Profile:   Jasmine Morrow is a [age] y.o. female Chief Complaint  Patient presents with  Visit Follow Up  Doing well.   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 Cardiac cath 10/2011 showed completely normal coronary arteries, however she did have mild left ventricular dysfunction with anterior wall motion abnormality. Left ventricular EF was 51%.  History of chickenpox  Hyperlipidemia  patient states she has never been  diagnosed with this  Hypertension  Knee joint replacement by other means  Macular degeneration  Major depressive disorder, recurrent, mild (CMS-HCC)  Medicare annual wellness visit, initial 08/09/2019  7/21  Multiple thyroid nodules  Normal coronary arteries 09/06/2013  Normal coronary anatomy by cardiac catheterization 10/12/11  Osteoarthritis  Ovarian cyst  PONV (postoperative nausea and vomiting)  nausea  Postoperative urinary retention 12/08/2020  Rash on lips 12/21/2020  Seasonal allergies  Sinusitis, unspecified  Sleep apnea  On CPAP.  Slow transit constipation 12/10/2020  Trimalleolar fracture of ankle, closed, right, initial encounter 02/07/2019  Complicated by staph infection 1/21, 4 weeks IV Ancef, Menz  Ulcer   Past Surgical History:  Procedure Laterality Date  SALPINGO OOPHORECTOMY Bilateral 1993  Right total knee arthroplasty 10/11/2006  Dr Ernest PineHooten  Left total knee arthroplasty 10/12/2007  Dr Ernest PineHooten  COLONOSCOPY 07/30/2012  internal hemorrhoids, diverticulosis  ANKLE ARTHODESIS W/ ARTHROSCOPY Right 02/08/2019  03/14/2019 second surgery  COLONOSCOPY 02/22/2019  Blood in entire colon/Diverticulosis - Presumed diverticular bleed. No repeat recommended per TKT.  EGD 02/22/2019  Gastritis/gastric ulcers/Hiatal hernia/Otherwise normal - no repeat recommended per TKT.  ORIF ANKLE FRACTURE Right 03/14/2019  Dr. Rosita KeaMenz  REMOVAL HARDWARE ANKLE FOOT/TOES Right 11/12/2019  Dr. Rosita Kea  Right knee arthrotomy, irrigation and debridement of the right knee, polyethylene exchange 01/24/2020  Dr Ernest Pine  Removal of hardware (plates and screws) from the right ankle with irrigation, debridement, and placement of Stimulan antibiotic beads 04/17/2020  Dr Ernest Pine  Right knee arthrotomy, extensive irrigation debridement, removal of right total knee implants, and placement of antibiotic impregnated polymethylmethacrylate cement spacer 04/17/2020  Dr Ernest Pine  AMPUTATION LEG ABOVE KNEE AKA  Right 12/02/2020  Procedure: AMPUTATION, THIGH, THROUGH FEMUR, ANY LEVEL; Surgeon: Starr Lake, MD; Location: [facility]; Service: Orthopedics; Laterality: Right;  TRANSFER ADJACENT TISSUE LEG Right 12/02/2020  Procedure: ADJACENT TISSUE TRANSFER OR REARRANGEMENT, LEG; DEFECT 10 SQ CM OR LESS; Surgeon: Starr Lake, MD; Location: [facility]; Service: Orthopedics; Laterality: Right;  ASPIRATION/INJECTION MAJOR JOINT/BURSA KNEE Left 12/02/2020  Procedure: ARTHROCENTESIS, ASPIRATION AND/OR INJECTION, MAJOR JOINT OR BURSA, KNEE; WITHOUT ULTRASOUND GUIDANCE; Surgeon: Starr Lake, MD; Location: [facility]; Service: Orthopedics; Laterality: Left;  REMOVAL KNEE PROSTHESIS W/POSSIBLE PLACEMENT SPACER Left 01/18/2021  Procedure: REMOVAL OF PROSTHESIS, INCLUDING TOTAL KNEE PROSTHESIS, METHYLMETHACRYLATE WITH OR WITHOUT INSERTION OF SPACER, KNEE; Surgeon: Celine Mans, MD; Location: [facility]; Service: Orthopedics; Laterality: Left;  INSERTION NON-BIODEGRADABLE DRUG DELIVERY IMPLANT Left 01/18/2021  Procedure: INSERTION, KNEE, bioresorbable, biodegradable, NON-BIODEGRADABLE DRUG DELIVERY IMPLANT; Surgeon: Celine Mans, MD; Location: [facility]; Service: Orthopedics; Laterality: Left;  ABOVE KNEE LEG AMPUTATION  12/02/2020  APPENDECTOMY Est 1968  CESAREAN SECTION  CHOLECYSTECTOMY  FRACTURE SURGERY 1/21  HYSTERECTOMY partial  JOINT REPLACEMENT 2008 & 2009  TUBAL LIGATION 3/79   ALLERGIES: Allergies  Allergen Reactions  Singulair [Montelukast] Other (See Comments)  Gland swelling  Sulfa (Sulfonamide Antibiotics) Swelling  Oxycodone Dizziness and Other (See Comments)  Vancomycin Analogues Other (See Comments)  Ringing in ears  Avinza [Morphine] Itching  Darvocet A500 [Propoxyphene N-Acetaminophen] Nausea  Nickel Rash  Ultracet [Tramadol-Acetaminophen] Rash  Vicodin [Hydrocodone-Acetaminophen] Vomiting  Vioxx [Rofecoxib] Other (See  Comments)  GI   CURRENT MEDICATIONS: Current Outpatient Medications: acetaminophen (TYLENOL) 500 MG tablet, Take 1,000 mg by mouth 3 (three) times a day Twice daily per patient., PRN Not Currently Taking aspirin 81 MG EC tablet, Take 1 tablet (81 mg total) by mouth at bedtime, Taking calcium carbonate-vitamin D3 (CALTRATE 600+D) 600 mg-10 mcg (400 unit) tablet, Take 0.5 tablets by mouth 2 (two) times daily, Taking cetirizine (ZYRTEC) 10 mg capsule, Take 1 capsule (10 mg total) by mouth once daily, Taking escitalopram oxalate (LEXAPRO) 10 MG tablet, Take 1 tablet (10 mg total) by mouth once daily, Taking estradioL (ESTRACE) 0.01 % (0.1 mg/gram) vaginal cream, Place 2 g vaginally twice a week, Taking folic acid/multivit,iron,miner (MULTIVIT-IRON-MIN-FOLIC ACID ORAL), Take 1 tablet by mouth once daily, Taking gabapentin (NEURONTIN) 300 MG capsule, Take 1 capsule (300 mg total) by mouth 3 (three) times daily Phantom limb pain, Taking melatonin 3 mg tablet, Take 1 tablet (3 mg total) by mouth at bedtime, Taking multivitamin with iron (COMPLETE MULTIVITAMIN-MINERAL) tablet, Take 1 tablet by mouth, Taking multivitamin with minerals, EYE, (PRESERVISION AREDS 2) soft gel capsule, Take 1 capsule by mouth 2 (two) times daily, Taking nystatin (MYCOSTATIN) 100,000 unit/gram powder, Apply small amount topically twice daily for rash under breasts, PRN Not Currently Taking pantoprazole (PROTONIX) 40 MG DR tablet, Take 1 tablet (40 mg total) by mouth 2 (two) times daily. Please call to schedule an appt. Thanks!, Taking polyethylene glycol (MIRALAX) powder, Take 17 g by mouth once daily Mix in 4-8ounces  of fluid prior to taking., Taking sennosides-docusate (SENOKOT-S) 8.6-50 mg tablet, Take 1 tablet by mouth 2 (two) times daily For constipation, Taking lactose-reduced food (ENSURE PLUS ORAL), Take 237 mLs by mouth 2 (two) times daily With breakfast and dinner  HPI   CLINICAL SUMMARY:  Patient post right AKA  and working with prosthesis. 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 Able to do housework, improving  PAIN:  Are you having pain? Yes: NPRS scale: 5/10 Pain location: L knee Pain description: aching Aggravating factors: walking Relieving factors: rest  PRECAUTIONS: None  WEIGHT BEARING RESTRICTIONS No  FALLS:  Has patient fallen in last 6 months? No  LIVING ENVIRONMENT: Lives with: lives with their spouse Lives in: House/apartment Stairs: No Has following equipment at home: Dan Humphreys - 4 wheeled and Wheelchair (manual)  OCCUPATION: retired  PLOF: Requires assistive device for independence  PATIENT GOALS  improve standing tolerance/ pull up pants/ ambulate with SPC safely.    OBJECTIVE:   PATIENT SURVEYS:  FOTO initial 46/ goal 93.   11/2: 50  COGNITION:  Overall cognitive status: Within functional limits for tasks assessed     SENSATION: WFL  POSTURE: rounded shoulders, forward head, flexed trunk , and weight shift left  PALPATION: No tenderness along R distal residual limb.  Discussed phantom limb sensation.    LOWER EXTREMITY ROM:  B LE AROM WFL except L knee extension secondary to articulating spacer.  Did not assess L/R hip extension at this time.    LOWER EXTREMITY MMT:  MMT Right eval Left eval  Hip flexion 4/5 4/5  Hip extension    Hip abduction 4+/5 4+/5  Hip adduction 4+/5 4+/5  Hip internal rotation    Hip external rotation    Knee flexion N/A 4+/5  Knee extension N/A 4+/5  Ankle dorsiflexion  5/5  Ankle plantarflexion    Ankle inversion    Ankle eversion     (Blank rows = not tested)  GAIT: Distance walked: in gym/ //-bars Assistive device utilized: Environmental consultant - 2 wheeled Level of assistance: CGA Comments: Cuing for posture correction with mirror feedback.  Flexed posture/ heavy UE assist.     TODAY'S TREATMENT:  05/12/2022  Subjective:  Pt. Arrived in w/c and ready to work on gait/ standing balance.  Pt. Has  been wearing prosthetic leg at home but has not been walking.  Pt. Understands that she needs to walk at home to improve carryover from PT.      Objective:   There.ex.:  No charge   Nustep L5 10 min. L UE/B LE warm-up prior to gait.  Improvement in prosthetic fit after Nustep.  Prosthetic not fitting as well today as compared to last tx.              Gait training:    Amb. From blue mat to Nustep with use of RW and focus on step through gait.  CGA/min. A on R with ensure R knee mechanism locks out before wt. Bearing.  Cuing to increase R LE step length while maintaining consistent BOS/ knee mechanism extension/ heel strike.  SBA/CGA for safety with gait.  Extra time to ensure R knee mechanism locks out.  PT adjusted/tightened fit several times during gait to Nustep.          Walking in //-bars with focus on upright posture/ R step length to ensure heel strike/ full knee extension prior to wt. Bearing.  No episodes of R knee mechanism buckling.  Pt. More confident with gait pattern as tx. Progresses.  Forward/ backwards walking in //-bars with mirror feedback/ verbal cuing for posture correction.     No stairs today.  Walk around PT clinic (1 lap) with SBA and use of RW with consistent step pattern.  Cuing to correct posture.    PATIENT EDUCATION:  Education details: HEP/ gait and prosthetic training.  Person educated: Patient and Spouse Education method: Explanation, Demonstration, and Verbal cues Education comprehension: verbalized understanding, returned demonstration, and tactile cues required   HOME EXERCISE PROGRAM: Prone position hip stretches/ Standing marching at kitchen counter with w/c behind patient.      ASSESSMENT:  CLINICAL IMPRESSION: Pt. Presents to PT in w/c and brought prosthetic leg.  Pt. Highly motivated to become more independent with use of RW while walking short distances in gym.  B UE/ shoulder fatigue with heavy UE assist with use of RW.  Pt. Limited to walking  <100 feet at a time due to B shoulder/ UE fatigue and L knee pain.  No LOB but pt. Cautious with initial steps after standing.   Pt. Will benefit from skilled PT services to improve standing tolerance/ gait independence and ADL.    OBJECTIVE IMPAIRMENTS Abnormal gait, decreased activity tolerance, decreased balance, decreased endurance, decreased mobility, difficulty walking, decreased ROM, decreased strength, decreased safety awareness, impaired flexibility, improper body mechanics, prosthetic dependency , and pain.   ACTIVITY LIMITATIONS carrying, lifting, standing, squatting, stairs, transfers, bed mobility, bathing, toileting, dressing, hygiene/grooming, and locomotion level  PARTICIPATION LIMITATIONS: cleaning, laundry, driving, shopping, community activity, and yard work  PERSONAL FACTORS Past/current experiences are also affecting patient's functional outcome.   REHAB POTENTIAL: Good  CLINICAL DECISION MAKING: Evolving/moderate complexity  EVALUATION COMPLEXITY: High   GOALS: Goals reviewed with patient Yes  LONG TERM GOALS: Target date: 05/24/22  Pt. Will increase FOTO to 54 to improve functional mobility.  Baseline: initial FOTO 46.  11/2: 50 Goal status: Partially met  2.  Pt. Able to tolerate standing 10 minutes while pulling up pants with mod. I safely while wearing prosthetic leg to improve daily activities.  Baseline:  limited standing tolerance Goal status: Not met  3.  Pt. Will ambulate 200 feet with use of SPC and mod. I to promote safety with household tasks/ walking into bathroom.   Baseline:  amb. With RW and min. A for safety.  2/27: walking in clinic with SBA/mod. I for 100 feet with RW and use of prosthetic leg  Goal status: Not met  4.  Pt. Will ascend 10 stairs with use of single forearm crutch/ handrail and step to pattern with mod. I safely.  Baseline:  see above Goal status: Not met    PLAN: PT FREQUENCY: 2x/week  PT DURATION: 8 weeks  PLANNED  INTERVENTIONS: Therapeutic exercises, Therapeutic activity, Neuromuscular re-education, Balance training, Gait training, Patient/Family education, Self Care, Joint mobilization, Prosthetic training, DME instructions, and Manual therapy  PLAN FOR NEXT SESSION:  Progress gait/ standing tolerance.   Cammie Mcgee, PT, DPT # [phone] 5:44 PM,05/12/22

## 2022-05-17 ENCOUNTER — Ambulatory Visit: Payer: Medicare Other | Admitting: Physical Therapy

## 2022-05-19 ENCOUNTER — Ambulatory Visit: Payer: Medicare Other | Admitting: Physical Therapy

## 2022-05-19 ENCOUNTER — Encounter: Payer: Self-pay | Admitting: Physical Therapy

## 2022-05-19 DIAGNOSIS — Z89611 Acquired absence of right leg above knee: Secondary | ICD-10-CM

## 2022-05-19 DIAGNOSIS — R269 Unspecified abnormalities of gait and mobility: Secondary | ICD-10-CM

## 2022-05-19 DIAGNOSIS — R293 Abnormal posture: Secondary | ICD-10-CM

## 2022-05-19 DIAGNOSIS — M6281 Muscle weakness (generalized): Secondary | ICD-10-CM

## 2022-05-19 NOTE — Therapy (Signed)
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   Hypertension    PONV (postoperative nausea and vomiting)    Seasonal allergies    Sleep apnea    does not uses CPAP machine anymore    Wheelchair dependent    able to self transfer   Past Surgical History:  Procedure Laterality Date   APPENDECTOMY     CATARACT EXTRACTION W/PHACO Right 02/28/2022   Procedure: CATARACT EXTRACTION PHACO AND INTRAOCULAR LENS PLACEMENT (IOC) RIGHT  4.82  00:30.9;  Surgeon: Nevada Crane, MD;  Location: [facility];  Service: Ophthalmology;  Laterality: Right;  sleep apnea   CATARACT EXTRACTION W/PHACO Left 03/14/2022   Procedure: CATARACT EXTRACTION PHACO AND INTRAOCULAR LENS PLACEMENT (IOC) LEFT;  Surgeon: Nevada Crane, MD;  Location: [facility];  Service: Ophthalmology;  Laterality: Left;  4.32 0:39.7   CESAREAN SECTION     CHOLECYSTECTOMY     DILATION AND CURETTAGE OF UTERUS     ESOPHAGOGASTRODUODENOSCOPY N/A 02/22/2019   Procedure: ESOPHAGOGASTRODUODENOSCOPY (EGD);  Surgeon: Toledo, Boykin Nearing, MD;  Location: [facility];  Service: Gastroenterology;  Laterality: N/A;   FLEXIBLE SIGMOIDOSCOPY N/A 02/22/2019   Procedure: FLEXIBLE SIGMOIDOSCOPY;  Surgeon: Toledo, Boykin Nearing, MD;  Location: [facility];  Service: Gastroenterology;  Laterality: N/A;   HARDWARE REMOVAL Right 11/12/2019   Procedure: Right ankle hardware removal;  Surgeon: Kennedy Bucker, MD;  Location: [facility];   Service: Orthopedics;  Laterality: Right;   HARDWARE REMOVAL Right 04/17/2020   Procedure: HARDWARE REMOVAL FROM RIGHT ANKLE AND RIGHT KNEE; IMPLANT OF CEMENT SPACERS IN RIGHT KNEE;  Surgeon: Donato Heinz, MD;  Location: [facility];  Service: Orthopedics;  Laterality: Right;   JOINT REPLACEMENT     LEG AMPUTATION ABOVE KNEE Right 12/02/2020   [facility]   ORIF ANKLE FRACTURE Right 03/14/2019   Procedure: OPEN REDUCTION INTERNAL FIXATION (ORIF) ANKLE FRACTURE, MEDIAL MALLEOLUS;  Surgeon: Kennedy Bucker, MD;  Location: [facility];  Service: Orthopedics;  Laterality: Right;   ORIF ANKLE FRACTURE Right 02/08/2019   Procedure: OPEN REDUCTION INTERNAL FIXATION (ORIF) ANKLE FRACTURE, post malleolus;  Surgeon: Kennedy Bucker, MD;  Location: [facility];  Service: Orthopedics;  Laterality: Right;   PARTIAL HYSTERECTOMY  1993   REPLACEMENT TOTAL KNEE Bilateral 2008   2009   SCAR DEBRIDEMENT OF TOTAL KNEE  01/2020   SYNDESMOSIS REPAIR Right 02/08/2019   Procedure: SYNDESMOSIS REPAIR;  Surgeon: Kennedy Bucker, MD;  Location: [facility];  Service: Orthopedics;  Laterality: Right;   TEE WITHOUT CARDIOVERSION N/A 02/26/2019   Procedure: TRANSESOPHAGEAL ECHOCARDIOGRAM (TEE);  Surgeon: Lamar Blinks, MD;  Location: [facility];  Service: Cardiovascular;  Laterality: N/A;   TOTAL KNEE REVISION WITH SCAR DEBRIDEMENT/PATELLA REVISION WITH POLY EXCHANGE Right 01/24/2020   Procedure: TOTAL KNEE REVISION WITH SCAR DEBRIDEMENT/PATELLA REVISION WITH POLY EXCHANGE;  Surgeon: Donato Heinz,  MD;  Location: [facility];  Service: Orthopedics;  Laterality: Right;   TUMOR REMOVAL     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   Patient Active Problem List   Diagnosis Date Noted   Chronic infection of prosthetic knee 04/17/2020   Anxiety 04/17/2020   History of GI diverticular bleed 04/17/2020   Chest pain 04/17/2020   PAD (peripheral artery disease) 04/05/2020   Ulcer of right leg 04/05/2020   COPD (chronic obstructive pulmonary disease)  04/05/2020   GERD (gastroesophageal reflux disease) 04/05/2020   Abscess of right knee 01/22/2020   Pressure injury of skin 02/28/2019   SOB (shortness of breath)    Gastrointestinal hemorrhage    Sepsis    Symptomatic anemia 02/20/2019   Trimalleolar fracture of ankle, closed, right, initial encounter 02/07/2019   Multiple lung nodules 03/31/2014   Extrinsic asthma 03/27/2014   OSA on CPAP 03/27/2014    PCP: Rusty Aus, MD  REFERRING PROVIDER: Rusty Aus, MD  REFERRING DIAG: Above knee amputation of right lower extremity  THERAPY DIAG:  S/P AKA (above knee amputation) unilateral, right  Muscle weakness (generalized)  Gait difficulty  Abnormal posture  Rationale for Evaluation and Treatment Rehabilitation  ONSET DATE: 12/02/20  SUBJECTIVE:  EVALUATION  PERTINENT HISTORY: Patient Profile:   Jasmine Morrow is a 77 y.o. female Chief Complaint  Patient presents with  Visit Follow Up  Doing well.   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 Cardiac cath 10/2011 showed completely normal coronary arteries, however she did have mild left ventricular dysfunction with anterior wall motion abnormality. Left ventricular EF was 51%.  History of chickenpox  Hyperlipidemia  patient states she has never been diagnosed with this  Hypertension  Knee joint replacement by other means   Macular degeneration  Major depressive disorder, recurrent, mild (CMS-HCC)  Medicare annual wellness visit, initial 08/09/2019  7/21  Multiple thyroid nodules  Normal coronary arteries 09/06/2013  Normal coronary anatomy by cardiac catheterization 10/12/11  Osteoarthritis  Ovarian cyst  PONV (postoperative nausea and vomiting)  nausea  Postoperative urinary retention 12/08/2020  Rash on lips 12/21/2020  Seasonal allergies  Sinusitis, unspecified  Sleep apnea  On CPAP.  Slow transit constipation 12/10/2020  Trimalleolar fracture of ankle, closed, right, initial encounter [id number]  Complicated by staph infection 1/21, 4 weeks IV Ancef, Menz  Ulcer   Past Surgical History:  Procedure Laterality Date  SALPINGO OOPHORECTOMY Bilateral 1993  Right total knee arthroplasty 10/11/2006  Dr Marry Guan  Left total knee arthroplasty 10/12/2007  Dr Marry Guan  COLONOSCOPY 07/30/2012  internal hemorrhoids, diverticulosis  ANKLE ARTHODESIS W/ ARTHROSCOPY Right 02/08/2019  03/14/2019 second surgery  COLONOSCOPY 02/22/2019  Blood in entire colon/Diverticulosis - Presumed diverticular bleed. No repeat recommended per TKT.  EGD 02/22/2019  Gastritis/gastric ulcers/Hiatal hernia/Otherwise normal - no repeat recommended per TKT.  ORIF ANKLE FRACTURE Right 03/14/2019  Dr. Rudene Christians  REMOVAL HARDWARE ANKLE FOOT/TOES Right 11/12/2019  Dr. Rudene Christians  Right knee arthrotomy, irrigation and debridement  of the right knee, polyethylene exchange 01/24/2020  Dr Marry Guan  Removal of hardware (plates and screws) from the right ankle with irrigation, debridement, and placement of Stimulan antibiotic beads 04/17/2020  Dr Marry Guan  Right knee arthrotomy, extensive irrigation debridement, removal of right total knee implants, and placement of antibiotic impregnated polymethylmethacrylate cement spacer 04/17/2020  Dr Marry Guan  AMPUTATION LEG ABOVE KNEE AKA Right 12/02/2020  Procedure: AMPUTATION, THIGH, THROUGH FEMUR, ANY LEVEL;  Surgeon: Lyndle Herrlich, MD; Location: [place]; Service: Orthopedics; Laterality: Right;  TRANSFER ADJACENT TISSUE LEG Right 12/02/2020  Procedure: ADJACENT TISSUE TRANSFER OR REARRANGEMENT, LEG; DEFECT 10 SQ CM OR LESS; Surgeon: Lyndle Herrlich, MD; Location: [place]; Service: Orthopedics; Laterality: Right;  ASPIRATION/INJECTION MAJOR JOINT/BURSA KNEE Left 12/02/2020  Procedure: ARTHROCENTESIS, ASPIRATION AND/OR INJECTION, MAJOR JOINT OR BURSA, KNEE; WITHOUT ULTRASOUND GUIDANCE; Surgeon: Lyndle Herrlich, MD; Location: [place]; Service: Orthopedics; Laterality: Left;  REMOVAL KNEE PROSTHESIS W/POSSIBLE PLACEMENT SPACER Left 01/18/2021  Procedure: REMOVAL OF PROSTHESIS, INCLUDING TOTAL KNEE PROSTHESIS, METHYLMETHACRYLATE WITH OR WITHOUT INSERTION OF SPACER, KNEE; Surgeon: Ihor Dow, MD; Location: [place]; Service: Orthopedics; Laterality: Left;  INSERTION NON-BIODEGRADABLE DRUG DELIVERY IMPLANT Left 01/18/2021  Procedure: INSERTION, KNEE, bioresorbable, biodegradable, NON-BIODEGRADABLE DRUG DELIVERY IMPLANT; Surgeon: Ihor Dow, MD; Location: [place]; Service: Orthopedics; Laterality: Left;  ABOVE KNEE LEG AMPUTATION  12/02/2020  APPENDECTOMY Est 1968  CESAREAN SECTION  CHOLECYSTECTOMY  FRACTURE SURGERY 1/21  HYSTERECTOMY partial  JOINT REPLACEMENT 2008 & 2009  TUBAL LIGATION 3/79   ALLERGIES: Allergies  Allergen Reactions  Singulair [Montelukast] Other (See Comments)  Gland swelling  Sulfa (Sulfonamide Antibiotics) Swelling  Oxycodone Dizziness and Other (See Comments)  Vancomycin Analogues Other (See Comments)  Ringing in ears  Avinza [Morphine] Itching  Darvocet A500 [Propoxyphene N-Acetaminophen] Nausea  Nickel Rash  Ultracet [Tramadol-Acetaminophen] Rash  Vicodin [Hydrocodone-Acetaminophen] Vomiting  Vioxx [Rofecoxib] Other (See Comments)  GI   CURRENT MEDICATIONS: Current Outpatient Medications:  acetaminophen (TYLENOL) 500 MG tablet, Take 1,000 mg by mouth 3 (three) times a day Twice daily per patient., PRN Not Currently Taking aspirin 81 MG EC tablet, Take 1 tablet (81 mg total) by mouth at bedtime, Taking calcium carbonate-vitamin D3 (CALTRATE 600+D) 600 mg-10 mcg (400 unit) tablet, Take 0.5 tablets by mouth 2 (two) times daily, Taking cetirizine (ZYRTEC) 10 mg capsule, Take 1 capsule (10 mg total) by mouth once daily, Taking escitalopram oxalate (LEXAPRO) 10 MG tablet, Take 1 tablet (10 mg total) by mouth once daily, Taking estradioL (ESTRACE) 0.01 % (0.1 mg/gram) vaginal cream, Place 2 g vaginally twice a week, Taking folic acid/multivit,iron,miner (MULTIVIT-IRON-MIN-FOLIC ACID ORAL), Take 1 tablet by mouth once daily, Taking gabapentin (NEURONTIN) 300 MG capsule, Take 1 capsule (300 mg total) by mouth 3 (three) times daily Phantom limb pain, Taking melatonin 3 mg tablet, Take 1 tablet (3 mg total) by mouth at bedtime, Taking multivitamin with iron (COMPLETE MULTIVITAMIN-MINERAL) tablet, Take 1 tablet by mouth, Taking multivitamin with minerals, EYE, (PRESERVISION AREDS 2) soft gel capsule, Take 1 capsule by mouth 2 (two) times daily, Taking nystatin (MYCOSTATIN) 100,000 unit/gram powder, Apply small amount topically twice daily for rash under breasts, PRN Not Currently Taking pantoprazole (PROTONIX) 40 MG DR tablet, Take 1 tablet (40 mg total) by mouth 2 (two) times daily. Please call to schedule an appt. Thanks!, Taking polyethylene glycol (MIRALAX) powder, Take 17 g by mouth once daily Mix in 4-8ounces of fluid prior to taking., Taking sennosides-docusate (SENOKOT-S) 8.6-50 mg tablet, Take 1 tablet by mouth  2 (two) times daily For constipation, Taking lactose-reduced food (ENSURE PLUS ORAL), Take 237 mLs by mouth 2 (two) times daily With breakfast and dinner  HPI   CLINICAL SUMMARY:  Patient post right AKA and working with prosthesis. 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 Able to do housework, improving  PAIN:  Are you having pain? Yes: NPRS scale: 5/10 Pain location: L knee Pain description: aching Aggravating factors: walking Relieving factors: rest  PRECAUTIONS: None  WEIGHT BEARING RESTRICTIONS No  FALLS:  Has patient fallen in last 6 months? No  LIVING ENVIRONMENT: Lives with: lives with their spouse Lives in: House/apartment Stairs: No Has following equipment at home: Dan Humphreys - 4 wheeled and Wheelchair (manual)  OCCUPATION: retired  PLOF: Requires assistive device for independence  PATIENT GOALS  improve standing tolerance/ pull up pants/ ambulate with SPC safely.    OBJECTIVE:   PATIENT SURVEYS:  FOTO initial 46/ goal 35.   11/2: 50  COGNITION:  Overall cognitive status: Within functional limits for tasks assessed     SENSATION: WFL  POSTURE: rounded shoulders, forward head, flexed trunk , and weight shift left  PALPATION: No tenderness along R distal residual limb.  Discussed phantom limb sensation.    LOWER EXTREMITY ROM:  B LE AROM WFL except L knee extension secondary to articulating spacer.  Did not assess L/R hip extension at this time.    LOWER EXTREMITY MMT:  MMT Right eval Left eval  Hip flexion 4/5 4/5  Hip extension    Hip abduction 4+/5 4+/5  Hip adduction 4+/5 4+/5  Hip internal rotation    Hip external rotation    Knee flexion N/A 4+/5  Knee extension N/A 4+/5  Ankle dorsiflexion  5/5  Ankle plantarflexion    Ankle inversion    Ankle eversion     (Blank rows = not tested)  GAIT: Distance walked: in gym/ //-bars Assistive device utilized: Environmental consultant - 2 wheeled Level of assistance: CGA Comments: Cuing for posture correction with mirror feedback.  Flexed posture/ heavy UE assist.     TODAY'S TREATMENT:  05/19/2022  Subjective:  Pt. Arrived in w/c and ready to work on gait/ standing balance.  Pt. Has been wearing prosthetic leg at home but has not been walking.  Pt.  Understands that she needs to walk at home to improve carryover from PT.  Pt. Missed last PT appt. Due to husband not feeling well.    Objective:   There.ex.:  No charge   Nustep L5 10 min. L UE/B LE warm-up prior to gait.  Improvement in prosthetic fit after Nustep.              Gait training:    Amb. From blue mat to Nustep with use of RW and focus on step through gait.  CGA on R to ensure R knee mechanism locks out before wt. Bearing.  Cuing to increase R LE step length while maintaining consistent BOS/ knee mechanism extension/ heel strike.  SBA/CGA for safety with gait.  PT adjusted/tightened fit several times during gait to Nustep.  Good gait pattern/ R knee mechanism control today.    Walking in //-bars with focus on upright posture/ R step length to ensure heel strike/ full knee extension prior to wt. Bearing.  Pt. More confident with gait pattern as tx. Progresses.  Forward/ lateral walking in //-bars with mirror feedback/ verbal cuing for posture correction 3x each.   No stairs today.  Walk in  hallway (105 feet) with SBA and use of RW with consistent step pattern.  Cuing to correct posture.  Pt. Walked from //-bars to car with use of RW.  Improved ability to get in to passenger side of SUV as compared to transferring from w/c.    PATIENT EDUCATION:  Education details: HEP/ gait and prosthetic training.  Person educated: Patient and Spouse Education method: Explanation, Demonstration, and Verbal cues Education comprehension: verbalized understanding, returned demonstration, and tactile cues required   HOME EXERCISE PROGRAM: Prone position hip stretches/ Standing marching at kitchen counter with w/c behind patient.      ASSESSMENT:  CLINICAL IMPRESSION: Pt. Presents to PT in w/c and brought prosthetic leg.  Pt. Highly motivated to become more independent with use of RW while walking short distances in gym.  B UE/ shoulder fatigue with heavy UE assist with use of RW.  Increase  walking endurance today as compared to previous weeks.  No LOB but pt. Cautious with initial steps after standing.   Pt. Will benefit from skilled PT services to improve standing tolerance/ gait independence and ADL.    OBJECTIVE IMPAIRMENTS Abnormal gait, decreased activity tolerance, decreased balance, decreased endurance, decreased mobility, difficulty walking, decreased ROM, decreased strength, decreased safety awareness, impaired flexibility, improper body mechanics, prosthetic dependency , and pain.   ACTIVITY LIMITATIONS carrying, lifting, standing, squatting, stairs, transfers, bed mobility, bathing, toileting, dressing, hygiene/grooming, and locomotion level  PARTICIPATION LIMITATIONS: cleaning, laundry, driving, shopping, community activity, and yard work  PERSONAL FACTORS Past/current experiences are also affecting patient's functional outcome.   REHAB POTENTIAL: Good  CLINICAL DECISION MAKING: Evolving/moderate complexity  EVALUATION COMPLEXITY: High   GOALS: Goals reviewed with patient Yes  LONG TERM GOALS: Target date: 05/24/22  Pt. Will increase FOTO to 54 to improve functional mobility.  Baseline: initial FOTO 46.  11/2: 50 Goal status: Partially met  2.  Pt. Able to tolerate standing 10 minutes while pulling up pants with mod. I safely while wearing prosthetic leg to improve daily activities.  Baseline:  limited standing tolerance Goal status: Not met  3.  Pt. Will ambulate 200 feet with use of SPC and mod. I to promote safety with household tasks/ walking into bathroom.   Baseline:  amb. With RW and min. A for safety.  2/27: walking in clinic with SBA/mod. I for 100 feet with RW and use of prosthetic leg  Goal status: Not met  4.  Pt. Will ascend 10 stairs with use of single forearm crutch/ handrail and step to pattern with mod. I safely.  Baseline:  see above Goal status: Not met    PLAN: PT FREQUENCY: 2x/week  PT DURATION: 8 weeks  PLANNED  INTERVENTIONS: Therapeutic exercises, Therapeutic activity, Neuromuscular re-education, Balance training, Gait training, Patient/Family education, Self Care, Joint mobilization, Prosthetic training, DME instructions, and Manual therapy  PLAN FOR NEXT SESSION:  Progress gait/ standing tolerance.  CHECK GOALS/ DISCUSS POC  Cammie Mcgee, PT, DPT # [phone] 12:27 PM,05/19/22

## 2022-05-24 ENCOUNTER — Ambulatory Visit: Payer: Medicare Other | Admitting: Physical Therapy

## 2022-05-24 DIAGNOSIS — R293 Abnormal posture: Secondary | ICD-10-CM

## 2022-05-24 DIAGNOSIS — R269 Unspecified abnormalities of gait and mobility: Secondary | ICD-10-CM

## 2022-05-24 DIAGNOSIS — Z89611 Acquired absence of right leg above knee: Secondary | ICD-10-CM | POA: Diagnosis not present

## 2022-05-24 DIAGNOSIS — M6281 Muscle weakness (generalized): Secondary | ICD-10-CM

## 2022-05-26 ENCOUNTER — Ambulatory Visit: Payer: Medicare Other | Admitting: Physical Therapy

## 2022-05-26 DIAGNOSIS — Z89611 Acquired absence of right leg above knee: Secondary | ICD-10-CM

## 2022-05-26 DIAGNOSIS — M6281 Muscle weakness (generalized): Secondary | ICD-10-CM

## 2022-05-26 DIAGNOSIS — R293 Abnormal posture: Secondary | ICD-10-CM

## 2022-05-26 DIAGNOSIS — R269 Unspecified abnormalities of gait and mobility: Secondary | ICD-10-CM

## 2022-05-29 NOTE — Therapy (Signed)
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   Hypertension    PONV (postoperative nausea and vomiting)    Seasonal allergies    Sleep apnea    does not uses CPAP machine anymore    Wheelchair dependent    able to self transfer   Past Surgical History:  Procedure Laterality Date   APPENDECTOMY     CATARACT EXTRACTION W/PHACO Right 02/28/2022   Procedure: CATARACT EXTRACTION PHACO AND INTRAOCULAR LENS PLACEMENT (IOC) RIGHT  4.82  00:30.9;  Surgeon: Nevada Crane, MD;  Location: [facility];  Service: Ophthalmology;  Laterality: Right;  sleep apnea   CATARACT EXTRACTION W/PHACO Left 03/14/2022   Procedure: CATARACT EXTRACTION PHACO AND INTRAOCULAR LENS PLACEMENT (IOC) LEFT;  Surgeon: Nevada Crane, MD;  Location: [facility];  Service: Ophthalmology;  Laterality: Left;  4.32 0:39.7   CESAREAN SECTION     CHOLECYSTECTOMY     DILATION AND CURETTAGE OF UTERUS     ESOPHAGOGASTRODUODENOSCOPY N/A 02/22/2019   Procedure: ESOPHAGOGASTRODUODENOSCOPY (EGD);  Surgeon: Toledo, Boykin Nearing, MD;  Location: [facility];  Service: Gastroenterology;  Laterality: N/A;   FLEXIBLE SIGMOIDOSCOPY N/A 02/22/2019   Procedure: FLEXIBLE SIGMOIDOSCOPY;  Surgeon: Toledo, Boykin Nearing, MD;  Location: [facility];  Service: Gastroenterology;  Laterality: N/A;   HARDWARE REMOVAL Right 11/12/2019   Procedure: Right ankle hardware removal;  Surgeon: Kennedy Bucker, MD;  Location: [facility];   Service: Orthopedics;  Laterality: Right;   HARDWARE REMOVAL Right 04/17/2020   Procedure: HARDWARE REMOVAL FROM RIGHT ANKLE AND RIGHT KNEE; IMPLANT OF CEMENT SPACERS IN RIGHT KNEE;  Surgeon: Donato Heinz, MD;  Location: [facility];  Service: Orthopedics;  Laterality: Right;   JOINT REPLACEMENT     LEG AMPUTATION ABOVE KNEE Right 12/02/2020   [facility]   ORIF ANKLE FRACTURE Right 03/14/2019   Procedure: OPEN REDUCTION INTERNAL FIXATION (ORIF) ANKLE FRACTURE, MEDIAL MALLEOLUS;  Surgeon: Kennedy Bucker, MD;  Location: [facility];  Service: Orthopedics;  Laterality: Right;   ORIF ANKLE FRACTURE Right 02/08/2019   Procedure: OPEN REDUCTION INTERNAL FIXATION (ORIF) ANKLE FRACTURE, post malleolus;  Surgeon: Kennedy Bucker, MD;  Location: [facility];  Service: Orthopedics;  Laterality: Right;   PARTIAL HYSTERECTOMY  1993   REPLACEMENT TOTAL KNEE Bilateral 2008   2009   SCAR DEBRIDEMENT OF TOTAL KNEE  01/2020   SYNDESMOSIS REPAIR Right 02/08/2019   Procedure: SYNDESMOSIS REPAIR;  Surgeon: Kennedy Bucker, MD;  Location: [facility];  Service: Orthopedics;  Laterality: Right;   TEE WITHOUT CARDIOVERSION N/A 02/26/2019   Procedure: TRANSESOPHAGEAL ECHOCARDIOGRAM (TEE);  Surgeon: Lamar Blinks, MD;  Location: [facility];  Service: Cardiovascular;  Laterality: N/A;   TOTAL KNEE REVISION WITH SCAR DEBRIDEMENT/PATELLA REVISION WITH POLY EXCHANGE Right 01/24/2020   Procedure: TOTAL KNEE REVISION WITH SCAR DEBRIDEMENT/PATELLA REVISION WITH POLY EXCHANGE;  Surgeon: Francesco Sor  P, MD;  Location: [facility];  Service: Orthopedics;  Laterality: Right;   TUMOR REMOVAL     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   Patient Active Problem List   Diagnosis Date Noted   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   Anxiety 04/17/2020   History of GI diverticular bleed 04/17/2020   Chest pain 04/17/2020   PAD (peripheral artery disease) (HCC) 04/05/2020   Ulcer of right leg (HCC) 04/05/2020   COPD (chronic obstructive  pulmonary disease) (HCC) 04/05/2020   GERD (gastroesophageal reflux disease) 04/05/2020   Abscess of right knee 01/22/2020   Pressure injury of skin 02/28/2019   SOB (shortness of breath)    Gastrointestinal hemorrhage    Sepsis (HCC)    Symptomatic anemia 02/20/2019   Trimalleolar fracture of ankle, closed, right, initial encounter 02/07/2019   Multiple lung nodules 03/31/2014   Extrinsic asthma 03/27/2014   OSA on CPAP 03/27/2014    PCP: Danella Penton, MD  REFERRING PROVIDER: Danella Penton, MD  REFERRING DIAG: Above knee amputation of right lower extremity  THERAPY DIAG:  S/P AKA (above knee amputation) unilateral, right (HCC)  Muscle weakness (generalized)  Gait difficulty  Abnormal posture  Rationale for Evaluation and Treatment Rehabilitation  ONSET DATE: 12/02/20  SUBJECTIVE:  EVALUATION  PERTINENT HISTORY: Patient Profile:   Jasmine Morrow is a 77 y.o. female Chief Complaint  Patient presents with  Visit Follow Up  Doing well.   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 Cardiac cath 10/2011 showed completely normal coronary arteries, however she did have mild left ventricular dysfunction with anterior wall motion abnormality. Left ventricular EF was 51%.  History of chickenpox  Hyperlipidemia  patient states she has never been diagnosed with this  Hypertension  Knee  joint replacement by other means  Macular degeneration  Major depressive disorder, recurrent, mild (CMS-HCC)  Medicare annual wellness visit, initial 08/09/2019  7/21  Multiple thyroid nodules  Normal coronary arteries 09/06/2013  Normal coronary anatomy by cardiac catheterization 10/12/11  Osteoarthritis  Ovarian cyst  PONV (postoperative nausea and vomiting)  nausea  Postoperative urinary retention 12/08/2020  Rash on lips 12/21/2020  Seasonal allergies  Sinusitis, unspecified  Sleep apnea  On CPAP.  Slow transit constipation 12/10/2020  Trimalleolar fracture of ankle, closed, right, initial encounter 02/07/2019  Complicated by staph infection 1/21, 4 weeks IV Ancef, Menz  Ulcer   Past Surgical History:  Procedure Laterality Date  SALPINGO OOPHORECTOMY Bilateral 1993  Right total knee arthroplasty 10/11/2006  Dr Ernest Pine  Left total knee arthroplasty 10/12/2007  Dr Ernest Pine  COLONOSCOPY 07/30/2012  internal hemorrhoids, diverticulosis  ANKLE ARTHODESIS W/ ARTHROSCOPY Right 02/08/2019  03/14/2019 second surgery  COLONOSCOPY 02/22/2019  Blood in entire colon/Diverticulosis - Presumed diverticular bleed. No repeat recommended per TKT.  EGD 02/22/2019  Gastritis/gastric ulcers/Hiatal hernia/Otherwise normal - no repeat recommended per TKT.  ORIF ANKLE FRACTURE Right 03/14/2019  Dr. Rosita Kea  REMOVAL HARDWARE ANKLE FOOT/TOES Right 11/12/2019  Dr. Rosita Kea  Right knee arthrotomy, irrigation and debridement of the right knee, polyethylene exchange 01/24/2020  Dr Ernest Pine  Removal of hardware (plates and screws) from the right ankle with irrigation, debridement, and placement of Stimulan antibiotic beads 04/17/2020  Dr Ernest Pine  Right knee arthrotomy, extensive irrigation debridement, removal of right total knee implants, and placement of antibiotic impregnated polymethylmethacrylate cement spacer 04/17/2020  Dr Ernest Pine  AMPUTATION LEG ABOVE KNEE AKA Right 12/02/2020  Procedure: AMPUTATION,  THIGH, THROUGH FEMUR, ANY LEVEL; Surgeon: Starr Lake, MD; Location: [facility]; Service: Orthopedics; Laterality: Right;  TRANSFER ADJACENT TISSUE LEG Right 12/02/2020  Procedure: ADJACENT TISSUE TRANSFER OR REARRANGEMENT, LEG; DEFECT 10 SQ CM OR LESS; Surgeon: Starr Lake, MD; Location: [facility]; Service: Orthopedics; Laterality: Right;  ASPIRATION/INJECTION MAJOR JOINT/BURSA KNEE Left 12/02/2020  Procedure: ARTHROCENTESIS, ASPIRATION AND/OR INJECTION, MAJOR JOINT OR BURSA, KNEE; WITHOUT ULTRASOUND GUIDANCE; Surgeon: Starr Lake, MD; Location: [facility]; Service: Orthopedics; Laterality: Left;  REMOVAL KNEE PROSTHESIS W/POSSIBLE PLACEMENT SPACER Left 01/18/2021  Procedure: REMOVAL OF PROSTHESIS, INCLUDING TOTAL KNEE PROSTHESIS, METHYLMETHACRYLATE WITH OR WITHOUT INSERTION OF SPACER, KNEE; Surgeon: Celine Mans, MD; Location: [facility]; Service: Orthopedics; Laterality: Left;  INSERTION NON-BIODEGRADABLE DRUG DELIVERY IMPLANT Left 01/18/2021  Procedure: INSERTION, KNEE, bioresorbable, biodegradable, NON-BIODEGRADABLE DRUG DELIVERY IMPLANT; Surgeon: Celine Mans, MD; Location: [facility]; Service: Orthopedics; Laterality: Left;  ABOVE KNEE LEG AMPUTATION  12/02/2020  APPENDECTOMY Est 1968  CESAREAN SECTION  CHOLECYSTECTOMY  FRACTURE SURGERY 1/21  HYSTERECTOMY partial  JOINT REPLACEMENT 2008 & 2009  TUBAL LIGATION 3/79   ALLERGIES: Allergies  Allergen Reactions  Singulair [Montelukast] Other (See Comments)  Gland swelling  Sulfa (Sulfonamide Antibiotics) Swelling  Oxycodone Dizziness and Other (See Comments)  Vancomycin Analogues Other (See Comments)  Ringing in ears  Avinza [Morphine] Itching  Darvocet A500 [Propoxyphene N-Acetaminophen] Nausea  Nickel Rash  Ultracet [Tramadol-Acetaminophen] Rash  Vicodin [Hydrocodone-Acetaminophen] Vomiting  Vioxx [Rofecoxib] Other (See Comments)  GI   CURRENT  MEDICATIONS: Current Outpatient Medications: acetaminophen (TYLENOL) 500 MG tablet, Take 1,000 mg by mouth 3 (three) times a day Twice daily per patient., PRN Not Currently Taking aspirin 81 MG EC tablet, Take 1 tablet (81 mg total) by mouth at bedtime, Taking calcium carbonate-vitamin D3 (CALTRATE 600+D) 600 mg-10 mcg (400 unit) tablet, Take 0.5 tablets by mouth 2 (two) times daily, Taking cetirizine (ZYRTEC) 10 mg capsule, Take 1 capsule (10 mg total) by mouth once daily, Taking escitalopram oxalate (LEXAPRO) 10 MG tablet, Take 1 tablet (10 mg total) by mouth once daily, Taking estradioL (ESTRACE) 0.01 % (0.1 mg/gram) vaginal cream, Place 2 g vaginally twice a week, Taking folic acid/multivit,iron,miner (MULTIVIT-IRON-MIN-FOLIC ACID ORAL), Take 1 tablet by mouth once daily, Taking gabapentin (NEURONTIN) 300 MG capsule, Take 1 capsule (300 mg total) by mouth 3 (three) times daily Phantom limb pain, Taking melatonin 3 mg tablet, Take 1 tablet (3 mg total) by mouth at bedtime, Taking multivitamin with iron (COMPLETE MULTIVITAMIN-MINERAL) tablet, Take 1 tablet by mouth, Taking multivitamin with minerals, EYE, (PRESERVISION AREDS 2) soft gel capsule, Take 1 capsule by mouth 2 (two) times daily, Taking nystatin (MYCOSTATIN) 100,000 unit/gram powder, Apply small amount topically twice daily for rash under breasts, PRN Not Currently Taking pantoprazole (PROTONIX) 40 MG DR tablet, Take 1 tablet (40 mg total) by mouth 2 (two) times daily. Please call to schedule an appt. Thanks!, Taking polyethylene glycol (MIRALAX) powder, Take 17 g by mouth once daily Mix in 4-8ounces of fluid prior to taking., Taking sennosides-docusate (SENOKOT-S) 8.6-50 mg  tablet, Take 1 tablet by mouth 2 (two) times daily For constipation, Taking lactose-reduced food (ENSURE PLUS ORAL), Take 237 mLs by mouth 2 (two) times daily With breakfast and dinner  HPI   CLINICAL SUMMARY:  Patient post right AKA and working with prosthesis.  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 Able to do housework, improving  PAIN:  Are you having pain? Yes: NPRS scale: 5/10 Pain location: L knee Pain description: aching Aggravating factors: walking Relieving factors: rest  PRECAUTIONS: None  WEIGHT BEARING RESTRICTIONS No  FALLS:  Has patient fallen in last 6 months? No  LIVING ENVIRONMENT: Lives with: lives with their spouse Lives in: House/apartment Stairs: No Has following equipment at home: Dan Humphreys - 4 wheeled and Wheelchair (manual)  OCCUPATION: retired  PLOF: Requires assistive device for independence  PATIENT GOALS  improve standing tolerance/ pull up pants/ ambulate with SPC safely.    OBJECTIVE:   PATIENT SURVEYS:  FOTO initial 46/ goal 89.   11/2: 50  COGNITION:  Overall cognitive status: Within functional limits for tasks assessed     SENSATION: WFL  POSTURE: rounded shoulders, forward head, flexed trunk , and weight shift left  PALPATION: No tenderness along R distal residual limb.  Discussed phantom limb sensation.    LOWER EXTREMITY ROM:  B LE AROM WFL except L knee extension secondary to articulating spacer.  Did not assess L/R hip extension at this time.    LOWER EXTREMITY MMT:  MMT Right eval Left eval  Hip flexion 4/5 4/5  Hip extension    Hip abduction 4+/5 4+/5  Hip adduction 4+/5 4+/5  Hip internal rotation    Hip external rotation    Knee flexion N/A 4+/5  Knee extension N/A 4+/5  Ankle dorsiflexion  5/5  Ankle plantarflexion    Ankle inversion    Ankle eversion     (Blank rows = not tested)  GAIT: Distance walked: in gym/ //-bars Assistive device utilized: Environmental consultant - 2 wheeled Level of assistance: CGA Comments: Cuing for posture correction with mirror feedback.  Flexed posture/ heavy UE assist.     TODAY'S TREATMENT:  05/24/2022  Subjective:  Pt. Arrived in w/c and ready to work on gait/ standing balance.  Pt. Has been wearing prosthetic leg  at home but has not been walking.  Pt. Understands that she needs to walk at home to improve carryover from PT.  Pt. Hasn't been back to church because of husband's recent TKA.    Objective:   There.ex.:  No charge   Nustep L5 10 min. L UE/B LE warm-up prior to gait.  Improvement in prosthetic fit after Nustep.              Gait training:    Amb. From blue mat to Nustep (around PT gym) with use of RW and focus on step through gait.  CGA on R to ensure R knee mechanism locks out before wt. Bearing.  Cuing to increase R LE step length while maintaining consistent BOS/ knee mechanism extension/ heel strike.  SBA/CGA for safety with gait.  PT adjusted/tightened fit several times during gait to Nustep.  PT assist with R knee mechanism control due to not locking out with wt. Bearing.     Ascend stairs with step to gait pattern and B UE assist (CGA from PT).  While descending stairs with step to gait pts. R knee mechanism was not locked while lifting stepping with R requiring max. PT assist to  control pt. To sit on stairs.  PT provided max. A to return to standing with B UE assist on handrails.  Pt. Not injured but upset/ fearful of R knee mechanism with stairs/ walking.     Walking in //-bars with focus on upright posture/ R step length to ensure heel strike/ full knee extension prior to wt. Bearing.  Improved R knee mechanism control while in //-bars and mod. Cuing to correct posture/ step length.     Pt. Did not walk to car today and used w/c.  Mod. I with squat-pivot transfer without use of prosthetic leg to get into passenger seat of car.     PATIENT EDUCATION:  Education details: HEP/ gait and prosthetic training.  Person educated: Patient and Spouse Education method: Explanation, Demonstration, and Verbal cues Education comprehension: verbalized understanding, returned demonstration, and tactile cues required   HOME EXERCISE PROGRAM: Prone position hip stretches/ Standing marching at  kitchen counter with w/c behind patient.      ASSESSMENT:  CLINICAL IMPRESSION: Pt. Presents to PT in w/c and brought prosthetic leg.  Pt. Highly motivated to become more independent with use of RW while walking short distances in gym.  B UE/ shoulder fatigue with heavy UE assist with use of RW.  1 episode of R knee mechanism giving out requiring max. Assist from PT to control pt. To sit on stairs and return to standing.  Pt. Limited by increase L knee pain/ weakness during eccentric quad control during step downs.   Pt. Will benefit from skilled PT services to improve standing tolerance/ gait independence and ADL.    OBJECTIVE IMPAIRMENTS Abnormal gait, decreased activity tolerance, decreased balance, decreased endurance, decreased mobility, difficulty walking, decreased ROM, decreased strength, decreased safety awareness, impaired flexibility, improper body mechanics, prosthetic dependency , and pain.   ACTIVITY LIMITATIONS carrying, lifting, standing, squatting, stairs, transfers, bed mobility, bathing, toileting, dressing, hygiene/grooming, and locomotion level  PARTICIPATION LIMITATIONS: cleaning, laundry, driving, shopping, community activity, and yard work  PERSONAL FACTORS Past/current experiences are also affecting patient's functional outcome.   REHAB POTENTIAL: Good  CLINICAL DECISION MAKING: Evolving/moderate complexity  EVALUATION COMPLEXITY: High   GOALS: Goals reviewed with patient Yes  LONG TERM GOALS: Target date: 05/24/22  Pt. Will increase FOTO to 54 to improve functional mobility.  Baseline: initial FOTO 46.  11/2: 50 Goal status: Partially met  2.  Pt. Able to tolerate standing 10 minutes while pulling up pants with mod. I safely while wearing prosthetic leg to improve daily activities.  Baseline:  limited standing tolerance Goal status: Not met  3.  Pt. Will ambulate 200 feet with use of SPC and mod. I to promote safety with household tasks/ walking into  bathroom.   Baseline:  amb. With RW and min. A for safety.  2/27: walking in clinic with SBA/mod. I for 100 feet with RW and use of prosthetic leg  Goal status: Not met  4.  Pt. Will ascend 10 stairs with use of single forearm crutch/ handrail and step to pattern with mod. I safely.  Baseline:  see above Goal status: Not met    PLAN: PT FREQUENCY: 2x/week  PT DURATION: 8 weeks  PLANNED INTERVENTIONS: Therapeutic exercises, Therapeutic activity, Neuromuscular re-education, Balance training, Gait training, Patient/Family education, Self Care, Joint mobilization, Prosthetic training, DME instructions, and Manual therapy  PLAN FOR NEXT SESSION:  Progress gait/ standing tolerance.  CHECK GOALS/ RECERT next tx.    Cammie Mcgee, PT, DPT # [phone] 6:15 PM,05/29/22

## 2022-05-31 ENCOUNTER — Encounter: Payer: Self-pay | Admitting: Physical Therapy

## 2022-05-31 ENCOUNTER — Ambulatory Visit: Payer: Medicare Other | Admitting: Physical Therapy

## 2022-05-31 DIAGNOSIS — M6281 Muscle weakness (generalized): Secondary | ICD-10-CM

## 2022-05-31 DIAGNOSIS — Z89611 Acquired absence of right leg above knee: Secondary | ICD-10-CM

## 2022-05-31 DIAGNOSIS — R269 Unspecified abnormalities of gait and mobility: Secondary | ICD-10-CM

## 2022-05-31 DIAGNOSIS — R293 Abnormal posture: Secondary | ICD-10-CM

## 2022-05-31 NOTE — Therapy (Signed)
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   Hypertension    PONV (postoperative nausea and vomiting)    Seasonal allergies    Sleep apnea    does not uses CPAP machine anymore    Wheelchair dependent    able to self transfer   Past Surgical History:  Procedure Laterality Date   APPENDECTOMY     CATARACT EXTRACTION W/PHACO Right 02/28/2022   Procedure: CATARACT EXTRACTION PHACO AND INTRAOCULAR LENS PLACEMENT (IOC) RIGHT  4.82  00:30.9;  Surgeon: Jasmine Crane, MD;  Location: [facility];  Service: Ophthalmology;  Laterality: Right;  sleep apnea   CATARACT EXTRACTION W/PHACO Left 03/14/2022   Procedure: CATARACT EXTRACTION PHACO AND INTRAOCULAR LENS PLACEMENT (IOC) LEFT;  Surgeon: Jasmine Crane, MD;  Location: [facility];  Service: Ophthalmology;  Laterality: Left;  4.32 0:39.7   CESAREAN SECTION     CHOLECYSTECTOMY     DILATION AND CURETTAGE OF UTERUS     ESOPHAGOGASTRODUODENOSCOPY N/A 02/22/2019   Procedure: ESOPHAGOGASTRODUODENOSCOPY (EGD);  Surgeon: Toledo, Boykin Nearing, MD;  Location: [facility];  Service: Gastroenterology;  Laterality: N/A;   FLEXIBLE SIGMOIDOSCOPY N/A 02/22/2019   Procedure: FLEXIBLE SIGMOIDOSCOPY;  Surgeon: Toledo, Boykin Nearing, MD;  Location: [facility];  Service: Gastroenterology;  Laterality: N/A;   HARDWARE REMOVAL Right 11/12/2019   Procedure: Right ankle hardware removal;  Surgeon: Jasmine Bucker, MD;  Location: [facility];   Service: Orthopedics;  Laterality: Right;   HARDWARE REMOVAL Right 04/17/2020   Procedure: HARDWARE REMOVAL FROM RIGHT ANKLE AND RIGHT KNEE; IMPLANT OF CEMENT SPACERS IN RIGHT KNEE;  Surgeon: Jasmine Heinz, MD;  Location: [facility];  Service: Orthopedics;  Laterality: Right;   JOINT REPLACEMENT     LEG AMPUTATION ABOVE KNEE Right 12/02/2020   [facility]   ORIF ANKLE FRACTURE Right 03/14/2019   Procedure: OPEN REDUCTION INTERNAL FIXATION (ORIF) ANKLE FRACTURE, MEDIAL MALLEOLUS;  Surgeon: Jasmine Bucker, MD;  Location: [facility];  Service: Orthopedics;  Laterality: Right;   ORIF ANKLE FRACTURE Right 02/08/2019   Procedure: OPEN REDUCTION INTERNAL FIXATION (ORIF) ANKLE FRACTURE, post malleolus;  Surgeon: Jasmine Bucker, MD;  Location: [facility];  Service: Orthopedics;  Laterality: Right;   PARTIAL HYSTERECTOMY  1993   REPLACEMENT TOTAL KNEE Bilateral 2008   2009   SCAR DEBRIDEMENT OF TOTAL KNEE  01/2020   SYNDESMOSIS REPAIR Right 02/08/2019   Procedure: SYNDESMOSIS REPAIR;  Surgeon: Jasmine Bucker, MD;  Location: [facility];  Service: Orthopedics;  Laterality: Right;   TEE WITHOUT CARDIOVERSION N/A 02/26/2019   Procedure: TRANSESOPHAGEAL ECHOCARDIOGRAM (TEE);  Surgeon: Jasmine Blinks, MD;  Location: [facility];  Service: Cardiovascular;  Laterality: N/A;   TOTAL KNEE REVISION WITH SCAR DEBRIDEMENT/PATELLA REVISION WITH POLY EXCHANGE Right 01/24/2020   Procedure: TOTAL KNEE REVISION WITH SCAR DEBRIDEMENT/PATELLA REVISION WITH POLY EXCHANGE;  Surgeon: Jasmine Morrow  P, MD;  Location: [facility];  Service: Orthopedics;  Laterality: Right;   TUMOR REMOVAL     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   Patient Active Problem List   Diagnosis Date Noted   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   Anxiety 04/17/2020   History of GI diverticular bleed 04/17/2020   Chest pain 04/17/2020   PAD (peripheral artery disease) (HCC) 04/05/2020   Ulcer of right leg (HCC) 04/05/2020   COPD (chronic obstructive  pulmonary disease) (HCC) 04/05/2020   GERD (gastroesophageal reflux disease) 04/05/2020   Abscess of right knee 01/22/2020   Pressure injury of skin 02/28/2019   SOB (shortness of breath)    Gastrointestinal hemorrhage    Sepsis (HCC)    Symptomatic anemia 02/20/2019   Trimalleolar fracture of ankle, closed, right, initial encounter 02/07/2019   Multiple lung nodules 03/31/2014   Extrinsic asthma 03/27/2014   OSA on CPAP 03/27/2014    PCP: Jasmine Penton, MD  REFERRING PROVIDER: Danella Penton, MD  REFERRING DIAG: Above knee amputation of right lower extremity  THERAPY DIAG:  S/P AKA (above knee amputation) unilateral, right (HCC)  Muscle weakness (generalized)  Gait difficulty  Abnormal posture  Rationale for Evaluation and Treatment Rehabilitation  ONSET DATE: 12/02/20  SUBJECTIVE:  EVALUATION  PERTINENT HISTORY: Patient Profile:   Jasmine Morrow is a 77 y.o. female Chief Complaint  Patient presents with  Visit Follow Up  Doing well.   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 Cardiac cath 10/2011 showed completely normal coronary arteries, however she did have mild left ventricular dysfunction with anterior wall motion abnormality. Left ventricular EF was 51%.  History of chickenpox  Hyperlipidemia  patient states she has never been diagnosed with this  Hypertension  Knee  joint replacement by other means  Macular degeneration  Major depressive disorder, recurrent, mild (CMS-HCC)  Medicare annual wellness visit, initial 08/09/2019  7/21  Multiple thyroid nodules  Normal coronary arteries 09/06/2013  Normal coronary anatomy by cardiac catheterization 10/12/11  Osteoarthritis  Ovarian cyst  PONV (postoperative nausea and vomiting)  nausea  Postoperative urinary retention 12/08/2020  Rash on lips 12/21/2020  Seasonal allergies  Sinusitis, unspecified  Sleep apnea  On CPAP.  Slow transit constipation 12/10/2020  Trimalleolar fracture of ankle, closed, right, initial encounter 02/07/2019  Complicated by staph infection 1/21, 4 weeks IV Ancef, Menz  Ulcer   Past Surgical History:  Procedure Laterality Date  SALPINGO OOPHORECTOMY Bilateral 1993  Right total knee arthroplasty 10/11/2006  Dr Ernest Pine  Left total knee arthroplasty 10/12/2007  Dr Ernest Pine  COLONOSCOPY 07/30/2012  internal hemorrhoids, diverticulosis  ANKLE ARTHODESIS W/ ARTHROSCOPY Right 02/08/2019  03/14/2019 second surgery  COLONOSCOPY 02/22/2019  Blood in entire colon/Diverticulosis - Presumed diverticular bleed. No repeat recommended per TKT.  EGD 02/22/2019  Gastritis/gastric ulcers/Hiatal hernia/Otherwise normal - no repeat recommended per TKT.  ORIF ANKLE FRACTURE Right 03/14/2019  Dr. Rosita Kea  REMOVAL HARDWARE ANKLE FOOT/TOES Right 11/12/2019  Dr. Rosita Kea  Right knee arthrotomy, irrigation and debridement of the right knee, polyethylene exchange 01/24/2020  Dr Ernest Pine  Removal of hardware (plates and screws) from the right ankle with irrigation, debridement, and placement of Stimulan antibiotic beads 04/17/2020  Dr Ernest Pine  Right knee arthrotomy, extensive irrigation debridement, removal of right total knee implants, and placement of antibiotic impregnated polymethylmethacrylate cement spacer 04/17/2020  Dr Ernest Pine  AMPUTATION LEG ABOVE KNEE AKA Right 12/02/2020  Procedure: AMPUTATION,  THIGH, THROUGH FEMUR, ANY LEVEL; Surgeon: Starr Lake, MD; Location: [facility]; Service: Orthopedics; Laterality: Right;  TRANSFER ADJACENT TISSUE LEG Right 12/02/2020  Procedure: ADJACENT TISSUE TRANSFER OR REARRANGEMENT, LEG; DEFECT 10 SQ CM OR LESS; Surgeon: Starr Lake, MD; Location: [facility]; Service: Orthopedics; Laterality: Right;  ASPIRATION/INJECTION MAJOR JOINT/BURSA KNEE Left 12/02/2020  Procedure: ARTHROCENTESIS, ASPIRATION AND/OR INJECTION, MAJOR JOINT OR BURSA, KNEE; WITHOUT ULTRASOUND GUIDANCE; Surgeon: Starr Lake, MD; Location: [facility]; Service: Orthopedics; Laterality: Left;  REMOVAL KNEE PROSTHESIS W/POSSIBLE PLACEMENT SPACER Left 01/18/2021  Procedure: REMOVAL OF PROSTHESIS, INCLUDING TOTAL KNEE PROSTHESIS, METHYLMETHACRYLATE WITH OR WITHOUT INSERTION OF SPACER, KNEE; Surgeon: Celine Mans, MD; Location: [facility]; Service: Orthopedics; Laterality: Left;  INSERTION NON-BIODEGRADABLE DRUG DELIVERY IMPLANT Left 01/18/2021  Procedure: INSERTION, KNEE, bioresorbable, biodegradable, NON-BIODEGRADABLE DRUG DELIVERY IMPLANT; Surgeon: Celine Mans, MD; Location: [facility]; Service: Orthopedics; Laterality: Left;  ABOVE KNEE LEG AMPUTATION  12/02/2020  APPENDECTOMY Est 1968  CESAREAN SECTION  CHOLECYSTECTOMY  FRACTURE SURGERY 1/21  HYSTERECTOMY partial  JOINT REPLACEMENT 2008 & 2009  TUBAL LIGATION 3/79   ALLERGIES: Allergies  Allergen Reactions  Singulair [Montelukast] Other (See Comments)  Gland swelling  Sulfa (Sulfonamide Antibiotics) Swelling  Oxycodone Dizziness and Other (See Comments)  Vancomycin Analogues Other (See Comments)  Ringing in ears  Avinza [Morphine] Itching  Darvocet A500 [Propoxyphene N-Acetaminophen] Nausea  Nickel Rash  Ultracet [Tramadol-Acetaminophen] Rash  Vicodin [Hydrocodone-Acetaminophen] Vomiting  Vioxx [Rofecoxib] Other (See Comments)  GI   CURRENT  MEDICATIONS: Current Outpatient Medications: acetaminophen (TYLENOL) 500 MG tablet, Take 1,000 mg by mouth 3 (three) times a day Twice daily per patient., PRN Not Currently Taking aspirin 81 MG EC tablet, Take 1 tablet (81 mg total) by mouth at bedtime, Taking calcium carbonate-vitamin D3 (CALTRATE 600+D) 600 mg-10 mcg (400 unit) tablet, Take 0.5 tablets by mouth 2 (two) times daily, Taking cetirizine (ZYRTEC) 10 mg capsule, Take 1 capsule (10 mg total) by mouth once daily, Taking escitalopram oxalate (LEXAPRO) 10 MG tablet, Take 1 tablet (10 mg total) by mouth once daily, Taking estradioL (ESTRACE) 0.01 % (0.1 mg/gram) vaginal cream, Place 2 g vaginally twice a week, Taking folic acid/multivit,iron,miner (MULTIVIT-IRON-MIN-FOLIC ACID ORAL), Take 1 tablet by mouth once daily, Taking gabapentin (NEURONTIN) 300 MG capsule, Take 1 capsule (300 mg total) by mouth 3 (three) times daily Phantom limb pain, Taking melatonin 3 mg tablet, Take 1 tablet (3 mg total) by mouth at bedtime, Taking multivitamin with iron (COMPLETE MULTIVITAMIN-MINERAL) tablet, Take 1 tablet by mouth, Taking multivitamin with minerals, EYE, (PRESERVISION AREDS 2) soft gel capsule, Take 1 capsule by mouth 2 (two) times daily, Taking nystatin (MYCOSTATIN) 100,000 unit/gram powder, Apply small amount topically twice daily for rash under breasts, PRN Not Currently Taking pantoprazole (PROTONIX) 40 MG DR tablet, Take 1 tablet (40 mg total) by mouth 2 (two) times daily. Please call to schedule an appt. Thanks!, Taking polyethylene glycol (MIRALAX) powder, Take 17 g by mouth once daily Mix in 4-8ounces of fluid prior to taking., Taking sennosides-docusate (SENOKOT-S) 8.6-50 mg  tablet, Take 1 tablet by mouth 2 (two) times daily For constipation, Taking lactose-reduced food (ENSURE PLUS ORAL), Take 237 mLs by mouth 2 (two) times daily With breakfast and dinner  HPI   CLINICAL SUMMARY:  Patient post right AKA and working with prosthesis.  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 Able to do housework, improving  PAIN:  Are you having pain? Yes: NPRS scale: 5/10 Pain location: L knee Pain description: aching Aggravating factors: walking Relieving factors: rest  PRECAUTIONS: None  WEIGHT BEARING RESTRICTIONS No  FALLS:  Has patient fallen in last 6 months? No  LIVING ENVIRONMENT: Lives with: lives with their spouse Lives in: House/apartment Stairs: No Has following equipment at home: Dan Humphreys - 4 wheeled and Wheelchair (manual)  OCCUPATION: retired  PLOF: Requires assistive device for independence  PATIENT GOALS  improve standing tolerance/ pull up pants/ ambulate with SPC safely.    OBJECTIVE:   PATIENT SURVEYS:  FOTO initial 46/ goal 81.   11/2: 50  COGNITION:  Overall cognitive status: Within functional limits for tasks assessed     SENSATION: WFL  POSTURE: rounded shoulders, forward head, flexed trunk , and weight shift left  PALPATION: No tenderness along R distal residual limb.  Discussed phantom limb sensation.    LOWER EXTREMITY ROM:  B LE AROM WFL except L knee extension secondary to articulating spacer.  Did not assess L/R hip extension at this time.    LOWER EXTREMITY MMT:  MMT Right eval Left eval  Hip flexion 4/5 4/5  Hip extension    Hip abduction 4+/5 4+/5  Hip adduction 4+/5 4+/5  Hip internal rotation    Hip external rotation    Knee flexion N/A 4+/5  Knee extension N/A 4+/5  Ankle dorsiflexion  5/5  Ankle plantarflexion    Ankle inversion    Ankle eversion     (Blank rows = not tested)  GAIT: Distance walked: in gym/ //-bars Assistive device utilized: Environmental consultant - 2 wheeled Level of assistance: CGA Comments: Cuing for posture correction with mirror feedback.  Flexed posture/ heavy UE assist.     TODAY'S TREATMENT:  05/26/2022  Subjective:  Pt. Has been wearing prosthetic leg at home but has not been walking.  Pt. Understands that she needs  to walk at home to improve carryover from PT.  Pt. Hasn't been back to church because of husband's recent TKA.    Objective:   There.ex.:  No charge   Nustep L5 10 min. L UE/B LE warm-up prior to gait.  Improvement in prosthetic fit after Nustep.              Gait training:    Amb. From blue mat to Nustep (around PT gym) with use of RW and focus on step through gait.  CGA on R to ensure R knee mechanism locks out before wt. Bearing.  Cuing to increase R LE step length while maintaining consistent BOS/ knee mechanism extension/ heel strike.  SBA/CGA for safety with gait.  PT adjusted/tightened fit several times during gait to Nustep.  PT assist with R knee mechanism control due to not locking out with wt. Bearing.     Ascend/ descend stairs with step to gait pattern and B UE assist (CGA from PT).  Moderate B shoulder muscle fatigue/ discomfort due to heavy UE use on handrails.  1x.    Walking in //-bars with focus on upright posture/ R step length to ensure heel strike/ full knee extension  prior to wt. Bearing.  Improved R knee mechanism control while in //-bars and mod. Cuing to correct posture/ step length.  Walking forward/ backwards/ lateral in //-bars with heavy UE assist.     Pt. Did not walk to car today and used w/c.  Mod. I with squat-pivot transfer without use of prosthetic leg to get into passenger seat of car.     PATIENT EDUCATION:  Education details: HEP/ gait and prosthetic training.  Person educated: Patient and Spouse Education method: Explanation, Demonstration, and Verbal cues Education comprehension: verbalized understanding, returned demonstration, and tactile cues required   HOME EXERCISE PROGRAM: Prone position hip stretches/ Standing marching at kitchen counter with w/c behind patient.      ASSESSMENT:  CLINICAL IMPRESSION: Pt. Presents to PT in w/c and brought prosthetic leg.  Pt. Highly motivated to become more independent with use of RW while walking short  distances in gym.  B UE/ shoulder fatigue with heavy UE assist with use of RW.  Improved R knee mechanism control at stairs and during gait in clinic as compared to last tx. Session.  Pts. Prosthetic fit determines pts. ability to ambulate/ complete stairs with decrease assistance.  Pt. Limited by increase L knee pain/ weakness during eccentric quad control during step downs.   Pt. Will benefit from skilled PT services to improve standing tolerance/ gait independence and ADL.    OBJECTIVE IMPAIRMENTS Abnormal gait, decreased activity tolerance, decreased balance, decreased endurance, decreased mobility, difficulty walking, decreased ROM, decreased strength, decreased safety awareness, impaired flexibility, improper body mechanics, prosthetic dependency , and pain.   ACTIVITY LIMITATIONS carrying, lifting, standing, squatting, stairs, transfers, bed mobility, bathing, toileting, dressing, hygiene/grooming, and locomotion level  PARTICIPATION LIMITATIONS: cleaning, laundry, driving, shopping, community activity, and yard work  PERSONAL FACTORS Past/current experiences are also affecting patient's functional outcome.   REHAB POTENTIAL: Good  CLINICAL DECISION MAKING: Evolving/moderate complexity  EVALUATION COMPLEXITY: High   GOALS: Goals reviewed with patient Yes  LONG TERM GOALS: Target date: 07/07/22  Pt. Will increase FOTO to 54 to improve functional mobility.  Baseline: initial FOTO 46.  11/2: 50.  4/25: 60  Goal status: Goal met  2.  Pt. Able to tolerate standing 10 minutes while pulling up pants with mod. I safely while wearing prosthetic leg to improve daily activities.  Baseline:  limited standing tolerance Goal status: Partially met  3.  Pt. Will ambulate 200 feet with use of SPC and mod. I to promote safety with household tasks/ walking into bathroom.   Baseline:  amb. With RW and min. A for safety.  2/27: walking in clinic with SBA/mod. I for 100 feet with RW and use of  prosthetic leg  Goal status: Not met  4.  Pt. Will ascend 10 stairs with use of single forearm crutch/ handrail and step to pattern with mod. I safely.  Baseline:  see above Goal status: Not met    PLAN: PT FREQUENCY: 2x/week  PT DURATION: 6 weeks  PLANNED INTERVENTIONS: Therapeutic exercises, Therapeutic activity, Neuromuscular re-education, Balance training, Gait training, Patient/Family education, Self Care, Joint mobilization, Prosthetic training, DME instructions, and Manual therapy  PLAN FOR NEXT SESSION:  Progress gait/ standing tolerance.    Cammie Mcgee, PT, DPT # [id number] 12:12 PM,05/31/22

## 2022-05-31 NOTE — Therapy (Signed)
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   Hypertension    PONV (postoperative nausea and vomiting)    Seasonal allergies    Sleep apnea    does not uses CPAP machine anymore    Wheelchair dependent    able to self transfer   Past Surgical History:  Procedure Laterality Date   APPENDECTOMY     CATARACT EXTRACTION W/PHACO Right 02/28/2022   Procedure: CATARACT EXTRACTION PHACO AND INTRAOCULAR LENS PLACEMENT (IOC) RIGHT  4.82  00:30.9;  Surgeon: Nevada Crane, MD;  Location: [facility];  Service: Ophthalmology;  Laterality: Right;  sleep apnea   CATARACT EXTRACTION W/PHACO Left 03/14/2022   Procedure: CATARACT EXTRACTION PHACO AND INTRAOCULAR LENS PLACEMENT (IOC) LEFT;  Surgeon: Nevada Crane, MD;  Location: [facility];  Service: Ophthalmology;  Laterality: Left;  4.32 0:39.7   CESAREAN SECTION     CHOLECYSTECTOMY     DILATION AND CURETTAGE OF UTERUS     ESOPHAGOGASTRODUODENOSCOPY N/A 02/22/2019   Procedure: ESOPHAGOGASTRODUODENOSCOPY (EGD);  Surgeon: Toledo, Boykin Nearing, MD;  Location: [facility];  Service: Gastroenterology;  Laterality: N/A;   FLEXIBLE SIGMOIDOSCOPY N/A 02/22/2019   Procedure: FLEXIBLE SIGMOIDOSCOPY;  Surgeon: Toledo, Boykin Nearing, MD;  Location: [facility];  Service: Gastroenterology;  Laterality: N/A;   HARDWARE REMOVAL Right 11/12/2019   Procedure: Right ankle hardware removal;  Surgeon: Kennedy Bucker, MD;  Location: [facility];   Service: Orthopedics;  Laterality: Right;   HARDWARE REMOVAL Right 04/17/2020   Procedure: HARDWARE REMOVAL FROM RIGHT ANKLE AND RIGHT KNEE; IMPLANT OF CEMENT SPACERS IN RIGHT KNEE;  Surgeon: Donato Heinz, MD;  Location: [facility];  Service: Orthopedics;  Laterality: Right;   JOINT REPLACEMENT     LEG AMPUTATION ABOVE KNEE Right 12/02/2020   [facility]   ORIF ANKLE FRACTURE Right 03/14/2019   Procedure: OPEN REDUCTION INTERNAL FIXATION (ORIF) ANKLE FRACTURE, MEDIAL MALLEOLUS;  Surgeon: Kennedy Bucker, MD;  Location: [facility];  Service: Orthopedics;  Laterality: Right;   ORIF ANKLE FRACTURE Right 02/08/2019   Procedure: OPEN REDUCTION INTERNAL FIXATION (ORIF) ANKLE FRACTURE, post malleolus;  Surgeon: Kennedy Bucker, MD;  Location: [facility];  Service: Orthopedics;  Laterality: Right;   PARTIAL HYSTERECTOMY  1993   REPLACEMENT TOTAL KNEE Bilateral 2008   2009   SCAR DEBRIDEMENT OF TOTAL KNEE  01/2020   SYNDESMOSIS REPAIR Right 02/08/2019   Procedure: SYNDESMOSIS REPAIR;  Surgeon: Kennedy Bucker, MD;  Location: [facility];  Service: Orthopedics;  Laterality: Right;   TEE WITHOUT CARDIOVERSION N/A 02/26/2019   Procedure: TRANSESOPHAGEAL ECHOCARDIOGRAM (TEE);  Surgeon: Lamar Blinks, MD;  Location: [facility];  Service: Cardiovascular;  Laterality: N/A;   TOTAL KNEE REVISION WITH SCAR DEBRIDEMENT/PATELLA REVISION WITH POLY EXCHANGE Right 01/24/2020   Procedure: TOTAL KNEE REVISION WITH SCAR DEBRIDEMENT/PATELLA REVISION WITH POLY EXCHANGE;  Surgeon: Francesco Sor  P, MD;  Location: [facility];  Service: Orthopedics;  Laterality: Right;   TUMOR REMOVAL     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   Patient Active Problem List   Diagnosis Date Noted   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   Anxiety 04/17/2020   History of GI diverticular bleed 04/17/2020   Chest pain 04/17/2020   PAD (peripheral artery disease) (HCC) 04/05/2020   Ulcer of right leg (HCC) 04/05/2020   COPD (chronic obstructive  pulmonary disease) (HCC) 04/05/2020   GERD (gastroesophageal reflux disease) 04/05/2020   Abscess of right knee 01/22/2020   Pressure injury of skin 02/28/2019   SOB (shortness of breath)    Gastrointestinal hemorrhage    Sepsis (HCC)    Symptomatic anemia 02/20/2019   Trimalleolar fracture of ankle, closed, right, initial encounter 02/07/2019   Multiple lung nodules 03/31/2014   Extrinsic asthma 03/27/2014   OSA on CPAP 03/27/2014    PCP: Danella Penton, MD  REFERRING PROVIDER: Danella Penton, MD  REFERRING DIAG: Above knee amputation of right lower extremity  THERAPY DIAG:  S/P AKA (above knee amputation) unilateral, right (HCC)  Muscle weakness (generalized)  Gait difficulty  Abnormal posture  Rationale for Evaluation and Treatment Rehabilitation  ONSET DATE: 12/02/20  SUBJECTIVE:  EVALUATION  PERTINENT HISTORY: Patient Profile:   Jasmine Morrow is a 77 y.o. female Chief Complaint  Patient presents with  Visit Follow Up  Doing well.   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 Cardiac cath 10/2011 showed completely normal coronary arteries, however she did have mild left ventricular dysfunction with anterior wall motion abnormality. Left ventricular EF was 51%.  History of chickenpox  Hyperlipidemia  patient states she has never been diagnosed with this  Hypertension  Knee  joint replacement by other means  Macular degeneration  Major depressive disorder, recurrent, mild (CMS-HCC)  Medicare annual wellness visit, initial 08/09/2019  7/21  Multiple thyroid nodules  Normal coronary arteries 09/06/2013  Normal coronary anatomy by cardiac catheterization 10/12/11  Osteoarthritis  Ovarian cyst  PONV (postoperative nausea and vomiting)  nausea  Postoperative urinary retention 12/08/2020  Rash on lips 12/21/2020  Seasonal allergies  Sinusitis, unspecified  Sleep apnea  On CPAP.  Slow transit constipation 12/10/2020  Trimalleolar fracture of ankle, closed, right, initial encounter 02/07/2019  Complicated by staph infection 1/21, 4 weeks IV Ancef, Menz  Ulcer   Past Surgical History:  Procedure Laterality Date  SALPINGO OOPHORECTOMY Bilateral 1993  Right total knee arthroplasty 10/11/2006  Dr Ernest Pine  Left total knee arthroplasty 10/12/2007  Dr Ernest Pine  COLONOSCOPY 07/30/2012  internal hemorrhoids, diverticulosis  ANKLE ARTHODESIS W/ ARTHROSCOPY Right 02/08/2019  03/14/2019 second surgery  COLONOSCOPY 02/22/2019  Blood in entire colon/Diverticulosis - Presumed diverticular bleed. No repeat recommended per TKT.  EGD 02/22/2019  Gastritis/gastric ulcers/Hiatal hernia/Otherwise normal - no repeat recommended per TKT.  ORIF ANKLE FRACTURE Right 03/14/2019  Dr. Rosita Kea  REMOVAL HARDWARE ANKLE FOOT/TOES Right 11/12/2019  Dr. Rosita Kea  Right knee arthrotomy, irrigation and debridement of the right knee, polyethylene exchange 01/24/2020  Dr Ernest Pine  Removal of hardware (plates and screws) from the right ankle with irrigation, debridement, and placement of Stimulan antibiotic beads 04/17/2020  Dr Ernest Pine  Right knee arthrotomy, extensive irrigation debridement, removal of right total knee implants, and placement of antibiotic impregnated polymethylmethacrylate cement spacer 04/17/2020  Dr Ernest Pine  AMPUTATION LEG ABOVE KNEE AKA Right 12/02/2020  Procedure: AMPUTATION,  THIGH, THROUGH FEMUR, ANY LEVEL; Surgeon: Starr Lake, MD; Location: [facility]; Service: Orthopedics; Laterality: Right;  TRANSFER ADJACENT TISSUE LEG Right 12/02/2020  Procedure: ADJACENT TISSUE TRANSFER OR REARRANGEMENT, LEG; DEFECT 10 SQ CM OR LESS; Surgeon: Starr Lake, MD; Location: [facility]; Service: Orthopedics; Laterality: Right;  ASPIRATION/INJECTION MAJOR JOINT/BURSA KNEE Left 12/02/2020  Procedure: ARTHROCENTESIS, ASPIRATION AND/OR INJECTION, MAJOR JOINT OR BURSA, KNEE; WITHOUT ULTRASOUND GUIDANCE; Surgeon: Starr Lake, MD; Location: [facility]; Service: Orthopedics; Laterality: Left;  REMOVAL KNEE PROSTHESIS W/POSSIBLE PLACEMENT SPACER Left 01/18/2021  Procedure: REMOVAL OF PROSTHESIS, INCLUDING TOTAL KNEE PROSTHESIS, METHYLMETHACRYLATE WITH OR WITHOUT INSERTION OF SPACER, KNEE; Surgeon: Celine Mans, MD; Location: [facility]; Service: Orthopedics; Laterality: Left;  INSERTION NON-BIODEGRADABLE DRUG DELIVERY IMPLANT Left 01/18/2021  Procedure: INSERTION, KNEE, bioresorbable, biodegradable, NON-BIODEGRADABLE DRUG DELIVERY IMPLANT; Surgeon: Celine Mans, MD; Location: [facility]; Service: Orthopedics; Laterality: Left;  ABOVE KNEE LEG AMPUTATION  12/02/2020  APPENDECTOMY Est 1968  CESAREAN SECTION  CHOLECYSTECTOMY  FRACTURE SURGERY 1/21  HYSTERECTOMY partial  JOINT REPLACEMENT 2008 & 2009  TUBAL LIGATION 3/79   ALLERGIES: Allergies  Allergen Reactions  Singulair [Montelukast] Other (See Comments)  Gland swelling  Sulfa (Sulfonamide Antibiotics) Swelling  Oxycodone Dizziness and Other (See Comments)  Vancomycin Analogues Other (See Comments)  Ringing in ears  Avinza [Morphine] Itching  Darvocet A500 [Propoxyphene N-Acetaminophen] Nausea  Nickel Rash  Ultracet [Tramadol-Acetaminophen] Rash  Vicodin [Hydrocodone-Acetaminophen] Vomiting  Vioxx [Rofecoxib] Other (See Comments)  GI   CURRENT  MEDICATIONS: Current Outpatient Medications: acetaminophen (TYLENOL) 500 MG tablet, Take 1,000 mg by mouth 3 (three) times a day Twice daily per patient., PRN Not Currently Taking aspirin 81 MG EC tablet, Take 1 tablet (81 mg total) by mouth at bedtime, Taking calcium carbonate-vitamin D3 (CALTRATE 600+D) 600 mg-10 mcg (400 unit) tablet, Take 0.5 tablets by mouth 2 (two) times daily, Taking cetirizine (ZYRTEC) 10 mg capsule, Take 1 capsule (10 mg total) by mouth once daily, Taking escitalopram oxalate (LEXAPRO) 10 MG tablet, Take 1 tablet (10 mg total) by mouth once daily, Taking estradioL (ESTRACE) 0.01 % (0.1 mg/gram) vaginal cream, Place 2 g vaginally twice a week, Taking folic acid/multivit,iron,miner (MULTIVIT-IRON-MIN-FOLIC ACID ORAL), Take 1 tablet by mouth once daily, Taking gabapentin (NEURONTIN) 300 MG capsule, Take 1 capsule (300 mg total) by mouth 3 (three) times daily Phantom limb pain, Taking melatonin 3 mg tablet, Take 1 tablet (3 mg total) by mouth at bedtime, Taking multivitamin with iron (COMPLETE MULTIVITAMIN-MINERAL) tablet, Take 1 tablet by mouth, Taking multivitamin with minerals, EYE, (PRESERVISION AREDS 2) soft gel capsule, Take 1 capsule by mouth 2 (two) times daily, Taking nystatin (MYCOSTATIN) 100,000 unit/gram powder, Apply small amount topically twice daily for rash under breasts, PRN Not Currently Taking pantoprazole (PROTONIX) 40 MG DR tablet, Take 1 tablet (40 mg total) by mouth 2 (two) times daily. Please call to schedule an appt. Thanks!, Taking polyethylene glycol (MIRALAX) powder, Take 17 g by mouth once daily Mix in 4-8ounces of fluid prior to taking., Taking sennosides-docusate (SENOKOT-S) 8.6-50 mg  tablet, Take 1 tablet by mouth 2 (two) times daily For constipation, Taking lactose-reduced food (ENSURE PLUS ORAL), Take 237 mLs by mouth 2 (two) times daily With breakfast and dinner  HPI   CLINICAL SUMMARY:  Patient post right AKA and working with prosthesis.  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 Able to do housework, improving  PAIN:  Are you having pain? Yes: NPRS scale: 5/10 Pain location: L knee Pain description: aching Aggravating factors: walking Relieving factors: rest  PRECAUTIONS: None  WEIGHT BEARING RESTRICTIONS No  FALLS:  Has patient fallen in last 6 months? No  LIVING ENVIRONMENT: Lives with: lives with their spouse Lives in: House/apartment Stairs: No Has following equipment at home: Dan Humphreys - 4 wheeled and Wheelchair (manual)  OCCUPATION: retired  PLOF: Requires assistive device for independence  PATIENT GOALS  improve standing tolerance/ pull up pants/ ambulate with SPC safely.    OBJECTIVE:   PATIENT SURVEYS:  FOTO initial 46/ goal 31.   11/2: 50  COGNITION:  Overall cognitive status: Within functional limits for tasks assessed     SENSATION: WFL  POSTURE: rounded shoulders, forward head, flexed trunk , and weight shift left  PALPATION: No tenderness along R distal residual limb.  Discussed phantom limb sensation.    LOWER EXTREMITY ROM:  B LE AROM WFL except L knee extension secondary to articulating spacer.  Did not assess L/R hip extension at this time.    LOWER EXTREMITY MMT:  MMT Right eval Left eval  Hip flexion 4/5 4/5  Hip extension    Hip abduction 4+/5 4+/5  Hip adduction 4+/5 4+/5  Hip internal rotation    Hip external rotation    Knee flexion N/A 4+/5  Knee extension N/A 4+/5  Ankle dorsiflexion  5/5  Ankle plantarflexion    Ankle inversion    Ankle eversion     (Blank rows = not tested)  GAIT: Distance walked: in gym/ //-bars Assistive device utilized: Environmental consultant - 2 wheeled Level of assistance: CGA Comments: Cuing for posture correction with mirror feedback.  Flexed posture/ heavy UE assist.     TODAY'S TREATMENT:  05/31/2022  Subjective:  Pt. Returned to church this past Sunday with friend and use of w/c.  Pt. Has been wearing prosthetic  leg at home but has not been walking.  Pt. Understands that she needs to walk at home to improve carryover from PT.    Objective:   There.ex.:  No charge   Nustep L5 10 min. L UE/B LE warm-up prior to gait.  Improvement in prosthetic fit after Nustep.              Gait training:    Amb. From blue mat to Nustep (around PT gym) with use of RW and focus on step through gait.  SBA/CGA on R to ensure R knee mechanism locks out before wt. Bearing.  Cuing to increase R LE step length while maintaining consistent BOS/ knee mechanism extension/ heel strike.  SBA/CGA for safety with gait.  PT adjusted/tightened fit several times during gait to Nustep.  No PT assist with knee mechanism today due to improved fit of leg.     Ascend/ descend stairs with step to gait pattern and B UE assist (CGA from PT).  Moderate B shoulder muscle fatigue/ discomfort due to heavy UE use on handrails.  1x.    Sit to stand from chair in front of //-bars (mirror).  Walking in clinic with RW and agility  ladder working on consistent step length/ pattern.  No knee mechanism issues.    Pt. Able to walk from gym to car today with no standing rest breaks and consistent pattern.  No LOB.    PATIENT EDUCATION:  Education details: HEP/ gait and prosthetic training.  Person educated: Patient and Spouse Education method: Explanation, Demonstration, and Verbal cues Education comprehension: verbalized understanding, returned demonstration, and tactile cues required   HOME EXERCISE PROGRAM: Prone position hip stretches/ Standing marching at kitchen counter with w/c behind patient.      ASSESSMENT:  CLINICAL IMPRESSION: Pt. Presents to PT in w/c and brought prosthetic leg.  Pt. Highly motivated to become more independent with use of RW while walking short distances in gym.  B UE/ shoulder fatigue with heavy UE assist with use of RW.  Improved R knee mechanism control at stairs and during gait in clinic as compared to last tx.  Session.  Pts. Prosthetic fit determines pts. ability to ambulate/ complete stairs with decrease assistance.  Pt. Limited by increase L knee pain/ weakness during eccentric quad control during step downs.   Pt. Will benefit from skilled PT services to improve standing tolerance/ gait independence and ADL.    OBJECTIVE IMPAIRMENTS Abnormal gait, decreased activity tolerance, decreased balance, decreased endurance, decreased mobility, difficulty walking, decreased ROM, decreased strength, decreased safety awareness, impaired flexibility, improper body mechanics, prosthetic dependency , and pain.   ACTIVITY LIMITATIONS carrying, lifting, standing, squatting, stairs, transfers, bed mobility, bathing, toileting, dressing, hygiene/grooming, and locomotion level  PARTICIPATION LIMITATIONS: cleaning, laundry, driving, shopping, community activity, and yard work  PERSONAL FACTORS Past/current experiences are also affecting patient's functional outcome.   REHAB POTENTIAL: Good  CLINICAL DECISION MAKING: Evolving/moderate complexity  EVALUATION COMPLEXITY: High   GOALS: Goals reviewed with patient Yes  LONG TERM GOALS: Target date: 07/07/22  Pt. Will increase FOTO to 54 to improve functional mobility.  Baseline: initial FOTO 46.  11/2: 50.  4/25: 60  Goal status: Goal met  2.  Pt. Able to tolerate standing 10 minutes while pulling up pants with mod. I safely while wearing prosthetic leg to improve daily activities.  Baseline:  limited standing tolerance Goal status: Partially met  3.  Pt. Will ambulate 200 feet with use of SPC and mod. I to promote safety with household tasks/ walking into bathroom.   Baseline:  amb. With RW and min. A for safety.  2/27: walking in clinic with SBA/mod. I for 100 feet with RW and use of prosthetic leg  Goal status: Not met  4.  Pt. Will ascend 10 stairs with use of single forearm crutch/ handrail and step to pattern with mod. I safely.  Baseline:  see  above Goal status: Not met    PLAN: PT FREQUENCY: 2x/week  PT DURATION: 6 weeks  PLANNED INTERVENTIONS: Therapeutic exercises, Therapeutic activity, Neuromuscular re-education, Balance training, Gait training, Patient/Family education, Self Care, Joint mobilization, Prosthetic training, DME instructions, and Manual therapy  PLAN FOR NEXT SESSION:  Progress gait/ standing tolerance.    Cammie Mcgee, PT, DPT # [phone] 8:18 PM,05/31/22

## 2022-06-02 ENCOUNTER — Ambulatory Visit: Payer: Medicare Other | Attending: Internal Medicine | Admitting: Physical Therapy

## 2022-06-02 DIAGNOSIS — R269 Unspecified abnormalities of gait and mobility: Secondary | ICD-10-CM

## 2022-06-02 DIAGNOSIS — M6281 Muscle weakness (generalized): Secondary | ICD-10-CM | POA: Insufficient documentation

## 2022-06-02 DIAGNOSIS — Z89611 Acquired absence of right leg above knee: Secondary | ICD-10-CM

## 2022-06-02 DIAGNOSIS — R293 Abnormal posture: Secondary | ICD-10-CM

## 2022-06-04 NOTE — Therapy (Signed)
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   Hypertension    PONV (postoperative nausea and vomiting)    Seasonal allergies    Sleep apnea    does not uses CPAP machine anymore    Wheelchair dependent    able to self transfer   Past Surgical History:  Procedure Laterality Date   APPENDECTOMY     CATARACT EXTRACTION W/PHACO Right 02/28/2022   Procedure: CATARACT EXTRACTION PHACO AND INTRAOCULAR LENS PLACEMENT (IOC) RIGHT  4.82  00:30.9;  Surgeon: Nevada Crane, MD;  Location: [facility];  Service: Ophthalmology;  Laterality: Right;  sleep apnea   CATARACT EXTRACTION W/PHACO Left 03/14/2022   Procedure: CATARACT EXTRACTION PHACO AND INTRAOCULAR LENS PLACEMENT (IOC) LEFT;  Surgeon: Nevada Crane, MD;  Location: [facility];  Service: Ophthalmology;  Laterality: Left;  4.32 0:39.7   CESAREAN SECTION     CHOLECYSTECTOMY     DILATION AND CURETTAGE OF UTERUS     ESOPHAGOGASTRODUODENOSCOPY N/A 02/22/2019   Procedure: ESOPHAGOGASTRODUODENOSCOPY (EGD);  Surgeon: Toledo, Boykin Nearing, MD;  Location: [facility];  Service: Gastroenterology;  Laterality: N/A;   FLEXIBLE SIGMOIDOSCOPY N/A 02/22/2019   Procedure: FLEXIBLE SIGMOIDOSCOPY;  Surgeon: Toledo, Boykin Nearing, MD;  Location: [facility];  Service: Gastroenterology;  Laterality: N/A;   HARDWARE REMOVAL Right 11/12/2019   Procedure: Right ankle hardware removal;  Surgeon: Kennedy Bucker, MD;  Location: [facility];   Service: Orthopedics;  Laterality: Right;   HARDWARE REMOVAL Right 04/17/2020   Procedure: HARDWARE REMOVAL FROM RIGHT ANKLE AND RIGHT KNEE; IMPLANT OF CEMENT SPACERS IN RIGHT KNEE;  Surgeon: Donato Heinz, MD;  Location: [facility];  Service: Orthopedics;  Laterality: Right;   JOINT REPLACEMENT     LEG AMPUTATION ABOVE KNEE Right 12/02/2020   [facility]   ORIF ANKLE FRACTURE Right 03/14/2019   Procedure: OPEN REDUCTION INTERNAL FIXATION (ORIF) ANKLE FRACTURE, MEDIAL MALLEOLUS;  Surgeon: Kennedy Bucker, MD;  Location: [facility];  Service: Orthopedics;  Laterality: Right;   ORIF ANKLE FRACTURE Right 02/08/2019   Procedure: OPEN REDUCTION INTERNAL FIXATION (ORIF) ANKLE FRACTURE, post malleolus;  Surgeon: Kennedy Bucker, MD;  Location: [facility];  Service: Orthopedics;  Laterality: Right;   PARTIAL HYSTERECTOMY  1993   REPLACEMENT TOTAL KNEE Bilateral 2008   2009   SCAR DEBRIDEMENT OF TOTAL KNEE  01/2020   SYNDESMOSIS REPAIR Right 02/08/2019   Procedure: SYNDESMOSIS REPAIR;  Surgeon: Kennedy Bucker, MD;  Location: [facility];  Service: Orthopedics;  Laterality: Right;   TEE WITHOUT CARDIOVERSION N/A 02/26/2019   Procedure: TRANSESOPHAGEAL ECHOCARDIOGRAM (TEE);  Surgeon: Lamar Blinks, MD;  Location: [facility];  Service: Cardiovascular;  Laterality: N/A;   TOTAL KNEE REVISION WITH SCAR DEBRIDEMENT/PATELLA REVISION WITH POLY EXCHANGE Right 01/24/2020   Procedure: TOTAL KNEE REVISION WITH SCAR DEBRIDEMENT/PATELLA REVISION WITH POLY EXCHANGE;  Surgeon: Francesco Sor  P, MD;  Location: [facility];  Service: Orthopedics;  Laterality: Right;   TUMOR REMOVAL     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   Patient Active Problem List   Diagnosis Date Noted   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   Anxiety 04/17/2020   History of GI diverticular bleed 04/17/2020   Chest pain 04/17/2020   PAD (peripheral artery disease) (HCC) 04/05/2020   Ulcer of right leg (HCC) 04/05/2020   COPD (chronic obstructive  pulmonary disease) (HCC) 04/05/2020   GERD (gastroesophageal reflux disease) 04/05/2020   Abscess of right knee 01/22/2020   Pressure injury of skin 02/28/2019   SOB (shortness of breath)    Gastrointestinal hemorrhage    Sepsis (HCC)    Symptomatic anemia 02/20/2019   Trimalleolar fracture of ankle, closed, right, initial encounter 02/07/2019   Multiple lung nodules 03/31/2014   Extrinsic asthma 03/27/2014   OSA on CPAP 03/27/2014    PCP: Danella Penton, MD  REFERRING PROVIDER: Danella Penton, MD  REFERRING DIAG: Above knee amputation of right lower extremity  THERAPY DIAG:  S/P AKA (above knee amputation) unilateral, right (HCC)  Muscle weakness (generalized)  Gait difficulty  Abnormal posture  Rationale for Evaluation and Treatment Rehabilitation  ONSET DATE: 12/02/20  SUBJECTIVE:  EVALUATION  PERTINENT HISTORY: Patient Profile:   Jasmine Morrow is a 77 y.o. female Chief Complaint  Patient presents with  Visit Follow Up  Doing well.   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 Cardiac cath 10/2011 showed completely normal coronary arteries, however she did have mild left ventricular dysfunction with anterior wall motion abnormality. Left ventricular EF was 51%.  History of chickenpox  Hyperlipidemia  patient states she has never been diagnosed with this  Hypertension  Knee  joint replacement by other means  Macular degeneration  Major depressive disorder, recurrent, mild (CMS-HCC)  Medicare annual wellness visit, initial 08/09/2019  7/21  Multiple thyroid nodules  Normal coronary arteries 09/06/2013  Normal coronary anatomy by cardiac catheterization 10/12/11  Osteoarthritis  Ovarian cyst  PONV (postoperative nausea and vomiting)  nausea  Postoperative urinary retention 12/08/2020  Rash on lips 12/21/2020  Seasonal allergies  Sinusitis, unspecified  Sleep apnea  On CPAP.  Slow transit constipation 12/10/2020  Trimalleolar fracture of ankle, closed, right, initial encounter 02/07/2019  Complicated by staph infection 1/21, 4 weeks IV Ancef, Menz  Ulcer   Past Surgical History:  Procedure Laterality Date  SALPINGO OOPHORECTOMY Bilateral 1993  Right total knee arthroplasty 10/11/2006  Dr Ernest Pine  Left total knee arthroplasty 10/12/2007  Dr Ernest Pine  COLONOSCOPY 07/30/2012  internal hemorrhoids, diverticulosis  ANKLE ARTHODESIS W/ ARTHROSCOPY Right 02/08/2019  03/14/2019 second surgery  COLONOSCOPY 02/22/2019  Blood in entire colon/Diverticulosis - Presumed diverticular bleed. No repeat recommended per TKT.  EGD 02/22/2019  Gastritis/gastric ulcers/Hiatal hernia/Otherwise normal - no repeat recommended per TKT.  ORIF ANKLE FRACTURE Right 03/14/2019  Dr. Rosita Kea  REMOVAL HARDWARE ANKLE FOOT/TOES Right 11/12/2019  Dr. Rosita Kea  Right knee arthrotomy, irrigation and debridement of the right knee, polyethylene exchange 01/24/2020  Dr Ernest Pine  Removal of hardware (plates and screws) from the right ankle with irrigation, debridement, and placement of Stimulan antibiotic beads 04/17/2020  Dr Ernest Pine  Right knee arthrotomy, extensive irrigation debridement, removal of right total knee implants, and placement of antibiotic impregnated polymethylmethacrylate cement spacer 04/17/2020  Dr Ernest Pine  AMPUTATION LEG ABOVE KNEE AKA Right 12/02/2020  Procedure: AMPUTATION,  THIGH, THROUGH FEMUR, ANY LEVEL; Surgeon: Starr Lake, MD; Location: [facility]; Service: Orthopedics; Laterality: Right;  TRANSFER ADJACENT TISSUE LEG Right 12/02/2020  Procedure: ADJACENT TISSUE TRANSFER OR REARRANGEMENT, LEG; DEFECT 10 SQ CM OR LESS; Surgeon: Starr Lake, MD; Location: [facility]; Service: Orthopedics; Laterality: Right;  ASPIRATION/INJECTION MAJOR JOINT/BURSA KNEE Left 12/02/2020  Procedure: ARTHROCENTESIS, ASPIRATION AND/OR INJECTION, MAJOR JOINT OR BURSA, KNEE; WITHOUT ULTRASOUND GUIDANCE; Surgeon: Starr Lake, MD; Location: [facility]; Service: Orthopedics; Laterality: Left;  REMOVAL KNEE PROSTHESIS W/POSSIBLE PLACEMENT SPACER Left 01/18/2021  Procedure: REMOVAL OF PROSTHESIS, INCLUDING TOTAL KNEE PROSTHESIS, METHYLMETHACRYLATE WITH OR WITHOUT INSERTION OF SPACER, KNEE; Surgeon: Celine Mans, MD; Location: [facility]; Service: Orthopedics; Laterality: Left;  INSERTION NON-BIODEGRADABLE DRUG DELIVERY IMPLANT Left 01/18/2021  Procedure: INSERTION, KNEE, bioresorbable, biodegradable, NON-BIODEGRADABLE DRUG DELIVERY IMPLANT; Surgeon: Celine Mans, MD; Location: [facility]; Service: Orthopedics; Laterality: Left;  ABOVE KNEE LEG AMPUTATION  12/02/2020  APPENDECTOMY Est 1968  CESAREAN SECTION  CHOLECYSTECTOMY  FRACTURE SURGERY 1/21  HYSTERECTOMY partial  JOINT REPLACEMENT 2008 & 2009  TUBAL LIGATION 3/79   ALLERGIES: Allergies  Allergen Reactions  Singulair [Montelukast] Other (See Comments)  Gland swelling  Sulfa (Sulfonamide Antibiotics) Swelling  Oxycodone Dizziness and Other (See Comments)  Vancomycin Analogues Other (See Comments)  Ringing in ears  Avinza [Morphine] Itching  Darvocet A500 [Propoxyphene N-Acetaminophen] Nausea  Nickel Rash  Ultracet [Tramadol-Acetaminophen] Rash  Vicodin [Hydrocodone-Acetaminophen] Vomiting  Vioxx [Rofecoxib] Other (See Comments)  GI   CURRENT  MEDICATIONS: Current Outpatient Medications: acetaminophen (TYLENOL) 500 MG tablet, Take 1,000 mg by mouth 3 (three) times a day Twice daily per patient., PRN Not Currently Taking aspirin 81 MG EC tablet, Take 1 tablet (81 mg total) by mouth at bedtime, Taking calcium carbonate-vitamin D3 (CALTRATE 600+D) 600 mg-10 mcg (400 unit) tablet, Take 0.5 tablets by mouth 2 (two) times daily, Taking cetirizine (ZYRTEC) 10 mg capsule, Take 1 capsule (10 mg total) by mouth once daily, Taking escitalopram oxalate (LEXAPRO) 10 MG tablet, Take 1 tablet (10 mg total) by mouth once daily, Taking estradioL (ESTRACE) 0.01 % (0.1 mg/gram) vaginal cream, Place 2 g vaginally twice a week, Taking folic acid/multivit,iron,miner (MULTIVIT-IRON-MIN-FOLIC ACID ORAL), Take 1 tablet by mouth once daily, Taking gabapentin (NEURONTIN) 300 MG capsule, Take 1 capsule (300 mg total) by mouth 3 (three) times daily Phantom limb pain, Taking melatonin 3 mg tablet, Take 1 tablet (3 mg total) by mouth at bedtime, Taking multivitamin with iron (COMPLETE MULTIVITAMIN-MINERAL) tablet, Take 1 tablet by mouth, Taking multivitamin with minerals, EYE, (PRESERVISION AREDS 2) soft gel capsule, Take 1 capsule by mouth 2 (two) times daily, Taking nystatin (MYCOSTATIN) 100,000 unit/gram powder, Apply small amount topically twice daily for rash under breasts, PRN Not Currently Taking pantoprazole (PROTONIX) 40 MG DR tablet, Take 1 tablet (40 mg total) by mouth 2 (two) times daily. Please call to schedule an appt. Thanks!, Taking polyethylene glycol (MIRALAX) powder, Take 17 g by mouth once daily Mix in 4-8ounces of fluid prior to taking., Taking sennosides-docusate (SENOKOT-S) 8.6-50 mg  tablet, Take 1 tablet by mouth 2 (two) times daily For constipation, Taking lactose-reduced food (ENSURE PLUS ORAL), Take 237 mLs by mouth 2 (two) times daily With breakfast and dinner  HPI   CLINICAL SUMMARY:  Patient post right AKA and working with prosthesis.  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 Able to do housework, improving  PAIN:  Are you having pain? Yes: NPRS scale: 5/10 Pain location: L knee Pain description: aching Aggravating factors: walking Relieving factors: rest  PRECAUTIONS: None  WEIGHT BEARING RESTRICTIONS No  FALLS:  Has patient fallen in last 6 months? No  LIVING ENVIRONMENT: Lives with: lives with their spouse Lives in: House/apartment Stairs: No Has following equipment at home: Dan Humphreys - 4 wheeled and Wheelchair (manual)  OCCUPATION: retired  PLOF: Requires assistive device for independence  PATIENT GOALS  improve standing tolerance/ pull up pants/ ambulate with SPC safely.    OBJECTIVE:   PATIENT SURVEYS:  FOTO initial 46/ goal 62.   11/2: 50  COGNITION:  Overall cognitive status: Within functional limits for tasks assessed     SENSATION: WFL  POSTURE: rounded shoulders, forward head, flexed trunk , and weight shift left  PALPATION: No tenderness along R distal residual limb.  Discussed phantom limb sensation.    LOWER EXTREMITY ROM:  B LE AROM WFL except L knee extension secondary to articulating spacer.  Did not assess L/R hip extension at this time.    LOWER EXTREMITY MMT:  MMT Right eval Left eval  Hip flexion 4/5 4/5  Hip extension    Hip abduction 4+/5 4+/5  Hip adduction 4+/5 4+/5  Hip internal rotation    Hip external rotation    Knee flexion N/A 4+/5  Knee extension N/A 4+/5  Ankle dorsiflexion  5/5  Ankle plantarflexion    Ankle inversion    Ankle eversion     (Blank rows = not tested)  GAIT: Distance walked: in gym/ //-bars Assistive device utilized: Environmental consultant - 2 wheeled Level of assistance: CGA Comments: Cuing for posture correction with mirror feedback.  Flexed posture/ heavy UE assist.     TODAY'S TREATMENT:  06/02/2022  Subjective:  Pt. Reports no new complaints.  Pt. Arrived to PT with husband and donned prosthetic leg in waiting  room.    Objective:   There.ex.:  No charge   Nustep L5 10 min. L UE/B LE warm-up prior to gait.  Improvement in prosthetic fit after Nustep.              Gait training:    Amb. From waiting room to Nustep (around PT gym) with use of RW and focus on step through gait.  SBA/CGA on R to ensure R knee mechanism locks out before wt. Bearing.  Cuing to increase R LE step length while maintaining consistent BOS/ knee mechanism extension/ heel strike.  SBA/CGA for safety with gait.  PT adjusted/tightened fit several times during gait to Nustep.  No PT assist with knee mechanism today due to improved fit of leg.     Amb. In //-bars forward working on turning/ wt. Shifting and upright posture.  Pt. Relies heavily on B UE and forward posture with walking.     Amb. In hallway (1 full length) prior to requiring seated rest break.     Pt. Able to walk from gym to car today with no standing rest breaks and consistent pattern.  No LOB.    PATIENT EDUCATION:  Education details: HEP/ gait and  prosthetic training.  Person educated: Patient and Spouse Education method: Explanation, Demonstration, and Verbal cues Education comprehension: verbalized understanding, returned demonstration, and tactile cues required   HOME EXERCISE PROGRAM: Prone position hip stretches/ Standing marching at kitchen counter with w/c behind patient.      ASSESSMENT:  CLINICAL IMPRESSION: Pt. Presents to PT in w/c and brought prosthetic leg.  Pt. Highly motivated to become more independent with use of RW while walking short distances in gym.  B UE/ shoulder fatigue with heavy UE assist with use of RW.  Improved R knee mechanism control at stairs and during gait in clinic as compared to last tx. Session.  Pts. Prosthetic fit determines pts. ability to ambulate/ complete stairs with decrease assistance.  Pt. Limited by increase L knee pain/ weakness during eccentric quad control during step downs.   Pt. Will benefit from skilled  PT services to improve standing tolerance/ gait independence and ADL.    OBJECTIVE IMPAIRMENTS Abnormal gait, decreased activity tolerance, decreased balance, decreased endurance, decreased mobility, difficulty walking, decreased ROM, decreased strength, decreased safety awareness, impaired flexibility, improper body mechanics, prosthetic dependency , and pain.   ACTIVITY LIMITATIONS carrying, lifting, standing, squatting, stairs, transfers, bed mobility, bathing, toileting, dressing, hygiene/grooming, and locomotion level  PARTICIPATION LIMITATIONS: cleaning, laundry, driving, shopping, community activity, and yard work  PERSONAL FACTORS Past/current experiences are also affecting patient's functional outcome.   REHAB POTENTIAL: Good  CLINICAL DECISION MAKING: Evolving/moderate complexity  EVALUATION COMPLEXITY: High   GOALS: Goals reviewed with patient Yes  LONG TERM GOALS: Target date: 07/07/22  Pt. Will increase FOTO to 54 to improve functional mobility.  Baseline: initial FOTO 46.  11/2: 50.  4/25: 60  Goal status: Goal met  2.  Pt. Able to tolerate standing 10 minutes while pulling up pants with mod. I safely while wearing prosthetic leg to improve daily activities.  Baseline:  limited standing tolerance Goal status: Partially met  3.  Pt. Will ambulate 200 feet with use of SPC and mod. I to promote safety with household tasks/ walking into bathroom.   Baseline:  amb. With RW and min. A for safety.  2/27: walking in clinic with SBA/mod. I for 100 feet with RW and use of prosthetic leg  Goal status: Not met  4.  Pt. Will ascend 10 stairs with use of single forearm crutch/ handrail and step to pattern with mod. I safely.  Baseline:  see above Goal status: Not met    PLAN: PT FREQUENCY: 2x/week  PT DURATION: 6 weeks  PLANNED INTERVENTIONS: Therapeutic exercises, Therapeutic activity, Neuromuscular re-education, Balance training, Gait training, Patient/Family education,  Self Care, Joint mobilization, Prosthetic training, DME instructions, and Manual therapy  PLAN FOR NEXT SESSION:  Progress gait/ standing tolerance.    Cammie Mcgee, PT, DPT # [phone] 9:08 PM,06/04/22

## 2022-06-07 ENCOUNTER — Ambulatory Visit: Payer: Medicare Other | Admitting: Physical Therapy

## 2022-06-07 DIAGNOSIS — R269 Unspecified abnormalities of gait and mobility: Secondary | ICD-10-CM

## 2022-06-07 DIAGNOSIS — R293 Abnormal posture: Secondary | ICD-10-CM

## 2022-06-07 DIAGNOSIS — Z89611 Acquired absence of right leg above knee: Secondary | ICD-10-CM

## 2022-06-07 DIAGNOSIS — M6281 Muscle weakness (generalized): Secondary | ICD-10-CM

## 2022-06-09 ENCOUNTER — Ambulatory Visit: Payer: Medicare Other | Admitting: Physical Therapy

## 2022-06-09 DIAGNOSIS — Z89611 Acquired absence of right leg above knee: Secondary | ICD-10-CM | POA: Diagnosis not present

## 2022-06-09 DIAGNOSIS — M6281 Muscle weakness (generalized): Secondary | ICD-10-CM

## 2022-06-09 DIAGNOSIS — R269 Unspecified abnormalities of gait and mobility: Secondary | ICD-10-CM

## 2022-06-09 DIAGNOSIS — R293 Abnormal posture: Secondary | ICD-10-CM

## 2022-06-14 ENCOUNTER — Ambulatory Visit: Payer: Medicare Other | Admitting: Physical Therapy

## 2022-06-16 ENCOUNTER — Ambulatory Visit: Payer: Medicare Other | Admitting: Physical Therapy

## 2022-06-16 DIAGNOSIS — Z89611 Acquired absence of right leg above knee: Secondary | ICD-10-CM | POA: Diagnosis not present

## 2022-06-16 DIAGNOSIS — R293 Abnormal posture: Secondary | ICD-10-CM

## 2022-06-16 DIAGNOSIS — M6281 Muscle weakness (generalized): Secondary | ICD-10-CM

## 2022-06-16 DIAGNOSIS — R269 Unspecified abnormalities of gait and mobility: Secondary | ICD-10-CM

## 2022-06-17 NOTE — Therapy (Signed)
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   Hypertension    PONV (postoperative nausea and vomiting)    Seasonal allergies    Sleep apnea    does not uses CPAP machine anymore    Wheelchair dependent    able to self transfer   Past Surgical History:  Procedure Laterality Date   APPENDECTOMY     CATARACT EXTRACTION W/PHACO Right 02/28/2022   Procedure: CATARACT EXTRACTION PHACO AND INTRAOCULAR LENS PLACEMENT (IOC) RIGHT  4.82  00:30.9;  Surgeon: Jasmine Crane, MD;  Location: [facility];  Service: Ophthalmology;  Laterality: Right;  sleep apnea   CATARACT EXTRACTION W/PHACO Left 03/14/2022   Procedure: CATARACT EXTRACTION PHACO AND INTRAOCULAR LENS PLACEMENT (IOC) LEFT;  Surgeon: Jasmine Crane, MD;  Location: [facility];  Service: Ophthalmology;  Laterality: Left;  4.32 0:39.7   CESAREAN SECTION     CHOLECYSTECTOMY     DILATION AND CURETTAGE OF UTERUS     ESOPHAGOGASTRODUODENOSCOPY N/A 02/22/2019   Procedure: ESOPHAGOGASTRODUODENOSCOPY (EGD);  Surgeon: Toledo, Boykin Nearing, MD;  Location: [facility];  Service: Gastroenterology;  Laterality: N/A;   FLEXIBLE SIGMOIDOSCOPY N/A 02/22/2019   Procedure: FLEXIBLE SIGMOIDOSCOPY;  Surgeon: Toledo, Boykin Nearing, MD;  Location: [facility];  Service: Gastroenterology;  Laterality: N/A;   HARDWARE REMOVAL Right 11/12/2019   Procedure: Right ankle hardware removal;  Surgeon: Jasmine Bucker, MD;  Location: [facility];   Service: Orthopedics;  Laterality: Right;   HARDWARE REMOVAL Right 04/17/2020   Procedure: HARDWARE REMOVAL FROM RIGHT ANKLE AND RIGHT KNEE; IMPLANT OF CEMENT SPACERS IN RIGHT KNEE;  Surgeon: Jasmine Heinz, MD;  Location: [facility];  Service: Orthopedics;  Laterality: Right;   JOINT REPLACEMENT     LEG AMPUTATION ABOVE KNEE Right 12/02/2020   Jasmine Morrow   ORIF ANKLE FRACTURE Right 03/14/2019   Procedure: OPEN REDUCTION INTERNAL FIXATION (ORIF) ANKLE FRACTURE, MEDIAL MALLEOLUS;  Surgeon: Jasmine Bucker, MD;  Location: [facility];  Service: Orthopedics;  Laterality: Right;   ORIF ANKLE FRACTURE Right 02/08/2019   Procedure: OPEN REDUCTION INTERNAL FIXATION (ORIF) ANKLE FRACTURE, post malleolus;  Surgeon: Jasmine Bucker, MD;  Location: [facility];  Service: Orthopedics;  Laterality: Right;   PARTIAL HYSTERECTOMY  1993   REPLACEMENT TOTAL KNEE Bilateral 2008   2009   SCAR DEBRIDEMENT OF TOTAL KNEE  01/2020   SYNDESMOSIS REPAIR Right 02/08/2019   Procedure: SYNDESMOSIS REPAIR;  Surgeon: Jasmine Bucker, MD;  Location: [facility];  Service: Orthopedics;  Laterality: Right;   TEE WITHOUT CARDIOVERSION N/A 02/26/2019   Procedure: TRANSESOPHAGEAL ECHOCARDIOGRAM (TEE);  Surgeon: Jasmine Blinks, MD;  Location: [facility];  Service: Cardiovascular;  Laterality: N/A;   TOTAL KNEE REVISION WITH SCAR DEBRIDEMENT/PATELLA REVISION WITH POLY EXCHANGE Right 01/24/2020   Procedure: TOTAL KNEE REVISION WITH SCAR DEBRIDEMENT/PATELLA REVISION WITH POLY EXCHANGE;  Surgeon: Jasmine Sor  P, MD;  Location: [facility];  Service: Orthopedics;  Laterality: Right;   TUMOR REMOVAL     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   Patient Active Problem List   Diagnosis Date Noted   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   Anxiety 04/17/2020   History of GI diverticular bleed 04/17/2020   Chest pain 04/17/2020   PAD (peripheral artery disease) (HCC) 04/05/2020   Ulcer of right leg (HCC) 04/05/2020   COPD (chronic obstructive  pulmonary disease) (HCC) 04/05/2020   GERD (gastroesophageal reflux disease) 04/05/2020   Abscess of right knee 01/22/2020   Pressure injury of skin 02/28/2019   SOB (shortness of breath)    Gastrointestinal hemorrhage    Sepsis (HCC)    Symptomatic anemia 02/20/2019   Trimalleolar fracture of ankle, closed, right, initial encounter 02/07/2019   Multiple lung nodules 03/31/2014   Extrinsic asthma 03/27/2014   OSA on CPAP 03/27/2014    PCP: Jasmine Penton, MD  REFERRING PROVIDER: Danella Penton, MD  REFERRING DIAG: Above knee amputation of right lower extremity  THERAPY DIAG:  S/Morrow AKA (above knee amputation) unilateral, right (HCC)  Muscle weakness (generalized)  Gait difficulty  Abnormal posture  Rationale for Evaluation and Treatment Rehabilitation  ONSET DATE: 12/02/20  SUBJECTIVE:  EVALUATION  PERTINENT HISTORY: Patient Profile:   Jasmine Morrow is a 77 y.o. female Chief Complaint  Patient presents with  Visit Follow Up  Doing well.   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 Cardiac cath 10/2011 showed completely normal coronary arteries, however she did have mild left ventricular dysfunction with anterior wall motion abnormality. Left ventricular EF was 51%.  History of chickenpox  Hyperlipidemia  patient states she has never been diagnosed with this  Hypertension  Knee  joint replacement by other means  Macular degeneration  Major depressive disorder, recurrent, mild (CMS-HCC)  Medicare annual wellness visit, initial 08/09/2019  7/21  Multiple thyroid nodules  Normal coronary arteries 09/06/2013  Normal coronary anatomy by cardiac catheterization 10/12/11  Osteoarthritis  Ovarian cyst  PONV (postoperative nausea and vomiting)  nausea  Postoperative urinary retention 12/08/2020  Rash on lips 12/21/2020  Seasonal allergies  Sinusitis, unspecified  Sleep apnea  On CPAP.  Slow transit constipation 12/10/2020  Trimalleolar fracture of ankle, closed, right, initial encounter 02/07/2019  Complicated by staph infection 1/21, 4 weeks IV Ancef, Menz  Ulcer   Past Surgical History:  Procedure Laterality Date  SALPINGO OOPHORECTOMY Bilateral 1993  Right total knee arthroplasty 10/11/2006  Dr Ernest Pine  Left total knee arthroplasty 10/12/2007  Dr Ernest Pine  COLONOSCOPY 07/30/2012  internal hemorrhoids, diverticulosis  ANKLE ARTHODESIS W/ ARTHROSCOPY Right 02/08/2019  03/14/2019 second surgery  COLONOSCOPY 02/22/2019  Blood in entire colon/Diverticulosis - Presumed diverticular bleed. No repeat recommended per TKT.  EGD 02/22/2019  Gastritis/gastric ulcers/Hiatal hernia/Otherwise normal - no repeat recommended per TKT.  ORIF ANKLE FRACTURE Right 03/14/2019  Dr. Rosita Kea  REMOVAL HARDWARE ANKLE FOOT/TOES Right 11/12/2019  Dr. Rosita Kea  Right knee arthrotomy, irrigation and debridement of the right knee, polyethylene exchange 01/24/2020  Dr Ernest Pine  Removal of hardware (plates and screws) from the right ankle with irrigation, debridement, and placement of Stimulan antibiotic beads 04/17/2020  Dr Ernest Pine  Right knee arthrotomy, extensive irrigation debridement, removal of right total knee implants, and placement of antibiotic impregnated polymethylmethacrylate cement spacer 04/17/2020  Dr Ernest Pine  AMPUTATION LEG ABOVE KNEE AKA Right 12/02/2020  Procedure: AMPUTATION,  THIGH, THROUGH FEMUR, ANY LEVEL; Surgeon: Starr Lake, MD; [facility]; Service: Orthopedics; Laterality: Right;  TRANSFER ADJACENT TISSUE LEG Right 12/02/2020  Procedure: ADJACENT TISSUE TRANSFER OR REARRANGEMENT, LEG; DEFECT 10 SQ CM OR LESS; Surgeon: Starr Lake, MD; [facility]; Service: Orthopedics; Laterality: Right;  ASPIRATION/INJECTION MAJOR JOINT/BURSA KNEE Left 12/02/2020  Procedure: ARTHROCENTESIS, ASPIRATION AND/OR INJECTION, MAJOR JOINT OR BURSA, KNEE; WITHOUT ULTRASOUND GUIDANCE; Surgeon: Starr Lake, MD; [facility]; Service: Orthopedics; Laterality: Left;  REMOVAL KNEE PROSTHESIS W/POSSIBLE PLACEMENT SPACER Left 01/18/2021  Procedure: REMOVAL OF PROSTHESIS, INCLUDING TOTAL KNEE PROSTHESIS, METHYLMETHACRYLATE WITH OR WITHOUT INSERTION OF SPACER, KNEE; Surgeon: Celine Mans, MD; [facility]; Service: Orthopedics; Laterality: Left;  INSERTION NON-BIODEGRADABLE DRUG DELIVERY IMPLANT Left 01/18/2021  Procedure: INSERTION, KNEE, bioresorbable, biodegradable, NON-BIODEGRADABLE DRUG DELIVERY IMPLANT; Surgeon: Celine Mans, MD; [facility]; Service: Orthopedics; Laterality: Left;  ABOVE KNEE LEG AMPUTATION  12/02/2020  APPENDECTOMY Est 1968  CESAREAN SECTION  CHOLECYSTECTOMY  FRACTURE SURGERY 1/21  HYSTERECTOMY partial  JOINT REPLACEMENT 2008 & 2009  TUBAL LIGATION 3/79   ALLERGIES: Allergies  Allergen Reactions  Singulair [Montelukast] Other (See Comments)  Gland swelling  Sulfa (Sulfonamide Antibiotics) Swelling  Oxycodone Dizziness and Other (See Comments)  Vancomycin Analogues Other (See Comments)  Ringing in ears  Avinza [Morphine] Itching  Darvocet A500 [Propoxyphene N-Acetaminophen] Nausea  Nickel Rash  Ultracet [Tramadol-Acetaminophen] Rash  Vicodin [Hydrocodone-Acetaminophen] Vomiting  Vioxx [Rofecoxib] Other (See Comments)  GI   CURRENT  MEDICATIONS: Current Outpatient Medications: acetaminophen (TYLENOL) 500 MG tablet, Take 1,000 mg by mouth 3 (three) times a day Twice daily per patient., PRN Not Currently Taking aspirin 81 MG EC tablet, Take 1 tablet (81 mg total) by mouth at bedtime, Taking calcium carbonate-vitamin D3 (CALTRATE 600+D) 600 mg-10 mcg (400 unit) tablet, Take 0.5 tablets by mouth 2 (two) times daily, Taking cetirizine (ZYRTEC) 10 mg capsule, Take 1 capsule (10 mg total) by mouth once daily, Taking escitalopram oxalate (LEXAPRO) 10 MG tablet, Take 1 tablet (10 mg total) by mouth once daily, Taking estradioL (ESTRACE) 0.01 % (0.1 mg/gram) vaginal cream, Place 2 g vaginally twice a week, Taking folic acid/multivit,iron,miner (MULTIVIT-IRON-MIN-FOLIC ACID ORAL), Take 1 tablet by mouth once daily, Taking gabapentin (NEURONTIN) 300 MG capsule, Take 1 capsule (300 mg total) by mouth 3 (three) times daily Phantom limb pain, Taking melatonin 3 mg tablet, Take 1 tablet (3 mg total) by mouth at bedtime, Taking multivitamin with iron (COMPLETE MULTIVITAMIN-MINERAL) tablet, Take 1 tablet by mouth, Taking multivitamin with minerals, EYE, (PRESERVISION AREDS 2) soft gel capsule, Take 1 capsule by mouth 2 (two) times daily, Taking nystatin (MYCOSTATIN) 100,000 unit/gram powder, Apply small amount topically twice daily for rash under breasts, PRN Not Currently Taking pantoprazole (PROTONIX) 40 MG DR tablet, Take 1 tablet (40 mg total) by mouth 2 (two) times daily. Please call to schedule an appt. Thanks!, Taking polyethylene glycol (MIRALAX) powder, Take 17 g by mouth once daily Mix in 4-8ounces of fluid prior to taking., Taking sennosides-docusate (SENOKOT-S) 8.6-50 mg  tablet, Take 1 tablet by mouth 2 (two) times daily For constipation, Taking lactose-reduced food (ENSURE PLUS ORAL), Take 237 mLs by mouth 2 (two) times daily With breakfast and dinner  HPI   CLINICAL SUMMARY:  Patient post right AKA and working with prosthesis.  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 Able to do housework, improving  PAIN:  Are you having pain? Yes: NPRS scale: 5/10 Pain location: L knee Pain description: aching Aggravating factors: walking Relieving factors: rest  PRECAUTIONS: None  WEIGHT BEARING RESTRICTIONS No  FALLS:  Has patient fallen in last 6 months? No  LIVING ENVIRONMENT: Lives with: lives with their spouse Lives in: House/apartment Stairs: No Has following equipment at home: Dan Humphreys - 4 wheeled and Wheelchair (manual)  OCCUPATION: retired  PLOF: Requires assistive device for independence  PATIENT GOALS  improve standing tolerance/ pull up pants/ ambulate with SPC safely.    OBJECTIVE:   PATIENT SURVEYS:  FOTO initial 46/ goal 65.   11/2: 50  COGNITION:  Overall cognitive status: Within functional limits for tasks assessed     SENSATION: WFL  POSTURE: rounded shoulders, forward head, flexed trunk , and weight shift left  PALPATION: No tenderness along R distal residual limb.  Discussed phantom limb sensation.    LOWER EXTREMITY ROM:  B LE AROM WFL except L knee extension secondary to articulating spacer.  Did not assess L/R hip extension at this time.    LOWER EXTREMITY MMT:  MMT Right eval Left eval  Hip flexion 4/5 4/5  Hip extension    Hip abduction 4+/5 4+/5  Hip adduction 4+/5 4+/5  Hip internal rotation    Hip external rotation    Knee flexion N/A 4+/5  Knee extension N/A 4+/5  Ankle dorsiflexion  5/5  Ankle plantarflexion    Ankle inversion    Ankle eversion     (Blank rows = not tested)  GAIT: Distance walked: in gym/ //-bars Assistive device utilized: Environmental consultant - 2 wheeled Level of assistance: CGA Comments: Cuing for posture correction with mirror feedback.  Flexed posture/ heavy UE assist.     TODAY'S TREATMENT:  06/07/2022  Subjective:  Pt. Reports no new complaints.  Pt. Arrived to PT with husband and donned prosthetic leg in waiting  room. Pt. Ambulates with CGA from PT from waiting room to Nustep.     Objective:   There.ex.:  No charge   Nustep L5 10 min. L UE/B LE warm-up prior to gait.  Improvement in prosthetic fit after Nustep.              Gait training:    Amb. From waiting room to Nustep (around PT gym) with use of RW and focus on step through gait.  SBA/CGA on R to ensure R knee mechanism locks out before wt. Bearing.  Cuing to increase R LE step length while maintaining consistent BOS/ knee mechanism extension/ heel strike.  SBA/CGA for safety with gait.  PT adjusted/tightened fit several times during gait to Nustep.  No PT assist with knee mechanism today due to improved fit of leg.     Amb. In //-bars forward working on turning/ wt. Shifting and upright posture.  Pt. Relies heavily on B UE and forward posture with walking.     Sit to stands from gray chair in front of //-bars.  Pt. Fatigued in B UE/shoulders with standing and requires //-bars to pull up.     Amb. In hallway (1.5 full length) prior  to requiring seated rest break.     Pt. Able to walk from gym to car today with no standing rest breaks and consistent pattern.  No LOB.    PATIENT EDUCATION:  Education details: HEP/ gait and prosthetic training.  Person educated: Patient and Spouse Education method: Explanation, Demonstration, and Verbal cues Education comprehension: verbalized understanding, returned demonstration, and tactile cues required   HOME EXERCISE PROGRAM: Prone position hip stretches/ Standing marching at kitchen counter with w/c behind patient.      ASSESSMENT:  CLINICAL IMPRESSION: Pt. Presents to PT in w/c and brought prosthetic leg.  Pt. Highly motivated to become more independent with use of RW while walking short distances in gym.  B UE/ shoulder fatigue with heavy UE assist with use of RW.  Pt. Limited with transfers due to B shoulder/ UE muscle fatigue and pain. Pts. Prosthetic fit determines pts. ability to ambulate/  complete stairs with decrease assistance.  Pt. Will benefit from skilled PT services to improve standing tolerance/ gait independence and ADL.    OBJECTIVE IMPAIRMENTS Abnormal gait, decreased activity tolerance, decreased balance, decreased endurance, decreased mobility, difficulty walking, decreased ROM, decreased strength, decreased safety awareness, impaired flexibility, improper body mechanics, prosthetic dependency , and pain.   ACTIVITY LIMITATIONS carrying, lifting, standing, squatting, stairs, transfers, bed mobility, bathing, toileting, dressing, hygiene/grooming, and locomotion level  PARTICIPATION LIMITATIONS: cleaning, laundry, driving, shopping, community activity, and yard work  PERSONAL FACTORS Past/current experiences are also affecting patient's functional outcome.   REHAB POTENTIAL: Good  CLINICAL DECISION MAKING: Evolving/moderate complexity  EVALUATION COMPLEXITY: High   GOALS: Goals reviewed with patient Yes  LONG TERM GOALS: Target date: 07/07/22  Pt. Will increase FOTO to 54 to improve functional mobility.  Baseline: initial FOTO 46.  11/2: 50.  4/25: 60  Goal status: Goal met  2.  Pt. Able to tolerate standing 10 minutes while pulling up pants with mod. I safely while wearing prosthetic leg to improve daily activities.  Baseline:  limited standing tolerance Goal status: Partially met  3.  Pt. Will ambulate 200 feet with use of SPC and mod. I to promote safety with household tasks/ walking into bathroom.   Baseline:  amb. With RW and min. A for safety.  2/27: walking in clinic with SBA/mod. I for 100 feet with RW and use of prosthetic leg  Goal status: Not met  4.  Pt. Will ascend 10 stairs with use of single forearm crutch/ handrail and step to pattern with mod. I safely.  Baseline:  see above Goal status: Not met    PLAN: PT FREQUENCY: 2x/week  PT DURATION: 6 weeks  PLANNED INTERVENTIONS: Therapeutic exercises, Therapeutic activity, Neuromuscular  re-education, Balance training, Gait training, Patient/Family education, Self Care, Joint mobilization, Prosthetic training, DME instructions, and Manual therapy  PLAN FOR NEXT SESSION:  Progress gait/ standing tolerance.    Cammie Mcgee, PT, DPT # [id number] 6:44 PM,06/17/22

## 2022-06-18 NOTE — Therapy (Signed)
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   Hypertension    PONV (postoperative nausea and vomiting)    Seasonal allergies    Sleep apnea    does not uses CPAP machine anymore    Wheelchair dependent    able to self transfer   Past Surgical History:  Procedure Laterality Date   APPENDECTOMY     CATARACT EXTRACTION W/PHACO Right 02/28/2022   Procedure: CATARACT EXTRACTION PHACO AND INTRAOCULAR LENS PLACEMENT (IOC) RIGHT  4.82  00:30.9;  Surgeon: Jasmine Crane, MD;  Location: [facility];  Service: Ophthalmology;  Laterality: Right;  sleep apnea   CATARACT EXTRACTION W/PHACO Left 03/14/2022   Procedure: CATARACT EXTRACTION PHACO AND INTRAOCULAR LENS PLACEMENT (IOC) LEFT;  Surgeon: Jasmine Crane, MD;  Location: [facility];  Service: Ophthalmology;  Laterality: Left;  4.32 0:39.7   CESAREAN SECTION     CHOLECYSTECTOMY     DILATION AND CURETTAGE OF UTERUS     ESOPHAGOGASTRODUODENOSCOPY N/A 02/22/2019   Procedure: ESOPHAGOGASTRODUODENOSCOPY (EGD);  Surgeon: Toledo, Boykin Nearing, MD;  Location: [facility];  Service: Gastroenterology;  Laterality: N/A;   FLEXIBLE SIGMOIDOSCOPY N/A 02/22/2019   Procedure: FLEXIBLE SIGMOIDOSCOPY;  Surgeon: Toledo, Boykin Nearing, MD;  Location: [facility];  Service: Gastroenterology;  Laterality: N/A;   HARDWARE REMOVAL Right 11/12/2019   Procedure: Right ankle hardware removal;  Surgeon: Jasmine Bucker, MD;  Location: [facility];   Service: Orthopedics;  Laterality: Right;   HARDWARE REMOVAL Right 04/17/2020   Procedure: HARDWARE REMOVAL FROM RIGHT ANKLE AND RIGHT KNEE; IMPLANT OF CEMENT SPACERS IN RIGHT KNEE;  Surgeon: Jasmine Heinz, MD;  Location: [facility];  Service: Orthopedics;  Laterality: Right;   JOINT REPLACEMENT     LEG AMPUTATION ABOVE KNEE Right 12/02/2020   [facility]   ORIF ANKLE FRACTURE Right 03/14/2019   Procedure: OPEN REDUCTION INTERNAL FIXATION (ORIF) ANKLE FRACTURE, MEDIAL MALLEOLUS;  Surgeon: Jasmine Bucker, MD;  Location: [facility];  Service: Orthopedics;  Laterality: Right;   ORIF ANKLE FRACTURE Right 02/08/2019   Procedure: OPEN REDUCTION INTERNAL FIXATION (ORIF) ANKLE FRACTURE, post malleolus;  Surgeon: Jasmine Bucker, MD;  Location: [facility];  Service: Orthopedics;  Laterality: Right;   PARTIAL HYSTERECTOMY  1993   REPLACEMENT TOTAL KNEE Bilateral 2008   2009   SCAR DEBRIDEMENT OF TOTAL KNEE  01/2020   SYNDESMOSIS REPAIR Right 02/08/2019   Procedure: SYNDESMOSIS REPAIR;  Surgeon: Jasmine Bucker, MD;  Location: [facility];  Service: Orthopedics;  Laterality: Right;   TEE WITHOUT CARDIOVERSION N/A 02/26/2019   Procedure: TRANSESOPHAGEAL ECHOCARDIOGRAM (TEE);  Surgeon: Jasmine Blinks, MD;  Location: [facility];  Service: Cardiovascular;  Laterality: N/A;   TOTAL KNEE REVISION WITH SCAR DEBRIDEMENT/PATELLA REVISION WITH POLY EXCHANGE Right 01/24/2020   Procedure: TOTAL KNEE REVISION WITH SCAR DEBRIDEMENT/PATELLA REVISION WITH POLY EXCHANGE;  Surgeon: Jasmine Sor  P, MD;  Location: [facility];  Service: Orthopedics;  Laterality: Right;   TUMOR REMOVAL     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   Patient Active Problem List   Diagnosis Date Noted   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   Anxiety 04/17/2020   History of GI diverticular bleed 04/17/2020   Chest pain 04/17/2020   PAD (peripheral artery disease) (HCC) 04/05/2020   Ulcer of right leg (HCC) 04/05/2020   COPD (chronic obstructive  pulmonary disease) (HCC) 04/05/2020   GERD (gastroesophageal reflux disease) 04/05/2020   Abscess of right knee 01/22/2020   Pressure injury of skin 02/28/2019   SOB (shortness of breath)    Gastrointestinal hemorrhage    Sepsis (HCC)    Symptomatic anemia 02/20/2019   Trimalleolar fracture of ankle, closed, right, initial encounter 02/07/2019   Multiple lung nodules 03/31/2014   Extrinsic asthma 03/27/2014   OSA on CPAP 03/27/2014    PCP: Jasmine Penton, MD  REFERRING PROVIDER: Danella Penton, MD  REFERRING DIAG: Above knee amputation of right lower extremity  THERAPY DIAG:  S/Morrow AKA (above knee amputation) unilateral, right (HCC)  Muscle weakness (generalized)  Gait difficulty  Abnormal posture  Rationale for Evaluation and Treatment Rehabilitation  ONSET DATE: 12/02/20  SUBJECTIVE:  EVALUATION  PERTINENT HISTORY: Patient Profile:   Jasmine Morrow is a 77 y.o. female Chief Complaint  Patient presents with  Visit Follow Up  Doing well.   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 Cardiac cath 10/2011 showed completely normal coronary arteries, however she did have mild left ventricular dysfunction with anterior wall motion abnormality. Left ventricular EF was 51%.  History of chickenpox  Hyperlipidemia  patient states she has never been diagnosed with this  Hypertension  Knee  joint replacement by other means  Macular degeneration  Major depressive disorder, recurrent, mild (CMS-HCC)  Medicare annual wellness visit, initial 08/09/2019  7/21  Multiple thyroid nodules  Normal coronary arteries 09/06/2013  Normal coronary anatomy by cardiac catheterization 10/12/11  Osteoarthritis  Ovarian cyst  PONV (postoperative nausea and vomiting)  nausea  Postoperative urinary retention 12/08/2020  Rash on lips 12/21/2020  Seasonal allergies  Sinusitis, unspecified  Sleep apnea  On CPAP.  Slow transit constipation 12/10/2020  Trimalleolar fracture of ankle, closed, right, initial encounter 02/07/2019  Complicated by staph infection 1/21, 4 weeks IV Ancef, Menz  Ulcer   Past Surgical History:  Procedure Laterality Date  SALPINGO OOPHORECTOMY Bilateral 1993  Right total knee arthroplasty 10/11/2006  Dr Ernest Pine  Left total knee arthroplasty 10/12/2007  Dr Ernest Pine  COLONOSCOPY 07/30/2012  internal hemorrhoids, diverticulosis  ANKLE ARTHODESIS W/ ARTHROSCOPY Right 02/08/2019  03/14/2019 second surgery  COLONOSCOPY 02/22/2019  Blood in entire colon/Diverticulosis - Presumed diverticular bleed. No repeat recommended per TKT.  EGD 02/22/2019  Gastritis/gastric ulcers/Hiatal hernia/Otherwise normal - no repeat recommended per TKT.  ORIF ANKLE FRACTURE Right 03/14/2019  Dr. Rosita Kea  REMOVAL HARDWARE ANKLE FOOT/TOES Right 11/12/2019  Dr. Rosita Kea  Right knee arthrotomy, irrigation and debridement of the right knee, polyethylene exchange 01/24/2020  Dr Ernest Pine  Removal of hardware (plates and screws) from the right ankle with irrigation, debridement, and placement of Stimulan antibiotic beads 04/17/2020  Dr Ernest Pine  Right knee arthrotomy, extensive irrigation debridement, removal of right total knee implants, and placement of antibiotic impregnated polymethylmethacrylate cement spacer 04/17/2020  Dr Ernest Pine  AMPUTATION LEG ABOVE KNEE AKA Right 12/02/2020  Procedure: AMPUTATION,  THIGH, THROUGH FEMUR, ANY LEVEL; Surgeon: Starr Lake, MD; Location: [facility]; Service: Orthopedics; Laterality: Right;  TRANSFER ADJACENT TISSUE LEG Right 12/02/2020  Procedure: ADJACENT TISSUE TRANSFER OR REARRANGEMENT, LEG; DEFECT 10 SQ CM OR LESS; Surgeon: Starr Lake, MD; Location: [facility]; Service: Orthopedics; Laterality: Right;  ASPIRATION/INJECTION MAJOR JOINT/BURSA KNEE Left 12/02/2020  Procedure: ARTHROCENTESIS, ASPIRATION AND/OR INJECTION, MAJOR JOINT OR BURSA, KNEE; WITHOUT ULTRASOUND GUIDANCE; Surgeon: Starr Lake, MD; Location: [facility]; Service: Orthopedics; Laterality: Left;  REMOVAL KNEE PROSTHESIS W/POSSIBLE PLACEMENT SPACER Left 01/18/2021  Procedure: REMOVAL OF PROSTHESIS, INCLUDING TOTAL KNEE PROSTHESIS, METHYLMETHACRYLATE WITH OR WITHOUT INSERTION OF SPACER, KNEE; Surgeon: Celine Mans, MD; Location: [facility]; Service: Orthopedics; Laterality: Left;  INSERTION NON-BIODEGRADABLE DRUG DELIVERY IMPLANT Left 01/18/2021  Procedure: INSERTION, KNEE, bioresorbable, biodegradable, NON-BIODEGRADABLE DRUG DELIVERY IMPLANT; Surgeon: Celine Mans, MD; Location: [facility]; Service: Orthopedics; Laterality: Left;  ABOVE KNEE LEG AMPUTATION  12/02/2020  APPENDECTOMY Est 1968  CESAREAN SECTION  CHOLECYSTECTOMY  FRACTURE SURGERY 1/21  HYSTERECTOMY partial  JOINT REPLACEMENT 2008 & 2009  TUBAL LIGATION 3/79   ALLERGIES: Allergies  Allergen Reactions  Singulair [Montelukast] Other (See Comments)  Gland swelling  Sulfa (Sulfonamide Antibiotics) Swelling  Oxycodone Dizziness and Other (See Comments)  Vancomycin Analogues Other (See Comments)  Ringing in ears  Avinza [Morphine] Itching  Darvocet A500 [Propoxyphene N-Acetaminophen] Nausea  Nickel Rash  Ultracet [Tramadol-Acetaminophen] Rash  Vicodin [Hydrocodone-Acetaminophen] Vomiting  Vioxx [Rofecoxib] Other (See Comments)  GI   CURRENT  MEDICATIONS: Current Outpatient Medications: acetaminophen (TYLENOL) 500 MG tablet, Take 1,000 mg by mouth 3 (three) times a day Twice daily per patient., PRN Not Currently Taking aspirin 81 MG EC tablet, Take 1 tablet (81 mg total) by mouth at bedtime, Taking calcium carbonate-vitamin D3 (CALTRATE 600+D) 600 mg-10 mcg (400 unit) tablet, Take 0.5 tablets by mouth 2 (two) times daily, Taking cetirizine (ZYRTEC) 10 mg capsule, Take 1 capsule (10 mg total) by mouth once daily, Taking escitalopram oxalate (LEXAPRO) 10 MG tablet, Take 1 tablet (10 mg total) by mouth once daily, Taking estradioL (ESTRACE) 0.01 % (0.1 mg/gram) vaginal cream, Place 2 g vaginally twice a week, Taking folic acid/multivit,iron,miner (MULTIVIT-IRON-MIN-FOLIC ACID ORAL), Take 1 tablet by mouth once daily, Taking gabapentin (NEURONTIN) 300 MG capsule, Take 1 capsule (300 mg total) by mouth 3 (three) times daily Phantom limb pain, Taking melatonin 3 mg tablet, Take 1 tablet (3 mg total) by mouth at bedtime, Taking multivitamin with iron (COMPLETE MULTIVITAMIN-MINERAL) tablet, Take 1 tablet by mouth, Taking multivitamin with minerals, EYE, (PRESERVISION AREDS 2) soft gel capsule, Take 1 capsule by mouth 2 (two) times daily, Taking nystatin (MYCOSTATIN) 100,000 unit/gram powder, Apply small amount topically twice daily for rash under breasts, PRN Not Currently Taking pantoprazole (PROTONIX) 40 MG DR tablet, Take 1 tablet (40 mg total) by mouth 2 (two) times daily. Please call to schedule an appt. Thanks!, Taking polyethylene glycol (MIRALAX) powder, Take 17 g by mouth once daily Mix in 4-8ounces of fluid prior to taking., Taking sennosides-docusate (SENOKOT-S) 8.6-50 mg  tablet, Take 1 tablet by mouth 2 (two) times daily For constipation, Taking lactose-reduced food (ENSURE PLUS ORAL), Take 237 mLs by mouth 2 (two) times daily With breakfast and dinner  HPI   CLINICAL SUMMARY:  Patient post right AKA and working with prosthesis.  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 Able to do housework, improving  PAIN:  Are you having pain? Yes: NPRS scale: 5/10 Pain location: L knee Pain description: aching Aggravating factors: walking Relieving factors: rest  PRECAUTIONS: None  WEIGHT BEARING RESTRICTIONS No  FALLS:  Has patient fallen in last 6 months? No  LIVING ENVIRONMENT: Lives with: lives with their spouse Lives in: House/apartment Stairs: No Has following equipment at home: Dan Humphreys - 4 wheeled and Wheelchair (manual)  OCCUPATION: retired  PLOF: Requires assistive device for independence  PATIENT GOALS  improve standing tolerance/ pull up pants/ ambulate with SPC safely.    OBJECTIVE:   PATIENT SURVEYS:  FOTO initial 46/ goal 26.   11/2: 50  COGNITION:  Overall cognitive status: Within functional limits for tasks assessed     SENSATION: WFL  POSTURE: rounded shoulders, forward head, flexed trunk , and weight shift left  PALPATION: No tenderness along R distal residual limb.  Discussed phantom limb sensation.    LOWER EXTREMITY ROM:  B LE AROM WFL except L knee extension secondary to articulating spacer.  Did not assess L/R hip extension at this time.    LOWER EXTREMITY MMT:  MMT Right eval Left eval  Hip flexion 4/5 4/5  Hip extension    Hip abduction 4+/5 4+/5  Hip adduction 4+/5 4+/5  Hip internal rotation    Hip external rotation    Knee flexion N/A 4+/5  Knee extension N/A 4+/5  Ankle dorsiflexion  5/5  Ankle plantarflexion    Ankle inversion    Ankle eversion     (Blank rows = not tested)  GAIT: Distance walked: in gym/ //-bars Assistive device utilized: Environmental consultant - 2 wheeled Level of assistance: CGA Comments: Cuing for posture correction with mirror feedback.  Flexed posture/ heavy UE assist.    TUG: UE assist required to stand/ use of RW.  51.9 seconds.     TODAY'S TREATMENT:  06/16/2022  Subjective:  Pt. Arrived to PT with husband and  donned prosthetic leg on blue mat table.  Pts. Fit of prosthetic leg is not as good as last couple treatment sessions. Pt. Missed last PT appt. Secondary to eye appt.    Objective:   There.ex.:  No charge   Nustep L5 10 min. L UE/B LE warm-up prior to gait.  Improvement in prosthetic fit after Nustep.    Neuro.mm.:  Sit to stands from blue mat table 10x (varying heights)- wt. Shifting.   Standing tolerance with RW for 10 min. Focusing on increase B LE wt. Bearing and less UE assist on RW.              Gait training:    Amb. From blue mat table to Nustep (around PT gym) with use of RW and focus on step through gait.  SBA/CGA on R to ensure R knee mechanism locks out before wt. Bearing.  Cuing to increase R LE step length while maintaining consistent BOS/ knee mechanism extension/ heel strike.  SBA/CGA for safety with gait.  PT adjusted/tightened fit several times during gait to Nustep.    Ascend/ descend 4 stairs x 1 with heavy UE assist and step to gait pattern.  Pt. Walks at agility ladder 3 laps with focus on consistent step length/ pattern.  Walking in hallway with consistent BOS/ no episodes of knee buckling.     PATIENT EDUCATION:  Education details: HEP/ gait and prosthetic training.  Person educated: Patient and Spouse Education method: Explanation, Demonstration, and Verbal cues Education comprehension: verbalized understanding, returned demonstration, and tactile cues required   HOME EXERCISE PROGRAM: Prone position hip stretches/ Standing marching at kitchen counter with w/c behind patient.      ASSESSMENT:  CLINICAL IMPRESSION: Pt. Presents to PT in w/c and brought prosthetic leg.  Pt. Highly motivated to become more independent with use of RW while walking short distances in gym.  B UE/ shoulder fatigue with heavy UE assist with use of RW.  Pt. Limited with transfers due to B shoulder/ UE muscle fatigue and pain. Pts. Prosthetic fit determines pts. ability to  ambulate/ complete stairs with decrease assistance.  Pt. Able to tolerate 10 minutes of standing with RW assist.  Pt. May benefit from tall RW for home use/ posture correction.  Pt. Will benefit from skilled PT services to improve standing tolerance/ gait independence and ADL.    OBJECTIVE IMPAIRMENTS Abnormal gait, decreased activity tolerance, decreased balance, decreased endurance, decreased mobility, difficulty walking, decreased ROM, decreased strength, decreased safety awareness, impaired flexibility, improper body mechanics, prosthetic dependency , and pain.   ACTIVITY LIMITATIONS carrying, lifting, standing, squatting, stairs, transfers, bed mobility, bathing, toileting, dressing, hygiene/grooming, and locomotion level  PARTICIPATION LIMITATIONS: cleaning, laundry, driving, shopping, community activity, and yard work  PERSONAL FACTORS Past/current experiences are also affecting patient's functional outcome.   REHAB POTENTIAL: Good  CLINICAL DECISION MAKING: Evolving/moderate complexity  EVALUATION COMPLEXITY: High   GOALS: Goals reviewed with patient Yes  LONG TERM GOALS: Target date: 07/07/22  Pt. Will increase FOTO to 54 to improve functional mobility.  Baseline: initial FOTO 46.  11/2: 50.  4/25: 60  Goal status: Goal met  2.  Pt. Able to tolerate standing 10 minutes while pulling up pants with mod. I safely while wearing prosthetic leg to improve daily activities.  Baseline:  limited standing tolerance Goal status: Partially met  3.  Pt. Will ambulate 200 feet with use of SPC and mod. I to promote safety with household tasks/ walking into bathroom.   Baseline:  amb. With RW and min. A for safety.  2/27: walking in clinic with SBA/mod. I for 100 feet with RW and use of prosthetic leg  Goal status: Not met  4.  Pt. Will ascend 10 stairs with use of single forearm crutch/ handrail and step to pattern with mod. I safely.  Baseline:  see above Goal status: Not  met    PLAN: PT FREQUENCY: 2x/week  PT DURATION: 6 weeks  PLANNED INTERVENTIONS: Therapeutic exercises, Therapeutic activity, Neuromuscular re-education, Balance training, Gait training, Patient/Family education, Self Care, Joint mobilization, Prosthetic training, DME instructions, and Manual therapy  PLAN FOR NEXT SESSION:  Progress gait/ standing tolerance.  Write MD order for tall RW/ w/c  Cammie Mcgee, PT, DPT # [phone] 7:55 PM,06/18/22

## 2022-06-18 NOTE — Therapy (Signed)
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   Hypertension    PONV (postoperative nausea and vomiting)    Seasonal allergies    Sleep apnea    does not uses CPAP machine anymore    Wheelchair dependent    able to self transfer   Past Surgical History:  Procedure Laterality Date   APPENDECTOMY     CATARACT EXTRACTION W/PHACO Right 02/28/2022   Procedure: CATARACT EXTRACTION PHACO AND INTRAOCULAR LENS PLACEMENT (IOC) RIGHT  4.82  00:30.9;  Surgeon: Nevada Crane, MD;  Location: [facility];  Service: Ophthalmology;  Laterality: Right;  sleep apnea   CATARACT EXTRACTION W/PHACO Left 03/14/2022   Procedure: CATARACT EXTRACTION PHACO AND INTRAOCULAR LENS PLACEMENT (IOC) LEFT;  Surgeon: Nevada Crane, MD;  Location: [facility];  Service: Ophthalmology;  Laterality: Left;  4.32 0:39.7   CESAREAN SECTION     CHOLECYSTECTOMY     DILATION AND CURETTAGE OF UTERUS     ESOPHAGOGASTRODUODENOSCOPY N/A 02/22/2019   Procedure: ESOPHAGOGASTRODUODENOSCOPY (EGD);  Surgeon: Toledo, Boykin Nearing, MD;  Location: [facility];  Service: Gastroenterology;  Laterality: N/A;   FLEXIBLE SIGMOIDOSCOPY N/A 02/22/2019   Procedure: FLEXIBLE SIGMOIDOSCOPY;  Surgeon: Toledo, Boykin Nearing, MD;  Location: [facility];  Service: Gastroenterology;  Laterality: N/A;   HARDWARE REMOVAL Right 11/12/2019   Procedure: Right ankle hardware removal;  Surgeon: Kennedy Bucker, MD;  Location: [facility];   Service: Orthopedics;  Laterality: Right;   HARDWARE REMOVAL Right 04/17/2020   Procedure: HARDWARE REMOVAL FROM RIGHT ANKLE AND RIGHT KNEE; IMPLANT OF CEMENT SPACERS IN RIGHT KNEE;  Surgeon: Donato Heinz, MD;  Location: [facility];  Service: Orthopedics;  Laterality: Right;   JOINT REPLACEMENT     LEG AMPUTATION ABOVE KNEE Right 12/02/2020   [facility]   ORIF ANKLE FRACTURE Right 03/14/2019   Procedure: OPEN REDUCTION INTERNAL FIXATION (ORIF) ANKLE FRACTURE, MEDIAL MALLEOLUS;  Surgeon: Kennedy Bucker, MD;  Location: [facility];  Service: Orthopedics;  Laterality: Right;   ORIF ANKLE FRACTURE Right 02/08/2019   Procedure: OPEN REDUCTION INTERNAL FIXATION (ORIF) ANKLE FRACTURE, post malleolus;  Surgeon: Kennedy Bucker, MD;  Location: [facility];  Service: Orthopedics;  Laterality: Right;   PARTIAL HYSTERECTOMY  1993   REPLACEMENT TOTAL KNEE Bilateral 2008   2009   SCAR DEBRIDEMENT OF TOTAL KNEE  01/2020   SYNDESMOSIS REPAIR Right 02/08/2019   Procedure: SYNDESMOSIS REPAIR;  Surgeon: Kennedy Bucker, MD;  Location: [facility];  Service: Orthopedics;  Laterality: Right;   TEE WITHOUT CARDIOVERSION N/A 02/26/2019   Procedure: TRANSESOPHAGEAL ECHOCARDIOGRAM (TEE);  Surgeon: Lamar Blinks, MD;  Location: [facility];  Service: Cardiovascular;  Laterality: N/A;   TOTAL KNEE REVISION WITH SCAR DEBRIDEMENT/PATELLA REVISION WITH POLY EXCHANGE Right 01/24/2020   Procedure: TOTAL KNEE REVISION WITH SCAR DEBRIDEMENT/PATELLA REVISION WITH POLY EXCHANGE;  Surgeon: Francesco Sor  P, MD;  Location: [facility];  Service: Orthopedics;  Laterality: Right;   TUMOR REMOVAL     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   Patient Active Problem List   Diagnosis Date Noted   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   Anxiety 04/17/2020   History of GI diverticular bleed 04/17/2020   Chest pain 04/17/2020   PAD (peripheral artery disease) (HCC) 04/05/2020   Ulcer of right leg (HCC) 04/05/2020   COPD (chronic obstructive  pulmonary disease) (HCC) 04/05/2020   GERD (gastroesophageal reflux disease) 04/05/2020   Abscess of right knee 01/22/2020   Pressure injury of skin 02/28/2019   SOB (shortness of breath)    Gastrointestinal hemorrhage    Sepsis (HCC)    Symptomatic anemia 02/20/2019   Trimalleolar fracture of ankle, closed, right, initial encounter 02/07/2019   Multiple lung nodules 03/31/2014   Extrinsic asthma 03/27/2014   OSA on CPAP 03/27/2014    PCP: Danella Penton, MD  REFERRING PROVIDER: Danella Penton, MD  REFERRING DIAG: Above knee amputation of right lower extremity  THERAPY DIAG:  S/P AKA (above knee amputation) unilateral, right (HCC)  Muscle weakness (generalized)  Gait difficulty  Abnormal posture  Rationale for Evaluation and Treatment Rehabilitation  ONSET DATE: 12/02/20  SUBJECTIVE:  EVALUATION  PERTINENT HISTORY: Patient Profile:   Jasmine Morrow is a 77 y.o. female Chief Complaint  Patient presents with  Visit Follow Up  Doing well.   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 Cardiac cath 10/2011 showed completely normal coronary arteries, however she did have mild left ventricular dysfunction with anterior wall motion abnormality. Left ventricular EF was 51%.  History of chickenpox  Hyperlipidemia  patient states she has never been diagnosed with this  Hypertension  Knee  joint replacement by other means  Macular degeneration  Major depressive disorder, recurrent, mild (CMS-HCC)  Medicare annual wellness visit, initial 08/09/2019  7/21  Multiple thyroid nodules  Normal coronary arteries 09/06/2013  Normal coronary anatomy by cardiac catheterization 10/12/11  Osteoarthritis  Ovarian cyst  PONV (postoperative nausea and vomiting)  nausea  Postoperative urinary retention 12/08/2020  Rash on lips 12/21/2020  Seasonal allergies  Sinusitis, unspecified  Sleep apnea  On CPAP.  Slow transit constipation 12/10/2020  Trimalleolar fracture of ankle, closed, right, initial encounter 02/07/2019  Complicated by staph infection 1/21, 4 weeks IV Ancef, Menz  Ulcer   Past Surgical History:  Procedure Laterality Date  SALPINGO OOPHORECTOMY Bilateral 1993  Right total knee arthroplasty 10/11/2006  Dr Ernest Pine  Left total knee arthroplasty 10/12/2007  Dr Ernest Pine  COLONOSCOPY 07/30/2012  internal hemorrhoids, diverticulosis  ANKLE ARTHODESIS W/ ARTHROSCOPY Right 02/08/2019  03/14/2019 second surgery  COLONOSCOPY 02/22/2019  Blood in entire colon/Diverticulosis - Presumed diverticular bleed. No repeat recommended per TKT.  EGD 02/22/2019  Gastritis/gastric ulcers/Hiatal hernia/Otherwise normal - no repeat recommended per TKT.  ORIF ANKLE FRACTURE Right 03/14/2019  Dr. Rosita Kea  REMOVAL HARDWARE ANKLE FOOT/TOES Right 11/12/2019  Dr. Rosita Kea  Right knee arthrotomy, irrigation and debridement of the right knee, polyethylene exchange 01/24/2020  Dr Ernest Pine  Removal of hardware (plates and screws) from the right ankle with irrigation, debridement, and placement of Stimulan antibiotic beads 04/17/2020  Dr Ernest Pine  Right knee arthrotomy, extensive irrigation debridement, removal of right total knee implants, and placement of antibiotic impregnated polymethylmethacrylate cement spacer 04/17/2020  Dr Ernest Pine  AMPUTATION LEG ABOVE KNEE AKA Right 12/02/2020  Procedure: AMPUTATION,  THIGH, THROUGH FEMUR, ANY LEVEL; Surgeon: Starr Lake, MD; Location: [facility]; Service: Orthopedics; Laterality: Right;  TRANSFER ADJACENT TISSUE LEG Right 12/02/2020  Procedure: ADJACENT TISSUE TRANSFER OR REARRANGEMENT, LEG; DEFECT 10 SQ CM OR LESS; Surgeon: Starr Lake, MD; Location: [facility]; Service: Orthopedics; Laterality: Right;  ASPIRATION/INJECTION MAJOR JOINT/BURSA KNEE Left 12/02/2020  Procedure: ARTHROCENTESIS, ASPIRATION AND/OR INJECTION, MAJOR JOINT OR BURSA, KNEE; WITHOUT ULTRASOUND GUIDANCE; Surgeon: Starr Lake, MD; Location: [facility]; Service: Orthopedics; Laterality: Left;  REMOVAL KNEE PROSTHESIS W/POSSIBLE PLACEMENT SPACER Left 01/18/2021  Procedure: REMOVAL OF PROSTHESIS, INCLUDING TOTAL KNEE PROSTHESIS, METHYLMETHACRYLATE WITH OR WITHOUT INSERTION OF SPACER, KNEE; Surgeon: Celine Mans, MD; Location: [facility]; Service: Orthopedics; Laterality: Left;  INSERTION NON-BIODEGRADABLE DRUG DELIVERY IMPLANT Left 01/18/2021  Procedure: INSERTION, KNEE, bioresorbable, biodegradable, NON-BIODEGRADABLE DRUG DELIVERY IMPLANT; Surgeon: Celine Mans, MD; Location: [facility]; Service: Orthopedics; Laterality: Left;  ABOVE KNEE LEG AMPUTATION  12/02/2020  APPENDECTOMY Est 1968  CESAREAN SECTION  CHOLECYSTECTOMY  FRACTURE SURGERY 1/21  HYSTERECTOMY partial  JOINT REPLACEMENT 2008 & 2009  TUBAL LIGATION 3/79   ALLERGIES: Allergies  Allergen Reactions  Singulair [Montelukast] Other (See Comments)  Gland swelling  Sulfa (Sulfonamide Antibiotics) Swelling  Oxycodone Dizziness and Other (See Comments)  Vancomycin Analogues Other (See Comments)  Ringing in ears  Avinza [Morphine] Itching  Darvocet A500 [Propoxyphene N-Acetaminophen] Nausea  Nickel Rash  Ultracet [Tramadol-Acetaminophen] Rash  Vicodin [Hydrocodone-Acetaminophen] Vomiting  Vioxx [Rofecoxib] Other (See Comments)  GI   CURRENT  MEDICATIONS: Current Outpatient Medications: acetaminophen (TYLENOL) 500 MG tablet, Take 1,000 mg by mouth 3 (three) times a day Twice daily per patient., PRN Not Currently Taking aspirin 81 MG EC tablet, Take 1 tablet (81 mg total) by mouth at bedtime, Taking calcium carbonate-vitamin D3 (CALTRATE 600+D) 600 mg-10 mcg (400 unit) tablet, Take 0.5 tablets by mouth 2 (two) times daily, Taking cetirizine (ZYRTEC) 10 mg capsule, Take 1 capsule (10 mg total) by mouth once daily, Taking escitalopram oxalate (LEXAPRO) 10 MG tablet, Take 1 tablet (10 mg total) by mouth once daily, Taking estradioL (ESTRACE) 0.01 % (0.1 mg/gram) vaginal cream, Place 2 g vaginally twice a week, Taking folic acid/multivit,iron,miner (MULTIVIT-IRON-MIN-FOLIC ACID ORAL), Take 1 tablet by mouth once daily, Taking gabapentin (NEURONTIN) 300 MG capsule, Take 1 capsule (300 mg total) by mouth 3 (three) times daily Phantom limb pain, Taking melatonin 3 mg tablet, Take 1 tablet (3 mg total) by mouth at bedtime, Taking multivitamin with iron (COMPLETE MULTIVITAMIN-MINERAL) tablet, Take 1 tablet by mouth, Taking multivitamin with minerals, EYE, (PRESERVISION AREDS 2) soft gel capsule, Take 1 capsule by mouth 2 (two) times daily, Taking nystatin (MYCOSTATIN) 100,000 unit/gram powder, Apply small amount topically twice daily for rash under breasts, PRN Not Currently Taking pantoprazole (PROTONIX) 40 MG DR tablet, Take 1 tablet (40 mg total) by mouth 2 (two) times daily. Please call to schedule an appt. Thanks!, Taking polyethylene glycol (MIRALAX) powder, Take 17 g by mouth once daily Mix in 4-8ounces of fluid prior to taking., Taking sennosides-docusate (SENOKOT-S) 8.6-50 mg  tablet, Take 1 tablet by mouth 2 (two) times daily For constipation, Taking lactose-reduced food (ENSURE PLUS ORAL), Take 237 mLs by mouth 2 (two) times daily With breakfast and dinner  HPI   CLINICAL SUMMARY:  Patient post right AKA and working with prosthesis.  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 Able to do housework, improving  PAIN:  Are you having pain? Yes: NPRS scale: 5/10 Pain location: L knee Pain description: aching Aggravating factors: walking Relieving factors: rest  PRECAUTIONS: None  WEIGHT BEARING RESTRICTIONS No  FALLS:  Has patient fallen in last 6 months? No  LIVING ENVIRONMENT: Lives with: lives with their spouse Lives in: House/apartment Stairs: No Has following equipment at home: Dan Humphreys - 4 wheeled and Wheelchair (manual)  OCCUPATION: retired  PLOF: Requires assistive device for independence  PATIENT GOALS  improve standing tolerance/ pull up pants/ ambulate with SPC safely.    OBJECTIVE:   PATIENT SURVEYS:  FOTO initial 46/ goal 37.   11/2: 50  COGNITION:  Overall cognitive status: Within functional limits for tasks assessed     SENSATION: WFL  POSTURE: rounded shoulders, forward head, flexed trunk , and weight shift left  PALPATION: No tenderness along R distal residual limb.  Discussed phantom limb sensation.    LOWER EXTREMITY ROM:  B LE AROM WFL except L knee extension secondary to articulating spacer.  Did not assess L/R hip extension at this time.    LOWER EXTREMITY MMT:  MMT Right eval Left eval  Hip flexion 4/5 4/5  Hip extension    Hip abduction 4+/5 4+/5  Hip adduction 4+/5 4+/5  Hip internal rotation    Hip external rotation    Knee flexion N/A 4+/5  Knee extension N/A 4+/5  Ankle dorsiflexion  5/5  Ankle plantarflexion    Ankle inversion    Ankle eversion     (Blank rows = not tested)  GAIT: Distance walked: in gym/ //-bars Assistive device utilized: Environmental consultant - 2 wheeled Level of assistance: CGA Comments: Cuing for posture correction with mirror feedback.  Flexed posture/ heavy UE assist.     TODAY'S TREATMENT:  06/09/2022  Subjective:  Pt. Reports no new complaints.  Pt. Arrived to PT with husband and donned prosthetic leg in waiting  room. Pt. Ambulates with CGA from PT from waiting room to Nustep.     Objective:   There.ex.:  No charge   Nustep L5 10 min. L UE/B LE warm-up prior to gait.  Improvement in prosthetic fit after Nustep.    Neuro.mm.:  TUG: UE assist required to stand/ use of RW.  51.9 seconds.    Sit to stands from blue mat table 10x (varying heights)- wt. Shifting.   Standing tolerance with no UE assist 30 sec.             Gait training:    Amb. From waiting room to Nustep (around PT gym) with use of RW and focus on step through gait.  SBA/CGA on R to ensure R knee mechanism locks out before wt. Bearing.  Cuing to increase R LE step length while maintaining consistent BOS/ knee mechanism extension/ heel strike.  SBA/CGA for safety with gait.  PT adjusted/tightened fit several times during gait to Nustep.  No PT assist with knee mechanism today due to improved fit of leg.     Ascend/ descend 4 stairs x 1 with heavy UE assist and step to gait pattern.      Pt. Walks  outside of building from gym door to ramp/sidewalk with use of RW.  Pt. Required 1 seated rest break prior to walking back to gym/ w/c.  Pt. Then attempted to walk from gym to car but limited by B shoulder/ UE fatigue.      PATIENT EDUCATION:  Education details: HEP/ gait and prosthetic training.  Person educated: Patient and Spouse Education method: Explanation, Demonstration, and Verbal cues Education comprehension: verbalized understanding, returned demonstration, and tactile cues required   HOME EXERCISE PROGRAM: Prone position hip stretches/ Standing marching at kitchen counter with w/c behind patient.      ASSESSMENT:  CLINICAL IMPRESSION: Pt. Presents to PT in w/c and brought prosthetic leg.  Pt. Highly motivated to become more independent with use of RW while walking short distances in gym.  B UE/ shoulder fatigue with heavy UE assist with use of RW.  Pt. Limited with transfers due to B shoulder/ UE muscle fatigue and pain. Pts.  Prosthetic fit determines pts. ability to ambulate/ complete stairs with decrease assistance.  Pt. Walks outside today down ramp and over sidewalk with CGA and use of RW.  PT discussed the benefits of having a ramp at home.   Pt. Will benefit from skilled PT services to improve standing tolerance/ gait independence and ADL.    OBJECTIVE IMPAIRMENTS Abnormal gait, decreased activity tolerance, decreased balance, decreased endurance, decreased mobility, difficulty walking, decreased ROM, decreased strength, decreased safety awareness, impaired flexibility, improper body mechanics, prosthetic dependency , and pain.   ACTIVITY LIMITATIONS carrying, lifting, standing, squatting, stairs, transfers, bed mobility, bathing, toileting, dressing, hygiene/grooming, and locomotion level  PARTICIPATION LIMITATIONS: cleaning, laundry, driving, shopping, community activity, and yard work  PERSONAL FACTORS Past/current experiences are also affecting patient's functional outcome.   REHAB POTENTIAL: Good  CLINICAL DECISION MAKING: Evolving/moderate complexity  EVALUATION COMPLEXITY: High   GOALS: Goals reviewed with patient Yes  LONG TERM GOALS: Target date: 07/07/22  Pt. Will increase FOTO to 54 to improve functional mobility.  Baseline: initial FOTO 46.  11/2: 50.  4/25: 60  Goal status: Goal met  2.  Pt. Able to tolerate standing 10 minutes while pulling up pants with mod. I safely while wearing prosthetic leg to improve daily activities.  Baseline:  limited standing tolerance Goal status: Partially met  3.  Pt. Will ambulate 200 feet with use of SPC and mod. I to promote safety with household tasks/ walking into bathroom.   Baseline:  amb. With RW and min. A for safety.  2/27: walking in clinic with SBA/mod. I for 100 feet with RW and use of prosthetic leg  Goal status: Not met  4.  Pt. Will ascend 10 stairs with use of single forearm crutch/ handrail and step to pattern with mod. I safely.   Baseline:  see above Goal status: Not met    PLAN: PT FREQUENCY: 2x/week  PT DURATION: 6 weeks  PLANNED INTERVENTIONS: Therapeutic exercises, Therapeutic activity, Neuromuscular re-education, Balance training, Gait training, Patient/Family education, Self Care, Joint mobilization, Prosthetic training, DME instructions, and Manual therapy  PLAN FOR NEXT SESSION:  Progress gait/ standing tolerance.    Cammie Mcgee, PT, DPT # [phone] 1:40 PM,06/18/22

## 2022-06-21 ENCOUNTER — Ambulatory Visit: Payer: Medicare Other | Admitting: Physical Therapy

## 2022-06-23 ENCOUNTER — Ambulatory Visit: Payer: Medicare Other | Admitting: Physical Therapy

## 2022-06-28 ENCOUNTER — Ambulatory Visit: Payer: Medicare Other | Admitting: Physical Therapy

## 2022-06-28 DIAGNOSIS — M6281 Muscle weakness (generalized): Secondary | ICD-10-CM

## 2022-06-28 DIAGNOSIS — Z89611 Acquired absence of right leg above knee: Secondary | ICD-10-CM | POA: Diagnosis not present

## 2022-06-28 DIAGNOSIS — R293 Abnormal posture: Secondary | ICD-10-CM

## 2022-06-28 DIAGNOSIS — R269 Unspecified abnormalities of gait and mobility: Secondary | ICD-10-CM

## 2022-06-29 NOTE — Therapy (Addendum)
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   Hypertension    PONV (postoperative nausea and vomiting)    Seasonal allergies    Sleep apnea    does not uses CPAP machine anymore    Wheelchair dependent    able to self transfer   Past Surgical History:  Procedure Laterality Date   APPENDECTOMY     CATARACT EXTRACTION W/PHACO Right 02/28/2022   Procedure: CATARACT EXTRACTION PHACO AND INTRAOCULAR LENS PLACEMENT (IOC) RIGHT  4.82  00:30.9;  Surgeon: Nevada Crane, MD;  Location: [facility];  Service: Ophthalmology;  Laterality: Right;  sleep apnea   CATARACT EXTRACTION W/PHACO Left 03/14/2022   Procedure: CATARACT EXTRACTION PHACO AND INTRAOCULAR LENS PLACEMENT (IOC) LEFT;  Surgeon: Nevada Crane, MD;  Location: [facility];  Service: Ophthalmology;  Laterality: Left;  4.32 0:39.7   CESAREAN SECTION     CHOLECYSTECTOMY     DILATION AND CURETTAGE OF UTERUS     ESOPHAGOGASTRODUODENOSCOPY N/A 02/22/2019   Procedure: ESOPHAGOGASTRODUODENOSCOPY (EGD);  Surgeon: Toledo, Boykin Nearing, MD;  Location: [facility];  Service: Gastroenterology;  Laterality: N/A;   FLEXIBLE SIGMOIDOSCOPY N/A 02/22/2019   Procedure: FLEXIBLE SIGMOIDOSCOPY;  Surgeon: Toledo, Boykin Nearing, MD;  Location: [facility];  Service: Gastroenterology;  Laterality: N/A;   HARDWARE REMOVAL Right 11/12/2019   Procedure: Right ankle hardware removal;  Surgeon: Kennedy Bucker, MD;  Location: [facility];   Service: Orthopedics;  Laterality: Right;   HARDWARE REMOVAL Right 04/17/2020   Procedure: HARDWARE REMOVAL FROM RIGHT ANKLE AND RIGHT KNEE; IMPLANT OF CEMENT SPACERS IN RIGHT KNEE;  Surgeon: Donato Heinz, MD;  Location: [facility];  Service: Orthopedics;  Laterality: Right;   JOINT REPLACEMENT     LEG AMPUTATION ABOVE KNEE Right 12/02/2020   [facility]   ORIF ANKLE FRACTURE Right 03/14/2019   Procedure: OPEN REDUCTION INTERNAL FIXATION (ORIF) ANKLE FRACTURE, MEDIAL MALLEOLUS;  Surgeon: Kennedy Bucker, MD;  Location: [facility];  Service: Orthopedics;  Laterality: Right;   ORIF ANKLE FRACTURE Right 02/08/2019   Procedure: OPEN REDUCTION INTERNAL FIXATION (ORIF) ANKLE FRACTURE, post malleolus;  Surgeon: Kennedy Bucker, MD;  Location: [facility];  Service: Orthopedics;  Laterality: Right;   PARTIAL HYSTERECTOMY  1993   REPLACEMENT TOTAL KNEE Bilateral 2008   2009   SCAR DEBRIDEMENT OF TOTAL KNEE  01/2020   SYNDESMOSIS REPAIR Right 02/08/2019   Procedure: SYNDESMOSIS REPAIR;  Surgeon: Kennedy Bucker, MD;  Location: [facility];  Service: Orthopedics;  Laterality: Right;   TEE WITHOUT CARDIOVERSION N/A 02/26/2019   Procedure: TRANSESOPHAGEAL ECHOCARDIOGRAM (TEE);  Surgeon: Lamar Blinks, MD;  Location: [facility];  Service: Cardiovascular;  Laterality: N/A;   TOTAL KNEE REVISION WITH SCAR DEBRIDEMENT/PATELLA REVISION WITH POLY EXCHANGE Right 01/24/2020   Procedure: TOTAL KNEE REVISION WITH SCAR DEBRIDEMENT/PATELLA REVISION WITH POLY EXCHANGE;  Surgeon: Francesco Sor  P, MD;  Location: [facility];  Service: Orthopedics;  Laterality: Right;   TUMOR REMOVAL     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   Patient Active Problem List   Diagnosis Date Noted   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   Anxiety 04/17/2020   History of GI diverticular bleed 04/17/2020   Chest pain 04/17/2020   PAD (peripheral artery disease) (HCC) 04/05/2020   Ulcer of right leg (HCC) 04/05/2020   COPD (chronic obstructive  pulmonary disease) (HCC) 04/05/2020   GERD (gastroesophageal reflux disease) 04/05/2020   Abscess of right knee 01/22/2020   Pressure injury of skin 02/28/2019   SOB (shortness of breath)    Gastrointestinal hemorrhage    Sepsis (HCC)    Symptomatic anemia 02/20/2019   Trimalleolar fracture of ankle, closed, right, initial encounter 02/07/2019   Multiple lung nodules 03/31/2014   Extrinsic asthma 03/27/2014   OSA on CPAP 03/27/2014    PCP: Danella Penton, MD  REFERRING PROVIDER: Danella Penton, MD  REFERRING DIAG: Above knee amputation of right lower extremity  THERAPY DIAG:  S/P AKA (above knee amputation) unilateral, right (HCC)  Muscle weakness (generalized)  Gait difficulty  Abnormal posture  Rationale for Evaluation and Treatment Rehabilitation  ONSET DATE: 12/02/20  SUBJECTIVE:  EVALUATION  PERTINENT HISTORY: Patient Profile:   Jasmine Morrow is a 77 y.o. female Chief Complaint  Patient presents with  Visit Follow Up  Doing well.   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 Cardiac cath 10/2011 showed completely normal coronary arteries, however she did have mild left ventricular dysfunction with anterior wall motion abnormality. Left ventricular EF was 51%.  History of chickenpox  Hyperlipidemia  patient states she has never been diagnosed with this  Hypertension  Knee  joint replacement by other means  Macular degeneration  Major depressive disorder, recurrent, mild (CMS-HCC)  Medicare annual wellness visit, initial 08/09/2019  7/21  Multiple thyroid nodules  Normal coronary arteries 09/06/2013  Normal coronary anatomy by cardiac catheterization 10/12/11  Osteoarthritis  Ovarian cyst  PONV (postoperative nausea and vomiting)  nausea  Postoperative urinary retention 12/08/2020  Rash on lips 12/21/2020  Seasonal allergies  Sinusitis, unspecified  Sleep apnea  On CPAP.  Slow transit constipation 12/10/2020  Trimalleolar fracture of ankle, closed, right, initial encounter 02/07/2019  Complicated by staph infection 1/21, 4 weeks IV Ancef, Menz  Ulcer   Past Surgical History:  Procedure Laterality Date  SALPINGO OOPHORECTOMY Bilateral 1993  Right total knee arthroplasty 10/11/2006  Dr Ernest Pine  Left total knee arthroplasty 10/12/2007  Dr Ernest Pine  COLONOSCOPY 07/30/2012  internal hemorrhoids, diverticulosis  ANKLE ARTHODESIS W/ ARTHROSCOPY Right 02/08/2019  03/14/2019 second surgery  COLONOSCOPY 02/22/2019  Blood in entire colon/Diverticulosis - Presumed diverticular bleed. No repeat recommended per TKT.  EGD 02/22/2019  Gastritis/gastric ulcers/Hiatal hernia/Otherwise normal - no repeat recommended per TKT.  ORIF ANKLE FRACTURE Right 03/14/2019  Dr. Rosita Kea  REMOVAL HARDWARE ANKLE FOOT/TOES Right 11/12/2019  Dr. Rosita Kea  Right knee arthrotomy, irrigation and debridement of the right knee, polyethylene exchange 01/24/2020  Dr Ernest Pine  Removal of hardware (plates and screws) from the right ankle with irrigation, debridement, and placement of Stimulan antibiotic beads 04/17/2020  Dr Ernest Pine  Right knee arthrotomy, extensive irrigation debridement, removal of right total knee implants, and placement of antibiotic impregnated polymethylmethacrylate cement spacer 04/17/2020  Dr Ernest Pine  AMPUTATION LEG ABOVE KNEE AKA Right 12/02/2020  Procedure: AMPUTATION,  THIGH, THROUGH FEMUR, ANY LEVEL; Surgeon: Starr Lake, MD; Location: [facility]; Service: Orthopedics; Laterality: Right;  TRANSFER ADJACENT TISSUE LEG Right 12/02/2020  Procedure: ADJACENT TISSUE TRANSFER OR REARRANGEMENT, LEG; DEFECT 10 SQ CM OR LESS; Surgeon: Starr Lake, MD; Location: [facility]; Service: Orthopedics; Laterality: Right;  ASPIRATION/INJECTION MAJOR JOINT/BURSA KNEE Left 12/02/2020  Procedure: ARTHROCENTESIS, ASPIRATION AND/OR INJECTION, MAJOR JOINT OR BURSA, KNEE; WITHOUT ULTRASOUND GUIDANCE; Surgeon: Starr Lake, MD; Location: [facility]; Service: Orthopedics; Laterality: Left;  REMOVAL KNEE PROSTHESIS W/POSSIBLE PLACEMENT SPACER Left 01/18/2021  Procedure: REMOVAL OF PROSTHESIS, INCLUDING TOTAL KNEE PROSTHESIS, METHYLMETHACRYLATE WITH OR WITHOUT INSERTION OF SPACER, KNEE; Surgeon: Celine Mans, MD; Location: [facility]; Service: Orthopedics; Laterality: Left;  INSERTION NON-BIODEGRADABLE DRUG DELIVERY IMPLANT Left 01/18/2021  Procedure: INSERTION, KNEE, bioresorbable, biodegradable, NON-BIODEGRADABLE DRUG DELIVERY IMPLANT; Surgeon: Celine Mans, MD; Location: [facility]; Service: Orthopedics; Laterality: Left;  ABOVE KNEE LEG AMPUTATION  12/02/2020  APPENDECTOMY Est 1968  CESAREAN SECTION  CHOLECYSTECTOMY  FRACTURE SURGERY 1/21  HYSTERECTOMY partial  JOINT REPLACEMENT 2008 & 2009  TUBAL LIGATION 3/79   ALLERGIES: Allergies  Allergen Reactions  Singulair [Montelukast] Other (See Comments)  Gland swelling  Sulfa (Sulfonamide Antibiotics) Swelling  Oxycodone Dizziness and Other (See Comments)  Vancomycin Analogues Other (See Comments)  Ringing in ears  Avinza [Morphine] Itching  Darvocet A500 [Propoxyphene N-Acetaminophen] Nausea  Nickel Rash  Ultracet [Tramadol-Acetaminophen] Rash  Vicodin [Hydrocodone-Acetaminophen] Vomiting  Vioxx [Rofecoxib] Other (See Comments)  GI   CURRENT  MEDICATIONS: Current Outpatient Medications: acetaminophen (TYLENOL) 500 MG tablet, Take 1,000 mg by mouth 3 (three) times a day Twice daily per patient., PRN Not Currently Taking aspirin 81 MG EC tablet, Take 1 tablet (81 mg total) by mouth at bedtime, Taking calcium carbonate-vitamin D3 (CALTRATE 600+D) 600 mg-10 mcg (400 unit) tablet, Take 0.5 tablets by mouth 2 (two) times daily, Taking cetirizine (ZYRTEC) 10 mg capsule, Take 1 capsule (10 mg total) by mouth once daily, Taking escitalopram oxalate (LEXAPRO) 10 MG tablet, Take 1 tablet (10 mg total) by mouth once daily, Taking estradioL (ESTRACE) 0.01 % (0.1 mg/gram) vaginal cream, Place 2 g vaginally twice a week, Taking folic acid/multivit,iron,miner (MULTIVIT-IRON-MIN-FOLIC ACID ORAL), Take 1 tablet by mouth once daily, Taking gabapentin (NEURONTIN) 300 MG capsule, Take 1 capsule (300 mg total) by mouth 3 (three) times daily Phantom limb pain, Taking melatonin 3 mg tablet, Take 1 tablet (3 mg total) by mouth at bedtime, Taking multivitamin with iron (COMPLETE MULTIVITAMIN-MINERAL) tablet, Take 1 tablet by mouth, Taking multivitamin with minerals, EYE, (PRESERVISION AREDS 2) soft gel capsule, Take 1 capsule by mouth 2 (two) times daily, Taking nystatin (MYCOSTATIN) 100,000 unit/gram powder, Apply small amount topically twice daily for rash under breasts, PRN Not Currently Taking pantoprazole (PROTONIX) 40 MG DR tablet, Take 1 tablet (40 mg total) by mouth 2 (two) times daily. Please call to schedule an appt. Thanks!, Taking polyethylene glycol (MIRALAX) powder, Take 17 g by mouth once daily Mix in 4-8ounces of fluid prior to taking., Taking sennosides-docusate (SENOKOT-S) 8.6-50 mg  tablet, Take 1 tablet by mouth 2 (two) times daily For constipation, Taking lactose-reduced food (ENSURE PLUS ORAL), Take 237 mLs by mouth 2 (two) times daily With breakfast and dinner  HPI   CLINICAL SUMMARY:  Patient post right AKA and working with prosthesis.  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 Able to do housework, improving  PAIN:  Are you having pain? Yes: NPRS scale: 5/10 Pain location: L knee Pain description: aching Aggravating factors: walking Relieving factors: rest  PRECAUTIONS: None  WEIGHT BEARING RESTRICTIONS No  FALLS:  Has patient fallen in last 6 months? No  LIVING ENVIRONMENT: Lives with: lives with their spouse Lives in: House/apartment Stairs: No Has following equipment at home: Dan Humphreys - 4 wheeled and Wheelchair (manual)  OCCUPATION: retired  PLOF: Requires assistive device for independence  PATIENT GOALS  improve standing tolerance/ pull up pants/ ambulate with SPC safely.    OBJECTIVE:   PATIENT SURVEYS:  FOTO initial 46/ goal 39.   11/2: 50  COGNITION:  Overall cognitive status: Within functional limits for tasks assessed     SENSATION: WFL  POSTURE: rounded shoulders, forward head, flexed trunk , and weight shift left  PALPATION: No tenderness along R distal residual limb.  Discussed phantom limb sensation.    LOWER EXTREMITY ROM:  B LE AROM WFL except L knee extension secondary to articulating spacer.  Did not assess L/R hip extension at this time.    LOWER EXTREMITY MMT:  MMT Right eval Left eval  Hip flexion 4/5 4/5  Hip extension    Hip abduction 4+/5 4+/5  Hip adduction 4+/5 4+/5  Hip internal rotation    Hip external rotation    Knee flexion N/A 4+/5  Knee extension N/A 4+/5  Ankle dorsiflexion  5/5  Ankle plantarflexion    Ankle inversion    Ankle eversion     (Blank rows = not tested)  GAIT: Distance walked: in gym/ //-bars Assistive device utilized: Environmental consultant - 2 wheeled Level of assistance: CGA Comments: Cuing for posture correction with mirror feedback.  Flexed posture/ heavy UE assist.    TUG: UE assist required to stand/ use of RW.  51.9 seconds.     TODAY'S TREATMENT:  06/28/2022  Subjective:  Pt. Arrived to PT with husband and  donned prosthetic leg on blue mat table.  Pts. Fit of prosthetic leg was excellent today and has 2 inches extra at end of strap.  Pt. States she has been wearing leg more at home.    Objective:   There.ex.:  No charge   Nustep L5 10 min. L UE/B LE warm-up prior to gait.  Improvement in prosthetic fit after Nustep.    Neuro.mm.:  Sit to stands from blue mat table 4x (varying heights)- wt. Shifting.   Sit to stands from gray chair at //-bars with focus on pushing up from arm rests instead of pulling up on //-bars.    Forward/ backwards walking with increase step length while maintaining proper BOS.  Mirror feedback for posture correction.              Gait training:    Amb. From blue mat table to Nustep (around PT gym) with use of RW and focus on step through gait.  SBA/CGA on R to ensure R knee mechanism locks out before wt. Bearing.  No verbal cuing required and consistent gait pattern/ step pattern.  PT adjusted/tightened fit several times during gait to Nustep.    No  stairs today.  Ambulate in //-bars/ hallway with use of RW working on endurance/ consistent step pattern/ heel strike.  No episodes of R knee mechanism buckling during tx. Today.  Pt. Fatigued, esp. In B shoulders/ UE while using RW and //-bars.  Amb. With use of [place] on L side with consistent gait pattern in //-bars (R UE on //-bars)- 3 laps.    PATIENT EDUCATION:  Education details: HEP/ gait and prosthetic training.  Person educated: Patient and Spouse Education method: Explanation, Demonstration, and Verbal cues Education comprehension: verbalized understanding, returned demonstration, and tactile cues required   HOME EXERCISE PROGRAM: Prone position hip stretches/ Standing marching at kitchen counter with w/c behind patient.      ASSESSMENT:  CLINICAL IMPRESSION: Pt. Presents to PT in w/c and brought prosthetic leg.  Pt. Highly motivated to become more independent with use of RW while walking short  distances in gym.  Pt. Ambulates with excellent technique/ BOS/ step pattern and no episodes of R knee mechanism buckling.   B UE/ shoulder fatigue with heavy UE assist with use of RW.  Pt. May benefit from tall RW for home use/ posture correction.  Pt. Will benefit from skilled PT services to improve standing tolerance/ gait independence and ADL.    OBJECTIVE IMPAIRMENTS Abnormal gait, decreased activity tolerance, decreased balance, decreased endurance, decreased mobility, difficulty walking, decreased ROM, decreased strength, decreased safety awareness, impaired flexibility, improper body mechanics, prosthetic dependency , and pain.   ACTIVITY LIMITATIONS carrying, lifting, standing, squatting, stairs, transfers, bed mobility, bathing, toileting, dressing, hygiene/grooming, and locomotion level  PARTICIPATION LIMITATIONS: cleaning, laundry, driving, shopping, community activity, and yard work  PERSONAL FACTORS Past/current experiences are also affecting patient's functional outcome.   REHAB POTENTIAL: Good  CLINICAL DECISION MAKING: Evolving/moderate complexity  EVALUATION COMPLEXITY: High   GOALS: Goals reviewed with patient Yes  LONG TERM GOALS: Target date: 07/07/22  Pt. Will increase FOTO to 54 to improve functional mobility.  Baseline: initial FOTO 46.  11/2: 50.  4/25: 60  Goal status: Goal met  2.  Pt. Able to tolerate standing 10 minutes while pulling up pants with mod. I safely while wearing prosthetic leg to improve daily activities.  Baseline:  limited standing tolerance Goal status: Partially met  3.  Pt. Will ambulate 200 feet with use of SPC and mod. I to promote safety with household tasks/ walking into bathroom.   Baseline:  amb. With RW and min. A for safety.  2/27: walking in clinic with SBA/mod. I for 100 feet with RW and use of prosthetic leg  Goal status: Not met  4.  Pt. Will ascend 10 stairs with use of single forearm crutch/ handrail and step to pattern with  mod. I safely.  Baseline:  see above Goal status: Not met    PLAN: PT FREQUENCY: 2x/week  PT DURATION: 6 weeks  PLANNED INTERVENTIONS: Therapeutic exercises, Therapeutic activity, Neuromuscular re-education, Balance training, Gait training, Patient/Family education, Self Care, Joint mobilization, Prosthetic training, DME instructions, and Manual therapy  PLAN FOR NEXT SESSION:  Progress gait/ standing tolerance.  Write MD order for tall RW/ w/c prior to [facility] home visit.   Cammie Mcgee, PT, DPT # [phone] 6:09 PM,06/29/22

## 2022-06-30 ENCOUNTER — Ambulatory Visit: Payer: Medicare Other | Admitting: Physical Therapy

## 2022-07-05 ENCOUNTER — Ambulatory Visit: Payer: Medicare Other | Attending: Internal Medicine | Admitting: Physical Therapy

## 2022-07-05 ENCOUNTER — Encounter: Payer: Self-pay | Admitting: Physical Therapy

## 2022-07-05 DIAGNOSIS — M6281 Muscle weakness (generalized): Secondary | ICD-10-CM | POA: Diagnosis present

## 2022-07-05 DIAGNOSIS — Z89611 Acquired absence of right leg above knee: Secondary | ICD-10-CM | POA: Diagnosis present

## 2022-07-05 DIAGNOSIS — R269 Unspecified abnormalities of gait and mobility: Secondary | ICD-10-CM | POA: Insufficient documentation

## 2022-07-05 DIAGNOSIS — R293 Abnormal posture: Secondary | ICD-10-CM | POA: Insufficient documentation

## 2022-07-05 NOTE — Therapy (Signed)
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   Hypertension    PONV (postoperative nausea and vomiting)    Seasonal allergies    Sleep apnea    does not uses CPAP machine anymore    Wheelchair dependent    able to self transfer   Past Surgical History:  Procedure Laterality Date   APPENDECTOMY     CATARACT EXTRACTION W/PHACO Right 02/28/2022   Procedure: CATARACT EXTRACTION PHACO AND INTRAOCULAR LENS PLACEMENT (IOC) RIGHT  4.82  00:30.9;  Surgeon: Nevada Crane, MD;  Location: [facility];  Service: Ophthalmology;  Laterality: Right;  sleep apnea   CATARACT EXTRACTION W/PHACO Left 03/14/2022   Procedure: CATARACT EXTRACTION PHACO AND INTRAOCULAR LENS PLACEMENT (IOC) LEFT;  Surgeon: Nevada Crane, MD;  Location: [facility];  Service: Ophthalmology;  Laterality: Left;  4.32 0:39.7   CESAREAN SECTION     CHOLECYSTECTOMY     DILATION AND CURETTAGE OF UTERUS     ESOPHAGOGASTRODUODENOSCOPY N/A 02/22/2019   Procedure: ESOPHAGOGASTRODUODENOSCOPY (EGD);  Surgeon: Toledo, Boykin Nearing, MD;  Location: [facility];  Service: Gastroenterology;  Laterality: N/A;   FLEXIBLE SIGMOIDOSCOPY N/A 02/22/2019   Procedure: FLEXIBLE SIGMOIDOSCOPY;  Surgeon: Toledo, Boykin Nearing, MD;  Location: [facility];  Service: Gastroenterology;  Laterality: N/A;   HARDWARE REMOVAL Right 11/12/2019    Procedure: Right ankle hardware removal;  Surgeon: Kennedy Bucker, MD;  Location: [facility];  Service: Orthopedics;  Laterality: Right;   HARDWARE REMOVAL Right 04/17/2020   Procedure: HARDWARE REMOVAL FROM RIGHT ANKLE AND RIGHT KNEE; IMPLANT OF CEMENT SPACERS IN RIGHT KNEE;  Surgeon: Donato Heinz, MD;  Location: [facility];  Service: Orthopedics;  Laterality: Right;   JOINT REPLACEMENT     LEG AMPUTATION ABOVE KNEE Right 12/02/2020   [facility]   ORIF ANKLE FRACTURE Right 03/14/2019   Procedure: OPEN REDUCTION INTERNAL FIXATION (ORIF) ANKLE FRACTURE, MEDIAL MALLEOLUS;  Surgeon: Kennedy Bucker, MD;  Location: [facility];  Service: Orthopedics;  Laterality: Right;   ORIF ANKLE FRACTURE Right 02/08/2019   Procedure: OPEN REDUCTION INTERNAL FIXATION (ORIF) ANKLE FRACTURE, post malleolus;  Surgeon: Kennedy Bucker, MD;  Location: [facility];  Service: Orthopedics;  Laterality: Right;   PARTIAL HYSTERECTOMY  1993   REPLACEMENT TOTAL KNEE Bilateral 2008   2009   SCAR DEBRIDEMENT OF TOTAL KNEE  01/2020   SYNDESMOSIS REPAIR Right 02/08/2019   Procedure: SYNDESMOSIS REPAIR;  Surgeon: Kennedy Bucker, MD;  Location: [facility];  Service: Orthopedics;  Laterality: Right;   TEE WITHOUT CARDIOVERSION N/A 02/26/2019   Procedure: TRANSESOPHAGEAL ECHOCARDIOGRAM (TEE);  Surgeon: Lamar Blinks, MD;  Location: [facility];  Service: Cardiovascular;  Laterality: N/A;   TOTAL KNEE REVISION WITH SCAR DEBRIDEMENT/PATELLA REVISION WITH POLY EXCHANGE Right 01/24/2020  Procedure: TOTAL KNEE REVISION WITH SCAR DEBRIDEMENT/PATELLA REVISION WITH POLY EXCHANGE;  Surgeon: Donato Heinz, MD;  Location: [facility];  Service: Orthopedics;  Laterality: Right;   TUMOR REMOVAL     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   Patient Active Problem List   Diagnosis Date Noted   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   Anxiety 04/17/2020   History of GI diverticular bleed 04/17/2020   Chest pain 04/17/2020   PAD (peripheral artery  disease) (HCC) 04/05/2020   Ulcer of right leg (HCC) 04/05/2020   COPD (chronic obstructive pulmonary disease) (HCC) 04/05/2020   GERD (gastroesophageal reflux disease) 04/05/2020   Abscess of right knee 01/22/2020   Pressure injury of skin 02/28/2019   SOB (shortness of breath)    Gastrointestinal hemorrhage    Sepsis (HCC)    Symptomatic anemia 02/20/2019   Trimalleolar fracture of ankle, closed, right, initial encounter 02/07/2019   Multiple lung nodules 03/31/2014   Extrinsic asthma 03/27/2014   OSA on CPAP 03/27/2014    PCP: Danella Penton, MD  REFERRING PROVIDER: Danella Penton, MD  REFERRING DIAG: Above knee amputation of right lower extremity  THERAPY DIAG:  S/P AKA (above knee amputation) unilateral, right (HCC)  Muscle weakness (generalized)  Gait difficulty  Abnormal posture  Rationale for Evaluation and Treatment Rehabilitation  ONSET DATE: 12/02/20  SUBJECTIVE:  EVALUATION  PERTINENT HISTORY: Patient Profile:   Jasmine Morrow is a 77 y.o. female Chief Complaint  Patient presents with  Visit Follow Up  Doing well.   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 Cardiac cath 10/2011 showed completely normal coronary arteries, however she did have mild left ventricular dysfunction with anterior wall motion abnormality. Left ventricular EF was 51%.  History of chickenpox   Hyperlipidemia  patient states she has never been diagnosed with this  Hypertension  Knee joint replacement by other means  Macular degeneration  Major depressive disorder, recurrent, mild (CMS-HCC)  Medicare annual wellness visit, initial 08/09/2019  7/21  Multiple thyroid nodules  Normal coronary arteries 09/06/2013  Normal coronary anatomy by cardiac catheterization 10/12/11  Osteoarthritis  Ovarian cyst  PONV (postoperative nausea and vomiting)  nausea  Postoperative urinary retention 12/08/2020  Rash on lips 12/21/2020  Seasonal allergies  Sinusitis, unspecified  Sleep apnea  On CPAP.  Slow transit constipation 12/10/2020  Trimalleolar fracture of ankle, closed, right, initial encounter 02/07/2019  Complicated by staph infection 1/21, 4 weeks IV Ancef, Menz  Ulcer   Past Surgical History:  Procedure Laterality Date  SALPINGO OOPHORECTOMY Bilateral 1993  Right total knee arthroplasty 10/11/2006  Dr Ernest Pine  Left total knee arthroplasty 10/12/2007  Dr Ernest Pine  COLONOSCOPY 07/30/2012  internal hemorrhoids, diverticulosis  ANKLE ARTHODESIS W/ ARTHROSCOPY Right 02/08/2019  03/14/2019 second surgery  COLONOSCOPY 02/22/2019  Blood in entire colon/Diverticulosis - Presumed diverticular bleed. No repeat recommended per TKT.  EGD 02/22/2019  Gastritis/gastric ulcers/Hiatal hernia/Otherwise normal - no repeat recommended per TKT.  ORIF ANKLE FRACTURE  Right 03/14/2019  Dr. Rosita Kea  REMOVAL HARDWARE ANKLE FOOT/TOES Right 11/12/2019  Dr. Rosita Kea  Right knee arthrotomy, irrigation and debridement of the right knee, polyethylene exchange 01/24/2020  Dr Ernest Pine  Removal of hardware (plates and screws) from the right ankle with irrigation, debridement, and placement of Stimulan antibiotic beads 04/17/2020  Dr Ernest Pine  Right knee arthrotomy, extensive irrigation debridement, removal of right total knee implants, and placement of antibiotic impregnated polymethylmethacrylate cement spacer  04/17/2020  Dr Ernest Pine  AMPUTATION LEG ABOVE KNEE AKA Right 12/02/2020  Procedure: AMPUTATION, THIGH, THROUGH FEMUR, ANY LEVEL; Surgeon: Starr Lake, MD; Location: [facility]; Service: Orthopedics; Laterality: Right;  TRANSFER ADJACENT TISSUE LEG Right 12/02/2020  Procedure: ADJACENT TISSUE TRANSFER OR REARRANGEMENT, LEG; DEFECT 10 SQ CM OR LESS; Surgeon: Starr Lake, MD; Location: [facility]; Service: Orthopedics; Laterality: Right;  ASPIRATION/INJECTION MAJOR JOINT/BURSA KNEE Left 12/02/2020  Procedure: ARTHROCENTESIS, ASPIRATION AND/OR INJECTION, MAJOR JOINT OR BURSA, KNEE; WITHOUT ULTRASOUND GUIDANCE; Surgeon: Starr Lake, MD; Location: [facility]; Service: Orthopedics; Laterality: Left;  REMOVAL KNEE PROSTHESIS W/POSSIBLE PLACEMENT SPACER Left 01/18/2021  Procedure: REMOVAL OF PROSTHESIS, INCLUDING TOTAL KNEE PROSTHESIS, METHYLMETHACRYLATE WITH OR WITHOUT INSERTION OF SPACER, KNEE; Surgeon: Celine Mans, MD; Location: [facility]; Service: Orthopedics; Laterality: Left;  INSERTION NON-BIODEGRADABLE DRUG DELIVERY IMPLANT Left 01/18/2021  Procedure: INSERTION, KNEE, bioresorbable, biodegradable, NON-BIODEGRADABLE DRUG DELIVERY IMPLANT; Surgeon: Celine Mans, MD; Location: [facility]; Service: Orthopedics; Laterality: Left;  ABOVE KNEE LEG AMPUTATION  12/02/2020  APPENDECTOMY Est 1968  CESAREAN SECTION  CHOLECYSTECTOMY  FRACTURE SURGERY 1/21  HYSTERECTOMY partial  JOINT REPLACEMENT 2008 & 2009  TUBAL LIGATION 3/79   ALLERGIES: Allergies  Allergen Reactions  Singulair [Montelukast] Other (See Comments)  Gland swelling  Sulfa (Sulfonamide Antibiotics) Swelling  Oxycodone Dizziness and Other (See Comments)  Vancomycin Analogues Other (See Comments)  Ringing in ears  Avinza [Morphine] Itching  Darvocet A500 [Propoxyphene N-Acetaminophen] Nausea  Nickel Rash  Ultracet [Tramadol-Acetaminophen] Rash  Vicodin  [Hydrocodone-Acetaminophen] Vomiting  Vioxx [Rofecoxib] Other (See Comments)  GI   CURRENT MEDICATIONS: Current Outpatient Medications: acetaminophen (TYLENOL) 500 MG tablet, Take 1,000 mg by mouth 3 (three) times a day Twice daily per patient., PRN Not Currently Taking aspirin 81 MG EC tablet, Take 1 tablet (81 mg total) by mouth at bedtime, Taking calcium carbonate-vitamin D3 (CALTRATE 600+D) 600 mg-10 mcg (400 unit) tablet, Take 0.5 tablets by mouth 2 (two) times daily, Taking cetirizine (ZYRTEC) 10 mg capsule, Take 1 capsule (10 mg total) by mouth once daily, Taking escitalopram oxalate (LEXAPRO) 10 MG tablet, Take 1 tablet (10 mg total) by mouth once daily, Taking estradioL (ESTRACE) 0.01 % (0.1 mg/gram) vaginal cream, Place 2 g vaginally twice a week, Taking folic acid/multivit,iron,miner (MULTIVIT-IRON-MIN-FOLIC ACID ORAL), Take 1 tablet by mouth once daily, Taking gabapentin (NEURONTIN) 300 MG capsule, Take 1 capsule (300 mg total) by mouth 3 (three) times daily Phantom limb pain, Taking melatonin 3 mg tablet, Take 1 tablet (3 mg total) by mouth at bedtime, Taking multivitamin with iron (COMPLETE MULTIVITAMIN-MINERAL) tablet, Take 1 tablet by mouth, Taking multivitamin with minerals, EYE, (PRESERVISION AREDS 2) soft gel capsule, Take 1 capsule by mouth 2 (two) times daily, Taking nystatin (MYCOSTATIN) 100,000 unit/gram powder, Apply small amount topically twice daily for rash under breasts, PRN Not Currently Taking pantoprazole (PROTONIX) 40 MG DR tablet, Take 1 tablet (40 mg total) by mouth 2 (two) times daily. Please call to schedule an appt. Thanks!, Taking polyethylene glycol (MIRALAX) powder, Take 17 g by  mouth once daily Mix in 4-8ounces of fluid prior to taking., Taking sennosides-docusate (SENOKOT-S) 8.6-50 mg tablet, Take 1 tablet by mouth 2 (two) times daily For constipation, Taking lactose-reduced food (ENSURE PLUS ORAL), Take 237 mLs by mouth 2 (two) times daily With breakfast  and dinner  HPI   CLINICAL SUMMARY:  Patient post right AKA and working with prosthesis. 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 Able to do housework, improving  PAIN:  Are you having pain? Yes: NPRS scale: 5/10 Pain location: L knee Pain description: aching Aggravating factors: walking Relieving factors: rest  PRECAUTIONS: None  WEIGHT BEARING RESTRICTIONS No  FALLS:  Has patient fallen in last 6 months? No  LIVING ENVIRONMENT: Lives with: lives with their spouse Lives in: House/apartment Stairs: No Has following equipment at home: Dan Humphreys - 4 wheeled and Wheelchair (manual)  OCCUPATION: retired  PLOF: Requires assistive device for independence  PATIENT GOALS  improve standing tolerance/ pull up pants/ ambulate with SPC safely.    OBJECTIVE:   PATIENT SURVEYS:  FOTO initial 46/ goal 36.   11/2: 50  COGNITION:  Overall cognitive status: Within functional limits for tasks assessed     SENSATION: WFL  POSTURE: rounded shoulders, forward head, flexed trunk , and weight shift left  PALPATION: No tenderness along R distal residual limb.  Discussed phantom limb sensation.    LOWER EXTREMITY ROM:  B LE AROM WFL except L knee extension secondary to articulating spacer.  Did not assess L/R hip extension at this time.    LOWER EXTREMITY MMT:  MMT Right eval Left eval  Hip flexion 4/5 4/5  Hip extension    Hip abduction 4+/5 4+/5  Hip adduction 4+/5 4+/5  Hip internal rotation    Hip external rotation    Knee flexion N/A 4+/5  Knee extension N/A 4+/5  Ankle dorsiflexion  5/5  Ankle plantarflexion    Ankle inversion    Ankle eversion     (Blank rows = not tested)  GAIT: Distance walked: in gym/ //-bars Assistive device utilized: Environmental consultant - 2 wheeled Level of assistance: CGA Comments: Cuing for posture correction with mirror feedback.  Flexed posture/ heavy UE assist.    TUG: UE assist required to stand/ use of RW.  51.9  seconds.     TODAY'S TREATMENT:  07/05/2022  Subjective:  Pt. Arrived to PT with husband and donned prosthetic leg on blue mat table.  Pts. Fit of prosthetic leg was excellent today and has 2 inches extra at end of strap.  Pt. C/o new R distal residual limb tenderness/ soreness over past few days (no MOI reported).  Pt. Has 3 inch diameter bruising noted at distal aspect of residual limb.     Objective:   There.ex.:  No charge   No Nustep today.   Supine mat ex.: SLR/ hip abduction/ adduction 20x.  Mat mobility/ sitting on edge of mat to inspect residual limb.     Discussed HEP/ use of shrinker/ ice to manage bruising and tenderness on residual limb.               Gait training:    Amb. From blue mat table to stairs with use of RW and focus on step through gait.  SBA/CGA on R to ensure R knee mechanism locks out before wt. Bearing.  No verbal cuing required and consistent gait pattern/ step pattern.  PT adjusted/tightened fit several times.  Pt. Reports increase R residual limb tenderness with  palpation/ standing in prosthetic leg.     Ascend/descend stairs (4 steps x 1) with step to pattern and heavy B UE assist.  Pt. Limited with continued attempts/ gait in clinic secondary to residual limb pain.    PATIENT EDUCATION:  Education details: HEP/ gait and prosthetic training.  Person educated: Patient and Spouse Education method: Explanation, Demonstration, and Verbal cues Education comprehension: verbalized understanding, returned demonstration, and tactile cues required   HOME EXERCISE PROGRAM: Prone position hip stretches/ Standing marching at kitchen counter with w/c behind patient.      ASSESSMENT:  CLINICAL IMPRESSION: Pt. Presents to PT in w/c and brought prosthetic leg.  Pt. Highly motivated to become more independent with use of RW while walking short distances in gym.  Pt. Ambulates with excellent technique/ BOS/ step pattern and no episodes of R knee mechanism buckling for  short distances today.   B UE/ shoulder fatigue with heavy UE assist with use of RW.  Pt. Tx. Limited today secondary to R distal residual limb tenderness.  Pt. Will benefit from skilled PT services to improve standing tolerance/ gait independence and ADL.    OBJECTIVE IMPAIRMENTS Abnormal gait, decreased activity tolerance, decreased balance, decreased endurance, decreased mobility, difficulty walking, decreased ROM, decreased strength, decreased safety awareness, impaired flexibility, improper body mechanics, prosthetic dependency , and pain.   ACTIVITY LIMITATIONS carrying, lifting, standing, squatting, stairs, transfers, bed mobility, bathing, toileting, dressing, hygiene/grooming, and locomotion level  PARTICIPATION LIMITATIONS: cleaning, laundry, driving, shopping, community activity, and yard work  PERSONAL FACTORS Past/current experiences are also affecting patient's functional outcome.   REHAB POTENTIAL: Good  CLINICAL DECISION MAKING: Evolving/moderate complexity  EVALUATION COMPLEXITY: High   GOALS: Goals reviewed with patient Yes  LONG TERM GOALS: Target date: 07/07/22  Pt. Will increase FOTO to 54 to improve functional mobility.  Baseline: initial FOTO 46.  11/2: 50.  4/25: 60  Goal status: Goal met  2.  Pt. Able to tolerate standing 10 minutes while pulling up pants with mod. I safely while wearing prosthetic leg to improve daily activities.  Baseline:  limited standing tolerance Goal status: Partially met  3.  Pt. Will ambulate 200 feet with use of SPC and mod. I to promote safety with household tasks/ walking into bathroom.   Baseline:  amb. With RW and min. A for safety.  2/27: walking in clinic with SBA/mod. I for 100 feet with RW and use of prosthetic leg  Goal status: Not met  4.  Pt. Will ascend 10 stairs with use of single forearm crutch/ handrail and step to pattern with mod. I safely.  Baseline:  see above Goal status: Not met  PLAN: PT FREQUENCY:  2x/week  PT DURATION: 6 weeks  PLANNED INTERVENTIONS: Therapeutic exercises, Therapeutic activity, Neuromuscular re-education, Balance training, Gait training, Patient/Family education, Self Care, Joint mobilization, Prosthetic training, DME instructions, and Manual therapy  PLAN FOR NEXT SESSION:  Progress gait/ standing tolerance.  Write MD order for tall RW/ w/c prior to [facility] home visit.   Cammie Mcgee, PT, DPT # [phone] 12:52 PM,07/05/22

## 2022-07-07 ENCOUNTER — Ambulatory Visit: Payer: Medicare Other | Admitting: Physical Therapy

## 2022-07-07 DIAGNOSIS — R293 Abnormal posture: Secondary | ICD-10-CM

## 2022-07-07 DIAGNOSIS — M6281 Muscle weakness (generalized): Secondary | ICD-10-CM

## 2022-07-07 DIAGNOSIS — R269 Unspecified abnormalities of gait and mobility: Secondary | ICD-10-CM

## 2022-07-07 DIAGNOSIS — Z89611 Acquired absence of right leg above knee: Secondary | ICD-10-CM | POA: Diagnosis not present

## 2022-07-10 NOTE — Therapy (Signed)
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   Hypertension    PONV (postoperative nausea and vomiting)    Seasonal allergies    Sleep apnea    does not uses CPAP machine anymore    Wheelchair dependent    able to self transfer   Past Surgical History:  Procedure Laterality Date   APPENDECTOMY     CATARACT EXTRACTION W/PHACO Right 02/28/2022   Procedure: CATARACT EXTRACTION PHACO AND INTRAOCULAR LENS PLACEMENT (IOC) RIGHT  4.82  00:30.9;  Surgeon: Nevada Crane, MD;  Location: [facility];  Service: Ophthalmology;  Laterality: Right;  sleep apnea   CATARACT EXTRACTION W/PHACO Left 03/14/2022   Procedure: CATARACT EXTRACTION PHACO AND INTRAOCULAR LENS PLACEMENT (IOC) LEFT;  Surgeon: Nevada Crane, MD;  Location: [facility];  Service: Ophthalmology;  Laterality: Left;  4.32 0:39.7   CESAREAN SECTION     CHOLECYSTECTOMY     DILATION AND CURETTAGE OF UTERUS     ESOPHAGOGASTRODUODENOSCOPY N/A 02/22/2019   Procedure: ESOPHAGOGASTRODUODENOSCOPY (EGD);  Surgeon: Toledo, Boykin Nearing, MD;  Location: [facility];  Service: Gastroenterology;  Laterality: N/A;   FLEXIBLE SIGMOIDOSCOPY N/A 02/22/2019   Procedure: FLEXIBLE SIGMOIDOSCOPY;  Surgeon: Toledo, Boykin Nearing, MD;  Location: [facility];  Service: Gastroenterology;  Laterality: N/A;   HARDWARE REMOVAL Right 11/12/2019   Procedure: Right ankle hardware removal;  Surgeon: Kennedy Bucker, MD;  Location: [facility];   Service: Orthopedics;  Laterality: Right;   HARDWARE REMOVAL Right 04/17/2020   Procedure: HARDWARE REMOVAL FROM RIGHT ANKLE AND RIGHT KNEE; IMPLANT OF CEMENT SPACERS IN RIGHT KNEE;  Surgeon: Donato Heinz, MD;  Location: [facility];  Service: Orthopedics;  Laterality: Right;   JOINT REPLACEMENT     LEG AMPUTATION ABOVE KNEE Right 12/02/2020   [facility]   ORIF ANKLE FRACTURE Right 03/14/2019   Procedure: OPEN REDUCTION INTERNAL FIXATION (ORIF) ANKLE FRACTURE, MEDIAL MALLEOLUS;  Surgeon: Kennedy Bucker, MD;  Location: [facility];  Service: Orthopedics;  Laterality: Right;   ORIF ANKLE FRACTURE Right 02/08/2019   Procedure: OPEN REDUCTION INTERNAL FIXATION (ORIF) ANKLE FRACTURE, post malleolus;  Surgeon: Kennedy Bucker, MD;  Location: [facility];  Service: Orthopedics;  Laterality: Right;   PARTIAL HYSTERECTOMY  1993   REPLACEMENT TOTAL KNEE Bilateral 2008   2009   SCAR DEBRIDEMENT OF TOTAL KNEE  01/2020   SYNDESMOSIS REPAIR Right 02/08/2019   Procedure: SYNDESMOSIS REPAIR;  Surgeon: Kennedy Bucker, MD;  Location: [facility];  Service: Orthopedics;  Laterality: Right;   TEE WITHOUT CARDIOVERSION N/A 02/26/2019   Procedure: TRANSESOPHAGEAL ECHOCARDIOGRAM (TEE);  Surgeon: Lamar Blinks, MD;  Location: [facility];  Service: Cardiovascular;  Laterality: N/A;   TOTAL KNEE REVISION WITH SCAR DEBRIDEMENT/PATELLA REVISION WITH POLY EXCHANGE Right 01/24/2020   Procedure: TOTAL KNEE REVISION WITH SCAR DEBRIDEMENT/PATELLA REVISION WITH POLY EXCHANGE;  Surgeon: Francesco Sor  P, MD;  Location: [facility];  Service: Orthopedics;  Laterality: Right;   TUMOR REMOVAL     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   Patient Active Problem List   Diagnosis Date Noted   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   Anxiety 04/17/2020   History of GI diverticular bleed 04/17/2020   Chest pain 04/17/2020   PAD (peripheral artery disease) (HCC) 04/05/2020   Ulcer of right leg (HCC) 04/05/2020   COPD (chronic obstructive  pulmonary disease) (HCC) 04/05/2020   GERD (gastroesophageal reflux disease) 04/05/2020   Abscess of right knee 01/22/2020   Pressure injury of skin 02/28/2019   SOB (shortness of breath)    Gastrointestinal hemorrhage    Sepsis (HCC)    Symptomatic anemia 02/20/2019   Trimalleolar fracture of ankle, closed, right, initial encounter 02/07/2019   Multiple lung nodules 03/31/2014   Extrinsic asthma 03/27/2014   OSA on CPAP 03/27/2014    PCP: Danella Penton, MD  REFERRING PROVIDER: Danella Penton, MD  REFERRING DIAG: Above knee amputation of right lower extremity  THERAPY DIAG:  S/P AKA (above knee amputation) unilateral, right (HCC)  Muscle weakness (generalized)  Gait difficulty  Abnormal posture  Rationale for Evaluation and Treatment Rehabilitation  ONSET DATE: 12/02/20  SUBJECTIVE:  EVALUATION  PERTINENT HISTORY: Patient Profile:   Jasmine Morrow is a 77 y.o. female Chief Complaint  Patient presents with  Visit Follow Up  Doing well.   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 Cardiac cath 10/2011 showed completely normal coronary arteries, however she did have mild left ventricular dysfunction with anterior wall motion abnormality. Left ventricular EF was 51%.  History of chickenpox  Hyperlipidemia  patient states she has never been diagnosed with this  Hypertension  Knee  joint replacement by other means  Macular degeneration  Major depressive disorder, recurrent, mild (CMS-HCC)  Medicare annual wellness visit, initial 08/09/2019  7/21  Multiple thyroid nodules  Normal coronary arteries 09/06/2013  Normal coronary anatomy by cardiac catheterization 10/12/11  Osteoarthritis  Ovarian cyst  PONV (postoperative nausea and vomiting)  nausea  Postoperative urinary retention 12/08/2020  Rash on lips 12/21/2020  Seasonal allergies  Sinusitis, unspecified  Sleep apnea  On CPAP.  Slow transit constipation 12/10/2020  Trimalleolar fracture of ankle, closed, right, initial encounter 02/07/2019  Complicated by staph infection 1/21, 4 weeks IV Ancef, Menz  Ulcer   Past Surgical History:  Procedure Laterality Date  SALPINGO OOPHORECTOMY Bilateral 1993  Right total knee arthroplasty 10/11/2006  Dr Ernest Pine  Left total knee arthroplasty 10/12/2007  Dr Ernest Pine  COLONOSCOPY 07/30/2012  internal hemorrhoids, diverticulosis  ANKLE ARTHODESIS W/ ARTHROSCOPY Right 02/08/2019  03/14/2019 second surgery  COLONOSCOPY 02/22/2019  Blood in entire colon/Diverticulosis - Presumed diverticular bleed. No repeat recommended per TKT.  EGD 02/22/2019  Gastritis/gastric ulcers/Hiatal hernia/Otherwise normal - no repeat recommended per TKT.  ORIF ANKLE FRACTURE Right 03/14/2019  Dr. Rosita Kea  REMOVAL HARDWARE ANKLE FOOT/TOES Right 11/12/2019  Dr. Rosita Kea  Right knee arthrotomy, irrigation and debridement of the right knee, polyethylene exchange 01/24/2020  Dr Ernest Pine  Removal of hardware (plates and screws) from the right ankle with irrigation, debridement, and placement of Stimulan antibiotic beads 04/17/2020  Dr Ernest Pine  Right knee arthrotomy, extensive irrigation debridement, removal of right total knee implants, and placement of antibiotic impregnated polymethylmethacrylate cement spacer 04/17/2020  Dr Ernest Pine  AMPUTATION LEG ABOVE KNEE AKA Right 12/02/2020  Procedure: AMPUTATION,  THIGH, THROUGH FEMUR, ANY LEVEL; Surgeon: Starr Lake, MD; Location: [facility]; Service: Orthopedics; Laterality: Right;  TRANSFER ADJACENT TISSUE LEG Right 12/02/2020  Procedure: ADJACENT TISSUE TRANSFER OR REARRANGEMENT, LEG; DEFECT 10 SQ CM OR LESS; Surgeon: Starr Lake, MD; Location: [facility]; Service: Orthopedics; Laterality: Right;  ASPIRATION/INJECTION MAJOR JOINT/BURSA KNEE Left 12/02/2020  Procedure: ARTHROCENTESIS, ASPIRATION AND/OR INJECTION, MAJOR JOINT OR BURSA, KNEE; WITHOUT ULTRASOUND GUIDANCE; Surgeon: Starr Lake, MD; Location: [facility]; Service: Orthopedics; Laterality: Left;  REMOVAL KNEE PROSTHESIS W/POSSIBLE PLACEMENT SPACER Left 01/18/2021  Procedure: REMOVAL OF PROSTHESIS, INCLUDING TOTAL KNEE PROSTHESIS, METHYLMETHACRYLATE WITH OR WITHOUT INSERTION OF SPACER, KNEE; Surgeon: Celine Mans, MD; Location: [facility]; Service: Orthopedics; Laterality: Left;  INSERTION NON-BIODEGRADABLE DRUG DELIVERY IMPLANT Left 01/18/2021  Procedure: INSERTION, KNEE, bioresorbable, biodegradable, NON-BIODEGRADABLE DRUG DELIVERY IMPLANT; Surgeon: Celine Mans, MD; Location: [facility]; Service: Orthopedics; Laterality: Left;  ABOVE KNEE LEG AMPUTATION  12/02/2020  APPENDECTOMY Est 1968  CESAREAN SECTION  CHOLECYSTECTOMY  FRACTURE SURGERY 1/21  HYSTERECTOMY partial  JOINT REPLACEMENT 2008 & 2009  TUBAL LIGATION 3/79   ALLERGIES: Allergies  Allergen Reactions  Singulair [Montelukast] Other (See Comments)  Gland swelling  Sulfa (Sulfonamide Antibiotics) Swelling  Oxycodone Dizziness and Other (See Comments)  Vancomycin Analogues Other (See Comments)  Ringing in ears  Avinza [Morphine] Itching  Darvocet A500 [Propoxyphene N-Acetaminophen] Nausea  Nickel Rash  Ultracet [Tramadol-Acetaminophen] Rash  Vicodin [Hydrocodone-Acetaminophen] Vomiting  Vioxx [Rofecoxib] Other (See Comments)  GI   CURRENT  MEDICATIONS: Current Outpatient Medications: acetaminophen (TYLENOL) 500 MG tablet, Take 1,000 mg by mouth 3 (three) times a day Twice daily per patient., PRN Not Currently Taking aspirin 81 MG EC tablet, Take 1 tablet (81 mg total) by mouth at bedtime, Taking calcium carbonate-vitamin D3 (CALTRATE 600+D) 600 mg-10 mcg (400 unit) tablet, Take 0.5 tablets by mouth 2 (two) times daily, Taking cetirizine (ZYRTEC) 10 mg capsule, Take 1 capsule (10 mg total) by mouth once daily, Taking escitalopram oxalate (LEXAPRO) 10 MG tablet, Take 1 tablet (10 mg total) by mouth once daily, Taking estradioL (ESTRACE) 0.01 % (0.1 mg/gram) vaginal cream, Place 2 g vaginally twice a week, Taking folic acid/multivit,iron,miner (MULTIVIT-IRON-MIN-FOLIC ACID ORAL), Take 1 tablet by mouth once daily, Taking gabapentin (NEURONTIN) 300 MG capsule, Take 1 capsule (300 mg total) by mouth 3 (three) times daily Phantom limb pain, Taking melatonin 3 mg tablet, Take 1 tablet (3 mg total) by mouth at bedtime, Taking multivitamin with iron (COMPLETE MULTIVITAMIN-MINERAL) tablet, Take 1 tablet by mouth, Taking multivitamin with minerals, EYE, (PRESERVISION AREDS 2) soft gel capsule, Take 1 capsule by mouth 2 (two) times daily, Taking nystatin (MYCOSTATIN) 100,000 unit/gram powder, Apply small amount topically twice daily for rash under breasts, PRN Not Currently Taking pantoprazole (PROTONIX) 40 MG DR tablet, Take 1 tablet (40 mg total) by mouth 2 (two) times daily. Please call to schedule an appt. Thanks!, Taking polyethylene glycol (MIRALAX) powder, Take 17 g by mouth once daily Mix in 4-8ounces of fluid prior to taking., Taking sennosides-docusate (SENOKOT-S) 8.6-50 mg  tablet, Take 1 tablet by mouth 2 (two) times daily For constipation, Taking lactose-reduced food (ENSURE PLUS ORAL), Take 237 mLs by mouth 2 (two) times daily With breakfast and dinner  HPI   CLINICAL SUMMARY:  Patient post right AKA and working with prosthesis.  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 Able to do housework, improving  PAIN:  Are you having pain? Yes: NPRS scale: 5/10 Pain location: L knee Pain description: aching Aggravating factors: walking Relieving factors: rest  PRECAUTIONS: None  WEIGHT BEARING RESTRICTIONS No  FALLS:  Has patient fallen in last 6 months? No  LIVING ENVIRONMENT: Lives with: lives with their spouse Lives in: House/apartment Stairs: No Has following equipment at home: Dan Humphreys - 4 wheeled and Wheelchair (manual)  OCCUPATION: retired  PLOF: Requires assistive device for independence  PATIENT GOALS  improve standing tolerance/ pull up pants/ ambulate with SPC safely.    OBJECTIVE:   PATIENT SURVEYS:  FOTO initial 46/ goal 40.   11/2: 50  COGNITION:  Overall cognitive status: Within functional limits for tasks assessed     SENSATION: WFL  POSTURE: rounded shoulders, forward head, flexed trunk , and weight shift left  PALPATION: No tenderness along R distal residual limb.  Discussed phantom limb sensation.    LOWER EXTREMITY ROM:  B LE AROM WFL except L knee extension secondary to articulating spacer.  Did not assess L/R hip extension at this time.    LOWER EXTREMITY MMT:  MMT Right eval Left eval  Hip flexion 4/5 4/5  Hip extension    Hip abduction 4+/5 4+/5  Hip adduction 4+/5 4+/5  Hip internal rotation    Hip external rotation    Knee flexion N/A 4+/5  Knee extension N/A 4+/5  Ankle dorsiflexion  5/5  Ankle plantarflexion    Ankle inversion    Ankle eversion     (Blank rows = not tested)  GAIT: Distance walked: in gym/ //-bars Assistive device utilized: Environmental consultant - 2 wheeled Level of assistance: CGA Comments: Cuing for posture correction with mirror feedback.  Flexed posture/ heavy UE assist.    TUG: UE assist required to stand/ use of RW.  51.9 seconds.     TODAY'S TREATMENT:  07/10/2022  Subjective:  Pt. Arrived to PT with husband and  donned prosthetic leg on blue mat table.  Pts. Fit of prosthetic leg was excellent today and has 2 inches extra at end of strap.  No residual limb tenderness reported today.  Pt. States she is feeling better and ready to walk.    Objective:   There.ex.:  No charge   Nustep L4 10+ min. B UE/LE (consistent cadence/ no residual limb pain or tenderness reported).                Gait training:    Amb. From blue mat table to stairs with use of RW and focus on step through gait.  SBA/CGA on R to ensure R knee mechanism locks out before wt. Bearing.  No verbal cuing required and consistent gait pattern/ step pattern.  PT adjusted/tightened fit several times.  Pt. Reports increase R residual limb tenderness with palpation/ standing in prosthetic leg.     Ascend/descend stairs (4 steps x 1) with step to pattern and heavy B UE assist.  Pt. Limited with continued attempts/ gait in clinic secondary to residual limb pain.     Ambulate with use of rollator in clinic (pt. Challenged with managing rollator use).  PT  discussed use of tall upright rollator to decrease B shoulder pain/ issues.  Issued handout.    Amb. Outside on sidewalk with use of RW and CGA for safety.  Pt. Requires seated rest break after completing ramp on side of building.  Moderate B shoulder/UE fatigue at end of tx.   PATIENT EDUCATION:  Education details: HEP/ gait and prosthetic training.  Person educated: Patient and Spouse Education method: Explanation, Demonstration, and Verbal cues Education comprehension: verbalized understanding, returned demonstration, and tactile cues required   HOME EXERCISE PROGRAM: Prone position hip stretches/ Standing marching at kitchen counter with w/c behind patient.      ASSESSMENT:  CLINICAL IMPRESSION: Pt. Presents to PT in w/c and brought prosthetic leg.  Pt. Highly motivated to become more independent with use of RW while walking short distances in gym.  Pt. Ambulates with excellent  technique/ BOS/ step pattern and no episodes of R knee mechanism buckling for short distances today.   B UE/ shoulder fatigue with heavy UE assist with use of RW.  Marked increase in gait distance/ outside walking with CGA from PT to manage door/threshold.  No LOB but moderate shoulder/ UE fatigue.   Pt. Will benefit from skilled PT services to improve standing tolerance/ gait independence and ADL.    OBJECTIVE IMPAIRMENTS Abnormal gait, decreased activity tolerance, decreased balance, decreased endurance, decreased mobility, difficulty walking, decreased ROM, decreased strength, decreased safety awareness, impaired flexibility, improper body mechanics, prosthetic dependency , and pain.   ACTIVITY LIMITATIONS carrying, lifting, standing, squatting, stairs, transfers, bed mobility, bathing, toileting, dressing, hygiene/grooming, and locomotion level  PARTICIPATION LIMITATIONS: cleaning, laundry, driving, shopping, community activity, and yard work  PERSONAL FACTORS Past/current experiences are also affecting patient's functional outcome.   REHAB POTENTIAL: Good  CLINICAL DECISION MAKING: Evolving/moderate complexity  EVALUATION COMPLEXITY: High   GOALS: Goals reviewed with patient Yes  LONG TERM GOALS: Target date: 07/07/22  Pt. Will increase FOTO to 54 to improve functional mobility.  Baseline: initial FOTO 46.  11/2: 50.  4/25: 60  Goal status: Goal met  2.  Pt. Able to tolerate standing 10 minutes while pulling up pants with mod. I safely while wearing prosthetic leg to improve daily activities.  Baseline:  limited standing tolerance Goal status: Partially met  3.  Pt. Will ambulate 200 feet with use of SPC and mod. I to promote safety with household tasks/ walking into bathroom.   Baseline:  amb. With RW and min. A for safety.  2/27: walking in clinic with SBA/mod. I for 100 feet with RW and use of prosthetic leg  Goal status: Not met  4.  Pt. Will ascend 10 stairs with use of  single forearm crutch/ handrail and step to pattern with mod. I safely.  Baseline:  see above Goal status: Not met  PLAN: PT FREQUENCY: 2x/week  PT DURATION: 6 weeks  PLANNED INTERVENTIONS: Therapeutic exercises, Therapeutic activity, Neuromuscular re-education, Balance training, Gait training, Patient/Family education, Self Care, Joint mobilization, Prosthetic training, DME instructions, and Manual therapy  PLAN FOR NEXT SESSION:  Progress gait/ standing tolerance.  Write MD order for tall RW/ w/c prior to [facility] home visit.   RECERT  Cammie Mcgee, PT, DPT # [phone] 1:40 PM,07/10/22

## 2022-07-12 ENCOUNTER — Ambulatory Visit: Payer: Medicare Other | Admitting: Physical Therapy

## 2022-07-12 ENCOUNTER — Encounter: Payer: Self-pay | Admitting: Physical Therapy

## 2022-07-12 DIAGNOSIS — M6281 Muscle weakness (generalized): Secondary | ICD-10-CM

## 2022-07-12 DIAGNOSIS — Z89611 Acquired absence of right leg above knee: Secondary | ICD-10-CM

## 2022-07-12 DIAGNOSIS — R269 Unspecified abnormalities of gait and mobility: Secondary | ICD-10-CM

## 2022-07-12 DIAGNOSIS — R293 Abnormal posture: Secondary | ICD-10-CM

## 2022-07-13 NOTE — Therapy (Signed)
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   Hypertension    PONV (postoperative nausea and vomiting)    Seasonal allergies    Sleep apnea    does not uses CPAP machine anymore    Wheelchair dependent    able to self transfer   Past Surgical History:  Procedure Laterality Date   APPENDECTOMY     CATARACT EXTRACTION W/PHACO Right 02/28/2022   Procedure: CATARACT EXTRACTION PHACO AND INTRAOCULAR LENS PLACEMENT (IOC) RIGHT  4.82  00:30.9;  Surgeon: Jasmine Crane, MD;  Location: [facility];  Service: Ophthalmology;  Laterality: Right;  sleep apnea   CATARACT EXTRACTION W/PHACO Left 03/14/2022   Procedure: CATARACT EXTRACTION PHACO AND INTRAOCULAR LENS PLACEMENT (IOC) LEFT;  Surgeon: Jasmine Crane, MD;  Location: [facility];  Service: Ophthalmology;  Laterality: Left;  4.32 0:39.7   CESAREAN SECTION     CHOLECYSTECTOMY     DILATION AND CURETTAGE OF UTERUS     ESOPHAGOGASTRODUODENOSCOPY N/A 02/22/2019   Procedure: ESOPHAGOGASTRODUODENOSCOPY (EGD);  Surgeon: Jasmine Morrow, Jasmine Nearing, MD;  Location: [facility];  Service: Gastroenterology;  Laterality: N/A;   FLEXIBLE SIGMOIDOSCOPY N/A 02/22/2019   Procedure: FLEXIBLE SIGMOIDOSCOPY;  Surgeon: Jasmine Morrow, Jasmine Nearing, MD;  Location: [facility];  Service: Gastroenterology;  Laterality: N/A;   HARDWARE REMOVAL Right 11/12/2019   Procedure: Right ankle hardware removal;  Surgeon: Jasmine Bucker, MD;   Location: [facility];  Service: Orthopedics;  Laterality: Right;   HARDWARE REMOVAL Right 04/17/2020   Procedure: HARDWARE REMOVAL FROM RIGHT ANKLE AND RIGHT KNEE; IMPLANT OF CEMENT SPACERS IN RIGHT KNEE;  Surgeon: Jasmine Heinz, MD;  Location: [facility];  Service: Orthopedics;  Laterality: Right;   JOINT REPLACEMENT     LEG AMPUTATION ABOVE KNEE Right 12/02/2020   Jasmine Morrow   ORIF ANKLE FRACTURE Right 03/14/2019   Procedure: OPEN REDUCTION INTERNAL FIXATION (ORIF) ANKLE FRACTURE, MEDIAL MALLEOLUS;  Surgeon: Jasmine Bucker, MD;  Location: [facility];  Service: Orthopedics;  Laterality: Right;   ORIF ANKLE FRACTURE Right 02/08/2019   Procedure: OPEN REDUCTION INTERNAL FIXATION (ORIF) ANKLE FRACTURE, post malleolus;  Surgeon: Jasmine Bucker, MD;  Location: [facility];  Service: Orthopedics;  Laterality: Right;   PARTIAL HYSTERECTOMY  1993   REPLACEMENT TOTAL KNEE Bilateral 2008   2009   SCAR DEBRIDEMENT OF TOTAL KNEE  01/2020   SYNDESMOSIS REPAIR Right 02/08/2019   Procedure: SYNDESMOSIS REPAIR;  Surgeon: Jasmine Bucker, MD;  Location: [facility];  Service: Orthopedics;  Laterality: Right;   TEE WITHOUT CARDIOVERSION N/A 02/26/2019   Procedure: TRANSESOPHAGEAL ECHOCARDIOGRAM (TEE);  Surgeon: Jasmine Blinks, MD;  Location: [facility];  Service: Cardiovascular;  Laterality: N/A;   TOTAL KNEE REVISION WITH SCAR DEBRIDEMENT/PATELLA REVISION WITH POLY EXCHANGE Right 01/24/2020   Procedure: TOTAL KNEE REVISION WITH SCAR DEBRIDEMENT/PATELLA REVISION WITH POLY EXCHANGE;  Surgeon: Jasmine Morrow,  Jasmine Labrador, MD;  Location: [facility];  Service: Orthopedics;  Laterality: Right;   TUMOR REMOVAL     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   Patient Active Problem List   Diagnosis Date Noted   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   Anxiety 04/17/2020   History of GI diverticular bleed 04/17/2020   Chest pain 04/17/2020   PAD (peripheral artery disease) (HCC) 04/05/2020   Ulcer of right leg (HCC) 04/05/2020   COPD  (chronic obstructive pulmonary disease) (HCC) 04/05/2020   GERD (gastroesophageal reflux disease) 04/05/2020   Abscess of right knee 01/22/2020   Pressure injury of skin 02/28/2019   SOB (shortness of breath)    Gastrointestinal hemorrhage    Sepsis (HCC)    Symptomatic anemia 02/20/2019   Trimalleolar fracture of ankle, closed, right, initial encounter 02/07/2019   Multiple lung nodules 03/31/2014   Extrinsic asthma 03/27/2014   OSA on CPAP 03/27/2014    PCP: Jasmine Penton, MD  REFERRING PROVIDER: Danella Penton, MD  REFERRING DIAG: Above knee amputation of right lower extremity  THERAPY DIAG:  S/P AKA (above knee amputation) unilateral, right (HCC)  Muscle weakness (generalized)  Gait difficulty  Abnormal posture  Rationale for Evaluation and Treatment Rehabilitation  ONSET DATE: 12/02/20  SUBJECTIVE:  EVALUATION  PERTINENT HISTORY: Patient Profile:   Jasmine Morrow is a 77 y.o. female Chief Complaint  Patient presents with  Visit Follow Up  Doing well.   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 Cardiac cath 10/2011 showed completely normal coronary arteries, however she did have mild left ventricular dysfunction with anterior wall motion abnormality. Left ventricular EF was 51%.  History of chickenpox  Hyperlipidemia  patient states she has never been diagnosed with this   Hypertension  Knee joint replacement by other means  Macular degeneration  Major depressive disorder, recurrent, mild (CMS-HCC)  Medicare annual wellness visit, initial 08/09/2019  7/21  Multiple thyroid nodules  Normal coronary arteries 09/06/2013  Normal coronary anatomy by cardiac catheterization 10/12/11  Osteoarthritis  Ovarian cyst  PONV (postoperative nausea and vomiting)  nausea  Postoperative urinary retention 12/08/2020  Rash on lips 12/21/2020  Seasonal allergies  Sinusitis, unspecified  Sleep apnea  On CPAP.  Slow transit constipation 12/10/2020  Trimalleolar fracture of ankle, closed, right, initial encounter 02/07/2019  Complicated by staph infection 1/21, 4 weeks IV Ancef, Menz  Ulcer   Past Surgical History:  Procedure Laterality Date  SALPINGO OOPHORECTOMY Bilateral 1993  Right total knee arthroplasty 10/11/2006  Dr Jasmine Morrow  Left total knee arthroplasty 10/12/2007  Dr Jasmine Morrow  COLONOSCOPY 07/30/2012  internal hemorrhoids, diverticulosis  ANKLE ARTHODESIS W/ ARTHROSCOPY Right 02/08/2019  03/14/2019 second surgery  COLONOSCOPY 02/22/2019  Blood in entire colon/Diverticulosis - Presumed diverticular bleed. No repeat recommended per TKT.  EGD 02/22/2019  Gastritis/gastric ulcers/Hiatal hernia/Otherwise normal - no repeat recommended per TKT.  ORIF ANKLE FRACTURE Right 03/14/2019  Dr. Rosita Kea  REMOVAL HARDWARE ANKLE FOOT/TOES Right 11/12/2019  Dr.  Menz  Right knee arthrotomy, irrigation and debridement of the right knee, polyethylene exchange 01/24/2020  Dr Jasmine Morrow  Removal of hardware (plates and screws) from the right ankle with irrigation, debridement, and placement of Stimulan antibiotic beads 04/17/2020  Dr Jasmine Morrow  Right knee arthrotomy, extensive irrigation debridement, removal of right total knee implants, and placement of antibiotic impregnated polymethylmethacrylate cement spacer 04/17/2020  Dr Jasmine Morrow  AMPUTATION LEG ABOVE KNEE AKA Right 12/02/2020   Procedure: AMPUTATION, THIGH, THROUGH FEMUR, ANY LEVEL; Surgeon: Starr Lake, MD; [facility]; Service: Orthopedics; Laterality: Right;  TRANSFER ADJACENT TISSUE LEG Right 12/02/2020  Procedure: ADJACENT TISSUE TRANSFER OR REARRANGEMENT, LEG; DEFECT 10 SQ CM OR LESS; Surgeon: Starr Lake, MD; [facility]; Service: Orthopedics; Laterality: Right;  ASPIRATION/INJECTION MAJOR JOINT/BURSA KNEE Left 12/02/2020  Procedure: ARTHROCENTESIS, ASPIRATION AND/OR INJECTION, MAJOR JOINT OR BURSA, KNEE; WITHOUT ULTRASOUND GUIDANCE; Surgeon: Starr Lake, MD; [facility]; Service: Orthopedics; Laterality: Left;  REMOVAL KNEE PROSTHESIS W/POSSIBLE PLACEMENT SPACER Left 01/18/2021  Procedure: REMOVAL OF PROSTHESIS, INCLUDING TOTAL KNEE PROSTHESIS, METHYLMETHACRYLATE WITH OR WITHOUT INSERTION OF SPACER, KNEE; Surgeon: Celine Mans, MD; [facility]; Service: Orthopedics; Laterality: Left;  INSERTION NON-BIODEGRADABLE DRUG DELIVERY IMPLANT Left 01/18/2021  Procedure: INSERTION, KNEE, bioresorbable, biodegradable, NON-BIODEGRADABLE DRUG DELIVERY IMPLANT; Surgeon: Celine Mans, MD; [facility]; Service: Orthopedics; Laterality: Left;  ABOVE KNEE LEG AMPUTATION  12/02/2020  APPENDECTOMY Est 1968  CESAREAN SECTION  CHOLECYSTECTOMY  FRACTURE SURGERY 1/21  HYSTERECTOMY partial  JOINT REPLACEMENT 2008 & 2009  TUBAL LIGATION 3/79   ALLERGIES: Allergies  Allergen Reactions  Singulair [Montelukast] Other (See Comments)  Gland swelling  Sulfa (Sulfonamide Antibiotics) Swelling  Oxycodone Dizziness and Other (See Comments)  Vancomycin Analogues Other (See Comments)  Ringing in ears  Avinza [Morphine] Itching  Darvocet A500 [Propoxyphene N-Acetaminophen] Nausea  Nickel Rash  Ultracet [Tramadol-Acetaminophen] Rash  Vicodin [Hydrocodone-Acetaminophen] Vomiting  Vioxx [Rofecoxib] Other (See Comments)  GI    CURRENT MEDICATIONS: Current Outpatient Medications: acetaminophen (TYLENOL) 500 MG tablet, Take 1,000 mg by mouth 3 (three) times a day Twice daily per patient., PRN Not Currently Taking aspirin 81 MG EC tablet, Take 1 tablet (81 mg total) by mouth at bedtime, Taking calcium carbonate-vitamin D3 (CALTRATE 600+D) 600 mg-10 mcg (400 unit) tablet, Take 0.5 tablets by mouth 2 (two) times daily, Taking cetirizine (ZYRTEC) 10 mg capsule, Take 1 capsule (10 mg total) by mouth once daily, Taking escitalopram oxalate (LEXAPRO) 10 MG tablet, Take 1 tablet (10 mg total) by mouth once daily, Taking estradioL (ESTRACE) 0.01 % (0.1 mg/gram) vaginal cream, Place 2 g vaginally twice a week, Taking folic acid/multivit,iron,miner (MULTIVIT-IRON-MIN-FOLIC ACID ORAL), Take 1 tablet by mouth once daily, Taking gabapentin (NEURONTIN) 300 MG capsule, Take 1 capsule (300 mg total) by mouth 3 (three) times daily Phantom limb pain, Taking melatonin 3 mg tablet, Take 1 tablet (3 mg total) by mouth at bedtime, Taking multivitamin with iron (COMPLETE MULTIVITAMIN-MINERAL) tablet, Take 1 tablet by mouth, Taking multivitamin with minerals, EYE, (PRESERVISION AREDS 2) soft gel capsule, Take 1 capsule by mouth 2 (two) times daily, Taking nystatin (MYCOSTATIN) 100,000 unit/gram powder, Apply small amount topically twice daily for rash under breasts, PRN Not Currently Taking pantoprazole (PROTONIX) 40 MG DR tablet, Take 1 tablet (40 mg total) by mouth 2 (two) times daily. Please call to schedule an appt. Thanks!, Taking polyethylene glycol (MIRALAX) powder, Take 17 g by mouth once daily Mix in 4-8ounces of fluid prior to taking., Taking sennosides-docusate (SENOKOT-S)  8.6-50 mg tablet, Take 1 tablet by mouth 2 (two) times daily For constipation, Taking lactose-reduced food (ENSURE PLUS ORAL), Take 237 mLs by mouth 2 (two) times daily With breakfast and dinner  HPI   CLINICAL SUMMARY:  Patient post right AKA and working with  prosthesis. 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 Able to do housework, improving  PAIN:  Are you having pain? Yes: NPRS scale: 5/10 Pain location: L knee Pain description: aching Aggravating factors: walking Relieving factors: rest  PRECAUTIONS: None  WEIGHT BEARING RESTRICTIONS No  FALLS:  Has patient fallen in last 6 months? No  LIVING ENVIRONMENT: Lives with: lives with their spouse Lives in: House/apartment Stairs: No Has following equipment at home: Dan Humphreys - 4 wheeled and Wheelchair (manual)  OCCUPATION: retired  PLOF: Requires assistive device for independence  PATIENT GOALS  improve standing tolerance/ pull up pants/ ambulate with SPC safely.    OBJECTIVE:   PATIENT SURVEYS:  FOTO initial 46/ goal 69.   11/2: 50  COGNITION:  Overall cognitive status: Within functional limits for tasks assessed     SENSATION: WFL  POSTURE: rounded shoulders, forward head, flexed trunk , and weight shift left  PALPATION: No tenderness along R distal residual limb.  Discussed phantom limb sensation.    LOWER EXTREMITY ROM:  B LE AROM WFL except L knee extension secondary to articulating spacer.  Did not assess L/R hip extension at this time.    LOWER EXTREMITY MMT:  MMT Right eval Left eval  Hip flexion 4/5 4/5  Hip extension    Hip abduction 4+/5 4+/5  Hip adduction 4+/5 4+/5  Hip internal rotation    Hip external rotation    Knee flexion N/A 4+/5  Knee extension N/A 4+/5  Ankle dorsiflexion  5/5  Ankle plantarflexion    Ankle inversion    Ankle eversion     (Blank rows = not tested)  GAIT: Distance walked: in gym/ //-bars Assistive device utilized: Environmental consultant - 2 wheeled Level of assistance: CGA Comments: Cuing for posture correction with mirror feedback.  Flexed posture/ heavy UE assist.    TUG: UE assist required to stand/ use of RW.  51.9 seconds.     TODAY'S TREATMENT:  07/12/2022  Subjective:  Pt. Arrived to PT with  husband and donned prosthetic leg on blue mat table.  Pts. Fit of prosthetic leg was limited today and strap had to be adjusted multiple times before gait.  No residual limb tenderness reported today.    Objective:   There.ex.:  No charge   Nustep L4 10+ min. B UE/LE (consistent cadence/ no residual limb pain or tenderness reported).                Gait training:    Amb. From blue mat table to stairs with use of RW and focus on step through gait.  SBA/CGA on R to ensure R knee mechanism locks out before wt. Bearing.  No verbal cuing required and consistent gait pattern/ step pattern.  PT adjusted/tightened fit several times.  Pt. Reports increase R residual limb tenderness with palpation/ standing in prosthetic leg.  Pts. Tenderness improved after initial walk in gym.      Ambulate with use of rollator in clinic (pt. Challenged with managing rollator use).  PT discussed use of tall upright rollator to decrease B shoulder pain/ issues.     Amb. From [place] to front door with RW and SBA (no CGA/ good R knee  mechanism control).  Moderate fatigue in B shoulders/ L LE requiring seated rest break.   PATIENT EDUCATION:  Education details: HEP/ gait and prosthetic training.  Person educated: Patient and Spouse Education method: Explanation, Demonstration, and Verbal cues Education comprehension: verbalized understanding, returned demonstration, and tactile cues required   HOME EXERCISE PROGRAM: Prone position hip stretches/ Standing marching at kitchen counter with w/c behind patient.      ASSESSMENT:  CLINICAL IMPRESSION: Pt. Presents to PT in w/c and brought prosthetic leg.  Pt. Highly motivated to become more independent with use of RW while walking short distances in gym.  Pt. Ambulates with excellent technique/ BOS/ step pattern and no episodes of R knee mechanism buckling for short distances today.   B UE/ shoulder fatigue with heavy UE assist with use of RW.  No LOB but moderate  shoulder/ UE fatigue.  See updated goals.   Pt. Will benefit from skilled PT services to improve standing tolerance/ gait independence and ADL.    OBJECTIVE IMPAIRMENTS Abnormal gait, decreased activity tolerance, decreased balance, decreased endurance, decreased mobility, difficulty walking, decreased ROM, decreased strength, decreased safety awareness, impaired flexibility, improper body mechanics, prosthetic dependency , and pain.   ACTIVITY LIMITATIONS carrying, lifting, standing, squatting, stairs, transfers, bed mobility, bathing, toileting, dressing, hygiene/grooming, and locomotion level  PARTICIPATION LIMITATIONS: cleaning, laundry, driving, shopping, community activity, and yard work  PERSONAL FACTORS Past/current experiences are also affecting patient's functional outcome.   REHAB POTENTIAL: Good  CLINICAL DECISION MAKING: Evolving/moderate complexity  EVALUATION COMPLEXITY: High   GOALS: Goals reviewed with patient Yes  LONG TERM GOALS: Target date: 09/06/22  Pt. Will increase FOTO to 54 to improve functional mobility.  Baseline: initial FOTO 46.  11/2: 50.  4/25: 60  Goal status: Goal met  2.  Pt. Able to tolerate standing 10 minutes while pulling up pants with mod. I safely while wearing prosthetic leg to improve daily activities.  Baseline:  limited standing tolerance Goal status: Partially met  3.  Pt. Will ambulate 200 feet with use of least assistive device and mod. I to promote safety with household tasks/ walking into bathroom.   Baseline:  amb. With RW and min. A for safety.  2/27: walking in clinic with SBA/mod. I for 100 feet with RW and use of prosthetic leg.  6/11: amb. With RW or rollator <100 feet with SBA for RW/ CGA for rollator.  Goal status: Not met  4.  Pt. Will ascend 10 stairs with use of single forearm crutch/ handrail and step to pattern with mod. I safely.  Baseline:  see above Goal status: Not met  PLAN: PT FREQUENCY: 2x/week  PT DURATION:  8 weeks  PLANNED INTERVENTIONS: Therapeutic exercises, Therapeutic activity, Neuromuscular re-education, Balance training, Gait training, Patient/Family education, Self Care, Joint mobilization, Prosthetic training, DME instructions, and Manual therapy  PLAN FOR NEXT SESSION:  Progress gait/ standing tolerance.  F/u on upright rollator.    Jasmine Morrow, PT, DPT # [phone] 12:49 PM,07/13/22

## 2022-07-14 ENCOUNTER — Ambulatory Visit: Payer: Medicare Other | Admitting: Physical Therapy

## 2022-07-14 ENCOUNTER — Encounter: Payer: Self-pay | Admitting: Physical Therapy

## 2022-07-14 DIAGNOSIS — M6281 Muscle weakness (generalized): Secondary | ICD-10-CM

## 2022-07-14 DIAGNOSIS — Z89611 Acquired absence of right leg above knee: Secondary | ICD-10-CM | POA: Diagnosis not present

## 2022-07-14 DIAGNOSIS — R269 Unspecified abnormalities of gait and mobility: Secondary | ICD-10-CM

## 2022-07-14 DIAGNOSIS — R293 Abnormal posture: Secondary | ICD-10-CM

## 2022-07-14 NOTE — Therapy (Signed)
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   Hypertension    PONV (postoperative nausea and vomiting)    Seasonal allergies    Sleep apnea    does not uses CPAP machine anymore    Wheelchair dependent    able to self transfer   Past Surgical History:  Procedure Laterality Date   APPENDECTOMY     CATARACT EXTRACTION W/PHACO Right 02/28/2022   Procedure: CATARACT EXTRACTION PHACO AND INTRAOCULAR LENS PLACEMENT (IOC) RIGHT  4.82  00:30.9;  Surgeon: Nevada Crane, MD;  Location: [facility];  Service: Ophthalmology;  Laterality: Right;  sleep apnea   CATARACT EXTRACTION W/PHACO Left 03/14/2022   Procedure: CATARACT EXTRACTION PHACO AND INTRAOCULAR LENS PLACEMENT (IOC) LEFT;  Surgeon: Nevada Crane, MD;  Location: [facility];  Service: Ophthalmology;  Laterality: Left;  4.32 0:39.7   CESAREAN SECTION     CHOLECYSTECTOMY     DILATION AND CURETTAGE OF UTERUS     ESOPHAGOGASTRODUODENOSCOPY N/A 02/22/2019   Procedure: ESOPHAGOGASTRODUODENOSCOPY (EGD);  Surgeon: Toledo, Boykin Nearing, MD;  Location: [facility];  Service: Gastroenterology;  Laterality: N/A;   FLEXIBLE SIGMOIDOSCOPY N/A 02/22/2019   Procedure: FLEXIBLE SIGMOIDOSCOPY;  Surgeon: Toledo, Boykin Nearing, MD;  Location: [facility];  Service: Gastroenterology;  Laterality: N/A;   HARDWARE REMOVAL Right 11/12/2019   Procedure: Right ankle hardware removal;  Surgeon: Kennedy Bucker, MD;  Location: [facility];   Service: Orthopedics;  Laterality: Right;   HARDWARE REMOVAL Right 04/17/2020   Procedure: HARDWARE REMOVAL FROM RIGHT ANKLE AND RIGHT KNEE; IMPLANT OF CEMENT SPACERS IN RIGHT KNEE;  Surgeon: Donato Heinz, MD;  Location: [facility];  Service: Orthopedics;  Laterality: Right;   JOINT REPLACEMENT     LEG AMPUTATION ABOVE KNEE Right 12/02/2020   [facility]   ORIF ANKLE FRACTURE Right 03/14/2019   Procedure: OPEN REDUCTION INTERNAL FIXATION (ORIF) ANKLE FRACTURE, MEDIAL MALLEOLUS;  Surgeon: Kennedy Bucker, MD;  Location: [facility];  Service: Orthopedics;  Laterality: Right;   ORIF ANKLE FRACTURE Right 02/08/2019   Procedure: OPEN REDUCTION INTERNAL FIXATION (ORIF) ANKLE FRACTURE, post malleolus;  Surgeon: Kennedy Bucker, MD;  Location: [facility];  Service: Orthopedics;  Laterality: Right;   PARTIAL HYSTERECTOMY  1993   REPLACEMENT TOTAL KNEE Bilateral 2008   2009   SCAR DEBRIDEMENT OF TOTAL KNEE  01/2020   SYNDESMOSIS REPAIR Right 02/08/2019   Procedure: SYNDESMOSIS REPAIR;  Surgeon: Kennedy Bucker, MD;  Location: [facility];  Service: Orthopedics;  Laterality: Right;   TEE WITHOUT CARDIOVERSION N/A 02/26/2019   Procedure: TRANSESOPHAGEAL ECHOCARDIOGRAM (TEE);  Surgeon: Lamar Blinks, MD;  Location: [facility];  Service: Cardiovascular;  Laterality: N/A;   TOTAL KNEE REVISION WITH SCAR DEBRIDEMENT/PATELLA REVISION WITH POLY EXCHANGE Right 01/24/2020   Procedure: TOTAL KNEE REVISION WITH SCAR DEBRIDEMENT/PATELLA REVISION WITH POLY EXCHANGE;  Surgeon: Francesco Sor  P, MD;  Location: [facility];  Service: Orthopedics;  Laterality: Right;   TUMOR REMOVAL     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   Patient Active Problem List   Diagnosis Date Noted   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   Anxiety 04/17/2020   History of GI diverticular bleed 04/17/2020   Chest pain 04/17/2020   PAD (peripheral artery disease) (HCC) 04/05/2020   Ulcer of right leg (HCC) 04/05/2020   COPD (chronic obstructive  pulmonary disease) (HCC) 04/05/2020   GERD (gastroesophageal reflux disease) 04/05/2020   Abscess of right knee 01/22/2020   Pressure injury of skin 02/28/2019   SOB (shortness of breath)    Gastrointestinal hemorrhage    Sepsis (HCC)    Symptomatic anemia 02/20/2019   Trimalleolar fracture of ankle, closed, right, initial encounter 02/07/2019   Multiple lung nodules 03/31/2014   Extrinsic asthma 03/27/2014   OSA on CPAP 03/27/2014    PCP: Danella Penton, MD  REFERRING PROVIDER: Danella Penton, MD  REFERRING DIAG: Above knee amputation of right lower extremity  THERAPY DIAG:  S/P AKA (above knee amputation) unilateral, right (HCC)  Muscle weakness (generalized)  Gait difficulty  Abnormal posture  Rationale for Evaluation and Treatment Rehabilitation  ONSET DATE: 12/02/20  SUBJECTIVE:  EVALUATION  PERTINENT HISTORY: Patient Profile:   Jasmine Morrow is a 77 y.o. female Chief Complaint  Patient presents with  Visit Follow Up  Doing well.   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 Cardiac cath 10/2011 showed completely normal coronary arteries, however she did have mild left ventricular dysfunction with anterior wall motion abnormality. Left ventricular EF was 51%.  History of chickenpox  Hyperlipidemia  patient states she has never been diagnosed with this  Hypertension  Knee  joint replacement by other means  Macular degeneration  Major depressive disorder, recurrent, mild (CMS-HCC)  Medicare annual wellness visit, initial 08/09/2019  7/21  Multiple thyroid nodules  Normal coronary arteries 09/06/2013  Normal coronary anatomy by cardiac catheterization 10/12/11  Osteoarthritis  Ovarian cyst  PONV (postoperative nausea and vomiting)  nausea  Postoperative urinary retention 12/08/2020  Rash on lips 12/21/2020  Seasonal allergies  Sinusitis, unspecified  Sleep apnea  On CPAP.  Slow transit constipation 12/10/2020  Trimalleolar fracture of ankle, closed, right, initial encounter 02/07/2019  Complicated by staph infection 1/21, 4 weeks IV Ancef, Menz  Ulcer   Past Surgical History:  Procedure Laterality Date  SALPINGO OOPHORECTOMY Bilateral 1993  Right total knee arthroplasty 10/11/2006  Dr Ernest Pine  Left total knee arthroplasty 10/12/2007  Dr Ernest Pine  COLONOSCOPY 07/30/2012  internal hemorrhoids, diverticulosis  ANKLE ARTHODESIS W/ ARTHROSCOPY Right 02/08/2019  03/14/2019 second surgery  COLONOSCOPY 02/22/2019  Blood in entire colon/Diverticulosis - Presumed diverticular bleed. No repeat recommended per TKT.  EGD 02/22/2019  Gastritis/gastric ulcers/Hiatal hernia/Otherwise normal - no repeat recommended per TKT.  ORIF ANKLE FRACTURE Right 03/14/2019  Dr. Rosita Kea  REMOVAL HARDWARE ANKLE FOOT/TOES Right 11/12/2019  Dr. Rosita Kea  Right knee arthrotomy, irrigation and debridement of the right knee, polyethylene exchange 01/24/2020  Dr Ernest Pine  Removal of hardware (plates and screws) from the right ankle with irrigation, debridement, and placement of Stimulan antibiotic beads 04/17/2020  Dr Ernest Pine  Right knee arthrotomy, extensive irrigation debridement, removal of right total knee implants, and placement of antibiotic impregnated polymethylmethacrylate cement spacer 04/17/2020  Dr Ernest Pine  AMPUTATION LEG ABOVE KNEE AKA Right 12/02/2020  Procedure: AMPUTATION,  THIGH, THROUGH FEMUR, ANY LEVEL; Surgeon: Starr Lake, MD; Location: [facility]; Service: Orthopedics; Laterality: Right;  TRANSFER ADJACENT TISSUE LEG Right 12/02/2020  Procedure: ADJACENT TISSUE TRANSFER OR REARRANGEMENT, LEG; DEFECT 10 SQ CM OR LESS; Surgeon: Starr Lake, MD; Location: [facility]; Service: Orthopedics; Laterality: Right;  ASPIRATION/INJECTION MAJOR JOINT/BURSA KNEE Left 12/02/2020  Procedure: ARTHROCENTESIS, ASPIRATION AND/OR INJECTION, MAJOR JOINT OR BURSA, KNEE; WITHOUT ULTRASOUND GUIDANCE; Surgeon: Starr Lake, MD; Location: [facility]; Service: Orthopedics; Laterality: Left;  REMOVAL KNEE PROSTHESIS W/POSSIBLE PLACEMENT SPACER Left 01/18/2021  Procedure: REMOVAL OF PROSTHESIS, INCLUDING TOTAL KNEE PROSTHESIS, METHYLMETHACRYLATE WITH OR WITHOUT INSERTION OF SPACER, KNEE; Surgeon: Celine Mans, MD; Location: [facility]; Service: Orthopedics; Laterality: Left;  INSERTION NON-BIODEGRADABLE DRUG DELIVERY IMPLANT Left 01/18/2021  Procedure: INSERTION, KNEE, bioresorbable, biodegradable, NON-BIODEGRADABLE DRUG DELIVERY IMPLANT; Surgeon: Celine Mans, MD; Location: [facility]; Service: Orthopedics; Laterality: Left;  ABOVE KNEE LEG AMPUTATION  12/02/2020  APPENDECTOMY Est 1968  CESAREAN SECTION  CHOLECYSTECTOMY  FRACTURE SURGERY 1/21  HYSTERECTOMY partial  JOINT REPLACEMENT 2008 & 2009  TUBAL LIGATION 3/79   ALLERGIES: Allergies  Allergen Reactions  Singulair [Montelukast] Other (See Comments)  Gland swelling  Sulfa (Sulfonamide Antibiotics) Swelling  Oxycodone Dizziness and Other (See Comments)  Vancomycin Analogues Other (See Comments)  Ringing in ears  Avinza [Morphine] Itching  Darvocet A500 [Propoxyphene N-Acetaminophen] Nausea  Nickel Rash  Ultracet [Tramadol-Acetaminophen] Rash  Vicodin [Hydrocodone-Acetaminophen] Vomiting  Vioxx [Rofecoxib] Other (See Comments)  GI   CURRENT  MEDICATIONS: Current Outpatient Medications: acetaminophen (TYLENOL) 500 MG tablet, Take 1,000 mg by mouth 3 (three) times a day Twice daily per patient., PRN Not Currently Taking aspirin 81 MG EC tablet, Take 1 tablet (81 mg total) by mouth at bedtime, Taking calcium carbonate-vitamin D3 (CALTRATE 600+D) 600 mg-10 mcg (400 unit) tablet, Take 0.5 tablets by mouth 2 (two) times daily, Taking cetirizine (ZYRTEC) 10 mg capsule, Take 1 capsule (10 mg total) by mouth once daily, Taking escitalopram oxalate (LEXAPRO) 10 MG tablet, Take 1 tablet (10 mg total) by mouth once daily, Taking estradioL (ESTRACE) 0.01 % (0.1 mg/gram) vaginal cream, Place 2 g vaginally twice a week, Taking folic acid/multivit,iron,miner (MULTIVIT-IRON-MIN-FOLIC ACID ORAL), Take 1 tablet by mouth once daily, Taking gabapentin (NEURONTIN) 300 MG capsule, Take 1 capsule (300 mg total) by mouth 3 (three) times daily Phantom limb pain, Taking melatonin 3 mg tablet, Take 1 tablet (3 mg total) by mouth at bedtime, Taking multivitamin with iron (COMPLETE MULTIVITAMIN-MINERAL) tablet, Take 1 tablet by mouth, Taking multivitamin with minerals, EYE, (PRESERVISION AREDS 2) soft gel capsule, Take 1 capsule by mouth 2 (two) times daily, Taking nystatin (MYCOSTATIN) 100,000 unit/gram powder, Apply small amount topically twice daily for rash under breasts, PRN Not Currently Taking pantoprazole (PROTONIX) 40 MG DR tablet, Take 1 tablet (40 mg total) by mouth 2 (two) times daily. Please call to schedule an appt. Thanks!, Taking polyethylene glycol (MIRALAX) powder, Take 17 g by mouth once daily Mix in 4-8ounces of fluid prior to taking., Taking sennosides-docusate (SENOKOT-S) 8.6-50 mg  tablet, Take 1 tablet by mouth 2 (two) times daily For constipation, Taking lactose-reduced food (ENSURE PLUS ORAL), Take 237 mLs by mouth 2 (two) times daily With breakfast and dinner  HPI   CLINICAL SUMMARY:  Patient post right AKA and working with prosthesis.  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 Able to do housework, improving  PAIN:  Are you having pain? Yes: NPRS scale: 5/10 Pain location: L knee Pain description: aching Aggravating factors: walking Relieving factors: rest  PRECAUTIONS: None  WEIGHT BEARING RESTRICTIONS No  FALLS:  Has patient fallen in last 6 months? No  LIVING ENVIRONMENT: Lives with: lives with their spouse Lives in: House/apartment Stairs: No Has following equipment at home: Dan Humphreys - 4 wheeled and Wheelchair (manual)  OCCUPATION: retired  PLOF: Requires assistive device for independence  PATIENT GOALS  improve standing tolerance/ pull up pants/ ambulate with SPC safely.    OBJECTIVE:   PATIENT SURVEYS:  FOTO initial 46/ goal 36.   11/2: 50  COGNITION:  Overall cognitive status: Within functional limits for tasks assessed     SENSATION: WFL  POSTURE: rounded shoulders, forward head, flexed trunk , and weight shift left  PALPATION: No tenderness along R distal residual limb.  Discussed phantom limb sensation.    LOWER EXTREMITY ROM:  B LE AROM WFL except L knee extension secondary to articulating spacer.  Did not assess L/R hip extension at this time.    LOWER EXTREMITY MMT:  MMT Right eval Left eval  Hip flexion 4/5 4/5  Hip extension    Hip abduction 4+/5 4+/5  Hip adduction 4+/5 4+/5  Hip internal rotation    Hip external rotation    Knee flexion N/A 4+/5  Knee extension N/A 4+/5  Ankle dorsiflexion  5/5  Ankle plantarflexion    Ankle inversion    Ankle eversion     (Blank rows = not tested)  GAIT: Distance walked: in gym/ //-bars Assistive device utilized: Environmental consultant - 2 wheeled Level of assistance: CGA Comments: Cuing for posture correction with mirror feedback.  Flexed posture/ heavy UE assist.    TUG: UE assist required to stand/ use of RW.  51.9 seconds.     TODAY'S TREATMENT:  07/12/2022  Subjective:  Pt. Arrived to PT with husband and  donned prosthetic leg on blue mat table.  Pts. Fit of prosthetic leg was limited today and strap had to be adjusted multiple times before gait.  No residual limb tenderness reported today.  Pt. Planning to purchase upright rollator and a new w/c.    Objective:   There.ex.:  No charge   Nustep L5 10+ min. B UE/LE (consistent cadence/ no residual limb pain or tenderness reported).                Gait training:    Amb. From blue mat table to Nustep with use of RW and focus on step through gait.  SBA/CGA on R to ensure R knee mechanism locks out before wt. Bearing.  No verbal cuing required and consistent gait pattern/ step pattern.  PT adjusted/tightened fit several times.  Pt. Reports fatigue/ discomfort in B shoulder/ UE with heavy support during RW use.    Ambulate with use of rollator from Nustep around PT gym (pt. Challenged with managing rollator use).  PT discussed use of tall upright rollator to decrease B shoulder pain/ issues.  Use of agility ladder lines to try to keep rollator straight while managing rollator.  2  laps at agility ladder with rollator.     Amb. From [place]autilus to front door with RW and SBA (no CGA/ good R knee mechanism control).  Moderate fatigue in B shoulders/ L LE requiring seated rest break.   PATIENT EDUCATION:  Education details: HEP/ gait and prosthetic training.  Person educated: Patient and Spouse Education method: Explanation, Demonstration, and Verbal cues Education comprehension: verbalized understanding, returned demonstration, and tactile cues required   HOME EXERCISE PROGRAM: Prone position hip stretches/ Standing marching at kitchen counter with w/c behind patient.      ASSESSMENT:  CLINICAL IMPRESSION: Pt. Presents to PT in w/c and brought prosthetic leg.  Pt. Highly motivated to become more independent with use of RW while walking short distances in gym.  Pt. Ambulates with excellent technique/ BOS/ step pattern and no episodes of R knee mechanism  buckling for short distances today.   B UE/ shoulder fatigue with heavy UE assist with use of RW.  No LOB but moderate shoulder/ UE fatigue.  Pt. Will benefit from skilled PT services to improve standing tolerance/ gait independence and ADL.    OBJECTIVE IMPAIRMENTS Abnormal gait, decreased activity tolerance, decreased balance, decreased endurance, decreased mobility, difficulty walking, decreased ROM, decreased strength, decreased safety awareness, impaired flexibility, improper body mechanics, prosthetic dependency , and pain.   ACTIVITY LIMITATIONS carrying, lifting, standing, squatting, stairs, transfers, bed mobility, bathing, toileting, dressing, hygiene/grooming, and locomotion level  PARTICIPATION LIMITATIONS: cleaning, laundry, driving, shopping, community activity, and yard work  PERSONAL FACTORS Past/current experiences are also affecting patient's functional outcome.   REHAB POTENTIAL: Good  CLINICAL DECISION MAKING: Evolving/moderate complexity  EVALUATION COMPLEXITY: High   GOALS: Goals reviewed with patient Yes  LONG TERM GOALS: Target date: 09/06/22  Pt. Will increase FOTO to 54 to improve functional mobility.  Baseline: initial FOTO 46.  11/2: 50.  4/25: 60  Goal status: Goal met  2.  Pt. Able to tolerate standing 10 minutes while pulling up pants with mod. I safely while wearing prosthetic leg to improve daily activities.  Baseline:  limited standing tolerance Goal status: Partially met  3.  Pt. Will ambulate 200 feet with use of least assistive device and mod. I to promote safety with household tasks/ walking into bathroom.   Baseline:  amb. With RW and min. A for safety.  2/27: walking in clinic with SBA/mod. I for 100 feet with RW and use of prosthetic leg.  6/11: amb. With RW or rollator <100 feet with SBA for RW/ CGA for rollator.  Goal status: Not met  4.  Pt. Will ascend 10 stairs with use of single forearm crutch/ handrail and step to pattern with mod. I  safely.  Baseline:  see above Goal status: Not met  PLAN: PT FREQUENCY: 2x/week  PT DURATION: 8 weeks  PLANNED INTERVENTIONS: Therapeutic exercises, Therapeutic activity, Neuromuscular re-education, Balance training, Gait training, Patient/Family education, Self Care, Joint mobilization, Prosthetic training, DME instructions, and Manual therapy  PLAN FOR NEXT SESSION:  Progress gait/ standing tolerance.  F/u on upright rollator.    Cammie Mcgee, PT, DPT # [phone] 8:23 PM,07/14/22

## 2022-07-18 ENCOUNTER — Other Ambulatory Visit: Payer: Self-pay | Admitting: Internal Medicine

## 2022-07-18 DIAGNOSIS — Z1231 Encounter for screening mammogram for malignant neoplasm of breast: Secondary | ICD-10-CM

## 2022-07-19 ENCOUNTER — Ambulatory Visit: Payer: Medicare Other | Admitting: Physical Therapy

## 2022-07-21 ENCOUNTER — Ambulatory Visit: Payer: Medicare Other

## 2022-07-21 DIAGNOSIS — Z89611 Acquired absence of right leg above knee: Secondary | ICD-10-CM | POA: Diagnosis not present

## 2022-07-21 DIAGNOSIS — M6281 Muscle weakness (generalized): Secondary | ICD-10-CM

## 2022-07-21 DIAGNOSIS — R269 Unspecified abnormalities of gait and mobility: Secondary | ICD-10-CM

## 2022-07-21 DIAGNOSIS — R293 Abnormal posture: Secondary | ICD-10-CM

## 2022-07-21 NOTE — Therapy (Signed)
OUTPATIENT PHYSICAL THERAPY LOWER EXTREMITY TREATMENT  Patient Name: Jasmine Morrow MRN: [id number] DOB:October 20, 1945, 77 y.o., female Today's Date: 07/12/22   PT End of Session - 07/21/22 1030     Visit Number 64    Number of Visits 78    Date for PT Re-Evaluation 09/06/22    PT Start Time 1026    PT Stop Time 1117    PT Time Calculation (min) 51 min    Activity Tolerance Patient tolerated treatment well    Behavior During Therapy WFL for tasks assessed/performed             Past Medical History:  Diagnosis Date   Anemia    Anxiety    Asthma    seasonal    GERD (gastroesophageal reflux disease)    H/O above knee amputation, 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   Hypertension    PONV (postoperative nausea and vomiting)    Seasonal allergies    Sleep apnea    does not uses CPAP machine anymore    Wheelchair dependent    able to self transfer   Past Surgical History:  Procedure Laterality Date   APPENDECTOMY     CATARACT EXTRACTION W/PHACO Right 02/28/2022   Procedure: CATARACT EXTRACTION PHACO AND INTRAOCULAR LENS PLACEMENT (IOC) RIGHT  4.82  00:30.9;  Surgeon: Nevada Crane, MD;  Location: [facility];  Service: Ophthalmology;  Laterality: Right;  sleep apnea   CATARACT EXTRACTION W/PHACO Left 03/14/2022   Procedure: CATARACT EXTRACTION PHACO AND INTRAOCULAR LENS PLACEMENT (IOC) LEFT;  Surgeon: Nevada Crane, MD;  Location: [facility];  Service: Ophthalmology;  Laterality: Left;  4.32 0:39.7   CESAREAN SECTION     CHOLECYSTECTOMY     DILATION AND CURETTAGE OF UTERUS     ESOPHAGOGASTRODUODENOSCOPY N/A 02/22/2019   Procedure: ESOPHAGOGASTRODUODENOSCOPY (EGD);  Surgeon: Toledo, Boykin Nearing, MD;  Location: [facility];  Service: Gastroenterology;  Laterality: N/A;   FLEXIBLE SIGMOIDOSCOPY N/A 02/22/2019   Procedure: FLEXIBLE SIGMOIDOSCOPY;  Surgeon: Toledo, Boykin Nearing, MD;  Location: [facility];  Service: Gastroenterology;  Laterality: N/A;   HARDWARE  REMOVAL Right 11/12/2019   Procedure: Right ankle hardware removal;  Surgeon: Kennedy Bucker, MD;  Location: [facility];  Service: Orthopedics;  Laterality: Right;   HARDWARE REMOVAL Right 04/17/2020   Procedure: HARDWARE REMOVAL FROM RIGHT ANKLE AND RIGHT KNEE; IMPLANT OF CEMENT SPACERS IN RIGHT KNEE;  Surgeon: Donato Heinz, MD;  Location: [facility];  Service: Orthopedics;  Laterality: Right;   JOINT REPLACEMENT     LEG AMPUTATION ABOVE KNEE Right 12/02/2020   [facility]   ORIF ANKLE FRACTURE Right 03/14/2019   Procedure: OPEN REDUCTION INTERNAL FIXATION (ORIF) ANKLE FRACTURE, MEDIAL MALLEOLUS;  Surgeon: Kennedy Bucker, MD;  Location: [facility];  Service: Orthopedics;  Laterality: Right;   ORIF ANKLE FRACTURE Right 02/08/2019   Procedure: OPEN REDUCTION INTERNAL FIXATION (ORIF) ANKLE FRACTURE, post malleolus;  Surgeon: Kennedy Bucker, MD;  Location: [facility];  Service: Orthopedics;  Laterality: Right;   PARTIAL HYSTERECTOMY  1993   REPLACEMENT TOTAL KNEE Bilateral 2008   2009   SCAR DEBRIDEMENT OF TOTAL KNEE  01/2020   SYNDESMOSIS REPAIR Right 02/08/2019   Procedure: SYNDESMOSIS REPAIR;  Surgeon: Kennedy Bucker, MD;  Location: [facility];  Service: Orthopedics;  Laterality: Right;   TEE WITHOUT CARDIOVERSION N/A 02/26/2019   Procedure: TRANSESOPHAGEAL ECHOCARDIOGRAM (TEE);  Surgeon: Lamar Blinks, MD;  Location: [facility];  Service: Cardiovascular;  Laterality: N/A;   TOTAL KNEE REVISION WITH SCAR DEBRIDEMENT/PATELLA REVISION WITH POLY  EXCHANGE Right 01/24/2020   Procedure: TOTAL KNEE REVISION WITH SCAR DEBRIDEMENT/PATELLA REVISION WITH POLY EXCHANGE;  Surgeon: Donato Heinz, MD;  Location: [facility];  Service: Orthopedics;  Laterality: Right;   TUMOR REMOVAL     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   Patient Active Problem List   Diagnosis Date Noted   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   Anxiety 04/17/2020   History of GI diverticular bleed 04/17/2020   Chest pain 04/17/2020    PAD (peripheral artery disease) (HCC) 04/05/2020   Ulcer of right leg (HCC) 04/05/2020   COPD (chronic obstructive pulmonary disease) (HCC) 04/05/2020   GERD (gastroesophageal reflux disease) 04/05/2020   Abscess of right knee 01/22/2020   Pressure injury of skin 02/28/2019   SOB (shortness of breath)    Gastrointestinal hemorrhage    Sepsis (HCC)    Symptomatic anemia 02/20/2019   Trimalleolar fracture of ankle, closed, right, initial encounter 02/07/2019   Multiple lung nodules 03/31/2014   Extrinsic asthma 03/27/2014   OSA on CPAP 03/27/2014    PCP: Danella Penton, MD  REFERRING PROVIDER: Danella Penton, MD  REFERRING DIAG: Above knee amputation of right lower extremity  THERAPY DIAG:  S/P AKA (above knee amputation) unilateral, right (HCC)  Muscle weakness (generalized)  Gait difficulty  Abnormal posture  Rationale for Evaluation and Treatment Rehabilitation  ONSET DATE: 12/02/20  SUBJECTIVE:  EVALUATION  PERTINENT HISTORY: Patient Profile:   Jasmine Morrow is a 77 y.o. female Chief Complaint  Patient presents with  Visit Follow Up  Doing well.   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 Cardiac cath 10/2011 showed completely normal coronary arteries, however she did have mild left ventricular dysfunction with anterior wall motion abnormality. Left ventricular EF was 51%.   History of chickenpox  Hyperlipidemia  patient states she has never been diagnosed with this  Hypertension  Knee joint replacement by other means  Macular degeneration  Major depressive disorder, recurrent, mild (CMS-HCC)  Medicare annual wellness visit, initial 08/09/2019  7/21  Multiple thyroid nodules  Normal coronary arteries 09/06/2013  Normal coronary anatomy by cardiac catheterization 10/12/11  Osteoarthritis  Ovarian cyst  PONV (postoperative nausea and vomiting)  nausea  Postoperative urinary retention 12/08/2020  Rash on lips 12/21/2020  Seasonal allergies  Sinusitis, unspecified  Sleep apnea  On CPAP.  Slow transit constipation 12/10/2020  Trimalleolar fracture of ankle, closed, right, initial encounter 02/07/2019  Complicated by staph infection 1/21, 4 weeks IV Ancef, Menz  Ulcer   Past Surgical History:  Procedure Laterality Date  SALPINGO OOPHORECTOMY Bilateral 1993  Right total knee arthroplasty 10/11/2006  Dr Ernest Pine  Left total knee arthroplasty 10/12/2007  Dr Ernest Pine  COLONOSCOPY 07/30/2012  internal hemorrhoids, diverticulosis  ANKLE ARTHODESIS W/ ARTHROSCOPY Right 02/08/2019  03/14/2019 second surgery  COLONOSCOPY 02/22/2019  Blood in entire colon/Diverticulosis - Presumed diverticular bleed. No repeat recommended per TKT.  EGD 02/22/2019  Gastritis/gastric ulcers/Hiatal hernia/Otherwise normal - no repeat recommended per  TKT.  ORIF ANKLE FRACTURE Right 03/14/2019  Dr. Rosita Kea  REMOVAL HARDWARE ANKLE FOOT/TOES Right 11/12/2019  Dr. Rosita Kea  Right knee arthrotomy, irrigation and debridement of the right knee, polyethylene exchange 01/24/2020  Dr Ernest Pine  Removal of hardware (plates and screws) from the right ankle with irrigation, debridement, and placement of Stimulan antibiotic beads 04/17/2020  Dr Ernest Pine  Right knee arthrotomy, extensive irrigation debridement, removal of right total knee implants, and placement of antibiotic impregnated  polymethylmethacrylate cement spacer 04/17/2020  Dr Ernest Pine  AMPUTATION LEG ABOVE KNEE AKA Right 12/02/2020  Procedure: AMPUTATION, THIGH, THROUGH FEMUR, ANY LEVEL; Surgeon: Starr Lake, MD; Location: [facility]; Service: Orthopedics; Laterality: Right;  TRANSFER ADJACENT TISSUE LEG Right 12/02/2020  Procedure: ADJACENT TISSUE TRANSFER OR REARRANGEMENT, LEG; DEFECT 10 SQ CM OR LESS; Surgeon: Starr Lake, MD; Location: [facility]; Service: Orthopedics; Laterality: Right;  ASPIRATION/INJECTION MAJOR JOINT/BURSA KNEE Left 12/02/2020  Procedure: ARTHROCENTESIS, ASPIRATION AND/OR INJECTION, MAJOR JOINT OR BURSA, KNEE; WITHOUT ULTRASOUND GUIDANCE; Surgeon: Starr Lake, MD; Location: [facility]; Service: Orthopedics; Laterality: Left;  REMOVAL KNEE PROSTHESIS W/POSSIBLE PLACEMENT SPACER Left 01/18/2021  Procedure: REMOVAL OF PROSTHESIS, INCLUDING TOTAL KNEE PROSTHESIS, METHYLMETHACRYLATE WITH OR WITHOUT INSERTION OF SPACER, KNEE; Surgeon: Celine Mans, MD; Location: [facility]; Service: Orthopedics; Laterality: Left;  INSERTION NON-BIODEGRADABLE DRUG DELIVERY IMPLANT Left 01/18/2021  Procedure: INSERTION, KNEE, bioresorbable, biodegradable, NON-BIODEGRADABLE DRUG DELIVERY IMPLANT; Surgeon: Celine Mans, MD; Location: [facility]; Service: Orthopedics; Laterality: Left;  ABOVE KNEE LEG AMPUTATION  12/02/2020  APPENDECTOMY Est 1968  CESAREAN SECTION  CHOLECYSTECTOMY  FRACTURE SURGERY 1/21  HYSTERECTOMY partial  JOINT REPLACEMENT 2008 & 2009  TUBAL LIGATION 3/79   ALLERGIES: Allergies  Allergen Reactions  Singulair [Montelukast] Other (See Comments)  Gland swelling  Sulfa (Sulfonamide Antibiotics) Swelling  Oxycodone Dizziness and Other (See Comments)  Vancomycin Analogues Other (See Comments)  Ringing in ears  Avinza [Morphine] Itching  Darvocet A500 [Propoxyphene N-Acetaminophen] Nausea  Nickel Rash  Ultracet  [Tramadol-Acetaminophen] Rash  Vicodin [Hydrocodone-Acetaminophen] Vomiting  Vioxx [Rofecoxib] Other (See Comments)  GI   CURRENT MEDICATIONS: Current Outpatient Medications: acetaminophen (TYLENOL) 500 MG tablet, Take 1,000 mg by mouth 3 (three) times a day Twice daily per patient., PRN Not Currently Taking aspirin 81 MG EC tablet, Take 1 tablet (81 mg total) by mouth at bedtime, Taking calcium carbonate-vitamin D3 (CALTRATE 600+D) 600 mg-10 mcg (400 unit) tablet, Take 0.5 tablets by mouth 2 (two) times daily, Taking cetirizine (ZYRTEC) 10 mg capsule, Take 1 capsule (10 mg total) by mouth once daily, Taking escitalopram oxalate (LEXAPRO) 10 MG tablet, Take 1 tablet (10 mg total) by mouth once daily, Taking estradioL (ESTRACE) 0.01 % (0.1 mg/gram) vaginal cream, Place 2 g vaginally twice a week, Taking folic acid/multivit,iron,miner (MULTIVIT-IRON-MIN-FOLIC ACID ORAL), Take 1 tablet by mouth once daily, Taking gabapentin (NEURONTIN) 300 MG capsule, Take 1 capsule (300 mg total) by mouth 3 (three) times daily Phantom limb pain, Taking melatonin 3 mg tablet, Take 1 tablet (3 mg total) by mouth at bedtime, Taking multivitamin with iron (COMPLETE MULTIVITAMIN-MINERAL) tablet, Take 1 tablet by mouth, Taking multivitamin with minerals, EYE, (PRESERVISION AREDS 2) soft gel capsule, Take 1 capsule by mouth 2 (two) times daily, Taking nystatin (MYCOSTATIN) 100,000 unit/gram powder, Apply small amount topically twice daily for rash under breasts, PRN Not Currently Taking pantoprazole (PROTONIX) 40 MG DR tablet, Take 1 tablet (40 mg total) by mouth 2 (two) times daily. Please call to schedule an appt. Thanks!, Taking polyethylene glycol (MIRALAX)  powder, Take 17 g by mouth once daily Mix in 4-8ounces of fluid prior to taking., Taking sennosides-docusate (SENOKOT-S) 8.6-50 mg tablet, Take 1 tablet by mouth 2 (two) times daily For constipation, Taking lactose-reduced food (ENSURE PLUS ORAL), Take 237 mLs by  mouth 2 (two) times daily With breakfast and dinner  HPI   CLINICAL SUMMARY:  Patient post right AKA and working with prosthesis. 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 Able to do housework, improving  PAIN:  Are you having pain? Yes: NPRS scale: 5/10 Pain location: L knee Pain description: aching Aggravating factors: walking Relieving factors: rest  PRECAUTIONS: None  WEIGHT BEARING RESTRICTIONS No  FALLS:  Has patient fallen in last 6 months? No  LIVING ENVIRONMENT: Lives with: lives with their spouse Lives in: House/apartment Stairs: No Has following equipment at home: Dan Humphreys - 4 wheeled and Wheelchair (manual)  OCCUPATION: retired  PLOF: Requires assistive device for independence  PATIENT GOALS  improve standing tolerance/ pull up pants/ ambulate with SPC safely.    OBJECTIVE:   PATIENT SURVEYS:  FOTO initial 46/ goal 46.   11/2: 50  COGNITION:  Overall cognitive status: Within functional limits for tasks assessed     SENSATION: WFL  POSTURE: rounded shoulders, forward head, flexed trunk , and weight shift left  PALPATION: No tenderness along R distal residual limb.  Discussed phantom limb sensation.    LOWER EXTREMITY ROM:  B LE AROM WFL except L knee extension secondary to articulating spacer.  Did not assess L/R hip extension at this time.    LOWER EXTREMITY MMT:  MMT Right eval Left eval  Hip flexion 4/5 4/5  Hip extension    Hip abduction 4+/5 4+/5  Hip adduction 4+/5 4+/5  Hip internal rotation    Hip external rotation    Knee flexion N/A 4+/5  Knee extension N/A 4+/5  Ankle dorsiflexion  5/5  Ankle plantarflexion    Ankle inversion    Ankle eversion     (Blank rows = not tested)  GAIT: Distance walked: in gym/ //-bars Assistive device utilized: Environmental consultant - 2 wheeled Level of assistance: CGA Comments: Cuing for posture correction with mirror feedback.  Flexed posture/ heavy UE assist.    TUG: UE assist  required to stand/ use of RW.  51.9 seconds.     TODAY'S TREATMENT:  07/12/2022  Subjective: Pt. Arrived to PT with husband and donned prosthetic leg on blue mat table.  Pts. Fit of prosthetic leg was limited today and strap had to be adjusted multiple times before gait.  No residual limb tenderness reported today.  Pt. Planning to purchase upright rollator and a new w/c but husband looking at different stores prior to purchasing.   Objective:   There.ex.:  No charge   Nustep L5 10+ min. B UE/LE (consistent cadence/ no residual limb pain or tenderness reported).                Gait training:    Amb. From blue mat table to Nustep with use of RW and focus on step through gait.  SBA/CGA on R to ensure R knee mechanism locks out before wt. Bearing.  No verbal cuing required and consistent gait pattern/ step pattern.  PT adjusted/tightened fit several times.  Pt. Reports fatigue/ discomfort in B shoulder/ UE with heavy support during RW use.    Ambulate with use of rollator from Nustep around PT gym (pt. Challenged with managing rollator use).  PT discussed  use of tall upright rollator to decrease B shoulder pain/ issues. Cues for upright posture with patient experiencing difficulty staying upright during mobility. With time, patient able to complete smoother step pattern with use of rollator.    Amb. From blue mat table to gym door with rollator and CGA.  Moderate fatigue in B shoulders/ L LE.    PATIENT EDUCATION:  Education details: HEP/ gait and prosthetic training.  Person educated: Patient and Spouse Education method: Explanation, Demonstration, and Verbal cues Education comprehension: verbalized understanding, returned demonstration, and tactile cues required   HOME EXERCISE PROGRAM: Prone position hip stretches/ Standing marching at kitchen counter with w/c behind patient.      ASSESSMENT:  CLINICAL IMPRESSION:  Patient presents to PT in w/c with prosthetic leg in hand. Highly  motivated to become more independent with use of RW as well as introducing use of rollator for mobility. Continues to ambulate with good BOS/step pattern with no episodes of R knee mechanism buckling this session. Requires cues for upright posture but unable to maintain during ambulation. Continued discussion about upright rollator with patient looking into options at this time. Patient will continue to benefit from skilled PT services to improve standing tolerance/ gait independence and ADL.  OBJECTIVE IMPAIRMENTS Abnormal gait, decreased activity tolerance, decreased balance, decreased endurance, decreased mobility, difficulty walking, decreased ROM, decreased strength, decreased safety awareness, impaired flexibility, improper body mechanics, prosthetic dependency , and pain.   ACTIVITY LIMITATIONS carrying, lifting, standing, squatting, stairs, transfers, bed mobility, bathing, toileting, dressing, hygiene/grooming, and locomotion level  PARTICIPATION LIMITATIONS: cleaning, laundry, driving, shopping, community activity, and yard work  PERSONAL FACTORS Past/current experiences are also affecting patient's functional outcome.   REHAB POTENTIAL: Good  CLINICAL DECISION MAKING: Evolving/moderate complexity  EVALUATION COMPLEXITY: High   GOALS: Goals reviewed with patient Yes  LONG TERM GOALS: Target date: 09/06/22  Pt. Will increase FOTO to 54 to improve functional mobility.  Baseline: initial FOTO 46.  11/2: 50.  4/25: 60  Goal status: Goal met  2.  Pt. Able to tolerate standing 10 minutes while pulling up pants with mod. I safely while wearing prosthetic leg to improve daily activities.  Baseline:  limited standing tolerance Goal status: Partially met  3.  Pt. Will ambulate 200 feet with use of least assistive device and mod. I to promote safety with household tasks/ walking into bathroom.   Baseline:  amb. With RW and min. A for safety.  2/27: walking in clinic with SBA/mod. I for  100 feet with RW and use of prosthetic leg.  6/11: amb. With RW or rollator <100 feet with SBA for RW/ CGA for rollator.  Goal status: Not met  4.  Pt. Will ascend 10 stairs with use of single forearm crutch/ handrail and step to pattern with mod. I safely.  Baseline:  see above Goal status: Not met  PLAN: PT FREQUENCY: 2x/week  PT DURATION: 8 weeks  PLANNED INTERVENTIONS: Therapeutic exercises, Therapeutic activity, Neuromuscular re-education, Balance training, Gait training, Patient/Family education, Self Care, Joint mobilization, Prosthetic training, DME instructions, and Manual therapy  PLAN FOR NEXT SESSION:  Progress gait/ standing tolerance.  F/u on upright rollator.     Maylon Peppers, PT, DPT Physical Therapist - [place]  [facility]  12:25 PM,07/21/22

## 2022-07-25 NOTE — Therapy (Signed)
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   Hypertension    PONV (postoperative nausea and vomiting)    Seasonal allergies    Sleep apnea    does not uses CPAP machine anymore    Wheelchair dependent    able to self transfer   Past Surgical History:  Procedure Laterality Date   APPENDECTOMY     CATARACT EXTRACTION W/PHACO Right 02/28/2022   Procedure: CATARACT EXTRACTION PHACO AND INTRAOCULAR LENS PLACEMENT (IOC) RIGHT  4.82  00:30.9;  Surgeon: Nevada Crane, MD;  Location: [facility];  Service: Ophthalmology;  Laterality: Right;  sleep apnea   CATARACT EXTRACTION W/PHACO Left 03/14/2022   Procedure: CATARACT EXTRACTION PHACO AND INTRAOCULAR LENS PLACEMENT (IOC) LEFT;  Surgeon: Nevada Crane, MD;  Location: [facility];  Service: Ophthalmology;  Laterality: Left;  4.32 0:39.7   CESAREAN SECTION     CHOLECYSTECTOMY     DILATION AND CURETTAGE OF UTERUS     ESOPHAGOGASTRODUODENOSCOPY N/A 02/22/2019   Procedure: ESOPHAGOGASTRODUODENOSCOPY (EGD);  Surgeon: Toledo, Boykin Nearing, MD;  Location: [facility];  Service: Gastroenterology;  Laterality: N/A;   FLEXIBLE SIGMOIDOSCOPY N/A 02/22/2019   Procedure: FLEXIBLE SIGMOIDOSCOPY;  Surgeon: Toledo, Boykin Nearing, MD;  Location: [facility];  Service: Gastroenterology;  Laterality: N/A;   HARDWARE REMOVAL Right 11/12/2019   Procedure: Right ankle hardware removal;  Surgeon: Kennedy Bucker, MD;  Location: [facility];  Service: Orthopedics;  Laterality: Right;   HARDWARE REMOVAL Right 04/17/2020   Procedure: HARDWARE REMOVAL FROM RIGHT ANKLE AND RIGHT KNEE; IMPLANT OF CEMENT SPACERS IN RIGHT KNEE;  Surgeon: Donato Heinz, MD;   Location: [facility];  Service: Orthopedics;  Laterality: Right;   JOINT REPLACEMENT     LEG AMPUTATION ABOVE KNEE Right 12/02/2020   [facility]   ORIF ANKLE FRACTURE Right 03/14/2019   Procedure: OPEN REDUCTION INTERNAL FIXATION (ORIF) ANKLE FRACTURE, MEDIAL MALLEOLUS;  Surgeon: Kennedy Bucker, MD;  Location: [facility];  Service: Orthopedics;  Laterality: Right;   ORIF ANKLE FRACTURE Right 02/08/2019   Procedure: OPEN REDUCTION INTERNAL FIXATION (ORIF) ANKLE FRACTURE, post malleolus;  Surgeon: Kennedy Bucker, MD;  Location: [facility];  Service: Orthopedics;  Laterality: Right;   PARTIAL HYSTERECTOMY  1993   REPLACEMENT TOTAL KNEE Bilateral 2008   2009   SCAR DEBRIDEMENT OF TOTAL KNEE  01/2020   SYNDESMOSIS REPAIR Right 02/08/2019   Procedure: SYNDESMOSIS REPAIR;  Surgeon: Kennedy Bucker, MD;  Location: [facility];  Service: Orthopedics;  Laterality: Right;   TEE WITHOUT CARDIOVERSION N/A 02/26/2019   Procedure: TRANSESOPHAGEAL ECHOCARDIOGRAM (TEE);  Surgeon: Lamar Blinks, MD;  Location: [facility];  Service: Cardiovascular;  Laterality: N/A;   TOTAL KNEE REVISION WITH SCAR DEBRIDEMENT/PATELLA REVISION WITH POLY EXCHANGE Right 01/24/2020   Procedure: TOTAL KNEE REVISION WITH SCAR DEBRIDEMENT/PATELLA REVISION WITH POLY EXCHANGE;  Surgeon: Donato Heinz, MD;  Location: [facility];  Service: Orthopedics;  Laterality: Right;   TUMOR REMOVAL     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   Patient Active Problem List   Diagnosis Date Noted   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   Anxiety 04/17/2020   History of GI diverticular bleed 04/17/2020   Chest pain  04/17/2020   PAD (peripheral artery disease) (HCC) 04/05/2020   Ulcer of right leg (HCC) 04/05/2020   COPD (chronic obstructive pulmonary disease) (HCC) 04/05/2020   GERD (gastroesophageal reflux disease) 04/05/2020   Abscess of right knee 01/22/2020   Pressure injury of skin 02/28/2019   SOB (shortness of breath)    Gastrointestinal  hemorrhage    Sepsis (HCC)    Symptomatic anemia 02/20/2019   Trimalleolar fracture of ankle, closed, right, initial encounter 02/07/2019   Multiple lung nodules 03/31/2014   Extrinsic asthma 03/27/2014   OSA on CPAP 03/27/2014    PCP: Danella Penton, MD  REFERRING PROVIDER: Danella Penton, MD  REFERRING DIAG: Above knee amputation of right lower extremity  THERAPY DIAG:  S/P AKA (above knee amputation) unilateral, right (HCC)  Muscle weakness (generalized)  Abnormal posture  Gait difficulty  Rationale for Evaluation and Treatment Rehabilitation  ONSET DATE: 12/02/20  SUBJECTIVE:  EVALUATION  PERTINENT HISTORY: Patient Profile:   Jasmine Morrow is a 77 y.o. female Chief Complaint  Patient presents with  Visit Follow Up  Doing well.   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 Cardiac cath 10/2011 showed completely normal coronary arteries, however she did have mild left ventricular dysfunction with anterior wall motion abnormality. Left ventricular EF was 51%.  History of chickenpox  Hyperlipidemia  patient states she has never been diagnosed with this  Hypertension  Knee joint replacement by other means  Macular degeneration  Major depressive disorder, recurrent, mild (CMS-HCC)  Medicare annual wellness visit, initial 08/09/2019  7/21  Multiple thyroid nodules  Normal  coronary arteries 09/06/2013  Normal coronary anatomy by cardiac catheterization 10/12/11  Osteoarthritis  Ovarian cyst  PONV (postoperative nausea and vomiting)  nausea  Postoperative urinary retention 12/08/2020  Rash on lips 12/21/2020  Seasonal allergies  Sinusitis, unspecified  Sleep apnea  On CPAP.  Slow transit constipation 12/10/2020  Trimalleolar fracture of ankle, closed, right, initial encounter 02/07/2019  Complicated by staph infection 1/21, 4 weeks IV Ancef, Menz  Ulcer   Past Surgical History:  Procedure Laterality Date  SALPINGO OOPHORECTOMY Bilateral 1993  Right total knee arthroplasty 10/11/2006  Dr Ernest Pine  Left total knee arthroplasty 10/12/2007  Dr Ernest Pine  COLONOSCOPY 07/30/2012  internal hemorrhoids, diverticulosis  ANKLE ARTHODESIS W/ ARTHROSCOPY Right 02/08/2019  03/14/2019 second surgery  COLONOSCOPY 02/22/2019  Blood in entire colon/Diverticulosis - Presumed diverticular bleed. No repeat recommended per TKT.  EGD 02/22/2019  Gastritis/gastric ulcers/Hiatal hernia/Otherwise normal - no repeat recommended per TKT.  ORIF ANKLE FRACTURE Right 03/14/2019  Dr. Rosita Kea  REMOVAL HARDWARE ANKLE FOOT/TOES Right 11/12/2019  Dr. Rosita Kea  Right knee arthrotomy, irrigation and debridement of the right knee, polyethylene exchange 01/24/2020  Dr Ernest Pine  Removal of hardware (plates and screws) from the right ankle with irrigation, debridement, and placement of Stimulan antibiotic beads 04/17/2020  Dr Ernest Pine  Right knee arthrotomy, extensive irrigation debridement, removal of right total knee implants, and placement of antibiotic impregnated polymethylmethacrylate cement  spacer 04/17/2020  Dr Ernest Pine  AMPUTATION LEG ABOVE KNEE AKA Right 12/02/2020  Procedure: AMPUTATION, THIGH, THROUGH FEMUR, ANY LEVEL; Surgeon: Starr Lake, MD; Location: [facility]; Service: Orthopedics; Laterality: Right;  TRANSFER ADJACENT TISSUE LEG Right 12/02/2020  Procedure: ADJACENT  TISSUE TRANSFER OR REARRANGEMENT, LEG; DEFECT 10 SQ CM OR LESS; Surgeon: Starr Lake, MD; Location: [facility]; Service: Orthopedics; Laterality: Right;  ASPIRATION/INJECTION MAJOR JOINT/BURSA KNEE Left 12/02/2020  Procedure: ARTHROCENTESIS, ASPIRATION AND/OR INJECTION, MAJOR JOINT OR BURSA, KNEE; WITHOUT ULTRASOUND GUIDANCE; Surgeon: Starr Lake, MD; Location: [facility]; Service: Orthopedics; Laterality: Left;  REMOVAL KNEE PROSTHESIS W/POSSIBLE PLACEMENT SPACER Left 01/18/2021  Procedure: REMOVAL OF PROSTHESIS, INCLUDING TOTAL KNEE PROSTHESIS, METHYLMETHACRYLATE WITH OR WITHOUT INSERTION OF SPACER, KNEE; Surgeon: Celine Mans, MD; Location: [facility]; Service: Orthopedics; Laterality: Left;  INSERTION NON-BIODEGRADABLE DRUG DELIVERY IMPLANT Left 01/18/2021  Procedure: INSERTION, KNEE, bioresorbable, biodegradable, NON-BIODEGRADABLE DRUG DELIVERY IMPLANT; Surgeon: Celine Mans, MD; Location: [facility]; Service: Orthopedics; Laterality: Left;  ABOVE KNEE LEG AMPUTATION  12/02/2020  APPENDECTOMY Est 1968  CESAREAN SECTION  CHOLECYSTECTOMY  FRACTURE SURGERY 1/21  HYSTERECTOMY partial  JOINT REPLACEMENT 2008 & 2009  TUBAL LIGATION 3/79   ALLERGIES: Allergies  Allergen Reactions  Singulair [Montelukast] Other (See Comments)  Gland swelling  Sulfa (Sulfonamide Antibiotics) Swelling  Oxycodone Dizziness and Other (See Comments)  Vancomycin Analogues Other (See Comments)  Ringing in ears  Avinza [Morphine] Itching  Darvocet A500 [Propoxyphene N-Acetaminophen] Nausea  Nickel Rash  Ultracet [Tramadol-Acetaminophen] Rash  Vicodin [Hydrocodone-Acetaminophen] Vomiting  Vioxx [Rofecoxib] Other (See Comments)  GI   CURRENT MEDICATIONS: Current Outpatient Medications: acetaminophen (TYLENOL) 500 MG tablet, Take 1,000 mg by mouth 3 (three) times a day Twice daily per patient., PRN Not Currently Taking aspirin 81 MG EC tablet, Take 1 tablet (81  mg total) by mouth at bedtime, Taking calcium carbonate-vitamin D3 (CALTRATE 600+D) 600 mg-10 mcg (400 unit) tablet, Take 0.5 tablets by mouth 2 (two) times daily, Taking cetirizine (ZYRTEC) 10 mg capsule, Take 1 capsule (10 mg total) by mouth once daily, Taking escitalopram oxalate (LEXAPRO) 10 MG tablet, Take 1 tablet (10 mg total) by mouth once daily, Taking estradioL (ESTRACE) 0.01 % (0.1 mg/gram) vaginal cream, Place 2 g vaginally twice a week, Taking folic acid/multivit,iron,miner (MULTIVIT-IRON-MIN-FOLIC ACID ORAL), Take 1 tablet by mouth once daily, Taking gabapentin (NEURONTIN) 300 MG capsule, Take 1 capsule (300 mg total) by mouth 3 (three) times daily Phantom limb pain, Taking melatonin 3 mg tablet, Take 1 tablet (3 mg total) by mouth at bedtime, Taking multivitamin with iron (COMPLETE MULTIVITAMIN-MINERAL) tablet, Take 1 tablet by mouth, Taking multivitamin with minerals, EYE, (PRESERVISION AREDS 2) soft gel capsule, Take 1 capsule by mouth 2 (two) times daily, Taking nystatin (MYCOSTATIN) 100,000 unit/gram powder, Apply small amount topically twice daily for rash under breasts, PRN Not Currently Taking pantoprazole (PROTONIX) 40 MG DR tablet, Take 1 tablet (40 mg total) by mouth 2 (two) times daily. Please call to schedule an appt. Thanks!, Taking polyethylene glycol (MIRALAX) powder, Take 17 g by mouth once daily Mix in 4-8ounces of fluid prior to taking., Taking sennosides-docusate (SENOKOT-S) 8.6-50 mg tablet, Take 1 tablet by mouth 2 (two) times daily For constipation, Taking lactose-reduced food (ENSURE PLUS ORAL), Take 237 mLs by mouth 2 (two) times daily With breakfast and dinner  HPI   CLINICAL SUMMARY:  Patient post right AKA and working with prosthesis. 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 Able to  do housework, improving  PAIN:  Are you having pain? Yes: NPRS scale: 5/10 Pain location: L knee Pain description: aching Aggravating  factors: walking Relieving factors: rest  PRECAUTIONS: None  WEIGHT BEARING RESTRICTIONS No  FALLS:  Has patient fallen in last 6 months? No  LIVING ENVIRONMENT: Lives with: lives with their spouse Lives in: House/apartment Stairs: No Has following equipment at home: Dan Humphreys - 4 wheeled and Wheelchair (manual)  OCCUPATION: retired  PLOF: Requires assistive device for independence  PATIENT GOALS  improve standing tolerance/ pull up pants/ ambulate with SPC safely.    OBJECTIVE:   PATIENT SURVEYS:  FOTO initial 46/ goal 2.   11/2: 50  COGNITION:  Overall cognitive status: Within functional limits for tasks assessed     SENSATION: WFL  POSTURE: rounded shoulders, forward head, flexed trunk , and weight shift left  PALPATION: No tenderness along R distal residual limb.  Discussed phantom limb sensation.    LOWER EXTREMITY ROM:  B LE AROM WFL except L knee extension secondary to articulating spacer.  Did not assess L/R hip extension at this time.    LOWER EXTREMITY MMT:  MMT Right eval Left eval  Hip flexion 4/5 4/5  Hip extension    Hip abduction 4+/5 4+/5  Hip adduction 4+/5 4+/5  Hip internal rotation    Hip external rotation    Knee flexion N/A 4+/5  Knee extension N/A 4+/5  Ankle dorsiflexion  5/5  Ankle plantarflexion    Ankle inversion    Ankle eversion     (Blank rows = not tested)  GAIT: Distance walked: in gym/ //-bars Assistive device utilized: Environmental consultant - 2 wheeled Level of assistance: CGA Comments: Cuing for posture correction with mirror feedback.  Flexed posture/ heavy UE assist.    TUG: UE assist required to stand/ use of RW.  51.9 seconds.     TODAY'S TREATMENT:  07/12/2022  Subjective: ***Pt. Arrived to PT with husband and donned prosthetic leg on blue mat table.  Pts. Fit of prosthetic leg was limited today and strap had to be adjusted multiple times before gait.  No residual limb tenderness reported today.  Pt. Planning to purchase  upright rollator and a new w/c but husband looking at different stores prior to purchasing.   Objective:   There.ex.:  No charge   Nustep L5 10+ min. B UE/LE (consistent cadence/ no residual limb pain or tenderness reported).                Gait training:    Amb. From blue mat table to Nustep with use of RW and focus on step through gait.  SBA/CGA on R to ensure R knee mechanism locks out before wt. Bearing.  No verbal cuing required and consistent gait pattern/ step pattern.  PT adjusted/tightened fit several times.  Pt. Reports fatigue/ discomfort in B shoulder/ UE with heavy support during RW use.    Ambulate with use of rollator from Nustep around PT gym (pt. Challenged with managing rollator use).  PT discussed use of tall upright rollator to decrease B shoulder pain/ issues. Cues for upright posture with patient experiencing difficulty staying upright during mobility. With time, patient able to complete smoother step pattern with use of rollator.    Amb. From blue mat table to gym door with rollator and CGA.  Moderate fatigue in B shoulders/ L LE.    PATIENT EDUCATION:  Education details: HEP/ gait and prosthetic training.  Person educated: Patient and Spouse Education method: Explanation,  Demonstration, and Verbal cues Education comprehension: verbalized understanding, returned demonstration, and tactile cues required   HOME EXERCISE PROGRAM: Prone position hip stretches/ Standing marching at kitchen counter with w/c behind patient.      ASSESSMENT:  CLINICAL IMPRESSION: *** Patient presents to PT in w/c with prosthetic leg in hand. Highly motivated to become more independent with use of RW as well as introducing use of rollator for mobility. Continues to ambulate with good BOS/step pattern with no episodes of R knee mechanism buckling this session. Requires cues for upright posture but unable to maintain during ambulation. Continued discussion about upright rollator with patient  looking into options at this time. Patient will continue to benefit from skilled PT services to improve standing tolerance/ gait independence and ADL.  OBJECTIVE IMPAIRMENTS Abnormal gait, decreased activity tolerance, decreased balance, decreased endurance, decreased mobility, difficulty walking, decreased ROM, decreased strength, decreased safety awareness, impaired flexibility, improper body mechanics, prosthetic dependency , and pain.   ACTIVITY LIMITATIONS carrying, lifting, standing, squatting, stairs, transfers, bed mobility, bathing, toileting, dressing, hygiene/grooming, and locomotion level  PARTICIPATION LIMITATIONS: cleaning, laundry, driving, shopping, community activity, and yard work  PERSONAL FACTORS Past/current experiences are also affecting patient's functional outcome.   REHAB POTENTIAL: Good  CLINICAL DECISION MAKING: Evolving/moderate complexity  EVALUATION COMPLEXITY: High   GOALS: Goals reviewed with patient Yes  LONG TERM GOALS: Target date: 09/06/22  Pt. Will increase FOTO to 54 to improve functional mobility.  Baseline: initial FOTO 46.  11/2: 50.  4/25: 60  Goal status: Goal met  2.  Pt. Able to tolerate standing 10 minutes while pulling up pants with mod. I safely while wearing prosthetic leg to improve daily activities.  Baseline:  limited standing tolerance Goal status: Partially met  3.  Pt. Will ambulate 200 feet with use of least assistive device and mod. I to promote safety with household tasks/ walking into bathroom.   Baseline:  amb. With RW and min. A for safety.  2/27: walking in clinic with SBA/mod. I for 100 feet with RW and use of prosthetic leg.  6/11: amb. With RW or rollator <100 feet with SBA for RW/ CGA for rollator.  Goal status: Not met  4.  Pt. Will ascend 10 stairs with use of single forearm crutch/ handrail and step to pattern with mod. I safely.  Baseline:  see above Goal status: Not met  PLAN: PT FREQUENCY: 2x/week  PT  DURATION: 8 weeks  PLANNED INTERVENTIONS: Therapeutic exercises, Therapeutic activity, Neuromuscular re-education, Balance training, Gait training, Patient/Family education, Self Care, Joint mobilization, Prosthetic training, DME instructions, and Manual therapy  PLAN FOR NEXT SESSION:  Progress gait/ standing tolerance.  F/u on upright rollator.     Maylon Peppers, PT, DPT Physical Therapist -   [facility]  1:00 PM,07/25/22

## 2022-07-26 ENCOUNTER — Encounter: Payer: Self-pay | Admitting: Physical Therapy

## 2022-07-26 ENCOUNTER — Ambulatory Visit: Payer: Medicare Other

## 2022-07-26 DIAGNOSIS — Z89611 Acquired absence of right leg above knee: Secondary | ICD-10-CM | POA: Diagnosis not present

## 2022-07-26 DIAGNOSIS — R293 Abnormal posture: Secondary | ICD-10-CM

## 2022-07-26 DIAGNOSIS — R269 Unspecified abnormalities of gait and mobility: Secondary | ICD-10-CM

## 2022-07-26 DIAGNOSIS — M6281 Muscle weakness (generalized): Secondary | ICD-10-CM

## 2022-07-28 ENCOUNTER — Ambulatory Visit: Payer: Medicare Other

## 2022-07-28 ENCOUNTER — Encounter: Payer: Self-pay | Admitting: Physical Therapy

## 2022-07-28 DIAGNOSIS — R293 Abnormal posture: Secondary | ICD-10-CM

## 2022-07-28 DIAGNOSIS — R269 Unspecified abnormalities of gait and mobility: Secondary | ICD-10-CM

## 2022-07-28 DIAGNOSIS — Z89611 Acquired absence of right leg above knee: Secondary | ICD-10-CM

## 2022-07-28 DIAGNOSIS — M6281 Muscle weakness (generalized): Secondary | ICD-10-CM

## 2022-07-28 NOTE — Therapy (Signed)
OUTPATIENT PHYSICAL THERAPY LOWER EXTREMITY TREATMENT  Patient Name: CHAKA BOYSON MRN: [id number] DOB:[birth year], 77 y.o., female Today's Date: 07/12/22   PT End of Session - 07/28/22 1029     Visit Number 66    Number of Visits 78    Date for PT Re-Evaluation 09/06/22    PT Start Time 1026    PT Stop Time 1116    PT Time Calculation (min) 50 min    Activity Tolerance Patient tolerated treatment well    Behavior During Therapy WFL for tasks assessed/performed               Past Medical History:  Diagnosis Date   Anemia    Anxiety    Asthma    seasonal    GERD (gastroesophageal reflux disease)    H/O above knee amputation, 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   Hypertension    PONV (postoperative nausea and vomiting)    Seasonal allergies    Sleep apnea    does not uses CPAP machine anymore    Wheelchair dependent    able to self transfer   Past Surgical History:  Procedure Laterality Date   APPENDECTOMY     CATARACT EXTRACTION W/PHACO Right 02/28/2022   Procedure: CATARACT EXTRACTION PHACO AND INTRAOCULAR LENS PLACEMENT (IOC) RIGHT  4.82  00:30.9;  Surgeon: Nevada Crane, MD;  Location: [facility];  Service: Ophthalmology;  Laterality: Right;  sleep apnea   CATARACT EXTRACTION W/PHACO Left 03/14/2022   Procedure: CATARACT EXTRACTION PHACO AND INTRAOCULAR LENS PLACEMENT (IOC) LEFT;  Surgeon: Nevada Crane, MD;  Location: [facility];  Service: Ophthalmology;  Laterality: Left;  4.32 0:39.7   CESAREAN SECTION     CHOLECYSTECTOMY     DILATION AND CURETTAGE OF UTERUS     ESOPHAGOGASTRODUODENOSCOPY N/A 02/22/2019   Procedure: ESOPHAGOGASTRODUODENOSCOPY (EGD);  Surgeon: Toledo, Boykin Nearing, MD;  Location: [facility];  Service: Gastroenterology;  Laterality: N/A;   FLEXIBLE SIGMOIDOSCOPY N/A 02/22/2019   Procedure: FLEXIBLE SIGMOIDOSCOPY;  Surgeon: Toledo, Boykin Nearing, MD;  Location: [facility];  Service: Gastroenterology;  Laterality: N/A;    HARDWARE REMOVAL Right 11/12/2019   Procedure: Right ankle hardware removal;  Surgeon: Kennedy Bucker, MD;  Location: [facility];  Service: Orthopedics;  Laterality: Right;   HARDWARE REMOVAL Right 04/17/2020   Procedure: HARDWARE REMOVAL FROM RIGHT ANKLE AND RIGHT KNEE; IMPLANT OF CEMENT SPACERS IN RIGHT KNEE;  Surgeon: Donato Heinz, MD;  Location: [facility];  Service: Orthopedics;  Laterality: Right;   JOINT REPLACEMENT     LEG AMPUTATION ABOVE KNEE Right 12/02/2020   [facility]   ORIF ANKLE FRACTURE Right 03/14/2019   Procedure: OPEN REDUCTION INTERNAL FIXATION (ORIF) ANKLE FRACTURE, MEDIAL MALLEOLUS;  Surgeon: Kennedy Bucker, MD;  Location: [facility];  Service: Orthopedics;  Laterality: Right;   ORIF ANKLE FRACTURE Right 02/08/2019   Procedure: OPEN REDUCTION INTERNAL FIXATION (ORIF) ANKLE FRACTURE, post malleolus;  Surgeon: Kennedy Bucker, MD;  Location: [facility];  Service: Orthopedics;  Laterality: Right;   PARTIAL HYSTERECTOMY  1993   REPLACEMENT TOTAL KNEE Bilateral 2008   2009   SCAR DEBRIDEMENT OF TOTAL KNEE  01/2020   SYNDESMOSIS REPAIR Right 02/08/2019   Procedure: SYNDESMOSIS REPAIR;  Surgeon: Kennedy Bucker, MD;  Location: [facility];  Service: Orthopedics;  Laterality: Right;   TEE WITHOUT CARDIOVERSION N/A 02/26/2019   Procedure: TRANSESOPHAGEAL ECHOCARDIOGRAM (TEE);  Surgeon: Lamar Blinks, MD;  Location: [facility];  Service: Cardiovascular;  Laterality: N/A;   TOTAL KNEE REVISION WITH SCAR DEBRIDEMENT/PATELLA REVISION  WITH POLY EXCHANGE Right 01/24/2020   Procedure: TOTAL KNEE REVISION WITH SCAR DEBRIDEMENT/PATELLA REVISION WITH POLY EXCHANGE;  Surgeon: Donato Heinz, MD;  Location: [facility];  Service: Orthopedics;  Laterality: Right;   TUMOR REMOVAL     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   Patient Active Problem List   Diagnosis Date Noted   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   Anxiety 04/17/2020   History of GI diverticular bleed 04/17/2020   Chest pain  04/17/2020   PAD (peripheral artery disease) (HCC) 04/05/2020   Ulcer of right leg (HCC) 04/05/2020   COPD (chronic obstructive pulmonary disease) (HCC) 04/05/2020   GERD (gastroesophageal reflux disease) 04/05/2020   Abscess of right knee 01/22/2020   Pressure injury of skin 02/28/2019   SOB (shortness of breath)    Gastrointestinal hemorrhage    Sepsis (HCC)    Symptomatic anemia 02/20/2019   Trimalleolar fracture of ankle, closed, right, initial encounter 02/07/2019   Multiple lung nodules 03/31/2014   Extrinsic asthma 03/27/2014   OSA on CPAP 03/27/2014    PCP: Danella Penton, MD  REFERRING PROVIDER: Danella Penton, MD  REFERRING DIAG: Above knee amputation of right lower extremity  THERAPY DIAG:  Gait difficulty  S/P AKA (above knee amputation) unilateral, right (HCC)  Muscle weakness (generalized)  Abnormal posture  Rationale for Evaluation and Treatment Rehabilitation  ONSET DATE: 12/02/20  SUBJECTIVE:  EVALUATION  PERTINENT HISTORY: Patient Profile:   Jorita Bohanon is a 77 y.o. female Chief Complaint  Patient presents with  Visit Follow Up  Doing well.   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 Cardiac cath 10/2011 showed completely normal coronary arteries, however she did have mild left ventricular dysfunction with anterior wall motion abnormality. Left ventricular  EF was 51%.  History of chickenpox  Hyperlipidemia  patient states she has never been diagnosed with this  Hypertension  Knee joint replacement by other means  Macular degeneration  Major depressive disorder, recurrent, mild (CMS-HCC)  Medicare annual wellness visit, initial 08/09/2019  7/21  Multiple thyroid nodules  Normal coronary arteries 09/06/2013  Normal coronary anatomy by cardiac catheterization 10/12/11  Osteoarthritis  Ovarian cyst  PONV (postoperative nausea and vomiting)  nausea  Postoperative urinary retention 12/08/2020  Rash on lips 12/21/2020  Seasonal allergies  Sinusitis, unspecified  Sleep apnea  On CPAP.  Slow transit constipation 12/10/2020  Trimalleolar fracture of ankle, closed, right, initial encounter 02/07/2019  Complicated by staph infection 1/21, 4 weeks IV Ancef, Menz  Ulcer   Past Surgical History:  Procedure Laterality Date  SALPINGO OOPHORECTOMY Bilateral 1993  Right total knee arthroplasty 10/11/2006  Dr Ernest Pine  Left total knee arthroplasty 10/12/2007  Dr Ernest Pine  COLONOSCOPY 07/30/2012  internal hemorrhoids, diverticulosis  ANKLE ARTHODESIS W/ ARTHROSCOPY Right 02/08/2019  03/14/2019 second surgery  COLONOSCOPY 02/22/2019  Blood in entire colon/Diverticulosis - Presumed diverticular bleed. No repeat recommended per TKT.  EGD 02/22/2019  Gastritis/gastric ulcers/Hiatal hernia/Otherwise normal - no repeat  recommended per TKT.  ORIF ANKLE FRACTURE Right 03/14/2019  Dr. Rosita Kea  REMOVAL HARDWARE ANKLE FOOT/TOES Right 11/12/2019  Dr. Rosita Kea  Right knee arthrotomy, irrigation and debridement of the right knee, polyethylene exchange 01/24/2020  Dr Ernest Pine  Removal of hardware (plates and screws) from the right ankle with irrigation, debridement, and placement of Stimulan antibiotic beads 04/17/2020  Dr Ernest Pine  Right knee arthrotomy, extensive irrigation debridement, removal of right total knee implants, and placement of antibiotic impregnated  polymethylmethacrylate cement spacer 04/17/2020  Dr Ernest Pine  AMPUTATION LEG ABOVE KNEE AKA Right 12/02/2020  Procedure: AMPUTATION, THIGH, THROUGH FEMUR, ANY LEVEL; Surgeon: Starr Lake, MD; Location: [facility]; Service: Orthopedics; Laterality: Right;  TRANSFER ADJACENT TISSUE LEG Right 12/02/2020  Procedure: ADJACENT TISSUE TRANSFER OR REARRANGEMENT, LEG; DEFECT 10 SQ CM OR LESS; Surgeon: Starr Lake, MD; Location: [facility]; Service: Orthopedics; Laterality: Right;  ASPIRATION/INJECTION MAJOR JOINT/BURSA KNEE Left 12/02/2020  Procedure: ARTHROCENTESIS, ASPIRATION AND/OR INJECTION, MAJOR JOINT OR BURSA, KNEE; WITHOUT ULTRASOUND GUIDANCE; Surgeon: Starr Lake, MD; Location: [facility]; Service: Orthopedics; Laterality: Left;  REMOVAL KNEE PROSTHESIS W/POSSIBLE PLACEMENT SPACER Left 01/18/2021  Procedure: REMOVAL OF PROSTHESIS, INCLUDING TOTAL KNEE PROSTHESIS, METHYLMETHACRYLATE WITH OR WITHOUT INSERTION OF SPACER, KNEE; Surgeon: Celine Mans, MD; Location: [facility]; Service: Orthopedics; Laterality: Left;  INSERTION NON-BIODEGRADABLE DRUG DELIVERY IMPLANT Left 01/18/2021  Procedure: INSERTION, KNEE, bioresorbable, biodegradable, NON-BIODEGRADABLE DRUG DELIVERY IMPLANT; Surgeon: Celine Mans, MD; Location: [facility]; Service: Orthopedics; Laterality: Left;  ABOVE KNEE LEG AMPUTATION  12/02/2020  APPENDECTOMY Est 1968  CESAREAN SECTION  CHOLECYSTECTOMY  FRACTURE SURGERY 1/21  HYSTERECTOMY partial  JOINT REPLACEMENT 2008 & 2009  TUBAL LIGATION 3/79   ALLERGIES: Allergies  Allergen Reactions  Singulair [Montelukast] Other (See Comments)  Gland swelling  Sulfa (Sulfonamide Antibiotics) Swelling  Oxycodone Dizziness and Other (See Comments)  Vancomycin Analogues Other (See Comments)  Ringing in ears  Avinza [Morphine] Itching  Darvocet A500 [Propoxyphene N-Acetaminophen] Nausea  Nickel Rash  Ultracet  [Tramadol-Acetaminophen] Rash  Vicodin [Hydrocodone-Acetaminophen] Vomiting  Vioxx [Rofecoxib] Other (See Comments)  GI   CURRENT MEDICATIONS: Current Outpatient Medications: acetaminophen (TYLENOL) 500 MG tablet, Take 1,000 mg by mouth 3 (three) times a day Twice daily per patient., PRN Not Currently Taking aspirin 81 MG EC tablet, Take 1 tablet (81 mg total) by mouth at bedtime, Taking calcium carbonate-vitamin D3 (CALTRATE 600+D) 600 mg-10 mcg (400 unit) tablet, Take 0.5 tablets by mouth 2 (two) times daily, Taking cetirizine (ZYRTEC) 10 mg capsule, Take 1 capsule (10 mg total) by mouth once daily, Taking escitalopram oxalate (LEXAPRO) 10 MG tablet, Take 1 tablet (10 mg total) by mouth once daily, Taking estradioL (ESTRACE) 0.01 % (0.1 mg/gram) vaginal cream, Place 2 g vaginally twice a week, Taking folic acid/multivit,iron,miner (MULTIVIT-IRON-MIN-FOLIC ACID ORAL), Take 1 tablet by mouth once daily, Taking gabapentin (NEURONTIN) 300 MG capsule, Take 1 capsule (300 mg total) by mouth 3 (three) times daily Phantom limb pain, Taking melatonin 3 mg tablet, Take 1 tablet (3 mg total) by mouth at bedtime, Taking multivitamin with iron (COMPLETE MULTIVITAMIN-MINERAL) tablet, Take 1 tablet by mouth, Taking multivitamin with minerals, EYE, (PRESERVISION AREDS 2) soft gel capsule, Take 1 capsule by mouth 2 (two) times daily, Taking nystatin (MYCOSTATIN) 100,000 unit/gram powder, Apply small amount topically twice daily for rash under breasts, PRN Not Currently Taking pantoprazole (PROTONIX) 40 MG DR tablet, Take 1 tablet (40 mg total) by mouth 2 (two) times daily. Please call to schedule an appt. Thanks!, Taking polyethylene  glycol (MIRALAX) powder, Take 17 g by mouth once daily Mix in 4-8ounces of fluid prior to taking., Taking sennosides-docusate (SENOKOT-S) 8.6-50 mg tablet, Take 1 tablet by mouth 2 (two) times daily For constipation, Taking lactose-reduced food (ENSURE PLUS ORAL), Take 237 mLs by  mouth 2 (two) times daily With breakfast and dinner  HPI   CLINICAL SUMMARY:  Patient post right AKA and working with prosthesis. 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 Able to do housework, improving  PAIN:  Are you having pain? Yes: NPRS scale: 5/10 Pain location: L knee Pain description: aching Aggravating factors: walking Relieving factors: rest  PRECAUTIONS: None  WEIGHT BEARING RESTRICTIONS No  FALLS:  Has patient fallen in last 6 months? No  LIVING ENVIRONMENT: Lives with: lives with their spouse Lives in: House/apartment Stairs: No Has following equipment at home: Dan Humphreys - 4 wheeled and Wheelchair (manual)  OCCUPATION: retired  PLOF: Requires assistive device for independence  PATIENT GOALS  improve standing tolerance/ pull up pants/ ambulate with SPC safely.    OBJECTIVE:   PATIENT SURVEYS:  FOTO initial 46/ goal 24.   11/2: 50  COGNITION:  Overall cognitive status: Within functional limits for tasks assessed     SENSATION: WFL  POSTURE: rounded shoulders, forward head, flexed trunk , and weight shift left  PALPATION: No tenderness along R distal residual limb.  Discussed phantom limb sensation.    LOWER EXTREMITY ROM:  B LE AROM WFL except L knee extension secondary to articulating spacer.  Did not assess L/R hip extension at this time.    LOWER EXTREMITY MMT:  MMT Right eval Left eval  Hip flexion 4/5 4/5  Hip extension    Hip abduction 4+/5 4+/5  Hip adduction 4+/5 4+/5  Hip internal rotation    Hip external rotation    Knee flexion N/A 4+/5  Knee extension N/A 4+/5  Ankle dorsiflexion  5/5  Ankle plantarflexion    Ankle inversion    Ankle eversion     (Blank rows = not tested)  GAIT: Distance walked: in gym/ //-bars Assistive device utilized: Environmental consultant - 2 wheeled Level of assistance: CGA Comments: Cuing for posture correction with mirror feedback.  Flexed posture/ heavy UE assist.    TUG: UE assist  required to stand/ use of RW.  51.9 seconds.     TODAY'S TREATMENT:  07/12/2022  Subjective: Pt. Arrived to PT with husband and donned prosthetic leg on blue mat table.  Pts. Fit of prosthetic leg was limited today and strap had to be adjusted multiple times before gait. Residual limb with notable swelling with patient reporting eating high amount of sodium on previous date which could have led to the swelling. Difficult to get proper fit of prosthetic leg throughout session   Objective:   There.ex.:  No charge   Nustep L5 10+ min. B UE/LE (consistent cadence/ no residual limb pain or tenderness reported).                Gait training:    Amb. From blue mat table to Nustep with use of RW and focus on step through gait.  SBA/CGA on R to ensure R knee mechanism locks out before wt. Bearing.  No verbal cuing required and consistent gait pattern/ step pattern.  PT adjusted/tightened fit several times.    In // bars, ambulated fwd with use of rollator and bwd with use of // bars for stability. Use of mirror for visual feedback. Cues for  upright posture. CGA for safety to ensure R knee mechanism locks out before weight bearing.    Ambulate with use of RW from stairs to front door with focus on step through gait. Intermittent cueing for upright posture. SBA/CGA on R to ensure  R knee mechanism locks out before weightbearing. Moderate fatigue in B shoulders.   Ambulate with use of rollator from Nustep around PT gym (pt. Challenged with managing rollator use).  PT discussed use of tall upright rollator to decrease B shoulder pain/ issues. Cues for upright posture with patient experiencing difficulty staying upright during mobility. With time, patient able to complete smoother step pattern with use of rollator. (Not today)   Amb. From blue mat table to gym door with rollator and CGA.  Moderate fatigue in B shoulders/ L LE. (Not today)   PATIENT EDUCATION:  Education details: HEP/ gait and prosthetic  training.  Person educated: Patient and Spouse Education method: Explanation, Demonstration, and Verbal cues Education comprehension: verbalized understanding, returned demonstration, and tactile cues required   HOME EXERCISE PROGRAM: Prone position hip stretches/ Standing marching at kitchen counter with w/c behind patient.      ASSESSMENT:  CLINICAL IMPRESSION:  Patient presents to PT in w/c with prosthetic leg in hand. Highly motivated to become more independent with use of RW as well as introducing use of rollator for mobility. Continues to ambulate with good BOS/step pattern with no episodes of R knee mechanism buckling this session. Session focused on gait training in // bars with rollator for improving confidence and balance with use. Continued discussion about upright rollator with patient planning to order one in the near future. Patient will continue to benefit from skilled PT services to improve standing tolerance/ gait independence and ADL.  OBJECTIVE IMPAIRMENTS Abnormal gait, decreased activity tolerance, decreased balance, decreased endurance, decreased mobility, difficulty walking, decreased ROM, decreased strength, decreased safety awareness, impaired flexibility, improper body mechanics, prosthetic dependency , and pain.   ACTIVITY LIMITATIONS carrying, lifting, standing, squatting, stairs, transfers, bed mobility, bathing, toileting, dressing, hygiene/grooming, and locomotion level  PARTICIPATION LIMITATIONS: cleaning, laundry, driving, shopping, community activity, and yard work  PERSONAL FACTORS Past/current experiences are also affecting patient's functional outcome.   REHAB POTENTIAL: Good  CLINICAL DECISION MAKING: Evolving/moderate complexity  EVALUATION COMPLEXITY: High   GOALS: Goals reviewed with patient Yes  LONG TERM GOALS: Target date: 09/06/22  Pt. Will increase FOTO to 54 to improve functional mobility.  Baseline: initial FOTO 46.  11/2: 50.  4/25: 60   Goal status: Goal met  2.  Pt. Able to tolerate standing 10 minutes while pulling up pants with mod. I safely while wearing prosthetic leg to improve daily activities.  Baseline:  limited standing tolerance Goal status: Partially met  3.  Pt. Will ambulate 200 feet with use of least assistive device and mod. I to promote safety with household tasks/ walking into bathroom.   Baseline:  amb. With RW and min. A for safety.  2/27: walking in clinic with SBA/mod. I for 100 feet with RW and use of prosthetic leg.  6/11: amb. With RW or rollator <100 feet with SBA for RW/ CGA for rollator.  Goal status: Not met  4.  Pt. Will ascend 10 stairs with use of single forearm crutch/ handrail and step to pattern with mod. I safely.  Baseline:  see above Goal status: Not met  PLAN: PT FREQUENCY: 2x/week  PT DURATION: 8 weeks  PLANNED INTERVENTIONS: Therapeutic exercises, Therapeutic activity, Neuromuscular re-education, Balance training, Gait  training, Patient/Family education, Self Care, Joint mobilization, Prosthetic training, DME instructions, and Manual therapy  PLAN FOR NEXT SESSION:  Progress gait/ standing tolerance.  F/u on upright rollator.     Maylon Peppers, PT, DPT Physical Therapist - [place]  [facility]  12:08 PM,07/28/22

## 2022-08-02 ENCOUNTER — Ambulatory Visit: Payer: Medicare Other | Attending: Internal Medicine

## 2022-08-02 DIAGNOSIS — R293 Abnormal posture: Secondary | ICD-10-CM | POA: Diagnosis present

## 2022-08-02 DIAGNOSIS — R269 Unspecified abnormalities of gait and mobility: Secondary | ICD-10-CM | POA: Diagnosis present

## 2022-08-02 DIAGNOSIS — Z89611 Acquired absence of right leg above knee: Secondary | ICD-10-CM | POA: Diagnosis present

## 2022-08-02 DIAGNOSIS — M6281 Muscle weakness (generalized): Secondary | ICD-10-CM

## 2022-08-02 NOTE — Therapy (Signed)
OUTPATIENT PHYSICAL THERAPY LOWER EXTREMITY TREATMENT  Patient Name: Jasmine Morrow MRN: [id number] DOB:[birth year], 77 y.o., female Today's Date: 07/12/22   PT End of Session - 08/02/22 1019     Visit Number 67    Number of Visits 78    Date for PT Re-Evaluation 09/06/22    PT Start Time 1018    PT Stop Time 1113    PT Time Calculation (min) 55 min    Activity Tolerance Patient tolerated treatment well    Behavior During Therapy WFL for tasks assessed/performed                Past Medical History:  Diagnosis Date   Anemia    Anxiety    Asthma    seasonal    GERD (gastroesophageal reflux disease)    H/O above knee amputation, 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   Hypertension    PONV (postoperative nausea and vomiting)    Seasonal allergies    Sleep apnea    does not uses CPAP machine anymore    Wheelchair dependent    able to self transfer   Past Surgical History:  Procedure Laterality Date   APPENDECTOMY     CATARACT EXTRACTION W/PHACO Right 02/28/2022   Procedure: CATARACT EXTRACTION PHACO AND INTRAOCULAR LENS PLACEMENT (IOC) RIGHT  4.82  00:30.9;  Surgeon: Nevada Crane, MD;  Location: [facility];  Service: Ophthalmology;  Laterality: Right;  sleep apnea   CATARACT EXTRACTION W/PHACO Left 03/14/2022   Procedure: CATARACT EXTRACTION PHACO AND INTRAOCULAR LENS PLACEMENT (IOC) LEFT;  Surgeon: Nevada Crane, MD;  Location: [facility];  Service: Ophthalmology;  Laterality: Left;  4.32 0:39.7   CESAREAN SECTION     CHOLECYSTECTOMY     DILATION AND CURETTAGE OF UTERUS     ESOPHAGOGASTRODUODENOSCOPY N/A 02/22/2019   Procedure: ESOPHAGOGASTRODUODENOSCOPY (EGD);  Surgeon: Toledo, Boykin Nearing, MD;  Location: [facility];  Service: Gastroenterology;  Laterality: N/A;   FLEXIBLE SIGMOIDOSCOPY N/A 02/22/2019   Procedure: FLEXIBLE SIGMOIDOSCOPY;  Surgeon: Toledo, Boykin Nearing, MD;  Location: [facility];  Service: Gastroenterology;  Laterality: N/A;    HARDWARE REMOVAL Right 11/12/2019   Procedure: Right ankle hardware removal;  Surgeon: Kennedy Bucker, MD;  Location: [facility];  Service: Orthopedics;  Laterality: Right;   HARDWARE REMOVAL Right 04/17/2020   Procedure: HARDWARE REMOVAL FROM RIGHT ANKLE AND RIGHT KNEE; IMPLANT OF CEMENT SPACERS IN RIGHT KNEE;  Surgeon: Donato Heinz, MD;  Location: [facility];  Service: Orthopedics;  Laterality: Right;   JOINT REPLACEMENT     LEG AMPUTATION ABOVE KNEE Right 12/02/2020   [facility]   ORIF ANKLE FRACTURE Right 03/14/2019   Procedure: OPEN REDUCTION INTERNAL FIXATION (ORIF) ANKLE FRACTURE, MEDIAL MALLEOLUS;  Surgeon: Kennedy Bucker, MD;  Location: [facility];  Service: Orthopedics;  Laterality: Right;   ORIF ANKLE FRACTURE Right 02/08/2019   Procedure: OPEN REDUCTION INTERNAL FIXATION (ORIF) ANKLE FRACTURE, post malleolus;  Surgeon: Kennedy Bucker, MD;  Location: [facility];  Service: Orthopedics;  Laterality: Right;   PARTIAL HYSTERECTOMY  1993   REPLACEMENT TOTAL KNEE Bilateral 2008   2009   SCAR DEBRIDEMENT OF TOTAL KNEE  01/2020   SYNDESMOSIS REPAIR Right 02/08/2019   Procedure: SYNDESMOSIS REPAIR;  Surgeon: Kennedy Bucker, MD;  Location: [facility];  Service: Orthopedics;  Laterality: Right;   TEE WITHOUT CARDIOVERSION N/A 02/26/2019   Procedure: TRANSESOPHAGEAL ECHOCARDIOGRAM (TEE);  Surgeon: Lamar Blinks, MD;  Location: [facility];  Service: Cardiovascular;  Laterality: N/A;   TOTAL KNEE REVISION WITH SCAR DEBRIDEMENT/PATELLA  REVISION WITH POLY EXCHANGE Right 01/24/2020   Procedure: TOTAL KNEE REVISION WITH SCAR DEBRIDEMENT/PATELLA REVISION WITH POLY EXCHANGE;  Surgeon: Donato Heinz, MD;  Location: [facility];  Service: Orthopedics;  Laterality: Right;   TUMOR REMOVAL     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   Patient Active Problem List   Diagnosis Date Noted   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   Anxiety 04/17/2020   History of GI diverticular bleed 04/17/2020   Chest pain  04/17/2020   PAD (peripheral artery disease) (HCC) 04/05/2020   Ulcer of right leg (HCC) 04/05/2020   COPD (chronic obstructive pulmonary disease) (HCC) 04/05/2020   GERD (gastroesophageal reflux disease) 04/05/2020   Abscess of right knee 01/22/2020   Pressure injury of skin 02/28/2019   SOB (shortness of breath)    Gastrointestinal hemorrhage    Sepsis (HCC)    Symptomatic anemia 02/20/2019   Trimalleolar fracture of ankle, closed, right, initial encounter 02/07/2019   Multiple lung nodules 03/31/2014   Extrinsic asthma 03/27/2014   OSA on CPAP 03/27/2014    PCP: Danella Penton, MD  REFERRING PROVIDER: Danella Penton, MD  REFERRING DIAG: Above knee amputation of right lower extremity  THERAPY DIAG:  Gait difficulty  Abnormal posture  S/P AKA (above knee amputation) unilateral, right (HCC)  Muscle weakness (generalized)  Rationale for Evaluation and Treatment Rehabilitation  ONSET DATE: 12/02/20  SUBJECTIVE:  EVALUATION  PERTINENT HISTORY: Patient Profile:   Jasmine Morrow is a 77 y.o. female Chief Complaint  Patient presents with  Visit Follow Up  Doing well.   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 Cardiac cath 10/2011 showed completely normal coronary arteries, however she did have mild left ventricular dysfunction with anterior wall motion abnormality. Left ventricular  EF was 51%.  History of chickenpox  Hyperlipidemia  patient states she has never been diagnosed with this  Hypertension  Knee joint replacement by other means  Macular degeneration  Major depressive disorder, recurrent, mild (CMS-HCC)  Medicare annual wellness visit, initial 08/09/2019  7/21  Multiple thyroid nodules  Normal coronary arteries 09/06/2013  Normal coronary anatomy by cardiac catheterization 10/12/11  Osteoarthritis  Ovarian cyst  PONV (postoperative nausea and vomiting)  nausea  Postoperative urinary retention 12/08/2020  Rash on lips 12/21/2020  Seasonal allergies  Sinusitis, unspecified  Sleep apnea  On CPAP.  Slow transit constipation 12/10/2020  Trimalleolar fracture of ankle, closed, right, initial encounter 02/07/2019  Complicated by staph infection 1/21, 4 weeks IV Ancef, Menz  Ulcer   Past Surgical History:  Procedure Laterality Date  SALPINGO OOPHORECTOMY Bilateral 1993  Right total knee arthroplasty 10/11/2006  Dr Ernest Pine  Left total knee arthroplasty 10/12/2007  Dr Ernest Pine  COLONOSCOPY 07/30/2012  internal hemorrhoids, diverticulosis  ANKLE ARTHODESIS W/ ARTHROSCOPY Right 02/08/2019  03/14/2019 second surgery  COLONOSCOPY 02/22/2019  Blood in entire colon/Diverticulosis - Presumed diverticular bleed. No repeat recommended per TKT.  EGD 02/22/2019  Gastritis/gastric ulcers/Hiatal hernia/Otherwise normal - no  repeat recommended per TKT.  ORIF ANKLE FRACTURE Right 03/14/2019  Dr. Rosita Kea  REMOVAL HARDWARE ANKLE FOOT/TOES Right 11/12/2019  Dr. Rosita Kea  Right knee arthrotomy, irrigation and debridement of the right knee, polyethylene exchange 01/24/2020  Dr Ernest Pine  Removal of hardware (plates and screws) from the right ankle with irrigation, debridement, and placement of Stimulan antibiotic beads 04/17/2020  Dr Ernest Pine  Right knee arthrotomy, extensive irrigation debridement, removal of right total knee implants, and placement of antibiotic impregnated  polymethylmethacrylate cement spacer 04/17/2020  Dr Ernest Pine  AMPUTATION LEG ABOVE KNEE AKA Right 12/02/2020  Procedure: AMPUTATION, THIGH, THROUGH FEMUR, ANY LEVEL; Surgeon: Starr Lake, MD; Location: [facility]; Service: Orthopedics; Laterality: Right;  TRANSFER ADJACENT TISSUE LEG Right 12/02/2020  Procedure: ADJACENT TISSUE TRANSFER OR REARRANGEMENT, LEG; DEFECT 10 SQ CM OR LESS; Surgeon: Starr Lake, MD; Location: [facility]; Service: Orthopedics; Laterality: Right;  ASPIRATION/INJECTION MAJOR JOINT/BURSA KNEE Left 12/02/2020  Procedure: ARTHROCENTESIS, ASPIRATION AND/OR INJECTION, MAJOR JOINT OR BURSA, KNEE; WITHOUT ULTRASOUND GUIDANCE; Surgeon: Starr Lake, MD; Location: [facility]; Service: Orthopedics; Laterality: Left;  REMOVAL KNEE PROSTHESIS W/POSSIBLE PLACEMENT SPACER Left 01/18/2021  Procedure: REMOVAL OF PROSTHESIS, INCLUDING TOTAL KNEE PROSTHESIS, METHYLMETHACRYLATE WITH OR WITHOUT INSERTION OF SPACER, KNEE; Surgeon: Celine Mans, MD; Location: [facility]; Service: Orthopedics; Laterality: Left;  INSERTION NON-BIODEGRADABLE DRUG DELIVERY IMPLANT Left 01/18/2021  Procedure: INSERTION, KNEE, bioresorbable, biodegradable, NON-BIODEGRADABLE DRUG DELIVERY IMPLANT; Surgeon: Celine Mans, MD; Location: [facility]; Service: Orthopedics; Laterality: Left;  ABOVE KNEE LEG AMPUTATION  12/02/2020  APPENDECTOMY Est 1968  CESAREAN SECTION  CHOLECYSTECTOMY  FRACTURE SURGERY 1/21  HYSTERECTOMY partial  JOINT REPLACEMENT 2008 & 2009  TUBAL LIGATION 3/79   ALLERGIES: Allergies  Allergen Reactions  Singulair [Montelukast] Other (See Comments)  Gland swelling  Sulfa (Sulfonamide Antibiotics) Swelling  Oxycodone Dizziness and Other (See Comments)  Vancomycin Analogues Other (See Comments)  Ringing in ears  Avinza [Morphine] Itching  Darvocet A500 [Propoxyphene N-Acetaminophen] Nausea  Nickel Rash  Ultracet  [Tramadol-Acetaminophen] Rash  Vicodin [Hydrocodone-Acetaminophen] Vomiting  Vioxx [Rofecoxib] Other (See Comments)  GI   CURRENT MEDICATIONS: Current Outpatient Medications: acetaminophen (TYLENOL) 500 MG tablet, Take 1,000 mg by mouth 3 (three) times a day Twice daily per patient., PRN Not Currently Taking aspirin 81 MG EC tablet, Take 1 tablet (81 mg total) by mouth at bedtime, Taking calcium carbonate-vitamin D3 (CALTRATE 600+D) 600 mg-10 mcg (400 unit) tablet, Take 0.5 tablets by mouth 2 (two) times daily, Taking cetirizine (ZYRTEC) 10 mg capsule, Take 1 capsule (10 mg total) by mouth once daily, Taking escitalopram oxalate (LEXAPRO) 10 MG tablet, Take 1 tablet (10 mg total) by mouth once daily, Taking estradioL (ESTRACE) 0.01 % (0.1 mg/gram) vaginal cream, Place 2 g vaginally twice a week, Taking folic acid/multivit,iron,miner (MULTIVIT-IRON-MIN-FOLIC ACID ORAL), Take 1 tablet by mouth once daily, Taking gabapentin (NEURONTIN) 300 MG capsule, Take 1 capsule (300 mg total) by mouth 3 (three) times daily Phantom limb pain, Taking melatonin 3 mg tablet, Take 1 tablet (3 mg total) by mouth at bedtime, Taking multivitamin with iron (COMPLETE MULTIVITAMIN-MINERAL) tablet, Take 1 tablet by mouth, Taking multivitamin with minerals, EYE, (PRESERVISION AREDS 2) soft gel capsule, Take 1 capsule by mouth 2 (two) times daily, Taking nystatin (MYCOSTATIN) 100,000 unit/gram powder, Apply small amount topically twice daily for rash under breasts, PRN Not Currently Taking pantoprazole (PROTONIX) 40 MG DR tablet, Take 1 tablet (40 mg total) by mouth 2 (two) times daily. Please call to schedule an appt. Thanks!, Taking  polyethylene glycol (MIRALAX) powder, Take 17 g by mouth once daily Mix in 4-8ounces of fluid prior to taking., Taking sennosides-docusate (SENOKOT-S) 8.6-50 mg tablet, Take 1 tablet by mouth 2 (two) times daily For constipation, Taking lactose-reduced food (ENSURE PLUS ORAL), Take 237 mLs by  mouth 2 (two) times daily With breakfast and dinner  HPI   CLINICAL SUMMARY:  Patient post right AKA and working with prosthesis. 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 Able to do housework, improving  PAIN:  Are you having pain? Yes: NPRS scale: 5/10 Pain location: L knee Pain description: aching Aggravating factors: walking Relieving factors: rest  PRECAUTIONS: None  WEIGHT BEARING RESTRICTIONS No  FALLS:  Has patient fallen in last 6 months? No  LIVING ENVIRONMENT: Lives with: lives with their spouse Lives in: House/apartment Stairs: No Has following equipment at home: Dan Humphreys - 4 wheeled and Wheelchair (manual)  OCCUPATION: retired  PLOF: Requires assistive device for independence  PATIENT GOALS  improve standing tolerance/ pull up pants/ ambulate with SPC safely.    OBJECTIVE:   PATIENT SURVEYS:  FOTO initial 46/ goal 51.   11/2: 50  COGNITION:  Overall cognitive status: Within functional limits for tasks assessed     SENSATION: WFL  POSTURE: rounded shoulders, forward head, flexed trunk , and weight shift left  PALPATION: No tenderness along R distal residual limb.  Discussed phantom limb sensation.    LOWER EXTREMITY ROM:  B LE AROM WFL except L knee extension secondary to articulating spacer.  Did not assess L/R hip extension at this time.    LOWER EXTREMITY MMT:  MMT Right eval Left eval  Hip flexion 4/5 4/5  Hip extension    Hip abduction 4+/5 4+/5  Hip adduction 4+/5 4+/5  Hip internal rotation    Hip external rotation    Knee flexion N/A 4+/5  Knee extension N/A 4+/5  Ankle dorsiflexion  5/5  Ankle plantarflexion    Ankle inversion    Ankle eversion     (Blank rows = not tested)  GAIT: Distance walked: in gym/ //-bars Assistive device utilized: Environmental consultant - 2 wheeled Level of assistance: CGA Comments: Cuing for posture correction with mirror feedback.  Flexed posture/ heavy UE assist.    TUG: UE assist  required to stand/ use of RW.  51.9 seconds.     TODAY'S TREATMENT:  07/12/2022  Subjective: Pt. Arrived to PT with husband and donned prosthetic leg on blue mat table.  Pts. Fit of prosthetic leg was limited today and strap had to be adjusted multiple times before gait. Improved fit of prosthetic compared to previous session.   Objective:   There.ex.:  No charge   Nustep L5 10+ min. B UE/LE (consistent cadence/ no residual limb pain or tenderness reported).                Gait training:    Amb. From blue mat table to Nustep with use of RW and focus on step through gait.  SBA/CGA on R to ensure R knee mechanism locks out before wt. Bearing.  No verbal cuing required and consistent gait pattern/ step pattern.  PT adjusted/tightened fit several times.    In // bars, ambulated fwd with use of rollator and bwd with use of // bars for stability. Use of mirror for visual feedback. Cues for upright posture. CGA for safety to ensure R knee mechanism locks out before weight bearing.    Ambulate with use of rollator from  Nustep to // bars with focus on step through gait. Intermittent cueing for upright posture. SBA/CGA on R to ensure  R knee mechanism locks out before weightbearing. Moderate fatigue in B shoulders.   Ambulate with use of rollator from Nustep around PT gym (pt. Challenged with managing rollator use).  PT discussed use of tall upright rollator to decrease B shoulder pain/ issues. Cues for upright posture with patient experiencing difficulty staying upright during mobility. With time, patient able to complete smoother step pattern with use of rollator. (Not today)   Amb. From blue mat table to gym door with rollator and CGA.  Moderate fatigue in B shoulders/ L LE. (Not today)   PATIENT EDUCATION:  Education details: HEP/ gait and prosthetic training.  Person educated: Patient and Spouse Education method: Explanation, Demonstration, and Verbal cues Education comprehension: verbalized  understanding, returned demonstration, and tactile cues required   HOME EXERCISE PROGRAM: Prone position hip stretches/ Standing marching at kitchen counter with w/c behind patient.      ASSESSMENT:  CLINICAL IMPRESSION:  Patient presents to PT in w/c with prosthetic leg in hand. Highly motivated to become more independent with use of RW as well as introducing use of rollator for mobility. Continues to ambulate with good BOS/step pattern with no episodes of R knee mechanism buckling this session. Session focused on gait training in // bars with rollator for improving confidence and balance with use. Continued discussion about upright rollator with patient planning to order one in the near future. Patient will continue to benefit from skilled PT services to improve standing tolerance/ gait independence and ADL.  OBJECTIVE IMPAIRMENTS Abnormal gait, decreased activity tolerance, decreased balance, decreased endurance, decreased mobility, difficulty walking, decreased ROM, decreased strength, decreased safety awareness, impaired flexibility, improper body mechanics, prosthetic dependency , and pain.   ACTIVITY LIMITATIONS carrying, lifting, standing, squatting, stairs, transfers, bed mobility, bathing, toileting, dressing, hygiene/grooming, and locomotion level  PARTICIPATION LIMITATIONS: cleaning, laundry, driving, shopping, community activity, and yard work  PERSONAL FACTORS Past/current experiences are also affecting patient's functional outcome.   REHAB POTENTIAL: Good  CLINICAL DECISION MAKING: Evolving/moderate complexity  EVALUATION COMPLEXITY: High   GOALS: Goals reviewed with patient Yes  LONG TERM GOALS: Target date: 09/06/22  Pt. Will increase FOTO to 54 to improve functional mobility.  Baseline: initial FOTO 46.  11/2: 50.  4/25: 60  Goal status: Goal met  2.  Pt. Able to tolerate standing 10 minutes while pulling up pants with mod. I safely while wearing prosthetic leg to  improve daily activities.  Baseline:  limited standing tolerance Goal status: Partially met  3.  Pt. Will ambulate 200 feet with use of least assistive device and mod. I to promote safety with household tasks/ walking into bathroom.   Baseline:  amb. With RW and min. A for safety.  2/27: walking in clinic with SBA/mod. I for 100 feet with RW and use of prosthetic leg.  6/11: amb. With RW or rollator <100 feet with SBA for RW/ CGA for rollator.  Goal status: Not met  4.  Pt. Will ascend 10 stairs with use of single forearm crutch/ handrail and step to pattern with mod. I safely.  Baseline:  see above Goal status: Not met  PLAN: PT FREQUENCY: 2x/week  PT DURATION: 8 weeks  PLANNED INTERVENTIONS: Therapeutic exercises, Therapeutic activity, Neuromuscular re-education, Balance training, Gait training, Patient/Family education, Self Care, Joint mobilization, Prosthetic training, DME instructions, and Manual therapy  PLAN FOR NEXT SESSION:  Progress gait/ standing tolerance.  F/u on upright rollator.     Maylon Peppers, PT, DPT Physical Therapist - [place]  [facility]  11:40 AM,08/03/22

## 2022-08-09 ENCOUNTER — Ambulatory Visit: Payer: Medicare Other | Admitting: Physical Therapy

## 2022-08-10 ENCOUNTER — Ambulatory Visit
Admission: RE | Admit: 2022-08-10 | Discharge: 2022-08-10 | Disposition: A | Discharge status: Home / Self Care | Payer: Medicare Other | Source: Ambulatory Visit | Attending: Internal Medicine | Admitting: Internal Medicine

## 2022-08-10 DIAGNOSIS — Z1231 Encounter for screening mammogram for malignant neoplasm of breast: Secondary | ICD-10-CM | POA: Diagnosis present

## 2022-08-11 ENCOUNTER — Encounter: Payer: Self-pay | Admitting: Physical Therapy

## 2022-08-11 ENCOUNTER — Ambulatory Visit: Payer: Medicare Other

## 2022-08-11 DIAGNOSIS — R293 Abnormal posture: Secondary | ICD-10-CM

## 2022-08-11 DIAGNOSIS — M6281 Muscle weakness (generalized): Secondary | ICD-10-CM

## 2022-08-11 DIAGNOSIS — Z89611 Acquired absence of right leg above knee: Secondary | ICD-10-CM

## 2022-08-11 DIAGNOSIS — R269 Unspecified abnormalities of gait and mobility: Secondary | ICD-10-CM | POA: Diagnosis not present

## 2022-08-11 NOTE — Therapy (Signed)
OUTPATIENT PHYSICAL THERAPY LOWER EXTREMITY TREATMENT  Patient Name: Jasmine Morrow MRN: [id number] DOB:[birth year], 77 y.o., female Today's Date: 07/12/22   PT End of Session - 08/11/22 1031     Visit Number 68    Number of Visits 78    Date for PT Re-Evaluation 09/06/22    PT Start Time 1028    PT Stop Time 1117    PT Time Calculation (min) 49 min    Activity Tolerance Patient tolerated treatment well    Behavior During Therapy WFL for tasks assessed/performed                 Past Medical History:  Diagnosis Date   Anemia    Anxiety    Asthma    seasonal    GERD (gastroesophageal reflux disease)    H/O above knee amputation, 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   Hypertension    PONV (postoperative nausea and vomiting)    Seasonal allergies    Sleep apnea    does not uses CPAP machine anymore    Wheelchair dependent    able to self transfer   Past Surgical History:  Procedure Laterality Date   APPENDECTOMY     CATARACT EXTRACTION W/PHACO Right 02/28/2022   Procedure: CATARACT EXTRACTION PHACO AND INTRAOCULAR LENS PLACEMENT (IOC) RIGHT  4.82  00:30.9;  Surgeon: Jasmine Crane, MD;  Location: [facility];  Service: Ophthalmology;  Laterality: Right;  sleep apnea   CATARACT EXTRACTION W/PHACO Left 03/14/2022   Procedure: CATARACT EXTRACTION PHACO AND INTRAOCULAR LENS PLACEMENT (IOC) LEFT;  Surgeon: Jasmine Crane, MD;  Location: [facility];  Service: Ophthalmology;  Laterality: Left;  4.32 0:39.7   CESAREAN SECTION     CHOLECYSTECTOMY     DILATION AND CURETTAGE OF UTERUS     ESOPHAGOGASTRODUODENOSCOPY N/A 02/22/2019   Procedure: ESOPHAGOGASTRODUODENOSCOPY (EGD);  Surgeon: Toledo, Boykin Nearing, MD;  Location: [facility];  Service: Gastroenterology;  Laterality: N/A;   FLEXIBLE SIGMOIDOSCOPY N/A 02/22/2019   Procedure: FLEXIBLE SIGMOIDOSCOPY;  Surgeon: Toledo, Boykin Nearing, MD;  Location: [facility];  Service: Gastroenterology;  Laterality: N/A;    HARDWARE REMOVAL Right 11/12/2019   Procedure: Right ankle hardware removal;  Surgeon: Jasmine Bucker, MD;  Location: [facility];  Service: Orthopedics;  Laterality: Right;   HARDWARE REMOVAL Right 04/17/2020   Procedure: HARDWARE REMOVAL FROM RIGHT ANKLE AND RIGHT KNEE; IMPLANT OF CEMENT SPACERS IN RIGHT KNEE;  Surgeon: Jasmine Heinz, MD;  Location: [facility];  Service: Orthopedics;  Laterality: Right;   JOINT REPLACEMENT     LEG AMPUTATION ABOVE KNEE Right 12/02/2020   [facility]   ORIF ANKLE FRACTURE Right 03/14/2019   Procedure: OPEN REDUCTION INTERNAL FIXATION (ORIF) ANKLE FRACTURE, MEDIAL MALLEOLUS;  Surgeon: Jasmine Bucker, MD;  Location: [facility];  Service: Orthopedics;  Laterality: Right;   ORIF ANKLE FRACTURE Right 02/08/2019   Procedure: OPEN REDUCTION INTERNAL FIXATION (ORIF) ANKLE FRACTURE, post malleolus;  Surgeon: Jasmine Bucker, MD;  Location: [facility];  Service: Orthopedics;  Laterality: Right;   PARTIAL HYSTERECTOMY  1993   REPLACEMENT TOTAL KNEE Bilateral 2008   2009   SCAR DEBRIDEMENT OF TOTAL KNEE  01/2020   SYNDESMOSIS REPAIR Right 02/08/2019   Procedure: SYNDESMOSIS REPAIR;  Surgeon: Jasmine Bucker, MD;  Location: [facility];  Service: Orthopedics;  Laterality: Right;   TEE WITHOUT CARDIOVERSION N/A 02/26/2019   Procedure: TRANSESOPHAGEAL ECHOCARDIOGRAM (TEE);  Surgeon: Jasmine Blinks, MD;  Location: [facility];  Service: Cardiovascular;  Laterality: N/A;   TOTAL KNEE REVISION WITH SCAR  DEBRIDEMENT/PATELLA REVISION WITH POLY EXCHANGE Right 01/24/2020   Procedure: TOTAL KNEE REVISION WITH SCAR DEBRIDEMENT/PATELLA REVISION WITH POLY EXCHANGE;  Surgeon: Jasmine Heinz, MD;  Location: [facility];  Service: Orthopedics;  Laterality: Right;   TUMOR REMOVAL     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   Patient Active Problem List   Diagnosis Date Noted   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   Anxiety 04/17/2020   History of GI diverticular bleed 04/17/2020   Chest pain  04/17/2020   PAD (peripheral artery disease) (HCC) 04/05/2020   Ulcer of right leg (HCC) 04/05/2020   COPD (chronic obstructive pulmonary disease) (HCC) 04/05/2020   GERD (gastroesophageal reflux disease) 04/05/2020   Abscess of right knee 01/22/2020   Pressure injury of skin 02/28/2019   SOB (shortness of breath)    Gastrointestinal hemorrhage    Sepsis (HCC)    Symptomatic anemia 02/20/2019   Trimalleolar fracture of ankle, closed, right, initial encounter 02/07/2019   Multiple lung nodules 03/31/2014   Extrinsic asthma 03/27/2014   OSA on CPAP 03/27/2014    PCP: Jasmine Penton, MD  REFERRING PROVIDER: Danella Penton, MD  REFERRING DIAG: Above knee amputation of right lower extremity  THERAPY DIAG:  Gait difficulty  Muscle weakness (generalized)  Abnormal posture  S/P AKA (above knee amputation) unilateral, right [facility])  Rationale for Evaluation and Treatment Rehabilitation  ONSET DATE: 12/02/20  SUBJECTIVE:  EVALUATION  PERTINENT HISTORY: Patient Profile:   Jasmine Morrow is a 77 y.o. female Chief Complaint  Patient presents with  Visit Follow Up  Doing well.   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 Cardiac cath 10/2011 showed completely normal coronary arteries, however she did have mild left ventricular dysfunction with anterior wall motion abnormality. Left ventricular  EF was 51%.  History of chickenpox  Hyperlipidemia  patient states she has never been diagnosed with this  Hypertension  Knee joint replacement by other means  Macular degeneration  Major depressive disorder, recurrent, mild (CMS-HCC)  Medicare annual wellness visit, initial 08/09/2019  7/21  Multiple thyroid nodules  Normal coronary arteries 09/06/2013  Normal coronary anatomy by cardiac catheterization 10/12/11  Osteoarthritis  Ovarian cyst  PONV (postoperative nausea and vomiting)  nausea  Postoperative urinary retention 12/08/2020  Rash on lips 12/21/2020  Seasonal allergies  Sinusitis, unspecified  Sleep apnea  On CPAP.  Slow transit constipation 12/10/2020  Trimalleolar fracture of ankle, closed, right, initial encounter 02/07/2019  Complicated by staph infection 1/21, 4 weeks IV Ancef, Menz  Ulcer   Past Surgical History:  Procedure Laterality Date  SALPINGO OOPHORECTOMY Bilateral 1993  Right total knee arthroplasty 10/11/2006  Dr Ernest Pine  Left total knee arthroplasty 10/12/2007  Dr Ernest Pine  COLONOSCOPY 07/30/2012  internal hemorrhoids, diverticulosis  ANKLE ARTHODESIS W/ ARTHROSCOPY Right 02/08/2019  03/14/2019 second surgery  COLONOSCOPY 02/22/2019  Blood in entire colon/Diverticulosis - Presumed diverticular bleed. No repeat recommended per TKT.  EGD 02/22/2019  Gastritis/gastric ulcers/Hiatal hernia/Otherwise normal -  no repeat recommended per TKT.  ORIF ANKLE FRACTURE Right 03/14/2019  Dr. Rosita Kea  REMOVAL HARDWARE ANKLE FOOT/TOES Right 11/12/2019  Dr. Rosita Kea  Right knee arthrotomy, irrigation and debridement of the right knee, polyethylene exchange 01/24/2020  Dr Ernest Pine  Removal of hardware (plates and screws) from the right ankle with irrigation, debridement, and placement of Stimulan antibiotic beads 04/17/2020  Dr Ernest Pine  Right knee arthrotomy, extensive irrigation debridement, removal of right total knee implants, and placement of antibiotic impregnated  polymethylmethacrylate cement spacer 04/17/2020  Dr Ernest Pine  AMPUTATION LEG ABOVE KNEE AKA Right 12/02/2020  Procedure: AMPUTATION, THIGH, THROUGH FEMUR, ANY LEVEL; Surgeon: Starr Lake, MD; Location: [facility]; Service: Orthopedics; Laterality: Right;  TRANSFER ADJACENT TISSUE LEG Right 12/02/2020  Procedure: ADJACENT TISSUE TRANSFER OR REARRANGEMENT, LEG; DEFECT 10 SQ CM OR LESS; Surgeon: Starr Lake, MD; Location: [facility]; Service: Orthopedics; Laterality: Right;  ASPIRATION/INJECTION MAJOR JOINT/BURSA KNEE Left 12/02/2020  Procedure: ARTHROCENTESIS, ASPIRATION AND/OR INJECTION, MAJOR JOINT OR BURSA, KNEE; WITHOUT ULTRASOUND GUIDANCE; Surgeon: Starr Lake, MD; Location: [facility]; Service: Orthopedics; Laterality: Left;  REMOVAL KNEE PROSTHESIS W/POSSIBLE PLACEMENT SPACER Left 01/18/2021  Procedure: REMOVAL OF PROSTHESIS, INCLUDING TOTAL KNEE PROSTHESIS, METHYLMETHACRYLATE WITH OR WITHOUT INSERTION OF SPACER, KNEE; Surgeon: Celine Mans, MD; Location: [facility]; Service: Orthopedics; Laterality: Left;  INSERTION NON-BIODEGRADABLE DRUG DELIVERY IMPLANT Left 01/18/2021  Procedure: INSERTION, KNEE, bioresorbable, biodegradable, NON-BIODEGRADABLE DRUG DELIVERY IMPLANT; Surgeon: Celine Mans, MD; Location: [facility]; Service: Orthopedics; Laterality: Left;  ABOVE KNEE LEG AMPUTATION  12/02/2020  APPENDECTOMY Est 1968  CESAREAN SECTION  CHOLECYSTECTOMY  FRACTURE SURGERY 1/21  HYSTERECTOMY partial  JOINT REPLACEMENT 2008 & 2009  TUBAL LIGATION 3/79   ALLERGIES: Allergies  Allergen Reactions  Singulair [Montelukast] Other (See Comments)  Gland swelling  Sulfa (Sulfonamide Antibiotics) Swelling  Oxycodone Dizziness and Other (See Comments)  Vancomycin Analogues Other (See Comments)  Ringing in ears  Avinza [Morphine] Itching  Darvocet A500 [Propoxyphene N-Acetaminophen] Nausea  Nickel Rash  Ultracet  [Tramadol-Acetaminophen] Rash  Vicodin [Hydrocodone-Acetaminophen] Vomiting  Vioxx [Rofecoxib] Other (See Comments)  GI   CURRENT MEDICATIONS: Current Outpatient Medications: acetaminophen (TYLENOL) 500 MG tablet, Take 1,000 mg by mouth 3 (three) times a day Twice daily per patient., PRN Not Currently Taking aspirin 81 MG EC tablet, Take 1 tablet (81 mg total) by mouth at bedtime, Taking calcium carbonate-vitamin D3 (CALTRATE 600+D) 600 mg-10 mcg (400 unit) tablet, Take 0.5 tablets by mouth 2 (two) times daily, Taking cetirizine (ZYRTEC) 10 mg capsule, Take 1 capsule (10 mg total) by mouth once daily, Taking escitalopram oxalate (LEXAPRO) 10 MG tablet, Take 1 tablet (10 mg total) by mouth once daily, Taking estradioL (ESTRACE) 0.01 % (0.1 mg/gram) vaginal cream, Place 2 g vaginally twice a week, Taking folic acid/multivit,iron,miner (MULTIVIT-IRON-MIN-FOLIC ACID ORAL), Take 1 tablet by mouth once daily, Taking gabapentin (NEURONTIN) 300 MG capsule, Take 1 capsule (300 mg total) by mouth 3 (three) times daily Phantom limb pain, Taking melatonin 3 mg tablet, Take 1 tablet (3 mg total) by mouth at bedtime, Taking multivitamin with iron (COMPLETE MULTIVITAMIN-MINERAL) tablet, Take 1 tablet by mouth, Taking multivitamin with minerals, EYE, (PRESERVISION AREDS 2) soft gel capsule, Take 1 capsule by mouth 2 (two) times daily, Taking nystatin (MYCOSTATIN) 100,000 unit/gram powder, Apply small amount topically twice daily for rash under breasts, PRN Not Currently Taking pantoprazole (PROTONIX) 40 MG DR tablet, Take 1 tablet (40 mg total) by mouth 2 (two) times daily. Please call to schedule an appt. Thanks!,  Taking polyethylene glycol (MIRALAX) powder, Take 17 g by mouth once daily Mix in 4-8ounces of fluid prior to taking., Taking sennosides-docusate (SENOKOT-S) 8.6-50 mg tablet, Take 1 tablet by mouth 2 (two) times daily For constipation, Taking lactose-reduced food (ENSURE PLUS ORAL), Take 237 mLs by  mouth 2 (two) times daily With breakfast and dinner  HPI   CLINICAL SUMMARY:  Patient post right AKA and working with prosthesis. 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 Able to do housework, improving  PAIN:  Are you having pain? Yes: NPRS scale: 5/10 Pain location: L knee Pain description: aching Aggravating factors: walking Relieving factors: rest  PRECAUTIONS: None  WEIGHT BEARING RESTRICTIONS No  FALLS:  Has patient fallen in last 6 months? No  LIVING ENVIRONMENT: Lives with: lives with their spouse Lives in: House/apartment Stairs: No Has following equipment at home: Dan Humphreys - 4 wheeled and Wheelchair (manual)  OCCUPATION: retired  PLOF: Requires assistive device for independence  PATIENT GOALS  improve standing tolerance/ pull up pants/ ambulate with SPC safely.    OBJECTIVE:   PATIENT SURVEYS:  FOTO initial 46/ goal 80.   11/2: 50  COGNITION:  Overall cognitive status: Within functional limits for tasks assessed     SENSATION: WFL  POSTURE: rounded shoulders, forward head, flexed trunk , and weight shift left  PALPATION: No tenderness along R distal residual limb.  Discussed phantom limb sensation.    LOWER EXTREMITY ROM:  B LE AROM WFL except L knee extension secondary to articulating spacer.  Did not assess L/R hip extension at this time.    LOWER EXTREMITY MMT:  MMT Right eval Left eval  Hip flexion 4/5 4/5  Hip extension    Hip abduction 4+/5 4+/5  Hip adduction 4+/5 4+/5  Hip internal rotation    Hip external rotation    Knee flexion N/A 4+/5  Knee extension N/A 4+/5  Ankle dorsiflexion  5/5  Ankle plantarflexion    Ankle inversion    Ankle eversion     (Blank rows = not tested)  GAIT: Distance walked: in gym/ //-bars Assistive device utilized: Environmental consultant - 2 wheeled Level of assistance: CGA Comments: Cuing for posture correction with mirror feedback.  Flexed posture/ heavy UE assist.    TUG: UE assist  required to stand/ use of RW.  51.9 seconds.     TODAY'S TREATMENT:  07/12/2022  Subjective: Pt. Arrived to PT with husband and donned prosthetic leg on blue mat table.  Pts. Fit of prosthetic leg was limited today and strap had to be adjusted multiple times before gait. Improved fit of prosthetic compared to previous session. Has received her upright rollator but has not assembled it yet. Is currently on prednisone for upper respiratory infection.    Objective:   There.ex.:  No charge   Nustep L5 10+ min. B UE/LE (consistent cadence/ no residual limb pain or tenderness reported).                Gait training:    Amb. From blue mat table to Nustep with use of RW and focus on step through gait.  SBA/CGA on R to ensure R knee mechanism locks out before wt. Bearing.  No verbal cuing required and consistent gait pattern/ step pattern.  PT adjusted/tightened fit several times.    Ambulate with use of rollator from Nustep to // bars with focus on step through gait. Intermittent cueing for upright posture. SBA/CGA on R to ensure  R knee  mechanism locks out before weightbearing. Moderate fatigue in B shoulders.    In // bars, ambulated fwd with use of rollator and bwd with use of // bars for stability. Use of mirror for visual feedback. Cues for upright posture. CGA for safety to ensure R knee mechanism locks out before weight bearing.    Ambulate with use of rollator from Nustep around PT gym (pt. Challenged with managing rollator use).  PT discussed use of tall upright rollator to decrease B shoulder pain/ issues. Cues for upright posture with patient experiencing difficulty staying upright during mobility. With time, patient able to complete smoother step pattern with use of rollator. (Not today)   Amb. From blue mat table to gym door with rollator and CGA.  Moderate fatigue in B shoulders/ L LE. (Not today)   PATIENT EDUCATION:  Education details: HEP/ gait and prosthetic training.  Person  educated: Patient and Spouse Education method: Explanation, Demonstration, and Verbal cues Education comprehension: verbalized understanding, returned demonstration, and tactile cues required   HOME EXERCISE PROGRAM: Prone position hip stretches/ Standing marching at kitchen counter with w/c behind patient.      ASSESSMENT:  CLINICAL IMPRESSION:   Patient presents to PT in w/c with prosthetic leg in hand. Highly motivated to become more independent with use of RW as well as introducing use of rollator for mobility. Continues to ambulate with good BOS/step pattern with no episodes of R knee mechanism buckling this session. Session focused on gait training in // bars with rollator for improving confidence and balance with use. Continued discussion about upright rollator with patient planning to order one in the near future. Patient will continue to benefit from skilled PT services to improve standing tolerance/ gait independence and ADL.  OBJECTIVE IMPAIRMENTS Abnormal gait, decreased activity tolerance, decreased balance, decreased endurance, decreased mobility, difficulty walking, decreased ROM, decreased strength, decreased safety awareness, impaired flexibility, improper body mechanics, prosthetic dependency , and pain.   ACTIVITY LIMITATIONS carrying, lifting, standing, squatting, stairs, transfers, bed mobility, bathing, toileting, dressing, hygiene/grooming, and locomotion level  PARTICIPATION LIMITATIONS: cleaning, laundry, driving, shopping, community activity, and yard work  PERSONAL FACTORS Past/current experiences are also affecting patient's functional outcome.   REHAB POTENTIAL: Good  CLINICAL DECISION MAKING: Evolving/moderate complexity  EVALUATION COMPLEXITY: High   GOALS: Goals reviewed with patient Yes  LONG TERM GOALS: Target date: 09/06/22  Pt. Will increase FOTO to 54 to improve functional mobility.  Baseline: initial FOTO 46.  11/2: 50.  4/25: 60  Goal status:  Goal met  2.  Pt. Able to tolerate standing 10 minutes while pulling up pants with mod. I safely while wearing prosthetic leg to improve daily activities.  Baseline:  limited standing tolerance Goal status: Partially met  3.  Pt. Will ambulate 200 feet with use of least assistive device and mod. I to promote safety with household tasks/ walking into bathroom.   Baseline:  amb. With RW and min. A for safety.  2/27: walking in clinic with SBA/mod. I for 100 feet with RW and use of prosthetic leg.  6/11: amb. With RW or rollator <100 feet with SBA for RW/ CGA for rollator.  Goal status: Not met  4.  Pt. Will ascend 10 stairs with use of single forearm crutch/ handrail and step to pattern with mod. I safely.  Baseline:  see above Goal status: Not met  PLAN: PT FREQUENCY: 2x/week  PT DURATION: 8 weeks  PLANNED INTERVENTIONS: Therapeutic exercises, Therapeutic activity, Neuromuscular re-education, Balance training, Gait training,  Patient/Family education, Self Care, Joint mobilization, Prosthetic training, DME instructions, and Manual therapy  PLAN FOR NEXT SESSION:  Progress gait/ standing tolerance.  F/u on upright rollator.     Maylon Peppers, PT, DPT Physical Therapist - [place]  [facility]  11:51 AM,08/11/22

## 2022-08-15 ENCOUNTER — Ambulatory Visit: Payer: Medicare Other | Admitting: Physical Therapy

## 2022-08-15 ENCOUNTER — Encounter: Payer: Self-pay | Admitting: Physical Therapy

## 2022-08-15 DIAGNOSIS — M6281 Muscle weakness (generalized): Secondary | ICD-10-CM

## 2022-08-15 DIAGNOSIS — R269 Unspecified abnormalities of gait and mobility: Secondary | ICD-10-CM

## 2022-08-15 DIAGNOSIS — R293 Abnormal posture: Secondary | ICD-10-CM

## 2022-08-15 DIAGNOSIS — Z89611 Acquired absence of right leg above knee: Secondary | ICD-10-CM

## 2022-08-15 NOTE — Therapy (Signed)
OUTPATIENT PHYSICAL THERAPY LOWER EXTREMITY TREATMENT  Patient Name: Jasmine Morrow MRN: [id number] DOB:1945-03-01, 77 y.o., female Today's Date: 08/15/2022    PT End of Session - 08/15/22 1543     Visit Number 69    Number of Visits 78    Date for PT Re-Evaluation 09/06/22    PT Start Time 1434    PT Stop Time 1520    PT Time Calculation (min) 46 min    Activity Tolerance Patient tolerated treatment well    Behavior During Therapy WFL for tasks assessed/performed             Past Medical History:  Diagnosis Date   Anemia    Anxiety    Asthma    seasonal    GERD (gastroesophageal reflux disease)    H/O above knee amputation, 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   Hypertension    PONV (postoperative nausea and vomiting)    Seasonal allergies    Sleep apnea    does not uses CPAP machine anymore    Wheelchair dependent    able to self transfer   Past Surgical History:  Procedure Laterality Date   APPENDECTOMY     CATARACT EXTRACTION W/PHACO Right 02/28/2022   Procedure: CATARACT EXTRACTION PHACO AND INTRAOCULAR LENS PLACEMENT (IOC) RIGHT  4.82  00:30.9;  Surgeon: Nevada Crane, MD;  Location: [facility];  Service: Ophthalmology;  Laterality: Right;  sleep apnea   CATARACT EXTRACTION W/PHACO Left 03/14/2022   Procedure: CATARACT EXTRACTION PHACO AND INTRAOCULAR LENS PLACEMENT (IOC) LEFT;  Surgeon: Nevada Crane, MD;  Location: [facility];  Service: Ophthalmology;  Laterality: Left;  4.32 0:39.7   CESAREAN SECTION     CHOLECYSTECTOMY     DILATION AND CURETTAGE OF UTERUS     ESOPHAGOGASTRODUODENOSCOPY N/A 02/22/2019   Procedure: ESOPHAGOGASTRODUODENOSCOPY (EGD);  Surgeon: Toledo, Boykin Nearing, MD;  Location: [facility];  Service: Gastroenterology;  Laterality: N/A;   FLEXIBLE SIGMOIDOSCOPY N/A 02/22/2019   Procedure: FLEXIBLE SIGMOIDOSCOPY;  Surgeon: Toledo, Boykin Nearing, MD;  Location: [facility];  Service: Gastroenterology;  Laterality: N/A;   HARDWARE  REMOVAL Right 11/12/2019   Procedure: Right ankle hardware removal;  Surgeon: Kennedy Bucker, MD;  Location: [facility];  Service: Orthopedics;  Laterality: Right;   HARDWARE REMOVAL Right 04/17/2020   Procedure: HARDWARE REMOVAL FROM RIGHT ANKLE AND RIGHT KNEE; IMPLANT OF CEMENT SPACERS IN RIGHT KNEE;  Surgeon: Donato Heinz, MD;  Location: [facility];  Service: Orthopedics;  Laterality: Right;   JOINT REPLACEMENT     LEG AMPUTATION ABOVE KNEE Right 12/02/2020   [facility]   ORIF ANKLE FRACTURE Right 03/14/2019   Procedure: OPEN REDUCTION INTERNAL FIXATION (ORIF) ANKLE FRACTURE, MEDIAL MALLEOLUS;  Surgeon: Kennedy Bucker, MD;  Location: [facility];  Service: Orthopedics;  Laterality: Right;   ORIF ANKLE FRACTURE Right 02/08/2019   Procedure: OPEN REDUCTION INTERNAL FIXATION (ORIF) ANKLE FRACTURE, post malleolus;  Surgeon: Kennedy Bucker, MD;  Location: [facility];  Service: Orthopedics;  Laterality: Right;   PARTIAL HYSTERECTOMY  1993   REPLACEMENT TOTAL KNEE Bilateral 2008   2009   SCAR DEBRIDEMENT OF TOTAL KNEE  01/2020   SYNDESMOSIS REPAIR Right 02/08/2019   Procedure: SYNDESMOSIS REPAIR;  Surgeon: Kennedy Bucker, MD;  Location: [facility];  Service: Orthopedics;  Laterality: Right;   TEE WITHOUT CARDIOVERSION N/A 02/26/2019   Procedure: TRANSESOPHAGEAL ECHOCARDIOGRAM (TEE);  Surgeon: Lamar Blinks, MD;  Location: [facility];  Service: Cardiovascular;  Laterality: N/A;   TOTAL KNEE REVISION WITH SCAR DEBRIDEMENT/PATELLA REVISION WITH  POLY EXCHANGE Right 01/24/2020   Procedure: TOTAL KNEE REVISION WITH SCAR DEBRIDEMENT/PATELLA REVISION WITH POLY EXCHANGE;  Surgeon: Donato Heinz, MD;  Location: [facility];  Service: Orthopedics;  Laterality: Right;   TUMOR REMOVAL     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   Patient Active Problem List   Diagnosis Date Noted   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   Anxiety 04/17/2020   History of GI diverticular bleed 04/17/2020   Chest pain 04/17/2020    PAD (peripheral artery disease) (HCC) 04/05/2020   Ulcer of right leg (HCC) 04/05/2020   COPD (chronic obstructive pulmonary disease) (HCC) 04/05/2020   GERD (gastroesophageal reflux disease) 04/05/2020   Abscess of right knee 01/22/2020   Pressure injury of skin 02/28/2019   SOB (shortness of breath)    Gastrointestinal hemorrhage    Sepsis (HCC)    Symptomatic anemia 02/20/2019   Trimalleolar fracture of ankle, closed, right, initial encounter 02/07/2019   Multiple lung nodules 03/31/2014   Extrinsic asthma 03/27/2014   OSA on CPAP 03/27/2014    PCP: Danella Penton, MD  REFERRING PROVIDER: Danella Penton, MD  REFERRING DIAG: Above knee amputation of right lower extremity  THERAPY DIAG:  Gait difficulty  Muscle weakness (generalized)  Abnormal posture  S/P AKA (above knee amputation) unilateral, right [facility])  Rationale for Evaluation and Treatment Rehabilitation  ONSET DATE: 12/02/20  SUBJECTIVE:  EVALUATION  PERTINENT HISTORY: Patient Profile:   Jasmine Morrow is a 77 y.o. female Chief Complaint  Patient presents with  Visit Follow Up  Doing well.   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 Cardiac cath 10/2011 showed completely normal coronary arteries, however she did have mild left ventricular dysfunction with anterior wall motion abnormality. Left ventricular EF was 51%.   History of chickenpox  Hyperlipidemia  patient states she has never been diagnosed with this  Hypertension  Knee joint replacement by other means  Macular degeneration  Major depressive disorder, recurrent, mild (CMS-HCC)  Medicare annual wellness visit, initial 08/09/2019  7/21  Multiple thyroid nodules  Normal coronary arteries 09/06/2013  Normal coronary anatomy by cardiac catheterization 10/12/11  Osteoarthritis  Ovarian cyst  PONV (postoperative nausea and vomiting)  nausea  Postoperative urinary retention 12/08/2020  Rash on lips 12/21/2020  Seasonal allergies  Sinusitis, unspecified  Sleep apnea  On CPAP.  Slow transit constipation 12/10/2020  Trimalleolar fracture of ankle, closed, right, initial encounter 02/07/2019  Complicated by staph infection 1/21, 4 weeks IV Ancef, Menz  Ulcer   Past Surgical History:  Procedure Laterality Date  SALPINGO OOPHORECTOMY Bilateral 1993  Right total knee arthroplasty 10/11/2006  Dr Ernest Pine  Left total knee arthroplasty 10/12/2007  Dr Ernest Pine  COLONOSCOPY 07/30/2012  internal hemorrhoids, diverticulosis  ANKLE ARTHODESIS W/ ARTHROSCOPY Right 02/08/2019  03/14/2019 second surgery  COLONOSCOPY 02/22/2019  Blood in entire colon/Diverticulosis - Presumed diverticular bleed. No repeat recommended per TKT.  EGD 02/22/2019  Gastritis/gastric ulcers/Hiatal hernia/Otherwise normal - no repeat recommended  per TKT.  ORIF ANKLE FRACTURE Right 03/14/2019  Dr. Rosita Kea  REMOVAL HARDWARE ANKLE FOOT/TOES Right 11/12/2019  Dr. Rosita Kea  Right knee arthrotomy, irrigation and debridement of the right knee, polyethylene exchange 01/24/2020  Dr Ernest Pine  Removal of hardware (plates and screws) from the right ankle with irrigation, debridement, and placement of Stimulan antibiotic beads 04/17/2020  Dr Ernest Pine  Right knee arthrotomy, extensive irrigation debridement, removal of right total knee implants, and placement of antibiotic impregnated  polymethylmethacrylate cement spacer 04/17/2020  Dr Ernest Pine  AMPUTATION LEG ABOVE KNEE AKA Right 12/02/2020  Procedure: AMPUTATION, THIGH, THROUGH FEMUR, ANY LEVEL; Surgeon: Starr Lake, MD; Location: [facility]; Service: Orthopedics; Laterality: Right;  TRANSFER ADJACENT TISSUE LEG Right 12/02/2020  Procedure: ADJACENT TISSUE TRANSFER OR REARRANGEMENT, LEG; DEFECT 10 SQ CM OR LESS; Surgeon: Starr Lake, MD; Location: [facility]; Service: Orthopedics; Laterality: Right;  ASPIRATION/INJECTION MAJOR JOINT/BURSA KNEE Left 12/02/2020  Procedure: ARTHROCENTESIS, ASPIRATION AND/OR INJECTION, MAJOR JOINT OR BURSA, KNEE; WITHOUT ULTRASOUND GUIDANCE; Surgeon: Starr Lake, MD; Location: [facility]; Service: Orthopedics; Laterality: Left;  REMOVAL KNEE PROSTHESIS W/POSSIBLE PLACEMENT SPACER Left 01/18/2021  Procedure: REMOVAL OF PROSTHESIS, INCLUDING TOTAL KNEE PROSTHESIS, METHYLMETHACRYLATE WITH OR WITHOUT INSERTION OF SPACER, KNEE; Surgeon: Celine Mans, MD; Location: [facility]; Service: Orthopedics; Laterality: Left;  INSERTION NON-BIODEGRADABLE DRUG DELIVERY IMPLANT Left 01/18/2021  Procedure: INSERTION, KNEE, bioresorbable, biodegradable, NON-BIODEGRADABLE DRUG DELIVERY IMPLANT; Surgeon: Celine Mans, MD; Location: [facility]; Service: Orthopedics; Laterality: Left;  ABOVE KNEE LEG AMPUTATION  12/02/2020  APPENDECTOMY Est 1968  CESAREAN SECTION  CHOLECYSTECTOMY  FRACTURE SURGERY 1/21  HYSTERECTOMY partial  JOINT REPLACEMENT 2008 & 2009  TUBAL LIGATION 3/79   ALLERGIES: Allergies  Allergen Reactions  Singulair [Montelukast] Other (See Comments)  Gland swelling  Sulfa (Sulfonamide Antibiotics) Swelling  Oxycodone Dizziness and Other (See Comments)  Vancomycin Analogues Other (See Comments)  Ringing in ears  Avinza [Morphine] Itching  Darvocet A500 [Propoxyphene N-Acetaminophen] Nausea  Nickel Rash  Ultracet  [Tramadol-Acetaminophen] Rash  Vicodin [Hydrocodone-Acetaminophen] Vomiting  Vioxx [Rofecoxib] Other (See Comments)  GI   CURRENT MEDICATIONS: Current Outpatient Medications: acetaminophen (TYLENOL) 500 MG tablet, Take 1,000 mg by mouth 3 (three) times a day Twice daily per patient., PRN Not Currently Taking aspirin 81 MG EC tablet, Take 1 tablet (81 mg total) by mouth at bedtime, Taking calcium carbonate-vitamin D3 (CALTRATE 600+D) 600 mg-10 mcg (400 unit) tablet, Take 0.5 tablets by mouth 2 (two) times daily, Taking cetirizine (ZYRTEC) 10 mg capsule, Take 1 capsule (10 mg total) by mouth once daily, Taking escitalopram oxalate (LEXAPRO) 10 MG tablet, Take 1 tablet (10 mg total) by mouth once daily, Taking estradioL (ESTRACE) 0.01 % (0.1 mg/gram) vaginal cream, Place 2 g vaginally twice a week, Taking folic acid/multivit,iron,miner (MULTIVIT-IRON-MIN-FOLIC ACID ORAL), Take 1 tablet by mouth once daily, Taking gabapentin (NEURONTIN) 300 MG capsule, Take 1 capsule (300 mg total) by mouth 3 (three) times daily Phantom limb pain, Taking melatonin 3 mg tablet, Take 1 tablet (3 mg total) by mouth at bedtime, Taking multivitamin with iron (COMPLETE MULTIVITAMIN-MINERAL) tablet, Take 1 tablet by mouth, Taking multivitamin with minerals, EYE, (PRESERVISION AREDS 2) soft gel capsule, Take 1 capsule by mouth 2 (two) times daily, Taking nystatin (MYCOSTATIN) 100,000 unit/gram powder, Apply small amount topically twice daily for rash under breasts, PRN Not Currently Taking pantoprazole (PROTONIX) 40 MG DR tablet, Take 1 tablet (40 mg total) by mouth 2 (two) times daily. Please call to schedule an appt. Thanks!, Taking polyethylene glycol (  MIRALAX) powder, Take 17 g by mouth once daily Mix in 4-8ounces of fluid prior to taking., Taking sennosides-docusate (SENOKOT-S) 8.6-50 mg tablet, Take 1 tablet by mouth 2 (two) times daily For constipation, Taking lactose-reduced food (ENSURE PLUS ORAL), Take 237 mLs by  mouth 2 (two) times daily With breakfast and dinner  HPI   CLINICAL SUMMARY:  Patient post right AKA and working with prosthesis. 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 Able to do housework, improving  PAIN:  Are you having pain? Yes: NPRS scale: 5/10 Pain location: L knee Pain description: aching Aggravating factors: walking Relieving factors: rest  PRECAUTIONS: None  WEIGHT BEARING RESTRICTIONS No  FALLS:  Has patient fallen in last 6 months? No  LIVING ENVIRONMENT: Lives with: lives with their spouse Lives in: House/apartment Stairs: No Has following equipment at home: Dan Humphreys - 4 wheeled and Wheelchair (manual)  OCCUPATION: retired  PLOF: Requires assistive device for independence  PATIENT GOALS  improve standing tolerance/ pull up pants/ ambulate with SPC safely.    OBJECTIVE:   PATIENT SURVEYS:  FOTO initial 46/ goal 72.   11/2: 50  COGNITION:  Overall cognitive status: Within functional limits for tasks assessed     SENSATION: WFL  POSTURE: rounded shoulders, forward head, flexed trunk , and weight shift left  PALPATION: No tenderness along R distal residual limb.  Discussed phantom limb sensation.    LOWER EXTREMITY ROM:  B LE AROM WFL except L knee extension secondary to articulating spacer.  Did not assess L/R hip extension at this time.    LOWER EXTREMITY MMT:  MMT Right eval Left eval  Hip flexion 4/5 4/5  Hip extension    Hip abduction 4+/5 4+/5  Hip adduction 4+/5 4+/5  Hip internal rotation    Hip external rotation    Knee flexion N/A 4+/5  Knee extension N/A 4+/5  Ankle dorsiflexion  5/5  Ankle plantarflexion    Ankle inversion    Ankle eversion     (Blank rows = not tested)  GAIT: Distance walked: in gym/ //-bars Assistive device utilized: Environmental consultant - 2 wheeled Level of assistance: CGA Comments: Cuing for posture correction with mirror feedback.  Flexed posture/ heavy UE assist.    TUG: UE assist  required to stand/ use of RW.  51.9 seconds.     TODAY'S TREATMENT:  07/12/2022  Subjective: Pt. Arrived to PT with husband and donned prosthetic leg on blue mat table.  Pts. Fit of prosthetic leg was limited today and strap had to be adjusted multiple times before gait. Improved fit of prosthetic compared to previous session. Pt. Completed Prednisone for upper respiratory infection and doing better but a little short of breath with increase activity.  Pt. Discussed that she is planning to go to beach in 2 months and has to complete 14 stairs to enter beach house.    Objective:   There.ex.:  No charge   Nustep L5 10+ min. B UE/LE (consistent cadence/ no residual limb pain or tenderness reported).                Gait training:    Amb. From blue mat table to Nustep with use of rollator and focus on step through gait.  SBA/CGA on R to ensure R knee mechanism locks out before wt. Bearing.  No verbal cuing required and consistent gait pattern/ step pattern.  PT adjusted/tightened fit several times.    Ambulate with use of rollator from Nustep to //  bars with focus on step through gait. Intermittent cueing for upright posture. SBA/CGA on R to ensure  R knee mechanism locks out before weightbearing. Moderate fatigue in B shoulders.    In // bars, ambulated fwd with use of rollator and bwd with use of // bars for stability. Use of mirror for visual feedback. Cues for upright posture. CGA for safety to ensure R knee mechanism locks out before weight bearing.     Amb. With RW from //-bars around gym 1x and pt. Required seated rest break in w/c at gym door prior to returning to car.  Pt. Limited by increase B shoulder muscle fatigue/ discomfort.  No episodes of R knee mechanism buckling.    PATIENT EDUCATION:  Education details: HEP/ gait and prosthetic training.  Person educated: Patient and Spouse Education method: Explanation, Demonstration, and Verbal cues Education comprehension: verbalized  understanding, returned demonstration, and tactile cues required   HOME EXERCISE PROGRAM: Prone position hip stretches/ Standing marching at kitchen counter with w/c behind patient.      ASSESSMENT:  CLINICAL IMPRESSION:   Patient presents to PT in w/c with prosthetic leg in hand. Highly motivated to become more independent with use of RW as well as introducing use of rollator for mobility. Continues to ambulate with good BOS/step pattern with no episodes of R knee mechanism buckling this session. Session focused on gait training in // bars with rollator for improving confidence and balance with use. Continued discussion about upright rollator with patient planning to order one in the near future. Patient will continue to benefit from skilled PT services to improve standing tolerance/ gait independence and ADL.  OBJECTIVE IMPAIRMENTS Abnormal gait, decreased activity tolerance, decreased balance, decreased endurance, decreased mobility, difficulty walking, decreased ROM, decreased strength, decreased safety awareness, impaired flexibility, improper body mechanics, prosthetic dependency , and pain.   ACTIVITY LIMITATIONS carrying, lifting, standing, squatting, stairs, transfers, bed mobility, bathing, toileting, dressing, hygiene/grooming, and locomotion level  PARTICIPATION LIMITATIONS: cleaning, laundry, driving, shopping, community activity, and yard work  PERSONAL FACTORS Past/current experiences are also affecting patient's functional outcome.   REHAB POTENTIAL: Good  CLINICAL DECISION MAKING: Evolving/moderate complexity  EVALUATION COMPLEXITY: High   GOALS: Goals reviewed with patient Yes  LONG TERM GOALS: Target date: 09/06/22  Pt. Will increase FOTO to 54 to improve functional mobility.  Baseline: initial FOTO 46.  11/2: 50.  4/25: 60  Goal status: Goal met  2.  Pt. Able to tolerate standing 10 minutes while pulling up pants with mod. I safely while wearing prosthetic leg to  improve daily activities.  Baseline:  limited standing tolerance Goal status: Partially met  3.  Pt. Will ambulate 200 feet with use of least assistive device and mod. I to promote safety with household tasks/ walking into bathroom.   Baseline:  amb. With RW and min. A for safety.  2/27: walking in clinic with SBA/mod. I for 100 feet with RW and use of prosthetic leg.  6/11: amb. With RW or rollator <100 feet with SBA for RW/ CGA for rollator.  Goal status: Not met  4.  Pt. Will ascend 10 stairs with use of single forearm crutch/ handrail and step to pattern with mod. I safely.  Baseline:  see above Goal status: Not met  PLAN: PT FREQUENCY: 2x/week  PT DURATION: 8 weeks  PLANNED INTERVENTIONS: Therapeutic exercises, Therapeutic activity, Neuromuscular re-education, Balance training, Gait training, Patient/Family education, Self Care, Joint mobilization, Prosthetic training, DME instructions, and Manual therapy  PLAN FOR NEXT  SESSION:  Progress gait/ standing tolerance.  F/u on upright rollator.  FOCUS ON STAIRS to prepare for beach trip in 2 months (14 stairs to enter house).      Cammie Mcgee, PT, DPT # [phone] Physical Therapist - [place]  [facility]  3:45 PM,08/15/22

## 2022-08-18 ENCOUNTER — Encounter: Payer: Self-pay | Admitting: Physical Therapy

## 2022-08-18 ENCOUNTER — Ambulatory Visit: Payer: Medicare Other

## 2022-08-18 DIAGNOSIS — R269 Unspecified abnormalities of gait and mobility: Secondary | ICD-10-CM | POA: Diagnosis not present

## 2022-08-18 DIAGNOSIS — Z89611 Acquired absence of right leg above knee: Secondary | ICD-10-CM

## 2022-08-18 DIAGNOSIS — M6281 Muscle weakness (generalized): Secondary | ICD-10-CM

## 2022-08-18 DIAGNOSIS — R293 Abnormal posture: Secondary | ICD-10-CM

## 2022-08-18 NOTE — Therapy (Signed)
OUTPATIENT PHYSICAL THERAPY LOWER EXTREMITY TREATMENT  Physical Therapy Progress Note   Dates of reporting period  07/05/22   to   08/18/22   Patient Name: Jasmine Morrow MRN: [id number] DOB:Jul 25, 1945, 77 y.o., female Today's Date: 08/18/2022    PT End of Session - 08/18/22 1031     Visit Number 70    Number of Visits 78    Date for PT Re-Evaluation 09/06/22    PT Start Time 1029    PT Stop Time 1117    PT Time Calculation (min) 48 min    Activity Tolerance Patient tolerated treatment well    Behavior During Therapy [facility] for tasks assessed/performed              Past Medical History:  Diagnosis Date   Anemia    Anxiety    Asthma    seasonal    GERD (gastroesophageal reflux disease)    H/O above knee amputation, 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   Hypertension    PONV (postoperative nausea and vomiting)    Seasonal allergies    Sleep apnea    does not uses CPAP machine anymore    Wheelchair dependent    able to self transfer   Past Surgical History:  Procedure Laterality Date   APPENDECTOMY     CATARACT EXTRACTION W/PHACO Right 02/28/2022   Procedure: CATARACT EXTRACTION PHACO AND INTRAOCULAR LENS PLACEMENT (IOC) RIGHT  4.82  00:30.9;  Surgeon: Nevada Crane, MD;  Location: [facility];  Service: Ophthalmology;  Laterality: Right;  sleep apnea   CATARACT EXTRACTION W/PHACO Left 03/14/2022   Procedure: CATARACT EXTRACTION PHACO AND INTRAOCULAR LENS PLACEMENT (IOC) LEFT;  Surgeon: Nevada Crane, MD;  Location: [facility];  Service: Ophthalmology;  Laterality: Left;  4.32 0:39.7   CESAREAN SECTION     CHOLECYSTECTOMY     DILATION AND CURETTAGE OF UTERUS     ESOPHAGOGASTRODUODENOSCOPY N/A 02/22/2019   Procedure: ESOPHAGOGASTRODUODENOSCOPY (EGD);  Surgeon: Toledo, Boykin Nearing, MD;  Location: [facility];  Service: Gastroenterology;  Laterality: N/A;   FLEXIBLE SIGMOIDOSCOPY N/A 02/22/2019   Procedure: FLEXIBLE SIGMOIDOSCOPY;  Surgeon: Toledo, Boykin Nearing, MD;  Location: [facility];  Service: Gastroenterology;  Laterality: N/A;   HARDWARE REMOVAL Right 11/12/2019   Procedure: Right ankle hardware removal;  Surgeon: Kennedy Bucker, MD;  Location: [facility];  Service: Orthopedics;  Laterality: Right;   HARDWARE REMOVAL Right 04/17/2020   Procedure: HARDWARE REMOVAL FROM RIGHT ANKLE AND RIGHT KNEE; IMPLANT OF CEMENT SPACERS IN RIGHT KNEE;  Surgeon: Donato Heinz, MD;  Location: [facility];  Service: Orthopedics;  Laterality: Right;   JOINT REPLACEMENT     LEG AMPUTATION ABOVE KNEE Right 12/02/2020   [facility]   ORIF ANKLE FRACTURE Right 03/14/2019   Procedure: OPEN REDUCTION INTERNAL FIXATION (ORIF) ANKLE FRACTURE, MEDIAL MALLEOLUS;  Surgeon: Kennedy Bucker, MD;  Location: [facility];  Service: Orthopedics;  Laterality: Right;   ORIF ANKLE FRACTURE Right 02/08/2019   Procedure: OPEN REDUCTION INTERNAL FIXATION (ORIF) ANKLE FRACTURE, post malleolus;  Surgeon: Kennedy Bucker, MD;  Location: [facility];  Service: Orthopedics;  Laterality: Right;   PARTIAL HYSTERECTOMY  1993   REPLACEMENT TOTAL KNEE Bilateral 2008   2009   SCAR DEBRIDEMENT OF TOTAL KNEE  01/2020   SYNDESMOSIS REPAIR Right 02/08/2019   Procedure: SYNDESMOSIS REPAIR;  Surgeon: Kennedy Bucker, MD;  Location: [facility];  Service: Orthopedics;  Laterality: Right;   TEE WITHOUT CARDIOVERSION N/A 02/26/2019   Procedure: TRANSESOPHAGEAL ECHOCARDIOGRAM (TEE);  Surgeon: Lamar Blinks,  MD;  Location: [facility];  Service: Cardiovascular;  Laterality: N/A;   TOTAL KNEE REVISION WITH SCAR DEBRIDEMENT/PATELLA REVISION WITH POLY EXCHANGE Right 01/24/2020   Procedure: TOTAL KNEE REVISION WITH SCAR DEBRIDEMENT/PATELLA REVISION WITH POLY EXCHANGE;  Surgeon: Donato Heinz, MD;  Location: [facility];  Service: Orthopedics;  Laterality: Right;   TUMOR REMOVAL     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   Patient Active Problem List   Diagnosis Date Noted   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    Anxiety 04/17/2020   History of GI diverticular bleed 04/17/2020   Chest pain 04/17/2020   PAD (peripheral artery disease) (HCC) 04/05/2020   Ulcer of right leg (HCC) 04/05/2020   COPD (chronic obstructive pulmonary disease) (HCC) 04/05/2020   GERD (gastroesophageal reflux disease) 04/05/2020   Abscess of right knee 01/22/2020   Pressure injury of skin 02/28/2019   SOB (shortness of breath)    Gastrointestinal hemorrhage    Sepsis (HCC)    Symptomatic anemia 02/20/2019   Trimalleolar fracture of ankle, closed, right, initial encounter 02/07/2019   Multiple lung nodules 03/31/2014   Extrinsic asthma 03/27/2014   OSA on CPAP 03/27/2014    PCP: Danella Penton, MD  REFERRING PROVIDER: Danella Penton, MD  REFERRING DIAG: Above knee amputation of right lower extremity  THERAPY DIAG:  Gait difficulty  S/P AKA (above knee amputation) unilateral, right (HCC)  Muscle weakness (generalized)  Abnormal posture  Rationale for Evaluation and Treatment Rehabilitation  ONSET DATE: 12/02/20  SUBJECTIVE:  EVALUATION  PERTINENT HISTORY: Patient Profile:   Jasmine Morrow is a 77 y.o. female Chief Complaint  Patient presents with  Visit Follow Up  Doing well.   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 Cardiac cath 10/2011 showed completely normal coronary arteries, however she did have mild left  ventricular dysfunction with anterior wall motion abnormality. Left ventricular EF was 51%.  History of chickenpox  Hyperlipidemia  patient states she has never been diagnosed with this  Hypertension  Knee joint replacement by other means  Macular degeneration  Major depressive disorder, recurrent, mild (CMS-HCC)  Medicare annual wellness visit, initial 08/09/2019  7/21  Multiple thyroid nodules  Normal coronary arteries 09/06/2013  Normal coronary anatomy by cardiac catheterization 10/12/11  Osteoarthritis  Ovarian cyst  PONV (postoperative nausea and vomiting)  nausea  Postoperative urinary retention 12/08/2020  Rash on lips 12/21/2020  Seasonal allergies  Sinusitis, unspecified  Sleep apnea  On CPAP.  Slow transit constipation 12/10/2020  Trimalleolar fracture of ankle, closed, right, initial encounter 02/07/2019  Complicated by staph infection 1/21, 4 weeks IV Ancef, Menz  Ulcer   Past Surgical History:  Procedure Laterality Date  SALPINGO OOPHORECTOMY Bilateral 1993  Right total knee arthroplasty 10/11/2006  Dr Ernest Pine  Left total knee arthroplasty 10/12/2007  Dr Ernest Pine  COLONOSCOPY 07/30/2012  internal hemorrhoids, diverticulosis  ANKLE ARTHODESIS W/ ARTHROSCOPY Right 02/08/2019  03/14/2019 second surgery  COLONOSCOPY 02/22/2019  Blood in entire colon/Diverticulosis -  Presumed diverticular bleed. No repeat recommended per TKT.  EGD 02/22/2019  Gastritis/gastric ulcers/Hiatal hernia/Otherwise normal - no repeat recommended per TKT.  ORIF ANKLE FRACTURE Right 03/14/2019  Dr. Rosita Kea  REMOVAL HARDWARE ANKLE FOOT/TOES Right 11/12/2019  Dr. Rosita Kea  Right knee arthrotomy, irrigation and debridement of the right knee, polyethylene exchange 01/24/2020  Dr Ernest Pine  Removal of hardware (plates and screws) from the right ankle with irrigation, debridement, and placement of Stimulan antibiotic beads 04/17/2020  Dr Ernest Pine  Right knee arthrotomy, extensive irrigation debridement,  removal of right total knee implants, and placement of antibiotic impregnated polymethylmethacrylate cement spacer 04/17/2020  Dr Ernest Pine  AMPUTATION LEG ABOVE KNEE AKA Right 12/02/2020  Procedure: AMPUTATION, THIGH, THROUGH FEMUR, ANY LEVEL; Surgeon: Starr Lake, MD; Location: [facility]; Service: Orthopedics; Laterality: Right;  TRANSFER ADJACENT TISSUE LEG Right 12/02/2020  Procedure: ADJACENT TISSUE TRANSFER OR REARRANGEMENT, LEG; DEFECT 10 SQ CM OR LESS; Surgeon: Starr Lake, MD; Location: [facility]; Service: Orthopedics; Laterality: Right;  ASPIRATION/INJECTION MAJOR JOINT/BURSA KNEE Left 12/02/2020  Procedure: ARTHROCENTESIS, ASPIRATION AND/OR INJECTION, MAJOR JOINT OR BURSA, KNEE; WITHOUT ULTRASOUND GUIDANCE; Surgeon: Starr Lake, MD; Location: [facility]; Service: Orthopedics; Laterality: Left;  REMOVAL KNEE PROSTHESIS W/POSSIBLE PLACEMENT SPACER Left 01/18/2021  Procedure: REMOVAL OF PROSTHESIS, INCLUDING TOTAL KNEE PROSTHESIS, METHYLMETHACRYLATE WITH OR WITHOUT INSERTION OF SPACER, KNEE; Surgeon: Celine Mans, MD; Location: [facility]; Service: Orthopedics; Laterality: Left;  INSERTION NON-BIODEGRADABLE DRUG DELIVERY IMPLANT Left 01/18/2021  Procedure: INSERTION, KNEE, bioresorbable, biodegradable, NON-BIODEGRADABLE DRUG DELIVERY IMPLANT; Surgeon: Celine Mans, MD; Location: [facility]; Service: Orthopedics; Laterality: Left;  ABOVE KNEE LEG AMPUTATION  12/02/2020  APPENDECTOMY Est 1968  CESAREAN SECTION  CHOLECYSTECTOMY  FRACTURE SURGERY 1/21  HYSTERECTOMY partial  JOINT REPLACEMENT 2008 & 2009  TUBAL LIGATION 3/79   ALLERGIES: Allergies  Allergen Reactions  Singulair [Montelukast] Other (See Comments)  Gland swelling  Sulfa (Sulfonamide Antibiotics) Swelling  Oxycodone Dizziness and Other (See Comments)  Vancomycin Analogues Other (See Comments)  Ringing in ears  Avinza [Morphine] Itching  Darvocet A500  [Propoxyphene N-Acetaminophen] Nausea  Nickel Rash  Ultracet [Tramadol-Acetaminophen] Rash  Vicodin [Hydrocodone-Acetaminophen] Vomiting  Vioxx [Rofecoxib] Other (See Comments)  GI   CURRENT MEDICATIONS: Current Outpatient Medications: acetaminophen (TYLENOL) 500 MG tablet, Take 1,000 mg by mouth 3 (three) times a day Twice daily per patient., PRN Not Currently Taking aspirin 81 MG EC tablet, Take 1 tablet (81 mg total) by mouth at bedtime, Taking calcium carbonate-vitamin D3 (CALTRATE 600+D) 600 mg-10 mcg (400 unit) tablet, Take 0.5 tablets by mouth 2 (two) times daily, Taking cetirizine (ZYRTEC) 10 mg capsule, Take 1 capsule (10 mg total) by mouth once daily, Taking escitalopram oxalate (LEXAPRO) 10 MG tablet, Take 1 tablet (10 mg total) by mouth once daily, Taking estradioL (ESTRACE) 0.01 % (0.1 mg/gram) vaginal cream, Place 2 g vaginally twice a week, Taking folic acid/multivit,iron,miner (MULTIVIT-IRON-MIN-FOLIC ACID ORAL), Take 1 tablet by mouth once daily, Taking gabapentin (NEURONTIN) 300 MG capsule, Take 1 capsule (300 mg total) by mouth 3 (three) times daily Phantom limb pain, Taking melatonin 3 mg tablet, Take 1 tablet (3 mg total) by mouth at bedtime, Taking multivitamin with iron (COMPLETE MULTIVITAMIN-MINERAL) tablet, Take 1 tablet by mouth, Taking multivitamin with minerals, EYE, (PRESERVISION AREDS 2) soft gel capsule, Take 1 capsule by mouth 2 (two) times daily, Taking nystatin (MYCOSTATIN) 100,000 unit/gram powder, Apply small amount topically twice daily for rash under breasts, PRN Not Currently Taking pantoprazole (PROTONIX) 40 MG DR tablet, Take 1  tablet (40 mg total) by mouth 2 (two) times daily. Please call to schedule an appt. Thanks!, Taking polyethylene glycol (MIRALAX) powder, Take 17 g by mouth once daily Mix in 4-8ounces of fluid prior to taking., Taking sennosides-docusate (SENOKOT-S) 8.6-50 mg tablet, Take 1 tablet by mouth 2 (two) times daily For constipation,  Taking lactose-reduced food (ENSURE PLUS ORAL), Take 237 mLs by mouth 2 (two) times daily With breakfast and dinner  HPI   CLINICAL SUMMARY:  Patient post right AKA and working with prosthesis. 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 Able to do housework, improving  PAIN:  Are you having pain? Yes: NPRS scale: 5/10 Pain location: L knee Pain description: aching Aggravating factors: walking Relieving factors: rest  PRECAUTIONS: None  WEIGHT BEARING RESTRICTIONS No  FALLS:  Has patient fallen in last 6 months? No  LIVING ENVIRONMENT: Lives with: lives with their spouse Lives in: House/apartment Stairs: No Has following equipment at home: Dan Humphreys - 4 wheeled and Wheelchair (manual)  OCCUPATION: retired  PLOF: Requires assistive device for independence  PATIENT GOALS  improve standing tolerance/ pull up pants/ ambulate with SPC safely.    OBJECTIVE:   PATIENT SURVEYS:  FOTO initial 46/ goal 26.   11/2: 50  COGNITION:  Overall cognitive status: Within functional limits for tasks assessed     SENSATION: WFL  POSTURE: rounded shoulders, forward head, flexed trunk , and weight shift left  PALPATION: No tenderness along R distal residual limb.  Discussed phantom limb sensation.    LOWER EXTREMITY ROM:  B LE AROM WFL except L knee extension secondary to articulating spacer.  Did not assess L/R hip extension at this time.    LOWER EXTREMITY MMT:  MMT Right eval Left eval  Hip flexion 4/5 4/5  Hip extension    Hip abduction 4+/5 4+/5  Hip adduction 4+/5 4+/5  Hip internal rotation    Hip external rotation    Knee flexion N/A 4+/5  Knee extension N/A 4+/5  Ankle dorsiflexion  5/5  Ankle plantarflexion    Ankle inversion    Ankle eversion     (Blank rows = not tested)  GAIT: Distance walked: in gym/ //-bars Assistive device utilized: Environmental consultant - 2 wheeled Level of assistance: CGA Comments: Cuing for posture correction with  mirror feedback.  Flexed posture/ heavy UE assist.    TUG: UE assist required to stand/ use of RW.  51.9 seconds.     TODAY'S TREATMENT:  07/12/2022  Subjective: Pt. Arrived to PT with husband and donned prosthetic leg on blue mat table.  Pts. Fit of prosthetic leg was limited today and strap had to be adjusted multiple times before gait. Improved fit of prosthetic compared to previous session. Patient reports doing better but a little short of breath with increase activity.   Objective:   There.ex.:  No charge   Nustep L5 10+ min. B UE/LE (consistent cadence/ no residual limb pain or tenderness reported).                Gait training:    Amb. From blue mat table to Nustep with use of rollator and focus on step through gait.  SBA/CGA on R to ensure R knee mechanism locks out before wt. Bearing.  No verbal cuing required and consistent gait pattern/ step pattern.  PT adjusted/tightened fit several times.    Ambulate with use of rollator from Nustep to stairs with focus on step through gait. Intermittent cueing for upright  posture. SBA/CGA on R to ensure  R knee mechanism locks out before weightbearing. Moderate fatigue in B shoulders.    Stair training with B handrails forward on ascent and descent. Attempted training on ascent sideways with single rail but with increased difficulty. Only able to complete 2 stairs prior to stopping activity.    In // bars, ambulated fwd with use of rollator and bwd with use of // bars for stability. Use of mirror for visual feedback. Cues for upright posture. CGA for safety to ensure R knee mechanism locks out before weight bearing. (Not today)    Amb. With RW from //-bars around gym 1x and pt. Required seated rest break in w/c at gym door prior to returning to car.  Pt. Limited by increase B shoulder muscle fatigue/ discomfort.  No episodes of R knee mechanism buckling.  (Not today)  PATIENT EDUCATION:  Education details: HEP/ gait and prosthetic training.   Person educated: Patient and Spouse Education method: Explanation, Demonstration, and Verbal cues Education comprehension: verbalized understanding, returned demonstration, and tactile cues required   HOME EXERCISE PROGRAM: Prone position hip stretches/ Standing marching at kitchen counter with w/c behind patient.      ASSESSMENT:  CLINICAL IMPRESSION:    Patient presents to PT in w/c with prosthetic leg in hand. Highly motivated to become more independent with use of RW as well as introducing use of rollator for mobility. Continues to ambulate with good BOS/step pattern with no episodes of R knee mechanism buckling this session. Session focused on stair training with B handrails forwards and single handrail sideways in preparation for trip in 2 months. Patient continues to demonstrate progress towards physical therapy goals, see below. Patient will continue to benefit from skilled PT services to improve standing tolerance/ gait independence and ADL.  OBJECTIVE IMPAIRMENTS Abnormal gait, decreased activity tolerance, decreased balance, decreased endurance, decreased mobility, difficulty walking, decreased ROM, decreased strength, decreased safety awareness, impaired flexibility, improper body mechanics, prosthetic dependency , and pain.   ACTIVITY LIMITATIONS carrying, lifting, standing, squatting, stairs, transfers, bed mobility, bathing, toileting, dressing, hygiene/grooming, and locomotion level  PARTICIPATION LIMITATIONS: cleaning, laundry, driving, shopping, community activity, and yard work  PERSONAL FACTORS Past/current experiences are also affecting patient's functional outcome.   REHAB POTENTIAL: Good  CLINICAL DECISION MAKING: Evolving/moderate complexity  EVALUATION COMPLEXITY: High   GOALS: Goals reviewed with patient Yes  LONG TERM GOALS: Target date: 09/06/22  Pt. Will increase FOTO to 54 to improve functional mobility.  Baseline: initial FOTO 46.  11/2: 50.  4/25: 60   Goal status: Goal met  2.  Pt. Able to tolerate standing 10 minutes while pulling up pants with mod. I safely while wearing prosthetic leg to improve daily activities.  Baseline:  limited standing tolerance Goal status: Partially met  3.  Pt. Will ambulate 200 feet with use of least assistive device and mod. I to promote safety with household tasks/ walking into bathroom.   Baseline:  amb. With RW and min. A for safety.  2/27: walking in clinic with SBA/mod. I for 100 feet with RW and use of prosthetic leg.  6/11: amb. With RW or rollator <100 feet with SBA for RW/ CGA for rollator. 7/18: 120' with SBA with RW/CGA for rollator Goal status: Ongoing  4.  Pt. Will ascend 10 stairs with use of single forearm crutch/ handrail and step to pattern with mod. I safely.  Baseline:  see above; 7/18: ascends 4 stairs with step to pattern, with B rails, and  CGA Goal status: Ongoing  PLAN: PT FREQUENCY: 2x/week  PT DURATION: 8 weeks  PLANNED INTERVENTIONS: Therapeutic exercises, Therapeutic activity, Neuromuscular re-education, Balance training, Gait training, Patient/Family education, Self Care, Joint mobilization, Prosthetic training, DME instructions, and Manual therapy  PLAN FOR NEXT SESSION:  Progress gait/ standing tolerance.  F/u on upright rollator.  FOCUS ON STAIRS to prepare for beach trip in 2 months (14 stairs to enter house).      Maylon Peppers, PT, DPT Physical Therapist - [place]  [facility]  12:20 PM,08/18/22

## 2022-08-22 ENCOUNTER — Encounter: Payer: Self-pay | Admitting: Physical Therapy

## 2022-08-22 ENCOUNTER — Ambulatory Visit: Payer: Medicare Other | Admitting: Physical Therapy

## 2022-08-22 DIAGNOSIS — Z89611 Acquired absence of right leg above knee: Secondary | ICD-10-CM

## 2022-08-22 DIAGNOSIS — M6281 Muscle weakness (generalized): Secondary | ICD-10-CM

## 2022-08-22 DIAGNOSIS — R269 Unspecified abnormalities of gait and mobility: Secondary | ICD-10-CM

## 2022-08-22 DIAGNOSIS — R293 Abnormal posture: Secondary | ICD-10-CM

## 2022-08-22 NOTE — Therapy (Signed)
OUTPATIENT PHYSICAL THERAPY LOWER EXTREMITY TREATMENT   Patient Name: Jasmine Morrow MRN: [id number] DOB:[birth year], 77 y.o., female Today's Date: 08/22/2022    PT End of Session - 08/22/22 1026     Visit Number 71    Number of Visits 78    Date for PT Re-Evaluation 09/06/22    PT Start Time 1026    PT Stop Time 1117    PT Time Calculation (min) 51 min    Activity Tolerance Patient tolerated treatment well    Behavior During Therapy WFL for tasks assessed/performed              Past Medical History:  Diagnosis Date   Anemia    Anxiety    Asthma    seasonal    GERD (gastroesophageal reflux disease)    H/O above knee amputation, 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   Hypertension    PONV (postoperative nausea and vomiting)    Seasonal allergies    Sleep apnea    does not uses CPAP machine anymore    Wheelchair dependent    able to self transfer   Past Surgical History:  Procedure Laterality Date   APPENDECTOMY     CATARACT EXTRACTION W/PHACO Right 02/28/2022   Procedure: CATARACT EXTRACTION PHACO AND INTRAOCULAR LENS PLACEMENT (IOC) RIGHT  4.82  00:30.9;  Surgeon: Nevada Crane, MD;  Location: [facility];  Service: Ophthalmology;  Laterality: Right;  sleep apnea   CATARACT EXTRACTION W/PHACO Left 03/14/2022   Procedure: CATARACT EXTRACTION PHACO AND INTRAOCULAR LENS PLACEMENT (IOC) LEFT;  Surgeon: Nevada Crane, MD;  Location: [facility];  Service: Ophthalmology;  Laterality: Left;  4.32 0:39.7   CESAREAN SECTION     CHOLECYSTECTOMY     DILATION AND CURETTAGE OF UTERUS     ESOPHAGOGASTRODUODENOSCOPY N/A 02/22/2019   Procedure: ESOPHAGOGASTRODUODENOSCOPY (EGD);  Surgeon: Toledo, Boykin Nearing, MD;  Location: [facility];  Service: Gastroenterology;  Laterality: N/A;   FLEXIBLE SIGMOIDOSCOPY N/A 02/22/2019   Procedure: FLEXIBLE SIGMOIDOSCOPY;  Surgeon: Toledo, Boykin Nearing, MD;  Location: [facility];  Service: Gastroenterology;  Laterality: N/A;    HARDWARE REMOVAL Right 11/12/2019   Procedure: Right ankle hardware removal;  Surgeon: Kennedy Bucker, MD;  Location: [facility];  Service: Orthopedics;  Laterality: Right;   HARDWARE REMOVAL Right 04/17/2020   Procedure: HARDWARE REMOVAL FROM RIGHT ANKLE AND RIGHT KNEE; IMPLANT OF CEMENT SPACERS IN RIGHT KNEE;  Surgeon: Donato Heinz, MD;  Location: [facility];  Service: Orthopedics;  Laterality: Right;   JOINT REPLACEMENT     LEG AMPUTATION ABOVE KNEE Right 12/02/2020   [facility]   ORIF ANKLE FRACTURE Right 03/14/2019   Procedure: OPEN REDUCTION INTERNAL FIXATION (ORIF) ANKLE FRACTURE, MEDIAL MALLEOLUS;  Surgeon: Kennedy Bucker, MD;  Location: [facility];  Service: Orthopedics;  Laterality: Right;   ORIF ANKLE FRACTURE Right 02/08/2019   Procedure: OPEN REDUCTION INTERNAL FIXATION (ORIF) ANKLE FRACTURE, post malleolus;  Surgeon: Kennedy Bucker, MD;  Location: [facility];  Service: Orthopedics;  Laterality: Right;   PARTIAL HYSTERECTOMY  1993   REPLACEMENT TOTAL KNEE Bilateral 2008   2009   SCAR DEBRIDEMENT OF TOTAL KNEE  01/2020   SYNDESMOSIS REPAIR Right 02/08/2019   Procedure: SYNDESMOSIS REPAIR;  Surgeon: Kennedy Bucker, MD;  Location: [facility];  Service: Orthopedics;  Laterality: Right;   TEE WITHOUT CARDIOVERSION N/A 02/26/2019   Procedure: TRANSESOPHAGEAL ECHOCARDIOGRAM (TEE);  Surgeon: Lamar Blinks, MD;  Location: [facility];  Service: Cardiovascular;  Laterality: N/A;   TOTAL KNEE REVISION WITH SCAR DEBRIDEMENT/PATELLA  REVISION WITH POLY EXCHANGE Right 01/24/2020   Procedure: TOTAL KNEE REVISION WITH SCAR DEBRIDEMENT/PATELLA REVISION WITH POLY EXCHANGE;  Surgeon: Donato Heinz, MD;  Location: [facility];  Service: Orthopedics;  Laterality: Right;   TUMOR REMOVAL     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   Patient Active Problem List   Diagnosis Date Noted   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   Anxiety 04/17/2020   History of GI diverticular bleed 04/17/2020   Chest pain  04/17/2020   PAD (peripheral artery disease) (HCC) 04/05/2020   Ulcer of right leg (HCC) 04/05/2020   COPD (chronic obstructive pulmonary disease) (HCC) 04/05/2020   GERD (gastroesophageal reflux disease) 04/05/2020   Abscess of right knee 01/22/2020   Pressure injury of skin 02/28/2019   SOB (shortness of breath)    Gastrointestinal hemorrhage    Sepsis (HCC)    Symptomatic anemia 02/20/2019   Trimalleolar fracture of ankle, closed, right, initial encounter 02/07/2019   Multiple lung nodules 03/31/2014   Extrinsic asthma 03/27/2014   OSA on CPAP 03/27/2014    PCP: Danella Penton, MD  REFERRING PROVIDER: Danella Penton, MD  REFERRING DIAG: Above knee amputation of right lower extremity  THERAPY DIAG:  Gait difficulty  S/P AKA (above knee amputation) unilateral, right (HCC)  Muscle weakness (generalized)  Abnormal posture  Rationale for Evaluation and Treatment Rehabilitation  ONSET DATE: 12/02/20  SUBJECTIVE:  EVALUATION  PERTINENT HISTORY: Patient Profile:   Jasmine Morrow is a 77 y.o. female Chief Complaint  Patient presents with  Visit Follow Up  Doing well.   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 Cardiac cath 10/2011 showed completely normal coronary arteries, however she did have mild left ventricular dysfunction with anterior wall motion abnormality. Left ventricular  EF was 51%.  History of chickenpox  Hyperlipidemia  patient states she has never been diagnosed with this  Hypertension  Knee joint replacement by other means  Macular degeneration  Major depressive disorder, recurrent, mild (CMS-HCC)  Medicare annual wellness visit, initial 08/09/2019  7/21  Multiple thyroid nodules  Normal coronary arteries 09/06/2013  Normal coronary anatomy by cardiac catheterization 10/12/11  Osteoarthritis  Ovarian cyst  PONV (postoperative nausea and vomiting)  nausea  Postoperative urinary retention 12/08/2020  Rash on lips 12/21/2020  Seasonal allergies  Sinusitis, unspecified  Sleep apnea  On CPAP.  Slow transit constipation 12/10/2020  Trimalleolar fracture of ankle, closed, right, initial encounter 02/07/2019  Complicated by staph infection 1/21, 4 weeks IV Ancef, Menz  Ulcer   Past Surgical History:  Procedure Laterality Date  SALPINGO OOPHORECTOMY Bilateral 1993  Right total knee arthroplasty 10/11/2006  Dr Ernest Pine  Left total knee arthroplasty 10/12/2007  Dr Ernest Pine  COLONOSCOPY 07/30/2012  internal hemorrhoids, diverticulosis  ANKLE ARTHODESIS W/ ARTHROSCOPY Right 02/08/2019  03/14/2019 second surgery  COLONOSCOPY 02/22/2019  Blood in entire colon/Diverticulosis - Presumed diverticular bleed. No repeat recommended per TKT.  EGD 02/22/2019  Gastritis/gastric ulcers/Hiatal hernia/Otherwise normal - no  repeat recommended per TKT.  ORIF ANKLE FRACTURE Right 03/14/2019  Dr. Rosita Kea  REMOVAL HARDWARE ANKLE FOOT/TOES Right 11/12/2019  Dr. Rosita Kea  Right knee arthrotomy, irrigation and debridement of the right knee, polyethylene exchange 01/24/2020  Dr Ernest Pine  Removal of hardware (plates and screws) from the right ankle with irrigation, debridement, and placement of Stimulan antibiotic beads 04/17/2020  Dr Ernest Pine  Right knee arthrotomy, extensive irrigation debridement, removal of right total knee implants, and placement of antibiotic impregnated  polymethylmethacrylate cement spacer 04/17/2020  Dr Ernest Pine  AMPUTATION LEG ABOVE KNEE AKA Right 12/02/2020  Procedure: AMPUTATION, THIGH, THROUGH FEMUR, ANY LEVEL; Surgeon: Jasmine Lake, MD; Location: [facility]; Service: Orthopedics; Laterality: Right;  TRANSFER ADJACENT TISSUE LEG Right 12/02/2020  Procedure: ADJACENT TISSUE TRANSFER OR REARRANGEMENT, LEG; DEFECT 10 SQ CM OR LESS; Surgeon: Jasmine Lake, MD; Location: [facility]; Service: Orthopedics; Laterality: Right;  ASPIRATION/INJECTION MAJOR JOINT/BURSA KNEE Left 12/02/2020  Procedure: ARTHROCENTESIS, ASPIRATION AND/OR INJECTION, MAJOR JOINT OR BURSA, KNEE; WITHOUT ULTRASOUND GUIDANCE; Surgeon: Jasmine Lake, MD; Location: [facility]; Service: Orthopedics; Laterality: Left;  REMOVAL KNEE PROSTHESIS W/POSSIBLE PLACEMENT SPACER Left 01/18/2021  Procedure: REMOVAL OF PROSTHESIS, INCLUDING TOTAL KNEE PROSTHESIS, METHYLMETHACRYLATE WITH OR WITHOUT INSERTION OF SPACER, KNEE; Surgeon: Celine Mans, MD; Location: [facility]; Service: Orthopedics; Laterality: Left;  INSERTION NON-BIODEGRADABLE DRUG DELIVERY IMPLANT Left 01/18/2021  Procedure: INSERTION, KNEE, bioresorbable, biodegradable, NON-BIODEGRADABLE DRUG DELIVERY IMPLANT; Surgeon: Celine Mans, MD; Location: [facility]; Service: Orthopedics; Laterality: Left;  ABOVE KNEE LEG AMPUTATION  12/02/2020  APPENDECTOMY Est 1968  CESAREAN SECTION  CHOLECYSTECTOMY  FRACTURE SURGERY 1/21  HYSTERECTOMY partial  JOINT REPLACEMENT 2008 & 2009  TUBAL LIGATION 3/79   ALLERGIES: Allergies  Allergen Reactions  Singulair [Montelukast] Other (See Comments)  Gland swelling  Sulfa (Sulfonamide Antibiotics) Swelling  Oxycodone Dizziness and Other (See Comments)  Vancomycin Analogues Other (See Comments)  Ringing in ears  Avinza [Morphine] Itching  Darvocet A500 [Propoxyphene N-Acetaminophen] Nausea  Nickel Rash  Ultracet  [Tramadol-Acetaminophen] Rash  Vicodin [Hydrocodone-Acetaminophen] Vomiting  Vioxx [Rofecoxib] Other (See Comments)  GI   CURRENT MEDICATIONS: Current Outpatient Medications: acetaminophen (TYLENOL) 500 MG tablet, Take 1,000 mg by mouth 3 (three) times a day Twice daily per patient., PRN Not Currently Taking aspirin 81 MG EC tablet, Take 1 tablet (81 mg total) by mouth at bedtime, Taking calcium carbonate-vitamin D3 (CALTRATE 600+D) 600 mg-10 mcg (400 unit) tablet, Take 0.5 tablets by mouth 2 (two) times daily, Taking cetirizine (ZYRTEC) 10 mg capsule, Take 1 capsule (10 mg total) by mouth once daily, Taking escitalopram oxalate (LEXAPRO) 10 MG tablet, Take 1 tablet (10 mg total) by mouth once daily, Taking estradioL (ESTRACE) 0.01 % (0.1 mg/gram) vaginal cream, Place 2 g vaginally twice a week, Taking folic acid/multivit,iron,miner (MULTIVIT-IRON-MIN-FOLIC ACID ORAL), Take 1 tablet by mouth once daily, Taking gabapentin (NEURONTIN) 300 MG capsule, Take 1 capsule (300 mg total) by mouth 3 (three) times daily Phantom limb pain, Taking melatonin 3 mg tablet, Take 1 tablet (3 mg total) by mouth at bedtime, Taking multivitamin with iron (COMPLETE MULTIVITAMIN-MINERAL) tablet, Take 1 tablet by mouth, Taking multivitamin with minerals, EYE, (PRESERVISION AREDS 2) soft gel capsule, Take 1 capsule by mouth 2 (two) times daily, Taking nystatin (MYCOSTATIN) 100,000 unit/gram powder, Apply small amount topically twice daily for rash under breasts, PRN Not Currently Taking pantoprazole (PROTONIX) 40 MG DR tablet, Take 1 tablet (40 mg total) by mouth 2 (two) times daily. Please call to schedule an appt. Thanks!, Taking  polyethylene glycol (MIRALAX) powder, Take 17 g by mouth once daily Mix in 4-8ounces of fluid prior to taking., Taking sennosides-docusate (SENOKOT-S) 8.6-50 mg tablet, Take 1 tablet by mouth 2 (two) times daily For constipation, Taking lactose-reduced food (ENSURE PLUS ORAL), Take 237 mLs by  mouth 2 (two) times daily With breakfast and dinner  HPI   CLINICAL SUMMARY:  Patient post right AKA and working with prosthesis. 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 Able to do housework, improving  PAIN:  Are you having pain? Yes: NPRS scale: 5/10 Pain location: L knee Pain description: aching Aggravating factors: walking Relieving factors: rest  PRECAUTIONS: None  WEIGHT BEARING RESTRICTIONS No  FALLS:  Has patient fallen in last 6 months? No  LIVING ENVIRONMENT: Lives with: lives with their spouse Lives in: House/apartment Stairs: No Has following equipment at home: Dan Humphreys - 4 wheeled and Wheelchair (manual)  OCCUPATION: retired  PLOF: Requires assistive device for independence  PATIENT GOALS  improve standing tolerance/ pull up pants/ ambulate with SPC safely.    OBJECTIVE:   PATIENT SURVEYS:  FOTO initial 46/ goal 88.   11/2: 50  COGNITION:  Overall cognitive status: Within functional limits for tasks assessed     SENSATION: WFL  POSTURE: rounded shoulders, forward head, flexed trunk , and weight shift left  PALPATION: No tenderness along R distal residual limb.  Discussed phantom limb sensation.    LOWER EXTREMITY ROM:  B LE AROM WFL except L knee extension secondary to articulating spacer.  Did not assess L/R hip extension at this time.    LOWER EXTREMITY MMT:  MMT Right eval Left eval  Hip flexion 4/5 4/5  Hip extension    Hip abduction 4+/5 4+/5  Hip adduction 4+/5 4+/5  Hip internal rotation    Hip external rotation    Knee flexion N/A 4+/5  Knee extension N/A 4+/5  Ankle dorsiflexion  5/5  Ankle plantarflexion    Ankle inversion    Ankle eversion     (Blank rows = not tested)  GAIT: Distance walked: in gym/ //-bars Assistive device utilized: Environmental consultant - 2 wheeled Level of assistance: CGA Comments: Cuing for posture correction with mirror feedback.  Flexed posture/ heavy UE assist.    TUG: UE assist  required to stand/ use of RW.  51.9 seconds.     TODAY'S TREATMENT:  08/22/2022  Subjective: Pt. Arrived to PT with husband and donned prosthetic leg on blue mat table.  Pts. Fit of prosthetic leg was limited today and strap had to be adjusted multiple times before gait.  Pt. Discussed upright rollator and states she is unable to use/ transport upright rollator in car.  Pt. Is looking for a different variety of rollator that will fold up for transport.    Objective:   There.ex.:  No charge   Nustep L5 11.5 min. (0.5 miles) B UE/LE (consistent cadence/ no residual limb pain or tenderness reported).                Gait training:    Amb. From blue mat table to Nustep with use of rollator and focus on step through gait.  SBA/CGA on R to ensure R knee mechanism locks out before wt. Bearing.  No verbal cuing required and consistent gait pattern/ step pattern.  PT adjusted/tightened fit several times.    Ambulate with use of rollator from Nustep to stairs with focus on step through gait. Intermittent cueing for upright posture. SBA/CGA on  R to ensure  R knee mechanism locks out before weightbearing. Moderate fatigue in B shoulders.    Stair training with B handrails forward on ascent and descent.  Step to gait pattern with heavy UE assist (4 steps x 4)- seated rest break after 1st series of 8 stairs.  PT reviewed ascending stairs with side stepping pattern/ 1 handrail (pt. Fearful/ loss of BOS).     Amb. With RW from stairs around gym 1x and pt. Required seated rest break in w/c at gym door prior to returning to car.  Pt. Limited by increase B shoulder muscle fatigue/ discomfort.  No episodes of R knee mechanism buckling.    NOT TODAY:  In // bars, ambulated fwd with use of rollator and bwd with use of // bars for stability. Use of mirror for visual feedback. Cues for upright posture. CGA for safety to ensure R knee mechanism locks out before weight bearing.      PATIENT EDUCATION:  Education  details: HEP/ gait and prosthetic training.  Person educated: Patient and Spouse Education method: Explanation, Demonstration, and Verbal cues Education comprehension: verbalized understanding, returned demonstration, and tactile cues required   HOME EXERCISE PROGRAM: Prone position hip stretches/ Standing marching at kitchen counter with w/c behind patient.      ASSESSMENT:  CLINICAL IMPRESSION:    Patient presents to PT in w/c with prosthetic leg in hand. Highly motivated to become more independent with use of RW as well as introducing use of rollator for mobility. Continues to ambulate with good BOS/step pattern with no episodes of R knee mechanism buckling this session. Session focused on stair training with B handrails forwards and single handrail sideways in preparation for trip in 2 months. Patient continues to demonstrate progress towards physical therapy goals, see below. Patient will continue to benefit from skilled PT services to improve standing tolerance/ gait independence and ADL.  OBJECTIVE IMPAIRMENTS Abnormal gait, decreased activity tolerance, decreased balance, decreased endurance, decreased mobility, difficulty walking, decreased ROM, decreased strength, decreased safety awareness, impaired flexibility, improper body mechanics, prosthetic dependency , and pain.   ACTIVITY LIMITATIONS carrying, lifting, standing, squatting, stairs, transfers, bed mobility, bathing, toileting, dressing, hygiene/grooming, and locomotion level  PARTICIPATION LIMITATIONS: cleaning, laundry, driving, shopping, community activity, and yard work  PERSONAL FACTORS Past/current experiences are also affecting patient's functional outcome.   REHAB POTENTIAL: Good  CLINICAL DECISION MAKING: Evolving/moderate complexity  EVALUATION COMPLEXITY: High   GOALS: Goals reviewed with patient Yes  LONG TERM GOALS: Target date: 09/06/22  Pt. Will increase FOTO to 54 to improve functional mobility.   Baseline: initial FOTO 46.  11/2: 50.  4/25: 60  Goal status: Goal met  2.  Pt. Able to tolerate standing 10 minutes while pulling up pants with mod. I safely while wearing prosthetic leg to improve daily activities.  Baseline:  limited standing tolerance Goal status: Partially met  3.  Pt. Will ambulate 200 feet with use of least assistive device and mod. I to promote safety with household tasks/ walking into bathroom.   Baseline:  amb. With RW and min. A for safety.  2/27: walking in clinic with SBA/mod. I for 100 feet with RW and use of prosthetic leg.  6/11: amb. With RW or rollator <100 feet with SBA for RW/ CGA for rollator. 7/18: 120' with SBA with RW/CGA for rollator Goal status: Ongoing  4.  Pt. Will ascend 10 stairs with use of single forearm crutch/ handrail and step to pattern with mod. I safely.  Baseline:  see above; 7/18: ascends 4 stairs with step to pattern, with B rails, and CGA Goal status: Ongoing  PLAN: PT FREQUENCY: 2x/week  PT DURATION: 8 weeks  PLANNED INTERVENTIONS: Therapeutic exercises, Therapeutic activity, Neuromuscular re-education, Balance training, Gait training, Patient/Family education, Self Care, Joint mobilization, Prosthetic training, DME instructions, and Manual therapy  PLAN FOR NEXT SESSION:  Progress gait/ standing tolerance.  F/u on upright rollator.  FOCUS ON STAIRS to prepare for beach trip in 2 months (14 stairs to enter house).      Cammie Mcgee, PT, DPT # [phone] Physical Therapist - [place]  [facility]  1:54 PM,08/22/22

## 2022-08-24 ENCOUNTER — Ambulatory Visit: Payer: Medicare Other | Admitting: Physical Therapy

## 2022-08-25 ENCOUNTER — Encounter: Payer: Self-pay | Admitting: Physical Therapy

## 2022-08-25 ENCOUNTER — Ambulatory Visit: Payer: Medicare Other

## 2022-08-25 DIAGNOSIS — Z89611 Acquired absence of right leg above knee: Secondary | ICD-10-CM

## 2022-08-25 DIAGNOSIS — R269 Unspecified abnormalities of gait and mobility: Secondary | ICD-10-CM | POA: Diagnosis not present

## 2022-08-25 DIAGNOSIS — R293 Abnormal posture: Secondary | ICD-10-CM

## 2022-08-25 DIAGNOSIS — M6281 Muscle weakness (generalized): Secondary | ICD-10-CM

## 2022-08-25 NOTE — Therapy (Signed)
OUTPATIENT PHYSICAL THERAPY LOWER EXTREMITY TREATMENT   Patient Name: Jasmine Morrow MRN: [id number] DOB:Sep 18, 1945, 77 y.o., female Today's Date: 08/25/2022    PT End of Session - 08/25/22 1406     Visit Number 72    Number of Visits 78    Date for PT Re-Evaluation 09/06/22    PT Start Time 1410    PT Stop Time 1459    PT Time Calculation (min) 49 min    Activity Tolerance Patient tolerated treatment well    Behavior During Therapy WFL for tasks assessed/performed               Past Medical History:  Diagnosis Date   Anemia    Anxiety    Asthma    seasonal    GERD (gastroesophageal reflux disease)    H/O above knee amputation, 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   Hypertension    PONV (postoperative nausea and vomiting)    Seasonal allergies    Sleep apnea    does not uses CPAP machine anymore    Wheelchair dependent    able to self transfer   Past Surgical History:  Procedure Laterality Date   APPENDECTOMY     CATARACT EXTRACTION W/PHACO Right 02/28/2022   Procedure: CATARACT EXTRACTION PHACO AND INTRAOCULAR LENS PLACEMENT (IOC) RIGHT  4.82  00:30.9;  Surgeon: Nevada Crane, MD;  Location: [facility];  Service: Ophthalmology;  Laterality: Right;  sleep apnea   CATARACT EXTRACTION W/PHACO Left 03/14/2022   Procedure: CATARACT EXTRACTION PHACO AND INTRAOCULAR LENS PLACEMENT (IOC) LEFT;  Surgeon: Nevada Crane, MD;  Location: [facility];  Service: Ophthalmology;  Laterality: Left;  4.32 0:39.7   CESAREAN SECTION     CHOLECYSTECTOMY     DILATION AND CURETTAGE OF UTERUS     ESOPHAGOGASTRODUODENOSCOPY N/A 02/22/2019   Procedure: ESOPHAGOGASTRODUODENOSCOPY (EGD);  Surgeon: Toledo, Boykin Nearing, MD;  Location: [facility];  Service: Gastroenterology;  Laterality: N/A;   FLEXIBLE SIGMOIDOSCOPY N/A 02/22/2019   Procedure: FLEXIBLE SIGMOIDOSCOPY;  Surgeon: Toledo, Boykin Nearing, MD;  Location: [facility];  Service: Gastroenterology;  Laterality: N/A;    HARDWARE REMOVAL Right 11/12/2019   Procedure: Right ankle hardware removal;  Surgeon: Kennedy Bucker, MD;  Location: [facility];  Service: Orthopedics;  Laterality: Right;   HARDWARE REMOVAL Right 04/17/2020   Procedure: HARDWARE REMOVAL FROM RIGHT ANKLE AND RIGHT KNEE; IMPLANT OF CEMENT SPACERS IN RIGHT KNEE;  Surgeon: Donato Heinz, MD;  Location: [facility];  Service: Orthopedics;  Laterality: Right;   JOINT REPLACEMENT     LEG AMPUTATION ABOVE KNEE Right 12/02/2020   [facility]   ORIF ANKLE FRACTURE Right 03/14/2019   Procedure: OPEN REDUCTION INTERNAL FIXATION (ORIF) ANKLE FRACTURE, MEDIAL MALLEOLUS;  Surgeon: Kennedy Bucker, MD;  Location: [facility];  Service: Orthopedics;  Laterality: Right;   ORIF ANKLE FRACTURE Right 02/08/2019   Procedure: OPEN REDUCTION INTERNAL FIXATION (ORIF) ANKLE FRACTURE, post malleolus;  Surgeon: Kennedy Bucker, MD;  Location: [facility];  Service: Orthopedics;  Laterality: Right;   PARTIAL HYSTERECTOMY  1993   REPLACEMENT TOTAL KNEE Bilateral 2008   2009   SCAR DEBRIDEMENT OF TOTAL KNEE  01/2020   SYNDESMOSIS REPAIR Right 02/08/2019   Procedure: SYNDESMOSIS REPAIR;  Surgeon: Kennedy Bucker, MD;  Location: [facility];  Service: Orthopedics;  Laterality: Right;   TEE WITHOUT CARDIOVERSION N/A 02/26/2019   Procedure: TRANSESOPHAGEAL ECHOCARDIOGRAM (TEE);  Surgeon: Lamar Blinks, MD;  Location: [facility];  Service: Cardiovascular;  Laterality: N/A;   TOTAL KNEE REVISION WITH SCAR  DEBRIDEMENT/PATELLA REVISION WITH POLY EXCHANGE Right 01/24/2020   Procedure: TOTAL KNEE REVISION WITH SCAR DEBRIDEMENT/PATELLA REVISION WITH POLY EXCHANGE;  Surgeon: Donato Heinz, MD;  Location: [facility];  Service: Orthopedics;  Laterality: Right;   TUMOR REMOVAL     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   Patient Active Problem List   Diagnosis Date Noted   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   Anxiety 04/17/2020   History of GI diverticular bleed 04/17/2020   Chest pain  04/17/2020   PAD (peripheral artery disease) (HCC) 04/05/2020   Ulcer of right leg (HCC) 04/05/2020   COPD (chronic obstructive pulmonary disease) (HCC) 04/05/2020   GERD (gastroesophageal reflux disease) 04/05/2020   Abscess of right knee 01/22/2020   Pressure injury of skin 02/28/2019   SOB (shortness of breath)    Gastrointestinal hemorrhage    Sepsis (HCC)    Symptomatic anemia 02/20/2019   Trimalleolar fracture of ankle, closed, right, initial encounter 02/07/2019   Multiple lung nodules 03/31/2014   Extrinsic asthma 03/27/2014   OSA on CPAP 03/27/2014    PCP: Danella Penton, MD  REFERRING PROVIDER: Danella Penton, MD  REFERRING DIAG: Above knee amputation of right lower extremity  THERAPY DIAG:  Gait difficulty  S/P AKA (above knee amputation) unilateral, right (HCC)  Muscle weakness (generalized)  Abnormal posture  Rationale for Evaluation and Treatment Rehabilitation  ONSET DATE: 12/02/20  SUBJECTIVE:  EVALUATION  PERTINENT HISTORY: Patient Profile:   Jasmine Morrow is a 77 y.o. female Chief Complaint  Patient presents with  Visit Follow Up  Doing well.   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 Cardiac cath 10/2011 showed completely normal coronary arteries, however she did have mild left ventricular dysfunction with anterior wall motion abnormality. Left ventricular  EF was 51%.  History of chickenpox  Hyperlipidemia  patient states she has never been diagnosed with this  Hypertension  Knee joint replacement by other means  Macular degeneration  Major depressive disorder, recurrent, mild (CMS-HCC)  Medicare annual wellness visit, initial 08/09/2019  7/21  Multiple thyroid nodules  Normal coronary arteries 09/06/2013  Normal coronary anatomy by cardiac catheterization 10/12/11  Osteoarthritis  Ovarian cyst  PONV (postoperative nausea and vomiting)  nausea  Postoperative urinary retention 12/08/2020  Rash on lips 12/21/2020  Seasonal allergies  Sinusitis, unspecified  Sleep apnea  On CPAP.  Slow transit constipation 12/10/2020  Trimalleolar fracture of ankle, closed, right, initial encounter 02/07/2019  Complicated by staph infection 1/21, 4 weeks IV Ancef, Menz  Ulcer   Past Surgical History:  Procedure Laterality Date  SALPINGO OOPHORECTOMY Bilateral 1993  Right total knee arthroplasty 10/11/2006  Dr Ernest Pine  Left total knee arthroplasty 10/12/2007  Dr Ernest Pine  COLONOSCOPY 07/30/2012  internal hemorrhoids, diverticulosis  ANKLE ARTHODESIS W/ ARTHROSCOPY Right 02/08/2019  03/14/2019 second surgery  COLONOSCOPY 02/22/2019  Blood in entire colon/Diverticulosis - Presumed diverticular bleed. No repeat recommended per TKT.  EGD 02/22/2019  Gastritis/gastric ulcers/Hiatal hernia/Otherwise normal -  no repeat recommended per TKT.  ORIF ANKLE FRACTURE Right 03/14/2019  Dr. Rosita Kea  REMOVAL HARDWARE ANKLE FOOT/TOES Right 11/12/2019  Dr. Rosita Kea  Right knee arthrotomy, irrigation and debridement of the right knee, polyethylene exchange 01/24/2020  Dr Ernest Pine  Removal of hardware (plates and screws) from the right ankle with irrigation, debridement, and placement of Stimulan antibiotic beads 04/17/2020  Dr Ernest Pine  Right knee arthrotomy, extensive irrigation debridement, removal of right total knee implants, and placement of antibiotic impregnated  polymethylmethacrylate cement spacer 04/17/2020  Dr Ernest Pine  AMPUTATION LEG ABOVE KNEE AKA Right 12/02/2020  Procedure: AMPUTATION, THIGH, THROUGH FEMUR, ANY LEVEL; Surgeon: Starr Lake, MD; Location: [facility]; Service: Orthopedics; Laterality: Right;  TRANSFER ADJACENT TISSUE LEG Right 12/02/2020  Procedure: ADJACENT TISSUE TRANSFER OR REARRANGEMENT, LEG; DEFECT 10 SQ CM OR LESS; Surgeon: Starr Lake, MD; Location: [facility]; Service: Orthopedics; Laterality: Right;  ASPIRATION/INJECTION MAJOR JOINT/BURSA KNEE Left 12/02/2020  Procedure: ARTHROCENTESIS, ASPIRATION AND/OR INJECTION, MAJOR JOINT OR BURSA, KNEE; WITHOUT ULTRASOUND GUIDANCE; Surgeon: Starr Lake, MD; Location: [facility]; Service: Orthopedics; Laterality: Left;  REMOVAL KNEE PROSTHESIS W/POSSIBLE PLACEMENT SPACER Left 01/18/2021  Procedure: REMOVAL OF PROSTHESIS, INCLUDING TOTAL KNEE PROSTHESIS, METHYLMETHACRYLATE WITH OR WITHOUT INSERTION OF SPACER, KNEE; Surgeon: Celine Mans, MD; Location: [facility]; Service: Orthopedics; Laterality: Left;  INSERTION NON-BIODEGRADABLE DRUG DELIVERY IMPLANT Left 01/18/2021  Procedure: INSERTION, KNEE, bioresorbable, biodegradable, NON-BIODEGRADABLE DRUG DELIVERY IMPLANT; Surgeon: Celine Mans, MD; Location: [facility]; Service: Orthopedics; Laterality: Left;  ABOVE KNEE LEG AMPUTATION  12/02/2020  APPENDECTOMY Est 1968  CESAREAN SECTION  CHOLECYSTECTOMY  FRACTURE SURGERY 1/21  HYSTERECTOMY partial  JOINT REPLACEMENT 2008 & 2009  TUBAL LIGATION 3/79   ALLERGIES: Allergies  Allergen Reactions  Singulair [Montelukast] Other (See Comments)  Gland swelling  Sulfa (Sulfonamide Antibiotics) Swelling  Oxycodone Dizziness and Other (See Comments)  Vancomycin Analogues Other (See Comments)  Ringing in ears  Avinza [Morphine] Itching  Darvocet A500 [Propoxyphene N-Acetaminophen] Nausea  Nickel Rash  Ultracet  [Tramadol-Acetaminophen] Rash  Vicodin [Hydrocodone-Acetaminophen] Vomiting  Vioxx [Rofecoxib] Other (See Comments)  GI   CURRENT MEDICATIONS: Current Outpatient Medications: acetaminophen (TYLENOL) 500 MG tablet, Take 1,000 mg by mouth 3 (three) times a day Twice daily per patient., PRN Not Currently Taking aspirin 81 MG EC tablet, Take 1 tablet (81 mg total) by mouth at bedtime, Taking calcium carbonate-vitamin D3 (CALTRATE 600+D) 600 mg-10 mcg (400 unit) tablet, Take 0.5 tablets by mouth 2 (two) times daily, Taking cetirizine (ZYRTEC) 10 mg capsule, Take 1 capsule (10 mg total) by mouth once daily, Taking escitalopram oxalate (LEXAPRO) 10 MG tablet, Take 1 tablet (10 mg total) by mouth once daily, Taking estradioL (ESTRACE) 0.01 % (0.1 mg/gram) vaginal cream, Place 2 g vaginally twice a week, Taking folic acid/multivit,iron,miner (MULTIVIT-IRON-MIN-FOLIC ACID ORAL), Take 1 tablet by mouth once daily, Taking gabapentin (NEURONTIN) 300 MG capsule, Take 1 capsule (300 mg total) by mouth 3 (three) times daily Phantom limb pain, Taking melatonin 3 mg tablet, Take 1 tablet (3 mg total) by mouth at bedtime, Taking multivitamin with iron (COMPLETE MULTIVITAMIN-MINERAL) tablet, Take 1 tablet by mouth, Taking multivitamin with minerals, EYE, (PRESERVISION AREDS 2) soft gel capsule, Take 1 capsule by mouth 2 (two) times daily, Taking nystatin (MYCOSTATIN) 100,000 unit/gram powder, Apply small amount topically twice daily for rash under breasts, PRN Not Currently Taking pantoprazole (PROTONIX) 40 MG DR tablet, Take 1 tablet (40 mg total) by mouth 2 (two) times daily. Please call to schedule an appt. Thanks!,  Taking polyethylene glycol (MIRALAX) powder, Take 17 g by mouth once daily Mix in 4-8ounces of fluid prior to taking., Taking sennosides-docusate (SENOKOT-S) 8.6-50 mg tablet, Take 1 tablet by mouth 2 (two) times daily For constipation, Taking lactose-reduced food (ENSURE PLUS ORAL), Take 237 mLs by  mouth 2 (two) times daily With breakfast and dinner  HPI   CLINICAL SUMMARY:  Patient post right AKA and working with prosthesis. 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 Able to do housework, improving  PAIN:  Are you having pain? Yes: NPRS scale: 5/10 Pain location: L knee Pain description: aching Aggravating factors: walking Relieving factors: rest  PRECAUTIONS: None  WEIGHT BEARING RESTRICTIONS No  FALLS:  Has patient fallen in last 6 months? No  LIVING ENVIRONMENT: Lives with: lives with their spouse Lives in: House/apartment Stairs: No Has following equipment at home: Dan Humphreys - 4 wheeled and Wheelchair (manual)  OCCUPATION: retired  PLOF: Requires assistive device for independence  PATIENT GOALS  improve standing tolerance/ pull up pants/ ambulate with SPC safely.    OBJECTIVE:   PATIENT SURVEYS:  FOTO initial 46/ goal 98.   11/2: 50  COGNITION:  Overall cognitive status: Within functional limits for tasks assessed     SENSATION: WFL  POSTURE: rounded shoulders, forward head, flexed trunk , and weight shift left  PALPATION: No tenderness along R distal residual limb.  Discussed phantom limb sensation.    LOWER EXTREMITY ROM:  B LE AROM WFL except L knee extension secondary to articulating spacer.  Did not assess L/R hip extension at this time.    LOWER EXTREMITY MMT:  MMT Right eval Left eval  Hip flexion 4/5 4/5  Hip extension    Hip abduction 4+/5 4+/5  Hip adduction 4+/5 4+/5  Hip internal rotation    Hip external rotation    Knee flexion N/A 4+/5  Knee extension N/A 4+/5  Ankle dorsiflexion  5/5  Ankle plantarflexion    Ankle inversion    Ankle eversion     (Blank rows = not tested)  GAIT: Distance walked: in gym/ //-bars Assistive device utilized: Environmental consultant - 2 wheeled Level of assistance: CGA Comments: Cuing for posture correction with mirror feedback.  Flexed posture/ heavy UE assist.    TUG: UE assist  required to stand/ use of RW.  51.9 seconds.     TODAY'S TREATMENT:  08/25/2022  Subjective: Pt. Arrived to PT with husband and donned prosthetic leg on blue mat table.  Pts. Fit of prosthetic leg was limited today and strap had to be adjusted multiple times before gait.  Patient eager to attend beach trip in 2 months and wanting to focus on stair training.   Objective:   There.ex.:  No charge   Nustep L5 10+ mins (0.5 miles) B UE/LE (consistent cadence/ no residual limb pain or tenderness reported).                Gait training:    Amb. From blue mat table to Nustep with use of RW and focus on step through gait.  SBA/CGA on R to ensure R knee mechanism locks out before wt. Bearing.  No verbal cuing required and consistent gait pattern/ step pattern.  PT adjusted/tightened fit several times.       Stair training with B handrails forward on ascent and descent.  Step to gait pattern with heavy UE assist (4 steps x 3)- seated rest break after 1st series of 8 stairs.  PT reviewed  ascending stairs with side stepping pattern/ 1 handrail (pt. Fearful/ loss of BOS).  Complaining of pain in R residual limb. Requesting to terminate exercise. Attempted to ambulate but continued pain required patient to return to w/c.     NOT TODAY:  In // bars, ambulated fwd with use of rollator and bwd with use of // bars for stability. Use of mirror for visual feedback. Cues for upright posture. CGA for safety to ensure R knee mechanism locks out before weight bearing.   Ambulate with use of rollator from Nustep to stairs with focus on step through gait. Intermittent cueing for upright posture. SBA/CGA on R to ensure  R knee mechanism locks out before weightbearing. Moderate fatigue in B shoulders.    Amb. With RW from stairs around gym 1x and pt. Required seated rest break in w/c at gym door prior to returning to car.  Pt. Limited by increase B shoulder muscle fatigue/ discomfort.  No episodes of R knee mechanism  buckling.     PATIENT EDUCATION:  Education details: HEP/ gait and prosthetic training.  Person educated: Patient and Spouse Education method: Explanation, Demonstration, and Verbal cues Education comprehension: verbalized understanding, returned demonstration, and tactile cues required   HOME EXERCISE PROGRAM: Prone position hip stretches/ Standing marching at kitchen counter with w/c behind patient.      ASSESSMENT:  CLINICAL IMPRESSION:    Patient presents to PT in w/c with prosthetic leg in hand. Highly motivated to become more independent with use of RW as well as introducing use of rollator for mobility. Continues to ambulate with good BOS/step pattern with no episodes of R knee mechanism buckling this session. Session focused on stair training with B handrails forwards in preparation for trip in 2 months. Patient complaining of R residual limb pain during 2nd bout of stairs and attempted ambulating but requested to terminate exercise due to increase in pain. Described as phantom pain that extended up into residual limb. Patient will continue to benefit from skilled PT services to improve standing tolerance/ gait independence and ADL.  OBJECTIVE IMPAIRMENTS Abnormal gait, decreased activity tolerance, decreased balance, decreased endurance, decreased mobility, difficulty walking, decreased ROM, decreased strength, decreased safety awareness, impaired flexibility, improper body mechanics, prosthetic dependency , and pain.   ACTIVITY LIMITATIONS carrying, lifting, standing, squatting, stairs, transfers, bed mobility, bathing, toileting, dressing, hygiene/grooming, and locomotion level  PARTICIPATION LIMITATIONS: cleaning, laundry, driving, shopping, community activity, and yard work  PERSONAL FACTORS Past/current experiences are also affecting patient's functional outcome.   REHAB POTENTIAL: Good  CLINICAL DECISION MAKING: Evolving/moderate complexity  EVALUATION COMPLEXITY:  High   GOALS: Goals reviewed with patient Yes  LONG TERM GOALS: Target date: 09/06/22  Pt. Will increase FOTO to 54 to improve functional mobility.  Baseline: initial FOTO 46.  11/2: 50.  4/25: 60  Goal status: Goal met  2.  Pt. Able to tolerate standing 10 minutes while pulling up pants with mod. I safely while wearing prosthetic leg to improve daily activities.  Baseline:  limited standing tolerance Goal status: Partially met  3.  Pt. Will ambulate 200 feet with use of least assistive device and mod. I to promote safety with household tasks/ walking into bathroom.   Baseline:  amb. With RW and min. A for safety.  2/27: walking in clinic with SBA/mod. I for 100 feet with RW and use of prosthetic leg.  6/11: amb. With RW or rollator <100 feet with SBA for RW/ CGA for rollator. 7/18: 120' with SBA with RW/CGA  for rollator Goal status: Ongoing  4.  Pt. Will ascend 10 stairs with use of single forearm crutch/ handrail and step to pattern with mod. I safely.  Baseline:  see above; 7/18: ascends 4 stairs with step to pattern, with B rails, and CGA Goal status: Ongoing  PLAN: PT FREQUENCY: 2x/week  PT DURATION: 8 weeks  PLANNED INTERVENTIONS: Therapeutic exercises, Therapeutic activity, Neuromuscular re-education, Balance training, Gait training, Patient/Family education, Self Care, Joint mobilization, Prosthetic training, DME instructions, and Manual therapy  PLAN FOR NEXT SESSION:  Progress gait/ standing tolerance.  F/u on upright rollator.  FOCUS ON STAIRS to prepare for beach trip in 2 months (14 stairs to enter house).      Maylon Peppers, PT, DPT Physical Therapist - [place]  [facility] [place]  3:05 PM,08/25/22

## 2022-08-29 ENCOUNTER — Ambulatory Visit: Payer: Medicare Other | Admitting: Physical Therapy

## 2022-08-29 ENCOUNTER — Encounter: Payer: Self-pay | Admitting: Physical Therapy

## 2022-08-29 DIAGNOSIS — R269 Unspecified abnormalities of gait and mobility: Secondary | ICD-10-CM

## 2022-08-29 DIAGNOSIS — Z89611 Acquired absence of right leg above knee: Secondary | ICD-10-CM

## 2022-08-29 DIAGNOSIS — R293 Abnormal posture: Secondary | ICD-10-CM

## 2022-08-29 DIAGNOSIS — M6281 Muscle weakness (generalized): Secondary | ICD-10-CM

## 2022-08-29 NOTE — Therapy (Signed)
OUTPATIENT PHYSICAL THERAPY LOWER EXTREMITY TREATMENT   Patient Name: Jasmine Morrow MRN: [id number] DOB:November 06, 1945, 77 y.o., female Today's Date: 08/29/2022    PT End of Session - 08/29/22 1211     Visit Number 73    Number of Visits 78    Date for PT Re-Evaluation 09/06/22    PT Start Time 1023    PT Stop Time 1125    PT Time Calculation (min) 62 min    Activity Tolerance Patient tolerated treatment well    Behavior During Therapy WFL for tasks assessed/performed               Past Medical History:  Diagnosis Date   Anemia    Anxiety    Asthma    seasonal    GERD (gastroesophageal reflux disease)    H/O above knee amputation, 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   Hypertension    PONV (postoperative nausea and vomiting)    Seasonal allergies    Sleep apnea    does not uses CPAP machine anymore    Wheelchair dependent    able to self transfer   Past Surgical History:  Procedure Laterality Date   APPENDECTOMY     CATARACT EXTRACTION W/PHACO Right 02/28/2022   Procedure: CATARACT EXTRACTION PHACO AND INTRAOCULAR LENS PLACEMENT (IOC) RIGHT  4.82  00:30.9;  Surgeon: Nevada Crane, MD;  Location: [facility];  Service: Ophthalmology;  Laterality: Right;  sleep apnea   CATARACT EXTRACTION W/PHACO Left 03/14/2022   Procedure: CATARACT EXTRACTION PHACO AND INTRAOCULAR LENS PLACEMENT (IOC) LEFT;  Surgeon: Nevada Crane, MD;  Location: [facility];  Service: Ophthalmology;  Laterality: Left;  4.32 0:39.7   CESAREAN SECTION     CHOLECYSTECTOMY     DILATION AND CURETTAGE OF UTERUS     ESOPHAGOGASTRODUODENOSCOPY N/A 02/22/2019   Procedure: ESOPHAGOGASTRODUODENOSCOPY (EGD);  Surgeon: Toledo, Boykin Nearing, MD;  Location: [facility];  Service: Gastroenterology;  Laterality: N/A;   FLEXIBLE SIGMOIDOSCOPY N/A 02/22/2019   Procedure: FLEXIBLE SIGMOIDOSCOPY;  Surgeon: Toledo, Boykin Nearing, MD;  Location: [facility];  Service: Gastroenterology;  Laterality: N/A;    HARDWARE REMOVAL Right 11/12/2019   Procedure: Right ankle hardware removal;  Surgeon: Kennedy Bucker, MD;  Location: [facility];  Service: Orthopedics;  Laterality: Right;   HARDWARE REMOVAL Right 04/17/2020   Procedure: HARDWARE REMOVAL FROM RIGHT ANKLE AND RIGHT KNEE; IMPLANT OF CEMENT SPACERS IN RIGHT KNEE;  Surgeon: Donato Heinz, MD;  Location: [facility];  Service: Orthopedics;  Laterality: Right;   JOINT REPLACEMENT     LEG AMPUTATION ABOVE KNEE Right 12/02/2020   [facility]   ORIF ANKLE FRACTURE Right 03/14/2019   Procedure: OPEN REDUCTION INTERNAL FIXATION (ORIF) ANKLE FRACTURE, MEDIAL MALLEOLUS;  Surgeon: Kennedy Bucker, MD;  Location: [facility];  Service: Orthopedics;  Laterality: Right;   ORIF ANKLE FRACTURE Right 02/08/2019   Procedure: OPEN REDUCTION INTERNAL FIXATION (ORIF) ANKLE FRACTURE, post malleolus;  Surgeon: Kennedy Bucker, MD;  Location: [facility];  Service: Orthopedics;  Laterality: Right;   PARTIAL HYSTERECTOMY  1993   REPLACEMENT TOTAL KNEE Bilateral 2008   2009   SCAR DEBRIDEMENT OF TOTAL KNEE  01/2020   SYNDESMOSIS REPAIR Right 02/08/2019   Procedure: SYNDESMOSIS REPAIR;  Surgeon: Kennedy Bucker, MD;  Location: [facility];  Service: Orthopedics;  Laterality: Right;   TEE WITHOUT CARDIOVERSION N/A 02/26/2019   Procedure: TRANSESOPHAGEAL ECHOCARDIOGRAM (TEE);  Surgeon: Lamar Blinks, MD;  Location: [facility];  Service: Cardiovascular;  Laterality: N/A;   TOTAL KNEE REVISION WITH SCAR  DEBRIDEMENT/PATELLA REVISION WITH POLY EXCHANGE Right 01/24/2020   Procedure: TOTAL KNEE REVISION WITH SCAR DEBRIDEMENT/PATELLA REVISION WITH POLY EXCHANGE;  Surgeon: Donato Heinz, MD;  Location: [facility];  Service: Orthopedics;  Laterality: Right;   TUMOR REMOVAL     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   Patient Active Problem List   Diagnosis Date Noted   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   Anxiety 04/17/2020   History of GI diverticular bleed 04/17/2020   Chest pain  04/17/2020   PAD (peripheral artery disease) (HCC) 04/05/2020   Ulcer of right leg (HCC) 04/05/2020   COPD (chronic obstructive pulmonary disease) (HCC) 04/05/2020   GERD (gastroesophageal reflux disease) 04/05/2020   Abscess of right knee 01/22/2020   Pressure injury of skin 02/28/2019   SOB (shortness of breath)    Gastrointestinal hemorrhage    Sepsis (HCC)    Symptomatic anemia 02/20/2019   Trimalleolar fracture of ankle, closed, right, initial encounter 02/07/2019   Multiple lung nodules 03/31/2014   Extrinsic asthma 03/27/2014   OSA on CPAP 03/27/2014    PCP: Danella Penton, MD  REFERRING PROVIDER: Danella Penton, MD  REFERRING DIAG: Above knee amputation of right lower extremity  THERAPY DIAG:  Gait difficulty  S/P AKA (above knee amputation) unilateral, right (HCC)  Muscle weakness (generalized)  Abnormal posture  Rationale for Evaluation and Treatment Rehabilitation  ONSET DATE: 12/02/20  SUBJECTIVE:  EVALUATION  PERTINENT HISTORY: Patient Profile:   Jasmine Morrow is a 77 y.o. female Chief Complaint  Patient presents with  Visit Follow Up  Doing well.   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 Cardiac cath 10/2011 showed completely normal coronary arteries, however she did have mild left ventricular dysfunction with anterior wall motion abnormality. Left ventricular  EF was 51%.  History of chickenpox  Hyperlipidemia  patient states she has never been diagnosed with this  Hypertension  Knee joint replacement by other means  Macular degeneration  Major depressive disorder, recurrent, mild (CMS-HCC)  Medicare annual wellness visit, initial 08/09/2019  7/21  Multiple thyroid nodules  Normal coronary arteries 09/06/2013  Normal coronary anatomy by cardiac catheterization 10/12/11  Osteoarthritis  Ovarian cyst  PONV (postoperative nausea and vomiting)  nausea  Postoperative urinary retention 12/08/2020  Rash on lips 12/21/2020  Seasonal allergies  Sinusitis, unspecified  Sleep apnea  On CPAP.  Slow transit constipation 12/10/2020  Trimalleolar fracture of ankle, closed, right, initial encounter 02/07/2019  Complicated by staph infection 1/21, 4 weeks IV Ancef, Menz  Ulcer   Past Surgical History:  Procedure Laterality Date  SALPINGO OOPHORECTOMY Bilateral 1993  Right total knee arthroplasty 10/11/2006  Dr Ernest Pine  Left total knee arthroplasty 10/12/2007  Dr Ernest Pine  COLONOSCOPY 07/30/2012  internal hemorrhoids, diverticulosis  ANKLE ARTHODESIS W/ ARTHROSCOPY Right 02/08/2019  03/14/2019 second surgery  COLONOSCOPY 02/22/2019  Blood in entire colon/Diverticulosis - Presumed diverticular bleed. No repeat recommended per TKT.  EGD 02/22/2019  Gastritis/gastric ulcers/Hiatal hernia/Otherwise normal -  no repeat recommended per TKT.  ORIF ANKLE FRACTURE Right 03/14/2019  Dr. Rosita Kea  REMOVAL HARDWARE ANKLE FOOT/TOES Right 11/12/2019  Dr. Rosita Kea  Right knee arthrotomy, irrigation and debridement of the right knee, polyethylene exchange 01/24/2020  Dr Ernest Pine  Removal of hardware (plates and screws) from the right ankle with irrigation, debridement, and placement of Stimulan antibiotic beads 04/17/2020  Dr Ernest Pine  Right knee arthrotomy, extensive irrigation debridement, removal of right total knee implants, and placement of antibiotic impregnated  polymethylmethacrylate cement spacer 04/17/2020  Dr Ernest Pine  AMPUTATION LEG ABOVE KNEE AKA Right 12/02/2020  Procedure: AMPUTATION, THIGH, THROUGH FEMUR, ANY LEVEL; Surgeon: Starr Lake, MD; Location: [facility]; Service: Orthopedics; Laterality: Right;  TRANSFER ADJACENT TISSUE LEG Right 12/02/2020  Procedure: ADJACENT TISSUE TRANSFER OR REARRANGEMENT, LEG; DEFECT 10 SQ CM OR LESS; Surgeon: Starr Lake, MD; Location: [facility]; Service: Orthopedics; Laterality: Right;  ASPIRATION/INJECTION MAJOR JOINT/BURSA KNEE Left 12/02/2020  Procedure: ARTHROCENTESIS, ASPIRATION AND/OR INJECTION, MAJOR JOINT OR BURSA, KNEE; WITHOUT ULTRASOUND GUIDANCE; Surgeon: Starr Lake, MD; Location: [facility]; Service: Orthopedics; Laterality: Left;  REMOVAL KNEE PROSTHESIS W/POSSIBLE PLACEMENT SPACER Left 01/18/2021  Procedure: REMOVAL OF PROSTHESIS, INCLUDING TOTAL KNEE PROSTHESIS, METHYLMETHACRYLATE WITH OR WITHOUT INSERTION OF SPACER, KNEE; Surgeon: Celine Mans, MD; Location: [facility]; Service: Orthopedics; Laterality: Left;  INSERTION NON-BIODEGRADABLE DRUG DELIVERY IMPLANT Left 01/18/2021  Procedure: INSERTION, KNEE, bioresorbable, biodegradable, NON-BIODEGRADABLE DRUG DELIVERY IMPLANT; Surgeon: Celine Mans, MD; Location: [facility]; Service: Orthopedics; Laterality: Left;  ABOVE KNEE LEG AMPUTATION  12/02/2020  APPENDECTOMY Est 1968  CESAREAN SECTION  CHOLECYSTECTOMY  FRACTURE SURGERY 1/21  HYSTERECTOMY partial  JOINT REPLACEMENT 2008 & 2009  TUBAL LIGATION 3/79   ALLERGIES: Allergies  Allergen Reactions  Singulair [Montelukast] Other (See Comments)  Gland swelling  Sulfa (Sulfonamide Antibiotics) Swelling  Oxycodone Dizziness and Other (See Comments)  Vancomycin Analogues Other (See Comments)  Ringing in ears  Avinza [Morphine] Itching  Darvocet A500 [Propoxyphene N-Acetaminophen] Nausea  Nickel Rash  Ultracet  [Tramadol-Acetaminophen] Rash  Vicodin [Hydrocodone-Acetaminophen] Vomiting  Vioxx [Rofecoxib] Other (See Comments)  GI   CURRENT MEDICATIONS: Current Outpatient Medications: acetaminophen (TYLENOL) 500 MG tablet, Take 1,000 mg by mouth 3 (three) times a day Twice daily per patient., PRN Not Currently Taking aspirin 81 MG EC tablet, Take 1 tablet (81 mg total) by mouth at bedtime, Taking calcium carbonate-vitamin D3 (CALTRATE 600+D) 600 mg-10 mcg (400 unit) tablet, Take 0.5 tablets by mouth 2 (two) times daily, Taking cetirizine (ZYRTEC) 10 mg capsule, Take 1 capsule (10 mg total) by mouth once daily, Taking escitalopram oxalate (LEXAPRO) 10 MG tablet, Take 1 tablet (10 mg total) by mouth once daily, Taking estradioL (ESTRACE) 0.01 % (0.1 mg/gram) vaginal cream, Place 2 g vaginally twice a week, Taking folic acid/multivit,iron,miner (MULTIVIT-IRON-MIN-FOLIC ACID ORAL), Take 1 tablet by mouth once daily, Taking gabapentin (NEURONTIN) 300 MG capsule, Take 1 capsule (300 mg total) by mouth 3 (three) times daily Phantom limb pain, Taking melatonin 3 mg tablet, Take 1 tablet (3 mg total) by mouth at bedtime, Taking multivitamin with iron (COMPLETE MULTIVITAMIN-MINERAL) tablet, Take 1 tablet by mouth, Taking multivitamin with minerals, EYE, (PRESERVISION AREDS 2) soft gel capsule, Take 1 capsule by mouth 2 (two) times daily, Taking nystatin (MYCOSTATIN) 100,000 unit/gram powder, Apply small amount topically twice daily for rash under breasts, PRN Not Currently Taking pantoprazole (PROTONIX) 40 MG DR tablet, Take 1 tablet (40 mg total) by mouth 2 (two) times daily. Please call to schedule an appt. Thanks!,  Taking polyethylene glycol (MIRALAX) powder, Take 17 g by mouth once daily Mix in 4-8ounces of fluid prior to taking., Taking sennosides-docusate (SENOKOT-S) 8.6-50 mg tablet, Take 1 tablet by mouth 2 (two) times daily For constipation, Taking lactose-reduced food (ENSURE PLUS ORAL), Take 237 mLs by  mouth 2 (two) times daily With breakfast and dinner  HPI   CLINICAL SUMMARY:  Patient post right AKA and working with prosthesis. 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 Able to do housework, improving  PAIN:  Are you having pain? Yes: NPRS scale: 5/10 Pain location: L knee Pain description: aching Aggravating factors: walking Relieving factors: rest  PRECAUTIONS: None  WEIGHT BEARING RESTRICTIONS No  FALLS:  Has patient fallen in last 6 months? No  LIVING ENVIRONMENT: Lives with: lives with their spouse Lives in: House/apartment Stairs: No Has following equipment at home: Dan Humphreys - 4 wheeled and Wheelchair (manual)  OCCUPATION: retired  PLOF: Requires assistive device for independence  PATIENT GOALS  improve standing tolerance/ pull up pants/ ambulate with SPC safely.    OBJECTIVE:   PATIENT SURVEYS:  FOTO initial 46/ goal 14.   11/2: 50  COGNITION:  Overall cognitive status: Within functional limits for tasks assessed     SENSATION: WFL  POSTURE: rounded shoulders, forward head, flexed trunk , and weight shift left  PALPATION: No tenderness along R distal residual limb.  Discussed phantom limb sensation.    LOWER EXTREMITY ROM:  B LE AROM WFL except L knee extension secondary to articulating spacer.  Did not assess L/R hip extension at this time.    LOWER EXTREMITY MMT:  MMT Right eval Left eval  Hip flexion 4/5 4/5  Hip extension    Hip abduction 4+/5 4+/5  Hip adduction 4+/5 4+/5  Hip internal rotation    Hip external rotation    Knee flexion N/A 4+/5  Knee extension N/A 4+/5  Ankle dorsiflexion  5/5  Ankle plantarflexion    Ankle inversion    Ankle eversion     (Blank rows = not tested)  GAIT: Distance walked: in gym/ //-bars Assistive device utilized: Environmental consultant - 2 wheeled Level of assistance: CGA Comments: Cuing for posture correction with mirror feedback.  Flexed posture/ heavy UE assist.    TUG: UE assist  required to stand/ use of RW.  51.9 seconds.     TODAY'S TREATMENT:  08/29/2022  Subjective: Pt. Arrived to PT with husband and donned prosthetic leg on blue mat table.  Patient eager to attend beach trip in 2 months and wanting to focus on stair training.  Pts. Husband discussed pts. Ability to get in/out of car with prosthetic leg on.  Pts. Husband concerned about ability of pt. To ascend stairs to enter beach house.    Objective:   There.ex.:  No charge   Nustep L5 10+ mins (0.5 miles) B UE/LE (consistent cadence/ no residual limb pain or tenderness reported).                Gait training:    Amb. From blue mat table to Nustep with use of RW and focus on step through gait.  SBA/CGA on R to ensure R knee mechanism locks out before wt. Bearing.  No verbal cuing required and consistent gait pattern/ step pattern.  PT adjusted/tightened fit several times.       Stair training with B handrails forward on ascent and descent.  Step to gait pattern with heavy UE assist (4 steps x 3)-  seated rest break required.  Pt. Unable to start 4th attempt at stairs at this time.    Neuro. Mm.:  Sit to stand from passenger side of car with prosthetic leg donned.  Mod. I/SBA with extra time and UE assist to stand safely into RW.  Pt. Able to manage position of prosthetic leg in passenger side of car.  Pt. Is hoping to don leg on way to church and walk into side door with RW.       NOT TODAY:  In // bars, ambulated fwd with use of rollator and bwd with use of // bars for stability. Use of mirror for visual feedback. Cues for upright posture. CGA for safety to ensure R knee mechanism locks out before weight bearing.   Ambulate with use of rollator from Nustep to stairs with focus on step through gait. Intermittent cueing for upright posture. SBA/CGA on R to ensure  R knee mechanism locks out before weightbearing. Moderate fatigue in B shoulders.    Amb. With RW from stairs around gym 1x and pt. Required seated  rest break in w/c at gym door prior to returning to car.  Pt. Limited by increase B shoulder muscle fatigue/ discomfort.  No episodes of R knee mechanism buckling.     PATIENT EDUCATION:  Education details: HEP/ gait and prosthetic training.  Person educated: Patient and Spouse Education method: Explanation, Demonstration, and Verbal cues Education comprehension: verbalized understanding, returned demonstration, and tactile cues required   HOME EXERCISE PROGRAM: Prone position hip stretches/ Standing marching at kitchen counter with w/c behind patient.      ASSESSMENT:  CLINICAL IMPRESSION:    Patient presents to PT in w/c with prosthetic leg in hand. Highly motivated to become more independent with use of RW as well as introducing use of rollator for mobility. Continues to ambulate with good BOS/step pattern with no episodes of R knee mechanism buckling this session. Session focused on stair training with B handrails forwards in preparation for trip in 2 months. No residual limb discomfort today.  Patient will continue to benefit from skilled PT services to improve standing tolerance/ gait independence and ADL.  OBJECTIVE IMPAIRMENTS Abnormal gait, decreased activity tolerance, decreased balance, decreased endurance, decreased mobility, difficulty walking, decreased ROM, decreased strength, decreased safety awareness, impaired flexibility, improper body mechanics, prosthetic dependency , and pain.   ACTIVITY LIMITATIONS carrying, lifting, standing, squatting, stairs, transfers, bed mobility, bathing, toileting, dressing, hygiene/grooming, and locomotion level  PARTICIPATION LIMITATIONS: cleaning, laundry, driving, shopping, community activity, and yard work  PERSONAL FACTORS Past/current experiences are also affecting patient's functional outcome.   REHAB POTENTIAL: Good  CLINICAL DECISION MAKING: Evolving/moderate complexity  EVALUATION COMPLEXITY: High   GOALS: Goals reviewed  with patient Yes  LONG TERM GOALS: Target date: 09/06/22  Pt. Will increase FOTO to 54 to improve functional mobility.  Baseline: initial FOTO 46.  11/2: 50.  4/25: 60  Goal status: Goal met  2.  Pt. Able to tolerate standing 10 minutes while pulling up pants with mod. I safely while wearing prosthetic leg to improve daily activities.  Baseline:  limited standing tolerance Goal status: Partially met  3.  Pt. Will ambulate 200 feet with use of least assistive device and mod. I to promote safety with household tasks/ walking into bathroom.   Baseline:  amb. With RW and min. A for safety.  2/27: walking in clinic with SBA/mod. I for 100 feet with RW and use of prosthetic leg.  6/11: amb. With RW or  rollator <100 feet with SBA for RW/ CGA for rollator. 7/18: 120' with SBA with RW/CGA for rollator Goal status: Ongoing  4.  Pt. Will ascend 10 stairs with use of single forearm crutch/ handrail and step to pattern with mod. I safely.  Baseline:  see above; 7/18: ascends 4 stairs with step to pattern, with B rails, and CGA Goal status: Ongoing  PLAN: PT FREQUENCY: 2x/week  PT DURATION: 8 weeks  PLANNED INTERVENTIONS: Therapeutic exercises, Therapeutic activity, Neuromuscular re-education, Balance training, Gait training, Patient/Family education, Self Care, Joint mobilization, Prosthetic training, DME instructions, and Manual therapy  PLAN FOR NEXT SESSION:  Progress gait/ standing tolerance.  F/u on upright rollator.  FOCUS ON STAIRS to prepare for beach trip in 2 months (14 stairs to enter house).      Cammie Mcgee, PT, DPT # [phone] Physical Therapist - [place]  [facility]  12:13 PM,08/29/22

## 2022-08-31 ENCOUNTER — Ambulatory Visit: Payer: Medicare Other | Admitting: Physical Therapy

## 2022-08-31 ENCOUNTER — Encounter: Payer: Self-pay | Admitting: Physical Therapy

## 2022-08-31 DIAGNOSIS — R293 Abnormal posture: Secondary | ICD-10-CM

## 2022-08-31 DIAGNOSIS — Z89611 Acquired absence of right leg above knee: Secondary | ICD-10-CM

## 2022-08-31 DIAGNOSIS — R269 Unspecified abnormalities of gait and mobility: Secondary | ICD-10-CM | POA: Diagnosis not present

## 2022-08-31 DIAGNOSIS — M6281 Muscle weakness (generalized): Secondary | ICD-10-CM

## 2022-08-31 NOTE — Therapy (Signed)
OUTPATIENT PHYSICAL THERAPY LOWER EXTREMITY TREATMENT   Patient Name: Jasmine Morrow MRN: [id number] DOB:May 25, 1945, 77 y.o., female Today's Date: 08/31/2022    PT End of Session - 08/31/22 1015     Visit Number 74    Number of Visits 78    Date for PT Re-Evaluation 09/06/22    PT Start Time 1015    PT Stop Time 1117    PT Time Calculation (min) 62 min    Activity Tolerance Patient tolerated treatment well    Behavior During Therapy WFL for tasks assessed/performed             Past Medical History:  Diagnosis Date   Anemia    Anxiety    Asthma    seasonal    GERD (gastroesophageal reflux disease)    H/O above knee amputation, 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   Hypertension    PONV (postoperative nausea and vomiting)    Seasonal allergies    Sleep apnea    does not uses CPAP machine anymore    Wheelchair dependent    able to self transfer   Past Surgical History:  Procedure Laterality Date   APPENDECTOMY     CATARACT EXTRACTION W/PHACO Right 02/28/2022   Procedure: CATARACT EXTRACTION PHACO AND INTRAOCULAR LENS PLACEMENT (IOC) RIGHT  4.82  00:30.9;  Surgeon: Nevada Crane, MD;  Location: [facility];  Service: Ophthalmology;  Laterality: Right;  sleep apnea   CATARACT EXTRACTION W/PHACO Left 03/14/2022   Procedure: CATARACT EXTRACTION PHACO AND INTRAOCULAR LENS PLACEMENT (IOC) LEFT;  Surgeon: Nevada Crane, MD;  Location: [facility];  Service: Ophthalmology;  Laterality: Left;  4.32 0:39.7   CESAREAN SECTION     CHOLECYSTECTOMY     DILATION AND CURETTAGE OF UTERUS     ESOPHAGOGASTRODUODENOSCOPY N/A 02/22/2019   Procedure: ESOPHAGOGASTRODUODENOSCOPY (EGD);  Surgeon: Toledo, Boykin Nearing, MD;  Location: [facility];  Service: Gastroenterology;  Laterality: N/A;   FLEXIBLE SIGMOIDOSCOPY N/A 02/22/2019   Procedure: FLEXIBLE SIGMOIDOSCOPY;  Surgeon: Toledo, Boykin Nearing, MD;  Location: [facility];  Service: Gastroenterology;  Laterality: N/A;    HARDWARE REMOVAL Right 11/12/2019   Procedure: Right ankle hardware removal;  Surgeon: Kennedy Bucker, MD;  Location: [facility];  Service: Orthopedics;  Laterality: Right;   HARDWARE REMOVAL Right 04/17/2020   Procedure: HARDWARE REMOVAL FROM RIGHT ANKLE AND RIGHT KNEE; IMPLANT OF CEMENT SPACERS IN RIGHT KNEE;  Surgeon: Donato Heinz, MD;  Location: [facility];  Service: Orthopedics;  Laterality: Right;   JOINT REPLACEMENT     LEG AMPUTATION ABOVE KNEE Right 12/02/2020   [facility]   ORIF ANKLE FRACTURE Right 03/14/2019   Procedure: OPEN REDUCTION INTERNAL FIXATION (ORIF) ANKLE FRACTURE, MEDIAL MALLEOLUS;  Surgeon: Kennedy Bucker, MD;  Location: [facility];  Service: Orthopedics;  Laterality: Right;   ORIF ANKLE FRACTURE Right 02/08/2019   Procedure: OPEN REDUCTION INTERNAL FIXATION (ORIF) ANKLE FRACTURE, post malleolus;  Surgeon: Kennedy Bucker, MD;  Location: [facility];  Service: Orthopedics;  Laterality: Right;   PARTIAL HYSTERECTOMY  1993   REPLACEMENT TOTAL KNEE Bilateral 2008   2009   SCAR DEBRIDEMENT OF TOTAL KNEE  01/2020   SYNDESMOSIS REPAIR Right 02/08/2019   Procedure: SYNDESMOSIS REPAIR;  Surgeon: Kennedy Bucker, MD;  Location: [facility];  Service: Orthopedics;  Laterality: Right;   TEE WITHOUT CARDIOVERSION N/A 02/26/2019   Procedure: TRANSESOPHAGEAL ECHOCARDIOGRAM (TEE);  Surgeon: Lamar Blinks, MD;  Location: [facility];  Service: Cardiovascular;  Laterality: N/A;   TOTAL KNEE REVISION WITH SCAR DEBRIDEMENT/PATELLA REVISION  WITH POLY EXCHANGE Right 01/24/2020   Procedure: TOTAL KNEE REVISION WITH SCAR DEBRIDEMENT/PATELLA REVISION WITH POLY EXCHANGE;  Surgeon: Donato Heinz, MD;  Location: [facility];  Service: Orthopedics;  Laterality: Right;   TUMOR REMOVAL     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   Patient Active Problem List   Diagnosis Date Noted   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   Anxiety 04/17/2020   History of GI diverticular bleed 04/17/2020   Chest pain  04/17/2020   PAD (peripheral artery disease) (HCC) 04/05/2020   Ulcer of right leg (HCC) 04/05/2020   COPD (chronic obstructive pulmonary disease) (HCC) 04/05/2020   GERD (gastroesophageal reflux disease) 04/05/2020   Abscess of right knee 01/22/2020   Pressure injury of skin 02/28/2019   SOB (shortness of breath)    Gastrointestinal hemorrhage    Sepsis (HCC)    Symptomatic anemia 02/20/2019   Trimalleolar fracture of ankle, closed, right, initial encounter 02/07/2019   Multiple lung nodules 03/31/2014   Extrinsic asthma 03/27/2014   OSA on CPAP 03/27/2014    PCP: Danella Penton, MD  REFERRING PROVIDER: Danella Penton, MD  REFERRING DIAG: Above knee amputation of right lower extremity  THERAPY DIAG:  Gait difficulty  S/P AKA (above knee amputation) unilateral, right (HCC)  Muscle weakness (generalized)  Abnormal posture  Rationale for Evaluation and Treatment Rehabilitation  ONSET DATE: 12/02/20  SUBJECTIVE:  EVALUATION  PERTINENT HISTORY: Patient Profile:   Jasmine Morrow is a 77 y.o. female Chief Complaint  Patient presents with  Visit Follow Up  Doing well.   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 Cardiac cath 10/2011 showed completely normal coronary arteries, however she did have mild left ventricular dysfunction with anterior wall motion abnormality. Left ventricular  EF was 51%.  History of chickenpox  Hyperlipidemia  patient states she has never been diagnosed with this  Hypertension  Knee joint replacement by other means  Macular degeneration  Major depressive disorder, recurrent, mild (CMS-HCC)  Medicare annual wellness visit, initial 08/09/2019  7/21  Multiple thyroid nodules  Normal coronary arteries 09/06/2013  Normal coronary anatomy by cardiac catheterization 10/12/11  Osteoarthritis  Ovarian cyst  PONV (postoperative nausea and vomiting)  nausea  Postoperative urinary retention 12/08/2020  Rash on lips 12/21/2020  Seasonal allergies  Sinusitis, unspecified  Sleep apnea  On CPAP.  Slow transit constipation 12/10/2020  Trimalleolar fracture of ankle, closed, right, initial encounter 02/07/2019  Complicated by staph infection 1/21, 4 weeks IV Ancef, Menz  Ulcer   Past Surgical History:  Procedure Laterality Date  SALPINGO OOPHORECTOMY Bilateral 1993  Right total knee arthroplasty 10/11/2006  Dr Ernest Pine  Left total knee arthroplasty 10/12/2007  Dr Ernest Pine  COLONOSCOPY 07/30/2012  internal hemorrhoids, diverticulosis  ANKLE ARTHODESIS W/ ARTHROSCOPY Right 02/08/2019  03/14/2019 second surgery  COLONOSCOPY 02/22/2019  Blood in entire colon/Diverticulosis - Presumed diverticular bleed. No repeat recommended per TKT.  EGD 02/22/2019  Gastritis/gastric ulcers/Hiatal hernia/Otherwise normal - no repeat  recommended per TKT.  ORIF ANKLE FRACTURE Right 03/14/2019  Dr. Rosita Kea  REMOVAL HARDWARE ANKLE FOOT/TOES Right 11/12/2019  Dr. Rosita Kea  Right knee arthrotomy, irrigation and debridement of the right knee, polyethylene exchange 01/24/2020  Dr Ernest Pine  Removal of hardware (plates and screws) from the right ankle with irrigation, debridement, and placement of Stimulan antibiotic beads 04/17/2020  Dr Ernest Pine  Right knee arthrotomy, extensive irrigation debridement, removal of right total knee implants, and placement of antibiotic impregnated  polymethylmethacrylate cement spacer 04/17/2020  Dr Ernest Pine  AMPUTATION LEG ABOVE KNEE AKA Right 12/02/2020  Procedure: AMPUTATION, THIGH, THROUGH FEMUR, ANY LEVEL; Surgeon: Starr Lake, MD; Location: [facility]; Service: Orthopedics; Laterality: Right;  TRANSFER ADJACENT TISSUE LEG Right 12/02/2020  Procedure: ADJACENT TISSUE TRANSFER OR REARRANGEMENT, LEG; DEFECT 10 SQ CM OR LESS; Surgeon: Starr Lake, MD; Location: [facility]; Service: Orthopedics; Laterality: Right;  ASPIRATION/INJECTION MAJOR JOINT/BURSA KNEE Left 12/02/2020  Procedure: ARTHROCENTESIS, ASPIRATION AND/OR INJECTION, MAJOR JOINT OR BURSA, KNEE; WITHOUT ULTRASOUND GUIDANCE; Surgeon: Starr Lake, MD; Location: [facility]; Service: Orthopedics; Laterality: Left;  REMOVAL KNEE PROSTHESIS W/POSSIBLE PLACEMENT SPACER Left 01/18/2021  Procedure: REMOVAL OF PROSTHESIS, INCLUDING TOTAL KNEE PROSTHESIS, METHYLMETHACRYLATE WITH OR WITHOUT INSERTION OF SPACER, KNEE; Surgeon: Celine Mans, MD; Location: [facility]; Service: Orthopedics; Laterality: Left;  INSERTION NON-BIODEGRADABLE DRUG DELIVERY IMPLANT Left 01/18/2021  Procedure: INSERTION, KNEE, bioresorbable, biodegradable, NON-BIODEGRADABLE DRUG DELIVERY IMPLANT; Surgeon: Celine Mans, MD; Location: [facility]; Service: Orthopedics; Laterality: Left;  ABOVE KNEE LEG AMPUTATION  12/02/2020  APPENDECTOMY Est 1968  CESAREAN SECTION  CHOLECYSTECTOMY  FRACTURE SURGERY 1/21  HYSTERECTOMY partial  JOINT REPLACEMENT 2008 & 2009  TUBAL LIGATION 3/79   ALLERGIES: Allergies  Allergen Reactions  Singulair [Montelukast] Other (See Comments)  Gland swelling  Sulfa (Sulfonamide Antibiotics) Swelling  Oxycodone Dizziness and Other (See Comments)  Vancomycin Analogues Other (See Comments)  Ringing in ears  Avinza [Morphine] Itching  Darvocet A500 [Propoxyphene N-Acetaminophen] Nausea  Nickel Rash  Ultracet  [Tramadol-Acetaminophen] Rash  Vicodin [Hydrocodone-Acetaminophen] Vomiting  Vioxx [Rofecoxib] Other (See Comments)  GI   CURRENT MEDICATIONS: Current Outpatient Medications: acetaminophen (TYLENOL) 500 MG tablet, Take 1,000 mg by mouth 3 (three) times a day Twice daily per patient., PRN Not Currently Taking aspirin 81 MG EC tablet, Take 1 tablet (81 mg total) by mouth at bedtime, Taking calcium carbonate-vitamin D3 (CALTRATE 600+D) 600 mg-10 mcg (400 unit) tablet, Take 0.5 tablets by mouth 2 (two) times daily, Taking cetirizine (ZYRTEC) 10 mg capsule, Take 1 capsule (10 mg total) by mouth once daily, Taking escitalopram oxalate (LEXAPRO) 10 MG tablet, Take 1 tablet (10 mg total) by mouth once daily, Taking estradioL (ESTRACE) 0.01 % (0.1 mg/gram) vaginal cream, Place 2 g vaginally twice a week, Taking folic acid/multivit,iron,miner (MULTIVIT-IRON-MIN-FOLIC ACID ORAL), Take 1 tablet by mouth once daily, Taking gabapentin (NEURONTIN) 300 MG capsule, Take 1 capsule (300 mg total) by mouth 3 (three) times daily Phantom limb pain, Taking melatonin 3 mg tablet, Take 1 tablet (3 mg total) by mouth at bedtime, Taking multivitamin with iron (COMPLETE MULTIVITAMIN-MINERAL) tablet, Take 1 tablet by mouth, Taking multivitamin with minerals, EYE, (PRESERVISION AREDS 2) soft gel capsule, Take 1 capsule by mouth 2 (two) times daily, Taking nystatin (MYCOSTATIN) 100,000 unit/gram powder, Apply small amount topically twice daily for rash under breasts, PRN Not Currently Taking pantoprazole (PROTONIX) 40 MG DR tablet, Take 1 tablet (40 mg total) by mouth 2 (two) times daily. Please call to schedule an appt. Thanks!, Taking polyethylene  glycol (MIRALAX) powder, Take 17 g by mouth once daily Mix in 4-8ounces of fluid prior to taking., Taking sennosides-docusate (SENOKOT-S) 8.6-50 mg tablet, Take 1 tablet by mouth 2 (two) times daily For constipation, Taking lactose-reduced food (ENSURE PLUS ORAL), Take 237 mLs by  mouth 2 (two) times daily With breakfast and dinner  HPI   CLINICAL SUMMARY:  Patient post right AKA and working with prosthesis. 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 Able to do housework, improving  PAIN:  Are you having pain? Yes: NPRS scale: 5/10 Pain location: L knee Pain description: aching Aggravating factors: walking Relieving factors: rest  PRECAUTIONS: None  WEIGHT BEARING RESTRICTIONS No  FALLS:  Has patient fallen in last 6 months? No  LIVING ENVIRONMENT: Lives with: lives with their spouse Lives in: House/apartment Stairs: No Has following equipment at home: Dan Humphreys - 4 wheeled and Wheelchair (manual)  OCCUPATION: retired  PLOF: Requires assistive device for independence  PATIENT GOALS  improve standing tolerance/ pull up pants/ ambulate with SPC safely.    OBJECTIVE:   PATIENT SURVEYS:  FOTO initial 46/ goal 90.   11/2: 50  COGNITION:  Overall cognitive status: Within functional limits for tasks assessed     SENSATION: WFL  POSTURE: rounded shoulders, forward head, flexed trunk , and weight shift left  PALPATION: No tenderness along R distal residual limb.  Discussed phantom limb sensation.    LOWER EXTREMITY ROM:  B LE AROM WFL except L knee extension secondary to articulating spacer.  Did not assess L/R hip extension at this time.    LOWER EXTREMITY MMT:  MMT Right eval Left eval  Hip flexion 4/5 4/5  Hip extension    Hip abduction 4+/5 4+/5  Hip adduction 4+/5 4+/5  Hip internal rotation    Hip external rotation    Knee flexion N/A 4+/5  Knee extension N/A 4+/5  Ankle dorsiflexion  5/5  Ankle plantarflexion    Ankle inversion    Ankle eversion     (Blank rows = not tested)  GAIT: Distance walked: in gym/ //-bars Assistive device utilized: Environmental consultant - 2 wheeled Level of assistance: CGA Comments: Cuing for posture correction with mirror feedback.  Flexed posture/ heavy UE assist.    TUG: UE assist  required to stand/ use of RW.  51.9 seconds.     TODAY'S TREATMENT:  08/31/2022  Subjective: Pt. Arrived to PT with husband and donned prosthetic leg on blue mat table.  Pt. Focused on improving standing/ walking/ stair climbing endurance and independence.  Patient eager to attend beach trip in 2 months and wanting to focus on stair training.    Objective:   There.ex.:  No charge   Nustep L5 10+ mins (0.5 miles) B UE/LE (consistent cadence/ no residual limb pain or tenderness reported).                Gait training:    Amb. From blue mat table to Nustep with use of RW and focus on step through gait.  SBA/CGA on R to ensure R knee mechanism locks out before wt. Bearing.  No verbal cuing required and consistent gait pattern/ step pattern.  PT adjusted/tightened fit several times.       Stair training with B handrails forward on ascent and descent.  Step to gait pattern with heavy UE assist (4 steps x 3)- seated rest break required.    Walking in //-bars with L forearm crutch/ R //-bar with consistent recip. Gait  pattern.  CGA for safety but good R knee mechanism control (2 laps)    Neuro. Mm.:  Standing tolerance in front of //-bars.  Standing unassisted with cone reaching/ cross body/ ball passing with SPT.  Pt. Occasionally requires UE assist on //-bars to prevent LOB.  Pt. Completes 6.5 minute tolerance prior to requiring seated rest break.        NOT TODAY:  In // bars, ambulated fwd with use of rollator and bwd with use of // bars for stability. Use of mirror for visual feedback. Cues for upright posture. CGA for safety to ensure R knee mechanism locks out before weight bearing.   Ambulate with use of rollator from Nustep to stairs with focus on step through gait. Intermittent cueing for upright posture. SBA/CGA on R to ensure  R knee mechanism locks out before weightbearing. Moderate fatigue in B shoulders.    Amb. With RW from stairs around gym 1x and pt. Required seated rest break  in w/c at gym door prior to returning to car.  Pt. Limited by increase B shoulder muscle fatigue/ discomfort.  No episodes of R knee mechanism buckling.     PATIENT EDUCATION:  Education details: HEP/ gait and prosthetic training.  Person educated: Patient and Spouse Education method: Explanation, Demonstration, and Verbal cues Education comprehension: verbalized understanding, returned demonstration, and tactile cues required   HOME EXERCISE PROGRAM: Prone position hip stretches/ Standing marching at kitchen counter with w/c behind patient.      ASSESSMENT:  CLINICAL IMPRESSION:    Patient presents to PT in w/c with prosthetic leg in hand. Highly motivated to become more independent with use of RW as well as introducing use of rollator for mobility. Continues to ambulate with good BOS/step pattern with no episodes of R knee mechanism buckling this session. Session focused on stair training with B handrails forwards in preparation for trip in 2 months. No residual limb discomfort today.  Patient will continue to benefit from skilled PT services to improve standing tolerance/ gait independence and ADL.  OBJECTIVE IMPAIRMENTS Abnormal gait, decreased activity tolerance, decreased balance, decreased endurance, decreased mobility, difficulty walking, decreased ROM, decreased strength, decreased safety awareness, impaired flexibility, improper body mechanics, prosthetic dependency , and pain.   ACTIVITY LIMITATIONS carrying, lifting, standing, squatting, stairs, transfers, bed mobility, bathing, toileting, dressing, hygiene/grooming, and locomotion level  PARTICIPATION LIMITATIONS: cleaning, laundry, driving, shopping, community activity, and yard work  PERSONAL FACTORS Past/current experiences are also affecting patient's functional outcome.   REHAB POTENTIAL: Good  CLINICAL DECISION MAKING: Evolving/moderate complexity  EVALUATION COMPLEXITY: High   GOALS: Goals reviewed with patient  Yes  LONG TERM GOALS: Target date: 09/06/22  Pt. Will increase FOTO to 54 to improve functional mobility.  Baseline: initial FOTO 46.  11/2: 50.  4/25: 60  Goal status: Goal met  2.  Pt. Able to tolerate standing 10 minutes while pulling up pants with mod. I safely while wearing prosthetic leg to improve daily activities.  Baseline:  limited standing tolerance Goal status: Partially met  3.  Pt. Will ambulate 200 feet with use of least assistive device and mod. I to promote safety with household tasks/ walking into bathroom.   Baseline:  amb. With RW and min. A for safety.  2/27: walking in clinic with SBA/mod. I for 100 feet with RW and use of prosthetic leg.  6/11: amb. With RW or rollator <100 feet with SBA for RW/ CGA for rollator. 7/18: 120' with SBA with RW/CGA for rollator Goal  status: Ongoing  4.  Pt. Will ascend 10 stairs with use of single forearm crutch/ handrail and step to pattern with mod. I safely.  Baseline:  see above; 7/18: ascends 4 stairs with step to pattern, with B rails, and CGA Goal status: Ongoing  PLAN: PT FREQUENCY: 2x/week  PT DURATION: 8 weeks  PLANNED INTERVENTIONS: Therapeutic exercises, Therapeutic activity, Neuromuscular re-education, Balance training, Gait training, Patient/Family education, Self Care, Joint mobilization, Prosthetic training, DME instructions, and Manual therapy  PLAN FOR NEXT SESSION:  Progress gait/ standing tolerance.  F/u on upright rollator.  FOCUS ON STAIRS to prepare for beach trip in 2 months (14 stairs to enter house).      Cammie Mcgee, PT, DPT # [id number] Physical Therapist - [place]  [facility] [place]  5:57 PM,08/31/22

## 2022-09-05 ENCOUNTER — Encounter: Payer: Self-pay | Admitting: Physical Therapy

## 2022-09-05 ENCOUNTER — Ambulatory Visit: Payer: Medicare Other | Attending: Internal Medicine | Admitting: Physical Therapy

## 2022-09-05 DIAGNOSIS — Z89611 Acquired absence of right leg above knee: Secondary | ICD-10-CM

## 2022-09-05 DIAGNOSIS — R293 Abnormal posture: Secondary | ICD-10-CM | POA: Diagnosis present

## 2022-09-05 DIAGNOSIS — R269 Unspecified abnormalities of gait and mobility: Secondary | ICD-10-CM

## 2022-09-05 DIAGNOSIS — M6281 Muscle weakness (generalized): Secondary | ICD-10-CM

## 2022-09-05 NOTE — Therapy (Signed)
OUTPATIENT PHYSICAL THERAPY LOWER EXTREMITY TREATMENT   Patient Name: Jasmine Morrow MRN: [id number] DOB:10-23-1945, 77 y.o., female Today's Date: 09/05/2022    PT End of Session - 09/05/22 1503     Visit Number 75    Number of Visits 78    Date for PT Re-Evaluation 09/06/22    PT Start Time 1018    PT Stop Time 1119    PT Time Calculation (min) 61 min    Activity Tolerance Patient tolerated treatment well    Behavior During Therapy WFL for tasks assessed/performed             Past Medical History:  Diagnosis Date   Anemia    Anxiety    Asthma    seasonal    GERD (gastroesophageal reflux disease)    H/O above knee amputation, 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   Hypertension    PONV (postoperative nausea and vomiting)    Seasonal allergies    Sleep apnea    does not uses CPAP machine anymore    Wheelchair dependent    able to self transfer   Past Surgical History:  Procedure Laterality Date   APPENDECTOMY     CATARACT EXTRACTION W/PHACO Right 02/28/2022   Procedure: CATARACT EXTRACTION PHACO AND INTRAOCULAR LENS PLACEMENT (IOC) RIGHT  4.82  00:30.9;  Surgeon: Jasmine Crane, MD;  Location: [facility];  Service: Ophthalmology;  Laterality: Right;  sleep apnea   CATARACT EXTRACTION W/PHACO Left 03/14/2022   Procedure: CATARACT EXTRACTION PHACO AND INTRAOCULAR LENS PLACEMENT (IOC) LEFT;  Surgeon: Jasmine Crane, MD;  Location: [facility];  Service: Ophthalmology;  Laterality: Left;  4.32 0:39.7   CESAREAN SECTION     CHOLECYSTECTOMY     DILATION AND CURETTAGE OF UTERUS     ESOPHAGOGASTRODUODENOSCOPY N/A 02/22/2019   Procedure: ESOPHAGOGASTRODUODENOSCOPY (EGD);  Surgeon: Toledo, Boykin Nearing, MD;  Location: [facility];  Service: Gastroenterology;  Laterality: N/A;   FLEXIBLE SIGMOIDOSCOPY N/A 02/22/2019   Procedure: FLEXIBLE SIGMOIDOSCOPY;  Surgeon: Toledo, Boykin Nearing, MD;  Location: [facility];  Service: Gastroenterology;  Laterality: N/A;   HARDWARE  REMOVAL Right 11/12/2019   Procedure: Right ankle hardware removal;  Surgeon: Jasmine Bucker, MD;  Location: [facility];  Service: Orthopedics;  Laterality: Right;   HARDWARE REMOVAL Right 04/17/2020   Procedure: HARDWARE REMOVAL FROM RIGHT ANKLE AND RIGHT KNEE; IMPLANT OF CEMENT SPACERS IN RIGHT KNEE;  Surgeon: Jasmine Heinz, MD;  Location: [facility];  Service: Orthopedics;  Laterality: Right;   JOINT REPLACEMENT     LEG AMPUTATION ABOVE KNEE Right 12/02/2020   [facility]   ORIF ANKLE FRACTURE Right 03/14/2019   Procedure: OPEN REDUCTION INTERNAL FIXATION (ORIF) ANKLE FRACTURE, MEDIAL MALLEOLUS;  Surgeon: Jasmine Bucker, MD;  Location: [facility];  Service: Orthopedics;  Laterality: Right;   ORIF ANKLE FRACTURE Right 02/08/2019   Procedure: OPEN REDUCTION INTERNAL FIXATION (ORIF) ANKLE FRACTURE, post malleolus;  Surgeon: Jasmine Bucker, MD;  Location: [facility];  Service: Orthopedics;  Laterality: Right;   PARTIAL HYSTERECTOMY  1993   REPLACEMENT TOTAL KNEE Bilateral 2008   2009   SCAR DEBRIDEMENT OF TOTAL KNEE  01/2020   SYNDESMOSIS REPAIR Right 02/08/2019   Procedure: SYNDESMOSIS REPAIR;  Surgeon: Jasmine Bucker, MD;  Location: [facility];  Service: Orthopedics;  Laterality: Right;   TEE WITHOUT CARDIOVERSION N/A 02/26/2019   Procedure: TRANSESOPHAGEAL ECHOCARDIOGRAM (TEE);  Surgeon: Jasmine Blinks, MD;  Location: [facility];  Service: Cardiovascular;  Laterality: N/A;   TOTAL KNEE REVISION WITH SCAR DEBRIDEMENT/PATELLA REVISION  WITH POLY EXCHANGE Right 01/24/2020   Procedure: TOTAL KNEE REVISION WITH SCAR DEBRIDEMENT/PATELLA REVISION WITH POLY EXCHANGE;  Surgeon: Jasmine Heinz, MD;  Location: [facility];  Service: Orthopedics;  Laterality: Right;   TUMOR REMOVAL     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   Patient Active Problem List   Diagnosis Date Noted   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   Anxiety 04/17/2020   History of GI diverticular bleed 04/17/2020   Chest pain 04/17/2020    PAD (peripheral artery disease) (HCC) 04/05/2020   Ulcer of right leg (HCC) 04/05/2020   COPD (chronic obstructive pulmonary disease) (HCC) 04/05/2020   GERD (gastroesophageal reflux disease) 04/05/2020   Abscess of right knee 01/22/2020   Pressure injury of skin 02/28/2019   SOB (shortness of breath)    Gastrointestinal hemorrhage    Sepsis (HCC)    Symptomatic anemia 02/20/2019   Trimalleolar fracture of ankle, closed, right, initial encounter 02/07/2019   Multiple lung nodules 03/31/2014   Extrinsic asthma 03/27/2014   OSA on CPAP 03/27/2014    PCP: Jasmine Penton, MD  REFERRING PROVIDER: Danella Penton, MD  REFERRING DIAG: Above knee amputation of right lower extremity  THERAPY DIAG:  Gait difficulty  S/P AKA (above knee amputation) unilateral, right (HCC)  Muscle weakness (generalized)  Abnormal posture  Rationale for Evaluation and Treatment Rehabilitation  ONSET DATE: 12/02/20  SUBJECTIVE:  EVALUATION  PERTINENT HISTORY: Patient Profile:   Jasmine Morrow is a 77 y.o. female Chief Complaint  Patient presents with  Visit Follow Up  Doing well.   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 Cardiac cath 10/2011 showed completely normal coronary arteries, however she did have mild left ventricular dysfunction with anterior wall motion abnormality. Left ventricular EF was 51%.   History of chickenpox  Hyperlipidemia  patient states she has never been diagnosed with this  Hypertension  Knee joint replacement by other means  Macular degeneration  Major depressive disorder, recurrent, mild (CMS-HCC)  Medicare annual wellness visit, initial 08/09/2019  7/21  Multiple thyroid nodules  Normal coronary arteries 09/06/2013  Normal coronary anatomy by cardiac catheterization 10/12/11  Osteoarthritis  Ovarian cyst  PONV (postoperative nausea and vomiting)  nausea  Postoperative urinary retention 12/08/2020  Rash on lips 12/21/2020  Seasonal allergies  Sinusitis, unspecified  Sleep apnea  On CPAP.  Slow transit constipation 12/10/2020  Trimalleolar fracture of ankle, closed, right, initial encounter 02/07/2019  Complicated by staph infection 1/21, 4 weeks IV Ancef, Menz  Ulcer   Past Surgical History:  Procedure Laterality Date  SALPINGO OOPHORECTOMY Bilateral 1993  Right total knee arthroplasty 10/11/2006  Dr Ernest Pine  Left total knee arthroplasty 10/12/2007  Dr Ernest Pine  COLONOSCOPY 07/30/2012  internal hemorrhoids, diverticulosis  ANKLE ARTHODESIS W/ ARTHROSCOPY Right 02/08/2019  03/14/2019 second surgery  COLONOSCOPY 02/22/2019  Blood in entire colon/Diverticulosis - Presumed diverticular bleed. No repeat recommended per TKT.  EGD 02/22/2019  Gastritis/gastric ulcers/Hiatal hernia/Otherwise normal - no repeat  recommended per TKT.  ORIF ANKLE FRACTURE Right 03/14/2019  Dr. Rosita Kea  REMOVAL HARDWARE ANKLE FOOT/TOES Right 11/12/2019  Dr. Rosita Kea  Right knee arthrotomy, irrigation and debridement of the right knee, polyethylene exchange 01/24/2020  Dr Ernest Pine  Removal of hardware (plates and screws) from the right ankle with irrigation, debridement, and placement of Stimulan antibiotic beads 04/17/2020  Dr Ernest Pine  Right knee arthrotomy, extensive irrigation debridement, removal of right total knee implants, and placement of antibiotic impregnated  polymethylmethacrylate cement spacer 04/17/2020  Dr Ernest Pine  AMPUTATION LEG ABOVE KNEE AKA Right 12/02/2020  Procedure: AMPUTATION, THIGH, THROUGH FEMUR, ANY LEVEL; Surgeon: Starr Lake, MD; Location: [facility]; Service: Orthopedics; Laterality: Right;  TRANSFER ADJACENT TISSUE LEG Right 12/02/2020  Procedure: ADJACENT TISSUE TRANSFER OR REARRANGEMENT, LEG; DEFECT 10 SQ CM OR LESS; Surgeon: Starr Lake, MD; Location: [facility]; Service: Orthopedics; Laterality: Right;  ASPIRATION/INJECTION MAJOR JOINT/BURSA KNEE Left 12/02/2020  Procedure: ARTHROCENTESIS, ASPIRATION AND/OR INJECTION, MAJOR JOINT OR BURSA, KNEE; WITHOUT ULTRASOUND GUIDANCE; Surgeon: Starr Lake, MD; Location: [facility]; Service: Orthopedics; Laterality: Left;  REMOVAL KNEE PROSTHESIS W/POSSIBLE PLACEMENT SPACER Left 01/18/2021  Procedure: REMOVAL OF PROSTHESIS, INCLUDING TOTAL KNEE PROSTHESIS, METHYLMETHACRYLATE WITH OR WITHOUT INSERTION OF SPACER, KNEE; Surgeon: Celine Mans, MD; Location: [facility]; Service: Orthopedics; Laterality: Left;  INSERTION NON-BIODEGRADABLE DRUG DELIVERY IMPLANT Left 01/18/2021  Procedure: INSERTION, KNEE, bioresorbable, biodegradable, NON-BIODEGRADABLE DRUG DELIVERY IMPLANT; Surgeon: Celine Mans, MD; Location: [facility]; Service: Orthopedics; Laterality: Left;  ABOVE KNEE LEG AMPUTATION  12/02/2020  APPENDECTOMY Est 1968  CESAREAN SECTION  CHOLECYSTECTOMY  FRACTURE SURGERY 1/21  HYSTERECTOMY partial  JOINT REPLACEMENT 2008 & 2009  TUBAL LIGATION 3/79   ALLERGIES: Allergies  Allergen Reactions  Singulair [Montelukast] Other (See Comments)  Gland swelling  Sulfa (Sulfonamide Antibiotics) Swelling  Oxycodone Dizziness and Other (See Comments)  Vancomycin Analogues Other (See Comments)  Ringing in ears  Avinza [Morphine] Itching  Darvocet A500 [Propoxyphene N-Acetaminophen] Nausea  Nickel Rash  Ultracet  [Tramadol-Acetaminophen] Rash  Vicodin [Hydrocodone-Acetaminophen] Vomiting  Vioxx [Rofecoxib] Other (See Comments)  GI   CURRENT MEDICATIONS: Current Outpatient Medications: acetaminophen (TYLENOL) 500 MG tablet, Take 1,000 mg by mouth 3 (three) times a day Twice daily per patient., PRN Not Currently Taking aspirin 81 MG EC tablet, Take 1 tablet (81 mg total) by mouth at bedtime, Taking calcium carbonate-vitamin D3 (CALTRATE 600+D) 600 mg-10 mcg (400 unit) tablet, Take 0.5 tablets by mouth 2 (two) times daily, Taking cetirizine (ZYRTEC) 10 mg capsule, Take 1 capsule (10 mg total) by mouth once daily, Taking escitalopram oxalate (LEXAPRO) 10 MG tablet, Take 1 tablet (10 mg total) by mouth once daily, Taking estradioL (ESTRACE) 0.01 % (0.1 mg/gram) vaginal cream, Place 2 g vaginally twice a week, Taking folic acid/multivit,iron,miner (MULTIVIT-IRON-MIN-FOLIC ACID ORAL), Take 1 tablet by mouth once daily, Taking gabapentin (NEURONTIN) 300 MG capsule, Take 1 capsule (300 mg total) by mouth 3 (three) times daily Phantom limb pain, Taking melatonin 3 mg tablet, Take 1 tablet (3 mg total) by mouth at bedtime, Taking multivitamin with iron (COMPLETE MULTIVITAMIN-MINERAL) tablet, Take 1 tablet by mouth, Taking multivitamin with minerals, EYE, (PRESERVISION AREDS 2) soft gel capsule, Take 1 capsule by mouth 2 (two) times daily, Taking nystatin (MYCOSTATIN) 100,000 unit/gram powder, Apply small amount topically twice daily for rash under breasts, PRN Not Currently Taking pantoprazole (PROTONIX) 40 MG DR tablet, Take 1 tablet (40 mg total) by mouth 2 (two) times daily. Please call to schedule an appt. Thanks!, Taking polyethylene  glycol (MIRALAX) powder, Take 17 g by mouth once daily Mix in 4-8ounces of fluid prior to taking., Taking sennosides-docusate (SENOKOT-S) 8.6-50 mg tablet, Take 1 tablet by mouth 2 (two) times daily For constipation, Taking lactose-reduced food (ENSURE PLUS ORAL), Take 237 mLs by  mouth 2 (two) times daily With breakfast and dinner  HPI   CLINICAL SUMMARY:  Patient post right AKA and working with prosthesis. 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 Able to do housework, improving  PAIN:  Are you having pain? Yes: NPRS scale: 5/10 Pain location: L knee Pain description: aching Aggravating factors: walking Relieving factors: rest  PRECAUTIONS: None  WEIGHT BEARING RESTRICTIONS No  FALLS:  Has patient fallen in last 6 months? No  LIVING ENVIRONMENT: Lives with: lives with their spouse Lives in: House/apartment Stairs: No Has following equipment at home: Dan Humphreys - 4 wheeled and Wheelchair (manual)  OCCUPATION: retired  PLOF: Requires assistive device for independence  PATIENT GOALS  improve standing tolerance/ pull up pants/ ambulate with SPC safely.    OBJECTIVE:   PATIENT SURVEYS:  FOTO initial 46/ goal 38.   11/2: 50  COGNITION:  Overall cognitive status: Within functional limits for tasks assessed     SENSATION: WFL  POSTURE: rounded shoulders, forward head, flexed trunk , and weight shift left  PALPATION: No tenderness along R distal residual limb.  Discussed phantom limb sensation.    LOWER EXTREMITY ROM:  B LE AROM WFL except L knee extension secondary to articulating spacer.  Did not assess L/R hip extension at this time.    LOWER EXTREMITY MMT:  MMT Right eval Left eval  Hip flexion 4/5 4/5  Hip extension    Hip abduction 4+/5 4+/5  Hip adduction 4+/5 4+/5  Hip internal rotation    Hip external rotation    Knee flexion N/A 4+/5  Knee extension N/A 4+/5  Ankle dorsiflexion  5/5  Ankle plantarflexion    Ankle inversion    Ankle eversion     (Blank rows = not tested)  GAIT: Distance walked: in gym/ //-bars Assistive device utilized: Environmental consultant - 2 wheeled Level of assistance: CGA Comments: Cuing for posture correction with mirror feedback.  Flexed posture/ heavy UE assist.    TUG: UE assist  required to stand/ use of RW.  51.9 seconds.     TODAY'S TREATMENT:  09/05/2022  Subjective: Pt. Arrived to PT with husband and donned prosthetic leg on blue mat table.  Pt. Focused on improving standing/ walking/ stair climbing endurance and independence.  Pt. Has not worn prosthetic leg over the weekend.  Pt. Attended church in w/c and performed a solo in choir.    Objective:   There.ex.:  No charge   Nustep L5 10+ mins (0.5 miles) B UE/LE (consistent cadence/ no residual limb pain or tenderness reported).  Discussed weekend activities/ fit of prosthetic leg.                Gait training:    Amb. From blue mat table to Nustep with use of RW and focus on step through gait.  SBA/CGA on R to ensure R knee mechanism locks out before wt. Bearing.  No verbal cuing required and consistent gait pattern/ step pattern.  PT adjusted/tightened fit several times.       Stair training with B handrails forward on ascent and descent.  Step to gait pattern with heavy UE assist (4 steps x 3)- seated rest break required due to fatigue/shoulder  discomfort.    Walking in //-bars forward/backwards/ lateral 2 laps each with heavy UE assist and cuing to correct upright posture with mirror feedback.  Good step length/ heel strike.    Neuro. Mm.:     Standing tolerance at //-bars.   Pt. Occasionally requires UE assist on //-bars to prevent LOB.  Seated/standing posture correction required.         PATIENT EDUCATION:  Education details: HEP/ gait and prosthetic training.  Person educated: Patient and Spouse Education method: Explanation, Demonstration, and Verbal cues Education comprehension: verbalized understanding, returned demonstration, and tactile cues required   HOME EXERCISE PROGRAM: Prone position hip stretches/ Standing marching at kitchen counter with w/c behind patient.      ASSESSMENT:  CLINICAL IMPRESSION:    Patient presents to PT in w/c with prosthetic leg in hand. Highly motivated to  become more independent with use of RW as well as introducing use of rollator for mobility. Continues to ambulate with good BOS/step pattern with no episodes of R knee mechanism buckling this session. Session focused on stair training and upright posture with gain in //-bars/RW.  Pt. Remains motivated to be able to ascend/descends stairs at beach in <2 months. No residual limb discomfort today.  Patient will continue to benefit from skilled PT services to improve standing tolerance/ gait independence and ADL.  OBJECTIVE IMPAIRMENTS Abnormal gait, decreased activity tolerance, decreased balance, decreased endurance, decreased mobility, difficulty walking, decreased ROM, decreased strength, decreased safety awareness, impaired flexibility, improper body mechanics, prosthetic dependency , and pain.   ACTIVITY LIMITATIONS carrying, lifting, standing, squatting, stairs, transfers, bed mobility, bathing, toileting, dressing, hygiene/grooming, and locomotion level  PARTICIPATION LIMITATIONS: cleaning, laundry, driving, shopping, community activity, and yard work  PERSONAL FACTORS Past/current experiences are also affecting patient's functional outcome.   REHAB POTENTIAL: Good  CLINICAL DECISION MAKING: Evolving/moderate complexity  EVALUATION COMPLEXITY: High   GOALS: Goals reviewed with patient Yes  LONG TERM GOALS: Target date: 09/06/22  Pt. Will increase FOTO to 54 to improve functional mobility.  Baseline: initial FOTO 46.  11/2: 50.  4/25: 60  Goal status: Goal met  2.  Pt. Able to tolerate standing 10 minutes while pulling up pants with mod. I safely while wearing prosthetic leg to improve daily activities.  Baseline:  limited standing tolerance Goal status: Partially met  3.  Pt. Will ambulate 200 feet with use of least assistive device and mod. I to promote safety with household tasks/ walking into bathroom.   Baseline:  amb. With RW and min. A for safety.  2/27: walking in clinic with  SBA/mod. I for 100 feet with RW and use of prosthetic leg.  6/11: amb. With RW or rollator <100 feet with SBA for RW/ CGA for rollator. 7/18: 120' with SBA with RW/CGA for rollator Goal status: Ongoing  4.  Pt. Will ascend 10 stairs with use of single forearm crutch/ handrail and step to pattern with mod. I safely.  Baseline:  see above; 7/18: ascends 4 stairs with step to pattern, with B rails, and CGA Goal status: Ongoing  PLAN: PT FREQUENCY: 2x/week  PT DURATION: 8 weeks  PLANNED INTERVENTIONS: Therapeutic exercises, Therapeutic activity, Neuromuscular re-education, Balance training, Gait training, Patient/Family education, Self Care, Joint mobilization, Prosthetic training, DME instructions, and Manual therapy  PLAN FOR NEXT SESSION:  Progress gait/ standing tolerance.  F/u on upright rollator.  FOCUS ON STAIRS to prepare for beach trip in <2 months (14 stairs to enter house).      Casimiro Needle  Fuller Song, PT, DPT # [phone] Physical Therapist - [place]  [facility]  3:30 PM,09/05/22

## 2022-09-07 ENCOUNTER — Ambulatory Visit: Payer: Medicare Other | Admitting: Physical Therapy

## 2022-09-07 DIAGNOSIS — R293 Abnormal posture: Secondary | ICD-10-CM

## 2022-09-07 DIAGNOSIS — M6281 Muscle weakness (generalized): Secondary | ICD-10-CM

## 2022-09-07 DIAGNOSIS — Z89611 Acquired absence of right leg above knee: Secondary | ICD-10-CM

## 2022-09-07 DIAGNOSIS — R269 Unspecified abnormalities of gait and mobility: Secondary | ICD-10-CM

## 2022-09-07 NOTE — Therapy (Signed)
OUTPATIENT PHYSICAL THERAPY LOWER EXTREMITY TREATMENT   Patient Name: Jasmine Morrow MRN: [id number] DOB:[birth year], 77 y.o., female Today's Date: 09/07/2022    PT End of Session - 09/07/22 1434     Visit Number 76    Number of Visits 92    Date for PT Re-Evaluation 11/02/22    PT Start Time 1014    PT Stop Time 1109    PT Time Calculation (min) 55 min    Activity Tolerance Patient tolerated treatment well    Behavior During Therapy WFL for tasks assessed/performed             Past Medical History:  Diagnosis Date   Anemia    Anxiety    Asthma    seasonal    GERD (gastroesophageal reflux disease)    H/O above knee amputation, 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   Hypertension    PONV (postoperative nausea and vomiting)    Seasonal allergies    Sleep apnea    does not uses CPAP machine anymore    Wheelchair dependent    able to self transfer   Past Surgical History:  Procedure Laterality Date   APPENDECTOMY     CATARACT EXTRACTION W/PHACO Right 02/28/2022   Procedure: CATARACT EXTRACTION PHACO AND INTRAOCULAR LENS PLACEMENT (IOC) RIGHT  4.82  00:30.9;  Surgeon: Nevada Crane, MD;  Location: [facility];  Service: Ophthalmology;  Laterality: Right;  sleep apnea   CATARACT EXTRACTION W/PHACO Left 03/14/2022   Procedure: CATARACT EXTRACTION PHACO AND INTRAOCULAR LENS PLACEMENT (IOC) LEFT;  Surgeon: Nevada Crane, MD;  Location: [facility];  Service: Ophthalmology;  Laterality: Left;  4.32 0:39.7   CESAREAN SECTION     CHOLECYSTECTOMY     DILATION AND CURETTAGE OF UTERUS     ESOPHAGOGASTRODUODENOSCOPY N/A 02/22/2019   Procedure: ESOPHAGOGASTRODUODENOSCOPY (EGD);  Surgeon: Toledo, Boykin Nearing, MD;  Location: [facility];  Service: Gastroenterology;  Laterality: N/A;   FLEXIBLE SIGMOIDOSCOPY N/A 02/22/2019   Procedure: FLEXIBLE SIGMOIDOSCOPY;  Surgeon: Toledo, Boykin Nearing, MD;  Location: [facility];  Service: Gastroenterology;  Laterality: N/A;   HARDWARE  REMOVAL Right 11/12/2019   Procedure: Right ankle hardware removal;  Surgeon: Kennedy Bucker, MD;  Location: [facility];  Service: Orthopedics;  Laterality: Right;   HARDWARE REMOVAL Right 04/17/2020   Procedure: HARDWARE REMOVAL FROM RIGHT ANKLE AND RIGHT KNEE; IMPLANT OF CEMENT SPACERS IN RIGHT KNEE;  Surgeon: Donato Heinz, MD;  Location: [facility];  Service: Orthopedics;  Laterality: Right;   JOINT REPLACEMENT     LEG AMPUTATION ABOVE KNEE Right 12/02/2020   [facility]   ORIF ANKLE FRACTURE Right 03/14/2019   Procedure: OPEN REDUCTION INTERNAL FIXATION (ORIF) ANKLE FRACTURE, MEDIAL MALLEOLUS;  Surgeon: Kennedy Bucker, MD;  Location: [facility];  Service: Orthopedics;  Laterality: Right;   ORIF ANKLE FRACTURE Right 02/08/2019   Procedure: OPEN REDUCTION INTERNAL FIXATION (ORIF) ANKLE FRACTURE, post malleolus;  Surgeon: Kennedy Bucker, MD;  Location: [facility];  Service: Orthopedics;  Laterality: Right;   PARTIAL HYSTERECTOMY  1993   REPLACEMENT TOTAL KNEE Bilateral 2008   2009   SCAR DEBRIDEMENT OF TOTAL KNEE  01/2020   SYNDESMOSIS REPAIR Right 02/08/2019   Procedure: SYNDESMOSIS REPAIR;  Surgeon: Kennedy Bucker, MD;  Location: [facility];  Service: Orthopedics;  Laterality: Right;   TEE WITHOUT CARDIOVERSION N/A 02/26/2019   Procedure: TRANSESOPHAGEAL ECHOCARDIOGRAM (TEE);  Surgeon: Lamar Blinks, MD;  Location: [facility];  Service: Cardiovascular;  Laterality: N/A;   TOTAL KNEE REVISION WITH SCAR DEBRIDEMENT/PATELLA REVISION  WITH POLY EXCHANGE Right 01/24/2020   Procedure: TOTAL KNEE REVISION WITH SCAR DEBRIDEMENT/PATELLA REVISION WITH POLY EXCHANGE;  Surgeon: Donato Heinz, MD;  Location: [facility];  Service: Orthopedics;  Laterality: Right;   TUMOR REMOVAL     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   Patient Active Problem List   Diagnosis Date Noted   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   Anxiety 04/17/2020   History of GI diverticular bleed 04/17/2020   Chest pain 04/17/2020    PAD (peripheral artery disease) (HCC) 04/05/2020   Ulcer of right leg (HCC) 04/05/2020   COPD (chronic obstructive pulmonary disease) (HCC) 04/05/2020   GERD (gastroesophageal reflux disease) 04/05/2020   Abscess of right knee 01/22/2020   Pressure injury of skin 02/28/2019   SOB (shortness of breath)    Gastrointestinal hemorrhage    Sepsis (HCC)    Symptomatic anemia 02/20/2019   Trimalleolar fracture of ankle, closed, right, initial encounter 02/07/2019   Multiple lung nodules 03/31/2014   Extrinsic asthma 03/27/2014   OSA on CPAP 03/27/2014    PCP: Danella Penton, MD  REFERRING PROVIDER: Danella Penton, MD  REFERRING DIAG: Above knee amputation of right lower extremity  THERAPY DIAG:  Gait difficulty  S/P AKA (above knee amputation) unilateral, right (HCC)  Muscle weakness (generalized)  Abnormal posture  Rationale for Evaluation and Treatment Rehabilitation  ONSET DATE: 12/02/20  SUBJECTIVE:  EVALUATION  PERTINENT HISTORY: Patient Profile:   Jasmine Morrow is a 77 y.o. female Chief Complaint  Patient presents with  Visit Follow Up  Doing well.   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 Cardiac cath 10/2011 showed completely normal coronary arteries, however she did have mild left ventricular dysfunction with anterior wall motion abnormality. Left ventricular EF was 51%.   History of chickenpox  Hyperlipidemia  patient states she has never been diagnosed with this  Hypertension  Knee joint replacement by other means  Macular degeneration  Major depressive disorder, recurrent, mild (CMS-HCC)  Medicare annual wellness visit, initial 08/09/2019  7/21  Multiple thyroid nodules  Normal coronary arteries 09/06/2013  Normal coronary anatomy by cardiac catheterization 10/12/11  Osteoarthritis  Ovarian cyst  PONV (postoperative nausea and vomiting)  nausea  Postoperative urinary retention 12/08/2020  Rash on lips 12/21/2020  Seasonal allergies  Sinusitis, unspecified  Sleep apnea  On CPAP.  Slow transit constipation 12/10/2020  Trimalleolar fracture of ankle, closed, right, initial encounter 02/07/2019  Complicated by staph infection 1/21, 4 weeks IV Ancef, Menz  Ulcer   Past Surgical History:  Procedure Laterality Date  SALPINGO OOPHORECTOMY Bilateral 1993  Right total knee arthroplasty 10/11/2006  Dr Ernest Pine  Left total knee arthroplasty 10/12/2007  Dr Ernest Pine  COLONOSCOPY 07/30/2012  internal hemorrhoids, diverticulosis  ANKLE ARTHODESIS W/ ARTHROSCOPY Right 02/08/2019  03/14/2019 second surgery  COLONOSCOPY 02/22/2019  Blood in entire colon/Diverticulosis - Presumed diverticular bleed. No repeat recommended per TKT.  EGD 02/22/2019  Gastritis/gastric ulcers/Hiatal hernia/Otherwise normal - no repeat  recommended per TKT.  ORIF ANKLE FRACTURE Right 03/14/2019  Dr. Rosita Kea  REMOVAL HARDWARE ANKLE FOOT/TOES Right 11/12/2019  Dr. Rosita Kea  Right knee arthrotomy, irrigation and debridement of the right knee, polyethylene exchange 01/24/2020  Dr Ernest Pine  Removal of hardware (plates and screws) from the right ankle with irrigation, debridement, and placement of Stimulan antibiotic beads 04/17/2020  Dr Ernest Pine  Right knee arthrotomy, extensive irrigation debridement, removal of right total knee implants, and placement of antibiotic impregnated  polymethylmethacrylate cement spacer 04/17/2020  Dr Ernest Pine  AMPUTATION LEG ABOVE KNEE AKA Right 12/02/2020  Procedure: AMPUTATION, THIGH, THROUGH FEMUR, ANY LEVEL; Surgeon: Starr Lake, MD; Location: [facility]; Service: Orthopedics; Laterality: Right;  TRANSFER ADJACENT TISSUE LEG Right 12/02/2020  Procedure: ADJACENT TISSUE TRANSFER OR REARRANGEMENT, LEG; DEFECT 10 SQ CM OR LESS; Surgeon: Starr Lake, MD; Location: [facility]; Service: Orthopedics; Laterality: Right;  ASPIRATION/INJECTION MAJOR JOINT/BURSA KNEE Left 12/02/2020  Procedure: ARTHROCENTESIS, ASPIRATION AND/OR INJECTION, MAJOR JOINT OR BURSA, KNEE; WITHOUT ULTRASOUND GUIDANCE; Surgeon: Starr Lake, MD; Location: [facility]; Service: Orthopedics; Laterality: Left;  REMOVAL KNEE PROSTHESIS W/POSSIBLE PLACEMENT SPACER Left 01/18/2021  Procedure: REMOVAL OF PROSTHESIS, INCLUDING TOTAL KNEE PROSTHESIS, METHYLMETHACRYLATE WITH OR WITHOUT INSERTION OF SPACER, KNEE; Surgeon: Celine Mans, MD; Location: [facility]; Service: Orthopedics; Laterality: Left;  INSERTION NON-BIODEGRADABLE DRUG DELIVERY IMPLANT Left 01/18/2021  Procedure: INSERTION, KNEE, bioresorbable, biodegradable, NON-BIODEGRADABLE DRUG DELIVERY IMPLANT; Surgeon: Celine Mans, MD; Location: [facility]; Service: Orthopedics; Laterality: Left;  ABOVE KNEE LEG AMPUTATION  12/02/2020  APPENDECTOMY Est 1968  CESAREAN SECTION  CHOLECYSTECTOMY  FRACTURE SURGERY 1/21  HYSTERECTOMY partial  JOINT REPLACEMENT 2008 & 2009  TUBAL LIGATION 3/79   ALLERGIES: Allergies  Allergen Reactions  Singulair [Montelukast] Other (See Comments)  Gland swelling  Sulfa (Sulfonamide Antibiotics) Swelling  Oxycodone Dizziness and Other (See Comments)  Vancomycin Analogues Other (See Comments)  Ringing in ears  Avinza [Morphine] Itching  Darvocet A500 [Propoxyphene N-Acetaminophen] Nausea  Nickel Rash  Ultracet  [Tramadol-Acetaminophen] Rash  Vicodin [Hydrocodone-Acetaminophen] Vomiting  Vioxx [Rofecoxib] Other (See Comments)  GI   CURRENT MEDICATIONS: Current Outpatient Medications: acetaminophen (TYLENOL) 500 MG tablet, Take 1,000 mg by mouth 3 (three) times a day Twice daily per patient., PRN Not Currently Taking aspirin 81 MG EC tablet, Take 1 tablet (81 mg total) by mouth at bedtime, Taking calcium carbonate-vitamin D3 (CALTRATE 600+D) 600 mg-10 mcg (400 unit) tablet, Take 0.5 tablets by mouth 2 (two) times daily, Taking cetirizine (ZYRTEC) 10 mg capsule, Take 1 capsule (10 mg total) by mouth once daily, Taking escitalopram oxalate (LEXAPRO) 10 MG tablet, Take 1 tablet (10 mg total) by mouth once daily, Taking estradioL (ESTRACE) 0.01 % (0.1 mg/gram) vaginal cream, Place 2 g vaginally twice a week, Taking folic acid/multivit,iron,miner (MULTIVIT-IRON-MIN-FOLIC ACID ORAL), Take 1 tablet by mouth once daily, Taking gabapentin (NEURONTIN) 300 MG capsule, Take 1 capsule (300 mg total) by mouth 3 (three) times daily Phantom limb pain, Taking melatonin 3 mg tablet, Take 1 tablet (3 mg total) by mouth at bedtime, Taking multivitamin with iron (COMPLETE MULTIVITAMIN-MINERAL) tablet, Take 1 tablet by mouth, Taking multivitamin with minerals, EYE, (PRESERVISION AREDS 2) soft gel capsule, Take 1 capsule by mouth 2 (two) times daily, Taking nystatin (MYCOSTATIN) 100,000 unit/gram powder, Apply small amount topically twice daily for rash under breasts, PRN Not Currently Taking pantoprazole (PROTONIX) 40 MG DR tablet, Take 1 tablet (40 mg total) by mouth 2 (two) times daily. Please call to schedule an appt. Thanks!, Taking polyethylene  glycol (MIRALAX) powder, Take 17 g by mouth once daily Mix in 4-8ounces of fluid prior to taking., Taking sennosides-docusate (SENOKOT-S) 8.6-50 mg tablet, Take 1 tablet by mouth 2 (two) times daily For constipation, Taking lactose-reduced food (ENSURE PLUS ORAL), Take 237 mLs by  mouth 2 (two) times daily With breakfast and dinner  HPI   CLINICAL SUMMARY:  Patient post right AKA and working with prosthesis. 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 Able to do housework, improving  PAIN:  Are you having pain? Yes: NPRS scale: 5/10 Pain location: L knee Pain description: aching Aggravating factors: walking Relieving factors: rest  PRECAUTIONS: None  WEIGHT BEARING RESTRICTIONS No  FALLS:  Has patient fallen in last 6 months? No  LIVING ENVIRONMENT: Lives with: lives with their spouse Lives in: House/apartment Stairs: No Has following equipment at home: Dan Humphreys - 4 wheeled and Wheelchair (manual)  OCCUPATION: retired  PLOF: Requires assistive device for independence  PATIENT GOALS  improve standing tolerance/ pull up pants/ ambulate with SPC safely.    OBJECTIVE:   PATIENT SURVEYS:  FOTO initial 46/ goal 47.   11/2: 50  COGNITION:  Overall cognitive status: Within functional limits for tasks assessed     SENSATION: WFL  POSTURE: rounded shoulders, forward head, flexed trunk , and weight shift left  PALPATION: No tenderness along R distal residual limb.  Discussed phantom limb sensation.    LOWER EXTREMITY ROM:  B LE AROM WFL except L knee extension secondary to articulating spacer.  Did not assess L/R hip extension at this time.    LOWER EXTREMITY MMT:  MMT Right eval Left eval  Hip flexion 4/5 4/5  Hip extension    Hip abduction 4+/5 4+/5  Hip adduction 4+/5 4+/5  Hip internal rotation    Hip external rotation    Knee flexion N/A 4+/5  Knee extension N/A 4+/5  Ankle dorsiflexion  5/5  Ankle plantarflexion    Ankle inversion    Ankle eversion     (Blank rows = not tested)  GAIT: Distance walked: in gym/ //-bars Assistive device utilized: Environmental consultant - 2 wheeled Level of assistance: CGA Comments: Cuing for posture correction with mirror feedback.  Flexed posture/ heavy UE assist.    TUG: UE assist  required to stand/ use of RW.  51.9 seconds.     TODAY'S TREATMENT:  09/07/2022  Subjective: Pt. Arrived to PT with husband and donned prosthetic leg on blue mat table.  Pt. Focused on improving standing/ walking/ stair climbing endurance and independence.  Pt. Has not worn prosthetic leg over the weekend.  Pt. Attended church in w/c and performed a solo in choir.    Objective:   There.ex.:  No charge   Nustep L5 10+ mins (0.5 miles) B UE/LE (consistent cadence/ no residual limb pain or tenderness reported).  Discussed weekend activities/ fit of prosthetic leg.                Gait training:    Amb. From blue mat table to Nustep with use of RW and focus on step through gait.  SBA/CGA on R to ensure R knee mechanism locks out before wt. Bearing.  No verbal cuing required and consistent gait pattern/ step pattern.  PT adjusted/tightened fit several times.       Stair training with B handrails forward on ascent and descent.  Step to gait pattern with heavy UE assist (4 steps x 3)- seated rest break required due to fatigue/shoulder  discomfort.  Pt. C/o "pinching" in thigh due to prosthetic leg.  Pt. Able to sit down in chair and remove prosthetic leg/ gel liner to assess.    Walking in //-bars forward/backwards/ lateral 2 laps each with heavy UE assist and cuing to correct upright posture with mirror feedback.  Good step length/ heel strike.    Walking from //-bars to end of hallway and then waiting room with use of RW and consistent recip. Gait pattern.  Pt. Requested to sit due to fatigue/ B shoulder discomfort.  No reports of "pinching" in prosthetic leg after readjustment of gel liner.      PATIENT EDUCATION:  Education details: HEP/ gait and prosthetic training.  Person educated: Patient and Spouse Education method: Explanation, Demonstration, and Verbal cues Education comprehension: verbalized understanding, returned demonstration, and tactile cues required   HOME EXERCISE  PROGRAM: Prone position hip stretches/ Standing marching at kitchen counter with w/c behind patient.      ASSESSMENT:  CLINICAL IMPRESSION:    Patient presents to PT in w/c with prosthetic leg in hand. Highly motivated to become more independent with use of RW as well as introducing use of rollator for mobility. Continues to ambulate with good BOS/step pattern with no episodes of R knee mechanism buckling this session. Session focused on stair training and upright posture with gain in //-bars/RW.  Pt. Remains motivated to be able to ascend/descends stairs at beach in <2 months. No residual limb discomfort today.  Patient will continue to benefit from skilled PT services to improve standing tolerance/ gait independence and ADL.  OBJECTIVE IMPAIRMENTS Abnormal gait, decreased activity tolerance, decreased balance, decreased endurance, decreased mobility, difficulty walking, decreased ROM, decreased strength, decreased safety awareness, impaired flexibility, improper body mechanics, prosthetic dependency , and pain.   ACTIVITY LIMITATIONS carrying, lifting, standing, squatting, stairs, transfers, bed mobility, bathing, toileting, dressing, hygiene/grooming, and locomotion level  PARTICIPATION LIMITATIONS: cleaning, laundry, driving, shopping, community activity, and yard work  PERSONAL FACTORS Past/current experiences are also affecting patient's functional outcome.   REHAB POTENTIAL: Good  CLINICAL DECISION MAKING: Evolving/moderate complexity  EVALUATION COMPLEXITY: High   GOALS: Goals reviewed with patient Yes  LONG TERM GOALS: Target date: 11/02/22  Pt. Will increase FOTO to 54 to improve functional mobility.  Baseline: initial FOTO 46.  11/2: 50.  4/25: 60  Goal status: Goal met  2.  Pt. Able to tolerate standing 10 minutes while pulling up pants with mod. I safely while wearing prosthetic leg to improve daily activities.  Baseline:  limited standing tolerance Goal status:  Partially met  3.  Pt. Will ambulate 200 feet with use of least assistive device and mod. I to promote safety with household tasks/ walking into bathroom.   Baseline:  amb. With RW and min. A for safety.  2/27: walking in clinic with SBA/mod. I for 100 feet with RW and use of prosthetic leg.  6/11: amb. With RW or rollator <100 feet with SBA for RW/ CGA for rollator. 7/18: 120' with SBA with RW/CGA for rollator Goal status: Ongoing  4.  Pt. Will ascend 10 stairs with use of single forearm crutch/ handrail and step to pattern with mod. I safely.  Baseline:  see above; 7/18: ascends 4 stairs with step to pattern, with B rails, and CGA Goal status: Ongoing  PLAN: PT FREQUENCY: 2x/week  PT DURATION: 8 weeks  PLANNED INTERVENTIONS: Therapeutic exercises, Therapeutic activity, Neuromuscular re-education, Balance training, Gait training, Patient/Family education, Self Care, Joint mobilization, Prosthetic training, DME instructions, and  Manual therapy  PLAN FOR NEXT SESSION:  Progress gait/ standing tolerance.  F/u on upright rollator.  FOCUS ON STAIRS to prepare for beach trip in October (14 stairs to enter house).      Cammie Mcgee, PT, DPT # [phone] Physical Therapist -   [facility]  2:42 PM,09/07/22

## 2022-09-12 ENCOUNTER — Encounter: Payer: Self-pay | Admitting: Physical Therapy

## 2022-09-12 ENCOUNTER — Ambulatory Visit: Payer: Medicare Other | Admitting: Physical Therapy

## 2022-09-12 DIAGNOSIS — R293 Abnormal posture: Secondary | ICD-10-CM

## 2022-09-12 DIAGNOSIS — M6281 Muscle weakness (generalized): Secondary | ICD-10-CM

## 2022-09-12 DIAGNOSIS — R269 Unspecified abnormalities of gait and mobility: Secondary | ICD-10-CM

## 2022-09-12 DIAGNOSIS — Z89611 Acquired absence of right leg above knee: Secondary | ICD-10-CM

## 2022-09-12 NOTE — Therapy (Signed)
OUTPATIENT PHYSICAL THERAPY LOWER EXTREMITY TREATMENT   Patient Name: Jasmine Morrow MRN: [id number] DOB:[birth year], 78 y.o., female Today's Date: 09/12/2022    PT End of Session - 09/12/22 1354     Visit Number 77    Number of Visits 92    Date for PT Re-Evaluation 11/02/22    PT Start Time 1016    PT Stop Time 1113    PT Time Calculation (min) 57 min    Activity Tolerance Patient tolerated treatment well    Behavior During Therapy WFL for tasks assessed/performed             Past Medical History:  Diagnosis Date   Anemia    Anxiety    Asthma    seasonal    GERD (gastroesophageal reflux disease)    H/O above knee amputation, 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   Hypertension    PONV (postoperative nausea and vomiting)    Seasonal allergies    Sleep apnea    does not uses CPAP machine anymore    Wheelchair dependent    able to self transfer   Past Surgical History:  Procedure Laterality Date   APPENDECTOMY     CATARACT EXTRACTION W/PHACO Right 02/28/2022   Procedure: CATARACT EXTRACTION PHACO AND INTRAOCULAR LENS PLACEMENT (IOC) RIGHT  4.82  00:30.9;  Surgeon: Nevada Crane, MD;  Location: [facility];  Service: Ophthalmology;  Laterality: Right;  sleep apnea   CATARACT EXTRACTION W/PHACO Left 03/14/2022   Procedure: CATARACT EXTRACTION PHACO AND INTRAOCULAR LENS PLACEMENT (IOC) LEFT;  Surgeon: Nevada Crane, MD;  Location: [facility];  Service: Ophthalmology;  Laterality: Left;  4.32 0:39.7   CESAREAN SECTION     CHOLECYSTECTOMY     DILATION AND CURETTAGE OF UTERUS     ESOPHAGOGASTRODUODENOSCOPY N/A 02/22/2019   Procedure: ESOPHAGOGASTRODUODENOSCOPY (EGD);  Surgeon: Toledo, Boykin Nearing, MD;  Location: [facility];  Service: Gastroenterology;  Laterality: N/A;   FLEXIBLE SIGMOIDOSCOPY N/A 02/22/2019   Procedure: FLEXIBLE SIGMOIDOSCOPY;  Surgeon: Toledo, Boykin Nearing, MD;  Location: [facility];  Service: Gastroenterology;  Laterality: N/A;    HARDWARE REMOVAL Right 11/12/2019   Procedure: Right ankle hardware removal;  Surgeon: Kennedy Bucker, MD;  Location: [facility];  Service: Orthopedics;  Laterality: Right;   HARDWARE REMOVAL Right 04/17/2020   Procedure: HARDWARE REMOVAL FROM RIGHT ANKLE AND RIGHT KNEE; IMPLANT OF CEMENT SPACERS IN RIGHT KNEE;  Surgeon: Donato Heinz, MD;  Location: [facility];  Service: Orthopedics;  Laterality: Right;   JOINT REPLACEMENT     LEG AMPUTATION ABOVE KNEE Right 12/02/2020   [facility]   ORIF ANKLE FRACTURE Right 03/14/2019   Procedure: OPEN REDUCTION INTERNAL FIXATION (ORIF) ANKLE FRACTURE, MEDIAL MALLEOLUS;  Surgeon: Kennedy Bucker, MD;  Location: [facility];  Service: Orthopedics;  Laterality: Right;   ORIF ANKLE FRACTURE Right 02/08/2019   Procedure: OPEN REDUCTION INTERNAL FIXATION (ORIF) ANKLE FRACTURE, post malleolus;  Surgeon: Kennedy Bucker, MD;  Location: [facility];  Service: Orthopedics;  Laterality: Right;   PARTIAL HYSTERECTOMY  1993   REPLACEMENT TOTAL KNEE Bilateral 2008   2009   SCAR DEBRIDEMENT OF TOTAL KNEE  01/2020   SYNDESMOSIS REPAIR Right 02/08/2019   Procedure: SYNDESMOSIS REPAIR;  Surgeon: Kennedy Bucker, MD;  Location: [facility];  Service: Orthopedics;  Laterality: Right;   TEE WITHOUT CARDIOVERSION N/A 02/26/2019   Procedure: TRANSESOPHAGEAL ECHOCARDIOGRAM (TEE);  Surgeon: Lamar Blinks, MD;  Location: [facility];  Service: Cardiovascular;  Laterality: N/A;   TOTAL KNEE REVISION WITH SCAR DEBRIDEMENT/PATELLA REVISION  WITH POLY EXCHANGE Right 01/24/2020   Procedure: TOTAL KNEE REVISION WITH SCAR DEBRIDEMENT/PATELLA REVISION WITH POLY EXCHANGE;  Surgeon: Donato Heinz, MD;  Location: [facility];  Service: Orthopedics;  Laterality: Right;   TUMOR REMOVAL     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   Patient Active Problem List   Diagnosis Date Noted   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   Anxiety 04/17/2020   History of GI diverticular bleed 04/17/2020   Chest pain  04/17/2020   PAD (peripheral artery disease) (HCC) 04/05/2020   Ulcer of right leg (HCC) 04/05/2020   COPD (chronic obstructive pulmonary disease) (HCC) 04/05/2020   GERD (gastroesophageal reflux disease) 04/05/2020   Abscess of right knee 01/22/2020   Pressure injury of skin 02/28/2019   SOB (shortness of breath)    Gastrointestinal hemorrhage    Sepsis (HCC)    Symptomatic anemia 02/20/2019   Trimalleolar fracture of ankle, closed, right, initial encounter 02/07/2019   Multiple lung nodules 03/31/2014   Extrinsic asthma 03/27/2014   OSA on CPAP 03/27/2014    PCP: Danella Penton, MD  REFERRING PROVIDER: Danella Penton, MD  REFERRING DIAG: Above knee amputation of right lower extremity  THERAPY DIAG:  Gait difficulty  S/P AKA (above knee amputation) unilateral, right (HCC)  Muscle weakness (generalized)  Abnormal posture  Rationale for Evaluation and Treatment Rehabilitation  ONSET DATE: 12/02/20  SUBJECTIVE:  EVALUATION  PERTINENT HISTORY: Patient Profile:   Jasmine Morrow is a 77 y.o. female Chief Complaint  Patient presents with  Visit Follow Up  Doing well.   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 Cardiac cath 10/2011 showed completely normal coronary arteries, however she did have mild left ventricular dysfunction with anterior wall motion abnormality. Left ventricular  EF was 51%.  History of chickenpox  Hyperlipidemia  patient states she has never been diagnosed with this  Hypertension  Knee joint replacement by other means  Macular degeneration  Major depressive disorder, recurrent, mild (CMS-HCC)  Medicare annual wellness visit, initial 08/09/2019  7/21  Multiple thyroid nodules  Normal coronary arteries 09/06/2013  Normal coronary anatomy by cardiac catheterization 10/12/11  Osteoarthritis  Ovarian cyst  PONV (postoperative nausea and vomiting)  nausea  Postoperative urinary retention 12/08/2020  Rash on lips 12/21/2020  Seasonal allergies  Sinusitis, unspecified  Sleep apnea  On CPAP.  Slow transit constipation 12/10/2020  Trimalleolar fracture of ankle, closed, right, initial encounter 02/07/2019  Complicated by staph infection 1/21, 4 weeks IV Ancef, Menz  Ulcer   Past Surgical History:  Procedure Laterality Date  SALPINGO OOPHORECTOMY Bilateral 1993  Right total knee arthroplasty 10/11/2006  Dr Ernest Pine  Left total knee arthroplasty 10/12/2007  Dr Ernest Pine  COLONOSCOPY 07/30/2012  internal hemorrhoids, diverticulosis  ANKLE ARTHODESIS W/ ARTHROSCOPY Right 02/08/2019  03/14/2019 second surgery  COLONOSCOPY 02/22/2019  Blood in entire colon/Diverticulosis - Presumed diverticular bleed. No repeat recommended per TKT.  EGD 02/22/2019  Gastritis/gastric ulcers/Hiatal hernia/Otherwise normal - no repeat  recommended per TKT.  ORIF ANKLE FRACTURE Right 03/14/2019  Dr. Rosita Kea  REMOVAL HARDWARE ANKLE FOOT/TOES Right 11/12/2019  Dr. Rosita Kea  Right knee arthrotomy, irrigation and debridement of the right knee, polyethylene exchange 01/24/2020  Dr Ernest Pine  Removal of hardware (plates and screws) from the right ankle with irrigation, debridement, and placement of Stimulan antibiotic beads 04/17/2020  Dr Ernest Pine  Right knee arthrotomy, extensive irrigation debridement, removal of right total knee implants, and placement of antibiotic impregnated  polymethylmethacrylate cement spacer 04/17/2020  Dr Ernest Pine  AMPUTATION LEG ABOVE KNEE AKA Right 12/02/2020  Procedure: AMPUTATION, THIGH, THROUGH FEMUR, ANY LEVEL; Surgeon: Starr Lake, MD; Location: [facility]; Service: Orthopedics; Laterality: Right;  TRANSFER ADJACENT TISSUE LEG Right 12/02/2020  Procedure: ADJACENT TISSUE TRANSFER OR REARRANGEMENT, LEG; DEFECT 10 SQ CM OR LESS; Surgeon: Starr Lake, MD; Location: [facility]; Service: Orthopedics; Laterality: Right;  ASPIRATION/INJECTION MAJOR JOINT/BURSA KNEE Left 12/02/2020  Procedure: ARTHROCENTESIS, ASPIRATION AND/OR INJECTION, MAJOR JOINT OR BURSA, KNEE; WITHOUT ULTRASOUND GUIDANCE; Surgeon: Starr Lake, MD; Location: [facility]; Service: Orthopedics; Laterality: Left;  REMOVAL KNEE PROSTHESIS W/POSSIBLE PLACEMENT SPACER Left 01/18/2021  Procedure: REMOVAL OF PROSTHESIS, INCLUDING TOTAL KNEE PROSTHESIS, METHYLMETHACRYLATE WITH OR WITHOUT INSERTION OF SPACER, KNEE; Surgeon: Celine Mans, MD; Location: [facility]; Service: Orthopedics; Laterality: Left;  INSERTION NON-BIODEGRADABLE DRUG DELIVERY IMPLANT Left 01/18/2021  Procedure: INSERTION, KNEE, bioresorbable, biodegradable, NON-BIODEGRADABLE DRUG DELIVERY IMPLANT; Surgeon: Celine Mans, MD; Location: [facility]; Service: Orthopedics; Laterality: Left;  ABOVE KNEE LEG AMPUTATION  12/02/2020  APPENDECTOMY Est 1968  CESAREAN SECTION  CHOLECYSTECTOMY  FRACTURE SURGERY 1/21  HYSTERECTOMY partial  JOINT REPLACEMENT 2008 & 2009  TUBAL LIGATION 3/79   ALLERGIES: Allergies  Allergen Reactions  Singulair [Montelukast] Other (See Comments)  Gland swelling  Sulfa (Sulfonamide Antibiotics) Swelling  Oxycodone Dizziness and Other (See Comments)  Vancomycin Analogues Other (See Comments)  Ringing in ears  Avinza [Morphine] Itching  Darvocet A500 [Propoxyphene N-Acetaminophen] Nausea  Nickel Rash  Ultracet  [Tramadol-Acetaminophen] Rash  Vicodin [Hydrocodone-Acetaminophen] Vomiting  Vioxx [Rofecoxib] Other (See Comments)  GI   CURRENT MEDICATIONS: Current Outpatient Medications: acetaminophen (TYLENOL) 500 MG tablet, Take 1,000 mg by mouth 3 (three) times a day Twice daily per patient., PRN Not Currently Taking aspirin 81 MG EC tablet, Take 1 tablet (81 mg total) by mouth at bedtime, Taking calcium carbonate-vitamin D3 (CALTRATE 600+D) 600 mg-10 mcg (400 unit) tablet, Take 0.5 tablets by mouth 2 (two) times daily, Taking cetirizine (ZYRTEC) 10 mg capsule, Take 1 capsule (10 mg total) by mouth once daily, Taking escitalopram oxalate (LEXAPRO) 10 MG tablet, Take 1 tablet (10 mg total) by mouth once daily, Taking estradioL (ESTRACE) 0.01 % (0.1 mg/gram) vaginal cream, Place 2 g vaginally twice a week, Taking folic acid/multivit,iron,miner (MULTIVIT-IRON-MIN-FOLIC ACID ORAL), Take 1 tablet by mouth once daily, Taking gabapentin (NEURONTIN) 300 MG capsule, Take 1 capsule (300 mg total) by mouth 3 (three) times daily Phantom limb pain, Taking melatonin 3 mg tablet, Take 1 tablet (3 mg total) by mouth at bedtime, Taking multivitamin with iron (COMPLETE MULTIVITAMIN-MINERAL) tablet, Take 1 tablet by mouth, Taking multivitamin with minerals, EYE, (PRESERVISION AREDS 2) soft gel capsule, Take 1 capsule by mouth 2 (two) times daily, Taking nystatin (MYCOSTATIN) 100,000 unit/gram powder, Apply small amount topically twice daily for rash under breasts, PRN Not Currently Taking pantoprazole (PROTONIX) 40 MG DR tablet, Take 1 tablet (40 mg total) by mouth 2 (two) times daily. Please call to schedule an appt. Thanks!, Taking polyethylene  glycol (MIRALAX) powder, Take 17 g by mouth once daily Mix in 4-8ounces of fluid prior to taking., Taking sennosides-docusate (SENOKOT-S) 8.6-50 mg tablet, Take 1 tablet by mouth 2 (two) times daily For constipation, Taking lactose-reduced food (ENSURE PLUS ORAL), Take 237 mLs by  mouth 2 (two) times daily With breakfast and dinner  HPI   CLINICAL SUMMARY:  Patient post right AKA and working with prosthesis. 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 Able to do housework, improving  PAIN:  Are you having pain? Yes: NPRS scale: 5/10 Pain location: L knee Pain description: aching Aggravating factors: walking Relieving factors: rest  PRECAUTIONS: None  WEIGHT BEARING RESTRICTIONS No  FALLS:  Has patient fallen in last 6 months? No  LIVING ENVIRONMENT: Lives with: lives with their spouse Lives in: House/apartment Stairs: No Has following equipment at home: Dan Humphreys - 4 wheeled and Wheelchair (manual)  OCCUPATION: retired  PLOF: Requires assistive device for independence  PATIENT GOALS  improve standing tolerance/ pull up pants/ ambulate with SPC safely.    OBJECTIVE:   PATIENT SURVEYS:  FOTO initial 46/ goal 64.   11/2: 50  COGNITION:  Overall cognitive status: Within functional limits for tasks assessed     SENSATION: WFL  POSTURE: rounded shoulders, forward head, flexed trunk , and weight shift left  PALPATION: No tenderness along R distal residual limb.  Discussed phantom limb sensation.    LOWER EXTREMITY ROM:  B LE AROM WFL except L knee extension secondary to articulating spacer.  Did not assess L/R hip extension at this time.    LOWER EXTREMITY MMT:  MMT Right eval Left eval  Hip flexion 4/5 4/5  Hip extension    Hip abduction 4+/5 4+/5  Hip adduction 4+/5 4+/5  Hip internal rotation    Hip external rotation    Knee flexion N/A 4+/5  Knee extension N/A 4+/5  Ankle dorsiflexion  5/5  Ankle plantarflexion    Ankle inversion    Ankle eversion     (Blank rows = not tested)  GAIT: Distance walked: in gym/ //-bars Assistive device utilized: Environmental consultant - 2 wheeled Level of assistance: CGA Comments: Cuing for posture correction with mirror feedback.  Flexed posture/ heavy UE assist.    TUG: UE assist  required to stand/ use of RW.  51.9 seconds.     TODAY'S TREATMENT:  09/12/2022  Subjective: Pt. Arrived to PT with husband and donned prosthetic leg on blue mat table.  Pt. Focused on improving standing/ walking/ stair climbing endurance and independence.  Pt. Has not worn prosthetic leg over the weekend.  Pt. Attended church in w/c and performed a solo in choir.    Objective:   There.ex.:  No charge   Nustep L5 10+ mins (0.5 miles) B UE/LE (consistent cadence/ no residual limb pain or tenderness reported).  Discussed weekend activities/ fit of prosthetic leg.                Gait training:    Amb. From blue mat table to Nustep with use of RW and focus on step through gait.  SBA/mod. I on R to ensure R knee mechanism locks out before wt. Bearing.  No verbal cuing required and consistent gait pattern/ step pattern.  PT adjusted/tightened fit several times.  Good prosthetic fit today.        Walking in //-bars forward/backwards 3 laps each with heavy UE assist and cuing to correct upright posture with mirror feedback.  Good step  length/ heel strike.    Standing tolerance testing with added cone reaching:  4 min. 40 sec (1st attempt), 3 min. 14 sec. (2nd attempt). Requires frequent UE assist on //-bars to reset balance.  CGA during activity for safety/ cuing.    Walking from //-bars to car with use of RW and consistent recip. Gait pattern.  1 short standing rest break.  Pt. Able to go from standing to sitting at passenger side of car with mod. I.    Not Today Stair training with B handrails forward on ascent and descent.  Step to gait pattern with heavy UE assist (4 steps x 3)- seated rest break required due to fatigue/shoulder discomfort.  Pt. C/o "pinching" in thigh due to prosthetic leg.  Pt. Able to sit down in chair and remove prosthetic leg/ gel liner to assess.      PATIENT EDUCATION:  Education details: HEP/ gait and prosthetic training.  Person educated: Patient and  Spouse Education method: Explanation, Demonstration, and Verbal cues Education comprehension: verbalized understanding, returned demonstration, and tactile cues required   HOME EXERCISE PROGRAM: Prone position hip stretches/ Standing marching at kitchen counter with w/c behind patient.      ASSESSMENT:  CLINICAL IMPRESSION:    Patient presents to PT in w/c with prosthetic leg in hand. Highly motivated to become more independent with use of RW as well as introducing use of rollator for mobility. Continues to ambulate with good BOS/step pattern with no episodes of R knee mechanism buckling this session. Session focused on standing tolerance and upright posture in //-bars/RW.  Pt. Remains motivated to be able to ascend/descends stairs at beach in approximately a month. No residual limb discomfort today.  Patient will continue to benefit from skilled PT services to improve standing tolerance/ gait independence and ADL.  OBJECTIVE IMPAIRMENTS Abnormal gait, decreased activity tolerance, decreased balance, decreased endurance, decreased mobility, difficulty walking, decreased ROM, decreased strength, decreased safety awareness, impaired flexibility, improper body mechanics, prosthetic dependency , and pain.   ACTIVITY LIMITATIONS carrying, lifting, standing, squatting, stairs, transfers, bed mobility, bathing, toileting, dressing, hygiene/grooming, and locomotion level  PARTICIPATION LIMITATIONS: cleaning, laundry, driving, shopping, community activity, and yard work  PERSONAL FACTORS Past/current experiences are also affecting patient's functional outcome.   REHAB POTENTIAL: Good  CLINICAL DECISION MAKING: Evolving/moderate complexity  EVALUATION COMPLEXITY: High   GOALS: Goals reviewed with patient Yes  LONG TERM GOALS: Target date: 11/02/22  Pt. Will increase FOTO to 54 to improve functional mobility.  Baseline: initial FOTO 46.  11/2: 50.  4/25: 60  Goal status: Goal met  2.  Pt.  Able to tolerate standing 10 minutes while pulling up pants with mod. I safely while wearing prosthetic leg to improve daily activities.  Baseline:  limited standing tolerance Goal status: Partially met  3.  Pt. Will ambulate 200 feet with use of least assistive device and mod. I to promote safety with household tasks/ walking into bathroom.   Baseline:  amb. With RW and min. A for safety.  2/27: walking in clinic with SBA/mod. I for 100 feet with RW and use of prosthetic leg.  6/11: amb. With RW or rollator <100 feet with SBA for RW/ CGA for rollator. 7/18: 120' with SBA with RW/CGA for rollator Goal status: Ongoing  4.  Pt. Will ascend 10 stairs with use of single forearm crutch/ handrail and step to pattern with mod. I safely.  Baseline:  see above; 7/18: ascends 4 stairs with step to pattern, with B rails,  and CGA Goal status: Ongoing  PLAN: PT FREQUENCY: 2x/week  PT DURATION: 8 weeks  PLANNED INTERVENTIONS: Therapeutic exercises, Therapeutic activity, Neuromuscular re-education, Balance training, Gait training, Patient/Family education, Self Care, Joint mobilization, Prosthetic training, DME instructions, and Manual therapy  PLAN FOR NEXT SESSION:  Progress gait/ standing tolerance.  F/u on upright rollator.  FOCUS ON STAIRS to prepare for beach trip in October (14 stairs to enter house).      Cammie Mcgee, PT, DPT # [phone] Physical Therapist - [place]  [facility]  2:05 PM,09/12/22

## 2022-09-14 ENCOUNTER — Ambulatory Visit: Payer: Medicare Other | Admitting: Physical Therapy

## 2022-09-14 DIAGNOSIS — Z89611 Acquired absence of right leg above knee: Secondary | ICD-10-CM

## 2022-09-14 DIAGNOSIS — R293 Abnormal posture: Secondary | ICD-10-CM

## 2022-09-14 DIAGNOSIS — R269 Unspecified abnormalities of gait and mobility: Secondary | ICD-10-CM

## 2022-09-14 DIAGNOSIS — M6281 Muscle weakness (generalized): Secondary | ICD-10-CM

## 2022-09-15 ENCOUNTER — Encounter: Payer: Medicare Other | Admitting: Physical Therapy

## 2022-09-20 ENCOUNTER — Ambulatory Visit: Payer: Medicare Other | Admitting: Physical Therapy

## 2022-09-20 DIAGNOSIS — Z89611 Acquired absence of right leg above knee: Secondary | ICD-10-CM

## 2022-09-20 DIAGNOSIS — R269 Unspecified abnormalities of gait and mobility: Secondary | ICD-10-CM | POA: Diagnosis not present

## 2022-09-20 DIAGNOSIS — M6281 Muscle weakness (generalized): Secondary | ICD-10-CM

## 2022-09-20 DIAGNOSIS — R293 Abnormal posture: Secondary | ICD-10-CM

## 2022-09-20 NOTE — Therapy (Signed)
OUTPATIENT PHYSICAL THERAPY LOWER EXTREMITY TREATMENT   Patient Name: Jasmine Morrow MRN: [id number] DOB:[birth year], 76 y.o., female Today's Date: 09/20/2022    PT End of Session - 09/20/22 0927     Visit Number 78    Number of Visits 92    Date for PT Re-Evaluation 11/02/22    PT Start Time 1013    PT Stop Time 1110    PT Time Calculation (min) 57 min    Activity Tolerance Patient tolerated treatment well    Behavior During Therapy WFL for tasks assessed/performed             Past Medical History:  Diagnosis Date   Anemia    Anxiety    Asthma    seasonal    GERD (gastroesophageal reflux disease)    H/O above knee amputation, 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   Hypertension    PONV (postoperative nausea and vomiting)    Seasonal allergies    Sleep apnea    does not uses CPAP machine anymore    Wheelchair dependent    able to self transfer   Past Surgical History:  Procedure Laterality Date   APPENDECTOMY     CATARACT EXTRACTION W/PHACO Right 02/28/2022   Procedure: CATARACT EXTRACTION PHACO AND INTRAOCULAR LENS PLACEMENT (IOC) RIGHT  4.82  00:30.9;  Surgeon: Nevada Crane, MD;  Location: [facility];  Service: Ophthalmology;  Laterality: Right;  sleep apnea   CATARACT EXTRACTION W/PHACO Left 03/14/2022   Procedure: CATARACT EXTRACTION PHACO AND INTRAOCULAR LENS PLACEMENT (IOC) LEFT;  Surgeon: Nevada Crane, MD;  Location: [facility];  Service: Ophthalmology;  Laterality: Left;  4.32 0:39.7   CESAREAN SECTION     CHOLECYSTECTOMY     DILATION AND CURETTAGE OF UTERUS     ESOPHAGOGASTRODUODENOSCOPY N/A 02/22/2019   Procedure: ESOPHAGOGASTRODUODENOSCOPY (EGD);  Surgeon: Toledo, Boykin Nearing, MD;  Location: [facility];  Service: Gastroenterology;  Laterality: N/A;   FLEXIBLE SIGMOIDOSCOPY N/A 02/22/2019   Procedure: FLEXIBLE SIGMOIDOSCOPY;  Surgeon: Toledo, Boykin Nearing, MD;  Location: [facility];  Service: Gastroenterology;  Laterality: N/A;    HARDWARE REMOVAL Right 11/12/2019   Procedure: Right ankle hardware removal;  Surgeon: Kennedy Bucker, MD;  Location: [facility];  Service: Orthopedics;  Laterality: Right;   HARDWARE REMOVAL Right 04/17/2020   Procedure: HARDWARE REMOVAL FROM RIGHT ANKLE AND RIGHT KNEE; IMPLANT OF CEMENT SPACERS IN RIGHT KNEE;  Surgeon: Donato Heinz, MD;  Location: [facility];  Service: Orthopedics;  Laterality: Right;   JOINT REPLACEMENT     LEG AMPUTATION ABOVE KNEE Right 12/02/2020   [facility]   ORIF ANKLE FRACTURE Right 03/14/2019   Procedure: OPEN REDUCTION INTERNAL FIXATION (ORIF) ANKLE FRACTURE, MEDIAL MALLEOLUS;  Surgeon: Kennedy Bucker, MD;  Location: [facility];  Service: Orthopedics;  Laterality: Right;   ORIF ANKLE FRACTURE Right 02/08/2019   Procedure: OPEN REDUCTION INTERNAL FIXATION (ORIF) ANKLE FRACTURE, post malleolus;  Surgeon: Kennedy Bucker, MD;  Location: [facility];  Service: Orthopedics;  Laterality: Right;   PARTIAL HYSTERECTOMY  1993   REPLACEMENT TOTAL KNEE Bilateral 2008   2009   SCAR DEBRIDEMENT OF TOTAL KNEE  01/2020   SYNDESMOSIS REPAIR Right 02/08/2019   Procedure: SYNDESMOSIS REPAIR;  Surgeon: Kennedy Bucker, MD;  Location: [facility];  Service: Orthopedics;  Laterality: Right;   TEE WITHOUT CARDIOVERSION N/A 02/26/2019   Procedure: TRANSESOPHAGEAL ECHOCARDIOGRAM (TEE);  Surgeon: Lamar Blinks, MD;  Location: [facility];  Service: Cardiovascular;  Laterality: N/A;   TOTAL KNEE REVISION WITH SCAR DEBRIDEMENT/PATELLA REVISION  WITH POLY EXCHANGE Right 01/24/2020   Procedure: TOTAL KNEE REVISION WITH SCAR DEBRIDEMENT/PATELLA REVISION WITH POLY EXCHANGE;  Surgeon: Donato Heinz, MD;  Location: [facility];  Service: Orthopedics;  Laterality: Right;   TUMOR REMOVAL     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   Patient Active Problem List   Diagnosis Date Noted   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   Anxiety 04/17/2020   History of GI diverticular bleed 04/17/2020   Chest pain  04/17/2020   PAD (peripheral artery disease) (HCC) 04/05/2020   Ulcer of right leg (HCC) 04/05/2020   COPD (chronic obstructive pulmonary disease) (HCC) 04/05/2020   GERD (gastroesophageal reflux disease) 04/05/2020   Abscess of right knee 01/22/2020   Pressure injury of skin 02/28/2019   SOB (shortness of breath)    Gastrointestinal hemorrhage    Sepsis (HCC)    Symptomatic anemia 02/20/2019   Trimalleolar fracture of ankle, closed, right, initial encounter 02/07/2019   Multiple lung nodules 03/31/2014   Extrinsic asthma 03/27/2014   OSA on CPAP 03/27/2014    PCP: Danella Penton, MD  REFERRING PROVIDER: Danella Penton, MD  REFERRING DIAG: Above knee amputation of right lower extremity  THERAPY DIAG:  Gait difficulty  S/P AKA (above knee amputation) unilateral, right (HCC)  Muscle weakness (generalized)  Abnormal posture  Rationale for Evaluation and Treatment Rehabilitation  ONSET DATE: 12/02/20  SUBJECTIVE:  EVALUATION  PERTINENT HISTORY: Patient Profile:   Jasmine Morrow is a 77 y.o. female Chief Complaint  Patient presents with  Visit Follow Up  Doing well.   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 Cardiac cath 10/2011 showed completely normal coronary arteries, however she did have mild left ventricular dysfunction with anterior wall motion abnormality. Left ventricular  EF was 51%.  History of chickenpox  Hyperlipidemia  patient states she has never been diagnosed with this  Hypertension  Knee joint replacement by other means  Macular degeneration  Major depressive disorder, recurrent, mild (CMS-HCC)  Medicare annual wellness visit, initial 08/09/2019  7/21  Multiple thyroid nodules  Normal coronary arteries 09/06/2013  Normal coronary anatomy by cardiac catheterization 10/12/11  Osteoarthritis  Ovarian cyst  PONV (postoperative nausea and vomiting)  nausea  Postoperative urinary retention 12/08/2020  Rash on lips 12/21/2020  Seasonal allergies  Sinusitis, unspecified  Sleep apnea  On CPAP.  Slow transit constipation 12/10/2020  Trimalleolar fracture of ankle, closed, right, initial encounter 02/07/2019  Complicated by staph infection 1/21, 4 weeks IV Ancef, Menz  Ulcer   Past Surgical History:  Procedure Laterality Date  SALPINGO OOPHORECTOMY Bilateral 1993  Right total knee arthroplasty 10/11/2006  Dr Ernest Pine  Left total knee arthroplasty 10/12/2007  Dr Ernest Pine  COLONOSCOPY 07/30/2012  internal hemorrhoids, diverticulosis  ANKLE ARTHODESIS W/ ARTHROSCOPY Right 02/08/2019  03/14/2019 second surgery  COLONOSCOPY 02/22/2019  Blood in entire colon/Diverticulosis - Presumed diverticular bleed. No repeat recommended per TKT.  EGD 02/22/2019  Gastritis/gastric ulcers/Hiatal hernia/Otherwise normal - no repeat  recommended per TKT.  ORIF ANKLE FRACTURE Right 03/14/2019  Dr. Rosita Kea  REMOVAL HARDWARE ANKLE FOOT/TOES Right 11/12/2019  Dr. Rosita Kea  Right knee arthrotomy, irrigation and debridement of the right knee, polyethylene exchange 01/24/2020  Dr Ernest Pine  Removal of hardware (plates and screws) from the right ankle with irrigation, debridement, and placement of Stimulan antibiotic beads 04/17/2020  Dr Ernest Pine  Right knee arthrotomy, extensive irrigation debridement, removal of right total knee implants, and placement of antibiotic impregnated  polymethylmethacrylate cement spacer 04/17/2020  Dr Ernest Pine  AMPUTATION LEG ABOVE KNEE AKA Right 12/02/2020  Procedure: AMPUTATION, THIGH, THROUGH FEMUR, ANY LEVEL; Surgeon: Starr Lake, MD; Location: [facility]; Service: Orthopedics; Laterality: Right;  TRANSFER ADJACENT TISSUE LEG Right 12/02/2020  Procedure: ADJACENT TISSUE TRANSFER OR REARRANGEMENT, LEG; DEFECT 10 SQ CM OR LESS; Surgeon: Starr Lake, MD; Location: [facility]; Service: Orthopedics; Laterality: Right;  ASPIRATION/INJECTION MAJOR JOINT/BURSA KNEE Left 12/02/2020  Procedure: ARTHROCENTESIS, ASPIRATION AND/OR INJECTION, MAJOR JOINT OR BURSA, KNEE; WITHOUT ULTRASOUND GUIDANCE; Surgeon: Starr Lake, MD; Location: [facility]; Service: Orthopedics; Laterality: Left;  REMOVAL KNEE PROSTHESIS W/POSSIBLE PLACEMENT SPACER Left 01/18/2021  Procedure: REMOVAL OF PROSTHESIS, INCLUDING TOTAL KNEE PROSTHESIS, METHYLMETHACRYLATE WITH OR WITHOUT INSERTION OF SPACER, KNEE; Surgeon: Celine Mans, MD; Location: [facility]; Service: Orthopedics; Laterality: Left;  INSERTION NON-BIODEGRADABLE DRUG DELIVERY IMPLANT Left 01/18/2021  Procedure: INSERTION, KNEE, bioresorbable, biodegradable, NON-BIODEGRADABLE DRUG DELIVERY IMPLANT; Surgeon: Celine Mans, MD; Location: [facility]; Service: Orthopedics; Laterality: Left;  ABOVE KNEE LEG AMPUTATION  12/02/2020  APPENDECTOMY Est 1968  CESAREAN SECTION  CHOLECYSTECTOMY  FRACTURE SURGERY 1/21  HYSTERECTOMY partial  JOINT REPLACEMENT 2008 & 2009  TUBAL LIGATION 3/79   ALLERGIES: Allergies  Allergen Reactions  Singulair [Montelukast] Other (See Comments)  Gland swelling  Sulfa (Sulfonamide Antibiotics) Swelling  Oxycodone Dizziness and Other (See Comments)  Vancomycin Analogues Other (See Comments)  Ringing in ears  Avinza [Morphine] Itching  Darvocet A500 [Propoxyphene N-Acetaminophen] Nausea  Nickel Rash  Ultracet  [Tramadol-Acetaminophen] Rash  Vicodin [Hydrocodone-Acetaminophen] Vomiting  Vioxx [Rofecoxib] Other (See Comments)  GI   CURRENT MEDICATIONS: Current Outpatient Medications: acetaminophen (TYLENOL) 500 MG tablet, Take 1,000 mg by mouth 3 (three) times a day Twice daily per patient., PRN Not Currently Taking aspirin 81 MG EC tablet, Take 1 tablet (81 mg total) by mouth at bedtime, Taking calcium carbonate-vitamin D3 (CALTRATE 600+D) 600 mg-10 mcg (400 unit) tablet, Take 0.5 tablets by mouth 2 (two) times daily, Taking cetirizine (ZYRTEC) 10 mg capsule, Take 1 capsule (10 mg total) by mouth once daily, Taking escitalopram oxalate (LEXAPRO) 10 MG tablet, Take 1 tablet (10 mg total) by mouth once daily, Taking estradioL (ESTRACE) 0.01 % (0.1 mg/gram) vaginal cream, Place 2 g vaginally twice a week, Taking folic acid/multivit,iron,miner (MULTIVIT-IRON-MIN-FOLIC ACID ORAL), Take 1 tablet by mouth once daily, Taking gabapentin (NEURONTIN) 300 MG capsule, Take 1 capsule (300 mg total) by mouth 3 (three) times daily Phantom limb pain, Taking melatonin 3 mg tablet, Take 1 tablet (3 mg total) by mouth at bedtime, Taking multivitamin with iron (COMPLETE MULTIVITAMIN-MINERAL) tablet, Take 1 tablet by mouth, Taking multivitamin with minerals, EYE, (PRESERVISION AREDS 2) soft gel capsule, Take 1 capsule by mouth 2 (two) times daily, Taking nystatin (MYCOSTATIN) 100,000 unit/gram powder, Apply small amount topically twice daily for rash under breasts, PRN Not Currently Taking pantoprazole (PROTONIX) 40 MG DR tablet, Take 1 tablet (40 mg total) by mouth 2 (two) times daily. Please call to schedule an appt. Thanks!, Taking polyethylene  glycol (MIRALAX) powder, Take 17 g by mouth once daily Mix in 4-8ounces of fluid prior to taking., Taking sennosides-docusate (SENOKOT-S) 8.6-50 mg tablet, Take 1 tablet by mouth 2 (two) times daily For constipation, Taking lactose-reduced food (ENSURE PLUS ORAL), Take 237 mLs by  mouth 2 (two) times daily With breakfast and dinner  HPI   CLINICAL SUMMARY:  Patient post right AKA and working with prosthesis. 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 Able to do housework, improving  PAIN:  Are you having pain? Yes: NPRS scale: 5/10 Pain location: L knee Pain description: aching Aggravating factors: walking Relieving factors: rest  PRECAUTIONS: None  WEIGHT BEARING RESTRICTIONS No  FALLS:  Has patient fallen in last 6 months? No  LIVING ENVIRONMENT: Lives with: lives with their spouse Lives in: House/apartment Stairs: No Has following equipment at home: Dan Humphreys - 4 wheeled and Wheelchair (manual)  OCCUPATION: retired  PLOF: Requires assistive device for independence  PATIENT GOALS  improve standing tolerance/ pull up pants/ ambulate with SPC safely.    OBJECTIVE:   PATIENT SURVEYS:  FOTO initial 46/ goal 53.   11/2: 50  COGNITION:  Overall cognitive status: Within functional limits for tasks assessed     SENSATION: WFL  POSTURE: rounded shoulders, forward head, flexed trunk , and weight shift left  PALPATION: No tenderness along R distal residual limb.  Discussed phantom limb sensation.    LOWER EXTREMITY ROM:  B LE AROM WFL except L knee extension secondary to articulating spacer.  Did not assess L/R hip extension at this time.    LOWER EXTREMITY MMT:  MMT Right eval Left eval  Hip flexion 4/5 4/5  Hip extension    Hip abduction 4+/5 4+/5  Hip adduction 4+/5 4+/5  Hip internal rotation    Hip external rotation    Knee flexion N/A 4+/5  Knee extension N/A 4+/5  Ankle dorsiflexion  5/5  Ankle plantarflexion    Ankle inversion    Ankle eversion     (Blank rows = not tested)  GAIT: Distance walked: in gym/ //-bars Assistive device utilized: Environmental consultant - 2 wheeled Level of assistance: CGA Comments: Cuing for posture correction with mirror feedback.  Flexed posture/ heavy UE assist.    TUG: UE assist  required to stand/ use of RW.  51.9 seconds.     TODAY'S TREATMENT:  09/14/2022  Subjective: Pt. Arrived to PT with husband and donned prosthetic leg on blue mat table.  Pt. Focused on improving standing/ walking/ stair climbing endurance and independence.  Pt. Wants to attempt walking at [facility] this weekend but pts. Husband is hesitant.     Objective:   There.ex.:  No charge   Nustep L5 10+ mins (0.5 miles) B UE/LE (consistent cadence/ no residual limb pain or tenderness reported).  Discussed weekend activities/ fit of prosthetic leg.                Gait training:    Amb. From blue mat table to Nustep with use of RW and focus on step through gait.  SBA/mod. I on R to ensure R knee mechanism locks out before wt. Bearing.  No verbal cuing required and consistent gait pattern/ step pattern.  PT adjusted/tightened fit several times.  Good prosthetic fit today.     Stair training with B handrails forward on ascent and descent.  Step to gait pattern with heavy UE assist (4 steps x 3)- seated rest break required due to fatigue/shoulder discomfort.  Walking in //-bars forward/backwards 3 laps each with heavy UE assist and cuing to correct upright posture with mirror feedback.  Good step length/ heel strike.    Walking in PT gym with use of RW and from stairs to car with use of RW and consistent recip. Gait pattern.  2 short standing rest break.  Pts. Husband followed pt. With w/c to car.  Pt. Able to manage standing to sitting in passenger side of car while wearing prosthetic leg.  PT assisted pt.      PATIENT EDUCATION:  Education details: HEP/ gait and prosthetic training.  Person educated: Patient and Spouse Education method: Explanation, Demonstration, and Verbal cues Education comprehension: verbalized understanding, returned demonstration, and tactile cues required   HOME EXERCISE PROGRAM: Prone position hip stretches/ Standing marching at kitchen counter with w/c behind patient.       ASSESSMENT:  CLINICAL IMPRESSION:    Highly motivated to become more independent with use of RW as well as introducing use of rollator for mobility. Continues to ambulate with good BOS/step pattern with no episodes of R knee mechanism buckling this session. Session focused on standing endurance/ mod. I with stairs and upright posture in //-bars/RW.  Pt. Remains motivated to be able to ascend/descends stairs at beach in approximately a month. No residual limb discomfort or pinching today.  Patient will continue to benefit from skilled PT services to improve standing tolerance/ gait independence and ADL.  OBJECTIVE IMPAIRMENTS Abnormal gait, decreased activity tolerance, decreased balance, decreased endurance, decreased mobility, difficulty walking, decreased ROM, decreased strength, decreased safety awareness, impaired flexibility, improper body mechanics, prosthetic dependency , and pain.   ACTIVITY LIMITATIONS carrying, lifting, standing, squatting, stairs, transfers, bed mobility, bathing, toileting, dressing, hygiene/grooming, and locomotion level  PARTICIPATION LIMITATIONS: cleaning, laundry, driving, shopping, community activity, and yard work  PERSONAL FACTORS Past/current experiences are also affecting patient's functional outcome.   REHAB POTENTIAL: Good  CLINICAL DECISION MAKING: Evolving/moderate complexity  EVALUATION COMPLEXITY: High   GOALS: Goals reviewed with patient Yes  LONG TERM GOALS: Target date: 11/02/22  Pt. Will increase FOTO to 54 to improve functional mobility.  Baseline: initial FOTO 46.  11/2: 50.  4/25: 60  Goal status: Goal met  2.  Pt. Able to tolerate standing 10 minutes while pulling up pants with mod. I safely while wearing prosthetic leg to improve daily activities.  Baseline:  limited standing tolerance Goal status: Partially met  3.  Pt. Will ambulate 200 feet with use of least assistive device and mod. I to promote safety with household  tasks/ walking into bathroom.   Baseline:  amb. With RW and min. A for safety.  2/27: walking in clinic with SBA/mod. I for 100 feet with RW and use of prosthetic leg.  6/11: amb. With RW or rollator <100 feet with SBA for RW/ CGA for rollator. 7/18: 120' with SBA with RW/CGA for rollator Goal status: Ongoing  4.  Pt. Will ascend 10 stairs with use of single forearm crutch/ handrail and step to pattern with mod. I safely.  Baseline:  see above; 7/18: ascends 4 stairs with step to pattern, with B rails, and CGA Goal status: Ongoing  PLAN: PT FREQUENCY: 2x/week  PT DURATION: 8 weeks  PLANNED INTERVENTIONS: Therapeutic exercises, Therapeutic activity, Neuromuscular re-education, Balance training, Gait training, Patient/Family education, Self Care, Joint mobilization, Prosthetic training, DME instructions, and Manual therapy  PLAN FOR NEXT SESSION:  Progress gait/ standing tolerance.  F/u on upright rollator.  FOCUS ON STAIRS to prepare  for beach trip in September (14 stairs to enter house).      Cammie Mcgee, PT, DPT # [phone] Physical Therapist - [place]  [facility]  9:32 AM,09/20/22

## 2022-09-21 ENCOUNTER — Encounter: Payer: Self-pay | Admitting: Physical Therapy

## 2022-09-21 NOTE — Therapy (Signed)
OUTPATIENT PHYSICAL THERAPY LOWER EXTREMITY TREATMENT   Patient Name: Jasmine Morrow MRN: [id number] DOB:07-07-1945, 77 y.o., female Today's Date: 09/20/22   PT End of Session - 09/21/22 0807     Visit Number 79    Number of Visits 92    Date for PT Re-Evaluation 11/02/22    PT Start Time 0939    PT Stop Time 1032    PT Time Calculation (min) 53 min    Activity Tolerance Patient tolerated treatment well    Behavior During Therapy [facility] for tasks assessed/performed             Past Medical History:  Diagnosis Date   Anemia    Anxiety    Asthma    seasonal    GERD (gastroesophageal reflux disease)    H/O above knee amputation, 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   Hypertension    PONV (postoperative nausea and vomiting)    Seasonal allergies    Sleep apnea    does not uses CPAP machine anymore    Wheelchair dependent    able to self transfer   Past Surgical History:  Procedure Laterality Date   APPENDECTOMY     CATARACT EXTRACTION W/PHACO Right 02/28/2022   Procedure: CATARACT EXTRACTION PHACO AND INTRAOCULAR LENS PLACEMENT (IOC) RIGHT  4.82  00:30.9;  Surgeon: Nevada Crane, MD;  Location: [facility];  Service: Ophthalmology;  Laterality: Right;  sleep apnea   CATARACT EXTRACTION W/PHACO Left 03/14/2022   Procedure: CATARACT EXTRACTION PHACO AND INTRAOCULAR LENS PLACEMENT (IOC) LEFT;  Surgeon: Nevada Crane, MD;  Location: [facility];  Service: Ophthalmology;  Laterality: Left;  4.32 0:39.7   CESAREAN SECTION     CHOLECYSTECTOMY     DILATION AND CURETTAGE OF UTERUS     ESOPHAGOGASTRODUODENOSCOPY N/A 02/22/2019   Procedure: ESOPHAGOGASTRODUODENOSCOPY (EGD);  Surgeon: Toledo, Boykin Nearing, MD;  Location: [facility];  Service: Gastroenterology;  Laterality: N/A;   FLEXIBLE SIGMOIDOSCOPY N/A 02/22/2019   Procedure: FLEXIBLE SIGMOIDOSCOPY;  Surgeon: Toledo, Boykin Nearing, MD;  Location: [facility];  Service: Gastroenterology;  Laterality: N/A;   HARDWARE  REMOVAL Right 11/12/2019   Procedure: Right ankle hardware removal;  Surgeon: Kennedy Bucker, MD;  Location: [facility];  Service: Orthopedics;  Laterality: Right;   HARDWARE REMOVAL Right 04/17/2020   Procedure: HARDWARE REMOVAL FROM RIGHT ANKLE AND RIGHT KNEE; IMPLANT OF CEMENT SPACERS IN RIGHT KNEE;  Surgeon: Donato Heinz, MD;  Location: [facility];  Service: Orthopedics;  Laterality: Right;   JOINT REPLACEMENT     LEG AMPUTATION ABOVE KNEE Right 12/02/2020   [facility]   ORIF ANKLE FRACTURE Right 03/14/2019   Procedure: OPEN REDUCTION INTERNAL FIXATION (ORIF) ANKLE FRACTURE, MEDIAL MALLEOLUS;  Surgeon: Kennedy Bucker, MD;  Location: [facility];  Service: Orthopedics;  Laterality: Right;   ORIF ANKLE FRACTURE Right 02/08/2019   Procedure: OPEN REDUCTION INTERNAL FIXATION (ORIF) ANKLE FRACTURE, post malleolus;  Surgeon: Kennedy Bucker, MD;  Location: [facility];  Service: Orthopedics;  Laterality: Right;   PARTIAL HYSTERECTOMY  1993   REPLACEMENT TOTAL KNEE Bilateral 2008   2009   SCAR DEBRIDEMENT OF TOTAL KNEE  01/2020   SYNDESMOSIS REPAIR Right 02/08/2019   Procedure: SYNDESMOSIS REPAIR;  Surgeon: Kennedy Bucker, MD;  Location: [facility];  Service: Orthopedics;  Laterality: Right;   TEE WITHOUT CARDIOVERSION N/A 02/26/2019   Procedure: TRANSESOPHAGEAL ECHOCARDIOGRAM (TEE);  Surgeon: Lamar Blinks, MD;  Location: [facility];  Service: Cardiovascular;  Laterality: N/A;   TOTAL KNEE REVISION WITH SCAR DEBRIDEMENT/PATELLA REVISION WITH  POLY EXCHANGE Right 01/24/2020   Procedure: TOTAL KNEE REVISION WITH SCAR DEBRIDEMENT/PATELLA REVISION WITH POLY EXCHANGE;  Surgeon: Donato Heinz, MD;  Location: [facility];  Service: Orthopedics;  Laterality: Right;   TUMOR REMOVAL     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   Patient Active Problem List   Diagnosis Date Noted   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   Anxiety 04/17/2020   History of GI diverticular bleed 04/17/2020   Chest pain 04/17/2020    PAD (peripheral artery disease) (HCC) 04/05/2020   Ulcer of right leg (HCC) 04/05/2020   COPD (chronic obstructive pulmonary disease) (HCC) 04/05/2020   GERD (gastroesophageal reflux disease) 04/05/2020   Abscess of right knee 01/22/2020   Pressure injury of skin 02/28/2019   SOB (shortness of breath)    Gastrointestinal hemorrhage    Sepsis (HCC)    Symptomatic anemia 02/20/2019   Trimalleolar fracture of ankle, closed, right, initial encounter 02/07/2019   Multiple lung nodules 03/31/2014   Extrinsic asthma 03/27/2014   OSA on CPAP 03/27/2014    PCP: Danella Penton, MD  REFERRING PROVIDER: Danella Penton, MD  REFERRING DIAG: Above knee amputation of right lower extremity  THERAPY DIAG:  Gait difficulty  S/P AKA (above knee amputation) unilateral, right (HCC)  Muscle weakness (generalized)  Abnormal posture  Rationale for Evaluation and Treatment Rehabilitation  ONSET DATE: 12/02/20  SUBJECTIVE:  EVALUATION  PERTINENT HISTORY: Patient Profile:   Jasmine Morrow is a 77 y.o. female Chief Complaint  Patient presents with  Visit Follow Up  Doing well.   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 Cardiac cath 10/2011 showed completely normal coronary arteries, however she did have mild left ventricular dysfunction with anterior wall motion abnormality. Left ventricular EF was 51%.   History of chickenpox  Hyperlipidemia  patient states she has never been diagnosed with this  Hypertension  Knee joint replacement by other means  Macular degeneration  Major depressive disorder, recurrent, mild (CMS-HCC)  Medicare annual wellness visit, initial 08/09/2019  7/21  Multiple thyroid nodules  Normal coronary arteries 09/06/2013  Normal coronary anatomy by cardiac catheterization 10/12/11  Osteoarthritis  Ovarian cyst  PONV (postoperative nausea and vomiting)  nausea  Postoperative urinary retention 12/08/2020  Rash on lips 12/21/2020  Seasonal allergies  Sinusitis, unspecified  Sleep apnea  On CPAP.  Slow transit constipation 12/10/2020  Trimalleolar fracture of ankle, closed, right, initial encounter 02/07/2019  Complicated by staph infection 1/21, 4 weeks IV Ancef, Menz  Ulcer   Past Surgical History:  Procedure Laterality Date  SALPINGO OOPHORECTOMY Bilateral 1993  Right total knee arthroplasty 10/11/2006  Dr Ernest Pine  Left total knee arthroplasty 10/12/2007  Dr Ernest Pine  COLONOSCOPY 07/30/2012  internal hemorrhoids, diverticulosis  ANKLE ARTHODESIS W/ ARTHROSCOPY Right 02/08/2019  03/14/2019 second surgery  COLONOSCOPY 02/22/2019  Blood in entire colon/Diverticulosis - Presumed diverticular bleed. No repeat recommended per TKT.  EGD 02/22/2019  Gastritis/gastric ulcers/Hiatal hernia/Otherwise normal - no repeat recommended  per TKT.  ORIF ANKLE FRACTURE Right 03/14/2019  Dr. Rosita Kea  REMOVAL HARDWARE ANKLE FOOT/TOES Right 11/12/2019  Dr. Rosita Kea  Right knee arthrotomy, irrigation and debridement of the right knee, polyethylene exchange 01/24/2020  Dr Ernest Pine  Removal of hardware (plates and screws) from the right ankle with irrigation, debridement, and placement of Stimulan antibiotic beads 04/17/2020  Dr Ernest Pine  Right knee arthrotomy, extensive irrigation debridement, removal of right total knee implants, and placement of antibiotic impregnated  polymethylmethacrylate cement spacer 04/17/2020  Dr Ernest Pine  AMPUTATION LEG ABOVE KNEE AKA Right 12/02/2020  Procedure: AMPUTATION, THIGH, THROUGH FEMUR, ANY LEVEL; Surgeon: Starr Lake, MD; Location: [facility]; Service: Orthopedics; Laterality: Right;  TRANSFER ADJACENT TISSUE LEG Right 12/02/2020  Procedure: ADJACENT TISSUE TRANSFER OR REARRANGEMENT, LEG; DEFECT 10 SQ CM OR LESS; Surgeon: Starr Lake, MD; Location: [facility]; Service: Orthopedics; Laterality: Right;  ASPIRATION/INJECTION MAJOR JOINT/BURSA KNEE Left 12/02/2020  Procedure: ARTHROCENTESIS, ASPIRATION AND/OR INJECTION, MAJOR JOINT OR BURSA, KNEE; WITHOUT ULTRASOUND GUIDANCE; Surgeon: Starr Lake, MD; Location: [facility]; Service: Orthopedics; Laterality: Left;  REMOVAL KNEE PROSTHESIS W/POSSIBLE PLACEMENT SPACER Left 01/18/2021  Procedure: REMOVAL OF PROSTHESIS, INCLUDING TOTAL KNEE PROSTHESIS, METHYLMETHACRYLATE WITH OR WITHOUT INSERTION OF SPACER, KNEE; Surgeon: Celine Mans, MD; Location: [facility]; Service: Orthopedics; Laterality: Left;  INSERTION NON-BIODEGRADABLE DRUG DELIVERY IMPLANT Left 01/18/2021  Procedure: INSERTION, KNEE, bioresorbable, biodegradable, NON-BIODEGRADABLE DRUG DELIVERY IMPLANT; Surgeon: Celine Mans, MD; Location: [facility]; Service: Orthopedics; Laterality: Left;  ABOVE KNEE LEG AMPUTATION  12/02/2020  APPENDECTOMY Est 1968  CESAREAN SECTION  CHOLECYSTECTOMY  FRACTURE SURGERY 1/21  HYSTERECTOMY partial  JOINT REPLACEMENT 2008 & 2009  TUBAL LIGATION 3/79   ALLERGIES: Allergies  Allergen Reactions  Singulair [Montelukast] Other (See Comments)  Gland swelling  Sulfa (Sulfonamide Antibiotics) Swelling  Oxycodone Dizziness and Other (See Comments)  Vancomycin Analogues Other (See Comments)  Ringing in ears  Avinza [Morphine] Itching  Darvocet A500 [Propoxyphene N-Acetaminophen] Nausea  Nickel Rash  Ultracet  [Tramadol-Acetaminophen] Rash  Vicodin [Hydrocodone-Acetaminophen] Vomiting  Vioxx [Rofecoxib] Other (See Comments)  GI   CURRENT MEDICATIONS: Current Outpatient Medications: acetaminophen (TYLENOL) 500 MG tablet, Take 1,000 mg by mouth 3 (three) times a day Twice daily per patient., PRN Not Currently Taking aspirin 81 MG EC tablet, Take 1 tablet (81 mg total) by mouth at bedtime, Taking calcium carbonate-vitamin D3 (CALTRATE 600+D) 600 mg-10 mcg (400 unit) tablet, Take 0.5 tablets by mouth 2 (two) times daily, Taking cetirizine (ZYRTEC) 10 mg capsule, Take 1 capsule (10 mg total) by mouth once daily, Taking escitalopram oxalate (LEXAPRO) 10 MG tablet, Take 1 tablet (10 mg total) by mouth once daily, Taking estradioL (ESTRACE) 0.01 % (0.1 mg/gram) vaginal cream, Place 2 g vaginally twice a week, Taking folic acid/multivit,iron,miner (MULTIVIT-IRON-MIN-FOLIC ACID ORAL), Take 1 tablet by mouth once daily, Taking gabapentin (NEURONTIN) 300 MG capsule, Take 1 capsule (300 mg total) by mouth 3 (three) times daily Phantom limb pain, Taking melatonin 3 mg tablet, Take 1 tablet (3 mg total) by mouth at bedtime, Taking multivitamin with iron (COMPLETE MULTIVITAMIN-MINERAL) tablet, Take 1 tablet by mouth, Taking multivitamin with minerals, EYE, (PRESERVISION AREDS 2) soft gel capsule, Take 1 capsule by mouth 2 (two) times daily, Taking nystatin (MYCOSTATIN) 100,000 unit/gram powder, Apply small amount topically twice daily for rash under breasts, PRN Not Currently Taking pantoprazole (PROTONIX) 40 MG DR tablet, Take 1 tablet (40 mg total) by mouth 2 (two) times daily. Please call to schedule an appt. Thanks!, Taking polyethylene glycol (  MIRALAX) powder, Take 17 g by mouth once daily Mix in 4-8ounces of fluid prior to taking., Taking sennosides-docusate (SENOKOT-S) 8.6-50 mg tablet, Take 1 tablet by mouth 2 (two) times daily For constipation, Taking lactose-reduced food (ENSURE PLUS ORAL), Take 237 mLs by  mouth 2 (two) times daily With breakfast and dinner  HPI   CLINICAL SUMMARY:  Patient post right AKA and working with prosthesis. 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 Able to do housework, improving  PAIN:  Are you having pain? Yes: NPRS scale: 5/10 Pain location: L knee Pain description: aching Aggravating factors: walking Relieving factors: rest  PRECAUTIONS: None  WEIGHT BEARING RESTRICTIONS No  FALLS:  Has patient fallen in last 6 months? No  LIVING ENVIRONMENT: Lives with: lives with their spouse Lives in: House/apartment Stairs: No Has following equipment at home: Dan Humphreys - 4 wheeled and Wheelchair (manual)  OCCUPATION: retired  PLOF: Requires assistive device for independence  PATIENT GOALS  improve standing tolerance/ pull up pants/ ambulate with SPC safely.    OBJECTIVE:   PATIENT SURVEYS:  FOTO initial 46/ goal 34.   11/2: 50  COGNITION:  Overall cognitive status: Within functional limits for tasks assessed     SENSATION: WFL  POSTURE: rounded shoulders, forward head, flexed trunk , and weight shift left  PALPATION: No tenderness along R distal residual limb.  Discussed phantom limb sensation.    LOWER EXTREMITY ROM:  B LE AROM WFL except L knee extension secondary to articulating spacer.  Did not assess L/R hip extension at this time.    LOWER EXTREMITY MMT:  MMT Right eval Left eval  Hip flexion 4/5 4/5  Hip extension    Hip abduction 4+/5 4+/5  Hip adduction 4+/5 4+/5  Hip internal rotation    Hip external rotation    Knee flexion N/A 4+/5  Knee extension N/A 4+/5  Ankle dorsiflexion  5/5  Ankle plantarflexion    Ankle inversion    Ankle eversion     (Blank rows = not tested)  GAIT: Distance walked: in gym/ //-bars Assistive device utilized: Environmental consultant - 2 wheeled Level of assistance: CGA Comments: Cuing for posture correction with mirror feedback.  Flexed posture/ heavy UE assist.    TUG: UE assist  required to stand/ use of RW.  51.9 seconds.     TODAY'S TREATMENT:  09/20/22  Subjective:  Pt. Did not attend [employer] this weekend secondary to husband not feeling well.  Pt. Arrived to PT in w/c and donned prosthetic leg on blue mat table prior to gait/stair training.    Objective:   There.ex.:  No charge   Nustep L5 10+ mins (0.5 miles) B UE/LE (consistent cadence/ no residual limb pain or tenderness reported).  Discussed weekend activities/ fit of prosthetic leg.                Gait training:    Amb. From blue mat table to Nustep with use of RW and focus on step through gait.  SBA/mod. I on R to ensure R knee mechanism locks out before wt. Bearing.  No verbal cuing required and consistent gait pattern/ step pattern.  PT adjusted/tightened fit several times.  BP check: 158/85.  HR: 88 bpm.       Stair training with B handrails forward on ascent and descent.  Step to gait pattern with heavy UE assist (4 steps x 2)- seated rest break required due to fatigue/shoulder discomfort.  Sit to stands 4x with  use of arm rests/ RW for support (mod. I and extra time required).     TUG: 48.9 sec. (Requires UE assist on arm rests and extra time to initiate movement after standing).    Walking in PT gym with use of RW (1.5 laps) with consistent recip. Gait pattern.  After a short seated rest break pt. Walked from gym outside at side of building with RW/ SBA for safety, esp. Clearing the door threshold.  Pt. Walked 78 feet and required seated rest break then amb. 52 feet prior to sitting in w/c.  Pt. Challenged on ramp and slope on sidewalk with no LOB but extra time/ heavy UE assist on RW required.    PATIENT EDUCATION:  Education details: HEP/ gait and prosthetic training.  Person educated: Patient and Spouse Education method: Explanation, Demonstration, and Verbal cues Education comprehension: verbalized understanding, returned demonstration, and tactile cues required   HOME EXERCISE PROGRAM: Prone  position hip stretches/ Standing marching at kitchen counter with w/c behind patient.      ASSESSMENT:  CLINICAL IMPRESSION:    Highly motivated to become more independent with use of RW as well as increase gait on outside terrain/ sidewalks.  Pt. continues to ambulate with good BOS/step pattern with no episodes of R knee mechanism buckling this session. Session focused on standing endurance/ mod. I with stairs and upright posture with RW.  Pt. Remains motivated to be able to ascend/descends stairs at beach in approximately a month. No residual limb discomfort or pinching today.  Pts. Prosthetic leg continues to fit well which allows for improved gait/ standing tolerance.  Patient will continue to benefit from skilled PT services to improve standing tolerance/ gait independence and ADL.  OBJECTIVE IMPAIRMENTS Abnormal gait, decreased activity tolerance, decreased balance, decreased endurance, decreased mobility, difficulty walking, decreased ROM, decreased strength, decreased safety awareness, impaired flexibility, improper body mechanics, prosthetic dependency , and pain.   ACTIVITY LIMITATIONS carrying, lifting, standing, squatting, stairs, transfers, bed mobility, bathing, toileting, dressing, hygiene/grooming, and locomotion level  PARTICIPATION LIMITATIONS: cleaning, laundry, driving, shopping, community activity, and yard work  PERSONAL FACTORS Past/current experiences are also affecting patient's functional outcome.   REHAB POTENTIAL: Good  CLINICAL DECISION MAKING: Evolving/moderate complexity  EVALUATION COMPLEXITY: High   GOALS: Goals reviewed with patient Yes  LONG TERM GOALS: Target date: 11/02/22  Pt. Will increase FOTO to 54 to improve functional mobility.  Baseline: initial FOTO 46.  11/2: 50.  4/25: 60  Goal status: Goal met  2.  Pt. Able to tolerate standing 10 minutes while pulling up pants with mod. I safely while wearing prosthetic leg to improve daily activities.   Baseline:  limited standing tolerance Goal status: Partially met  3.  Pt. Will ambulate 200 feet with use of least assistive device and mod. I to promote safety with household tasks/ walking into bathroom.   Baseline:  amb. With RW and min. A for safety.  2/27: walking in clinic with SBA/mod. I for 100 feet with RW and use of prosthetic leg.  6/11: amb. With RW or rollator <100 feet with SBA for RW/ CGA for rollator. 7/18: 120' with SBA with RW/CGA for rollator Goal status: Ongoing  4.  Pt. Will ascend 10 stairs with use of single forearm crutch/ handrail and step to pattern with mod. I safely.  Baseline:  see above; 7/18: ascends 4 stairs with step to pattern, with B rails, and CGA Goal status: Ongoing  PLAN: PT FREQUENCY: 2x/week  PT DURATION: 8 weeks  PLANNED INTERVENTIONS: Therapeutic exercises, Therapeutic activity, Neuromuscular re-education, Balance training, Gait training, Patient/Family education, Self Care, Joint mobilization, Prosthetic training, DME instructions, and Manual therapy  PLAN FOR NEXT SESSION:  Progress gait/ standing tolerance.  F/u on upright rollator.  FOCUS ON STAIRS to prepare for beach trip in September (14 stairs to enter house).      Cammie Mcgee, PT, DPT # [phone] Physical Therapist - [place]  [facility]  8:25 AM,09/21/22

## 2022-09-22 ENCOUNTER — Encounter: Payer: Self-pay | Admitting: Physical Therapy

## 2022-09-22 ENCOUNTER — Ambulatory Visit: Payer: Medicare Other | Admitting: Physical Therapy

## 2022-09-22 DIAGNOSIS — M6281 Muscle weakness (generalized): Secondary | ICD-10-CM

## 2022-09-22 DIAGNOSIS — R293 Abnormal posture: Secondary | ICD-10-CM

## 2022-09-22 DIAGNOSIS — Z89611 Acquired absence of right leg above knee: Secondary | ICD-10-CM

## 2022-09-22 DIAGNOSIS — R269 Unspecified abnormalities of gait and mobility: Secondary | ICD-10-CM

## 2022-09-22 NOTE — Therapy (Signed)
OUTPATIENT PHYSICAL THERAPY LOWER EXTREMITY TREATMENT  Physical Therapy Progress Note  Dates of reporting period  06/23/22   to   09/22/22   Patient Name: Jasmine Morrow MRN: [id number] DOB:08-30-1945, 77 y.o., female Today's Date: 09/22/2022   PT End of Session - 09/22/22 0865     Visit Number 80    Number of Visits 92    Date for PT Re-Evaluation 11/02/22    PT Start Time 0928    PT Stop Time 1020    PT Time Calculation (min) 52 min    Activity Tolerance Patient tolerated treatment well    Behavior During Therapy [facility] for tasks assessed/performed             Past Medical History:  Diagnosis Date   Anemia    Anxiety    Asthma    seasonal    GERD (gastroesophageal reflux disease)    H/O above knee amputation, 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   Hypertension    PONV (postoperative nausea and vomiting)    Seasonal allergies    Sleep apnea    does not uses CPAP machine anymore    Wheelchair dependent    able to self transfer   Past Surgical History:  Procedure Laterality Date   APPENDECTOMY     CATARACT EXTRACTION W/PHACO Right 02/28/2022   Procedure: CATARACT EXTRACTION PHACO AND INTRAOCULAR LENS PLACEMENT (IOC) RIGHT  4.82  00:30.9;  Surgeon: Nevada Crane, MD;  Location: [facility];  Service: Ophthalmology;  Laterality: Right;  sleep apnea   CATARACT EXTRACTION W/PHACO Left 03/14/2022   Procedure: CATARACT EXTRACTION PHACO AND INTRAOCULAR LENS PLACEMENT (IOC) LEFT;  Surgeon: Nevada Crane, MD;  Location: [facility];  Service: Ophthalmology;  Laterality: Left;  4.32 0:39.7   CESAREAN SECTION     CHOLECYSTECTOMY     DILATION AND CURETTAGE OF UTERUS     ESOPHAGOGASTRODUODENOSCOPY N/A 02/22/2019   Procedure: ESOPHAGOGASTRODUODENOSCOPY (EGD);  Surgeon: Toledo, Boykin Nearing, MD;  Location: [facility];  Service: Gastroenterology;  Laterality: N/A;   FLEXIBLE SIGMOIDOSCOPY N/A 02/22/2019   Procedure: FLEXIBLE SIGMOIDOSCOPY;  Surgeon: Toledo, Boykin Nearing,  MD;  Location: [facility];  Service: Gastroenterology;  Laterality: N/A;   HARDWARE REMOVAL Right 11/12/2019   Procedure: Right ankle hardware removal;  Surgeon: Kennedy Bucker, MD;  Location: [facility];  Service: Orthopedics;  Laterality: Right;   HARDWARE REMOVAL Right 04/17/2020   Procedure: HARDWARE REMOVAL FROM RIGHT ANKLE AND RIGHT KNEE; IMPLANT OF CEMENT SPACERS IN RIGHT KNEE;  Surgeon: Donato Heinz, MD;  Location: [facility];  Service: Orthopedics;  Laterality: Right;   JOINT REPLACEMENT     LEG AMPUTATION ABOVE KNEE Right 12/02/2020   [facility]   ORIF ANKLE FRACTURE Right 03/14/2019   Procedure: OPEN REDUCTION INTERNAL FIXATION (ORIF) ANKLE FRACTURE, MEDIAL MALLEOLUS;  Surgeon: Kennedy Bucker, MD;  Location: [facility];  Service: Orthopedics;  Laterality: Right;   ORIF ANKLE FRACTURE Right 02/08/2019   Procedure: OPEN REDUCTION INTERNAL FIXATION (ORIF) ANKLE FRACTURE, post malleolus;  Surgeon: Kennedy Bucker, MD;  Location: [facility];  Service: Orthopedics;  Laterality: Right;   PARTIAL HYSTERECTOMY  1993   REPLACEMENT TOTAL KNEE Bilateral 2008   2009   SCAR DEBRIDEMENT OF TOTAL KNEE  01/2020   SYNDESMOSIS REPAIR Right 02/08/2019   Procedure: SYNDESMOSIS REPAIR;  Surgeon: Kennedy Bucker, MD;  Location: [facility];  Service: Orthopedics;  Laterality: Right;   TEE WITHOUT CARDIOVERSION N/A 02/26/2019   Procedure: TRANSESOPHAGEAL ECHOCARDIOGRAM (TEE);  Surgeon: Lamar Blinks, MD;  Location:  [facility];  Service: Cardiovascular;  Laterality: N/A;   TOTAL KNEE REVISION WITH SCAR DEBRIDEMENT/PATELLA REVISION WITH POLY EXCHANGE Right 01/24/2020   Procedure: TOTAL KNEE REVISION WITH SCAR DEBRIDEMENT/PATELLA REVISION WITH POLY EXCHANGE;  Surgeon: Donato Heinz, MD;  Location: [facility];  Service: Orthopedics;  Laterality: Right;   TUMOR REMOVAL     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   Patient Active Problem List   Diagnosis Date Noted   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    Anxiety 04/17/2020   History of GI diverticular bleed 04/17/2020   Chest pain 04/17/2020   PAD (peripheral artery disease) (HCC) 04/05/2020   Ulcer of right leg (HCC) 04/05/2020   COPD (chronic obstructive pulmonary disease) (HCC) 04/05/2020   GERD (gastroesophageal reflux disease) 04/05/2020   Abscess of right knee 01/22/2020   Pressure injury of skin 02/28/2019   SOB (shortness of breath)    Gastrointestinal hemorrhage    Sepsis (HCC)    Symptomatic anemia 02/20/2019   Trimalleolar fracture of ankle, closed, right, initial encounter 02/07/2019   Multiple lung nodules 03/31/2014   Extrinsic asthma 03/27/2014   OSA on CPAP 03/27/2014    PCP: Danella Penton, MD  REFERRING PROVIDER: Danella Penton, MD  REFERRING DIAG: Above knee amputation of right lower extremity  THERAPY DIAG:  Gait difficulty  S/P AKA (above knee amputation) unilateral, right (HCC)  Muscle weakness (generalized)  Abnormal posture  Rationale for Evaluation and Treatment Rehabilitation  ONSET DATE: 12/02/20  SUBJECTIVE:  EVALUATION  PERTINENT HISTORY: Patient Profile:   Jasmine Morrow is a 77 y.o. female Chief Complaint  Patient presents with  Visit Follow Up  Doing well.   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 Cardiac cath 10/2011 showed completely normal coronary arteries, however she did have mild left  ventricular dysfunction with anterior wall motion abnormality. Left ventricular EF was 51%.  History of chickenpox  Hyperlipidemia  patient states she has never been diagnosed with this  Hypertension  Knee joint replacement by other means  Macular degeneration  Major depressive disorder, recurrent, mild (CMS-HCC)  Medicare annual wellness visit, initial 08/09/2019  7/21  Multiple thyroid nodules  Normal coronary arteries 09/06/2013  Normal coronary anatomy by cardiac catheterization 10/12/11  Osteoarthritis  Ovarian cyst  PONV (postoperative nausea and vomiting)  nausea  Postoperative urinary retention 12/08/2020  Rash on lips 12/21/2020  Seasonal allergies  Sinusitis, unspecified  Sleep apnea  On CPAP.  Slow transit constipation 12/10/2020  Trimalleolar fracture of ankle, closed, right, initial encounter 02/07/2019  Complicated by staph infection 1/21, 4 weeks IV Ancef, Menz  Ulcer   Past Surgical History:  Procedure Laterality Date  SALPINGO OOPHORECTOMY Bilateral 1993  Right total knee arthroplasty 10/11/2006  Dr Ernest Pine  Left total knee arthroplasty 10/12/2007  Dr Ernest Pine  COLONOSCOPY 07/30/2012  internal hemorrhoids, diverticulosis  ANKLE ARTHODESIS W/ ARTHROSCOPY Right 02/08/2019  03/14/2019 second surgery  COLONOSCOPY 02/22/2019  Blood in entire colon/Diverticulosis - Presumed diverticular  bleed. No repeat recommended per TKT.  EGD 02/22/2019  Gastritis/gastric ulcers/Hiatal hernia/Otherwise normal - no repeat recommended per TKT.  ORIF ANKLE FRACTURE Right 03/14/2019  Dr. Rosita Kea  REMOVAL HARDWARE ANKLE FOOT/TOES Right 11/12/2019  Dr. Rosita Kea  Right knee arthrotomy, irrigation and debridement of the right knee, polyethylene exchange 01/24/2020  Dr Ernest Pine  Removal of hardware (plates and screws) from the right ankle with irrigation, debridement, and placement of Stimulan antibiotic beads 04/17/2020  Dr Ernest Pine  Right knee arthrotomy, extensive irrigation debridement,  removal of right total knee implants, and placement of antibiotic impregnated polymethylmethacrylate cement spacer 04/17/2020  Dr Ernest Pine  AMPUTATION LEG ABOVE KNEE AKA Right 12/02/2020  Procedure: AMPUTATION, THIGH, THROUGH FEMUR, ANY LEVEL; Surgeon: Starr Lake, MD; Location: [facility]; Service: Orthopedics; Laterality: Right;  TRANSFER ADJACENT TISSUE LEG Right 12/02/2020  Procedure: ADJACENT TISSUE TRANSFER OR REARRANGEMENT, LEG; DEFECT 10 SQ CM OR LESS; Surgeon: Starr Lake, MD; Location: [facility]; Service: Orthopedics; Laterality: Right;  ASPIRATION/INJECTION MAJOR JOINT/BURSA KNEE Left 12/02/2020  Procedure: ARTHROCENTESIS, ASPIRATION AND/OR INJECTION, MAJOR JOINT OR BURSA, KNEE; WITHOUT ULTRASOUND GUIDANCE; Surgeon: Starr Lake, MD; Location: [facility]; Service: Orthopedics; Laterality: Left;  REMOVAL KNEE PROSTHESIS W/POSSIBLE PLACEMENT SPACER Left 01/18/2021  Procedure: REMOVAL OF PROSTHESIS, INCLUDING TOTAL KNEE PROSTHESIS, METHYLMETHACRYLATE WITH OR WITHOUT INSERTION OF SPACER, KNEE; Surgeon: Celine Mans, MD; Location: [facility]; Service: Orthopedics; Laterality: Left;  INSERTION NON-BIODEGRADABLE DRUG DELIVERY IMPLANT Left 01/18/2021  Procedure: INSERTION, KNEE, bioresorbable, biodegradable, NON-BIODEGRADABLE DRUG DELIVERY IMPLANT; Surgeon: Celine Mans, MD; Location: [facility]; Service: Orthopedics; Laterality: Left;  ABOVE KNEE LEG AMPUTATION  12/02/2020  APPENDECTOMY Est 1968  CESAREAN SECTION  CHOLECYSTECTOMY  FRACTURE SURGERY 1/21  HYSTERECTOMY partial  JOINT REPLACEMENT 2008 & 2009  TUBAL LIGATION 3/79   ALLERGIES: Allergies  Allergen Reactions  Singulair [Montelukast] Other (See Comments)  Gland swelling  Sulfa (Sulfonamide Antibiotics) Swelling  Oxycodone Dizziness and Other (See Comments)  Vancomycin Analogues Other (See Comments)  Ringing in ears  Avinza [Morphine] Itching  Darvocet A500  [Propoxyphene N-Acetaminophen] Nausea  Nickel Rash  Ultracet [Tramadol-Acetaminophen] Rash  Vicodin [Hydrocodone-Acetaminophen] Vomiting  Vioxx [Rofecoxib] Other (See Comments)  GI   CURRENT MEDICATIONS: Current Outpatient Medications: acetaminophen (TYLENOL) 500 MG tablet, Take 1,000 mg by mouth 3 (three) times a day Twice daily per patient., PRN Not Currently Taking aspirin 81 MG EC tablet, Take 1 tablet (81 mg total) by mouth at bedtime, Taking calcium carbonate-vitamin D3 (CALTRATE 600+D) 600 mg-10 mcg (400 unit) tablet, Take 0.5 tablets by mouth 2 (two) times daily, Taking cetirizine (ZYRTEC) 10 mg capsule, Take 1 capsule (10 mg total) by mouth once daily, Taking escitalopram oxalate (LEXAPRO) 10 MG tablet, Take 1 tablet (10 mg total) by mouth once daily, Taking estradioL (ESTRACE) 0.01 % (0.1 mg/gram) vaginal cream, Place 2 g vaginally twice a week, Taking folic acid/multivit,iron,miner (MULTIVIT-IRON-MIN-FOLIC ACID ORAL), Take 1 tablet by mouth once daily, Taking gabapentin (NEURONTIN) 300 MG capsule, Take 1 capsule (300 mg total) by mouth 3 (three) times daily Phantom limb pain, Taking melatonin 3 mg tablet, Take 1 tablet (3 mg total) by mouth at bedtime, Taking multivitamin with iron (COMPLETE MULTIVITAMIN-MINERAL) tablet, Take 1 tablet by mouth, Taking multivitamin with minerals, EYE, (PRESERVISION AREDS 2) soft gel capsule, Take 1 capsule by mouth 2 (two) times daily, Taking nystatin (MYCOSTATIN) 100,000 unit/gram powder, Apply small amount topically twice daily for rash under breasts, PRN Not Currently Taking pantoprazole (PROTONIX) 40 MG DR tablet, Take 1 tablet (40  mg total) by mouth 2 (two) times daily. Please call to schedule an appt. Thanks!, Taking polyethylene glycol (MIRALAX) powder, Take 17 g by mouth once daily Mix in 4-8ounces of fluid prior to taking., Taking sennosides-docusate (SENOKOT-S) 8.6-50 mg tablet, Take 1 tablet by mouth 2 (two) times daily For constipation,  Taking lactose-reduced food (ENSURE PLUS ORAL), Take 237 mLs by mouth 2 (two) times daily With breakfast and dinner  HPI   CLINICAL SUMMARY:  Patient post right AKA and working with prosthesis. 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 Able to do housework, improving  PAIN:  Are you having pain? Yes: NPRS scale: 5/10 Pain location: L knee Pain description: aching Aggravating factors: walking Relieving factors: rest  PRECAUTIONS: None  WEIGHT BEARING RESTRICTIONS No  FALLS:  Has patient fallen in last 6 months? No  LIVING ENVIRONMENT: Lives with: lives with their spouse Lives in: House/apartment Stairs: No Has following equipment at home: Dan Humphreys - 4 wheeled and Wheelchair (manual)  OCCUPATION: retired  PLOF: Requires assistive device for independence  PATIENT GOALS  improve standing tolerance/ pull up pants/ ambulate with SPC safely.    OBJECTIVE:   PATIENT SURVEYS:  FOTO initial 46/ goal 19.   11/2: 50  COGNITION:  Overall cognitive status: Within functional limits for tasks assessed     SENSATION: WFL  POSTURE: rounded shoulders, forward head, flexed trunk , and weight shift left  PALPATION: No tenderness along R distal residual limb.  Discussed phantom limb sensation.    LOWER EXTREMITY ROM:  B LE AROM WFL except L knee extension secondary to articulating spacer.  Did not assess L/R hip extension at this time.    LOWER EXTREMITY MMT:  MMT Right eval Left eval  Hip flexion 4/5 4/5  Hip extension    Hip abduction 4+/5 4+/5  Hip adduction 4+/5 4+/5  Hip internal rotation    Hip external rotation    Knee flexion N/A 4+/5  Knee extension N/A 4+/5  Ankle dorsiflexion  5/5  Ankle plantarflexion    Ankle inversion    Ankle eversion     (Blank rows = not tested)  GAIT: Distance walked: in gym/ //-bars Assistive device utilized: Environmental consultant - 2 wheeled Level of assistance: CGA Comments: Cuing for posture correction with  mirror feedback.  Flexed posture/ heavy UE assist.    TUG: UE assist required to stand/ use of RW.  51.9 seconds.  8/20: TUG: 48.9 sec. (Requires UE assist on arm rests and extra time to initiate movement after standing).   TODAY'S TREATMENT:  09/22/2022  Subjective:  Pt. Reports no pain.  Pt. Has plans to go out of dinner this weekend with family and attend church.  Pt. Excited about going to beach in a few weeks with friends from church.  Pt. States she hasn't been able to attend beach week for past several years due functional limitations.     Objective:   There.ex.:  No charge   Nustep L5 10+ mins (0.5 miles) B UE/LE (consistent cadence/ no residual limb pain or tenderness reported).     Seated marching 20x/ sit to stands from gray chair 5x.                Gait training:    Amb. From blue mat table to Nustep with use of RW and focus on step through gait.  SBA/mod. I on R to ensure R knee mechanism locks out before wt. Bearing.  No verbal cuing required  and consistent gait pattern/ step pattern.  PT adjusted/tightened fit several times.       Walking forward/backwards in //-bars with B UE assist/ mirror feedback 3x.    Walking in PT gym with use of RW (1.5 laps) with consistent recip. Gait pattern.  After a short seated rest break pt. Walked from gym outside at side of building with RW/ SBA for safety, esp. Clearing the door threshold.  Pt. Walked 95 feet and required seated rest break then amb. 72 feet prior to passenger side of car.  Pt. Challenged on ramp and slope on sidewalk with no LOB but extra time/ heavy UE assist on RW required.    No stairs today.    PATIENT EDUCATION:  Education details: HEP/ gait and prosthetic training.  Person educated: Patient and Spouse Education method: Explanation, Demonstration, and Verbal cues Education comprehension: verbalized understanding, returned demonstration, and tactile cues required   HOME EXERCISE PROGRAM: Prone position hip  stretches/ Standing marching at kitchen counter with w/c behind patient.      ASSESSMENT:  CLINICAL IMPRESSION:    Highly motivated to become more independent with use of RW as well as increase gait on outside terrain/ sidewalks.  Pt. continues to ambulate with good BOS/step pattern with no episodes of R knee mechanism buckling this session. Session focused on walking endurance and being able to control knee mechanism/ upright posture with RW.  Pt. Remains motivated to be able to ascend/descends stairs at beach in approximately a month. No residual limb discomfort or pinching today.  Pts. Prosthetic leg continues to fit well which allows for improved gait/ standing tolerance.  Patient will continue to benefit from skilled PT services to improve standing tolerance/ gait independence and ADL.  OBJECTIVE IMPAIRMENTS Abnormal gait, decreased activity tolerance, decreased balance, decreased endurance, decreased mobility, difficulty walking, decreased ROM, decreased strength, decreased safety awareness, impaired flexibility, improper body mechanics, prosthetic dependency , and pain.   ACTIVITY LIMITATIONS carrying, lifting, standing, squatting, stairs, transfers, bed mobility, bathing, toileting, dressing, hygiene/grooming, and locomotion level  PARTICIPATION LIMITATIONS: cleaning, laundry, driving, shopping, community activity, and yard work  PERSONAL FACTORS Past/current experiences are also affecting patient's functional outcome.   REHAB POTENTIAL: Good  CLINICAL DECISION MAKING: Evolving/moderate complexity  EVALUATION COMPLEXITY: High   GOALS: Goals reviewed with patient Yes  LONG TERM GOALS: Target date: 11/02/22  Pt. Will increase FOTO to 54 to improve functional mobility.  Baseline: initial FOTO 46.  11/2: 50.  4/25: 60  Goal status: Goal met  2.  Pt. Able to tolerate standing 10 minutes while pulling up pants with mod. I safely while wearing prosthetic leg to improve daily  activities.  Baseline:  limited standing tolerance Goal status: Partially met  3.  Pt. Will ambulate 200 feet with use of least assistive device and mod. I to promote safety with household tasks/ walking into bathroom.   Baseline:  amb. With RW and min. A for safety.  2/27: walking in clinic with SBA/mod. I for 100 feet with RW and use of prosthetic leg.  6/11: amb. With RW or rollator <100 feet with SBA for RW/ CGA for rollator. 7/18: 120' with SBA with RW/CGA for rollator Goal status: Ongoing  4.  Pt. Will ascend 10 stairs with use of single forearm crutch/ handrail and step to pattern with mod. I safely.  Baseline:  see above; 7/18: ascends 4 stairs with step to pattern, with B rails, and CGA Goal status: Ongoing  PLAN: PT FREQUENCY: 2x/week  PT  DURATION: 8 weeks  PLANNED INTERVENTIONS: Therapeutic exercises, Therapeutic activity, Neuromuscular re-education, Balance training, Gait training, Patient/Family education, Self Care, Joint mobilization, Prosthetic training, DME instructions, and Manual therapy  PLAN FOR NEXT SESSION:  Progress gait/ standing tolerance.  F/u on upright rollator.  FOCUS ON STAIRS to prepare for beach trip in September (14 stairs to enter house).      Cammie Mcgee, PT, DPT # [phone] Physical Therapist - [place]  [facility]  11:09 AM,09/22/22

## 2022-09-27 ENCOUNTER — Ambulatory Visit: Payer: Medicare Other | Admitting: Physical Therapy

## 2022-09-29 ENCOUNTER — Ambulatory Visit: Payer: Medicare Other | Admitting: Physical Therapy

## 2022-09-29 DIAGNOSIS — R269 Unspecified abnormalities of gait and mobility: Secondary | ICD-10-CM | POA: Diagnosis not present

## 2022-09-29 DIAGNOSIS — M6281 Muscle weakness (generalized): Secondary | ICD-10-CM

## 2022-09-29 DIAGNOSIS — Z89611 Acquired absence of right leg above knee: Secondary | ICD-10-CM

## 2022-09-29 DIAGNOSIS — R293 Abnormal posture: Secondary | ICD-10-CM

## 2022-10-01 NOTE — Therapy (Signed)
OUTPATIENT PHYSICAL THERAPY LOWER EXTREMITY TREATMENT   Patient Name: Jasmine Morrow MRN: [id number] DOB:05-16-1945, 77 y.o., female Today's Date: 09/29/2022   PT End of Session - 10/01/22 0742     Visit Number 81    Number of Visits 92    Date for PT Re-Evaluation 11/02/22    PT Start Time 0932    PT Stop Time 1030    PT Time Calculation (min) 58 min    Activity Tolerance Patient tolerated treatment well    Behavior During Therapy WFL for tasks assessed/performed             Past Medical History:  Diagnosis Date   Anemia    Anxiety    Asthma    seasonal    GERD (gastroesophageal reflux disease)    H/O above knee amputation, 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   Hypertension    PONV (postoperative nausea and vomiting)    Seasonal allergies    Sleep apnea    does not uses CPAP machine anymore    Wheelchair dependent    able to self transfer   Past Surgical History:  Procedure Laterality Date   APPENDECTOMY     CATARACT EXTRACTION W/PHACO Right 02/28/2022   Procedure: CATARACT EXTRACTION PHACO AND INTRAOCULAR LENS PLACEMENT (IOC) RIGHT  4.82  00:30.9;  Surgeon: Jasmine Crane, MD;  Location: [facility];  Service: Ophthalmology;  Laterality: Right;  sleep apnea   CATARACT EXTRACTION W/PHACO Left 03/14/2022   Procedure: CATARACT EXTRACTION PHACO AND INTRAOCULAR LENS PLACEMENT (IOC) LEFT;  Surgeon: Jasmine Crane, MD;  Location: [facility];  Service: Ophthalmology;  Laterality: Left;  4.32 0:39.7   CESAREAN SECTION     CHOLECYSTECTOMY     DILATION AND CURETTAGE OF UTERUS     ESOPHAGOGASTRODUODENOSCOPY N/A 02/22/2019   Procedure: ESOPHAGOGASTRODUODENOSCOPY (EGD);  Surgeon: Jasmine Morrow, Jasmine Nearing, MD;  Location: [facility];  Service: Gastroenterology;  Laterality: N/A;   FLEXIBLE SIGMOIDOSCOPY N/A 02/22/2019   Procedure: FLEXIBLE SIGMOIDOSCOPY;  Surgeon: Jasmine Morrow, Jasmine Nearing, MD;  Location: [facility];  Service: Gastroenterology;  Laterality: N/A;   HARDWARE  REMOVAL Right 11/12/2019   Procedure: Right ankle hardware removal;  Surgeon: Jasmine Bucker, MD;  Location: [facility];  Service: Orthopedics;  Laterality: Right;   HARDWARE REMOVAL Right 04/17/2020   Procedure: HARDWARE REMOVAL FROM RIGHT ANKLE AND RIGHT KNEE; IMPLANT OF CEMENT SPACERS IN RIGHT KNEE;  Surgeon: Jasmine Heinz, MD;  Location: [facility];  Service: Orthopedics;  Laterality: Right;   JOINT REPLACEMENT     LEG AMPUTATION ABOVE KNEE Right 12/02/2020   [facility]   ORIF ANKLE FRACTURE Right 03/14/2019   Procedure: OPEN REDUCTION INTERNAL FIXATION (ORIF) ANKLE FRACTURE, MEDIAL MALLEOLUS;  Surgeon: Jasmine Bucker, MD;  Location: [facility];  Service: Orthopedics;  Laterality: Right;   ORIF ANKLE FRACTURE Right 02/08/2019   Procedure: OPEN REDUCTION INTERNAL FIXATION (ORIF) ANKLE FRACTURE, post malleolus;  Surgeon: Jasmine Bucker, MD;  Location: [facility];  Service: Orthopedics;  Laterality: Right;   PARTIAL HYSTERECTOMY  1993   REPLACEMENT TOTAL KNEE Bilateral 2008   2009   SCAR DEBRIDEMENT OF TOTAL KNEE  01/2020   SYNDESMOSIS REPAIR Right 02/08/2019   Procedure: SYNDESMOSIS REPAIR;  Surgeon: Jasmine Bucker, MD;  Location: [facility];  Service: Orthopedics;  Laterality: Right;   TEE WITHOUT CARDIOVERSION N/A 02/26/2019   Procedure: TRANSESOPHAGEAL ECHOCARDIOGRAM (TEE);  Surgeon: Jasmine Blinks, MD;  Location: [facility];  Service: Cardiovascular;  Laterality: N/A;   TOTAL KNEE REVISION WITH SCAR DEBRIDEMENT/PATELLA REVISION WITH  POLY EXCHANGE Right 01/24/2020   Procedure: TOTAL KNEE REVISION WITH SCAR DEBRIDEMENT/PATELLA REVISION WITH POLY EXCHANGE;  Surgeon: Jasmine Heinz, MD;  Location: [facility];  Service: Orthopedics;  Laterality: Right;   TUMOR REMOVAL     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   Patient Active Problem List   Diagnosis Date Noted   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   Anxiety 04/17/2020   History of GI diverticular bleed 04/17/2020   Chest pain 04/17/2020    PAD (peripheral artery disease) (HCC) 04/05/2020   Ulcer of right leg (HCC) 04/05/2020   COPD (chronic obstructive pulmonary disease) (HCC) 04/05/2020   GERD (gastroesophageal reflux disease) 04/05/2020   Abscess of right knee 01/22/2020   Pressure injury of skin 02/28/2019   SOB (shortness of breath)    Gastrointestinal hemorrhage    Sepsis (HCC)    Symptomatic anemia 02/20/2019   Trimalleolar fracture of ankle, closed, right, initial encounter 02/07/2019   Multiple lung nodules 03/31/2014   Extrinsic asthma 03/27/2014   OSA on CPAP 03/27/2014    PCP: Jasmine Penton, MD  REFERRING PROVIDER: Danella Penton, MD  REFERRING DIAG: Above knee amputation of right lower extremity  THERAPY DIAG:  Gait difficulty  S/P AKA (above knee amputation) unilateral, right (HCC)  Muscle weakness (generalized)  Abnormal posture  Rationale for Evaluation and Treatment Rehabilitation  ONSET DATE: 12/02/20  SUBJECTIVE:  EVALUATION  PERTINENT HISTORY: Patient Profile:   Jasmine Morrow is a 77 y.o. female Chief Complaint  Patient presents with  Visit Follow Up  Doing well.   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 Cardiac cath 10/2011 showed completely normal coronary arteries, however she did have mild left ventricular dysfunction with anterior wall motion abnormality. Left ventricular EF was 51%.   History of chickenpox  Hyperlipidemia  patient states she has never been diagnosed with this  Hypertension  Knee joint replacement by other means  Macular degeneration  Major depressive disorder, recurrent, mild (CMS-HCC)  Medicare annual wellness visit, initial 08/09/2019  7/21  Multiple thyroid nodules  Normal coronary arteries 09/06/2013  Normal coronary anatomy by cardiac catheterization 10/12/11  Osteoarthritis  Ovarian cyst  PONV (postoperative nausea and vomiting)  nausea  Postoperative urinary retention 12/08/2020  Rash on lips 12/21/2020  Seasonal allergies  Sinusitis, unspecified  Sleep apnea  On CPAP.  Slow transit constipation 12/10/2020  Trimalleolar fracture of ankle, closed, right, initial encounter 02/07/2019  Complicated by staph infection 1/21, 4 weeks IV Ancef, Menz  Ulcer   Past Surgical History:  Procedure Laterality Date  SALPINGO OOPHORECTOMY Bilateral 1993  Right total knee arthroplasty 10/11/2006  Dr Ernest Pine  Left total knee arthroplasty 10/12/2007  Dr Ernest Pine  COLONOSCOPY 07/30/2012  internal hemorrhoids, diverticulosis  ANKLE ARTHODESIS W/ ARTHROSCOPY Right 02/08/2019  03/14/2019 second surgery  COLONOSCOPY 02/22/2019  Blood in entire colon/Diverticulosis - Presumed diverticular bleed. No repeat recommended per TKT.  EGD 02/22/2019  Gastritis/gastric ulcers/Hiatal hernia/Otherwise normal - no repeat recommended  per TKT.  ORIF ANKLE FRACTURE Right 03/14/2019  Dr. Rosita Kea  REMOVAL HARDWARE ANKLE FOOT/TOES Right 11/12/2019  Dr. Rosita Kea  Right knee arthrotomy, irrigation and debridement of the right knee, polyethylene exchange 01/24/2020  Dr Ernest Pine  Removal of hardware (plates and screws) from the right ankle with irrigation, debridement, and placement of Stimulan antibiotic beads 04/17/2020  Dr Ernest Pine  Right knee arthrotomy, extensive irrigation debridement, removal of right total knee implants, and placement of antibiotic impregnated  polymethylmethacrylate cement spacer 04/17/2020  Dr Ernest Pine  AMPUTATION LEG ABOVE KNEE AKA Right 12/02/2020  Procedure: AMPUTATION, THIGH, THROUGH FEMUR, ANY LEVEL; Surgeon: Starr Lake, MD; Location: [facility]; Service: Orthopedics; Laterality: Right;  TRANSFER ADJACENT TISSUE LEG Right 12/02/2020  Procedure: ADJACENT TISSUE TRANSFER OR REARRANGEMENT, LEG; DEFECT 10 SQ CM OR LESS; Surgeon: Starr Lake, MD; Location: [facility]; Service: Orthopedics; Laterality: Right;  ASPIRATION/INJECTION MAJOR JOINT/BURSA KNEE Left 12/02/2020  Procedure: ARTHROCENTESIS, ASPIRATION AND/OR INJECTION, MAJOR JOINT OR BURSA, KNEE; WITHOUT ULTRASOUND GUIDANCE; Surgeon: Starr Lake, MD; Location: [facility]; Service: Orthopedics; Laterality: Left;  REMOVAL KNEE PROSTHESIS W/POSSIBLE PLACEMENT SPACER Left 01/18/2021  Procedure: REMOVAL OF PROSTHESIS, INCLUDING TOTAL KNEE PROSTHESIS, METHYLMETHACRYLATE WITH OR WITHOUT INSERTION OF SPACER, KNEE; Surgeon: Celine Mans, MD; Location: [facility]; Service: Orthopedics; Laterality: Left;  INSERTION NON-BIODEGRADABLE DRUG DELIVERY IMPLANT Left 01/18/2021  Procedure: INSERTION, KNEE, bioresorbable, biodegradable, NON-BIODEGRADABLE DRUG DELIVERY IMPLANT; Surgeon: Celine Mans, MD; Location: [facility]; Service: Orthopedics; Laterality: Left;  ABOVE KNEE LEG AMPUTATION  12/02/2020  APPENDECTOMY Est 1968  CESAREAN SECTION  CHOLECYSTECTOMY  FRACTURE SURGERY 1/21  HYSTERECTOMY partial  JOINT REPLACEMENT 2008 & 2009  TUBAL LIGATION 3/79   ALLERGIES: Allergies  Allergen Reactions  Singulair [Montelukast] Other (See Comments)  Gland swelling  Sulfa (Sulfonamide Antibiotics) Swelling  Oxycodone Dizziness and Other (See Comments)  Vancomycin Analogues Other (See Comments)  Ringing in ears  Avinza [Morphine] Itching  Darvocet A500 [Propoxyphene N-Acetaminophen] Nausea  Nickel Rash  Ultracet  [Tramadol-Acetaminophen] Rash  Vicodin [Hydrocodone-Acetaminophen] Vomiting  Vioxx [Rofecoxib] Other (See Comments)  GI   CURRENT MEDICATIONS: Current Outpatient Medications: acetaminophen (TYLENOL) 500 MG tablet, Take 1,000 mg by mouth 3 (three) times a day Twice daily per patient., PRN Not Currently Taking aspirin 81 MG EC tablet, Take 1 tablet (81 mg total) by mouth at bedtime, Taking calcium carbonate-vitamin D3 (CALTRATE 600+D) 600 mg-10 mcg (400 unit) tablet, Take 0.5 tablets by mouth 2 (two) times daily, Taking cetirizine (ZYRTEC) 10 mg capsule, Take 1 capsule (10 mg total) by mouth once daily, Taking escitalopram oxalate (LEXAPRO) 10 MG tablet, Take 1 tablet (10 mg total) by mouth once daily, Taking estradioL (ESTRACE) 0.01 % (0.1 mg/gram) vaginal cream, Place 2 g vaginally twice a week, Taking folic acid/multivit,iron,miner (MULTIVIT-IRON-MIN-FOLIC ACID ORAL), Take 1 tablet by mouth once daily, Taking gabapentin (NEURONTIN) 300 MG capsule, Take 1 capsule (300 mg total) by mouth 3 (three) times daily Phantom limb pain, Taking melatonin 3 mg tablet, Take 1 tablet (3 mg total) by mouth at bedtime, Taking multivitamin with iron (COMPLETE MULTIVITAMIN-MINERAL) tablet, Take 1 tablet by mouth, Taking multivitamin with minerals, EYE, (PRESERVISION AREDS 2) soft gel capsule, Take 1 capsule by mouth 2 (two) times daily, Taking nystatin (MYCOSTATIN) 100,000 unit/gram powder, Apply small amount topically twice daily for rash under breasts, PRN Not Currently Taking pantoprazole (PROTONIX) 40 MG DR tablet, Take 1 tablet (40 mg total) by mouth 2 (two) times daily. Please call to schedule an appt. Thanks!, Taking polyethylene glycol (  MIRALAX) powder, Take 17 g by mouth once daily Mix in 4-8ounces of fluid prior to taking., Taking sennosides-docusate (SENOKOT-S) 8.6-50 mg tablet, Take 1 tablet by mouth 2 (two) times daily For constipation, Taking lactose-reduced food (ENSURE PLUS ORAL), Take 237 mLs by  mouth 2 (two) times daily With breakfast and dinner  HPI   CLINICAL SUMMARY:  Patient post right AKA and working with prosthesis. 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 Able to do housework, improving  PAIN:  Are you having pain? Yes: NPRS scale: 5/10 Pain location: L knee Pain description: aching Aggravating factors: walking Relieving factors: rest  PRECAUTIONS: None  WEIGHT BEARING RESTRICTIONS No  FALLS:  Has patient fallen in last 6 months? No  LIVING ENVIRONMENT: Lives with: lives with their spouse Lives in: House/apartment Stairs: No Has following equipment at home: Dan Humphreys - 4 wheeled and Wheelchair (manual)  OCCUPATION: retired  PLOF: Requires assistive device for independence  PATIENT GOALS  improve standing tolerance/ pull up pants/ ambulate with SPC safely.    OBJECTIVE:   PATIENT SURVEYS:  FOTO initial 46/ goal 33.   11/2: 50  COGNITION:  Overall cognitive status: Within functional limits for tasks assessed     SENSATION: WFL  POSTURE: rounded shoulders, forward head, flexed trunk , and weight shift left  PALPATION: No tenderness along R distal residual limb.  Discussed phantom limb sensation.    LOWER EXTREMITY ROM:  B LE AROM WFL except L knee extension secondary to articulating spacer.  Did not assess L/R hip extension at this time.    LOWER EXTREMITY MMT:  MMT Right eval Left eval  Hip flexion 4/5 4/5  Hip extension    Hip abduction 4+/5 4+/5  Hip adduction 4+/5 4+/5  Hip internal rotation    Hip external rotation    Knee flexion N/A 4+/5  Knee extension N/A 4+/5  Ankle dorsiflexion  5/5  Ankle plantarflexion    Ankle inversion    Ankle eversion     (Blank rows = not tested)  GAIT: Distance walked: in gym/ //-bars Assistive device utilized: Environmental consultant - 2 wheeled Level of assistance: CGA Comments: Cuing for posture correction with mirror feedback.  Flexed posture/ heavy UE assist.    TUG: UE assist  required to stand/ use of RW.  51.9 seconds.  8/20: TUG: 48.9 sec. (Requires UE assist on arm rests and extra time to initiate movement after standing).   TODAY'S TREATMENT:  09/29/22  Subjective:  Pt. Reports no pain.  Pt. Did not don prosthetic leg this past weekend.  Pt. Reports increase fatigue today during tx.     Objective:   There.ex.:  No charge   Nustep L5 10+ mins (0.5 miles) B UE/LE (consistent cadence/ no residual limb pain or tenderness reported).     Seated marching 20x at Nustep prior to stairs.                Gait training:    Amb. From blue mat table to Nustep with use of RW and focus on step through gait.  SBA/mod. I on R to ensure R knee mechanism locks out before wt. Bearing.  No verbal cuing required and consistent gait pattern/ step pattern.  PT adjusted/tightened fit several times.       Ascend/ descend 4 stairs 1x with heavy UE assist.  Pt. Limited with moderate fatigue and required seated rest break.  Ambs. From stairs to //-bars (facing mirror)- sit to stands and weight  shifting.     Walking in PT gym with use of RW (1.5 laps) with consistent recip. Gait pattern.  Pt. Only able to amb. From gym to waiting room after tx. Due to moderate overall fatigue/ discomfort in UE.  Pt. Required seated rest break in w/c prior to independently transferring in passenger side of car.      PATIENT EDUCATION:  Education details: HEP/ gait and prosthetic training.  Person educated: Patient and Spouse Education method: Explanation, Demonstration, and Verbal cues Education comprehension: verbalized understanding, returned demonstration, and tactile cues required   HOME EXERCISE PROGRAM: Prone position hip stretches/ Standing marching at kitchen counter with w/c behind patient.      ASSESSMENT:  CLINICAL IMPRESSION:    Pt. continues to ambulate with good BOS/step pattern with no episodes of R knee mechanism buckling this session. Pt. Remains motivated to be able to  ascend/descends stairs at beach in approximately a month. No residual limb discomfort or pinching today.  Pt. Fatigued today which limited standing/ walking tasks during tx. Session.   Pts. Prosthetic leg continues to fit well which allows for improved gait/ standing tolerance.  Patient will continue to benefit from skilled PT services to improve standing tolerance/ gait independence and ADL.  OBJECTIVE IMPAIRMENTS Abnormal gait, decreased activity tolerance, decreased balance, decreased endurance, decreased mobility, difficulty walking, decreased ROM, decreased strength, decreased safety awareness, impaired flexibility, improper body mechanics, prosthetic dependency , and pain.   ACTIVITY LIMITATIONS carrying, lifting, standing, squatting, stairs, transfers, bed mobility, bathing, toileting, dressing, hygiene/grooming, and locomotion level  PARTICIPATION LIMITATIONS: cleaning, laundry, driving, shopping, community activity, and yard work  PERSONAL FACTORS Past/current experiences are also affecting patient's functional outcome.   REHAB POTENTIAL: Good  CLINICAL DECISION MAKING: Evolving/moderate complexity  EVALUATION COMPLEXITY: High   GOALS: Goals reviewed with patient Yes  LONG TERM GOALS: Target date: 11/02/22  Pt. Will increase FOTO to 54 to improve functional mobility.  Baseline: initial FOTO 46.  11/2: 50.  4/25: 60  Goal status: Goal met  2.  Pt. Able to tolerate standing 10 minutes while pulling up pants with mod. I safely while wearing prosthetic leg to improve daily activities.  Baseline:  limited standing tolerance Goal status: Partially met  3.  Pt. Will ambulate 200 feet with use of least assistive device and mod. I to promote safety with household tasks/ walking into bathroom.   Baseline:  amb. With RW and min. A for safety.  2/27: walking in clinic with SBA/mod. I for 100 feet with RW and use of prosthetic leg.  6/11: amb. With RW or rollator <100 feet with SBA for RW/  CGA for rollator. 7/18: 120' with SBA with RW/CGA for rollator Goal status: Ongoing  4.  Pt. Will ascend 10 stairs with use of single forearm crutch/ handrail and step to pattern with mod. I safely.  Baseline:  see above; 7/18: ascends 4 stairs with step to pattern, with B rails, and CGA Goal status: Ongoing  PLAN: PT FREQUENCY: 2x/week  PT DURATION: 8 weeks  PLANNED INTERVENTIONS: Therapeutic exercises, Therapeutic activity, Neuromuscular re-education, Balance training, Gait training, Patient/Family education, Self Care, Joint mobilization, Prosthetic training, DME instructions, and Manual therapy  PLAN FOR NEXT SESSION:  Progress gait/ standing tolerance.  F/u on upright rollator.  FOCUS ON STAIRS to prepare for beach trip in September (14 stairs to enter house).      Cammie Mcgee, PT, DPT # [phone] Physical Therapist - [place]  [facility]  7:43  AM,10/01/22

## 2022-10-04 ENCOUNTER — Ambulatory Visit: Payer: Medicare Other | Admitting: Physical Therapy

## 2022-10-05 ENCOUNTER — Encounter: Payer: Self-pay | Admitting: Physical Therapy

## 2022-10-05 ENCOUNTER — Ambulatory Visit: Payer: Medicare Other | Attending: Internal Medicine | Admitting: Physical Therapy

## 2022-10-05 DIAGNOSIS — R293 Abnormal posture: Secondary | ICD-10-CM | POA: Insufficient documentation

## 2022-10-05 DIAGNOSIS — Z89611 Acquired absence of right leg above knee: Secondary | ICD-10-CM | POA: Insufficient documentation

## 2022-10-05 DIAGNOSIS — M6281 Muscle weakness (generalized): Secondary | ICD-10-CM | POA: Insufficient documentation

## 2022-10-05 DIAGNOSIS — R269 Unspecified abnormalities of gait and mobility: Secondary | ICD-10-CM | POA: Insufficient documentation

## 2022-10-05 NOTE — Therapy (Signed)
OUTPATIENT PHYSICAL THERAPY LOWER EXTREMITY TREATMENT   Patient Name: Jasmine Morrow MRN: [id number] DOB:Mar 03, 1945, 77 y.o., female Today's Date: 10/05/2022   PT End of Session - 10/05/22 0946     Visit Number 82    Number of Visits 92    Date for PT Re-Evaluation 11/02/22    PT Start Time 0933    PT Stop Time 1032    PT Time Calculation (min) 59 min    Activity Tolerance Patient tolerated treatment well    Behavior During Therapy [facility] for tasks assessed/performed             Past Medical History:  Diagnosis Date   Anemia    Anxiety    Asthma    seasonal    GERD (gastroesophageal reflux disease)    H/O above knee amputation, 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   Hypertension    PONV (postoperative nausea and vomiting)    Seasonal allergies    Sleep apnea    does not uses CPAP machine anymore    Wheelchair dependent    able to self transfer   Past Surgical History:  Procedure Laterality Date   APPENDECTOMY     CATARACT EXTRACTION W/PHACO Right 02/28/2022   Procedure: CATARACT EXTRACTION PHACO AND INTRAOCULAR LENS PLACEMENT (IOC) RIGHT  4.82  00:30.9;  Surgeon: Nevada Crane, MD;  Location: [facility];  Service: Ophthalmology;  Laterality: Right;  sleep apnea   CATARACT EXTRACTION W/PHACO Left 03/14/2022   Procedure: CATARACT EXTRACTION PHACO AND INTRAOCULAR LENS PLACEMENT (IOC) LEFT;  Surgeon: Nevada Crane, MD;  Location: [facility];  Service: Ophthalmology;  Laterality: Left;  4.32 0:39.7   CESAREAN SECTION     CHOLECYSTECTOMY     DILATION AND CURETTAGE OF UTERUS     ESOPHAGOGASTRODUODENOSCOPY N/A 02/22/2019   Procedure: ESOPHAGOGASTRODUODENOSCOPY (EGD);  Surgeon: Toledo, Boykin Nearing, MD;  Location: [facility];  Service: Gastroenterology;  Laterality: N/A;   FLEXIBLE SIGMOIDOSCOPY N/A 02/22/2019   Procedure: FLEXIBLE SIGMOIDOSCOPY;  Surgeon: Toledo, Boykin Nearing, MD;  Location: [facility];  Service: Gastroenterology;  Laterality: N/A;   HARDWARE  REMOVAL Right 11/12/2019   Procedure: Right ankle hardware removal;  Surgeon: Kennedy Bucker, MD;  Location: [facility];  Service: Orthopedics;  Laterality: Right;   HARDWARE REMOVAL Right 04/17/2020   Procedure: HARDWARE REMOVAL FROM RIGHT ANKLE AND RIGHT KNEE; IMPLANT OF CEMENT SPACERS IN RIGHT KNEE;  Surgeon: Donato Heinz, MD;  Location: [facility];  Service: Orthopedics;  Laterality: Right;   JOINT REPLACEMENT     LEG AMPUTATION ABOVE KNEE Right 12/02/2020   [facility]   ORIF ANKLE FRACTURE Right 03/14/2019   Procedure: OPEN REDUCTION INTERNAL FIXATION (ORIF) ANKLE FRACTURE, MEDIAL MALLEOLUS;  Surgeon: Kennedy Bucker, MD;  Location: [facility];  Service: Orthopedics;  Laterality: Right;   ORIF ANKLE FRACTURE Right 02/08/2019   Procedure: OPEN REDUCTION INTERNAL FIXATION (ORIF) ANKLE FRACTURE, post malleolus;  Surgeon: Kennedy Bucker, MD;  Location: [facility];  Service: Orthopedics;  Laterality: Right;   PARTIAL HYSTERECTOMY  1993   REPLACEMENT TOTAL KNEE Bilateral 2008   2009   SCAR DEBRIDEMENT OF TOTAL KNEE  01/2020   SYNDESMOSIS REPAIR Right 02/08/2019   Procedure: SYNDESMOSIS REPAIR;  Surgeon: Kennedy Bucker, MD;  Location: [facility];  Service: Orthopedics;  Laterality: Right;   TEE WITHOUT CARDIOVERSION N/A 02/26/2019   Procedure: TRANSESOPHAGEAL ECHOCARDIOGRAM (TEE);  Surgeon: Lamar Blinks, MD;  Location: [facility];  Service: Cardiovascular;  Laterality: N/A;   TOTAL KNEE REVISION WITH SCAR DEBRIDEMENT/PATELLA REVISION WITH  POLY EXCHANGE Right 01/24/2020   Procedure: TOTAL KNEE REVISION WITH SCAR DEBRIDEMENT/PATELLA REVISION WITH POLY EXCHANGE;  Surgeon: Donato Heinz, MD;  Location: [facility];  Service: Orthopedics;  Laterality: Right;   TUMOR REMOVAL     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   Patient Active Problem List   Diagnosis Date Noted   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   Anxiety 04/17/2020   History of GI diverticular bleed 04/17/2020   Chest pain 04/17/2020    PAD (peripheral artery disease) (HCC) 04/05/2020   Ulcer of right leg (HCC) 04/05/2020   COPD (chronic obstructive pulmonary disease) (HCC) 04/05/2020   GERD (gastroesophageal reflux disease) 04/05/2020   Abscess of right knee 01/22/2020   Pressure injury of skin 02/28/2019   SOB (shortness of breath)    Gastrointestinal hemorrhage    Sepsis (HCC)    Symptomatic anemia 02/20/2019   Trimalleolar fracture of ankle, closed, right, initial encounter 02/07/2019   Multiple lung nodules 03/31/2014   Extrinsic asthma 03/27/2014   OSA on CPAP 03/27/2014    PCP: Danella Penton, MD  REFERRING PROVIDER: Danella Penton, MD  REFERRING DIAG: Above knee amputation of right lower extremity  THERAPY DIAG:  Gait difficulty  S/P AKA (above knee amputation) unilateral, right (HCC)  Muscle weakness (generalized)  Abnormal posture  Rationale for Evaluation and Treatment Rehabilitation  ONSET DATE: 12/02/20  SUBJECTIVE:  EVALUATION  PERTINENT HISTORY: Patient Profile:   Jasmine Morrow is a 77 y.o. female Chief Complaint  Patient presents with  Visit Follow Up  Doing well.   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 Cardiac cath 10/2011 showed completely normal coronary arteries, however she did have mild left ventricular dysfunction with anterior wall motion abnormality. Left ventricular EF was 51%.   History of chickenpox  Hyperlipidemia  patient states she has never been diagnosed with this  Hypertension  Knee joint replacement by other means  Macular degeneration  Major depressive disorder, recurrent, mild (CMS-HCC)  Medicare annual wellness visit, initial 08/09/2019  7/21  Multiple thyroid nodules  Normal coronary arteries 09/06/2013  Normal coronary anatomy by cardiac catheterization 10/12/11  Osteoarthritis  Ovarian cyst  PONV (postoperative nausea and vomiting)  nausea  Postoperative urinary retention 12/08/2020  Rash on lips 12/21/2020  Seasonal allergies  Sinusitis, unspecified  Sleep apnea  On CPAP.  Slow transit constipation 12/10/2020  Trimalleolar fracture of ankle, closed, right, initial encounter 02/07/2019  Complicated by staph infection 1/21, 4 weeks IV Ancef, Menz  Ulcer   Past Surgical History:  Procedure Laterality Date  SALPINGO OOPHORECTOMY Bilateral 1993  Right total knee arthroplasty 10/11/2006  Dr Ernest Pine  Left total knee arthroplasty 10/12/2007  Dr Ernest Pine  COLONOSCOPY 07/30/2012  internal hemorrhoids, diverticulosis  ANKLE ARTHODESIS W/ ARTHROSCOPY Right 02/08/2019  03/14/2019 second surgery  COLONOSCOPY 02/22/2019  Blood in entire colon/Diverticulosis - Presumed diverticular bleed. No repeat recommended per TKT.  EGD 02/22/2019  Gastritis/gastric ulcers/Hiatal hernia/Otherwise normal - no repeat recommended  per TKT.  ORIF ANKLE FRACTURE Right 03/14/2019  Dr. Rosita Kea  REMOVAL HARDWARE ANKLE FOOT/TOES Right 11/12/2019  Dr. Rosita Kea  Right knee arthrotomy, irrigation and debridement of the right knee, polyethylene exchange 01/24/2020  Dr Ernest Pine  Removal of hardware (plates and screws) from the right ankle with irrigation, debridement, and placement of Stimulan antibiotic beads 04/17/2020  Dr Ernest Pine  Right knee arthrotomy, extensive irrigation debridement, removal of right total knee implants, and placement of antibiotic impregnated  polymethylmethacrylate cement spacer 04/17/2020  Dr Ernest Pine  AMPUTATION LEG ABOVE KNEE AKA Right 12/02/2020  Procedure: AMPUTATION, THIGH, THROUGH FEMUR, ANY LEVEL; Surgeon: Starr Lake, MD; Location: [facility]; Service: Orthopedics; Laterality: Right;  TRANSFER ADJACENT TISSUE LEG Right 12/02/2020  Procedure: ADJACENT TISSUE TRANSFER OR REARRANGEMENT, LEG; DEFECT 10 SQ CM OR LESS; Surgeon: Starr Lake, MD; Location: [facility]; Service: Orthopedics; Laterality: Right;  ASPIRATION/INJECTION MAJOR JOINT/BURSA KNEE Left 12/02/2020  Procedure: ARTHROCENTESIS, ASPIRATION AND/OR INJECTION, MAJOR JOINT OR BURSA, KNEE; WITHOUT ULTRASOUND GUIDANCE; Surgeon: Starr Lake, MD; Location: [facility]; Service: Orthopedics; Laterality: Left;  REMOVAL KNEE PROSTHESIS W/POSSIBLE PLACEMENT SPACER Left 01/18/2021  Procedure: REMOVAL OF PROSTHESIS, INCLUDING TOTAL KNEE PROSTHESIS, METHYLMETHACRYLATE WITH OR WITHOUT INSERTION OF SPACER, KNEE; Surgeon: Celine Mans, MD; Location: [facility]; Service: Orthopedics; Laterality: Left;  INSERTION NON-BIODEGRADABLE DRUG DELIVERY IMPLANT Left 01/18/2021  Procedure: INSERTION, KNEE, bioresorbable, biodegradable, NON-BIODEGRADABLE DRUG DELIVERY IMPLANT; Surgeon: Celine Mans, MD; Location: [facility]; Service: Orthopedics; Laterality: Left;  ABOVE KNEE LEG AMPUTATION  12/02/2020  APPENDECTOMY Est 1968  CESAREAN SECTION  CHOLECYSTECTOMY  FRACTURE SURGERY 1/21  HYSTERECTOMY partial  JOINT REPLACEMENT 2008 & 2009  TUBAL LIGATION 3/79   ALLERGIES: Allergies  Allergen Reactions  Singulair [Montelukast] Other (See Comments)  Gland swelling  Sulfa (Sulfonamide Antibiotics) Swelling  Oxycodone Dizziness and Other (See Comments)  Vancomycin Analogues Other (See Comments)  Ringing in ears  Avinza [Morphine] Itching  Darvocet A500 [Propoxyphene N-Acetaminophen] Nausea  Nickel Rash  Ultracet  [Tramadol-Acetaminophen] Rash  Vicodin [Hydrocodone-Acetaminophen] Vomiting  Vioxx [Rofecoxib] Other (See Comments)  GI   CURRENT MEDICATIONS: Current Outpatient Medications: acetaminophen (TYLENOL) 500 MG tablet, Take 1,000 mg by mouth 3 (three) times a day Twice daily per patient., PRN Not Currently Taking aspirin 81 MG EC tablet, Take 1 tablet (81 mg total) by mouth at bedtime, Taking calcium carbonate-vitamin D3 (CALTRATE 600+D) 600 mg-10 mcg (400 unit) tablet, Take 0.5 tablets by mouth 2 (two) times daily, Taking cetirizine (ZYRTEC) 10 mg capsule, Take 1 capsule (10 mg total) by mouth once daily, Taking escitalopram oxalate (LEXAPRO) 10 MG tablet, Take 1 tablet (10 mg total) by mouth once daily, Taking estradioL (ESTRACE) 0.01 % (0.1 mg/gram) vaginal cream, Place 2 g vaginally twice a week, Taking folic acid/multivit,iron,miner (MULTIVIT-IRON-MIN-FOLIC ACID ORAL), Take 1 tablet by mouth once daily, Taking gabapentin (NEURONTIN) 300 MG capsule, Take 1 capsule (300 mg total) by mouth 3 (three) times daily Phantom limb pain, Taking melatonin 3 mg tablet, Take 1 tablet (3 mg total) by mouth at bedtime, Taking multivitamin with iron (COMPLETE MULTIVITAMIN-MINERAL) tablet, Take 1 tablet by mouth, Taking multivitamin with minerals, EYE, (PRESERVISION AREDS 2) soft gel capsule, Take 1 capsule by mouth 2 (two) times daily, Taking nystatin (MYCOSTATIN) 100,000 unit/gram powder, Apply small amount topically twice daily for rash under breasts, PRN Not Currently Taking pantoprazole (PROTONIX) 40 MG DR tablet, Take 1 tablet (40 mg total) by mouth 2 (two) times daily. Please call to schedule an appt. Thanks!, Taking polyethylene glycol (  MIRALAX) powder, Take 17 g by mouth once daily Mix in 4-8ounces of fluid prior to taking., Taking sennosides-docusate (SENOKOT-S) 8.6-50 mg tablet, Take 1 tablet by mouth 2 (two) times daily For constipation, Taking lactose-reduced food (ENSURE PLUS ORAL), Take 237 mLs by  mouth 2 (two) times daily With breakfast and dinner  HPI   CLINICAL SUMMARY:  Patient post right AKA and working with prosthesis. 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 Able to do housework, improving  PAIN:  Are you having pain? Yes: NPRS scale: 5/10 Pain location: L knee Pain description: aching Aggravating factors: walking Relieving factors: rest  PRECAUTIONS: None  WEIGHT BEARING RESTRICTIONS No  FALLS:  Has patient fallen in last 6 months? No  LIVING ENVIRONMENT: Lives with: lives with their spouse Lives in: House/apartment Stairs: No Has following equipment at home: Dan Humphreys - 4 wheeled and Wheelchair (manual)  OCCUPATION: retired  PLOF: Requires assistive device for independence  PATIENT GOALS  improve standing tolerance/ pull up pants/ ambulate with SPC safely.    OBJECTIVE:   PATIENT SURVEYS:  FOTO initial 46/ goal 44.   11/2: 50  COGNITION:  Overall cognitive status: Within functional limits for tasks assessed     SENSATION: WFL  POSTURE: rounded shoulders, forward head, flexed trunk , and weight shift left  PALPATION: No tenderness along R distal residual limb.  Discussed phantom limb sensation.    LOWER EXTREMITY ROM:  B LE AROM WFL except L knee extension secondary to articulating spacer.  Did not assess L/R hip extension at this time.    LOWER EXTREMITY MMT:  MMT Right eval Left eval  Hip flexion 4/5 4/5  Hip extension    Hip abduction 4+/5 4+/5  Hip adduction 4+/5 4+/5  Hip internal rotation    Hip external rotation    Knee flexion N/A 4+/5  Knee extension N/A 4+/5  Ankle dorsiflexion  5/5  Ankle plantarflexion    Ankle inversion    Ankle eversion     (Blank rows = not tested)  GAIT: Distance walked: in gym/ //-bars Assistive device utilized: Environmental consultant - 2 wheeled Level of assistance: CGA Comments: Cuing for posture correction with mirror feedback.  Flexed posture/ heavy UE assist.    TUG: UE assist  required to stand/ use of RW.  51.9 seconds.  8/20: TUG: 48.9 sec. (Requires UE assist on arm rests and extra time to initiate movement after standing).   TODAY'S TREATMENT:  10/05/2022  Subjective:  Pt. Reports no pain.  Pt. Reports infectious diease MD increased daily antibiotic and pt. Has been feeling more fatigue/ nausea.   Pt. Primarily using w/c at [employer].  Pt. Is going to beach in 2 weeks.     Objective:   There.ex.:  No charge   Nustep L5 10+ mins (0.5 miles) B UE/LE (consistent cadence/ no residual limb pain or tenderness reported).              Gait training:    Amb. From blue mat table to stairs with use of RW and focus on step through gait.  SBA/mod. I on R to ensure R knee mechanism locks out before wt. Bearing.  No verbal cuing required and consistent gait pattern/ step pattern.  PT adjusted/tightened fit several times.       Ascend/ descend 4 stairs 2x with heavy UE assist.  Moderate fatigue and increase pain in B shoulders/ hands on arm rests.  Pt. requires seated rest break prior to  Nustep.     Walking in PT gym to outside of building (ramp/ sidewalk) with consistent recip. Gait pattern.  Pt. only able to amb. From gym to waiting room after tx. Due to moderate overall fatigue/ discomfort in UE.  Pt. Required seated rest break in w/c prior to independently transferring in passenger side of car.      PATIENT EDUCATION:  Education details: HEP/ gait and prosthetic training.  Person educated: Patient and Spouse Education method: Explanation, Demonstration, and Verbal cues Education comprehension: verbalized understanding, returned demonstration, and tactile cues required   HOME EXERCISE PROGRAM: Prone position hip stretches/ Standing marching at kitchen counter with w/c behind patient.      ASSESSMENT:  CLINICAL IMPRESSION:    Pt. continues to ambulate with good BOS/step pattern with no episodes of R knee mechanism buckling this session. Pt. Remains motivated to  be able to ascend/descends stairs at beach in approximately 2 weeks.  No residual limb discomfort or pinching today.  Pt. Remains fatigued which limited standing/ walking tasks during tx. Session and requires multiple seated rest breaks.   Pts. Prosthetic leg continues to fit well which allows for improved gait/ standing tolerance.  Patient will continue to benefit from skilled PT services to improve standing tolerance/ gait independence and ADL.  OBJECTIVE IMPAIRMENTS Abnormal gait, decreased activity tolerance, decreased balance, decreased endurance, decreased mobility, difficulty walking, decreased ROM, decreased strength, decreased safety awareness, impaired flexibility, improper body mechanics, prosthetic dependency , and pain.   ACTIVITY LIMITATIONS carrying, lifting, standing, squatting, stairs, transfers, bed mobility, bathing, toileting, dressing, hygiene/grooming, and locomotion level  PARTICIPATION LIMITATIONS: cleaning, laundry, driving, shopping, community activity, and yard work  PERSONAL FACTORS Past/current experiences are also affecting patient's functional outcome.   REHAB POTENTIAL: Good  CLINICAL DECISION MAKING: Evolving/moderate complexity  EVALUATION COMPLEXITY: High   GOALS: Goals reviewed with patient Yes  LONG TERM GOALS: Target date: 11/02/22  Pt. Will increase FOTO to 54 to improve functional mobility.  Baseline: initial FOTO 46.  11/2: 50.  4/25: 60  Goal status: Goal met  2.  Pt. Able to tolerate standing 10 minutes while pulling up pants with mod. I safely while wearing prosthetic leg to improve daily activities.  Baseline:  limited standing tolerance Goal status: Partially met  3.  Pt. Will ambulate 200 feet with use of least assistive device and mod. I to promote safety with household tasks/ walking into bathroom.   Baseline:  amb. With RW and min. A for safety.  2/27: walking in clinic with SBA/mod. I for 100 feet with RW and use of prosthetic leg.   6/11: amb. With RW or rollator <100 feet with SBA for RW/ CGA for rollator. 7/18: 120' with SBA with RW/CGA for rollator Goal status: Ongoing  4.  Pt. Will ascend 10 stairs with use of single forearm crutch/ handrail and step to pattern with mod. I safely.  Baseline:  see above; 7/18: ascends 4 stairs with step to pattern, with B rails, and CGA Goal status: Ongoing  PLAN: PT FREQUENCY: 2x/week  PT DURATION: 8 weeks  PLANNED INTERVENTIONS: Therapeutic exercises, Therapeutic activity, Neuromuscular re-education, Balance training, Gait training, Patient/Family education, Self Care, Joint mobilization, Prosthetic training, DME instructions, and Manual therapy  PLAN FOR NEXT SESSION:  Progress gait/ standing tolerance.  F/u on upright rollator.  FOCUS ON STAIRS to prepare for beach trip in September (14 stairs to enter house).      Cammie Mcgee, PT, DPT # [phone] Physical Therapist - [place]  [facility]  1:46 PM,10/05/22

## 2022-10-06 ENCOUNTER — Ambulatory Visit: Payer: Medicare Other | Admitting: Physical Therapy

## 2022-10-07 ENCOUNTER — Ambulatory Visit: Payer: Medicare Other | Admitting: Physical Therapy

## 2022-10-07 DIAGNOSIS — R269 Unspecified abnormalities of gait and mobility: Secondary | ICD-10-CM | POA: Diagnosis not present

## 2022-10-07 DIAGNOSIS — M6281 Muscle weakness (generalized): Secondary | ICD-10-CM

## 2022-10-07 DIAGNOSIS — R293 Abnormal posture: Secondary | ICD-10-CM

## 2022-10-07 DIAGNOSIS — Z89611 Acquired absence of right leg above knee: Secondary | ICD-10-CM

## 2022-10-08 NOTE — Therapy (Signed)
OUTPATIENT PHYSICAL THERAPY LOWER EXTREMITY TREATMENT   Patient Name: Jasmine Morrow MRN: [id number] DOB:Mar 19, 1945, 77 y.o., female Today's Date: 10/08/2022   PT End of Session - 10/08/22 1713     Visit Number 83    Number of Visits 92    Date for PT Re-Evaluation 11/02/22    PT Start Time 1012    PT Stop Time 1101    PT Time Calculation (min) 49 min    Activity Tolerance Patient tolerated treatment well    Behavior During Therapy WFL for tasks assessed/performed             Past Medical History:  Diagnosis Date   Anemia    Anxiety    Asthma    seasonal    GERD (gastroesophageal reflux disease)    H/O above knee amputation, 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   Hypertension    PONV (postoperative nausea and vomiting)    Seasonal allergies    Sleep apnea    does not uses CPAP machine anymore    Wheelchair dependent    able to self transfer   Past Surgical History:  Procedure Laterality Date   APPENDECTOMY     CATARACT EXTRACTION W/PHACO Right 02/28/2022   Procedure: CATARACT EXTRACTION PHACO AND INTRAOCULAR LENS PLACEMENT (IOC) RIGHT  4.82  00:30.9;  Surgeon: Nevada Crane, MD;  Location: [facility];  Service: Ophthalmology;  Laterality: Right;  sleep apnea   CATARACT EXTRACTION W/PHACO Left 03/14/2022   Procedure: CATARACT EXTRACTION PHACO AND INTRAOCULAR LENS PLACEMENT (IOC) LEFT;  Surgeon: Nevada Crane, MD;  Location: [facility];  Service: Ophthalmology;  Laterality: Left;  4.32 0:39.7   CESAREAN SECTION     CHOLECYSTECTOMY     DILATION AND CURETTAGE OF UTERUS     ESOPHAGOGASTRODUODENOSCOPY N/A 02/22/2019   Procedure: ESOPHAGOGASTRODUODENOSCOPY (EGD);  Surgeon: Toledo, Boykin Nearing, MD;  Location: [facility];  Service: Gastroenterology;  Laterality: N/A;   FLEXIBLE SIGMOIDOSCOPY N/A 02/22/2019   Procedure: FLEXIBLE SIGMOIDOSCOPY;  Surgeon: Toledo, Boykin Nearing, MD;  Location: [facility];  Service: Gastroenterology;  Laterality: N/A;   HARDWARE  REMOVAL Right 11/12/2019   Procedure: Right ankle hardware removal;  Surgeon: Kennedy Bucker, MD;  Location: [facility];  Service: Orthopedics;  Laterality: Right;   HARDWARE REMOVAL Right 04/17/2020   Procedure: HARDWARE REMOVAL FROM RIGHT ANKLE AND RIGHT KNEE; IMPLANT OF CEMENT SPACERS IN RIGHT KNEE;  Surgeon: Donato Heinz, MD;  Location: [facility];  Service: Orthopedics;  Laterality: Right;   JOINT REPLACEMENT     LEG AMPUTATION ABOVE KNEE Right 12/02/2020   [facility]   ORIF ANKLE FRACTURE Right 03/14/2019   Procedure: OPEN REDUCTION INTERNAL FIXATION (ORIF) ANKLE FRACTURE, MEDIAL MALLEOLUS;  Surgeon: Kennedy Bucker, MD;  Location: [facility];  Service: Orthopedics;  Laterality: Right;   ORIF ANKLE FRACTURE Right 02/08/2019   Procedure: OPEN REDUCTION INTERNAL FIXATION (ORIF) ANKLE FRACTURE, post malleolus;  Surgeon: Kennedy Bucker, MD;  Location: [facility];  Service: Orthopedics;  Laterality: Right;   PARTIAL HYSTERECTOMY  1993   REPLACEMENT TOTAL KNEE Bilateral 2008   2009   SCAR DEBRIDEMENT OF TOTAL KNEE  01/2020   SYNDESMOSIS REPAIR Right 02/08/2019   Procedure: SYNDESMOSIS REPAIR;  Surgeon: Kennedy Bucker, MD;  Location: [facility];  Service: Orthopedics;  Laterality: Right;   TEE WITHOUT CARDIOVERSION N/A 02/26/2019   Procedure: TRANSESOPHAGEAL ECHOCARDIOGRAM (TEE);  Surgeon: Lamar Blinks, MD;  Location: [facility];  Service: Cardiovascular;  Laterality: N/A;   TOTAL KNEE REVISION WITH SCAR DEBRIDEMENT/PATELLA REVISION WITH  POLY EXCHANGE Right 01/24/2020   Procedure: TOTAL KNEE REVISION WITH SCAR DEBRIDEMENT/PATELLA REVISION WITH POLY EXCHANGE;  Surgeon: Donato Heinz, MD;  Location: [facility];  Service: Orthopedics;  Laterality: Right;   TUMOR REMOVAL     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   Patient Active Problem List   Diagnosis Date Noted   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   Anxiety 04/17/2020   History of GI diverticular bleed 04/17/2020   Chest pain 04/17/2020    PAD (peripheral artery disease) (HCC) 04/05/2020   Ulcer of right leg (HCC) 04/05/2020   COPD (chronic obstructive pulmonary disease) (HCC) 04/05/2020   GERD (gastroesophageal reflux disease) 04/05/2020   Abscess of right knee 01/22/2020   Pressure injury of skin 02/28/2019   SOB (shortness of breath)    Gastrointestinal hemorrhage    Sepsis (HCC)    Symptomatic anemia 02/20/2019   Trimalleolar fracture of ankle, closed, right, initial encounter 02/07/2019   Multiple lung nodules 03/31/2014   Extrinsic asthma 03/27/2014   OSA on CPAP 03/27/2014    PCP: Danella Penton, MD  REFERRING PROVIDER: Danella Penton, MD  REFERRING DIAG: Above knee amputation of right lower extremity  THERAPY DIAG:  Gait difficulty  S/P AKA (above knee amputation) unilateral, right (HCC)  Muscle weakness (generalized)  Abnormal posture  Rationale for Evaluation and Treatment Rehabilitation  ONSET DATE: 12/02/20  SUBJECTIVE:  EVALUATION  PERTINENT HISTORY: Patient Profile:   Jasmine Morrow is a 77 y.o. female Chief Complaint  Patient presents with  Visit Follow Up  Doing well.   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 Cardiac cath 10/2011 showed completely normal coronary arteries, however she did have mild left ventricular dysfunction with anterior wall motion abnormality. Left ventricular EF was 51%.   History of chickenpox  Hyperlipidemia  patient states she has never been diagnosed with this  Hypertension  Knee joint replacement by other means  Macular degeneration  Major depressive disorder, recurrent, mild (CMS-HCC)  Medicare annual wellness visit, initial 08/09/2019  7/21  Multiple thyroid nodules  Normal coronary arteries 09/06/2013  Normal coronary anatomy by cardiac catheterization 10/12/11  Osteoarthritis  Ovarian cyst  PONV (postoperative nausea and vomiting)  nausea  Postoperative urinary retention 12/08/2020  Rash on lips 12/21/2020  Seasonal allergies  Sinusitis, unspecified  Sleep apnea  On CPAP.  Slow transit constipation 12/10/2020  Trimalleolar fracture of ankle, closed, right, initial encounter 02/07/2019  Complicated by staph infection 1/21, 4 weeks IV Ancef, Menz  Ulcer   Past Surgical History:  Procedure Laterality Date  SALPINGO OOPHORECTOMY Bilateral 1993  Right total knee arthroplasty 10/11/2006  Dr Ernest Pine  Left total knee arthroplasty 10/12/2007  Dr Ernest Pine  COLONOSCOPY 07/30/2012  internal hemorrhoids, diverticulosis  ANKLE ARTHODESIS W/ ARTHROSCOPY Right 02/08/2019  03/14/2019 second surgery  COLONOSCOPY 02/22/2019  Blood in entire colon/Diverticulosis - Presumed diverticular bleed. No repeat recommended per TKT.  EGD 02/22/2019  Gastritis/gastric ulcers/Hiatal hernia/Otherwise normal - no repeat recommended  per TKT.  ORIF ANKLE FRACTURE Right 03/14/2019  Dr. Rosita Kea  REMOVAL HARDWARE ANKLE FOOT/TOES Right 11/12/2019  Dr. Rosita Kea  Right knee arthrotomy, irrigation and debridement of the right knee, polyethylene exchange 01/24/2020  Dr Ernest Pine  Removal of hardware (plates and screws) from the right ankle with irrigation, debridement, and placement of Stimulan antibiotic beads 04/17/2020  Dr Ernest Pine  Right knee arthrotomy, extensive irrigation debridement, removal of right total knee implants, and placement of antibiotic impregnated  polymethylmethacrylate cement spacer 04/17/2020  Dr Ernest Pine  AMPUTATION LEG ABOVE KNEE AKA Right 12/02/2020  Procedure: AMPUTATION, THIGH, THROUGH FEMUR, ANY LEVEL; Surgeon: Starr Lake, MD; Location: [facility]; Service: Orthopedics; Laterality: Right;  TRANSFER ADJACENT TISSUE LEG Right 12/02/2020  Procedure: ADJACENT TISSUE TRANSFER OR REARRANGEMENT, LEG; DEFECT 10 SQ CM OR LESS; Surgeon: Starr Lake, MD; Location: [facility]; Service: Orthopedics; Laterality: Right;  ASPIRATION/INJECTION MAJOR JOINT/BURSA KNEE Left 12/02/2020  Procedure: ARTHROCENTESIS, ASPIRATION AND/OR INJECTION, MAJOR JOINT OR BURSA, KNEE; WITHOUT ULTRASOUND GUIDANCE; Surgeon: Starr Lake, MD; Location: [facility]; Service: Orthopedics; Laterality: Left;  REMOVAL KNEE PROSTHESIS W/POSSIBLE PLACEMENT SPACER Left 01/18/2021  Procedure: REMOVAL OF PROSTHESIS, INCLUDING TOTAL KNEE PROSTHESIS, METHYLMETHACRYLATE WITH OR WITHOUT INSERTION OF SPACER, KNEE; Surgeon: Celine Mans, MD; Location: [facility]; Service: Orthopedics; Laterality: Left;  INSERTION NON-BIODEGRADABLE DRUG DELIVERY IMPLANT Left 01/18/2021  Procedure: INSERTION, KNEE, bioresorbable, biodegradable, NON-BIODEGRADABLE DRUG DELIVERY IMPLANT; Surgeon: Celine Mans, MD; Location: [facility]; Service: Orthopedics; Laterality: Left;  ABOVE KNEE LEG AMPUTATION  12/02/2020  APPENDECTOMY Est 1968  CESAREAN SECTION  CHOLECYSTECTOMY  FRACTURE SURGERY 1/21  HYSTERECTOMY partial  JOINT REPLACEMENT 2008 & 2009  TUBAL LIGATION 3/79   ALLERGIES: Allergies  Allergen Reactions  Singulair [Montelukast] Other (See Comments)  Gland swelling  Sulfa (Sulfonamide Antibiotics) Swelling  Oxycodone Dizziness and Other (See Comments)  Vancomycin Analogues Other (See Comments)  Ringing in ears  Avinza [Morphine] Itching  Darvocet A500 [Propoxyphene N-Acetaminophen] Nausea  Nickel Rash  Ultracet  [Tramadol-Acetaminophen] Rash  Vicodin [Hydrocodone-Acetaminophen] Vomiting  Vioxx [Rofecoxib] Other (See Comments)  GI   CURRENT MEDICATIONS: Current Outpatient Medications: acetaminophen (TYLENOL) 500 MG tablet, Take 1,000 mg by mouth 3 (three) times a day Twice daily per patient., PRN Not Currently Taking aspirin 81 MG EC tablet, Take 1 tablet (81 mg total) by mouth at bedtime, Taking calcium carbonate-vitamin D3 (CALTRATE 600+D) 600 mg-10 mcg (400 unit) tablet, Take 0.5 tablets by mouth 2 (two) times daily, Taking cetirizine (ZYRTEC) 10 mg capsule, Take 1 capsule (10 mg total) by mouth once daily, Taking escitalopram oxalate (LEXAPRO) 10 MG tablet, Take 1 tablet (10 mg total) by mouth once daily, Taking estradioL (ESTRACE) 0.01 % (0.1 mg/gram) vaginal cream, Place 2 g vaginally twice a week, Taking folic acid/multivit,iron,miner (MULTIVIT-IRON-MIN-FOLIC ACID ORAL), Take 1 tablet by mouth once daily, Taking gabapentin (NEURONTIN) 300 MG capsule, Take 1 capsule (300 mg total) by mouth 3 (three) times daily Phantom limb pain, Taking melatonin 3 mg tablet, Take 1 tablet (3 mg total) by mouth at bedtime, Taking multivitamin with iron (COMPLETE MULTIVITAMIN-MINERAL) tablet, Take 1 tablet by mouth, Taking multivitamin with minerals, EYE, (PRESERVISION AREDS 2) soft gel capsule, Take 1 capsule by mouth 2 (two) times daily, Taking nystatin (MYCOSTATIN) 100,000 unit/gram powder, Apply small amount topically twice daily for rash under breasts, PRN Not Currently Taking pantoprazole (PROTONIX) 40 MG DR tablet, Take 1 tablet (40 mg total) by mouth 2 (two) times daily. Please call to schedule an appt. Thanks!, Taking polyethylene glycol (  MIRALAX) powder, Take 17 g by mouth once daily Mix in 4-8ounces of fluid prior to taking., Taking sennosides-docusate (SENOKOT-S) 8.6-50 mg tablet, Take 1 tablet by mouth 2 (two) times daily For constipation, Taking lactose-reduced food (ENSURE PLUS ORAL), Take 237 mLs by  mouth 2 (two) times daily With breakfast and dinner  HPI   CLINICAL SUMMARY:  Patient post right AKA and working with prosthesis. 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 Able to do housework, improving  PAIN:  Are you having pain? Yes: NPRS scale: 5/10 Pain location: L knee Pain description: aching Aggravating factors: walking Relieving factors: rest  PRECAUTIONS: None  WEIGHT BEARING RESTRICTIONS No  FALLS:  Has patient fallen in last 6 months? No  LIVING ENVIRONMENT: Lives with: lives with their spouse Lives in: House/apartment Stairs: No Has following equipment at home: Dan Humphreys - 4 wheeled and Wheelchair (manual)  OCCUPATION: retired  PLOF: Requires assistive device for independence  PATIENT GOALS  improve standing tolerance/ pull up pants/ ambulate with SPC safely.    OBJECTIVE:   PATIENT SURVEYS:  FOTO initial 46/ goal 24.   11/2: 50  COGNITION:  Overall cognitive status: Within functional limits for tasks assessed     SENSATION: WFL  POSTURE: rounded shoulders, forward head, flexed trunk , and weight shift left  PALPATION: No tenderness along R distal residual limb.  Discussed phantom limb sensation.    LOWER EXTREMITY ROM:  B LE AROM WFL except L knee extension secondary to articulating spacer.  Did not assess L/R hip extension at this time.    LOWER EXTREMITY MMT:  MMT Right eval Left eval  Hip flexion 4/5 4/5  Hip extension    Hip abduction 4+/5 4+/5  Hip adduction 4+/5 4+/5  Hip internal rotation    Hip external rotation    Knee flexion N/A 4+/5  Knee extension N/A 4+/5  Ankle dorsiflexion  5/5  Ankle plantarflexion    Ankle inversion    Ankle eversion     (Blank rows = not tested)  GAIT: Distance walked: in gym/ //-bars Assistive device utilized: Environmental consultant - 2 wheeled Level of assistance: CGA Comments: Cuing for posture correction with mirror feedback.  Flexed posture/ heavy UE assist.    TUG: UE assist  required to stand/ use of RW.  51.9 seconds.  8/20: TUG: 48.9 sec. (Requires UE assist on arm rests and extra time to initiate movement after standing).   TODAY'S TREATMENT:  10/08/2022  Subjective:  Pt. Reports no pain.  Pt. States she is tired today and not feeling great due to GI issues/ antibiotic use.  Pt. Is going to beach in 2 weeks.     Objective:   There.ex.:  No charge   Nustep L5 10+ mins (0.5 miles) B UE/LE (consistent cadence/ no residual limb pain or tenderness reported).              Gait training:    Amb. From blue mat table to stairs with use of RW and focus on step through gait.  SBA/mod. I on R to ensure R knee mechanism locks out before wt. Bearing.  No verbal cuing required and consistent gait pattern/ step pattern.  PT adjusted/tightened fit several times.       Walking in PT gym/ //-bars working on consistent step pattern while maintaining upright posture.  Heavy UE assist on RW and //-bars required.  Increase wrist discomfort.  Forward/backwards walking 4 laps each.     No stairs  or outside walking today.     PATIENT EDUCATION:  Education details: HEP/ gait and prosthetic training.  Person educated: Patient and Spouse Education method: Explanation, Demonstration, and Verbal cues Education comprehension: verbalized understanding, returned demonstration, and tactile cues required   HOME EXERCISE PROGRAM: Prone position hip stretches/ Standing marching at kitchen counter with w/c behind patient.      ASSESSMENT:  CLINICAL IMPRESSION:    Pt. continues to ambulate with good BOS/step pattern with no episodes of R knee mechanism buckling this session. Pt. Remains motivated to be able to ascend/descends stairs at beach in approximately 2 weeks.  No residual limb discomfort or pinching today.  Pt. Remains fatigued which limited standing/ walking tasks during tx. Session and requires multiple seated rest breaks.   Pts. Prosthetic leg continues to fit well which allows  for improved gait/ standing tolerance.  Patient will continue to benefit from skilled PT services to improve standing tolerance/ gait independence and ADL.  OBJECTIVE IMPAIRMENTS Abnormal gait, decreased activity tolerance, decreased balance, decreased endurance, decreased mobility, difficulty walking, decreased ROM, decreased strength, decreased safety awareness, impaired flexibility, improper body mechanics, prosthetic dependency , and pain.   ACTIVITY LIMITATIONS carrying, lifting, standing, squatting, stairs, transfers, bed mobility, bathing, toileting, dressing, hygiene/grooming, and locomotion level  PARTICIPATION LIMITATIONS: cleaning, laundry, driving, shopping, community activity, and yard work  PERSONAL FACTORS Past/current experiences are also affecting patient's functional outcome.   REHAB POTENTIAL: Good  CLINICAL DECISION MAKING: Evolving/moderate complexity  EVALUATION COMPLEXITY: High   GOALS: Goals reviewed with patient Yes  LONG TERM GOALS: Target date: 11/02/22  Pt. Will increase FOTO to 54 to improve functional mobility.  Baseline: initial FOTO 46.  11/2: 50.  4/25: 60  Goal status: Goal met  2.  Pt. Able to tolerate standing 10 minutes while pulling up pants with mod. I safely while wearing prosthetic leg to improve daily activities.  Baseline:  limited standing tolerance Goal status: Partially met  3.  Pt. Will ambulate 200 feet with use of least assistive device and mod. I to promote safety with household tasks/ walking into bathroom.   Baseline:  amb. With RW and min. A for safety.  2/27: walking in clinic with SBA/mod. I for 100 feet with RW and use of prosthetic leg.  6/11: amb. With RW or rollator <100 feet with SBA for RW/ CGA for rollator. 7/18: 120' with SBA with RW/CGA for rollator Goal status: Ongoing  4.  Pt. Will ascend 10 stairs with use of single forearm crutch/ handrail and step to pattern with mod. I safely.  Baseline:  see above; 7/18: ascends  4 stairs with step to pattern, with B rails, and CGA Goal status: Ongoing  PLAN: PT FREQUENCY: 2x/week  PT DURATION: 8 weeks  PLANNED INTERVENTIONS: Therapeutic exercises, Therapeutic activity, Neuromuscular re-education, Balance training, Gait training, Patient/Family education, Self Care, Joint mobilization, Prosthetic training, DME instructions, and Manual therapy  PLAN FOR NEXT SESSION:  Progress gait/ standing tolerance.  F/u on upright rollator.  FOCUS ON STAIRS to prepare for beach trip in September (14 stairs to enter house).      Cammie Mcgee, PT, DPT # [phone] Physical Therapist - [place]  [facility]  5:14 PM,10/08/22

## 2022-10-11 ENCOUNTER — Ambulatory Visit: Payer: Medicare Other | Admitting: Physical Therapy

## 2022-10-11 DIAGNOSIS — R293 Abnormal posture: Secondary | ICD-10-CM

## 2022-10-11 DIAGNOSIS — R269 Unspecified abnormalities of gait and mobility: Secondary | ICD-10-CM

## 2022-10-11 DIAGNOSIS — M6281 Muscle weakness (generalized): Secondary | ICD-10-CM

## 2022-10-11 DIAGNOSIS — Z89611 Acquired absence of right leg above knee: Secondary | ICD-10-CM

## 2022-10-12 ENCOUNTER — Encounter: Payer: Self-pay | Admitting: Physical Therapy

## 2022-10-12 NOTE — Therapy (Signed)
OUTPATIENT PHYSICAL THERAPY LOWER EXTREMITY TREATMENT   Patient Name: Jasmine Morrow MRN: [id number] DOB:1945-11-19, 77 y.o., female Today's Date: 10/11/2022   PT End of Session - 10/12/22 0933     Visit Number 84    Number of Visits 92    Date for PT Re-Evaluation 11/02/22    PT Start Time 0934    PT Stop Time 1030    PT Time Calculation (min) 56 min    Activity Tolerance Patient tolerated treatment well    Behavior During Therapy [facility] for tasks assessed/performed             Past Medical History:  Diagnosis Date   Anemia    Anxiety    Asthma    seasonal    GERD (gastroesophageal reflux disease)    H/O above knee amputation, 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   Hypertension    PONV (postoperative nausea and vomiting)    Seasonal allergies    Sleep apnea    does not uses CPAP machine anymore    Wheelchair dependent    able to self transfer   Past Surgical History:  Procedure Laterality Date   APPENDECTOMY     CATARACT EXTRACTION W/PHACO Right 02/28/2022   Procedure: CATARACT EXTRACTION PHACO AND INTRAOCULAR LENS PLACEMENT (IOC) RIGHT  4.82  00:30.9;  Surgeon: Nevada Crane, MD;  Location: [facility];  Service: Ophthalmology;  Laterality: Right;  sleep apnea   CATARACT EXTRACTION W/PHACO Left 03/14/2022   Procedure: CATARACT EXTRACTION PHACO AND INTRAOCULAR LENS PLACEMENT (IOC) LEFT;  Surgeon: Nevada Crane, MD;  Location: [facility];  Service: Ophthalmology;  Laterality: Left;  4.32 0:39.7   CESAREAN SECTION     CHOLECYSTECTOMY     DILATION AND CURETTAGE OF UTERUS     ESOPHAGOGASTRODUODENOSCOPY N/A 02/22/2019   Procedure: ESOPHAGOGASTRODUODENOSCOPY (EGD);  Surgeon: Toledo, Boykin Nearing, MD;  Location: [facility];  Service: Gastroenterology;  Laterality: N/A;   FLEXIBLE SIGMOIDOSCOPY N/A 02/22/2019   Procedure: FLEXIBLE SIGMOIDOSCOPY;  Surgeon: Toledo, Boykin Nearing, MD;  Location: [facility];  Service: Gastroenterology;  Laterality: N/A;   HARDWARE  REMOVAL Right 11/12/2019   Procedure: Right ankle hardware removal;  Surgeon: Kennedy Bucker, MD;  Location: [facility];  Service: Orthopedics;  Laterality: Right;   HARDWARE REMOVAL Right 04/17/2020   Procedure: HARDWARE REMOVAL FROM RIGHT ANKLE AND RIGHT KNEE; IMPLANT OF CEMENT SPACERS IN RIGHT KNEE;  Surgeon: Donato Heinz, MD;  Location: [facility];  Service: Orthopedics;  Laterality: Right;   JOINT REPLACEMENT     LEG AMPUTATION ABOVE KNEE Right 12/02/2020   [facility]   ORIF ANKLE FRACTURE Right 03/14/2019   Procedure: OPEN REDUCTION INTERNAL FIXATION (ORIF) ANKLE FRACTURE, MEDIAL MALLEOLUS;  Surgeon: Kennedy Bucker, MD;  Location: [facility];  Service: Orthopedics;  Laterality: Right;   ORIF ANKLE FRACTURE Right 02/08/2019   Procedure: OPEN REDUCTION INTERNAL FIXATION (ORIF) ANKLE FRACTURE, post malleolus;  Surgeon: Kennedy Bucker, MD;  Location: [facility];  Service: Orthopedics;  Laterality: Right;   PARTIAL HYSTERECTOMY  1993   REPLACEMENT TOTAL KNEE Bilateral 2008   2009   SCAR DEBRIDEMENT OF TOTAL KNEE  01/2020   SYNDESMOSIS REPAIR Right 02/08/2019   Procedure: SYNDESMOSIS REPAIR;  Surgeon: Kennedy Bucker, MD;  Location: [facility];  Service: Orthopedics;  Laterality: Right;   TEE WITHOUT CARDIOVERSION N/A 02/26/2019   Procedure: TRANSESOPHAGEAL ECHOCARDIOGRAM (TEE);  Surgeon: Lamar Blinks, MD;  Location: [facility];  Service: Cardiovascular;  Laterality: N/A;   TOTAL KNEE REVISION WITH SCAR DEBRIDEMENT/PATELLA REVISION WITH  POLY EXCHANGE Right 01/24/2020   Procedure: TOTAL KNEE REVISION WITH SCAR DEBRIDEMENT/PATELLA REVISION WITH POLY EXCHANGE;  Surgeon: Donato Heinz, MD;  Location: [facility];  Service: Orthopedics;  Laterality: Right;   TUMOR REMOVAL     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   Patient Active Problem List   Diagnosis Date Noted   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   Anxiety 04/17/2020   History of GI diverticular bleed 04/17/2020   Chest pain 04/17/2020    PAD (peripheral artery disease) (HCC) 04/05/2020   Ulcer of right leg (HCC) 04/05/2020   COPD (chronic obstructive pulmonary disease) (HCC) 04/05/2020   GERD (gastroesophageal reflux disease) 04/05/2020   Abscess of right knee 01/22/2020   Pressure injury of skin 02/28/2019   SOB (shortness of breath)    Gastrointestinal hemorrhage    Sepsis (HCC)    Symptomatic anemia 02/20/2019   Trimalleolar fracture of ankle, closed, right, initial encounter 02/07/2019   Multiple lung nodules 03/31/2014   Extrinsic asthma 03/27/2014   OSA on CPAP 03/27/2014    PCP: Danella Penton, MD  REFERRING PROVIDER: Danella Penton, MD  REFERRING DIAG: Above knee amputation of right lower extremity  THERAPY DIAG:  Gait difficulty  S/P AKA (above knee amputation) unilateral, right (HCC)  Muscle weakness (generalized)  Abnormal posture  Rationale for Evaluation and Treatment Rehabilitation  ONSET DATE: 12/02/20  SUBJECTIVE:  EVALUATION  PERTINENT HISTORY: Patient Profile:   Jasmine Morrow is a 77 y.o. female Chief Complaint  Patient presents with  Visit Follow Up  Doing well.   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 Cardiac cath 10/2011 showed completely normal coronary arteries, however she did have mild left ventricular dysfunction with anterior wall motion abnormality. Left ventricular EF was 51%.   History of chickenpox  Hyperlipidemia  patient states she has never been diagnosed with this  Hypertension  Knee joint replacement by other means  Macular degeneration  Major depressive disorder, recurrent, mild (CMS-HCC)  Medicare annual wellness visit, initial 08/09/2019  7/21  Multiple thyroid nodules  Normal coronary arteries 09/06/2013  Normal coronary anatomy by cardiac catheterization 10/12/11  Osteoarthritis  Ovarian cyst  PONV (postoperative nausea and vomiting)  nausea  Postoperative urinary retention 12/08/2020  Rash on lips 12/21/2020  Seasonal allergies  Sinusitis, unspecified  Sleep apnea  On CPAP.  Slow transit constipation 12/10/2020  Trimalleolar fracture of ankle, closed, right, initial encounter 02/07/2019  Complicated by staph infection 1/21, 4 weeks IV Ancef, Menz  Ulcer   Past Surgical History:  Procedure Laterality Date  SALPINGO OOPHORECTOMY Bilateral 1993  Right total knee arthroplasty 10/11/2006  Dr Ernest Pine  Left total knee arthroplasty 10/12/2007  Dr Ernest Pine  COLONOSCOPY 07/30/2012  internal hemorrhoids, diverticulosis  ANKLE ARTHODESIS W/ ARTHROSCOPY Right 02/08/2019  03/14/2019 second surgery  COLONOSCOPY 02/22/2019  Blood in entire colon/Diverticulosis - Presumed diverticular bleed. No repeat recommended per TKT.  EGD 02/22/2019  Gastritis/gastric ulcers/Hiatal hernia/Otherwise normal - no repeat recommended  per TKT.  ORIF ANKLE FRACTURE Right 03/14/2019  Dr. Rosita Kea  REMOVAL HARDWARE ANKLE FOOT/TOES Right 11/12/2019  Dr. Rosita Kea  Right knee arthrotomy, irrigation and debridement of the right knee, polyethylene exchange 01/24/2020  Dr Ernest Pine  Removal of hardware (plates and screws) from the right ankle with irrigation, debridement, and placement of Stimulan antibiotic beads 04/17/2020  Dr Ernest Pine  Right knee arthrotomy, extensive irrigation debridement, removal of right total knee implants, and placement of antibiotic impregnated  polymethylmethacrylate cement spacer 04/17/2020  Dr Ernest Pine  AMPUTATION LEG ABOVE KNEE AKA Right 12/02/2020  Procedure: AMPUTATION, THIGH, THROUGH FEMUR, ANY LEVEL; Surgeon: Starr Lake, MD; Location: [facility]; Service: Orthopedics; Laterality: Right;  TRANSFER ADJACENT TISSUE LEG Right 12/02/2020  Procedure: ADJACENT TISSUE TRANSFER OR REARRANGEMENT, LEG; DEFECT 10 SQ CM OR LESS; Surgeon: Starr Lake, MD; Location: [facility]; Service: Orthopedics; Laterality: Right;  ASPIRATION/INJECTION MAJOR JOINT/BURSA KNEE Left 12/02/2020  Procedure: ARTHROCENTESIS, ASPIRATION AND/OR INJECTION, MAJOR JOINT OR BURSA, KNEE; WITHOUT ULTRASOUND GUIDANCE; Surgeon: Starr Lake, MD; Location: [facility]; Service: Orthopedics; Laterality: Left;  REMOVAL KNEE PROSTHESIS W/POSSIBLE PLACEMENT SPACER Left 01/18/2021  Procedure: REMOVAL OF PROSTHESIS, INCLUDING TOTAL KNEE PROSTHESIS, METHYLMETHACRYLATE WITH OR WITHOUT INSERTION OF SPACER, KNEE; Surgeon: Celine Mans, MD; Location: [facility]; Service: Orthopedics; Laterality: Left;  INSERTION NON-BIODEGRADABLE DRUG DELIVERY IMPLANT Left 01/18/2021  Procedure: INSERTION, KNEE, bioresorbable, biodegradable, NON-BIODEGRADABLE DRUG DELIVERY IMPLANT; Surgeon: Celine Mans, MD; Location: [facility]; Service: Orthopedics; Laterality: Left;  ABOVE KNEE LEG AMPUTATION  12/02/2020  APPENDECTOMY Est 1968  CESAREAN SECTION  CHOLECYSTECTOMY  FRACTURE SURGERY 1/21  HYSTERECTOMY partial  JOINT REPLACEMENT 2008 & 2009  TUBAL LIGATION 3/79   ALLERGIES: Allergies  Allergen Reactions  Singulair [Montelukast] Other (See Comments)  Gland swelling  Sulfa (Sulfonamide Antibiotics) Swelling  Oxycodone Dizziness and Other (See Comments)  Vancomycin Analogues Other (See Comments)  Ringing in ears  Avinza [Morphine] Itching  Darvocet A500 [Propoxyphene N-Acetaminophen] Nausea  Nickel Rash  Ultracet  [Tramadol-Acetaminophen] Rash  Vicodin [Hydrocodone-Acetaminophen] Vomiting  Vioxx [Rofecoxib] Other (See Comments)  GI   CURRENT MEDICATIONS: Current Outpatient Medications: acetaminophen (TYLENOL) 500 MG tablet, Take 1,000 mg by mouth 3 (three) times a day Twice daily per patient., PRN Not Currently Taking aspirin 81 MG EC tablet, Take 1 tablet (81 mg total) by mouth at bedtime, Taking calcium carbonate-vitamin D3 (CALTRATE 600+D) 600 mg-10 mcg (400 unit) tablet, Take 0.5 tablets by mouth 2 (two) times daily, Taking cetirizine (ZYRTEC) 10 mg capsule, Take 1 capsule (10 mg total) by mouth once daily, Taking escitalopram oxalate (LEXAPRO) 10 MG tablet, Take 1 tablet (10 mg total) by mouth once daily, Taking estradioL (ESTRACE) 0.01 % (0.1 mg/gram) vaginal cream, Place 2 g vaginally twice a week, Taking folic acid/multivit,iron,miner (MULTIVIT-IRON-MIN-FOLIC ACID ORAL), Take 1 tablet by mouth once daily, Taking gabapentin (NEURONTIN) 300 MG capsule, Take 1 capsule (300 mg total) by mouth 3 (three) times daily Phantom limb pain, Taking melatonin 3 mg tablet, Take 1 tablet (3 mg total) by mouth at bedtime, Taking multivitamin with iron (COMPLETE MULTIVITAMIN-MINERAL) tablet, Take 1 tablet by mouth, Taking multivitamin with minerals, EYE, (PRESERVISION AREDS 2) soft gel capsule, Take 1 capsule by mouth 2 (two) times daily, Taking nystatin (MYCOSTATIN) 100,000 unit/gram powder, Apply small amount topically twice daily for rash under breasts, PRN Not Currently Taking pantoprazole (PROTONIX) 40 MG DR tablet, Take 1 tablet (40 mg total) by mouth 2 (two) times daily. Please call to schedule an appt. Thanks!, Taking polyethylene glycol (  MIRALAX) powder, Take 17 g by mouth once daily Mix in 4-8ounces of fluid prior to taking., Taking sennosides-docusate (SENOKOT-S) 8.6-50 mg tablet, Take 1 tablet by mouth 2 (two) times daily For constipation, Taking lactose-reduced food (ENSURE PLUS ORAL), Take 237 mLs by  mouth 2 (two) times daily With breakfast and dinner  HPI   CLINICAL SUMMARY:  Patient post right AKA and working with prosthesis. 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 Able to do housework, improving  PAIN:  Are you having pain? Yes: NPRS scale: 5/10 Pain location: L knee Pain description: aching Aggravating factors: walking Relieving factors: rest  PRECAUTIONS: None  WEIGHT BEARING RESTRICTIONS No  FALLS:  Has patient fallen in last 6 months? No  LIVING ENVIRONMENT: Lives with: lives with their spouse Lives in: House/apartment Stairs: No Has following equipment at home: Dan Humphreys - 4 wheeled and Wheelchair (manual)  OCCUPATION: retired  PLOF: Requires assistive device for independence  PATIENT GOALS  improve standing tolerance/ pull up pants/ ambulate with SPC safely.    OBJECTIVE:   PATIENT SURVEYS:  FOTO initial 46/ goal 66.   11/2: 50  COGNITION:  Overall cognitive status: Within functional limits for tasks assessed     SENSATION: WFL  POSTURE: rounded shoulders, forward head, flexed trunk , and weight shift left  PALPATION: No tenderness along R distal residual limb.  Discussed phantom limb sensation.    LOWER EXTREMITY ROM:  B LE AROM WFL except L knee extension secondary to articulating spacer.  Did not assess L/R hip extension at this time.    LOWER EXTREMITY MMT:  MMT Right eval Left eval  Hip flexion 4/5 4/5  Hip extension    Hip abduction 4+/5 4+/5  Hip adduction 4+/5 4+/5  Hip internal rotation    Hip external rotation    Knee flexion N/A 4+/5  Knee extension N/A 4+/5  Ankle dorsiflexion  5/5  Ankle plantarflexion    Ankle inversion    Ankle eversion     (Blank rows = not tested)  GAIT: Distance walked: in gym/ //-bars Assistive device utilized: Environmental consultant - 2 wheeled Level of assistance: CGA Comments: Cuing for posture correction with mirror feedback.  Flexed posture/ heavy UE assist.    TUG: UE assist  required to stand/ use of RW.  51.9 seconds.  8/20: TUG: 48.9 sec. (Requires UE assist on arm rests and extra time to initiate movement after standing).   TODAY'S TREATMENT:  10/11/2022  Subjective:  Pt. Reports no pain.  Pt. States she is tired today and not feeling great due to GI issues/ antibiotic use.  Pt. Is going to beach in 2 weeks.     Objective:   There.ex.:  No charge   Nustep L5 10+ mins (0.5 miles) B UE/LE (consistent cadence/ no residual limb pain or tenderness reported).              Gait training:    Amb. From blue mat table to stairs with use of RW and focus on step through gait.  SBA/mod. I on R to ensure R knee mechanism locks out before wt. Bearing.  No verbal cuing required and consistent gait pattern/ step pattern.  PT adjusted/tightened fit several times.       Ascend/ descend stairs while attempting a lateral step to gait pattern and use of B UE assist on R handrail only.  Pt. Able to complete 2 steps with significant limitations/ difficulty.  Limited R hip flexion/ L knee  discomfort.  No LOB but pt. Unable to safely complete without PT mod assist.  Pt. Able to ascend/ descend 4 stairs with step to gait and B UE assist on handrails.      Walking in PT gym/ //-bars working on consistent step pattern while maintaining upright posture.  Heavy UE assist on RW and //-bars required.  Increase wrist discomfort.  Forward/backwards walking 4 laps each.      PATIENT EDUCATION:  Education details: HEP/ gait and prosthetic training.  Person educated: Patient and Spouse Education method: Explanation, Demonstration, and Verbal cues Education comprehension: verbalized understanding, returned demonstration, and tactile cues required   HOME EXERCISE PROGRAM: Prone position hip stretches/ Standing marching at kitchen counter with w/c behind patient.      ASSESSMENT:  CLINICAL IMPRESSION:    Pt. continues to ambulate with good BOS/step pattern with no episodes of R knee  mechanism buckling this session. Pt. Remains motivated to be able to ascend/descends stairs at beach in approximately 2 weeks.  Pt. Has improved overall endurance with standing/walking today as compared to last tx. Session.  Pt. Unable to safely ascend stairs with use of R handrail only.  Pts. Prosthetic leg continues to fit well which allows for improved gait/ standing tolerance.  Patient will continue to benefit from skilled PT services to improve standing tolerance/ gait independence and ADL.  OBJECTIVE IMPAIRMENTS Abnormal gait, decreased activity tolerance, decreased balance, decreased endurance, decreased mobility, difficulty walking, decreased ROM, decreased strength, decreased safety awareness, impaired flexibility, improper body mechanics, prosthetic dependency , and pain.   ACTIVITY LIMITATIONS carrying, lifting, standing, squatting, stairs, transfers, bed mobility, bathing, toileting, dressing, hygiene/grooming, and locomotion level  PARTICIPATION LIMITATIONS: cleaning, laundry, driving, shopping, community activity, and yard work  PERSONAL FACTORS Past/current experiences are also affecting patient's functional outcome.   REHAB POTENTIAL: Good  CLINICAL DECISION MAKING: Evolving/moderate complexity  EVALUATION COMPLEXITY: High   GOALS: Goals reviewed with patient Yes  LONG TERM GOALS: Target date: 11/02/22  Pt. Will increase FOTO to 54 to improve functional mobility.  Baseline: initial FOTO 46.  11/2: 50.  4/25: 60  Goal status: Goal met  2.  Pt. Able to tolerate standing 10 minutes while pulling up pants with mod. I safely while wearing prosthetic leg to improve daily activities.  Baseline:  limited standing tolerance Goal status: Partially met  3.  Pt. Will ambulate 200 feet with use of least assistive device and mod. I to promote safety with household tasks/ walking into bathroom.   Baseline:  amb. With RW and min. A for safety.  2/27: walking in clinic with SBA/mod. I  for 100 feet with RW and use of prosthetic leg.  6/11: amb. With RW or rollator <100 feet with SBA for RW/ CGA for rollator. 7/18: 120' with SBA with RW/CGA for rollator Goal status: Ongoing  4.  Pt. Will ascend 10 stairs with use of single forearm crutch/ handrail and step to pattern with mod. I safely.  Baseline:  see above; 7/18: ascends 4 stairs with step to pattern, with B rails, and CGA Goal status: Ongoing  PLAN: PT FREQUENCY: 2x/week  PT DURATION: 8 weeks  PLANNED INTERVENTIONS: Therapeutic exercises, Therapeutic activity, Neuromuscular re-education, Balance training, Gait training, Patient/Family education, Self Care, Joint mobilization, Prosthetic training, DME instructions, and Manual therapy  PLAN FOR NEXT SESSION:  Progress gait/ standing tolerance.  F/u on upright rollator.  FOCUS ON STAIRS to prepare for beach trip in September (14 stairs to enter house).  Cammie Mcgee, PT, DPT # [phone] Physical Therapist - [place]  [facility] [place]  9:34 AM,10/12/22

## 2022-10-14 ENCOUNTER — Ambulatory Visit: Payer: Medicare Other | Admitting: Physical Therapy

## 2022-10-18 ENCOUNTER — Ambulatory Visit: Payer: Medicare Other | Admitting: Physical Therapy

## 2022-10-20 ENCOUNTER — Ambulatory Visit: Payer: Medicare Other | Admitting: Physical Therapy

## 2022-10-20 DIAGNOSIS — M6281 Muscle weakness (generalized): Secondary | ICD-10-CM

## 2022-10-20 DIAGNOSIS — Z89611 Acquired absence of right leg above knee: Secondary | ICD-10-CM

## 2022-10-20 DIAGNOSIS — R293 Abnormal posture: Secondary | ICD-10-CM

## 2022-10-20 DIAGNOSIS — R269 Unspecified abnormalities of gait and mobility: Secondary | ICD-10-CM

## 2022-10-22 NOTE — Therapy (Signed)
OUTPATIENT PHYSICAL THERAPY LOWER EXTREMITY TREATMENT   Patient Name: Jasmine Morrow MRN: [id number] DOB:28-Nov-1945, 77 y.o., female Today's Date: 10/20/2022   PT End of Session - 10/22/22 1536     Visit Number 85    Number of Visits 92    Date for PT Re-Evaluation 11/02/22    PT Start Time 1023    PT Stop Time 1130    PT Time Calculation (min) 67 min    Activity Tolerance Patient tolerated treatment well    Behavior During Therapy WFL for tasks assessed/performed             Past Medical History:  Diagnosis Date   Anemia    Anxiety    Asthma    seasonal    GERD (gastroesophageal reflux disease)    H/O above knee amputation, 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   Hypertension    PONV (postoperative nausea and vomiting)    Seasonal allergies    Sleep apnea    does not uses CPAP machine anymore    Wheelchair dependent    able to self transfer   Past Surgical History:  Procedure Laterality Date   APPENDECTOMY     CATARACT EXTRACTION W/PHACO Right 02/28/2022   Procedure: CATARACT EXTRACTION PHACO AND INTRAOCULAR LENS PLACEMENT (IOC) RIGHT  4.82  00:30.9;  Surgeon: Nevada Crane, MD;  Location: [facility];  Service: Ophthalmology;  Laterality: Right;  sleep apnea   CATARACT EXTRACTION W/PHACO Left 03/14/2022   Procedure: CATARACT EXTRACTION PHACO AND INTRAOCULAR LENS PLACEMENT (IOC) LEFT;  Surgeon: Nevada Crane, MD;  Location: [facility];  Service: Ophthalmology;  Laterality: Left;  4.32 0:39.7   CESAREAN SECTION     CHOLECYSTECTOMY     DILATION AND CURETTAGE OF UTERUS     ESOPHAGOGASTRODUODENOSCOPY N/A 02/22/2019   Procedure: ESOPHAGOGASTRODUODENOSCOPY (EGD);  Surgeon: Toledo, Boykin Nearing, MD;  Location: [facility];  Service: Gastroenterology;  Laterality: N/A;   FLEXIBLE SIGMOIDOSCOPY N/A 02/22/2019   Procedure: FLEXIBLE SIGMOIDOSCOPY;  Surgeon: Toledo, Boykin Nearing, MD;  Location: [facility];  Service: Gastroenterology;  Laterality: N/A;   HARDWARE  REMOVAL Right 11/12/2019   Procedure: Right ankle hardware removal;  Surgeon: Kennedy Bucker, MD;  Location: [facility];  Service: Orthopedics;  Laterality: Right;   HARDWARE REMOVAL Right 04/17/2020   Procedure: HARDWARE REMOVAL FROM RIGHT ANKLE AND RIGHT KNEE; IMPLANT OF CEMENT SPACERS IN RIGHT KNEE;  Surgeon: Donato Heinz, MD;  Location: [facility];  Service: Orthopedics;  Laterality: Right;   JOINT REPLACEMENT     LEG AMPUTATION ABOVE KNEE Right 12/02/2020   [facility]   ORIF ANKLE FRACTURE Right 03/14/2019   Procedure: OPEN REDUCTION INTERNAL FIXATION (ORIF) ANKLE FRACTURE, MEDIAL MALLEOLUS;  Surgeon: Kennedy Bucker, MD;  Location: [facility];  Service: Orthopedics;  Laterality: Right;   ORIF ANKLE FRACTURE Right 02/08/2019   Procedure: OPEN REDUCTION INTERNAL FIXATION (ORIF) ANKLE FRACTURE, post malleolus;  Surgeon: Kennedy Bucker, MD;  Location: [facility];  Service: Orthopedics;  Laterality: Right;   PARTIAL HYSTERECTOMY  1993   REPLACEMENT TOTAL KNEE Bilateral 2008   2009   SCAR DEBRIDEMENT OF TOTAL KNEE  01/2020   SYNDESMOSIS REPAIR Right 02/08/2019   Procedure: SYNDESMOSIS REPAIR;  Surgeon: Kennedy Bucker, MD;  Location: [facility];  Service: Orthopedics;  Laterality: Right;   TEE WITHOUT CARDIOVERSION N/A 02/26/2019   Procedure: TRANSESOPHAGEAL ECHOCARDIOGRAM (TEE);  Surgeon: Lamar Blinks, MD;  Location: [facility];  Service: Cardiovascular;  Laterality: N/A;   TOTAL KNEE REVISION WITH SCAR DEBRIDEMENT/PATELLA REVISION WITH  POLY EXCHANGE Right 01/24/2020   Procedure: TOTAL KNEE REVISION WITH SCAR DEBRIDEMENT/PATELLA REVISION WITH POLY EXCHANGE;  Surgeon: Donato Heinz, MD;  Location: [facility];  Service: Orthopedics;  Laterality: Right;   TUMOR REMOVAL     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   Patient Active Problem List   Diagnosis Date Noted   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   Anxiety 04/17/2020   History of GI diverticular bleed 04/17/2020   Chest pain 04/17/2020    PAD (peripheral artery disease) (HCC) 04/05/2020   Ulcer of right leg (HCC) 04/05/2020   COPD (chronic obstructive pulmonary disease) (HCC) 04/05/2020   GERD (gastroesophageal reflux disease) 04/05/2020   Abscess of right knee 01/22/2020   Pressure injury of skin 02/28/2019   SOB (shortness of breath)    Gastrointestinal hemorrhage    Sepsis (HCC)    Symptomatic anemia 02/20/2019   Trimalleolar fracture of ankle, closed, right, initial encounter 02/07/2019   Multiple lung nodules 03/31/2014   Extrinsic asthma 03/27/2014   OSA on CPAP 03/27/2014    PCP: Danella Penton, MD  REFERRING PROVIDER: Danella Penton, MD  REFERRING DIAG: Above knee amputation of right lower extremity  THERAPY DIAG:  Gait difficulty  S/P AKA (above knee amputation) unilateral, right (HCC)  Muscle weakness (generalized)  Abnormal posture  Rationale for Evaluation and Treatment Rehabilitation  ONSET DATE: 12/02/20  SUBJECTIVE:  EVALUATION  PERTINENT HISTORY: Patient Profile:   Jasmine Morrow is a 77 y.o. female Chief Complaint  Patient presents with  Visit Follow Up  Doing well.   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 Cardiac cath 10/2011 showed completely normal coronary arteries, however she did have mild left ventricular dysfunction with anterior wall motion abnormality. Left ventricular EF was 51%.   History of chickenpox  Hyperlipidemia  patient states she has never been diagnosed with this  Hypertension  Knee joint replacement by other means  Macular degeneration  Major depressive disorder, recurrent, mild (CMS-HCC)  Medicare annual wellness visit, initial 08/09/2019  7/21  Multiple thyroid nodules  Normal coronary arteries 09/06/2013  Normal coronary anatomy by cardiac catheterization 10/12/11  Osteoarthritis  Ovarian cyst  PONV (postoperative nausea and vomiting)  nausea  Postoperative urinary retention 12/08/2020  Rash on lips 12/21/2020  Seasonal allergies  Sinusitis, unspecified  Sleep apnea  On CPAP.  Slow transit constipation 12/10/2020  Trimalleolar fracture of ankle, closed, right, initial encounter 02/07/2019  Complicated by staph infection 1/21, 4 weeks IV Ancef, Menz  Ulcer   Past Surgical History:  Procedure Laterality Date  SALPINGO OOPHORECTOMY Bilateral 1993  Right total knee arthroplasty 10/11/2006  Dr Ernest Pine  Left total knee arthroplasty 10/12/2007  Dr Ernest Pine  COLONOSCOPY 07/30/2012  internal hemorrhoids, diverticulosis  ANKLE ARTHODESIS W/ ARTHROSCOPY Right 02/08/2019  03/14/2019 second surgery  COLONOSCOPY 02/22/2019  Blood in entire colon/Diverticulosis - Presumed diverticular bleed. No repeat recommended per TKT.  EGD 02/22/2019  Gastritis/gastric ulcers/Hiatal hernia/Otherwise normal - no repeat recommended  per TKT.  ORIF ANKLE FRACTURE Right 03/14/2019  Dr. Rosita Kea  REMOVAL HARDWARE ANKLE FOOT/TOES Right 11/12/2019  Dr. Rosita Kea  Right knee arthrotomy, irrigation and debridement of the right knee, polyethylene exchange 01/24/2020  Dr Ernest Pine  Removal of hardware (plates and screws) from the right ankle with irrigation, debridement, and placement of Stimulan antibiotic beads 04/17/2020  Dr Ernest Pine  Right knee arthrotomy, extensive irrigation debridement, removal of right total knee implants, and placement of antibiotic impregnated  polymethylmethacrylate cement spacer 04/17/2020  Dr Ernest Pine  AMPUTATION LEG ABOVE KNEE AKA Right 12/02/2020  Procedure: AMPUTATION, THIGH, THROUGH FEMUR, ANY LEVEL; Surgeon: Starr Lake, MD; Location: [facility]; Service: Orthopedics; Laterality: Right;  TRANSFER ADJACENT TISSUE LEG Right 12/02/2020  Procedure: ADJACENT TISSUE TRANSFER OR REARRANGEMENT, LEG; DEFECT 10 SQ CM OR LESS; Surgeon: Starr Lake, MD; Location: [facility]; Service: Orthopedics; Laterality: Right;  ASPIRATION/INJECTION MAJOR JOINT/BURSA KNEE Left 12/02/2020  Procedure: ARTHROCENTESIS, ASPIRATION AND/OR INJECTION, MAJOR JOINT OR BURSA, KNEE; WITHOUT ULTRASOUND GUIDANCE; Surgeon: Starr Lake, MD; Location: [facility]; Service: Orthopedics; Laterality: Left;  REMOVAL KNEE PROSTHESIS W/POSSIBLE PLACEMENT SPACER Left 01/18/2021  Procedure: REMOVAL OF PROSTHESIS, INCLUDING TOTAL KNEE PROSTHESIS, METHYLMETHACRYLATE WITH OR WITHOUT INSERTION OF SPACER, KNEE; Surgeon: Celine Mans, MD; Location: [facility]; Service: Orthopedics; Laterality: Left;  INSERTION NON-BIODEGRADABLE DRUG DELIVERY IMPLANT Left 01/18/2021  Procedure: INSERTION, KNEE, bioresorbable, biodegradable, NON-BIODEGRADABLE DRUG DELIVERY IMPLANT; Surgeon: Celine Mans, MD; Location: [facility]; Service: Orthopedics; Laterality: Left;  ABOVE KNEE LEG AMPUTATION  12/02/2020  APPENDECTOMY Est 1968  CESAREAN SECTION  CHOLECYSTECTOMY  FRACTURE SURGERY 1/21  HYSTERECTOMY partial  JOINT REPLACEMENT 2008 & 2009  TUBAL LIGATION 3/79   ALLERGIES: Allergies  Allergen Reactions  Singulair [Montelukast] Other (See Comments)  Gland swelling  Sulfa (Sulfonamide Antibiotics) Swelling  Oxycodone Dizziness and Other (See Comments)  Vancomycin Analogues Other (See Comments)  Ringing in ears  Avinza [Morphine] Itching  Darvocet A500 [Propoxyphene N-Acetaminophen] Nausea  Nickel Rash  Ultracet  [Tramadol-Acetaminophen] Rash  Vicodin [Hydrocodone-Acetaminophen] Vomiting  Vioxx [Rofecoxib] Other (See Comments)  GI   CURRENT MEDICATIONS: Current Outpatient Medications: acetaminophen (TYLENOL) 500 MG tablet, Take 1,000 mg by mouth 3 (three) times a day Twice daily per patient., PRN Not Currently Taking aspirin 81 MG EC tablet, Take 1 tablet (81 mg total) by mouth at bedtime, Taking calcium carbonate-vitamin D3 (CALTRATE 600+D) 600 mg-10 mcg (400 unit) tablet, Take 0.5 tablets by mouth 2 (two) times daily, Taking cetirizine (ZYRTEC) 10 mg capsule, Take 1 capsule (10 mg total) by mouth once daily, Taking escitalopram oxalate (LEXAPRO) 10 MG tablet, Take 1 tablet (10 mg total) by mouth once daily, Taking estradioL (ESTRACE) 0.01 % (0.1 mg/gram) vaginal cream, Place 2 g vaginally twice a week, Taking folic acid/multivit,iron,miner (MULTIVIT-IRON-MIN-FOLIC ACID ORAL), Take 1 tablet by mouth once daily, Taking gabapentin (NEURONTIN) 300 MG capsule, Take 1 capsule (300 mg total) by mouth 3 (three) times daily Phantom limb pain, Taking melatonin 3 mg tablet, Take 1 tablet (3 mg total) by mouth at bedtime, Taking multivitamin with iron (COMPLETE MULTIVITAMIN-MINERAL) tablet, Take 1 tablet by mouth, Taking multivitamin with minerals, EYE, (PRESERVISION AREDS 2) soft gel capsule, Take 1 capsule by mouth 2 (two) times daily, Taking nystatin (MYCOSTATIN) 100,000 unit/gram powder, Apply small amount topically twice daily for rash under breasts, PRN Not Currently Taking pantoprazole (PROTONIX) 40 MG DR tablet, Take 1 tablet (40 mg total) by mouth 2 (two) times daily. Please call to schedule an appt. Thanks!, Taking polyethylene glycol (  MIRALAX) powder, Take 17 g by mouth once daily Mix in 4-8ounces of fluid prior to taking., Taking sennosides-docusate (SENOKOT-S) 8.6-50 mg tablet, Take 1 tablet by mouth 2 (two) times daily For constipation, Taking lactose-reduced food (ENSURE PLUS ORAL), Take 237 mLs by  mouth 2 (two) times daily With breakfast and dinner  HPI   CLINICAL SUMMARY:  Patient post right AKA and working with prosthesis. 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 Able to do housework, improving  PAIN:  Are you having pain? Yes: NPRS scale: 5/10 Pain location: L knee Pain description: aching Aggravating factors: walking Relieving factors: rest  PRECAUTIONS: None  WEIGHT BEARING RESTRICTIONS No  FALLS:  Has patient fallen in last 6 months? No  LIVING ENVIRONMENT: Lives with: lives with their spouse Lives in: House/apartment Stairs: No Has following equipment at home: Dan Humphreys - 4 wheeled and Wheelchair (manual)  OCCUPATION: retired  PLOF: Requires assistive device for independence  PATIENT GOALS  improve standing tolerance/ pull up pants/ ambulate with SPC safely.    OBJECTIVE:   PATIENT SURVEYS:  FOTO initial 46/ goal 75.   11/2: 50  COGNITION:  Overall cognitive status: Within functional limits for tasks assessed     SENSATION: WFL  POSTURE: rounded shoulders, forward head, flexed trunk , and weight shift left  PALPATION: No tenderness along R distal residual limb.  Discussed phantom limb sensation.    LOWER EXTREMITY ROM:  B LE AROM WFL except L knee extension secondary to articulating spacer.  Did not assess L/R hip extension at this time.    LOWER EXTREMITY MMT:  MMT Right eval Left eval  Hip flexion 4/5 4/5  Hip extension    Hip abduction 4+/5 4+/5  Hip adduction 4+/5 4+/5  Hip internal rotation    Hip external rotation    Knee flexion N/A 4+/5  Knee extension N/A 4+/5  Ankle dorsiflexion  5/5  Ankle plantarflexion    Ankle inversion    Ankle eversion     (Blank rows = not tested)  GAIT: Distance walked: in gym/ //-bars Assistive device utilized: Environmental consultant - 2 wheeled Level of assistance: CGA Comments: Cuing for posture correction with mirror feedback.  Flexed posture/ heavy UE assist.    TUG: UE assist  required to stand/ use of RW.  51.9 seconds.  8/20: TUG: 48.9 sec. (Requires UE assist on arm rests and extra time to initiate movement after standing).   TODAY'S TREATMENT:  10/20/2022  Subjective:  Pt. Missed last 2 PT tx. Sessions secondary to GI issues and rainy weather respectfully.  Pt. Leaves for beach on Sunday and PT discussed ascending/ descending stairs and possible use of fireman to safely get in house.  Pt./PT discussed trying single forearm crutch with use of handrail to complete stairs.   Objective:   There.ex.:  No charge   Nustep L5 10+ mins (0.5 miles) B UE/LE (consistent cadence/ no residual limb pain or tenderness reported).              Gait training:    Amb. From blue mat table to Nustep with use of RW and focus on step through gait.  SBA/mod. I on R to ensure R knee mechanism locks out before wt. Bearing.  No verbal cuing required and consistent gait pattern/ step pattern.        Ascending/descending stairs with L forearm crutch/ R handrails and step to gait pattern. Pt. Able to complete 4 steps x2 with CGA for safety.  Good knee mechanism control/ heel strike.  PT allowed pt. To borrow forearm crutch to use of home with assist of family.     Walking in PT gym working on consistent step pattern while maintaining upright posture.  Heavy UE assist on RW from stairs with front of clinic.  Pt. Very fatigued and requires use of w/c to get to car.        PATIENT EDUCATION:  Education details: HEP/ gait and prosthetic training.  Person educated: Patient and Spouse Education method: Explanation, Demonstration, and Verbal cues Education comprehension: verbalized understanding, returned demonstration, and tactile cues required   HOME EXERCISE PROGRAM: Prone position hip stretches/ Standing marching at kitchen counter with w/c behind patient.      ASSESSMENT:  CLINICAL IMPRESSION:    Pt. continues to ambulate with good BOS/step pattern with no episodes of R knee  mechanism buckling this session. Pts. Prosthetic leg continues to fit well which allows for improved gait/ standing tolerance.  Pt. Demonstrates ability to ascend/descend stairs with step to pattern and use of forearm crutch.  Patient will continue to benefit from skilled PT services to improve standing tolerance/ gait independence and ADL.  OBJECTIVE IMPAIRMENTS Abnormal gait, decreased activity tolerance, decreased balance, decreased endurance, decreased mobility, difficulty walking, decreased ROM, decreased strength, decreased safety awareness, impaired flexibility, improper body mechanics, prosthetic dependency , and pain.   ACTIVITY LIMITATIONS carrying, lifting, standing, squatting, stairs, transfers, bed mobility, bathing, toileting, dressing, hygiene/grooming, and locomotion level  PARTICIPATION LIMITATIONS: cleaning, laundry, driving, shopping, community activity, and yard work  PERSONAL FACTORS Past/current experiences are also affecting patient's functional outcome.   REHAB POTENTIAL: Good  CLINICAL DECISION MAKING: Evolving/moderate complexity  EVALUATION COMPLEXITY: High   GOALS: Goals reviewed with patient Yes  LONG TERM GOALS: Target date: 11/02/22  Pt. Will increase FOTO to 54 to improve functional mobility.  Baseline: initial FOTO 46.  11/2: 50.  4/25: 60  Goal status: Goal met  2.  Pt. Able to tolerate standing 10 minutes while pulling up pants with mod. I safely while wearing prosthetic leg to improve daily activities.  Baseline:  limited standing tolerance Goal status: Partially met  3.  Pt. Will ambulate 200 feet with use of least assistive device and mod. I to promote safety with household tasks/ walking into bathroom.   Baseline:  amb. With RW and min. A for safety.  2/27: walking in clinic with SBA/mod. I for 100 feet with RW and use of prosthetic leg.  6/11: amb. With RW or rollator <100 feet with SBA for RW/ CGA for rollator. 7/18: 120' with SBA with RW/CGA for  rollator Goal status: Ongoing  4.  Pt. Will ascend 10 stairs with use of single forearm crutch/ handrail and step to pattern with mod. I safely.  Baseline:  see above; 7/18: ascends 4 stairs with step to pattern, with B rails, and CGA Goal status: Ongoing  PLAN: PT FREQUENCY: 2x/week  PT DURATION: 8 weeks  PLANNED INTERVENTIONS: Therapeutic exercises, Therapeutic activity, Neuromuscular re-education, Balance training, Gait training, Patient/Family education, Self Care, Joint mobilization, Prosthetic training, DME instructions, and Manual therapy  PLAN FOR NEXT SESSION:  Discuss beach trip   Cammie Mcgee, PT, DPT # [phone] Physical Therapist - [facility]  [facility]  3:43 PM,10/22/22

## 2022-10-25 ENCOUNTER — Encounter: Payer: Medicare Other | Admitting: Physical Therapy

## 2022-10-27 ENCOUNTER — Encounter: Payer: Medicare Other | Admitting: Physical Therapy

## 2022-11-04 ENCOUNTER — Encounter: Payer: Medicare Other | Admitting: Physical Therapy

## 2022-11-16 ENCOUNTER — Encounter: Payer: Self-pay | Admitting: Physical Therapy

## 2022-11-16 ENCOUNTER — Ambulatory Visit: Payer: Medicare Other | Attending: Internal Medicine | Admitting: Physical Therapy

## 2022-11-16 DIAGNOSIS — M6281 Muscle weakness (generalized): Secondary | ICD-10-CM | POA: Insufficient documentation

## 2022-11-16 DIAGNOSIS — R293 Abnormal posture: Secondary | ICD-10-CM | POA: Diagnosis present

## 2022-11-16 DIAGNOSIS — Z89611 Acquired absence of right leg above knee: Secondary | ICD-10-CM | POA: Diagnosis present

## 2022-11-16 DIAGNOSIS — R269 Unspecified abnormalities of gait and mobility: Secondary | ICD-10-CM | POA: Insufficient documentation

## 2022-11-16 NOTE — Therapy (Signed)
OUTPATIENT PHYSICAL THERAPY LOWER EXTREMITY TREATMENT/ RECERTIFICATION   Patient Name: Jasmine Morrow MRN: [id number] DOB:April 26, 1945, 77 y.o., female Today's Date: 11/16/2022   PT End of Session - 11/18/22 0900     Visit Number 86    Number of Visits 92    Date for PT Re-Evaluation 12/28/22    PT Start Time 1018    PT Stop Time 1117    PT Time Calculation (min) 59 min    Activity Tolerance Patient tolerated treatment well    Behavior During Therapy WFL for tasks assessed/performed             Past Medical History:  Diagnosis Date   Anemia    Anxiety    Asthma    seasonal    GERD (gastroesophageal reflux disease)    H/O above knee amputation, 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   Hypertension    PONV (postoperative nausea and vomiting)    Seasonal allergies    Sleep apnea    does not uses CPAP machine anymore    Wheelchair dependent    able to self transfer   Past Surgical History:  Procedure Laterality Date   APPENDECTOMY     CATARACT EXTRACTION W/PHACO Right 02/28/2022   Procedure: CATARACT EXTRACTION PHACO AND INTRAOCULAR LENS PLACEMENT (IOC) RIGHT  4.82  00:30.9;  Surgeon: Nevada Crane, MD;  Location: [facility];  Service: Ophthalmology;  Laterality: Right;  sleep apnea   CATARACT EXTRACTION W/PHACO Left 03/14/2022   Procedure: CATARACT EXTRACTION PHACO AND INTRAOCULAR LENS PLACEMENT (IOC) LEFT;  Surgeon: Nevada Crane, MD;  Location: [facility];  Service: Ophthalmology;  Laterality: Left;  4.32 0:39.7   CESAREAN SECTION     CHOLECYSTECTOMY     DILATION AND CURETTAGE OF UTERUS     ESOPHAGOGASTRODUODENOSCOPY N/A 02/22/2019   Procedure: ESOPHAGOGASTRODUODENOSCOPY (EGD);  Surgeon: Toledo, Boykin Nearing, MD;  Location: [facility];  Service: Gastroenterology;  Laterality: N/A;   FLEXIBLE SIGMOIDOSCOPY N/A 02/22/2019   Procedure: FLEXIBLE SIGMOIDOSCOPY;  Surgeon: Toledo, Boykin Nearing, MD;  Location: [facility];  Service: Gastroenterology;   Laterality: N/A;   HARDWARE REMOVAL Right 11/12/2019   Procedure: Right ankle hardware removal;  Surgeon: Kennedy Bucker, MD;  Location: [facility];  Service: Orthopedics;  Laterality: Right;   HARDWARE REMOVAL Right 04/17/2020   Procedure: HARDWARE REMOVAL FROM RIGHT ANKLE AND RIGHT KNEE; IMPLANT OF CEMENT SPACERS IN RIGHT KNEE;  Surgeon: Donato Heinz, MD;  Location: [facility];  Service: Orthopedics;  Laterality: Right;   JOINT REPLACEMENT     LEG AMPUTATION ABOVE KNEE Right 12/02/2020   [facility]   ORIF ANKLE FRACTURE Right 03/14/2019   Procedure: OPEN REDUCTION INTERNAL FIXATION (ORIF) ANKLE FRACTURE, MEDIAL MALLEOLUS;  Surgeon: Kennedy Bucker, MD;  Location: [facility];  Service: Orthopedics;  Laterality: Right;   ORIF ANKLE FRACTURE Right 02/08/2019   Procedure: OPEN REDUCTION INTERNAL FIXATION (ORIF) ANKLE FRACTURE, post malleolus;  Surgeon: Kennedy Bucker, MD;  Location: [facility];  Service: Orthopedics;  Laterality: Right;   PARTIAL HYSTERECTOMY  1993   REPLACEMENT TOTAL KNEE Bilateral 2008   2009   SCAR DEBRIDEMENT OF TOTAL KNEE  01/2020   SYNDESMOSIS REPAIR Right 02/08/2019   Procedure: SYNDESMOSIS REPAIR;  Surgeon: Kennedy Bucker, MD;  Location: [facility];  Service: Orthopedics;  Laterality: Right;   TEE WITHOUT CARDIOVERSION N/A 02/26/2019   Procedure: TRANSESOPHAGEAL ECHOCARDIOGRAM (TEE);  Surgeon: Lamar Blinks, MD;  Location: [facility];  Service: Cardiovascular;  Laterality: N/A;   TOTAL KNEE REVISION WITH SCAR DEBRIDEMENT/PATELLA REVISION  WITH POLY EXCHANGE Right 01/24/2020   Procedure: TOTAL KNEE REVISION WITH SCAR DEBRIDEMENT/PATELLA REVISION WITH POLY EXCHANGE;  Surgeon: Donato Heinz, MD;  Location: [facility];  Service: Orthopedics;  Laterality: Right;   TUMOR REMOVAL     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   Patient Active Problem List   Diagnosis Date Noted   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   Anxiety 04/17/2020   History of GI diverticular bleed  04/17/2020   Chest pain 04/17/2020   PAD (peripheral artery disease) (HCC) 04/05/2020   Ulcer of right leg (HCC) 04/05/2020   COPD (chronic obstructive pulmonary disease) (HCC) 04/05/2020   GERD (gastroesophageal reflux disease) 04/05/2020   Abscess of right knee 01/22/2020   Pressure injury of skin 02/28/2019   SOB (shortness of breath)    Gastrointestinal hemorrhage    Sepsis (HCC)    Symptomatic anemia 02/20/2019   Trimalleolar fracture of ankle, closed, right, initial encounter 02/07/2019   Multiple lung nodules 03/31/2014   Extrinsic asthma 03/27/2014   OSA on CPAP 03/27/2014    PCP: Danella Penton, MD  REFERRING PROVIDER: Danella Penton, MD  REFERRING DIAG: Above knee amputation of right lower extremity  THERAPY DIAG:  Gait difficulty  S/P AKA (above knee amputation) unilateral, right (HCC)  Muscle weakness (generalized)  Abnormal posture  Rationale for Evaluation and Treatment Rehabilitation  ONSET DATE: 12/02/20  SUBJECTIVE:  EVALUATION  PERTINENT HISTORY: Patient Profile:   Jasmine Morrow is a 77 y.o. female Chief Complaint  Patient presents with  Visit Follow Up  Doing well.   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 Cardiac cath 10/2011 showed completely normal coronary arteries, however she did have mild left ventricular dysfunction with anterior wall motion  abnormality. Left ventricular EF was 51%.  History of chickenpox  Hyperlipidemia  patient states she has never been diagnosed with this  Hypertension  Knee joint replacement by other means  Macular degeneration  Major depressive disorder, recurrent, mild (CMS-HCC)  Medicare annual wellness visit, initial 08/09/2019  7/21  Multiple thyroid nodules  Normal coronary arteries 09/06/2013  Normal coronary anatomy by cardiac catheterization 10/12/11  Osteoarthritis  Ovarian cyst  PONV (postoperative nausea and vomiting)  nausea  Postoperative urinary retention 12/08/2020  Rash on lips 12/21/2020  Seasonal allergies  Sinusitis, unspecified  Sleep apnea  On CPAP.  Slow transit constipation 12/10/2020  Trimalleolar fracture of ankle, closed, right, initial encounter 02/07/2019  Complicated by staph infection 1/21, 4 weeks IV Ancef, Menz  Ulcer   Past Surgical History:  Procedure Laterality Date  SALPINGO OOPHORECTOMY Bilateral 1993  Right total knee arthroplasty 10/11/2006  Dr Ernest Pine  Left total knee arthroplasty 10/12/2007  Dr Ernest Pine  COLONOSCOPY 07/30/2012  internal hemorrhoids, diverticulosis  ANKLE ARTHODESIS W/ ARTHROSCOPY Right 02/08/2019  03/14/2019 second surgery  COLONOSCOPY 02/22/2019  Blood in entire colon/Diverticulosis - Presumed diverticular bleed. No repeat recommended per TKT.  EGD 02/22/2019  Gastritis/gastric ulcers/Hiatal hernia/Otherwise normal - no repeat  recommended per TKT.  ORIF ANKLE FRACTURE Right 03/14/2019  Dr. Rosita Kea  REMOVAL HARDWARE ANKLE FOOT/TOES Right 11/12/2019  Dr. Rosita Kea  Right knee arthrotomy, irrigation and debridement of the right knee, polyethylene exchange 01/24/2020  Dr Ernest Pine  Removal of hardware (plates and screws) from the right ankle with irrigation, debridement, and placement of Stimulan antibiotic beads 04/17/2020  Dr Ernest Pine  Right knee arthrotomy, extensive irrigation debridement, removal of right total knee implants, and placement  of antibiotic impregnated polymethylmethacrylate cement spacer 04/17/2020  Dr Ernest Pine  AMPUTATION LEG ABOVE KNEE AKA Right 12/02/2020  Procedure: AMPUTATION, THIGH, THROUGH FEMUR, ANY LEVEL; Surgeon: Starr Lake, MD; Location: [facility]; Service: Orthopedics; Laterality: Right;  TRANSFER ADJACENT TISSUE LEG Right 12/02/2020  Procedure: ADJACENT TISSUE TRANSFER OR REARRANGEMENT, LEG; DEFECT 10 SQ CM OR LESS; Surgeon: Starr Lake, MD; Location: [facility]; Service: Orthopedics; Laterality: Right;  ASPIRATION/INJECTION MAJOR JOINT/BURSA KNEE Left 12/02/2020  Procedure: ARTHROCENTESIS, ASPIRATION AND/OR INJECTION, MAJOR JOINT OR BURSA, KNEE; WITHOUT ULTRASOUND GUIDANCE; Surgeon: Starr Lake, MD; Location: [facility]; Service: Orthopedics; Laterality: Left;  REMOVAL KNEE PROSTHESIS W/POSSIBLE PLACEMENT SPACER Left 01/18/2021  Procedure: REMOVAL OF PROSTHESIS, INCLUDING TOTAL KNEE PROSTHESIS, METHYLMETHACRYLATE WITH OR WITHOUT INSERTION OF SPACER, KNEE; Surgeon: Celine Mans, MD; Location: [facility]; Service: Orthopedics; Laterality: Left;  INSERTION NON-BIODEGRADABLE DRUG DELIVERY IMPLANT Left 01/18/2021  Procedure: INSERTION, KNEE, bioresorbable, biodegradable, NON-BIODEGRADABLE DRUG DELIVERY IMPLANT; Surgeon: Celine Mans, MD; Location: [facility]; Service: Orthopedics; Laterality: Left;  ABOVE KNEE LEG AMPUTATION  12/02/2020  APPENDECTOMY Est 1968  CESAREAN SECTION  CHOLECYSTECTOMY  FRACTURE SURGERY 1/21  HYSTERECTOMY partial  JOINT REPLACEMENT 2008 & 2009  TUBAL LIGATION 3/79   ALLERGIES: Allergies  Allergen Reactions  Singulair [Montelukast] Other (See Comments)  Gland swelling  Sulfa (Sulfonamide Antibiotics) Swelling  Oxycodone Dizziness and Other (See Comments)  Vancomycin Analogues Other (See Comments)  Ringing in ears  Avinza [Morphine] Itching  Darvocet A500 [Propoxyphene N-Acetaminophen] Nausea  Nickel Rash   Ultracet [Tramadol-Acetaminophen] Rash  Vicodin [Hydrocodone-Acetaminophen] Vomiting  Vioxx [Rofecoxib] Other (See Comments)  GI   CURRENT MEDICATIONS: Current Outpatient Medications: acetaminophen (TYLENOL) 500 MG tablet, Take 1,000 mg by mouth 3 (three) times a day Twice daily per patient., PRN Not Currently Taking aspirin 81 MG EC tablet, Take 1 tablet (81 mg total) by mouth at bedtime, Taking calcium carbonate-vitamin D3 (CALTRATE 600+D) 600 mg-10 mcg (400 unit) tablet, Take 0.5 tablets by mouth 2 (two) times daily, Taking cetirizine (ZYRTEC) 10 mg capsule, Take 1 capsule (10 mg total) by mouth once daily, Taking escitalopram oxalate (LEXAPRO) 10 MG tablet, Take 1 tablet (10 mg total) by mouth once daily, Taking estradioL (ESTRACE) 0.01 % (0.1 mg/gram) vaginal cream, Place 2 g vaginally twice a week, Taking folic acid/multivit,iron,miner (MULTIVIT-IRON-MIN-FOLIC ACID ORAL), Take 1 tablet by mouth once daily, Taking gabapentin (NEURONTIN) 300 MG capsule, Take 1 capsule (300 mg total) by mouth 3 (three) times daily Phantom limb pain, Taking melatonin 3 mg tablet, Take 1 tablet (3 mg total) by mouth at bedtime, Taking multivitamin with iron (COMPLETE MULTIVITAMIN-MINERAL) tablet, Take 1 tablet by mouth, Taking multivitamin with minerals, EYE, (PRESERVISION AREDS 2) soft gel capsule, Take 1 capsule by mouth 2 (two) times daily, Taking nystatin (MYCOSTATIN) 100,000 unit/gram powder, Apply small amount topically twice daily for rash under breasts, PRN Not Currently Taking pantoprazole (PROTONIX) 40 MG DR tablet, Take 1 tablet (40 mg total) by mouth 2 (two) times daily. Please call to schedule an appt. Thanks!, Taking polyethylene  glycol (MIRALAX) powder, Take 17 g by mouth once daily Mix in 4-8ounces of fluid prior to taking., Taking sennosides-docusate (SENOKOT-S) 8.6-50 mg tablet, Take 1 tablet by mouth 2 (two) times daily For constipation, Taking lactose-reduced food (ENSURE PLUS ORAL), Take  237 mLs by mouth 2 (two) times daily With breakfast and dinner  HPI   CLINICAL SUMMARY:  Patient post right AKA and working with prosthesis. 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 Able to do housework, improving  PAIN:  Are you having pain? Yes: NPRS scale: 5/10 Pain location: L knee Pain description: aching Aggravating factors: walking Relieving factors: rest  PRECAUTIONS: None  WEIGHT BEARING RESTRICTIONS No  FALLS:  Has patient fallen in last 6 months? No  LIVING ENVIRONMENT: Lives with: lives with their spouse Lives in: House/apartment Stairs: No Has following equipment at home: Dan Humphreys - 4 wheeled and Wheelchair (manual)  OCCUPATION: retired  PLOF: Requires assistive device for independence  PATIENT GOALS  improve standing tolerance/ pull up pants/ ambulate with SPC safely.    OBJECTIVE:   PATIENT SURVEYS:  FOTO initial 46/ goal 36.   11/2: 50  COGNITION:  Overall cognitive status: Within functional limits for tasks assessed     SENSATION: WFL  POSTURE: rounded shoulders, forward head, flexed trunk , and weight shift left  PALPATION: No tenderness along R distal residual limb.  Discussed phantom limb sensation.    LOWER EXTREMITY ROM:  B LE AROM WFL except L knee extension secondary to articulating spacer.  Did not assess L/R hip extension at this time.    LOWER EXTREMITY MMT:  MMT Right eval Left eval  Hip flexion 4/5 4/5  Hip extension    Hip abduction 4+/5 4+/5  Hip adduction 4+/5 4+/5  Hip internal rotation    Hip external rotation    Knee flexion N/A 4+/5  Knee extension N/A 4+/5  Ankle dorsiflexion  5/5  Ankle plantarflexion    Ankle inversion    Ankle eversion     (Blank rows = not tested)  GAIT: Distance walked: in gym/ //-bars Assistive device utilized: Environmental consultant - 2 wheeled Level of assistance: CGA Comments: Cuing for posture correction with mirror feedback.  Flexed posture/ heavy UE assist.     TUG: UE assist required to stand/ use of RW.  51.9 seconds.  8/20: TUG: 48.9 sec. (Requires UE assist on arm rests and extra time to initiate movement after standing).   TODAY'S TREATMENT:  11/16/2022  Subjective:  Pt. Went to beach for 2x for a week over past several weeks.  Pt. Has not donned prosthetic leg or walked since last PT visit.    Objective:   There.ex.:     Nustep L5 10+ mins (0.5 miles) B UE/LE (consistent cadence/ no residual limb pain or tenderness reported).              Gait training:    Amb. From blue mat table to Nustep with use of RW and focus on step through gait.  SBA/mod. I on R to ensure R knee mechanism locks out before wt. Bearing.  No verbal cuing required and consistent gait pattern/ step pattern.        Walking in PT gym working on consistent step pattern while maintaining upright posture.  Heavy UE assist on RW from stairs with front of clinic.  Pt. Very fatigued and requires use of w/c to get to car.        PATIENT EDUCATION:  Education  details: HEP/ gait and prosthetic training.  Person educated: Patient and Spouse Education method: Explanation, Demonstration, and Verbal cues Education comprehension: verbalized understanding, returned demonstration, and tactile cues required   HOME EXERCISE PROGRAM: Prone position hip stretches/ Standing marching at kitchen counter with w/c behind patient.      ASSESSMENT:  CLINICAL IMPRESSION:    Pt. continues to ambulate with good BOS/step pattern with no episodes of R knee mechanism buckling this session. Pts. Prosthetic leg continues to fit well which allows for improved gait/ standing tolerance.  Pt. Demonstrates ability to ascend/descend stairs with step to pattern and use of forearm crutch.  Patient will continue to benefit from skilled PT services to improve standing tolerance/ gait independence and ADL.  OBJECTIVE IMPAIRMENTS Abnormal gait, decreased activity tolerance, decreased balance, decreased  endurance, decreased mobility, difficulty walking, decreased ROM, decreased strength, decreased safety awareness, impaired flexibility, improper body mechanics, prosthetic dependency , and pain.   ACTIVITY LIMITATIONS carrying, lifting, standing, squatting, stairs, transfers, bed mobility, bathing, toileting, dressing, hygiene/grooming, and locomotion level  PARTICIPATION LIMITATIONS: cleaning, laundry, driving, shopping, community activity, and yard work  PERSONAL FACTORS Past/current experiences are also affecting patient's functional outcome.   REHAB POTENTIAL: Good  CLINICAL DECISION MAKING: Evolving/moderate complexity  EVALUATION COMPLEXITY: High   GOALS: Goals reviewed with patient Yes  LONG TERM GOALS: Target date: 11/02/22  Pt. Will increase FOTO to 54 to improve functional mobility.  Baseline: initial FOTO 46.  11/2: 50.  4/25: 60  Goal status: Goal met  2.  Pt. Able to tolerate standing 10 minutes while pulling up pants with mod. I safely while wearing prosthetic leg to improve daily activities.  Baseline:  limited standing tolerance Goal status: Partially met  3.  Pt. Will ambulate 200 feet with use of least assistive device and mod. I to promote safety with household tasks/ walking into bathroom.   Baseline:  amb. With RW and min. A for safety.  2/27: walking in clinic with SBA/mod. I for 100 feet with RW and use of prosthetic leg.  6/11: amb. With RW or rollator <100 feet with SBA for RW/ CGA for rollator. 7/18: 120' with SBA with RW/CGA for rollator Goal status: Ongoing  4.  Pt. Will ascend 10 stairs with use of single forearm crutch/ handrail and step to pattern with mod. I safely.  Baseline:  see above; 7/18: ascends 4 stairs with step to pattern, with B rails, and CGA Goal status: Ongoing  PLAN: PT FREQUENCY: 2x/week  PT DURATION: 8 weeks  PLANNED INTERVENTIONS: Therapeutic exercises, Therapeutic activity, Neuromuscular re-education, Balance training, Gait  training, Patient/Family education, Self Care, Joint mobilization, Prosthetic training, DME instructions, and Manual therapy  PLAN FOR NEXT SESSION:  Discuss beach trip   Cammie Mcgee, PT, DPT # [phone] Physical Therapist - [facility]  [facility]  9:43 AM,11/18/22

## 2022-11-18 ENCOUNTER — Ambulatory Visit: Payer: Medicare Other | Admitting: Physical Therapy

## 2022-11-18 DIAGNOSIS — R269 Unspecified abnormalities of gait and mobility: Secondary | ICD-10-CM | POA: Diagnosis not present

## 2022-11-18 DIAGNOSIS — Z89611 Acquired absence of right leg above knee: Secondary | ICD-10-CM

## 2022-11-18 DIAGNOSIS — R293 Abnormal posture: Secondary | ICD-10-CM

## 2022-11-18 DIAGNOSIS — M6281 Muscle weakness (generalized): Secondary | ICD-10-CM

## 2022-11-19 NOTE — Therapy (Signed)
OUTPATIENT PHYSICAL THERAPY LOWER EXTREMITY TREATMENT  Patient Name: CANEI STROHMEIER MRN: [id number] DOB:Mar 25, 1945, 77 y.o., female Today's Date: 11/18/2022   PT End of Session - 11/19/22 2003     Visit Number 87    Number of Visits 92    Date for PT Re-Evaluation 12/28/22    PT Start Time 1019    PT Stop Time 1111    PT Time Calculation (min) 52 min    Activity Tolerance Patient tolerated treatment well    Behavior During Therapy WFL for tasks assessed/performed             Past Medical History:  Diagnosis Date   Anemia    Anxiety    Asthma    seasonal    GERD (gastroesophageal reflux disease)    H/O above knee amputation, 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   Hypertension    PONV (postoperative nausea and vomiting)    Seasonal allergies    Sleep apnea    does not uses CPAP machine anymore    Wheelchair dependent    able to self transfer   Past Surgical History:  Procedure Laterality Date   APPENDECTOMY     CATARACT EXTRACTION W/PHACO Right 02/28/2022   Procedure: CATARACT EXTRACTION PHACO AND INTRAOCULAR LENS PLACEMENT (IOC) RIGHT  4.82  00:30.9;  Surgeon: Nevada Crane, MD;  Location: [facility];  Service: Ophthalmology;  Laterality: Right;  sleep apnea   CATARACT EXTRACTION W/PHACO Left 03/14/2022   Procedure: CATARACT EXTRACTION PHACO AND INTRAOCULAR LENS PLACEMENT (IOC) LEFT;  Surgeon: Nevada Crane, MD;  Location: [facility];  Service: Ophthalmology;  Laterality: Left;  4.32 0:39.7   CESAREAN SECTION     CHOLECYSTECTOMY     DILATION AND CURETTAGE OF UTERUS     ESOPHAGOGASTRODUODENOSCOPY N/A 02/22/2019   Procedure: ESOPHAGOGASTRODUODENOSCOPY (EGD);  Surgeon: Toledo, Boykin Nearing, MD;  Location: [facility];  Service: Gastroenterology;  Laterality: N/A;   FLEXIBLE SIGMOIDOSCOPY N/A 02/22/2019   Procedure: FLEXIBLE SIGMOIDOSCOPY;  Surgeon: Toledo, Boykin Nearing, MD;  Location: [facility];  Service: Gastroenterology;  Laterality: N/A;   HARDWARE  REMOVAL Right 11/12/2019   Procedure: Right ankle hardware removal;  Surgeon: Kennedy Bucker, MD;  Location: [facility];  Service: Orthopedics;  Laterality: Right;   HARDWARE REMOVAL Right 04/17/2020   Procedure: HARDWARE REMOVAL FROM RIGHT ANKLE AND RIGHT KNEE; IMPLANT OF CEMENT SPACERS IN RIGHT KNEE;  Surgeon: Donato Heinz, MD;  Location: [facility];  Service: Orthopedics;  Laterality: Right;   JOINT REPLACEMENT     LEG AMPUTATION ABOVE KNEE Right 12/02/2020   [facility]   ORIF ANKLE FRACTURE Right 03/14/2019   Procedure: OPEN REDUCTION INTERNAL FIXATION (ORIF) ANKLE FRACTURE, MEDIAL MALLEOLUS;  Surgeon: Kennedy Bucker, MD;  Location: [facility];  Service: Orthopedics;  Laterality: Right;   ORIF ANKLE FRACTURE Right 02/08/2019   Procedure: OPEN REDUCTION INTERNAL FIXATION (ORIF) ANKLE FRACTURE, post malleolus;  Surgeon: Kennedy Bucker, MD;  Location: [facility];  Service: Orthopedics;  Laterality: Right;   PARTIAL HYSTERECTOMY  1993   REPLACEMENT TOTAL KNEE Bilateral 2008   2009   SCAR DEBRIDEMENT OF TOTAL KNEE  01/2020   SYNDESMOSIS REPAIR Right 02/08/2019   Procedure: SYNDESMOSIS REPAIR;  Surgeon: Kennedy Bucker, MD;  Location: [facility];  Service: Orthopedics;  Laterality: Right;   TEE WITHOUT CARDIOVERSION N/A 02/26/2019   Procedure: TRANSESOPHAGEAL ECHOCARDIOGRAM (TEE);  Surgeon: Lamar Blinks, MD;  Location: [facility];  Service: Cardiovascular;  Laterality: N/A;   TOTAL KNEE REVISION WITH SCAR DEBRIDEMENT/PATELLA REVISION WITH POLY  EXCHANGE Right 01/24/2020   Procedure: TOTAL KNEE REVISION WITH SCAR DEBRIDEMENT/PATELLA REVISION WITH POLY EXCHANGE;  Surgeon: Donato Heinz, MD;  Location: [facility];  Service: Orthopedics;  Laterality: Right;   TUMOR REMOVAL     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   Patient Active Problem List   Diagnosis Date Noted   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   Anxiety 04/17/2020   History of GI diverticular bleed 04/17/2020   Chest pain 04/17/2020    PAD (peripheral artery disease) (HCC) 04/05/2020   Ulcer of right leg (HCC) 04/05/2020   COPD (chronic obstructive pulmonary disease) (HCC) 04/05/2020   GERD (gastroesophageal reflux disease) 04/05/2020   Abscess of right knee 01/22/2020   Pressure injury of skin 02/28/2019   SOB (shortness of breath)    Gastrointestinal hemorrhage    Sepsis (HCC)    Symptomatic anemia 02/20/2019   Trimalleolar fracture of ankle, closed, right, initial encounter 02/07/2019   Multiple lung nodules 03/31/2014   Extrinsic asthma 03/27/2014   OSA on CPAP 03/27/2014    PCP: Danella Penton, MD  REFERRING PROVIDER: Danella Penton, MD  REFERRING DIAG: Above knee amputation of right lower extremity  THERAPY DIAG:  Gait difficulty  S/P AKA (above knee amputation) unilateral, right (HCC)  Muscle weakness (generalized)  Abnormal posture  Rationale for Evaluation and Treatment Rehabilitation  ONSET DATE: 12/02/20  SUBJECTIVE:  EVALUATION  PERTINENT HISTORY: Patient Profile:   Aoibheann Heesch is a 77 y.o. female Chief Complaint  Patient presents with  Visit Follow Up  Doing well.   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 Cardiac cath 10/2011 showed completely normal coronary arteries, however she did have mild left ventricular dysfunction with anterior wall motion abnormality. Left ventricular EF was 51%.   History of chickenpox  Hyperlipidemia  patient states she has never been diagnosed with this  Hypertension  Knee joint replacement by other means  Macular degeneration  Major depressive disorder, recurrent, mild (CMS-HCC)  Medicare annual wellness visit, initial 08/09/2019  7/21  Multiple thyroid nodules  Normal coronary arteries 09/06/2013  Normal coronary anatomy by cardiac catheterization 10/12/11  Osteoarthritis  Ovarian cyst  PONV (postoperative nausea and vomiting)  nausea  Postoperative urinary retention 12/08/2020  Rash on lips 12/21/2020  Seasonal allergies  Sinusitis, unspecified  Sleep apnea  On CPAP.  Slow transit constipation 12/10/2020  Trimalleolar fracture of ankle, closed, right, initial encounter 02/07/2019  Complicated by staph infection 1/21, 4 weeks IV Ancef, Menz  Ulcer   Past Surgical History:  Procedure Laterality Date  SALPINGO OOPHORECTOMY Bilateral 1993  Right total knee arthroplasty 10/11/2006  Dr Ernest Pine  Left total knee arthroplasty 10/12/2007  Dr Ernest Pine  COLONOSCOPY 07/30/2012  internal hemorrhoids, diverticulosis  ANKLE ARTHODESIS W/ ARTHROSCOPY Right 02/08/2019  03/14/2019 second surgery  COLONOSCOPY 02/22/2019  Blood in entire colon/Diverticulosis - Presumed diverticular bleed. No repeat recommended per TKT.  EGD 02/22/2019  Gastritis/gastric ulcers/Hiatal hernia/Otherwise normal - no repeat recommended per  TKT.  ORIF ANKLE FRACTURE Right 03/14/2019  Dr. Rosita Kea  REMOVAL HARDWARE ANKLE FOOT/TOES Right 11/12/2019  Dr. Rosita Kea  Right knee arthrotomy, irrigation and debridement of the right knee, polyethylene exchange 01/24/2020  Dr Ernest Pine  Removal of hardware (plates and screws) from the right ankle with irrigation, debridement, and placement of Stimulan antibiotic beads 04/17/2020  Dr Ernest Pine  Right knee arthrotomy, extensive irrigation debridement, removal of right total knee implants, and placement of antibiotic impregnated  polymethylmethacrylate cement spacer 04/17/2020  Dr Ernest Pine  AMPUTATION LEG ABOVE KNEE AKA Right 12/02/2020  Procedure: AMPUTATION, THIGH, THROUGH FEMUR, ANY LEVEL; Surgeon: Starr Lake, MD; Location: [facility]; Service: Orthopedics; Laterality: Right;  TRANSFER ADJACENT TISSUE LEG Right 12/02/2020  Procedure: ADJACENT TISSUE TRANSFER OR REARRANGEMENT, LEG; DEFECT 10 SQ CM OR LESS; Surgeon: Starr Lake, MD; Location: [facility]; Service: Orthopedics; Laterality: Right;  ASPIRATION/INJECTION MAJOR JOINT/BURSA KNEE Left 12/02/2020  Procedure: ARTHROCENTESIS, ASPIRATION AND/OR INJECTION, MAJOR JOINT OR BURSA, KNEE; WITHOUT ULTRASOUND GUIDANCE; Surgeon: Starr Lake, MD; Location: [facility]; Service: Orthopedics; Laterality: Left;  REMOVAL KNEE PROSTHESIS W/POSSIBLE PLACEMENT SPACER Left 01/18/2021  Procedure: REMOVAL OF PROSTHESIS, INCLUDING TOTAL KNEE PROSTHESIS, METHYLMETHACRYLATE WITH OR WITHOUT INSERTION OF SPACER, KNEE; Surgeon: Celine Mans, MD; Location: [facility]; Service: Orthopedics; Laterality: Left;  INSERTION NON-BIODEGRADABLE DRUG DELIVERY IMPLANT Left 01/18/2021  Procedure: INSERTION, KNEE, bioresorbable, biodegradable, NON-BIODEGRADABLE DRUG DELIVERY IMPLANT; Surgeon: Celine Mans, MD; Location: [facility]; Service: Orthopedics; Laterality: Left;  ABOVE KNEE LEG AMPUTATION  12/02/2020  APPENDECTOMY Est 1968  CESAREAN SECTION  CHOLECYSTECTOMY  FRACTURE SURGERY 1/21  HYSTERECTOMY partial  JOINT REPLACEMENT 2008 & 2009  TUBAL LIGATION 3/79   ALLERGIES: Allergies  Allergen Reactions  Singulair [Montelukast] Other (See Comments)  Gland swelling  Sulfa (Sulfonamide Antibiotics) Swelling  Oxycodone Dizziness and Other (See Comments)  Vancomycin Analogues Other (See Comments)  Ringing in ears  Avinza [Morphine] Itching  Darvocet A500 [Propoxyphene N-Acetaminophen] Nausea  Nickel Rash  Ultracet  [Tramadol-Acetaminophen] Rash  Vicodin [Hydrocodone-Acetaminophen] Vomiting  Vioxx [Rofecoxib] Other (See Comments)  GI   CURRENT MEDICATIONS: Current Outpatient Medications: acetaminophen (TYLENOL) 500 MG tablet, Take 1,000 mg by mouth 3 (three) times a day Twice daily per patient., PRN Not Currently Taking aspirin 81 MG EC tablet, Take 1 tablet (81 mg total) by mouth at bedtime, Taking calcium carbonate-vitamin D3 (CALTRATE 600+D) 600 mg-10 mcg (400 unit) tablet, Take 0.5 tablets by mouth 2 (two) times daily, Taking cetirizine (ZYRTEC) 10 mg capsule, Take 1 capsule (10 mg total) by mouth once daily, Taking escitalopram oxalate (LEXAPRO) 10 MG tablet, Take 1 tablet (10 mg total) by mouth once daily, Taking estradioL (ESTRACE) 0.01 % (0.1 mg/gram) vaginal cream, Place 2 g vaginally twice a week, Taking folic acid/multivit,iron,miner (MULTIVIT-IRON-MIN-FOLIC ACID ORAL), Take 1 tablet by mouth once daily, Taking gabapentin (NEURONTIN) 300 MG capsule, Take 1 capsule (300 mg total) by mouth 3 (three) times daily Phantom limb pain, Taking melatonin 3 mg tablet, Take 1 tablet (3 mg total) by mouth at bedtime, Taking multivitamin with iron (COMPLETE MULTIVITAMIN-MINERAL) tablet, Take 1 tablet by mouth, Taking multivitamin with minerals, EYE, (PRESERVISION AREDS 2) soft gel capsule, Take 1 capsule by mouth 2 (two) times daily, Taking nystatin (MYCOSTATIN) 100,000 unit/gram powder, Apply small amount topically twice daily for rash under breasts, PRN Not Currently Taking pantoprazole (PROTONIX) 40 MG DR tablet, Take 1 tablet (40 mg total) by mouth 2 (two) times daily. Please call to schedule an appt. Thanks!, Taking polyethylene glycol (MIRALAX)  powder, Take 17 g by mouth once daily Mix in 4-8ounces of fluid prior to taking., Taking sennosides-docusate (SENOKOT-S) 8.6-50 mg tablet, Take 1 tablet by mouth 2 (two) times daily For constipation, Taking lactose-reduced food (ENSURE PLUS ORAL), Take 237 mLs by  mouth 2 (two) times daily With breakfast and dinner  HPI   CLINICAL SUMMARY:  Patient post right AKA and working with prosthesis. 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 Able to do housework, improving  PAIN:  Are you having pain? Yes: NPRS scale: 5/10 Pain location: L knee Pain description: aching Aggravating factors: walking Relieving factors: rest  PRECAUTIONS: None  WEIGHT BEARING RESTRICTIONS No  FALLS:  Has patient fallen in last 6 months? No  LIVING ENVIRONMENT: Lives with: lives with their spouse Lives in: House/apartment Stairs: No Has following equipment at home: Dan Humphreys - 4 wheeled and Wheelchair (manual)  OCCUPATION: retired  PLOF: Requires assistive device for independence  PATIENT GOALS  improve standing tolerance/ pull up pants/ ambulate with SPC safely.    OBJECTIVE:   PATIENT SURVEYS:  FOTO initial 46/ goal 29.   11/2: 50  COGNITION:  Overall cognitive status: Within functional limits for tasks assessed     SENSATION: WFL  POSTURE: rounded shoulders, forward head, flexed trunk , and weight shift left  PALPATION: No tenderness along R distal residual limb.  Discussed phantom limb sensation.    LOWER EXTREMITY ROM:  B LE AROM WFL except L knee extension secondary to articulating spacer.  Did not assess L/R hip extension at this time.    LOWER EXTREMITY MMT:  MMT Right eval Left eval  Hip flexion 4/5 4/5  Hip extension    Hip abduction 4+/5 4+/5  Hip adduction 4+/5 4+/5  Hip internal rotation    Hip external rotation    Knee flexion N/A 4+/5  Knee extension N/A 4+/5  Ankle dorsiflexion  5/5  Ankle plantarflexion    Ankle inversion    Ankle eversion     (Blank rows = not tested)  GAIT: Distance walked: in gym/ //-bars Assistive device utilized: Environmental consultant - 2 wheeled Level of assistance: CGA Comments: Cuing for posture correction with mirror feedback.  Flexed posture/ heavy UE assist.    TUG: UE assist  required to stand/ use of RW.  51.9 seconds.  8/20: TUG: 48.9 sec. (Requires UE assist on arm rests and extra time to initiate movement after standing).   TODAY'S TREATMENT:  11/18/2022  Subjective:  Pt. Doing well today.  Pt. States swelling in L lower leg/ foot has improved since last tx. Session.  Pt. States she has been wearing R shrinker to manage residual limb swelling.      Objective:   There.ex.:  No charge   Nustep L5 10+ mins (0.5 miles) B UE/LE (consistent cadence/ no residual limb pain or tenderness reported).     There.act.:    Sit to stand transfers from blue mat table with mod. I.     Standing tolerance in front of blue mat table with SBA/CGA for safety.  Pt. Cued to decrease UE assist on RW and wt. Shift with LE.  Pt. Completed 10 minutes without seated rest break but reports of increase B shoulder/ R residual limb pain.     Gait training:    Amb. From blue mat table to Nustep with use of RW and focus on step through gait.  SBA/mod. I on R to ensure R knee mechanism locks out before wt. Bearing.  No verbal cuing required and consistent gait pattern/ step pattern.        Walking in PT gym/hallway working on consistent step pattern while maintaining upright posture.  Heavy UE assist on RW from stairs with front of clinic.  Pt. Able to amb. 44 feet prior to seated rest break today.  Pt. Remains limited with R residual limb pain.    PATIENT EDUCATION:  Education details: HEP/ gait and prosthetic training.  Person educated: Patient and Spouse Education method: Explanation, Demonstration, and Verbal cues Education comprehension: verbalized understanding, returned demonstration, and tactile cues required   HOME EXERCISE PROGRAM: Prone position hip stretches/ Standing marching at kitchen counter with w/c behind patient.      ASSESSMENT:  CLINICAL IMPRESSION:    Pt. continues to ambulate with good BOS/step pattern with no episodes of R knee mechanism buckling this  session.  Pts. Overall standing tolerance improved but was pain provoking in B shoulder/ R LE.  Pts. walking distance limited due to residual limb pain. Pts. Prosthetic leg fit better today as compared to last tx. Session.   PT really encouraged pt. To walk at home to improve carryover from PT.  Patient will continue to benefit from skilled PT services to improve standing tolerance/ gait independence and ADL.  OBJECTIVE IMPAIRMENTS Abnormal gait, decreased activity tolerance, decreased balance, decreased endurance, decreased mobility, difficulty walking, decreased ROM, decreased strength, decreased safety awareness, impaired flexibility, improper body mechanics, prosthetic dependency , and pain.   ACTIVITY LIMITATIONS carrying, lifting, standing, squatting, stairs, transfers, bed mobility, bathing, toileting, dressing, hygiene/grooming, and locomotion level  PARTICIPATION LIMITATIONS: cleaning, laundry, driving, shopping, community activity, and yard work  PERSONAL FACTORS Past/current experiences are also affecting patient's functional outcome.   REHAB POTENTIAL: Good  CLINICAL DECISION MAKING: Evolving/moderate complexity  EVALUATION COMPLEXITY: High   GOALS: Goals reviewed with patient Yes  LONG TERM GOALS: Target date: 12/27/22  Pt. Able to tolerate standing 10 minutes while pulling up pants with mod. I safely while wearing prosthetic leg to improve daily activities.  Baseline:  limited standing tolerance Goal status: Partially met  2.  Pt. Will ambulate 200 feet with use of least assistive device and mod. I to promote safety with household tasks/ walking into bathroom.   Baseline:  amb. With RW and min. A for safety.  2/27: walking in clinic with SBA/mod. I for 100 feet with RW and use of prosthetic leg.  6/11: amb. With RW or rollator <100 feet with SBA for RW/ CGA for rollator. 7/18: 120' with SBA with RW/CGA for rollator.  10/16: limited standing/walking tolerance today secondary  to limited prosthetic leg fitting.  Goal status: Not met  3.  Pt. Will ascend 10 stairs with use of single forearm crutch/ handrail and step to pattern with mod. I safely.  Baseline:  see above; 7/18: ascends 4 stairs with step to pattern, with B rails, and CGA. Goal status: Partially met  4.  Pt. Able to ambulate from car into PT clinic with use of RW and mod. I safely to limited use of wheelchair to improve independence/ mobility out of house.    PLAN: PT FREQUENCY: 1-2x/week  PT DURATION: 6 weeks  PLANNED INTERVENTIONS: Therapeutic exercises, Therapeutic activity, Neuromuscular re-education, Balance training, Gait training, Patient/Family education, Self Care, Joint mobilization, Prosthetic training, DME instructions, and Manual therapy  PLAN FOR NEXT SESSION:  Walking at home   Cammie Mcgee, PT, DPT # [phone] Physical Therapist - [place]  [facility] 8:06  PM,11/19/22

## 2022-11-24 ENCOUNTER — Ambulatory Visit: Payer: Medicare Other | Admitting: Physical Therapy

## 2022-11-28 ENCOUNTER — Ambulatory Visit: Payer: Medicare Other | Admitting: Physical Therapy

## 2022-11-28 ENCOUNTER — Encounter: Payer: Self-pay | Admitting: Physical Therapy

## 2022-11-28 DIAGNOSIS — R269 Unspecified abnormalities of gait and mobility: Secondary | ICD-10-CM | POA: Diagnosis not present

## 2022-11-28 DIAGNOSIS — M6281 Muscle weakness (generalized): Secondary | ICD-10-CM

## 2022-11-28 DIAGNOSIS — R293 Abnormal posture: Secondary | ICD-10-CM

## 2022-11-28 DIAGNOSIS — Z89611 Acquired absence of right leg above knee: Secondary | ICD-10-CM

## 2022-11-28 NOTE — Therapy (Unsigned)
OUTPATIENT PHYSICAL THERAPY LOWER EXTREMITY TREATMENT  Patient Name: Jasmine Morrow MRN: [id number] DOB:10-28-1945, 77 y.o., female Today's Date: 11/29/2022   PT End of Session - 11/28/22 1314     Visit Number 88    Number of Visits 92    Date for PT Re-Evaluation 12/28/22    PT Start Time 1300    PT Stop Time 1348    PT Time Calculation (min) 48 min    Equipment Utilized During Treatment Gait belt    Activity Tolerance Patient tolerated treatment well    Behavior During Therapy WFL for tasks assessed/performed              Past Medical History:  Diagnosis Date   Anemia    Anxiety    Asthma    seasonal    GERD (gastroesophageal reflux disease)    H/O above knee amputation, 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   Hypertension    PONV (postoperative nausea and vomiting)    Seasonal allergies    Sleep apnea    does not uses CPAP machine anymore    Wheelchair dependent    able to self transfer   Past Surgical History:  Procedure Laterality Date   APPENDECTOMY     CATARACT EXTRACTION W/PHACO Right 02/28/2022   Procedure: CATARACT EXTRACTION PHACO AND INTRAOCULAR LENS PLACEMENT (IOC) RIGHT  4.82  00:30.9;  Surgeon: Nevada Crane, MD;  Location: [facility];  Service: Ophthalmology;  Laterality: Right;  sleep apnea   CATARACT EXTRACTION W/PHACO Left 03/14/2022   Procedure: CATARACT EXTRACTION PHACO AND INTRAOCULAR LENS PLACEMENT (IOC) LEFT;  Surgeon: Nevada Crane, MD;  Location: [facility];  Service: Ophthalmology;  Laterality: Left;  4.32 0:39.7   CESAREAN SECTION     CHOLECYSTECTOMY     DILATION AND CURETTAGE OF UTERUS     ESOPHAGOGASTRODUODENOSCOPY N/A 02/22/2019   Procedure: ESOPHAGOGASTRODUODENOSCOPY (EGD);  Surgeon: Toledo, Boykin Nearing, MD;  Location: [facility];  Service: Gastroenterology;  Laterality: N/A;   FLEXIBLE SIGMOIDOSCOPY N/A 02/22/2019   Procedure: FLEXIBLE SIGMOIDOSCOPY;  Surgeon: Toledo, Boykin Nearing, MD;  Location: [facility];   Service: Gastroenterology;  Laterality: N/A;   HARDWARE REMOVAL Right 11/12/2019   Procedure: Right ankle hardware removal;  Surgeon: Kennedy Bucker, MD;  Location: [facility];  Service: Orthopedics;  Laterality: Right;   HARDWARE REMOVAL Right 04/17/2020   Procedure: HARDWARE REMOVAL FROM RIGHT ANKLE AND RIGHT KNEE; IMPLANT OF CEMENT SPACERS IN RIGHT KNEE;  Surgeon: Donato Heinz, MD;  Location: [facility];  Service: Orthopedics;  Laterality: Right;   JOINT REPLACEMENT     LEG AMPUTATION ABOVE KNEE Right 12/02/2020   [facility]   ORIF ANKLE FRACTURE Right 03/14/2019   Procedure: OPEN REDUCTION INTERNAL FIXATION (ORIF) ANKLE FRACTURE, MEDIAL MALLEOLUS;  Surgeon: Kennedy Bucker, MD;  Location: [facility];  Service: Orthopedics;  Laterality: Right;   ORIF ANKLE FRACTURE Right 02/08/2019   Procedure: OPEN REDUCTION INTERNAL FIXATION (ORIF) ANKLE FRACTURE, post malleolus;  Surgeon: Kennedy Bucker, MD;  Location: [facility];  Service: Orthopedics;  Laterality: Right;   PARTIAL HYSTERECTOMY  1993   REPLACEMENT TOTAL KNEE Bilateral 2008   2009   SCAR DEBRIDEMENT OF TOTAL KNEE  01/2020   SYNDESMOSIS REPAIR Right 02/08/2019   Procedure: SYNDESMOSIS REPAIR;  Surgeon: Kennedy Bucker, MD;  Location: [facility];  Service: Orthopedics;  Laterality: Right;   TEE WITHOUT CARDIOVERSION N/A 02/26/2019   Procedure: TRANSESOPHAGEAL ECHOCARDIOGRAM (TEE);  Surgeon: Lamar Blinks, MD;  Location: [facility];  Service: Cardiovascular;  Laterality: N/A;  TOTAL KNEE REVISION WITH SCAR DEBRIDEMENT/PATELLA REVISION WITH POLY EXCHANGE Right 01/24/2020   Procedure: TOTAL KNEE REVISION WITH SCAR DEBRIDEMENT/PATELLA REVISION WITH POLY EXCHANGE;  Surgeon: Donato Heinz, MD;  Location: [facility];  Service: Orthopedics;  Laterality: Right;   TUMOR REMOVAL     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   Patient Active Problem List   Diagnosis Date Noted   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   Anxiety 04/17/2020   History of GI  diverticular bleed 04/17/2020   Chest pain 04/17/2020   PAD (peripheral artery disease) (HCC) 04/05/2020   Ulcer of right leg (HCC) 04/05/2020   COPD (chronic obstructive pulmonary disease) (HCC) 04/05/2020   GERD (gastroesophageal reflux disease) 04/05/2020   Abscess of right knee 01/22/2020   Pressure injury of skin 02/28/2019   SOB (shortness of breath)    Gastrointestinal hemorrhage    Sepsis (HCC)    Symptomatic anemia 02/20/2019   Trimalleolar fracture of ankle, closed, right, initial encounter 02/07/2019   Multiple lung nodules 03/31/2014   Extrinsic asthma 03/27/2014   OSA on CPAP 03/27/2014    PCP: Danella Penton, MD  REFERRING PROVIDER: Danella Penton, MD  REFERRING DIAG: Above knee amputation of right lower extremity  THERAPY DIAG:  Gait difficulty  S/P AKA (above knee amputation) unilateral, right (HCC)  Muscle weakness (generalized)  Abnormal posture  Rationale for Evaluation and Treatment Rehabilitation  ONSET DATE: 12/02/20  SUBJECTIVE:  EVALUATION  PERTINENT HISTORY: Patient Profile:   Jasmine Morrow is a 77 y.o. female Chief Complaint  Patient presents with  Visit Follow Up  Doing well.   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 Cardiac cath 10/2011 showed completely normal coronary arteries, however she did have mild left ventricular dysfunction with anterior  wall motion abnormality. Left ventricular EF was 51%.  History of chickenpox  Hyperlipidemia  patient states she has never been diagnosed with this  Hypertension  Knee joint replacement by other means  Macular degeneration  Major depressive disorder, recurrent, mild (CMS-HCC)  Medicare annual wellness visit, initial 08/09/2019  7/21  Multiple thyroid nodules  Normal coronary arteries 09/06/2013  Normal coronary anatomy by cardiac catheterization 10/12/11  Osteoarthritis  Ovarian cyst  PONV (postoperative nausea and vomiting)  nausea  Postoperative urinary retention 12/08/2020  Rash on lips 12/21/2020  Seasonal allergies  Sinusitis, unspecified  Sleep apnea  On CPAP.  Slow transit constipation 12/10/2020  Trimalleolar fracture of ankle, closed, right, initial encounter 02/07/2019  Complicated by staph infection 1/21, 4 weeks IV Ancef, Menz  Ulcer   Past Surgical History:  Procedure Laterality Date  SALPINGO OOPHORECTOMY Bilateral 1993  Right total knee arthroplasty 10/11/2006  Dr Ernest Pine  Left total knee arthroplasty 10/12/2007  Dr Ernest Pine  COLONOSCOPY 07/30/2012  internal hemorrhoids, diverticulosis  ANKLE ARTHODESIS W/ ARTHROSCOPY Right 02/08/2019  03/14/2019 second surgery  COLONOSCOPY 02/22/2019  Blood in entire colon/Diverticulosis - Presumed diverticular bleed. No repeat recommended per TKT.  EGD 02/22/2019  Gastritis/gastric ulcers/Hiatal hernia/Otherwise normal - no repeat recommended per TKT.  ORIF ANKLE FRACTURE Right 03/14/2019  Dr. Rosita Kea  REMOVAL HARDWARE ANKLE FOOT/TOES Right 11/12/2019  Dr. Rosita Kea  Right knee arthrotomy, irrigation and debridement of the right knee, polyethylene exchange 01/24/2020  Dr Ernest Pine  Removal of hardware (plates and screws) from the right ankle with irrigation, debridement, and placement of Stimulan antibiotic beads 04/17/2020  Dr Ernest Pine  Right knee arthrotomy, extensive irrigation debridement, removal of right total knee implants, and  placement of antibiotic impregnated polymethylmethacrylate cement spacer 04/17/2020  Dr Ernest Pine  AMPUTATION LEG ABOVE KNEE AKA Right 12/02/2020  Procedure: AMPUTATION, THIGH, THROUGH FEMUR, ANY LEVEL; Surgeon: Starr Lake, MD; Location: [facility]; Service: Orthopedics; Laterality: Right;  TRANSFER ADJACENT TISSUE LEG Right 12/02/2020  Procedure: ADJACENT TISSUE TRANSFER OR REARRANGEMENT, LEG; DEFECT 10 SQ CM OR LESS; Surgeon: Starr Lake, MD; Location: [facility]; Service: Orthopedics; Laterality: Right;  ASPIRATION/INJECTION MAJOR JOINT/BURSA KNEE Left 12/02/2020  Procedure: ARTHROCENTESIS, ASPIRATION AND/OR INJECTION, MAJOR JOINT OR BURSA, KNEE; WITHOUT ULTRASOUND GUIDANCE; Surgeon: Starr Lake, MD; Location: [facility]; Service: Orthopedics; Laterality: Left;  REMOVAL KNEE PROSTHESIS W/POSSIBLE PLACEMENT SPACER Left 01/18/2021  Procedure: REMOVAL OF PROSTHESIS, INCLUDING TOTAL KNEE PROSTHESIS, METHYLMETHACRYLATE WITH OR WITHOUT INSERTION OF SPACER, KNEE; Surgeon: Celine Mans, MD; Location: [facility]; Service: Orthopedics; Laterality: Left;  INSERTION NON-BIODEGRADABLE DRUG DELIVERY IMPLANT Left 01/18/2021  Procedure: INSERTION, KNEE, bioresorbable, biodegradable, NON-BIODEGRADABLE DRUG DELIVERY IMPLANT; Surgeon: Celine Mans, MD; Location: [facility]; Service: Orthopedics; Laterality: Left;  ABOVE KNEE LEG AMPUTATION  12/02/2020  APPENDECTOMY Est 1968  CESAREAN SECTION  CHOLECYSTECTOMY  FRACTURE SURGERY 1/21  HYSTERECTOMY partial  JOINT REPLACEMENT 2008 & 2009  TUBAL LIGATION 3/79   ALLERGIES: Allergies  Allergen Reactions  Singulair [Montelukast] Other (See Comments)  Gland swelling  Sulfa (Sulfonamide Antibiotics) Swelling  Oxycodone Dizziness and Other (See Comments)  Vancomycin Analogues Other (See Comments)  Ringing in ears  Avinza [Morphine] Itching  Darvocet A500 [Propoxyphene N-Acetaminophen] Nausea  Nickel  Rash  Ultracet [Tramadol-Acetaminophen] Rash  Vicodin [Hydrocodone-Acetaminophen] Vomiting  Vioxx [Rofecoxib] Other (See Comments)  GI   CURRENT MEDICATIONS: Current Outpatient Medications: acetaminophen (TYLENOL) 500 MG tablet, Take 1,000 mg by mouth 3 (three) times a day Twice daily per patient., PRN Not Currently Taking aspirin 81 MG EC tablet, Take 1 tablet (81 mg total) by mouth at bedtime, Taking calcium carbonate-vitamin D3 (CALTRATE 600+D) 600 mg-10 mcg (400 unit) tablet, Take 0.5 tablets by mouth 2 (two) times daily, Taking cetirizine (ZYRTEC) 10 mg capsule, Take 1 capsule (10 mg total) by mouth once daily, Taking escitalopram oxalate (LEXAPRO) 10 MG tablet, Take 1 tablet (10 mg total) by mouth once daily, Taking estradioL (ESTRACE) 0.01 % (0.1 mg/gram) vaginal cream, Place 2 g vaginally twice a week, Taking folic acid/multivit,iron,miner (MULTIVIT-IRON-MIN-FOLIC ACID ORAL), Take 1 tablet by mouth once daily, Taking gabapentin (NEURONTIN) 300 MG capsule, Take 1 capsule (300 mg total) by mouth 3 (three) times daily Phantom limb pain, Taking melatonin 3 mg tablet, Take 1 tablet (3 mg total) by mouth at bedtime, Taking multivitamin with iron (COMPLETE MULTIVITAMIN-MINERAL) tablet, Take 1 tablet by mouth, Taking multivitamin with minerals, EYE, (PRESERVISION AREDS 2) soft gel capsule, Take 1 capsule by mouth 2 (two) times daily, Taking nystatin (MYCOSTATIN) 100,000 unit/gram powder, Apply small amount topically twice daily for rash under breasts, PRN Not Currently Taking pantoprazole (PROTONIX) 40 MG DR tablet, Take 1 tablet (40 mg total) by mouth 2 (two) times daily. Please call  to schedule an appt. Thanks!, Taking polyethylene glycol (MIRALAX) powder, Take 17 g by mouth once daily Mix in 4-8ounces of fluid prior to taking., Taking sennosides-docusate (SENOKOT-S) 8.6-50 mg tablet, Take 1 tablet by mouth 2 (two) times daily For constipation, Taking lactose-reduced food (ENSURE PLUS ORAL),  Take 237 mLs by mouth 2 (two) times daily With breakfast and dinner  HPI   CLINICAL SUMMARY:  Patient post right AKA and working with prosthesis. 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 Able to do housework, improving  PAIN:  Are you having pain? Yes: NPRS scale: 5/10 Pain location: L knee Pain description: aching Aggravating factors: walking Relieving factors: rest  PRECAUTIONS: None  WEIGHT BEARING RESTRICTIONS No  FALLS:  Has patient fallen in last 6 months? No  LIVING ENVIRONMENT: Lives with: lives with their spouse Lives in: House/apartment Stairs: No Has following equipment at home: Dan Humphreys - 4 wheeled and Wheelchair (manual)  OCCUPATION: retired  PLOF: Requires assistive device for independence  PATIENT GOALS  improve standing tolerance/ pull up pants/ ambulate with SPC safely.    OBJECTIVE:   PATIENT SURVEYS:  FOTO initial 46/ goal 55.   11/2: 50  COGNITION:  Overall cognitive status: Within functional limits for tasks assessed     SENSATION: WFL  POSTURE: rounded shoulders, forward head, flexed trunk , and weight shift left  PALPATION: No tenderness along R distal residual limb.  Discussed phantom limb sensation.    LOWER EXTREMITY ROM:  B LE AROM WFL except L knee extension secondary to articulating spacer.  Did not assess L/R hip extension at this time.    LOWER EXTREMITY MMT:  MMT Right eval Left eval  Hip flexion 4/5 4/5  Hip extension    Hip abduction 4+/5 4+/5  Hip adduction 4+/5 4+/5  Hip internal rotation    Hip external rotation    Knee flexion N/A 4+/5  Knee extension N/A 4+/5  Ankle dorsiflexion  5/5  Ankle plantarflexion    Ankle inversion    Ankle eversion     (Blank rows = not tested)  GAIT: Distance walked: in gym/ //-bars Assistive device utilized: Environmental consultant - 2 wheeled Level of assistance: CGA Comments: Cuing for posture correction with mirror feedback.  Flexed posture/ heavy UE assist.     TUG: UE assist required to stand/ use of RW.  51.9 seconds.  8/20: TUG: 48.9 sec. (Requires UE assist on arm rests and extra time to initiate movement after standing).   TODAY'S TREATMENT:  11/29/2022  Subjective:  Pt reports she is doing well today. She says the swelling in her leg is continuing to improve. She says she accidentally ran over her L foot with the w/c this morning and it is bothering her a little.    Objective:   There.ex.:  No charge   Nustep L5 10+ mins (0.5 miles) B UE/LE (consistent cadence/ no residual limb pain or tenderness reported)    There.act.:    Sit to stand transfers from blue mat table with min A     Gait training:    Amb. From blue mat table to Nustep with use of RW and focus on step through gait.  SBA/mod. I on R to ensure R knee mechanism locks out before wt. Bearing.  No verbal cuing required and consistent gait pattern/ step pattern.        Ambulation in hallway with RW: min A, 3x~45 feet - trialed 1 bout with rollator but pt doesn't like  it due to front wheels being harder to control  PATIENT EDUCATION:  Education details: HEP/ gait and prosthetic training.  Person educated: Patient and Spouse Education method: Explanation, Demonstration, and Verbal cues Education comprehension: verbalized understanding, returned demonstration, and tactile cues required   HOME EXERCISE PROGRAM: Prone position hip stretches/ Standing marching at kitchen counter with w/c behind patient.      ASSESSMENT:  CLINICAL IMPRESSION:    Pt reports to PT with mild soreness in her L foot after running it over with the wheelchair this morning; swelling and soreness in her residual limb is improved. Pt continues to do well demonstrating symmetrical step length bilaterally with good heelstrike on her prosthetic side. Pt's walking distances continues to be limited due to increased UE fatigue; pt reports still having trouble trusting and weightbearing on the residual limb.  Future sessions will focus on improving pt's standing tolerance and ability to fully weightbear on her prosthetic limb. Patient will continue to benefit from skilled PT services to improve standing tolerance/ gait independence and ADL.  OBJECTIVE IMPAIRMENTS Abnormal gait, decreased activity tolerance, decreased balance, decreased endurance, decreased mobility, difficulty walking, decreased ROM, decreased strength, decreased safety awareness, impaired flexibility, improper body mechanics, prosthetic dependency , and pain.   ACTIVITY LIMITATIONS carrying, lifting, standing, squatting, stairs, transfers, bed mobility, bathing, toileting, dressing, hygiene/grooming, and locomotion level  PARTICIPATION LIMITATIONS: cleaning, laundry, driving, shopping, community activity, and yard work  PERSONAL FACTORS Past/current experiences are also affecting patient's functional outcome.   REHAB POTENTIAL: Good  CLINICAL DECISION MAKING: Evolving/moderate complexity  EVALUATION COMPLEXITY: High   GOALS: Goals reviewed with patient Yes  LONG TERM GOALS: Target date: 12/27/22  Pt. Able to tolerate standing 10 minutes while pulling up pants with mod. I safely while wearing prosthetic leg to improve daily activities.  Baseline:  limited standing tolerance Goal status: Partially met  2.  Pt. Will ambulate 200 feet with use of least assistive device and mod. I to promote safety with household tasks/ walking into bathroom.   Baseline:  amb. With RW and min. A for safety.  2/27: walking in clinic with SBA/mod. I for 100 feet with RW and use of prosthetic leg.  6/11: amb. With RW or rollator <100 feet with SBA for RW/ CGA for rollator. 7/18: 120' with SBA with RW/CGA for rollator.  10/16: limited standing/walking tolerance today secondary to limited prosthetic leg fitting.  Goal status: Not met  3.  Pt. Will ascend 10 stairs with use of single forearm crutch/ handrail and step to pattern with mod. I safely.   Baseline:  see above; 7/18: ascends 4 stairs with step to pattern, with B rails, and CGA. Goal status: Partially met  4.  Pt. Able to ambulate from car into PT clinic with use of RW and mod. I safely to limited use of wheelchair to improve independence/ mobility out of house.    PLAN: PT FREQUENCY: 1-2x/week  PT DURATION: 6 weeks  PLANNED INTERVENTIONS: Therapeutic exercises, Therapeutic activity, Neuromuscular re-education, Balance training, Gait training, Patient/Family education, Self Care, Joint mobilization, Prosthetic training, DME instructions, and Manual therapy  PLAN FOR NEXT SESSION:  walking at home carryover, static balance activities in // bars to reinforce weightbearing on prosthetic side, functional reach, ambulation practice with RW  Cammie Mcgee, PT, DPT # [id number] Cena Benton, SPT 11/28/22, 2:37 PM

## 2022-12-09 ENCOUNTER — Ambulatory Visit: Payer: Medicare Other | Admitting: Physical Therapy

## 2022-12-14 ENCOUNTER — Ambulatory Visit: Payer: Medicare Other | Attending: Internal Medicine | Admitting: Physical Therapy

## 2022-12-14 ENCOUNTER — Encounter: Payer: Self-pay | Admitting: Physical Therapy

## 2022-12-14 DIAGNOSIS — R269 Unspecified abnormalities of gait and mobility: Secondary | ICD-10-CM | POA: Insufficient documentation

## 2022-12-14 DIAGNOSIS — R293 Abnormal posture: Secondary | ICD-10-CM | POA: Diagnosis present

## 2022-12-14 DIAGNOSIS — M6281 Muscle weakness (generalized): Secondary | ICD-10-CM | POA: Diagnosis present

## 2022-12-14 DIAGNOSIS — Z89611 Acquired absence of right leg above knee: Secondary | ICD-10-CM | POA: Diagnosis present

## 2022-12-14 NOTE — Therapy (Signed)
OUTPATIENT PHYSICAL THERAPY LOWER EXTREMITY TREATMENT  Patient Name: Jasmine Morrow MRN: [id number] DOB:03-11-1945, 77 y.o., female Today's Date: 12/14/2022   PT End of Session - 12/14/22 1052     Visit Number 89    Number of Visits 92    Date for PT Re-Evaluation 12/28/22    PT Start Time 1033    PT Stop Time 1120    PT Time Calculation (min) 47 min    Equipment Utilized During Treatment Gait belt    Activity Tolerance Patient tolerated treatment well    Behavior During Therapy WFL for tasks assessed/performed            Past Medical History:  Diagnosis Date   Anemia    Anxiety    Asthma    seasonal    GERD (gastroesophageal reflux disease)    H/O above knee amputation, 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   Hypertension    PONV (postoperative nausea and vomiting)    Seasonal allergies    Sleep apnea    does not uses CPAP machine anymore    Wheelchair dependent    able to self transfer   Past Surgical History:  Procedure Laterality Date   APPENDECTOMY     CATARACT EXTRACTION W/PHACO Right 02/28/2022   Procedure: CATARACT EXTRACTION PHACO AND INTRAOCULAR LENS PLACEMENT (IOC) RIGHT  4.82  00:30.9;  Surgeon: Jasmine Crane, MD;  Location: [facility];  Service: Ophthalmology;  Laterality: Right;  sleep apnea   CATARACT EXTRACTION W/PHACO Left 03/14/2022   Procedure: CATARACT EXTRACTION PHACO AND INTRAOCULAR LENS PLACEMENT (IOC) LEFT;  Surgeon: Jasmine Crane, MD;  Location: [facility];  Service: Ophthalmology;  Laterality: Left;  4.32 0:39.7   CESAREAN SECTION     CHOLECYSTECTOMY     DILATION AND CURETTAGE OF UTERUS     ESOPHAGOGASTRODUODENOSCOPY N/A 02/22/2019   Procedure: ESOPHAGOGASTRODUODENOSCOPY (EGD);  Surgeon: Toledo, Boykin Nearing, MD;  Location: [facility];  Service: Gastroenterology;  Laterality: N/A;   FLEXIBLE SIGMOIDOSCOPY N/A 02/22/2019   Procedure: FLEXIBLE SIGMOIDOSCOPY;  Surgeon: Toledo, Boykin Nearing, MD;  Location: [facility];  Service:  Gastroenterology;  Laterality: N/A;   HARDWARE REMOVAL Right 11/12/2019   Procedure: Right ankle hardware removal;  Surgeon: Jasmine Bucker, MD;  Location: [facility];  Service: Orthopedics;  Laterality: Right;   HARDWARE REMOVAL Right 04/17/2020   Procedure: HARDWARE REMOVAL FROM RIGHT ANKLE AND RIGHT KNEE; IMPLANT OF CEMENT SPACERS IN RIGHT KNEE;  Surgeon: Jasmine Heinz, MD;  Location: [facility];  Service: Orthopedics;  Laterality: Right;   JOINT REPLACEMENT     LEG AMPUTATION ABOVE KNEE Right 12/02/2020   [facility]   ORIF ANKLE FRACTURE Right 03/14/2019   Procedure: OPEN REDUCTION INTERNAL FIXATION (ORIF) ANKLE FRACTURE, MEDIAL MALLEOLUS;  Surgeon: Jasmine Bucker, MD;  Location: [facility];  Service: Orthopedics;  Laterality: Right;   ORIF ANKLE FRACTURE Right 02/08/2019   Procedure: OPEN REDUCTION INTERNAL FIXATION (ORIF) ANKLE FRACTURE, post malleolus;  Surgeon: Jasmine Bucker, MD;  Location: [facility];  Service: Orthopedics;  Laterality: Right;   PARTIAL HYSTERECTOMY  1993   REPLACEMENT TOTAL KNEE Bilateral 2008   2009   SCAR DEBRIDEMENT OF TOTAL KNEE  01/2020   SYNDESMOSIS REPAIR Right 02/08/2019   Procedure: SYNDESMOSIS REPAIR;  Surgeon: Jasmine Bucker, MD;  Location: [facility];  Service: Orthopedics;  Laterality: Right;   TEE WITHOUT CARDIOVERSION N/A 02/26/2019   Procedure: TRANSESOPHAGEAL ECHOCARDIOGRAM (TEE);  Surgeon: Jasmine Blinks, MD;  Location: [facility];  Service: Cardiovascular;  Laterality: N/A;   TOTAL  KNEE REVISION WITH SCAR DEBRIDEMENT/PATELLA REVISION WITH POLY EXCHANGE Right 01/24/2020   Procedure: TOTAL KNEE REVISION WITH SCAR DEBRIDEMENT/PATELLA REVISION WITH POLY EXCHANGE;  Surgeon: Jasmine Heinz, MD;  Location: [facility];  Service: Orthopedics;  Laterality: Right;   TUMOR REMOVAL     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   Patient Active Problem List   Diagnosis Date Noted   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   Anxiety 04/17/2020   History of GI  diverticular bleed 04/17/2020   Chest pain 04/17/2020   PAD (peripheral artery disease) (HCC) 04/05/2020   Ulcer of right leg (HCC) 04/05/2020   COPD (chronic obstructive pulmonary disease) (HCC) 04/05/2020   GERD (gastroesophageal reflux disease) 04/05/2020   Abscess of right knee 01/22/2020   Pressure injury of skin 02/28/2019   SOB (shortness of breath)    Gastrointestinal hemorrhage    Sepsis (HCC)    Symptomatic anemia 02/20/2019   Trimalleolar fracture of ankle, closed, right, initial encounter 02/07/2019   Multiple lung nodules 03/31/2014   Extrinsic asthma 03/27/2014   OSA on CPAP 03/27/2014    PCP: Jasmine Penton, MD  REFERRING PROVIDER: Danella Penton, MD  REFERRING DIAG: Above knee amputation of right lower extremity  THERAPY DIAG:  Gait difficulty  S/P AKA (above knee amputation) unilateral, right (HCC)  Muscle weakness (generalized)  Abnormal posture  Rationale for Evaluation and Treatment Rehabilitation  ONSET DATE: 12/02/20  SUBJECTIVE:  EVALUATION  PERTINENT HISTORY: Patient Profile:   Jasmine Morrow is a 77 y.o. female Chief Complaint  Patient presents with  Visit Follow Up  Doing well.   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 Cardiac cath 10/2011 showed completely normal coronary arteries, however she did have mild left ventricular dysfunction with anterior  wall motion abnormality. Left ventricular EF was 51%.  History of chickenpox  Hyperlipidemia  patient states she has never been diagnosed with this  Hypertension  Knee joint replacement by other means  Macular degeneration  Major depressive disorder, recurrent, mild (CMS-HCC)  Medicare annual wellness visit, initial 08/09/2019  7/21  Multiple thyroid nodules  Normal coronary arteries 09/06/2013  Normal coronary anatomy by cardiac catheterization 10/12/11  Osteoarthritis  Ovarian cyst  PONV (postoperative nausea and vomiting)  nausea  Postoperative urinary retention 12/08/2020  Rash on lips 12/21/2020  Seasonal allergies  Sinusitis, unspecified  Sleep apnea  On CPAP.  Slow transit constipation 12/10/2020  Trimalleolar fracture of ankle, closed, right, initial encounter 02/07/2019  Complicated by staph infection 1/21, 4 weeks IV Ancef, Menz  Ulcer   Past Surgical History:  Procedure Laterality Date  SALPINGO OOPHORECTOMY Bilateral 1993  Right total knee arthroplasty 10/11/2006  Dr Ernest Pine  Left total knee arthroplasty 10/12/2007  Dr Ernest Pine  COLONOSCOPY 07/30/2012  internal hemorrhoids, diverticulosis  ANKLE ARTHODESIS W/ ARTHROSCOPY Right 02/08/2019  03/14/2019 second surgery  COLONOSCOPY 02/22/2019  Blood in entire colon/Diverticulosis - Presumed diverticular bleed. No repeat recommended per TKT.  EGD 02/22/2019  Gastritis/gastric  ulcers/Hiatal hernia/Otherwise normal - no repeat recommended per TKT.  ORIF ANKLE FRACTURE Right 03/14/2019  Dr. Rosita Kea  REMOVAL HARDWARE ANKLE FOOT/TOES Right 11/12/2019  Dr. Rosita Kea  Right knee arthrotomy, irrigation and debridement of the right knee, polyethylene exchange 01/24/2020  Dr Ernest Pine  Removal of hardware (plates and screws) from the right ankle with irrigation, debridement, and placement of Stimulan antibiotic beads 04/17/2020  Dr Ernest Pine  Right knee arthrotomy, extensive irrigation debridement, removal of right total knee implants, and  placement of antibiotic impregnated polymethylmethacrylate cement spacer 04/17/2020  Dr Ernest Pine  AMPUTATION LEG ABOVE KNEE AKA Right 12/02/2020  Procedure: AMPUTATION, THIGH, THROUGH FEMUR, ANY LEVEL; Surgeon: Starr Lake, MD; Location: [facility]; Service: Orthopedics; Laterality: Right;  TRANSFER ADJACENT TISSUE LEG Right 12/02/2020  Procedure: ADJACENT TISSUE TRANSFER OR REARRANGEMENT, LEG; DEFECT 10 SQ CM OR LESS; Surgeon: Starr Lake, MD; Location: [facility]; Service: Orthopedics; Laterality: Right;  ASPIRATION/INJECTION MAJOR JOINT/BURSA KNEE Left 12/02/2020  Procedure: ARTHROCENTESIS, ASPIRATION AND/OR INJECTION, MAJOR JOINT OR BURSA, KNEE; WITHOUT ULTRASOUND GUIDANCE; Surgeon: Starr Lake, MD; Location: [facility]; Service: Orthopedics; Laterality: Left;  REMOVAL KNEE PROSTHESIS W/POSSIBLE PLACEMENT SPACER Left 01/18/2021  Procedure: REMOVAL OF PROSTHESIS, INCLUDING TOTAL KNEE PROSTHESIS, METHYLMETHACRYLATE WITH OR WITHOUT INSERTION OF SPACER, KNEE; Surgeon: Celine Mans, MD; Location: [facility]; Service: Orthopedics; Laterality: Left;  INSERTION NON-BIODEGRADABLE DRUG DELIVERY IMPLANT Left 01/18/2021  Procedure: INSERTION, KNEE, bioresorbable, biodegradable, NON-BIODEGRADABLE DRUG DELIVERY IMPLANT; Surgeon: Celine Mans, MD; Location: [facility]; Service: Orthopedics; Laterality: Left;  ABOVE KNEE LEG AMPUTATION  12/02/2020  APPENDECTOMY Est 1968  CESAREAN SECTION  CHOLECYSTECTOMY  FRACTURE SURGERY 1/21  HYSTERECTOMY partial  JOINT REPLACEMENT 2008 & 2009  TUBAL LIGATION 3/79   ALLERGIES: Allergies  Allergen Reactions  Singulair [Montelukast] Other (See Comments)  Gland swelling  Sulfa (Sulfonamide Antibiotics) Swelling  Oxycodone Dizziness and Other (See Comments)  Vancomycin Analogues Other (See Comments)  Ringing in ears  Avinza [Morphine] Itching  Darvocet A500 [Propoxyphene N-Acetaminophen] Nausea  Nickel  Rash  Ultracet [Tramadol-Acetaminophen] Rash  Vicodin [Hydrocodone-Acetaminophen] Vomiting  Vioxx [Rofecoxib] Other (See Comments)  GI   CURRENT MEDICATIONS: Current Outpatient Medications: acetaminophen (TYLENOL) 500 MG tablet, Take 1,000 mg by mouth 3 (three) times a day Twice daily per patient., PRN Not Currently Taking aspirin 81 MG EC tablet, Take 1 tablet (81 mg total) by mouth at bedtime, Taking calcium carbonate-vitamin D3 (CALTRATE 600+D) 600 mg-10 mcg (400 unit) tablet, Take 0.5 tablets by mouth 2 (two) times daily, Taking cetirizine (ZYRTEC) 10 mg capsule, Take 1 capsule (10 mg total) by mouth once daily, Taking escitalopram oxalate (LEXAPRO) 10 MG tablet, Take 1 tablet (10 mg total) by mouth once daily, Taking estradioL (ESTRACE) 0.01 % (0.1 mg/gram) vaginal cream, Place 2 g vaginally twice a week, Taking folic acid/multivit,iron,miner (MULTIVIT-IRON-MIN-FOLIC ACID ORAL), Take 1 tablet by mouth once daily, Taking gabapentin (NEURONTIN) 300 MG capsule, Take 1 capsule (300 mg total) by mouth 3 (three) times daily Phantom limb pain, Taking melatonin 3 mg tablet, Take 1 tablet (3 mg total) by mouth at bedtime, Taking multivitamin with iron (COMPLETE MULTIVITAMIN-MINERAL) tablet, Take 1 tablet by mouth, Taking multivitamin with minerals, EYE, (PRESERVISION AREDS 2) soft gel capsule, Take 1 capsule by mouth 2 (two) times daily, Taking nystatin (MYCOSTATIN) 100,000 unit/gram powder, Apply small amount topically twice daily for rash under breasts, PRN Not Currently Taking pantoprazole (PROTONIX) 40 MG DR tablet, Take 1 tablet (40 mg total) by mouth 2 (two) times daily. Please call to  schedule an appt. Thanks!, Taking polyethylene glycol (MIRALAX) powder, Take 17 g by mouth once daily Mix in 4-8ounces of fluid prior to taking., Taking sennosides-docusate (SENOKOT-S) 8.6-50 mg tablet, Take 1 tablet by mouth 2 (two) times daily For constipation, Taking lactose-reduced food (ENSURE PLUS ORAL),  Take 237 mLs by mouth 2 (two) times daily With breakfast and dinner  HPI   CLINICAL SUMMARY:  Patient post right AKA and working with prosthesis. 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 Able to do housework, improving  PAIN:  Are you having pain? Yes: NPRS scale: 5/10 Pain location: L knee Pain description: aching Aggravating factors: walking Relieving factors: rest  PRECAUTIONS: None  WEIGHT BEARING RESTRICTIONS No  FALLS:  Has patient fallen in last 6 months? No  LIVING ENVIRONMENT: Lives with: lives with their spouse Lives in: House/apartment Stairs: No Has following equipment at home: Dan Humphreys - 4 wheeled and Wheelchair (manual)  OCCUPATION: retired  PLOF: Requires assistive device for independence  PATIENT GOALS  improve standing tolerance/ pull up pants/ ambulate with SPC safely.    OBJECTIVE:   PATIENT SURVEYS:  FOTO initial 46/ goal 75.   11/2: 50  COGNITION:  Overall cognitive status: Within functional limits for tasks assessed     SENSATION: WFL  POSTURE: rounded shoulders, forward head, flexed trunk , and weight shift left  PALPATION: No tenderness along R distal residual limb.  Discussed phantom limb sensation.    LOWER EXTREMITY ROM:  B LE AROM WFL except L knee extension secondary to articulating spacer.  Did not assess L/R hip extension at this time.    LOWER EXTREMITY MMT:  MMT Right eval Left eval  Hip flexion 4/5 4/5  Hip extension    Hip abduction 4+/5 4+/5  Hip adduction 4+/5 4+/5  Hip internal rotation    Hip external rotation    Knee flexion N/A 4+/5  Knee extension N/A 4+/5  Ankle dorsiflexion  5/5  Ankle plantarflexion    Ankle inversion    Ankle eversion     (Blank rows = not tested)  GAIT: Distance walked: in gym/ //-bars Assistive device utilized: Environmental consultant - 2 wheeled Level of assistance: CGA Comments: Cuing for posture correction with mirror feedback.  Flexed posture/ heavy UE assist.     TUG: UE assist required to stand/ use of RW.  51.9 seconds.  8/20: TUG: 48.9 sec. (Requires UE assist on arm rests and extra time to initiate movement after standing).   TODAY'S TREATMENT:  12/14/2022  Subjective:  Pt reports she is doing well today. She says the swelling in her leg is still bothering her, especially after eating salty foods. She has done better wearing her prosthetic the last several weeks but has not been able to walk at home.   Objective:   There.ex.:  No charge   Nustep L5 10+ mins (0.5 miles) B UE/LE (consistent cadence/ no residual limb pain or tenderness reported)    Sit to stand transfers from blue mat table with min A    Gait training:    Amb. From blue mat table to Nustep with use of RW and focus on step through gait.  SBA/mod. I on R to ensure R knee mechanism locks out before wt. Bearing.  No verbal cuing required and consistent gait pattern/ step pattern.        Ambulation in hallway with RW: min A, 1x77 feet, 2x50 feet - pt reports UE fatigue is what limits her distance  PATIENT EDUCATION:  Education details: HEP/ gait and prosthetic training.  Person educated: Patient and Spouse Education method: Explanation, Demonstration, and Verbal cues Education comprehension: verbalized understanding, returned demonstration, and tactile cues required   HOME EXERCISE PROGRAM: Prone position hip stretches/ Standing marching at kitchen counter with w/c behind patient.      ASSESSMENT:  CLINICAL IMPRESSION:    Pt reports to PT with no pain in her residual limb. Today's session focused on improving her walking endurance with hte prosthetic limb. She was able to ambulate a max of 77 feet today with RW; she continues to be most limited in her walking distance due to UE fatigue, particularly her shoulders and wrists. Future sessions will focus on improving pt's standing tolerance and ability to fully weightbear on her prosthetic limb. Patient will continue to benefit  from skilled PT services to improve standing tolerance/ gait independence and ADL.  OBJECTIVE IMPAIRMENTS Abnormal gait, decreased activity tolerance, decreased balance, decreased endurance, decreased mobility, difficulty walking, decreased ROM, decreased strength, decreased safety awareness, impaired flexibility, improper body mechanics, prosthetic dependency , and pain.   ACTIVITY LIMITATIONS carrying, lifting, standing, squatting, stairs, transfers, bed mobility, bathing, toileting, dressing, hygiene/grooming, and locomotion level  PARTICIPATION LIMITATIONS: cleaning, laundry, driving, shopping, community activity, and yard work  PERSONAL FACTORS Past/current experiences are also affecting patient's functional outcome.   REHAB POTENTIAL: Good  CLINICAL DECISION MAKING: Evolving/moderate complexity  EVALUATION COMPLEXITY: High   GOALS: Goals reviewed with patient Yes  LONG TERM GOALS: Target date: 12/27/22  Pt. Able to tolerate standing 10 minutes while pulling up pants with mod. I safely while wearing prosthetic leg to improve daily activities.  Baseline:  limited standing tolerance Goal status: Partially met  2.  Pt. Will ambulate 200 feet with use of least assistive device and mod. I to promote safety with household tasks/ walking into bathroom.   Baseline:  amb. With RW and min. A for safety.  2/27: walking in clinic with SBA/mod. I for 100 feet with RW and use of prosthetic leg.  6/11: amb. With RW or rollator <100 feet with SBA for RW/ CGA for rollator. 7/18: 120' with SBA with RW/CGA for rollator.  10/16: limited standing/walking tolerance today secondary to limited prosthetic leg fitting.  Goal status: Not met  3.  Pt. Will ascend 10 stairs with use of single forearm crutch/ handrail and step to pattern with mod. I safely.  Baseline:  see above; 7/18: ascends 4 stairs with step to pattern, with B rails, and CGA. Goal status: Partially met  4.  Pt. Able to ambulate from car  into PT clinic with use of RW and mod. I safely to limited use of wheelchair to improve independence/ mobility out of house.    PLAN: PT FREQUENCY: 1-2x/week  PT DURATION: 6 weeks  PLANNED INTERVENTIONS: Therapeutic exercises, Therapeutic activity, Neuromuscular re-education, Balance training, Gait training, Patient/Family education, Self Care, Joint mobilization, Prosthetic training, DME instructions, and Manual therapy  PLAN FOR NEXT SESSION:  walking at home carryover, static balance activities in // bars to reinforce weightbearing on prosthetic side, functional reach, ambulation practice with RW  Cammie Mcgee, PT, DPT # [phone] Cena Benton, SPT 12/14/22, 11:24 AM

## 2022-12-19 ENCOUNTER — Encounter: Payer: Self-pay | Admitting: Physical Therapy

## 2022-12-19 ENCOUNTER — Ambulatory Visit: Payer: Medicare Other | Admitting: Physical Therapy

## 2022-12-19 DIAGNOSIS — Z89611 Acquired absence of right leg above knee: Secondary | ICD-10-CM

## 2022-12-19 DIAGNOSIS — R293 Abnormal posture: Secondary | ICD-10-CM

## 2022-12-19 DIAGNOSIS — R269 Unspecified abnormalities of gait and mobility: Secondary | ICD-10-CM | POA: Diagnosis not present

## 2022-12-19 DIAGNOSIS — M6281 Muscle weakness (generalized): Secondary | ICD-10-CM

## 2022-12-19 NOTE — Therapy (Signed)
OUTPATIENT PHYSICAL THERAPY LOWER EXTREMITY TREATMENT Physical Therapy Progress Note  Dates of reporting period  09/29/22   to   12/19/22   Patient Name: Jasmine Morrow MRN: [id number] DOB:03/27/1945, 77 y.o., female Today's Date: 12/19/2022   PT End of Session - 12/19/22 1043     Visit Number 90    Number of Visits 92    Date for PT Re-Evaluation 12/28/22    PT Start Time 1044    PT Stop Time 1139    PT Time Calculation (min) 55 min    Equipment Utilized During Treatment Gait belt    Activity Tolerance Patient tolerated treatment well    Behavior During Therapy WFL for tasks assessed/performed            Past Medical History:  Diagnosis Date   Anemia    Anxiety    Asthma    seasonal    GERD (gastroesophageal reflux disease)    H/O above knee amputation, 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   Hypertension    PONV (postoperative nausea and vomiting)    Seasonal allergies    Sleep apnea    does not uses CPAP machine anymore    Wheelchair dependent    able to self transfer   Past Surgical History:  Procedure Laterality Date   APPENDECTOMY     CATARACT EXTRACTION W/PHACO Right 02/28/2022   Procedure: CATARACT EXTRACTION PHACO AND INTRAOCULAR LENS PLACEMENT (IOC) RIGHT  4.82  00:30.9;  Surgeon: Nevada Crane, MD;  Location: [facility];  Service: Ophthalmology;  Laterality: Right;  sleep apnea   CATARACT EXTRACTION W/PHACO Left 03/14/2022   Procedure: CATARACT EXTRACTION PHACO AND INTRAOCULAR LENS PLACEMENT (IOC) LEFT;  Surgeon: Nevada Crane, MD;  Location: [facility];  Service: Ophthalmology;  Laterality: Left;  4.32 0:39.7   CESAREAN SECTION     CHOLECYSTECTOMY     DILATION AND CURETTAGE OF UTERUS     ESOPHAGOGASTRODUODENOSCOPY N/A 02/22/2019   Procedure: ESOPHAGOGASTRODUODENOSCOPY (EGD);  Surgeon: Toledo, Boykin Nearing, MD;  Location: [facility];  Service: Gastroenterology;  Laterality: N/A;   FLEXIBLE SIGMOIDOSCOPY N/A 02/22/2019   Procedure:  FLEXIBLE SIGMOIDOSCOPY;  Surgeon: Toledo, Boykin Nearing, MD;  Location: [facility];  Service: Gastroenterology;  Laterality: N/A;   HARDWARE REMOVAL Right 11/12/2019   Procedure: Right ankle hardware removal;  Surgeon: Kennedy Bucker, MD;  Location: [facility];  Service: Orthopedics;  Laterality: Right;   HARDWARE REMOVAL Right 04/17/2020   Procedure: HARDWARE REMOVAL FROM RIGHT ANKLE AND RIGHT KNEE; IMPLANT OF CEMENT SPACERS IN RIGHT KNEE;  Surgeon: Donato Heinz, MD;  Location: [facility];  Service: Orthopedics;  Laterality: Right;   JOINT REPLACEMENT     LEG AMPUTATION ABOVE KNEE Right 12/02/2020   [facility]   ORIF ANKLE FRACTURE Right 03/14/2019   Procedure: OPEN REDUCTION INTERNAL FIXATION (ORIF) ANKLE FRACTURE, MEDIAL MALLEOLUS;  Surgeon: Kennedy Bucker, MD;  Location: [facility];  Service: Orthopedics;  Laterality: Right;   ORIF ANKLE FRACTURE Right 02/08/2019   Procedure: OPEN REDUCTION INTERNAL FIXATION (ORIF) ANKLE FRACTURE, post malleolus;  Surgeon: Kennedy Bucker, MD;  Location: [facility];  Service: Orthopedics;  Laterality: Right;   PARTIAL HYSTERECTOMY  1993   REPLACEMENT TOTAL KNEE Bilateral 2008   2009   SCAR DEBRIDEMENT OF TOTAL KNEE  01/2020   SYNDESMOSIS REPAIR Right 02/08/2019   Procedure: SYNDESMOSIS REPAIR;  Surgeon: Kennedy Bucker, MD;  Location: [facility];  Service: Orthopedics;  Laterality: Right;   TEE WITHOUT CARDIOVERSION N/A 02/26/2019   Procedure: TRANSESOPHAGEAL ECHOCARDIOGRAM (TEE);  Surgeon: Lamar Blinks, MD;  Location: [facility];  Service: Cardiovascular;  Laterality: N/A;   TOTAL KNEE REVISION WITH SCAR DEBRIDEMENT/PATELLA REVISION WITH POLY EXCHANGE Right 01/24/2020   Procedure: TOTAL KNEE REVISION WITH SCAR DEBRIDEMENT/PATELLA REVISION WITH POLY EXCHANGE;  Surgeon: Donato Heinz, MD;  Location: [facility];  Service: Orthopedics;  Laterality: Right;   TUMOR REMOVAL     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   Patient Active Problem List   Diagnosis Date Noted   Chronic  infection of prosthetic knee (HCC) 04/17/2020   Anxiety 04/17/2020   History of GI diverticular bleed 04/17/2020   Chest pain 04/17/2020   PAD (peripheral artery disease) (HCC) 04/05/2020   Ulcer of right leg (HCC) 04/05/2020   COPD (chronic obstructive pulmonary disease) (HCC) 04/05/2020   GERD (gastroesophageal reflux disease) 04/05/2020   Abscess of right knee 01/22/2020   Pressure injury of skin 02/28/2019   SOB (shortness of breath)    Gastrointestinal hemorrhage    Sepsis (HCC)    Symptomatic anemia 02/20/2019   Trimalleolar fracture of ankle, closed, right, initial encounter 02/07/2019   Multiple lung nodules 03/31/2014   Extrinsic asthma 03/27/2014   OSA on CPAP 03/27/2014    PCP: Danella Penton, MD  REFERRING PROVIDER: Danella Penton, MD  REFERRING DIAG: Above knee amputation of right lower extremity  THERAPY DIAG:  Gait difficulty  S/P AKA (above knee amputation) unilateral, right (HCC)  Muscle weakness (generalized)  Abnormal posture  Rationale for Evaluation and Treatment Rehabilitation  ONSET DATE: 12/02/20  SUBJECTIVE:  EVALUATION  PERTINENT HISTORY: Patient Profile:   Jasmine Morrow is a 77 y.o. female Chief Complaint  Patient presents with  Visit Follow Up  Doing well.   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 Cardiac cath 10/2011 showed completely normal  coronary arteries, however she did have mild left ventricular dysfunction with anterior wall motion abnormality. Left ventricular EF was 51%.  History of chickenpox  Hyperlipidemia  patient states she has never been diagnosed with this  Hypertension  Knee joint replacement by other means  Macular degeneration  Major depressive disorder, recurrent, mild (CMS-HCC)  Medicare annual wellness visit, initial 08/09/2019  7/21  Multiple thyroid nodules  Normal coronary arteries 09/06/2013  Normal coronary anatomy by cardiac catheterization 10/12/11  Osteoarthritis  Ovarian cyst  PONV (postoperative nausea and vomiting)  nausea  Postoperative urinary retention 12/08/2020  Rash on lips 12/21/2020  Seasonal allergies  Sinusitis, unspecified  Sleep apnea  On CPAP.  Slow transit constipation 12/10/2020  Trimalleolar fracture of ankle, closed, right, initial encounter 02/07/2019  Complicated by staph infection 1/21, 4 weeks IV Ancef, Menz  Ulcer   Past Surgical History:  Procedure Laterality Date  SALPINGO OOPHORECTOMY Bilateral 1993  Right total knee arthroplasty 10/11/2006  Dr Ernest Pine  Left total knee arthroplasty 10/12/2007  Dr Ernest Pine  COLONOSCOPY 07/30/2012  internal hemorrhoids, diverticulosis  ANKLE ARTHODESIS W/ ARTHROSCOPY Right 02/08/2019  03/14/2019 second surgery  COLONOSCOPY 02/22/2019  Blood in entire colon/Diverticulosis - Presumed diverticular bleed. No repeat recommended per TKT.  EGD 02/22/2019  Gastritis/gastric ulcers/Hiatal hernia/Otherwise normal - no repeat recommended per TKT.  ORIF ANKLE FRACTURE Right 03/14/2019  Dr. Rosita Kea  REMOVAL HARDWARE ANKLE FOOT/TOES Right 11/12/2019  Dr. Rosita Kea  Right knee arthrotomy, irrigation and debridement of the right knee, polyethylene exchange 01/24/2020  Dr Ernest Pine  Removal of hardware (plates and screws) from the right ankle with irrigation, debridement, and placement of Stimulan antibiotic beads 04/17/2020  Dr Ernest Pine  Right knee  arthrotomy, extensive irrigation debridement, removal of right total knee implants, and placement of antibiotic impregnated polymethylmethacrylate cement spacer 04/17/2020  Dr Ernest Pine  AMPUTATION LEG ABOVE KNEE AKA Right 12/02/2020  Procedure: AMPUTATION, THIGH, THROUGH FEMUR, ANY LEVEL; Surgeon: Starr Lake, MD; Location: [facility]; Service: Orthopedics; Laterality: Right;  TRANSFER ADJACENT TISSUE LEG Right 12/02/2020  Procedure: ADJACENT TISSUE TRANSFER OR REARRANGEMENT, LEG; DEFECT 10 SQ CM OR LESS; Surgeon: Starr Lake, MD; Location: [facility]; Service: Orthopedics; Laterality: Right;  ASPIRATION/INJECTION MAJOR JOINT/BURSA KNEE Left 12/02/2020  Procedure: ARTHROCENTESIS, ASPIRATION AND/OR INJECTION, MAJOR JOINT OR BURSA, KNEE; WITHOUT ULTRASOUND GUIDANCE; Surgeon: Starr Lake, MD; Location: [facility]; Service: Orthopedics; Laterality: Left;  REMOVAL KNEE PROSTHESIS W/POSSIBLE PLACEMENT SPACER Left 01/18/2021  Procedure: REMOVAL OF PROSTHESIS, INCLUDING TOTAL KNEE PROSTHESIS, METHYLMETHACRYLATE WITH OR WITHOUT INSERTION OF SPACER, KNEE; Surgeon: Celine Mans, MD; Location: [facility]; Service: Orthopedics; Laterality: Left;  INSERTION NON-BIODEGRADABLE DRUG DELIVERY IMPLANT Left 01/18/2021  Procedure: INSERTION, KNEE, bioresorbable, biodegradable, NON-BIODEGRADABLE DRUG DELIVERY IMPLANT; Surgeon: Celine Mans, MD; Location: [facility]; Service: Orthopedics; Laterality: Left;  ABOVE KNEE LEG AMPUTATION  12/02/2020  APPENDECTOMY Est 1968  CESAREAN SECTION  CHOLECYSTECTOMY  FRACTURE SURGERY 1/21  HYSTERECTOMY partial  JOINT REPLACEMENT 2008 & 2009  TUBAL LIGATION 3/79   ALLERGIES: Allergies  Allergen Reactions  Singulair [Montelukast] Other (See Comments)  Gland swelling  Sulfa (Sulfonamide Antibiotics) Swelling  Oxycodone Dizziness and Other (See Comments)  Vancomycin Analogues Other (See Comments)  Ringing in ears   Avinza [Morphine] Itching  Darvocet A500 [Propoxyphene N-Acetaminophen] Nausea  Nickel Rash  Ultracet [Tramadol-Acetaminophen] Rash  Vicodin [Hydrocodone-Acetaminophen] Vomiting  Vioxx [Rofecoxib] Other (See Comments)  GI   CURRENT MEDICATIONS: Current Outpatient Medications: acetaminophen (TYLENOL) 500 MG tablet, Take 1,000 mg by mouth 3 (three) times a day Twice daily per patient., PRN Not Currently Taking aspirin 81 MG EC tablet, Take 1 tablet (81 mg total) by mouth at bedtime, Taking calcium carbonate-vitamin D3 (CALTRATE 600+D) 600 mg-10 mcg (400 unit) tablet, Take 0.5 tablets by mouth 2 (two) times daily, Taking cetirizine (ZYRTEC) 10 mg capsule, Take 1 capsule (10 mg total) by mouth once daily, Taking escitalopram oxalate (LEXAPRO) 10 MG tablet, Take 1 tablet (10 mg total) by mouth once daily, Taking estradioL (ESTRACE) 0.01 % (0.1 mg/gram) vaginal cream, Place 2 g vaginally twice a week, Taking folic acid/multivit,iron,miner (MULTIVIT-IRON-MIN-FOLIC ACID ORAL), Take 1 tablet by mouth once daily, Taking gabapentin (NEURONTIN) 300 MG capsule, Take 1 capsule (300 mg total) by mouth 3 (three) times daily Phantom limb pain, Taking melatonin 3 mg tablet, Take 1 tablet (3 mg total) by mouth at bedtime, Taking multivitamin with iron (COMPLETE MULTIVITAMIN-MINERAL) tablet, Take 1 tablet by mouth, Taking multivitamin with minerals, EYE, (PRESERVISION AREDS 2) soft gel capsule, Take 1 capsule by mouth 2 (two) times daily, Taking nystatin (MYCOSTATIN) 100,000 unit/gram powder, Apply small amount topically twice daily for rash under breasts, PRN Not Currently Taking pantoprazole (PROTONIX) 40  MG DR tablet, Take 1 tablet (40 mg total) by mouth 2 (two) times daily. Please call to schedule an appt. Thanks!, Taking polyethylene glycol (MIRALAX) powder, Take 17 g by mouth once daily Mix in 4-8ounces of fluid prior to taking., Taking sennosides-docusate (SENOKOT-S) 8.6-50 mg tablet, Take 1 tablet by  mouth 2 (two) times daily For constipation, Taking lactose-reduced food (ENSURE PLUS ORAL), Take 237 mLs by mouth 2 (two) times daily With breakfast and dinner  HPI   CLINICAL SUMMARY:  Patient post right AKA and working with prosthesis. 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 Able to do housework, improving  PAIN:  Are you having pain? Yes: NPRS scale: 5/10 Pain location: L knee Pain description: aching Aggravating factors: walking Relieving factors: rest  PRECAUTIONS: None  WEIGHT BEARING RESTRICTIONS No  FALLS:  Has patient fallen in last 6 months? No  LIVING ENVIRONMENT: Lives with: lives with their spouse Lives in: House/apartment Stairs: No Has following equipment at home: Dan Humphreys - 4 wheeled and Wheelchair (manual)  OCCUPATION: retired  PLOF: Requires assistive device for independence  PATIENT GOALS  improve standing tolerance/ pull up pants/ ambulate with SPC safely.    OBJECTIVE:   PATIENT SURVEYS:  FOTO initial 46/ goal 60.   11/2: 50  COGNITION:  Overall cognitive status: Within functional limits for tasks assessed     SENSATION: WFL  POSTURE: rounded shoulders, forward head, flexed trunk , and weight shift left  PALPATION: No tenderness along R distal residual limb.  Discussed phantom limb sensation.    LOWER EXTREMITY ROM:  B LE AROM WFL except L knee extension secondary to articulating spacer.  Did not assess L/R hip extension at this time.    LOWER EXTREMITY MMT:  MMT Right eval Left eval  Hip flexion 4/5 4/5  Hip extension    Hip abduction 4+/5 4+/5  Hip adduction 4+/5 4+/5  Hip internal rotation    Hip external rotation    Knee flexion N/A 4+/5  Knee extension N/A 4+/5  Ankle dorsiflexion  5/5  Ankle plantarflexion    Ankle inversion    Ankle eversion     (Blank rows = not tested)  GAIT: Distance walked: in gym/ //-bars Assistive device utilized: Environmental consultant - 2 wheeled Level of assistance:  CGA Comments: Cuing for posture correction with mirror feedback.  Flexed posture/ heavy UE assist.    TUG: UE assist required to stand/ use of RW.  51.9 seconds.  8/20: TUG: 48.9 sec. (Requires UE assist on arm rests and extra time to initiate movement after standing).   TODAY'S TREATMENT:  12/19/2022  Subjective:  Pt reports she is doing well today. She has done better wearing her prosthetic the last several weeks but has not been able to walk at home.  Pt. States her L knee has been hurting during transfers/ wt. Bearing.    Objective:   There.ex.:  No charge   Nustep L5 10+ mins (0.5 miles) B UE/LE (consistent cadence/ no residual limb pain or tenderness reported)    Sit to stand transfers from blue mat table with CGA/min A    Gait training:    Amb. From blue mat table to Nustep with use of RW and focus on step through gait.  SBA/mod. I on R to ensure R knee mechanism locks out before wt. Bearing.  No verbal cuing required and consistent gait pattern/ step pattern.        Ascend/ descend 4 stairs with step  to gait pattern and heavy B UE assist on handrails.  CGA for safety/ good R knee mechanism control.     Ambulation in hallway with RW: CGA/min A,  94 feet, 82 feet from hallway to car - pt reports UE fatigue/pain is what limits her distance  PATIENT EDUCATION:  Education details: HEP/ gait and prosthetic training.  Person educated: Patient and Spouse Education method: Explanation, Demonstration, and Verbal cues Education comprehension: verbalized understanding, returned demonstration, and tactile cues required   HOME EXERCISE PROGRAM: Prone position hip stretches/ Standing marching at kitchen counter with w/c behind patient.      ASSESSMENT:  CLINICAL IMPRESSION:    Pt reports to PT with no pain in her residual limb. Today's session focused on improving her walking endurance with the prosthetic limb. She was able to ambulate a max of 94 feet today with RW; she continues to  be most limited in her walking distance due to UE fatigue, particularly her shoulders and wrists. Future sessions will focus on improving pt's standing tolerance and ability to fully weightbear on her prosthetic limb. Patient will continue to benefit from skilled PT services to improve standing tolerance/ gait independence and ADL.  OBJECTIVE IMPAIRMENTS Abnormal gait, decreased activity tolerance, decreased balance, decreased endurance, decreased mobility, difficulty walking, decreased ROM, decreased strength, decreased safety awareness, impaired flexibility, improper body mechanics, prosthetic dependency , and pain.   ACTIVITY LIMITATIONS carrying, lifting, standing, squatting, stairs, transfers, bed mobility, bathing, toileting, dressing, hygiene/grooming, and locomotion level  PARTICIPATION LIMITATIONS: cleaning, laundry, driving, shopping, community activity, and yard work  PERSONAL FACTORS Past/current experiences are also affecting patient's functional outcome.   REHAB POTENTIAL: Good  CLINICAL DECISION MAKING: Evolving/moderate complexity  EVALUATION COMPLEXITY: High   GOALS: Goals reviewed with patient Yes  LONG TERM GOALS: Target date: 12/27/22  Pt. Able to tolerate standing 10 minutes while pulling up pants with mod. I safely while wearing prosthetic leg to improve daily activities.  Baseline:  limited standing tolerance Goal status: Partially met  2.  Pt. Will ambulate 200 feet with use of least assistive device and mod. I to promote safety with household tasks/ walking into bathroom.   Baseline:  amb. With RW and min. A for safety.  2/27: walking in clinic with SBA/mod. I for 100 feet with RW and use of prosthetic leg.  6/11: amb. With RW or rollator <100 feet with SBA for RW/ CGA for rollator. 7/18: 120' with SBA with RW/CGA for rollator.  10/16: limited standing/walking tolerance today secondary to limited prosthetic leg fitting.  Goal status: Not met  3.  Pt. Will ascend  10 stairs with use of single forearm crutch/ handrail and step to pattern with mod. I safely.  Baseline:  see above; 7/18: ascends 4 stairs with step to pattern, with B rails, and CGA. Goal status: Partially met  4.  Pt. Able to ambulate from car into PT clinic with use of RW and mod. I safely to limited use of wheelchair to improve independence/ mobility out of house.    PLAN: PT FREQUENCY: 1-2x/week  PT DURATION: 6 weeks  PLANNED INTERVENTIONS: Therapeutic exercises, Therapeutic activity, Neuromuscular re-education, Balance training, Gait training, Patient/Family education, Self Care, Joint mobilization, Prosthetic training, DME instructions, and Manual therapy  PLAN FOR NEXT SESSION:  walking at home carryover, static balance activities in // bars to reinforce weightbearing on prosthetic side, functional reach, ambulation practice with RW  Cammie Mcgee, PT, DPT # [phone] 12/19/22, 12:30 PM

## 2022-12-26 ENCOUNTER — Encounter: Payer: Self-pay | Admitting: Physical Therapy

## 2022-12-26 ENCOUNTER — Ambulatory Visit: Payer: Medicare Other | Admitting: Physical Therapy

## 2022-12-26 DIAGNOSIS — Z89611 Acquired absence of right leg above knee: Secondary | ICD-10-CM

## 2022-12-26 DIAGNOSIS — R269 Unspecified abnormalities of gait and mobility: Secondary | ICD-10-CM | POA: Diagnosis not present

## 2022-12-26 DIAGNOSIS — M6281 Muscle weakness (generalized): Secondary | ICD-10-CM

## 2022-12-26 DIAGNOSIS — R293 Abnormal posture: Secondary | ICD-10-CM

## 2022-12-26 NOTE — Therapy (Signed)
OUTPATIENT PHYSICAL THERAPY LOWER EXTREMITY TREATMENT  Patient Name: Jasmine Morrow MRN: [id number] DOB:11-Apr-1945, 77 y.o., female Today's Date: 12/26/2022   PT End of Session - 12/26/22 1026     Visit Number 91    Number of Visits 92    Date for PT Re-Evaluation 12/28/22    PT Start Time 1022    PT Stop Time 1119    PT Time Calculation (min) 57 min    Equipment Utilized During Treatment Gait belt    Activity Tolerance Patient tolerated treatment well    Behavior During Therapy WFL for tasks assessed/performed            Past Medical History:  Diagnosis Date   Anemia    Anxiety    Asthma    seasonal    GERD (gastroesophageal reflux disease)    H/O above knee amputation, 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   Hypertension    PONV (postoperative nausea and vomiting)    Seasonal allergies    Sleep apnea    does not uses CPAP machine anymore    Wheelchair dependent    able to self transfer   Past Surgical History:  Procedure Laterality Date   APPENDECTOMY     CATARACT EXTRACTION W/PHACO Right 02/28/2022   Procedure: CATARACT EXTRACTION PHACO AND INTRAOCULAR LENS PLACEMENT (IOC) RIGHT  4.82  00:30.9;  Surgeon: Nevada Crane, MD;  Location: [facility];  Service: Ophthalmology;  Laterality: Right;  sleep apnea   CATARACT EXTRACTION W/PHACO Left 03/14/2022   Procedure: CATARACT EXTRACTION PHACO AND INTRAOCULAR LENS PLACEMENT (IOC) LEFT;  Surgeon: Nevada Crane, MD;  Location: [facility];  Service: Ophthalmology;  Laterality: Left;  4.32 0:39.7   CESAREAN SECTION     CHOLECYSTECTOMY     DILATION AND CURETTAGE OF UTERUS     ESOPHAGOGASTRODUODENOSCOPY N/A 02/22/2019   Procedure: ESOPHAGOGASTRODUODENOSCOPY (EGD);  Surgeon: Toledo, Boykin Nearing, MD;  Location: [facility];  Service: Gastroenterology;  Laterality: N/A;   FLEXIBLE SIGMOIDOSCOPY N/A 02/22/2019   Procedure: FLEXIBLE SIGMOIDOSCOPY;  Surgeon: Toledo, Boykin Nearing, MD;  Location: [facility];  Service:  Gastroenterology;  Laterality: N/A;   HARDWARE REMOVAL Right 11/12/2019   Procedure: Right ankle hardware removal;  Surgeon: Kennedy Bucker, MD;  Location: [facility];  Service: Orthopedics;  Laterality: Right;   HARDWARE REMOVAL Right 04/17/2020   Procedure: HARDWARE REMOVAL FROM RIGHT ANKLE AND RIGHT KNEE; IMPLANT OF CEMENT SPACERS IN RIGHT KNEE;  Surgeon: Donato Heinz, MD;  Location: [facility];  Service: Orthopedics;  Laterality: Right;   JOINT REPLACEMENT     LEG AMPUTATION ABOVE KNEE Right 12/02/2020   [facility]   ORIF ANKLE FRACTURE Right 03/14/2019   Procedure: OPEN REDUCTION INTERNAL FIXATION (ORIF) ANKLE FRACTURE, MEDIAL MALLEOLUS;  Surgeon: Kennedy Bucker, MD;  Location: [facility];  Service: Orthopedics;  Laterality: Right;   ORIF ANKLE FRACTURE Right 02/08/2019   Procedure: OPEN REDUCTION INTERNAL FIXATION (ORIF) ANKLE FRACTURE, post malleolus;  Surgeon: Kennedy Bucker, MD;  Location: [facility];  Service: Orthopedics;  Laterality: Right;   PARTIAL HYSTERECTOMY  1993   REPLACEMENT TOTAL KNEE Bilateral 2008   2009   SCAR DEBRIDEMENT OF TOTAL KNEE  01/2020   SYNDESMOSIS REPAIR Right 02/08/2019   Procedure: SYNDESMOSIS REPAIR;  Surgeon: Kennedy Bucker, MD;  Location: [facility];  Service: Orthopedics;  Laterality: Right;   TEE WITHOUT CARDIOVERSION N/A 02/26/2019   Procedure: TRANSESOPHAGEAL ECHOCARDIOGRAM (TEE);  Surgeon: Lamar Blinks, MD;  Location: [facility];  Service: Cardiovascular;  Laterality: N/A;   TOTAL  KNEE REVISION WITH SCAR DEBRIDEMENT/PATELLA REVISION WITH POLY EXCHANGE Right 01/24/2020   Procedure: TOTAL KNEE REVISION WITH SCAR DEBRIDEMENT/PATELLA REVISION WITH POLY EXCHANGE;  Surgeon: Donato Heinz, MD;  Location: [facility];  Service: Orthopedics;  Laterality: Right;   TUMOR REMOVAL     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   Patient Active Problem List   Diagnosis Date Noted   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   Anxiety 04/17/2020   History of GI  diverticular bleed 04/17/2020   Chest pain 04/17/2020   PAD (peripheral artery disease) (HCC) 04/05/2020   Ulcer of right leg (HCC) 04/05/2020   COPD (chronic obstructive pulmonary disease) (HCC) 04/05/2020   GERD (gastroesophageal reflux disease) 04/05/2020   Abscess of right knee 01/22/2020   Pressure injury of skin 02/28/2019   SOB (shortness of breath)    Gastrointestinal hemorrhage    Sepsis (HCC)    Symptomatic anemia 02/20/2019   Trimalleolar fracture of ankle, closed, right, initial encounter 02/07/2019   Multiple lung nodules 03/31/2014   Extrinsic asthma 03/27/2014   OSA on CPAP 03/27/2014    PCP: Danella Penton, MD  REFERRING PROVIDER: Danella Penton, MD  REFERRING DIAG: Above knee amputation of right lower extremity  THERAPY DIAG:  Gait difficulty  S/P AKA (above knee amputation) unilateral, right (HCC)  Muscle weakness (generalized)  Abnormal posture  Rationale for Evaluation and Treatment Rehabilitation  ONSET DATE: 12/02/20  SUBJECTIVE:  EVALUATION  PERTINENT HISTORY: Patient Profile:   Jasmine Morrow is a 77 y.o. female Chief Complaint  Patient presents with  Visit Follow Up  Doing well.   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 Cardiac cath 10/2011 showed completely normal coronary arteries, however she did have mild left ventricular dysfunction with anterior  wall motion abnormality. Left ventricular EF was 51%.  History of chickenpox  Hyperlipidemia  patient states she has never been diagnosed with this  Hypertension  Knee joint replacement by other means  Macular degeneration  Major depressive disorder, recurrent, mild (CMS-HCC)  Medicare annual wellness visit, initial 08/09/2019  7/21  Multiple thyroid nodules  Normal coronary arteries 09/06/2013  Normal coronary anatomy by cardiac catheterization 10/12/11  Osteoarthritis  Ovarian cyst  PONV (postoperative nausea and vomiting)  nausea  Postoperative urinary retention 12/08/2020  Rash on lips 12/21/2020  Seasonal allergies  Sinusitis, unspecified  Sleep apnea  On CPAP.  Slow transit constipation 12/10/2020  Trimalleolar fracture of ankle, closed, right, initial encounter 02/07/2019  Complicated by staph infection 1/21, 4 weeks IV Ancef, Menz  Ulcer   Past Surgical History:  Procedure Laterality Date  SALPINGO OOPHORECTOMY Bilateral 1993  Right total knee arthroplasty 10/11/2006  Dr Ernest Pine  Left total knee arthroplasty 10/12/2007  Dr Ernest Pine  COLONOSCOPY 07/30/2012  internal hemorrhoids, diverticulosis  ANKLE ARTHODESIS W/ ARTHROSCOPY Right 02/08/2019  03/14/2019 second surgery  COLONOSCOPY 02/22/2019  Blood in entire colon/Diverticulosis - Presumed diverticular bleed. No repeat recommended per TKT.  EGD 02/22/2019  Gastritis/gastric  ulcers/Hiatal hernia/Otherwise normal - no repeat recommended per TKT.  ORIF ANKLE FRACTURE Right 03/14/2019  Dr. Rosita Kea  REMOVAL HARDWARE ANKLE FOOT/TOES Right 11/12/2019  Dr. Rosita Kea  Right knee arthrotomy, irrigation and debridement of the right knee, polyethylene exchange 01/24/2020  Dr Ernest Pine  Removal of hardware (plates and screws) from the right ankle with irrigation, debridement, and placement of Stimulan antibiotic beads 04/17/2020  Dr Ernest Pine  Right knee arthrotomy, extensive irrigation debridement, removal of right total knee implants, and  placement of antibiotic impregnated polymethylmethacrylate cement spacer 04/17/2020  Dr Ernest Pine  AMPUTATION LEG ABOVE KNEE AKA Right 12/02/2020  Procedure: AMPUTATION, THIGH, THROUGH FEMUR, ANY LEVEL; Surgeon: Starr Lake, MD; Location: [facility]; Service: Orthopedics; Laterality: Right;  TRANSFER ADJACENT TISSUE LEG Right 12/02/2020  Procedure: ADJACENT TISSUE TRANSFER OR REARRANGEMENT, LEG; DEFECT 10 SQ CM OR LESS; Surgeon: Starr Lake, MD; Location: [facility]; Service: Orthopedics; Laterality: Right;  ASPIRATION/INJECTION MAJOR JOINT/BURSA KNEE Left 12/02/2020  Procedure: ARTHROCENTESIS, ASPIRATION AND/OR INJECTION, MAJOR JOINT OR BURSA, KNEE; WITHOUT ULTRASOUND GUIDANCE; Surgeon: Starr Lake, MD; Location: [facility]; Service: Orthopedics; Laterality: Left;  REMOVAL KNEE PROSTHESIS W/POSSIBLE PLACEMENT SPACER Left 01/18/2021  Procedure: REMOVAL OF PROSTHESIS, INCLUDING TOTAL KNEE PROSTHESIS, METHYLMETHACRYLATE WITH OR WITHOUT INSERTION OF SPACER, KNEE; Surgeon: Celine Mans, MD; Location: [facility]; Service: Orthopedics; Laterality: Left;  INSERTION NON-BIODEGRADABLE DRUG DELIVERY IMPLANT Left 01/18/2021  Procedure: INSERTION, KNEE, bioresorbable, biodegradable, NON-BIODEGRADABLE DRUG DELIVERY IMPLANT; Surgeon: Celine Mans, MD; Location: [facility]; Service: Orthopedics; Laterality: Left;  ABOVE KNEE LEG AMPUTATION  12/02/2020  APPENDECTOMY Est 1968  CESAREAN SECTION  CHOLECYSTECTOMY  FRACTURE SURGERY 1/21  HYSTERECTOMY partial  JOINT REPLACEMENT 2008 & 2009  TUBAL LIGATION 3/79   ALLERGIES: Allergies  Allergen Reactions  Singulair [Montelukast] Other (See Comments)  Gland swelling  Sulfa (Sulfonamide Antibiotics) Swelling  Oxycodone Dizziness and Other (See Comments)  Vancomycin Analogues Other (See Comments)  Ringing in ears  Avinza [Morphine] Itching  Darvocet A500 [Propoxyphene N-Acetaminophen] Nausea  Nickel  Rash  Ultracet [Tramadol-Acetaminophen] Rash  Vicodin [Hydrocodone-Acetaminophen] Vomiting  Vioxx [Rofecoxib] Other (See Comments)  GI   CURRENT MEDICATIONS: Current Outpatient Medications: acetaminophen (TYLENOL) 500 MG tablet, Take 1,000 mg by mouth 3 (three) times a day Twice daily per patient., PRN Not Currently Taking aspirin 81 MG EC tablet, Take 1 tablet (81 mg total) by mouth at bedtime, Taking calcium carbonate-vitamin D3 (CALTRATE 600+D) 600 mg-10 mcg (400 unit) tablet, Take 0.5 tablets by mouth 2 (two) times daily, Taking cetirizine (ZYRTEC) 10 mg capsule, Take 1 capsule (10 mg total) by mouth once daily, Taking escitalopram oxalate (LEXAPRO) 10 MG tablet, Take 1 tablet (10 mg total) by mouth once daily, Taking estradioL (ESTRACE) 0.01 % (0.1 mg/gram) vaginal cream, Place 2 g vaginally twice a week, Taking folic acid/multivit,iron,miner (MULTIVIT-IRON-MIN-FOLIC ACID ORAL), Take 1 tablet by mouth once daily, Taking gabapentin (NEURONTIN) 300 MG capsule, Take 1 capsule (300 mg total) by mouth 3 (three) times daily Phantom limb pain, Taking melatonin 3 mg tablet, Take 1 tablet (3 mg total) by mouth at bedtime, Taking multivitamin with iron (COMPLETE MULTIVITAMIN-MINERAL) tablet, Take 1 tablet by mouth, Taking multivitamin with minerals, EYE, (PRESERVISION AREDS 2) soft gel capsule, Take 1 capsule by mouth 2 (two) times daily, Taking nystatin (MYCOSTATIN) 100,000 unit/gram powder, Apply small amount topically twice daily for rash under breasts, PRN Not Currently Taking pantoprazole (PROTONIX) 40 MG DR tablet, Take 1 tablet (40 mg total) by mouth 2 (two) times daily. Please call to  schedule an appt. Thanks!, Taking polyethylene glycol (MIRALAX) powder, Take 17 g by mouth once daily Mix in 4-8ounces of fluid prior to taking., Taking sennosides-docusate (SENOKOT-S) 8.6-50 mg tablet, Take 1 tablet by mouth 2 (two) times daily For constipation, Taking lactose-reduced food (ENSURE PLUS ORAL),  Take 237 mLs by mouth 2 (two) times daily With breakfast and dinner  HPI   CLINICAL SUMMARY:  Patient post right AKA and working with prosthesis. 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 Able to do housework, improving  PAIN:  Are you having pain? Yes: NPRS scale: 5/10 Pain location: L knee Pain description: aching Aggravating factors: walking Relieving factors: rest  PRECAUTIONS: None  WEIGHT BEARING RESTRICTIONS No  FALLS:  Has patient fallen in last 6 months? No  LIVING ENVIRONMENT: Lives with: lives with their spouse Lives in: House/apartment Stairs: No Has following equipment at home: Dan Humphreys - 4 wheeled and Wheelchair (manual)  OCCUPATION: retired  PLOF: Requires assistive device for independence  PATIENT GOALS  improve standing tolerance/ pull up pants/ ambulate with SPC safely.    OBJECTIVE:   PATIENT SURVEYS:  FOTO initial 46/ goal 17.   11/2: 50  COGNITION:  Overall cognitive status: Within functional limits for tasks assessed     SENSATION: WFL  POSTURE: rounded shoulders, forward head, flexed trunk , and weight shift left  PALPATION: No tenderness along R distal residual limb.  Discussed phantom limb sensation.    LOWER EXTREMITY ROM:  B LE AROM WFL except L knee extension secondary to articulating spacer.  Did not assess L/R hip extension at this time.    LOWER EXTREMITY MMT:  MMT Right eval Left eval  Hip flexion 4/5 4/5  Hip extension    Hip abduction 4+/5 4+/5  Hip adduction 4+/5 4+/5  Hip internal rotation    Hip external rotation    Knee flexion N/A 4+/5  Knee extension N/A 4+/5  Ankle dorsiflexion  5/5  Ankle plantarflexion    Ankle inversion    Ankle eversion     (Blank rows = not tested)  GAIT: Distance walked: in gym/ //-bars Assistive device utilized: Environmental consultant - 2 wheeled Level of assistance: CGA Comments: Cuing for posture correction with mirror feedback.  Flexed posture/ heavy UE assist.     TUG: UE assist required to stand/ use of RW.  51.9 seconds.  8/20: TUG: 48.9 sec. (Requires UE assist on arm rests and extra time to initiate movement after standing).   TODAY'S TREATMENT:  12/26/2022  Subjective:  Pt reports she is doing well today. She has done better wearing her prosthetic the last several weeks but has not been able to walk at home.  Pt. States her L knee has been hurting at night and with activity.    Objective:   There.ex.:  No charge   Nustep L5 10+ mins (0.5 miles) B UE/LE (consistent cadence/ no residual limb pain or tenderness reported)   Neuro.mm.:   Sit to stand transfers from blue mat table with SBA/CGA for safety (varying heights of mat table).      Functional reaching with R/L UE at hospital table with cones in front of blue mat table.  CGA for safety and cuing to increase R LE weight bearing/ upright posture.     Gait training:    Amb. From blue mat table to Nustep with use of RW and focus on step through gait.  SBA/mod. I on R to ensure R knee mechanism locks out  before wt. Bearing.  No verbal cuing required and consistent gait pattern/ step pattern.        Ambulation in gym with RW: CGA,  68 feet, 70 feet from blue mat to waiting room - pt reports UE fatigue/pain is what limits her distance  PATIENT EDUCATION:  Education details: HEP/ gait and prosthetic training.  Person educated: Patient and Spouse Education method: Explanation, Demonstration, and Verbal cues Education comprehension: verbalized understanding, returned demonstration, and tactile cues required   HOME EXERCISE PROGRAM: Prone position hip stretches/ Standing marching at kitchen counter with w/c behind patient.      ASSESSMENT:  CLINICAL IMPRESSION:    Pt reports to PT with no pain in her residual limb. Today's session focused on upright posture and proper wt. Shifting to improve balance.  Good technique with sit to stands and initiating gait with use of RW.  Pt.continues to  be most limited in her walking distance due to UE fatigue, particularly her shoulders and wrists. Future sessions will focus on improving pt's standing tolerance and ability to fully weightbear on her prosthetic limb. Patient will continue to benefit from skilled PT services to improve standing tolerance/ gait independence and ADL.  OBJECTIVE IMPAIRMENTS Abnormal gait, decreased activity tolerance, decreased balance, decreased endurance, decreased mobility, difficulty walking, decreased ROM, decreased strength, decreased safety awareness, impaired flexibility, improper body mechanics, prosthetic dependency , and pain.   ACTIVITY LIMITATIONS carrying, lifting, standing, squatting, stairs, transfers, bed mobility, bathing, toileting, dressing, hygiene/grooming, and locomotion level  PARTICIPATION LIMITATIONS: cleaning, laundry, driving, shopping, community activity, and yard work  PERSONAL FACTORS Past/current experiences are also affecting patient's functional outcome.   REHAB POTENTIAL: Good  CLINICAL DECISION MAKING: Evolving/moderate complexity  EVALUATION COMPLEXITY: High   GOALS: Goals reviewed with patient Yes  LONG TERM GOALS: Target date: 12/27/22  Pt. Able to tolerate standing 10 minutes while pulling up pants with mod. I safely while wearing prosthetic leg to improve daily activities.  Baseline:  limited standing tolerance Goal status: Partially met  2.  Pt. Will ambulate 200 feet with use of least assistive device and mod. I to promote safety with household tasks/ walking into bathroom.   Baseline:  amb. With RW and min. A for safety.  2/27: walking in clinic with SBA/mod. I for 100 feet with RW and use of prosthetic leg.  6/11: amb. With RW or rollator <100 feet with SBA for RW/ CGA for rollator. 7/18: 120' with SBA with RW/CGA for rollator.  10/16: limited standing/walking tolerance today secondary to limited prosthetic leg fitting.  Goal status: Not met  3.  Pt. Will ascend  10 stairs with use of single forearm crutch/ handrail and step to pattern with mod. I safely.  Baseline:  see above; 7/18: ascends 4 stairs with step to pattern, with B rails, and CGA. Goal status: Partially met  4.  Pt. Able to ambulate from car into PT clinic with use of RW and mod. I safely to limited use of wheelchair to improve independence/ mobility out of house.    PLAN: PT FREQUENCY: 1-2x/week  PT DURATION: 6 weeks  PLANNED INTERVENTIONS: Therapeutic exercises, Therapeutic activity, Neuromuscular re-education, Balance training, Gait training, Patient/Family education, Self Care, Joint mobilization, Prosthetic training, DME instructions, and Manual therapy  PLAN FOR NEXT SESSION:  walking at home carryover, static balance activities in // bars to reinforce weightbearing on prosthetic side, functional reach, ambulation practice with RW.   CHECK GOALS/ RECERTIFICATION  Cammie Mcgee, PT, DPT # [phone] 12/26/22, 11:44  AM

## 2022-12-28 ENCOUNTER — Ambulatory Visit: Payer: Medicare Other | Admitting: Physical Therapy

## 2023-01-04 ENCOUNTER — Ambulatory Visit: Payer: Medicare Other | Admitting: Physical Therapy

## 2023-01-11 ENCOUNTER — Encounter: Payer: Medicare Other | Admitting: Physical Therapy

## 2023-01-18 ENCOUNTER — Encounter: Payer: Medicare Other | Admitting: Physical Therapy

## 2023-04-01 DEATH — deceased

## 2023-04-20 ENCOUNTER — Other Ambulatory Visit: Payer: Self-pay | Admitting: Physician Assistant

## 2023-04-20 ENCOUNTER — Ambulatory Visit
Admission: RE | Admit: 2023-04-20 | Discharge: 2023-04-20 | Disposition: A | Discharge status: Home / Self Care | Source: Ambulatory Visit | Attending: Physician Assistant | Admitting: Physician Assistant

## 2023-04-20 DIAGNOSIS — M7989 Other specified soft tissue disorders: Secondary | ICD-10-CM

## 2023-04-20 DIAGNOSIS — R1032 Left lower quadrant pain: Secondary | ICD-10-CM

## 2023-09-05 ENCOUNTER — Other Ambulatory Visit: Payer: Self-pay | Admitting: Internal Medicine

## 2023-09-05 DIAGNOSIS — Z1231 Encounter for screening mammogram for malignant neoplasm of breast: Secondary | ICD-10-CM

## 2023-09-18 ENCOUNTER — Ambulatory Visit
Admission: RE | Admit: 2023-09-18 | Discharge: 2023-09-18 | Disposition: A | Discharge status: Home / Self Care | Source: Ambulatory Visit | Attending: Internal Medicine | Admitting: Internal Medicine

## 2023-09-18 DIAGNOSIS — Z1231 Encounter for screening mammogram for malignant neoplasm of breast: Secondary | ICD-10-CM | POA: Insufficient documentation
